# Patient Record
Sex: Male | Born: 1955
Health system: Southern US, Community
[De-identification: ages and names within clinical notes are randomized; demographics above are authoritative.]

## PROBLEM LIST (undated history)

## (undated) DIAGNOSIS — I10 Essential (primary) hypertension: Secondary | ICD-10-CM

## (undated) DIAGNOSIS — K5090 Crohn's disease, unspecified, without complications: Secondary | ICD-10-CM

## (undated) DIAGNOSIS — Z932 Ileostomy status: Secondary | ICD-10-CM

## (undated) DIAGNOSIS — Z9289 Personal history of other medical treatment: Secondary | ICD-10-CM

## (undated) DIAGNOSIS — N189 Chronic kidney disease, unspecified: Secondary | ICD-10-CM

## (undated) DIAGNOSIS — D6859 Other primary thrombophilia: Secondary | ICD-10-CM

## (undated) DIAGNOSIS — K610 Anal abscess: Secondary | ICD-10-CM

## (undated) DIAGNOSIS — H9190 Unspecified hearing loss, unspecified ear: Secondary | ICD-10-CM

## (undated) DIAGNOSIS — I8290 Acute embolism and thrombosis of unspecified vein: Secondary | ICD-10-CM

## (undated) DIAGNOSIS — K922 Gastrointestinal hemorrhage, unspecified: Secondary | ICD-10-CM

## (undated) DIAGNOSIS — I739 Peripheral vascular disease, unspecified: Secondary | ICD-10-CM

## (undated) DIAGNOSIS — D649 Anemia, unspecified: Secondary | ICD-10-CM

## (undated) DIAGNOSIS — E875 Hyperkalemia: Secondary | ICD-10-CM

## (undated) HISTORY — DX: Peripheral vascular disease, unspecified: I73.9

## (undated) HISTORY — DX: Anal abscess: K61.0

## (undated) HISTORY — DX: Gastrointestinal hemorrhage, unspecified: K92.2

## (undated) HISTORY — DX: Anemia, unspecified: D64.9

## (undated) HISTORY — DX: Essential (primary) hypertension: I10

## (undated) HISTORY — DX: Crohn's disease, unspecified, without complications: K50.90

---

## 1999-08-08 DIAGNOSIS — K610 Anal abscess: Secondary | ICD-10-CM

## 1999-08-08 HISTORY — DX: Anal abscess: K61.0

## 2000-06-07 ENCOUNTER — Emergency Department (HOSPITAL_COMMUNITY): Admission: EM | Admit: 2000-06-07 | Discharge: 2000-06-07 | Payer: Self-pay | Admitting: Unknown Physician Specialty

## 2000-08-07 HISTORY — PX: OTHER SURGICAL HISTORY: SHX169

## 2000-08-29 ENCOUNTER — Encounter: Payer: Self-pay | Admitting: Internal Medicine

## 2000-12-02 ENCOUNTER — Emergency Department (HOSPITAL_COMMUNITY): Admission: EM | Admit: 2000-12-02 | Discharge: 2000-12-02 | Payer: Self-pay | Admitting: Emergency Medicine

## 2000-12-25 ENCOUNTER — Ambulatory Visit (HOSPITAL_BASED_OUTPATIENT_CLINIC_OR_DEPARTMENT_OTHER): Admission: RE | Admit: 2000-12-25 | Discharge: 2000-12-25 | Payer: Self-pay | Admitting: General Surgery

## 2001-01-09 ENCOUNTER — Inpatient Hospital Stay (HOSPITAL_COMMUNITY): Admission: EM | Admit: 2001-01-09 | Discharge: 2001-01-23 | Payer: Self-pay | Admitting: Emergency Medicine

## 2001-01-09 ENCOUNTER — Encounter: Payer: Self-pay | Admitting: General Surgery

## 2001-01-09 ENCOUNTER — Encounter: Payer: Self-pay | Admitting: Emergency Medicine

## 2001-01-10 ENCOUNTER — Encounter: Payer: Self-pay | Admitting: General Surgery

## 2001-01-11 ENCOUNTER — Encounter: Payer: Self-pay | Admitting: General Surgery

## 2001-01-16 ENCOUNTER — Encounter: Payer: Self-pay | Admitting: General Surgery

## 2001-01-21 ENCOUNTER — Encounter: Payer: Self-pay | Admitting: General Surgery

## 2001-04-26 ENCOUNTER — Ambulatory Visit (HOSPITAL_COMMUNITY): Admission: RE | Admit: 2001-04-26 | Discharge: 2001-04-26 | Payer: Self-pay | Admitting: Gastroenterology

## 2001-04-26 ENCOUNTER — Encounter: Payer: Self-pay | Admitting: Gastroenterology

## 2001-08-25 ENCOUNTER — Encounter (INDEPENDENT_AMBULATORY_CARE_PROVIDER_SITE_OTHER): Payer: Self-pay | Admitting: Specialist

## 2001-08-25 ENCOUNTER — Inpatient Hospital Stay (HOSPITAL_COMMUNITY): Admission: EM | Admit: 2001-08-25 | Discharge: 2001-09-06 | Payer: Self-pay

## 2001-08-25 ENCOUNTER — Encounter: Payer: Self-pay | Admitting: Emergency Medicine

## 2001-08-27 ENCOUNTER — Encounter: Payer: Self-pay | Admitting: Emergency Medicine

## 2001-08-29 HISTORY — PX: HEMICOLECTOMY: SHX854

## 2004-12-27 ENCOUNTER — Encounter: Payer: Self-pay | Admitting: Internal Medicine

## 2004-12-27 LAB — CONVERTED CEMR LAB
Cholesterol: 247 mg/dL
Triglyceride fasting, serum: 153 mg/dL

## 2005-01-12 ENCOUNTER — Encounter: Payer: Self-pay | Admitting: Internal Medicine

## 2006-02-12 ENCOUNTER — Encounter: Payer: Self-pay | Admitting: Internal Medicine

## 2006-02-12 LAB — CONVERTED CEMR LAB
Cholesterol: 272 mg/dL
Triglyceride fasting, serum: 147 mg/dL

## 2008-02-26 ENCOUNTER — Encounter: Payer: Self-pay | Admitting: Internal Medicine

## 2008-02-26 LAB — CONVERTED CEMR LAB
ALT: 29 units/L
Cholesterol: 209 mg/dL
Creatinine, Ser: 1.02 mg/dL
Platelets: 309 10*3/uL
Triglycerides: 167 mg/dL
WBC, blood: 5.6 10*3/uL

## 2009-03-02 ENCOUNTER — Ambulatory Visit: Payer: Self-pay | Admitting: Internal Medicine

## 2009-03-02 DIAGNOSIS — R10814 Left lower quadrant abdominal tenderness: Secondary | ICD-10-CM

## 2009-03-02 DIAGNOSIS — I10 Essential (primary) hypertension: Secondary | ICD-10-CM | POA: Insufficient documentation

## 2009-03-02 DIAGNOSIS — H9190 Unspecified hearing loss, unspecified ear: Secondary | ICD-10-CM | POA: Insufficient documentation

## 2009-03-02 LAB — CONVERTED CEMR LAB
ALT: 18 units/L (ref 0–53)
AST: 13 units/L (ref 0–37)
Albumin: 2.9 g/dL — ABNORMAL LOW (ref 3.5–5.2)
Alkaline Phosphatase: 85 units/L (ref 39–117)
BUN: 14 mg/dL (ref 6–23)
Basophils Absolute: 0 10*3/uL (ref 0.0–0.1)
Basophils Relative: 0.1 % (ref 0.0–3.0)
Bilirubin, Direct: 0.1 mg/dL (ref 0.0–0.3)
CO2: 32 meq/L (ref 19–32)
Calcium: 8.7 mg/dL (ref 8.4–10.5)
Chloride: 91 meq/L — ABNORMAL LOW (ref 96–112)
Creatinine, Ser: 1 mg/dL (ref 0.4–1.5)
Eosinophils Absolute: 0.1 10*3/uL (ref 0.0–0.7)
Eosinophils Relative: 1.1 % (ref 0.0–5.0)
GFR calc non Af Amer: 101.43 mL/min (ref >60)
Glucose, Bld: 105 mg/dL — ABNORMAL HIGH (ref 70–99)
HCT: 39.6 % (ref 39.0–52.0)
Hemoglobin: 13.5 g/dL (ref 13.0–17.0)
Lymphocytes Relative: 17.8 % (ref 12.0–46.0)
Lymphs Abs: 1.5 10*3/uL (ref 0.7–4.0)
MCHC: 34.2 g/dL (ref 30.0–36.0)
MCV: 94.5 fL (ref 78.0–100.0)
Monocytes Absolute: 1.5 10*3/uL — ABNORMAL HIGH (ref 0.1–1.0)
Monocytes Relative: 18 % — ABNORMAL HIGH (ref 3.0–12.0)
Neutro Abs: 5.3 10*3/uL (ref 1.4–7.7)
Neutrophils Relative %: 63 % (ref 43.0–77.0)
Platelets: 670 10*3/uL — ABNORMAL HIGH (ref 150.0–400.0)
Potassium: 3.9 meq/L (ref 3.5–5.1)
RBC: 4.19 M/uL — ABNORMAL LOW (ref 4.22–5.81)
RDW: 11.9 % (ref 11.5–14.6)
Sodium: 132 meq/L — ABNORMAL LOW (ref 135–145)
Total Bilirubin: 0.7 mg/dL (ref 0.3–1.2)
Total Protein: 6.7 g/dL (ref 6.0–8.3)
Vitamin B-12: 577 pg/mL (ref 211–911)
WBC: 8.4 10*3/uL (ref 4.5–10.5)

## 2009-03-03 ENCOUNTER — Telehealth: Payer: Self-pay | Admitting: Internal Medicine

## 2009-03-04 ENCOUNTER — Encounter (INDEPENDENT_AMBULATORY_CARE_PROVIDER_SITE_OTHER): Payer: Self-pay | Admitting: *Deleted

## 2009-03-09 ENCOUNTER — Telehealth: Payer: Self-pay | Admitting: Internal Medicine

## 2009-03-19 ENCOUNTER — Ambulatory Visit: Payer: Self-pay | Admitting: Gastroenterology

## 2009-03-19 ENCOUNTER — Encounter (INDEPENDENT_AMBULATORY_CARE_PROVIDER_SITE_OTHER): Payer: Self-pay | Admitting: *Deleted

## 2009-03-19 DIAGNOSIS — K5010 Crohn's disease of large intestine without complications: Secondary | ICD-10-CM | POA: Insufficient documentation

## 2009-03-30 ENCOUNTER — Telehealth: Payer: Self-pay | Admitting: Gastroenterology

## 2009-04-06 ENCOUNTER — Ambulatory Visit: Payer: Self-pay | Admitting: Internal Medicine

## 2009-04-08 ENCOUNTER — Encounter: Payer: Self-pay | Admitting: Gastroenterology

## 2009-04-08 ENCOUNTER — Ambulatory Visit: Payer: Self-pay | Admitting: Gastroenterology

## 2009-04-14 ENCOUNTER — Encounter: Payer: Self-pay | Admitting: Gastroenterology

## 2009-05-03 ENCOUNTER — Telehealth: Payer: Self-pay | Admitting: Gastroenterology

## 2009-05-06 ENCOUNTER — Ambulatory Visit: Payer: Self-pay | Admitting: Gastroenterology

## 2009-05-06 DIAGNOSIS — D126 Benign neoplasm of colon, unspecified: Secondary | ICD-10-CM

## 2009-05-06 LAB — CONVERTED CEMR LAB
Basophils Absolute: 0.1 10*3/uL (ref 0.0–0.1)
Bilirubin, Direct: 0.1 mg/dL (ref 0.0–0.3)
Eosinophils Absolute: 0.2 10*3/uL (ref 0.0–0.7)
Eosinophils Relative: 2.6 % (ref 0.0–5.0)
HCT: 31.2 % — ABNORMAL LOW (ref 39.0–52.0)
Hemoglobin: 10.6 g/dL — ABNORMAL LOW (ref 13.0–17.0)
MCHC: 33.9 g/dL (ref 30.0–36.0)
Monocytes Absolute: 1.3 10*3/uL — ABNORMAL HIGH (ref 0.1–1.0)
Neutrophils Relative %: 58.4 % (ref 43.0–77.0)
Platelets: 743 10*3/uL — ABNORMAL HIGH (ref 150.0–400.0)
RBC: 3.49 M/uL — ABNORMAL LOW (ref 4.22–5.81)
RDW: 13.8 % (ref 11.5–14.6)
Total Bilirubin: 0.9 mg/dL (ref 0.3–1.2)
WBC: 7.3 10*3/uL (ref 4.5–10.5)

## 2009-05-10 ENCOUNTER — Encounter (INDEPENDENT_AMBULATORY_CARE_PROVIDER_SITE_OTHER): Payer: Self-pay | Admitting: *Deleted

## 2009-05-10 ENCOUNTER — Ambulatory Visit: Payer: Self-pay | Admitting: Internal Medicine

## 2009-05-11 ENCOUNTER — Encounter: Payer: Self-pay | Admitting: Internal Medicine

## 2009-05-13 ENCOUNTER — Telehealth: Payer: Self-pay | Admitting: Gastroenterology

## 2009-05-13 LAB — CONVERTED CEMR LAB
Basophils Absolute: 0.1 10*3/uL (ref 0.0–0.1)
Eosinophils Relative: 6 % — ABNORMAL HIGH (ref 0.0–5.0)
Lymphs Abs: 1 10*3/uL (ref 0.7–4.0)
MCV: 88.3 fL (ref 78.0–100.0)
Monocytes Relative: 31 % — ABNORMAL HIGH (ref 3.0–12.0)
Uric Acid, Serum: 8.3 mg/dL — ABNORMAL HIGH (ref 4.0–7.8)
WBC: 2.7 10*3/uL — ABNORMAL LOW (ref 4.5–10.5)

## 2009-05-17 ENCOUNTER — Ambulatory Visit: Payer: Self-pay | Admitting: Gastroenterology

## 2009-05-17 LAB — CONVERTED CEMR LAB
Basophils Absolute: 0.1 10*3/uL (ref 0.0–0.1)
Basophils Relative: 0.8 % (ref 0.0–3.0)
Bilirubin, Direct: 0.1 mg/dL (ref 0.0–0.3)
Eosinophils Relative: 2.7 % (ref 0.0–5.0)
Hemoglobin: 10.4 g/dL — ABNORMAL LOW (ref 13.0–17.0)
Lymphocytes Relative: 19.9 % (ref 12.0–46.0)
MCV: 88 fL (ref 78.0–100.0)
Neutro Abs: 4.8 10*3/uL (ref 1.4–7.7)
Neutrophils Relative %: 62.1 % (ref 43.0–77.0)
RBC: 3.52 M/uL — ABNORMAL LOW (ref 4.22–5.81)
Total Protein: 6.4 g/dL (ref 6.0–8.3)
WBC: 7.7 10*3/uL (ref 4.5–10.5)

## 2009-05-18 ENCOUNTER — Encounter: Payer: Self-pay | Admitting: Internal Medicine

## 2009-05-18 ENCOUNTER — Telehealth: Payer: Self-pay | Admitting: Gastroenterology

## 2009-05-21 ENCOUNTER — Ambulatory Visit: Payer: Self-pay | Admitting: Gastroenterology

## 2009-05-21 LAB — CONVERTED CEMR LAB
Basophils Relative: 0.3 % (ref 0.0–3.0)
HCT: 30 % — ABNORMAL LOW (ref 39.0–52.0)
Hemoglobin: 10.1 g/dL — ABNORMAL LOW (ref 13.0–17.0)
Lymphocytes Relative: 18.3 % (ref 12.0–46.0)
Lymphs Abs: 1.4 10*3/uL (ref 0.7–4.0)
MCHC: 33.7 g/dL (ref 30.0–36.0)
Monocytes Relative: 15.2 % — ABNORMAL HIGH (ref 3.0–12.0)
Neutro Abs: 5.1 10*3/uL (ref 1.4–7.7)
RBC: 3.47 M/uL — ABNORMAL LOW (ref 4.22–5.81)

## 2009-06-03 ENCOUNTER — Ambulatory Visit: Payer: Self-pay | Admitting: Gastroenterology

## 2009-06-03 LAB — CONVERTED CEMR LAB
ALT: 12 units/L (ref 0–53)
Basophils Absolute: 0.1 10*3/uL (ref 0.0–0.1)
Bilirubin, Direct: 0.2 mg/dL (ref 0.0–0.3)
Eosinophils Absolute: 0.2 10*3/uL (ref 0.0–0.7)
HCT: 30.9 % — ABNORMAL LOW (ref 39.0–52.0)
Lymphs Abs: 1.4 10*3/uL (ref 0.7–4.0)
MCHC: 33 g/dL (ref 30.0–36.0)
MCV: 86.5 fL (ref 78.0–100.0)
Monocytes Absolute: 0.9 10*3/uL (ref 0.1–1.0)
Neutrophils Relative %: 66.4 % (ref 43.0–77.0)
Platelets: 939 10*3/uL — ABNORMAL HIGH (ref 150.0–400.0)
RDW: 14.9 % — ABNORMAL HIGH (ref 11.5–14.6)
Total Bilirubin: 0.8 mg/dL (ref 0.3–1.2)

## 2009-06-21 ENCOUNTER — Ambulatory Visit: Payer: Self-pay | Admitting: Family

## 2009-06-21 ENCOUNTER — Telehealth: Payer: Self-pay | Admitting: Family

## 2009-06-21 ENCOUNTER — Telehealth: Payer: Self-pay | Admitting: Gastroenterology

## 2009-06-21 ENCOUNTER — Telehealth: Payer: Self-pay | Admitting: Family Medicine

## 2009-06-21 LAB — CONVERTED CEMR LAB
BUN: 7 mg/dL (ref 6–23)
Chloride: 85 meq/L — ABNORMAL LOW (ref 96–112)
GFR calc non Af Amer: 131.05 mL/min (ref >60)
Glucose, Bld: 97 mg/dL (ref 70–99)
Potassium: 2.7 meq/L — CL (ref 3.5–5.1)

## 2009-06-23 ENCOUNTER — Encounter: Payer: Self-pay | Admitting: Family Medicine

## 2009-06-23 ENCOUNTER — Ambulatory Visit: Payer: Self-pay | Admitting: Family

## 2009-06-23 ENCOUNTER — Telehealth: Payer: Self-pay | Admitting: Family

## 2009-06-23 LAB — CONVERTED CEMR LAB
Chloride: 89 meq/L — ABNORMAL LOW (ref 96–112)
Creatinine, Ser: 0.7 mg/dL (ref 0.4–1.5)
Potassium: 3 meq/L — ABNORMAL LOW (ref 3.5–5.1)
Sodium: 136 meq/L (ref 135–145)

## 2009-06-24 ENCOUNTER — Ambulatory Visit: Payer: Self-pay | Admitting: Gastroenterology

## 2009-06-24 ENCOUNTER — Telehealth (INDEPENDENT_AMBULATORY_CARE_PROVIDER_SITE_OTHER): Payer: Self-pay | Admitting: *Deleted

## 2009-06-25 ENCOUNTER — Encounter: Payer: Self-pay | Admitting: Gastroenterology

## 2009-06-25 ENCOUNTER — Telehealth: Payer: Self-pay | Admitting: Family

## 2009-06-25 ENCOUNTER — Ambulatory Visit: Payer: Self-pay | Admitting: Internal Medicine

## 2009-06-25 ENCOUNTER — Encounter: Payer: Self-pay | Admitting: Family Medicine

## 2009-06-25 LAB — CONVERTED CEMR LAB
AST: 11 units/L (ref 0–37)
Albumin: 2 g/dL — ABNORMAL LOW (ref 3.5–5.2)
Basophils Absolute: 0 10*3/uL (ref 0.0–0.1)
Eosinophils Relative: 1.8 % (ref 0.0–5.0)
HCT: 27.3 % — ABNORMAL LOW (ref 39.0–52.0)
Hemoglobin: 8.9 g/dL — ABNORMAL LOW (ref 13.0–17.0)
Lymphs Abs: 1.3 10*3/uL (ref 0.7–4.0)
Monocytes Relative: 11.5 % (ref 3.0–12.0)
Platelets: 702 10*3/uL — ABNORMAL HIGH (ref 150.0–400.0)
RBC: 3.21 M/uL — ABNORMAL LOW (ref 4.22–5.81)
WBC: 5.8 10*3/uL (ref 4.5–10.5)

## 2009-06-28 ENCOUNTER — Ambulatory Visit: Payer: Self-pay | Admitting: Gastroenterology

## 2009-06-28 LAB — CONVERTED CEMR LAB
AST: 9 units/L (ref 0–37)
Alkaline Phosphatase: 59 units/L (ref 39–117)
Basophils Absolute: 0 10*3/uL (ref 0.0–0.1)
Basophils Relative: 0.3 % (ref 0.0–3.0)
Bilirubin, Direct: 0.1 mg/dL (ref 0.0–0.3)
Eosinophils Absolute: 0 10*3/uL (ref 0.0–0.7)
Hemoglobin: 8.7 g/dL — ABNORMAL LOW (ref 13.0–17.0)
MCHC: 32.7 g/dL (ref 30.0–36.0)
MCV: 85.9 fL (ref 78.0–100.0)
Monocytes Absolute: 0.9 10*3/uL (ref 0.1–1.0)
Neutrophils Relative %: 64.1 % (ref 43.0–77.0)
Platelets: 740 10*3/uL — ABNORMAL HIGH (ref 150.0–400.0)
RDW: 17.6 % — ABNORMAL HIGH (ref 11.5–14.6)
Total Bilirubin: 0.5 mg/dL (ref 0.3–1.2)
Transferrin: 100.3 mg/dL — ABNORMAL LOW (ref 212.0–360.0)
WBC: 8 10*3/uL (ref 4.5–10.5)

## 2009-06-29 ENCOUNTER — Ambulatory Visit: Payer: Self-pay | Admitting: Internal Medicine

## 2009-06-30 ENCOUNTER — Telehealth (INDEPENDENT_AMBULATORY_CARE_PROVIDER_SITE_OTHER): Payer: Self-pay | Admitting: *Deleted

## 2009-06-30 LAB — CONVERTED CEMR LAB
Creatinine, Ser: 0.8 mg/dL (ref 0.4–1.5)
PSA: 2.24 ng/mL (ref 0.10–4.00)
Potassium: 3.6 meq/L (ref 3.5–5.1)
Sodium: 140 meq/L (ref 135–145)
TSH: 1.1 microintl units/mL (ref 0.35–5.50)

## 2009-07-06 ENCOUNTER — Ambulatory Visit: Payer: Self-pay | Admitting: Gastroenterology

## 2009-07-12 ENCOUNTER — Telehealth: Payer: Self-pay | Admitting: Gastroenterology

## 2009-07-12 ENCOUNTER — Telehealth (INDEPENDENT_AMBULATORY_CARE_PROVIDER_SITE_OTHER): Payer: Self-pay | Admitting: *Deleted

## 2009-07-13 ENCOUNTER — Telehealth (INDEPENDENT_AMBULATORY_CARE_PROVIDER_SITE_OTHER): Payer: Self-pay | Admitting: *Deleted

## 2009-07-17 ENCOUNTER — Telehealth: Payer: Self-pay | Admitting: Family Medicine

## 2009-07-19 ENCOUNTER — Telehealth: Payer: Self-pay | Admitting: Gastroenterology

## 2009-07-23 ENCOUNTER — Ambulatory Visit: Payer: Self-pay | Admitting: Family

## 2009-08-04 ENCOUNTER — Telehealth: Payer: Self-pay | Admitting: Gastroenterology

## 2009-08-09 ENCOUNTER — Telehealth: Payer: Self-pay | Admitting: Gastroenterology

## 2009-08-12 ENCOUNTER — Ambulatory Visit: Payer: Self-pay | Admitting: Gastroenterology

## 2009-08-12 LAB — CONVERTED CEMR LAB
Basophils Relative: 0.6 % (ref 0.0–3.0)
Eosinophils Relative: 1.1 % (ref 0.0–5.0)
HCT: 32.4 % — ABNORMAL LOW (ref 39.0–52.0)
Lymphs Abs: 1.6 10*3/uL (ref 0.7–4.0)
MCV: 95 fL (ref 78.0–100.0)
Monocytes Absolute: 1 10*3/uL (ref 0.1–1.0)
RBC: 3.41 M/uL — ABNORMAL LOW (ref 4.22–5.81)
WBC: 6.8 10*3/uL (ref 4.5–10.5)

## 2009-08-21 ENCOUNTER — Emergency Department (HOSPITAL_COMMUNITY): Admission: EM | Admit: 2009-08-21 | Discharge: 2009-08-21 | Payer: Self-pay | Admitting: Emergency Medicine

## 2009-08-25 ENCOUNTER — Ambulatory Visit: Payer: Self-pay | Admitting: Internal Medicine

## 2009-08-25 LAB — CONVERTED CEMR LAB
BUN: 14 mg/dL (ref 6–23)
CO2: 28 meq/L (ref 19–32)
Glucose, Bld: 94 mg/dL (ref 70–99)
Sodium: 137 meq/L (ref 135–145)

## 2009-08-30 ENCOUNTER — Telehealth (INDEPENDENT_AMBULATORY_CARE_PROVIDER_SITE_OTHER): Payer: Self-pay | Admitting: *Deleted

## 2009-09-03 ENCOUNTER — Telehealth: Payer: Self-pay | Admitting: Gastroenterology

## 2009-09-08 ENCOUNTER — Ambulatory Visit: Payer: Self-pay | Admitting: Gastroenterology

## 2009-09-08 ENCOUNTER — Telehealth: Payer: Self-pay | Admitting: Gastroenterology

## 2009-09-08 DIAGNOSIS — D638 Anemia in other chronic diseases classified elsewhere: Secondary | ICD-10-CM

## 2009-09-08 LAB — CONVERTED CEMR LAB
Basophils Absolute: 0 10*3/uL (ref 0.0–0.1)
Eosinophils Absolute: 0.1 10*3/uL (ref 0.0–0.7)
Lymphocytes Relative: 27.2 % (ref 12.0–46.0)
MCHC: 31.5 g/dL (ref 30.0–36.0)
Neutro Abs: 3.7 10*3/uL (ref 1.4–7.7)
Neutrophils Relative %: 56.7 % (ref 43.0–77.0)
RDW: 16.1 % — ABNORMAL HIGH (ref 11.5–14.6)

## 2009-09-09 ENCOUNTER — Telehealth: Payer: Self-pay | Admitting: Gastroenterology

## 2009-09-29 ENCOUNTER — Ambulatory Visit: Payer: Self-pay | Admitting: Internal Medicine

## 2009-09-29 DIAGNOSIS — F528 Other sexual dysfunction not due to a substance or known physiological condition: Secondary | ICD-10-CM

## 2009-09-30 LAB — CONVERTED CEMR LAB
BUN: 6 mg/dL (ref 6–23)
CO2: 32 meq/L (ref 19–32)
Chloride: 105 meq/L (ref 96–112)
Glucose, Bld: 92 mg/dL (ref 70–99)
Potassium: 3.7 meq/L (ref 3.5–5.1)

## 2009-10-07 ENCOUNTER — Telehealth: Payer: Self-pay | Admitting: Gastroenterology

## 2009-10-13 ENCOUNTER — Ambulatory Visit: Payer: Self-pay | Admitting: Internal Medicine

## 2009-10-14 ENCOUNTER — Ambulatory Visit: Payer: Self-pay | Admitting: Gastroenterology

## 2009-10-14 LAB — CONVERTED CEMR LAB
Basophils Absolute: 0 10*3/uL (ref 0.0–0.1)
Eosinophils Absolute: 0.1 10*3/uL (ref 0.0–0.7)
Lymphocytes Relative: 20.2 % (ref 12.0–46.0)
MCHC: 31.9 g/dL (ref 30.0–36.0)
Monocytes Relative: 10.9 % (ref 3.0–12.0)
Neutrophils Relative %: 66.4 % (ref 43.0–77.0)
Platelets: 680 10*3/uL — ABNORMAL HIGH (ref 150.0–400.0)
RDW: 15.4 % — ABNORMAL HIGH (ref 11.5–14.6)
WBC: 5.9 10*3/uL (ref 4.5–10.5)

## 2009-11-03 ENCOUNTER — Encounter (INDEPENDENT_AMBULATORY_CARE_PROVIDER_SITE_OTHER): Payer: Self-pay | Admitting: *Deleted

## 2009-12-03 ENCOUNTER — Telehealth (INDEPENDENT_AMBULATORY_CARE_PROVIDER_SITE_OTHER): Payer: Self-pay | Admitting: *Deleted

## 2009-12-06 ENCOUNTER — Telehealth: Payer: Self-pay | Admitting: Gastroenterology

## 2010-01-05 DIAGNOSIS — K922 Gastrointestinal hemorrhage, unspecified: Secondary | ICD-10-CM

## 2010-01-05 HISTORY — DX: Gastrointestinal hemorrhage, unspecified: K92.2

## 2010-01-21 ENCOUNTER — Encounter: Payer: Self-pay | Admitting: Gastroenterology

## 2010-01-21 ENCOUNTER — Inpatient Hospital Stay (HOSPITAL_COMMUNITY): Admission: EM | Admit: 2010-01-21 | Discharge: 2010-01-24 | Payer: Self-pay | Admitting: Emergency Medicine

## 2010-01-21 ENCOUNTER — Telehealth (INDEPENDENT_AMBULATORY_CARE_PROVIDER_SITE_OTHER): Payer: Self-pay | Admitting: *Deleted

## 2010-01-21 ENCOUNTER — Telehealth: Payer: Self-pay | Admitting: Gastroenterology

## 2010-01-22 ENCOUNTER — Encounter (INDEPENDENT_AMBULATORY_CARE_PROVIDER_SITE_OTHER): Payer: Self-pay | Admitting: *Deleted

## 2010-01-23 ENCOUNTER — Encounter (INDEPENDENT_AMBULATORY_CARE_PROVIDER_SITE_OTHER): Payer: Self-pay | Admitting: *Deleted

## 2010-01-25 ENCOUNTER — Encounter (INDEPENDENT_AMBULATORY_CARE_PROVIDER_SITE_OTHER): Payer: Self-pay | Admitting: *Deleted

## 2010-01-25 ENCOUNTER — Telehealth (INDEPENDENT_AMBULATORY_CARE_PROVIDER_SITE_OTHER): Payer: Self-pay | Admitting: *Deleted

## 2010-01-28 ENCOUNTER — Telehealth: Payer: Self-pay | Admitting: Internal Medicine

## 2010-02-02 ENCOUNTER — Ambulatory Visit: Payer: Self-pay | Admitting: Internal Medicine

## 2010-02-04 ENCOUNTER — Telehealth: Payer: Self-pay | Admitting: Internal Medicine

## 2010-02-04 DIAGNOSIS — D759 Disease of blood and blood-forming organs, unspecified: Secondary | ICD-10-CM | POA: Insufficient documentation

## 2010-02-04 LAB — CONVERTED CEMR LAB
Basophils Relative: 0.7 % (ref 0.0–3.0)
Eosinophils Relative: 1.3 % (ref 0.0–5.0)
HCT: 32.7 % — ABNORMAL LOW (ref 39.0–52.0)
Hemoglobin: 10.4 g/dL — ABNORMAL LOW (ref 13.0–17.0)
Lymphocytes Relative: 14.4 % (ref 12.0–46.0)
Lymphs Abs: 1.2 10*3/uL (ref 0.7–4.0)
Monocytes Relative: 11 % (ref 3.0–12.0)
Neutro Abs: 6 10*3/uL (ref 1.4–7.7)
RBC: 3.98 M/uL — ABNORMAL LOW (ref 4.22–5.81)
RDW: 19.4 % — ABNORMAL HIGH (ref 11.5–14.6)

## 2010-02-11 ENCOUNTER — Ambulatory Visit: Payer: Self-pay | Admitting: Gastroenterology

## 2010-02-16 ENCOUNTER — Ambulatory Visit: Payer: Self-pay | Admitting: Internal Medicine

## 2010-02-21 ENCOUNTER — Encounter: Payer: Self-pay | Admitting: Gastroenterology

## 2010-02-21 LAB — CBC WITH DIFFERENTIAL/PLATELET
BASO%: 0.3 % (ref 0.0–2.0)
EOS%: 2.6 % (ref 0.0–7.0)
HCT: 32.4 % — ABNORMAL LOW (ref 38.4–49.9)
LYMPH%: 20 % (ref 14.0–49.0)
MCH: 25.7 pg — ABNORMAL LOW (ref 27.2–33.4)
MCHC: 32.4 g/dL (ref 32.0–36.0)
MONO#: 0.6 10*3/uL (ref 0.1–0.9)
MONO%: 7.1 % (ref 0.0–14.0)
NEUT%: 70 % (ref 39.0–75.0)
Platelets: 855 10*3/uL — ABNORMAL HIGH (ref 140–400)
RBC: 4.08 10*6/uL — ABNORMAL LOW (ref 4.20–5.82)
WBC: 8.6 10*3/uL (ref 4.0–10.3)

## 2010-02-22 LAB — COMPREHENSIVE METABOLIC PANEL
ALT: <8 U/L (ref 0–53)
AST: 8 U/L (ref 0–37)
Alkaline Phosphatase: 72 U/L (ref 39–117)
CO2: 26 mEq/L (ref 19–32)
Creatinine, Ser: 0.8 mg/dL (ref 0.40–1.50)
Sodium: 140 mEq/L (ref 135–145)
Total Bilirubin: 0.4 mg/dL (ref 0.3–1.2)
Total Protein: 6.1 g/dL (ref 6.0–8.3)

## 2010-02-22 LAB — IRON AND TIBC
%SAT: 10 % — ABNORMAL LOW (ref 20–55)
TIBC: 277 ug/dL (ref 215–435)

## 2010-02-22 LAB — FERRITIN: Ferritin: 11 ng/mL — ABNORMAL LOW (ref 22–322)

## 2010-02-22 LAB — LACTATE DEHYDROGENASE: LDH: 115 U/L (ref 94–250)

## 2010-03-29 ENCOUNTER — Ambulatory Visit: Payer: Self-pay | Admitting: Internal Medicine

## 2010-03-29 ENCOUNTER — Encounter (INDEPENDENT_AMBULATORY_CARE_PROVIDER_SITE_OTHER): Payer: Self-pay | Admitting: *Deleted

## 2010-04-18 ENCOUNTER — Ambulatory Visit: Payer: Self-pay | Admitting: Internal Medicine

## 2010-04-18 LAB — CBC & DIFF AND RETIC
Eosinophils Absolute: 0.1 10*3/uL (ref 0.0–0.5)
HCT: 27.4 % — ABNORMAL LOW (ref 38.4–49.9)
Immature Retic Fract: 10.4 % (ref 0.00–13.40)
LYMPH%: 14.1 % (ref 14.0–49.0)
MONO#: 1 10*3/uL — ABNORMAL HIGH (ref 0.1–0.9)
NEUT#: 4.3 10*3/uL (ref 1.5–6.5)
NEUT%: 67.8 % (ref 39.0–75.0)
Platelets: 558 10*3/uL — ABNORMAL HIGH (ref 140–400)
RBC: 3.6 10*6/uL — ABNORMAL LOW (ref 4.20–5.82)
Retic %: 0.58 % (ref 0.50–1.60)
WBC: 6.3 10*3/uL (ref 4.0–10.3)
lymph#: 0.9 10*3/uL (ref 0.9–3.3)

## 2010-04-19 ENCOUNTER — Ambulatory Visit: Payer: Self-pay | Admitting: Internal Medicine

## 2010-04-19 ENCOUNTER — Encounter (INDEPENDENT_AMBULATORY_CARE_PROVIDER_SITE_OTHER): Payer: Self-pay | Admitting: *Deleted

## 2010-04-19 LAB — CONVERTED CEMR LAB
Blood in Urine, dipstick: NEGATIVE
Glucose, Urine, Semiquant: NEGATIVE
Nitrite: NEGATIVE
Protein, U semiquant: NEGATIVE
WBC Urine, dipstick: NEGATIVE
pH: 7

## 2010-04-20 ENCOUNTER — Ambulatory Visit: Payer: Self-pay | Admitting: Internal Medicine

## 2010-04-20 LAB — FERRITIN: Ferritin: 97 ng/mL (ref 22–322)

## 2010-04-20 LAB — PROTEIN ELECTROPHORESIS, SERUM
Gamma Globulin: 19.1 % — ABNORMAL HIGH (ref 11.1–18.8)
Total Protein, Serum Electrophoresis: 5.3 g/dL — ABNORMAL LOW (ref 6.0–8.3)

## 2010-04-20 LAB — IRON AND TIBC
Iron: <10 ug/dL — ABNORMAL LOW (ref 42–165)
UIBC: 128 ug/dL

## 2010-04-24 ENCOUNTER — Telehealth: Payer: Self-pay | Admitting: Internal Medicine

## 2010-04-25 ENCOUNTER — Encounter: Payer: Self-pay | Admitting: Gastroenterology

## 2010-04-25 ENCOUNTER — Ambulatory Visit: Payer: Self-pay | Admitting: Internal Medicine

## 2010-04-27 LAB — CONVERTED CEMR LAB
Basophils Absolute: 0 10*3/uL (ref 0.0–0.1)
Basophils Relative: 0 % (ref 0.0–3.0)
Eosinophils Absolute: 0.1 10*3/uL (ref 0.0–0.7)
Folate: 13.3 ng/mL
HCT: 28 % — ABNORMAL LOW (ref 39.0–52.0)
Hemoglobin: 8.8 g/dL — ABNORMAL LOW (ref 13.0–17.0)
Lymphs Abs: 1.4 10*3/uL (ref 0.7–4.0)
MCHC: 31.2 g/dL (ref 30.0–36.0)
Neutro Abs: 4 10*3/uL (ref 1.4–7.7)
RDW: 18.5 % — ABNORMAL HIGH (ref 11.5–14.6)
Vitamin B-12: 830 pg/mL (ref 211–911)

## 2010-05-03 ENCOUNTER — Telehealth (INDEPENDENT_AMBULATORY_CARE_PROVIDER_SITE_OTHER): Payer: Self-pay | Admitting: *Deleted

## 2010-05-04 ENCOUNTER — Ambulatory Visit: Payer: Self-pay | Admitting: Hematology & Oncology

## 2010-05-09 ENCOUNTER — Telehealth: Payer: Self-pay | Admitting: Gastroenterology

## 2010-05-09 ENCOUNTER — Inpatient Hospital Stay (HOSPITAL_COMMUNITY): Admission: EM | Admit: 2010-05-09 | Discharge: 2010-05-13 | Payer: Self-pay | Admitting: Emergency Medicine

## 2010-05-09 ENCOUNTER — Ambulatory Visit: Payer: Self-pay | Admitting: Internal Medicine

## 2010-05-09 LAB — CONVERTED CEMR LAB: Hemoglobin: 7 g/dL

## 2010-05-16 ENCOUNTER — Encounter: Payer: Self-pay | Admitting: Internal Medicine

## 2010-05-18 ENCOUNTER — Ambulatory Visit: Payer: Self-pay | Admitting: Internal Medicine

## 2010-05-18 ENCOUNTER — Encounter (INDEPENDENT_AMBULATORY_CARE_PROVIDER_SITE_OTHER): Payer: Self-pay | Admitting: *Deleted

## 2010-05-20 ENCOUNTER — Telehealth: Payer: Self-pay | Admitting: Internal Medicine

## 2010-05-20 ENCOUNTER — Telehealth: Payer: Self-pay | Admitting: Gastroenterology

## 2010-05-20 LAB — CONVERTED CEMR LAB
BUN: 12 mg/dL (ref 6–23)
CO2: 33 meq/L — ABNORMAL HIGH (ref 19–32)
Chloride: 99 meq/L (ref 96–112)
Creatinine, Ser: 0.8 mg/dL (ref 0.4–1.5)
Glucose, Bld: 85 mg/dL (ref 70–99)
Potassium: 4.6 meq/L (ref 3.5–5.1)

## 2010-06-02 ENCOUNTER — Telehealth: Payer: Self-pay | Admitting: Gastroenterology

## 2010-06-22 ENCOUNTER — Ambulatory Visit: Payer: Self-pay | Admitting: Internal Medicine

## 2010-07-04 ENCOUNTER — Encounter: Payer: Self-pay | Admitting: Internal Medicine

## 2010-07-04 LAB — CBC WITH DIFFERENTIAL/PLATELET
Basophils Absolute: 0 10*3/uL (ref 0.0–0.1)
Eosinophils Absolute: 0 10*3/uL (ref 0.0–0.5)
HCT: 32.8 % — ABNORMAL LOW (ref 38.4–49.9)
HGB: 10.9 g/dL — ABNORMAL LOW (ref 13.0–17.1)
LYMPH%: 15 % (ref 14.0–49.0)
MCV: 92.8 fL (ref 79.3–98.0)
MONO#: 0.5 10*3/uL (ref 0.1–0.9)
NEUT#: 4.9 10*3/uL (ref 1.5–6.5)
Platelets: 753 10*3/uL — ABNORMAL HIGH (ref 140–400)
RBC: 3.53 10*6/uL — ABNORMAL LOW (ref 4.20–5.82)
WBC: 6.4 10*3/uL (ref 4.0–10.3)

## 2010-07-04 LAB — FERRITIN: Ferritin: 66 ng/mL (ref 22–322)

## 2010-07-04 LAB — IRON AND TIBC
%SAT: 8 % — ABNORMAL LOW (ref 20–55)
TIBC: 220 ug/dL (ref 215–435)

## 2010-07-04 LAB — VITAMIN B12: Vitamin B-12: 352 pg/mL (ref 211–911)

## 2010-07-07 LAB — JAK-2 V617F

## 2010-08-09 ENCOUNTER — Encounter
Admission: RE | Admit: 2010-08-09 | Discharge: 2010-08-09 | Payer: Self-pay | Source: Home / Self Care | Attending: Gastroenterology | Admitting: Gastroenterology

## 2010-08-27 ENCOUNTER — Encounter: Payer: Self-pay | Admitting: Gastroenterology

## 2010-09-06 NOTE — Progress Notes (Signed)
Summary: 1 Month lab reminder  Phone Note Outgoing Call Call back at [facility] Phone [phone]   Call placed by: Merri Ray CMA Duncan Dull),  August 09, 2009 10:16 AM Action Taken: Appt scheduled Summary of Call: Reminder for pt to come in and have his 1 month follow up labs drawn. Orders in IDX. Pt stated he would come in Thurs or Friday. Initial call taken by: Merri Ray CMA [facility]),  August 09, 2009 10:17 AM

## 2010-09-06 NOTE — Assessment & Plan Note (Signed)
Summary: chrons flare up//started iron supplement//lch   Vital Signs:  Patient profile:   55 year old male Weight:      150.25 pounds Pulse rate:   89 / minute Pulse rhythm:   regular BP sitting:   122 / 80  (left arm) Cuff size:   regular  Vitals Entered By: Army Fossa CMA (April 19, 2010 10:50 AM)  [facility]: Abdominal issues. Comments States he can only eat a very small amt and he feels full. No appetite.  Diarrhea not often. Arms feel tight/numb Feels like he has mood swings.    History of Present Illness:  here with his father Complaining of  upper right-sided abdominal "swelling", and discomfort and bubbling for while. he is able to "push back" the swelling with his fingers. He also sometimes has lower abdominal discomfort and bubbling.  occ.  he has chills He feels funny when  he takes iron supplements ( by "funny"   he means....  numbness in his arms?) consequently he discontinue the iron  ROS No fever No nausea vomiting No blood in the stools Sometimes has mild diarrhea appetite has been decreased lately but he is able to keep food down. + early satiety Denies dysuria, gross hematuria. No cough  Current Medications (verified): 1)  Azathioprine 50 Mg Tabs (Azathioprine) .... Take 1/2 Tablet By Mouth Once Daily 2)  Metoprolol Tartrate 50 Mg Tabs (Metoprolol Tartrate) .Marland Kitchen.. 1 1/2 By Mouth Bid 3)  Spironolactone 25 Mg Tabs (Spironolactone) .Marland Kitchen.. 1 By Mouth Once Daily 4)  Mvi 5)  Tramadol Hcl 50 Mg Tabs (Tramadol Hcl) .Marland Kitchen.. 1 Every 6 Hrs As Needed For Ear Pain 6)  Folivane-Plus  Caps (Fefum-Fepoly-Fa-B Cmp-C-Biot) .... Qd  Allergies: 1)  ! * Asacol  Past History:  Past Medical History: Reviewed history from 02/02/2010 and no changes required. CROHNS DISEASE ;S/P PERIANAL ABSCESS 2001,S/P RIGHT HEMICOLECTOMY ,AND DRAINAGE OF RETROPERITONEAL ABSCESS 2003 Hypertension hearing loss upper GI bleed with normal EGD  6-11, 3 PRBCs  Past Surgical  History: Reviewed history from 03/19/2009 and no changes required. rt hemicolectomy 08/29/2001 (perforated abscess) ANAL FISTULOTOMY 2002  Social History: Reviewed history from 02/02/2010 and no changes required. 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   Physical Exam  General:  alert and well-developed.  alert and well-developed.   Lungs:  Normal respiratory effort, chest expands symmetrically. Lungs are clear to auscultation, no crackles or wheezes.  Heart:  Normal rate and regular rhythm. S1 and S2 normal without gallop, murmur, click, rub . S4 Abdomen:  snot distended, no mass, minimal if any diffuse tenderness. He has a well-healed surgical scar. He has a 3 cm umbilical hernia. no hernias anywhere else in the abdomen.  Extremities:  no edema   Impression & Recommendations:  Problem # 1:  CROHN'S DISEASE-LARGE INTESTINE (ICD-555.1) Assessment Deteriorated patient presents with somehow atypical symptoms for  Crohn's exacerbation intermittent SBO? no evidence of a hernia plan: Will check basic labs XR  will also  discuss with GI.....? Bentyl consider a gallbladder ultrasound addendum---discuss with GI via flag , apparently Bentyl has been tried before.  they  will be happy to reassess him.  Orders: TLB-Hepatic/Liver Function Pnl (80076-HEPATIC) T-Acute Abdomen (2 view w/ PA & Chest (60454UJ) UA Dipstick w/o Micro (automated)  (81003)  Problem # 2:  ANEMIA OF CHRONIC DISEASE (ICD-285.29) Assessment: Deteriorated has a history of anemia,  intolerant to iron by mouth ( although symptoms are atypical) Plan:  Labs refer to hematology for IV therapy? His updated medication list for this problem includes:    Folivane-plus Caps (Fefum-fepoly-fa-b cmp-c-biot) ..... Qd  Hgb: 10.4 (02/02/2010)   Hct: 32.7 (02/02/2010)   Platelets: 800.0  (02/02/2010) RBC: 3.98 (02/02/2010)   RDW: 19.4 (02/02/2010)   WBC: 8.3 (02/02/2010) MCV: 82.1 (02/02/2010)   MCHC: 31.7 (02/02/2010) Iron: 26 (06/28/2009)   % Sat: 18.5 (06/28/2009) B12: 732 (06/28/2009)   Folate: 4.8 (06/28/2009)   TSH: 1.10 (06/29/2009)  Orders: Venipuncture (42595) TLB-CBC Platelet - w/Differential (85025-CBCD) TLB-IBC Pnl (Iron/FE;Transferrin) (83550-IBC) TLB-B12 + Folate Pnl (63875_64332-R51/OAC)  Complete Medication List: 1)  Azathioprine 50 Mg Tabs (Azathioprine) .... Take 1/2 tablet by mouth once daily 2)  Metoprolol Tartrate 50 Mg Tabs (Metoprolol tartrate) .Marland Kitchen.. 1 1/2 by mouth bid 3)  Spironolactone 25 Mg Tabs (Spironolactone) .Marland Kitchen.. 1 by mouth once daily 4)  Mvi  5)  Tramadol Hcl 50 Mg Tabs (Tramadol hcl) .Marland Kitchen.. 1 every 6 hrs as needed for ear pain 6)  Folivane-plus Caps (Fefum-fepoly-fa-b cmp-c-biot) .... Qd  Laboratory Results   Urine Tests    Routine Urinalysis   Color: orange Appearance: Clear Glucose: negative   (Normal Range: Negative) Bilirubin: small   (Normal Range: Negative) Ketone: negative   (Normal Range: Negative) Spec. Gravity: 1.025   (Normal Range: 1.003-1.035) Blood: negative   (Normal Range: Negative) pH: 7.0   (Normal Range: 5.0-8.0) Protein: negative   (Normal Range: Negative) Urobilinogen: 0.2   (Normal Range: 0-1) Nitrite: negative   (Normal Range: Negative) Leukocyte Esterace: negative   (Normal Range: Negative)    Comments: Army Fossa CMA  April 19, 2010 11:31 AM

## 2010-09-06 NOTE — Assessment & Plan Note (Signed)
Summary: f/u hosp Crohn's flare up   Vital Signs:  Patient profile:   55 year old male Weight:      147.38 pounds Pulse rate:   124 / minute BP sitting:   136 / 86  (left arm) Cuff size:   regular  Vitals Entered By: Army Fossa CMA (May 18, 2010 10:28 AM) CC: Hospital f/u. not fasting Comments [facility] [address]    History of Present Illness: Hospital followup, chart reviewed   DATE OF ADMISSION:  05/09/2010   DATE OF DISCHARGE:  05/13/2010     DISCHARGE DIAGNOSES:   1. Acute Crohn's flare.   2. Chronic stable anemia.   3. Protein depletion, malnutrition.   the patient was seen along with GI, he was diagnosed with Crohn's flareup, he improved and was discharged home on prednisone 40 mg daily as well as Cipro and Flagyl He was found to be malnourished, the nutritionist was consulted, they recommended Resource to [place] two times a day  he was found to have chronic anemia, he was not transfused.  Pertinent labs and x-rays   CT of the abdomen, May 09, 2010:  Inflammatory changes suggesting segmental colitis.  No evidence of obstruction.  Free fluid in the pelvis probably related to colonic inflammation.  last potassium 3.1, creatinine 0.7. TSH 0.7 Last CBC showed a white count of 5.0, hemoglobin 7.6, platelets 521. Iron was 60, normal. Ferritin was 710, elevated.  Current Medications (verified): 1)  Azathioprine 50 Mg Tabs (Azathioprine) .... Take 1/2 Tablet By Mouth Once Daily 2)  Spironolactone 25 Mg Tabs (Spironolactone) .Marland Kitchen.. 1 By Mouth Once Daily 3)  Mvi 4)  Tramadol Hcl 50 Mg Tabs (Tramadol Hcl) .Marland Kitchen.. 1 Every 6 Hrs As Needed For Ear Pain 5)  Bentyl 10 Mg Caps (Dicyclomine Hcl) .Marland Kitchen.. 1 By Mouth Every 4 Hours As Needed For Pain 6)  Tums 500 Mg Chew (Calcium Carbonate Antacid) 7)  Cipro 500 Mg Tabs (Ciprofloxacin Hcl) .... Two Times A Day 8)  Flexeril 5 Mg Tabs (Cyclobenzaprine Hcl) .... Three Times A Day As Needed 9)  Metoprolol Tartrate 25 Mg Tabs (Metoprolol  Tartrate) .... Two Times A Day 10)  Flagyl 500 Mg Tabs (Metronidazole) .... Q8hrs 11)  Nutrional Supplement 12)  Prednisone 20 Mg Tabs (Prednisone) .... Once Daily  Allergies (verified): 1)  ! * Asacol  Past History:  Past Medical History: Reviewed history from 02/02/2010 and no changes required. CROHNS DISEASE ;S/P PERIANAL ABSCESS 2001,S/P RIGHT HEMICOLECTOMY ,AND DRAINAGE OF RETROPERITONEAL ABSCESS 2003 Hypertension hearing loss upper GI bleed with normal EGD  6-11, 3 PRBCs  Past Surgical History: Reviewed history from 03/19/2009 and no changes required. rt hemicolectomy 08/29/2001 (perforated abscess) ANAL FISTULOTOMY 2002  Social History: Reviewed history from 02/02/2010 and no changes required. 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   Review of Systems       since he  left the hospital he is doing better, still feels weak and thinks he won't be able to go back to work just yet because he does heavy lifting at work Denies fevers No nausea, vomiting, diarrhea. He has an appointment with GI and he plans to follow up with them good compliance with all his medications including his nutritional supplements   Physical Exam  General:  alert, well-developed, and underweight appearing.   Lungs:  Normal respiratory effort, chest expands symmetrically.  Lungs are clear to auscultation, no crackles or wheezes.  Heart:  normal rate, regular rhythm, and no murmur.   Abdomen:  soft, non-tender, normal bowel sounds, no distention, no masses, no guarding, no rigidity, and no rebound tenderness.   Extremities:  no edema   Impression & Recommendations:  Problem # 1:  CROHN'S DISEASE-LARGE INTESTINE (ICD-555.1) status post an admission to the hospital for  abdominal pain thought to be a Crohn's flareup doing  better good compliance with  medication recommend to continue meds including the nutritional supplements and see GI as planned  Problem # 2:  ANEMIA OF CHRONIC DISEASE (ICD-285.29) continue with anemia, chronic. During his recent admission to the hospital, his iron was within normal, he was not transfused. In the past we considered referral to hematology for IV iron. At this point we'll hold off on the referral .  Problem # 3:  HYPERTENSION (ICD-401.9) well-controlled, persistent hypokalemia and hospital. Recheck His updated medication list for this problem includes:    Spironolactone 25 Mg Tabs (Spironolactone) .Marland Kitchen... 1 by mouth once daily    Metoprolol Tartrate 25 Mg Tabs (Metoprolol tartrate) .Marland Kitchen..Marland Kitchen Two times a day  BP today: 136/86 Prior BP: 124/86 (05/09/2010)  Labs Reviewed: K+: 3.7 (09/29/2009) Creat: : 0.8 (09/29/2009)   Chol: 209 (02/26/2008)   HDL: 34 (02/26/2008)   LDL: 142 (02/26/2008)   TG: 167 (02/26/2008)  Orders: Venipuncture ([address]) TLB-BMP (Basic Metabolic Panel-BMET) (80048-METABOL) Specimen Handling (60454)  Problem # 4:  CC to Dr Madilyn Fireman, GI  Complete Medication List: 1)  Azathioprine 50 Mg Tabs (Azathioprine) .... Take 1/2 tablet by mouth once daily 2)  Spironolactone 25 Mg Tabs (Spironolactone) .Marland Kitchen.. 1 by mouth once daily 3)  Mvi  4)  Tramadol Hcl 50 Mg Tabs (Tramadol hcl) .Marland Kitchen.. 1 every 6 hrs as needed for ear pain 5)  Bentyl 10 Mg Caps (Dicyclomine hcl) .Marland Kitchen.. 1 by mouth every 4 hours as needed for pain 6)  Tums 500 Mg Chew (Calcium carbonate antacid) 7)  Cipro 500 Mg Tabs (Ciprofloxacin hcl) .... Two times a day 8)  Flexeril 5 Mg Tabs (Cyclobenzaprine hcl) .... Three times a day as needed 9)  Metoprolol Tartrate 25 Mg Tabs (Metoprolol tartrate) .... Two times a day 10)  Flagyl 500 Mg Tabs (Metronidazole) .... Q8hrs 11)  Nutrional Supplement  12)  Prednisone 20 Mg Tabs (Prednisone) .... Once daily  Patient Instructions: 1)  continue taking  all medicines as prescribed including the  nutritional supplement 2)  Keep the followup with the stomach doctor 3)  come back in 4 months for a routine checkup

## 2010-09-06 NOTE — Letter (Signed)
Summary: Office Visit Letter  [facility]  [address] [place], [place] [address]   Phone: [phone]  Fax: [phone]      November 03, 2009 MRN: [id number]   [facility] [address] [place], [place]  [address]   Dear Mr. Deskin,   According to our records, it is time for you to schedule a follow-up office visit with [place] in the month of May 2011.   At your convenience, please call [phone] (option #2)to schedule an office visit. If you have any questions, concerns, or feel that this letter is in error, we would appreciate your call.   Sincerely,  Barbette Hair. Arlyce Dice, M.D.   [facility] [phone]

## 2010-09-06 NOTE — Progress Notes (Signed)
Summary: refill  Phone Note Refill Request Message from:  Fax from Pharmacy on January 21, 2010 8:12 AM  Refills Requested: Medication #1:  METOPROLOL TARTRATE 50 MG TABS 1 1/2 [facility] [address] [facility] fax [phone] - ph [phone]  Initial call taken by: Okey Regal [place],  January 21, 2010 8:13 AM    Prescriptions: METOPROLOL TARTRATE 50 MG TABS (METOPROLOL TARTRATE) 1 1/2 by mouth bid  #45 x 0   Entered by:   Jeremy Johann CMA   Authorized by:   Nolon Rod. Paz MD   Signed by:   Jeremy Johann CMA on 01/21/2010   Method used:   [facility]ed [facility] ...       [facility]  [address] [phone]       [address]       [place], [place]  [id number]       Ph: [phone] or [phone]       Fax: [phone]   RxID:   [id number]

## 2010-09-06 NOTE — Progress Notes (Signed)
Summary: 2nd Lab reminder  Phone Note Outgoing Call Call back at [facility] Phone [phone]   Call placed by: Merri Ray CMA Duncan Dull),  May 20, 2010 10:01 AM Summary of Call: Called pt to remind him that his labs are past due.Marland KitchenMarland KitchenMarland KitchenHe had a BMP draawn at Paz's office but not a CBC.Marland Kitchen Need CBC drawn L/M for pt  Initial call taken by: Merri Ray CMA (AAMA),  May 20, 2010 10:02 AM

## 2010-09-06 NOTE — Progress Notes (Signed)
Summary: needs OV after an admission to the hospital--lm 6/24  Phone Note Outgoing Call   Summary of Call: patient was in the hospital,needs a follow what with [place]. Please arrange an office visit within the next 2 weeks Vestal Markin E. Ambri Miltner MD  January 28, 2010 2:12 PM   Follow-up for Phone Call        Left message on machine for pt to return my call. Mervin Kung CMA  January 28, 2010 2:44 PM  pt has pending OV 02-02-10 with dr Drue Novel............Marland KitchenFelecia Deloach CMA  January 31, 2010 11:39 AM

## 2010-09-06 NOTE — Letter (Signed)
Summary:  Cancer Center  [facility]   Imported By: Sherian Rein 05/12/2010 12:09:01  _____________________________________________________________________  External Attachment:    Type:   Image     Comment:   External Document

## 2010-09-06 NOTE — Progress Notes (Signed)
Summary: Triage  Phone Note Call from Patient   Caller: Father  Rick Edwards  [phone] Call For: Dr. Arlyce Dice Reason for Call: Talk to Nurse Summary of Call: pt wants to know if he can take a multi-vitamin Initial call taken by: Karna Christmas,  Dec 06, 2009 1:23 PM  Follow-up for Phone Call        OK to take per Dr. Arlyce Dice.  Rick Edwards notified. Follow-up by: Ashok Cordia RN,  Dec 06, 2009 2:35 PM

## 2010-09-06 NOTE — Letter (Signed)
Summary: Out of Work  [facility] [employer]  [address] [place], [place] [address]   Phone: [phone]  Fax: [phone]    April 19, 2010   Employee:  KALIK HOARE [facility] [facility] [employer]    To Whom It May Concern:   For Medical reasons, please excuse the above named employee from work for the following dates:  Start:   04/18/2010  End:   04/19/2010  If you need additional information, please feel free to contact our office.         Sincerely,    [name], MD

## 2010-09-06 NOTE — Assessment & Plan Note (Signed)
Summary: FOLLOWUP//KN   Vital Signs:  Patient profile:   55 year old male Weight:      143.25 pounds Pulse rate:   106 / minute Pulse rhythm:   regular BP sitting:   124 / 86  (left arm) Cuff size:   regular  Vitals Entered By: Army Fossa CMA (May 09, 2010 3:45 PM) CC: Pt here for f/u visit  Comments States he is in a lot of pain. Gets cold and hot. Had diarrhea last week. Still unable to eat a lot. [facility] [address]    History of Present Illness: patient was seen about 3  weeks ago with abdominal pain, anemia. a plain x-ray of the abdomen was negative He was prescribed until for atypical symptoms and referred to hematology for IV iron. Eventually, the patient so a different gastroenterologist ,Dr. Madilyn Fireman 3 days ago, they ordered blood work for tomorrow as well as a CAT scan which is pending.  He is here today because the pain is worse. Benadryl has not helped Continue with her appetite He has about 3 bowel movements daily, looser stools  ROS Denies fever No blood in the stools No nausea or vomiting no  dysuria or gross hematuria increased fatigue   Current Medications (verified): 1)  Azathioprine 50 Mg Tabs (Azathioprine) .... Take 1/2 Tablet By Mouth Once Daily 2)  Spironolactone 25 Mg Tabs (Spironolactone) .Marland Kitchen.. 1 By Mouth Once Daily 3)  Mvi 4)  Tramadol Hcl 50 Mg Tabs (Tramadol Hcl) .Marland Kitchen.. 1 Every 6 Hrs As Needed For Ear Pain 5)  Bentyl 10 Mg Caps (Dicyclomine Hcl) .Marland Kitchen.. 1 By Mouth Every 4 Hours As Needed For Pain  Allergies: 1)  ! * Asacol  Past History:  Past Medical History: Reviewed history from 02/02/2010 and no changes required. CROHNS DISEASE ;S/P PERIANAL ABSCESS 2001,S/P RIGHT HEMICOLECTOMY ,AND DRAINAGE OF RETROPERITONEAL ABSCESS 2003 Hypertension hearing loss upper GI bleed with normal EGD  6-11, 3 PRBCs  Past Surgical History: Reviewed history from 03/19/2009 and no changes required. rt hemicolectomy 08/29/2001 (perforated  abscess) ANAL FISTULOTOMY 2002  Family History: Reviewed history from 03/02/2009 and no changes required. CAD - no DM - aunt stroke - GF, aunts colon ca - no prostate Ca - no HTN -- "the whole family"  Social History: Reviewed history from 02/02/2010 and no changes required. 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   Physical Exam  General:  alert, well-developed, and underweight appearing.   Eyes:  pale Lungs:  Normal respiratory effort, chest expands symmetrically. Lungs are clear to auscultation, no crackles or wheezes.  Heart:  normal rate, regular rhythm, and no murmur.   Abdomen:  soft, normal bowel sounds, and no distention.  abdomen is quite tender at the right flank and lower quadrant, no mass, questionable rebound. Extremities:  no edema    Impression & Recommendations:  Problem # 1:  CROHN'S DISEASE-LARGE INTESTINE (ICD-555.1)  patient is deteriorating Increasing abdominal pain, tenderness in the right side of the abdomen, hemoglobin has decreased from 8 to 7.0, he is tachycardic. I spoke with Dr. Madilyn Fireman, his new GI physician, he agreed with me that there is enough information to admit him to the hospital Case discussed with the hospitalist I am enclosing my previous office visit as well as the most recent labs    Orders: Hgb (13086)  Complete Medication List: 1)  Azathioprine 50 Mg Tabs (Azathioprine) .... Take 1/2 tablet by mouth once daily 2)  Spironolactone 25 Mg Tabs (Spironolactone) .Marland Kitchen.. 1 by mouth once daily 3)  Mvi  4)  Tramadol Hcl 50 Mg Tabs (Tramadol hcl) .Marland Kitchen.. 1 every 6 hrs as needed for ear pain 5)  Bentyl 10 Mg Caps (Dicyclomine hcl) .Marland Kitchen.. 1 by mouth every 4 hours as needed for pain  Laboratory Results   CBC   HGB:  7.0 g/dL   (Normal Range: 30.8-65.7 in Males, 12.0-15.0 in Females)

## 2010-09-06 NOTE — Progress Notes (Signed)
Summary: addendum to labs, thrombocytosis   Phone Note Outgoing Call   Summary of Call: addendum CBC showed normal hemoglobin, his platelets were in the 800 range. Chart review,  he has chronic thrombocytosis advise patient - He has chronic increase in his platelets. Etiology unclear I recommend a hematology referral. Please arranged if patient in agreement   [name] E. Tyquarius Paglia MD  February 14, 2010 9:07 AM   Follow-up for Phone Call        left message for pt to call back. Army Fossa CMA  February 14, 2010 10:10 AM  left message on voicemail to call back to office. [name]  February 15, 2010 10:26 AM   Additional Follow-up for Phone Call Additional follow up Details #1::        Patient notified and requested that we call his parents- [name] or Azael Ragain at [phone]. Left message on machine for them to call back to office. [name]  February 15, 2010 10:41 AM   New Problems: THROMBOCYTOSIS (ICD-289.9)   Additional Follow-up for Phone Call Additional follow up Details #2::    Patient father "[name]" notified and states that referral is ok. He is aware that MD will enter referral and  Please call him once patient appt has been scheduled. [name]  February 15, 2010 12:57 PM   Please send all the available CBCs to hematology as well as the  last 2 office visit notes Calvin Chura E. Danaysha Kirn MD  February 15, 2010 4:29 PM   Additional Follow-up for Phone Call Additional follow up Details #3:: Details for Additional Follow-up Action Taken: Sent to hematology. Additional Follow-up by: Harold Barban,  February 15, 2010 4:52 PM  New Problems: THROMBOCYTOSIS (ICD-289.9)

## 2010-09-06 NOTE — Progress Notes (Signed)
Summary: refill request  Phone Note Refill Request Message from:  Fax from Pharmacy on August 30, 2009 2:18 PM  Refills Requested: Medication #1:  klor con m20 tablet   Dosage confirmed as above?Dosage Confirmed   Brand Name Necessary? No   Supply Requested: 1 month   Last Refilled: 08/11/2009  Method Requested: Electronic Next Appointment Scheduled: 09-08-09 GI [name] Initial call taken by: Roselle Locus,  August 30, 2009 2:18 PM  Follow-up for Phone Call        [facility]  [address], informed pt should not be taking d/c 08/25/09 Follow-up by: Kandice Hams,  August 30, 2009 3:36 PM

## 2010-09-06 NOTE — Progress Notes (Signed)
   ------------------------------    Phone Note Outgoing Call   Summary of Call: ---- Converted from flag ----  ---- 04/21/2010 9:19 PM, Jaymie Misch E. Anihya Tuma MD wrote: lab results??  ---- 04/22/2010 2:23 PM, Floydene Flock wrote: I don't think this pt. stopped for his labs.  There is not record in this lab, [facility] or [facility]. However, he had some of the same labs you ordered with Dr. Shirline Frees (sp) at [facility] on 9-12.  I am having [facility] fax those here. ---- 04/22/2010 7:46 AM, Army Fossa CMA wrote:   Follow-up for Phone Call        waiting for labs  may need re-referal to GI Chibuike Fleek E. Addelyn Alleman MD  April 24, 2010 11:31 AM

## 2010-09-06 NOTE — Procedures (Signed)
Summary: ENDOSCOPY  Endoscopy Procedure - STATUS: Final  .                                         Perform Date: 19Jun11 13:27  Ordered By: Hoy Finlay MD , PROVIDER         Ordered Date:  Facility: [facility]                              Department: ENDO  Service Report Text  Upper Endoscopy    [facility]   [address]   [place], [place] [address]    OPERATIVE PROCEDURE REPORT    PATIENT: Raymondo, Garcialopez MR#: [id number]   BIRTHDATE: August 23, 1955 GENDER: male   ENDOSCOPIST: Lorenza Burton, MD   ASSISTANT: Kerolox Risk, technician and Claudie Revering, RN.    PROCEDURE DATE: 01/23/2010   PRE-PROCEDURE PREPERATION: Patient fasted for 8 hours prior to   the procedure.   PRE-PROCEDURE PHYSICAL: Patient has stable vital signs. Neck is   supple. There is no JVD, thyromegaly or LAD. Chest clear to   auscultation. S1 and S2 regular.Abdomen soft, non-distended,   non-tender with NABS.   PROCEDURE: EGD, diagnostic   ASA CLASS: Class II   INDICATIONS: 1) Hematochezia 2) Iron deficiency anemia Hemoglobin   7 gms/dl on admission [S/P 2units of PRC transfusion] 3) Personal   history of Crohn's colitis.   MEDICATIONS: Fentanyl 100 mcg IV, Versed 7.5 mg   TOPICAL ANESTHETIC: Vicous xylocaine-10 cc PO.    DESCRIPTION OF PROCEDURE: After the risks benefits and   alternatives of the procedure were thoroughly explained, informed   consent was obtained. The EG-[id number] ([id number]) endoscope was   introduced through the mouth and advanced to the second portion of   the duodenum, without limitations. The instrument was slowly   withdrawn as the mucosa was fully examined.   <<PROCEDUREIMAGES>>    There was a wide open Scahtzk's ring in the diatl esophagus. The   rest of the esophagus, stomach and the proximal small bowel   appeared normal. There were no ulcers, erosions, masses or polyps   noted. Retroflexed views revealed no abnormalities. There was no   fresh or old heme in the upper GI  tract. The scope was then   withdrawn from the patient and the procedure terminated. The   patient tolerated the procedure without immediate complications.    IMPRESSION: Wide open Schatzki's ring in distal esophagus;   otherwise normal esophagogastroduodenoscopy.   RECOMMENDATIONS: 1) Anti-reflux regimen to be followed.   2) Avoid NSAIDS for now.   3) Resume full liquid diet.    REPEAT EXAM: None planned for now.    DISCHARGE INSTRUCTIONS: Standard discharge instructions given.    ______________________________   Lorenza Burton, MD    CPT CODES: 95621    DIAGNOSIS CODES: 569.3, 280.9, 555.6    CC: Barbette Hair. Arlyce Dice, M.D.    n.   eSIGNED: Lorenza Burton at 01/23/2010 01:44 PM    Baruch Goldmann, [id number]  Additional Information  HL7 RESULT STATUS : F  External IF Update Timestamp : 2010-01-23:13:44:11.000000

## 2010-09-06 NOTE — Progress Notes (Signed)
Summary: Follow up lab reminder  Phone Note Outgoing Call Call back at Home Phone [phone]   Call placed by: Merri Ray CMA Duncan Dull),  September 03, 2009 9:51 AM Summary of Call: Called pt to remind him to have follow up labs drawn before his appointment on 09/08/2009. L/M for him to R/C if he has any questions Initial call taken by: Merri Ray CMA Duncan Dull),  September 03, 2009 9:52 AM

## 2010-09-06 NOTE — Consult Note (Signed)
NAMEJAKYREN, Rick Edwards             ACCOUNT NO.:  [id number]      MEDICAL RECORD NO.:  [id number]          PATIENT TYPE:  INP      LOCATION:  3736                         FACILITY:  [facility]      PHYSICIAN:  Anselmo Rod, M.D.  DATE OF BIRTH:  1955/08/24      DATE OF CONSULTATION:  01/22/2010   DATE OF DISCHARGE:                                    CONSULTATION      GASTROENTEROLOGY CONSULTATION:  Cross-cover for [facility].      REASON FOR CONSULTATION:  Rectal bleeding.      ASSESSMENT:   1. Rectal bleeding, question melena, in a 56 year old black male with       history of Crohn disease.  Rule out arteriovenous malformations,       peptic ulcer disease, Dieulafoy, etc.   2. Anemia secondary to acute blood loss with a hemoglobin of 7.0 g/dL       on admission yesterday.   3. History of Crohn disease on Imuran.   4. Status post right hemicolectomy with abscess drained into 2003.   5. Hypertension.   6. 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      RECOMMENDATIONS:   1. EGD in the a.m.   2. Continue serial CBCs.   3. Avoid all nonsteroidals for now.   4. Further recommendation to be made after the EGD has been done.      DISCUSSION:  [name] is a pleasant, 55 year old, black male   with the above-mentioned medical problems admitted yesterday for melenic   stools.  The patient gives a history of noticing black stools on 4   occasions yesterday prior to admission.  In the ER, he was found to have   a hemoglobin of 7.0 g/dL prompting hospitalization.  He is currently   receiving packed red blood cells.  He denies nausea, vomiting, fever,   chills, or rigors.  There is no history of hematochezia.  His appetite   is good.  His weight has been stable.  He denies a history of ulcers or   jaundice in the past.      PAST MEDICAL HISTORY:  See list above.      ALLERGIES:  NO KNOWN DRUG ALLERGIES.      MEDICATIONS IN THE HOSPITAL:  Imuran,  Lopressor, pantoprazole,   spironolactone, Dilaudid, Zofran, and Roxicet.      SOCIAL HISTORY:  [place]  He denies   the use of alcohol, tobacco, or drugs.      FAMILY HISTORY:  Noncontributory.      REVIEW OF SYSTEMS:  Melenic stools.  The patient denies a history of   nausea, vomiting, abdominal pain, previous chills or rigors.  His   appetite is good.  Weight has been stable.      GENERAL PHYSICAL EXAMINATION:  GENERAL:  Patient is a cooperative,   middle-aged, black male in no acute distress.  The patient has a hearing   aid on his left  ear.   VITAL SIGNS:  Temperature 98.3, blood pressure 141/97, pulse 72 a   minute, respiratory rate 18.   HEENT:  Atraumatic, normocephalic head.  Facial symmetry is preserved.   Oropharyngeal mucosa without exudate.   NECK:  Supple.   CHEST:  Clear to auscultation, S1 S2 regular.   ABDOMEN:  Soft with a midline surgical scar present from above-mentioned   surgery.   RECTAL:  Examination deferred.      ADMISSION LABORATORY EVALUATION:  A hemoglobin of the 7 g/dL with a   platelet count of 621, a white count of 5.8K.  PT 14.3 with INR of 1.12.   BMET revealed a sodium of 137, potassium of 3, and glucose of 116.  BUN   and creatinine were normal.  Calcium low at 7.9.  CBC today revealed a   hemoglobin of 7.7 with platelets of 551.  The patient is receiving 2   units of packed red blood cells.  Potassium 3.6 today.      PLANS:  As above.  Further recommendation to be made in followup.               Anselmo Rod, M.D.            JNM/MEDQ  D:  01/22/2010  T:  01/22/2010  Job:  [id number]      cc:   Barbette Hair. Arlyce Dice, MD,FACG   [name], MD

## 2010-09-06 NOTE — Progress Notes (Signed)
Summary: REFILL REQUEST  Phone Note Refill Request Call back at [phone] Message from:  Pharmacy on January 25, 2010 9:05 AM  Refills Requested: Medication #1:  SPIRONOLACTONE 25 MG TABS 1 by mouth once daily   Dosage confirmed as above?Dosage Confirmed   [facility] Requested[facility] [address] [facility]  Next Appointment Scheduled: JUNE 29TH 2011 Initial call taken by: Lavell Islam,  January 25, 2010 9:06 AM  Follow-up for Phone Call        SPOKE TO PHARMACY RX ON FILE DISREGARD REQUEST................Marland KitchenFelecia Deloach CMA  January 25, 2010 10:49 AM

## 2010-09-06 NOTE — Progress Notes (Signed)
Summary: Changed GI Offices  Phone Note Outgoing Call Call back at [facility] Phone [phone]   Call placed by: Merri Ray CMA Duncan Dull),  June 02, 2010 11:08 AM Summary of Call: Called pt to inform labs are past due. Pt informed me he has continued care at [facility].  [name], Lorain Childes Initial call taken by: Merri Ray CMA Duncan Dull),  June 02, 2010 11:08 AM  Follow-up for Phone Call        ok Follow-up by: Louis Meckel MD,  June 02, 2010 11:16 AM

## 2010-09-06 NOTE — Progress Notes (Signed)
Summary: Letters received  Phone Note Call from Patient Call back at Home Phone [phone]   Caller: Lovette Cliche Summary of Call: Patient father left message on triage that were receivng letters from Dr. Marzetta Board office. I made them aware to call their office and notify them that he is now being seen by [facility]. He expressed understanding and will call on behalf of his son. Initial call taken by: [name] CMA,  May 20, 2010 11:24 AM

## 2010-09-06 NOTE — Assessment & Plan Note (Signed)
Summary: bp check/kdc  Nurse Visit   Vital Signs:  Patient profile:   55 year old male Height:      70 inches Weight:      177.4 pounds BMI:     25.55 Pulse rate:   78 / minute BP sitting:   160 / 92  Vitals Entered By: Shary Decamp (October 13, 2009 10:45 AM)  Current Medications (verified): 1)  Azathioprine 50 Mg Tabs (Azathioprine) .... Take 1/2 Tablet By Mouth Once Daily 2)  Metoprolol Tartrate 50 Mg Tabs (Metoprolol Tartrate) .Marland Kitchen.. 1 By Mouth Twice A Day 3)  Spironolactone 25 Mg Tabs (Spironolactone) .Marland Kitchen.. 1 By Mouth Once Daily 4)  Bentyl 10 Mg  Caps (Dicyclomine Hcl) .... Take 1 Every 6 Hours As Needed For Abd. Pain. 5)  Viagra 100 Mg Tabs (Sildenafil Citrate) .... Half or One Tablet Daily As Needed  Allergies (verified): 1)  ! * Asacol  Impression & Recommendations:  Problem # 1:  HYPERTENSION (ICD-401.9)  not that goal, increase metoprolol 50 mg to 1.5 twice a day nurse visit in  3 weeks,  no charge, for BP check  His updated medication list for this problem includes:    Metoprolol Tartrate 50 Mg Tabs (Metoprolol tartrate) .Marland Kitchen... 1 1/2 by mouth bid    Spironolactone 25 Mg Tabs (Spironolactone) .Marland Kitchen... 1 by mouth once daily  BP today: 160/92 Prior BP: 150/100 (09/29/2009)  Labs Reviewed: K+: 3.7 (09/29/2009) Creat: : 0.8 (09/29/2009)   Chol: 209 (02/26/2008)   HDL: 34 (02/26/2008)   LDL: 142 (02/26/2008)   TG: 167 (02/26/2008)  Orders: No Charge Patient Arrived (NCPA0) (NCPA0)  Complete Medication List: 1)  Azathioprine 50 Mg Tabs (Azathioprine) .... Take 1/2 tablet by mouth once daily 2)  Metoprolol Tartrate 50 Mg Tabs (Metoprolol tartrate) .Marland Kitchen.. 1 1/2 by mouth bid 3)  Spironolactone 25 Mg Tabs (Spironolactone) .Marland Kitchen.. 1 by mouth once daily 4)  Bentyl 10 Mg Caps (Dicyclomine hcl) .... Take 1 every 6 hours as needed for abd. pain. 5)  Viagra 100 Mg Tabs (Sildenafil citrate) .... Half or one tablet daily as needed   Past History:  Past Medical History: Reviewed  history from 03/19/2009 and no changes required. CROHNS DISEASE ;S/P PERIANAL ABSCESS 2001,S/P RIGHT HEMICOLECTOMY ,AND DRAINAGE OF RETROPERITONEAL ABSCESS 2003 Hypertension hearing loss  Past Surgical History: Reviewed history from 03/19/2009 and no changes required. rt hemicolectomy 08/29/2001 (perforated abscess) ANAL FISTULOTOMY 2002   Orders Added: 1)  No Charge Patient Arrived (NCPA0) [NCPA0]   Patient Instructions: 1)  increase metoprolol to 50mg  1 1/2 tablet twice a day 2)  nurse visit in 3 weeks to recheck blood pressure

## 2010-09-06 NOTE — Letter (Signed)
Summary: Work Dietitian at [employer]  [address] [place], [place] [address]   Phone: [phone]  Fax: [phone]    Today's Date: March 29, 2010  Name of Patient: Rick Edwards [facility]  The above named patient had a medical visit today at:  am / 1:20pm.  Please take this into consideration when reviewing the time away from work/school.    Special Instructions:  [ X ] None  [  ] To be off the remainder of today, returning to the normal work / school schedule tomorrow.  [  ] To be off until the next scheduled appointment on ______________________.  [  ] Other ________________________________________________________________ ________________________________________________________________________   Sincerely yours,   Shonna Chock CMA

## 2010-09-06 NOTE — Letter (Signed)
Summary: Encounter Notice/[facility]  Encounter Notice/[facility]   Imported By: Lanelle Bal 05/24/2010 15:46:29  _____________________________________________________________________  External Attachment:    Type:   Image     Comment:   External Document

## 2010-09-06 NOTE — Assessment & Plan Note (Signed)
Summary: 3 MTH FU/KDC   Vital Signs:  Patient profile:   55 year old male Height:      70 inches Weight:      177.4 pounds BMI:     25.55 Pulse rate:   72 / minute BP sitting:   150 / 100  Vitals Entered By: Shary Decamp (September 29, 2009 9:56 AM) CC: rov Comments  - pt is worried about his father -- he is in hosp with heart problems  - pt had pork yesterday Shary Decamp  September 29, 2009 10:00 AM    History of Present Illness: -- long h/o ED, using Viagra one time before, it helped, did not have side effects.  Would like a prescription  --rash in the chest x a while   Current Medications (verified): 1)  Azathioprine 50 Mg Tabs (Azathioprine) .... Take 1/2 Tablet By Mouth Once Daily 2)  Atenolol 50 Mg Tabs (Atenolol) .Marland Kitchen.. 1po Once Daily As Needed 3)  Spironolactone 25 Mg Tabs (Spironolactone) .Marland Kitchen.. 1 By Mouth Once Daily 4)  Bentyl 10 Mg  Caps (Dicyclomine Hcl) .... Take 1 Every 6 Hours As Needed For Abd. Pain.  Allergies (verified): 1)  ! * Asacol  Past History:  Past Medical History: Reviewed history from 03/19/2009 and no changes required. CROHNS DISEASE ;S/P PERIANAL ABSCESS 2001,S/P RIGHT HEMICOLECTOMY ,AND DRAINAGE OF RETROPERITONEAL ABSCESS 2003 Hypertension hearing loss  Past Surgical History: Reviewed history from 03/19/2009 and no changes required. rt hemicolectomy 08/29/2001 (perforated abscess) ANAL FISTULOTOMY 2002  Social History: Reviewed history from 06/29/2009 and no changes required. not married no children lives by himself Alcohol use-yes (3 beers daily)---denies ETOH 06-29-09 Drug use-no Regular exercise-no works @ post [employer]  Patient is a former smoker.   Review of Systems       went back on BP meds, BP  was elevated and restarted atenolol General:  Denies chills and fever. GI:  Denies diarrhea, nausea, and vomiting.  Physical Exam  General:  alert and well-developed.   Lungs:  normal respiratory effort, no intercostal  retractions, no accessory muscle use, and normal breath sounds.   Heart:  normal rate, regular rhythm, and no murmur.    Extremities:  no edema Skin:  continue with multiple one or 2 mm papular hyperpigmented lesions in the upper abdomen and lower chest   Impression & Recommendations:  Problem # 1:  HYPERTENSION (ICD-401.9) we recently changed his BP medication due to hypokalemia no ambulatory blood pressures, BP today elevated continue spironolactone, switch  from atenolol to metoprolol nurse visit in two weeks for a BP check, no charge His updated medication list for this problem includes:    Metoprolol Tartrate 50 Mg Tabs (Metoprolol tartrate) .Marland Kitchen... 1 by mouth twice a day    Spironolactone 25 Mg Tabs (Spironolactone) .Marland Kitchen... 1 by mouth once daily  BP today: 150/100 Prior BP: 138/82 (09/08/2009)  Labs Reviewed: K+: 3.2 (08/25/2009) Creat: : 0.9 (08/25/2009)   Chol: 209 (02/26/2008)   HDL: 34 (02/26/2008)   LDL: 142 (02/26/2008)   TG: 167 (02/26/2008)  Orders: Venipuncture ([address]) TLB-BMP (Basic Metabolic Panel-BMET) (80048-METABOL)  Problem # 2:  ERECTILE DYSFUNCTION, NON-ORGANIC (ICD-302.72) see HPI trial w/ viagra,  how to use it discussed with the patient  to call if side effects  His updated medication list for this problem includes:    Viagra 100 Mg Tabs (Sildenafil citrate) ..... Half or one tablet daily as needed  Problem # 3:  RASH-NONVESICULAR (ICD-782.1) dermatology referral  Orders: Dermatology Referral (  Derma)  Complete Medication List: 1)  Azathioprine 50 Mg Tabs (Azathioprine) .... Take 1/2 tablet by mouth once daily 2)  Metoprolol Tartrate 50 Mg Tabs (Metoprolol tartrate) .Marland Kitchen.. 1 by mouth twice a day 3)  Spironolactone 25 Mg Tabs (Spironolactone) .Marland Kitchen.. 1 by mouth once daily 4)  Bentyl 10 Mg Caps (Dicyclomine hcl) .... Take 1 every 6 hours as needed for abd. pain. 5)  Viagra 100 Mg Tabs (Sildenafil citrate) .... Half or one tablet daily as needed  Patient  Instructions: 1)  stop atenolol 2)  start metoprolol twice a day 3)  nurse visit in two weeks for a BP check 4)  Please schedule a follow-up appointment in 4 months .  Prescriptions: VIAGRA 100 MG TABS (SILDENAFIL CITRATE) half or one tablet daily as needed  #10 x 3   Entered and Authorized by:   Elita Quick E. Sophya Vanblarcom MD   Signed by:   Nolon Rod. Toddrick Sanna MD on 09/29/2009   Method used:   Print then Give to Patient   RxID:   [id number] METOPROLOL TARTRATE 50 MG TABS (METOPROLOL TARTRATE) 1 by mouth twice a day  #60 x 6   Entered and Authorized by:   Nolon Rod. Sanaya Gwilliam MD   Signed by:   Nolon Rod. Franz Svec MD on 09/29/2009   Method used:   [facility]  [address] [phone]ail[facility]       [address]       [place], [place]  [id number]       Ph: [phone] or [phone]       Fax: [phone]   RxID:   [id number]

## 2010-09-06 NOTE — Assessment & Plan Note (Signed)
Summary: LEFT EAR PAIN//PH   Vital Signs:  Patient profile:   55 year old male Weight:      159.8 pounds Temp:     98.6 degrees F oral Pulse rate:   72 / minute Resp:     17 per minute BP sitting:   110 / 68  (left arm) Cuff size:   regular  Vitals Entered By: Shonna Chock CMA (March 29, 2010 12:01 PM) CC: Left ear concerns since Sunday night, URI symptoms   Primary Care Provider:  [name], MD  CC:  Left ear concerns since Sunday night and URI symptoms.  History of Present Illness:       This is a 55 year old man who presents with  L earache since 03/27/2010  The patient reports earache w/o discharge, but denies nasal congestion, purulent nasal discharge, sore throat, and productive cough.  The patient denies fever.  The patient denies frontal  headache.  The patient denies the following risk factors for Strep sinusitis:   facial pain.  No extrinsic symtoms. Hearing has decreased even with an aid on L. No Rx.  Current Medications (verified): 1)  Azathioprine 50 Mg Tabs (Azathioprine) .... Take 1/2 Tablet By Mouth Once Daily 2)  Metoprolol Tartrate 50 Mg Tabs (Metoprolol Tartrate) .Marland Kitchen.. 1 1/2 By Mouth Bid 3)  Spironolactone 25 Mg Tabs (Spironolactone) .Marland Kitchen.. 1 By Mouth Once Daily 4)  Tramadol Hcl 50 Mg Tabs (Tramadol Hcl) .Marland Kitchen.. 1 By Mouth Q8hrs As Needed 5)  Mvi 6)  Iron Pills .Marland Kitchen.. 1 By Mouth Once Daily  Allergies: 1)  ! * Asacol  Review of Systems Allergy:  Denies itching eyes and sneezing.  Physical Exam  General:  in no acute distress; alert,appropriate and cooperative throughout examination Ears:  dense impactions bilaterally. Aid worn on L. After soaking & curettage , dense impaction persists. Nose:  External nasal examination shows no deformity or inflammation. Nasal mucosa are pink and moist without lesions or exudates. Mouth:  Oral mucosa and oropharynx without lesions or exudates. Severe caries I=& dental neglect. Lungs:  Normal respiratory effort, chest expands  symmetrically. Lungs are clear to auscultation, no crackles or wheezes.  Heart:  Normal rate and regular rhythm. S1 and S2 normal without gallop, murmur, click, rub . S4 Cervical Nodes:  No lymphadenopathy noted Axillary Nodes:  No palpable lymphadenopathy   Impression & Recommendations:  Problem # 1:  EAR PAIN, LEFT (ICD-388.70) probably due to #2  Problem # 2:  CERUMEN IMPACTION (ICD-380.4) with accentuation of pre existing hearing loss  Complete Medication List: 1)  Azathioprine 50 Mg Tabs (Azathioprine) .... Take 1/2 tablet by mouth once daily 2)  Metoprolol Tartrate 50 Mg Tabs (Metoprolol tartrate) .Marland Kitchen.. 1 1/2 by mouth bid 3)  Spironolactone 25 Mg Tabs (Spironolactone) .Marland Kitchen.. 1 by mouth once daily 4)  Tramadol Hcl 50 Mg Tabs (Tramadol hcl) .Marland Kitchen.. 1 by mouth q8hrs as needed 5)  Mvi  6)  Iron Pills  .Marland KitchenMarland Kitchen. 1 by mouth once daily 7)  Tramadol Hcl 50 Mg Tabs (Tramadol hcl) .Marland Kitchen.. 1 every 6 hrs as needed for ear pain  Patient Instructions: 1)  Put 3 drops of mineral oil into L ear at bedtime  & cover with a cotton ball overnight. In am fill L ear with hydrogen peroxide for 15 minutes  then shower. Use the thinnest washrag to "wick" out the wax. Do not use Q tips.Continue this program every night until there is no wax on the washrag after showering. Prescriptions:  TRAMADOL HCL 50 MG TABS (TRAMADOL HCL) 1 every 6 hrs as needed for ear pain  #30 x 0   Entered and Authorized by:   Marga Melnick MD   Signed by:   Marga Melnick MD on 03/29/2010   Method used:   Print then Give to Patient   RxID:   [id number]

## 2010-09-06 NOTE — Progress Notes (Signed)
Summary: due bp check   Phone Note Outgoing Call Call back at [facility] Phone [phone] Call back at Work Phone [phone]   Summary of Call: Due nurse visit for bp check Shary Decamp  December 03, 2009 1:33 PM   Follow-up for Phone Call        [email or web address] Barban  December 03, 2009 1:53 PM  Additional Follow-up for Phone Call Additional follow up Details #1::        Patient is coming in 6.28.11. Additional Follow-up by: Harold Barban,  Dec 06, 2009 9:36 AM

## 2010-09-06 NOTE — Miscellaneous (Signed)
Summary: RX Azathioprine 50 mg  Clinical Lists Changes  Medications: Changed medication from AZATHIOPRINE 50 MG TABS (AZATHIOPRINE) Take 1/2 tablet by mouth once daily to AZATHIOPRINE 50 MG TABS (AZATHIOPRINE) Take 1/2 tablet by mouth once daily - Signed Rx of AZATHIOPRINE 50 MG TABS (AZATHIOPRINE) Take 1/2 tablet by mouth once daily;  #135 x 90;  Signed;  Entered by: Lowry Ram NCMA;  Authorized by: Louis Meckel MD;  Method used: [facility]  [address] [address] [phone]*, [address], [place], [place], [place]  [id number], Ph: [phone] or [phone], Fax: [phone]    Prescriptions: AZATHIOPRINE 50 MG TABS (AZATHIOPRINE) Take 1/2 tablet by mouth once daily  #135 x 90   Entered by:   Lowry Ram NCMA   Authorized by:   Louis Meckel MD   Signed by:   Lowry Ram NCMA on 01/21/2010   Method used:   [facility]        [facility]  [address] [phone] (retail)       [address]       [place], [place]  [id number]       Ph: [phone] or [phone]       Fax: [phone]   RxID:   [id number]

## 2010-09-06 NOTE — Assessment & Plan Note (Signed)
Summary: POST HOSPITAL FOLLOW-UP            Rick Edwards   History of Present Illness Visit Type: Follow-up Visit Primary GI MD: Melvia Heaps MD [facility] Primary Provider: [name], MD Requesting Provider: n/a Chief Complaint: post hospital GI bleed, no blood seen recently History of Present Illness:   [name] has returned for followup of his Crohn's colitis.  He was hospitalized in June, 2011 with a GI bleed.  Endoscopy was negative.  He has documented Crohn's colitis characterized by segmental colitis for which he is taking azathioprine.  Since discharge he has had no further bleeding and has no GI complaints.   GI Review of Systems      Denies abdominal pain, acid reflux, belching, bloating, chest pain, dysphagia with liquids, dysphagia with solids, heartburn, loss of appetite, nausea, vomiting, vomiting blood, weight loss, and  weight gain.        Denies anal fissure, black tarry stools, change in bowel habit, constipation, diarrhea, diverticulosis, fecal incontinence, heme positive stool, hemorrhoids, irritable bowel syndrome, jaundice, light color stool, liver problems, rectal bleeding, and  rectal pain.    Current Medications (verified): 1)  Azathioprine 50 Mg Tabs (Azathioprine) .... Take 1/2 Tablet By Mouth Once Daily 2)  Metoprolol Tartrate 50 Mg Tabs (Metoprolol Tartrate) .Marland Kitchen.. 1 1/2 By Mouth Bid 3)  Spironolactone 25 Mg Tabs (Spironolactone) .Marland Kitchen.. 1 By Mouth Once Daily 4)  Pantoprazole Sodium 40 Mg Tbec (Pantoprazole Sodium) .Marland Kitchen.. 1 By Mouth Once Daily. 5)  Tramadol Hcl 50 Mg Tabs (Tramadol Hcl) .Marland Kitchen.. 1 By Mouth Q8hrs As Needed 6)  Mvi  Allergies (verified): 1)  ! * Asacol  Past History:  Past Medical History: Reviewed history from 02/02/2010 and no changes required. CROHNS DISEASE ;S/P PERIANAL ABSCESS 2001,S/P RIGHT HEMICOLECTOMY ,AND DRAINAGE OF RETROPERITONEAL ABSCESS 2003 Hypertension hearing loss upper GI bleed with normal EGD  6-11, 3 PRBCs  Past Surgical  History: Reviewed history from 03/19/2009 and no changes required. rt hemicolectomy 08/29/2001 (perforated abscess) ANAL FISTULOTOMY 2002  Family History: Reviewed history from 03/02/2009 and no changes required. CAD - no DM - aunt stroke - GF, aunts colon ca - no prostate Ca - no HTN -- "the whole family"  Social History: Reviewed history from 02/02/2010 and no changes required. 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   Review of Systems  The patient denies allergy/sinus, anemia, anxiety-new, arthritis/joint pain, back pain, blood in urine, breast changes/lumps, change in vision, confusion, cough, coughing up blood, depression-new, fainting, fatigue, fever, headaches-new, hearing problems, heart murmur, heart rhythm changes, itching, menstrual pain, muscle pains/cramps, night sweats, nosebleeds, pregnancy symptoms, shortness of breath, skin rash, sleeping problems, sore throat, swelling of feet/legs, swollen lymph glands, thirst - excessive , urination - excessive , urination changes/pain, urine leakage, vision changes, and voice change.    Vital Signs:  Patient profile:   55 year old male Height:      70 inches Weight:      162.25 pounds BMI:     23.36 Pulse rate:   80 / minute Pulse rhythm:   regular BP sitting:   126 / 86  (left arm) Cuff size:   regular  Vitals Entered By: June McMurray CMA Duncan Dull) (February 11, 2010 9:06 AM)   Impression & Recommendations:  Problem # 1:  GI BLEEDING (ICD-578.9) Assessment Improved This was likely due to  segmental colitis.  This has clinically resolved.  Medications #1 continue current medications  Problem # 2:  CROHN'S DISEASE-LARGE INTESTINE (ICD-555.1) Plan to continue azathioprine  Patient Instructions: 1)  Copy sent to : [facility] Paz,MD 2)  You will follow up labs in 3 and 6 months 3)  You will  need to make a follow up appointment with [name] in 6 months 4)  The medication list was reviewed and reconciled.  All changed / newly prescribed medications were explained.  A complete medication list was provided to the patient / caregiver.

## 2010-09-06 NOTE — Assessment & Plan Note (Signed)
Summary: 4  mth fu/kdc   Vital Signs:  Patient profile:   55 year old male Weight:      164.13 pounds Pulse rate:   82 / minute Pulse rhythm:   regular BP sitting:   134 / 84  (left arm) Cuff size:   regular  Vitals Entered By: Army Fossa CMA (February 02, 2010 9:57 AM) CC: Follow up Comments - was in in ER last week for abdominal pain and blood in stool. Doing fine now.    History of Present Illness: DATE OF ADMISSION:  01/21/2010 DATE OF DISCHARGE:  01/24/2010 admitted with dark stools, initial hemoglobin was 7.0, got  3 PRBCs, discharge hemoglobin 10.1 GI was consulted, they did an EGD on January 23, 2010, showed wide open Schatzki ring  , otherwise normal EGD. he was sent home w/ the plan for an  outpatient colonoscopy at some point. he was recommended to continue Imuran for Crohn's disease BMP at the time of discharge was essentially normal  Allergies: 1)  ! * Asacol  Past History:  Past Medical History: CROHNS DISEASE ;S/P PERIANAL ABSCESS 2001,S/P RIGHT HEMICOLECTOMY ,AND DRAINAGE OF RETROPERITONEAL ABSCESS 2003 Hypertension hearing loss upper GI bleed with normal EGD  6-11, 3 PRBCs  Past Surgical History: Reviewed history from 03/19/2009 and no changes required. rt hemicolectomy 08/29/2001 (perforated abscess) ANAL FISTULOTOMY 2002  Social History: Reviewed history from 06/29/2009 and no changes required. 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   Review of Systems General:  since he left the hospital, he is feeling very well No further dark stools, no red blood per rectum No nausea, vomiting, diarrhea No abdominal pain. MS:  complaining of aches in the upper extremities for a while, mostly in the morning, they decrease as the day goes by Has taking aspirin now and then but no daily NSAIDs recently Denies joint swelling or  warmness. The patient is not in statins..  Physical Exam  General:  alert, well-developed, and well-nourished.   Lungs:  normal respiratory effort, no intercostal retractions, no accessory muscle use, and normal breath sounds.   Heart:  normal rate, regular rhythm, no murmur, and no gallop.   Abdomen:  soft, non-tender, and no distention.  well healed surgical scars. Good BS Extremities:  no edema No  tender to palpation at the biceps, triceps or forearm Psych:  not anxious appearing and not depressed appearing.     Impression & Recommendations:  Problem # 1:  GI BLEEDING (ICD-578.9) status post admission for GI bleed, apparently upper GI had an EGD showed wideopen Schatzki ring  , otherwise normal EGD. plan: CBC Followup with GI already scheduled for July 8 Will call if symptoms resurface avoid  NSAIDs Orders: Venipuncture (04540) TLB-CBC Platelet - w/Differential (85025-CBCD)  Problem # 2:  MYALGIA (ICD-729.1) Assessment: New achy feeling in the upper extremities in the morning. Check a CK His updated medication list for this problem includes:    Tramadol Hcl 50 Mg Tabs (Tramadol hcl) .Marland Kitchen... 1 by mouth q8hrs as needed  Orders: TLB-CK Total Only(Creatine Kinase/CPK) (82550-CK)  Problem # 3:  F2F > 25 min d/t chart review  Complete Medication List: 1)  Azathioprine 50 Mg Tabs (Azathioprine) .... Take 1/2 tablet by mouth once daily 2)  Metoprolol Tartrate 50 Mg Tabs (Metoprolol tartrate) .Marland Kitchen.. 1 1/2 by mouth bid 3)  Spironolactone 25 Mg Tabs (Spironolactone) .Marland KitchenMarland KitchenMarland Kitchen  1 by mouth once daily 4)  Pantoprazole Sodium 40 Mg Tbec (Pantoprazole sodium) .Marland Kitchen.. 1 by mouth once daily. 5)  Tramadol Hcl 50 Mg Tabs (Tramadol hcl) .Marland Kitchen.. 1 by mouth q8hrs as needed 6)  Mvi   Patient Instructions: 1)  please see the  GI doctor on July 8 at 9:15 as scheduled 2)  next follow up with me for a physical by November 2011

## 2010-09-06 NOTE — Progress Notes (Signed)
Summary: Triage-bleeding  Phone Note Call from Patient   Caller: Father Rick Edwards  [phone] Call For: Dr. Arlyce Dice Reason for Call: Talk to Nurse Summary of Call: pt is having rectal bleeding Initial call taken by: Karna Christmas,  January 21, 2010 12:48 PM  Follow-up for Phone Call        Message left for patient to callback. Laureen Ochs LPN  January 21, 2010 2:35 PM  Message left for patient to callback. Laureen Ochs LPN  January 24, 2010 9:19 AM    Per [facility] notes, pt. was discharged 01-24-10 for anemia/bleeding. he is scheduled for a f/u with Dr.Kaplan on 02-11-10 at 9:15am. Message left for pt's father with appt. information and I wil mail a reminder letter to pt. If symptoms become worse call back immediately or go to ER.  Follow-up by: Laureen Ochs LPN,  January 25, 2010 9:39 AM

## 2010-09-06 NOTE — Progress Notes (Signed)
Summary: 1 month reminder for CBC  Phone Note Outgoing Call Call back at [facility] Phone [phone]   Call placed by: Merri Ray CMA Duncan Dull),  October 07, 2009 9:04 AM Summary of Call: Reminder call for pt to come in and have 1 month F/U CBC drawn. L/M for pt to return call if he has any questions.... Orders in IDX Initial call taken by: Merri Ray CMA [facility]),  October 07, 2009 9:04 AM

## 2010-09-06 NOTE — Progress Notes (Signed)
Summary: Change from Hyomax to Bentyl  ---- Converted from flag ---- ---- 09/09/2009 9:02 AM, Louis Meckel MD wrote: Is there a problem with  hyomax?  He can use Bentyl 10 mg q.6 h. p.r.n. as an alternative  ---- 09/08/2009 4:49 PM, Merri Ray CMA (AAMA) wrote: Is there another med besides the Hyomax that you want to prescribe? ------------------------------  Phone Note Outgoing [facility]   Call placed by: Merri Ray CMA Duncan Dull),  September 09, 2009 9:06 AM Summary of Call: Infrormed pharmacy of change from Hyomax to Bentyl.  Initial call taken by: Merri Ray CMA [facility]),  September 09, 2009 9:07 AM

## 2010-09-06 NOTE — Progress Notes (Signed)
Summary: Hyomax Prescription  Phone Note From Pharmacy   Caller: [facility]  [phone] Call For: Dr. Arlyce Dice  Summary of Call: Pt.'s Hyomax has a drug interaction between potassium and the Hyomax Initial call taken by: Karna Christmas,  September 08, 2009 3:15 PM  Follow-up for Phone Call        FYI-[name] Follow-up by: Merri Ray CMA [facility] Dull),  September 08, 2009 3:38 PM  Additional Follow-up for Phone Call Additional follow up Details #1::        I'm not aware of that Additional Follow-up by: Louis Meckel MD,  September 08, 2009 4:44 PM

## 2010-09-06 NOTE — Progress Notes (Signed)
Summary: TRIAGE  Phone Note From Pharmacy   Caller: Arlys John @ [facility] [phone] Call For: [name]  Summary of Call: Still an interaction with his meds. Initial call taken by: Leanor Kail [facility],  September 09, 2009 3:20 PM  Follow-up for Phone Call        [name] - I told the pharmacy to prescribe Bentyl but they called back and said there is still a interaction with his other medications. What do you want to do? Follow-up by: Merri Ray CMA [facility] Dull),  September 09, 2009 3:34 PM  Additional Follow-up for Phone Call Additional follow up Details #1::        hyomax should not be taken if he takes bentyl.  I'm not aware of other interactions.  Let the pharmacist specify Additional Follow-up by: Louis Meckel MD,  September 10, 2009 9:55 AM    Additional Follow-up for Phone Call Additional follow up Details #2::    Per pt. pharmacist:The anticholergenics all bring up a warning about delayed gastric emptying and concerns for his K+ absorption. No alternative meds are recommended.   [facility] PLEASE ADVISE  Follow-up by: Laureen Ochs LPN,  September 10, 2009 10:29 AM  Additional Follow-up for Phone Call Additional follow up Details #3:: Details for Additional Follow-up Action Taken: it's ok to take bentyl-per Dr.Cas Tracz. Pt. and pharmacy aware. Pt. instructed to call back as needed.  Additional Follow-up by: Laureen Ochs LPN,  September 10, 2009 10:48 AM  New/Updated Medications: BENTYL 10 MG  CAPS (DICYCLOMINE HCL) Take 1 every 6 hours as needed for abd. pain. Prescriptions: BENTYL 10 MG  CAPS (DICYCLOMINE HCL) Take 1 every 6 hours as needed for abd. pain.  #30 x 1   Entered by:   Laureen Ochs LPN   Authorized by:   Louis Meckel MD   Signed by:   Laureen Ochs LPN on 46/96/2952   Method used:   Historical   RxID:   [id number]

## 2010-09-06 NOTE — Letter (Signed)
Summary: Appt Reminder 2  [facility]  [address] [place], [place] [address]   Phone: [phone]  Fax: [phone]        January 25, 2010 MRN: [id number]    [facility] [address] [place], [place]  [address]    Dear Mr. Pankowski,   You have a return appointment with Dr.Robert Arlyce Dice on 02-11-10 at 9:15am. Please remember to bring a complete list of the medicines you are taking, your insurance card and your co-pay.  If you have to cancel or reschedule this appointment, please call before 5:00 pm the evening before to avoid a cancellation fee.  If you have any questions or concerns, please call [phone].    Sincerely,    Laureen Ochs LPN  Appended Document: Appt Reminder 2 Letter mailed to patient.

## 2010-09-06 NOTE — Progress Notes (Signed)
Summary: RX 90 DAYS INSTEAD OF 30 DAYS  Phone Note Refill Request Message from:  Pharmacy  Refills Requested: Medication #1:  SPIRONOLACTONE 25 MG TABS 1 by mouth once daily NEED TO BE A 90 DAY SUPPLY INSTEAD OF 30 DAY FOR PHARMACY TO FILL. PLEASE SEND A NEW SCRIPT  Initial call taken by: Freddy Jaksch,  December 03, 2009 4:14 PM    Prescriptions: SPIRONOLACTONE 25 MG TABS (SPIRONOLACTONE) 1 by mouth once daily  #90 x 0   Entered by:   Shary Decamp   Authorized by:   Nolon Rod. Paz MD   Signed by:   Shary Decamp on 12/03/2009   Method used:   [facility] [phone]* (retail)       [address]       [place], [place]  [address]       Ph: [phone] or [phone]       Fax: [phone]   RxID:   [id number]

## 2010-09-06 NOTE — Progress Notes (Signed)
Summary: Lab Reminder  Phone Note Outgoing Call Call back at [facility] Phone [phone]   Call placed by: Merri Ray CMA [facility] Dull),  May 09, 2010 10:57 AM Summary of Call: Called pt to remind him that CBC and LFT's are due for [name]. Pt stated he would be in one day this week Initial call taken by: Merri Ray CMA [facility] Dull),  May 09, 2010 10:58 AM

## 2010-09-06 NOTE — Letter (Signed)
[facility]   Imported By: Sherian Rein 03/10/2010 13:23:28  _____________________________________________________________________  External Attachment:    Type:   Image     Comment:   External Document

## 2010-09-06 NOTE — Progress Notes (Signed)
Summary: Refill Request  Phone Note Refill Request Call back at [phone] Message from:  Pharmacy on December 03, 2009 8:54 AM  Refills Requested: Medication #1:  SPIRONOLACTONE 25 MG TABS 1 by mouth once daily   Dosage confirmed as above?Dosage Confirmed   [facility]ed[facility]   Next Appointment Scheduled: 6.29.11 Initial call taken by: Harold Barban,  December 03, 2009 8:54 AM    Prescriptions: SPIRONOLACTONE 25 MG TABS (SPIRONOLACTONE) 1 by mouth once daily  #30 x 3   Entered by:   Shary Decamp   Authorized by:   Nolon Rod. Paz MD   Signed by:   Shary Decamp on 12/03/2009   Method used:   [facility]        [facility] [phone]* (retail)       [address]       [place], [place]  [address]       Ph: [phone] or [phone]       Fax: [phone]   RxID:   [id number]

## 2010-09-06 NOTE — Assessment & Plan Note (Signed)
Summary: 6-MONTH F/U APPT...LSW.   History of Present Illness Visit Type: follow up Primary GI MD: Melvia Heaps MD [facility] Primary Provider: [name], MD Requesting Provider: n/a Chief Complaint: Patient here for 2 month f/u. Patient still complains of LLQ abdominal pain. History of Present Illness:   [name] has returned for followup of his Crohn's disease.  He remains on 20 mg of prednisone daily despite attempts to taper him in the past.  This is probably due  to a mixup.  He is having normal stools but complains of occasional left lower quadrant pain.  He remains anemic.  Hemoglobin on August 12, 2009 was 9.9.  He has had no overt bleeding or melena   GI Review of Systems    Reports abdominal pain and  bloating.     Location of  Abdominal pain: LLQ.    Denies acid reflux, belching, chest pain, dysphagia with liquids, dysphagia with solids, heartburn, loss of appetite, nausea, vomiting, vomiting blood, weight loss, and  weight gain.        Denies anal fissure, black tarry stools, change in bowel habit, constipation, diarrhea, diverticulosis, fecal incontinence, heme positive stool, hemorrhoids, irritable bowel syndrome, jaundice, light color stool, liver problems, rectal bleeding, and  rectal pain.    Current Medications (verified): 1)  Azathioprine 50 Mg Tabs (Azathioprine) .... Take 1/2 Tablet By Mouth Once Daily 2)  Prednisone 20 Mg Tabs (Prednisone) .... Take 2 Tablets By Mouth Once Daily As Needed 3)  Hyoscyamine Sulfate 0.125 Mg  Subl (Hyoscyamine Sulfate) .... Put One Under Your Tongue Every 4 Hours As Needed For Abdominal Pain. 4)  Atenolol 50 Mg Tabs (Atenolol) .Marland Kitchen.. 1po Once Daily As Needed 5)  Spironolactone 25 Mg Tabs (Spironolactone) .Marland Kitchen.. 1 By Mouth Once Daily  Allergies (verified): 1)  ! * Asacol  Past History:  Past Medical History: Reviewed history from 03/19/2009 and no changes required. CROHNS DISEASE ;S/P PERIANAL ABSCESS 2001,S/P RIGHT HEMICOLECTOMY ,AND  DRAINAGE OF RETROPERITONEAL ABSCESS 2003 Hypertension hearing loss  Past Surgical History: Reviewed history from 03/19/2009 and no changes required. rt hemicolectomy 08/29/2001 (perforated abscess) ANAL FISTULOTOMY 2002  Family History: Reviewed history from 03/02/2009 and no changes required. CAD - no DM - aunt stroke - GF, aunts colon ca - no prostate Ca - no HTN -- "the whole family"  Social History: Reviewed history from 06/29/2009 and no changes required. not married no children lives by himself Alcohol use-yes (3 beers daily)---denies ETOH 06-29-09 Drug use-no Regular exercise-no works @ [employer]  Patient is a former smoker.   Review of Systems  The patient denies allergy/sinus, anemia, anxiety-new, arthritis/joint pain, back pain, blood in urine, breast changes/lumps, change in vision, confusion, cough, coughing up blood, depression-new, fainting, fatigue, fever, headaches-new, hearing problems, heart murmur, heart rhythm changes, itching, menstrual pain, muscle pains/cramps, night sweats, nosebleeds, pregnancy symptoms, shortness of breath, skin rash, sleeping problems, sore throat, swelling of feet/legs, swollen lymph glands, thirst - excessive , urination - excessive , urination changes/pain, urine leakage, vision changes, and voice change.    Vital Signs:  Patient profile:   55 year old male Height:      70 inches Weight:      182 pounds BMI:     26.21 BSA:     2.01 Pulse rate:   76 / minute Pulse rhythm:   regular BP sitting:   138 / 82  (left arm)  Vitals Entered By: Hortense Ramal CMA [facility] Dull) (September 08, 2009 10:25 AM)  Impression & Recommendations:  Problem # 1:  CROHN'S DISEASE-LARGE INTESTINE (ICD-555.1) Assessment Unchanged Symptoms are well-controlled though he does have occasional pain.  Recommendations #1 prednisone taper #2 continue azathioprine #3 hyomax p.r.n. pain  Problem # 2:  ANEMIA OF CHRONIC DISEASE (ICD-285.29) Etiology is  probably multifactorial including bone marrow suppression from azathioprine.  He has no overt GI bleeding.  Recommendations #1 begin iron supplementation Prescriptions: FEOSOL 200 (65 FE) MG TABS (FERROUS SULFATE DRIED) take one tab daily  #30 x 2   Entered and Authorized by:   Louis Meckel MD   Signed by:   Louis Meckel MD on 09/08/2009   Method used:   [facility]        [facility] [facility] [address] [facility] [address] [phone]* (retail)       [address]       [place], [place]  [address]       Ph: [phone] or [phone]       Fax: [phone]   RxID:   [id number] HYOMAX-DT 0.375 MG CR-TABS (HYOSCYAMINE SULFATE) takes one tab b.i.d. p.r.n. abdominal pain  #25 x 2   Entered and Authorized by:   Louis Meckel MD   Signed by:   Louis Meckel MD on 09/08/2009   Method used:   [facility]        [facility] [address] [phone]* (retail)       [address]       [place], [place]  [address]       Ph: [phone] or [phone]       Fax: [phone]   RxID:   [id number]   Appended Document: 64-MONTH F/U APPT...LSW. Prednisone Taper: 10mg  alternating with 5mg  for 7 days 5mg  tablet everyday for 7 days 5mg  tablet every other day for 7 days then discontine prednisone instruction written and given to pt

## 2010-09-06 NOTE — Assessment & Plan Note (Signed)
Summary: bp/bmp/swh  Nurse Visit   Vital Signs:  Patient profile:   55 year old male Weight:      171.4 pounds Pulse rate:   70 / minute BP sitting:   110 / 72  Vitals Entered By: Kandice Hams (August 25, 2009 9:59 AM)  Impression & Recommendations:  Problem # 1:  HYPERTENSION (ICD-401.9)  His updated medication list for this problem includes:    Tenoretic 50 50-25 Mg Tabs (Atenolol-chlorthalidone) .Marland Kitchen... 1 by mouth once daily - keep appt on 08/25/09 @ 10am  Orders: Venipuncture ([address]) TLB-BMP (Basic Metabolic Panel-BMET) (80048-METABOL)  BP today: 110/72 Prior BP: 126/74 (07/06/2009)  Labs Reviewed: K+: 3.6 (06/29/2009) Creat: : 0.8 (06/29/2009)   Chol: 209 (02/26/2008)   HDL: 34 (02/26/2008)   LDL: 142 (02/26/2008)   TG: 167 (02/26/2008)  Complete Medication List: 1)  Azathioprine 50 Mg Tabs (Azathioprine) .... Take 1 tablet by mouth once a day 2)  Klor-con 20 Meq Pack (Potassium chloride) .... 2 by mouth once daily 3)  Prednisone 20 Mg Tabs (Prednisone) .... Take 2 tablets by mouth once daily 4)  Hyoscyamine Sulfate 0.125 Mg Subl (Hyoscyamine sulfate) .... Put one under your tongue every 4 hours as needed for abdominal pain. 5)  Tenoretic 50 50-25 Mg Tabs (Atenolol-chlorthalidone) .Marland Kitchen.. 1 by mouth once daily - keep appt on 08/25/09 @ 10am  CC: bp ck   Allergies: 1)  ! * Asacol  Orders Added: 1)  Venipuncture [36415] 2)  TLB-BMP (Basic Metabolic Panel-BMET) [80048-METABOL] 3)  Est. Patient Level I [[address]]

## 2010-09-06 NOTE — Letter (Signed)
Summary: Out of Work  [facility] [employer]  [address] [place], [place] [address]   Phone: [phone]  Fax: [phone]    May 18, 2010   Employee:  [employer]    To Whom It May Concern:   For Medical reasons, please excuse the above named employee from work for the following dates:  Start:   05/09/10  End:   05/25/10  If you need additional information, please feel free to contact our office.         Sincerely,   [name], MD

## 2010-09-06 NOTE — Progress Notes (Signed)
Summary: awaiting pt call--  Phone Note Call from Patient   Summary of Call: Patient mother called stating that the patient needed to be seen, he has lost a lot of weight and something needs to be done. I made her aware that the patient can call the office to discuss/sch office visit. (she is not on DPR). Initial call taken by: [name] CMA,  May 03, 2010 12:31 PM  Follow-up for Phone Call        I have not heard back from patient, left message on voicemail to call back to office. [name] CMA  May 05, 2010 9:52 AM    Additional Follow-up for Phone Call Additional follow up Details #1::        pt dad called back stating that he was returning call, advised dad that our initial call was in response to a message we received from his wife regarding pt. Pt dad advise pt will need appt to discuss further, pt dad state that he will have to have pt review schedule and call back to schedule appt..................Marland KitchenFelecia Deloach CMA  May 05, 2010 1:47 PM

## 2010-09-08 NOTE — Letter (Signed)
Summary: anemia and thrombocytosis---Cancer Center  [facility]   Imported By: Lanelle Bal 07/13/2010 14:59:09  _____________________________________________________________________  External Attachment:    Type:   Image     Comment:   External Document

## 2010-09-27 ENCOUNTER — Other Ambulatory Visit: Payer: Self-pay | Admitting: Internal Medicine

## 2010-09-27 ENCOUNTER — Encounter (HOSPITAL_BASED_OUTPATIENT_CLINIC_OR_DEPARTMENT_OTHER): Payer: No Typology Code available for payment source | Admitting: Internal Medicine

## 2010-09-27 DIAGNOSIS — D473 Essential (hemorrhagic) thrombocythemia: Secondary | ICD-10-CM

## 2010-09-27 DIAGNOSIS — D509 Iron deficiency anemia, unspecified: Secondary | ICD-10-CM

## 2010-09-27 LAB — CBC & DIFF AND RETIC
BASO%: 0.7 % (ref 0.0–2.0)
Eosinophils Absolute: 0 10*3/uL (ref 0.0–0.5)
MCHC: 31.9 g/dL — ABNORMAL LOW (ref 32.0–36.0)
MONO#: 0.5 10*3/uL (ref 0.1–0.9)
MONO%: 6.9 % (ref 0.0–14.0)
NEUT#: 4.7 10*3/uL (ref 1.5–6.5)
RBC: 3.96 10*6/uL — ABNORMAL LOW (ref 4.20–5.82)
RDW: 17.4 % — ABNORMAL HIGH (ref 11.0–14.6)
Retic %: 1.34 % (ref 0.50–1.60)
Retic Ct Abs: 53.06 10*3/uL (ref 24.10–77.50)
WBC: 6.8 10*3/uL (ref 4.0–10.3)

## 2010-09-27 LAB — TECHNOLOGIST REVIEW

## 2010-09-27 LAB — IRON AND TIBC
%SAT: 29 % (ref 20–55)
Iron: 70 ug/dL (ref 42–165)

## 2010-10-10 ENCOUNTER — Other Ambulatory Visit: Payer: Self-pay | Admitting: Gastroenterology

## 2010-10-10 DIAGNOSIS — K6389 Other specified diseases of intestine: Secondary | ICD-10-CM

## 2010-10-13 ENCOUNTER — Encounter (HOSPITAL_BASED_OUTPATIENT_CLINIC_OR_DEPARTMENT_OTHER): Payer: No Typology Code available for payment source | Admitting: Internal Medicine

## 2010-10-13 DIAGNOSIS — D473 Essential (hemorrhagic) thrombocythemia: Secondary | ICD-10-CM

## 2010-10-14 ENCOUNTER — Ambulatory Visit
Admission: RE | Admit: 2010-10-14 | Discharge: 2010-10-14 | Disposition: A | Discharge status: Home / Self Care | Payer: No Typology Code available for payment source | Source: Ambulatory Visit | Attending: Gastroenterology | Admitting: Gastroenterology

## 2010-10-14 DIAGNOSIS — K6389 Other specified diseases of intestine: Secondary | ICD-10-CM

## 2010-10-14 MED ORDER — IOHEXOL 300 MG/ML  SOLN
100.0000 mL | Freq: Once | INTRAMUSCULAR | Status: AC | PRN
  Administered 2010-10-14: 100 mL via INTRAVENOUS

## 2010-10-19 LAB — CBC
HCT: 25 % — ABNORMAL LOW (ref 39.0–52.0)
Hemoglobin: 7.8 g/dL — ABNORMAL LOW (ref 13.0–17.0)
Hemoglobin: 8.1 g/dL — ABNORMAL LOW (ref 13.0–17.0)
MCH: 24.5 pg — ABNORMAL LOW (ref 26.0–34.0)
MCH: 24.6 pg — ABNORMAL LOW (ref 26.0–34.0)
MCHC: 32.1 g/dL (ref 30.0–36.0)
MCHC: 32.4 g/dL (ref 30.0–36.0)
MCV: 76 fL — ABNORMAL LOW (ref 78.0–100.0)
MCV: 76 fL — ABNORMAL LOW (ref 78.0–100.0)
MCV: 76.2 fL — ABNORMAL LOW (ref 78.0–100.0)
Platelets: 521 10*3/uL — ABNORMAL HIGH (ref 150–400)
Platelets: 565 10*3/uL — ABNORMAL HIGH (ref 150–400)
RBC: 3.19 MIL/uL — ABNORMAL LOW (ref 4.22–5.81)
RDW: 18.6 % — ABNORMAL HIGH (ref 11.5–15.5)
RDW: 19.5 % — ABNORMAL HIGH (ref 11.5–15.5)
WBC: 5 10*3/uL (ref 4.0–10.5)

## 2010-10-19 LAB — COMPREHENSIVE METABOLIC PANEL
BUN: 5 mg/dL — ABNORMAL LOW (ref 6–23)
CO2: 31 mEq/L (ref 19–32)
Chloride: 97 mEq/L (ref 96–112)
Creatinine, Ser: 0.66 mg/dL (ref 0.4–1.5)
GFR calc non Af Amer: >60 mL/min (ref >60)
Total Bilirubin: 0.6 mg/dL (ref 0.3–1.2)

## 2010-10-19 LAB — BASIC METABOLIC PANEL
BUN: 5 mg/dL — ABNORMAL LOW (ref 6–23)
BUN: 6 mg/dL (ref 6–23)
CO2: 31 mEq/L (ref 19–32)
Calcium: 7.5 mg/dL — ABNORMAL LOW (ref 8.4–10.5)
Calcium: 7.5 mg/dL — ABNORMAL LOW (ref 8.4–10.5)
Chloride: 101 mEq/L (ref 96–112)
Creatinine, Ser: 0.62 mg/dL (ref 0.4–1.5)
GFR calc non Af Amer: >60 mL/min (ref >60)
GFR calc non Af Amer: >60 mL/min (ref >60)
GFR calc non Af Amer: >60 mL/min (ref >60)
Glucose, Bld: 118 mg/dL — ABNORMAL HIGH (ref 70–99)
Glucose, Bld: 95 mg/dL (ref 70–99)
Potassium: 2.4 mEq/L — CL (ref 3.5–5.1)
Sodium: 132 mEq/L — ABNORMAL LOW (ref 135–145)
Sodium: 140 mEq/L (ref 135–145)

## 2010-10-19 LAB — HEPATIC FUNCTION PANEL
ALT: 10 U/L (ref 0–53)
Alkaline Phosphatase: 83 U/L (ref 39–117)
Indirect Bilirubin: 0.3 mg/dL (ref 0.3–0.9)
Total Bilirubin: 0.4 mg/dL (ref 0.3–1.2)
Total Protein: 5.3 g/dL — ABNORMAL LOW (ref 6.0–8.3)

## 2010-10-19 LAB — TSH: TSH: 0.706 u[IU]/mL (ref 0.350–4.500)

## 2010-10-19 LAB — DIFFERENTIAL
Basophils Absolute: 0 10*3/uL (ref 0.0–0.1)
Lymphocytes Relative: 11 % — ABNORMAL LOW (ref 12–46)
Neutro Abs: 2.9 10*3/uL (ref 1.7–7.7)

## 2010-10-19 LAB — POTASSIUM: Potassium: 3.1 mEq/L — ABNORMAL LOW (ref 3.5–5.1)

## 2010-10-19 LAB — PHOSPHORUS: Phosphorus: 2.9 mg/dL (ref 2.3–4.6)

## 2010-10-19 LAB — MAGNESIUM: Magnesium: 1.9 mg/dL (ref 1.5–2.5)

## 2010-10-23 LAB — CBC
HCT: 22.3 % — ABNORMAL LOW (ref 39.0–52.0)
HCT: 24.1 % — ABNORMAL LOW (ref 39.0–52.0)
Hemoglobin: 7.7 g/dL — ABNORMAL LOW (ref 13.0–17.0)
MCHC: 32.8 g/dL (ref 30.0–36.0)
MCV: 80.8 fL (ref 78.0–100.0)
MCV: 80.9 fL (ref 78.0–100.0)
MCV: 81.8 fL (ref 78.0–100.0)
Platelets: 551 10*3/uL — ABNORMAL HIGH (ref 150–400)
Platelets: 621 10*3/uL — ABNORMAL HIGH (ref 150–400)
RBC: 3.78 MIL/uL — ABNORMAL LOW (ref 4.22–5.81)
RBC: 3.8 MIL/uL — ABNORMAL LOW (ref 4.22–5.81)
RDW: 21 % — ABNORMAL HIGH (ref 11.5–15.5)
WBC: 5.8 10*3/uL (ref 4.0–10.5)
WBC: 5.9 10*3/uL (ref 4.0–10.5)
WBC: 6.2 10*3/uL (ref 4.0–10.5)
WBC: 7.1 10*3/uL (ref 4.0–10.5)

## 2010-10-23 LAB — BASIC METABOLIC PANEL
BUN: 12 mg/dL (ref 6–23)
BUN: 4 mg/dL — ABNORMAL LOW (ref 6–23)
Calcium: 8 mg/dL — ABNORMAL LOW (ref 8.4–10.5)
Chloride: 106 mEq/L (ref 96–112)
Chloride: 112 mEq/L (ref 96–112)
Creatinine, Ser: 0.74 mg/dL (ref 0.4–1.5)
Creatinine, Ser: 0.93 mg/dL (ref 0.4–1.5)
GFR calc non Af Amer: >60 mL/min (ref >60)
GFR calc non Af Amer: >60 mL/min (ref >60)
GFR calc non Af Amer: >60 mL/min (ref >60)
Glucose, Bld: 126 mg/dL — ABNORMAL HIGH (ref 70–99)
Potassium: 3 mEq/L — ABNORMAL LOW (ref 3.5–5.1)
Potassium: 3.6 mEq/L (ref 3.5–5.1)
Sodium: 145 mEq/L (ref 135–145)

## 2010-10-23 LAB — DIFFERENTIAL
Lymphocytes Relative: 23 % (ref 12–46)
Lymphs Abs: 1.3 10*3/uL (ref 0.7–4.0)
Neutro Abs: 3.5 10*3/uL (ref 1.7–7.7)
Neutrophils Relative %: 59 % (ref 43–77)

## 2010-10-23 LAB — TYPE AND SCREEN
ABO/RH(D): O POS
Antibody Screen: NEGATIVE

## 2010-10-23 LAB — PREPARE RBC (CROSSMATCH)

## 2010-10-23 LAB — HEMOGLOBIN AND HEMATOCRIT, BLOOD
HCT: 23.7 % — ABNORMAL LOW (ref 39.0–52.0)
HCT: 26.7 % — ABNORMAL LOW (ref 39.0–52.0)
Hemoglobin: 8.6 g/dL — ABNORMAL LOW (ref 13.0–17.0)
Hemoglobin: 9.9 g/dL — ABNORMAL LOW (ref 13.0–17.0)

## 2010-10-23 LAB — POCT CARDIAC MARKERS: Troponin i, poc: <0.05 ng/mL (ref 0.00–0.09)

## 2010-10-23 LAB — PROTIME-INR
INR: 1.12 (ref 0.00–1.49)
Prothrombin Time: 14.3 seconds (ref 11.6–15.2)

## 2010-10-23 LAB — ABO/RH: ABO/RH(D): O POS

## 2010-12-06 ENCOUNTER — Other Ambulatory Visit: Payer: Self-pay | Admitting: Internal Medicine

## 2010-12-06 NOTE — Telephone Encounter (Signed)
30, no RF 

## 2010-12-12 ENCOUNTER — Other Ambulatory Visit: Payer: Self-pay | Admitting: Internal Medicine

## 2010-12-23 NOTE — Op Note (Signed)
[place]  Patient:    Rick Edwards, Rick Edwards                    MRN: [id number] Proc. Date: 12/25/00 Adm. Date:  [id number] Attending:  Glenna Fellows Tappan                           Operative Report  PREOPERATIVE DIAGNOSIS:  Fistula in ano.  POSTOPERATIVE DIAGNOSIS:  Fistula in ano.  OPERATION PERFORMED:  Anal fistulotomy.  SURGEON:  Lorne Skeens. Hoxworth, M.D.  ANESTHESIA:  General.  INDICATIONS FOR PROCEDURE:  The patient is a 55 year old black male who recently presented with a perianal abscess treated with incision and drainage under local anesthesia.  He has healed down to a chronic fistula tract in the left anterior position and anal fistulotomy has been recommended and accepted. The nature of the procedure, its indications and risks of bleeding, infection and recurrence were discussed and understood preoperatively. He is now brought to the operating room for this procedure.  DESCRIPTION OF PROCEDURE:  The patient was brought to the operating room and placed in supine position on the operating table and general anesthesia was induced.  He was carefully positioned in the lithotomy position and the perineum sterilely prepped and draped. He had received antibiotics preoperatively.  A probe was very easily passed through the external opening to the obvious internal opening in the anus.  This traversed only subcutaneous tissues.  All tissue between the two openings was divided down to the probe. Hemostasis was obtained with the cautery.  The tissue was infiltrated with Marcaine.  A saline gauze dressing was applied.  The patient was taken to the recovery room in good condition. DD:  12/25/00 TD:  12/25/00 Job: [id number]

## 2010-12-23 NOTE — Discharge Summary (Signed)
[facility]  Patient:    Rick Edwards, Rick Edwards                    MRN: [id number] Adm. Date:  [id number] Disc. Date: [id number] Attending:  Delsa Bern CC:         Everardo All. Madilyn Fireman, M.D.  Reuben Likes, M.D.   Discharge Summary  DISCHARGE DIAGNOSIS:  Crohns disease of the terminal ileum with fistulization and retroperitoneal abscess.  PROCEDURES:  Percutaneous drainage of retroperitoneal abscess on January 10, 2001.  CONSULTANTS:  John C. Madilyn Fireman, M.D.  HISTORY OF PRESENT ILLNESS:  Rick Edwards is a 55 year old black male, previously in good health, who presents with approximately three-week history of gradually worsening right lower quadrant abdominal pain.  He describes constant discomfort which is not severe but is gradually worsening.  He has also noticed low grade fever over the past few days.  Of note, he did present with anal abscess for approximately one month ago and underwent I&D of his abscess and anal fistulotomy.  He has no previous history of any GI disease.  No urinary symptoms.  He presented to Dr. Ginny Forth office today and was felt to have right lower quadrant tenderness, associated mass, and is now referred to [facility] for further evaluation.  PAST MEDICAL HISTORY:  Entirely unremarkable except as above.  He is markedly hearing impaired and communicates mainly by reading lips.  PAST SURGICAL HISTORY:  No previous surgeries, medical illnesses, or hospitalization.  MEDICATIONS:  None.  ALLERGIES:  None.  SOCIAL HISTORY:  See detailed H&P.  FAMILY HISTORY:  See detailed H&P.  REVIEW OF SYSTEMS:  See detailed H&P.  PHYSICAL EXAMINATION:  VITAL SIGNS:  Temperature 100.8, pulse 110, respirations 18, blood pressure 155/84.  GENERAL:  In general, he is a thin black male who appears mildly ill.  ABDOMEN:  Pertinent findings are limited to the abdomen which revealed localized tenderness in the right lower  quadrant and a palpable mass.  No peritoneal signs.  Minimal guarding.  The remainder of his abdomen is soft and nontender.  RECTAL:  A healing fistulotomy site in the lateral anus without evidence of infection.  LABORATORY DATA:  White count was 10.9 thousand, hemoglobin 8.6.  Albumin 2.9. LFTs abnormal with an alkaline phosphatase of 198.  CT scan of the abdomen and pelvis was obtained through the emergency room which revealed a large phlegma on the right lower quadrant with markedly thickened cecum and terminal ileum with evidence of fistulization into the retroperitoneum and a several centimeter abscess in the right psoas.  HOSPITAL COURSE:  The clinical picture and CT scan was felt to be very consistent with Crohns disease with phlegma, fistula, and abscess.  The patient was admitted and started on IV Cipro and Flagyl.  Dr. Everardo All. Madilyn Fireman saw the patient in consultation.  He felt that treatment with steroids would be indicated after treating the patients acute infection.  Radiology was consulted and a CT-guided percutaneous catheter drainage of his psoas abscess was performed on January 10, 2001.  Large amount of purulent material was drained with some immediate improvement in his pain and tenderness.  The patient was made NPO and a central line PICC catheter was placed and he was started on TNA.  With this treatment, the patient slowly but steadily clinically improved.  He had some low grade fever that gradually subsided. His pain and tenderness steadily improved.  By hospital day #5, his white count was down  to 7.8 and he had very mild right lower quadrant tenderness.  A CT scan was repeated on January 16, 2001.  He was still having about 30 cc per day of drainage and the catheter was initially left in place at this time.  By January 17, 2001, hospital day #8, there was minimal catheter drainage and it was removed.  Tenderness was much improved.  He was begun on a liquid diet which  was able to be gradually advanced.  On January 18, 2001, he was started on Entocort and Pentasa.  His diet was able to be gradually advanced without difficulty and his pain and tenderness continued to improve.  His TNA was discontinued on January 20, 2001.  He was continued on IV antibiotics throughout this course.  By January 23, 2001, he was felt to be improved enough to be stable for discharge.  He was afebrile.  He had no significant pain.  He had very minimal tenderness and a palpable mass that essentially resolved.  He was discharged home on p.o. Cipro and Flagyl which was continued for one more week.  He was also on Asacol 400 mg two tablets three times a day and Entocort 9 mg once daily.  FOLLOW-UP:  Follow up is to be in my office in two weeks.  CONDITION ON DISCHARGE:  Improved. DD:  02/03/01 TD:  02/03/01 Job: [id number]

## 2010-12-23 NOTE — H&P (Signed)
Rick Edwards  Patient:    Rick Edwards, Rick Edwards                      MRN: [id number] Adm. Date:  01/09/01 Attending:  Sharlet Salina T. Hoxworth, M.D.                         History and Physical  CHIEF COMPLAINT:  Abdominal pain.  HISTORY OF PRESENT ILLNESS:  Rick Edwards is a 55 year old black male, previously in good Edwards, who presents with an approximately three-week history of gradually worsening right lower quadrant abdominal pain.  He describes a constant discomfort in the right lower quadrant, which is not severe but has become gradually worse over the last three weeks.  He has noticed a little fever in recent days.  He denies any nausea or vomiting. Bowel movements are normal.  Of note is he did present with an anal abscess approximately one month ago and subsequently underwent I&D of his abscess and anal fistulotomy.  He has no previous history of any GI disease.  No urinary symptoms.  He presented to Dr. Ginny Forth office today and was felt to have right lower quadrant tenderness and an associated mass and is now evaluated at [facility].  PAST MEDICAL HISTORY:  Entirely unremarkable except as above.  He is markedly hearing-impaired and communicates with reading lips.  No previous surgery, medical illnesses, hospitalization.  MEDICATIONS:  On no medications.  ALLERGIES:  No allergies.  SOCIAL HISTORY:  He is single.  He is employed.  Accompanied by his father. He smokes part of a pack of cigarettes per day and quit drinking two to three years ago.  FAMILY HISTORY:  Parents alive and well, noncontributory.  REVIEW OF SYSTEMS:  GENERAL:  Positive for approximately 15-pound weight loss in the past couple of month.  HEENT:  Positive for marked hearing loss. CARDIOVASCULAR:  Denies chest pain, palpitations.  PULMONARY:  Denies cough, shortness of breath.  SKIN:  Denies rash or skin lesions.  MUSCULOSKELETAL: Denies joint pain.  PHYSICAL  EXAMINATION:  VITAL SIGNS:  Temperature is 100.8, pulse is 110, respirations 18, blood pressure 155/84.  GENERAL:  He is a thin black male who appears mildly ill.  SKIN:  Warm and dry without rash or infection.  HEENT:  Sclerae are clear.  NECK:  No adenopathy.  CHEST:  Lungs clear to auscultation.  CARDIAC:  A regular tachycardia without rubs or murmurs.  No JVD or edema.  ABDOMEN:  There was localized tenderness in the right lower quadrant and an associated palpable mass.  No peritoneal signs or guarding.  The remainder of the abdomen is soft and nontender.  No distention.  RECTAL:  There is a healing fistulotomy site in the lateral anus without evidence of infection.  EXTREMITIES:  No edema or deformity.  NEUROLOGIC:  He is markedly hard of hearing.  Motor and sensory exam are grossly normal.  LABORATORY DATA:  White count is 10.9 thousand, hemoglobin depressed at 8.6. Electrolytes are normal with the exception of a sodium of 134.  Albumin is 2.9.  LFTs abnormal for alkaline phosphatase of 198.  Amylase 36.  A CT scan of the abdomen and pelvis was obtained through the emergency room. This shows a large phlegmon in the right lower quadrant with a markedly thickened cecum and terminal ileum.  There is evidence of fistulization into the retroperitoneum and a several centimeter abscess in the retroperitoneum and the  right psoas.  ASSESSMENT AND PLAN:  Clinically very consistent with Crohns disease, with phlegmon, fistula, and abscess.  This would explain his recent anal fistula as well.  Of note is the appendix is also seen on his CT scan, which appears normal.  The patient will be admitted for IV antibiotics.  Will plan percutaneous drainage of his retroperitoneal abscess.  He may need IV nutritional support.  GI will be consulted for medical management of his Crohns disease.  Will reserve surgery for failure of medical management. DD:  01/09/01 TD:  01/10/01 Job:  [id number]

## 2010-12-23 NOTE — H&P (Signed)
[place]  Patient:    Rick Edwards Visit Number: [id number] MRN: [id number]          Service Type: MED Location: [phone] Attending Physician:  Roque Lias Dictated by:   Reuben Likes, M.D. Admit Date:  08/25/2001                           History and Physical  CHIEF COMPLAINT:  Severe pain in the right side.  HISTORY OF PRESENT ILLNESS:  Rick Edwards was a 55 year old male with a history of Crohns disease who has had a 1 day history of severe pain in the right flank and right lower quadrant radiating to the right leg worse with hip flexion. The patient has had mild pain in the same area for the past 2 weeks and was scheduled for an upper GI and small-bowel follow-through tomorrow.  He was on a clear liquid diet in preparation for the procedure.  The patient states that today he has felt feverish and chilled.  He denies nausea or vomiting but does not have much of an appetite.  He denies diarrhea or blood in the stool and has had no urinary symptoms.  PAST MEDICAL HISTORY:  SURGERIES: None.  OTHER HOSPITALIZATION:  The patient was hospitalized at [facility] in June of 2002, under the care of Dr. Johna Sheriff for a similar episode.  At that time he was found to have an abscess in the right lower quadrant secondary to Crohns disease and he was treated with IV Cipro and Flagyl and a CT-guided percutaneous catheter was placed in the psoas abscess and was drained.  He was also made n.p.o. and a central PIC-catheter was placed, and he was started on TNA.  He slowly improved and he was discharged after about 2 weeks in the hospital.  Since then he has been followed by Dr. Madilyn Fireman.  OTHER ILLNESSES:  He has had high blood pressure also diagnosed in August of last year.  He also has had perianal fistulas which were thought to be due to his Crohns disease.  MEDICATIONS: 1. Entocort EC 3 mg 3 per day. 2. Asacol 400 mg 2  t.i.d. 3. Atenolol 50 mg a day. 4. Hydrochlorothiazide 25 mg a day.  ALLERGIES:  None.  FAMILY HISTORY:  His mother, father, and brother and sister are all alive and in good health.  He has no children.  SOCIAL HISTORY:  He is single, living with his father.  He smokes about 8 cigarettes a day but he did quit recently.  He also quit drinking as well.  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  REVIEW OF SYSTEMS:  He is very hard of hearing.  He did have hearing aids at one time but does not wear them.  He denies any other systemic skin, eye, ENT, respiratory, cardiovascular, GI, GU, musculoskeletal or neurological complaints.  PHYSICAL EXAMINATION:  VITAL SIGNS:  Blood pressure 154/88, pulse 90 and regular.  Respirations 18, temperature 101.3.  O2 saturation 96% on room air. GENERAL:  The patient seems in severe pain rated 8 to 9/10 in intensity.  He is grimacing but he alert, oriented and no respiratory distress.  SKIN:  Warm and dry, no rash.  HEENT:  Pupils equal, round, and reactive to light.  Full EOMs, fundi benign. Discs flat.  Sclerae nonicteric. ENT:  TMs normal.  No intraoral lesions. Pharynx clear.  Mucous  membranes moist.  Teeth in full repair.  NECK: Supple, no adenopathy, JVD or bruits.  Thyroid normal.  LUNGS: Clear to auscultation and percussion.  BACK:  No CVA tenderness.  HEART: Regular rhythm, no gallop or murmur.  ABDOMEN:  There is marked right lower quadrant and right flank tenderness to palpation more so in the right lower quadrant than in the right flank with guarding.  There is no organomegaly or mass.  Bowel sounds are normally active.  RECTAL:  No masses, no tenderness, stool Hemoccult negative.  EXTREMITIES: No edema.  Pulses are full.  NEUROLOGIC:  Alert, oriented x 3.  speech is clear and appropriate.  No extremity weakness or tremor.  DTRs 2+ and symmetrical.  Babinski downgoing. Cranial nerves II-XII intact.  ADMITTING  IMPRESSION: 1. Right lower quadrant pain likely to be due to a flare up of Crohns disease but rule out kidney stones, pyelonephritis, appendicitis, bowel obstruction or an abscess. 2. Hypertension.  PLAN: 1. IV fluids. 2. IV morphine. 3. KUB and upright abdomen. 4. CBC, sed rate, abdominal CT scan for GI consult. 5. IV Cipro and Flagyl. Dictated by:   Reuben Likes, M.D. Attending Physician:  Roque Lias DD:  08/25/01 TD:  08/26/01 Job: [id number]

## 2010-12-23 NOTE — Discharge Summary (Signed)
[place]  Patient:    CODI, KERTZ Visit Number: [id number] MRN: [id number]          Service Type: MED Location: [phone] Attending Physician:  Delsa Bern Dictated by:   Lorne Skeens. Hoxworth, M.D. Admit Date:  08/25/2001 Discharge Date: 09/06/2001   CC:         John C. Madilyn Fireman, M.D.  Dr. Lorenz Coaster   Discharge Summary  DISCHARGE DIAGNOSES:  Crohns disease of the terminal ileum and right colon with perforation abscess.  OPERATIONS AND PROCEDURES:  Right hemicolectomy on August 29, 2001.  HISTORY OF PRESENT ILLNESS:  Mr. Baskett is a 55 year old black male who initially presented in June of 2002 with right lower quadrant phlegmon and abscess as an apparent initial presentation of Crohns disease. He underwent percutaneous abscess drainage and was treated with Entocort and Asacol with excellent response. He had been followed closely by Dr. Madilyn Fireman. He has been doing well until the last two to three weeks when he developed increasing right lower quadrant pain which became much worse just prior to admission on January 19. No nausea or vomiting. CT scan obtained just prior to admission revealed right lower quadrant phlegmon and abscess consistent with recurrent Crohns and perforation.  PAST MEDICAL HISTORY:  No previous surgery. Medical only as above and hypertension.  MEDICATIONS ON ADMISSION:  1. Entocort t.i.d.  2. Asacol 800 mg t.i.d.  3. Atenolol 50 mg daily.  4. Hydrochlorothiazide 25 mg daily.  ALLERGIES:  No known drug allergies.  Social history, family history and review of systems see ______.  PHYSICAL EXAMINATION:  VITAL SIGNS:  Temperature was 100 and vital signs all within normal limits.  GENERAL:  He was a well-nourished, well-developed, white male.  Pertinent findings were limited to the abdomen which was nondistended. There is significant tenderness in the right lower quadrant with  guarding.  LABORATORY DATA:  White count 10,000 hemoglobin 12.3, sodium 129, potassium 2.6. Urinalysis negative.  HOSPITAL COURSE:  The patient was admitted by Dr. Lorenz Coaster and was begun on broad spectrum antibiotics. With recurrent problems on therapy it was felt that surgery was the best course. The patient was maintained on antibiotics and underwent a gentle bowel prep and was taken to the operating room on August 29, 2001. Findings were a large retroperitoneal abscess and marked thickening of the right colon and last few centimeters of terminal ileum grossly consistent with Crohns disease. He underwent a right hemicolectomy and drainage of the abscess. He tolerated the procedure well. He was maintained on broad spectrum antibiotics. He had some fever postop which was 101 on the first postoperative day. White count was 16,000. A JP catheter had been drained and left in the abscess cavity and drained SEROSANGUINEOUS fluid. Culture revealed multiple enteric species. On the second postoperative day, he had a low grade temperature and JP drainage was decreased. A Foley catheter and NG tube were removed and he was left n.p.o. He had evidence of ileus over the next couple of days with some distention but no nausea or vomiting. He had a low grade fever. He continued on antibiotics. Cipro and Unasyn were used for coverage of the species from his culture. By January 26, he was having positive flatus, the ileus was felt to be resolving and he was begun on a liquid diet. His wound healed primarily. He continued gradual improvement. Final pathology was not completely diagnostic but was felt to be most consistent with Crohns disease with fistulization  and abscess. His diet was gradually advanced. He had some elevated blood pressures late in the hospital stay which were managed by Dr. Lorenz Coaster adding altace to his previous regimen. Diet was advanced, activity increased and he was felt ready for  discharge on January 31. His wound is healing primarily. The abdomen is benign. Staples removed.  DISCHARGE MEDICATIONS:  1. Atenolol 50 mg a day.  2. Hydrochlorothiazide 25 mg a day.  3. Altace 5 mg a day.  FOLLOW-UP:  In my office in one week and he will continue to follow-up with Dr. Madilyn Fireman and with Dr. Lorenz Coaster for longterm care. Dictated by:   Lorne Skeens. Hoxworth, M.D. Attending Physician:  Delsa Bern DD:  10/01/01 TD:  10/01/01 Job: [id number]

## 2010-12-23 NOTE — Op Note (Signed)
[facility]  Patient:    Rick Edwards, Rick Edwards Visit Number: [id number] MRN: [id number]          Service Type: MED Location: [phone] Attending Physician:  Roque Lias Dictated by:   Lorne Skeens. Hoxworth, M.D. Proc. Date: 08/28/01 Admit Date:  08/25/2001                             Operative Report  PREOPERATIVE DIAGNOSES: 1. Retroperitoneal abscess and possible Crohns disease. 2. Liver mass.  POSTOPERATIVE DIAGNOSES: 1. Retroperitoneal abscess and possible Crohns disease. 2. Liver mass.  PROCEDURE: 1. Right hemicolectomy with drainage of retroperitoneal abscess. 2. Edge biopsy of liver.  SURGEON:  Lorne Skeens. Hoxworth, M.D.  ASSISTANTAngelia Mould. Derrell Lolling, M.D.  ANESTHESIA:  General.  BRIEF HISTORY:  Rick Edwards is a 55 year old black male who initially presented about six months ago with chronic abdominal pain and weight loss and was found to have a phlegmon and abscess in the right lower quadrant of the abdomen. This clinically and radiographically appeared most consistent with Crohns disease. He was treated initially with percutaneous drainage and antibiotics and then with Asacol and Entocort and had essentially complete resolution of his GI symptoms. He now, however, presents with two weeks of gradually worsening abdominal pain and then acute right lower quadrant pain and CT has revealed marked thickening of the cecum and proximal right colon and a large retroperitoneal abscess in the iliopsoas beneath the right kidney. As he is already been on medical management for Crohns disease, we have elected to proceed with surgical intervention. This will likely require resection of terminal ileum and right colon and drainage of his abscess. In addition, on CT scan he has an indeterminate mass in the medial segment of the left lobe of the liver and we will plan to assess this intraoperatively. The indications for the  procedure and risk of bleeding and infection were discussed preoperatively with the patient and his family. He is now brought to the operating room for this procedure.  DESCRIPTION OF PROCEDURE: The patient was brought to the operating room and placed in the supine position on the operating table and general endotracheal anesthesia was induced. A Foley catheter and nasogastric tube were placed. His was already on broad spectrum antibiotics. The abdomen was sterilely prepped and draped. The abdomen was explored through a midline incision _______ the umbilicus. A thorough exploration was performed. In the liver there was a several centimeter lobular, rubbery mass palpable superficially in the medial aspect of the left lobe of the liver. The stomach, duodenum, and gallbladder were normal. The small bowel was normal all the way to the ileocecal valve. The cecum and right colon showed thickening and apparent chronic inflammatory change and were adherent to the pelvic sidewall and fairly fixed into the retroperitoneum. The changes extended up toward the hepatic flexure. The remainder of the colon was normal. There was an adenopathy in the mesentery of the proximal right colon. Initially, the terminal ileum and right colon were extensively mobilized. There were a lot of chronic scarring and inflammatory attachments and made this mobilization somewhat difficult, but it progressed without incident. The duodenum was identified and was protected. There was a lot of inflammatory change in the retroperitoneum and I stayed close to the colon and did not attempt to dissect the ureter. As the colon came out of the retroperitoneum, there was seen to be an area of  pinpoint purulent drainage just inferior to the kidney laterally over the iliopsoas. This was deepened with blunt dissection into this area and a large retroperitoneal abscess drained and quite a lot of thick, foul smelling pus was evacuated from a  large cavity. The loculations were broken up digitally and the cavity was thoroughly irrigated. Cultures were obtained of the drainage. Points of normal bowel at the terminal ileum and the proximal transverse colon were chosen. The omentum was divided in preparation for resection of the proximal transverse colon. The mesentery of the terminal ileum and right colon were then sequentially divided between clamps and tied with 2-0 silk ties staying close to the bowel. Large vessels were additionally suture ligated. With complete division of the mesentery, a functional end-to-end anastomosis was created between the terminal ileum and transverse colon with a single firing of the GIA 75 mm stapler. There was no bleeding from the staple line. The common _______ was closed and the specimen removed with a single firing of TA 60 stapler. The mesenteric defect was closed with interrupted 2-0 silk sutures. The anastomosis appeared widely patent, under no tension, and with good blood supply. The abdomen was copiously irrigated with normal saline and hemostasis was assured.  Attention was turned to the area of the liver which was easily exposed. A wedge biopsy into the mass was obtained with cautery and hemostasis obtained with cautery and a Surgicel pack. The abdomen was again inspected for hemostasis. A 19 Blake drain was brought up through a stab wound on the right lateral abdominal wall and placed down into the retroperitoneal abscess cavity. Viscera returned to the anatomic position. Midline fascia was closed with running #1 PDS beginning at either end and tied centrally. The subcutaneous tissue was irrigated with antibiotic solution and the skin closed with staples. The drain was sutured in place with nylon. Sponge, needle, and instrument counts were correct. A dry sterile dressing was applied and the patient was taken to the recovery in good condition. Dictated by:   Lorne Skeens. Hoxworth,  M.D. Attending Physician:  Roque Lias DD:  08/28/01 TD:  08/29/01 Job: [id number]

## 2011-01-03 ENCOUNTER — Other Ambulatory Visit: Payer: Self-pay | Admitting: Internal Medicine

## 2011-01-03 MED ORDER — SPIRONOLACTONE 25 MG PO TABS: ORAL_TABLET | ORAL | Status: DC

## 2011-01-10 ENCOUNTER — Other Ambulatory Visit: Payer: Self-pay | Admitting: Internal Medicine

## 2011-01-10 ENCOUNTER — Encounter (HOSPITAL_BASED_OUTPATIENT_CLINIC_OR_DEPARTMENT_OTHER): Payer: No Typology Code available for payment source | Admitting: Internal Medicine

## 2011-01-10 DIAGNOSIS — D473 Essential (hemorrhagic) thrombocythemia: Secondary | ICD-10-CM

## 2011-01-10 DIAGNOSIS — D509 Iron deficiency anemia, unspecified: Secondary | ICD-10-CM

## 2011-01-10 LAB — CBC WITH DIFFERENTIAL/PLATELET
BASO%: 0.3 % (ref 0.0–2.0)
Basophils Absolute: 0 10*3/uL (ref 0.0–0.1)
EOS%: 1.3 % (ref 0.0–7.0)
Eosinophils Absolute: 0.1 10*3/uL (ref 0.0–0.5)
HCT: 31.5 % — ABNORMAL LOW (ref 38.4–49.9)
HGB: 10.4 g/dL — ABNORMAL LOW (ref 13.0–17.1)
LYMPH%: 18.6 % (ref 14.0–49.0)
MCH: 28.8 pg (ref 27.2–33.4)
MCHC: 33.2 g/dL (ref 32.0–36.0)
MCV: 87 fL (ref 79.3–98.0)
MONO#: 0.7 10*3/uL (ref 0.1–0.9)
MONO%: 12.2 % (ref 0.0–14.0)
NEUT#: 3.8 10*3/uL (ref 1.5–6.5)
NEUT%: 67.6 % (ref 39.0–75.0)
Platelets: 639 10*3/uL — ABNORMAL HIGH (ref 140–400)
RBC: 3.62 10*6/uL — ABNORMAL LOW (ref 4.20–5.82)
RDW: 19.6 % — ABNORMAL HIGH (ref 11.0–14.6)
WBC: 5.7 10*3/uL (ref 4.0–10.3)
lymph#: 1.1 10*3/uL (ref 0.9–3.3)

## 2011-01-10 LAB — IRON AND TIBC: %SAT: 19 % — ABNORMAL LOW (ref 20–55)

## 2011-01-10 LAB — FERRITIN: Ferritin: 20 ng/mL — ABNORMAL LOW (ref 22–322)

## 2011-01-13 ENCOUNTER — Other Ambulatory Visit: Payer: Self-pay | Admitting: Internal Medicine

## 2011-01-13 MED ORDER — SPIRONOLACTONE 25 MG PO TABS: ORAL_TABLET | ORAL | Status: DC

## 2011-01-13 NOTE — Telephone Encounter (Signed)
meds sent in

## 2011-01-17 ENCOUNTER — Encounter (HOSPITAL_BASED_OUTPATIENT_CLINIC_OR_DEPARTMENT_OTHER): Payer: No Typology Code available for payment source | Admitting: Internal Medicine

## 2011-01-17 DIAGNOSIS — D473 Essential (hemorrhagic) thrombocythemia: Secondary | ICD-10-CM

## 2011-01-17 DIAGNOSIS — D649 Anemia, unspecified: Secondary | ICD-10-CM

## 2011-01-24 ENCOUNTER — Encounter (HOSPITAL_BASED_OUTPATIENT_CLINIC_OR_DEPARTMENT_OTHER): Payer: No Typology Code available for payment source | Admitting: Internal Medicine

## 2011-01-24 DIAGNOSIS — D509 Iron deficiency anemia, unspecified: Secondary | ICD-10-CM

## 2011-01-24 DIAGNOSIS — D473 Essential (hemorrhagic) thrombocythemia: Secondary | ICD-10-CM

## 2011-01-27 ENCOUNTER — Encounter: Payer: Self-pay | Admitting: Internal Medicine

## 2011-01-31 ENCOUNTER — Encounter: Payer: Self-pay | Admitting: Internal Medicine

## 2011-01-31 ENCOUNTER — Ambulatory Visit (INDEPENDENT_AMBULATORY_CARE_PROVIDER_SITE_OTHER): Payer: No Typology Code available for payment source | Admitting: Internal Medicine

## 2011-01-31 ENCOUNTER — Encounter (HOSPITAL_BASED_OUTPATIENT_CLINIC_OR_DEPARTMENT_OTHER): Payer: No Typology Code available for payment source | Admitting: Internal Medicine

## 2011-01-31 DIAGNOSIS — D473 Essential (hemorrhagic) thrombocythemia: Secondary | ICD-10-CM

## 2011-01-31 DIAGNOSIS — I10 Essential (primary) hypertension: Secondary | ICD-10-CM

## 2011-01-31 DIAGNOSIS — K5010 Crohn's disease of large intestine without complications: Secondary | ICD-10-CM

## 2011-01-31 DIAGNOSIS — D649 Anemia, unspecified: Secondary | ICD-10-CM

## 2011-01-31 MED ORDER — METOPROLOL TARTRATE 25 MG PO TABS: 25.0000 mg | ORAL_TABLET | Freq: Two times a day (BID) | ORAL | Status: DC

## 2011-01-31 MED ORDER — SPIRONOLACTONE 25 MG PO TABS: ORAL_TABLET | ORAL | Status: DC

## 2011-01-31 NOTE — Assessment & Plan Note (Signed)
ROS neg To see GI next month

## 2011-01-31 NOTE — Patient Instructions (Signed)
Take meds as prescribed Check the  blood pressure 2 or 3 times a week, be sure it is less than 140/85. If it is consistently higher, let me know NURSE VISIT IN 3 WEEK FOR A BP CHECK, NO CHARGE, PLEASE GET AN APPOINTMENT

## 2011-01-31 NOTE — Assessment & Plan Note (Addendum)
Anemia and thrombocytosis, just saw hematology last week

## 2011-01-31 NOTE — Assessment & Plan Note (Signed)
BP elevated, has not been taking BB: restart metoprolol, See instructions

## 2011-01-31 NOTE — Progress Notes (Signed)
  Subjective:    Patient ID: Rick Edwards, male    DOB: 28-Oct-1955, 55 y.o.   MRN: [id number]  HPI ROV, feels well, only meds he has been on are spironolactone and Imuran, not taking BB. BP last week @ hematology elevated   Past Medical History  Diagnosis Date  . Crohn's disease   . Perianal abscess 2001    s/p right hemicolectomy, and drainage of retroperitoneal abcess 2003  . Hypertension   . Hearing loss   . GI bleed 6/11    w/ normal EGD 6/11, 3 PRBCs   . Anemia     anemia thrombocytopenia, saw Hematology 2011, can not r/o myeloproliferative d/c    Past Surgical History  Procedure Date  . Hemicolectomy 08/29/2001    perforated abscess  . Anal fistulotomy 2002     Social History: not married no children lives by himself Alcohol use-yes (3 beers daily)---denies ETOH 6-11 Drug use-no Regular exercise-no works @ [employer]  Patient is a former smoker.   Review of Systems  Respiratory: Negative for shortness of breath.   Cardiovascular: Negative for chest pain.  Gastrointestinal: Negative for nausea, vomiting, abdominal pain, diarrhea and anal bleeding.       Objective:   Physical Exam  Constitutional: He appears well-developed and well-nourished.  HENT:  Head: Normocephalic and atraumatic.  Cardiovascular: Normal rate, regular rhythm and normal heart sounds.   No murmur heard. Pulmonary/Chest: Effort normal and breath sounds normal. No respiratory distress. He has no wheezes. He has no rales.  Musculoskeletal: He exhibits no edema.          Assessment & Plan:

## 2011-02-01 LAB — BASIC METABOLIC PANEL
GFR: 162.6 mL/min (ref >60.00)
Potassium: 4.1 mEq/L (ref 3.5–5.1)
Sodium: 141 mEq/L (ref 135–145)

## 2011-02-02 ENCOUNTER — Telehealth: Payer: Self-pay | Admitting: *Deleted

## 2011-02-02 NOTE — Telephone Encounter (Signed)
Message left for patient to return my call.  

## 2011-02-02 NOTE — Telephone Encounter (Signed)
Message copied by Leanne Lovely on Thu Feb 02, 2011 10:50 AM ------      Message from: [name] E      Created: Thu Feb 02, 2011  8:09 AM       Advise patient:      Labs ok

## 2011-02-03 NOTE — Telephone Encounter (Signed)
Pt's father is aware.

## 2011-04-17 ENCOUNTER — Other Ambulatory Visit: Payer: Self-pay | Admitting: Internal Medicine

## 2011-04-19 ENCOUNTER — Encounter (HOSPITAL_BASED_OUTPATIENT_CLINIC_OR_DEPARTMENT_OTHER): Payer: No Typology Code available for payment source | Admitting: Internal Medicine

## 2011-04-19 ENCOUNTER — Other Ambulatory Visit: Payer: Self-pay | Admitting: Internal Medicine

## 2011-04-19 DIAGNOSIS — D473 Essential (hemorrhagic) thrombocythemia: Secondary | ICD-10-CM

## 2011-04-19 DIAGNOSIS — D649 Anemia, unspecified: Secondary | ICD-10-CM

## 2011-04-19 DIAGNOSIS — D509 Iron deficiency anemia, unspecified: Secondary | ICD-10-CM

## 2011-04-19 LAB — CBC WITH DIFFERENTIAL/PLATELET
BASO%: 0.5 % (ref 0.0–2.0)
Basophils Absolute: 0 10*3/uL (ref 0.0–0.1)
HCT: 36.7 % — ABNORMAL LOW (ref 38.4–49.9)
HGB: 12.3 g/dL — ABNORMAL LOW (ref 13.0–17.1)
MCHC: 33.5 g/dL (ref 32.0–36.0)
MONO#: 0.7 10*3/uL (ref 0.1–0.9)
NEUT%: 65.6 % (ref 39.0–75.0)
RDW: 17.1 % — ABNORMAL HIGH (ref 11.0–14.6)
WBC: 5.2 10*3/uL (ref 4.0–10.3)
lymph#: 1 10*3/uL (ref 0.9–3.3)

## 2011-04-19 LAB — IRON AND TIBC
%SAT: 19 % — ABNORMAL LOW (ref 20–55)
TIBC: 272 ug/dL (ref 215–435)

## 2011-04-19 MED ORDER — SPIRONOLACTONE 25 MG PO TABS: ORAL_TABLET | ORAL | Status: DC

## 2011-04-19 NOTE — Telephone Encounter (Signed)
Done

## 2011-05-29 ENCOUNTER — Ambulatory Visit (INDEPENDENT_AMBULATORY_CARE_PROVIDER_SITE_OTHER): Payer: No Typology Code available for payment source | Admitting: Internal Medicine

## 2011-05-29 ENCOUNTER — Encounter: Payer: Self-pay | Admitting: Internal Medicine

## 2011-05-29 VITALS — BP 158/82 | HR 83 | Temp 98.3°F | Resp 18 | Ht 69.0 in | Wt 163.6 lb

## 2011-05-29 DIAGNOSIS — I10 Essential (primary) hypertension: Secondary | ICD-10-CM

## 2011-05-29 DIAGNOSIS — D649 Anemia, unspecified: Secondary | ICD-10-CM

## 2011-05-29 DIAGNOSIS — Z23 Encounter for immunization: Secondary | ICD-10-CM

## 2011-05-29 MED ORDER — AMLODIPINE BESYLATE 5 MG PO TABS: 5.0000 mg | ORAL_TABLET | Freq: Every day | ORAL | Status: DC

## 2011-05-29 NOTE — Assessment & Plan Note (Signed)
Improving per last hematology note

## 2011-05-29 NOTE — Assessment & Plan Note (Signed)
Intolerant to metoprolol, trial w/ amlodipine, no interactions w/ imuran noted

## 2011-05-29 NOTE — Progress Notes (Addendum)
  Subjective:    Patient ID: Rick Edwards, male    DOB: Apr 25, 1956, 55 y.o.   MRN: [id number]  HPI ROV Self d/c metoprolol, "did not feel well", weak? Had a cold last week but feels better except from some sinus congestion  Past Medical History  Diagnosis Date  . Crohn's disease   . Perianal abscess 2001    s/p right hemicolectomy, and drainage of retroperitoneal abcess 2003  . Hypertension   . Hearing loss   . GI bleed 6/11    w/ normal EGD 6/11, 3 PRBCs   . Anemia     anemia thrombocytopenia, saw Hematology 2011, can not r/o myeloproliferative d/c   Past Surgical History  Procedure Date  . Hemicolectomy 08/29/2001    perforated abscess  . Anal fistulotomy 2002     Review of Systems No fever, chills  Cough resolved No N-V-D, no blood in the stools No sputum proiduction Anemia;; saw hematology, improved per note      Objective:   Physical Exam  Constitutional: He appears well-developed and well-nourished. No distress.  HENT:       Nose with very mild congestion, oropharynx without redness or discharge  Cardiovascular: Normal rate, regular rhythm and normal heart sounds.   Pulmonary/Chest: Effort normal. No respiratory distress. He has no wheezes. He has no rales.  Musculoskeletal: He exhibits no edema.  Skin: He is not diaphoretic.          Assessment & Plan:  Flu shot today A number of old records are reviewed, most relevant ones will be scan  The rest will return to the patient for safekeeping: Had a colonoscopy 01/25/2005 (quality  of the fax is too poor to be scanned)  , 02/16/2005, 02/16/2006 by Dr Elnoria Howard ,

## 2011-05-29 NOTE — Patient Instructions (Signed)
Start amlodipine, check the  blood pressure 2 or 3 times a week, be sure it is between 140/85 and 110/60 . Call me if problems

## 2011-05-30 NOTE — Progress Notes (Deleted)
  Subjective:    Patient ID: Rick Edwards, male    DOB: 10-May-1956, 55 y.o.   MRN: [id number]  HPI    Review of Systems     Objective:   Physical Exam        Assessment & Plan:

## 2011-07-14 ENCOUNTER — Other Ambulatory Visit: Payer: No Typology Code available for payment source | Admitting: Lab

## 2011-07-18 ENCOUNTER — Other Ambulatory Visit: Payer: Self-pay | Admitting: Internal Medicine

## 2011-07-18 DIAGNOSIS — D509 Iron deficiency anemia, unspecified: Secondary | ICD-10-CM

## 2011-07-19 ENCOUNTER — Ambulatory Visit (HOSPITAL_BASED_OUTPATIENT_CLINIC_OR_DEPARTMENT_OTHER): Payer: No Typology Code available for payment source | Admitting: Internal Medicine

## 2011-07-19 ENCOUNTER — Ambulatory Visit (HOSPITAL_BASED_OUTPATIENT_CLINIC_OR_DEPARTMENT_OTHER): Payer: No Typology Code available for payment source

## 2011-07-19 ENCOUNTER — Other Ambulatory Visit: Payer: Self-pay | Admitting: Internal Medicine

## 2011-07-19 ENCOUNTER — Telehealth: Payer: Self-pay | Admitting: Internal Medicine

## 2011-07-19 VITALS — BP 154/90 | HR 73 | Temp 98.8°F | Ht 69.0 in | Wt 164.5 lb

## 2011-07-19 DIAGNOSIS — D649 Anemia, unspecified: Secondary | ICD-10-CM

## 2011-07-19 DIAGNOSIS — R5381 Other malaise: Secondary | ICD-10-CM

## 2011-07-19 DIAGNOSIS — D509 Iron deficiency anemia, unspecified: Secondary | ICD-10-CM

## 2011-07-19 DIAGNOSIS — D759 Disease of blood and blood-forming organs, unspecified: Secondary | ICD-10-CM

## 2011-07-19 DIAGNOSIS — D473 Essential (hemorrhagic) thrombocythemia: Secondary | ICD-10-CM

## 2011-07-19 LAB — CBC WITH DIFFERENTIAL/PLATELET
BASO%: 0.8 % (ref 0.0–2.0)
Basophils Absolute: 0 10*3/uL (ref 0.0–0.1)
EOS%: 1.3 % (ref 0.0–7.0)
HCT: 36.4 % — ABNORMAL LOW (ref 38.4–49.9)
HGB: 12.1 g/dL — ABNORMAL LOW (ref 13.0–17.1)
MCH: 32.5 pg (ref 27.2–33.4)
MCHC: 33.2 g/dL (ref 32.0–36.0)
MONO#: 0.5 10*3/uL (ref 0.1–0.9)
NEUT%: 68.7 % (ref 39.0–75.0)
RDW: 15.4 % — ABNORMAL HIGH (ref 11.0–14.6)
WBC: 5.8 10*3/uL (ref 4.0–10.3)
lymph#: 1.2 10*3/uL (ref 0.9–3.3)

## 2011-07-19 LAB — IRON AND TIBC: UIBC: 281 ug/dL (ref 125–400)

## 2011-07-19 NOTE — Telephone Encounter (Signed)
Talked to pt, gave him appt for December and March

## 2011-07-19 NOTE — Progress Notes (Signed)
[facility] OFFICE PROGRESS NOTE  [name], MD, MD [phone] [facility] [address] [place] [address]  DIAGNOSIS: Iron deficiency anemia with reactive thrombocytosis.  PRIOR THERAPY: Status post intravenous Feraheme infusion.  Last dose was given in June 2012.  CURRENT THERAPY: Observation.  INTERVAL HISTORY: Rick Edwards 55 y.o. male returns to the clinic today for routine three-month followup visit. The patient is feeling fine today no specific complaints except for occasional fatigue. He denied having any significant weight loss or night sweats. He has no chest pain or shortness breath. The patient has repeat CBC, and iron study performed today and he is here for evaluation and discussion of his lab results.  MEDICAL HISTORY: Past Medical History  Diagnosis Date  . Crohn's disease   . Perianal abscess 2001    s/p right hemicolectomy, and drainage of retroperitoneal abcess 2003  . Hypertension   . Hearing loss   . GI bleed 6/11    w/ normal EGD 6/11, 3 PRBCs   . Anemia     anemia thrombocytopenia, saw Hematology 2011, can not r/o myeloproliferative d/c    ALLERGIES:  is allergic to mesalamine.  MEDICATIONS:  Current Outpatient Prescriptions  Medication Sig Dispense Refill  . amLODipine (NORVASC) 5 MG tablet Take 1 tablet (5 mg total) by mouth daily.  30 tablet  6  . azaTHIOprine (IMURAN) 50 MG tablet Take 25 mg by mouth daily.        Marland Kitchen spironolactone (ALDACTONE) 25 MG tablet TAKE 1 TABLET BY MOUTH EVERY DAY  60 tablet  0    SURGICAL HISTORY:  Past Surgical History  Procedure Date  . Hemicolectomy 08/29/2001    perforated abscess  . Anal fistulotomy 2002    REVIEW OF SYSTEMS:  A comprehensive review of systems was negative except for: Constitutional: positive for fatigue   PHYSICAL EXAMINATION: General appearance: alert, cooperative and no distress Head: Normocephalic, without obvious abnormality, atraumatic Neck: no adenopathy Lymph  nodes: Cervical, supraclavicular, and axillary nodes normal. Resp: clear to auscultation bilaterally Cardio: regular rate and rhythm, S1, S2 normal, no murmur, click, rub or gallop GI: soft, non-tender; bowel sounds normal; no masses,  no organomegaly Extremities: extremities normal, atraumatic, no cyanosis or edema Neurologic: Alert and oriented X 3, normal strength and tone. Normal symmetric reflexes. Normal coordination and gait  ECOG PERFORMANCE STATUS: 1 - Symptomatic but completely ambulatory  Blood pressure 154/90, pulse 73, temperature 98.8 F (37.1 C), temperature source Oral, height 5\' 9"  (1.753 m), weight 164 lb 8 oz (74.617 kg).  LABORATORY DATA: Lab Results  Component Value Date   WBC 5.8 07/19/2011   HGB 12.1* 07/19/2011   HCT 36.4* 07/19/2011   MCV 97.9 07/19/2011   PLT 590* 07/19/2011      Chemistry      Component Value Date/Time   NA 141 01/31/2011 0850   K 4.1 01/31/2011 0850   CL 100 01/31/2011 0850   CO2 30 01/31/2011 0850   BUN 8 01/31/2011 0850   CREATININE 0.7 01/31/2011 0850      Component Value Date/Time   CALCIUM 8.5 01/31/2011 0850   ALKPHOS 74 05/10/2010 0415   AST 10 05/10/2010 0415   ALT 10 05/10/2010 0415   BILITOT 0.6 05/10/2010 0415      ASSESSMENT: Ms. a very pleasant 55 years old African American male with a history of high deficiency anemia as well as reactive thrombocytosis. The patient is doing fine today with no significant complaints  except for occasional fatigue. There is no significant change in his hemoglobin and hematocrit but the patient has decreased ferritin level.   PLAN: I would consider him for Feraheme infusion in the next 1-2 weeks. I would see him back for followup visit in 3 months with repeat CBC and iron study.  All questions were answered. The patient knows to call the clinic with any problems, questions or concerns. We can certainly see the patient much sooner if necessary.

## 2011-07-25 ENCOUNTER — Ambulatory Visit: Payer: No Typology Code available for payment source

## 2011-07-25 ENCOUNTER — Ambulatory Visit (HOSPITAL_BASED_OUTPATIENT_CLINIC_OR_DEPARTMENT_OTHER): Payer: No Typology Code available for payment source

## 2011-07-25 DIAGNOSIS — D759 Disease of blood and blood-forming organs, unspecified: Secondary | ICD-10-CM

## 2011-07-25 DIAGNOSIS — D649 Anemia, unspecified: Secondary | ICD-10-CM

## 2011-07-25 MED ORDER — FERUMOXYTOL INJECTION 510 MG/17 ML
510.0000 mg | Freq: Once | INTRAVENOUS | Status: AC
  Administered 2011-07-25: 510 mg via INTRAVENOUS
  Filled 2011-07-25: qty 17

## 2011-07-25 MED ORDER — SODIUM CHLORIDE 0.9 % IV SOLN
Freq: Once | INTRAVENOUS | Status: AC
  Administered 2011-07-25: 09:00:00 via INTRAVENOUS

## 2011-08-04 ENCOUNTER — Ambulatory Visit: Payer: No Typology Code available for payment source

## 2011-08-29 ENCOUNTER — Ambulatory Visit (INDEPENDENT_AMBULATORY_CARE_PROVIDER_SITE_OTHER): Payer: No Typology Code available for payment source | Admitting: Internal Medicine

## 2011-08-29 ENCOUNTER — Encounter: Payer: Self-pay | Admitting: Internal Medicine

## 2011-08-29 DIAGNOSIS — F528 Other sexual dysfunction not due to a substance or known physiological condition: Secondary | ICD-10-CM

## 2011-08-29 DIAGNOSIS — Z0000 Encounter for general adult medical examination without abnormal findings: Secondary | ICD-10-CM | POA: Insufficient documentation

## 2011-08-29 DIAGNOSIS — I10 Essential (primary) hypertension: Secondary | ICD-10-CM

## 2011-08-29 MED ORDER — SILDENAFIL CITRATE 100 MG PO TABS: 50.0000 mg | ORAL_TABLET | Freq: Every day | ORAL | Status: DC | PRN

## 2011-08-29 NOTE — Assessment & Plan Note (Signed)
Needs a viagra RF

## 2011-08-29 NOTE — Assessment & Plan Note (Signed)
BP elevated a little today, good med compliance ; reports good amb BPs No change, see instructions

## 2011-08-29 NOTE — Patient Instructions (Addendum)
Came back fasting for labs only:  FLP BMP TSH PSA -------------------dx v70 Contact your pharmacy when you need a refill Check the  blood pressure 2 or 3 times a week, be sure it is less than 140/85. If it is consistently higher, let me know

## 2011-08-29 NOTE — Progress Notes (Signed)
  Subjective:    Patient ID: Rick Edwards, male    DOB: 1955-08-08, 56 y.o.   MRN: [id number]  HPI CPX   Past Medical History: HTN Hearing loss , B, has aL hearing aid CROHNS DISEASE -- see surgeries  upper GI bleed with normal EGD  6-11, 3 PRBCs  Past Surgical History: rt hemicolectomy 08/29/2001 (perforated abscess) ANAL FISTULOTOMY, ABSCESS 2002  Family History: CAD - no DM - no HTN -- "the whole family" stroke - GF, aunts colon ca - no prostate Ca - no  Social History: not married, no children lives by himself Alcohol -- denies Tobacco-- rarely  Drug use-no Regular exercise-no, active at work works @ post [employer]     Review of Systems  Respiratory: Negative for cough and shortness of breath.   Cardiovascular: Negative for chest pain and leg swelling.  Gastrointestinal: Negative for nausea, abdominal pain, diarrhea and blood in stool.  Genitourinary: Negative for dysuria, hematuria and difficulty urinating.  Psychiatric/Behavioral:       No depression or anxiety        Objective:   Physical Exam  Constitutional: He is oriented to person, place, and time. He appears well-developed and well-nourished. No distress.  HENT:  Head: Normocephalic and atraumatic.  Neck: No thyromegaly present.  Cardiovascular: Normal rate, regular rhythm and normal heart sounds.   No murmur heard. Pulmonary/Chest: Effort normal and breath sounds normal. No respiratory distress. He has no wheezes. He has no rales.  Abdominal: Soft. Bowel sounds are normal. He exhibits no distension. There is no tenderness. There is no rebound and no guarding.  Genitourinary: Prostate normal.  Musculoskeletal: He exhibits no edema.  Neurological: He is alert and oriented to person, place, and time.  Skin: He is not diaphoretic.  Psychiatric: He has a normal mood and affect. His behavior is normal. Judgment and thought content normal.       Assessment & Plan:

## 2011-08-29 NOTE — Assessment & Plan Note (Addendum)
Td 2010 Had a flu shot   Labs, see instructions Cscopes--per gi Diet-exercise discussed

## 2011-09-08 ENCOUNTER — Other Ambulatory Visit (INDEPENDENT_AMBULATORY_CARE_PROVIDER_SITE_OTHER): Payer: No Typology Code available for payment source

## 2011-09-08 DIAGNOSIS — Z0000 Encounter for general adult medical examination without abnormal findings: Secondary | ICD-10-CM

## 2011-09-08 LAB — LIPID PANEL
Cholesterol: 200 mg/dL (ref 0–200)
HDL: 42.9 mg/dL (ref >39.00)
LDL Cholesterol: 137 mg/dL — ABNORMAL HIGH (ref 0–99)
Total CHOL/HDL Ratio: 5
Triglycerides: 100 mg/dL (ref 0.0–149.0)
VLDL: 20 mg/dL (ref 0.0–40.0)

## 2011-09-08 LAB — BASIC METABOLIC PANEL
BUN: 12 mg/dL (ref 6–23)
Calcium: 8.4 mg/dL (ref 8.4–10.5)
Chloride: 102 mEq/L (ref 96–112)
Creatinine, Ser: 0.7 mg/dL (ref 0.4–1.5)
GFR: 162.23 mL/min (ref >60.00)

## 2011-09-13 ENCOUNTER — Encounter: Payer: Self-pay | Admitting: *Deleted

## 2011-09-21 ENCOUNTER — Telehealth: Payer: Self-pay | Admitting: Internal Medicine

## 2011-09-21 MED ORDER — AMLODIPINE BESYLATE 5 MG PO TABS: 5.0000 mg | ORAL_TABLET | Freq: Every day | ORAL | Status: DC

## 2011-09-21 NOTE — Telephone Encounter (Signed)
Refill: Amlodipine Besylate 5 mg tab. Requesting 90 day supply.

## 2011-09-21 NOTE — Telephone Encounter (Signed)
Refill done.  

## 2011-10-17 ENCOUNTER — Ambulatory Visit (HOSPITAL_BASED_OUTPATIENT_CLINIC_OR_DEPARTMENT_OTHER): Payer: No Typology Code available for payment source | Admitting: Internal Medicine

## 2011-10-17 ENCOUNTER — Other Ambulatory Visit (HOSPITAL_BASED_OUTPATIENT_CLINIC_OR_DEPARTMENT_OTHER): Payer: No Typology Code available for payment source | Admitting: Lab

## 2011-10-17 ENCOUNTER — Telehealth: Payer: Self-pay | Admitting: Internal Medicine

## 2011-10-17 VITALS — BP 147/91 | HR 78 | Temp 98.3°F | Ht 69.5 in | Wt 162.4 lb

## 2011-10-17 DIAGNOSIS — D759 Disease of blood and blood-forming organs, unspecified: Secondary | ICD-10-CM

## 2011-10-17 DIAGNOSIS — D509 Iron deficiency anemia, unspecified: Secondary | ICD-10-CM

## 2011-10-17 DIAGNOSIS — D649 Anemia, unspecified: Secondary | ICD-10-CM

## 2011-10-17 LAB — CBC WITH DIFFERENTIAL/PLATELET
Basophils Absolute: 0 10*3/uL (ref 0.0–0.1)
EOS%: 1.8 % (ref 0.0–7.0)
Eosinophils Absolute: 0.1 10*3/uL (ref 0.0–0.5)
HCT: 36.8 % — ABNORMAL LOW (ref 38.4–49.9)
HGB: 12.2 g/dL — ABNORMAL LOW (ref 13.0–17.1)
MCH: 32.2 pg (ref 27.2–33.4)
MCV: 97.2 fL (ref 79.3–98.0)
MONO%: 15.2 % — ABNORMAL HIGH (ref 0.0–14.0)
NEUT#: 2.8 10*3/uL (ref 1.5–6.5)
NEUT%: 62.2 % (ref 39.0–75.0)

## 2011-10-17 LAB — IRON AND TIBC: TIBC: 267 ug/dL (ref 215–435)

## 2011-10-17 LAB — FERRITIN: Ferritin: 16 ng/mL — ABNORMAL LOW (ref 22–322)

## 2011-10-17 NOTE — Progress Notes (Signed)
[facility] Telephone:([phone]) [phone]   [phone]  OFFICE PROGRESS NOTE  [name], MD, MD [phone] [facility] [address] [place] [address]  PRINCIPAL DIAGNOSIS:  Iron deficiency anemia with reactive thrombocytosis.  PRIOR THERAPY:  The patient is status post intravenous Feraheme infusion.  Last dose was given in June 2012.  CURRENT THERAPY:  Observation.  INTERVAL HISTORY: Rick Edwards 56 y.o. male returns to the clinic today for 3 months followup visit. The patient has no complaints today and he is feeling fine. He continues to have mild fatigue but no dizzy spells. He has no significant weight loss or night sweats. No chest pain or shortness of breath. He has repeat CBC and iron study performed earlier today he is here for evaluation and discussion of his lab results.  MEDICAL HISTORY: Past Medical History  Diagnosis Date  . Crohn's disease   . Perianal abscess 2001    s/p right hemicolectomy, and drainage of retroperitoneal abcess 2003  . Hypertension   . Hearing loss   . GI bleed 6/11    w/ normal EGD 6/11, 3 PRBCs   . Anemia     anemia thrombocytopenia, saw Hematology 2011, can not r/o myeloproliferative d/c    ALLERGIES:  is allergic to mesalamine.  MEDICATIONS:  Current Outpatient Prescriptions  Medication Sig Dispense Refill  . amLODipine (NORVASC) 5 MG tablet Take 1 tablet (5 mg total) by mouth daily.  90 tablet  3  . azaTHIOprine (IMURAN) 50 MG tablet Take 25 mg by mouth daily.        Marland Kitchen spironolactone (ALDACTONE) 25 MG tablet TAKE 1 TABLET BY MOUTH EVERY DAY  60 tablet  0    SURGICAL HISTORY:  Past Surgical History  Procedure Date  . Hemicolectomy 08/29/2001    perforated abscess  . Anal fistulotomy 2002    REVIEW OF SYSTEMS:  A comprehensive review of systems was negative except for: Constitutional: positive for fatigue   PHYSICAL EXAMINATION: General appearance: alert, cooperative and no distress Lymph  nodes: Cervical, supraclavicular, and axillary nodes normal. Resp: clear to auscultation bilaterally Cardio: regular rate and rhythm, S1, S2 normal, no murmur, click, rub or gallop GI: soft, non-tender; bowel sounds normal; no masses,  no organomegaly Extremities: extremities normal, atraumatic, no cyanosis or edema Neurologic: Alert and oriented X 3, normal strength and tone. Normal symmetric reflexes. Normal coordination and gait  ECOG PERFORMANCE STATUS: 1 - Symptomatic but completely ambulatory  Blood pressure 147/91, pulse 78, temperature 98.3 F (36.8 C), temperature source Oral, height 5' 9.5" (1.765 m), weight 162 lb 6.4 oz (73.664 kg).  LABORATORY DATA: Lab Results  Component Value Date   WBC 4.5 10/17/2011   HGB 12.2* 10/17/2011   HCT 36.8* 10/17/2011   MCV 97.2 10/17/2011   PLT 523* 10/17/2011      Chemistry      Component Value Date/Time   NA 137 09/08/2011 1307   K 3.8 09/08/2011 1307   CL 102 09/08/2011 1307   CO2 28 09/08/2011 1307   BUN 12 09/08/2011 1307   CREATININE 0.7 09/08/2011 1307      Component Value Date/Time   CALCIUM 8.4 09/08/2011 1307   ALKPHOS 74 05/10/2010 0415   AST 10 05/10/2010 0415   ALT 10 05/10/2010 0415   BILITOT 0.6 05/10/2010 0415       RADIOGRAPHIC STUDIES: No results found.  ASSESSMENT: This is a very pleasant 56 years old [place] American male  with iron deficiency anemia status post Feraheme infusion and currently on observation.  PLAN: His hemoglobin and hematocrit are stable today. The iron studies still pending. I recommended for him continuous observation for now with repeat CBC and iron study in 6 months. The patient was advised to call me immediately if he has any concerning symptoms in the interval.  All questions were answered. The patient knows to call the clinic with any problems, questions or concerns. We can certainly see the patient much sooner if necessary.

## 2011-10-17 NOTE — Telephone Encounter (Signed)
appts made and printed for 04/2012  

## 2011-10-17 NOTE — Progress Notes (Signed)
Quick Note:  Call patient with the result and he needs Feraheme infusion in the next 1-2 weeks ______

## 2011-10-18 ENCOUNTER — Other Ambulatory Visit: Payer: Self-pay | Admitting: Medical Oncology

## 2011-10-18 ENCOUNTER — Telehealth: Payer: Self-pay | Admitting: Medical Oncology

## 2011-10-18 NOTE — Telephone Encounter (Signed)
Left message on pts cell voice mail to return call to schedule iron infusion. Schedule request sent

## 2011-10-18 NOTE — Telephone Encounter (Signed)
Message copied by Charma Igo on Wed Oct 18, 2011  6:09 PM ------      Message from: Si Gaul      Created: Tue Oct 17, 2011  5:17 PM       Call patient with the result and he needs Feraheme infusion in the next 1-2 weeks

## 2011-10-19 ENCOUNTER — Telehealth: Payer: Self-pay | Admitting: Internal Medicine

## 2011-10-19 NOTE — Telephone Encounter (Signed)
pt called for iv iron appt,made for 3/19 and pt is aware aom

## 2011-10-24 ENCOUNTER — Ambulatory Visit (HOSPITAL_BASED_OUTPATIENT_CLINIC_OR_DEPARTMENT_OTHER): Payer: No Typology Code available for payment source

## 2011-10-24 VITALS — BP 156/92 | HR 68 | Temp 98.8°F

## 2011-10-24 DIAGNOSIS — D649 Anemia, unspecified: Secondary | ICD-10-CM

## 2011-10-24 DIAGNOSIS — D759 Disease of blood and blood-forming organs, unspecified: Secondary | ICD-10-CM

## 2011-10-24 MED ORDER — FERUMOXYTOL INJECTION 510 MG/17 ML
510.0000 mg | Freq: Once | INTRAVENOUS | Status: AC
  Administered 2011-10-24: 510 mg via INTRAVENOUS
  Filled 2011-10-24: qty 17

## 2011-10-24 MED ORDER — SODIUM CHLORIDE 0.9 % IV SOLN
Freq: Once | INTRAVENOUS | Status: AC
  Administered 2011-10-24: 09:00:00 via INTRAVENOUS

## 2011-10-24 NOTE — Patient Instructions (Signed)
Pt discharged ambulatory. Pt to call with concerns 

## 2011-11-15 ENCOUNTER — Other Ambulatory Visit: Payer: Self-pay | Admitting: Gastroenterology

## 2011-11-15 DIAGNOSIS — K6389 Other specified diseases of intestine: Secondary | ICD-10-CM

## 2011-11-17 ENCOUNTER — Ambulatory Visit
Admission: RE | Admit: 2011-11-17 | Discharge: 2011-11-17 | Disposition: A | Discharge status: Home / Self Care | Payer: No Typology Code available for payment source | Source: Ambulatory Visit | Attending: Gastroenterology | Admitting: Gastroenterology

## 2011-11-17 DIAGNOSIS — K6389 Other specified diseases of intestine: Secondary | ICD-10-CM

## 2011-11-17 MED ORDER — IOHEXOL 300 MG/ML  SOLN
100.0000 mL | Freq: Once | INTRAMUSCULAR | Status: AC | PRN
  Administered 2011-11-17: 100 mL via INTRAVENOUS

## 2011-12-04 ENCOUNTER — Telehealth: Payer: Self-pay | Admitting: *Deleted

## 2011-12-04 NOTE — Telephone Encounter (Signed)
Error

## 2012-01-04 ENCOUNTER — Other Ambulatory Visit: Payer: Self-pay | Admitting: Internal Medicine

## 2012-01-04 NOTE — Telephone Encounter (Signed)
Refill done.  

## 2012-01-30 ENCOUNTER — Other Ambulatory Visit: Payer: Self-pay | Admitting: Gastroenterology

## 2012-01-30 ENCOUNTER — Ambulatory Visit
Admission: RE | Admit: 2012-01-30 | Discharge: 2012-01-30 | Disposition: A | Discharge status: Home / Self Care | Payer: No Typology Code available for payment source | Source: Ambulatory Visit | Attending: Gastroenterology | Admitting: Gastroenterology

## 2012-01-30 DIAGNOSIS — R140 Abdominal distension (gaseous): Secondary | ICD-10-CM

## 2012-02-06 ENCOUNTER — Ambulatory Visit (INDEPENDENT_AMBULATORY_CARE_PROVIDER_SITE_OTHER): Payer: No Typology Code available for payment source | Admitting: General Surgery

## 2012-02-06 ENCOUNTER — Encounter (INDEPENDENT_AMBULATORY_CARE_PROVIDER_SITE_OTHER): Payer: Self-pay | Admitting: General Surgery

## 2012-02-06 VITALS — BP 138/84 | HR 89 | Temp 99.2°F | Ht 69.0 in | Wt 142.2 lb

## 2012-02-06 DIAGNOSIS — K5010 Crohn's disease of large intestine without complications: Secondary | ICD-10-CM

## 2012-02-06 NOTE — Progress Notes (Signed)
Subjective:   Abdominal pain, weight loss, Crohn's disease  Patient ID: Rick Edwards, male   DOB: 02-22-1956, 56 y.o.   MRN: [id number]  HPI Patient is a 56 year old male with a history of Crohn's colitis referred by Dr. Madilyn Fireman. I know the patient from 10 years ago when he presented with severe Crohn's colitis involving the right colon with perforation into the retroperitoneum. At that time he underwent right hemicolectomy. He did well from this procedure and was symptom free for a number of years. On recent followup over about the past 2 years he has had progressive and persistent findings on colonoscopy and CT scan. Colonoscopy in 2010 by Dr. Arlyce Dice showed apparent active Crohn's colitis involving the descending and transverse colon with apparent sparing of the distal colon. This also showed a large pseudopolyp-like mass or possible inspissated stool ball in the transverse colon near the anastomosis. This was confirmed by CT scan. This all started about 2 years ago. He has been followed regularly. He was asymptomatic until recent weeks. For about 4-6 weeks he has had progressive abdominal pain. He describes pressure and cramping like pain across his upper abdomen. This is often worse with eating. He will have marked visual swelling of his right upper quadrant at times associated with the pain. He has chronic diarrhea. He has not had nausea or vomiting. He however has about 20 pound weight loss over the last month due to lack of appetite. Dr. Madilyn Fireman has followed her CEA levels which has slowly progressively increased from 62 years ago to 7 one year ago to 10 in April of this year to 11.5 this month. CT was performed about 2 weeks ago which are reviewed. This shows a markedly abnormal dilated transverse colon with a large amount of stool throughout the transverse and proximal left colon. There is a calcified large apparent fecalith in the proximal transverse colon. The small bowel appears normal. There is  concern for stricture of the left colon based on his CT scan. Past Medical History  Diagnosis Date  . Crohn's disease   . Perianal abscess 2001    s/p right hemicolectomy, and drainage of retroperitoneal abcess 2003  . Hypertension   . Hearing loss   . GI bleed 6/11    w/ normal EGD 6/11, 3 PRBCs   . Anemia     anemia thrombocytopenia, saw Hematology 2011, can not r/o myeloproliferative d/c   Past Surgical History  Procedure Date  . Hemicolectomy 08/29/2001    perforated abscess  . Anal fistulotomy 2002   Current Outpatient Prescriptions  Medication Sig Dispense Refill  . amLODipine (NORVASC) 5 MG tablet Take 1 tablet (5 mg total) by mouth daily.  90 tablet  3  . azaTHIOprine (IMURAN) 50 MG tablet Take 25 mg by mouth daily.        Marland Kitchen spironolactone (ALDACTONE) 25 MG tablet TAKE 1 TABLET BY MOUTH EVERY DAY  60 tablet  3   Allergies  Allergen Reactions  . Mesalamine     REACTION: Rash     Review of Systems  Constitutional: Positive for fatigue and unexpected weight change. Negative for fever and chills.  HENT: Positive for hearing loss.   Respiratory: Negative for cough, choking and shortness of breath.   Cardiovascular: Negative for chest pain and palpitations.  Gastrointestinal: Positive for abdominal pain, diarrhea and abdominal distention. Negative for nausea, vomiting, constipation and blood in stool.  Genitourinary: Negative.   Musculoskeletal: Negative.   Skin: Negative.  Objective:   Physical Exam General: Thin alert African American male who appears somewhat chronically ill Skin: Warm and dry no rash or infection HEENT: Hard of hearing. No palpable masses. Sclera nonicteric. Significant halitosis. Lymph nodes: No cervical, supraclavicular, or axillary nodes palpable Lungs: Clear without wheezing or increased work of breathing Cardiovascular: Regular rate and rhythm. No edema. No JVD. Abdomen: Long healed midline incision. There is a small umbilical  incisional hernia. There is fullness and tenderness across the upper abdomen particularly in the left upper quadrant where there is suggestion of a mass. Some guarding. Mild distention. Extremities: No joint swelling or deformity. Neurologic: He is alert and oriented and conversant. Speech is somewhat slow. Gait normal.    Assessment:     56 year old male with history of Crohn's colitis status post right hemicolectomy. He had progressive abdominal symptoms with pain, weight loss, and markedly dilated stool-filled transverse and proximal left colon consistent with active colitis or possibly stricture. He has some gradually elevated CEA and cannot rule out malignancy. He clearly is not going to get by his current situation and I believe will require exploration and probable subtotal colectomy. Hopefully we can find some relatively normal distal colon for anastomosis. I discussed all this with the patient and his parents. We discussed that this is a major surgery with significant risk of complication such as bleeding or infection or anastomotic leak. We discussed he may well need a temporary ostomy. I don't believe there are any realistic alternatives to surgery at this point. We reviewed all this with an anatomic chart and all their questions were answered. I would like to discuss the exact details of colonoscopy with Dr. Madilyn Fireman as I do not have this report today and we will work on getting him scheduled. He was given a bowel prep today    Plan:     Laparotomy and probable subtotal colectomy with or without an ostomy as described above.

## 2012-02-06 NOTE — Patient Instructions (Signed)
We will call you with instructions for scheduling surgery

## 2012-02-13 ENCOUNTER — Encounter (INDEPENDENT_AMBULATORY_CARE_PROVIDER_SITE_OTHER): Payer: Self-pay

## 2012-02-23 ENCOUNTER — Other Ambulatory Visit (INDEPENDENT_AMBULATORY_CARE_PROVIDER_SITE_OTHER): Payer: Self-pay | Admitting: General Surgery

## 2012-02-27 ENCOUNTER — Encounter (HOSPITAL_COMMUNITY): Payer: Self-pay | Admitting: Pharmacy Technician

## 2012-03-01 ENCOUNTER — Telehealth (INDEPENDENT_AMBULATORY_CARE_PROVIDER_SITE_OTHER): Payer: Self-pay

## 2012-03-01 ENCOUNTER — Encounter (HOSPITAL_COMMUNITY)
Admission: RE | Admit: 2012-03-01 | Discharge: 2012-03-01 | Disposition: A | Discharge status: Home / Self Care | Payer: No Typology Code available for payment source | Source: Ambulatory Visit | Attending: General Surgery | Admitting: General Surgery

## 2012-03-01 ENCOUNTER — Encounter (HOSPITAL_COMMUNITY): Payer: Self-pay

## 2012-03-01 ENCOUNTER — Encounter (INDEPENDENT_AMBULATORY_CARE_PROVIDER_SITE_OTHER): Payer: Self-pay

## 2012-03-01 ENCOUNTER — Ambulatory Visit (HOSPITAL_COMMUNITY)
Admission: RE | Admit: 2012-03-01 | Discharge: 2012-03-01 | Disposition: A | Discharge status: Home / Self Care | Payer: No Typology Code available for payment source | Source: Ambulatory Visit | Attending: General Surgery | Admitting: General Surgery

## 2012-03-01 DIAGNOSIS — Z01818 Encounter for other preprocedural examination: Secondary | ICD-10-CM | POA: Insufficient documentation

## 2012-03-01 DIAGNOSIS — Z01812 Encounter for preprocedural laboratory examination: Secondary | ICD-10-CM | POA: Insufficient documentation

## 2012-03-01 DIAGNOSIS — Z01810 Encounter for preprocedural cardiovascular examination: Secondary | ICD-10-CM | POA: Insufficient documentation

## 2012-03-01 DIAGNOSIS — J984 Other disorders of lung: Secondary | ICD-10-CM | POA: Insufficient documentation

## 2012-03-01 LAB — CBC
HCT: 27.1 % — ABNORMAL LOW (ref 39.0–52.0)
MCH: 29.4 pg (ref 26.0–34.0)
MCV: 88.6 fL (ref 78.0–100.0)
Platelets: 860 10*3/uL — ABNORMAL HIGH (ref 150–400)
RDW: 17.4 % — ABNORMAL HIGH (ref 11.5–15.5)

## 2012-03-01 LAB — BASIC METABOLIC PANEL
BUN: 9 mg/dL (ref 6–23)
Calcium: 8.1 mg/dL — ABNORMAL LOW (ref 8.4–10.5)
Creatinine, Ser: 0.56 mg/dL (ref 0.50–1.35)
GFR calc Af Amer: >90 mL/min (ref >90)

## 2012-03-01 NOTE — Pre-Procedure Instructions (Signed)
Cbc results sent to dr Fae Pippin

## 2012-03-01 NOTE — Patient Instructions (Addendum)
20 Rick Edwards  03/01/2012   Your procedure is scheduled on:  03-11-2012  Report to [facility] at 0745 AM.  Call this number if you have problems the morning of surgery: [phone]   Remember:   Do not eat food or drink liquids:After Midnight.  .  Take these medicines the morning of surgery with A SIP OF WATER: amlodipine, azathioprine   Do not wear jewelry or make up.  Do not wear lotions, powders, or perfumes.Do not wear deodorant.    Do not bring valuables to the hospital.  Contacts, dentures or bridgework may not be worn into surgery.  Leave suitcase in the car. After surgery it may be brought to your room.  For patients admitted to the hospital, checkout time is 11:00 AM the day   discharge                             Special Instructions: CHG Shower Use Special Wash: 1/2 bottle night before surgery and 1/2 bottle morning of surgery, use regular soap on face and front and back private area. You may shave your face morning of surgery.   Please read over the following fact sheets that you were given: MRSA Information  [name] pre op nurse phone number [phone], call if needed

## 2012-03-01 NOTE — Telephone Encounter (Signed)
Return call to [name] (pt father)  Family asking how long patient would be hospitalized after surgery.  Per Dr. Johna Sheriff atleast 1-2 weeks.

## 2012-03-04 LAB — MRSA CULTURE

## 2012-03-04 NOTE — Pre-Procedure Instructions (Signed)
Showed 04-01-12 ekg and 09-07-2001 ekg  To dr Council Mechanic. Pt ok for surgery.

## 2012-03-06 ENCOUNTER — Emergency Department (HOSPITAL_COMMUNITY)
Admission: EM | Admit: 2012-03-06 | Discharge: 2012-03-07 | Disposition: A | Discharge status: Home / Self Care | Payer: No Typology Code available for payment source | Attending: Emergency Medicine | Admitting: Emergency Medicine

## 2012-03-06 ENCOUNTER — Encounter (HOSPITAL_COMMUNITY): Payer: Self-pay | Admitting: Family Medicine

## 2012-03-06 DIAGNOSIS — K5090 Crohn's disease, unspecified, without complications: Secondary | ICD-10-CM | POA: Insufficient documentation

## 2012-03-06 DIAGNOSIS — R109 Unspecified abdominal pain: Secondary | ICD-10-CM | POA: Insufficient documentation

## 2012-03-06 DIAGNOSIS — D649 Anemia, unspecified: Secondary | ICD-10-CM | POA: Insufficient documentation

## 2012-03-06 DIAGNOSIS — Z87891 Personal history of nicotine dependence: Secondary | ICD-10-CM | POA: Insufficient documentation

## 2012-03-06 DIAGNOSIS — E876 Hypokalemia: Secondary | ICD-10-CM | POA: Insufficient documentation

## 2012-03-06 DIAGNOSIS — I10 Essential (primary) hypertension: Secondary | ICD-10-CM | POA: Insufficient documentation

## 2012-03-06 LAB — CBC
HCT: 23.8 % — ABNORMAL LOW (ref 39.0–52.0)
Hemoglobin: 8.1 g/dL — ABNORMAL LOW (ref 13.0–17.0)
MCHC: 34 g/dL (ref 30.0–36.0)
MCV: 87.8 fL (ref 78.0–100.0)
RDW: 17.6 % — ABNORMAL HIGH (ref 11.5–15.5)
WBC: 4.9 10*3/uL (ref 4.0–10.5)

## 2012-03-06 MED ORDER — MORPHINE SULFATE 4 MG/ML IJ SOLN
6.0000 mg | Freq: Once | INTRAMUSCULAR | Status: AC
  Administered 2012-03-06: 6 mg via INTRAVENOUS
  Filled 2012-03-06: qty 2

## 2012-03-06 MED ORDER — SODIUM CHLORIDE 0.9 % IV BOLUS (SEPSIS)
500.0000 mL | Freq: Once | INTRAVENOUS | Status: AC
  Administered 2012-03-06: 500 mL via INTRAVENOUS

## 2012-03-06 NOTE — ED Notes (Signed)
Patient states that he started having abdominal pain around 3pm. Denies n/v/d.Has a history of Crohn's disease.

## 2012-03-07 ENCOUNTER — Emergency Department (HOSPITAL_COMMUNITY): Payer: No Typology Code available for payment source

## 2012-03-07 LAB — COMPREHENSIVE METABOLIC PANEL
Albumin: 1.7 g/dL — ABNORMAL LOW (ref 3.5–5.2)
Alkaline Phosphatase: 76 U/L (ref 39–117)
BUN: 11 mg/dL (ref 6–23)
Chloride: 93 mEq/L — ABNORMAL LOW (ref 96–112)
Creatinine, Ser: 0.58 mg/dL (ref 0.50–1.35)
GFR calc Af Amer: >90 mL/min (ref >90)
GFR calc non Af Amer: >90 mL/min (ref >90)
Glucose, Bld: 118 mg/dL — ABNORMAL HIGH (ref 70–99)
Potassium: 2.4 mEq/L — CL (ref 3.5–5.1)
Total Bilirubin: 0.3 mg/dL (ref 0.3–1.2)

## 2012-03-07 MED ORDER — POTASSIUM CHLORIDE CRYS ER 20 MEQ PO TBCR: 20.0000 meq | EXTENDED_RELEASE_TABLET | Freq: Two times a day (BID) | ORAL | Status: DC

## 2012-03-07 MED ORDER — MORPHINE SULFATE 4 MG/ML IJ SOLN
INTRAMUSCULAR | Status: AC
  Administered 2012-03-07: 6 mg via INTRAVENOUS
  Filled 2012-03-07: qty 2

## 2012-03-07 MED ORDER — POTASSIUM CHLORIDE 20 MEQ/15ML (10%) PO LIQD
40.0000 meq | Freq: Once | ORAL | Status: AC
  Administered 2012-03-07: 40 meq via ORAL
  Filled 2012-03-07: qty 30

## 2012-03-07 MED ORDER — MORPHINE SULFATE 4 MG/ML IJ SOLN
6.0000 mg | Freq: Once | INTRAMUSCULAR | Status: AC
  Administered 2012-03-07: 6 mg via INTRAVENOUS

## 2012-03-07 MED ORDER — OXYCODONE-ACETAMINOPHEN 5-325 MG PO TABS: 1.0000 | ORAL_TABLET | ORAL | Status: AC | PRN

## 2012-03-07 NOTE — ED Provider Notes (Signed)
History     CSN: [id number]  Arrival date & time 03/06/12  2126   First MD Initiated Contact with Patient 03/06/12 2259      Chief Complaint  Patient presents with  . Abdominal Pain     HPI The patient began having right upper quadrant abdominal pain around 3 PM.  He's been dealing with this over the past several months.  He has planned surgery in 5 days for probable subtotal colectomy by Dr. Johna Sheriff for what appears to be colonic fecalith with possible colonic stricture.  The patient denies nausea or vomiting.  He has no diarrhea at this time.  He denies fevers or chills.  Reports the pain is having is consistent with his prior abdominal pain.  The patient's had a 2030 pound weight loss over the past several months. He has no pain medicine at home to help with this.  His pain is mild to moderate at this time    Past Medical History  Diagnosis Date  . Crohn's disease   . Perianal abscess 2001    s/p right hemicolectomy, and drainage of retroperitoneal abcess 2003  . Hypertension   . Hearing loss   . GI bleed 6/11    w/ normal EGD 6/11, 3 PRBCs   . Anemia     anemia thrombocytopenia, saw Hematology 2011, can not r/o myeloproliferative d/c    Past Surgical History  Procedure Date  . Hemicolectomy 08/29/2001    perforated abscess  . Anal fistulotomy 2002    Family History  Problem Relation Age of Onset  . Coronary artery disease Neg Hx   . Diabetes Neg Hx   . Colon cancer Neg Hx   . Prostate cancer Neg Hx   . Stroke      GF, aunts   . Hypertension      "the whole family"  . Hypertension Mother   . Hyperlipidemia Mother   . Hypertension Father   . Hyperlipidemia Father   . Heart disease Father     History  Substance Use Topics  . Smoking status: Former Smoker -- 0.2 packs/day for 9 years    Types: Cigarettes    Quit date: 05/09/2011  . Smokeless tobacco: Never Used  . Alcohol Use: No      Review of Systems  All other systems reviewed and are  negative.    Allergies  Mesalamine  Home Medications   Current Outpatient Rx  Name Route Sig Dispense Refill  . AMLODIPINE BESYLATE 5 MG PO TABS Oral Take 5 mg by mouth every morning.    . AZATHIOPRINE 50 MG PO TABS Oral Take 50 mg by mouth every morning.     Marland Kitchen SPIRONOLACTONE 25 MG PO TABS Oral Take 25 mg by mouth every morning.    Marland Kitchen POTASSIUM CHLORIDE CRYS ER 20 MEQ PO TBCR Oral Take 1 tablet (20 mEq total) by mouth 2 (two) times daily. 10 tablet 0    BP 125/76  Pulse 93  Temp 98.1 F (36.7 C) (Oral)  Resp 16  SpO2 99%  Physical Exam  Nursing note and vitals reviewed. Constitutional: He is oriented to person, place, and time. He appears well-developed and well-nourished.  HENT:  Head: Normocephalic and atraumatic.  Eyes: EOM are normal.  Neck: Normal range of motion.  Cardiovascular: Normal rate, regular rhythm, normal heart sounds and intact distal pulses.   Pulmonary/Chest: Effort normal and breath sounds normal. No respiratory distress.  Abdominal: Soft. He exhibits no distension. There is no  tenderness.       Palpable mass in right upper quadrant  Musculoskeletal: Normal range of motion.  Neurological: He is alert and oriented to person, place, and time.  Skin: Skin is warm and dry.  Psychiatric: He has a normal mood and affect. Judgment normal.    ED Course  Procedures  Labs Reviewed  CBC - Abnormal; Notable for the following:    RBC 2.71 (*)     Hemoglobin 8.1 (*)     HCT 23.8 (*)     RDW 17.6 (*)     Platelets 886 (*)     All other components within normal limits  COMPREHENSIVE METABOLIC PANEL - Abnormal; Notable for the following:    Sodium 131 (*)     Potassium 2.4 (*)     Chloride 93 (*)     Glucose, Bld 118 (*)     Calcium 7.6 (*)     Total Protein 5.5 (*)     Albumin 1.7 (*)     All other components within normal limits   Dg Abd 2 Views  03/07/2012  *RADIOLOGY REPORT*  Clinical Data: Abdominal pain, distension.  ABDOMEN - 2 VIEW  Comparison:  11/17/2011 CT  Findings: Heart size upper normal to mildly enlarged.  Linear retrocardiac opacity is likely scarring or atelectasis.  Moderate stool burden and right upper quadrant bowel distension, corresponding to the location of a known enterocolonic anastomoses. No overt evidence for obstruction.  Surgical clips project over the right upper quadrant. No acute osseous finding.  IMPRESSION: Moderate stool burden.  Similar to prior, right upper quadrant bowel distension, corresponding to the location of a known enterocolonic anastomoses. No overt evidence for obstruction.  Original Report Authenticated By: Waneta Martins, M.D.    I personally reviewed the imaging tests through PACS system I reviewed available ER/hospitalization records thought the EMR   1. Abdominal pain   2. Hypokalemia       MDM  The patient has planned surgery 5 days from now for what looks to represent fecalith for possible colonic stricture.  The patient's pain is significantly improved in the emergency department.  He does have new hypokalemia.  This is being replaced in the emergency department he'll be sent home on potassium supplementation.  There is no indication this time for admission.  He understands return the emergency department for new or worsening symptoms.         Lyanne Co, MD 03/07/12 [phone]

## 2012-03-11 ENCOUNTER — Encounter (HOSPITAL_COMMUNITY): Payer: Self-pay | Admitting: *Deleted

## 2012-03-11 ENCOUNTER — Ambulatory Visit (HOSPITAL_COMMUNITY): Payer: No Typology Code available for payment source | Admitting: Anesthesiology

## 2012-03-11 ENCOUNTER — Inpatient Hospital Stay (HOSPITAL_COMMUNITY)
Admission: RE | Admit: 2012-03-11 | Discharge: 2012-03-28 | DRG: 329 | Disposition: A | Discharge status: Home / Self Care | Payer: No Typology Code available for payment source | Source: Ambulatory Visit | Attending: General Surgery | Admitting: General Surgery

## 2012-03-11 ENCOUNTER — Encounter (HOSPITAL_COMMUNITY): Payer: Self-pay | Admitting: Anesthesiology

## 2012-03-11 ENCOUNTER — Encounter (HOSPITAL_COMMUNITY)
Admission: RE | Disposition: A | Discharge status: Home / Self Care | Payer: Self-pay | Source: Ambulatory Visit | Attending: General Surgery

## 2012-03-11 DIAGNOSIS — J90 Pleural effusion, not elsewhere classified: Secondary | ICD-10-CM

## 2012-03-11 DIAGNOSIS — Y9210 Unspecified residential institution as the place of occurrence of the external cause: Secondary | ICD-10-CM | POA: Diagnosis not present

## 2012-03-11 DIAGNOSIS — K660 Peritoneal adhesions (postprocedural) (postinfection): Secondary | ICD-10-CM | POA: Diagnosis present

## 2012-03-11 DIAGNOSIS — D62 Acute posthemorrhagic anemia: Secondary | ICD-10-CM | POA: Diagnosis not present

## 2012-03-11 DIAGNOSIS — I472 Ventricular tachycardia, unspecified: Secondary | ICD-10-CM | POA: Diagnosis not present

## 2012-03-11 DIAGNOSIS — K5090 Crohn's disease, unspecified, without complications: Secondary | ICD-10-CM

## 2012-03-11 DIAGNOSIS — I4729 Other ventricular tachycardia: Secondary | ICD-10-CM | POA: Diagnosis not present

## 2012-03-11 DIAGNOSIS — I10 Essential (primary) hypertension: Secondary | ICD-10-CM | POA: Diagnosis present

## 2012-03-11 DIAGNOSIS — D72829 Elevated white blood cell count, unspecified: Secondary | ICD-10-CM | POA: Diagnosis present

## 2012-03-11 DIAGNOSIS — H9190 Unspecified hearing loss, unspecified ear: Secondary | ICD-10-CM | POA: Diagnosis present

## 2012-03-11 DIAGNOSIS — E876 Hypokalemia: Secondary | ICD-10-CM | POA: Diagnosis present

## 2012-03-11 DIAGNOSIS — E43 Unspecified severe protein-calorie malnutrition: Secondary | ICD-10-CM | POA: Diagnosis present

## 2012-03-11 DIAGNOSIS — R634 Abnormal weight loss: Secondary | ICD-10-CM | POA: Diagnosis present

## 2012-03-11 DIAGNOSIS — I739 Peripheral vascular disease, unspecified: Secondary | ICD-10-CM | POA: Diagnosis present

## 2012-03-11 DIAGNOSIS — D735 Infarction of spleen: Secondary | ICD-10-CM

## 2012-03-11 DIAGNOSIS — E875 Hyperkalemia: Secondary | ICD-10-CM | POA: Diagnosis present

## 2012-03-11 DIAGNOSIS — Z79899 Other long term (current) drug therapy: Secondary | ICD-10-CM

## 2012-03-11 DIAGNOSIS — Y838 Other surgical procedures as the cause of abnormal reaction of the patient, or of later complication, without mention of misadventure at the time of the procedure: Secondary | ICD-10-CM | POA: Diagnosis not present

## 2012-03-11 DIAGNOSIS — K5010 Crohn's disease of large intestine without complications: Principal | ICD-10-CM | POA: Diagnosis present

## 2012-03-11 DIAGNOSIS — Z87891 Personal history of nicotine dependence: Secondary | ICD-10-CM

## 2012-03-11 DIAGNOSIS — K5669 Other intestinal obstruction: Secondary | ICD-10-CM | POA: Diagnosis present

## 2012-03-11 DIAGNOSIS — I998 Other disorder of circulatory system: Secondary | ICD-10-CM | POA: Diagnosis not present

## 2012-03-11 HISTORY — PX: PARTIAL COLECTOMY: SHX5273

## 2012-03-11 LAB — BASIC METABOLIC PANEL
BUN: 16 mg/dL (ref 6–23)
Chloride: 107 mEq/L (ref 96–112)
GFR calc Af Amer: >90 mL/min (ref >90)
Glucose, Bld: 95 mg/dL (ref 70–99)
Potassium: 4.5 mEq/L (ref 3.5–5.1)

## 2012-03-11 LAB — CBC
HCT: 40.4 % (ref 39.0–52.0)
Hemoglobin: 13.8 g/dL (ref 13.0–17.0)
WBC: 8 10*3/uL (ref 4.0–10.5)

## 2012-03-11 LAB — POTASSIUM: Potassium: 5.3 mEq/L — ABNORMAL HIGH (ref 3.5–5.1)

## 2012-03-11 SURGERY — COLECTOMY, PARTIAL
Anesthesia: General | Site: Abdomen | Wound class: Contaminated

## 2012-03-11 MED ORDER — PROPOFOL 10 MG/ML IV EMUL
INTRAVENOUS | Status: DC | PRN
  Administered 2012-03-11: 150 mg via INTRAVENOUS

## 2012-03-11 MED ORDER — KCL IN DEXTROSE-NACL 20-5-0.9 MEQ/L-%-% IV SOLN
INTRAVENOUS | Status: DC
  Administered 2012-03-11 – 2012-03-12 (×2): via INTRAVENOUS
  Filled 2012-03-11 (×3): qty 1000

## 2012-03-11 MED ORDER — SODIUM CHLORIDE 0.9 % IV SOLN
INTRAVENOUS | Status: DC | PRN
  Administered 2012-03-11: 13:00:00 via INTRAVENOUS

## 2012-03-11 MED ORDER — LACTATED RINGERS IV SOLN
INTRAVENOUS | Status: DC | PRN
  Administered 2012-03-11: 09:00:00 via INTRAVENOUS

## 2012-03-11 MED ORDER — HYDROMORPHONE HCL PF 1 MG/ML IJ SOLN
0.2500 mg | INTRAMUSCULAR | Status: DC | PRN
  Administered 2012-03-11 (×4): 0.5 mg via INTRAVENOUS

## 2012-03-11 MED ORDER — PROMETHAZINE HCL 25 MG/ML IJ SOLN: 6.2500 mg | INTRAMUSCULAR | Status: DC | PRN

## 2012-03-11 MED ORDER — SODIUM CHLORIDE 0.9 % IV SOLN
INTRAVENOUS | Status: DC | PRN
  Administered 2012-03-11 (×2): via INTRAVENOUS

## 2012-03-11 MED ORDER — KCL IN DEXTROSE-NACL 20-5-0.9 MEQ/L-%-% IV SOLN
INTRAVENOUS | Status: AC
  Filled 2012-03-11: qty 1000

## 2012-03-11 MED ORDER — CHLORHEXIDINE GLUCONATE 0.12 % MT SOLN
15.0000 mL | Freq: Two times a day (BID) | OROMUCOSAL | Status: DC
  Administered 2012-03-12 – 2012-03-19 (×14): 15 mL via OROMUCOSAL
  Filled 2012-03-11 (×17): qty 15

## 2012-03-11 MED ORDER — ONDANSETRON HCL 4 MG/2ML IJ SOLN
INTRAMUSCULAR | Status: DC | PRN
  Administered 2012-03-11 (×2): 2 mg via INTRAVENOUS

## 2012-03-11 MED ORDER — PHENYLEPHRINE HCL 10 MG/ML IJ SOLN
INTRAMUSCULAR | Status: DC | PRN
  Administered 2012-03-11: 20 ug via INTRAVENOUS
  Administered 2012-03-11: 40 ug via INTRAVENOUS
  Administered 2012-03-11: 20 ug via INTRAVENOUS
  Administered 2012-03-11 (×2): 40 ug via INTRAVENOUS
  Administered 2012-03-11: 20 ug via INTRAVENOUS
  Administered 2012-03-11: 40 ug via INTRAVENOUS

## 2012-03-11 MED ORDER — NEOSTIGMINE METHYLSULFATE 1 MG/ML IJ SOLN
INTRAMUSCULAR | Status: DC | PRN
  Administered 2012-03-11: 3 mg via INTRAVENOUS

## 2012-03-11 MED ORDER — HYDROMORPHONE HCL PF 1 MG/ML IJ SOLN
INTRAMUSCULAR | Status: AC
  Filled 2012-03-11: qty 1

## 2012-03-11 MED ORDER — HYDROMORPHONE HCL PF 1 MG/ML IJ SOLN
1.0000 mg | INTRAMUSCULAR | Status: DC | PRN
  Administered 2012-03-11 – 2012-03-12 (×5): 1 mg via INTRAVENOUS
  Administered 2012-03-12: 2 mg via INTRAVENOUS
  Administered 2012-03-12 – 2012-03-13 (×7): 1 mg via INTRAVENOUS
  Administered 2012-03-13: 3 mg via INTRAVENOUS
  Administered 2012-03-13: 2 mg via INTRAVENOUS
  Administered 2012-03-13: 3 mg via INTRAVENOUS
  Administered 2012-03-14: 1 mg via INTRAVENOUS
  Administered 2012-03-14: 2 mg via INTRAVENOUS
  Administered 2012-03-14: 3 mg via INTRAVENOUS
  Administered 2012-03-14: 2 mg via INTRAVENOUS
  Administered 2012-03-14: 1 mg via INTRAVENOUS
  Administered 2012-03-14: 2 mg via INTRAVENOUS
  Administered 2012-03-14: 3 mg via INTRAVENOUS
  Administered 2012-03-15 – 2012-03-16 (×6): 2 mg via INTRAVENOUS
  Administered 2012-03-16 – 2012-03-19 (×29): 1 mg via INTRAVENOUS
  Administered 2012-03-20: 3 mg via INTRAVENOUS
  Administered 2012-03-20 (×5): 2 mg via INTRAVENOUS
  Administered 2012-03-20 (×2): 1 mg via INTRAVENOUS
  Administered 2012-03-20: 2 mg via INTRAVENOUS
  Administered 2012-03-20 – 2012-03-21 (×2): 1 mg via INTRAVENOUS
  Administered 2012-03-21: 2 mg via INTRAVENOUS
  Administered 2012-03-21 (×2): 3 mg via INTRAVENOUS
  Administered 2012-03-21 (×3): 2 mg via INTRAVENOUS
  Administered 2012-03-21: 3 mg via INTRAVENOUS
  Administered 2012-03-21: 2 mg via INTRAVENOUS
  Administered 2012-03-22 – 2012-03-23 (×14): 3 mg via INTRAVENOUS
  Administered 2012-03-23: 2 mg via INTRAVENOUS
  Administered 2012-03-24 (×4): 3 mg via INTRAVENOUS
  Administered 2012-03-24: 2 mg via INTRAVENOUS
  Administered 2012-03-24 (×2): 3 mg via INTRAVENOUS
  Administered 2012-03-25 – 2012-03-27 (×14): 2 mg via INTRAVENOUS
  Administered 2012-03-27: 1 mg via INTRAVENOUS
  Administered 2012-03-27: 2 mg via INTRAVENOUS
  Administered 2012-03-27: 1 mg via INTRAVENOUS
  Administered 2012-03-28: 2 mg via INTRAVENOUS
  Filled 2012-03-11: qty 2
  Filled 2012-03-11 (×2): qty 1
  Filled 2012-03-11: qty 2
  Filled 2012-03-11 (×2): qty 3
  Filled 2012-03-11 (×2): qty 1
  Filled 2012-03-11: qty 2
  Filled 2012-03-11: qty 1
  Filled 2012-03-11: qty 2
  Filled 2012-03-11: qty 1
  Filled 2012-03-11 (×3): qty 2
  Filled 2012-03-11: qty 1
  Filled 2012-03-11: qty 2
  Filled 2012-03-11: qty 1
  Filled 2012-03-11: qty 2
  Filled 2012-03-11: qty 3
  Filled 2012-03-11 (×2): qty 1
  Filled 2012-03-11 (×3): qty 3
  Filled 2012-03-11: qty 1
  Filled 2012-03-11: qty 2
  Filled 2012-03-11: qty 1
  Filled 2012-03-11: qty 3
  Filled 2012-03-11: qty 1
  Filled 2012-03-11 (×2): qty 2
  Filled 2012-03-11: qty 1
  Filled 2012-03-11: qty 2
  Filled 2012-03-11: qty 1
  Filled 2012-03-11: qty 2
  Filled 2012-03-11: qty 1
  Filled 2012-03-11 (×2): qty 3
  Filled 2012-03-11 (×4): qty 1
  Filled 2012-03-11 (×4): qty 3
  Filled 2012-03-11: qty 2
  Filled 2012-03-11: qty 1
  Filled 2012-03-11 (×2): qty 2
  Filled 2012-03-11: qty 3
  Filled 2012-03-11: qty 2
  Filled 2012-03-11: qty 1
  Filled 2012-03-11: qty 2
  Filled 2012-03-11: qty 3
  Filled 2012-03-11: qty 1
  Filled 2012-03-11 (×2): qty 2
  Filled 2012-03-11 (×2): qty 1
  Filled 2012-03-11: qty 3
  Filled 2012-03-11: qty 1
  Filled 2012-03-11: qty 2
  Filled 2012-03-11 (×2): qty 1
  Filled 2012-03-11: qty 3
  Filled 2012-03-11 (×2): qty 1
  Filled 2012-03-11: qty 2
  Filled 2012-03-11: qty 3
  Filled 2012-03-11 (×2): qty 2
  Filled 2012-03-11 (×2): qty 1
  Filled 2012-03-11: qty 2
  Filled 2012-03-11 (×2): qty 1
  Filled 2012-03-11: qty 3
  Filled 2012-03-11: qty 1
  Filled 2012-03-11 (×3): qty 2
  Filled 2012-03-11 (×3): qty 3
  Filled 2012-03-11: qty 1
  Filled 2012-03-11: qty 2
  Filled 2012-03-11 (×2): qty 1
  Filled 2012-03-11: qty 3
  Filled 2012-03-11 (×2): qty 2
  Filled 2012-03-11: qty 1
  Filled 2012-03-11: qty 3
  Filled 2012-03-11 (×2): qty 2
  Filled 2012-03-11: qty 1
  Filled 2012-03-11: qty 2
  Filled 2012-03-11: qty 1
  Filled 2012-03-11: qty 2
  Filled 2012-03-11: qty 3
  Filled 2012-03-11: qty 1
  Filled 2012-03-11: qty 2
  Filled 2012-03-11 (×2): qty 1
  Filled 2012-03-11 (×2): qty 3
  Filled 2012-03-11: qty 2
  Filled 2012-03-11: qty 1
  Filled 2012-03-11 (×2): qty 2
  Filled 2012-03-11: qty 1
  Filled 2012-03-11: qty 3
  Filled 2012-03-11 (×3): qty 1

## 2012-03-11 MED ORDER — ONDANSETRON HCL 4 MG/2ML IJ SOLN: 4.0000 mg | Freq: Four times a day (QID) | INTRAMUSCULAR | Status: DC | PRN

## 2012-03-11 MED ORDER — SODIUM CHLORIDE 0.9 % IV BOLUS (SEPSIS)
1000.0000 mL | Freq: Once | INTRAVENOUS | Status: AC
  Administered 2012-03-11: 1000 mL via INTRAVENOUS

## 2012-03-11 MED ORDER — GLYCOPYRROLATE 0.2 MG/ML IJ SOLN
INTRAMUSCULAR | Status: DC | PRN
  Administered 2012-03-11: .4 mg via INTRAVENOUS

## 2012-03-11 MED ORDER — FENTANYL CITRATE 0.05 MG/ML IJ SOLN
INTRAMUSCULAR | Status: DC | PRN
  Administered 2012-03-11 (×2): 50 ug via INTRAVENOUS
  Administered 2012-03-11 (×2): 100 ug via INTRAVENOUS
  Administered 2012-03-11 (×4): 50 ug via INTRAVENOUS
  Administered 2012-03-11: 100 ug via INTRAVENOUS
  Administered 2012-03-11: 50 ug via INTRAVENOUS

## 2012-03-11 MED ORDER — ONDANSETRON HCL 4 MG PO TABS: 4.0000 mg | ORAL_TABLET | Freq: Four times a day (QID) | ORAL | Status: DC | PRN

## 2012-03-11 MED ORDER — KCL IN DEXTROSE-NACL 20-5-0.9 MEQ/L-%-% IV SOLN
INTRAVENOUS | Status: DC
  Administered 2012-03-11: 17:00:00 via INTRAVENOUS

## 2012-03-11 MED ORDER — ACETAMINOPHEN 10 MG/ML IV SOLN
INTRAVENOUS | Status: DC | PRN
  Administered 2012-03-11: 1000 mg via INTRAVENOUS

## 2012-03-11 MED ORDER — CISATRACURIUM BESYLATE (PF) 10 MG/5ML IV SOLN
INTRAVENOUS | Status: DC | PRN
  Administered 2012-03-11: 4 mg via INTRAVENOUS
  Administered 2012-03-11: 12 mg via INTRAVENOUS
  Administered 2012-03-11: 4 mg via INTRAVENOUS
  Administered 2012-03-11 (×2): 8 mg via INTRAVENOUS

## 2012-03-11 MED ORDER — LACTATED RINGERS IV SOLN: INTRAVENOUS | Status: DC

## 2012-03-11 MED ORDER — ACETAMINOPHEN 10 MG/ML IV SOLN
INTRAVENOUS | Status: AC
  Filled 2012-03-11: qty 100

## 2012-03-11 MED ORDER — SODIUM CHLORIDE 0.9 % IV SOLN: INTRAVENOUS | Status: DC

## 2012-03-11 MED ORDER — SODIUM CHLORIDE 0.9 % IV SOLN
1.0000 g | INTRAVENOUS | Status: DC
  Administered 2012-03-11 – 2012-03-19 (×8): 1 g via INTRAVENOUS
  Filled 2012-03-11 (×11): qty 1

## 2012-03-11 MED ORDER — 0.9 % SODIUM CHLORIDE (POUR BTL) OPTIME
TOPICAL | Status: DC | PRN
  Administered 2012-03-11: 10000 mL

## 2012-03-11 MED ORDER — SODIUM CHLORIDE 0.9 % IV SOLN
INTRAVENOUS | Status: AC
  Filled 2012-03-11: qty 1

## 2012-03-11 MED ORDER — SODIUM CHLORIDE 0.9 % IV SOLN
INTRAVENOUS | Status: DC | PRN
  Administered 2012-03-11 (×3): via INTRAVENOUS

## 2012-03-11 MED ORDER — LIDOCAINE HCL (CARDIAC) 20 MG/ML IV SOLN
INTRAVENOUS | Status: DC | PRN
  Administered 2012-03-11: 50 mg via INTRAVENOUS

## 2012-03-11 MED ORDER — HEPARIN SODIUM (PORCINE) 5000 UNIT/ML IJ SOLN
5000.0000 [IU] | Freq: Three times a day (TID) | INTRAMUSCULAR | Status: DC
  Administered 2012-03-12: 5000 [IU] via SUBCUTANEOUS
  Filled 2012-03-11 (×4): qty 1

## 2012-03-11 MED ORDER — SODIUM CHLORIDE 0.9 % IV SOLN
1.0000 g | INTRAVENOUS | Status: AC
  Administered 2012-03-11: 1 g via INTRAVENOUS

## 2012-03-11 MED ORDER — BIOTENE DRY MOUTH MT LIQD
15.0000 mL | Freq: Two times a day (BID) | OROMUCOSAL | Status: DC
  Administered 2012-03-13 – 2012-03-24 (×21): 15 mL via OROMUCOSAL

## 2012-03-11 MED ORDER — MIDAZOLAM HCL 5 MG/5ML IJ SOLN
INTRAMUSCULAR | Status: DC | PRN
  Administered 2012-03-11 (×2): 1 mg via INTRAVENOUS

## 2012-03-11 SURGICAL SUPPLY — 63 items
APPLICATOR COTTON TIP 6IN STRL (MISCELLANEOUS) IMPLANT
BANDAGE GAUZE ELAST BULKY 4 IN (GAUZE/BANDAGES/DRESSINGS) ×1 IMPLANT
BLADE EXTENDED COATED 6.5IN (ELECTRODE) ×1 IMPLANT
BLADE HEX COATED 2.75 (ELECTRODE) ×2 IMPLANT
BLADE SURG SZ10 CARB STEEL (BLADE) IMPLANT
CANISTER SUCTION 2500CC (MISCELLANEOUS) ×2 IMPLANT
CLAMP POUCH DRAINAGE QUIET (OSTOMY) ×1 IMPLANT
CLIP TI LARGE 6 (CLIP) IMPLANT
CLOTH BEACON ORANGE TIMEOUT ST (SAFETY) ×2 IMPLANT
COVER MAYO STAND STRL (DRAPES) ×2 IMPLANT
DRAPE LAPAROSCOPIC ABDOMINAL (DRAPES) ×2 IMPLANT
DRAPE LG THREE QUARTER DISP (DRAPES) IMPLANT
DRAPE WARM FLUID 44X44 (DRAPE) ×2 IMPLANT
DRSG PAD ABDOMINAL 8X10 ST (GAUZE/BANDAGES/DRESSINGS) IMPLANT
ELECT REM PT RETURN 9FT ADLT (ELECTROSURGICAL) ×2
ELECTRODE REM PT RTRN 9FT ADLT (ELECTROSURGICAL) ×1 IMPLANT
ENSEAL DEVICE STD TIP 35CM (ENDOMECHANICALS) ×2 IMPLANT
GAUZE SPONGE 4X4 16PLY XRAY LF (GAUZE/BANDAGES/DRESSINGS) ×2 IMPLANT
GLOVE BIOGEL PI IND STRL 7.0 (GLOVE) ×1 IMPLANT
GLOVE BIOGEL PI IND STRL 7.5 (GLOVE) ×2 IMPLANT
GLOVE BIOGEL PI INDICATOR 7.0 (GLOVE) ×1
GLOVE BIOGEL PI INDICATOR 7.5 (GLOVE) ×2
GLOVE SS BIOGEL STRL SZ 7.5 (GLOVE) ×2 IMPLANT
GLOVE SUPERSENSE BIOGEL SZ 7.5 (GLOVE) ×2
GOWN STRL NON-REIN LRG LVL3 (GOWN DISPOSABLE) ×2 IMPLANT
GOWN STRL REIN XL XLG (GOWN DISPOSABLE) ×4 IMPLANT
HAND ACTIVATED (MISCELLANEOUS) IMPLANT
KIT BASIN OR (CUSTOM PROCEDURE TRAY) ×2 IMPLANT
LEGGING LITHOTOMY PAIR STRL (DRAPES) IMPLANT
LIGASURE IMPACT 36 18CM CVD LR (INSTRUMENTS) ×2 IMPLANT
NS IRRIG 1000ML POUR BTL (IV SOLUTION) ×19 IMPLANT
PACK GENERAL/GYN (CUSTOM PROCEDURE TRAY) ×2 IMPLANT
PAD ABD 7.5X8 STRL (GAUZE/BANDAGES/DRESSINGS) ×1 IMPLANT
POUCH OSTOMY 1 PC DRNBL  2 1/2 (OSTOMY) IMPLANT
POUCH OSTOMY 2 1/2 (OSTOMY) ×2
RELOAD PROXIMATE 75MM BLUE (ENDOMECHANICALS) ×6 IMPLANT
SEALER TISSUE G2 CVD JAW 35 (ENDOMECHANICALS) IMPLANT
SEALER TISSUE G2 CVD JAW 45CM (ENDOMECHANICALS)
SPONGE GAUZE 4X4 12PLY (GAUZE/BANDAGES/DRESSINGS) ×2 IMPLANT
SPONGE LAP 18X18 X RAY DECT (DISPOSABLE) ×4 IMPLANT
STAPLER PROXIMATE 75MM BLUE (STAPLE) ×2 IMPLANT
STAPLER VISISTAT 35W (STAPLE) ×2 IMPLANT
SUCTION POOLE TIP (SUCTIONS) ×9 IMPLANT
SUT NOV 1 T60/GS (SUTURE) IMPLANT
SUT NOVA 1 T20/GS 25DT (SUTURE) ×4 IMPLANT
SUT NOVA NAB DX-16 0-1 5-0 T12 (SUTURE) IMPLANT
SUT NOVA T20/GS 25 (SUTURE) IMPLANT
SUT PDS AB 1 TP1 96 (SUTURE) ×6 IMPLANT
SUT PROLENE 0 SH 30 (SUTURE) ×1 IMPLANT
SUT SILK 2 0 (SUTURE) ×2
SUT SILK 2 0 SH CR/8 (SUTURE) ×3 IMPLANT
SUT SILK 2 0SH CR/8 30 (SUTURE) IMPLANT
SUT SILK 2-0 18XBRD TIE 12 (SUTURE) ×1 IMPLANT
SUT SILK 2-0 30XBRD TIE 12 (SUTURE) IMPLANT
SUT SILK 3 0 (SUTURE) ×2
SUT SILK 3 0 SH CR/8 (SUTURE) ×2 IMPLANT
SUT SILK 3-0 18XBRD TIE 12 (SUTURE) ×2 IMPLANT
SUT VIC AB 3-0 SH 18 (SUTURE) ×2 IMPLANT
TAPE CLOTH SURG 4X10 WHT LF (GAUZE/BANDAGES/DRESSINGS) ×1 IMPLANT
TOWEL OR 17X26 10 PK STRL BLUE (TOWEL DISPOSABLE) ×4 IMPLANT
TRAY FOLEY CATH 14FRSI W/METER (CATHETERS) ×2 IMPLANT
WATER STERILE IRR 1500ML POUR (IV SOLUTION) IMPLANT
YANKAUER SUCT BULB TIP NO VENT (SUCTIONS) ×3 IMPLANT

## 2012-03-11 NOTE — Transfer of Care (Signed)
Immediate Anesthesia Transfer of Care Note  Patient: Rick Edwards  Procedure(s) Performed: Procedure(s) (LRB): PARTIAL COLECTOMY (N/A)  Patient Location: PACU  Anesthesia Type: General  Level of Consciousness: awake, oriented, patient cooperative, lethargic and responds to stimulation  Airway & Oxygen Therapy: Patient Spontanous Breathing and Patient connected to face mask oxygen  Post-op Assessment: Report given to PACU RN, Post -op Vital signs reviewed and stable and Patient moving all extremities  Post vital signs: Reviewed and stable  Complications: No apparent anesthesia complications

## 2012-03-11 NOTE — Op Note (Signed)
Preoperative Diagnosis: CROHN'S COLITIS WITH OBSTRUCTION  Postoprative Diagnosis: CROHN'S COLITIS WITH OBSTRUCTION  Procedure: Procedure(s): Subtotal colectomy with end ileostomy and repair of enterotomy   Surgeon: Glenna Fellows T   Assistants: Gaynelle Adu M.D.  Anesthesia:  General endotracheal anesthesiaDiagnos  Indications:   Patient is a 56 year old male with a history 10 years ago of severe Crohn's colitis of the right colon with perforation into the retroperitoneum and retroperitoneal abscess status post right hemicolectomy with anastomosis by me at that time. He has done well until the last year or 2 when he has developed gradually worsening postprandial abdominal pain and distention. He has recently had workup with colonoscopy and CT scan. CT shows a massively dilated transverse colon with thickening and inflammatory change extending over to the left colon. Colonoscopy has shown inflamed shaggy the toes in the left colon with inspissated stool in his ability to pass the scope. He now has progressive weight loss and severe upper probable pain and distention with meals. We have recommended proceeding with urgent colectomy and possible anastomosis or ostomy. I discussed the procedure in detail with the patient and his family with indications and risks all detailed elsewhere. He was able to tolerate some bowel prep although had some vomiting. He is now brought to the operating room for this procedure.  Procedure Detail:  Patient is brought to the operating room, placed in supine position on the operating table, and general endotracheal anesthesia induced. He was given broad-spectrum preoperative IV antibiotics. PAS were in place. The abdomen was widely sterilely prepped and draped. Patient timeout was performed and correct procedure verified. The previous midline scar was excised and dissection carried down through the subcutaneous tissue and midline fascia and the peritoneum entered  under direct vision. There were filmy omental adhesions to the anterior abdominal wall which rolled taken down. Exploration revealed massive distention of the proximal transverse colon near the anastomosis. In the mid transverse colon was a very hard markedly inflamed and enlarged segment which was obviously a point of obstruction. The small bowel was also moderately dilated and was quite edematous and somewhat thickened. Initially interloop adhesions of small bowel were taken down with careful sharp dissection. These adhesions were fairly filmy. However while taking down adhesions an enterotomy developed in the terminal ileum even though we felt that we had not come close to the bowel wall with our dissection. This possibly resulted from traction on the adhesions and with quite friable bowel. There was significant liquid stool contamination which was suctioned and irrigated. The defect was closed transversely with interrupted full-thickness 2-0 silk sutures. The abdomen was thoroughly irrigated and instruments and drapes changed. After mobilizing examining the small bowel and identifying the ileocolonic anastomosis the distal right colon at the anastomosis was mobilized up out of the retroperitoneum as was the distal small bowel. The right ureter was identified and protected. The omentum was then dissected off and away from the transverse colon using cautery and LigaSure dissection. The lesser sac was entered and the transverse colon further mobilized distally. The more distal transverse colon was somewhat dilated and thickened but did not appear to be involved with Crohn's disease. However in the mid left colon was another markedly thickened inflamed indurated area again appearing to be partially obstructed. The more distal sigmoid and rectosigmoid appeared normal. We then mobilized the entire rectosigmoid and left colon dividing lateral peritoneal attachments and mobilizing this up out of the retroperitoneum. The  left ureter was clearly identified and protected throughout the remainder  of the dissection. The splenic flexure was taken down with the LigaSure were at this point the entire colon down to the sigmoid was completely mobilized. At this point I was hoping to perform an anastomosis of the ileum to the sigmoid colon with a protecting loop ileostomy. The sigmoid was divided about 5 cm above the pelvic brim with the GIA 75 mm stapler. The ileum was divided just proximal to the ileocolonic anastomosis with this same stapler care the mesentery of the distal right colon and transverse colon and proximal left colon were then sequentially divided with the LigaSure device. Larger vessels were additionally ligated proximally. The specimen was removed. At this point about the last 10 cm of ileum appeared slightly dusky and had been markedly dilated due to chronic obstruction. The previous enterotomy was just about 10 cm from the divided end of the ileum and with these 2 factors we went ahead and resected the last 10 cm of ileum dividing the bowel again with the GIA 75 mm stapler and divided the mesentery with the LigaSure were. In order to provide mobility the small bowel for possible anastomosis and diverting ileostomy we began lysing a few more interloop adhesions. Again however there was an enterotomy also without ever clearly dissecting into the bowel wall again felt probably secondary to what was really minimal traction. This again was suctioned and cleaned of the small bowel completely decompressed. This enterotomy was again closed transversely with interrupted full-thickness 2-0 silk. At this point I felt that the small intestine was so friable and edematous secondary to chronic obstruction and severe malnutrition with a preoperative albumin of 1.7 that even a protected anastomosis was not prudent. I therefore elected to leave the sigmoid stump and perform an end ileostomy. The abdomen was copiously irrigated with  multiple liters of warm saline and complete hemostasis assured. An area in the right midabdomen was chosen for ileostomy and a skin button excised and the fascia incised in a cruciate fashion. The rectus muscle was bluntly split and the peritoneum incised. This was dilated to 2 fingers and the ileum brought out as an end ileostomy without difficulty. We again examined the enterotomy repair which was approximately 20-25 cm proximal to this and it appears secure and was airtight to pressure. There was no other areas of concern on the bowel or bleeding. The patient had a 2 cm umbilical hernia and this was mobilized dissecting skin up off the hernia sac and defect and extending this out beyond the hernia defect to healthy fascia. The midline fascia was then closed using running looped #1 PDS begun the uterine incision and tied centrally with interrupted internal retention sutures of #1 Novafil. Prior to closure I had marked the rectosigmoid stump with 2 Prolene sutures and again this was about 5 cm above the pelvic brim. The ileostomy was then everted and matured with interrupted 3-0 Vicryl's. The wound was left packed open with moist saline gauze. Sponge needle and its counts were correct.  Findings: Severe colitis and partial obstruction involving the mid transverse colon and the mid descending colon.  Estimated Blood Loss:  200 mL         Drains: none  Blood Given: 300 CC PRBC          Specimens: subtotal colectomy and a portion of ileum.        Complications:  * No complications entered in OR log *         Disposition: PACU - hemodynamically stable.  Condition: stable  Mariella Saa MD, FACS  03/11/2012, 3:01 PM

## 2012-03-11 NOTE — Preoperative (Signed)
Beta Blockers   Reason not to administer Beta Blockers:Not Applicable 

## 2012-03-11 NOTE — Anesthesia Preprocedure Evaluation (Addendum)
Anesthesia Evaluation  Patient identified by MRN, date of birth, ID band Patient awake    Reviewed: Allergy & Precautions, H&P , NPO status , Patient's Chart, lab work & pertinent test results  Airway Mallampati: II TM Distance: >3 FB Neck ROM: Full    Dental  (+) Poor Dentition and Dental Advisory Given Broken upper incisors, denies loose teeth.:   Pulmonary neg pulmonary ROS,  breath sounds clear to auscultation  Pulmonary exam normal       Cardiovascular hypertension, Pt. on medications negative cardio ROS  Rhythm:Regular Rate:Normal  H/O anemia, and thrombocytopenia, saw hematologist in 2011, can not r/o myeloproliferative d/o. Hemoglobin 8.1 on 7/31   Neuro/Psych Hard of hearing. negative psych ROS   GI/Hepatic negative GI ROS, Neg liver ROS,   Endo/Other  negative endocrine ROS  Renal/GU negative Renal ROS  negative genitourinary   Musculoskeletal negative musculoskeletal ROS (+)   Abdominal   Peds negative pediatric ROS (+)  Hematology negative hematology ROS (+)   Anesthesia Other Findings   Reproductive/Obstetrics negative OB ROS                         Anesthesia Physical Anesthesia Plan  ASA: III  Anesthesia Plan: General   Post-op Pain Management:    Induction: Intravenous  Airway Management Planned: Oral ETT  Additional Equipment:   Intra-op Plan:   Post-operative Plan: Extubation in OR  Informed Consent: I have reviewed the patients History and Physical, chart, labs and discussed the procedure including the risks, benefits and alternatives for the proposed anesthesia with the patient or authorized representative who has indicated his/her understanding and acceptance.   Dental advisory given  Plan Discussed with: CRNA  Anesthesia Plan Comments: (Potassium 5.3 after supplementation. May need pRBC transfusion.)      Anesthesia Quick Evaluation

## 2012-03-11 NOTE — Anesthesia Postprocedure Evaluation (Signed)
  Anesthesia Post-op Note  Patient: Rick Edwards  Procedure(s) Performed: Procedure(s) (LRB): PARTIAL COLECTOMY (N/A)  Patient Location: PACU  Anesthesia Type: General  Level of Consciousness: awake and alert   Airway and Oxygen Therapy: Patient Spontanous Breathing  Post-op Pain: mild  Post-op Assessment: Post-op Vital signs reviewed, Patient's Cardiovascular Status Stable, Respiratory Function Stable, Patent Airway and No signs of Nausea or vomiting  Post-op Vital Signs: stable  Complications: No apparent anesthesia complications

## 2012-03-11 NOTE — Anesthesia Procedure Notes (Signed)
Procedure Name: Intubation Date/Time: 03/11/2012 11:15 AM Performed by: Edison Pace Pre-anesthesia Checklist: Patient identified, Timeout performed, Emergency Drugs available, Suction available and Patient being monitored Patient Re-evaluated:Patient Re-evaluated prior to inductionOxygen Delivery Method: Circle system utilized Preoxygenation: Pre-oxygenation with 100% oxygen Intubation Type: IV induction and Cricoid Pressure applied Ventilation: Mask ventilation without difficulty Grade View: Grade I Tube type: Oral Tube size: 7.5 mm Number of attempts: 1 Airway Equipment and Method: Stylet Placement Confirmation: ETT inserted through vocal cords under direct vision,  positive ETCO2 and breath sounds checked- equal and bilateral Secured at: 23 cm Tube secured with: Tape Dental Injury: Teeth and Oropharynx as per pre-operative assessment

## 2012-03-11 NOTE — Progress Notes (Signed)
Dr. Council Mechanic  notified of Potassium to draw stat lab

## 2012-03-11 NOTE — H&P (Signed)
CC: Abdominal pain, weight loss, Crohn's disease   Patient ID: Rick Edwards, male DOB: 03-30-1956, 56 y.o. MRN: [id number]  HPI  Patient is a 56 year old male with a history of Crohn's colitis referred by Dr. Madilyn Edwards. I know the patient from 10 years ago when he presented with severe Crohn's colitis involving the right colon with perforation into the retroperitoneum. At that time he underwent right hemicolectomy. He did well from this procedure and was symptom free for a number of years. On recent followup over about the past 2 years he has had progressive and persistent findings on colonoscopy and CT scan. Colonoscopy in 2010 by Dr. Arlyce Edwards showed apparent active Crohn's colitis involving the descending and transverse colon with apparent sparing of the distal colon. This also showed a large pseudopolyp-like mass or possible inspissated stool ball in the transverse colon near the anastomosis. This was confirmed by CT scan. This all started about 2 years ago. He has been followed regularly. He was asymptomatic until recent weeks. For about 4-6 weeks he has had progressive abdominal pain. He describes pressure and cramping like pain across his upper abdomen. This is often worse with eating. He will have marked visual swelling of his right upper quadrant at times associated with the pain. He has chronic diarrhea. He has not had nausea or vomiting. He however has about 20 pound weight loss over the last month due to lack of appetite.  Dr. Madilyn Edwards has followed her CEA levels which has slowly progressively increased from 62 years ago to 7 one year ago to 10 in April of this year to 11.5 this month.  CT was performed about 2 weeks ago which are reviewed. This shows a markedly abnormal dilated transverse colon with a large amount of stool throughout the transverse and proximal left colon. There is a calcified large apparent fecalith in the proximal transverse colon. The small bowel appears normal. There is concern for  stricture of the left colon based on his CT scan.   Past Medical History   Diagnosis  Date   .  Crohn's disease    .  Perianal abscess  2001     s/p right hemicolectomy, and drainage of retroperitoneal abcess 2003   .  Hypertension    .  Hearing loss    .  GI bleed  6/11     w/ normal EGD 6/11, 3 PRBCs   .  Anemia      anemia thrombocytopenia, saw Hematology 2011, can not r/o myeloproliferative d/c    Past Surgical History   Procedure  Date   .  Hemicolectomy  08/29/2001     perforated abscess   .  Anal fistulotomy  2002    Current Outpatient Prescriptions   Medication  Sig  Dispense  Refill   .  amLODipine (NORVASC) 5 MG tablet  Take 1 tablet (5 mg total) by mouth daily.  90 tablet  3   .  azaTHIOprine (IMURAN) 50 MG tablet  Take 25 mg by mouth daily.     Marland Kitchen  spironolactone (ALDACTONE) 25 MG tablet  TAKE 1 TABLET BY MOUTH EVERY DAY  60 tablet  3    Allergies   Allergen  Reactions   .  Mesalamine      REACTION: Rash    Review of Systems  Constitutional: Positive for fatigue and unexpected weight change. Negative for fever and chills.  HENT: Positive for hearing loss.  Respiratory: Negative for cough, choking and shortness of breath.  Cardiovascular: Negative for chest pain and palpitations.  Gastrointestinal: Positive for abdominal pain, diarrhea and abdominal distention. Negative for nausea, vomiting, constipation and blood in stool.  Genitourinary: Negative.  Musculoskeletal: Negative.  Skin: Negative.    Objective:   Physical Exam  General: Thin alert African American male who appears somewhat chronically ill  Skin: Warm and dry no rash or infection  HEENT: Hard of hearing. No palpable masses. Sclera nonicteric. Significant halitosis.  Lymph nodes: No cervical, supraclavicular, or axillary nodes palpable  Lungs: Clear without wheezing or increased work of breathing  Cardiovascular: Regular rate and rhythm. No edema. No JVD.  Abdomen: Long healed midline incision.  There is a small umbilical incisional hernia. There is fullness and tenderness across the upper abdomen particularly in the left upper quadrant where there is suggestion of a mass. Some guarding. Mild distention.  Extremities: No joint swelling or deformity.  Neurologic: He is alert and oriented and conversant. Speech is somewhat slow. Gait normal.   Assessment:    56 year old male with history of Crohn's colitis status post right hemicolectomy. He had progressive abdominal symptoms with pain, weight loss, and markedly dilated stool-filled transverse and proximal left colon consistent with active colitis or possibly stricture. He has some gradually elevated CEA and cannot rule out malignancy. He clearly is not going to get by his current situation and I believe will require exploration and probable subtotal colectomy. Hopefully we can find some relatively normal distal colon for anastomosis. I discussed all this with the patient and his parents. We discussed that this is a major surgery with significant risk of complication such as bleeding or infection or anastomotic leak. We discussed he may well need a temporary ostomy. I don't believe there are any realistic alternatives to surgery at this point. We reviewed all this with an anatomic chart and all their questions were answered.  Plan:    Laparotomy and probable subtotal colectomy with or without an ostomy as described above.

## 2012-03-12 ENCOUNTER — Encounter (HOSPITAL_COMMUNITY): Payer: Self-pay | Admitting: Certified Registered Nurse Anesthetist

## 2012-03-12 ENCOUNTER — Encounter (HOSPITAL_COMMUNITY)
Admission: RE | Disposition: A | Discharge status: Home / Self Care | Payer: Self-pay | Source: Ambulatory Visit | Attending: General Surgery

## 2012-03-12 ENCOUNTER — Inpatient Hospital Stay (HOSPITAL_COMMUNITY): Payer: No Typology Code available for payment source

## 2012-03-12 ENCOUNTER — Inpatient Hospital Stay (HOSPITAL_COMMUNITY): Payer: No Typology Code available for payment source | Admitting: Certified Registered Nurse Anesthetist

## 2012-03-12 DIAGNOSIS — M79609 Pain in unspecified limb: Secondary | ICD-10-CM

## 2012-03-12 DIAGNOSIS — I743 Embolism and thrombosis of arteries of the lower extremities: Secondary | ICD-10-CM

## 2012-03-12 HISTORY — PX: INTRAOPERATIVE ARTERIOGRAM: SHX5157

## 2012-03-12 HISTORY — PX: EMBOLECTOMY: SHX44

## 2012-03-12 LAB — TYPE AND SCREEN
ABO/RH(D): O POS
Antibody Screen: NEGATIVE
Unit division: 0

## 2012-03-12 LAB — CBC
HCT: 36.1 % — ABNORMAL LOW (ref 39.0–52.0)
HCT: 26.5 % — ABNORMAL LOW (ref 39.0–52.0)
Hemoglobin: 12 g/dL — ABNORMAL LOW (ref 13.0–17.0)
Hemoglobin: 8.9 g/dL — ABNORMAL LOW (ref 13.0–17.0)
MCH: 29.8 pg (ref 26.0–34.0)
MCH: 29.8 pg (ref 26.0–34.0)
MCHC: 33.2 g/dL (ref 30.0–36.0)
MCHC: 33.6 g/dL (ref 30.0–36.0)
MCV: 88.6 fL (ref 78.0–100.0)
Platelets: 286 K/uL (ref 150–400)
RBC: 2.99 MIL/uL — ABNORMAL LOW (ref 4.22–5.81)
RDW: 18.2 % — ABNORMAL HIGH (ref 11.5–15.5)
WBC: 21.6 K/uL — ABNORMAL HIGH (ref 4.0–10.5)

## 2012-03-12 LAB — DIFFERENTIAL
Eosinophils Relative: 0 % (ref 0–5)
Lymphs Abs: 0.5 10*3/uL — ABNORMAL LOW (ref 0.7–4.0)
Monocytes Relative: 3 % (ref 3–12)

## 2012-03-12 LAB — COMPREHENSIVE METABOLIC PANEL
Albumin: 1 g/dL — ABNORMAL LOW (ref 3.5–5.2)
Alkaline Phosphatase: 70 U/L (ref 39–117)
BUN: 19 mg/dL (ref 6–23)
Chloride: 112 mEq/L (ref 96–112)
GFR calc Af Amer: 85 mL/min — ABNORMAL LOW (ref >90)
Glucose, Bld: 146 mg/dL — ABNORMAL HIGH (ref 70–99)
Potassium: 5.8 mEq/L — ABNORMAL HIGH (ref 3.5–5.1)
Total Bilirubin: 0.2 mg/dL — ABNORMAL LOW (ref 0.3–1.2)

## 2012-03-12 LAB — POCT I-STAT 4, (NA,K, GLUC, HGB,HCT)
Glucose, Bld: 163 mg/dL — ABNORMAL HIGH (ref 70–99)
HCT: 25 % — ABNORMAL LOW (ref 39.0–52.0)
Hemoglobin: 8.2 g/dL — ABNORMAL LOW (ref 13.0–17.0)
Sodium: 146 mEq/L — ABNORMAL HIGH (ref 135–145)
Sodium: 143 mEq/L (ref 135–145)

## 2012-03-12 LAB — TRIGLYCERIDES: Triglycerides: 93 mg/dL (ref <150)

## 2012-03-12 LAB — GLUCOSE, CAPILLARY: Glucose-Capillary: 108 mg/dL — ABNORMAL HIGH (ref 70–99)

## 2012-03-12 LAB — PREALBUMIN: Prealbumin: 4.3 mg/dL — ABNORMAL LOW (ref 17.0–34.0)

## 2012-03-12 SURGERY — EMBOLECTOMY
Anesthesia: General | Site: Leg Lower | Laterality: Right

## 2012-03-12 MED ORDER — DEXTROSE-NACL 5-0.9 % IV SOLN
INTRAVENOUS | Status: DC | PRN
  Administered 2012-03-12: 100 mL via INTRAVENOUS
  Administered 2012-03-12: 150 mL via INTRAVENOUS
  Administered 2012-03-12: 300 mL via INTRAVENOUS

## 2012-03-12 MED ORDER — 0.9 % SODIUM CHLORIDE (POUR BTL) OPTIME
TOPICAL | Status: DC | PRN
  Administered 2012-03-12: 1000 mL

## 2012-03-12 MED ORDER — PROPOFOL 10 MG/ML IV EMUL
INTRAVENOUS | Status: DC | PRN
  Administered 2012-03-12: 130 mg via INTRAVENOUS

## 2012-03-12 MED ORDER — FENTANYL CITRATE 0.05 MG/ML IJ SOLN
INTRAMUSCULAR | Status: DC | PRN
  Administered 2012-03-12: 50 ug via INTRAVENOUS

## 2012-03-12 MED ORDER — ALBUMIN HUMAN 5 % IV SOLN
INTRAVENOUS | Status: DC | PRN
  Administered 2012-03-12 (×2): via INTRAVENOUS

## 2012-03-12 MED ORDER — HYDROMORPHONE HCL PF 1 MG/ML IJ SOLN
0.2500 mg | INTRAMUSCULAR | Status: DC | PRN
  Administered 2012-03-12: 0.5 mg via INTRAVENOUS

## 2012-03-12 MED ORDER — CLINIMIX/DEXTROSE (5/15) 5 % IV SOLN
INTRAVENOUS | Status: DC
  Filled 2012-03-12: qty 1000

## 2012-03-12 MED ORDER — PROMETHAZINE HCL 25 MG/ML IJ SOLN: 6.2500 mg | INTRAMUSCULAR | Status: DC | PRN

## 2012-03-12 MED ORDER — HEPARIN SODIUM (PORCINE) 1000 UNIT/ML IJ SOLN
INTRAMUSCULAR | Status: DC | PRN
  Administered 2012-03-12: 5000 [IU] via INTRAVENOUS
  Administered 2012-03-12: 2000 [IU] via INTRAVENOUS

## 2012-03-12 MED ORDER — ROCURONIUM BROMIDE 100 MG/10ML IV SOLN
INTRAVENOUS | Status: DC | PRN
  Administered 2012-03-12: 50 mg via INTRAVENOUS

## 2012-03-12 MED ORDER — FENTANYL CITRATE 0.05 MG/ML IJ SOLN: 50.0000 ug | INTRAMUSCULAR | Status: DC | PRN

## 2012-03-12 MED ORDER — HYDROMORPHONE HCL PF 1 MG/ML IJ SOLN: 0.2500 mg | INTRAMUSCULAR | Status: DC | PRN

## 2012-03-12 MED ORDER — IOHEXOL 300 MG/ML  SOLN
INTRAMUSCULAR | Status: AC
  Filled 2012-03-12: qty 1

## 2012-03-12 MED ORDER — SODIUM CHLORIDE 0.9 % IR SOLN
Status: DC | PRN
  Administered 2012-03-12: 14:00:00

## 2012-03-12 MED ORDER — LIDOCAINE HCL (CARDIAC) 20 MG/ML IV SOLN
INTRAVENOUS | Status: DC | PRN
  Administered 2012-03-12: 40 mg via INTRAVENOUS

## 2012-03-12 MED ORDER — IOHEXOL 300 MG/ML  SOLN
INTRAMUSCULAR | Status: DC | PRN
  Administered 2012-03-12: 15 mL via INTRAVENOUS

## 2012-03-12 MED ORDER — EPHEDRINE SULFATE 50 MG/ML IJ SOLN
INTRAMUSCULAR | Status: DC | PRN
  Administered 2012-03-12: 10 mg via INTRAVENOUS

## 2012-03-12 MED ORDER — SODIUM CHLORIDE 0.9 % IV SOLN
INTRAVENOUS | Status: DC | PRN
  Administered 2012-03-12 (×3): via INTRAVENOUS

## 2012-03-12 MED ORDER — PHENYLEPHRINE HCL 10 MG/ML IJ SOLN
10.0000 mg | INTRAVENOUS | Status: DC | PRN
  Administered 2012-03-12: 20 ug/min via INTRAVENOUS

## 2012-03-12 MED ORDER — INSULIN ASPART 100 UNIT/ML ~~LOC~~ SOLN
0.0000 [IU] | Freq: Four times a day (QID) | SUBCUTANEOUS | Status: DC
  Administered 2012-03-13 – 2012-03-18 (×6): 1 [IU] via SUBCUTANEOUS

## 2012-03-12 MED ORDER — MIDAZOLAM HCL 5 MG/5ML IJ SOLN
INTRAMUSCULAR | Status: DC | PRN
  Administered 2012-03-12: 2 mg via INTRAVENOUS

## 2012-03-12 MED ORDER — HYDROMORPHONE HCL PF 1 MG/ML IJ SOLN
INTRAMUSCULAR | Status: AC
  Filled 2012-03-12: qty 1

## 2012-03-12 MED ORDER — SODIUM BICARBONATE 4.2 % IV SOLN
INTRAVENOUS | Status: DC | PRN
  Administered 2012-03-12: 50 meq via INTRAVENOUS

## 2012-03-12 MED ORDER — HEPARIN (PORCINE) IN NACL 100-0.45 UNIT/ML-% IJ SOLN
900.0000 [IU]/h | INTRAMUSCULAR | Status: DC
  Administered 2012-03-12: 400 [IU]/h via INTRAVENOUS
  Administered 2012-03-13: 900 [IU]/h via INTRAVENOUS
  Filled 2012-03-12 (×2): qty 250

## 2012-03-12 MED ORDER — SODIUM CHLORIDE 0.9 % IV BOLUS (SEPSIS)
1000.0000 mL | Freq: Once | INTRAVENOUS | Status: AC
  Administered 2012-03-12: 1000 mL via INTRAVENOUS

## 2012-03-12 MED ORDER — MIDAZOLAM HCL 2 MG/2ML IJ SOLN: 1.0000 mg | INTRAMUSCULAR | Status: DC | PRN

## 2012-03-12 MED ORDER — DEXTROSE-NACL 5-0.9 % IV SOLN
INTRAVENOUS | Status: DC
  Administered 2012-03-12: 85 mL/h via INTRAVENOUS

## 2012-03-12 MED ORDER — HEPARIN (PORCINE) IN NACL 100-0.45 UNIT/ML-% IJ SOLN
400.0000 [IU]/h | INTRAMUSCULAR | Status: DC
  Administered 2012-03-12: 400 [IU]/h via INTRAVENOUS
  Filled 2012-03-12: qty 250

## 2012-03-12 SURGICAL SUPPLY — 64 items
ADH SKN CLS APL DERMABOND .7 (GAUZE/BANDAGES/DRESSINGS) ×2
BANDAGE ELASTIC 4 VELCRO ST LF (GAUZE/BANDAGES/DRESSINGS) ×2 IMPLANT
BANDAGE ESMARK 6X9 LF (GAUZE/BANDAGES/DRESSINGS) IMPLANT
BIOPATCH RED 1 DISK 7.0 (GAUZE/BANDAGES/DRESSINGS) ×1 IMPLANT
BNDG CMPR 9X6 STRL LF SNTH (GAUZE/BANDAGES/DRESSINGS)
BNDG ESMARK 6X9 LF (GAUZE/BANDAGES/DRESSINGS)
CANISTER SUCTION 2500CC (MISCELLANEOUS) ×3 IMPLANT
CATH EMB 2FR 60CM (CATHETERS) ×2 IMPLANT
CATH EMB 3FR 80CM (CATHETERS) ×1 IMPLANT
CATH EMB 4FR 80CM (CATHETERS) ×2 IMPLANT
CATH EMB 5FR 80CM (CATHETERS) IMPLANT
CLIP TI MEDIUM 24 (CLIP) ×3 IMPLANT
CLIP TI WIDE RED SMALL 24 (CLIP) ×3 IMPLANT
CLOTH BEACON ORANGE TIMEOUT ST (SAFETY) ×3 IMPLANT
COVER SURGICAL LIGHT HANDLE (MISCELLANEOUS) ×3 IMPLANT
CUFF TOURNIQUET SINGLE 18IN (TOURNIQUET CUFF) IMPLANT
CUFF TOURNIQUET SINGLE 24IN (TOURNIQUET CUFF) IMPLANT
CUFF TOURNIQUET SINGLE 34IN LL (TOURNIQUET CUFF) IMPLANT
CUFF TOURNIQUET SINGLE 44IN (TOURNIQUET CUFF) IMPLANT
DERMABOND ADVANCED (GAUZE/BANDAGES/DRESSINGS) ×1
DERMABOND ADVANCED .7 DNX12 (GAUZE/BANDAGES/DRESSINGS) ×2 IMPLANT
DRAIN CHANNEL 15F RND FF W/TCR (WOUND CARE) ×2 IMPLANT
DRAPE WARM FLUID 44X44 (DRAPE) ×3 IMPLANT
DRAPE X-RAY CASS 24X20 (DRAPES) ×1 IMPLANT
ELECT REM PT RETURN 9FT ADLT (ELECTROSURGICAL) ×3
ELECTRODE REM PT RTRN 9FT ADLT (ELECTROSURGICAL) ×2 IMPLANT
EVACUATOR SILICONE 100CC (DRAIN) ×2 IMPLANT
GLOVE BIO SURGEON STRL SZ 6.5 (GLOVE) ×4 IMPLANT
GLOVE BIO SURGEON STRL SZ7.5 (GLOVE) ×3 IMPLANT
GLOVE BIOGEL PI IND STRL 6.5 (GLOVE) ×2 IMPLANT
GLOVE BIOGEL PI IND STRL 7.0 (GLOVE) ×1 IMPLANT
GLOVE BIOGEL PI IND STRL 7.5 (GLOVE) ×2 IMPLANT
GLOVE BIOGEL PI INDICATOR 6.5 (GLOVE) ×2
GLOVE BIOGEL PI INDICATOR 7.0 (GLOVE) ×1
GLOVE BIOGEL PI INDICATOR 7.5 (GLOVE) ×1
GOWN PREVENTION PLUS XLARGE (GOWN DISPOSABLE) ×4 IMPLANT
GOWN STRL NON-REIN LRG LVL3 (GOWN DISPOSABLE) ×7 IMPLANT
KIT BASIN OR (CUSTOM PROCEDURE TRAY) ×3 IMPLANT
KIT ROOM TURNOVER OR (KITS) ×3 IMPLANT
NS IRRIG 1000ML POUR BTL (IV SOLUTION) ×6 IMPLANT
PACK PERIPHERAL VASCULAR (CUSTOM PROCEDURE TRAY) ×3 IMPLANT
PAD ARMBOARD 7.5X6 YLW CONV (MISCELLANEOUS) ×6 IMPLANT
SET COLLECT BLD 21X3/4 12 (NEEDLE) ×2 IMPLANT
SPONGE GAUZE 4X4 12PLY (GAUZE/BANDAGES/DRESSINGS) ×2 IMPLANT
SPONGE SURGIFOAM ABS GEL 100 (HEMOSTASIS) IMPLANT
STAPLER VISISTAT 35W (STAPLE) IMPLANT
STOPCOCK 4 WAY LG BORE MALE ST (IV SETS) ×1 IMPLANT
SUT ETHILON 3 0 PS 1 (SUTURE) ×2 IMPLANT
SUT PROLENE 5 0 C 1 24 (SUTURE) ×3 IMPLANT
SUT PROLENE 6 0 BV (SUTURE) ×4 IMPLANT
SUT SILK 3 0 (SUTURE) ×3
SUT SILK 3-0 18XBRD TIE 12 (SUTURE) ×1 IMPLANT
SUT VIC AB 2-0 CTB1 (SUTURE) ×4 IMPLANT
SUT VIC AB 3-0 SH 27 (SUTURE) ×3
SUT VIC AB 3-0 SH 27X BRD (SUTURE) ×2 IMPLANT
SUT VICRYL 4-0 PS2 18IN ABS (SUTURE) ×3 IMPLANT
SYR 3ML LL SCALE MARK (SYRINGE) ×3 IMPLANT
SYR TB 1ML LUER SLIP (SYRINGE) ×1 IMPLANT
TOWEL OR 17X24 6PK STRL BLUE (TOWEL DISPOSABLE) ×8 IMPLANT
TOWEL OR 17X26 10 PK STRL BLUE (TOWEL DISPOSABLE) ×5 IMPLANT
TRAY FOLEY CATH 14FRSI W/METER (CATHETERS) ×1 IMPLANT
TUBING EXTENTION W/L.L. (IV SETS) ×2 IMPLANT
UNDERPAD 30X30 INCONTINENT (UNDERPADS AND DIAPERS) ×3 IMPLANT
WATER STERILE IRR 1000ML POUR (IV SOLUTION) ×3 IMPLANT

## 2012-03-12 NOTE — Transfer of Care (Signed)
Immediate Anesthesia Transfer of Care Note  Patient: Rick Edwards  Procedure(s) Performed: Procedure(s) (LRB): EMBOLECTOMY (Right) INTRA OPERATIVE ARTERIOGRAM (Right)  Patient Location: PACU  Anesthesia Type: General  Level of Consciousness: sedated  Airway & Oxygen Therapy: Patient Spontanous Breathing and Patient connected to nasal cannula oxygen  Post-op Assessment: Report given to PACU RN and Post -op Vital signs reviewed and stable  Post vital signs: Reviewed and stable  Complications: No apparent anesthesia complications

## 2012-03-12 NOTE — Progress Notes (Signed)
Pharmacy TNA Addendum  Note that patient transferred from [facility] to ED for surgery with plans to stay at [facility].  Pharmacy aware of TNA orders however, patient is still in the OR and does not currently have a PICC.  Plan - No TNA today - follow for PICC placement - ideally start TNA tomorrow if/when PICC placed.  Adaleigh Warf L. Illene Bolus, PharmD, BCPS Clinical Pharmacist Pager: [phone] Pharmacy: [phone] 03/12/2012 2:28 PM

## 2012-03-12 NOTE — Progress Notes (Signed)
Patient ID: Rick Edwards, male   DOB: 1956/02/22, 56 y.o.   MRN: [id number] 1 Day Post-Op  Subjective: C/O right foot pain, toes and dorsum.  Some incisional pain not severe  Objective: Vital signs in last 24 hours: Temp:  [97.4 F (36.3 C)-98.6 F (37 C)] 98.2 F (36.8 C) (08/06 0400) Pulse Rate:  [92-106] 101  (08/06 0420) Resp:  [10-26] 26  (08/06 0420) BP: (104-142)/(77-94) 133/85 mmHg (08/06 0420) SpO2:  [94 %-100 %] 99 % (08/06 0420) FiO2 (%):  [2 %] 2 % (08/06 0420) Weight:  [138 lb 3.7 oz (62.7 kg)] 138 lb 3.7 oz (62.7 kg) (08/06 0000) Last BM Date:  (colostomy)  Intake/Output from previous day: 08/05 0701 - 08/06 0700 In: 82956 [I.V.:9925; Blood:700; NG/GT:60; IV Piggyback:50] Out: 1932 [Urine:532; Emesis/NG output:100; Stool:150; Blood:300] Intake/Output this shift:    General appearance: alert and no distress Resp: clear to auscultation bilaterally GI: abnormal findings:  mild tenderness in the entire abdomen Extremities: no edema, redness or tenderness in the calves or thighs and right foot feels somewhat cool.  I cannot feel a DP pulse on the right but I can on the left. Normal femoral pulses  No swelling. Somewhat tender.  Normal motor exam  Lab Results:   Basename 03/12/12 0325 03/11/12 1556  WBC 23.4* 8.0  HGB 12.0* 13.8  HCT 36.1* 40.4  PLT 403* 653*   BMET  Basename 03/12/12 0325 03/11/12 1556  NA 138 136  K 5.8* 4.5  CL 112 107  CO2 19 20  GLUCOSE 146* 95  BUN 19 16  CREATININE 1.12 0.76  CALCIUM 6.5* 6.4*     Studies/Results: No results found.  Anti-infectives: Anti-infectives     Start     Dose/Rate Route Frequency Ordered Stop   03/11/12 1930   ertapenem [facility]) 1 g in sodium chloride 0.9 % 50 mL IVPB        1 g 100 mL/hr over 30 Minutes Intravenous Every 24 hours 03/11/12 1835     03/11/12 0809   ertapenem (INVANZ) 1 g in sodium chloride 0.9 % 50 mL IVPB        1 g 100 mL/hr over 30 Minutes Intravenous 60 min pre-op 03/11/12  0809 03/11/12 1100          Assessment/Plan: s/p Procedure(s): PARTIAL COLECTOMY Overall stable Hyperkalemia-remove KCL from IVF FEN-Start TNA Right foot pain, ? Vascular.  Have ordered ABI and will ask vascular to see   LOS: 1 day    Abygayle Deltoro T 03/12/2012

## 2012-03-12 NOTE — Op Note (Signed)
NAME: BOGDAN VIVONA   MRN: [id number] DOB: 05-Oct-1955    DATE OF OPERATION: 03/12/2012  PREOP DIAGNOSIS: ischemic right foot  POSTOP DIAGNOSIS: same  PROCEDURE:  1. Right popliteal and tibial peroneal trunk embolectomy with vein patch angioplasty 2. Intraoperative arteriogram 3. Right tibial peroneal trunk and posterior tibial artery embolectomy with vein patch angioplasty  SURGEON: Di Kindle. Edilia Bo, MD, FACS  ASSIST: Doreatha Massed, RNFA  ANESTHESIA: Gen.   EBL: 200 cc  INDICATIONS: CASTOR GITTLEMAN is a 56 y.o. male who underwent a colectomy yesterday for Crohn's disease. He was noted to have an ischemic right foot this morning and is brought to the operating urgently for thrombectomy. The etiology of this ischemic right foot was not clear. The procedure and risks were discussed with the patient and his family.  FINDINGS: a moderate amount of white fibrinous material was thrombectomized from the superficial femoral artery above and from the tibial vessels below. This was sent for pathology. It did not appear to be classic thrombus. There was evidence of atherosclerotic plaque in the tibial peroneal trunk and the posterior tibial artery distally.  TECHNIQUE: The patient was brought to the operating room and received a general anesthetic. The right lower extremity was prepped and draped in the usual sterile fashion. A longitudinal incision was made along the medial aspect of the right leg and through this incision the below knee popliteal artery was exposed. I then divided the soleus muscle and the anterior tibial vein was divided allowing exposure of the proximal anterior tibial artery which was controlled with a vessel loop. The tibial peroneal trunk, peroneal artery, and posterior tibial artery were controlled with vessel loops. The patient was then heparinized. A longitudinal arteriotomy was made in the below-knee pop artery and this was extended onto the tibial peroneal trunk. A  #3 and 4 Fogarty catheter were passed proximally multiple times a moderate amount of white fibrous material was retrieved. Ultimately excellent inflow was established and the artery was flushed with heparinized saline and clamped. The 3 Fogarty catheter was then directed down the anterior tibial artery which appeared to be chronically diseased. No significant clot was retrieved from the anterior tibial artery although again this artery appeared to be chronically occluded. The catheter was then directed down the posterior tibial artery and a moderate amount of this fibrinous material was retrieved. A segment of saphenous vein was harvested with branches divided between clips and 3-0 silk ties. The vein was opened longitudinally and then sewn as a vein patch to close the arteriotomy which began in the below knee popliteal artery and extended into the tibial peroneal trunk. Once the anastomosis was completed an intraoperative arteriogram was obtained which showed poor visualization into the distal posterior tibial artery. I therefore elected to explore the posterior tibial artery.  I exposed the posterior tibial artery lower down and made a separate arteriotomy in the distal tibial peroneal trunk which will allow me to direct the catheter directly into the peroneal artery which came off and a fairly abrupt angle. I was able to pass a 3 Fogarty catheter proximally 30 cm and some fibrous clot was retrieved. I then directed the 3 Fogarty catheter multiple times on the posterior tibial artery and retrieved some what appeared to be intima or plaque area and in addition was a small plaque in the tibial peroneal trunk which I endarterectomized. I was able to get the 2 Fogarty catheter 60 cm down into the foot. Was no further clot or intima  was retrieved the artery was flushed with heparinized saline. Separate vein patch was opened longitudinally and sewn with continuous 6-0 Prolene suture. Prior to completing this  anastomosis the arteries were backbled and flushed. There was excellent inflow and good backbleeding. Anastomosis was completed. At this point it was a posterior tibial and peroneal signal with the Doppler and anterior tibial signal in the mid leg. Hemostasis was obtained in the wound and a 15 Blake drain was placed. The wound closed the deep layer 2-0 Vicryl and the skin closed with 4-0 subcuticular stitch. I did assess the calf muscles at this point and they were soft and I did not think fasciotomy was indicated.  Dermabond was applied. Sterile dressing was applied. Patient tolerated the procedure well was transferred to recovery in stable condition. All needle and sponge counts were correct.  Waverly Ferrari, MD, FACS [facility]  DATE OF DICTATION:   03/12/2012

## 2012-03-12 NOTE — Progress Notes (Signed)
PARENTERAL NUTRITION CONSULT NOTE - INITIAL  Pharmacy Consult for TNA Indication: Severe pre-op malnutrition and expected post op ileus  Allergies  Allergen Reactions  . Mesalamine     REACTION: Rash    Patient Measurements: Height: 5\' 9"  (175.3 cm) Weight: 138 lb 3.7 oz (62.7 kg) IBW/kg (Calculated) : 70.7  Usual Weight: ~70 kg  Vital Signs: Temp: 98.2 F (36.8 C) (08/06 0400) Temp src: Oral (08/06 0400) BP: 133/85 mmHg (08/06 0420) Pulse Rate: 101  (08/06 0420) Intake/Output from previous day: 08/05 0701 - 08/06 0700 In: 16109 [I.V.:9925; Blood:700; NG/GT:60; IV Piggyback:50] Out: 1932 [Urine:532; Emesis/NG output:100; Stool:150; Blood:300] Intake/Output from this shift:    Labs:  [facility] 03/12/12 0325 03/11/12 1556  WBC 23.4* 8.0  HGB 12.0* 13.8  HCT 36.1* 40.4  PLT 403* 653*  APTT -- --  INR -- --     Basename 03/12/12 0325 03/11/12 1556 03/11/12 0857  NA 138 136 --  K 5.8* 4.5 5.3*  CL 112 107 --  CO2 19 20 --  GLUCOSE 146* 95 --  BUN 19 16 --  CREATININE 1.12 0.76 --  LABCREA -- -- --  CREAT24HRUR -- -- --  CALCIUM 6.5* 6.4* --  MG 1.7 -- --  PHOS 5.1* -- --  PROT 4.0* -- --  ALBUMIN 1.0* -- --  AST 10 -- --  ALT 5 -- --  ALKPHOS 70 -- --  BILITOT 0.2* -- --  BILIDIR -- -- --  IBILI -- -- --  PREALBUMIN -- -- --  TRIG 93 -- --  CHOLHDL -- -- --  CHOL 79 -- --   Estimated Creatinine Clearance: 67.6 ml/min (by C-G formula based on Cr of 1.12).   No results found for this basename: GLUCAP:3 in the last 72 hours  Medical History: Past Medical History  Diagnosis Date  . Crohn's disease   . Perianal abscess 2001    s/p right hemicolectomy, and drainage of retroperitoneal abcess 2003  . Hypertension   . Hearing loss   . GI bleed 6/11    w/ normal EGD 6/11, 3 PRBCs   . Anemia     anemia thrombocytopenia, saw Hematology 2011, can not r/o myeloproliferative d/c   CBGs & Insulin requirements past 24 hours:  CBGs <150 on BMET, no active  insulin order  Nutritional Goals: RD recommendation: pending Clinimix 5/20 at 85ml/hr and IVFE 20% at 10 ml/hr on MWF = 96gm protein (1.5 g/kg/d), 2169 kCal on MWF, 1689 Kcal on non-lipid days and 1896kcal weekly avg (30kcal/kg/d)  Current: Diet: NPO with NGT IVF: D5NS @ 125 ml/hr  Assessment:  69 YOM with severe Crohn's colitis of the right colon with perforation into the retroperitoneum and retroperitoneal abscess status post right hemicolectomy with anastomosis 10 years ago. Developed increased postprandial pain with 20 pound weight loss over the past month due to lack of appetite. CT shows massively dilated transverse colon with thickening and inflammatory change extending over to the left colon. He is now s/p partial colectomy with end ileostomy and repair of enterotomy on 03/11/2012.  PICC line ordered 8/5. TNA to start 8/6 given severe pre-op malnutrition and expected post op ileus.    Renal/hepatic function: wnl    Electrolytes:  K elevated at 5.8 (MD removed KCL from IVF this AM), phos elevated at 5.1  corrected cal 8.9 (wnl)  Pre-Albumin: pending 8/6  TG/Cholesterol: wnl 8/6  Other: WBC bump to 23.4, afebrile, on ertapenem   Plan:  At 1800 tonight -  after PICC placement,  Start Clinimix 5/15 (only formulation without lytes) at 40 ml/hr  MD removed KCl from IVF this AM, will reduce rate to 85 ml/hr with TNA start.   TNA to contain IV fat emulsion, standard multivitamins and trace elements only on MWF only due to ongoing shortage  Add sensitive SSI q6h  TNA labs Monday/Thursdays  Dietician consult (ordered 8/5)  F/u lytes, adjust TNA formulation appropriately  Geoffry Paradise, PharmD, BCPS Pager: [phone] 7:20 AM

## 2012-03-12 NOTE — Anesthesia Preprocedure Evaluation (Addendum)
Anesthesia Evaluation  Patient identified by MRN, date of birth, ID band Patient awake    Reviewed: Allergy & Precautions, H&P , NPO status , Patient's Chart, lab work & pertinent test results, reviewed documented beta blocker date and time   History of Anesthesia Complications Negative for: history of anesthetic complications  Airway Mallampati: I TM Distance: >3 FB Neck ROM: Full    Dental  (+) Loose, Poor Dentition and Dental Advisory Given   Pulmonary neg pulmonary ROS,  breath sounds clear to auscultation  Pulmonary exam normal       Cardiovascular hypertension, Pt. on medications Rhythm:Regular Rate:Normal     Neuro/Psych Hard of hearing negative neurological ROS  negative psych ROS   GI/Hepatic Colectomy 03/11/12   Endo/Other    Renal/GU      Musculoskeletal   Abdominal (+)  Abdomen: tender.    Peds  Hematology   Anesthesia Other Findings Ostomy present, pink, healthy appearing. Abdomen soft. Recent laparotomy dressing in place.  Reproductive/Obstetrics                         Anesthesia Physical Anesthesia Plan  ASA: III  Anesthesia Plan: General   Post-op Pain Management:    Induction: Intravenous  Airway Management Planned: Oral ETT  Additional Equipment:   Intra-op Plan:   Post-operative Plan: Extubation in OR  Informed Consent: I have reviewed the patients History and Physical, chart, labs and discussed the procedure including the risks, benefits and alternatives for the proposed anesthesia with the patient or authorized representative who has indicated his/her understanding and acceptance.   Dental advisory given  Plan Discussed with: CRNA and Surgeon  Anesthesia Plan Comments: (Pt seen, examined, risks, benefits and procedures explained. Pt accepts. K+ 5.8 this AM. No repeat sent. Will repeat in preop. GETA for thromectomy. Repeat K 6. )       Anesthesia  Quick Evaluation

## 2012-03-12 NOTE — Progress Notes (Signed)
INITIAL ADULT NUTRITION ASSESSMENT Date: 03/12/2012   Time: 12:09 PM Reason for Assessment: Consult fir new TNA  ASSESSMENT: Male 56 y.o.  Dx: Abdominal pain, weight loss, Chron's Disease  Hx:  Past Medical History  Diagnosis Date  . Crohn's disease   . Perianal abscess 2001    s/p right hemicolectomy, and drainage of retroperitoneal abcess 2003  . Hypertension   . Hearing loss   . GI bleed 6/11    w/ normal EGD 6/11, 3 PRBCs   . Anemia     anemia thrombocytopenia, saw Hematology 2011, can not r/o myeloproliferative d/c    Related Meds:  Scheduled Meds:   . antiseptic oral rinse  15 mL Mouth Rinse q12n4p  . chlorhexidine  15 mL Mouth Rinse BID  . dextrose 5 % and 0.9 % NaCl with KCl 20 mEq/L      . ertapenem (INVANZ) IV  1 g Intravenous Q24H  . heparin  5,000 Units Subcutaneous Q8H  . HYDROmorphone      . HYDROmorphone      . HYDROmorphone      . insulin aspart  0-9 Units Subcutaneous Q6H  . sodium chloride  1,000 mL Intravenous Once  . sodium chloride  1,000 mL Intravenous Once   Continuous Infusions:   . dextrose 5 % and 0.9% NaCl 85 mL/hr (03/12/12 0854)  . TPN (CLINIMIX) +/- additives    . DISCONTD: sodium chloride    . DISCONTD: dextrose 5 % and 0.9 % NaCl with KCl 20 mEq/L 125 mL/hr at 03/11/12 1656  . DISCONTD: dextrose 5 % and 0.9 % NaCl with KCl 20 mEq/L 150 mL/hr at 03/12/12 0400  . DISCONTD: lactated ringers     PRN Meds:.HYDROmorphone (DILAUDID) injection, ondansetron (ZOFRAN) IV, ondansetron, DISCONTD: 0.9 % irrigation (POUR BTL), DISCONTD:  HYDROmorphone (DILAUDID) injection, DISCONTD: promethazine   Ht: 5\' 9"  (175.3 cm)  Wt: 138 lb 3.7 oz (62.7 kg)  Ideal Wt: 73 kg % Ideal Wt: 86.25% Wt Readings from Last 10 Encounters:  03/12/12 138 lb 3.7 oz (62.7 kg)  03/12/12 138 lb 3.7 oz (62.7 kg)  03/12/12 138 lb 3.7 oz (62.7 kg)  03/01/12 132 lb (59.875 kg)  02/06/12 142 lb 3.2 oz (64.501 kg)  10/17/11 162 lb 6.4 oz (73.664 kg)  08/29/11 164 lb  (74.39 kg)  07/19/11 164 lb 8 oz (74.617 kg)  05/29/11 163 lb 9.6 oz (74.208 kg)  01/31/11 166 lb (75.297 kg)  *Weight has been trending down. Unintentional weight loss of 26 lb over 6 months, 15.8% from baseline.   Body mass index is 20.41 kg/(m^2). (WNL)  Food/Nutrition Related Hx: Patient currently NPO. Discussed patient in rounds, plan to start TNA. Patient reported his appetite and intake have been ok. Noted elevated K and Phos.   Labs:  CMP     Component Value Date/Time   NA 138 03/12/2012 0325   K 5.8* 03/12/2012 0325   CL 112 03/12/2012 0325   CO2 19 03/12/2012 0325   GLUCOSE 146* 03/12/2012 0325   GLUCOSE 101 02/26/2008 1535   BUN 19 03/12/2012 0325   CREATININE 1.12 03/12/2012 0325   CALCIUM 6.5* 03/12/2012 0325   PROT 4.0* 03/12/2012 0325   ALBUMIN 1.0* 03/12/2012 0325   AST 10 03/12/2012 0325   ALT 5 03/12/2012 0325   ALKPHOS 70 03/12/2012 0325   BILITOT 0.2* 03/12/2012 0325   GFRNONAA 73* 03/12/2012 0325   GFRAA 85* 03/12/2012 0325    Intake/Output Summary (Last 24 hours) at 03/12/12  1216 Last data filed at 03/12/12 1100  Gross per 24 hour  Intake   9440 ml  Output   1042 ml  Net   8398 ml      Diet Order: NPO  Supplements/Tube Feeding: none at this time  IVF:    dextrose 5 % and 0.9% NaCl Last Rate: 85 mL/hr (03/12/12 0854)  TPN (CLINIMIX) +/- additives   DISCONTD: sodium chloride   DISCONTD: dextrose 5 % and 0.9 % NaCl with KCl 20 mEq/L Last Rate: 125 mL/hr at 03/11/12 1656  DISCONTD: dextrose 5 % and 0.9 % NaCl with KCl 20 mEq/L Last Rate: 150 mL/hr at 03/12/12 0400  DISCONTD: lactated ringers     Estimated Nutritional Needs:   Kcal: 1610-9604 Protein: 75-94 Fluid: 1 ml per kcal intake  NUTRITION DIAGNOSIS: -Inadequate oral intake (NI-2.1).  Status: Ongoing  RELATED TO: inability to eat  AS EVIDENCE BY: NPO status  MONITORING/EVALUATION(Goals): TNA per pharmacy, diet advancements/ tolerance, weights, labs, I/O's 1. TNA per pharmacy, meet > 90% of estimated energy  needs.  2. Diet advancement as medically able.  3. Prevent further weight loss.   EDUCATION NEEDS: -No education needs identified at this time  INTERVENTION: 1. TNA per pharmacy.  2. RD to follow for nutrition plan of care.   Dietitian [phone]  DOCUMENTATION CODES Per approved criteria  -Severe malnutrition in the context of acute illness or injury  *Patient meets criteria for severe malnutrition of acute illness due to unintentional weight loss from baseline of 15.8% of 6 months and apparent moderate loss of body fat. Patient oral intake likely less than 50% of estimated energy needs for > 5 days.   Iven Finn [facility] 03/12/2012, 12:09 PM

## 2012-03-12 NOTE — Preoperative (Signed)
Beta Blockers   Reason not to administer Beta Blockers:Not Applicable 

## 2012-03-12 NOTE — Progress Notes (Signed)
No audible pulses in Rt foot . Rt foot is cooler than Lt foot and is very painful.   Audible popliteal pulse with doppler on Rt.   Palpable pulses on lt foot. Dr. Johna Sheriff aware.  Rick Edwards

## 2012-03-12 NOTE — Progress Notes (Signed)
CARE MANAGEMENT NOTE 03/12/2012  Patient:  Rick Edwards, Rick Edwards   Account Number:  [id number]  Date Initiated:  03/12/2012  Documentation initiated by:  DAVIS,RHONDA  Subjective/Objective Assessment:   subtotal colectomy pm [id number] of cardiac problems, abnormal doppler of the left foot and c/o pain, abd inscision left open with packing     Action/Plan:   lives at home, has a very malnourished status, 20 pound wt loss.   Anticipated DC Date:  03/13/2012   Anticipated DC Plan:  HOME/SELF CARE  In-house referral  NA      DC Planning Services  NA      [facility] Choice  NA   Choice offered to / List presented to:  NA   DME arranged  NA      DME agency  NA     HH arranged  NA      HH agency  NA   Status of service:  In process, will continue to follow Medicare Important Message given?  NA - LOS <3 / Initial given by admissions (If response is "NO", the following Medicare IM given date fields will be blank) Date Medicare IM given:   Date Additional Medicare IM given:    Discharge Disposition:    Per UR Regulation:  Reviewed for med. necessity/level of care/duration of stay  If discussed at Long Length of Stay Meetings, dates discussed:    Comments:  [id number] Earlene Plater, RN, BSN, CCM: CHART REVIEWED AND UPDATED. NO DISCHARGE NEEDS PRESENT AT THIS TIME. CASE MANAGEMENT [phone]

## 2012-03-12 NOTE — Consult Note (Signed)
[facility] [place]  Patient name: Rick Edwards MRN: [id number] DOB: August 30, 1955 Sex: male  REASON FOR CONSULT: Ischemic right foot  HPI: Rick Edwards is a 56 y.o. male with a long history of Crohn's colitis. He had presented with Crohn's colitis with obstruction and yesterday underwent subtotal colectomy with end ileostomy. He states that last night after his SCD's had been placed, he began having some pain in the right foot. This morning it was noted that he had significant pain in the right foot with paresthesias and decreased motor function. No Doppler flow could be detected in the right foot. Vascular surgery was consulted.  Prior to this admission, the patient denies any history of claudication rest pain or nonhealing ulcers. He does state that he ambulated a fair amount and was fairly active without any leg pain. In addition, he denies any history of atrial fibrillation, history of myocardial infarction or history of other arrhythmias. He did have a previous CT scan of the abdomen and pelvis which I reviewed and showed no evidence of aneurysmal disease of his aorta for iliac arteries. I spoke with Dr. Odie Sera who palpated his iliac arteries at the time of surgery yesterday and did not note any significant plaque.  The patient continues to have paresthesias in the right foot and right foot pain. There is been no significant change in his symptoms.  Past Medical History  Diagnosis Date  . Crohn's disease   . Perianal abscess 2001    s/p right hemicolectomy, and drainage of retroperitoneal abcess 2003  . Hypertension   . Hearing loss   . GI bleed 6/11    w/ normal EGD 6/11, 3 PRBCs   . Anemia     anemia thrombocytopenia, saw Hematology 2011, can not r/o myeloproliferative d/c    Family History  Problem Relation Age of Onset  . Coronary artery disease Neg Hx   . Diabetes Neg Hx   . Colon cancer Neg Hx   . Prostate cancer Neg Hx   . Stroke      GF,  aunts   . Hypertension      "the whole family"  . Hypertension Mother   . Hyperlipidemia Mother   . Hypertension Father   . Hyperlipidemia Father   . Heart disease Father    There is no family history of premature cardiovascular disease.  SOCIAL HISTORY: History  Substance Use Topics  . Smoking status: Former Smoker -- 0.2 packs/day for 9 years    Types: Cigarettes    Quit date: 05/09/2011  . Smokeless tobacco: Never Used  . Alcohol Use: No   He is single. He does not have any children. He quit tobacco one year ago.  Allergies  Allergen Reactions  . Mesalamine     REACTION: Rash   REVIEW OF SYSTEMS: Arly.Keller ] denotes positive finding; [  ] denotes negative finding GENERAL: He has had weight loss. CARDIOVASCULAR:  [ ]  chest pain   [ ]  chest pressure   [ ]  palpitations   [ ]  orthopnea   [ ]  dyspnea on exertion   [ ]  claudication   [ ]  rest pain   [ ]  DVT   [ ]  phlebitis PULMONARY:   [ ]  productive cough   [ ]  asthma   [ ]  wheezing NEUROLOGIC:   [ ]  weakness  [ ]  paresthesias  [ ]  aphasia  [ ]  amaurosis  [ ]  dizziness HEMATOLOGIC:   [ ]  bleeding problems   [ ]   clotting disorders MUSCULOSKELETAL:  [ ]  joint pain   [ ]  joint swelling [ ]  leg swelling GASTROINTESTINAL: [ ]   blood in stool  [ ]   Hematemesis. He has had diarrhea. GENITOURINARY:  [ ]   dysuria  [ ]   hematuria PSYCHIATRIC:  [ ]  history of major depression INTEGUMENTARY:  [ ]  rashes  [ ]  ulcers CONSTITUTIONAL:  [ ]  fever   [ ]  chills  PHYSICAL EXAM: Filed Vitals:   03/12/12 0000 03/12/12 0400 03/12/12 0420 03/12/12 0800  BP: 131/85  133/85 136/87  Pulse: 98  101 99  Temp: 98 F (36.7 C) 98.2 F (36.8 C)  98 F (36.7 C)  TempSrc: Oral Oral  Oral  Resp: 16  26 25   Height:      Weight: 138 lb 3.7 oz (62.7 kg)     SpO2: 99%  99% 99%   Body mass index is 20.41 kg/(m^2). GENERAL: The patient is a well-nourished male, in no acute distress. The vital signs are documented above. CARDIOVASCULAR: There is a regular rate  and rhythm without significant murmur appreciated. I do not detect carotid bruits. He has palpable femoral pulses and palpable popliteal pulses bilaterally. He has a palpable left dorsalis pedis pulse. He has no significant lower extremity swelling. The right foot is cool compared to the left. PULMONARY: There is good air exchange bilaterally without wheezing or rales. ABDOMEN: Dressing in place. Tender.  MUSCULOSKELETAL: There are no major deformities. NEUROLOGIC: he has decreased motor and sensory function of the right foot. SKIN: There are no ulcers or rashes noted. PSYCHIATRIC: The patient has a normal affect.  DATA:  Lab Results  Component Value Date   WBC 23.4* 03/12/2012   HGB 12.0* 03/12/2012   HCT 36.1* 03/12/2012   MCV 89.6 03/12/2012   PLT 403* 03/12/2012   Lab Results  Component Value Date   NA 138 03/12/2012   K 5.8* 03/12/2012   CL 112 03/12/2012   CO2 19 03/12/2012   Lab Results  Component Value Date   CREATININE 1.12 03/12/2012   Lab Results  Component Value Date   INR 1.12 01/21/2010   EKG: normal sinus rhythm  Arterial Doppler study: No Doppler signals noted in right foot. There was an audible popliteal signal on the right.  CT scan of abdomen and pelvis from April of this year was reviewed. There is no evidence of abdominal aortic or iliac aneurysms noted. There is no significant calcific disease noted in the femoral arteries.   I performed a Doppler exam myself and was able to obtain a biphasic posterior tibial signal above the ankle on the right although no flow into the foot in the plantar vessels. There was no dorsalis pedis anterior tibial or lateral tarsal signal in the right foot. It was a barely audible peroneal signal on the right. This is a change compared with study earlier we did not have a posterior tibial signal.  MEDICAL ISSUES: This patient has developed an ischemic right foot status post colectomy for Crohn's disease. The etiology of this is not clear. He has  no history to suggest a source for embolization. Specifically he denies any history of atrial fibrillation, arrhythmias, or history of recent myocardial infarction. Likewise he gives no history to suggest underlying peripheral vascular disease although he was a smoker. He denies claudication or rest pain. His CT scan from April would not suggest any obvious peripheral vascular disease or aortoiliac occlusive disease. He has no abdominal or iliac aneurysms noted on  CT scan. His popliteal pulses do not feel prominent to suggest popliteal artery aneurysms. This could potentially be a thrombotic event and he does state that the pain began after his SCD's had been placed on the right leg.  Regardless, the patient clearly has an ischemic right foot and I've recommended urgent transfer to [facility] for popliteal thrombectomy and exploration. Based on my exam he has thrombosed his anterior tibial and peroneal arteries. He does have a biphasic posterior tibial signal at the ankle but no flow onto the foot. I have discussed potentially using heparin postoperatively with Dr. Odie Sera. We will try to avoid a heparin bolus but will put him on heparin postoperatively. I have explained to the patient that this is clearly a limb threatening situation we need to proceed urgently. We've discussed the indications for surgery and the potential complications including wound healing problems bleeding, and limb loss. All his questions were answered and he is agreeable to proceed.  DICKSON,CHRISTOPHER S [facility] [place] Beeper: [phone]

## 2012-03-13 ENCOUNTER — Encounter (HOSPITAL_COMMUNITY): Payer: Self-pay | Admitting: Vascular Surgery

## 2012-03-13 DIAGNOSIS — I70209 Unspecified atherosclerosis of native arteries of extremities, unspecified extremity: Secondary | ICD-10-CM

## 2012-03-13 LAB — GLUCOSE, CAPILLARY
Glucose-Capillary: 118 mg/dL — ABNORMAL HIGH (ref 70–99)
Glucose-Capillary: 120 mg/dL — ABNORMAL HIGH (ref 70–99)
Glucose-Capillary: 108 mg/dL — ABNORMAL HIGH (ref 70–99)

## 2012-03-13 LAB — CBC
MCH: 29.8 pg (ref 26.0–34.0)
MCHC: 33.1 g/dL (ref 30.0–36.0)
MCV: 90.1 fL (ref 78.0–100.0)
Platelets: 280 10*3/uL (ref 150–400)
RBC: 2.82 MIL/uL — ABNORMAL LOW (ref 4.22–5.81)

## 2012-03-13 LAB — HEPARIN LEVEL (UNFRACTIONATED)
Heparin Unfractionated: <0.1 IU/mL — ABNORMAL LOW (ref 0.30–0.70)
Heparin Unfractionated: <0.1 IU/mL — ABNORMAL LOW (ref 0.30–0.70)

## 2012-03-13 LAB — BASIC METABOLIC PANEL
CO2: 23 mEq/L (ref 19–32)
Calcium: 7 mg/dL — ABNORMAL LOW (ref 8.4–10.5)
Creatinine, Ser: 0.75 mg/dL (ref 0.50–1.35)
Glucose, Bld: 118 mg/dL — ABNORMAL HIGH (ref 70–99)

## 2012-03-13 MED ORDER — KCL IN DEXTROSE-NACL 20-5-0.9 MEQ/L-%-% IV SOLN
INTRAVENOUS | Status: AC
  Administered 2012-03-13: 75 mL via INTRAVENOUS
  Filled 2012-03-13 (×3): qty 1000

## 2012-03-13 MED ORDER — SODIUM CHLORIDE 0.9 % IJ SOLN
10.0000 mL | Freq: Two times a day (BID) | INTRAMUSCULAR | Status: DC
  Administered 2012-03-13: 12 mL
  Administered 2012-03-13 – 2012-03-26 (×11): 10 mL
  Filled 2012-03-13 (×2): qty 10
  Filled 2012-03-13: qty 20
  Filled 2012-03-13 (×4): qty 10
  Filled 2012-03-13: qty 20
  Filled 2012-03-13: qty 10
  Filled 2012-03-13: qty 20
  Filled 2012-03-13 (×3): qty 10

## 2012-03-13 MED ORDER — FAT EMULSION 20 % IV EMUL
10.0000 mL | INTRAVENOUS | Status: AC
  Administered 2012-03-13: 10 mL via INTRAVENOUS
  Filled 2012-03-13 (×48): qty 100

## 2012-03-13 MED ORDER — ZINC TRACE METAL 1 MG/ML IV SOLN
INTRAVENOUS | Status: AC
  Administered 2012-03-13: 18:00:00 via INTRAVENOUS
  Filled 2012-03-13: qty 1000

## 2012-03-13 MED ORDER — SODIUM CHLORIDE 0.9 % IJ SOLN
10.0000 mL | INTRAMUSCULAR | Status: DC | PRN
  Administered 2012-03-13 – 2012-03-22 (×6): 10 mL
  Administered 2012-03-23: 20 mL
  Administered 2012-03-24 – 2012-03-28 (×5): 10 mL
  Filled 2012-03-13: qty 10
  Filled 2012-03-13: qty 20
  Filled 2012-03-13: qty 10
  Filled 2012-03-13: qty 20

## 2012-03-13 MED ORDER — HEPARIN (PORCINE) IN NACL 100-0.45 UNIT/ML-% IJ SOLN
2450.0000 [IU]/h | INTRAMUSCULAR | Status: DC
  Administered 2012-03-13: 1100 [IU]/h via INTRAVENOUS
  Administered 2012-03-14: 1300 [IU]/h via INTRAVENOUS
  Administered 2012-03-14: 1900 [IU]/h via INTRAVENOUS
  Filled 2012-03-13 (×6): qty 250

## 2012-03-13 MED ORDER — KCL IN DEXTROSE-NACL 20-5-0.9 MEQ/L-%-% IV SOLN
INTRAVENOUS | Status: DC
  Administered 2012-03-13: 35 mL via INTRAVENOUS
  Administered 2012-03-16 – 2012-03-17 (×2): via INTRAVENOUS
  Filled 2012-03-13 (×9): qty 1000

## 2012-03-13 NOTE — Progress Notes (Signed)
ANTICOAGULATION CONSULT NOTE - Initial Consult  Pharmacy Consult for heparin Indication: DVT  Allergies  Allergen Reactions  . Mesalamine     REACTION: Rash    Patient Measurements: Height: 5\' 9"  (175.3 cm) Weight: 145 lb 1 oz (65.8 kg) IBW/kg (Calculated) : 70.7   Vital Signs: Temp: 97.8 F (36.6 C) (08/07 0400) Temp src: Oral (08/07 0400) BP: 113/71 mmHg (08/07 0400) Pulse Rate: 107  (08/07 0400)  Labs:  Basename 03/13/12 0400 03/12/12 2143 03/12/12 1639 03/12/12 0325 03/11/12 1556  HGB 8.4* 8.9* -- -- --  HCT 25.4* 26.5* 25.0* -- --  PLT 280 286 -- 403* --  APTT -- -- -- -- --  LABPROT -- -- -- -- --  INR -- -- -- -- --  HEPARINUNFRC <0.10* -- -- -- --  CREATININE 0.75 -- -- 1.12 0.76  CKTOTAL -- -- -- -- --  CKMB -- -- -- -- --  TROPONINI -- -- -- -- --    Estimated Creatinine Clearance: 99.4 ml/min (by C-G formula based on Cr of 0.75).   Medical History: Past Medical History  Diagnosis Date  . Crohn's disease   . Perianal abscess 2001    s/p right hemicolectomy, and drainage of retroperitoneal abcess 2003  . Hypertension   . Hearing loss   . GI bleed 6/11    w/ normal EGD 6/11, 3 PRBCs   . Anemia     anemia thrombocytopenia, saw Hematology 2011, can not r/o myeloproliferative d/c    Medications:  Prescriptions prior to admission  Medication Sig Dispense Refill  . amLODipine (NORVASC) 5 MG tablet Take 5 mg by mouth every morning.      Marland Kitchen azaTHIOprine (IMURAN) 50 MG tablet Take 50 mg by mouth every morning.       Marland Kitchen oxyCODONE-acetaminophen (PERCOCET/ROXICET) 5-325 MG per tablet Take 1 tablet by mouth every 4 (four) hours as needed for pain.  15 tablet  0  . potassium chloride SA (K-DUR,KLOR-CON) 20 MEQ tablet Take 1 tablet (20 mEq total) by mouth 2 (two) times daily.  10 tablet  0  . spironolactone (ALDACTONE) 25 MG tablet Take 25 mg by mouth every morning.       Scheduled:    . antiseptic oral rinse  15 mL Mouth Rinse q12n4p  . chlorhexidine  15  mL Mouth Rinse BID  . ertapenem (INVANZ) IV  1 g Intravenous Q24H  . HYDROmorphone      . insulin aspart  0-9 Units Subcutaneous Q6H  . DISCONTD: heparin  5,000 Units Subcutaneous Q8H    Assessment: 56yo male s/p right tibial embolectomy with course of clot unclear now to begin therapeutic heparin; heparin has been running at 400 units/hr post-op.  Goal of Therapy:  Heparin level 0.3-0.7 units/ml Monitor platelets by anticoagulation protocol: Yes   Plan:  Will increase heparin gtt to 14 units/kg/hr = 900 units/hr and monitor heparin levels and CBC.  Colleen Can PharmD BCPS 03/13/2012,7:35 AM

## 2012-03-13 NOTE — Progress Notes (Signed)
Utilization review completed.  

## 2012-03-13 NOTE — Progress Notes (Signed)
Pt refused to elevate RLE, RN encouraged movement of RLE, pt refused stated was tingling, pain medicated see MAR, RN encouraged pt to elevate RLE after given pain med, upon assessment RN observed Right heel to be soft and non blanchable, RLE replaced on pillow, prevalon boot ordered, night RN made aware

## 2012-03-13 NOTE — Progress Notes (Signed)
ANTICOAGULATION CONSULT NOTE - Follow Up Consult  Pharmacy Consult:  Heparin Indication:  +DVT  Allergies  Allergen Reactions  . Mesalamine     REACTION: Rash    Patient Measurements: Height: 5\' 9"  (175.3 cm) Weight: 145 lb 1 oz (65.8 kg) IBW/kg (Calculated) : 70.7  Heparin Dosing Weight: 66 kg  Vital Signs: Temp: 98.9 F (37.2 C) (08/07 2000) Temp src: Oral (08/07 2000) BP: 139/83 mmHg (08/07 2000)  Labs:  Basename 03/13/12 2015 03/13/12 1405 03/13/12 0400 03/12/12 2143 03/12/12 1639 03/12/12 0325 03/11/12 1556  HGB -- -- 8.4* 8.9* -- -- --  HCT -- -- 25.4* 26.5* 25.0* -- --  PLT -- -- 280 286 -- 403* --  APTT -- 51* -- -- -- -- --  LABPROT -- -- -- -- -- -- --  INR -- -- -- -- -- -- --  HEPARINUNFRC <0.10* <0.10* <0.10* -- -- -- --  CREATININE -- -- 0.75 -- -- 1.12 0.76  CKTOTAL -- -- -- -- -- -- --  CKMB -- -- -- -- -- -- --  TROPONINI -- -- -- -- -- -- --    Estimated Creatinine Clearance: 99.4 ml/min (by C-G formula based on Cr of 0.75).    Assessment: 32 YOM s/p urgent right tibial embolectomy on 03/12/12 for ischemic right foot.  Pharmacy managing IV heparin and heparin level remains subtherapeutic.  No bleeding reported. No issues with infusion noted per RN.   Goal of Therapy:  Heparin level 0.3-0.7 units/ml Monitor platelets by anticoagulation protocol: Yes    Plan:  - Increase heparin gtt to 1300 units/hr, no bolus d/t recent embolectomy - Check 6 hr HL - Continue daily HL / CBC

## 2012-03-13 NOTE — Progress Notes (Signed)
Pt arrived from PACU to 3315.  Vital signs stable.  Pt alert and oriented, but very hard of hearing.  NG tube attached to LIWS.  Dressings clean, dry, intact. DP pulse absent, PT pulse heard with doppler. Will continue to monitor. Family at bedside.   Maximino Greenland RN

## 2012-03-13 NOTE — Progress Notes (Addendum)
PARENTERAL NUTRITION CONSULT NOTE - INITIAL  Pharmacy Consult for TNA Indication: Severe pre-op malnutrition and expected post op ileus  Allergies  Allergen Reactions  . Mesalamine     REACTION: Rash    Patient Measurements: Height: 5\' 9"  (175.3 cm) Weight: 145 lb 1 oz (65.8 kg) IBW/kg (Calculated) : 70.7  Usual Weight: ~70 kg  Vital Signs: Temp: 98.6 F (37 C) (08/07 1610) Temp src: Oral (08/07 0822) BP: 107/86 mmHg (08/07 0822) Pulse Rate: 107  (08/07 0822) Intake/Output from previous day: 08/06 0701 - 08/07 0700 In: 4776.8 [I.V.:4216.8; NG/GT:60; IV Piggyback:500] Out: 3835 [Urine:2980; Emesis/NG output:175; Drains:30; Stool:250; Blood:400] Intake/Output from this shift: Total I/O In: 120 [NG/GT:120] Out: 135 [Urine:125; Drains:10]  Labs:  [facility] 03/13/12 0400 03/12/12 2143 03/12/12 1639 03/12/12 0325  WBC 20.0* 21.6* -- 23.4*  HGB 8.4* 8.9* 8.5* --  HCT 25.4* 26.5* 25.0* --  PLT 280 286 -- 403*  APTT -- -- -- --  INR -- -- -- --     Basename 03/13/12 0400 03/12/12 1639 03/12/12 1628 03/12/12 0325 03/11/12 1556  NA 143 143 146* -- --  K 3.6 4.3 4.2 -- --  CL 116* -- -- 112 107  CO2 23 -- -- 19 20  GLUCOSE 118* 163* 448* -- --  BUN 10 -- -- 19 16  CREATININE 0.75 -- -- 1.12 0.76  LABCREA -- -- -- -- --  [id number] -- -- -- -- --  CALCIUM 7.0* -- -- 6.5* 6.4*  MG -- -- -- 1.7 --  PHOS -- -- -- 5.1* --  PROT -- -- -- 4.0* --  ALBUMIN -- -- -- 1.0* --  AST -- -- -- 10 --  ALT -- -- -- 5 --  ALKPHOS -- -- -- 70 --  BILITOT -- -- -- 0.2* --  BILIDIR -- -- -- -- --  IBILI -- -- -- -- --  PREALBUMIN -- -- -- 4.3* --  TRIG -- -- -- 93 --  CHOLHDL -- -- -- -- --  CHOL -- -- -- 79 --   Estimated Creatinine Clearance: 99.4 ml/min (by C-G formula based on Cr of 0.75).    Basename 03/13/12 0600 03/13/12 0008 03/12/12 2042  GLUCAP 118* 131* 108*    Medical History: Past Medical History  Diagnosis Date  . Crohn's disease   . Perianal abscess 2001     s/p right hemicolectomy, and drainage of retroperitoneal abcess 2003  . Hypertension   . Hearing loss   . GI bleed 6/11    w/ normal EGD 6/11, 3 PRBCs   . Anemia     anemia thrombocytopenia, saw Hematology 2011, can not r/o myeloproliferative d/c   CBGs & Insulin requirements past 24 hours:  CBGs <150 on BMET, no active insulin order  Nutritional Goals: RD recommendation: 9604-5409 kcal/day 75-94 g protein/day Goal is Clinimix 5/15 at 21ml/hr and IVFE 20% at 10 ml/hr on MWF = 96gm protein (1.5 g/kg/d), 1843 kCal on MWF, 1363 Kcal on non-lipid days  Current: Diet: NPO with NGT IVF: [id number] @ 75 ml/hr  Assessment:  10 YOM with severe Crohn's colitis of the right colon with perforation into the retroperitoneum and retroperitoneal abscess status post right hemicolectomy with anastomosis 10 years ago. Developed increased postprandial pain with 20 pound weight loss over the past month due to lack of appetite. CT shows massively dilated transverse colon with thickening and inflammatory change extending over to the left colon. He is now s/p partial colectomy with end ileostomy and  repair of enterotomy on 03/11/2012.  PICC line ordered 8/5; TNA was to start 8/6 given severe pre-op malnutrition and expected post op ileus however, PICC not able to be placed yet as pt was in surgery 8/6 for urgent thrombectomy 2/2 ischemic R foot.  GI: s/p subtotal colectomy w/ end ileostmy and repair of enterotomy 8/5, TNA started for anticipation of post-op ileus Endo: no h/o DM, CBG at goal of <180 off TNA  Lytes: corrected Ca = 9.4, K ok, Phos was elevated yesterday, not reported today Renal: SCr stable, UOP good at 1.9 ml/kg/hr Hepatobil:  LFTs WNL, prealbumin low at 4.3, as expected Pulm: RA Neuro: AAO Cards: VSS ID: [id number], ertapenem 8/5>> Best Practices: scds, mouthcare, heparin drip  Plan:  - F/up PICC placement - Add on phos to today's am labs to determine +/- lytes  - Once PICC placed, start  Clinimix 5/15 (+/- lytes per phos results) at 40 ml/h; increase to goal, change formula once lytes/cbg stable - Provide available trace elements, MVI, Lipids at 10 ml/hr MWF only 2/2 national shortage - Continue sensitive SSI q6h - TNA labs Monday/Thursdays  Tallen Schnorr L. Illene Bolus, PharmD, BCPS Clinical Pharmacist Pager: [phone] Pharmacy: [phone] 03/13/2012 9:12 AM   Addendum:  PICC placed.  Phos reported as 2.8.  Will start Clinimix E5/15 at 40 ml/hr with lipids at 10 ml/hr, as above;  decrease IVF accordingly when TPN starts.  F/u am TPN labs.  Shenae Bonanno L. Illene Bolus, PharmD, BCPS Clinical Pharmacist Pager: [phone] Pharmacy: [phone] 03/13/2012 1:03 PM

## 2012-03-13 NOTE — Progress Notes (Signed)
ANTICOAGULATION CONSULT NOTE - Follow Up Consult  Pharmacy Consult:  Heparin Indication:  +DVT  Allergies  Allergen Reactions  . Mesalamine     REACTION: Rash    Patient Measurements: Height: 5\' 9"  (175.3 cm) Weight: 145 lb 1 oz (65.8 kg) IBW/kg (Calculated) : 70.7  Heparin Dosing Weight: 66 kg  Vital Signs: Temp: 99.1 F (37.3 C) (08/07 1153) Temp src: Oral (08/07 1153) BP: 107/86 mmHg (08/07 0822) Pulse Rate: 107  (08/07 0822)  Labs:  Basename 03/13/12 1405 03/13/12 0400 03/12/12 2143 03/12/12 1639 03/12/12 0325 03/11/12 1556  HGB -- 8.4* 8.9* -- -- --  HCT -- 25.4* 26.5* 25.0* -- --  PLT -- 280 286 -- 403* --  APTT 51* -- -- -- -- --  LABPROT -- -- -- -- -- --  INR -- -- -- -- -- --  HEPARINUNFRC <0.10* <0.10* -- -- -- --  CREATININE -- 0.75 -- -- 1.12 0.76  CKTOTAL -- -- -- -- -- --  CKMB -- -- -- -- -- --  TROPONINI -- -- -- -- -- --    Estimated Creatinine Clearance: 99.4 ml/min (by C-G formula based on Cr of 0.75).    Assessment: 37 YOM s/p urgent right tibial embolectomy on 03/12/12 for ischemic right foot.  Pharmacy managing IV heparin and heparin level is currently subtherapeutic.  No bleeding reported.   Goal of Therapy:  Heparin level 0.3-0.7 units/ml Monitor platelets by anticoagulation protocol: Yes    Plan:  - Increase heparin gtt to 1100 units/hr, no bolus d/t recent embolectomy - Check 6 hr HL - Continue daily HL / CBC

## 2012-03-13 NOTE — Progress Notes (Signed)
VASCULAR PROGRESS NOTE  SUBJECTIVE: No complaints. Foot feels better  PHYSICAL EXAM: Filed Vitals:   03/12/12 1930 03/12/12 2000 03/13/12 0000 03/13/12 0400  BP: 113/67 122/76 112/71 113/71  Pulse: 97  104 107  Temp:  97.7 F (36.5 C) 98.6 F (37 C) 97.8 F (36.6 C)  TempSrc:  Oral Oral Oral  Resp: 20 27 25 25   Height:  5\' 9"  (1.753 m)    Weight:  145 lb 1 oz (65.8 kg)    SpO2: 100% 100% 100% 100%   Right foot warm Brisk peroneal, ATA, and PT signal with the doppler JP around 20 cc last shift   LABS: Lab Results  Component Value Date   WBC 20.0* 03/13/2012   HGB 8.4* 03/13/2012   HCT 25.4* 03/13/2012   MCV 90.1 03/13/2012   PLT 280 03/13/2012   Lab Results  Component Value Date   CREATININE 0.75 03/13/2012   Lab Results  Component Value Date   INR 1.12 01/21/2010   CBG (last 3)   Basename 03/13/12 0600 03/13/12 0008 03/12/12 2042  GLUCAP 118* 131* 108*     ASSESSMENT/PLAN: 1. 1 Day Post-Op s/p: right tibial embolectomy. Source of clot not clear. Path pending. Will order 2D Echo to rule out a cardiac source of embolization. Increase to full heparin. D/C JP if dng less than 25 cc for 8 hours once on full heparin.   2. Ambulate  Waverly Ferrari, MD, FACS Beeper: [phone] 03/13/2012

## 2012-03-13 NOTE — Progress Notes (Signed)
*  PRELIMINARY RESULTS* Echocardiogram 2D Echocardiogram has been performed.  Rick Edwards 03/13/2012, 9:41 AM

## 2012-03-13 NOTE — Progress Notes (Signed)
Peripherally Inserted Central Catheter/Midline Placement  The IV Nurse has discussed with the patient and/or persons authorized to consent for the patient, the purpose of this procedure and the potential benefits and risks involved with this procedure.  The benefits include less needle sticks, lab draws from the catheter and patient may be discharged home with the catheter.  Risks include, but not limited to, infection, bleeding, blood clot (thrombus formation), and puncture of an artery; nerve damage and irregular heat beat.  Alternatives to this procedure were also discussed.  PICC/Midline Placement Documentation        Rick Edwards 03/13/2012, 12:51 PM

## 2012-03-13 NOTE — Progress Notes (Signed)
Patient ID: Rick Edwards, male   DOB: May 09, 1956, 56 y.o.   MRN: [id number] 1 Day Post-Op  Subjective: Still some right foot pain but better than yesterday. Denies abd pain  Objective: Vital signs in last 24 hours: Temp:  [97.5 F (36.4 C)-98.6 F (37 C)] 97.8 F (36.6 C) (08/07 0400) Pulse Rate:  [44-125] 107  (08/07 0400) Resp:  [19-33] 25  (08/07 0400) BP: (110-156)/(67-87) 113/71 mmHg (08/07 0400) SpO2:  [65 %-100 %] 100 % (08/07 0400) Weight:  [145 lb 1 oz (65.8 kg)] 145 lb 1 oz (65.8 kg) (08/06 2000) Last BM Date: 03/11/12  Intake/Output from previous day: 08/06 0701 - 08/07 0700 In: 4776.8 [I.V.:4216.8; NG/GT:60; IV Piggyback:500] Out: 3835 [Urine:2980; Emesis/NG output:175; Drains:30; Stool:250; Blood:400] Intake/Output this shift:    General appearance: alert and no distress GI: abnormal findings:  mild tenderness in the entire abdomen and stoma pink and healthy with small amt stool.  Non distended Extremities: Toes cool but most of right foot warm Incision/Wound: Open midline wound redressed, clean  Lab Results:   Basename 03/13/12 0400 03/12/12 2143  WBC 20.0* 21.6*  HGB 8.4* 8.9*  HCT 25.4* 26.5*  PLT 280 286   BMET  Basename 03/13/12 0400 03/12/12 1639 03/12/12 0325  NA 143 143 --  K 3.6 4.3 --  CL 116* -- 112  CO2 23 -- 19  GLUCOSE 118* 163* --  BUN 10 -- 19  CREATININE 0.75 -- 1.12  CALCIUM 7.0* -- 6.5*     Studies/Results: No results found.  Anti-infectives: Anti-infectives     Start     Dose/Rate Route Frequency Ordered Stop   03/11/12 1930   ertapenem (INVANZ) 1 g in sodium chloride 0.9 % 50 mL IVPB        1 g 100 mL/hr over 30 Minutes Intravenous Every 24 hours 03/11/12 1835     03/11/12 0809   ertapenem (INVANZ) 1 g in sodium chloride 0.9 % 50 mL IVPB        1 g 100 mL/hr over 30 Minutes Intravenous 60 min pre-op 03/11/12 0809 03/11/12 1100          Assessment/Plan: s/p Procedure(s): EMBOLECTOMY INTRA OPERATIVE  ARTERIOGRAM Abdominal colectomy and ileostomy Stable post op.  Cont NG today. TNA to start   LOS: 2 days    Rick Edwards T 03/13/2012

## 2012-03-13 NOTE — Anesthesia Postprocedure Evaluation (Signed)
  Anesthesia Post-op Note  Patient: Rick Edwards  Procedure(s) Performed: Procedure(s) (LRB): EMBOLECTOMY (Right) INTRA OPERATIVE ARTERIOGRAM (Right)  Patient Location: PACU  Anesthesia Type: General  Level of Consciousness: awake and alert   Airway and Oxygen Therapy: Patient Spontanous Breathing  Post-op Pain: mild  Post-op Assessment: Post-op Vital signs reviewed, Patient's Cardiovascular Status Stable, Respiratory Function Stable, Patent Airway, No signs of Nausea or vomiting and Pain level controlled  Post-op Vital Signs: stable  Complications: No apparent anesthesia complications

## 2012-03-14 DIAGNOSIS — Z48812 Encounter for surgical aftercare following surgery on the circulatory system: Secondary | ICD-10-CM

## 2012-03-14 LAB — APTT: aPTT: 75 seconds — ABNORMAL HIGH (ref 24–37)

## 2012-03-14 LAB — GLUCOSE, CAPILLARY: Glucose-Capillary: 119 mg/dL — ABNORMAL HIGH (ref 70–99)

## 2012-03-14 LAB — HEPARIN LEVEL (UNFRACTIONATED)
Heparin Unfractionated: <0.1 IU/mL — ABNORMAL LOW (ref 0.30–0.70)
Heparin Unfractionated: <0.1 IU/mL — ABNORMAL LOW (ref 0.30–0.70)

## 2012-03-14 LAB — BASIC METABOLIC PANEL
BUN: 5 mg/dL — ABNORMAL LOW (ref 6–23)
Creatinine, Ser: 0.54 mg/dL (ref 0.50–1.35)
GFR calc Af Amer: >90 mL/min (ref >90)
GFR calc non Af Amer: >90 mL/min (ref >90)

## 2012-03-14 LAB — COMPREHENSIVE METABOLIC PANEL
ALT: 15 U/L (ref 0–53)
AST: 17 U/L (ref 0–37)
Albumin: 1.2 g/dL — ABNORMAL LOW (ref 3.5–5.2)
Chloride: 112 mEq/L (ref 96–112)
Creatinine, Ser: 0.57 mg/dL (ref 0.50–1.35)
Sodium: 142 mEq/L (ref 135–145)
Total Bilirubin: 0.3 mg/dL (ref 0.3–1.2)

## 2012-03-14 LAB — CBC
MCV: 88.5 fL (ref 78.0–100.0)
Platelets: 234 10*3/uL (ref 150–400)
RDW: 18.3 % — ABNORMAL HIGH (ref 11.5–15.5)
WBC: 20.1 10*3/uL — ABNORMAL HIGH (ref 4.0–10.5)

## 2012-03-14 LAB — PHOSPHORUS: Phosphorus: 2.8 mg/dL (ref 2.3–4.6)

## 2012-03-14 MED ORDER — CLINIMIX E/DEXTROSE (5/15) 5 % IV SOLN
INTRAVENOUS | Status: AC
  Administered 2012-03-14: 18:00:00 via INTRAVENOUS
  Filled 2012-03-14: qty 1000

## 2012-03-14 MED ORDER — POTASSIUM CHLORIDE 10 MEQ/50ML IV SOLN
10.0000 meq | INTRAVENOUS | Status: AC
  Administered 2012-03-14 (×4): 10 meq via INTRAVENOUS
  Filled 2012-03-14 (×2): qty 100

## 2012-03-14 NOTE — Progress Notes (Signed)
ANTICOAGULATION CONSULT NOTE - Follow Up Consult  Pharmacy Consult for heparin Indication: DVT  Labs:  Basename 03/14/12 0418 03/13/12 2015 03/13/12 1405 03/13/12 0400 03/12/12 2143 03/12/12 0325 03/11/12 1556  HGB 8.0* -- -- 8.4* -- -- --  HCT 23.1* -- -- 25.4* 26.5* -- --  PLT 234 -- -- 280 286 -- --  APTT 75* -- 51* -- -- -- --  LABPROT -- -- -- -- -- -- --  INR -- -- -- -- -- -- --  HEPARINUNFRC <0.10* <0.10* <0.10* -- -- -- --  CREATININE -- -- -- 0.75 -- 1.12 0.76  CKTOTAL -- -- -- -- -- -- --  CKMB -- -- -- -- -- -- --  TROPONINI -- -- -- -- -- -- --    Assessment: 55yo male remains undetectable on heparin despite rate increases; no infusion issues per RN.  Goal of Therapy:  Heparin level 0.3-0.7 units/ml   Plan:  Will increase gtt by 4 units/kg/hr to 1600 units/hr and check level in 6hr.  Colleen Can PharmD BCPS 03/14/2012,5:06 AM

## 2012-03-14 NOTE — Progress Notes (Signed)
ANTICOAGULATION CONSULT NOTE - Follow Up Consult  Pharmacy Consult:  Heparin Indication:  +DVT, s/p embolectomy  Allergies  Allergen Reactions  . Mesalamine     REACTION: Rash    Patient Measurements: Height: 5\' 9"  (175.3 cm) Weight: 145 lb 1 oz (65.8 kg) IBW/kg (Calculated) : 70.7  Heparin Dosing Weight: 66 kg  Vital Signs: Temp: 98.6 F (37 C) (08/08 0715) Temp src: Oral (08/08 0715) BP: 145/75 mmHg (08/08 0715)  Labs:  Basename 03/14/12 1059 03/14/12 0418 03/13/12 2015 03/13/12 1405 03/13/12 0400 03/12/12 2143  HGB -- 8.0* -- -- 8.4* --  HCT -- 23.1* -- -- 25.4* 26.5*  PLT -- 234 -- -- 280 286  APTT -- 75* -- 51* -- --  LABPROT -- -- -- -- -- --  INR -- -- -- -- -- --  HEPARINUNFRC <0.10* <0.10* <0.10* -- -- --  CREATININE 0.54 0.57 -- -- 0.75 --  CKTOTAL -- -- -- -- -- --  CKMB -- -- -- -- -- --  TROPONINI -- -- -- -- -- --    Estimated Creatinine Clearance: 99.4 ml/min (by C-G formula based on Cr of 0.54).    Assessment: 65 YOM s/p urgent right tibial embolectomy on 03/12/12 for ischemic right foot.  Pharmacy managing IV heparin and heparin level remains subtherapeutic.  No infusion issues per RN.  Noted some oozing around JP drain.   Goal of Therapy:  Heparin level 0.3-0.7 units/ml Monitor platelets by anticoagulation protocol: Yes    Plan:  - Increase heparin gtt to 1900 units/hr - Check 6 hr HL - Continue daily HL / CBC

## 2012-03-14 NOTE — Progress Notes (Signed)
ANTICOAGULATION CONSULT NOTE - Follow Up Consult  Pharmacy Consult:  Heparin Indication:  +DVT, s/p embolectomy  Allergies  Allergen Reactions  . Mesalamine     REACTION: Rash    Patient Measurements: Height: 5\' 9"  (175.3 cm) Weight: 145 lb 1 oz (65.8 kg) IBW/kg (Calculated) : 70.7  Heparin Dosing Weight: 66 kg  Vital Signs: Temp: 98.4 F (36.9 C) (08/08 1533) Temp src: Oral (08/08 1228) BP: 141/92 mmHg (08/08 1630)  Labs:  Basename 03/14/12 1800 03/14/12 1059 03/14/12 0418 03/13/12 1405 03/13/12 0400 03/12/12 2143  HGB -- -- 8.0* -- 8.4* --  HCT -- -- 23.1* -- 25.4* 26.5*  PLT -- -- 234 -- 280 286  APTT -- -- 75* 51* -- --  LABPROT -- -- -- -- -- --  INR -- -- -- -- -- --  HEPARINUNFRC <0.10* <0.10* <0.10* -- -- --  CREATININE -- 0.54 0.57 -- 0.75 --  CKTOTAL -- -- -- -- -- --  CKMB -- -- -- -- -- --  TROPONINI -- -- -- -- -- --    Estimated Creatinine Clearance: 99.4 ml/min (by C-G formula based on Cr of 0.54).   Assessment: 53 YOM s/p urgent right tibial embolectomy on 03/12/12 for ischemic right foot.  Pharmacy managing IV heparin and heparin level remains subtherapeutic.  No infusion issues per RN.  Heparin level has been undetectable for almost 48 hours now.  Pt is on an infusion rate of ~ 28 units/kg/hr.  Question if patient has heparin resistance.  Will check ATIII with next lab draw.   Goal of Therapy:  Heparin level 0.3-0.7 units/ml Monitor platelets by anticoagulation protocol: Yes    Plan:  - Increase heparin gtt to 2200 units/hr - Check heparin level, CBC, and ATIII with AM labs. - Consider transition to Lovenox, Arixtra, or Bivalrudin if patient demonstrates signs of heparin resistance based on ATIII.  Pharmacy will discuss options with VVS in AM. - Follow up plans for long-term oral anticoagulation (pending resolve of ileus).  [employer], Pharm.D., BCPS Clinical Pharmacist Pager [phone] 03/14/2012 7:34 PM

## 2012-03-14 NOTE — Progress Notes (Signed)
Patient ID: Rick Edwards, male   DOB: 1955-08-18, 56 y.o.   MRN: [id number] 2 Days Post-Op  Subjective: Up in chair, feels better.  Denies abdominal pain.  Less foot pain  Objective: Vital signs in last 24 hours: Temp:  [98.5 F (36.9 C)-99.1 F (37.3 C)] 98.6 F (37 C) (08/08 0715) Resp:  [10-21] 12  (08/08 0715) BP: (120-145)/(75-97) 145/75 mmHg (08/08 0715) SpO2:  [93 %-94 %] 93 % (08/08 0003) Last BM Date: 03/11/12  Intake/Output from previous day: 08/07 0701 - 08/08 0700 In: 1315 [I.V.:595; NG/GT:120; TPN:600] Out: 2805 [Urine:2650; Emesis/NG output:50; Drains:25; Stool:80] Intake/Output this shift:    General appearance: alert and no distress GI: Soft, mild distention, generally non tender with minimal tenderness RLQ Extremities: Right foot warm Incision/Wound: Dressed, clean and dry  Lab Results:   Basename 03/14/12 0418 03/13/12 0400  WBC 20.1* 20.0*  HGB 8.0* 8.4*  HCT 23.1* 25.4*  PLT 234 280   BMET  Basename 03/14/12 0418 03/13/12 0400  NA 142 143  K 3.1* 3.6  CL 112 116*  CO2 26 23  GLUCOSE 127* 118*  BUN 6 10  CREATININE 0.57 0.75  CALCIUM 7.5* 7.0*     Studies/Results: Dg Ang/ext/uni/or Right  03/13/2012  *RADIOLOGY REPORT*  Clinical data:  Right foot ischemia  INTRAOPERATIVE ARTERIOGRAM  Findings:  Injection of the level of the distal popliteal artery opacifies the anterior tibial artery to the lower calf which terminates at a meniscus, peroneal artery to the lower calf, and posterior tibial artery which terminates at a meniscus just above the ankle.  IMPRESSION: 1.  Diseased trifurcation runoff as above.  Original Report Authenticated By: Osa Craver, M.D.    Anti-infectives: Anti-infectives     Start     Dose/Rate Route Frequency Ordered Stop   03/11/12 1930   ertapenem (INVANZ) 1 g in sodium chloride 0.9 % 50 mL IVPB        1 g 100 mL/hr over 30 Minutes Intravenous Every 24 hours 03/11/12 1835     03/11/12 0809   ertapenem  (INVANZ) 1 g in sodium chloride 0.9 % 50 mL IVPB        1 g 100 mL/hr over 30 Minutes Intravenous 60 min pre-op 03/11/12 0809 03/11/12 1100          Assessment/Plan: s/p Procedure(s): EMBOLECTOMY INTRA OPERATIVE ARTERIOGRAM POD #3 colectomy and ileostomy. Stable.  Persistent elevated WBC but abdomen seems benign. Cont abx for now. Cont NG  On TNA   LOS: 3 days    Navada Osterhout T 03/14/2012

## 2012-03-14 NOTE — Progress Notes (Signed)
VASCULAR LAB PRELIMINARY  ARTERIAL  ABI completed:    RIGHT    LEFT    PRESSURE WAVEFORM  PRESSURE WAVEFORM  BRACHIAL  Triphasic BRACHIAL 150 Triphasic         AT 170 Biphasic AT 173 Triphasic  PT 190 Triphasic PT 173 Triphasic                  RIGHT LEFT  ABI 1.27 1.15   ABIs and Doppler waveforms are within normal limits bilaterally at rest. Right Brachial blood pressure not obtained due to restricted limb.  Kellie Chisolm, RVS 03/14/2012, 4:00 PM

## 2012-03-14 NOTE — Consult Note (Addendum)
WOC ostomy consult .  (VVS service following for assessment and plan of care to foot.) Stoma type/location: Ileostomy  Stomal assessment/size: Right lower quad, stoma red, above skin level, 1 3/4 inches in diameter,  Peristomal assessment: Intact Output: Scant amount of yellow stool, no flatus or significant output Ostomy pouching: 1pc. Education provided: Demonstrated how to apply and empty the pouch, Pt was able to close velcro. Pt has had past experience with a colostomy and understood the teaching. Supplies ordered to bedside.   Cammie Mcgee, RN, MSN, [employer]  [phone]

## 2012-03-14 NOTE — Progress Notes (Signed)
Pt up to chair, VSS. Visited by WOC RN, surgeons. Will continue to monitor.

## 2012-03-14 NOTE — Progress Notes (Addendum)
[facility]   Progress Note Bypass Surgery  Date of Surgery: 03/11/2012 - 03/12/2012  Procedure(s): EMBOLECTOMY INTRA OPERATIVE ARTERIOGRAM Surgeon: Surgeon(s): Chuck Hint, MD  2 Days Post-Op  History of Present Illness  Rick Edwards is a 56 y.o. male who is S/P Procedure(s): EMBOLECTOMY INTRA OPERATIVE ARTERIOGRAM right.  The patient's pre-op symptoms of pain are slightly  Improved .  He has pain with movement and to touch donning and doffing his sock.   Patients pain is well controlled.    VASC. LAB Studies:       pending   Imaging: Dg Ang/ext/uni/or Right  03/13/2012  *RADIOLOGY REPORT*  Clinical data:  Right foot ischemia  INTRAOPERATIVE ARTERIOGRAM  Findings:  Injection of the level of the distal popliteal artery opacifies the anterior tibial artery to the lower calf which terminates at a meniscus, peroneal artery to the lower calf, and posterior tibial artery which terminates at a meniscus just above the ankle.  IMPRESSION: 1.  Diseased trifurcation runoff as above.  Original Report Authenticated By: Osa Craver, M.D.    Significant Diagnostic Studies: CBC Lab Results  Component Value Date   WBC 20.1* 03/14/2012   HGB 8.0* 03/14/2012   HCT 23.1* 03/14/2012   MCV 88.5 03/14/2012   PLT 234 03/14/2012    BMET     Component Value Date/Time   NA 142 03/14/2012 0418   K 3.1* 03/14/2012 0418   CL 112 03/14/2012 0418   CO2 26 03/14/2012 0418   GLUCOSE 127* 03/14/2012 0418   GLUCOSE 101 02/26/2008 1535   BUN 6 03/14/2012 0418   CREATININE 0.57 03/14/2012 0418   CALCIUM 7.5* 03/14/2012 0418   GFRNONAA >90 03/14/2012 0418   GFRAA >90 03/14/2012 0418    COAG Lab Results  Component Value Date   INR 1.12 01/21/2010   No results found for this basename: PTT    Physical Examination  BP Readings from Last 3 Encounters:  03/14/12 126/77  03/14/12 126/77  03/14/12 126/77   Temp Readings from Last 3 Encounters:  03/14/12 98.6 F (37 C)     03/14/12 98.6 F (37 C)   03/14/12 98.6 F (37 C)    SpO2 Readings from Last 3 Encounters:  03/14/12 93%  03/14/12 93%  03/14/12 93%   Pulse Readings from Last 3 Encounters:  03/13/12 107  03/13/12 107  03/13/12 107    Pt is A&O x 3 right lower extremity: Incision/s is/are draining and .intact, and  Healing Drain out put 25cc last 24 hours Limb is warm; with good color Brisk peroneal, ATA, and PT signal with the doppler     Assessment/Plan: Pt. Doing well Post-op pain is controlled Wounds are healing well PT/OT for ambulation Continue wound care as ordered We will d/c the drain when the dressing is changed later today  Clinton Gallant [facility] [phone] 03/14/2012 7:28 AM  Agree with above. Discontinue JP 2D Echo- no cardiac source of clot noted. Path on clot- nothing unusual   Di Kindle. Edilia Bo, MD, FACS Beeper [phone] 03/14/2012

## 2012-03-14 NOTE — Progress Notes (Signed)
PARENTERAL NUTRITION CONSULT NOTE - INITIAL  Pharmacy Consult for TNA Indication: Severe pre-op malnutrition and expected post op ileus  Allergies  Allergen Reactions  . Mesalamine     REACTION: Rash    Patient Measurements: Height: 5\' 9"  (175.3 cm) Weight: 145 lb 1 oz (65.8 kg) IBW/kg (Calculated) : 70.7  Usual Weight: ~70 kg  Vital Signs: Temp: 98.6 F (37 C) (08/08 0715) Temp src: Oral (08/08 0715) BP: 145/75 mmHg (08/08 0715) Intake/Output from previous day: 08/07 0701 - 08/08 0700 In: 1315 [I.V.:595; NG/GT:120; TPN:600] Out: 2805 [Urine:2650; Emesis/NG output:50; Drains:25; Stool:80] Intake/Output from this shift:    Labs:  [facility] 03/14/12 0418 03/13/12 1405 03/13/12 0400 03/12/12 2143  WBC 20.1* -- 20.0* 21.6*  HGB 8.0* -- 8.4* 8.9*  HCT 23.1* -- 25.4* 26.5*  PLT 234 -- 280 286  APTT 75* 51* -- --  INR -- -- -- --     Basename 03/14/12 0418 03/13/12 0400 03/12/12 1639 03/12/12 0325  NA 142 143 143 --  K 3.1* 3.6 4.3 --  CL 112 116* -- 112  CO2 26 23 -- 19  GLUCOSE 127* 118* 163* --  BUN 6 10 -- 19  CREATININE 0.57 0.75 -- 1.12  LABCREA -- -- -- --  CREAT24HRUR -- -- -- --  CALCIUM 7.5* 7.0* -- 6.5*  MG 2.0 -- -- 1.7  PHOS 2.8 2.8 -- 5.1*  PROT 4.2* -- -- 4.0*  ALBUMIN 1.2* -- -- 1.0*  AST 17 -- -- 10  ALT 15 -- -- 5  ALKPHOS 78 -- -- 70  BILITOT 0.3 -- -- 0.2*  BILIDIR -- -- -- --  IBILI -- -- -- --  PREALBUMIN -- -- -- 4.3*  TRIG -- -- -- 93  CHOLHDL -- -- -- --  CHOL -- -- -- 79   Estimated Creatinine Clearance: 99.4 ml/min (by C-G formula based on Cr of 0.57).    Basename 03/14/12 0542 03/14/12 0003 03/13/12 1806  GLUCAP 119* 116* 108*    Medical History: Past Medical History  Diagnosis Date  . Crohn's disease   . Perianal abscess 2001    s/p right hemicolectomy, and drainage of retroperitoneal abcess 2003  . Hypertension   . Hearing loss   . GI bleed 6/11    w/ normal EGD 6/11, 3 PRBCs   . Anemia     anemia  thrombocytopenia, saw Hematology 2011, can not r/o myeloproliferative d/c   CBGs & Insulin requirements past 24 hours:  1 unit SSI on sensitive SSI q6h  Nutritional Goals: RD recommendation: 9629-5284 kcal/day 75-94 g protein/day Goal is Clinimix 5/15 at 63ml/hr and IVFE 20% at 10 ml/hr on MWF = 96gm protein (1.5 g/kg/d), 1843 kCal on MWF, 1363 Kcal on non-lipid days  Current: Diet: NPO with NGT  Assessment:  42 YOM with severe Crohn's colitis of the right colon with perforation into the retroperitoneum and retroperitoneal abscess status post right hemicolectomy with anastomosis 10 years ago. Developed increased postprandial pain with 20 pound weight loss over the past month due to lack of appetite. CT shows massively dilated transverse colon with thickening and inflammatory change extending over to the left colon. He is now s/p partial colectomy with end ileostomy and repair of enterotomy on 03/11/2012.  PICC line ordered 8/5; TNA was to start 8/6 given severe pre-op malnutrition and expected post op ileus however, PICC not able to be placed yet as pt was in surgery 8/6 for urgent thrombectomy 2/2 ischemic R foot.  Tolerated  TPN initiation overnight  GI: s/p subtotal colectomy w/ end ileostmy and repair of enterotomy 8/5, TNA started for anticipation of post-op ileus Endo: no h/o DM, CBG at goal of <180 Lytes: corrected Ca = 9.7, K low, Phos stable Renal: SCr stable, UOP good at 1.9 ml/kg/hr Hepatobil:  LFTs WNL, prealbumin low at 4.3, as expected Pulm: RA Neuro: AAO Cards: VSS ID: afeb, WBC remain elevated, empiric ertapenem 8/5>> Best Practices: scds, mouthcare, heparin drip  Plan:  - Continue Clinimix E5/15 at 40 ml/h, increasing to goal once lytes/cbg stable - Provide available trace elements, MVI, Lipids at 10 ml/hr MWF only 2/2 national shortage - Continue sensitive SSI q6h - Replete K with 4k runs - f/u am BMP, cbgs  Tausha Milhoan L. Illene Bolus, PharmD, BCPS Clinical  Pharmacist Pager: [phone] Pharmacy: [phone] 03/14/2012 8:40 AM

## 2012-03-15 LAB — GLUCOSE, CAPILLARY
Glucose-Capillary: 117 mg/dL — ABNORMAL HIGH (ref 70–99)
Glucose-Capillary: 125 mg/dL — ABNORMAL HIGH (ref 70–99)

## 2012-03-15 LAB — POCT I-STAT 4, (NA,K, GLUC, HGB,HCT)
Glucose, Bld: 125 mg/dL — ABNORMAL HIGH (ref 70–99)
HCT: 38 % — ABNORMAL LOW (ref 39.0–52.0)
Hemoglobin: 12.9 g/dL — ABNORMAL LOW (ref 13.0–17.0)
Hemoglobin: 12.9 g/dL — ABNORMAL LOW (ref 13.0–17.0)
Potassium: 6 mEq/L — ABNORMAL HIGH (ref 3.5–5.1)
Sodium: 137 mEq/L (ref 135–145)

## 2012-03-15 LAB — BASIC METABOLIC PANEL
BUN: 4 mg/dL — ABNORMAL LOW (ref 6–23)
Calcium: 7.4 mg/dL — ABNORMAL LOW (ref 8.4–10.5)
Creatinine, Ser: 0.57 mg/dL (ref 0.50–1.35)
GFR calc non Af Amer: >90 mL/min (ref >90)
Glucose, Bld: 125 mg/dL — ABNORMAL HIGH (ref 70–99)
Sodium: 141 mEq/L (ref 135–145)

## 2012-03-15 LAB — ANTITHROMBIN III: AntiThromb III Func: 42 % — ABNORMAL LOW (ref 75–120)

## 2012-03-15 LAB — CBC
MCH: 30.1 pg (ref 26.0–34.0)
MCV: 88.2 fL (ref 78.0–100.0)
Platelets: 217 10*3/uL (ref 150–400)
RBC: 2.46 MIL/uL — ABNORMAL LOW (ref 4.22–5.81)

## 2012-03-15 MED ORDER — MAGNESIUM SULFATE 40 MG/ML IJ SOLN
2.0000 g | Freq: Once | INTRAMUSCULAR | Status: AC
  Administered 2012-03-15: 2 g via INTRAVENOUS
  Filled 2012-03-15: qty 50

## 2012-03-15 MED ORDER — FAT EMULSION 20 % IV EMUL
10.0000 mL | INTRAVENOUS | Status: AC
  Administered 2012-03-15: 10 mL via INTRAVENOUS
  Filled 2012-03-15 (×48): qty 100

## 2012-03-15 MED ORDER — ZINC TRACE METAL 1 MG/ML IV SOLN
INTRAVENOUS | Status: AC
  Administered 2012-03-15: 18:00:00 via INTRAVENOUS
  Filled 2012-03-15: qty 1000

## 2012-03-15 MED ORDER — ENOXAPARIN SODIUM 80 MG/0.8ML ~~LOC~~ SOLN
65.0000 mg | Freq: Two times a day (BID) | SUBCUTANEOUS | Status: DC
  Administered 2012-03-15 – 2012-03-23 (×16): 65 mg via SUBCUTANEOUS
  Administered 2012-03-23: 16:00:00 via SUBCUTANEOUS
  Administered 2012-03-24: 65 mg via SUBCUTANEOUS
  Administered 2012-03-24: 16:00:00 via SUBCUTANEOUS
  Administered 2012-03-25 – 2012-03-27 (×5): 65 mg via SUBCUTANEOUS
  Filled 2012-03-15 (×26): qty 0.8

## 2012-03-15 MED ORDER — POTASSIUM CHLORIDE 10 MEQ/50ML IV SOLN
10.0000 meq | INTRAVENOUS | Status: AC
  Administered 2012-03-15 (×4): 10 meq via INTRAVENOUS
  Filled 2012-03-15 (×4): qty 50

## 2012-03-15 NOTE — Progress Notes (Signed)
ANTICOAGULATION CONSULT NOTE - Follow Up Consult  Pharmacy Consult for Lovenox Indication: +DVT, s/p embolectomy   Allergies  Allergen Reactions  . Mesalamine     REACTION: Rash    Patient Measurements: Height: 5\' 9"  (175.3 cm) Weight: 145 lb 1 oz (65.8 kg) IBW/kg (Calculated) : 70.7   Vital Signs: Temp: 99 F (37.2 C) (08/09 1200) Temp src: Oral (08/09 0800) BP: 133/87 mmHg (08/09 1200)  Labs:  Basename 03/15/12 1245 03/15/12 0640 03/15/12 0500 03/14/12 1800 03/14/12 1059 03/14/12 0418 03/13/12 1405 03/13/12 0400  HGB -- -- 7.4* -- -- 8.0* -- --  HCT -- -- 21.7* -- -- 23.1* -- 25.4*  PLT -- -- 217 -- -- 234 -- 280  APTT -- -- 194* -- -- 75* 51* --  LABPROT -- -- -- -- -- -- -- --  INR -- -- -- -- -- -- -- --  HEPARINUNFRC 0.10* -- 0.17* <0.10* -- -- -- --  CREATININE -- 0.57 -- -- 0.54 0.57 -- --  CKTOTAL -- -- -- -- -- -- -- --  CKMB -- -- -- -- -- -- -- --  TROPONINI -- -- -- -- -- -- -- --    Estimated Creatinine Clearance: 99.4 ml/min (by C-G formula based on Cr of 0.57).   Assessment: 55 YOM was on heparin s/p R tibial embolectomy; HL remains < 0.1, despite heparin increased to ~ 37unit/kg/hr. 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 (75-120 nl), hgb dropped to 7.4 from 8, but no active bleeding, plts stable. Plan to start coumadin if ok with surgery. But pt. Is still NPO on TPN. discussed with VVS, will switch to lovenox since lovenox is less dependant on antithrombin for anticoagulation action. Also tried to reach dr. Johna Sheriff on starting IV coumadin, who is not available, on call Dr. Dewayne Hatch to hold for now, and Dr. Johna Sheriff to decide tomorrow.  Goal of Therapy:  Anti-Xa level 0.6-1.2 units/ml 4hrs after LMWH dose given Monitor platelets by anticoagulation protocol: Yes   Plan:  - D/C IV heparin - Lovenox 65mg  sq q12 hrs - consider checking anti-Xa level after 4 doses - Contact Dr. Johna Sheriff again tomorrow regarding IV coumadin  Bayard Hugger,  PharmD, BCPS  Clinical Pharmacist  Pager: [phone]  03/15/2012,2:33 PM

## 2012-03-15 NOTE — Progress Notes (Signed)
ANTICOAGULATION CONSULT NOTE - Follow Up Consult  Pharmacy Consult:  Heparin Indication:  +DVT, s/p embolectomy  Allergies  Allergen Reactions  . Mesalamine     REACTION: Rash    Patient Measurements: Height: 5\' 9"  (175.3 cm) Weight: 145 lb 1 oz (65.8 kg) IBW/kg (Calculated) : 70.7  Heparin Dosing Weight: 66 kg  Vital Signs: Temp: 99.3 F (37.4 C) (08/09 0400) Temp src: Oral (08/09 0400) BP: 115/86 mmHg (08/09 0024)  Labs:  Basename 03/15/12 0500 03/14/12 1800 03/14/12 1059 03/14/12 0418 03/13/12 1405 03/13/12 0400  HGB 7.4* -- -- 8.0* -- --  HCT 21.7* -- -- 23.1* -- 25.4*  PLT 217 -- -- 234 -- 280  APTT -- -- -- 75* 51* --  LABPROT -- -- -- -- -- --  INR -- -- -- -- -- --  HEPARINUNFRC 0.17* <0.10* <0.10* -- -- --  CREATININE -- -- 0.54 0.57 -- 0.75  CKTOTAL -- -- -- -- -- --  CKMB -- -- -- -- -- --  TROPONINI -- -- -- -- -- --    Estimated Creatinine Clearance: 99.4 ml/min (by C-G formula based on Cr of 0.54).   Assessment: 8 YOM s/p urgent right tibial embolectomy on 03/12/12 for ischemic right foot on IV heparin. Heparin level (0.17) is improved, but still below-goal. No problem with line / infusion per RN.   Goal of Therapy:  Heparin level 0.3-0.7 units/ml Monitor platelets by anticoagulation protocol: Yes    Plan:  1. Increase IV heparin to 2450 units/hr. 2. Heparin level in 6 hours.  3. Follow-up anticoagulation plan pending ATIII level.   Lorre Munroe, PharmD 03/15/2012 5:55 AM

## 2012-03-15 NOTE — Progress Notes (Signed)
Patient ID: Rick Edwards, male   DOB: 1956/03/19, 55 y.o.   MRN: [id number] 3 Days Post-Op  Subjective: Up in chair.  Mild abd and right foot pain, no other C/O  Objective: Vital signs in last 24 hours: Temp:  [97.5 F (36.4 C)-99.5 F (37.5 C)] 97.8 F (36.6 C) (08/09 0800) Resp:  [13-21] 19  (08/09 0800) BP: (115-169)/(83-99) 141/92 mmHg (08/09 0800) SpO2:  [92 %] 92 % (08/09 0024) Last BM Date: 03/11/12  Intake/Output from previous day: 08/08 0701 - 08/09 0700 In: 1591.3 [I.V.:931.3; NG/GT:50; TPN:610] Out: 1535 [Urine:1500; Drains:10; Stool:25] Intake/Output this shift: Total I/O In: 99.5 [I.V.:59.5; TPN:40] Out: 0   General appearance: alert, cooperative and no distress GI: abnormal findings:  mild tenderness in the entire abdomen and minimal distention, stoma healthy with some stool in bag Pulses: Right Pulses: , DP: present 2+ Incision/Wound: Packed open, dressing clean and dry  Lab Results:   Basename 03/15/12 0500 03/14/12 0418  WBC 11.8* 20.1*  HGB 7.4* 8.0*  HCT 21.7* 23.1*  PLT 217 234   BMET  Basename 03/15/12 0640 03/14/12 1059  NA 141 143  K 2.9* 3.0*  CL 109 111  CO2 28 26  GLUCOSE 125* 128*  BUN 4* 5*  CREATININE 0.57 0.54  CALCIUM 7.4* 7.6*     Studies/Results: No results found.  Anti-infectives: Anti-infectives     Start     Dose/Rate Route Frequency Ordered Stop   03/11/12 1930   ertapenem (INVANZ) 1 g in sodium chloride 0.9 % 50 mL IVPB        1 g 100 mL/hr over 30 Minutes Intravenous Every 24 hours 03/11/12 1835     03/11/12 0809   ertapenem (INVANZ) 1 g in sodium chloride 0.9 % 50 mL IVPB        1 g 100 mL/hr over 30 Minutes Intravenous 60 min pre-op 03/11/12 0809 03/11/12 1100          Assessment/Plan: s/p Procedure(s): EMBOLECTOMY INTRA OPERATIVE ARTERIOGRAM POD 4 subtotal colectomy and ileostomy, repair enterotomy Stable, WBC decreasing. Some GI function, will D/C NGT, NPO x ice chips FEN- on TNA, hypokalemia,  replacement ordered Short run V tach last night, prob secondary to electrolytes ID- cont abx until WBC normalizes and afebrile OK for coumadin  LOS: 4 days    Rick Edwards T 03/15/2012

## 2012-03-15 NOTE — Progress Notes (Addendum)
03/15/2012 0615  Pt had 20 beat run of V tach. BP 137/84. Pt is asymptomatic and resting VSS HR 90 NSR. Pt's Hgb 7.4 and Hct 21.7. MD informed that pt passed 25 cc of serosanguinous fluid through ileostomy. Dr. Biagio Quint notified. New orders received for BMET and Magnesium. Will continue to monitor.  [place] Keven Osborn, RN

## 2012-03-15 NOTE — Progress Notes (Addendum)
[facility] [place]  Progress Note Bypass Surgery  Date of Surgery: 03/11/2012 - 03/12/2012  Procedure(s): EMBOLECTOMY INTRA OPERATIVE ARTERIOGRAM Surgeon: Surgeon(s): Chuck Hint, MD  3 Days Post-Op  History of Present Illness  Rick Edwards is a 56 y.o. male who is S/P Procedure(s): EMBOLECTOMY INTRA OPERATIVE ARTERIOGRAM right.  The patient's pre-op symptoms of pain are Improved . Patients pain is fairly well controlled. Pt C/O pain in calf with motion. Probable foot drop- pt cannot dorsiflex foot.   VASC. LAB Studies:        ABI: Right 1.27;  Left 1.15;  Significant Diagnostic Studies: CBC Lab Results  Component Value Date   WBC 11.8* 03/15/2012   HGB 7.4* 03/15/2012   HCT 21.7* 03/15/2012   MCV 88.2 03/15/2012   PLT 217 03/15/2012    BMET     Component Value Date/Time   NA 141 03/15/2012 0640   K 2.9* 03/15/2012 0640   CL 109 03/15/2012 0640   CO2 28 03/15/2012 0640   GLUCOSE 125* 03/15/2012 0640   GLUCOSE 101 02/26/2008 1535   BUN 4* 03/15/2012 0640   CREATININE 0.57 03/15/2012 0640   CALCIUM 7.4* 03/15/2012 0640   GFRNONAA >90 03/15/2012 0640   GFRAA >90 03/15/2012 0640    COAG Lab Results  Component Value Date   INR 1.12 01/21/2010   No results found for this basename: PTT    Physical Examination  BP Readings from Last 3 Encounters:  03/15/12 134/83  03/15/12 134/83  03/15/12 134/83   Temp Readings from Last 3 Encounters:  03/15/12 97.8 F (36.6 C)   03/15/12 97.8 F (36.6 C)   03/15/12 97.8 F (36.6 C)    SpO2 Readings from Last 3 Encounters:  03/15/12 92%  03/15/12 92%  03/15/12 92%   Pulse Readings from Last 3 Encounters:  03/13/12 107  03/13/12 107  03/13/12 107    Pt is A&O x 3 right lower extremity: Incision/s is/are clean,dry.intact, and  healing without hematoma, erythema or drainage Limb is warm; with good color; toes cool Sensation intact.  Foot drop on right  Right Dorsalis Pedis pulse is  palpable   Assessment/Plan: Pt. Doing well Post-op pain is controlled Wounds are healing well PT/OT for ambulation - foot drop brace Continue wound care as ordered Poss begin coumadin if OK with Gen Surg.  Marlowe [place] [phone] 03/15/2012 7:41 AM  Pharmacy Recommends switching to Lovenox as pt has anti-thrombin 3 deficiency and will start coumadin DW Dr. Edilia Bo. OK for coumadin Per Dr Johna Sheriff note  Given AT-III deficiency, acute thrombotic event likely secondary to hypercoagulable state. Will need coumadin when taking po,  Tomi Grandpre S. Edilia Bo, MD, FACS Beeper [phone] 03/15/2012

## 2012-03-15 NOTE — Progress Notes (Signed)
Asymptomatic, 20 beat run of vtach.  sats okay, vitals okay.  HGB down to 7.4 will add electrolytes and observe

## 2012-03-15 NOTE — Progress Notes (Signed)
PARENTERAL NUTRITION CONSULT NOTE - Follow Up  Pharmacy Consult for TNA Indication: Severe pre-op malnutrition and expected post op ileus  Allergies  Allergen Reactions  . Mesalamine     REACTION: Rash    Patient Measurements: Height: 5\' 9"  (175.3 cm) Weight: 145 lb 1 oz (65.8 kg) IBW/kg (Calculated) : 70.7  Usual Weight: ~70 kg  Vital Signs: Temp: 97.8 F (36.6 C) (08/09 0800) Temp src: Oral (08/09 0800) BP: 141/92 mmHg (08/09 0800) Intake/Output from previous day: 08/08 0701 - 08/09 0700 In: 1591.3 [I.V.:931.3; NG/GT:50; TPN:610] Out: 1535 [Urine:1500; Drains:10; Stool:25] Intake/Output from this shift: Total I/O In: 99.5 [I.V.:59.5; TPN:40] Out: 0   Labs:  Basename 03/15/12 0500 03/14/12 0418 03/13/12 1405 03/13/12 0400  WBC 11.8* 20.1* -- 20.0*  HGB 7.4* 8.0* -- 8.4*  HCT 21.7* 23.1* -- 25.4*  PLT 217 234 -- 280  APTT 194* 75* 51* --  INR -- -- -- --     Basename 03/15/12 0640 03/14/12 1059 03/14/12 0418 03/13/12 0400  NA 141 143 142 --  K 2.9* 3.0* 3.1* --  CL 109 111 112 --  CO2 28 26 26  --  GLUCOSE 125* 128* 127* --  BUN 4* 5* 6 --  CREATININE 0.57 0.54 0.57 --  LABCREA -- -- -- --  CREAT24HRUR -- -- -- --  CALCIUM 7.4* 7.6* 7.5* --  MG 1.8 -- 2.0 --  PHOS -- -- 2.8 2.8  PROT -- -- 4.2* --  ALBUMIN -- -- 1.2* --  AST -- -- 17 --  ALT -- -- 15 --  ALKPHOS -- -- 78 --  BILITOT -- -- 0.3 --  BILIDIR -- -- -- --  IBILI -- -- -- --  PREALBUMIN -- -- -- --  TRIG -- -- -- --  CHOLHDL -- -- -- --  CHOL -- -- -- --   Estimated Creatinine Clearance: 99.4 ml/min (by C-G formula based on Cr of 0.57).    Basename 03/15/12 0033 03/14/12 1722 03/14/12 1555  GLUCAP 119* 122* 125*    Medical History: Past Medical History  Diagnosis Date  . Crohn's disease   . Perianal abscess 2001    s/p right hemicolectomy, and drainage of retroperitoneal abcess 2003  . Hypertension   . Hearing loss   . GI bleed 6/11    w/ normal EGD 6/11, 3 PRBCs   .  Anemia     anemia thrombocytopenia, saw Hematology 2011, can not r/o myeloproliferative d/c   CBGs & Insulin requirements past 24 hours:  1 unit SSI on sensitive SSI q6h  Nutritional Goals: RD recommendation: 9604-5409 kcal/day 75-94 g protein/day Goal is Clinimix 5/15 at 14ml/hr and IVFE 20% at 10 ml/hr on MWF = 96gm protein (1.5 g/kg/d), 1843 kCal on MWF, 1363 Kcal on non-lipid days  Current: Diet: NPO with NGT  Assessment:  26 YOM with severe Crohn's colitis of the right colon with perforation into the retroperitoneum and retroperitoneal abscess status post right hemicolectomy with anastomosis 10 years ago. Developed increased postprandial pain with 20 pound weight loss over the past month due to lack of appetite. CT shows massively dilated transverse colon with thickening and inflammatory change extending over to the left colon. He is now s/p partial colectomy with end ileostomy and repair of enterotomy on 03/11/2012.  PICC line ordered 8/5; TNA was to start 8/6 given severe pre-op malnutrition and expected post op ileus however, PICC not able to be placed as pt was in surgery 8/6 for urgent thrombectomy 2/2  ischemic R foot. PICC line placed 8/7  TPN infusing at 40 ml/hr  GI: s/p subtotal colectomy w/ end ileostmy and repair of enterotomy 8/5, TNA started for anticipation of post-op ileus Endo: no h/o DM, CBG at goal of <180 Lytes: corrected Ca = 9.64, K low, Phos stable, Mag 1.8 Renal: SCr stable, UOP good at 0.9 ml/kg/hr Hepatobil:  LFTs WNL, prealbumin low at 4.3, as expected Pulm: RA Neuro: AAO Cards: BP 141/92, runs of Vtach noted ID: afeb, WBC remain elevated, empiric ertapenem 8/5>> Best Practices: scds, mouthcare, heparin drip  Plan:  - Continue Clinimix E5/15 at 40 ml/h, increasing to goal once lytes stable - Provide available trace elements, MVI, Lipids at 10 ml/hr MWF only 2/2 national shortage - Continue sensitive SSI q6h - Replete K with 4k runs - Magnesium 2gm bolus  IV - f/u am BMP, cbgs  Rick Edwards, PharmD Clinical Pharmacist Pager: [phone] Pharmacy: [phone] 03/15/2012 9:07 AM

## 2012-03-16 LAB — CBC
MCH: 30.3 pg (ref 26.0–34.0)
MCHC: 33.8 g/dL (ref 30.0–36.0)
MCV: 89.6 fL (ref 78.0–100.0)
Platelets: 251 10*3/uL (ref 150–400)

## 2012-03-16 LAB — GLUCOSE, CAPILLARY
Glucose-Capillary: 116 mg/dL — ABNORMAL HIGH (ref 70–99)
Glucose-Capillary: 123 mg/dL — ABNORMAL HIGH (ref 70–99)
Glucose-Capillary: 102 mg/dL — ABNORMAL HIGH (ref 70–99)
Glucose-Capillary: 100 mg/dL — ABNORMAL HIGH (ref 70–99)
Glucose-Capillary: 113 mg/dL — ABNORMAL HIGH (ref 70–99)

## 2012-03-16 LAB — BASIC METABOLIC PANEL
CO2: 30 mEq/L (ref 19–32)
CO2: 30 mEq/L (ref 19–32)
Calcium: 7.5 mg/dL — ABNORMAL LOW (ref 8.4–10.5)
Calcium: 7.4 mg/dL — ABNORMAL LOW (ref 8.4–10.5)
Creatinine, Ser: 0.54 mg/dL (ref 0.50–1.35)
Creatinine, Ser: 0.51 mg/dL (ref 0.50–1.35)
GFR calc Af Amer: >90 mL/min (ref >90)
GFR calc non Af Amer: >90 mL/min (ref >90)
GFR calc non Af Amer: >90 mL/min (ref >90)
Glucose, Bld: 113 mg/dL — ABNORMAL HIGH (ref 70–99)
Sodium: 138 mEq/L (ref 135–145)
Sodium: 137 mEq/L (ref 135–145)

## 2012-03-16 LAB — TYPE AND SCREEN
ABO/RH(D): O POS
Antibody Screen: NEGATIVE
Unit division: 0

## 2012-03-16 LAB — MAGNESIUM: Magnesium: 1.9 mg/dL (ref 1.5–2.5)

## 2012-03-16 LAB — PHOSPHORUS: Phosphorus: 2.5 mg/dL (ref 2.3–4.6)

## 2012-03-16 MED ORDER — WARFARIN SODIUM 5 MG IV SOLR
5.0000 mg | Freq: Once | INTRAVENOUS | Status: AC
  Administered 2012-03-16: 5 mg via INTRAVENOUS
  Filled 2012-03-16: qty 2.5

## 2012-03-16 MED ORDER — CLINIMIX E/DEXTROSE (5/15) 5 % IV SOLN
INTRAVENOUS | Status: AC
  Administered 2012-03-16: 18:00:00 via INTRAVENOUS
  Filled 2012-03-16: qty 1000

## 2012-03-16 MED ORDER — WARFARIN - PHARMACIST DOSING INPATIENT
Freq: Every day | Status: DC
  Administered 2012-03-17: 18:00:00
  Administered 2012-03-18: 1

## 2012-03-16 MED ORDER — POTASSIUM CHLORIDE 10 MEQ/50ML IV SOLN
10.0000 meq | INTRAVENOUS | Status: AC
  Administered 2012-03-16 (×6): 10 meq via INTRAVENOUS
  Filled 2012-03-16: qty 150
  Filled 2012-03-16: qty 100
  Filled 2012-03-16: qty 50

## 2012-03-16 MED ORDER — POTASSIUM CHLORIDE 10 MEQ/100ML IV SOLN: 10.0000 meq | INTRAVENOUS | Status: DC

## 2012-03-16 NOTE — Progress Notes (Addendum)
4 Days Post-Op  Subjective: No complaints this am, right foot feels ok, no n/v  Objective: Vital signs in last 24 hours: Temp:  [99 F (37.2 C)-100.1 F (37.8 C)] 99.2 F (37.3 C) (08/10 0712) Pulse Rate:  [94-107] 94  (08/10 0712) Resp:  [16-28] 16  (08/10 0712) BP: (133-151)/(85-97) 138/89 mmHg (08/10 0712) SpO2:  [95 %-98 %] 95 % (08/10 0712) Last BM Date: 03/11/12  Intake/Output from previous day: 08/09 0701 - 08/10 0700 In: 2192 [I.V.:952; IV Piggyback:200; TPN:1040] Out: 2550 [Urine:2150; Stool:400] Intake/Output this shift: Total I/O In: 85 [I.V.:35; TPN:50] Out: 675 [Urine:600; Stool:75]  General appearance: no distress Resp: clear to auscultation bilaterally Cardio: regular rate and rhythm GI: approp tender wound clean, stoma pink, he has some gas and liquid stool in bag  Lab Results:   Basename 03/16/12 0449 03/15/12 0500  WBC 9.8 11.8*  HGB 7.0* 7.4*  HCT 20.7* 21.7*  PLT 251 217   BMET  Basename 03/16/12 0449 03/15/12 0640  NA 138 141  K 3.0* 2.9*  CL 104 109  CO2 30 28  GLUCOSE 113* 125*  BUN 4* 4*  CREATININE 0.54 0.57  CALCIUM 7.5* 7.4*   PT/INR  Basename 03/16/12 0449  LABPROT 15.8*  INR 1.23    Assessment/Plan: POD 4 subtotal colectomy and ileostomy, repair enterotomy , postop rle embolectomy 1. Cont iv pain control 2. pulm toilet oob 3. Cont telemetry in stepdown, replace k 4. On tna, will give some clears today 5. hct lower today, really asx but he may very well need transfusion, will hold today and recheck in am 6. Wbc normal today, temp of 100.1 yesterday, can stop abx if afebrile tomorrow per dr Johna Sheriff    LOS: 5 days    [facility] 03/16/2012

## 2012-03-16 NOTE — Progress Notes (Signed)
PARENTERAL NUTRITION CONSULT NOTE - Follow Up  Pharmacy Consult for TNA Indication: Severe pre-op malnutrition and expected post op ileus  Allergies  Allergen Reactions  . Mesalamine     REACTION: Rash    Patient Measurements: Height: 5\' 9"  (175.3 cm) Weight: 145 lb 1 oz (65.8 kg) IBW/kg (Calculated) : 70.7  Usual Weight: ~70 kg  Vital Signs: Temp: 99.2 F (37.3 C) (08/10 0712) Temp src: Oral (08/10 0712) BP: 138/89 mmHg (08/10 0712) Pulse Rate: 94  (08/10 0712) Intake/Output from previous day: 08/09 0701 - 08/10 0700 In: 2192 [I.V.:952; IV Piggyback:200; TPN:1040] Out: 2550 [Urine:2150; Stool:400] Intake/Output from this shift: Total I/O In: 85 [I.V.:35; TPN:50] Out: -   Labs:  Basename 03/16/12 0449 03/15/12 0500 03/14/12 0418 03/13/12 1405  WBC 9.8 11.8* 20.1* --  HGB 7.0* 7.4* 8.0* --  HCT 20.7* 21.7* 23.1* --  PLT 251 217 234 --  APTT -- 194* 75* 51*  INR 1.23 -- -- --     Basename 03/16/12 0449 03/15/12 0640 03/14/12 1059 03/14/12 0418  NA 138 141 143 --  K 3.0* 2.9* 3.0* --  CL 104 109 111 --  CO2 30 28 26  --  GLUCOSE 113* 125* 128* --  BUN 4* 4* 5* --  CREATININE 0.54 0.57 0.54 --  LABCREA -- -- -- --  CREAT24HRUR -- -- -- --  CALCIUM 7.5* 7.4* 7.6* --  MG 1.9 1.8 -- 2.0  PHOS 2.5 -- -- 2.8  PROT -- -- -- 4.2*  ALBUMIN -- -- -- 1.2*  AST -- -- -- 17  ALT -- -- -- 15  ALKPHOS -- -- -- 78  BILITOT -- -- -- 0.3  BILIDIR -- -- -- --  IBILI -- -- -- --  PREALBUMIN -- -- -- --  TRIG -- -- -- --  CHOLHDL -- -- -- --  CHOL -- -- -- --   Estimated Creatinine Clearance: 99.4 ml/min (by C-G formula based on Cr of 0.54).    Basename 03/16/12 0705 03/15/12 2334 03/15/12 1752  GLUCAP 123* 116* 125*    Medical History: Past Medical History  Diagnosis Date  . Crohn's disease   . Perianal abscess 2001    s/p right hemicolectomy, and drainage of retroperitoneal abcess 2003  . Hypertension   . Hearing loss   . GI bleed 6/11    w/ normal EGD  6/11, 3 PRBCs   . Anemia     anemia thrombocytopenia, saw Hematology 2011, can not r/o myeloproliferative d/c   CBGs & Insulin requirements past 24 hours:  1 unit SSI on sensitive SSI q6h  Nutritional Goals: RD recommendation: 1610-9604 kcal/day 75-94 g protein/day Goal is Clinimix 5/15 at 47ml/hr and IVFE 20% at 10 ml/hr on MWF = 96gm protein (1.5 g/kg/d), 1843 kCal on MWF, 1363 Kcal on non-lipid days  Current Diet: NPO with NGT TPN infusing at 40 ml/hr  Assessment:  89 YOM with severe Crohn's colitis of the right colon with perforation into the retroperitoneum and retroperitoneal abscess status post right hemicolectomy with anastomosis 10 years ago. Developed increased postprandial pain with 20 pound weight loss over the past month due to lack of appetite. CT shows massively dilated transverse colon with thickening and inflammatory change extending over to the left colon. He is now s/p partial colectomy with end ileostomy and repair of enterotomy on 03/11/2012.  PICC line ordered 8/5; TNA was to start 8/6 given severe pre-op malnutrition and expected post op ileus however, PICC not able to be  placed as pt was in surgery 8/6 for urgent thrombectomy 2/2 ischemic R foot. PICC line placed 8/7.  GI: s/p subtotal colectomy w/ end ileostmy and repair of enterotomy 8/5, TNA started for anticipation of post-op ileus; 350 cc stool noted Endo: no h/o DM, CBG at goal of <180 Lytes: corrected Ca = 9.7, K remains low s/p 4 k runs yesterday, Mag/Phos WNL Renal: SCr stable, UOP good at 1.4 ml/kg/hr Hepatobil:  LFTs WNL, prealbumin low at 4.3, as expected Pulm: RA Neuro: AAO Cards: hypertensive, runs of Vtach noted 8/8 pm ID: afeb, WBC trending down- now WNL, empiric ertapenem 8/5>> Best Practices: scds, mouthcare, full dose lovenox  Plan:  - Continue Clinimix E5/15 at 40 ml/h - unable to advance to goal due to consisnetly low K despite repletion; plan on increasing to goal once lytes stable - Provide  available trace elements, MVI, Lipids at 10 ml/hr MWF only 2/2 national shortage - Continue sensitive SSI q6h - Replete K with 6k runs - f/u am BMP, cbgs  Rick Edwards L. Illene Bolus, PharmD, BCPS Clinical Pharmacist Pager: [phone] Pharmacy: [phone] 03/16/2012 9:22 AM

## 2012-03-16 NOTE — Progress Notes (Signed)
ANTICOAGULATION CONSULT NOTE - Initial Consult  Pharmacy Consult for coumadin IV Indication: +DVT, s/p embolectomy  Allergies  Allergen Reactions  . Mesalamine     REACTION: Rash    Patient Measurements: Height: 5\' 9"  (175.3 cm) Weight: 145 lb 1 oz (65.8 kg) IBW/kg (Calculated) : 70.7   Vital Signs: Temp: 99.2 F (37.3 C) (08/10 1212) Temp src: Oral (08/10 1212) BP: 136/88 mmHg (08/10 1212) Pulse Rate: 92  (08/10 1212)  Labs:  Basename 03/16/12 1100 03/16/12 0449 03/15/12 1245 03/15/12 0640 03/15/12 0500 03/14/12 1800 03/14/12 0418 03/13/12 1405  HGB -- 7.0* -- -- 7.4* -- -- --  HCT -- 20.7* -- -- 21.7* -- 23.1* --  PLT -- 251 -- -- 217 -- 234 --  APTT -- -- -- -- 194* -- 75* 51*  LABPROT -- 15.8* -- -- -- -- -- --  INR -- 1.23 -- -- -- -- -- --  HEPARINUNFRC -- -- 0.10* -- 0.17* <0.10* -- --  CREATININE 0.51 0.54 -- 0.57 -- -- -- --  CKTOTAL -- -- -- -- -- -- -- --  CKMB -- -- -- -- -- -- -- --  TROPONINI -- -- -- -- -- -- -- --    Estimated Creatinine Clearance: 99.4 ml/min (by C-G formula based on Cr of 0.51).   Medical History: Past Medical History  Diagnosis Date  . Crohn's disease   . Perianal abscess 2001    s/p right hemicolectomy, and drainage of retroperitoneal abcess 2003  . Hypertension   . Hearing loss   . GI bleed 6/11    w/ normal EGD 6/11, 3 PRBCs   . Anemia     anemia thrombocytopenia, saw Hematology 2011, can not r/o myeloproliferative d/c    Medications:  Scheduled:    . antiseptic oral rinse  15 mL Mouth Rinse q12n4p  . chlorhexidine  15 mL Mouth Rinse BID  . enoxaparin (LOVENOX) injection  65 mg Subcutaneous Q12H  . ertapenem (INVANZ) IV  1 g Intravenous Q24H  . insulin aspart  0-9 Units Subcutaneous Q6H  . potassium chloride  10 mEq Intravenous Q1 Hr x 4  . potassium chloride  10 mEq Intravenous Q1 Hr x 6  . sodium chloride  10-40 mL Intracatheter Q12H  . DISCONTD: potassium chloride  10 mEq Intravenous Q1 Hr x 4     Assessment: 56 y.o male was on hearin for s/p R tibial embolectomy. HL remained subtheraputic  With ATIII deficiency.heparin switched to lovenox since less dependant on AT for anticoagulation. Pt is NPO and recv'd ok  Per MD to initiate IV warfarin.  Goal of Therapy:  INR 2-3 Monitor platelets by anticoagulation protocol: Yes   Plan:  1. Initiate warfarin 5 mg IV 2. Follow up INR in AM 3. Monitor CBC and s/sx of bleeding 4. Continue  lovenox 65 mg Shaniko 12h 5. Consider checking anti XA level after 4 doses  Bola A. Wandra Feinstein D Clinical Pharmacist Pager:[phone] Phone [phone] 03/16/2012 1:27 PM

## 2012-03-16 NOTE — Progress Notes (Addendum)
[facility] Progress Note  03/16/2012 12:07 PM POD 4  Subjective:  Wants boot off.  Tm 100.1  Now 99.2   130s-150's systolic    Filed Vitals:   03/16/12 0712  BP: 138/89  Pulse: 94  Temp: 99.2 F (37.3 C)  Resp: 16    Physical Exam: Incisions:  C/d/i-drain site without drainage. Extremities:  Right foot warm;  + palpable right DP; sensation in tact.  Has some movement of his toes on the right.  CBC    Component Value Date/Time   WBC 9.8 03/16/2012 0449   WBC 4.5 10/17/2011 0804   RBC 2.31* 03/16/2012 0449   RBC 3.78* 10/17/2011 0804   HGB 7.0* 03/16/2012 0449   HGB 12.2* 10/17/2011 0804   HCT 20.7* 03/16/2012 0449   HCT 36.8* 10/17/2011 0804   PLT 251 03/16/2012 0449   PLT 523* 10/17/2011 0804   MCV 89.6 03/16/2012 0449   MCV 97.2 10/17/2011 0804   MCH 30.3 03/16/2012 0449   MCH 32.2 10/17/2011 0804   MCHC 33.8 03/16/2012 0449   MCHC 33.1 10/17/2011 0804   RDW 17.1* 03/16/2012 0449   RDW 15.7* 10/17/2011 0804   LYMPHSABS 0.5* 03/12/2012 0325   LYMPHSABS 0.9 10/17/2011 0804   MONOABS 0.7 03/12/2012 0325   MONOABS 0.7 10/17/2011 0804   EOSABS 0.0 03/12/2012 0325   EOSABS 0.1 10/17/2011 0804   BASOSABS 0.0 03/12/2012 0325   BASOSABS 0.0 10/17/2011 0804    BMET    Component Value Date/Time   NA 137 03/16/2012 1100   K 3.2* 03/16/2012 1100   CL 102 03/16/2012 1100   CO2 30 03/16/2012 1100   GLUCOSE 107* 03/16/2012 1100   GLUCOSE 101 02/26/2008 1535   BUN 4* 03/16/2012 1100   CREATININE 0.51 03/16/2012 1100   CALCIUM 7.4* 03/16/2012 1100   GFRNONAA >90 03/16/2012 1100   GFRAA >90 03/16/2012 1100    INR    Component Value Date/Time   INR 1.23 03/16/2012 0449     Intake/Output Summary (Last 24 hours) at 03/16/12 1207 Last data filed at 03/16/12 1053  Gross per 24 hour  Intake 1629.5 ml  Output   2775 ml  Net -1145.5 ml     Assessment/Plan:  56 y.o. male is s/p  1. Right popliteal and tibial peroneal trunk embolectomy with vein patch angioplasty  2.  Intraoperative arteriogram  3. Right tibial peroneal trunk and posterior tibial artery embolectomy with vein patch angioplasty   POD 4  -Hgb down today-Gen surg to recheck in am and will transfuse as needed. -hypokalemia-being supplemented. -mobilization -coumadin when pt taking po's  Doreatha Massed, PA-C [facility] [phone] 03/16/2012 12:07 PM  Agree with above. Palpable right DP pulse. On heparin. To start IV Coumadin per pharmacy.  Di Kindle. Edilia Bo, MD, FACS Beeper [phone] 03/16/2012

## 2012-03-16 NOTE — Evaluation (Signed)
Physical Therapy Evaluation Patient Details Name: Rick Edwards MRN: [id number] DOB: September 20, 1955 Today's Date: 03/16/2012 Time: 7829-5621 PT Time Calculation (min): 24 min  PT Assessment / Plan / Recommendation Clinical Impression  Pt s/p ileostomy and RLE embolectomy with dependencies in mobility due to problems below.     PT Assessment  Patient needs continued PT services    Follow Up Recommendations  Home health PT;Supervision/Assistance - 24 hour (may need post-acute rehab if progress slow (due to steps))    Barriers to Discharge Inaccessible home environment 3-5 steps to enter either home with no rails    Equipment Recommendations  Other (comment) (PT-TBA)    Recommendations for Other Services OT consult   Frequency Min 4X/week    Precautions / Restrictions Precautions Precautions: Fall   Pertinent Vitals/Pain incr RLE pain noted by facial expressions, however pt is reporting no pai.      Mobility  Bed Mobility Bed Mobility: Supine to Sit;Sitting - Scoot to Edge of Bed Supine to Sit: 4: Min assist;With rails;HOB elevated Sitting - Scoot to [place] of Bed: 6: Modified independent (Device/Increase time) Details for Bed Mobility Assistance: Pt able to slowly maneuver his legs over EOB, however needed assist to try to raise torso due to abd pain. Attempted to teach rolling onto his side, however pt too painful to turn over Transfers Transfers: Sit to Stand;Stand to Sit Sit to Stand: 4: Min assist;From elevated surface;With upper extremity assist;From bed Stand to Sit: 4: Min guard;With upper extremity assist;With armrests;To chair/3-in-1 Details for Transfer Assistance: Pt unable to push from seated surface and allowed use of RW with PT stabilizing.   Ambulation/Gait Ambulation/Gait Assistance: 4: Min assist Ambulation Distance (Feet): 3 Feet Assistive device: Rolling walker Ambulation/Gait Assistance Details: Very short stride length with RLE. Encouraged upright  posture as pt tends to flex due to abd pain.  Gait Pattern: Decreased stride length;Step-to pattern;Right foot flat;Antalgic    Exercises     PT Diagnosis: Difficulty walking;Acute pain  PT Problem List: Decreased range of motion;Decreased balance;Decreased mobility;Decreased knowledge of use of DME;Decreased knowledge of precautions;Pain PT Treatment Interventions: DME instruction;Gait training;Stair training;Functional mobility training;Therapeutic activities;Therapeutic exercise;Patient/family education   PT Goals Acute Rehab PT Goals PT Goal Formulation: With patient Time For Goal Achievement: 03/23/12 Potential to Achieve Goals: Good Pt will go Sit to Supine/Side: with modified independence;with HOB 0 degrees PT Goal: Sit to Supine/Side - Progress: Goal set today Pt will go Sit to Stand: with supervision;with upper extremity assist PT Goal: Sit to Stand - Progress: Goal set today Pt will Ambulate: 51 - 150 feet;with supervision;with least restrictive assistive device PT Goal: Ambulate - Progress: Goal set today Pt will Go Up / Down Stairs: 3-5 stairs;with min assist;with least restrictive assistive device PT Goal: Up/Down Stairs - Progress: Goal set today Pt will Perform Home Exercise Program: with supervision, verbal cues required/provided PT Goal: Perform Home Exercise Program - Progress: Goal set today  Visit Information  Last PT Received On: 03/16/12 Assistance Needed: +1    Subjective Data  Subjective: Very soft spoken Patient Stated Goal: return home with girlfriend   Prior Functioning  Home Living Lives With: Significant other Available Help at Discharge: Family Type of Home: House Home Access: Stairs to enter Secretary/administrator of Steps: 5 Entrance Stairs-Rails: None Home Layout: One level Home Adaptive Equipment: None Additional Comments: states he may go to his mother's house with 3 steps to enter, also no rails Prior Function Level of Independence:  Independent Communication Communication: No  difficulties    Cognition  Overall Cognitive Status: Appears within functional limits for tasks assessed/performed Arousal/Alertness: Awake/alert Orientation Level: Appears intact for tasks assessed Behavior During Session: [name] for tasks performed    Extremity/Trunk Assessment Right Upper Extremity Assessment RUE ROM/Strength/Tone: [name] for tasks assessed Left Upper Extremity Assessment LUE ROM/Strength/Tone: WFL for tasks assessed Right Lower Extremity Assessment RLE ROM/Strength/Tone: Deficits;Due to pain RLE ROM/Strength/Tone Deficits: foot drop; able to extend toes 2+/5; DF/PF 0/5;  Left Lower Extremity Assessment LLE ROM/Strength/Tone: [name] for tasks assessed Trunk Assessment Trunk Assessment: [name]   Balance    End of Session PT - End of Session Equipment Utilized During Treatment: Gait belt Activity Tolerance: Patient limited by pain Patient left: in chair;with call bell/phone within reach Nurse Communication: Mobility status  GP     Rick Edwards 03/16/2012, 4:09 PM  Pager [phone]

## 2012-03-17 LAB — BASIC METABOLIC PANEL
BUN: 4 mg/dL — ABNORMAL LOW (ref 6–23)
BUN: 4 mg/dL — ABNORMAL LOW (ref 6–23)
BUN: 4 mg/dL — ABNORMAL LOW (ref 6–23)
CO2: 28 mEq/L (ref 19–32)
CO2: 28 mEq/L (ref 19–32)
CO2: 31 mEq/L (ref 19–32)
Calcium: 7.5 mg/dL — ABNORMAL LOW (ref 8.4–10.5)
Calcium: 7.8 mg/dL — ABNORMAL LOW (ref 8.4–10.5)
Chloride: 99 mEq/L (ref 96–112)
Chloride: 100 mEq/L (ref 96–112)
Chloride: 100 mEq/L (ref 96–112)
Creatinine, Ser: 0.51 mg/dL (ref 0.50–1.35)
Creatinine, Ser: 0.47 mg/dL — ABNORMAL LOW (ref 0.50–1.35)
Creatinine, Ser: 0.55 mg/dL (ref 0.50–1.35)
GFR calc Af Amer: >90 mL/min (ref >90)
GFR calc Af Amer: >90 mL/min (ref >90)
Glucose, Bld: 110 mg/dL — ABNORMAL HIGH (ref 70–99)
Potassium: 4.2 mEq/L (ref 3.5–5.1)

## 2012-03-17 LAB — CBC WITH DIFFERENTIAL/PLATELET
Basophils Relative: 0 % (ref 0–1)
Eosinophils Relative: 1 % (ref 0–5)
HCT: 24.6 % — ABNORMAL LOW (ref 39.0–52.0)
Hemoglobin: 8.4 g/dL — ABNORMAL LOW (ref 13.0–17.0)
MCHC: 34.1 g/dL (ref 30.0–36.0)
MCV: 87.2 fL (ref 78.0–100.0)
Monocytes Absolute: 0.9 10*3/uL (ref 0.1–1.0)
Monocytes Relative: 7 % (ref 3–12)
Neutro Abs: 10 10*3/uL — ABNORMAL HIGH (ref 1.7–7.7)
RDW: 16.5 % — ABNORMAL HIGH (ref 11.5–15.5)

## 2012-03-17 LAB — PREPARE RBC (CROSSMATCH)

## 2012-03-17 LAB — GLUCOSE, CAPILLARY: Glucose-Capillary: 116 mg/dL — ABNORMAL HIGH (ref 70–99)

## 2012-03-17 LAB — CBC
HCT: 19.7 % — ABNORMAL LOW (ref 39.0–52.0)
Hemoglobin: 6.6 g/dL — CL (ref 13.0–17.0)
MCH: 30.3 pg (ref 26.0–34.0)
MCHC: 33.5 g/dL (ref 30.0–36.0)
RBC: 2.18 MIL/uL — ABNORMAL LOW (ref 4.22–5.81)

## 2012-03-17 LAB — PROTIME-INR
INR: 1.09 (ref 0.00–1.49)
Prothrombin Time: 14.3 seconds (ref 11.6–15.2)

## 2012-03-17 MED ORDER — CLINIMIX E/DEXTROSE (5/15) 5 % IV SOLN
INTRAVENOUS | Status: AC
  Administered 2012-03-17: 17:00:00 via INTRAVENOUS
  Filled 2012-03-17: qty 1000

## 2012-03-17 MED ORDER — WARFARIN SODIUM 5 MG IV SOLR
5.0000 mg | Freq: Once | INTRAVENOUS | Status: AC
  Administered 2012-03-17: 5 mg via INTRAVENOUS
  Filled 2012-03-17: qty 2.5

## 2012-03-17 MED ORDER — COUMADIN BOOK
Freq: Once | Status: AC
  Administered 2012-03-17: 1
  Filled 2012-03-17: qty 1

## 2012-03-17 MED ORDER — WARFARIN VIDEO
Freq: Once | Status: AC
Start: 2012-03-17 — End: 2012-03-17
  Administered 2012-03-17: 20:00:00

## 2012-03-17 NOTE — Progress Notes (Signed)
03/17/2012 3:42 PM Second unit of blood has been completed. Vss. Will continue to monitor. Rick Edwards

## 2012-03-17 NOTE — Progress Notes (Addendum)
ANTICOAGULATION CONSULT NOTE - Follow Up Consult  Pharmacy Consult for IV coumadin/lovenox Indication: +DVT, s/p embolectomy   Allergies  Allergen Reactions  . Mesalamine     REACTION: Rash    Patient Measurements: Height: 5\' 9"  (175.3 cm) Weight: 145 lb 1 oz (65.8 kg) IBW/kg (Calculated) : 70.7    Vital Signs: Temp: 98.7 F (37.1 C) (08/11 0900) Temp src: Oral (08/11 0900) BP: 151/85 mmHg (08/11 0900) Pulse Rate: 97  (08/11 0900)  Labs:  Basename 03/17/12 0359 03/16/12 1100 03/16/12 0449 03/15/12 1245 03/15/12 0500 03/14/12 1800  HGB 6.6* -- 7.0* -- -- --  HCT 19.7* -- 20.7* -- 21.7* --  PLT 308 -- 251 -- 217 --  APTT -- -- -- -- 194* --  LABPROT 14.3 -- 15.8* -- -- --  INR 1.09 -- 1.23 -- -- --  HEPARINUNFRC -- -- -- 0.10* 0.17* <0.10*  CREATININE 0.51 0.51 0.54 -- -- --  CKTOTAL -- -- -- -- -- --  CKMB -- -- -- -- -- --  TROPONINI -- -- -- -- -- --    Estimated Creatinine Clearance: 99.4 ml/min (by C-G formula based on Cr of 0.51).   Medications:  Scheduled:    . antiseptic oral rinse  15 mL Mouth Rinse q12n4p  . chlorhexidine  15 mL Mouth Rinse BID  . enoxaparin (LOVENOX) injection  65 mg Subcutaneous Q12H  . ertapenem (INVANZ) IV  1 g Intravenous Q24H  . insulin aspart  0-9 Units Subcutaneous Q6H  . potassium chloride  10 mEq Intravenous Q1 Hr x 6  . sodium chloride  10-40 mL Intracatheter Q12H  . warfarin  5 mg Intravenous ONCE-1800  . Warfarin - Pharmacist Dosing Inpatient   Does not apply q1800  . DISCONTD: potassium chloride  10 mEq Intravenous Q1 Hr x 4    Assessment:  56 y.o male was on heparin for s/p R tibial embolectomy. HL remained subtherapeutic despite increasing dose. 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 Heparin was switched to lovenox since less dependant on AT for anticoagulation. Pt is NPO and recv'd ok per MD to initiate IV warfarin. Noted H/H trend down, per RN no noted bleeding, likely due to post-surgical  anemia per MD note. Will continue to monitor. INR today subtherapeutic at 1.09 after 5 mg IV warfarin.  Overlap day 2/5.  Goal of Therapy:  INR 2-3 Monitor platelets by anticoagulation protocol: Yes   Plan:  1. Continue 5mg  IV Warfarin 2. Continue Lovenox 65 mg q12h 3. F/u INR in AM 4. Monitor s/sx of bleeding  Bola A. Wandra Feinstein D Clinical Pharmacist [phone] Phone [phone] 03/17/2012 10:14 AM

## 2012-03-17 NOTE — Progress Notes (Addendum)
[facility] Progress Note  03/17/2012 8:11 AM POD 5  Subjective:  Still having pain in right foot  Tm 99.2  Now afebrile Filed Vitals:   03/17/12 0800  BP: 149/92  Pulse: 94  Temp: 98.8 F (37.1 C)  Resp: 20    Physical Exam: Extremities:  + palpable right DP pulse.  Has movement of toes, but otherwise, movement is limited in right foot.  CBC    Component Value Date/Time   WBC 10.4 03/17/2012 0359   WBC 4.5 10/17/2011 0804   RBC 2.18* 03/17/2012 0359   RBC 3.78* 10/17/2011 0804   HGB 6.6* 03/17/2012 0359   HGB 12.2* 10/17/2011 0804   HCT 19.7* 03/17/2012 0359   HCT 36.8* 10/17/2011 0804   PLT 308 03/17/2012 0359   PLT 523* 10/17/2011 0804   MCV 90.4 03/17/2012 0359   MCV 97.2 10/17/2011 0804   MCH 30.3 03/17/2012 0359   MCH 32.2 10/17/2011 0804   MCHC 33.5 03/17/2012 0359   MCHC 33.1 10/17/2011 0804   RDW 16.7* 03/17/2012 0359   RDW 15.7* 10/17/2011 0804   LYMPHSABS 0.5* 03/12/2012 0325   LYMPHSABS 0.9 10/17/2011 0804   MONOABS 0.7 03/12/2012 0325   MONOABS 0.7 10/17/2011 0804   EOSABS 0.0 03/12/2012 0325   EOSABS 0.1 10/17/2011 0804   BASOSABS 0.0 03/12/2012 0325   BASOSABS 0.0 10/17/2011 0804    BMET    Component Value Date/Time   NA 131* 03/17/2012 0359   K 4.2 03/17/2012 0359   CL 99 03/17/2012 0359   CO2 28 03/17/2012 0359   GLUCOSE 338* 03/17/2012 0359   GLUCOSE 101 02/26/2008 1535   BUN 4* 03/17/2012 0359   CREATININE 0.51 03/17/2012 0359   CALCIUM 7.5* 03/17/2012 0359   GFRNONAA >90 03/17/2012 0359   GFRAA >90 03/17/2012 0359    INR    Component Value Date/Time   INR 1.09 03/17/2012 0359     Intake/Output Summary (Last 24 hours) at 03/17/12 0811 Last data filed at 03/17/12 0645  Gross per 24 hour  Intake 1087.5 ml  Output   2775 ml  Net -1687.5 ml     Assessment/Plan:  56 y.o. male is s/p 1. Right popliteal and tibial peroneal trunk embolectomy with vein patch angioplasty  2. Intraoperative arteriogram  3. Right tibial peroneal trunk and posterior  tibial artery embolectomy with vein patch angioplasty  POD 5  -surgical anemia-transfuse per gen surgery -IV coumadin per pharmacy. Continue heparin.   Doreatha Massed, PA-C [facility] [phone] 03/17/2012 8:11 AM  Agree with above. Palpable right DP pulse. Pt is on Lovenox and Coumadin per pharmacy. Pt. has ATIII deficiency with antithrombin level = 42 (normal = 75-120). This is likely the etiology of his acute arterial occlusion on the right. Will likely need lifetime coumadin.   Di Kindle. Edilia Bo, MD, FACS Beeper [phone] 03/17/2012

## 2012-03-17 NOTE — Progress Notes (Addendum)
PARENTERAL NUTRITION CONSULT NOTE - Follow Up  Pharmacy Consult for TNA Indication: Severe pre-op malnutrition and expected post op ileus  Allergies  Allergen Reactions  . Mesalamine     REACTION: Rash    Patient Measurements: Height: 5\' 9"  (175.3 cm) Weight: 145 lb 1 oz (65.8 kg) IBW/kg (Calculated) : 70.7  Usual Weight: ~70 kg  Vital Signs: Temp: 98.7 F (37.1 C) (08/11 0900) Temp src: Oral (08/11 0900) BP: 151/85 mmHg (08/11 0900) Pulse Rate: 97  (08/11 0900) Intake/Output from previous day: 08/10 0701 - 08/11 0700 In: 1247.5 [I.V.:705; Blood:12.5; TPN:530] Out: 2775 [Urine:2475; Stool:300] Intake/Output from this shift: Total I/O In: 150 [I.V.:70; TPN:80] Out: 650 [Urine:650]  Labs:  [facility] 03/17/12 0359 03/16/12 0449 03/15/12 0500  WBC 10.4 9.8 11.8*  HGB 6.6* 7.0* 7.4*  HCT 19.7* 20.7* 21.7*  PLT 308 251 217  APTT -- -- 194*  INR 1.09 1.23 --     Basename 03/17/12 0359 03/16/12 1100 03/16/12 0449 03/15/12 0640  NA 131* 137 138 --  K 4.2 3.2* 3.0* --  CL 99 102 104 --  CO2 28 30 30  --  GLUCOSE 338* 107* 113* --  BUN 4* 4* 4* --  CREATININE 0.51 0.51 0.54 --  LABCREA -- -- -- --  CREAT24HRUR -- -- -- --  CALCIUM 7.5* 7.4* 7.5* --  MG -- -- 1.9 1.8  PHOS -- -- 2.5 --  PROT -- -- -- --  ALBUMIN -- -- -- --  AST -- -- -- --  ALT -- -- -- --  ALKPHOS -- -- -- --  BILITOT -- -- -- --  BILIDIR -- -- -- --  IBILI -- -- -- --  PREALBUMIN -- -- -- --  TRIG -- -- -- --  CHOLHDL -- -- -- --  CHOL -- -- -- --   Estimated Creatinine Clearance: 99.4 ml/min (by C-G formula based on Cr of 0.51).    Basename 03/17/12 0444 03/16/12 2324 03/16/12 1825  GLUCAP 111* 113* 100*    Medical History: Past Medical History  Diagnosis Date  . Crohn's disease   . Perianal abscess 2001    s/p right hemicolectomy, and drainage of retroperitoneal abcess 2003  . Hypertension   . Hearing loss   . GI bleed 6/11    w/ normal EGD 6/11, 3 PRBCs   . Anemia    anemia thrombocytopenia, saw Hematology 2011, can not r/o myeloproliferative d/c   CBGs & Insulin requirements past 24 hours:  1 unit SSI on sensitive SSI q6h  Nutritional Goals: RD recommendation: 1610-9604 kcal/day 75-94 g protein/day Goal is Clinimix 5/15 at 73ml/hr and IVFE 20% at 10 ml/hr on MWF = 96gm protein (1.5 g/kg/d), 1843 kCal on MWF, 1363 Kcal on non-lipid days  Current Diet: NPO with NGT TPN infusing at 40 ml/hr  Assessment:  53 YOM with severe Crohn's colitis of the right colon with perforation into the retroperitoneum and retroperitoneal abscess status post right hemicolectomy with anastomosis 10 years ago. Developed increased postprandial pain with 20 pound weight loss over the past month due to lack of appetite. CT shows massively dilated transverse colon with thickening and inflammatory change extending over to the left colon. He is now s/p partial colectomy with end ileostomy and repair of enterotomy on 03/11/2012.  PICC line ordered 8/5; TNA was to start 8/6 given severe pre-op malnutrition and expected post op ileus however, PICC not able to be placed as pt was in surgery 8/6 for urgent thrombectomy 2/2  ischemic R foot. PICC line placed 8/7.  Overnight events: bmp this am appears makedly different than trends.  K increased significantly - higher than anticipated s/p repletion yesterday.  Also noted that glucose appears elevated compared to cbg.  Suspect labs this am may be contaminated with TNA.  Would like to redraw, but cannot at this time as patient is receiving blood.  GI: s/p subtotal colectomy w/ end ileostmy and repair of enterotomy 8/5, TNA started for anticipation of post-op ileus; 350 cc stool noted; clear liquid diet ordered 8/10 Endo: no h/o DM, CBG at goal of <180 Lytes: corrected Ca = 9.7 Renal: SCr stable, UOP good at 1.6 ml/kg/hr Hepatobil:  LFTs WNL, prealbumin low at 4.3, as expected Pulm: RA Neuro: AAO Cards: hypertensive, runs of Vtach noted 8/8 pm ID:  afeb, WBC trending down- now WNL, empiric ertapenem 8/5>> Best Practices: scds, mouthcare, full dose lovenox  Plan:  - Continue Clinimix E5/15 at 40 ml/h - unwilling to advance to goal until lytes verified by repeat bmp but unable to draw BMP now as pt is currently receiving blood - Check repeat BMP after transfusion ends - Provide available trace elements, MVI, Lipids at 10 ml/hr MWF only 2/2 national shortage - Continue sensitive SSI q6h - f/u am TNA labs, CBGs - f/u toleration of clear liquid diet  Lorrie Strauch L. Illene Bolus, PharmD, BCPS Clinical Pharmacist Pager: [phone] Pharmacy: [phone] 03/17/2012 9:15 AM     Addendum: repeat BMP reveals glucose WNL at 110 and K at 3.9.  Continue TPN as noted above.  Plan to advance to goal tomorrow if still requiring TNA.  Maybree Riling L. Illene Bolus, PharmD, BCPS Clinical Pharmacist Pager: [phone] Pharmacy: [phone] 03/17/2012 12:06 PM

## 2012-03-17 NOTE — Progress Notes (Signed)
CRITICAL VALUE ALERT  Critical value received:  Hemoglobin 6.6  Date of notification:  03/17/12  Time of notification:  0421  Critical value read back:yes  Nurse who received alert:  B. Katrinka Blazing  MD notified (1st page):  Dr. Dwain Sarna  Time of first page:  0430  MD notified (2nd page):  Time of second page:  Responding MD:  Dr. Dwain Sarna  Time MD responded:  [phone]

## 2012-03-17 NOTE — Progress Notes (Signed)
5 Days Post-Op  Subjective: Has some right foot pain Large amount of gas in ileostomy bag Poor appetite  Objective: Vital signs in last 24 hours: Temp:  [98.1 F (36.7 C)-99.2 F (37.3 C)] 98.9 F (37.2 C) (08/11 1102) Pulse Rate:  [81-100] 81  (08/11 0945) Resp:  [19-26] 23  (08/11 0945) BP: (134-151)/(85-95) 140/95 mmHg (08/11 0945) SpO2:  [97 %-99 %] 99 % (08/11 0340) Last BM Date: 03/11/12  Intake/Output from previous day: 08/10 0701 - 08/11 0700 In: 1247.5 [I.V.:705; Blood:12.5; TPN:530] Out: 2775 [Urine:2475; Stool:300] Intake/Output this shift: Total I/O In: 150 [I.V.:70; TPN:80] Out: 650 [Urine:650]  General appearance: alert, cooperative and no distress GI: soft, incisional tenderness Ostomy - pink; large amount of gas, some stool  Lab Results:   [facility] 03/17/12 0359 03/16/12 0449  WBC 10.4 9.8  HGB 6.6* 7.0*  HCT 19.7* 20.7*  PLT 308 251   BMET  Basename 03/17/12 0359 03/16/12 1100  NA 131* 137  K 4.2 3.2*  CL 99 102  CO2 28 30  GLUCOSE 338* 107*  BUN 4* 4*  CREATININE 0.51 0.51  CALCIUM 7.5* 7.4*   PT/INR  Basename 03/17/12 0359 03/16/12 0449  LABPROT 14.3 15.8*  INR 1.09 1.23   ABG No results found for this basename: PHART:2,PCO2:2,PO2:2,HCO3:2 in the last 72 hours  Studies/Results: No results found.  Anti-infectives: Anti-infectives     Start     Dose/Rate Route Frequency Ordered Stop   03/11/12 1930   ertapenem (INVANZ) 1 g in sodium chloride 0.9 % 50 mL IVPB        1 g 100 mL/hr over 30 Minutes Intravenous Every 24 hours 03/11/12 1835     03/11/12 0809   ertapenem (INVANZ) 1 g in sodium chloride 0.9 % 50 mL IVPB        1 g 100 mL/hr over 30 Minutes Intravenous 60 min pre-op 03/11/12 0809 03/11/12 1100          Assessment/Plan: s/p Procedure(s) (LRB): EMBOLECTOMY (Right) INTRA OPERATIVE ARTERIOGRAM (Right) Acute blood loss anemia - transfuse today.  LOS: 6 days    Chisa Kushner K. 03/17/2012

## 2012-03-17 NOTE — Progress Notes (Signed)
03/17/2012 0945 Blood transfusion complete, vss, no complications. Will continue to monitor patient Rick Edwards

## 2012-03-17 NOTE — Clinical Social Work Psychosocial (Signed)
Clinical Social Work Department BRIEF PSYCHOSOCIAL ASSESSMENT 03/17/2012  Patient:  Rick Edwards, Rick Edwards     Account Number:  [id number]     Admit date:  03/11/2012  Clinical Social Worker:  Andres Shad  Date/Time:  03/17/2012 02:00 PM  Referred by:  Physician  Date Referred:  03/17/2012 Referred for  SNF Placement   Other Referral:   Per CSW weekday handoff and follow up over weekend   Interview type:  Patient Other interview type:   Family helped with assessment, since patient is hard of hearing.    PSYCHOSOCIAL DATA Living Status:  ALONE Admitted from facility:   Level of care:   Primary support name:  Harland Dingwall Primary support relationship to patient:  FAMILY Degree of support available:   Good family support. Three family members in room during time of assessment (brothers and sisters)  Will stay with mother and father at DC    CURRENT CONCERNS Current Concerns  Post-Acute Placement   Other Concerns:   none noted    SOCIAL WORK ASSESSMENT / PLAN Met with patient and family at the bedside. Patient is very HOH, thus sister was able to help with communication for patient, since he can read lips and if you shout for him to hear.  Discussed plan at dc with family. Family reports they will want home health and patient will stay with his parents at dc until he able to take care of himself.  Discussed alternative option of SNF, with family and explained to patient, but he was not agreeable and did not want to entertain the idea.  Patient to dc home with family support and 24 hour supervision per family.  CM already working on case and if [facility] is recommended and ordered will follow up.  No other needs at this time.  CSW will sign off and if needs arise please reconsult.   Assessment/plan status:  No Further Intervention Required Other assessment/ plan:   none required   Information/referral to community resources:   none reported, no needs at this time.     PATIENT'S/FAMILY'S RESPONSE TO PLAN OF CARE: All agreeable to home with family with HH.    Rick Edwards, MSW LCSW [phone]

## 2012-03-18 LAB — PREALBUMIN: Prealbumin: 5.1 mg/dL — ABNORMAL LOW (ref 17.0–34.0)

## 2012-03-18 LAB — CBC
MCH: 29.6 pg (ref 26.0–34.0)
MCHC: 34.9 g/dL (ref 30.0–36.0)
Platelets: 374 10*3/uL (ref 150–400)
RDW: 16.4 % — ABNORMAL HIGH (ref 11.5–15.5)

## 2012-03-18 LAB — DIFFERENTIAL
Basophils Absolute: 0 10*3/uL (ref 0.0–0.1)
Basophils Relative: 0 % (ref 0–1)
Eosinophils Absolute: 0.1 10*3/uL (ref 0.0–0.7)
Neutrophils Relative %: 86 % — ABNORMAL HIGH (ref 43–77)

## 2012-03-18 LAB — COMPREHENSIVE METABOLIC PANEL
Alkaline Phosphatase: 139 U/L — ABNORMAL HIGH (ref 39–117)
BUN: 4 mg/dL — ABNORMAL LOW (ref 6–23)
Chloride: 95 mEq/L — ABNORMAL LOW (ref 96–112)
Creatinine, Ser: 0.46 mg/dL — ABNORMAL LOW (ref 0.50–1.35)
GFR calc Af Amer: >90 mL/min (ref >90)
Glucose, Bld: 113 mg/dL — ABNORMAL HIGH (ref 70–99)
Potassium: 3.5 mEq/L (ref 3.5–5.1)
Total Bilirubin: 1.8 mg/dL — ABNORMAL HIGH (ref 0.3–1.2)

## 2012-03-18 LAB — TYPE AND SCREEN
Unit division: 0
Unit division: 0

## 2012-03-18 LAB — TRIGLYCERIDES: Triglycerides: 186 mg/dL — ABNORMAL HIGH (ref <150)

## 2012-03-18 LAB — GLUCOSE, CAPILLARY

## 2012-03-18 LAB — CHOLESTEROL, TOTAL: Cholesterol: 129 mg/dL (ref 0–200)

## 2012-03-18 LAB — MAGNESIUM: Magnesium: 1.7 mg/dL (ref 1.5–2.5)

## 2012-03-18 LAB — PROTIME-INR: Prothrombin Time: 17.3 seconds — ABNORMAL HIGH (ref 11.6–15.2)

## 2012-03-18 MED ORDER — MAGNESIUM SULFATE 40 MG/ML IJ SOLN
2.0000 g | Freq: Once | INTRAMUSCULAR | Status: AC
  Administered 2012-03-18: 2 g via INTRAVENOUS
  Filled 2012-03-18: qty 50

## 2012-03-18 MED ORDER — ZINC TRACE METAL 1 MG/ML IV SOLN
INTRAVENOUS | Status: AC
  Administered 2012-03-18: 18:00:00 via INTRAVENOUS
  Filled 2012-03-18: qty 2000

## 2012-03-18 MED ORDER — WARFARIN SODIUM 5 MG IV SOLR
5.0000 mg | Freq: Once | INTRAVENOUS | Status: AC
  Administered 2012-03-18: 5 mg via INTRAVENOUS
  Filled 2012-03-18: qty 2.5

## 2012-03-18 MED ORDER — FAT EMULSION 20 % IV EMUL
250.0000 mL | INTRAVENOUS | Status: AC
  Administered 2012-03-18: 250 mL via INTRAVENOUS
  Filled 2012-03-18: qty 250

## 2012-03-18 MED ORDER — ZINC TRACE METAL 1 MG/ML IV SOLN
INTRAVENOUS | Status: DC
  Filled 2012-03-18: qty 1000

## 2012-03-18 MED ORDER — BOOST / RESOURCE BREEZE PO LIQD
1.0000 | Freq: Three times a day (TID) | ORAL | Status: DC
  Administered 2012-03-18 – 2012-03-28 (×28): 1 via ORAL

## 2012-03-18 MED ORDER — SODIUM CHLORIDE 0.9 % IV SOLN
INTRAVENOUS | Status: DC
  Administered 2012-03-18 – 2012-03-20 (×2): via INTRAVENOUS
  Administered 2012-03-21: 1000 mL via INTRAVENOUS
  Administered 2012-03-23: 02:00:00 via INTRAVENOUS

## 2012-03-18 MED ORDER — POTASSIUM CHLORIDE 10 MEQ/50ML IV SOLN
10.0000 meq | INTRAVENOUS | Status: AC
  Administered 2012-03-18 (×3): 10 meq via INTRAVENOUS
  Filled 2012-03-18: qty 150

## 2012-03-18 MED ORDER — FAT EMULSION 20 % IV EMUL
250.0000 mL | INTRAVENOUS | Status: DC
  Filled 2012-03-18: qty 250

## 2012-03-18 NOTE — Progress Notes (Signed)
ANTICOAGULATION CONSULT NOTE - Follow Up Consult  Pharmacy Consult for IV coumadin/lovenox Indication: +DVT, s/p embolectomy   Allergies  Allergen Reactions  . Mesalamine     REACTION: Rash   Labs:  Basename 03/18/12 0417 03/17/12 1152 03/17/12 1055 03/17/12 0359 03/16/12 0449 03/15/12 1245  HGB 10.4* 8.4* -- -- -- --  HCT 29.8* 24.6* -- 19.7* -- --  PLT 374 364 -- 308 -- --  APTT -- -- -- -- -- --  LABPROT 17.3* -- -- 14.3 15.8* --  INR 1.39 -- -- 1.09 1.23 --  HEPARINUNFRC -- -- -- -- -- 0.10*  CREATININE 0.46* 0.55 0.47* -- -- --  CKTOTAL -- -- -- -- -- --  CKMB -- -- -- -- -- --  TROPONINI -- -- -- -- -- --    Estimated Creatinine Clearance: 99.4 ml/min (by C-G formula based on Cr of 0.46).   Assessment:  56 y.o male was on heparin for s/p R tibial embolectomy. HL remained subtherapeutic despite increasing dose. 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 Heparin was switched to lovenox since less dependant on AT for anticoagulation. Pt is NPO and recv'd ok per MD to initiate IV warfarin.   Overlap day 3/5  Goal of Therapy:  INR 2-3 Monitor platelets by anticoagulation protocol: Yes   Plan:  1. Continue 5mg  IV Warfarin 2. Continue Lovenox 65 mg q12h 3. F/u INR in AM 4. Monitor s/sx of bleeding  Okey Regal, PharmD [phone]  03/18/2012 9:06 AM

## 2012-03-18 NOTE — Progress Notes (Signed)
PARENTERAL NUTRITION CONSULT NOTE - Follow Up  Pharmacy Consult for TNA Indication: Severe pre-op malnutrition and expected post op ileus  Patient Measurements: Height: 5\' 9"  (175.3 cm) Weight: 145 lb 1 oz (65.8 kg) IBW/kg (Calculated) : 70.7  Usual Weight: ~70 kg  Vital Signs: Temp: 98 F (36.7 C) (08/12 0700) Temp src: Oral (08/12 0700) BP: 142/97 mmHg (08/12 0700) Pulse Rate: 102  (08/12 0700) Intake/Output from previous day: 08/11 0701 - 08/12 0700 In: 2400 [I.V.:805; Blood:675; TPN:920] Out: 4400 [Urine:4000; Stool:400] Intake/Output from this shift:    Labs:  [facility] 03/18/12 0417 03/17/12 1152 03/17/12 0359 03/16/12 0449  WBC 15.1* 11.9* 10.4 --  HGB 10.4* 8.4* 6.6* --  HCT 29.8* 24.6* 19.7* --  PLT 374 364 308 --  APTT -- -- -- --  INR 1.39 -- 1.09 1.23     Basename 03/18/12 0417 03/17/12 1152 03/17/12 1055 03/16/12 0449  NA 129* 134* 133* --  K 3.5 3.7 3.9 --  CL 95* 100 100 --  CO2 28 31 28  --  GLUCOSE 113* 97 110* --  BUN 4* 4* 4* --  CREATININE 0.46* 0.55 0.47* --  LABCREA -- -- -- --  CREAT24HRUR -- -- -- --  CALCIUM 8.0* 7.8* 7.5* --  MG 1.7 -- -- 1.9  PHOS 3.5 -- -- 2.5  PROT 5.5* -- -- --  ALBUMIN 1.3* -- -- --  AST 18 -- -- --  ALT 20 -- -- --  ALKPHOS 139* -- -- --  BILITOT 1.8* -- -- --  BILIDIR -- -- -- --  IBILI -- -- -- --  PREALBUMIN -- -- -- --  TRIG 186* -- -- --  CHOLHDL -- -- -- --  CHOL 129 -- -- --   Estimated Creatinine Clearance: 99.4 ml/min (by C-G formula based on Cr of 0.46).    Basename 03/18/12 0527 03/18/12 0356 03/17/12 2325  GLUCAP 128* 111* 121*   CBGs & Insulin requirements past 24 hours:  1 unit SSI on sensitive SSI q6h  Nutritional Goals: RD recommendation: 1610-9604 kcal/day 75-94 g protein/day Goal is Clinimix E 5/15 at 73ml/hr and IVFE 20% at 10 ml/hr on MWF = 96gm protein (1.5 g/kg/d), 1843 kCal on MWF, 1363 Kcal on non-lipid days  Current Diet: NPO with NGT TPN infusing at 40  ml/hr  Assessment:  89 YOM with severe Crohn's colitis of the right colon with hx of perforation and right hemicolectomy with anastomosis 10 years ago. Recently developed increased postprandial pain with 20 pound weight loss over the past month due to lack of appetite. He is now s/p partial colectomy with end ileostomy and repair of enterotomy on 03/11/2012. PICC line ordered 8/5; TNA started 8/7 d/t delay in placing PICC d/t need for urgent thrombectomy for ischemic right foot 8/6.  GI: s/p subtotal colectomy w/ end ileostmy and repair of enterotomy 8/5, TNA started for anticipation of post-op ileus; 400 cc stool noted; full liquid diet ordered 8/12.  Endo: no h/o DM, CBG at goal of <150 Lytes: corrected Ca = 10.2. Na continues to be low- suspect fluid overload. Noted MIVF with D5. K and Mg slightly below goals of 4 and 2 respectively. Renal: SCr stable, UOP incr to 2.5 ml/kg/hr Hepatobil:  Alkphos elevated, Tbil high. AST/ALT WNL, prealbumin low at baseline. Noted Chol WNL, triglycerides incr slightly. Pulm: RA Neuro: GCS 15, Pain 4-9/10, on prn pain meds Cards: hypertensive, HR 100s ID: afeb, WBCincr, cont on empiric ertapenem 8/5>>. No micro data Best Practices:  scds, mouthcare, full dose lovenox>>Coumadin  Plan:  - Change MIVF to NS at 30cc/hr - Will give 3 runs of KCL and 2gm IV Mg today - Increase Clinimix E5/15 to 60 ml/hr tonight. Anticipate d/c soon given diet advanced and colostomy functional. - Provide available trace elements, MVI, Lipids at 10 ml/hr MWF only 2/2 national shortage - Will d/c sensitive SSI q6h/CBG checks as patient's glycemia is well controlled. - f/u BMET, Mg, Phos, Prealb and CBGs in AM - f/u toleration of full liquid diet  Kyarah Enamorado K. Allena Katz, PharmD, BCPS.  Clinical Pharmacist Pager [phone]. 03/18/2012 8:33 AM

## 2012-03-18 NOTE — Progress Notes (Signed)
Patient ID: Rick Edwards, male   DOB: 1956/04/12, 56 y.o.   MRN: [id number] 6 Days Post-Op  Subjective: Mild occ incisional pain.  Tol CL.  Ostomy functioning.  Paresthesias R foot have made ambulating difficult   Objective: Vital signs in last 24 hours: Temp:  [98 F (36.7 C)-99.7 F (37.6 C)] 98 F (36.7 C) (08/12 0700) Pulse Rate:  [81-105] 94  (08/12 0410) Resp:  [17-27] 17  (08/12 0410) BP: (140-159)/(85-99) 154/95 mmHg (08/12 0410) SpO2:  [95 %-98 %] 95 % (08/12 0410) Last BM Date: 03/11/12  Intake/Output from previous day: 08/11 0701 - 08/12 0700 In: 2325 [I.V.:770; Blood:675; TPN:880] Out: 4400 [Urine:4000; Stool:400] Intake/Output this shift:    General appearance: alert and no distress GI: Soft, minimal peri incisional tenderness, stoma healthy Incision/Wound: Mild exudate  Lab Results:   Basename 03/18/12 0417 03/17/12 1152  WBC 15.1* 11.9*  HGB 10.4* 8.4*  HCT 29.8* 24.6*  PLT 374 364   BMET  Basename 03/18/12 0417 03/17/12 1152  NA 129* 134*  K 3.5 3.7  CL 95* 100  CO2 28 31  GLUCOSE 113* 97  BUN 4* 4*  CREATININE 0.46* 0.55  CALCIUM 8.0* 7.8*     Studies/Results: No results found.  Anti-infectives: Anti-infectives     Start     Dose/Rate Route Frequency Ordered Stop   03/11/12 1930   ertapenem (INVANZ) 1 g in sodium chloride 0.9 % 50 mL IVPB        1 g 100 mL/hr over 30 Minutes Intravenous Every 24 hours 03/11/12 1835     03/11/12 0809   ertapenem (INVANZ) 1 g in sodium chloride 0.9 % 50 mL IVPB        1 g 100 mL/hr over 30 Minutes Intravenous 60 min pre-op 03/11/12 0809 03/11/12 1100          Assessment/Plan: s/p Procedure(s): EMBOLECTOMY INTRA OPERATIVE ARTERIOGRAM Colectomy and ileostomy Stable/improving  WBC increased-will observe and cont abx today-no apparent source PT consult Advance to [facility] diet   LOS: 7 days    Marton Malizia T 03/18/2012

## 2012-03-18 NOTE — Progress Notes (Signed)
VASCULAR PROGRESS NOTE  SUBJECTIVE: Some right foot pain which is not new.  PHYSICAL EXAM: Filed Vitals:   03/17/12 1918 03/17/12 2100 03/17/12 2320 03/18/12 0410  BP:  144/92 141/89 154/95  Pulse:  92 88 94  Temp: 99.7 F (37.6 C)  98.9 F (37.2 C) 99.2 F (37.3 C)  TempSrc: Oral  Oral Oral  Resp:  19 20 17   Height:      Weight:      SpO2:  98% 97% 95%   Right below the knee incision looks fine. Brisk anterior tibial and posterior tibial signal with the Doppler. Monophasic damp and dorsalis pedis signal.  LABS: Lab Results  Component Value Date   WBC 15.1* 03/18/2012   HGB 10.4* 03/18/2012   HCT 29.8* 03/18/2012   MCV 84.9 03/18/2012   PLT 374 03/18/2012   Lab Results  Component Value Date   CREATININE 0.46* 03/18/2012   Lab Results  Component Value Date   INR 1.39 03/18/2012   CBG (last 3)   Basename 03/18/12 0527 03/17/12 2325 03/17/12 1834  GLUCAP 128* 121* 116*     ASSESSMENT/PLAN: 1. 6 Days Post-Op s/p: Tibial embolectomy with vein patch angioplasties. 2. Patient is on Lovenox and Coumadin per pharmacy. 3. Anti-thrombin 3 deficiency. Will need lifetime Coumadin likely. 4. Continue physical therapy.  Waverly Ferrari, MD, FACS Beeper: [phone] 03/18/2012

## 2012-03-18 NOTE — Progress Notes (Addendum)
Nutrition Follow-up  Intervention:  Resource Breeze PO TID between meals to maximize oral intake.  Assessment:   Patient reports tolerance of Clear Liquids; diet advanced to Full Liquids today.  Patient says he has been taking it easy, and eating small amounts at meal times; c/o early satiety.  Patient is receiving TPN with Clinimix E 5/15 @ 40 ml/hr, increasing to 60 ml/h today.  Lipids (20% IVFE @ 10 ml/hr), multivitamins, and trace elements are provided 3 times weekly (MWF) due to national backorder.  Will provide 887 kcal and 48 grams protein daily (based on weekly average).  Meets 49% minimum estimated kcal and 60% minimum estimated protein needs.  Diet Order:  Full Liquids  Meds: Scheduled Meds:   . antiseptic oral rinse  15 mL Mouth Rinse q12n4p  . chlorhexidine  15 mL Mouth Rinse BID  . coumadin book   Does not apply Once  . enoxaparin (LOVENOX) injection  65 mg Subcutaneous Q12H  . ertapenem (INVANZ) IV  1 g Intravenous Q24H  . magnesium sulfate 1 - 4 g bolus IVPB  2 g Intravenous Once  . potassium chloride  10 mEq Intravenous Q1 Hr x 3  . sodium chloride  10-40 mL Intracatheter Q12H  . warfarin  5 mg Intravenous ONCE-1800  . warfarin  5 mg Intravenous ONCE-1800  . warfarin   Does not apply Once  . Warfarin - Pharmacist Dosing Inpatient   Does not apply q1800  . DISCONTD: insulin aspart  0-9 Units Subcutaneous Q6H   Continuous Infusions:   . sodium chloride 30 mL/hr at 03/18/12 1139  . TPN (CLINIMIX) +/- additives     And  . fat emulsion    . TPN (CLINIMIX) +/- additives 40 mL/hr at 03/16/12 1740  . TPN (CLINIMIX) +/- additives 40 mL/hr at 03/17/12 1721  . DISCONTD: dextrose 5 % and 0.9 % NaCl with KCl 20 mEq/L 35 mL/hr at 03/17/12 1357  . DISCONTD: fat emulsion    . DISCONTD: TPN (CLINIMIX) +/- additives     PRN Meds:.HYDROmorphone (DILAUDID) injection, ondansetron (ZOFRAN) IV, ondansetron, sodium chloride  Labs:  CMP     Component Value Date/Time   NA 129*  03/18/2012 0417   K 3.5 03/18/2012 0417   CL 95* 03/18/2012 0417   CO2 28 03/18/2012 0417   GLUCOSE 113* 03/18/2012 0417   GLUCOSE 101 02/26/2008 1535   BUN 4* 03/18/2012 0417   CREATININE 0.46* 03/18/2012 0417   CALCIUM 8.0* 03/18/2012 0417   PROT 5.5* 03/18/2012 0417   ALBUMIN 1.3* 03/18/2012 0417   AST 18 03/18/2012 0417   ALT 20 03/18/2012 0417   ALKPHOS 139* 03/18/2012 0417   BILITOT 1.8* 03/18/2012 0417   GFRNONAA >90 03/18/2012 0417   GFRAA >90 03/18/2012 0417   CBG (last 3)   Basename 03/18/12 0527 03/18/12 0356 03/17/12 2325  GLUCAP 128* 111* 121*    Sodium  Date/Time Value Range Status  03/18/2012  4:17 AM 129* 135 - 145 mEq/L Final  03/17/2012 11:52 AM 134* 135 - 145 mEq/L Final  03/17/2012 10:55 AM 133* 135 - 145 mEq/L Final    Potassium  Date/Time Value Range Status  03/18/2012  4:17 AM 3.5  3.5 - 5.1 mEq/L Final  03/17/2012 11:52 AM 3.7  3.5 - 5.1 mEq/L Final  03/17/2012 10:55 AM 3.9  3.5 - 5.1 mEq/L Final    Phosphorus  Date/Time Value Range Status  03/18/2012  4:17 AM 3.5  2.3 - 4.6 mg/dL Final  2/95/2841  3:24  AM 2.5  2.3 - 4.6 mg/dL Final  01/09/7845  9:62 AM 2.8  2.3 - 4.6 mg/dL Final    Magnesium  Date/Time Value Range Status  03/18/2012  4:17 AM 1.7  1.5 - 2.5 mg/dL Final  9/52/8413  2:44 AM 1.9  1.5 - 2.5 mg/dL Final  0/08/270  5:36 AM 1.8  1.5 - 2.5 mg/dL Final     Intake/Output Summary (Last 24 hours) at 03/18/12 1545 Last data filed at 03/18/12 1500  Gross per 24 hour  Intake 1977.5 ml  Output   3800 ml  Net -1822.5 ml    Weight Status:  65.8 kg up from 62.7 kg on 8/6.  Re-estimated needs:  1800-2000 kcals, 80-100 grams protein daily  Nutrition Dx:  Inadequate oral intake, now related to altered GI function as evidenced by minimal intake of liquid diet, ongoing.  Goal:  Intake to meet 90-100% of estimated nutrition needs to prevent further weight loss.  Monitor:  PO intake, labs, weight trend.   Joaquin Courts, RD, CNSC, LDN Pager#  [phone] After Hours Pager# [phone]

## 2012-03-18 NOTE — Progress Notes (Signed)
Physical Therapy Treatment Patient Details Name: Rick Edwards MRN: [id number] DOB: Dec 16, 1955 Today's Date: 03/18/2012 Time: 4098-1191 PT Time Calculation (min): 23 min  PT Assessment / Plan / Recommendation Comments on Treatment Session  Pt admitted s/p ileostomy and right LE embolectomy.  Pt continues to be limited by pain, but was able to tolerate increased ambulation distance today.    Follow Up Recommendations  Home health PT;Supervision/Assistance - 24 hour    Barriers to Discharge        Equipment Recommendations  Other (comment) (TBA)    Recommendations for Other Services OT consult  Frequency Min 4X/week   Plan Discharge plan remains appropriate;Frequency remains appropriate    Precautions / Restrictions Precautions Precautions: Fall Restrictions Weight Bearing Restrictions: No   Pertinent Vitals/Pain 8/10 in right foot.  Pt repositioned and RN made aware.    Mobility  Bed Mobility Bed Mobility: Not assessed Transfers Transfers: Sit to Stand;Stand to Sit Sit to Stand: 4: Min assist;With upper extremity assist;From chair/3-in-1 Stand to Sit: 4: Min assist;With upper extremity assist;To chair/3-in-1 Details for Transfer Assistance: Assist for balance and to translate trunk anterior due to pain.  Cues for safest sequence and hand/right LE placement. Ambulation/Gait Ambulation/Gait Assistance: 4: Min assist Ambulation Distance (Feet): 20 Feet Assistive device: Rolling walker Ambulation/Gait Assistance Details: Assist to off weight left LE during stance due to pain as well as extend posture at hips/chest.  Cues for safest sequence. Gait Pattern: Decreased stride length;Step-to pattern;Right foot flat;Antalgic Stairs: No Wheelchair Mobility Wheelchair Mobility: No    Exercises     PT Diagnosis:    PT Problem List:   PT Treatment Interventions:     PT Goals Acute Rehab PT Goals PT Goal Formulation: With patient Time For Goal Achievement:  03/23/12 Potential to Achieve Goals: Good PT Goal: Sit to Stand - Progress: Progressing toward goal PT Goal: Ambulate - Progress: Progressing toward goal  Visit Information  Last PT Received On: 03/18/12 Assistance Needed: +1    Subjective Data  Subjective: "I don't know if I can do it." Patient Stated Goal: return home with girlfriend   Cognition  Overall Cognitive Status: Appears within functional limits for tasks assessed/performed Arousal/Alertness: Awake/alert Orientation Level: Appears intact for tasks assessed Behavior During Session: [facility] for tasks performed    Balance  Balance Balance Assessed: No  End of Session PT - End of Session Equipment Utilized During Treatment: Gait belt Activity Tolerance: Patient limited by pain Patient left: in chair;with call bell/phone within reach;with family/visitor present Nurse Communication: Mobility status;Patient requests pain meds   GP     [name] 03/18/2012, 10:40 AM  03/18/2012 [name], PT, DPT [phone]

## 2012-03-19 LAB — BASIC METABOLIC PANEL
CO2: 28 mEq/L (ref 19–32)
Chloride: 99 mEq/L (ref 96–112)
Sodium: 133 mEq/L — ABNORMAL LOW (ref 135–145)

## 2012-03-19 LAB — PROTIME-INR: INR: 1.57 — ABNORMAL HIGH (ref 0.00–1.49)

## 2012-03-19 LAB — CBC
HCT: 27.5 % — ABNORMAL LOW (ref 39.0–52.0)
Hemoglobin: 9.5 g/dL — ABNORMAL LOW (ref 13.0–17.0)
RBC: 3.21 MIL/uL — ABNORMAL LOW (ref 4.22–5.81)
WBC: 11.1 10*3/uL — ABNORMAL HIGH (ref 4.0–10.5)

## 2012-03-19 LAB — PHOSPHORUS: Phosphorus: 4.5 mg/dL (ref 2.3–4.6)

## 2012-03-19 LAB — MAGNESIUM: Magnesium: 2 mg/dL (ref 1.5–2.5)

## 2012-03-19 MED ORDER — POTASSIUM CHLORIDE 10 MEQ/50ML IV SOLN
10.0000 meq | INTRAVENOUS | Status: AC
  Administered 2012-03-19 (×3): 10 meq via INTRAVENOUS
  Filled 2012-03-19 (×3): qty 50

## 2012-03-19 MED ORDER — CLINIMIX E/DEXTROSE (5/15) 5 % IV SOLN
INTRAVENOUS | Status: AC
  Administered 2012-03-19: 18:00:00 via INTRAVENOUS
  Filled 2012-03-19: qty 2000

## 2012-03-19 MED ORDER — WARFARIN SODIUM 5 MG IV SOLR
5.0000 mg | Freq: Once | INTRAVENOUS | Status: AC
  Administered 2012-03-19: 5 mg via INTRAVENOUS
  Filled 2012-03-19: qty 2.5

## 2012-03-19 NOTE — Progress Notes (Signed)
ANTICOAGULATION CONSULT NOTE - Follow Up Consult  Pharmacy Consult for IV coumadin/lovenox Indication: +DVT, s/p embolectomy   Allergies  Allergen Reactions  . Mesalamine     REACTION: Rash   Labs:  Basename 03/19/12 0400 03/18/12 0417 03/17/12 1152 03/17/12 0359  HGB 9.5* 10.4* -- --  HCT 27.5* 29.8* 24.6* --  PLT 493* 374 364 --  APTT -- -- -- --  LABPROT 19.1* 17.3* -- 14.3  INR 1.57* 1.39 -- 1.09  HEPARINUNFRC -- -- -- --  CREATININE 0.42* 0.46* 0.55 --  CKTOTAL -- -- -- --  CKMB -- -- -- --  TROPONINI -- -- -- --    Estimated Creatinine Clearance: 99.4 ml/min (by C-G formula based on Cr of 0.42).   Assessment: 56 y.o male was on heparin for s/p R tibial embolectomy. HL remained subtherapeutic despite increasing dose. 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 Heparin was switched to lovenox since less dependant on AT for anticoagulation.   INR today-=1.57, still receiving IV Coumadin  Overlap day 4/5  Goal of Therapy:  INR 2-3 Monitor platelets by anticoagulation protocol: Yes   Plan:  1. Continue 5mg  IV Warfarin (follow up for po meds) 2. Continue Lovenox 65 mg q12h 3. F/u INR in AM 4. Monitor s/sx of bleeding  Okey Regal, PharmD [phone]  03/19/2012 8:53 AM

## 2012-03-19 NOTE — Progress Notes (Addendum)
[facility] [place]  Progress Note Bypass Surgery  Date of Surgery: 03/11/2012 - 03/12/2012  Procedure(s): R Tibial EMBOLECTOMY INTRA OPERATIVE ARTERIOGRAM Surgeon: Surgeon(s): Chuck Hint, MD  7 Days Post-Op  History of Present Illness  Rick Edwards is a 56 y.o. male who is S/P one week post-op embolectomy RLE. Pt still having pain in toes of right foot and 1st-3rd toes are demarcating  Significant Diagnostic Studies: CBC Lab Results  Component Value Date   WBC 11.1* 03/19/2012   HGB 9.5* 03/19/2012   HCT 27.5* 03/19/2012   MCV 85.7 03/19/2012   PLT 493* 03/19/2012    BMET     Component Value Date/Time   NA 133* 03/19/2012 0400   K 3.6 03/19/2012 0400   CL 99 03/19/2012 0400   CO2 28 03/19/2012 0400   GLUCOSE 107* 03/19/2012 0400   GLUCOSE 101 02/26/2008 1535   BUN 5* 03/19/2012 0400   CREATININE 0.42* 03/19/2012 0400   CALCIUM 7.7* 03/19/2012 0400   GFRNONAA >90 03/19/2012 0400   GFRAA >90 03/19/2012 0400    COAG Lab Results  Component Value Date   INR 1.57* 03/19/2012   INR 1.39 03/18/2012   INR 1.09 03/17/2012   No results found for this basename: PTT    Physical Examination  BP Readings from Last 3 Encounters:  03/19/12 139/94  03/19/12 139/94  03/19/12 139/94   Temp Readings from Last 3 Encounters:  03/19/12 98.8 F (37.1 C) Oral  03/19/12 98.8 F (37.1 C) Oral  03/19/12 98.8 F (37.1 C) Oral   SpO2 Readings from Last 3 Encounters:  03/19/12 100%  03/19/12 100%  03/19/12 100%   Pulse Readings from Last 3 Encounters:  03/19/12 90  03/19/12 90  03/19/12 90    Pt is A&O x 3 right lower extremity: Incision/s is/are clean,dry.intact, and  healing without hematoma, erythema or drainage Limb is warm to toes but toes are cold 1st through 3rd toes have  Ischemic changes and are demarcating  Right Dorsalis Pedis pulse is monophasic by Doppler RightPosterior tibial pulse is  monophasic by Doppler  Assessment/Plan: Pt.  Doing well except for pain in right toes which are demarcating - may eventually need to address this with poss amputation - will discuss with Dr. Edilia Bo Right foot drop unchanged Post-op pain is controlled Wounds are healing well PT/OT for ambulation Continue wound care as ordered  [place] [phone] 03/19/2012 7:51 AM  Agree with above. Toes on right foot dusky. Biphasic PT signal with doppler Brisk ATA and peroneal signal Monophasic plantar arch signal No DP signal.  Good flow to ankle but poor flow distally, likely secondary to distal thrombus. I do not think that thrombectomy of DP artery would be helpful and would likely make things worse. Continue Lovenox and Coumadin. Hopefully, toes will gradually improve. If they worsen, he could potentially require TMA.  Will follow. Ok to continue PTx from my standpoint. Also, OK to transfer to regular floor from my standpoint.   Di Kindle. Edilia Bo, MD, FACS Beeper [phone] 03/19/2012

## 2012-03-19 NOTE — Progress Notes (Signed)
PARENTERAL NUTRITION CONSULT NOTE - Follow Up  Pharmacy Consult for TNA Indication: Severe pre-op malnutrition and expected post op ileus  Patient Measurements: Height: 5\' 9"  (175.3 cm) Weight: 145 lb 1 oz (65.8 kg) IBW/kg (Calculated) : 70.7  Usual Weight: ~70 kg  Vital Signs: Temp: 98.3 F (36.8 C) (08/13 0807) Temp src: Oral (08/13 0358) BP: 139/94 mmHg (08/13 0358) Pulse Rate: 90  (08/13 0358) Intake/Output from previous day: 08/12 0701 - 08/13 0700 In: 1880 [I.V.:570; TPN:1310] Out: 1725 [Urine:1225; Stool:500] Intake/Output from this shift: Total I/O In: -  Out: 650 [Urine:650]  Labs:  [name] 03/19/12 0400 03/18/12 0417 03/17/12 1152 03/17/12 0359  WBC 11.1* 15.1* 11.9* --  HGB 9.5* 10.4* 8.4* --  HCT 27.5* 29.8* 24.6* --  PLT 493* 374 364 --  APTT -- -- -- --  INR 1.57* 1.39 -- 1.09     Basename 03/19/12 0400 03/18/12 0417 03/17/12 1152  NA 133* 129* 134*  K 3.6 3.5 3.7  CL 99 95* 100  CO2 28 28 31   GLUCOSE 107* 113* 97  BUN 5* 4* 4*  CREATININE 0.42* 0.46* 0.55  LABCREA -- -- --  CREAT24HRUR -- -- --  CALCIUM 7.7* 8.0* 7.8*  MG 2.0 1.7 --  PHOS 4.5 3.5 --  PROT -- 5.5* --  ALBUMIN -- 1.3* --  AST -- 18 --  ALT -- 20 --  ALKPHOS -- 139* --  BILITOT -- 1.8* --  BILIDIR -- -- --  IBILI -- -- --  PREALBUMIN -- 5.1* --  TRIG -- 186* --  CHOLHDL -- -- --  CHOL -- 129 --   Estimated Creatinine Clearance: 99.4 ml/min (by C-G formula based on Cr of 0.42).    Basename 03/18/12 0527 03/18/12 0356 03/17/12 2325  GLUCAP 128* 111* 121*   CBGs & Insulin requirements past 24 hours:  SSI d/c d/t good glucose control  Nutritional Goals: RD recommendation: 1800-2000 kcal/day 80-100 g protein/day Goal is Clinimix E 5/15 at 61ml/hr and IVFE 20% at 10 ml/hr on MWF = 96gm protein (1.5 g/kg/d), avg of 1600 KCal  Current Diet: NPO with NGT TPN infusing at 40 ml/hr  Assessment:  5 YOM with severe Crohn's colitis of the right colon with hx of  perforation and right hemicolectomy with anastomosis 10 years ago. Recently developed increased postprandial pain with 20 pound weight loss over the past month due to lack of appetite. He is now s/p partial colectomy with end ileostomy and repair of enterotomy on 03/11/2012. PICC line ordered 8/5; TNA started 8/7 d/t delay in placing PICC d/t need for urgent thrombectomy for ischemic right foot 8/6.  GI: s/p subtotal colectomy w/ end ileostmy and repair of enterotomy 8/5, TNA started for anticipation of post-op ileus; 500 cc stool noted; full liquid diet ordered 8/12- noted patient reports low intake/early satiety.  Endo: no h/o DM, CBG at goal of <150, SSI d/c 8/12 Lytes: corrected Ca = 10.2. Na continues to be low, but imoproving- suspect fluid overload. MIVF changed to NS 8/12. K slightly below goal of 4. Mg WNL s/p repletion. Renal: SCr stable, UOP good, 0.8 ml/kg/hr Hepatobil:  Alkphos elevated, Tbil high. AST/ALT WNL, prealbumin low at BL- still low, but slightly improved- expect slow progress d/t surgeries and ongoing inflammation. Noted Chol WNL, triglycerides incr slightly. Pulm: RA Neuro: GCS 15, Pain 0-8/10, on prn pain meds Cards: hypertensive, HR 100s- no meds ID: afeb, WBCincr, cont on empiric ertapenem 8/5>>. No micro data Best Practices: scds, mouthcare,  full dose lovenox>>Coumadin  Plan:  - Will give 3 runs of KCL today - Continue Clinimix E5/15 at 60 ml/hr tonight (75% goal). Anticipate d/c soon if diet can be advanced. Would plan to increase TPN if diet not advanced/tolerated within the next 1-2 days. - Provide available trace elements, MVI, Lipids at 10 ml/hr MWF only 2/2 national shortage - f/u BMET, Mg in AM - f/u diet advancement plans  Rick Edwards K. Allena Katz, PharmD, BCPS.  Clinical Pharmacist Pager [phone]. 03/19/2012 8:58 AM

## 2012-03-19 NOTE — Progress Notes (Signed)
Patient ID: Rick Edwards, male   DOB: May 19, 1956, 56 y.o.   MRN: [id number] 7 Days Post-Op  Subjective: Feels a little better daily. Concerned about difficulty walking with PT.  Denies abd C/O.  Tol [place] diet  Objective: Vital signs in last 24 hours: Temp:  [98.3 F (36.8 C)-99.6 F (37.6 C)] 99.6 F (37.6 C) (08/13 1155) Pulse Rate:  [89-99] 89  (08/13 0807) Resp:  [18-22] 22  (08/13 0807) BP: (134-156)/(91-96) 156/96 mmHg (08/13 0803) SpO2:  [98 %-100 %] 100 % (08/13 0807) Last BM Date: 03/18/12  Intake/Output from previous day: 08/12 0701 - 08/13 0700 In: 2780 [P.O.:800; I.V.:600; TPN:1380] Out: 1725 [Urine:1225; Stool:500] Intake/Output this shift: Total I/O In: 857 [P.O.:357; I.V.:120; IV Piggyback:100; TPN:280] Out: 1500 [Urine:1250; Stool:250]  General appearance: alert, cooperative and no distress GI: normal findings: soft, non-tender and minimally distended Incision/Wound: Dressed, did not change  Lab Results:   Basename 03/19/12 0400 03/18/12 0417  WBC 11.1* 15.1*  HGB 9.5* 10.4*  HCT 27.5* 29.8*  PLT 493* 374   BMET  Basename 03/19/12 0400 03/18/12 0417  NA 133* 129*  K 3.6 3.5  CL 99 95*  CO2 28 28  GLUCOSE 107* 113*  BUN 5* 4*  CREATININE 0.42* 0.46*  CALCIUM 7.7* 8.0*     Studies/Results: No results found.  Anti-infectives: Anti-infectives     Start     Dose/Rate Route Frequency Ordered Stop   03/11/12 1930   ertapenem (INVANZ) 1 g in sodium chloride 0.9 % 50 mL IVPB        1 g 100 mL/hr over 30 Minutes Intravenous Every 24 hours 03/11/12 1835     03/11/12 0809   ertapenem (INVANZ) 1 g in sodium chloride 0.9 % 50 mL IVPB        1 g 100 mL/hr over 30 Minutes Intravenous 60 min pre-op 03/11/12 0809 03/11/12 1100          Assessment/Plan: s/p Procedure(s): EMBOLECTOMY INTRA OPERATIVE ARTERIOGRAM S/P colectomy and ileostomy Doing well today.  Advance diet.  Cont PT   LOS: 8 days    Tamikka Pilger T 03/19/2012

## 2012-03-19 NOTE — Progress Notes (Signed)
Physical Therapy Treatment Patient Details Name: Rick Edwards MRN: [id number] DOB: [birth year] Today's Date: 03/19/2012 Time: 4098-1191 PT Time Calculation (min): 28 min  PT Assessment / Plan / Recommendation Comments on Treatment Session  Pt admitted s/p ileostomy and right LE embolectomy.  Pt continues to be limited by pain, but was able to tolerate ambulation again today.    Follow Up Recommendations  Home health PT;Supervision/Assistance - 24 hour (If not progressing, may require STSNF.)    Barriers to Discharge        Equipment Recommendations  Rolling walker with 5" wheels    Recommendations for Other Services    Frequency Min 4X/week   Plan Discharge plan remains appropriate;Frequency remains appropriate    Precautions / Restrictions Precautions Precautions: Fall Restrictions Weight Bearing Restrictions: No   Pertinent Vitals/Pain 10/10 in right LE.  Pt repositioned and RN notified for medication.    Mobility  Bed Mobility Bed Mobility: Supine to Sit;Sit to Supine Supine to Sit: 4: Min assist;With rails;HOB elevated (HOB 30 degrees.) Sit to Supine: 4: Min assist;With rail;HOB flat Details for Bed Mobility Assistance: Assist for right LE due to pain with cues for sequence. Transfers Transfers: Sit to Stand;Stand to Sit Sit to Stand: 4: Min guard;With upper extremity assist;From bed Stand to Sit: 4: Min guard;With upper extremity assist;To bed Details for Transfer Assistance: Guarding for balance with cues for safest hand/right LE placement. Ambulation/Gait Ambulation/Gait Assistance: 4: Min assist Ambulation Distance (Feet): 15 Feet Assistive device: Rolling walker Ambulation/Gait Assistance Details: Assist to off weight right LE during stance in order to advance left LE.  Cues for tall posture and safety.  Assist to extend at hips/trunk. Gait Pattern: Decreased stride length;Step-to pattern;Right foot flat;Antalgic;Trunk flexed Stairs: No Wheelchair  Mobility Wheelchair Mobility: No    Exercises     PT Diagnosis:    PT Problem List:   PT Treatment Interventions:     PT Goals Acute Rehab PT Goals PT Goal Formulation: With patient Time For Goal Achievement: 03/23/12 Potential to Achieve Goals: Good PT Goal: Sit to Supine/Side - Progress: Progressing toward goal PT Goal: Sit to Stand - Progress: Progressing toward goal PT Goal: Ambulate - Progress: Progressing toward goal  Visit Information  Last PT Received On: 03/19/12 Assistance Needed: +1    Subjective Data  Subjective: "I can try, but I don't know if I can." Patient Stated Goal: return home with girlfriend   Cognition  Overall Cognitive Status: Appears within functional limits for tasks assessed/performed Arousal/Alertness: Awake/alert Orientation Level: Appears intact for tasks assessed Behavior During Session: [facility] for tasks performed    Balance  Balance Balance Assessed: No  End of Session PT - End of Session Equipment Utilized During Treatment: Gait belt Activity Tolerance: Patient limited by pain Patient left: in bed;with call bell/phone within reach Nurse Communication: Mobility status;Patient requests pain meds   GP     [name] 03/19/2012, 11:41 AM  03/19/2012 [name], PT, DPT [phone]

## 2012-03-20 ENCOUNTER — Inpatient Hospital Stay (HOSPITAL_COMMUNITY): Payer: No Typology Code available for payment source

## 2012-03-20 ENCOUNTER — Telehealth (INDEPENDENT_AMBULATORY_CARE_PROVIDER_SITE_OTHER): Payer: Self-pay

## 2012-03-20 DIAGNOSIS — D7389 Other diseases of spleen: Secondary | ICD-10-CM

## 2012-03-20 DIAGNOSIS — D735 Infarction of spleen: Secondary | ICD-10-CM

## 2012-03-20 DIAGNOSIS — J90 Pleural effusion, not elsewhere classified: Secondary | ICD-10-CM

## 2012-03-20 LAB — BASIC METABOLIC PANEL
CO2: 27 mEq/L (ref 19–32)
Calcium: 7.6 mg/dL — ABNORMAL LOW (ref 8.4–10.5)
Chloride: 97 mEq/L (ref 96–112)
Potassium: 3.7 mEq/L (ref 3.5–5.1)
Sodium: 132 mEq/L — ABNORMAL LOW (ref 135–145)

## 2012-03-20 LAB — PROTIME-INR
INR: 1.17 (ref 0.00–1.49)
Prothrombin Time: 15.1 seconds (ref 11.6–15.2)

## 2012-03-20 LAB — PHOSPHORUS: Phosphorus: 4.3 mg/dL (ref 2.3–4.6)

## 2012-03-20 LAB — MAGNESIUM: Magnesium: 1.9 mg/dL (ref 1.5–2.5)

## 2012-03-20 LAB — CBC
Hemoglobin: 9.2 g/dL — ABNORMAL LOW (ref 13.0–17.0)
MCHC: 33.5 g/dL (ref 30.0–36.0)
Platelets: 628 10*3/uL — ABNORMAL HIGH (ref 150–400)
RBC: 3.15 MIL/uL — ABNORMAL LOW (ref 4.22–5.81)

## 2012-03-20 MED ORDER — VANCOMYCIN HCL 1000 MG IV SOLR
750.0000 mg | Freq: Three times a day (TID) | INTRAVENOUS | Status: DC
  Administered 2012-03-20 – 2012-03-21 (×2): 750 mg via INTRAVENOUS
  Filled 2012-03-20 (×3): qty 750

## 2012-03-20 MED ORDER — ZINC TRACE METAL 1 MG/ML IV SOLN
INTRAVENOUS | Status: AC
  Administered 2012-03-20: 18:00:00 via INTRAVENOUS
  Filled 2012-03-20: qty 2000

## 2012-03-20 MED ORDER — LEVOFLOXACIN IN D5W 750 MG/150ML IV SOLN
750.0000 mg | INTRAVENOUS | Status: DC
  Administered 2012-03-20: 750 mg via INTRAVENOUS
  Filled 2012-03-20 (×2): qty 150

## 2012-03-20 MED ORDER — IOHEXOL 300 MG/ML  SOLN
80.0000 mL | Freq: Once | INTRAMUSCULAR | Status: AC | PRN
  Administered 2012-03-20: 80 mL via INTRAVENOUS

## 2012-03-20 MED ORDER — FAT EMULSION 20 % IV EMUL
250.0000 mL | INTRAVENOUS | Status: AC
  Administered 2012-03-20: 250 mL via INTRAVENOUS
  Filled 2012-03-20: qty 250

## 2012-03-20 MED ORDER — ZINC TRACE METAL 1 MG/ML IV SOLN
INTRAVENOUS | Status: DC
  Filled 2012-03-20: qty 1000

## 2012-03-20 MED ORDER — VANCOMYCIN HCL IN DEXTROSE 1-5 GM/200ML-% IV SOLN
1000.0000 mg | Freq: Once | INTRAVENOUS | Status: DC
  Filled 2012-03-20: qty 200

## 2012-03-20 MED ORDER — CEFEPIME HCL 1 G IJ SOLR
1.0000 g | Freq: Three times a day (TID) | INTRAMUSCULAR | Status: DC
  Administered 2012-03-20 – 2012-03-21 (×3): 1 g via INTRAVENOUS
  Filled 2012-03-20 (×6): qty 1

## 2012-03-20 MED ORDER — FAT EMULSION 20 % IV EMUL
250.0000 mL | INTRAVENOUS | Status: DC
  Filled 2012-03-20: qty 250

## 2012-03-20 MED ORDER — WARFARIN SODIUM 7.5 MG PO TABS
7.5000 mg | ORAL_TABLET | Freq: Once | ORAL | Status: DC
  Filled 2012-03-20: qty 1

## 2012-03-20 MED ORDER — ADULT MULTIVITAMIN W/MINERALS CH
1.0000 | ORAL_TABLET | Freq: Every day | ORAL | Status: DC
  Administered 2012-03-20 – 2012-03-28 (×9): 1 via ORAL
  Filled 2012-03-20 (×9): qty 1

## 2012-03-20 NOTE — Progress Notes (Signed)
ANTIBIOTIC CONSULT NOTE - INITIAL  Pharmacy Consult for vancomycin, cefepime, levaquin Indication: HCAP  Allergies  Allergen Reactions  . Mesalamine     REACTION: Rash    Patient Measurements: Height: 5\' 9"  (175.3 cm) Weight: 145 lb 1 oz (65.8 kg) IBW/kg (Calculated) : 70.7    Vital Signs: Temp: 98.4 F (36.9 C) (08/14 1159) Temp src: Oral (08/14 0722) BP: 122/67 mmHg (08/14 1159) Pulse Rate: 80  (08/14 1159) Intake/Output from previous day: 08/13 0701 - 08/14 0700 In: 2844 [P.O.:714; I.V.:630; IV Piggyback:150; TPN:1350] Out: 3100 [Urine:2100; Stool:1000] Intake/Output from this shift: Total I/O In: 90 [I.V.:30; TPN:60] Out: 650 [Urine:575; Stool:75]  Labs:  [facility] 03/20/12 0500 03/19/12 0400 03/18/12 0417  WBC 14.0* 11.1* 15.1*  HGB 9.2* 9.5* 10.4*  PLT 628* 493* 374  LABCREA -- -- --  CREATININE 0.44* 0.42* 0.46*   Estimated Creatinine Clearance: 99.4 ml/min (by C-G formula based on Cr of 0.44). No results found for this basename: VANCOTROUGH:2,VANCOPEAK:2,VANCORANDOM:2,GENTTROUGH:2,GENTPEAK:2,GENTRANDOM:2,TOBRATROUGH:2,TOBRAPEAK:2,TOBRARND:2,AMIKACINPEAK:2,AMIKACINTROU:2,AMIKACIN:2, in the last 72 hours   Microbiology: Recent Results (from the past 720 hour(s))  SURGICAL PCR SCREEN     Status: Abnormal   Collection Time   03/01/12  9:57 AM      Component Value Range Status Comment   MRSA, PCR INVALID RESULTS, SPECIMEN SENT FOR CULTURE (*) NEGATIVE Final INFORMED K. ROBERTS RECEPTIONIST AT 1219 ON [id number] BY SHUEA   Staphylococcus aureus INVALID RESULTS, SPECIMEN SENT FOR CULTURE (*) NEGATIVE Final   MRSA CULTURE     Status: Normal   Collection Time   03/01/12 11:45 AM      Component Value Range Status Comment   Specimen Description NOSE   Final    Special Requests NONE   Final    Culture     Final    Value: NO STAPHYLOCOCCUS AUREUS ISOLATED     Note: NO MRSA ISOLATED   Report Status 03/04/2012 FINAL   Final     Medical History: Past Medical History    Diagnosis Date  . Crohn's disease   . Perianal abscess 2001    s/p right hemicolectomy, and drainage of retroperitoneal abcess 2003  . Hypertension   . Hearing loss   . GI bleed 6/11    w/ normal EGD 6/11, 3 PRBCs   . Anemia     anemia thrombocytopenia, saw Hematology 2011, can not r/o myeloproliferative d/c    Medications:  Prescriptions prior to admission  Medication Sig Dispense Refill  . amLODipine (NORVASC) 5 MG tablet Take 5 mg by mouth every morning.      Marland Kitchen azaTHIOprine (IMURAN) 50 MG tablet Take 50 mg by mouth every morning.       Marland Kitchen oxyCODONE-acetaminophen (PERCOCET/ROXICET) 5-325 MG per tablet Take 1 tablet by mouth every 4 (four) hours as needed for pain.  15 tablet  0  . potassium chloride SA (K-DUR,KLOR-CON) 20 MEQ tablet Take 1 tablet (20 mEq total) by mouth 2 (two) times daily.  10 tablet  0  . spironolactone (ALDACTONE) 25 MG tablet Take 25 mg by mouth every morning.       Assessment: 56 yo man s/p R hemicolectomy on ertapenem with new L sided pleuritic pain and  CXR with new LLL opacity concerning for PNA.  WBC up to 14 from 11.1. Weight = 65.8 kg. Creat cl ~ 99 ml/min  Goal of Therapy:  Vancomycin trough level 15-20 mcg/ml  Plan:  1. DC ertapenem 2. Vancomycin 750 mg IV q8h 3. Cefepime 1000 mg IV q8h 4. Levofloxacin  750 mg IV q24 hrs 5. F/u culture data and renal function. Herby Abraham, Pharm.D. [phone] 03/20/2012 3:58 PM

## 2012-03-20 NOTE — Consult Note (Signed)
WOC ostomy consult  Stoma type/location: Ileostomy Stoma red and viable, above skin level.   Stomal assessment/size: 13/4 inches Peristomal assessment: intact skin surrounding. Output 50cc watery green stool.   Ostomy pouching: 1pc  Education provided: Applied one piece pouch, patient watched and asked appropriate questions but did not participate.  Recommend home health assistance to provide further education after discharge; pt does not appear to fully understand pouching routines.  Discussed obtaining supplies and emptying, pt able to open and close with velcro.  Placed on [facility] discharge program.     Cammie Mcgee, RN, MSN, [employer]  [phone]

## 2012-03-20 NOTE — Progress Notes (Signed)
Agree with treatment and progress.  03/20/2012 Franklin Baumbach M Ralph Benavidez, PT, DPT [phone]   

## 2012-03-20 NOTE — Progress Notes (Signed)
ANTICOAGULATION CONSULT NOTE - Follow Up Consult  Pharmacy Consult for Coumadin / Lovenox Indication: +DVT, s/p embolectomy   Allergies  Allergen Reactions  . Mesalamine     REACTION: Rash   Labs:  Basename 03/20/12 0500 03/19/12 0400 03/18/12 0417  HGB 9.2* 9.5* --  HCT 27.5* 27.5* 29.8*  PLT 628* 493* 374  APTT -- -- --  LABPROT 15.1 19.1* 17.3*  INR 1.17 1.57* 1.39  HEPARINUNFRC -- -- --  CREATININE 0.44* 0.42* 0.46*  CKTOTAL -- -- --  CKMB -- -- --  TROPONINI -- -- --    Estimated Creatinine Clearance: 99.4 ml/min (by C-G formula based on Cr of 0.44).   Assessment: 56 y.o male was on heparin for s/p R tibial embolectomy. HL remained subtherapeutic despite increasing dose. 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 Heparin was switched to lovenox since less dependant on AT for anticoagulation.   INR today-=1.17, change to po  Overlap day 5  Goal of Therapy:  INR 2-3 Monitor platelets by anticoagulation protocol: Yes   Plan:  1. Coumadin 7.5 mg po x 1 dose 2. Continue Lovenox 65 mg q12h 3. F/u INR in AM 4. Monitor s/sx of bleeding  Okey Regal, PharmD [phone]  03/20/2012 9:32 AM

## 2012-03-20 NOTE — Progress Notes (Signed)
Utilization review completed.  

## 2012-03-20 NOTE — Progress Notes (Signed)
Patient ID: Rick Edwards, male   DOB: August 12, 1955, 56 y.o.   MRN: [id number] 8 Days Post-Op  Subjective: C/O some left low lateral pleuritic chest pain today.  No cough or SOB.  Eating some solid food.  No severe abd pain or nausea  Objective: Vital signs in last 24 hours: Temp:  [98.3 F (36.8 C)-100 F (37.8 C)] 99 F (37.2 C) (08/14 0722) Pulse Rate:  [89-101] 95  (08/14 0420) Resp:  [14-27] 14  (08/14 0420) BP: (139-156)/(85-96) 141/96 mmHg (08/14 0328) SpO2:  [98 %-100 %] 98 % (08/14 0420) Last BM Date: 03/19/12  Intake/Output from previous day: 08/13 0701 - 08/14 0700 In: 2844 [P.O.:714; I.V.:630; IV Piggyback:150; TPN:1350] Out: 3100 [Urine:2100; Stool:1000] Intake/Output this shift: Total I/O In: -  Out: 650 [Urine:575; Stool:75]  General appearance: alert, cooperative and no distress Resp: clear to auscultation bilaterally GI: normal findings: soft, non-tender Incision/Wound: Some exudate at base but mostly clean granulation  Lab Results:   Basename 03/20/12 0500 03/19/12 0400  WBC 14.0* 11.1*  HGB 9.2* 9.5*  HCT 27.5* 27.5*  PLT 628* 493*   BMET  Basename 03/20/12 0500 03/19/12 0400  NA 132* 133*  K 3.7 3.6  CL 97 99  CO2 27 28  GLUCOSE 111* 107*  BUN 7 5*  CREATININE 0.44* 0.42*  CALCIUM 7.6* 7.7*     Studies/Results: No results found.  Anti-infectives: Anti-infectives     Start     Dose/Rate Route Frequency Ordered Stop   03/11/12 1930   ertapenem (INVANZ) 1 g in sodium chloride 0.9 % 50 mL IVPB        1 g 100 mL/hr over 30 Minutes Intravenous Every 24 hours 03/11/12 1835     03/11/12 0809   ertapenem (INVANZ) 1 g in sodium chloride 0.9 % 50 mL IVPB        1 g 100 mL/hr over 30 Minutes Intravenous 60 min pre-op 03/11/12 0809 03/11/12 1100          Assessment/Plan: s/p Procedure(s): EMBOLECTOMY INTRA OPERATIVE ARTERIOGRAM Total abd colectomy with ileostomy Overall improving.  WBC back up a little.  New pleuritic CP.  Will  check CXR.  Cont abx for now.  CT if WBC stays up OK for transfer to floor   LOS: 9 days    Archana Eckman T 03/20/2012

## 2012-03-20 NOTE — Progress Notes (Signed)
Physical Therapy Treatment Patient Details Name: Rick Edwards MRN: [id number] DOB: 30-Apr-1956 Today's Date: 03/20/2012 Time: 4098-1191 PT Time Calculation (min): 22 min  PT Assessment / Plan / Recommendation Comments on Treatment Session  Pt continues to be limited by pain but increased ambulation distance this session.    Follow Up Recommendations  Home health PT;Supervision/Assistance - 24 hour (If not progressing, may require STSNF)    Barriers to Discharge        Equipment Recommendations  Rolling walker with 5" wheels    Recommendations for Other Services    Frequency Min 4X/week   Plan Discharge plan remains appropriate;Frequency remains appropriate    Precautions / Restrictions Precautions Precautions: Fall Restrictions Weight Bearing Restrictions: No   Pertinent Vitals/Pain Pt's mobility limited by pain in R LE.    Mobility  Bed Mobility Supine to Sit: 4: Min guard Sitting - Scoot to Edge of Bed: 6: Modified independent (Device/Increase time) Details for Bed Mobility Assistance: Pt required min guard for safety due to pt c/o increased pain in R LE. Transfers Transfers: Sit to Stand;Stand to Sit Sit to Stand: 4: Min assist Stand to Sit: 4: Min guard Details for Transfer Assistance: Pt required min assist for sit to stand due to increased pain limiting mobility and min guard for stand to sit. Pt also required verbal cueing safe hand placement. Ambulation/Gait Ambulation/Gait Assistance: 4: Min assist Ambulation Distance (Feet): 20 Feet Assistive device: Rolling walker Ambulation/Gait Assistance Details: Pt required min assist with ambulation to maintain balance due to decreased weight shift to right and pain limiting mobility. Pt required verbal cueing for proper walker sequencing and usage. Gait Pattern: Decreased stride length;Step-to pattern;Right foot flat;Antalgic;Trunk flexed Stairs: No Wheelchair Mobility Wheelchair Mobility: No      PT  Goals Acute Rehab PT Goals PT Goal Formulation: With patient Time For Goal Achievement: 03/23/12 Potential to Achieve Goals: Good PT Goal: Sit to Stand - Progress: Progressing toward goal PT Goal: Ambulate - Progress: Progressing toward goal  Visit Information  Last PT Received On: 03/20/12 Assistance Needed: +1    Subjective Data  Subjective: "I don't know if I can."   Cognition  Overall Cognitive Status: Appears within functional limits for tasks assessed/performed Arousal/Alertness: Awake/alert Orientation Level: Appears intact for tasks assessed Behavior During Session: [facility] for tasks performed    Balance  Balance Balance Assessed: No  End of Session PT - End of Session Equipment Utilized During Treatment: Gait belt Activity Tolerance: Patient limited by pain Patient left: in chair;with call bell/phone within reach Nurse Communication: Mobility status   GP     Rick Edwards 03/20/2012, 2:06 PM

## 2012-03-20 NOTE — Progress Notes (Addendum)
[facility] [place]  Progress Note Bypass Surgery  Date of Surgery: 03/11/2012 - 03/12/2012  Procedure(s): EMBOLECTOMY INTRA OPERATIVE ARTERIOGRAM Surgeon: Surgeon(s): Chuck Hint, MD  8 Days Post-Op  History of Present Illness  Rick Edwards is a 56 y.o. male who is S/P Procedure(s): EMBOLECTOMY INTRA OPERATIVE ARTERIOGRAM right.  The patient's pre-op symptoms of pain are Unchanged . Patients pain is well controlled as long as his foot is not touched or moved.       Imaging: No results found.  Significant Diagnostic Studies: CBC Lab Results  Component Value Date   WBC 14.0* 03/20/2012   HGB 9.2* 03/20/2012   HCT 27.5* 03/20/2012   MCV 87.3 03/20/2012   PLT 628* 03/20/2012    BMET     Component Value Date/Time   NA 132* 03/20/2012 0500   K 3.7 03/20/2012 0500   CL 97 03/20/2012 0500   CO2 27 03/20/2012 0500   GLUCOSE 111* 03/20/2012 0500   GLUCOSE 101 02/26/2008 1535   BUN 7 03/20/2012 0500   CREATININE 0.44* 03/20/2012 0500   CALCIUM 7.6* 03/20/2012 0500   GFRNONAA >90 03/20/2012 0500   GFRAA >90 03/20/2012 0500    COAG Lab Results  Component Value Date   INR 1.17 03/20/2012   INR 1.57* 03/19/2012   INR 1.39 03/18/2012   No results found for this basename: PTT    Physical Examination  BP Readings from Last 3 Encounters:  03/20/12 141/96  03/20/12 141/96  03/20/12 141/96   Temp Readings from Last 3 Encounters:  03/20/12 98.6 F (37 C) Oral  03/20/12 98.6 F (37 C) Oral  03/20/12 98.6 F (37 C) Oral   SpO2 Readings from Last 3 Encounters:  03/20/12 98%  03/20/12 98%  03/20/12 98%   Pulse Readings from Last 3 Encounters:  03/20/12 95  03/20/12 95  03/20/12 95    Pt is A&O x 3 right lower extremity: Incision/s is/are clean,dry.intact, and  healing without hematoma, erythema or drainage Limb is warm; with good color.  Toes are cool and painful to touch Doppler biphasic PT and DP right LE   Assessment/Plan: Pt.  Doing well Post-op pain is controlled Wounds are healing well PT/OT for ambulation He has inspiratory chest pain on the left and is unable to perform the IS due to the pain that start in the past 2 days.  His diet was progressed and could be "gas" pain.  Chest clear to auscultation per nursing.  Clinton Gallant [facility] [phone] 03/20/2012 7:14 AM  Agree with above. His Doppler signals in the right foot are actually improved today. I am able to obtain a monophasic dorsalis pedis signal in the proximal foot. He has essentially a biphasic posterior tibial signal peroneal signal and anterior tibial signal at the ankle. He is on heparin and Coumadin. We will continue to follow the toes.  Di Kindle. Edilia Bo, MD, FACS Beeper [phone] 03/20/2012

## 2012-03-20 NOTE — Progress Notes (Signed)
PARENTERAL NUTRITION CONSULT NOTE - Follow Up  Pharmacy Consult for TNA Indication: Severe pre-op malnutrition and expected post op ileus  Patient Measurements: Height: 5\' 9"  (175.3 cm) Weight: 145 lb 1 oz (65.8 kg) IBW/kg (Calculated) : 70.7  Usual Weight: ~70 kg  Vital Signs: Temp: 99 F (37.2 C) (08/14 0722) Temp src: Oral (08/14 0722) BP: 141/96 mmHg (08/14 0328) Pulse Rate: 95  (08/14 0420) Intake/Output from previous day: 08/13 0701 - 08/14 0700 In: 2844 [P.O.:714; I.V.:630; IV Piggyback:150; TPN:1350] Out: 3100 [Urine:2100; Stool:1000] Intake/Output from this shift: Total I/O In: -  Out: 650 [Urine:575; Stool:75]  Labs:  [facility] 03/20/12 0500 03/19/12 0400 03/18/12 0417  WBC 14.0* 11.1* 15.1*  HGB 9.2* 9.5* 10.4*  HCT 27.5* 27.5* 29.8*  PLT 628* 493* 374  APTT -- -- --  INR 1.17 1.57* 1.39     Basename 03/20/12 0500 03/19/12 0400 03/18/12 0417  NA 132* 133* 129*  K 3.7 3.6 3.5  CL 97 99 95*  CO2 27 28 28   GLUCOSE 111* 107* 113*  BUN 7 5* 4*  CREATININE 0.44* 0.42* 0.46*  LABCREA -- -- --  CREAT24HRUR -- -- --  CALCIUM 7.6* 7.7* 8.0*  MG 1.9 2.0 1.7  PHOS 4.3 4.5 3.5  PROT -- -- 5.5*  ALBUMIN -- -- 1.3*  AST -- -- 18  ALT -- -- 20  ALKPHOS -- -- 139*  BILITOT -- -- 1.8*  BILIDIR -- -- --  IBILI -- -- --  PREALBUMIN -- -- 5.1*  TRIG -- -- 186*  CHOLHDL -- -- --  CHOL -- -- 129   Estimated Creatinine Clearance: 99.4 ml/min (by C-G formula based on Cr of 0.44).    Basename 03/18/12 0527 03/18/12 0356 03/17/12 2325  GLUCAP 128* 111* 121*   CBGs & Insulin requirements past 24 hours:  SSI d/c d/t good glucose control  Nutritional Goals: RD recommendation: 1800-2000 kcal/day 80-100 g protein/day Goal is Clinimix E 5/15 at 50ml/hr and IVFE 20% at 10 ml/hr on MWF = 96gm protein (1.5 g/kg/d), avg of 1600 KCal  Current Diet: Clinimix E 5/15 at 60 cc/hr + 20% Lipids at 10 cc/hr on MWF provide an average of 1228 kcal and 72g protein per  day [employer] 3x/day between meals (charted as given) On 8/13: 40% of breakfast, 25% of lunch, 25% of dinner (per discussion with nurse, not charted)  Assessment:  77 YOM with severe Crohn's colitis of the right colon with hx of perforation and right hemicolectomy with anastomosis 10 years ago. Recently developed increased postprandial pain with 20 pound weight loss over the past month due to lack of appetite. He is now s/p partial colectomy with end ileostomy and repair of enterotomy on 03/11/2012. PICC line ordered 8/5; TNA started 8/7 d/t delay in placing PICC d/t need for urgent thrombectomy for ischemic right foot 8/6.  GI: s/p subtotal colectomy w/ end ileostmy and repair of enterotomy 8/5, TNA started for anticipation of post-op ileus; 500 cc stool noted; full liquid diet ordered 8/12, progressing to regular diet today. Only tolerated 25% of regular breakfast this morning. It is noted patient reports low intake/early satiety -- denies abdominal complaints Endo: no h/o DM, CBGs on BMET at goal of <150, SSI d/c 8/12 Lytes: corrected Ca~9.8, Na 132 << 133 -- likely continues to be low, d/t fluid overload. MIVF changed to NS 8/12. K 3.7 and slightly below goal of 4. Mg 1.9 Renal: SCr stable, UOP good, 1.3 ml/kg/hr Hepatobil:  Alkphos elevated,  Tbil high. AST/ALT WNL, prealbumin low at BL- still low, but slightly improved- expect slow progress d/t surgeries and ongoing inflammation. Noted Chol WNL, triglycerides incr slightly. Pulm: RA Neuro: GCS 15, Pain 2-10/10, c/o pleuritic chest pain on 8/14, on prn pain meds  Cards: BP elevated (140-150/90-100), HR elevated (90-100). On no CV meds  ID: Ertapenem for empiric abdominal coverage. Afebrile, WBC 14 << 11.1.No micro data Best Practices: scds, mouthcare, full dose lovenox>>Coumadin  Plan:  1. Will continue Clinimix E5/15 at 60 ml/hr tonight (75% goal) since patient drinking all resource supplements between meals however still tolerating <50%  of regular diet. Will plan to increase to goal rate if diet not advanced/tolerated.  2. Will change multivitamin to po d/t shortage and the patient tolerating some oral intake 3. Provide available trace elements, Lipids at 10 ml/hr MWF only 2/2 national shortage  4. Will continue to follow and monitor diet advancement 5. F/u TPN labs  Georgina Pillion, PharmD, BCPS Clinical Pharmacist Pager: [phone] 03/20/2012 8:12 AM

## 2012-03-20 NOTE — H&P (Signed)
PCP:   [name], MD   Chief Complaint:  Left sided chest pain  HPI: 56 y/o male with h/o crohn's disease, s/p right hemicolectomy,  admitted with abdominal pain, weight loss, he underwent subtotal colectomy with end ileostomy. Patient also was found to have ischemic foot, secondary to hypercoagulable  State due to AT - III deficiency. He underwent embolectomy and is now on warfarin. Patient experienced left sided pleuritic pain and CXR was obtained which showed New left lower lobe airspace opacity concerning for pneumonia. Suspect loculated posterior left pleural effusion. Patient denies shortness of breath, no cough , no phlegm.   Allergies:   Allergies  Allergen Reactions  . Mesalamine     REACTION: Rash      Past Medical History  Diagnosis Date  . Crohn's disease   . Perianal abscess 2001    s/p right hemicolectomy, and drainage of retroperitoneal abcess 2003  . Hypertension   . Hearing loss   . GI bleed 6/11    w/ normal EGD 6/11, 3 PRBCs   . Anemia     anemia thrombocytopenia, saw Hematology 2011, can not r/o myeloproliferative d/c    Past Surgical History  Procedure Date  . Hemicolectomy 08/29/2001    perforated abscess  . Anal fistulotomy 2002  . Partial colectomy 03/11/2012    Procedure: PARTIAL COLECTOMY;  Surgeon: Mariella Saa, MD;  Location: [facility] ORS;  Service: General;  Laterality: N/A;  subtotal colectomy transverse and left colon   . Embolectomy 03/12/2012    Procedure: EMBOLECTOMY;  Surgeon: Chuck Hint, MD;  Location: [facility] OR;  Service: Vascular;  Laterality: Right;  Right popliteal embolectomy with vein patch angioplasty, right posterior tibial embolectomy with vein patch angioplasty   . Intraoperative arteriogram 03/12/2012    Procedure: INTRA OPERATIVE ARTERIOGRAM;  Surgeon: Chuck Hint, MD;  Location: [facility] OR;  Service: Vascular;  Laterality: Right;    Prior to Admission medications   Medication Sig Start Date End Date Taking?  Authorizing Provider  amLODipine (NORVASC) 5 MG tablet Take 5 mg by mouth every morning. 09/21/11 09/20/12 Yes Wanda Plump, MD  azaTHIOprine (IMURAN) 50 MG tablet Take 50 mg by mouth every morning.    Yes Historical Provider, MD  potassium chloride SA (K-DUR,KLOR-CON) 20 MEQ tablet Take 1 tablet (20 mEq total) by mouth 2 (two) times daily. 03/07/12 03/07/13 Yes Lyanne Co, MD  spironolactone (ALDACTONE) 25 MG tablet Take 25 mg by mouth every morning.   Yes Historical Provider, MD    Social History:  reports that he quit smoking about 10 months ago. His smoking use included Cigarettes. He has a 2.25 pack-year smoking history. He has never used smokeless tobacco. He reports that he does not drink alcohol or use illicit drugs.  Family History  Problem Relation Age of Onset  . Coronary artery disease Neg Hx   . Diabetes Neg Hx   . Colon cancer Neg Hx   . Prostate cancer Neg Hx   . Stroke      GF, aunts   . Hypertension      "the whole family"  . Hypertension Mother   . Hyperlipidemia Mother   . Hypertension Father   . Hyperlipidemia Father   . Heart disease Father     Review of Systems:  HEENT: Denies headache, blurred vision, runny nose, sore throat,  Neck: Denies thyroid problems,lymphadenopathy Chest : Denies shortness of breath, no history of COPD Heart : Denies coronary arterey disease GU: Denies dysuria,  urgency, frequency of urination, hematuria Neuro: Denies stroke, seizures, syncope    Physical Exam: Blood pressure 122/67, pulse 80, temperature 98.4 F (36.9 C), temperature source Oral, resp. rate 18, height 5\' 9"  (1.753 m), weight 65.8 kg (145 lb 1 oz), SpO2 98.00%. Constitutional:   Patient is  in no acute distress and cooperative with exam. Head: Normocephalic and atraumatic Mouth: Mucus membranes moist Eyes: PERRL, EOMI, conjunctivae normal Neck: Supple, No Thyromegaly Cardiovascular: RRR, S1 normal, S2 normal Pulmonary/Chest: CTAB, no wheezes, rales, or  rhonchi Abdominal: Soft. Tender to palpation in epigastrium. Dressing in place Neurological : AO x3, Strenght is normal and symmetric bilaterally, cranial nerve II-XII are grossly intact, no focal motor deficit, sensory intact to light touch bilaterally.  Extremities : Right lower extremity has edema, stiches in place on the medial side of thigh   Labs on Admission:  Results for orders placed during the hospital encounter of 03/11/12 (from the past 48 hour(s))  CBC     Status: Abnormal   Collection Time   03/19/12  4:00 AM      Component Value Range Comment   WBC 11.1 (*) 4.0 - 10.5 K/uL    RBC 3.21 (*) 4.22 - 5.81 MIL/uL    Hemoglobin 9.5 (*) 13.0 - 17.0 g/dL    HCT 16.1 (*) 09.6 - 52.0 %    MCV 85.7  78.0 - 100.0 fL    MCH 29.6  26.0 - 34.0 pg    MCHC 34.5  30.0 - 36.0 g/dL    RDW 04.5 (*) 40.9 - 15.5 %    Platelets 493 (*) 150 - 400 K/uL   PROTIME-INR     Status: Abnormal   Collection Time   03/19/12  4:00 AM      Component Value Range Comment   Prothrombin Time 19.1 (*) 11.6 - 15.2 seconds    INR 1.57 (*) 0.00 - 1.49   BASIC METABOLIC PANEL     Status: Abnormal   Collection Time   03/19/12  4:00 AM      Component Value Range Comment   Sodium 133 (*) 135 - 145 mEq/L    Potassium 3.6  3.5 - 5.1 mEq/L    Chloride 99  96 - 112 mEq/L    CO2 28  19 - 32 mEq/L    Glucose, Bld 107 (*) 70 - 99 mg/dL    BUN 5 (*) 6 - 23 mg/dL    Creatinine, Ser 8.11 (*) 0.50 - 1.35 mg/dL    Calcium 7.7 (*) 8.4 - 10.5 mg/dL    GFR calc non Af Amer >90  >90 mL/min    GFR calc Af Amer >90  >90 mL/min   MAGNESIUM     Status: Normal   Collection Time   03/19/12  4:00 AM      Component Value Range Comment   Magnesium 2.0  1.5 - 2.5 mg/dL   PHOSPHORUS     Status: Normal   Collection Time   03/19/12  4:00 AM      Component Value Range Comment   Phosphorus 4.5  2.3 - 4.6 mg/dL   CBC     Status: Abnormal   Collection Time   03/20/12  5:00 AM      Component Value Range Comment   WBC 14.0 (*) 4.0 -  10.5 K/uL    RBC 3.15 (*) 4.22 - 5.81 MIL/uL    Hemoglobin 9.2 (*) 13.0 - 17.0 g/dL    HCT 91.4 (*) 78.2 -  52.0 %    MCV 87.3  78.0 - 100.0 fL    MCH 29.2  26.0 - 34.0 pg    MCHC 33.5  30.0 - 36.0 g/dL    RDW 11.9 (*) 14.7 - 15.5 %    Platelets 628 (*) 150 - 400 K/uL   PROTIME-INR     Status: Normal   Collection Time   03/20/12  5:00 AM      Component Value Range Comment   Prothrombin Time 15.1  11.6 - 15.2 seconds    INR 1.17  0.00 - 1.49   BASIC METABOLIC PANEL     Status: Abnormal   Collection Time   03/20/12  5:00 AM      Component Value Range Comment   Sodium 132 (*) 135 - 145 mEq/L    Potassium 3.7  3.5 - 5.1 mEq/L    Chloride 97  96 - 112 mEq/L    CO2 27  19 - 32 mEq/L    Glucose, Bld 111 (*) 70 - 99 mg/dL    BUN 7  6 - 23 mg/dL    Creatinine, Ser 8.29 (*) 0.50 - 1.35 mg/dL    Calcium 7.6 (*) 8.4 - 10.5 mg/dL    GFR calc non Af Amer >90  >90 mL/min    GFR calc Af Amer >90  >90 mL/min   MAGNESIUM     Status: Normal   Collection Time   03/20/12  5:00 AM      Component Value Range Comment   Magnesium 1.9  1.5 - 2.5 mg/dL   PHOSPHORUS     Status: Normal   Collection Time   03/20/12  5:00 AM      Component Value Range Comment   Phosphorus 4.3  2.3 - 4.6 mg/dL     Radiological Exams on Admission: Dg Chest 2 View   03/20/2012  *RADIOLOGY REPORT*  Clinical Data: Left pleuritic chest pain.  CHEST - 2 VIEW  Comparison: Chest x-ray 03/01/2012.  Abdominal films 03/07/2012  Findings: IMPRESSION: New left lower lobe airspace opacity concerning for pneumonia. Suspect loculated posterior left pleural effusion.  Pneumoperitoneum, question recent surgery versus bowel perforation.  Original Report Authenticated By: Cyndie Chime, M.D.    Assessment/Plan  Health care associated Pneumonia; Patient has CXR showing left lower lobe opacity with ? loculated pleural effusion. Will obtain CT chest with contrast to confirm the pleural effusion. We will start the patient on Vancomycin,  cefepime and Levaquin as per pharmacy consult.  Hypercoagulable state: Patient has AT -3 deficiency, and is currently on coumadin.  Peripheral arterial disease; S/p embolectomy Vascular surgery following  S/p subtotal colectomy Followed by surgery      Time Spent on Admission: 70 min  Analissa Bayless S [facility] Pager: [phone] 03/20/2012, 3:10 PM

## 2012-03-20 NOTE — Telephone Encounter (Signed)
Pharmacy calling to let Dr. Johna Sheriff know the pt's antibiotic had been changed.

## 2012-03-21 LAB — COMPREHENSIVE METABOLIC PANEL
AST: 23 U/L (ref 0–37)
Albumin: 1.4 g/dL — ABNORMAL LOW (ref 3.5–5.2)
Chloride: 97 mEq/L (ref 96–112)
Creatinine, Ser: 0.43 mg/dL — ABNORMAL LOW (ref 0.50–1.35)
Potassium: 3.5 mEq/L (ref 3.5–5.1)
Sodium: 133 mEq/L — ABNORMAL LOW (ref 135–145)
Total Bilirubin: 0.4 mg/dL (ref 0.3–1.2)

## 2012-03-21 LAB — CBC
HCT: 26.9 % — ABNORMAL LOW (ref 39.0–52.0)
Hemoglobin: 8.9 g/dL — ABNORMAL LOW (ref 13.0–17.0)
MCHC: 33.1 g/dL (ref 30.0–36.0)

## 2012-03-21 LAB — PROTIME-INR: INR: 1.53 — ABNORMAL HIGH (ref 0.00–1.49)

## 2012-03-21 LAB — PHOSPHORUS: Phosphorus: 4.2 mg/dL (ref 2.3–4.6)

## 2012-03-21 LAB — HEPARIN LEVEL (UNFRACTIONATED): Heparin Unfractionated: 0.69 IU/mL (ref 0.30–0.70)

## 2012-03-21 MED ORDER — MAGNESIUM SULFATE IN D5W 10-5 MG/ML-% IV SOLN
1.0000 g | Freq: Once | INTRAVENOUS | Status: AC
  Administered 2012-03-21: 1 g via INTRAVENOUS
  Filled 2012-03-21: qty 100

## 2012-03-21 MED ORDER — CLINIMIX E/DEXTROSE (5/15) 5 % IV SOLN
INTRAVENOUS | Status: AC
  Administered 2012-03-21: 17:00:00 via INTRAVENOUS
  Filled 2012-03-21: qty 2000

## 2012-03-21 MED ORDER — POTASSIUM CHLORIDE 10 MEQ/50ML IV SOLN
10.0000 meq | INTRAVENOUS | Status: AC
  Administered 2012-03-21 (×5): 10 meq via INTRAVENOUS
  Filled 2012-03-21 (×5): qty 50

## 2012-03-21 MED ORDER — WARFARIN SODIUM 5 MG PO TABS
5.0000 mg | ORAL_TABLET | Freq: Once | ORAL | Status: AC
  Administered 2012-03-21: 5 mg via ORAL
  Filled 2012-03-21: qty 1

## 2012-03-21 NOTE — Progress Notes (Signed)
Nutrition Follow-up  Intervention:   1.  Parenteral nutrition; per PharmD management.  Appreciate careful wean due to pt's slow progression with diet. 2.  General healthful diet; encourage intake.  Discussed nutrition-related goals with pt.  Continue with Resource Breeze BID and work to increase portion size at meals.  Pt does not endorse "problem foods" and reports he has been able to order meals per his preference.  Assessment:   Pt is consuming 25-40% of meals.  Is consuming Resource Breeze BID for additional 700 kcal, 26g protein. Pt states he has been eating smaller portions for about 6 months due to pain. Able to get OOB with PT and walk some.  Diet Order:  Regular  Meds: Scheduled Meds:   . antiseptic oral rinse  15 mL Mouth Rinse q12n4p  . enoxaparin (LOVENOX) injection  65 mg Subcutaneous Q12H  . feeding supplement  1 Container Oral TID BM  . magnesium sulfate 1 - 4 g bolus IVPB  1 g Intravenous Once  . multivitamin with minerals  1 tablet Oral Daily  . potassium chloride  10 mEq Intravenous Q1 Hr x 5  . sodium chloride  10-40 mL Intracatheter Q12H  . warfarin  5 mg Oral ONCE-1800  . Warfarin - Pharmacist Dosing Inpatient   Does not apply q1800  . DISCONTD: ceFEPime (MAXIPIME) IV  1 g Intravenous Q8H  . DISCONTD: ertapenem (INVANZ) IV  1 g Intravenous Q24H  . DISCONTD: levofloxacin (LEVAQUIN) IV  750 mg Intravenous Q24H  . DISCONTD: vancomycin  750 mg Intravenous Q8H  . DISCONTD: vancomycin  1,000 mg Intravenous Once  . DISCONTD: warfarin  7.5 mg Oral ONCE-1800   Continuous Infusions:   . sodium chloride 30 mL/hr at 03/20/12 0328  . TPN (CLINIMIX) +/- additives 60 mL/hr at 03/20/12 1900   And  . fat emulsion 250 mL (03/20/12 1823)  . TPN (CLINIMIX) +/- additives 60 mL/hr at 03/19/12 1756  . TPN (CLINIMIX) +/- additives     PRN Meds:.HYDROmorphone (DILAUDID) injection, iohexol, ondansetron (ZOFRAN) IV, ondansetron, sodium chloride  Labs:  CMP     Component Value  Date/Time   NA 133* 03/21/2012 0500   K 3.5 03/21/2012 0500   CL 97 03/21/2012 0500   CO2 29 03/21/2012 0500   GLUCOSE 115* 03/21/2012 0500   GLUCOSE 101 02/26/2008 1535   BUN 8 03/21/2012 0500   CREATININE 0.43* 03/21/2012 0500   CALCIUM 8.0* 03/21/2012 0500   PROT 5.5* 03/21/2012 0500   ALBUMIN 1.4* 03/21/2012 0500   AST 23 03/21/2012 0500   ALT 38 03/21/2012 0500   ALKPHOS 256* 03/21/2012 0500   BILITOT 0.4 03/21/2012 0500   GFRNONAA >90 03/21/2012 0500   GFRAA >90 03/21/2012 0500   Lipid Panel     Component Value Date/Time   CHOL 129 03/18/2012 0417   TRIG 186* 03/18/2012 0417   HDL 42.90 09/08/2011 1307   CHOLHDL 5 09/08/2011 1307   VLDL 20.0 09/08/2011 1307   LDLCALC 137* 09/08/2011 1307    Intake/Output Summary (Last 24 hours) at 03/21/12 1501 Last data filed at 03/21/12 0720  Gross per 24 hour  Intake   1040 ml  Output   1925 ml  Net   -885 ml    Weight Status:  No new wt  Re-estimated needs:  1800-2000 kcal, 80-100g protein per day  Nutrition Dx:  Inadequate oral intake, ongoing  Monitor:   1.  Food/Beverage; improvement in intake to >50% of meals. Continue with supplements BID. 2.  Wt/wt change; monitor trends 3.  Nutrition-related labs; while on TPN   Loyce Dys, MS RD LDN Clinical Inpatient Dietitian Pager: [phone] Weekend/After hours pager: [phone]

## 2012-03-21 NOTE — Progress Notes (Signed)
Subjective: Patient seen, reviewed the CT scan results with radiologist Dr Margo Aye. He does not think that the patient has pneumonia, patient has splenic infarction from hypercoagulable state and left sided small pleural effusion which cannot be tapped.  Objective: Vital signs in last 24 hours: Temp:  [98.3 F (36.8 C)-99.1 F (37.3 C)] 98.4 F (36.9 C) (08/15 0944) Pulse Rate:  [80-105] 97  (08/15 0944) Resp:  [16-18] 18  (08/15 0944) BP: (108-157)/(67-91) 108/75 mmHg (08/15 0944) SpO2:  [97 %-98 %] 98 % (08/15 0944) Weight change:  Last BM Date: 03/20/12  Intake/Output from previous day: 08/14 0701 - 08/15 0700 In: 2350 [P.O.:640; I.V.:240; IV Piggyback:750; TPN:720] Out: 2575 [Urine:2050; Stool:525] Total I/O In: -  Out: 600 [Urine:500; Stool:100]   Physical Exam: Head: Normocephalic, atraumatic.  Eyes: No signs of jaundice, EOMI Nose: Mucous membranes dry.  Neck: supple,No deformities, masses, or tenderness noted. Lungs: Normal respiratory effort. B/L Clear to auscultation, no crackles or wheezes.  Heart: Regular RR. S1 and S2 normal  Abdomen: BS normoactive. Soft, Nondistended, non-tender.  Extremities: No pretibial edema, no erythema   Lab Results: Basic Metabolic Panel:  Basename 03/21/12 0500 03/20/12 0500  NA 133* 132*  K 3.5 3.7  CL 97 97  CO2 29 27  GLUCOSE 115* 111*  BUN 8 7  CREATININE 0.43* 0.44*  CALCIUM 8.0* 7.6*  MG 1.9 1.9  PHOS 4.2 4.3   Liver Function Tests:  Basename 03/21/12 0500  AST 23  ALT 38  ALKPHOS 256*  BILITOT 0.4  PROT 5.5*  ALBUMIN 1.4*   No results found for this basename: LIPASE:2,AMYLASE:2 in the last 72 hours No results found for this basename: AMMONIA:2 in the last 72 hours CBC:  Basename 03/21/12 0500 03/20/12 0500  WBC 14.0* 14.0*  NEUTROABS -- --  HGB 8.9* 9.2*  HCT 26.9* 27.5*  MCV 88.5 87.3  PLT 749* 628*   Coagulation:  Basename 03/21/12 0500 03/20/12 0500  LABPROT 18.7* 15.1  INR 1.53* 1.17   Urine  Drug Screen: Drugs of Abuse  No results found for this basename: labopia, cocainscrnur, labbenz, amphetmu, thcu, labbarb     No results found for this or any previous visit (from the past 240 hour(s)).  Studies/Results: Dg Chest 2 View  03/20/2012  **ADDENDUM** CREATED: 03/20/2012 13:38:02  These results were called to Dr. Johna Sheriff.  The patient had recent abdominal surgery (8 days ago).  The patient's abdominal exam is benign.  Therefore, the free air is likely related to prior surgery.  **END ADDENDUM** SIGNED BY: Aubery Lapping. [place], M.D.   03/20/2012  *RADIOLOGY REPORT*  Clinical Data: Left pleuritic chest pain.  CHEST - 2 VIEW  Comparison: Chest x-ray 03/01/2012.  Abdominal films 03/07/2012  Findings: Right PICC line has been placed with the tip in the SVC. Increasing opacity in the left lung base, possible pneumonia.  On the lateral view, there appears to be a moderate left pleural effusion, not well seen on the frontal view.  This may be loculated posteriorly.  Right lung is clear.  Heart is borderline in size.  Free air noted under the hemidiaphragms.  Recommend correlation for recent surgery.  If there has not been recent abdominal surgery, findings are concerning for perforated bowel.  IMPRESSION: New left lower lobe airspace opacity concerning for pneumonia. Suspect loculated posterior left pleural effusion.  Pneumoperitoneum, question recent surgery versus bowel perforation.  Original Report Authenticated By: Cyndie Chime, M.D.   Ct Chest W Contrast  03/20/2012  *RADIOLOGY  REPORT*  Clinical Data: 56 year old male with pain.  Pleural effusion, pneumonia. Left upper abdominal pain.  Recent colectomy. Coagulopathy with recent lower extremity vascular surgery for embolectomy.  CT CHEST WITH CONTRAST  Technique:  Multidetector CT imaging of the chest was performed following the standard protocol during bolus administration of intravenous contrast.  Contrast: 80mL OMNIPAQUE IOHEXOL 300 MG/ML  SOLN  chest radiograph from the same day and earlier.  Comparison: Chest radiograph from the same day and earlier.  Findings: A small to moderate volume of pneumoperitoneum in the visualized upper abdomen.  Free fluid in the left upper quadrant, may be primarily associated with small bowel, uncertain. Heterogeneous hypodensity in the inferior spleen.  Fluid also in the gallbladder fossa.  Mild respiratory motion artifact intermittently noted.  Incidental small intramuscular lipoma along the left lateral oblique muscles in the lower thorax.  Right PICC line.  Small to moderate layering left pleural effusion with adjacent compressive atelectasis.  No significant air bronchograms.  No pneumothorax.  Layering opacity in the right lower lobe also most resembles atelectasis.  Centrilobular emphysema bilaterally.  No pneumomediastinum.  No pericardial effusion.  No mediastinal lymphadenopathy.  The aorta and its major abdominal branches and great vessel branches are patent.  Negative thoracic inlet.  No acute osseous abnormality identified.  IMPRESSION: 1.  Small to moderate volume of pneumoperitoneum.  Free fluid in both upper quadrant. Likely these relate to the recent colectomy. 2.  Heterogeneous hypodensity in the spleen most suspicious for splenic infarcts in this setting. 3.  Small to moderate layering left pleural effusion with compressive atelectasis.  No pneumothorax, pneumomediastinum, or pneumonia. 4.  Emphysema.  Study discussed by telephone with Dr. Mauro Kaufmann on 03/20/2012 at 1530 hours.  Original Report Authenticated By: Harley Hallmark, M.D.    Medications: Scheduled Meds:   . antiseptic oral rinse  15 mL Mouth Rinse q12n4p  . enoxaparin (LOVENOX) injection  65 mg Subcutaneous Q12H  . feeding supplement  1 Container Oral TID BM  . magnesium sulfate 1 - 4 g bolus IVPB  1 g Intravenous Once  . multivitamin with minerals  1 tablet Oral Daily  . potassium chloride  10 mEq Intravenous Q1 Hr x 5  . sodium  chloride  10-40 mL Intracatheter Q12H  . warfarin  7.5 mg Oral ONCE-1800  . Warfarin - Pharmacist Dosing Inpatient   Does not apply q1800  . DISCONTD: ceFEPime (MAXIPIME) IV  1 g Intravenous Q8H  . DISCONTD: ertapenem (INVANZ) IV  1 g Intravenous Q24H  . DISCONTD: levofloxacin (LEVAQUIN) IV  750 mg Intravenous Q24H  . DISCONTD: vancomycin  750 mg Intravenous Q8H  . DISCONTD: vancomycin  1,000 mg Intravenous Once   Continuous Infusions:   . sodium chloride 30 mL/hr at 03/20/12 0328  . TPN (CLINIMIX) +/- additives 60 mL/hr at 03/20/12 1900   And  . fat emulsion 250 mL (03/20/12 1823)  . TPN (CLINIMIX) +/- additives 60 mL/hr at 03/19/12 1756  . TPN (CLINIMIX) +/- additives    . DISCONTD: fat emulsion    . DISCONTD: TPN (CLINIMIX) +/- additives     PRN Meds:.HYDROmorphone (DILAUDID) injection, iohexol, ondansetron (ZOFRAN) IV, ondansetron, sodium chloride  Assessment/Plan:  Active Problems:  Pleural effusion  Splenic infarction  Left sided Pleural effusion Secondary to splenic infarction. No pneumonia on CT scan Will d/c the antibiotics.  Leukocytosis  Invanz d/ced by Dr Johna Sheriff, he planned to stop the antibiotics Increased wbc is due to splenic infarction  Splenic infarction Secondary  to hypercoagulable state Patient is currently on warfarin.  Will sign off, call us if needed.   LOS: 10 days   [facility] Pager: [phone] 03/21/2012, 9:50 AM

## 2012-03-21 NOTE — Progress Notes (Signed)
Patient ID: Rick Edwards, male   DOB: Feb 14, 1956, 56 y.o.   MRN: [id number] The patient has controlled discomfort in his foot. His surgical incisions are all healing quite nicely. He has a 2-3+ popliteal pulse and 2+ dorsalis pedis and posterior tibial pulse at the ankle. His per second and third toes remained dusky. I discussed this with the patient explaining that would allow [place] to demarcate and he may eventually require toe amputations and even possibly transmetatarsal amputation. He remained stable from a vascular surgical standpoint

## 2012-03-21 NOTE — Progress Notes (Signed)
Patient ID: Rick Edwards, male   DOB: 22-Jan-1956, 56 y.o.   MRN: [id number] 9 Days Post-Op  Subjective:  Doing "pretty good".  Still some left flank pain but a little better. No generalized abd pain. Eating a little more.  Walked to the door with PT  Objective: Vital signs in last 24 hours: Temp:  [98.3 F (36.8 C)-99.1 F (37.3 C)] 98.8 F (37.1 C) (08/15 0647) Pulse Rate:  [80-105] 88  (08/15 0647) Resp:  [16-18] 16  (08/15 0647) BP: (122-157)/(67-91) 157/91 mmHg (08/15 0647) SpO2:  [97 %-98 %] 97 % (08/15 0647) Last BM Date: 03/20/12  Intake/Output from previous day: 08/14 0701 - 08/15 0700 In: 2350 [P.O.:640; I.V.:240; IV Piggyback:750; TPN:720] Out: 2575 [Urine:2050; Stool:525] Intake/Output this shift: Total I/O In: -  Out: 600 [Urine:500; Stool:100]  General appearance: alert, cooperative and no distress Resp: clear to auscultation bilaterally GI: normal findings: soft, non-tender Incision/Wound: Dressed, clean and dry  Lab Results:   Basename 03/21/12 0500 03/20/12 0500  WBC 14.0* 14.0*  HGB 8.9* 9.2*  HCT 26.9* 27.5*  PLT 749* 628*   BMET  Basename 03/21/12 0500 03/20/12 0500  NA 133* 132*  K 3.5 3.7  CL 97 97  CO2 29 27  GLUCOSE 115* 111*  BUN 8 7  CREATININE 0.43* 0.44*  CALCIUM 8.0* 7.6*     Studies/Results: Dg Chest 2 View  03/20/2012  **ADDENDUM** CREATED: 03/20/2012 13:38:02  These results were called to Dr. Johna Sheriff.  The patient had recent abdominal surgery (8 days ago).  The patient's abdominal exam is benign.  Therefore, the free air is likely related to prior surgery.  **END ADDENDUM** SIGNED BY: Aubery Lapping. [place], M.D.   03/20/2012  *RADIOLOGY REPORT*  Clinical Data: Left pleuritic chest pain.  CHEST - 2 VIEW  Comparison: Chest x-ray 03/01/2012.  Abdominal films 03/07/2012  Findings: Right PICC line has been placed with the tip in the SVC. Increasing opacity in the left lung base, possible pneumonia.  On the lateral view, there appears to  be a moderate left pleural effusion, not well seen on the frontal view.  This may be loculated posteriorly.  Right lung is clear.  Heart is borderline in size.  Free air noted under the hemidiaphragms.  Recommend correlation for recent surgery.  If there has not been recent abdominal surgery, findings are concerning for perforated bowel.  IMPRESSION: New left lower lobe airspace opacity concerning for pneumonia. Suspect loculated posterior left pleural effusion.  Pneumoperitoneum, question recent surgery versus bowel perforation.  Original Report Authenticated By: Cyndie Chime, M.D.   Ct Chest W Contrast  03/20/2012  *RADIOLOGY REPORT*  Clinical Data: 56 year old male with pain.  Pleural effusion, pneumonia. Left upper abdominal pain.  Recent colectomy. Coagulopathy with recent lower extremity vascular surgery for embolectomy.  CT CHEST WITH CONTRAST  Technique:  Multidetector CT imaging of the chest was performed following the standard protocol during bolus administration of intravenous contrast.  Contrast: 80mL OMNIPAQUE IOHEXOL 300 MG/ML  SOLN chest radiograph from the same day and earlier.  Comparison: Chest radiograph from the same day and earlier.  Findings: A small to moderate volume of pneumoperitoneum in the visualized upper abdomen.  Free fluid in the left upper quadrant, may be primarily associated with small bowel, uncertain. Heterogeneous hypodensity in the inferior spleen.  Fluid also in the gallbladder fossa.  Mild respiratory motion artifact intermittently noted.  Incidental small intramuscular lipoma along the left lateral oblique muscles in the lower thorax.  Right  PICC line.  Small to moderate layering left pleural effusion with adjacent compressive atelectasis.  No significant air bronchograms.  No pneumothorax.  Layering opacity in the right lower lobe also most resembles atelectasis.  Centrilobular emphysema bilaterally.  No pneumomediastinum.  No pericardial effusion.  No mediastinal  lymphadenopathy.  The aorta and its major abdominal branches and great vessel branches are patent.  Negative thoracic inlet.  No acute osseous abnormality identified.  IMPRESSION: 1.  Small to moderate volume of pneumoperitoneum.  Free fluid in both upper quadrant. Likely these relate to the recent colectomy. 2.  Heterogeneous hypodensity in the spleen most suspicious for splenic infarcts in this setting. 3.  Small to moderate layering left pleural effusion with compressive atelectasis.  No pneumothorax, pneumomediastinum, or pneumonia. 4.  Emphysema.  Study discussed by telephone with Dr. Mauro Kaufmann on 03/20/2012 at 1530 hours.  Original Report Authenticated By: Harley Hallmark, M.D.    Anti-infectives: Anti-infectives     Start     Dose/Rate Route Frequency Ordered Stop   03/21/12 0000   vancomycin (VANCOCIN) 750 mg in sodium chloride 0.9 % 150 mL IVPB        750 mg 150 mL/hr over 60 Minutes Intravenous Every 8 hours 03/20/12 1550     03/20/12 1800   levofloxacin (LEVAQUIN) IVPB 750 mg        750 mg 100 mL/hr over 90 Minutes Intravenous Every 24 hours 03/20/12 1553     03/20/12 1700   ceFEPIme (MAXIPIME) 1 g in dextrose 5 % 50 mL IVPB        1 g 100 mL/hr over 30 Minutes Intravenous 3 times per day 03/20/12 1553     03/20/12 1600   vancomycin (VANCOCIN) IVPB 1000 mg/200 mL premix        1,000 mg 200 mL/hr over 60 Minutes Intravenous  Once 03/20/12 1550     03/11/12 1930   ertapenem (INVANZ) 1 g in sodium chloride 0.9 % 50 mL IVPB  Status:  Discontinued        1 g 100 mL/hr over 30 Minutes Intravenous Every 24 hours 03/11/12 1835 03/20/12 1550   03/11/12 0809   ertapenem (INVANZ) 1 g in sodium chloride 0.9 % 50 mL IVPB        1 g 100 mL/hr over 30 Minutes Intravenous 60 min pre-op 03/11/12 0809 03/11/12 1100          Assessment/Plan: s/p Procedure(s): EMBOLECTOMY INTRA OPERATIVE ARTERIOGRAM Abdominal colectomy, ileostomy CT yesterday shows splenic infarct, explains left flank  pain.  No pneumonia Will D/C abx Cont PT, wound care, nutrition May need SNF   LOS: 10 days    Melane Windholz T 03/21/2012

## 2012-03-21 NOTE — Progress Notes (Signed)
PARENTERAL NUTRITION CONSULT NOTE - Follow Up  Pharmacy Consult for TNA Indication: Severe pre-op malnutrition and expected post op ileus  Patient Measurements: Height: 5\' 9"  (175.3 cm) Weight: 145 lb 1 oz (65.8 kg) IBW/kg (Calculated) : 70.7  Usual Weight: ~70 kg  Vital Signs: Temp: 98.8 F (37.1 C) (08/15 0647) Temp src: Oral (08/15 0647) BP: 157/91 mmHg (08/15 0647) Pulse Rate: 88  (08/15 0647) Intake/Output from previous day: 08/14 0701 - 08/15 0700 In: 2350 [P.O.:640; I.V.:240; IV Piggyback:750; TPN:720] Out: 2575 [Urine:2050; Stool:525] Intake/Output from this shift: Total I/O In: -  Out: 600 [Urine:500; Stool:100]  Labs:  Basename 03/21/12 0500 03/20/12 0500 03/19/12 0400  WBC 14.0* 14.0* 11.1*  HGB 8.9* 9.2* 9.5*  HCT 26.9* 27.5* 27.5*  PLT 749* 628* 493*  APTT -- -- --  INR 1.53* 1.17 1.57*     Basename 03/21/12 0500 03/20/12 0500 03/19/12 0400  NA 133* 132* 133*  K 3.5 3.7 3.6  CL 97 97 99  CO2 29 27 28   GLUCOSE 115* 111* 107*  BUN 8 7 5*  CREATININE 0.43* 0.44* 0.42*  LABCREA -- -- --  CREAT24HRUR -- -- --  CALCIUM 8.0* 7.6* 7.7*  MG 1.9 1.9 2.0  PHOS 4.2 4.3 4.5  PROT 5.5* -- --  ALBUMIN 1.4* -- --  AST 23 -- --  ALT 38 -- --  ALKPHOS 256* -- --  BILITOT 0.4 -- --  BILIDIR -- -- --  IBILI -- -- --  PREALBUMIN -- -- --  TRIG -- -- --  CHOLHDL -- -- --  CHOL -- -- --   Estimated Creatinine Clearance: 99.4 ml/min (by C-G formula based on Cr of 0.43).   No results found for this basename: GLUCAP:3 in the last 72 hours CBGs & Insulin requirements past 24 hours:  SSI d/c d/t good glucose control  Nutritional Goals: RD recommendation: 1800-2000 kcal/day 80-100 g protein/day Goal is Clinimix E 5/15 at 78ml/hr and IVFE 20% at 10 ml/hr on MWF = 96gm protein (1.5 g/kg/d), avg of 1600 KCal  Current Diet: Clinimix E 5/15 at 60 cc/hr + 20% Lipids at 10 cc/hr on MWF provide an average of 1228 kcal and 72g protein per day [employer] 3x/day  between meals (charted as given) On 8/13: 25% of breakfast, 25% of lunch, 33% of dinner (per discussion with nurse, not charted)  Assessment:  27 YOM with severe Crohn's colitis of the right colon with hx of perforation and right hemicolectomy with anastomosis 10 years ago. Recently developed increased postprandial pain with 20 pound weight loss over the past month due to lack of appetite. He is now s/p partial colectomy with end ileostomy and repair of enterotomy on 03/11/2012. PICC line ordered 8/5; TNA started 8/7 d/t delay in placing PICC d/t need for urgent thrombectomy for ischemic right foot 8/6.  GI: s/p subtotal colectomy w/ end ileostmy and repair of enterotomy 8/5, TNA started for anticipation of post-op ileus; 525 cc stool noted via ostomy bag; changed to regular diet on 8/14. It is noted patient reports low intake/early satiety -- denies abdominal complaints.  Endo: no h/o DM, CBGs on BMET at goal of <150, SSI d/c 8/12 Lytes: corrected Ca~10.1, Na 133 -- likely continues to be low, d/t fluid overload. MIVF changed to NS 8/12. K 3.5 and slightly below goal of 4. Mg 1.9 and slightly below goal of 2. Renal: SCr stable, UOP good, 1.3 ml/kg/hr Hepatobil:  Alkphos up to 256 << 139, Tbili down to  0.4 << 1.8 elevated, Tbil high. AST/ALT WNL, prealbumin low at BL- still low, but slightly improved- expect slow progress d/t surgeries and ongoing inflammation. Noted Chol WNL, triglycerides incr slightly. Pulm: RA Neuro: GCS 15, Pain 2-10/10, c/o pleuritic chest pain on 8/14, on prn pain meds  Cards: BP elevated (120-160/70-90), HR elevated (80-100). On no CV meds  ID: Ertapenem d/ced and changed to Cefepime + Levaquin + Vanc on 8/14 evening for empiric HCAP coverage -- now all d/ced by MD this a.m. Tmax/24h: 99.1 WBC 14 << 11.1.No micro data. CXR (8/14): new left lower airspace opacity concerning for PNA Best Practices: SCDs, MC, full dose lovenox>>Coumadin  Plan:  1. Continue Clinimix E5/15 to 60  ml/hr tonight (75% goal) to help stimulate additional appetite (patient drinking all resource supplements between meals however still tolerating <50% of regular diet). 2. Give KCl 10 mEq IV x 5 runs today 3. Give Magnesium Sulfate 1g IV x 1 dose today 4. Continue supplementing multivitamin po daily d/t shortage of IV formulation and the patient tolerating some oral intake 5. Provide available trace elements, Lipids at 10 ml/hr MWF only 2/2 national shortage  6. Will continue to follow and monitor diet advancement 7. F/u TPN labs  Georgina Pillion, PharmD, BCPS Clinical Pharmacist Pager: [phone] 03/21/2012 7:51 AM

## 2012-03-21 NOTE — Progress Notes (Signed)
ANTICOAGULATION CONSULT NOTE - Follow Up Consult  Pharmacy Consult for Lovneox Indication: VTE + Hypercoagulable state (AT III Deficiency)  Assessment: 56 y.o male who was on Lovenox bridge to coumadin for VTE, pt. with AT III deficiency, Anti-Xa level (= 0.69) is at goal, obtained ~ 3.5 hrs after today's afternoon dose. Renal function stable. Hgb/Hct/Plt low but stable. No bleeding noted.   Goal of Therapy:  INR 2-3 Anti-Xa level 0.6-1.2 units/ml 4hrs after LMWH dose given Monitor platelets by anticoagulation protocol: Yes   Plan:  1. Continue Lovenox 65 mg SQ every 12 hours 2. Will continue to monitor for any signs/symptoms of bleeding, renal function for dose adjustments with lovenox and PT/INR in the a.m.   Bayard Hugger, PharmD, BCPS  Clinical Pharmacist  Pager: [phone]  03/21/2012 7:19 PM      Allergies  Allergen Reactions  . Mesalamine     REACTION: Rash    Patient Measurements: Height: 5\' 9"  (175.3 cm) Weight: 145 lb 1 oz (65.8 kg) IBW/kg (Calculated) : 70.7   Vital Signs: Temp: 98.5 F (36.9 C) (08/15 1849) Temp src: Oral (08/15 1849) BP: 105/78 mmHg (08/15 1849) Pulse Rate: 98  (08/15 1849)  Labs:  Basename 03/21/12 1848 03/21/12 0500 03/20/12 0500 03/19/12 0400  HGB -- 8.9* 9.2* --  HCT -- 26.9* 27.5* 27.5*  PLT -- 749* 628* 493*  APTT -- -- -- --  LABPROT -- 18.7* 15.1 19.1*  INR -- 1.53* 1.17 1.57*  HEPARINUNFRC 0.69 -- -- --  CREATININE -- 0.43* 0.44* 0.42*  CKTOTAL -- -- -- --  CKMB -- -- -- --  TROPONINI -- -- -- --    Estimated Creatinine Clearance: 99.4 ml/min (by C-G formula based on Cr of 0.43).

## 2012-03-21 NOTE — Progress Notes (Signed)
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 Mesalamine     REACTION: Rash    Patient Measurements: Height: 5\' 9"  (175.3 cm) Weight: 145 lb 1 oz (65.8 kg) IBW/kg (Calculated) : 70.7   Vital Signs: Temp: 98.4 F (36.9 C) (08/15 0944) Temp src: Oral (08/15 0944) BP: 108/75 mmHg (08/15 0944) Pulse Rate: 97  (08/15 0944)  Labs:  Basename 03/21/12 0500 03/20/12 0500 03/19/12 0400  HGB 8.9* 9.2* --  HCT 26.9* 27.5* 27.5*  PLT 749* 628* 493*  APTT -- -- --  LABPROT 18.7* 15.1 19.1*  INR 1.53* 1.17 1.57*  HEPARINUNFRC -- -- --  CREATININE 0.43* 0.44* 0.42*  CKTOTAL -- -- --  CKMB -- -- --  TROPONINI -- -- --    Estimated Creatinine Clearance: 99.4 ml/min (by C-G formula based on Cr of 0.43).   Assessment: 57 y.o male who was on heparin for VTE s/p R tibial embolectomy, however HL remained subtherapeutic despite increasing dose. 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 Heparin was switched to lovenox since less dependant on AT for anticoagulation.   The patient's warfarin dose was not given last night (not charted and still in patient's medication drawer this morning). As a result, INR today remains SUBtherapeutic (INR 1.53 << 1.17, goal of 2-3) however it trended up despite no dose. The patient was noted to have a splenic infarct on a CT scan yesterday. Will check anti-Xa level this evening to ensure Lovenox bridging appropriately. Will not increase warfarin dose given INR elevation despite dose on 8/14. Renal function stable. Hgb/Hct/Plt low but stable. No bleeding noted.   Goal of Therapy:  INR 2-3 Anti-Xa level 0.6-1.2 units/ml 4hrs after LMWH dose given Monitor platelets by anticoagulation protocol: Yes   Plan:  1. Continue Lovenox 65 mg SQ every 12 hours 2. Warfarin 5 mg x 1 dose at 1800 today 3. Obtain anti-Xa level ~4  hours after today's afternoon dose 4. Will continue to monitor for any signs/symptoms of bleeding, renal function for dose adjustments with lovenox, and will follow up with anti-Xa level this afternoon and PT/INR in the a.m.   Georgina Pillion, PharmD, BCPS Clinical Pharmacist Pager: [phone] 03/21/2012 12:40 PM

## 2012-03-22 LAB — CBC
MCH: 30 pg (ref 26.0–34.0)
MCHC: 33.2 g/dL (ref 30.0–36.0)
Platelets: 890 10*3/uL — ABNORMAL HIGH (ref 150–400)
RDW: 15.3 % (ref 11.5–15.5)

## 2012-03-22 LAB — PROTIME-INR: Prothrombin Time: 16 seconds — ABNORMAL HIGH (ref 11.6–15.2)

## 2012-03-22 MED ORDER — FENTANYL 25 MCG/HR TD PT72
25.0000 ug | MEDICATED_PATCH | TRANSDERMAL | Status: DC
  Administered 2012-03-22 – 2012-03-25 (×2): 25 ug via TRANSDERMAL
  Filled 2012-03-22 (×2): qty 1

## 2012-03-22 MED ORDER — FAT EMULSION 20 % IV EMUL
250.0000 mL | INTRAVENOUS | Status: DC
  Administered 2012-03-22: 250 mL via INTRAVENOUS
  Filled 2012-03-22: qty 250

## 2012-03-22 MED ORDER — SELENIUM 40 MCG/ML IV SOLN
INTRAVENOUS | Status: DC
  Administered 2012-03-22: 18:00:00 via INTRAVENOUS
  Filled 2012-03-22: qty 2000

## 2012-03-22 MED ORDER — WARFARIN SODIUM 7.5 MG PO TABS
7.5000 mg | ORAL_TABLET | Freq: Once | ORAL | Status: AC
  Administered 2012-03-22: 7.5 mg via ORAL
  Filled 2012-03-22: qty 1

## 2012-03-22 NOTE — Progress Notes (Signed)
PARENTERAL NUTRITION CONSULT NOTE - Follow Up  Pharmacy Consult for TNA Indication: Severe pre-op malnutrition and expected post op ileus  Patient Measurements: Height: 5\' 9"  (175.3 cm) Weight: 145 lb 1 oz (65.8 kg) IBW/kg (Calculated) : 70.7  Usual Weight: ~70 kg  Vital Signs: Temp: 98.1 F (36.7 C) (08/16 0555) Temp src: Oral (08/16 0555) BP: 144/84 mmHg (08/16 0555) Pulse Rate: 88  (08/16 0555) Intake/Output from previous day: 08/15 0701 - 08/16 0700 In: 3657.6 [I.V.:1531.2; TPN:2126.4] Out: 1775 [Urine:1625; Stool:150] Intake/Output from this shift: Total I/O In: -  Out: 325 [Urine:325]  Labs:  [facility] 03/22/12 0541 03/21/12 0500 03/20/12 0500  WBC 13.5* 14.0* 14.0*  HGB 8.9* 8.9* 9.2*  HCT 26.8* 26.9* 27.5*  PLT 890* 749* 628*  APTT -- -- --  INR 1.25 1.53* 1.17     Basename 03/21/12 0500 03/20/12 0500  NA 133* 132*  K 3.5 3.7  CL 97 97  CO2 29 27  GLUCOSE 115* 111*  BUN 8 7  CREATININE 0.43* 0.44*  LABCREA -- --  CREAT24HRUR -- --  CALCIUM 8.0* 7.6*  MG 1.9 1.9  PHOS 4.2 4.3  PROT 5.5* --  ALBUMIN 1.4* --  AST 23 --  ALT 38 --  ALKPHOS 256* --  BILITOT 0.4 --  BILIDIR -- --  IBILI -- --  PREALBUMIN -- --  TRIG -- --  CHOLHDL -- --  CHOL -- --   Estimated Creatinine Clearance: 99.4 ml/min (by C-G formula based on Cr of 0.43).   No results found for this basename: GLUCAP:3 in the last 72 hours CBGs & Insulin requirements past 24 hours:  SSI d/c d/t good glucose control  Nutritional Goals: RD recommendation: 1800-2000 kcal/day 80-100 g protein/day Goal is Clinimix E 5/15 at 66ml/hr and IVFE 20% at 10 ml/hr on MWF = 96gm protein (1.5 g/kg/d), avg of 1600 KCal  Current Diet: Clinimix E 5/15 at 60 cc/hr + 20% Lipids at 10 cc/hr on MWF provide an average of 1228 kcal and 72g protein per day [employer] 3x/day between meals (charted as given) On 8/13: 25% of breakfast, 25% of lunch, 33% of dinner (per discussion with nurse, not  charted)  Assessment:  24 YOM with severe Crohn's colitis of the right colon with hx of perforation and right hemicolectomy with anastomosis 10 years ago. Recently developed increased postprandial pain with 20 pound weight loss over the past month due to lack of appetite. He is now s/p partial colectomy with end ileostomy and repair of enterotomy on 03/11/2012. PICC line ordered 8/5; TNA started 8/7 d/t delay in placing PICC d/t need for urgent thrombectomy for ischemic right foot 8/6.  GI: s/p subtotal colectomy w/ end ileostmy and repair of enterotomy 8/5, TNA started for anticipation of post-op ileus; 525 cc stool noted via ostomy bag; changed to regular diet on 8/14. He has tolerated diet somewhat better but not at 100% yet.  MD wants to continue TNA today but hopefully can discontinue soon and allow regular diet.  Endo: no h/o DM, CBGs at goal of <150 on 8/12 but SSI d/c 8/12.  BMET yesterday with glucose at 115. Lytes: No new BMET today, lytes slightly below desired goal ranges.  Supplements giving yesterday.  Will get a BMET on Monday and re-evaluate. Renal: SCr stable, UOP good, 1 ml/kg/hr Hepatobil:  Alkphos up to 256 << 139, Tbili down to 0.4 << 1.8 elevated, Tbil high. AST/ALT WNL, prealbumin low at BL- still low, but slightly improved- expect  slow progress d/t surgeries and ongoing inflammation. Noted Chol WNL, triglycerides incr slightly. Pulm: RA Neuro: GCS 15, Pain 2-10/10, c/o pleuritic chest pain on 8/14, on prn pain meds  Cards: BP elevated (120-160/70-90), HR elevated (80-100). On no CV meds  ID: Ertapenem d/ced and changed to Cefepime + Levaquin + Vanc on 8/14 evening for empiric HCAP coverage -- now all d/ced by MD this a.m. Tmax/24h: 99.1 WBC 14 << 11.1.No micro data. CXR (8/14): new left lower airspace opacity concerning for PNA Heme:  Thrombocytosis with platelets to 890K today.  Hx. of anemia noted. Best Practices: SCDs, MC, full dose lovenox>>Coumadin  Plan:  1. Continue  Clinimix E5/15 at 60 ml/hr to encourage ongoing appetite tolerance.  He is drinking all resource supplements between meals however still tolerating <50% of regular diet). 2. Continue supplementing multivitamin po daily d/t shortage of IV formulation and the patient tolerating some oral intake 3. Provide available trace elements, Lipids at 10 ml/hr MWF only 2/2 national shortage  4. Will continue to follow and monitor diet advancement 5. F/u TPN labs  Nadara Mustard, PharmD., MS Clinical Pharmacist Pager:  [phone] Thank you for allowing pharmacy to be part of this patients care team. 03/22/2012 9:24 AM

## 2012-03-22 NOTE — Progress Notes (Signed)
Patient ID: Rick Edwards, male   DOB: Jan 13, 1956, 56 y.o.   MRN: [id number] 10 Days Post-Op  Subjective: Still sharp LUQ pain with deep breath but better than yesterday.  No generalized abd pain.  Eating better.  Still with R foot pain when trying to walk  Objective: Vital signs in last 24 hours: Temp:  [98.1 F (36.7 C)-99 F (37.2 C)] 98.1 F (36.7 C) (08/16 0555) Pulse Rate:  [88-98] 88  (08/16 0555) Resp:  [18] 18  (08/16 0555) BP: (105-144)/(75-84) 144/84 mmHg (08/16 0555) SpO2:  [98 %-100 %] 100 % (08/16 0555) Last BM Date: 03/21/12  Intake/Output from previous day: 08/15 0701 - 08/16 0700 In: 3657.6 [I.V.:1531.2; TPN:2126.4] Out: 1775 [Urine:1625; Stool:150] Intake/Output this shift: Total I/O In: -  Out: 325 [Urine:325]  General appearance: alert and no distress Lungs: Clear equal breath sounds GI: normal findings: soft, non-tender and non distended, stoma healthy and functioning Incision/Wound: Open, packed, mostly clean granulation Extremities: Stable ischemic changes distal toes R foot  Lab Results:   Basename 03/22/12 0541 03/21/12 0500  WBC 13.5* 14.0*  HGB 8.9* 8.9*  HCT 26.8* 26.9*  PLT 890* 749*   BMET  Basename 03/21/12 0500 03/20/12 0500  NA 133* 132*  K 3.5 3.7  CL 97 97  CO2 29 27  GLUCOSE 115* 111*  BUN 8 7  CREATININE 0.43* 0.44*  CALCIUM 8.0* 7.6*   Lab Results  Component Value Date   INR 1.25 03/22/2012   INR 1.53* 03/21/2012   INR 1.17 03/20/2012     Studies/Results: Dg Chest 2 View  03/20/2012  **ADDENDUM** CREATED: 03/20/2012 13:38:02  These results were called to Dr. Johna Sheriff.  The patient had recent abdominal surgery (8 days ago).  The patient's abdominal exam is benign.  Therefore, the free air is likely related to prior surgery.  **END ADDENDUM** SIGNED BY: Aubery Lapping. [place], M.D.   03/20/2012  *RADIOLOGY REPORT*  Clinical Data: Left pleuritic chest pain.  CHEST - 2 VIEW  Comparison: Chest x-ray 03/01/2012.  Abdominal films  03/07/2012  Findings: Right PICC line has been placed with the tip in the SVC. Increasing opacity in the left lung base, possible pneumonia.  On the lateral view, there appears to be a moderate left pleural effusion, not well seen on the frontal view.  This may be loculated posteriorly.  Right lung is clear.  Heart is borderline in size.  Free air noted under the hemidiaphragms.  Recommend correlation for recent surgery.  If there has not been recent abdominal surgery, findings are concerning for perforated bowel.  IMPRESSION: New left lower lobe airspace opacity concerning for pneumonia. Suspect loculated posterior left pleural effusion.  Pneumoperitoneum, question recent surgery versus bowel perforation.  Original Report Authenticated By: Cyndie Chime, M.D.   Ct Chest W Contrast  03/20/2012  *RADIOLOGY REPORT*  Clinical Data: 56 year old male with pain.  Pleural effusion, pneumonia. Left upper abdominal pain.  Recent colectomy. Coagulopathy with recent lower extremity vascular surgery for embolectomy.  CT CHEST WITH CONTRAST  Technique:  Multidetector CT imaging of the chest was performed following the standard protocol during bolus administration of intravenous contrast.  Contrast: 80mL OMNIPAQUE IOHEXOL 300 MG/ML  SOLN chest radiograph from the same day and earlier.  Comparison: Chest radiograph from the same day and earlier.  Findings: A small to moderate volume of pneumoperitoneum in the visualized upper abdomen.  Free fluid in the left upper quadrant, may be primarily associated with small bowel, uncertain. Heterogeneous hypodensity in  the inferior spleen.  Fluid also in the gallbladder fossa.  Mild respiratory motion artifact intermittently noted.  Incidental small intramuscular lipoma along the left lateral oblique muscles in the lower thorax.  Right PICC line.  Small to moderate layering left pleural effusion with adjacent compressive atelectasis.  No significant air bronchograms.  No pneumothorax.   Layering opacity in the right lower lobe also most resembles atelectasis.  Centrilobular emphysema bilaterally.  No pneumomediastinum.  No pericardial effusion.  No mediastinal lymphadenopathy.  The aorta and its major abdominal branches and great vessel branches are patent.  Negative thoracic inlet.  No acute osseous abnormality identified.  IMPRESSION: 1.  Small to moderate volume of pneumoperitoneum.  Free fluid in both upper quadrant. Likely these relate to the recent colectomy. 2.  Heterogeneous hypodensity in the spleen most suspicious for splenic infarcts in this setting. 3.  Small to moderate layering left pleural effusion with compressive atelectasis.  No pneumothorax, pneumomediastinum, or pneumonia. 4.  Emphysema.  Study discussed by telephone with Dr. Mauro Kaufmann on 03/20/2012 at 1530 hours.  Original Report Authenticated By: Harley Hallmark, M.D.    Anti-infectives: Anti-infectives     Start     Dose/Rate Route Frequency Ordered Stop   03/21/12 0000   vancomycin (VANCOCIN) 750 mg in sodium chloride 0.9 % 150 mL IVPB  Status:  Discontinued        750 mg 150 mL/hr over 60 Minutes Intravenous Every 8 hours 03/20/12 1550 03/21/12 0844   03/20/12 1800   levofloxacin (LEVAQUIN) IVPB 750 mg  Status:  Discontinued        750 mg 100 mL/hr over 90 Minutes Intravenous Every 24 hours 03/20/12 1553 03/21/12 0844   03/20/12 1700   ceFEPIme (MAXIPIME) 1 g in dextrose 5 % 50 mL IVPB  Status:  Discontinued        1 g 100 mL/hr over 30 Minutes Intravenous 3 times per day 03/20/12 1553 03/21/12 0844   03/20/12 1600   vancomycin (VANCOCIN) IVPB 1000 mg/200 mL premix  Status:  Discontinued        1,000 mg 200 mL/hr over 60 Minutes Intravenous  Once 03/20/12 1550 03/21/12 0938   03/11/12 1930   ertapenem (INVANZ) 1 g in sodium chloride 0.9 % 50 mL IVPB  Status:  Discontinued        1 g 100 mL/hr over 30 Minutes Intravenous Every 24 hours 03/11/12 1835 03/20/12 1550   03/11/12 0809   ertapenem (INVANZ)  1 g in sodium chloride 0.9 % 50 mL IVPB        1 g 100 mL/hr over 30 Minutes Intravenous 60 min pre-op 03/11/12 0809 03/11/12 1100          Assessment/Plan: s/p Procedure(s): EMBOLECTOMY INTRA OPERATIVE ARTERIOGRAM Total abdominal colectomy and ileostomy Splenic infarct Stable, gradual improvement.  Eating better, off all ABX Cont PT, nutrition, wound care Cont TNA for now but could prob stop soon Hypercoagulable state-on Lovenox, Coumadin per pharmacy   LOS: 11 days    Sheva Mcdougle T 03/22/2012

## 2012-03-22 NOTE — Progress Notes (Signed)
Physical Therapy Treatment Patient Details Name: Rick Edwards MRN: [id number] DOB: 1956/05/17 Today's Date: 03/22/2012 Time: 0347-4259 PT Time Calculation (min): 19 min  PT Assessment / Plan / Recommendation Comments on Treatment Session  Pt with decreased ambulation this session as pain was unbearable. Pt extremely quiet throughout session prior to returning to bed secondary to pain. Continue per plan    Follow Up Recommendations  Home health PT;Other (comment) (HH vs SNF(pending progress))    Barriers to Discharge        Equipment Recommendations  Rolling walker with 5" wheels    Recommendations for Other Services    Frequency Min 4X/week   Plan Discharge plan remains appropriate;Frequency remains appropriate    Precautions / Restrictions Precautions Precautions: Fall Restrictions Weight Bearing Restrictions: No   Pertinent Vitals/Pain 10/10 pain with all mobility    Mobility  Bed Mobility Bed Mobility: Supine to Sit;Sit to Supine Supine to Sit: 4: Min guard Sit to Supine: 4: Min guard;HOB elevated;With rail Details for Bed Mobility Assistance: Minguard for support as pt with increased pain with all mobility Transfers Transfers: Sit to Stand;Stand to Sit Sit to Stand: 4: Min assist;With upper extremity assist;From bed Stand to Sit: 4: Min assist;With upper extremity assist;To bed Details for Transfer Assistance: Min assist for stability as well as safety as pt requires increased cueing for hand placement for safety.  Ambulation/Gait Ambulation/Gait Assistance: 4: Min assist;3: Mod assist Ambulation Distance (Feet): 10 Feet Assistive device: Rolling walker Ambulation/Gait Assistance Details: Pt unable to bear weight through RLE this session. Cueing throughout for proper foot placement as well as safety with RW placement. Pt with unbearable weight with ambulation therefore returned to bed. Gait Pattern: Decreased stride length;Step-to pattern;Right foot  flat;Antalgic;Trunk flexed     PT Goals Acute Rehab PT Goals PT Goal: Sit to Supine/Side - Progress: Progressing toward goal PT Goal: Sit to Stand - Progress: Progressing toward goal PT Goal: Ambulate - Progress: Progressing toward goal  Visit Information  Last PT Received On: 03/22/12 Assistance Needed: +1    Subjective Data  Subjective: "I've never been in this much pain before."   Cognition  Overall Cognitive Status: Appears within functional limits for tasks assessed/performed Arousal/Alertness: Awake/alert Orientation Level: Appears intact for tasks assessed Behavior During Session: [facility] for tasks performed    Balance     End of Session PT - End of Session Equipment Utilized During Treatment: Gait belt Activity Tolerance: Patient limited by pain Patient left: in bed;with call bell/phone within reach Nurse Communication: Mobility status;Patient requests pain meds     Milana Kidney 03/22/2012, 4:47 PM  03/22/2012 Milana Kidney DPT PAGER: [phone] OFFICE: [phone]

## 2012-03-22 NOTE — Progress Notes (Signed)
ANTICOAGULATION CONSULT NOTE - Follow Up Consult  Pharmacy Consult for Lovneox/Warfarin Indication: VTE + Hypercoagulable state (AT III Deficiency)  Assessment: 56 y.o male who was on Lovenox bridge to coumadin for VTE, pt. with AT III deficiency, Anti-Xa level drawn on Lovenox was within goal, obtained ~ 3.5 hrs after dose (ususally 4 hours). Renal function stable. Hgb/Hct/Plt low but stable. No bleeding noted.    Lab Review today:   H/H remains stable but low (8.9/26.8).  He has some thrombocytosis with platelets at 890K.  His INR is down after missed dose on 8/14.  Today INR = 1.25.    Goal of Therapy:  INR 2-3 Anti-Xa level 0.6-1.2 units/ml 4hrs after LMWH dose given Monitor platelets by anticoagulation protocol: Yes   Plan:  1. Continue Lovenox 65 mg SQ every 12 hours 2. Will continue to monitor for any signs/symptoms of bleeding, renal function for dose adjustments with lovenox and PT/INR in the a.m.  3.  Warfarin 7.5mg  x 1 today 4.  F/U AM labs and adjust.  Nadara Mustard, PharmD., MS Clinical Pharmacist Pager:  [phone]  Thank you for allowing pharmacy to be part of this patients care team. 03/21/2012 7:19 PM    Allergies  Allergen Reactions  . Mesalamine     REACTION: Rash   Patient Measurements: Height: 5\' 9"  (175.3 cm) Weight: 145 lb 1 oz (65.8 kg) IBW/kg (Calculated) : 70.7   Vital Signs: Temp: 98.1 F (36.7 C) (08/16 0555) Temp src: Oral (08/16 0555) BP: 144/84 mmHg (08/16 0555) Pulse Rate: 88  (08/16 0555)  Labs:  Basename 03/22/12 0541 03/21/12 1848 03/21/12 0500 03/20/12 0500  HGB 8.9* -- 8.9* --  HCT 26.8* -- 26.9* 27.5*  PLT 890* -- 749* 628*  APTT -- -- -- --  LABPROT 16.0* -- 18.7* 15.1  INR 1.25 -- 1.53* 1.17  HEPARINUNFRC -- 0.69 -- --  CREATININE -- -- 0.43* 0.44*  CKTOTAL -- -- -- --  CKMB -- -- -- --  TROPONINI -- -- -- --   Estimated Creatinine Clearance: 99.4 ml/min (by C-G formula based on Cr of 0.43).

## 2012-03-22 NOTE — Progress Notes (Signed)
[facility] [place]  Progress Note Bypass Surgery  Date of Surgery: 03/11/2012 - 03/12/2012  Procedure(s): EMBOLECTOMY INTRA OPERATIVE ARTERIOGRAM Surgeon: Surgeon(s): Chuck Hint, MD  10 Days Post-Op  History of Present Illness  Rick Edwards is a 56 y.o. male who is S/P Procedure(s): EMBOLECTOMY INTRA OPERATIVE ARTERIOGRAM right.  The patient's pre-op symptoms of pain are Unchanged . Patients pain is well controlled.  The toes are dark and cool to touch.      Significant Diagnostic Studies: CBC Lab Results  Component Value Date   WBC 13.5* 03/22/2012   HGB 8.9* 03/22/2012   HCT 26.8* 03/22/2012   MCV 90.2 03/22/2012   PLT 890* 03/22/2012    BMET     Component Value Date/Time   NA 133* 03/21/2012 0500   K 3.5 03/21/2012 0500   CL 97 03/21/2012 0500   CO2 29 03/21/2012 0500   GLUCOSE 115* 03/21/2012 0500   GLUCOSE 101 02/26/2008 1535   BUN 8 03/21/2012 0500   CREATININE 0.43* 03/21/2012 0500   CALCIUM 8.0* 03/21/2012 0500   GFRNONAA >90 03/21/2012 0500   GFRAA >90 03/21/2012 0500    COAG Lab Results  Component Value Date   INR 1.25 03/22/2012   INR 1.53* 03/21/2012   INR 1.17 03/20/2012   No results found for this basename: PTT    Physical Examination  BP Readings from Last 3 Encounters:  03/22/12 144/84  03/22/12 144/84  03/22/12 144/84   Temp Readings from Last 3 Encounters:  03/22/12 98.1 F (36.7 C) Oral  03/22/12 98.1 F (36.7 C) Oral  03/22/12 98.1 F (36.7 C) Oral   SpO2 Readings from Last 3 Encounters:  03/22/12 100%  03/22/12 100%  03/22/12 100%   Pulse Readings from Last 3 Encounters:  03/22/12 88  03/22/12 88  03/22/12 88    Right lower extremity Palpable popliteal pulse and 2+ dorsalis pedis and posterior tibial pulse. Toes are painful and dark. Incisions are healing well   Assessment/Plan: Pt. Doing well Post-op pain is controlled Wounds are healing well PT/OT for ambulation He may eventually  require toe amputations and even possibly transmetatarsal amputation. He remained stable from a vascular surgical standpoint   Clinton Gallant [facility] [phone] 03/22/2012 10:35 AM

## 2012-03-23 DIAGNOSIS — D7389 Other diseases of spleen: Secondary | ICD-10-CM

## 2012-03-23 DIAGNOSIS — E46 Unspecified protein-calorie malnutrition: Secondary | ICD-10-CM

## 2012-03-23 MED ORDER — DEXTROSE-NACL 5-0.9 % IV SOLN
INTRAVENOUS | Status: DC
  Administered 2012-03-23 – 2012-03-25 (×3): via INTRAVENOUS
  Administered 2012-03-27: 20 mL/h via INTRAVENOUS

## 2012-03-23 MED ORDER — CLINIMIX E/DEXTROSE (5/15) 5 % IV SOLN
INTRAVENOUS | Status: DC
  Filled 2012-03-23: qty 2000

## 2012-03-23 MED ORDER — WARFARIN SODIUM 7.5 MG PO TABS
7.5000 mg | ORAL_TABLET | Freq: Once | ORAL | Status: AC
  Administered 2012-03-23: 7.5 mg via ORAL
  Filled 2012-03-23: qty 1

## 2012-03-23 NOTE — Progress Notes (Signed)
11 Days Post-Op  Subjective: Ate well  Objective: Vital signs in last 24 hours: Temp:  [97.5 F (36.4 C)-99.2 F (37.3 C)] 97.5 F (36.4 C) (08/17 0553) Pulse Rate:  [90-105] 90  (08/17 0553) Resp:  [18] 18  (08/17 0553) BP: (131-136)/(75-85) 132/85 mmHg (08/17 0553) SpO2:  [98 %-100 %] 100 % (08/17 0553) Last BM Date: 03/22/12  Intake/Output from previous day: 08/16 0701 - 08/17 0700 In: 2736.4 [P.O.:600; I.V.:673; HKV:4259.5] Out: 2465 [Urine:1825; Stool:640] Intake/Output this shift: Total I/O In: -  Out: 100 [Stool:100]  General appearance: alert and cooperative Resp: clear to auscultation bilaterally Cardio: regular rate and rhythm GI: soft, good output from ostomy, wound with clean granulation tissue, active BS Extremities: black discoloration R toes and very tender, not open, palp DP  Lab Results:   Basename 03/22/12 0541 03/21/12 0500  WBC 13.5* 14.0*  HGB 8.9* 8.9*  HCT 26.8* 26.9*  PLT 890* 749*   BMET  Basename 03/21/12 0500  NA 133*  K 3.5  CL 97  CO2 29  GLUCOSE 115*  BUN 8  CREATININE 0.43*  CALCIUM 8.0*   PT/INR  Basename 03/23/12 0548 03/22/12 0541  LABPROT 17.3* 16.0*  INR 1.39 1.25   ABG No results found for this basename: PHART:2,PCO2:2,PO2:2,HCO3:2 in the last 72 hours  Studies/Results: No results found.  Anti-infectives: Anti-infectives     Start     Dose/Rate Route Frequency Ordered Stop   03/21/12 0000   vancomycin (VANCOCIN) 750 mg in sodium chloride 0.9 % 150 mL IVPB  Status:  Discontinued        750 mg 150 mL/hr over 60 Minutes Intravenous Every 8 hours 03/20/12 1550 03/21/12 0844   03/20/12 1800   levofloxacin (LEVAQUIN) IVPB 750 mg  Status:  Discontinued        750 mg 100 mL/hr over 90 Minutes Intravenous Every 24 hours 03/20/12 1553 03/21/12 0844   03/20/12 1700   ceFEPIme (MAXIPIME) 1 g in dextrose 5 % 50 mL IVPB  Status:  Discontinued        1 g 100 mL/hr over 30 Minutes Intravenous 3 times per day 03/20/12  1553 03/21/12 0844   03/20/12 1600   vancomycin (VANCOCIN) IVPB 1000 mg/200 mL premix  Status:  Discontinued        1,000 mg 200 mL/hr over 60 Minutes Intravenous  Once 03/20/12 1550 03/21/12 0938   03/11/12 1930   ertapenem (INVANZ) 1 g in sodium chloride 0.9 % 50 mL IVPB  Status:  Discontinued        1 g 100 mL/hr over 30 Minutes Intravenous Every 24 hours 03/11/12 1835 03/20/12 1550   03/11/12 0809   ertapenem (INVANZ) 1 g in sodium chloride 0.9 % 50 mL IVPB        1 g 100 mL/hr over 30 Minutes Intravenous 60 min pre-op 03/11/12 0809 03/11/12 1100          Assessment/Plan: s/p Procedure(s) (LRB): EMBOLECTOMY (Right) INTRA OPERATIVE ARTERIOGRAM (Right) Total abdominal colectomy and ileostomy Splenic infarct - less pain Eating better - taper off TNA Cont PT, nutrition, wound care Hypercoagulable state-on Lovenox, Coumadin per pharmacy  LOS: 12 days    Rick Edwards E 03/23/2012

## 2012-03-23 NOTE — Progress Notes (Signed)
[facility] [place]  Subjective  - No complaints  Objective 132/85 90 97.5 F (36.4 C) (Oral) 18 100%  Intake/Output Summary (Last 24 hours) at 03/23/12 1223 Last data filed at 03/23/12 4098  Gross per 24 hour  Intake 2736.4 ml  Output   2190 ml  Net  546.4 ml   Incisions healing right leg Easily palpable PT pulse Toes still demarcating at this point  Assessment/Planning: Dr Edilia Bo to reevaluate next week Will probably need TMA at some point but watchful waiting at this point  Rick Edwards,Rick Edwards 03/23/2012 12:23 PM --  Laboratory Lab Results:  Basename 03/22/12 0541 03/21/12 0500  WBC 13.5* 14.0*  HGB 8.9* 8.9*  HCT 26.8* 26.9*  PLT 890* 749*   BMET  Basename 03/21/12 0500  NA 133*  K 3.5  CL 97  CO2 29  GLUCOSE 115*  BUN 8  CREATININE 0.43*  CALCIUM 8.0*    COAG Lab Results  Component Value Date   INR 1.39 03/23/2012   INR 1.25 03/22/2012   INR 1.53* 03/21/2012   No results found for this basename: PTT    Antibiotics Anti-infectives     Start     Dose/Rate Route Frequency Ordered Stop   03/21/12 0000   vancomycin (VANCOCIN) 750 mg in sodium chloride 0.9 % 150 mL IVPB  Status:  Discontinued        750 mg 150 mL/hr over 60 Minutes Intravenous Every 8 hours 03/20/12 1550 03/21/12 0844   03/20/12 1800   levofloxacin (LEVAQUIN) IVPB 750 mg  Status:  Discontinued        750 mg 100 mL/hr over 90 Minutes Intravenous Every 24 hours 03/20/12 1553 03/21/12 0844   03/20/12 1700   ceFEPIme (MAXIPIME) 1 g in dextrose 5 % 50 mL IVPB  Status:  Discontinued        1 g 100 mL/hr over 30 Minutes Intravenous 3 times per day 03/20/12 1553 03/21/12 0844   03/20/12 1600   vancomycin (VANCOCIN) IVPB 1000 mg/200 mL premix  Status:  Discontinued        1,000 mg 200 mL/hr over 60 Minutes Intravenous  Once 03/20/12 1550 03/21/12 0938   03/11/12 1930   ertapenem (INVANZ) 1 g in sodium chloride 0.9 % 50 mL IVPB  Status:  Discontinued        1  g 100 mL/hr over 30 Minutes Intravenous Every 24 hours 03/11/12 1835 03/20/12 1550   03/11/12 0809   ertapenem (INVANZ) 1 g in sodium chloride 0.9 % 50 mL IVPB        1 g 100 mL/hr over 30 Minutes Intravenous 60 min pre-op 03/11/12 0809 03/11/12 1100

## 2012-03-23 NOTE — Progress Notes (Signed)
PARENTERAL NUTRITION CONSULT NOTE - Follow Up  Pharmacy Consult for TNA Indication: Severe pre-op malnutrition and expected post op ileus  Patient Measurements: Height: 5\' 9"  (175.3 cm) Weight: 145 lb 1 oz (65.8 kg) IBW/kg (Calculated) : 70.7  Usual Weight: ~70 kg  Vital Signs: Temp: 97.5 F (36.4 C) (08/17 0553) Temp src: Oral (08/17 0553) BP: 132/85 mmHg (08/17 0553) Pulse Rate: 90  (08/17 0553) Intake/Output from previous day: 08/16 0701 - 08/17 0700 In: 2736.4 [P.O.:600; I.V.:673; VHQ:4696.2] Out: 2465 [Urine:1825; Stool:640] Intake/Output from this shift:    Labs:  Basename 03/23/12 0548 03/22/12 0541 03/21/12 0500  WBC -- 13.5* 14.0*  HGB -- 8.9* 8.9*  HCT -- 26.8* 26.9*  PLT -- 890* 749*  APTT -- -- --  INR 1.39 1.25 1.53*     Basename 03/21/12 0500  NA 133*  K 3.5  CL 97  CO2 29  GLUCOSE 115*  BUN 8  CREATININE 0.43*  LABCREA --  CREAT24HRUR --  CALCIUM 8.0*  MG 1.9  PHOS 4.2  PROT 5.5*  ALBUMIN 1.4*  AST 23  ALT 38  ALKPHOS 256*  BILITOT 0.4  BILIDIR --  IBILI --  PREALBUMIN --  TRIG --  CHOLHDL --  CHOL --   Estimated Creatinine Clearance: 99.4 ml/min (by C-G formula based on Cr of 0.43).   No results found for this basename: GLUCAP:3 in the last 72 hours CBGs & Insulin requirements past 24 hours:  SSI d/c d/t good glucose control  Nutritional Goals: RD recommendation: 1800-2000 kcal/day 80-100 g protein/day Goal is Clinimix E 5/15 at 68ml/hr and IVFE 20% at 10 ml/hr on MWF = 96gm protein (1.5 g/kg/d), avg of 1600 KCal  Current Diet: Clinimix E 5/15 at 60 cc/hr + 20% Lipids at 10 cc/hr on MWF provide an average of 1228 kcal and 72g protein per day Resource Breeze 3x/day between meals (charted as given) On 8/16: __% of breakfast, 75% of lunch, 30% of dinner charted as well as 600 cc oral supplement.  Assessment:  8 YOM with severe Crohn's colitis of the right colon with hx of perforation and right hemicolectomy with anastomosis  10 years ago. Recently developed increased postprandial pain with 20 pound weight loss over the past month due to lack of appetite. He is now s/p partial colectomy with end ileostomy and repair of enterotomy on 03/11/2012. PICC line ordered 8/5; TNA started 8/7 d/t delay in placing PICC d/t need for urgent thrombectomy for ischemic right foot 8/6.  GI: s/p subtotal colectomy w/ end ileostmy and repair of enterotomy 8/5, TNA started for anticipation of post-op ileus; 525 cc stool noted via ostomy bag; changed to regular diet on 8/14. He has tolerated diet somewhat better but not at 100% yet.  He has 100 ml documented as output from illeostomy..   Endo: no h/o DM, CBGs at goal of <150 on 8/12 but SSI d/c 8/12.  BMET yesterday with glucose at 115.  Lytes: No new BMET today, lytes slightly below desired goal ranges.  Supplements giving yesterday.  Will get a BMET on Monday and re-evaluate.  Renal: SCr stable, UOP good, 1 ml/kg/hr  Hepatobil:  Alkphos up to 256 << 139, Tbili down to 0.4 << 1.8 elevated, Tbil high. AST/ALT WNL, prealbumin low at BL- still low, but slightly improved- expect slow progress d/t surgeries and ongoing inflammation. Noted Chol WNL, triglycerides incr slightly.  Pulm: RA  Neuro: GCS 15, Pain 2-10/10, c/o pleuritic chest pain on 8/14, on prn pain  meds   Cards: BP elevated (120-160/70-90), HR elevated (80-100). On no CV meds   ID: Ertapenem d/ced and changed to Cefepime + Levaquin + Vanc on 8/14 evening for empiric HCAP coverage -- now all d/ced by MD this a.m. Tmax/24h: 99.1 WBC 14 << 11.1.No micro data. CXR (8/14): new left lower airspace opacity concerning for PNA  Heme:  Thrombocytosis with platelets to 890K today.  Hx. of anemia noted.  Best Practices: SCDs, MC, full dose lovenox>>Coumadin  Plan:  1. Continue Clinimix E5/15 at 60 ml/hr to encourage ongoing appetite tolerance.  He is drinking all resource supplements between meals however still not yet at 100% of regular  diet. 2. Continue supplementing multivitamin po daily d/t shortage of IV formulation and the patient tolerating some oral intake 3. Provide available trace elements, Lipids at 10 ml/hr MWF only 2/2 national shortage  4. Will continue to follow and monitor diet advancement 5. F/u TPN labs  Nadara Mustard, PharmD., MS Clinical Pharmacist Pager:  [phone] Thank you for allowing pharmacy to be part of this patients care team. 03/23/2012 8:09 AM

## 2012-03-23 NOTE — Progress Notes (Signed)
ANTICOAGULATION CONSULT NOTE - Follow Up Consult  Pharmacy Consult for Lovneox/Warfarin Indication: VTE + Hypercoagulable state (AT III Deficiency)  Assessment: 56 y.o male who was on Lovenox bridge to coumadin for VTE, pt. with AT III deficiency, Anti-Xa level drawn on Lovenox was within goal, obtained ~ 3.5 hrs after dose (ususally 4 hours). Renal function stable. Hgb/Hct/Plt low but stable. No bleeding noted.    Lab Review today:   H/H remains stable but low (8.9/26.8).  He has some thrombocytosis with platelets at 890K.  His INR is down after missed dose on 8/14.  Today INR = 1.39.    Goal of Therapy:  INR 2-3 Anti-Xa level 0.6-1.2 units/ml 4hrs after LMWH dose given Monitor platelets by anticoagulation protocol: Yes   Plan:  1. Continue Lovenox 65 mg SQ every 12 hours 2. Will continue to monitor for any signs/symptoms of bleeding, renal function for dose adjustments with lovenox and PT/INR in the a.m.  3.  Warfarin 7.5mg  x 1 today 4.  F/U AM labs and adjust.  Nadara Mustard, PharmD., MS Clinical Pharmacist Pager:  [phone]  Thank you for allowing pharmacy to be part of this patients care team. 03/21/2012 7:19 PM    Allergies  Allergen Reactions  . Mesalamine     REACTION: Rash   Patient Measurements: Height: 5\' 9"  (175.3 cm) Weight: 145 lb 1 oz (65.8 kg) IBW/kg (Calculated) : 70.7   Vital Signs: Temp: 97.5 F (36.4 C) (08/17 0553) Temp src: Oral (08/17 0553) BP: 132/85 mmHg (08/17 0553) Pulse Rate: 90  (08/17 0553)  Labs:  Alvira Philips 03/23/12 0548 03/22/12 0541 03/21/12 1848 03/21/12 0500  HGB -- 8.9* -- 8.9*  HCT -- 26.8* -- 26.9*  PLT -- 890* -- 749*  APTT -- -- -- --  LABPROT 17.3* 16.0* -- 18.7*  INR 1.39 1.25 -- 1.53*  HEPARINUNFRC -- -- 0.69 --  CREATININE -- -- -- 0.43*  CKTOTAL -- -- -- --  CKMB -- -- -- --  TROPONINI -- -- -- --   Estimated Creatinine Clearance: 99.4 ml/min (by C-G formula based on Cr of 0.43).

## 2012-03-24 LAB — BASIC METABOLIC PANEL
BUN: 6 mg/dL (ref 6–23)
Chloride: 98 mEq/L (ref 96–112)
Glucose, Bld: 97 mg/dL (ref 70–99)
Potassium: 3.5 mEq/L (ref 3.5–5.1)

## 2012-03-24 MED ORDER — WARFARIN SODIUM 7.5 MG PO TABS
7.5000 mg | ORAL_TABLET | Freq: Once | ORAL | Status: AC
  Administered 2012-03-24: 7.5 mg via ORAL
  Filled 2012-03-24: qty 1

## 2012-03-24 NOTE — Progress Notes (Signed)
PARENTERAL NUTRITION CONSULT NOTE - Follow Up  Pharmacy Consult for TNA (Day 1 after discontinuation) Indication: Severe pre-op malnutrition and expected post op ileus  Labs:  [facility] 03/24/12 0515 03/23/12 0548 03/22/12 0541  WBC -- -- 13.5*  HGB -- -- 8.9*  HCT -- -- 26.8*  PLT -- -- 890*  APTT -- -- --  INR 1.53* 1.39 1.25    Basename 03/24/12 0515  NA 133*  K 3.5  CL 98  CO2 29  GLUCOSE 97  BUN 6  CREATININE 0.37*  LABCREA --  CREAT24HRUR --  CALCIUM 8.0*  MG --  PHOS --  PROT --  ALBUMIN --  AST --  ALT --  ALKPHOS --  BILITOT --  BILIDIR --  IBILI --  PREALBUMIN --  TRIG --  CHOLHDL --  CHOL --   CBGs & Insulin requirements past 24 hours:  SSI d/c d/t good glucose control  Nutritional Goals: RD recommendation: 1800-2000 kcal/day 80-100 g protein/day  Current Diet: TNA tapered off without noted complications.  He continues to tolerate oral supplements and diet.  Assessment:  54 YOM with severe Crohn's colitis of the right colon with hx of perforation and right hemicolectomy with anastomosis 10 years ago. Recently developed increased postprandial pain with 20 pound weight loss over the past month due to lack of appetite. He is now s/p partial colectomy with end ileostomy and repair of enterotomy on 03/11/2012. PICC line ordered 8/5; TNA started 8/7 d/t delay in placing PICC d/t need for urgent thrombectomy for ischemic right foot 8/6.  GI: s/p subtotal colectomy w/ end ileostmy and repair of enterotomy 8/5, TNA started for anticipation of post-op ileus; 525 cc stool noted via ostomy bag; changed to regular diet on 8/14. He has tolerated diet somewhat better but not at 100% yet.  He has 100 ml documented as output from illeostomy..   Endo: no h/o DM, CBGs at goal of <150 on 8/12 but SSI d/c 8/12.  BMET yesterday with glucose at 115.  Lytes:  He has some mild hypokalemia, otherwise electrolytes are fine.  Supplements giving yesterday.    Renal: SCr stable,  UOP good, 1 ml/kg/hr  Hepatobil:  Alkphos up to 256 << 139, Tbili down to 0.4 << 1.8 elevated, Tbil high. AST/ALT WNL, prealbumin low at BL- still low, but slightly improved- expect slow progress d/t surgeries and ongoing inflammation. Noted Chol WNL, triglycerides incr slightly.  Pulm: RA  Neuro: GCS 15, Pain 2-10/10, c/o pleuritic chest pain on 8/14, on prn pain meds   Cards: BP elevated (120-160/70-90), HR elevated (80-100). On no CV meds   ID: Ertapenem d/ced and changed to Cefepime + Levaquin + Vanc on 8/14 evening for empiric HCAP coverage -- now all d/ced by MD this a.m. Tmax/24h: 99.1 WBC 14 << 11.1.No micro data. CXR (8/14): new left lower airspace opacity concerning for PNA  Heme:  Thrombocytosis with platelets to 890K today.  Hx. of anemia noted.  Best Practices: SCDs, MC, full dose lovenox>>Coumadin  Plan:   - Pharmacy will sign off with regards to his nutritional support.  Thank you for allowing pharmacy to be part of this patients care team.   Nadara Mustard, PharmD., MS Clinical Pharmacist Pager:  [phone] 03/24/2012 8:47 AM

## 2012-03-24 NOTE — Progress Notes (Signed)
ANTICOAGULATION CONSULT NOTE - Follow Up Consult  Pharmacy Consult for Lovneox/Warfarin Indication: VTE + Hypercoagulable state (AT III Deficiency)  Assessment: 56 y.o male who was on Lovenox bridge to coumadin for VTE, pt. with AT III deficiency, Anti-Xa level drawn on Lovenox was within goal, obtained ~ 3.5 hrs after dose (ususally 4 hours). Renal function stable. Hgb/Hct/Plt low but stable. No bleeding noted.    Lab Review today:   H/H remains stable but low (8.9/26.8)-No repeat CBC today.  He has some thrombocytosis with platelets at 890K.  His INR is down after missed dose on 8/14.  Today INR = 1.53  Goal of Therapy:  INR 2-3 Anti-Xa level 0.6-1.2 units/ml 4hrs after LMWH dose given Monitor platelets by anticoagulation protocol: Yes   Plan:  1. Continue Lovenox 65 mg SQ every 12 hours 2. Will continue to monitor for any signs/symptoms of bleeding, renal function for dose adjustments with lovenox and PT/INR in the a.m.  3.  Warfarin 7.5mg  x 1 today 4.  F/U AM labs and adjust.  Link Snuffer, PharmD, BCPS Clinical Pharmacist [phone] 03/24/2012, 10:06 AM  Thank you for allowing pharmacy to be part of this patients care team. 03/21/2012 7:19 PM    Allergies  Allergen Reactions  . Mesalamine     REACTION: Rash   Patient Measurements: Height: 5\' 9"  (175.3 cm) Weight: 145 lb 1 oz (65.8 kg) IBW/kg (Calculated) : 70.7   Vital Signs: Temp: 98.2 F (36.8 C) (08/18 0628) BP: 116/77 mmHg (08/18 0628) Pulse Rate: 89  (08/18 0628)  Labs:  Basename 03/24/12 0515 03/23/12 0548 03/22/12 0541 03/21/12 1848  HGB -- -- 8.9* --  HCT -- -- 26.8* --  PLT -- -- 890* --  APTT -- -- -- --  LABPROT 18.7* 17.3* 16.0* --  INR 1.53* 1.39 1.25 --  HEPARINUNFRC -- -- -- 0.69  CREATININE 0.37* -- -- --  CKTOTAL -- -- -- --  CKMB -- -- -- --  TROPONINI -- -- -- --   Estimated Creatinine Clearance: 99.4 ml/min (by C-G formula based on Cr of 0.37).

## 2012-03-24 NOTE — Progress Notes (Signed)
12 Days Post-Op  Subjective: No complaints except right foot discomfort (not new)  Objective: Vital signs in last 24 hours: Temp:  [98 F (36.7 C)-98.8 F (37.1 C)] 98.2 F (36.8 C) (08/18 0628) Pulse Rate:  [89-97] 89  (08/18 0628) Resp:  [18] 18  (08/18 0628) BP: (116-128)/(72-77) 116/77 mmHg (08/18 0628) SpO2:  [99 %-100 %] 99 % (08/18 0628) Last BM Date: 03/23/12  Intake/Output from previous day: 08/17 0701 - 08/18 0700 In: 1814.2 [P.O.:1140; I.V.:674.2] Out: 2475 [Urine:1100; Stool:1375] Intake/Output this shift:    Abdomen soft, incision stable Ostomy pink, productive  Lab Results:   [facility] 03/22/12 0541  WBC 13.5*  HGB 8.9*  HCT 26.8*  PLT 890*   BMET  Basename 03/24/12 0515  NA 133*  K 3.5  CL 98  CO2 29  GLUCOSE 97  BUN 6  CREATININE 0.37*  CALCIUM 8.0*   PT/INR  Basename 03/24/12 0515 03/23/12 0548  LABPROT 18.7* 17.3*  INR 1.53* 1.39   ABG No results found for this basename: PHART:2,PCO2:2,PO2:2,HCO3:2 in the last 72 hours  Studies/Results: No results found.  Anti-infectives: Anti-infectives     Start     Dose/Rate Route Frequency Ordered Stop   03/21/12 0000   vancomycin (VANCOCIN) 750 mg in sodium chloride 0.9 % 150 mL IVPB  Status:  Discontinued        750 mg 150 mL/hr over 60 Minutes Intravenous Every 8 hours 03/20/12 1550 03/21/12 0844   03/20/12 1800   levofloxacin (LEVAQUIN) IVPB 750 mg  Status:  Discontinued        750 mg 100 mL/hr over 90 Minutes Intravenous Every 24 hours 03/20/12 1553 03/21/12 0844   03/20/12 1700   ceFEPIme (MAXIPIME) 1 g in dextrose 5 % 50 mL IVPB  Status:  Discontinued        1 g 100 mL/hr over 30 Minutes Intravenous 3 times per day 03/20/12 1553 03/21/12 0844   03/20/12 1600   vancomycin (VANCOCIN) IVPB 1000 mg/200 mL premix  Status:  Discontinued        1,000 mg 200 mL/hr over 60 Minutes Intravenous  Once 03/20/12 1550 03/21/12 0938   03/11/12 1930   ertapenem (INVANZ) 1 g in sodium chloride 0.9  % 50 mL IVPB  Status:  Discontinued        1 g 100 mL/hr over 30 Minutes Intravenous Every 24 hours 03/11/12 1835 03/20/12 1550   03/11/12 0809   ertapenem (INVANZ) 1 g in sodium chloride 0.9 % 50 mL IVPB        1 g 100 mL/hr over 30 Minutes Intravenous 60 min pre-op 03/11/12 0809 03/11/12 1100          Assessment/Plan: s/p Procedure(s) (LRB): EMBOLECTOMY (Right) INTRA OPERATIVE ARTERIOGRAM (Right)    Electrolytes stable Pain controlled Continuing antibiotics, anticoagulation  LOS: 13 days    Rick Edwards A 03/24/2012

## 2012-03-25 MED ORDER — OXYCODONE-ACETAMINOPHEN 5-325 MG PO TABS
1.0000 | ORAL_TABLET | ORAL | Status: DC | PRN
  Administered 2012-03-25 – 2012-03-28 (×7): 2 via ORAL
  Filled 2012-03-25 (×7): qty 2

## 2012-03-25 MED ORDER — WARFARIN SODIUM 7.5 MG PO TABS
7.5000 mg | ORAL_TABLET | Freq: Once | ORAL | Status: AC
  Administered 2012-03-25: 7.5 mg via ORAL
  Filled 2012-03-25: qty 1

## 2012-03-25 NOTE — Progress Notes (Signed)
ANTICOAGULATION CONSULT NOTE - Follow Up Consult  Pharmacy Consult for Lovneox/Warfarin Indication: VTE + Hypercoagulable state (AT III Deficiency)  Assessment: 56 y.o male who was on Lovenox bridge to coumadin for VTE, pt. with AT III deficiency, Anti-Xa level drawn on Lovenox was within goal, obtained ~ 3.5 hrs after dose (ususally 4 hours). Renal function stable. Hgb/Hct/Plt low but stable. No bleeding noted.    Lab Review today:   H/H remains stable but low (8.9/26.8)-No repeat CBC today.  He has some thrombocytosis with platelets at 890K.  His INR is down after missed dose on 8/14.  Today INR =1.9 and trend up nicely.  Goal of Therapy:  INR 2-3 Anti-Xa level 0.6-1.2 units/ml 4hrs after LMWH dose given Monitor platelets by anticoagulation protocol: Yes   Plan:  1. Continue Lovenox 65 mg SQ every 12 hours 2. Will continue to monitor for any signs/symptoms of bleeding, renal function for dose adjustments with lovenox and PT/INR in the a.m.  3.  Warfarin 7.5mg  x 1 today 4.  F/U AM labs and adjust.  Verlene Mayer, PharmD, BCPS Pager [phone] 03/25/2012, 12:56 PM    Allergies  Allergen Reactions  . Mesalamine     REACTION: Rash   Patient Measurements: Height: 5\' 9"  (175.3 cm) Weight: 145 lb 1 oz (65.8 kg) IBW/kg (Calculated) : 70.7   Vital Signs: Temp: 99.9 F (37.7 C) (08/19 0538) Temp src: Oral (08/19 0538) BP: 140/85 mmHg (08/19 0538) Pulse Rate: 98  (08/19 0538)  Labs:  Basename 03/25/12 0600 03/24/12 0515 03/23/12 0548  HGB -- -- --  HCT -- -- --  PLT -- -- --  APTT -- -- --  LABPROT 22.1* 18.7* 17.3*  INR 1.90* 1.53* 1.39  HEPARINUNFRC -- -- --  CREATININE -- 0.37* --  CKTOTAL -- -- --  CKMB -- -- --  TROPONINI -- -- --   Estimated Creatinine Clearance: 99.4 ml/min (by C-G formula based on Cr of 0.37).

## 2012-03-25 NOTE — Consult Note (Signed)
Ostomy follow-up:  Pouch leaking R/T very full with 300cc light green liquid.  Stoma red and viable, above skin level, one piece pouch applied.  Pt assisted and asked appropriate questions.  Discussed dietary precautions, ordering supplies, and pouching routines.  Pt able to empty and reapply velcro closure. Supplies ordered to bedside for staff use.  Cammie Mcgee, RN, MSN, [employer]  [phone]

## 2012-03-25 NOTE — Progress Notes (Signed)
Patient ID: Rick Edwards, male   DOB: [birth year], 56 y.o.   MRN: [id number] 13 Days Post-Op  Subjective: Still some intermittant pain LUQ and right toes.  Eating well, no abd pain or nausea with eating  Objective: Vital signs in last 24 hours: Temp:  [98.7 F (37.1 C)-99.9 F (37.7 C)] 99.9 F (37.7 C) (08/19 0538) Pulse Rate:  [98-101] 98  (08/19 0538) Resp:  [18] 18  (08/19 0538) BP: (130-140)/(77-86) 140/85 mmHg (08/19 0538) SpO2:  [100 %] 100 % (08/19 0538) Last BM Date: 03/25/12  Intake/Output from previous day: 08/18 0701 - 08/19 0700 In: 1188 [I.V.:1188] Out: 2625 [Urine:700; Stool:1925] Intake/Output this shift: Total I/O In: -  Out: 450 [Urine:450]  General appearance: alert, cooperative and no distress GI: normal findings: soft, non-tender Extremities: Distal toes ischemic, tender, no change Incision/Wound: Dressed, clean per nursing  Lab Results:  No results found for this basename: WBC:2,HGB:2,HCT:2,PLT:2 in the last 72 hours BMET  [facility] 03/24/12 0515  NA 133*  K 3.5  CL 98  CO2 29  GLUCOSE 97  BUN 6  CREATININE 0.37*  CALCIUM 8.0*   Lab Results  Component Value Date   INR 1.90* 03/25/2012   INR 1.53* 03/24/2012   INR 1.39 03/23/2012     Studies/Results: No results found.  Anti-infectives: Anti-infectives     Start     Dose/Rate Route Frequency Ordered Stop   03/21/12 0000   vancomycin (VANCOCIN) 750 mg in sodium chloride 0.9 % 150 mL IVPB  Status:  Discontinued        750 mg 150 mL/hr over 60 Minutes Intravenous Every 8 hours 03/20/12 1550 03/21/12 0844   03/20/12 1800   levofloxacin (LEVAQUIN) IVPB 750 mg  Status:  Discontinued        750 mg 100 mL/hr over 90 Minutes Intravenous Every 24 hours 03/20/12 1553 03/21/12 0844   03/20/12 1700   ceFEPIme (MAXIPIME) 1 g in dextrose 5 % 50 mL IVPB  Status:  Discontinued        1 g 100 mL/hr over 30 Minutes Intravenous 3 times per day 03/20/12 1553 03/21/12 0844   03/20/12 1600    vancomycin (VANCOCIN) IVPB 1000 mg/200 mL premix  Status:  Discontinued        1,000 mg 200 mL/hr over 60 Minutes Intravenous  Once 03/20/12 1550 03/21/12 0938   03/11/12 1930   ertapenem (INVANZ) 1 g in sodium chloride 0.9 % 50 mL IVPB  Status:  Discontinued        1 g 100 mL/hr over 30 Minutes Intravenous Every 24 hours 03/11/12 1835 03/20/12 1550   03/11/12 0809   ertapenem (INVANZ) 1 g in sodium chloride 0.9 % 50 mL IVPB        1 g 100 mL/hr over 30 Minutes Intravenous 60 min pre-op 03/11/12 0809 03/11/12 1100          Assessment/Plan: s/p Procedure(s): EMBOLECTOMY INTRA OPERATIVE ARTERIOGRAM Total abdominal colectomy with ileostomy Improving. Continue PT.  Prob discharge this week pending vascular surgery plans, home vs rehab? No apparent Gi, abdominal issues Change to oral pain meds   LOS: 14 days    Cory Rama T 03/25/2012

## 2012-03-25 NOTE — Progress Notes (Signed)
Clinical Social Work  CSW met with patient and mom at bedside. CSW introduced myself and explained role. CSW reviewed chart which stated PT recommended HH vs SNF depending on progress. Patient previously declined SNF. CSW explained the difference between [facility] and SNF again for family. Patient reports that with support of family he will be safe going home. CSW is signing off but available if needed.  [place], [place] [phone]

## 2012-03-25 NOTE — Progress Notes (Signed)
[facility] [place]  Progress Note Bypass Surgery  Date of Surgery: 03/11/2012 - 03/12/2012  Procedure(s): EMBOLECTOMY INTRA OPERATIVE ARTERIOGRAM Surgeon: Surgeon(s): Chuck Hint, MD  13 Days Post-Op  History of Present Illness  Rick Edwards is a 56 y.o. male who is S/P Procedure(s): EMBOLECTOMY INTRA OPERATIVE ARTERIOGRAM right.  The patient's pre-op symptoms of pain are Unchanged . Patients pain is somewhat  well controlled.      Imaging: No results found.  Significant Diagnostic Studies: CBC Lab Results  Component Value Date   WBC 13.5* 03/22/2012   HGB 8.9* 03/22/2012   HCT 26.8* 03/22/2012   MCV 90.2 03/22/2012   PLT 890* 03/22/2012    BMET     Component Value Date/Time   NA 133* 03/24/2012 0515   K 3.5 03/24/2012 0515   CL 98 03/24/2012 0515   CO2 29 03/24/2012 0515   GLUCOSE 97 03/24/2012 0515   GLUCOSE 101 02/26/2008 1535   BUN 6 03/24/2012 0515   CREATININE 0.37* 03/24/2012 0515   CALCIUM 8.0* 03/24/2012 0515   GFRNONAA >90 03/24/2012 0515   GFRAA >90 03/24/2012 0515    COAG Lab Results  Component Value Date   INR 1.90* 03/25/2012   INR 1.53* 03/24/2012   INR 1.39 03/23/2012   No results found for this basename: PTT    Physical Examination  BP Readings from Last 3 Encounters:  03/25/12 140/85  03/25/12 140/85  03/25/12 140/85   Temp Readings from Last 3 Encounters:  03/25/12 99.9 F (37.7 C) Oral  03/25/12 99.9 F (37.7 C) Oral  03/25/12 99.9 F (37.7 C) Oral   SpO2 Readings from Last 3 Encounters:  03/25/12 100%  03/25/12 100%  03/25/12 100%   Pulse Readings from Last 3 Encounters:  03/25/12 98  03/25/12 98  03/25/12 98    Pt is A&O x 3 right lower extremity: Incision/s is/are clean,dry.intact, and  healing without hematoma, erythema or drainage Limb is warm; with good color Toes are dark and cooler than the forefoot  Right Dorsalis Pedis pulse is biphasic by Doppler RightPosterior tibial pulse is   biphasic by Doppler and palpable     Assessment/Plan: Pt. Doing well Post-op pain is somewhat controlled as long as he doesn't move his foot too much.  He does have foot/toe pain at rest. PT/OT for ambulation   Clinton Gallant [facility] [phone] 03/25/2012 9:36 AM

## 2012-03-25 NOTE — Progress Notes (Signed)
UR completed 

## 2012-03-25 NOTE — Progress Notes (Signed)
Nutrition Follow-up  Intervention:   Continue current management.  Pt continues to improve PO intake with d/c of TPN.  Continue supplements.  Assessment:   Pt has weaned from TPN and is maintaining PO diet well.  Pt improved intake at breakfast today to 100% of meal and has consistently been consuming [employer] supplements TID for additional 1050 kcal, 39g protein.   Per MD note, no GI distress.  Diet Order:  Regular  Meds: Scheduled Meds:   . enoxaparin (LOVENOX) injection  65 mg Subcutaneous Q12H  . feeding supplement  1 Container Oral TID BM  . fentaNYL  25 mcg Transdermal Q72H  . multivitamin with minerals  1 tablet Oral Daily  . sodium chloride  10-40 mL Intracatheter Q12H  . warfarin  7.5 mg Oral ONCE-1800  . warfarin  7.5 mg Oral ONCE-1800  . Warfarin - Pharmacist Dosing Inpatient   Does not apply q1800  . DISCONTD: antiseptic oral rinse  15 mL Mouth Rinse q12n4p   Continuous Infusions:   . dextrose 5 % and 0.9% NaCl 20 mL/hr at 03/25/12 1043   PRN Meds:.HYDROmorphone (DILAUDID) injection, ondansetron (ZOFRAN) IV, ondansetron, oxyCODONE-acetaminophen, sodium chloride  Labs:  CMP     Component Value Date/Time   NA 133* 03/24/2012 0515   K 3.5 03/24/2012 0515   CL 98 03/24/2012 0515   CO2 29 03/24/2012 0515   GLUCOSE 97 03/24/2012 0515   GLUCOSE 101 02/26/2008 1535   BUN 6 03/24/2012 0515   CREATININE 0.37* 03/24/2012 0515   CALCIUM 8.0* 03/24/2012 0515   PROT 5.5* 03/21/2012 0500   ALBUMIN 1.4* 03/21/2012 0500   AST 23 03/21/2012 0500   ALT 38 03/21/2012 0500   ALKPHOS 256* 03/21/2012 0500   BILITOT 0.4 03/21/2012 0500   GFRNONAA >90 03/24/2012 0515   GFRAA >90 03/24/2012 0515     Intake/Output Summary (Last 24 hours) at 03/25/12 1550 Last data filed at 03/25/12 1400  Gross per 24 hour  Intake   1993 ml  Output   2550 ml  Net   -557 ml    Weight Status:  No new wt Last known wt: 145 lbs (8/6)  Re-estimated needs:  1800-2000 kcal, 80-100g protein  Nutrition  Dx:  Inadequate oral intake, improving  Monitor:   1. Food/Beverage; improvement in intake to >50% of meals. Met, pt has improved intake. Continue with supplements BID. Met, pt consuming TID 2. Wt/wt change; monitor trends.  Not met, no new wt 3. Nutrition-related labs; while on TPN.  No longer appropriate.  Discontinue.    Loyce Dys, MS RD LDN Clinical Inpatient Dietitian Pager: [phone] Weekend/After hours pager: [phone]

## 2012-03-25 NOTE — Progress Notes (Signed)
UR complete 

## 2012-03-26 LAB — CBC
Hemoglobin: 8.2 g/dL — ABNORMAL LOW (ref 13.0–17.0)
MCHC: 32.7 g/dL (ref 30.0–36.0)
Platelets: 1044 10*3/uL (ref 150–400)
RBC: 2.83 MIL/uL — ABNORMAL LOW (ref 4.22–5.81)

## 2012-03-26 LAB — PROTIME-INR
INR: 2.46 — ABNORMAL HIGH (ref 0.00–1.49)
Prothrombin Time: 27.1 seconds — ABNORMAL HIGH (ref 11.6–15.2)

## 2012-03-26 LAB — PATHOLOGIST SMEAR REVIEW

## 2012-03-26 MED ORDER — WARFARIN SODIUM 5 MG PO TABS
5.0000 mg | ORAL_TABLET | Freq: Once | ORAL | Status: AC
  Administered 2012-03-26: 5 mg via ORAL
  Filled 2012-03-26: qty 1

## 2012-03-26 NOTE — Progress Notes (Signed)
CRITICAL VALUE ALERT  Critical value received:  Platelet 1044  Date of notification:  03/26/12  Time of notification:  0745  Critical value read back:yes  Nurse who received alert:Beulah RN  MD notified (1st page):  [place]  Time of first page:  0745  MD notified (2nd page):  Time of second page:  Responding MD: [place]  Time MD responded: [phone]

## 2012-03-26 NOTE — Progress Notes (Signed)
Patient ID: Rick Edwards, male   DOB: [birth year], 56 y.o.   MRN: [id number] 14 Days Post-Op  Subjective: Foot pain is major C/O, not well controlled on oral pain meds.  Eating well.  Objective: Vital signs in last 24 hours: Temp:  [98.1 F (36.7 C)-98.6 F (37 C)] 98.4 F (36.9 C) (08/20 0536) Pulse Rate:  [88-98] 88  (08/20 0536) Resp:  [18-20] 18  (08/20 0536) BP: (118-139)/(65-80) 118/65 mmHg (08/20 0536) SpO2:  [98 %-100 %] 99 % (08/20 0536) Last BM Date: 03/25/12  Intake/Output from previous day: 08/19 0701 - 08/20 0700 In: 1045 [P.O.:720; I.V.:325] Out: 1075 [Urine:450; Stool:625] Intake/Output this shift: Total I/O In: 360 [P.O.:360] Out: -   General appearance: alert and no distress GI: normal findings: soft, non-tender and stoma OK Extremities: Ischemic changes of toes right foot Incision/Wound: Dressed, clean and dry  Lab Results:   Basename 03/26/12 0500  WBC 9.7  HGB 8.2*  HCT 25.1*  PLT 1044*   BMET  Basename 03/24/12 0515  NA 133*  K 3.5  CL 98  CO2 29  GLUCOSE 97  BUN 6  CREATININE 0.37*  CALCIUM 8.0*   Lab Results  Component Value Date   INR 2.46* 03/26/2012   INR 1.90* 03/25/2012   INR 1.53* 03/24/2012     Studies/Results: No results found.  Anti-infectives:   Assessment/Plan: s/p Procedure(s): EMBOLECTOMY INTRA OPERATIVE ARTERIOGRAM Abdominal colectomy and ileostomy  Pt ready for discharge from abdominal surgery but pain control for his foot remains an issue I need direction from vascular surgery regarding plans for possible amputation this admission or discharge and F/U (though pain control at home will be difficult) Thrombocytopenia likely secondary to splenic infarct, pt anticoagulated   LOS: 15 days    Rick Edwards T 03/26/2012

## 2012-03-26 NOTE — Progress Notes (Signed)
VASCULAR PROGRESS NOTE  SUBJECTIVE: Still with some right foot pain.  PHYSICAL EXAM: Filed Vitals:   03/25/12 2045 03/25/12 2107 03/26/12 0536 03/26/12 1436  BP: 136/80 139/80 118/65 142/83  Pulse:  98 88 109  Temp:  98.1 F (36.7 C) 98.4 F (36.9 C) 98.4 F (36.9 C)  TempSrc:  Oral Oral Oral  Resp: 18 18 18 20   Height:      Weight:      SpO2: 100% 98% 99% 98%   Right first through fourth toes are demarcating. Foot is warm up until the forefoot.  LABS: Lab Results  Component Value Date   WBC 9.7 03/26/2012   HGB 8.2* 03/26/2012   HCT 25.1* 03/26/2012   MCV 88.7 03/26/2012   PLT 1044* 03/26/2012   Lab Results  Component Value Date   CREATININE 0.37* 03/24/2012   Lab Results  Component Value Date   INR 2.46* 03/26/2012   CBG (last 3)  No results found for this basename: GLUCAP:3 in the last 72 hours   ASSESSMENT/PLAN: 1. Okay for discharge from a vascular standpoint. I will arrange follow up as an outpatient to follow the right foot wound. He may ultimately require a transmetatarsal amputation. However, will allow the foot to further demarcate which will help salvage more of the foot.  Waverly Ferrari, MD, FACS Beeper: [phone] 03/26/2012

## 2012-03-26 NOTE — Progress Notes (Addendum)
Replace oxisensor at 2030, O2 stat 98-100%, and remove oxygen. Pt was able to maintain O2 stat without oxygen during shift. Pt's pain controlled by prn med (see mar).Old dressing removed, no noted drainage. W/D Dressing applied this am with assistance from patient.Dressing is noted with date, time, and initials. Wound looks healthy no noted drainage or odor.  Pain range 4-7. Ileostomy characteristics soft to loose; medium, loose 100cc. Heel have pillow under.

## 2012-03-26 NOTE — Progress Notes (Signed)
ANTICOAGULATION CONSULT NOTE - Follow Up Consult  Pharmacy Consult:  Lovenox / Coumadin Indication:  VTE + Hypercoagulable state (AT III Deficiency)  Allergies  Allergen Reactions  . Mesalamine     REACTION: Rash    Patient Measurements: Height: 5\' 9"  (175.3 cm) Weight: 145 lb 1 oz (65.8 kg) IBW/kg (Calculated) : 70.7   Vital Signs: Temp: 98.4 F (36.9 C) (08/20 0536) Temp src: Oral (08/20 0536) BP: 118/65 mmHg (08/20 0536) Pulse Rate: 88  (08/20 0536)  Labs:  Basename 03/26/12 0500 03/25/12 0600 03/24/12 0515  HGB 8.2* -- --  HCT 25.1* -- --  PLT 1044* -- --  APTT -- -- --  LABPROT 27.1* 22.1* 18.7*  INR 2.46* 1.90* 1.53*  HEPARINUNFRC -- -- --  CREATININE -- -- 0.37*  CKTOTAL -- -- --  CKMB -- -- --  TROPONINI -- -- --    Estimated Creatinine Clearance: 99.4 ml/min (by C-G formula based on Cr of 0.37).     Assessment: 56 yo male with history of severe Crohn's colitis s/p partial colectomy at [facility] 8/5 then tranferred to [facility] for urgent thrombectomy 8/6 for ischemic right foot. Now s/p right tibial embolectomy with course of clot unclear.  Pharmacy consulted to manage patient's Lovenox bridge to Coumadin therapy for hypercoagulable state.  INR therapeutic today, no bleeding reported.  Patient's renal function remains appropriate for Lovenox dosing.   Goal of Therapy:  INR 2-3 Monitor platelets by anticoagulation protocol: Yes    Plan:  - Coumadin 5mg  PO today - Continue Lovenox today; consider d/c tomorrow if INR remains therapeutic - Daily PT / INR      Glady Ouderkirk D. Laney Potash, PharmD, BCPS Pager:  [phone] 03/26/2012, 1:29 PM

## 2012-03-26 NOTE — Progress Notes (Signed)
Physical Therapy Treatment Patient Details Name: Rick Edwards MRN: [id number] DOB: 1956/03/06 Today's Date: 03/26/2012 Time: 1005-1019 PT Time Calculation (min): 14 min  PT Assessment / Plan / Recommendation Comments on Treatment Session  Patient limited again this session due to pain. Patient agreeable to attempt to get to chair and change positioning. Will continue to progress ambulation and mobility as patient can tolerate    Follow Up Recommendations  Home health PT;Other (comment) (HH vs SNF pending progress)    Barriers to Discharge        Equipment Recommendations  Rolling walker with 5" wheels    Recommendations for Other Services    Frequency Min 4X/week   Plan Discharge plan remains appropriate;Frequency remains appropriate    Precautions / Restrictions Precautions Precautions: Fall   Pertinent Vitals/Pain Patient with extreme R foot pain, limiting mobility    Mobility  Bed Mobility Supine to Sit: 6: Modified independent (Device/Increase time) Sitting - Scoot to Edge of Bed: 6: Modified independent (Device/Increase time) Transfers Sit to Stand: 4: Min assist;With upper extremity assist;From bed Stand to Sit: With upper extremity assist;4: Min assist;With armrests;To chair/3-in-1 Details for Transfer Assistance: A for balance as patient with increased pain in foot. Cues for safe technique and not to pull up on RW Ambulation/Gait Ambulation/Gait Assistance: 3: Mod assist Ambulation Distance (Feet): 5 Feet Assistive device: Rolling walker Ambulation/Gait Assistance Details: Patient hopped on LLE due to inability to bear weight through RLE. Patient with short hop to recliner Gait Pattern: Step-to pattern;Trunk flexed;Antalgic    Exercises     PT Diagnosis:    PT Problem List:   PT Treatment Interventions:     PT Goals Acute Rehab PT Goals PT Goal: Sit to Supine/Side - Progress: Progressing toward goal PT Goal: Sit to Stand - Progress: Progressing toward  goal PT Goal: Ambulate - Progress: Progressing toward goal  Visit Information  Last PT Received On: 03/26/12 Assistance Needed: +1    Subjective Data  Subjective: My foot hurts so bad   Cognition  Overall Cognitive Status: Appears within functional limits for tasks assessed/performed Arousal/Alertness: Awake/alert Orientation Level: Appears intact for tasks assessed Behavior During Session: [facility] for tasks performed    Balance     End of Session PT - End of Session Equipment Utilized During Treatment: Gait belt Activity Tolerance: Patient tolerated treatment well;Patient limited by pain Patient left: in chair;with call bell/phone within reach Nurse Communication: Mobility status   GP     Fredrich Birks 03/26/2012, 2:48 PM 03/26/2012 Fredrich Birks PTA [phone] pager [phone] office

## 2012-03-27 ENCOUNTER — Telehealth: Payer: Self-pay | Admitting: Vascular Surgery

## 2012-03-27 LAB — PROTIME-INR: Prothrombin Time: 26 seconds — ABNORMAL HIGH (ref 11.6–15.2)

## 2012-03-27 MED ORDER — WARFARIN SODIUM 5 MG PO TABS
5.0000 mg | ORAL_TABLET | Freq: Once | ORAL | Status: AC
  Administered 2012-03-27: 5 mg via ORAL
  Filled 2012-03-27: qty 1

## 2012-03-27 MED ORDER — FENTANYL 50 MCG/HR TD PT72
50.0000 ug | MEDICATED_PATCH | TRANSDERMAL | Status: DC
  Administered 2012-03-28: 50 ug via TRANSDERMAL
  Filled 2012-03-27: qty 1

## 2012-03-27 NOTE — Progress Notes (Signed)
Physical Therapy Treatment Patient Details Name: Rick Edwards MRN: [id number] DOB: 1956/04/13 Today's Date: 03/27/2012 Time: 8657-8469 PT Time Calculation (min): 23 min  PT Assessment / Plan / Recommendation Comments on Treatment Session  Patient able to progress today with ambulation and practice stair training. Noted possible plan for DC tomorrow. Patient is planning on going to his parents house. Will need to practice stair again prior to DC home.     Follow Up Recommendations  Home health PT    Barriers to Discharge        Equipment Recommendations  Rolling walker with 5" wheels    Recommendations for Other Services    Frequency Min 4X/week   Plan Discharge plan remains appropriate;Frequency remains appropriate    Precautions / Restrictions Precautions Precautions: Fall   Pertinent Vitals/Pain     Mobility  Bed Mobility Supine to Sit: 6: Modified independent (Device/Increase time) Sitting - Scoot to Edge of Bed: 6: Modified independent (Device/Increase time) Transfers Sit to Stand: 4: Min guard;With upper extremity assist;From bed;With armrests;From chair/3-in-1 Stand to Sit: With armrests;With upper extremity assist;To chair/3-in-1 Details for Transfer Assistance: Cues for safe technique and hand placement Ambulation/Gait Ambulation/Gait Assistance: 4: Min assist Ambulation Distance (Feet): 45 Feet Assistive device: Rolling walker Ambulation/Gait Assistance Details: Cues for posture. Patient able to put weight through R heel. Cues for gait sequence. A for safety and balance Gait Pattern: Step-to pattern;Trunk flexed;Antalgic Stairs: Yes Stairs Assistance: 1: +2 Total assist Stairs Assistance Details (indicate cue type and reason): +2 for safety on steps as patients first time practicing. Cues for technique. A for RW Stair Management Technique: Step to pattern;Backwards;No rails;With walker Number of Stairs: 3  (practiced twice)    Exercises     PT Diagnosis:     PT Problem List:   PT Treatment Interventions:     PT Goals Acute Rehab PT Goals PT Goal: Sit to Supine/Side - Progress: Met PT Goal: Ambulate - Progress: Progressing toward goal PT Goal: Up/Down Stairs - Progress: Progressing toward goal  Visit Information  Last PT Received On: 03/27/12 Assistance Needed: +1    Subjective Data      Cognition  Overall Cognitive Status: Appears within functional limits for tasks assessed/performed Arousal/Alertness: Awake/alert Orientation Level: Appears intact for tasks assessed Behavior During Session: [facility] for tasks performed    Balance     End of Session PT - End of Session Equipment Utilized During Treatment: Gait belt Activity Tolerance: Patient tolerated treatment well Patient left: in chair;with call bell/phone within reach Nurse Communication: Mobility status   GP     Fredrich Birks 03/27/2012, 12:03 PM 03/27/2012 Fredrich Birks PTA [phone] pager [phone] office

## 2012-03-27 NOTE — Progress Notes (Signed)
VASCULAR PROGRESS NOTE  SUBJECTIVE: No specific complaints.  PHYSICAL EXAM: Filed Vitals:   03/26/12 0536 03/26/12 1436 03/26/12 2147 03/27/12 0613  BP: 118/65 142/83 142/81 144/89  Pulse: 88 109 94 99  Temp: 98.4 F (36.9 C) 98.4 F (36.9 C) 98.4 F (36.9 C) 98.9 F (37.2 C)  TempSrc: Oral Oral Oral Oral  Resp: 18 20 20 20   Height:      Weight:      SpO2: 99% 98% 96% 98%   Toes on right foot demarcating. The foot is otherwise warm. Palpable right popliteal pulse.  LABS: Lab Results  Component Value Date   WBC 9.7 03/26/2012   HGB 8.2* 03/26/2012   HCT 25.1* 03/26/2012   MCV 88.7 03/26/2012   PLT 1044* 03/26/2012   Lab Results  Component Value Date   CREATININE 0.37* 03/24/2012   Lab Results  Component Value Date   INR 2.34* 03/27/2012   CBG (last 3)  No results found for this basename: GLUCAP:3 in the last 72 hours   ASSESSMENT/PLAN: 1. 15 Days Post-Op s/p: popliteal and tibial embolectomy with vein patch angioplasty. 2. Anti-thrombin 3 deficiency. Patient is on Coumadin. 3. Okay for discharge from vascular surgery standpoint. I will follow him as an outpatient. The longer we allow the toes to demarcate, the more foot that we should be able to salvage. He remains afebrile with a normal white blood cell count.  Waverly Ferrari, MD, FACS Beeper: [phone] 03/27/2012

## 2012-03-27 NOTE — Progress Notes (Addendum)
[facility] [place]  Progress Note Bypass Surgery  Date of Surgery: 03/11/2012 - 03/12/2012  Procedure(s): EMBOLECTOMY INTRA OPERATIVE ARTERIOGRAM Surgeon: Surgeon(s): Chuck Hint, MD  15 Days Post-Op  History of Present Illness  Rick Edwards is a 56 y.o. male who is S/P Procedure(s): EMBOLECTOMY INTRA OPERATIVE ARTERIOGRAM right.  The patient's pre-op symptoms of pain are Improved . Patients pain is better controlled.    VASC. LAB Studies:       Index above 1.0 Summary: ABIs and Doppler waveforms are within normal limits bilaterally at rest.     Imaging: No results found.  Significant Diagnostic Studies: CBC Lab Results  Component Value Date   WBC 9.7 03/26/2012   HGB 8.2* 03/26/2012   HCT 25.1* 03/26/2012   MCV 88.7 03/26/2012   PLT 1044* 03/26/2012    BMET     Component Value Date/Time   NA 133* 03/24/2012 0515   K 3.5 03/24/2012 0515   CL 98 03/24/2012 0515   CO2 29 03/24/2012 0515   GLUCOSE 97 03/24/2012 0515   GLUCOSE 101 02/26/2008 1535   BUN 6 03/24/2012 0515   CREATININE 0.37* 03/24/2012 0515   CALCIUM 8.0* 03/24/2012 0515   GFRNONAA >90 03/24/2012 0515   GFRAA >90 03/24/2012 0515    COAG Lab Results  Component Value Date   INR 2.34* 03/27/2012   INR 2.46* 03/26/2012   INR 1.90* 03/25/2012   No results found for this basename: PTT    Physical Examination  BP Readings from Last 3 Encounters:  03/27/12 144/89  03/27/12 144/89  03/27/12 144/89   Temp Readings from Last 3 Encounters:  03/27/12 98.9 F (37.2 C) Oral  03/27/12 98.9 F (37.2 C) Oral  03/27/12 98.9 F (37.2 C) Oral   SpO2 Readings from Last 3 Encounters:  03/27/12 98%  03/27/12 98%  03/27/12 98%   Pulse Readings from Last 3 Encounters:  03/27/12 99  03/27/12 99  03/27/12 99    Pt is A&O x 3 right lower extremity: Incision/s is/are clean,dry.intact, and  healing without hematoma, erythema or drainage Limb is warm; with good color.  Toes are  dark, but now warm to touch.  Right Dorsalis Pedis pulse is palpable RightPosterior tibial pulse is  palpable    Assessment/Plan: Pt. Doing well Post-op pain is controlled Wounds are clean, dry, intact or healing well I ordered a rolling walker for home use. F/U in the office with Dr. Edilia Bo in 2-3 weeks.  Clinton Gallant [facility] [phone] 03/27/2012 8:30 AM  Agree with above. Please see my note from earlier today.  Di Kindle. Edilia Bo, MD, FACS Beeper [phone] 03/28/2012

## 2012-03-27 NOTE — Telephone Encounter (Signed)
Message copied by Fredrich Birks on Wed Mar 27, 2012 10:02 AM ------      Message from: Melene Plan      Created: Wed Mar 27, 2012  9:09 AM                   ----- Message -----         From: Lars Mage, PA         Sent: 03/27/2012   8:35 AM           To: Melene Plan, RN            F/U with Dr. Edilia Bo 2-3 weeks s/p  Tibial thrombectomy right LE he had ABI in the hospital.  Toes are demarcating.

## 2012-03-27 NOTE — Telephone Encounter (Signed)
LVM for pt and sent letter regarding follow up, dpm

## 2012-03-27 NOTE — Progress Notes (Signed)
Patient ID: JAMARII BANKS, male   DOB: [birth year], 56 y.o.   MRN: [id number] 15 Days Post-Op  Subjective: Says right foot pain is a little better but still requiring IV pain meds.  No abd C/O, eating well  Objective: Vital signs in last 24 hours: Temp:  [98.4 F (36.9 C)-98.9 F (37.2 C)] 98.9 F (37.2 C) (08/21 7829) Pulse Rate:  [94-109] 99  (08/21 0613) Resp:  [20] 20  (08/21 0613) BP: (142-144)/(81-89) 144/89 mmHg (08/21 0613) SpO2:  [96 %-98 %] 98 % (08/21 0613) Last BM Date: 03/26/12  Intake/Output from previous day: 08/20 0701 - 08/21 0700 In: 600 [P.O.:600] Out: 675 [Urine:125; Stool:550] Intake/Output this shift:    General appearance: alert and no distress GI: normal findings: soft, non-tender and stoma healthy  Lab Results:   Basename 03/26/12 0500  WBC 9.7  HGB 8.2*  HCT 25.1*  PLT 1044*   BMET No results found for this basename: NA:2,K:2,CL:2,CO2:2,GLUCOSE:2,BUN:2,CREATININE:2,CALCIUM:2 in the last 72 hours   Studies/Results: No results found.  Anti-infectives:   Assessment/Plan: s/p Procedure(s): EMBOLECTOMY INTRA OPERATIVE ARTERIOGRAM Total abd colectomy and ileostomy Doing well.  I will increase Fentanyl patch to and he will try to get by on oral pain meds Plan discharge tomorrow   LOS: 16 days    Shahira Fiske T 03/27/2012

## 2012-03-27 NOTE — Progress Notes (Signed)
ANTICOAGULATION CONSULT NOTE - Follow Up Consult  Pharmacy Consult:  Coumadin Indication:  VTE + Hypercoagulable state (AT III Deficiency)  Allergies  Allergen Reactions  . Mesalamine     REACTION: Rash    Patient Measurements: Height: 5\' 9"  (175.3 cm) Weight: 145 lb 1 oz (65.8 kg) IBW/kg (Calculated) : 70.7   Vital Signs: Temp: 98.9 F (37.2 C) (08/21 0613) Temp src: Oral (08/21 0613) BP: 144/89 mmHg (08/21 0613) Pulse Rate: 99  (08/21 0613)  Labs:  Basename 03/27/12 0545 03/26/12 0500 03/25/12 0600  HGB -- 8.2* --  HCT -- 25.1* --  PLT -- 1044* --  APTT -- -- --  LABPROT 26.0* 27.1* 22.1*  INR 2.34* 2.46* 1.90*  HEPARINUNFRC -- -- --  CREATININE -- -- --  CKTOTAL -- -- --  CKMB -- -- --  TROPONINI -- -- --    Estimated Creatinine Clearance: 99.4 ml/min (by C-G formula based on Cr of 0.37).     Assessment: 56 yo male with history of severe Crohn's colitis s/p partial colectomy at [facility] 8/5 then tranferred to [facility] for urgent thrombectomy 8/6 for ischemic right foot. Now s/p right tibial embolectomy with course of clot unclear.  Pharmacy consulted to manage patient's Coumadin therapy for his hypercoagulable state.  INR therapeutic today, no bleeding reported.     Goal of Therapy:  INR 2-3 Monitor platelets by anticoagulation protocol: Yes    Plan:  - Repeat Coumadin 5mg  PO today - D/C Lovenox as INR therapeutic - Daily PT / INR      Sarahy Creedon D. Laney Potash, PharmD, BCPS Pager:  [phone] 03/27/2012, 10:53 AM

## 2012-03-27 NOTE — Progress Notes (Signed)
HHPT and HHRN arranged with [facility] by patient choice. Pt also needs rolling walker for ambulation.  Address and phone number listed in EPIC are correct.

## 2012-03-28 ENCOUNTER — Telehealth: Payer: Self-pay

## 2012-03-28 LAB — PROTIME-INR: Prothrombin Time: 27 seconds — ABNORMAL HIGH (ref 11.6–15.2)

## 2012-03-28 MED ORDER — WARFARIN - PHARMACIST DOSING INPATIENT: 1.0000 | Freq: Every day | Status: DC

## 2012-03-28 MED ORDER — FENTANYL 50 MCG/HR TD PT72: 1.0000 | MEDICATED_PATCH | TRANSDERMAL | Status: AC

## 2012-03-28 MED ORDER — OXYCODONE-ACETAMINOPHEN 5-325 MG PO TABS: 1.0000 | ORAL_TABLET | ORAL | Status: AC | PRN

## 2012-03-28 NOTE — Progress Notes (Signed)
Pt's father and sister asking several questions re: pt's home care for discharge today. He initially had PT and RN ordered to assist with ostomy and wound care. Family aware that wound care will need to be taught to someone in the home. I reviewed infection signs/symptoms with the family so they would know what to watch for and notify MD. Family also concerned about pt's level of mobility. Agreeable to [facility] also working with the patient and for asking for CSW to visit in case SNF ultimately is needed. Family is aware that as long as pt has capacity to make own decisions and declines SNF, he can't be forced to go.   Pt also discharging on Coumadin and the recommendations below are from the PharmD here in the hospital.  Dr. Edilia Bo wishes for pt's primary, [name], MD, to manage the Coumadin/INR.   - Would recommend d/c home with 5mg  Coumadin tabs, to be taken as follows: 1.5 tab (7.5mg ) Monday through Thursday, 1 tab (5mg ) on Friday-Sunday.  - Recommend next INR check on or around Wednesday 8/28

## 2012-03-28 NOTE — Progress Notes (Signed)
DC home with brother. DC instructions read to pt and family(verbally understood by pt and family). No questions asked. Ostomy bags and dsg supplies sent home with pt. HH arrangements made.

## 2012-03-28 NOTE — Progress Notes (Signed)
PICC d/c'ed per order. Pressure applied to site without bleeding noted.  Vaseline guaze and 4x4 applied to site.  Pt verbalizes understanding of signs of infection, to change dressing in 48 hours and to apply pressure if bleeding occurs- teach back method.  No ADN.

## 2012-03-28 NOTE — Telephone Encounter (Signed)
Patient newly started on Coumadin, patient will be discharged today from [facility]. Last PT/INR 2.45/27.0, currently taking 7.5mg  M-Th and 5mg  on F,Sat,Sun. Next PT/INR Due 08/28. Question: Is Dr.Paz ok with patient staying on current regimen?    Side Note: Patient will have to be contacted to schedule appointment for follow-up with Dr.Paz (PT/INR check) near or on 04/03/12

## 2012-03-28 NOTE — Discharge Instructions (Signed)
[facility]      [facility] °[phone] ° °OPEN ABDOMINAL SURGERY: POST OP INSTRUCTIONS ° °Always review your discharge instruction sheet given to you by the facility where your surgery was performed. ° °IF YOU HAVE DISABILITY OR FAMILY LEAVE FORMS, YOU MUST BRING THEM TO THE OFFICE FOR PROCESSING.  PLEASE DO NOT GIVE THEM TO YOUR DOCTOR. ° °1. A prescription for pain medication may be given to you upon discharge.  Take your pain medication as prescribed, if needed.  If narcotic pain medicine is not needed, then you may take acetaminophen (Tylenol) or ibuprofen (Advil) as needed. °2. Take your usually prescribed medications unless otherwise directed. °3. If you need a refill on your pain medication, please contact your pharmacy. They will contact our office to request authorization.  Prescriptions will not be filled after 5pm or on week-ends. °4. You should follow a light diet the first few days after arrival home, such as soup and crackers, pudding, etc.unless your doctor has advised otherwise. A high-fiber, low fat diet can be resumed as tolerated.   Be sure to include lots of fluids daily. Most patients will experience some swelling and bruising on the chest and neck area.  Ice packs will help.  Swelling and bruising can take several days to resolve °5. Most patients will experience some swelling and bruising in the area of the incision. Ice pack will help. Swelling and bruising can take several days to resolve..  °6. It is common to experience some constipation if taking pain medication after surgery.  Increasing fluid intake and taking a stool softener will usually help or prevent this problem from occurring.  A mild laxative (Milk of Magnesia or Miralax) should be taken according to package directions if there are no bowel movements after 48 hours. °7.  You may have steri-strips (small skin tapes) in place directly over the incision.  These strips should be left on the skin for 7-10 days.  If your  surgeon used skin glue on the incision, you may shower in 24 hours.  The glue will flake off over the next 2-3 weeks.  Any sutures or staples will be removed at the office during your follow-up visit. You may find that a light gauze bandage over your incision may keep your staples from being rubbed or pulled. You may shower and replace the bandage daily. °8. ACTIVITIES:  You may resume regular (light) daily activities beginning the next day--such as daily self-care, walking, climbing stairs--gradually increasing activities as tolerated.  You may have sexual intercourse when it is comfortable.  Refrain from any heavy lifting or straining until approved by your doctor. °a. You may drive when you no longer are taking prescription pain medication, you can comfortably wear a seatbelt, and you can safely maneuver your car and apply brakes °b. Return to Work: ___________________________________ °9. You should see your doctor in the office for a follow-up appointment approximately two weeks after your surgery.  Make sure that you call for this appointment within a day or two after you arrive home to insure a convenient appointment time. °OTHER INSTRUCTIONS:  °_____________________________________________________________ °_____________________________________________________________ ° °WHEN TO CALL YOUR DOCTOR: °1. Fever over 101.0 °2. Inability to urinate °3. Nausea and/or vomiting °4. Extreme swelling or bruising °5. Continued bleeding from incision. °6. Increased pain, redness, or drainage from the incision. °7. Difficulty swallowing or breathing °8. Muscle cramping or spasms. °9. Numbness or tingling in hands or feet or around lips. ° °The clinic staff is available to   answer your questions during regular business hours.  Please dont hesitate to call and ask to speak to one of the nurses if you have concerns.  For further questions, please visit [email or web address]  Warfarin  Warfarin is a blood thinner  (anticoagulant) medicine. It is used to keep clots from forming in your blood. When you take warfarin, you may bruise easily. You may also bleed a little longer if you cut yourself.  Before taking warfarin, tell your doctor if:  You take any other medicine for your heart or blood pressure.   You are pregnant.   You plan to get pregnant.   You are breastfeeding.   You are allergic to any medicine.  HOME CARE  Take warfarin as told by your doctor. Do not stop the medicine unless your doctor tells you to.   Take your medicine at the same time every day.   Do not take anything that has aspirin in it unless your doctor says it is okay.   Do not drink alcohol.   Tell all your doctors and dentists that you are taking warfarin before they treat you or give you any medicines.   Keep all your appointments for doctor visits and blood tests.   Keep warfarin out of reach of children. Do not share warfarin with anyone.   Eat about the same amount of vitamin K foods every day.   High vitamin K foods: Beef liver. Pork liver. Green tea. Broccoli. Brussels sprouts. Cauliflower. Chickpeas. Kale. Spinach. Turnip greens.   Medium vitamin K foods: Chicken liver. Pork tenderloin. Cheddar cheese. Rolled oats. Coffee. Asparagus. Cabbage. Iceberg lettuce.   Low vitamin K foods: Apples. Butter. Bananas. Skim or 1% milk. Orange rice. Canned pears. White bread. Strawberries. Corn. Tomatoes. Green beans. Eggs. Potatoes. Tomasa Blase. Pumpkin. Chicken breasts. Ground beef. Oil (except soybean oil).  GET HELP RIGHT AWAY IF:  You miss a dose of warfarin. Do not take 2 doses at the same time.   You have a skin rash.   You have heavy or unusual bleeding.   There is blood in your pee (urine) or poop (stool).   You have side effects from medicine that do not get better after a few days.  MAKE SURE YOU:  Understand these instructions.   Will watch your condition.   Will get help right away if you are not  doing well or get worse.  Document Released: 08/26/2010 Document Revised: 07/13/2011 Document Reviewed: 08/26/2010 [facility] Patient Information 2012 [place], [place].

## 2012-03-28 NOTE — Consult Note (Signed)
Ostomy follow-up:  Pt assisted with pouch change.  He is able to apply pouch which has been cut out, and empty into urinal and close velcro.  He has limited mobility at home and will not be able to travel into bathroom to empty, and will need container at bedside.  He is able to apply pouch when he can see area; this may also be difficult related to immobility issues.  Plans to have home health assistance after discharge.  Educational supplies left at bedside, pt has been placed on [facility] discharge program.  Reviewed pouching routines, dietary precautions, and ordering supplies.  Pt asks appropriate questions.  Stoma red and viable, above skin level, 1 1/2 inches.  Emptied 50cc liquid brown stool from pouch. Plans to D/C home today according to progress notes.  Cammie Mcgee, RN, MSN, [employer]  [phone]

## 2012-03-28 NOTE — Progress Notes (Signed)
Physical Therapy Treatment Patient Details Name: Rick Edwards MRN: [id number] DOB: [birth year] Today's Date: 03/28/2012 Time: 1330-1350 PT Time Calculation (min): 20 min  PT Assessment / Plan / Recommendation Comments on Treatment Session  Pt did well with gait today and with stairs.  Cues for proper sequence at times.  Pt given handout on stair training with RW.      Follow Up Recommendations  Home health PT    Barriers to Discharge        Equipment Recommendations   (RW and BSC delivered today.  [name])    Recommendations for Other Services    Frequency Min 4X/week   Plan Discharge plan remains appropriate;Frequency remains appropriate    Precautions / Restrictions Precautions Precautions: Fall Restrictions RLE Weight Bearing: Weight bearing as tolerated   Pertinent Vitals/Pain Some discomfort with WB    Mobility  Bed Mobility Supine to Sit: 6: Modified independent (Device/Increase time) Sitting - Scoot to Edge of Bed: 6: Modified independent (Device/Increase time) Transfers Transfers: Sit to Stand;Stand to Sit Sit to Stand: 5: Supervision;From bed;With upper extremity assist Stand to Sit: With upper extremity assist;To chair/3-in-1;6: Modified independent (Device/Increase time) Ambulation/Gait Ambulation/Gait Assistance: 4: Min guard Ambulation Distance (Feet): 100 Feet Assistive device: Rolling walker Ambulation/Gait Assistance Details: Cues to push RW further out.  Pt putting more weight thru R LE as gait progressed. Gait Pattern: Antalgic Stairs Assistance: 4: Min guard Stairs Assistance Details (indicate cue type and reason): pt with good recall of which leg to go and down with.  Cues for when to put RW down when descending. Stair Management Technique: Step to pattern;Backwards;With walker;No rails Number of Stairs: 3     Exercises     PT Diagnosis:    PT Problem List:   PT Treatment Interventions:     PT Goals Acute Rehab PT Goals PT Goal  Formulation: With patient Pt will go Sit to Stand: with supervision;with upper extremity assist PT Goal: Sit to Stand - Progress: Met Pt will Ambulate: 51 - 150 feet;with supervision;with least restrictive assistive device PT Goal: Ambulate - Progress: Progressing toward goal Pt will Go Up / Down Stairs: 3-5 stairs;with min assist;with least restrictive assistive device PT Goal: Up/Down Stairs - Progress: Met  Visit Information  Last PT Received On: 03/28/12 Assistance Needed: +1    Subjective Data  Subjective: "I can do this." Patient Stated Goal: [name]   Cognition  Overall Cognitive Status: Appears within functional limits for tasks assessed/performed Arousal/Alertness: Awake/alert Orientation Level: Appears intact for tasks assessed Behavior During Session: [name] for tasks performed    Balance     End of Session PT - End of Session Equipment Utilized During Treatment: Gait belt Activity Tolerance: Patient tolerated treatment well Patient left: in chair;with call bell/phone within reach Nurse Communication: Mobility status   GP     Rick Edwards 03/28/2012, 2:05 PM

## 2012-03-28 NOTE — Progress Notes (Signed)
ANTICOAGULATION CONSULT NOTE - Follow Up Consult  Pharmacy Consult:  Coumadin Indication:  VTE + Hypercoagulable state (AT III Deficiency)  Allergies  Allergen Reactions  . Mesalamine     REACTION: Rash    Patient Measurements: Height: 5\' 9"  (175.3 cm) Weight: 145 lb 1 oz (65.8 kg) IBW/kg (Calculated) : 70.7   Vital Signs: Temp: 97.7 F (36.5 C) (08/22 0519) Temp src: Oral (08/22 0519) BP: 111/59 mmHg (08/22 0519) Pulse Rate: 91  (08/22 0519)  Labs:  Basename 03/28/12 0500 03/27/12 0545 03/26/12 0500  HGB -- -- 8.2*  HCT -- -- 25.1*  PLT -- -- 1044*  APTT -- -- --  LABPROT 27.0* 26.0* 27.1*  INR 2.45* 2.34* 2.46*  HEPARINUNFRC -- -- --  CREATININE -- -- --  CKTOTAL -- -- --  CKMB -- -- --  TROPONINI -- -- --    Estimated Creatinine Clearance: 99.4 ml/min (by C-G formula based on Cr of 0.37).  Assessment: 56 yo male with history of severe Crohn's colitis s/p partial colectomy at [facility] 8/5 then tranferred to [facility] for urgent thrombectomy 8/6 for ischemic right foot. Now s/p right tibial embolectomy with course of clot unclear.  Pharmacy was consulted to manage patient's Coumadin therapy for his hypercoagulable state.   INR therapeutic today, no bleeding reported.  Noted patient pending discharge to home this AM.  Goal of Therapy:  INR 2-3 Monitor platelets by anticoagulation protocol: Yes    Plan:  - Would recommend d/c home with 5mg  Coumadin tabs, to be taken as follows: 1.5 tab (7.5mg ) Monday through Thursday, 1 tab (5mg ) on Friday-Sunday. - Recommend next INR check on or around Wednesday 8/28  Thanks,  Tzipporah Nagorski K. Allena Katz, PharmD, BCPS.  Clinical Pharmacist Pager [phone]. 03/28/2012 10:46 AM

## 2012-03-28 NOTE — Discharge Summary (Signed)
Patient ID: Rick Edwards [id number] 56 y.o. 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 He has progressively worsening symptoms over at least one year of postprandial abdominal pain bloating and weight loss. Extensive workup including CT scan and colonoscopy has shown partial obstruction of his descending colon with a markedly dilated transverse colon apparently secondary to progressive Crohn's colitis. He has had significant weight loss. With these findings we recommended proceeding with colectomy and possible ostomy depending on findings. He is electively admitted for this procedure.  Hospital Course: the patient was admitted on the morning of this procedure. Operative findings were consistent with a severe Crohn's colitis with 2 large areas of inflammatory change, one in the transverse colon and another in the descending colon causing high-grade obstruction. He had significant dilatation and friability of his small intestine as well. He underwent total abdominal colectomy with a stapled Hartmann pouch at the mid sigmoid colon and end ileostomy. On the first postoperative morning he complained of severe right foot pain and was found to have a cool pulseless right foot. The patient was evaluated emergently by vascular surgery and was taken to the operating room and underwent a popliteal and posterior tibial embolectomy with findings of emboli to  the right lower extremity. Postoperatively he was stable but continued to have some ischemic changes in the toes of his right foot. It was not felt that any surgical intervention would be beneficial. The patient was maintained on full anticoagulation with IV heparin. Workup included echocardiogram that was negative. Hypercoagulability panel showed an anti-thrombin 3 deficiency. The patient was transferred out of stepped down after several days. His bowel function gradually returned and NG tube was removed and he was begun on diet slowly. He was maintained on hyperalimentation perioperatively. Due to contamination at surgery he received 1 week of antibiotics but had no evidence of intra-abdominal infection. His wound was left open with daily moist saline dressing changes. At about one week postoperatively he complained of the onset of severe left upper quadrant abdominal pain. CT scan of the chest and abdomen was obtained. This was negative for pulmonary embolus but he did have a splenic infarct which explained his pain. This was treated with expected management and pain medication and gradually improved throughout the remainder of his hospitalization. He received physical therapy although ambulation was difficult secondary to his right foot pain. He was transitioned to oral warfarin and heparin discontinued. His by mouth intake improved and TNA was discontinued. At the time of discharge he is tolerating a regular diet well. Abdomen is soft and nontender. He has a clean open midline incision with daily moist saline dressing changes. Ileostomy is functioning well. He has ischemic changes of the toes on his right foot and the plan per vascular surgery is followup as an outpatient and he will likely need a distal foot amputation as this area demarcates. Final pathology showed Crohn's colitis.  Consults: vascular surgery and medical hospitalist  Disposition: Home  Patient Instructions:   Rick Edwards, Rick Edwards  Home  Medication Instructions  AVW:[id number]   Printed on:03/28/12 0841  Medication Information                    spironolactone (ALDACTONE) 25 MG tablet Take 25 mg by mouth every morning.           amLODipine (NORVASC) 5 MG tablet Take 5 mg by mouth every morning.           fentaNYL (DURAGESIC - DOSED MCG/HR) 50 MCG/HR Place 1 patch (50 mcg total) onto the skin every 3 (three) days.           oxyCODONE-acetaminophen (PERCOCET/ROXICET) 5-325 MG per tablet Take 1 tablet by mouth every 4 (four) hours as needed for pain.           Warfarin - Pharmacist Dosing Inpatient MISC 1 each by Does not apply route daily at 6 PM.             Activity: activity as tolerated Diet: regular diet Wound Care: moist saline dressing changes daily to abdominal wound  Follow-up:  With Dr. Johna Sheriff in 2 weeks.  Signed: Mariella Saa MD, FACS  03/28/2012, 8:41 AM

## 2012-03-29 ENCOUNTER — Telehealth (INDEPENDENT_AMBULATORY_CARE_PROVIDER_SITE_OTHER): Payer: Self-pay

## 2012-03-29 MED ORDER — WARFARIN SODIUM 2.5 MG PO TABS: 2.5000 mg | ORAL_TABLET | Freq: Every day | ORAL | Status: DC

## 2012-03-29 MED ORDER — SPIRONOLACTONE 25 MG PO TABS: 25.0000 mg | ORAL_TABLET | Freq: Every morning | ORAL | Status: DC

## 2012-03-29 NOTE — Telephone Encounter (Signed)
Discussed with [name], scheduled PT check on Monday per Dr. Drue Novel.

## 2012-03-29 NOTE — Telephone Encounter (Signed)
Continue same Coumadin, schedule a Coumadin check 04/01/2012 (Monday)

## 2012-03-29 NOTE — Telephone Encounter (Signed)
LMOVM for pt to return call 

## 2012-03-29 NOTE — Telephone Encounter (Signed)
Angie at [facility] called wanting to know which MD will manage coumadin as OP. Angie advised per Epic note pt has appt with [place] [facility] on 04-01-12.

## 2012-04-01 ENCOUNTER — Ambulatory Visit: Payer: No Typology Code available for payment source

## 2012-04-01 ENCOUNTER — Telehealth: Payer: Self-pay | Admitting: *Deleted

## 2012-04-01 NOTE — Telephone Encounter (Signed)
Patient's father called in to report that son's toes still had the clear blisters around toes. No bleeding (on Coumadin), new drainage or odor to wounds. Patient is afebrile. Physical therapist and home health nurse are coming out today and tomorrow per Mr. Jeffreys; He will make sure they look at the area and will call us back if we need to move up patient's 04-10-12 appt with Dr. Edilia Bo. HH nurse saw him yesterday and did not make any comments to the patient about this wounds changing. He voiced understanding of this plan and is in agreement.

## 2012-04-04 ENCOUNTER — Ambulatory Visit (INDEPENDENT_AMBULATORY_CARE_PROVIDER_SITE_OTHER): Payer: No Typology Code available for payment source | Admitting: *Deleted

## 2012-04-04 VITALS — BP 120/78 | HR 87 | Wt 111.0 lb

## 2012-04-04 DIAGNOSIS — D759 Disease of blood and blood-forming organs, unspecified: Secondary | ICD-10-CM

## 2012-04-04 DIAGNOSIS — Z7901 Long term (current) use of anticoagulants: Secondary | ICD-10-CM

## 2012-04-04 NOTE — Patient Instructions (Addendum)
INR today 2.4. Current dose is 7.5 mg daily, 5 mg Friday, Saturday and Sunday  Plan: 7.5 mg daily 5 mg Monday Wednesday and Friday INR goal 2-3 Next Coumadin 04-14-12 JP    Please schedule office visit at your convenience for Ear irrigation.

## 2012-04-05 ENCOUNTER — Telehealth (INDEPENDENT_AMBULATORY_CARE_PROVIDER_SITE_OTHER): Payer: Self-pay

## 2012-04-05 NOTE — Telephone Encounter (Signed)
Spoke to Rick Edwards patient father, patient need's a RTW note with any restrictions noted, patient father will come to office on Tuesday 04/09/12.

## 2012-04-09 ENCOUNTER — Telehealth: Payer: Self-pay | Admitting: *Deleted

## 2012-04-09 ENCOUNTER — Encounter: Payer: Self-pay | Admitting: Vascular Surgery

## 2012-04-09 NOTE — Telephone Encounter (Signed)
[facility] Triage Call Report Triage Record Num: [id number] Operator: Amy Head Patient Name: Rick Edwards Call Date & Time: 04/07/2012 8:43:48AM Patient Phone: [phone] PCP: Patient Gender: Male PCP Fax : Patient DOB: 09/02/1955 Practice Name: [facility] Reason for Call: Caller: Gail/Sibling; PCP: [name]; CB#: [phone]; Call regarding Medication Issue; Medication(s): Tylenol, Motrin; Wants to know which one to give. "Having a chill." States that patient woke up this morning and asked for a blanket because he was cold. Does not have a fever. Parents (caregivers) want to know if they can give Tylenol or Motrin if patient does develop a fever. Having no symptoms of anything and no problems with surgical site. Has a home health nurse that was just out to assess patient yesterday, 04/06/12. All emergent symptoms per Post Op Problems protocol ruled out. Home care advice given. Verified Medications in EPIC and advised no Tylenol or Motrin and to call back if any symptoms develop or any further questions. Protocol(s) Used: Postoperative Problems Recommended Outcome per Protocol: Provide Home/Self Care Reason for Outcome: Surgery within the past 2 weeks AND has questions Care Advice: Use good hand washing technique before touching surgical site to prevent an infection or decrease the risk of worsening an infection. ~ ~ SYMPTOM / CONDITION MANAGEMENT ~ INFECTION CONTROL ~ CAUTIONS Follow surgeon's suggestions for pain relief, incision care, diet, fluid intake, and postop anesthesia care. Call provider or surgeon for any other questions and for clarification of postop instructions. ~ POST-OP PAIN RELIEF: - If narcotic analgesic ordered, take as directed. Drug dependence or tolerance is not a common problem for most individuals. - Call provider for further questions about prescription pain medications. - Take medications with food or on a full stomach to avoid GI  upset or possible bleeding. ~ POST-OP DIET: - Maintain good fluid intake; 6 to 8 eight-ounce glasses of liquid/day. - Cool fluids may help soothe dry scratchy throat following general anesthesia. - Start with light diet (day of surgery if outpatient procedure). - Gradually increase to regular diet. - Frequent small meals may be better tolerated. - Avoid heavy, fatty, greasy foods for several days. ~ 04/07/2012 9:06:37AM Page 1

## 2012-04-09 NOTE — Telephone Encounter (Signed)
Pt states that he is doing fine no other episode beside the one incident that was report. Pt mom notes that after Pt given blank he was fine.

## 2012-04-09 NOTE — Telephone Encounter (Signed)
Agree , check on him again today or tomorrow

## 2012-04-09 NOTE — Telephone Encounter (Signed)
Left message to call office to check on Pt to see how he is doing today.

## 2012-04-09 NOTE — Telephone Encounter (Signed)
Noted  

## 2012-04-10 ENCOUNTER — Encounter: Payer: Self-pay | Admitting: Vascular Surgery

## 2012-04-10 ENCOUNTER — Telehealth: Payer: Self-pay | Admitting: *Deleted

## 2012-04-10 ENCOUNTER — Ambulatory Visit (INDEPENDENT_AMBULATORY_CARE_PROVIDER_SITE_OTHER): Payer: No Typology Code available for payment source | Admitting: Vascular Surgery

## 2012-04-10 VITALS — BP 151/100 | HR 106 | Temp 98.5°F | Resp 20 | Ht 69.0 in | Wt 120.0 lb

## 2012-04-10 DIAGNOSIS — Z48812 Encounter for surgical aftercare following surgery on the circulatory system: Secondary | ICD-10-CM

## 2012-04-10 DIAGNOSIS — I743 Embolism and thrombosis of arteries of the lower extremities: Secondary | ICD-10-CM | POA: Insufficient documentation

## 2012-04-10 NOTE — Telephone Encounter (Signed)
Discuss with Morrie Sheldon will inform surgeon as per Dr Drue Novel instruction.

## 2012-04-10 NOTE — Telephone Encounter (Signed)
Left message to call office

## 2012-04-10 NOTE — Telephone Encounter (Signed)
Noted, recommend they actually report that to the pt's surgeon

## 2012-04-10 NOTE — Assessment & Plan Note (Signed)
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 He has a palpable femoral, popliteal, and posterior tibial pulse on the right with a biphasic dorsalis pedis and posterior tibial signal. He would prefer to allow the toes to auto amputate and so is doing daily lukewarm dials soaked soaks and keeping the toes dry. We have also discussed the option of proceeding with formal transmetatarsal amputation however is reluctant to proceed at this time. He has an antithrombin 3 deficiency and is on Coumadin. I'll see him back in 3 weeks. He knows to call sooner if he has problems.

## 2012-04-10 NOTE — Telephone Encounter (Signed)
[facility] Triage Call Report Triage Record Num: [id number] Operator: Tomasita Crumble Patient Name: Rick Edwards Call Date & Time: 04/09/2012 6:11:11PM Patient Phone: [phone] PCP: Nolon Rod. Paz Patient Gender: Male PCP Fax : Patient DOB: October 26, 1955 Practice Name: [facility] Reason for Call: Wonda Cerise, RN from [facility] calling [phone]. Caller states she would like to inform provider that patient's insurance only pays for 6 home visits and she plans to hold the remaining visit scheduled for this week until later as his father is doing a great job with dressing changes. Information noted and sent to office as information; advised to follow up during regular hours per nursing judgment and PCP Calls, No Triage protocol. Protocol(s) Used: PCP Calls, No Triage (Adult) Recommended Outcome per Protocol: Call Provider within 24 Hours Reason for Outcome: [1] Caller requests to speak ONLY to PCP AND [2] nonurgent question Care Advice: ~

## 2012-04-10 NOTE — Progress Notes (Signed)
[facility] [place]  Patient name: Rick Edwards MRN: [id number] DOB: 03/29/1956 Sex: male  REASON FOR VISIT: follow up of dry gangrene of the toes of the right foot.  HPI: Rick Edwards is a 56 y.o. male who underwent a colectomy for Crohn's disease and postoperatively was noted to have an ischemic right foot. He underwent right popliteal and tibial peroneal trunk embolectomy with vein patch angioplasty and also posterior tibial artery embolectomy and vein patch angioplasty. 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 He has developed dry gangrene of the toes of the right foot which we have been following. He comes in for outpatient visit. He has noted minimal drainage from the right foot wound. He denies any fever. He said minimal pain in the right foot.   REVIEW OF SYSTEMS: Arly.Keller ] denotes positive finding; [  ] denotes negative finding  CARDIOVASCULAR:  [ ]  chest pain   [ ]  dyspnea on exertion    CONSTITUTIONAL:  [ ]  fever   [ ]  chills  PHYSICAL EXAM: Filed Vitals:   04/10/12 1345  BP: 151/100  Pulse: 106  Temp: 98.5 F (36.9 C)  TempSrc: Oral  Resp: 20  Height: 5\' 9"  (1.753 m)  Weight: 120 lb (54.432 kg)  SpO2: 99%   Body mass index is 17.72 kg/(m^2). GENERAL: The patient is a well-nourished male, in no acute distress. The vital signs are documented above. CARDIOVASCULAR: There is a regular rate and rhythm  PULMONARY: There is good air exchange bilaterally without wheezing or rales. He has a palpable right femoral, right popliteal, and right posterior tibial pulse. He has a biphasic dorsalis pedis signal and a biphasic posterior tibial signal on the right. He has dry gangrene of all toes of the right foot. His right leg incision is healed.  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 He has a palpable femoral,  popliteal, and posterior tibial pulse on the right with a biphasic dorsalis pedis and posterior tibial signal. He would prefer to allow the toes to auto amputate and so is doing daily lukewarm dials soaked soaks and keeping the toes dry. We have also discussed the option of proceeding with formal transmetatarsal amputation however is reluctant to proceed at this time. He has an antithrombin 3 deficiency and is on Coumadin. I'll see him back in 3 weeks. He knows to call sooner if he has problems.   Shevon Sian S [facility] [place] Beeper: [phone]

## 2012-04-11 ENCOUNTER — Ambulatory Visit (INDEPENDENT_AMBULATORY_CARE_PROVIDER_SITE_OTHER): Payer: No Typology Code available for payment source | Admitting: Internal Medicine

## 2012-04-11 ENCOUNTER — Telehealth (INDEPENDENT_AMBULATORY_CARE_PROVIDER_SITE_OTHER): Payer: Self-pay | Admitting: General Surgery

## 2012-04-11 VITALS — BP 128/84 | HR 86 | Temp 98.4°F | Wt 121.0 lb

## 2012-04-11 DIAGNOSIS — I743 Embolism and thrombosis of arteries of the lower extremities: Secondary | ICD-10-CM

## 2012-04-11 DIAGNOSIS — H6120 Impacted cerumen, unspecified ear: Secondary | ICD-10-CM

## 2012-04-11 MED ORDER — WARFARIN SODIUM 5 MG PO TABS: ORAL_TABLET | ORAL | Status: DC

## 2012-04-11 MED ORDER — WARFARIN SODIUM 2.5 MG PO TABS: ORAL_TABLET | ORAL | Status: DC

## 2012-04-11 NOTE — Assessment & Plan Note (Addendum)
INR 2.8 today, patient's father helps him with Coumadin, he's not completely sure about how much Coumadin he is on but thinks that he is taking the right dose. Plan: No change--->  7.5 mg daily, 5 mg Friday, Saturday and Sunday  Very clear instructions provided, next INR one week

## 2012-04-11 NOTE — Addendum Note (Signed)
Addended by: Sharee Pimple on: 04/11/2012 10:44 AM   Modules accepted: Orders

## 2012-04-11 NOTE — Telephone Encounter (Signed)
Morrie Sheldon, nurse with [facility], calling with update on pt's wound.  Because of insurance constraints, they are only visiting weekly.  Pt's family has been taught dressings and are doing them well.  The wound is looking very good.  [facility] will make their last visit next week.

## 2012-04-11 NOTE — Progress Notes (Signed)
  Subjective:    Patient ID: Rick Edwards, male    DOB: 03-23-1956, 56 y.o.   MRN: [id number]  HPI Here with his father, [name]. His chief complain is left ear wax, request ear to be flushed. [name] is not sure about the Coumadin dose, requests an  INR check. He was admitted to the hospital last month, chart is reviewed, past medical history and past surgical history are updated.    Past Medical History: HTN Hearing loss , B, has aL hearing aid CROHNS DISEASE -- see surgeries   03-2012 Splenic infarct, ischemic right foot after colectomy a-2013, on Coumadin, ECHO (-), dx w/ anti-throm def  03-2012 anti-thrombin 3 deficiency. upper GI bleed with normal EGD  6-11, 3 PRBCs   Past Surgical History: rt hemicolectomy 08/29/2001 (perforated abscess) ANAL FISTULOTOMY, ABSCESS 2002 03-2012 total abdominal colectomy with ileostomy do to Crohn's disease 03-2012 popliteal and posterior tibial embolectomy Family History: CAD - no DM - no HTN -- "the whole family" stroke - GF, aunts colon ca - no prostate Ca - no  Social History: not married, no children, lives by himself, his father [name] helps him [phone]) Alcohol -- denies Tobacco-- rarely   Drug use-no Regular exercise-no, active at work works @ [employer]        Review of Systems Denies any ear pain or ear discharge Denies any acute problems with the abdominal incision or foot, reports he follows up with vascular and general surgery.    Objective:   Physical Exam Alert oriented x3, no apparent distress. Ears, wax bilaterally.     Assessment & Plan:   Current Outpatient Rx  Name Route Sig Dispense Refill  . AMLODIPINE BESYLATE 5 MG PO TABS Oral Take 5 mg by mouth every morning.    . AZATHIOPRINE 50 MG PO TABS  Take 1 and 1/2 tab    . OXYCODONE-ACETAMINOPHEN 5-325 MG PO TABS Oral Take 1 tablet by mouth every 4 (four) hours as needed.    Marland Kitchen SPIRONOLACTONE 25 MG PO TABS Oral Take 1 tablet (25 mg total) by mouth every  morning. 90 tablet 0  . WARFARIN SODIUM 2.5 MG PO TABS  As directed 30 tablet 0  . FENTANYL 50 MCG/HR TD PT72 Transdermal Place 1 patch (50 mcg total) onto the skin every 3 (three) days. 10 patch 0  . WARFARIN SODIUM 5 MG PO TABS  As directed 30 tablet 0

## 2012-04-11 NOTE — Patient Instructions (Addendum)
Coumadin 5 mg tablet  plus Coumadin 2.5 mg tablet   Tuesday, Thursday, Saturday and Sunday On Mondays Wednesdays and Fridays, only take coumadin 5 mg  1 tablet. ---- Use peroxide over-the-counter 3 drops on each ear twice a day ---- Please come back in one week for another office visit ( Coumadin check and another ear lavage)

## 2012-04-12 ENCOUNTER — Encounter: Payer: Self-pay | Admitting: Internal Medicine

## 2012-04-12 DIAGNOSIS — H6120 Impacted cerumen, unspecified ear: Secondary | ICD-10-CM | POA: Insufficient documentation

## 2012-04-12 NOTE — Assessment & Plan Note (Signed)
Cerumen impaction, status post lavage, still has a lot of wax. We'll try again in one week. See instructions

## 2012-04-16 ENCOUNTER — Other Ambulatory Visit (HOSPITAL_BASED_OUTPATIENT_CLINIC_OR_DEPARTMENT_OTHER): Payer: No Typology Code available for payment source | Admitting: Lab

## 2012-04-16 DIAGNOSIS — D509 Iron deficiency anemia, unspecified: Secondary | ICD-10-CM

## 2012-04-16 LAB — CBC WITH DIFFERENTIAL/PLATELET
BASO%: 1.4 % (ref 0.0–2.0)
EOS%: 3.6 % (ref 0.0–7.0)
HCT: 35.2 % — ABNORMAL LOW (ref 38.4–49.9)
MCH: 29.6 pg (ref 27.2–33.4)
MCHC: 32.9 g/dL (ref 32.0–36.0)
MONO#: 0.5 10*3/uL (ref 0.1–0.9)
NEUT%: 70.6 % (ref 39.0–75.0)
RBC: 3.9 10*6/uL — ABNORMAL LOW (ref 4.20–5.82)
RDW: 17.2 % — ABNORMAL HIGH (ref 11.0–14.6)
WBC: 5.9 10*3/uL (ref 4.0–10.3)
lymph#: 0.9 10*3/uL (ref 0.9–3.3)

## 2012-04-16 LAB — IRON AND TIBC
%SAT: 17 % — ABNORMAL LOW (ref 20–55)
Iron: 49 ug/dL (ref 42–165)
TIBC: 283 ug/dL (ref 215–435)
UIBC: 234 ug/dL (ref 125–400)

## 2012-04-16 LAB — FERRITIN: Ferritin: 327 ng/mL — ABNORMAL HIGH (ref 22–322)

## 2012-04-18 ENCOUNTER — Ambulatory Visit (HOSPITAL_BASED_OUTPATIENT_CLINIC_OR_DEPARTMENT_OTHER): Payer: No Typology Code available for payment source | Admitting: Internal Medicine

## 2012-04-18 ENCOUNTER — Other Ambulatory Visit: Payer: No Typology Code available for payment source | Admitting: Lab

## 2012-04-18 ENCOUNTER — Encounter (INDEPENDENT_AMBULATORY_CARE_PROVIDER_SITE_OTHER): Payer: Self-pay | Admitting: General Surgery

## 2012-04-18 ENCOUNTER — Ambulatory Visit (INDEPENDENT_AMBULATORY_CARE_PROVIDER_SITE_OTHER): Payer: No Typology Code available for payment source | Admitting: General Surgery

## 2012-04-18 ENCOUNTER — Telehealth: Payer: Self-pay | Admitting: Internal Medicine

## 2012-04-18 VITALS — BP 140/99 | HR 105 | Temp 97.8°F | Resp 18 | Ht 69.0 in | Wt 123.3 lb

## 2012-04-18 VITALS — BP 132/94 | HR 86 | Temp 98.7°F | Ht 69.0 in | Wt 123.0 lb

## 2012-04-18 DIAGNOSIS — R799 Abnormal finding of blood chemistry, unspecified: Secondary | ICD-10-CM

## 2012-04-18 DIAGNOSIS — D509 Iron deficiency anemia, unspecified: Secondary | ICD-10-CM

## 2012-04-18 DIAGNOSIS — D649 Anemia, unspecified: Secondary | ICD-10-CM

## 2012-04-18 DIAGNOSIS — K5010 Crohn's disease of large intestine without complications: Secondary | ICD-10-CM

## 2012-04-18 NOTE — Telephone Encounter (Signed)
Gave pt appt calendar for December 2013 lab before MD visit

## 2012-04-18 NOTE — Progress Notes (Signed)
History: Patient returns for his first office visit following hospitalization for severe Crohn's colitis, status post subtotal colectomy and end ileostomy. His postoperative course was complicated by emboli to his right lower extremity into the spleen. He was found to have a hypercoagulable disorder and is now on Coumadin. This is being followed by Dr. Shirline Frees. He had embolectomy of his right lower extremity and has dry gangrene of his toes of the right foot and he and Dr. Piedad Climes and have elected to let these auto amputate. In regards to his abdomen he is doing quite well. He has noted some left groin pain when he gets up and walks around. He is eating very well and has put on 13 pounds since discharge. He has no difficulty taking care of his ileostomy.  Exam: BP 132/94  Pulse 86  Temp 98.7 F (37.1 C) (Temporal)  Ht 5\' 9"  (1.753 m)  Wt 123 lb (55.792 kg)  BMI 18.16 kg/m2  SpO2 94% General: Thin but well appearing Abdomen: Soft and nontender. Midline wound is clean granulation and nearly completely healed. Ileostomy appears healthy. Extremities: There is dry gangrene of the toes of the right foot but no evidence of infection and the more proximal foot is well-perfused.  Assessment and plan: Doing well at this point following colectomy for severe Crohn's colitis. No abdominal issues currently. He is to return in one month.

## 2012-04-18 NOTE — Patient Instructions (Signed)
Your CBC and iron level are unremarkable today. He had significant improvement in your platelets count.  Followup in 3 months.

## 2012-04-18 NOTE — Progress Notes (Signed)
[facility] [phone]   [phone]  OFFICE PROGRESS NOTE  [name], MD [phone] [address] [facility] [address] [place] [address]  PRINCIPAL DIAGNOSIS:  1) Iron deficiency anemia with reactive thrombocytosis.  2) Crohn's colitis with obstruction status post colectomy with end ileostomy and repair enterotomy under the care of Dr. Johna Sheriff on 03/11/2012 3) ischemic right foot status post right popliteal and tibial peroneal trunk embolectomy with vein patch angioplasty 1 03/12/2012  PRIOR THERAPY: The patient is status post intravenous Feraheme infusion. Last dose was given in June 2012.   CURRENT THERAPY: Observation.   INTERVAL HISTORY: AHMAAD WITMER 56 y.o. male returns to the clinic today for six-month followup visit. The patient had several surgical procedure performed recently as mentioned above including colectomy with end ileostomy as well as surgery for ischemic right foot. He'll significant amount of weight during this time. He is currently on Coumadin and his dose adjusted by Dr. Drue Novel. Has some fatigue and weakness. The patient has repeat CBC and iron study performed recently and he is here today for evaluation and discussion of his lab results.  MEDICAL HISTORY: Past Medical History  Diagnosis Date  . Crohn's disease   . Perianal abscess 2001    s/p right hemicolectomy, and drainage of retroperitoneal abcess 2003  . Hypertension   . Hearing loss   . GI bleed 6/11    w/ normal EGD 6/11, 3 PRBCs   . Anemia     anemia thrombocytopenia, saw Hematology 2011, can not r/o myeloproliferative d/c    ALLERGIES:  is allergic to mesalamine.  MEDICATIONS:  Current Outpatient Prescriptions  Medication Sig Dispense Refill  . amLODipine (NORVASC) 5 MG tablet Take 5 mg by mouth every morning.      Marland Kitchen azaTHIOprine (IMURAN) 50 MG tablet Take 1 and 1/2 tab      . fentaNYL (DURAGESIC - DOSED MCG/HR) 50 MCG/HR Place 1 patch (50 mcg total)  onto the skin every 3 (three) days.  10 patch  0  . oxyCODONE-acetaminophen (PERCOCET/ROXICET) 5-325 MG per tablet Take 1 tablet by mouth every 4 (four) hours as needed.      Marland Kitchen spironolactone (ALDACTONE) 25 MG tablet Take 1 tablet (25 mg total) by mouth every morning.  90 tablet  0  . warfarin (COUMADIN) 2.5 MG tablet As directed  30 tablet  0  . warfarin (COUMADIN) 5 MG tablet As directed  30 tablet  0    SURGICAL HISTORY:  Past Surgical History  Procedure Date  . Hemicolectomy 08/29/2001    perforated abscess  . Anal fistulotomy 2002  . Partial colectomy 03/11/2012    Procedure: PARTIAL COLECTOMY;  Surgeon: Mariella Saa, MD;  Location: [facility] ORS;  Service: General;  Laterality: N/A;  subtotal colectomy transverse and left colon   . Embolectomy 03/12/2012    Procedure: EMBOLECTOMY;  Surgeon: Chuck Hint, MD;  Location: [facility] OR;  Service: Vascular;  Laterality: Right;  Right popliteal embolectomy with vein patch angioplasty, right posterior tibial embolectomy with vein patch angioplasty   . Intraoperative arteriogram 03/12/2012    Procedure: INTRA OPERATIVE ARTERIOGRAM;  Surgeon: Chuck Hint, MD;  Location: [facility] OR;  Service: Vascular;  Laterality: Right;    REVIEW OF SYSTEMS:  A comprehensive review of systems was negative except for: Constitutional: positive for fatigue and weight loss   PHYSICAL EXAMINATION: General appearance: alert, cooperative and no distress Head: Normocephalic, without obvious abnormality, atraumatic Lymph nodes:  Cervical, supraclavicular, and axillary nodes normal. Resp: clear to auscultation bilaterally Cardio: regular rate and rhythm, S1, S2 normal, no murmur, click, rub or gallop GI: soft, non-tender; bowel sounds normal; no masses,  no organomegaly and Colostomy in place Extremities: extremities normal, atraumatic, no cyanosis or edema  ECOG PERFORMANCE STATUS: 1 - Symptomatic but completely ambulatory  Blood pressure 140/99, pulse 105,  temperature 97.8 F (36.6 C), temperature source Oral, resp. rate 18, height 5\' 9"  (1.753 m), weight 123 lb 4.8 oz (55.929 kg).  LABORATORY DATA: Lab Results  Component Value Date   WBC 5.9 04/16/2012   HGB 11.6* 04/16/2012   HCT 35.2* 04/16/2012   MCV 90.1 04/16/2012   PLT 425* 04/16/2012      Chemistry      Component Value Date/Time   NA 133* 03/24/2012 0515   K 3.5 03/24/2012 0515   CL 98 03/24/2012 0515   CO2 29 03/24/2012 0515   BUN 6 03/24/2012 0515   CREATININE 0.37* 03/24/2012 0515      Component Value Date/Time   CALCIUM 8.0* 03/24/2012 0515   ALKPHOS 256* 03/21/2012 0500   AST 23 03/21/2012 0500   ALT 38 03/21/2012 0500   BILITOT 0.4 03/21/2012 0500       RADIOGRAPHIC STUDIES: Dg Chest 2 View  03/20/2012  **ADDENDUM** CREATED: 03/20/2012 13:38:02  These results were called to Dr. Johna Sheriff.  The patient had recent abdominal surgery (8 days ago).  The patient's abdominal exam is benign.  Therefore, the free air is likely related to prior surgery.  **END ADDENDUM** SIGNED BY: Aubery Lapping. Dover, M.D.   03/20/2012  *RADIOLOGY REPORT*  Clinical Data: Left pleuritic chest pain.  CHEST - 2 VIEW  Comparison: Chest x-ray 03/01/2012.  Abdominal films 03/07/2012  Findings: Right PICC line has been placed with the tip in the SVC. Increasing opacity in the left lung base, possible pneumonia.  On the lateral view, there appears to be a moderate left pleural effusion, not well seen on the frontal view.  This may be loculated posteriorly.  Right lung is clear.  Heart is borderline in size.  Free air noted under the hemidiaphragms.  Recommend correlation for recent surgery.  If there has not been recent abdominal surgery, findings are concerning for perforated bowel.  IMPRESSION: New left lower lobe airspace opacity concerning for pneumonia. Suspect loculated posterior left pleural effusion.  Pneumoperitoneum, question recent surgery versus bowel perforation.  Original Report Authenticated By: Cyndie Chime, M.D.   Ct Chest W Contrast  03/20/2012  *RADIOLOGY REPORT*  Clinical Data: 56 year old male with pain.  Pleural effusion, pneumonia. Left upper abdominal pain.  Recent colectomy. Coagulopathy with recent lower extremity vascular surgery for embolectomy.  CT CHEST WITH CONTRAST  Technique:  Multidetector CT imaging of the chest was performed following the standard protocol during bolus administration of intravenous contrast.  Contrast: 80mL OMNIPAQUE IOHEXOL 300 MG/ML  SOLN chest radiograph from the same day and earlier.  Comparison: Chest radiograph from the same day and earlier.  Findings: A small to moderate volume of pneumoperitoneum in the visualized upper abdomen.  Free fluid in the left upper quadrant, may be primarily associated with small bowel, uncertain. Heterogeneous hypodensity in the inferior spleen.  Fluid also in the gallbladder fossa.  Mild respiratory motion artifact intermittently noted.  Incidental small intramuscular lipoma along the left lateral oblique muscles in the lower thorax.  Right PICC line.  Small to moderate layering left pleural effusion with adjacent compressive atelectasis.  No significant  air bronchograms.  No pneumothorax.  Layering opacity in the right lower lobe also most resembles atelectasis.  Centrilobular emphysema bilaterally.  No pneumomediastinum.  No pericardial effusion.  No mediastinal lymphadenopathy.  The aorta and its major abdominal branches and great vessel branches are patent.  Negative thoracic inlet.  No acute osseous abnormality identified.  IMPRESSION: 1.  Small to moderate volume of pneumoperitoneum.  Free fluid in both upper quadrant. Likely these relate to the recent colectomy. 2.  Heterogeneous hypodensity in the spleen most suspicious for splenic infarcts in this setting. 3.  Small to moderate layering left pleural effusion with compressive atelectasis.  No pneumothorax, pneumomediastinum, or pneumonia. 4.  Emphysema.  Study discussed by  telephone with Dr. Mauro Kaufmann on 03/20/2012 at 1530 hours.  Original Report Authenticated By: Harley Hallmark, M.D.    ASSESSMENT: This is a very pleasant 56 years old African American male with history of iron deficiency anemia and reactive thrombocytosis. His lab work today showed a stable hemoglobin and hematocrit and acceptable platelets count compared to 6 months ago.  PLAN: I discussed the lab result with the patient today. I recommended for him to continue on observation for now. I would see him back for followup visit in 3 months with repeat CBC and iron study. He was advised to call me immediately if he has any concerning symptoms in the interval.  All questions were answered. The patient knows to call the clinic with any problems, questions or concerns. We can certainly see the patient much sooner if necessary.

## 2012-04-19 ENCOUNTER — Encounter: Payer: Self-pay | Admitting: Internal Medicine

## 2012-04-19 ENCOUNTER — Ambulatory Visit (INDEPENDENT_AMBULATORY_CARE_PROVIDER_SITE_OTHER): Payer: No Typology Code available for payment source | Admitting: Internal Medicine

## 2012-04-19 VITALS — BP 132/84 | HR 92 | Temp 98.3°F | Wt 124.0 lb

## 2012-04-19 DIAGNOSIS — H6120 Impacted cerumen, unspecified ear: Secondary | ICD-10-CM

## 2012-04-19 DIAGNOSIS — I743 Embolism and thrombosis of arteries of the lower extremities: Secondary | ICD-10-CM

## 2012-04-19 NOTE — Patient Instructions (Addendum)
Coumadin 5 mg tablet  plus Coumadin 2.5 mg tablet  every day Come back in 5 days 04-24-12 for a recheck

## 2012-04-19 NOTE — Progress Notes (Signed)
  Subjective:    Patient ID: Rick Edwards, male    DOB: August 28, 1955, 56 y.o.   MRN: [id number]  HPI Here w/ father . Needs a Coumadin check Also he has been using peroxide for cerumen impaction, haas gotten  some wax out of the left one.   Past Medical History: HTN Hearing loss , B, has aL hearing aid CROHNS DISEASE -- see surgeries   03-2012 Splenic infarct, ischemic right foot after colectomy a-2013, on Coumadin, ECHO (-), dx w/ anti-throm def  03-2012 anti-thrombin 3 deficiency. upper GI bleed with normal EGD  6-11, 3 PRBCs   Past Surgical History: rt hemicolectomy 08/29/2001 (perforated abscess) ANAL FISTULOTOMY, ABSCESS 2002 03-2012 total abdominal colectomy with ileostomy do to Crohn's disease 03-2012 popliteal and posterior tibial embolectomy Family History: CAD - no DM - no HTN -- "the whole family" stroke - GF, aunts colon ca - no prostate Ca - no  Social History: not married, no children, lives by himself, his father Rick Edwards helps him [phone]) Alcohol -- denies Tobacco-- rarely   Drug use-no Regular exercise-no, active at work works @ [employer]      Review of Systems No ear pain     Objective:   Physical Exam Very hard wax bilaterally       Assessment & Plan:

## 2012-04-19 NOTE — Assessment & Plan Note (Signed)
INR today 1.6. I went over the instructions with his father and he reports good compliance w/ meds . Plan: Increase Coumadin to 7.5 mg every day. Recheck in 5 days.

## 2012-04-19 NOTE — Assessment & Plan Note (Signed)
After lavage, ears normal, TMs intact

## 2012-04-30 ENCOUNTER — Encounter: Payer: Self-pay | Admitting: Vascular Surgery

## 2012-05-01 ENCOUNTER — Ambulatory Visit: Payer: No Typology Code available for payment source | Admitting: Vascular Surgery

## 2012-05-01 ENCOUNTER — Encounter: Payer: Self-pay | Admitting: Vascular Surgery

## 2012-05-01 ENCOUNTER — Other Ambulatory Visit: Payer: Self-pay

## 2012-05-01 ENCOUNTER — Ambulatory Visit (INDEPENDENT_AMBULATORY_CARE_PROVIDER_SITE_OTHER): Payer: No Typology Code available for payment source | Admitting: Vascular Surgery

## 2012-05-01 VITALS — BP 132/89 | HR 70 | Resp 16 | Ht 69.0 in | Wt 133.0 lb

## 2012-05-01 DIAGNOSIS — I96 Gangrene, not elsewhere classified: Secondary | ICD-10-CM

## 2012-05-01 DIAGNOSIS — I743 Embolism and thrombosis of arteries of the lower extremities: Secondary | ICD-10-CM

## 2012-05-01 DIAGNOSIS — Z48812 Encounter for surgical aftercare following surgery on the circulatory system: Secondary | ICD-10-CM

## 2012-05-01 NOTE — Assessment & Plan Note (Signed)
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 He has undergone right popliteal and tibial peroneal trunk embolectomy with vein patch angioplasty. He has an anti-thrombin 3 deficiency and is on Coumadin. Other study in our office is as noted above and shows normal flow in both feet. Given the risk of infection developing I think it would be best to proceed with formal transmetatarsal amputation on the right. The toes of clearly demarcated. All 5 toes are involved. We have discussed the indications for surgery and the potential complications including but not limited to bleeding, wound healing problems, or the risk of the TMA not healing. All his questions were answered he is agreeable to proceed. His surgery scheduled for 10 for 13. His Coumadin will be stopped 5 days preoperatively. Postoperatively he will need physical therapy and need to learn to walk with a Darko shoe. We'll also need to get him back on his Coumadin postoperatively because of his history of antithrombin III deficiency.

## 2012-05-01 NOTE — Progress Notes (Signed)
[facility]  Patient name: Rick Edwards MRN: [id number] DOB: 17-Aug-1955 Sex: male  CC: gangrene of all 5 toes of the right foot.  HPI: Rick Edwards is a 56 y.o. male who underwent a colectomy for Crohn's disease and postoperatively had an ischemic right foot. He underwent right popliteal and tibial peroneal trunk embolectomy with vein patch angioplasties. 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 He was found have anti-thrombin 3 deficiency and has been on Coumadin. We've been letting the toes auto amputated. All 5 toes have clearly demarcated. He is brought in now for elective formal transmetatarsal amputation. He denies any recent fever or chills. He's had minimal pain associated with the dry gangrene of all 5 toes.  Past Medical History  Diagnosis Date  . Crohn's disease   . Perianal abscess 2001    s/p right hemicolectomy, and drainage of retroperitoneal abcess 2003  . Hypertension   . Hearing loss   . GI bleed 6/11    w/ normal EGD 6/11, 3 PRBCs   . Anemia     anemia thrombocytopenia, saw Hematology 2011, can not r/o myeloproliferative d/c    Family History  Problem Relation Age of Onset  . Coronary artery disease Neg Hx   . Diabetes Neg Hx   . Colon cancer Neg Hx   . Prostate cancer Neg Hx   . Stroke      GF, aunts   . Hypertension      "the whole family"  . Hypertension Mother   . Hyperlipidemia Mother   . Hypertension Father   . Hyperlipidemia Father   . Heart disease Father     SOCIAL HISTORY: History  Substance Use Topics  . Smoking status: Former Smoker -- 0.2 packs/day for 9 years    Types: Cigarettes    Quit date: 05/09/2011  . Smokeless tobacco: Never Used  . Alcohol Use: No    Allergies  Allergen Reactions  . Mesalamine     REACTION: Rash    Current Outpatient Prescriptions  Medication Sig Dispense Refill  . amLODipine (NORVASC) 5 MG tablet Take 5  mg by mouth every morning.      Marland Kitchen azaTHIOprine (IMURAN) 50 MG tablet Take 1 and 1/2 tab      . oxyCODONE-acetaminophen (PERCOCET/ROXICET) 5-325 MG per tablet Take 1 tablet by mouth every 4 (four) hours as needed.      Marland Kitchen spironolactone (ALDACTONE) 25 MG tablet Take 1 tablet (25 mg total) by mouth every morning.  90 tablet  0  . warfarin (COUMADIN) 2.5 MG tablet As directed  30 tablet  0  . warfarin (COUMADIN) 5 MG tablet As directed  30 tablet  0    REVIEW OF SYSTEMS: Arly.Keller ] denotes positive finding; [  ] denotes negative finding CARDIOVASCULAR:  [ ]  chest pain   [ ]  chest pressure   [ ]  palpitations   [ ]  orthopnea   [ ]  dyspnea on exertion   [ ]  claudication   [ ]  rest pain   [ ]  DVT   [ ]  phlebitis PULMONARY:   [ ]  productive cough   [ ]  asthma   [ ]  wheezing NEUROLOGIC:   [ ]  weakness  [ ]  paresthesias  [ ]  aphasia  [ ]  amaurosis  [ ]  dizziness HEMATOLOGIC:   [ ]  bleeding problems anti-thrombin 3 deficiency  Arly.Keller ] clotting disorders: anti-thrombin 3  deficiency MUSCULOSKELETAL:  [ ]  joint pain   [ ]  joint swelling [ ]  leg swelling GASTROINTESTINAL: [ ]   blood in stool  [ ]   hematemesis GENITOURINARY:  [ ]   dysuria  [ ]   hematuria PSYCHIATRIC:  [ ]  history of major depression INTEGUMENTARY:  [ ]  rashes  [ ]  ulcers CONSTITUTIONAL:  [ ]  fever   [ ]  chills  PHYSICAL EXAM: Filed Vitals:   05/01/12 0922  BP: 132/89  Pulse: 70  Resp: 16  Height: 5\' 9"  (1.753 m)  Weight: 133 lb (60.328 kg)  SpO2: 100%   Body mass index is 19.64 kg/(m^2). GENERAL: The patient is a well-nourished male, in no acute distress. The vital signs are documented above. CARDIOVASCULAR: There is a regular rate and rhythm without significant murmur appreciated. I do not detect carotid bruits. He has palpable posterior tibial pulses bilaterally. PULMONARY: There is good air exchange bilaterally without wheezing or rales. ABDOMEN: Soft and non-tender with normal pitched bowel sounds.  MUSCULOSKELETAL: He has dry gangrene  of all 5 toes of the right foot. Currently there is no significant drainage or erythema. There is mild swelling of the right foot. NEUROLOGIC: No focal weakness or paresthesias are detected. PSYCHIATRIC: The patient has a normal affect.  DATA:  I have independently interpreted his arterial Doppler study in our office which shows triphasic Doppler signals in the right dorsalis pedis and posterior tibial positions with an ABI of greater than 100%. On the left side he has triphasic Doppler signals in the dorsalis pedis and posterior tibial positions with an ABI of greater than 100%.  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 He has undergone right popliteal and tibial peroneal trunk embolectomy with vein patch angioplasty. He has an anti-thrombin 3 deficiency and is on Coumadin. Other study in our office is as noted above and shows normal flow in both feet. Given the risk of infection developing I think it would be best to proceed with formal transmetatarsal amputation on the right. The toes of clearly demarcated. All 5 toes are involved. We have discussed the indications for surgery and the potential complications including but not limited to bleeding, wound healing problems, or the risk of the TMA not healing. All his questions were answered he is agreeable to proceed. His surgery scheduled for 10 for 13. His Coumadin will be stopped 5 days preoperatively. Postoperatively he will need physical therapy and need to learn to walk with a Darko shoe. We'll also need to get him back on his Coumadin postoperatively because of his history of antithrombin III deficiency.   Michaella Imai S [facility] Office: [phone]

## 2012-05-01 NOTE — Progress Notes (Signed)
ankle brachial index performed @ [facility] 05/01/2012

## 2012-05-02 ENCOUNTER — Telehealth: Payer: Self-pay | Admitting: Internal Medicine

## 2012-05-02 NOTE — Telephone Encounter (Signed)
Call patient, needs his INR check. He is scheduled to have surgery 05/10/2012, needs to stop Coumadin 5 days ahead of time.

## 2012-05-03 NOTE — Telephone Encounter (Signed)
Spoke with gail, the pt's sister & she stated that the pt has already been told to stop his coumadin. He is stopping it on Saturday evening. Dondra Spry is going to talk to the pt to see if he's getting his PT/INR checked by another Dr or if not then he will come in on Monday. Dondra Spry stated that she will call back to let us know one way or the other.

## 2012-05-06 ENCOUNTER — Ambulatory Visit (INDEPENDENT_AMBULATORY_CARE_PROVIDER_SITE_OTHER): Payer: No Typology Code available for payment source | Admitting: *Deleted

## 2012-05-06 ENCOUNTER — Encounter (HOSPITAL_COMMUNITY): Payer: Self-pay

## 2012-05-06 ENCOUNTER — Encounter (HOSPITAL_COMMUNITY)
Admission: RE | Admit: 2012-05-06 | Discharge: 2012-05-06 | Disposition: A | Discharge status: Home / Self Care | Payer: No Typology Code available for payment source | Source: Ambulatory Visit | Attending: Vascular Surgery | Admitting: Vascular Surgery

## 2012-05-06 VITALS — BP 108/72 | HR 79

## 2012-05-06 DIAGNOSIS — I743 Embolism and thrombosis of arteries of the lower extremities: Secondary | ICD-10-CM

## 2012-05-06 DIAGNOSIS — Z7901 Long term (current) use of anticoagulants: Secondary | ICD-10-CM

## 2012-05-06 LAB — POCT I-STAT, CHEM 8
Creatinine, Ser: 0.8 mg/dL (ref 0.50–1.35)
Glucose, Bld: 95 mg/dL (ref 70–99)
Hemoglobin: 13.3 g/dL (ref 13.0–17.0)
Potassium: 3.6 mEq/L (ref 3.5–5.1)
TCO2: 24 mmol/L (ref 0–100)

## 2012-05-06 LAB — SURGICAL PCR SCREEN: MRSA, PCR: NEGATIVE

## 2012-05-06 NOTE — Telephone Encounter (Signed)
Pt has an appt today @ 3pm. 

## 2012-05-06 NOTE — Patient Instructions (Addendum)
INR 1.3, he already started Coumadin. Plan:  Restart Coumadin as soon as surgery recommend, office visit here is 3 days after Coumadin is restarted JP

## 2012-05-06 NOTE — Pre-Procedure Instructions (Signed)
20 Rick Edwards   05/06/2012    Your procedure is scheduled on: October 4th, Friday   Report to [facility] at  5:30 AM.  Call this number if you have problems the morning of surgery: [phone]   Remember:   Do not eat food or drink any liquids:After Midnight Thursday .  Take these medicines the morning of surgery with A SIP OF WATER: Norvasc, Oxycodone   Do not wear jewelry.  Do not wear lotions, powders, or perfumes. You may NOT wear deodorant.   Men may shave face and neck.   Do not bring valuables to the hospital.  Contacts, dentures or bridgework may not be worn into surgery.   Leave suitcase in the car. After surgery it may be brought to your room.  For patients admitted to the hospital, checkout time is 11:00 AM the day of discharge.   Patients discharged the day of surgery will not be allowed to drive home.  Someone will need to stay with you for the first 24 hrs.    Name and phone number of your driver:  Special Instructions: Shower using CHG 2 nights before surgery and the night before surgery.  If you shower the day of surgery use CHG.  Use special wash - you have one bottle of CHG for all showers.  You should use approximately 1/3 of the bottle for each shower.   Please read over the following fact sheets that you were given: Pain Booklet, Coughing and Deep Breathing, MRSA Information and Surgical Site Infection Prevention

## 2012-05-09 MED ORDER — CEFAZOLIN SODIUM-DEXTROSE 2-3 GM-% IV SOLR
2.0000 g | INTRAVENOUS | Status: AC
  Administered 2012-05-10: 2 g via INTRAVENOUS
  Filled 2012-05-09: qty 50

## 2012-05-10 ENCOUNTER — Encounter (HOSPITAL_COMMUNITY): Payer: Self-pay | Admitting: Anesthesiology

## 2012-05-10 ENCOUNTER — Encounter (HOSPITAL_COMMUNITY)
Admission: RE | Disposition: A | Discharge status: Home / Self Care | Payer: Self-pay | Source: Ambulatory Visit | Attending: Vascular Surgery

## 2012-05-10 ENCOUNTER — Encounter (HOSPITAL_COMMUNITY): Payer: Self-pay | Admitting: *Deleted

## 2012-05-10 ENCOUNTER — Ambulatory Visit (HOSPITAL_COMMUNITY): Payer: No Typology Code available for payment source | Admitting: Anesthesiology

## 2012-05-10 ENCOUNTER — Inpatient Hospital Stay (HOSPITAL_COMMUNITY)
Admission: RE | Admit: 2012-05-10 | Discharge: 2012-05-16 | DRG: 240 | Disposition: A | Discharge status: Home / Self Care | Payer: No Typology Code available for payment source | Source: Ambulatory Visit | Attending: Vascular Surgery | Admitting: Vascular Surgery

## 2012-05-10 ENCOUNTER — Ambulatory Visit (HOSPITAL_COMMUNITY): Payer: No Typology Code available for payment source

## 2012-05-10 DIAGNOSIS — H9190 Unspecified hearing loss, unspecified ear: Secondary | ICD-10-CM | POA: Diagnosis present

## 2012-05-10 DIAGNOSIS — D68318 Other hemorrhagic disorder due to intrinsic circulating anticoagulants, antibodies, or inhibitors: Secondary | ICD-10-CM | POA: Diagnosis present

## 2012-05-10 DIAGNOSIS — I70269 Atherosclerosis of native arteries of extremities with gangrene, unspecified extremity: Secondary | ICD-10-CM

## 2012-05-10 DIAGNOSIS — Z7901 Long term (current) use of anticoagulants: Secondary | ICD-10-CM

## 2012-05-10 DIAGNOSIS — I10 Essential (primary) hypertension: Secondary | ICD-10-CM | POA: Diagnosis present

## 2012-05-10 DIAGNOSIS — I739 Peripheral vascular disease, unspecified: Secondary | ICD-10-CM

## 2012-05-10 DIAGNOSIS — K5090 Crohn's disease, unspecified, without complications: Secondary | ICD-10-CM | POA: Diagnosis present

## 2012-05-10 HISTORY — PX: TRANSMETATARSAL AMPUTATION: SHX6197

## 2012-05-10 LAB — PROTIME-INR
INR: 1.06 (ref 0.00–1.49)
Prothrombin Time: 13.7 seconds (ref 11.6–15.2)

## 2012-05-10 LAB — CBC
MCH: 29 pg (ref 26.0–34.0)
MCHC: 32.8 g/dL (ref 30.0–36.0)
Platelets: 319 10*3/uL (ref 150–400)
RBC: 4.1 MIL/uL — ABNORMAL LOW (ref 4.22–5.81)

## 2012-05-10 SURGERY — AMPUTATION, FOOT, TRANSMETATARSAL
Anesthesia: Monitor Anesthesia Care | Site: Foot | Laterality: Right | Wound class: Clean

## 2012-05-10 MED ORDER — SPIRONOLACTONE 25 MG PO TABS
25.0000 mg | ORAL_TABLET | Freq: Every morning | ORAL | Status: DC
  Administered 2012-05-10 – 2012-05-16 (×7): 25 mg via ORAL
  Filled 2012-05-10 (×7): qty 1

## 2012-05-10 MED ORDER — PROPOFOL INFUSION 10 MG/ML OPTIME
INTRAVENOUS | Status: DC | PRN
  Administered 2012-05-10: 25 ug/kg/min via INTRAVENOUS

## 2012-05-10 MED ORDER — AMLODIPINE BESYLATE 5 MG PO TABS
5.0000 mg | ORAL_TABLET | Freq: Every morning | ORAL | Status: DC
  Administered 2012-05-10 – 2012-05-16 (×7): 5 mg via ORAL
  Filled 2012-05-10 (×7): qty 1

## 2012-05-10 MED ORDER — MIDAZOLAM HCL 2 MG/2ML IJ SOLN
0.5000 mg | Freq: Once | INTRAMUSCULAR | Status: DC | PRN
Start: 2012-05-10 — End: 2012-05-10

## 2012-05-10 MED ORDER — ONDANSETRON HCL 4 MG/2ML IJ SOLN: 4.0000 mg | Freq: Four times a day (QID) | INTRAMUSCULAR | Status: DC | PRN

## 2012-05-10 MED ORDER — LABETALOL HCL 5 MG/ML IV SOLN
10.0000 mg | INTRAVENOUS | Status: DC | PRN
  Filled 2012-05-10: qty 4

## 2012-05-10 MED ORDER — BACITRACIN ZINC 500 UNIT/GM EX OINT
TOPICAL_OINTMENT | CUTANEOUS | Status: DC | PRN
  Administered 2012-05-10: 1 via TOPICAL

## 2012-05-10 MED ORDER — MORPHINE SULFATE 4 MG/ML IJ SOLN
3.0000 mg | INTRAMUSCULAR | Status: DC | PRN
  Administered 2012-05-10 – 2012-05-11 (×5): 4 mg via INTRAVENOUS
  Filled 2012-05-10 (×4): qty 1

## 2012-05-10 MED ORDER — WHITE PETROLATUM GEL
Status: DC | PRN
  Filled 2012-05-10: qty 5

## 2012-05-10 MED ORDER — POTASSIUM CHLORIDE IN NACL 20-0.9 MEQ/L-% IV SOLN
INTRAVENOUS | Status: DC
  Administered 2012-05-10 – 2012-05-11 (×2): via INTRAVENOUS
  Administered 2012-05-12: 50 mL/h via INTRAVENOUS
  Filled 2012-05-10 (×4): qty 1000

## 2012-05-10 MED ORDER — LACTATED RINGERS IV SOLN
INTRAVENOUS | Status: DC | PRN
  Administered 2012-05-10 (×2): via INTRAVENOUS

## 2012-05-10 MED ORDER — DIPHENHYDRAMINE HCL 25 MG PO CAPS
50.0000 mg | ORAL_CAPSULE | Freq: Four times a day (QID) | ORAL | Status: DC | PRN
  Administered 2012-05-10: 25 mg via ORAL

## 2012-05-10 MED ORDER — ONDANSETRON HCL 4 MG/2ML IJ SOLN
INTRAMUSCULAR | Status: DC | PRN
  Administered 2012-05-10: 4 mg via INTRAVENOUS

## 2012-05-10 MED ORDER — PROMETHAZINE HCL 25 MG/ML IJ SOLN: 6.2500 mg | INTRAMUSCULAR | Status: DC | PRN

## 2012-05-10 MED ORDER — POTASSIUM CHLORIDE CRYS ER 20 MEQ PO TBCR
20.0000 meq | EXTENDED_RELEASE_TABLET | Freq: Once | ORAL | Status: AC
  Administered 2012-05-10: 40 meq via ORAL
  Filled 2012-05-10: qty 2

## 2012-05-10 MED ORDER — HYDROMORPHONE HCL PF 1 MG/ML IJ SOLN
0.5000 mg | INTRAMUSCULAR | Status: DC | PRN
  Administered 2012-05-10: 1 mg via INTRAVENOUS
  Filled 2012-05-10: qty 1

## 2012-05-10 MED ORDER — SODIUM CHLORIDE 0.9 % IV SOLN: INTRAVENOUS | Status: DC

## 2012-05-10 MED ORDER — ENOXAPARIN SODIUM 60 MG/0.6ML ~~LOC~~ SOLN
60.0000 mg | Freq: Two times a day (BID) | SUBCUTANEOUS | Status: DC
  Administered 2012-05-10 – 2012-05-15 (×12): 60 mg via SUBCUTANEOUS
  Filled 2012-05-10 (×15): qty 0.6

## 2012-05-10 MED ORDER — DIPHENHYDRAMINE HCL 25 MG PO CAPS
25.0000 mg | ORAL_CAPSULE | Freq: Four times a day (QID) | ORAL | Status: DC | PRN
  Administered 2012-05-10: 25 mg via ORAL
  Filled 2012-05-10 (×2): qty 1

## 2012-05-10 MED ORDER — GUAIFENESIN-DM 100-10 MG/5ML PO SYRP: 15.0000 mL | ORAL_SOLUTION | ORAL | Status: DC | PRN

## 2012-05-10 MED ORDER — AZATHIOPRINE 50 MG PO TABS
50.0000 mg | ORAL_TABLET | Freq: Every day | ORAL | Status: DC
  Administered 2012-05-10 – 2012-05-16 (×7): 50 mg via ORAL
  Filled 2012-05-10 (×7): qty 1

## 2012-05-10 MED ORDER — WARFARIN - PHYSICIAN DOSING INPATIENT: Freq: Every day | Status: DC

## 2012-05-10 MED ORDER — MORPHINE SULFATE 4 MG/ML IJ SOLN
INTRAMUSCULAR | Status: AC
  Administered 2012-05-10: 4 mg via INTRAVENOUS
  Filled 2012-05-10: qty 1

## 2012-05-10 MED ORDER — FENTANYL CITRATE 0.05 MG/ML IJ SOLN
INTRAMUSCULAR | Status: AC
  Filled 2012-05-10: qty 2

## 2012-05-10 MED ORDER — HYDRALAZINE HCL 20 MG/ML IJ SOLN
10.0000 mg | INTRAMUSCULAR | Status: DC | PRN
  Filled 2012-05-10: qty 0.5

## 2012-05-10 MED ORDER — HYDROMORPHONE HCL PF 1 MG/ML IJ SOLN
0.2500 mg | INTRAMUSCULAR | Status: DC | PRN
  Administered 2012-05-10 (×2): 0.5 mg via INTRAVENOUS

## 2012-05-10 MED ORDER — 0.9 % SODIUM CHLORIDE (POUR BTL) OPTIME
TOPICAL | Status: DC | PRN
  Administered 2012-05-10: 1000 mL

## 2012-05-10 MED ORDER — PANTOPRAZOLE SODIUM 40 MG PO TBEC
40.0000 mg | DELAYED_RELEASE_TABLET | Freq: Every day | ORAL | Status: DC
  Administered 2012-05-10 – 2012-05-16 (×7): 40 mg via ORAL
  Filled 2012-05-10 (×6): qty 1

## 2012-05-10 MED ORDER — WARFARIN SODIUM 2.5 MG PO TABS
2.5000 mg | ORAL_TABLET | Freq: Every day | ORAL | Status: DC
  Filled 2012-05-10: qty 1

## 2012-05-10 MED ORDER — MIDAZOLAM HCL 2 MG/2ML IJ SOLN
INTRAMUSCULAR | Status: AC
  Filled 2012-05-10: qty 2

## 2012-05-10 MED ORDER — BACITRACIN ZINC 500 UNIT/GM EX OINT
TOPICAL_OINTMENT | CUTANEOUS | Status: AC
  Filled 2012-05-10: qty 15

## 2012-05-10 MED ORDER — OXYCODONE-ACETAMINOPHEN 5-325 MG PO TABS
1.0000 | ORAL_TABLET | ORAL | Status: DC | PRN
  Administered 2012-05-10 – 2012-05-11 (×3): 1 via ORAL
  Filled 2012-05-10 (×4): qty 1

## 2012-05-10 MED ORDER — FENTANYL CITRATE 0.05 MG/ML IJ SOLN
INTRAMUSCULAR | Status: DC | PRN
  Administered 2012-05-10: 50 ug via INTRAVENOUS
  Administered 2012-05-10: 100 ug via INTRAVENOUS
  Administered 2012-05-10 (×2): 50 ug via INTRAVENOUS
  Administered 2012-05-10: 100 ug via INTRAVENOUS

## 2012-05-10 MED ORDER — MEPERIDINE HCL 25 MG/ML IJ SOLN: 6.2500 mg | INTRAMUSCULAR | Status: DC | PRN

## 2012-05-10 MED ORDER — MIDAZOLAM HCL 5 MG/5ML IJ SOLN
INTRAMUSCULAR | Status: DC | PRN
  Administered 2012-05-10: 2 mg via INTRAVENOUS

## 2012-05-10 MED ORDER — ALUM & MAG HYDROXIDE-SIMETH 200-200-20 MG/5ML PO SUSP: 15.0000 mL | ORAL | Status: DC | PRN

## 2012-05-10 MED ORDER — PHENOL 1.4 % MT LIQD
1.0000 | OROMUCOSAL | Status: DC | PRN
  Filled 2012-05-10: qty 177

## 2012-05-10 MED ORDER — METOPROLOL TARTRATE 1 MG/ML IV SOLN: 2.0000 mg | INTRAVENOUS | Status: DC | PRN

## 2012-05-10 MED ORDER — BUPIVACAINE HCL (PF) 0.5 % IJ SOLN
INTRAMUSCULAR | Status: DC | PRN
  Administered 2012-05-10: 40 mL

## 2012-05-10 SURGICAL SUPPLY — 44 items
BANDAGE CONFORM 3  STR LF (GAUZE/BANDAGES/DRESSINGS) ×1 IMPLANT
BANDAGE ELASTIC 4 VELCRO ST LF (GAUZE/BANDAGES/DRESSINGS) ×2 IMPLANT
BANDAGE GAUZE ELAST BULKY 4 IN (GAUZE/BANDAGES/DRESSINGS) ×2 IMPLANT
BLADE AVERAGE 25X9 (BLADE) ×2 IMPLANT
CANISTER SUCTION 2500CC (MISCELLANEOUS) ×2 IMPLANT
CLOTH BEACON ORANGE TIMEOUT ST (SAFETY) ×2 IMPLANT
COVER SURGICAL LIGHT HANDLE (MISCELLANEOUS) ×2 IMPLANT
DRAPE EXTREMITY T 121X128X90 (DRAPE) ×2 IMPLANT
DRSG ADAPTIC 3X8 NADH LF (GAUZE/BANDAGES/DRESSINGS) ×2 IMPLANT
ELECT REM PT RETURN 9FT ADLT (ELECTROSURGICAL) ×2
ELECTRODE REM PT RTRN 9FT ADLT (ELECTROSURGICAL) ×1 IMPLANT
GLOVE BIO SURGEON STRL SZ7.5 (GLOVE) ×2 IMPLANT
GLOVE BIOGEL PI IND STRL 6 (GLOVE) ×1 IMPLANT
GLOVE BIOGEL PI IND STRL 6.5 (GLOVE) IMPLANT
GLOVE BIOGEL PI IND STRL 7.5 (GLOVE) IMPLANT
GLOVE BIOGEL PI IND STRL 8 (GLOVE) ×1 IMPLANT
GLOVE BIOGEL PI INDICATOR 6 (GLOVE) ×1
GLOVE BIOGEL PI INDICATOR 6.5 (GLOVE) ×1
GLOVE BIOGEL PI INDICATOR 7.5 (GLOVE) ×1
GLOVE BIOGEL PI INDICATOR 8 (GLOVE) ×1
GLOVE SURG SS PI 7.5 STRL IVOR (GLOVE) ×1 IMPLANT
GOWN PREVENTION PLUS XLARGE (GOWN DISPOSABLE) ×1 IMPLANT
GOWN STRL NON-REIN LRG LVL3 (GOWN DISPOSABLE) ×3 IMPLANT
KIT BASIN OR (CUSTOM PROCEDURE TRAY) ×2 IMPLANT
KIT ROOM TURNOVER OR (KITS) ×2 IMPLANT
NS IRRIG 1000ML POUR BTL (IV SOLUTION) ×2 IMPLANT
PACK GENERAL/GYN (CUSTOM PROCEDURE TRAY) ×2 IMPLANT
PAD ARMBOARD 7.5X6 YLW CONV (MISCELLANEOUS) ×4 IMPLANT
SPECIMEN JAR MEDIUM (MISCELLANEOUS) ×2 IMPLANT
SPONGE GAUZE 4X4 12PLY (GAUZE/BANDAGES/DRESSINGS) ×2 IMPLANT
STAPLER VISISTAT 35W (STAPLE) ×1 IMPLANT
SUT ETHILON 3 0 PS 1 (SUTURE) ×2 IMPLANT
SUT SILK 2 0 (SUTURE) ×2
SUT SILK 2-0 18XBRD TIE 12 (SUTURE) IMPLANT
SUT SILK 3 0 (SUTURE) ×2
SUT SILK 3-0 18XBRD TIE 12 (SUTURE) IMPLANT
SUT VIC AB 3-0 SH 27 (SUTURE) ×4
SUT VIC AB 3-0 SH 27X BRD (SUTURE) IMPLANT
SWAB COLLECTION DEVICE MRSA (MISCELLANEOUS) IMPLANT
TOWEL OR 17X24 6PK STRL BLUE (TOWEL DISPOSABLE) ×2 IMPLANT
TOWEL OR 17X26 10 PK STRL BLUE (TOWEL DISPOSABLE) ×2 IMPLANT
TUBE ANAEROBIC SPECIMEN COL (MISCELLANEOUS) IMPLANT
UNDERPAD 30X30 INCONTINENT (UNDERPADS AND DIAPERS) ×2 IMPLANT
WATER STERILE IRR 1000ML POUR (IV SOLUTION) ×2 IMPLANT

## 2012-05-10 NOTE — Anesthesia Postprocedure Evaluation (Signed)
  Anesthesia Post-op Note  Patient: Rick Edwards  Procedure(s) Performed: Procedure(s) (LRB) with comments: TRANSMETATARSAL AMPUTATION (Right) - Anesthesia- Ankle Block and Monitored Anesthesia Care  Patient Location: PACU  Anesthesia Type: MAC combined with regional for post-op pain  Level of Consciousness: awake, alert , oriented and patient cooperative  Airway and Oxygen Therapy: Patient Spontanous Breathing  Post-op Pain: none  Post-op Assessment: Post-op Vital signs reviewed, Patient's Cardiovascular Status Stable, Respiratory Function Stable, Patent Airway, No signs of Nausea or vomiting and Pain level controlled  Post-op Vital Signs: Reviewed and stable  Complications: No apparent anesthesia complications

## 2012-05-10 NOTE — Progress Notes (Signed)
Patients daughter expressed concern that last time patient was in the hospital he felt pain in his foot and had a blood clot.  Daughter wanted to see if care team could perform a scan of leg to determine if DVT was present.   I assessed patients foot and determined pain was coming from surgery and not leg

## 2012-05-10 NOTE — Anesthesia Preprocedure Evaluation (Addendum)
Anesthesia Evaluation  Patient identified by MRN, date of birth, ID band Patient awake    Reviewed: Allergy & Precautions, H&P , NPO status , Patient's Chart, lab work & pertinent test results  History of Anesthesia Complications Negative for: history of anesthetic complications  Airway Mallampati: II TM Distance: >3 FB Neck ROM: Full    Dental  (+) Poor Dentition, Missing, Dental Advisory Given and Chipped   Pulmonary former smoker,  breath sounds clear to auscultation  Pulmonary exam normal       Cardiovascular hypertension, Pt. on medications + Peripheral Vascular Disease (s/p femoral embolectomy, off coumadin x 1 week, INR 1.3 9/30) Rhythm:Regular Rate:Normal  8/13 ECHO: normal LVF, valves OK   Neuro/Psych negative neurological ROS     GI/Hepatic Neg liver ROS, Crohn's: s/p resection   Endo/Other  negative endocrine ROS  Renal/GU negative Renal ROS     Musculoskeletal   Abdominal   Peds  Hematology   Anesthesia Other Findings   Reproductive/Obstetrics                          Anesthesia Physical Anesthesia Plan  ASA: III  Anesthesia Plan: MAC and Regional   Post-op Pain Management:    Induction: Intravenous  Airway Management Planned: Simple Face Mask  Additional Equipment:   Intra-op Plan:   Post-operative Plan:   Informed Consent: I have reviewed the patients History and Physical, chart, labs and discussed the procedure including the risks, benefits and alternatives for the proposed anesthesia with the patient or authorized representative who has indicated his/her understanding and acceptance.   Dental advisory given  Plan Discussed with: CRNA and Surgeon  Anesthesia Plan Comments: (Plan routine monitors, Ankle block with MAC)        Anesthesia Quick Evaluation

## 2012-05-10 NOTE — Transfer of Care (Signed)
Immediate Anesthesia Transfer of Care Note  Patient: Rick Edwards  Procedure(s) Performed: Procedure(s) (LRB) with comments: TRANSMETATARSAL AMPUTATION (Right) - Anesthesia- Ankle Block and Monitored Anesthesia Care   Patient Location: PACU  Anesthesia Type: MAC and Regional  Level of Consciousness: awake, alert , oriented and sedated  Airway & Oxygen Therapy: Patient Spontanous Breathing and Patient connected to nasal cannula oxygen  Post-op Assessment: Report given to PACU RN, Post -op Vital signs reviewed and stable and Patient moving all extremities  Post vital signs: Reviewed and stable  Complications: No apparent anesthesia complications

## 2012-05-10 NOTE — Anesthesia Procedure Notes (Addendum)
Anesthesia Regional Block:  Ankle block  Pre-Anesthetic Checklist: ,, timeout performed, Correct Patient, Correct Site, Correct Laterality, Correct Procedure, Correct Position, site marked, Risks and benefits discussed,  Surgical consent,  Pre-op evaluation,  At surgeon's request and post-op pain management  Laterality: Right  Prep: chloraprep       Needles:  Injection technique: Single-shot     Needle Length: 4cm  Needle Gauge: 22 and 22 G    Additional Needles:  Procedures: other  (simple quincke, perineural field block) Ankle block Narrative:  Start time: 05/10/2012 7:19 AM End time: 05/10/2012 7:25 AM Injection made incrementally with aspirations every 5 mL.  Performed by: Personally  Anesthesiologist: Sandford Craze, MD  Additional Notes: Pt identified in Holding room.  Monitors applied. Working IV access confirmed. Sterile prep R ankle.  #[id number] perineural infiltration of local around deep and sup peroneal, saph, post tib, and sural nerves. Total 40cc 0.5% Bupivacaine injected incrementally.  Patient asymptomatic, VSS, no heme aspirated, tolerated well.   Sandford Craze, MD  Ankle block Procedure Name: MAC Date/Time: 05/10/2012 7:45 AM Performed by: Fransisca Kaufmann Pre-anesthesia Checklist: Patient identified, Timeout performed, Emergency Drugs available, Suction available and Patient being monitored Patient Re-evaluated:Patient Re-evaluated prior to inductionOxygen Delivery Method: Simple face mask Preoxygenation: Pre-oxygenation with 100% oxygen Intubation Type: IV induction

## 2012-05-10 NOTE — H&P (View-Only) (Signed)
[facility] [place]  Patient name: Rick Edwards MRN: [id number] DOB: 03/21/1956 Sex: male  REASON FOR VISIT: follow up of dry gangrene of the toes of the right foot.  HPI: Deverick W Antosh is a 55 y.o. male who underwent a colectomy for Crohn's disease and postoperatively was noted to have an ischemic right foot. He underwent right popliteal and tibial peroneal trunk embolectomy with vein patch angioplasty and also posterior tibial artery embolectomy and vein patch angioplasty. 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 He has developed dry gangrene of the toes of the right foot which we have been following. He comes in for outpatient visit. He has noted minimal drainage from the right foot wound. He denies any fever. He said minimal pain in the right foot.   REVIEW OF SYSTEMS: [X ] denotes positive finding; [  ] denotes negative finding  CARDIOVASCULAR:  [ ] chest pain   [ ] dyspnea on exertion    CONSTITUTIONAL:  [ ] fever   [ ] chills  PHYSICAL EXAM: Filed Vitals:   04/10/12 1345  BP: 151/100  Pulse: 106  Temp: 98.5 F (36.9 C)  TempSrc: Oral  Resp: 20  Height: 5' 9" (1.753 m)  Weight: 120 lb (54.432 kg)  SpO2: 99%   Body mass index is 17.72 kg/(m^2). GENERAL: The patient is a well-nourished male, in no acute distress. The vital signs are documented above. CARDIOVASCULAR: There is a regular rate and rhythm  PULMONARY: There is good air exchange bilaterally without wheezing or rales. He has a palpable right femoral, right popliteal, and right posterior tibial pulse. He has a biphasic dorsalis pedis signal and a biphasic posterior tibial signal on the right. He has dry gangrene of all toes of the right foot. His right leg incision is healed.  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 He has a palpable femoral,  popliteal, and posterior tibial pulse on the right with a biphasic dorsalis pedis and posterior tibial signal. He would prefer to allow the toes to auto amputate and so is doing daily lukewarm dials soaked soaks and keeping the toes dry. We have also discussed the option of proceeding with formal transmetatarsal amputation however is reluctant to proceed at this time. He has an antithrombin 3 deficiency and is on Coumadin. I'll see him back in 3 weeks. He knows to call sooner if he has problems.   Kierra Jezewski S [facility] [place] Beeper: [phone]     

## 2012-05-10 NOTE — Interval H&P Note (Signed)
History and Physical Interval Note:  05/10/2012 6:59 AM  Rick Edwards  has presented today for surgery, with the diagnosis of Dry Gangrene toes (R) Foot  The various methods of treatment have been discussed with the patient and family. After consideration of risks, benefits and other options for treatment, the patient has consented to  Procedure(s) (LRB) with comments: TRANSMETATARSAL AMPUTATION (Right) - Anesthesia- Ankle Block or General as a surgical intervention .  The patient's history has been reviewed, patient examined, no change in status, stable for surgery.  I have reviewed the patient's chart and labs.  Questions were answered to the patient's satisfaction.     Shandie Bertz S

## 2012-05-10 NOTE — Progress Notes (Addendum)
ANTICOAGULATION CONSULT NOTE - Initial Consult  Pharmacy Consult for Lovenox / Coumadin per MD Indication: history of AT3 deficiency  Allergies  Allergen Reactions  . Mesalamine     REACTION: Rash    Patient Measurements: Weight = 61 kg  Labs: No results found for this basename: HGB:2,HCT:3,PLT:3,APTT:3,LABPROT:3,INR:3,HEPARINUNFRC:3,CREATININE:3,CKTOTAL:3,CKMB:3,TROPONINI:3 in the last 72 hours  The CrCl is unknown because both a height and weight (above a minimum accepted value) are required for this calculation.   Medical History: Past Medical History  Diagnosis Date  . Crohn's disease   . Perianal abscess 2001    s/p right hemicolectomy, and drainage of retroperitoneal abcess 2003  . Hypertension   . Hearing loss   . GI bleed 6/11    w/ normal EGD 6/11, 3 PRBCs   . Anemia     anemia thrombocytopenia, saw Hematology 2011, can not r/o myeloproliferative d/c    Assessment: 56 year old on Coumadin prior to admission admitted with dry gangrene of all toes of his right foot now s/p amputation  Resuming anti-coagulation for antithrombin III deficiency  Goal of Therapy:  INR 2-3 Monitor platelets by anticoagulation protocol: Yes Appropriate Lovenox dosing   Plan:  1) Lovenox 60 mg sq Q 12 hours 2) Coumadin 2.5 mg po daily per MD 3) Follow up INR, CBC, plan  Thank you. Okey Regal, PharmD [phone]  05/10/2012,9:53 AM

## 2012-05-10 NOTE — Preoperative (Signed)
Beta Blockers   Reason not to administer Beta Blockers:Not Applicable 

## 2012-05-10 NOTE — Progress Notes (Signed)
Patient complaining of skin itching, MD paged, returned call at 1025, prescribed Benadryl 25 mg PO with readback

## 2012-05-10 NOTE — Progress Notes (Signed)
Physical Therapy Note: order noted postop however beginning date of order is for 10/7. Please revise to 10/4 or 10/5 to being mobility and ambulation post of day 1. Thanks Delaney Meigs, PT [phone]

## 2012-05-10 NOTE — Op Note (Signed)
NAME: Rick Edwards   MRN: [id number] DOB: 01-14-1956    DATE OF OPERATION: 05/10/2012  PREOP DIAGNOSIS: Dry gangrene of all toes of the right foot  POSTOP DIAGNOSIS: Same  PROCEDURE: Right transmetatarsal amputation  SURGEON: Di Kindle. Edilia Bo, MD, FACS  ASSIST: none  ANESTHESIA: ankle block   EBL: 100 cc  INDICATIONS: [name] is a 57 y.o. male who underwent a right popliteal and tibial embolectomy with vein patch angioplasty for and the acutely ischemic right leg. He developed dry gangrene of his toes despite excellent Doppler flow to the foot. He has a history of an anti-thrombin 3 deficiency and is on Coumadin. He is brought in for elective transmetatarsal amputation.  FINDINGS: Excellent bleeding. No signs of infection  TECHNIQUE: The patient was brought to the operating room after an ankle block was placed by anesthesia. The right foot was prepped and draped in usual sterile fashion. An incision was made on the dorsum of the foot just proximal to the metatarsal heads. On the plantar aspect of the foot the incision extended or around the base of the toes. The dissection was carried down to the metatarsals which were elevated and then divided with a TPS. I attempted to angulate the cut on the metatarsals approximately 30 in the plantar direction to allow for better lift off. Using sharp dissection the forefoot was amputated preserving the posterior flap. Hemostasis was obtained using electrocautery. The tendons were retracted into the wound and divided. The wound is irrigated with copious amounts of saline. The deep layer was closed with interrupted 3-0 Vicryl. The skin was closed with staples. Sterile dressing was applied. The patient tolerated the procedure well and was transferred to the recovery in stable condition. All needle and sponge counts were correct.  Waverly Ferrari, MD, FACS [facility]  DATE OF DICTATION:    05/10/2012

## 2012-05-10 NOTE — Consult Note (Signed)
WOC requested assistance with dressing orders for midline open abdominal wound.  Pt has had colectomy with end ileostomy in September and has still partially open midline wound.  The bedside nurse reports this area to be clean and draining only minimally.  Pt was dressing with dry gauze at home.  Due to lack of drainage will order hydrogel daily for moist wound healing and dry dressing.  Orders written Re consult if needed, will not follow at this time. Thanks  Alaynna Kerwood [employer], CWOCN [phone])

## 2012-05-10 NOTE — Progress Notes (Signed)
Utilization Review Completed.Rick Edwards T10/11/2011

## 2012-05-11 LAB — PROTIME-INR
INR: 1.07 (ref 0.00–1.49)
Prothrombin Time: 13.8 seconds (ref 11.6–15.2)

## 2012-05-11 MED ORDER — WARFARIN SODIUM 10 MG PO TABS
10.0000 mg | ORAL_TABLET | Freq: Once | ORAL | Status: AC
  Administered 2012-05-11: 10 mg via ORAL
  Filled 2012-05-11 (×2): qty 1

## 2012-05-11 MED ORDER — HYDROMORPHONE HCL PF 1 MG/ML IJ SOLN
0.5000 mg | INTRAMUSCULAR | Status: DC | PRN
  Administered 2012-05-11 – 2012-05-15 (×4): 0.5 mg via INTRAVENOUS
  Filled 2012-05-11 (×4): qty 1

## 2012-05-11 MED ORDER — OXYCODONE-ACETAMINOPHEN 5-325 MG PO TABS
2.0000 | ORAL_TABLET | ORAL | Status: DC | PRN
  Administered 2012-05-11 – 2012-05-15 (×13): 2 via ORAL
  Filled 2012-05-11 (×15): qty 2

## 2012-05-11 MED ORDER — WARFARIN - PHARMACIST DOSING INPATIENT
Freq: Every day | Status: DC
  Administered 2012-05-15: 17:00:00

## 2012-05-11 MED ORDER — WARFARIN SODIUM 7.5 MG PO TABS
7.5000 mg | ORAL_TABLET | Freq: Once | ORAL | Status: DC
  Filled 2012-05-11: qty 1

## 2012-05-11 NOTE — Progress Notes (Addendum)
ANTICOAGULATION CONSULT NOTE - Initial Consult  Pharmacy Consult for Lovenox per Pharmacist protocol / Coumadin -changed to per pharmacy protocol per Dr. Imogene Burn Indication: history of AT3 deficiency  Allergies  Allergen Reactions  . Mesalamine     REACTION: Rash    Patient Measurements: Weight = 61 kg  Labs:  Basename 05/11/12 0630 05/10/12 1050  HGB -- 11.9*  HCT -- 36.3*  PLT -- 319  APTT -- --  LABPROT 13.8 13.7  INR 1.07 1.06  HEPARINUNFRC -- --  CREATININE -- --  CKTOTAL -- --  CKMB -- --  TROPONINI -- --    Estimated Creatinine Clearance: 92.3 ml/min (by C-G formula based on Cr of 0.8).   Medical History: Past Medical History  Diagnosis Date  . Crohn's disease   . Perianal abscess 2001    s/p right hemicolectomy, and drainage of retroperitoneal abcess 2003  . Hypertension   . Hearing loss   . GI bleed 6/11    w/ normal EGD 6/11, 3 PRBCs   . Anemia     anemia thrombocytopenia, saw Hematology 2011, can not r/o myeloproliferative d/c    Assessment: INR = 1.40 in this 56 year old male on Coumadin prior to admission  for antithrombin III deficiency (03-2012).  POD#1 s/p Right transmetatarsal amputation.  Lovenox bridge to coumadin. INR is subtherapeutic. Home dose of Coumadin was  last taken on 9/28 PTA.  MD order post op for Coumadin 2.5mg  daily to start today 10/5 (none given on 10/5).  Lovenox 60mg  SQ q12h started 05/10/12.  CrCl ~ 46ml/min. No CBC today. No bleeding reported. H/o upper GI bleed with normal EGD 6/11, 3 PRBCs.   Goal of Therapy:  INR 2-3 Monitor platelets by anticoagulation protocol: Yes Appropriate Lovenox dosing   Plan:  1) Continue Lovenox 60 mg sq Q 12 hours 2) MD dosing Coumadin 2.5 mg po daily.  3) Follow up INR, CBC, plan  Noah Delaine, RPh Clinical Pharmacist Pager: [phone] 05/11/2012,12:16 PM  Addendum:  I spoke with patient who asked me to call his father about his PTA coumadin dose. Pt's father state he takes 1 x Coumadin  5mg  and 1 x 2.5mg  Coumadin daily for total 7.5mg  daily, last dose taken on 05/04/12. I called and spoke with Dr. Imogene Burn who asked for pharmacy to dose this pt's coumadin per protocol.  Also is on Aziothioprine which can decrease effect of coumadin.   Plan: Give Coumadin 10 mg po today.  INR qAM.   Noah Delaine, RPh Clinical Pharmacist Pager: [phone] 05/11/2012 14:28 PM

## 2012-05-11 NOTE — Progress Notes (Signed)
PT Cancellation Note  Patient Details Name: Rick Edwards MRN: [id number] DOB: [birth year]   Cancelled Treatment:     PT order to start 05/13/12.  Clarified with Dr. Imogene Burn - PT to defer evaluation until Monday, 05/13/12.  Will return at that time.  Thank you.   Vena Austria 05/11/2012, 1:27 PM [phone]

## 2012-05-11 NOTE — Progress Notes (Signed)
[facility] [place]  Daily Progress Note  Assessment/Planning: POD #1 s/p R TMA   Lovenox bridge to coumadin for AT3 def.: Coumadin still subtherapeutic  Adjust pain regimen: increase to 2 percocet (home regimen) and chg from morphine to dilaudid prn for breakthrough  Awaiting PT/OT eval and SNF placement  Subjective  - 1 Day Post-Op  C/o poorly controlled pain  Objective Filed Vitals:   05/10/12 1021 05/10/12 1318 05/10/12 1959 05/11/12 0522  BP:  151/86 156/91 135/74  Pulse:  93 83 81  Temp:  98.9 F (37.2 C) 99 F (37.2 C) 98.5 F (36.9 C)  TempSrc:  Oral Oral Oral  Resp:  18 18 19   Height: 5\' 9"  (1.753 m)     Weight: 134 lb 11.2 oz (61.1 kg)     SpO2:  100% 100% 100%    Intake/Output Summary (Last 24 hours) at 05/11/12 0945 Last data filed at 05/11/12 0900  Gross per 24 hour  Intake    600 ml  Output   1650 ml  Net  -1050 ml    PULM  CTAB CV  RRR GI  soft, NTND VASC  R foot bandaged: no active bleeding  Laboratory CBC    Component Value Date/Time   WBC 6.9 05/10/2012 1050   WBC 5.9 04/16/2012 0939   HGB 11.9* 05/10/2012 1050   HGB 11.6* 04/16/2012 0939   HCT 36.3* 05/10/2012 1050   HCT 35.2* 04/16/2012 0939   PLT 319 05/10/2012 1050   PLT 425* 04/16/2012 0939    BMET    Component Value Date/Time   NA 141 05/06/2012 0910   K 3.6 05/06/2012 0910   CL 104 05/06/2012 0910   CO2 29 03/24/2012 0515   GLUCOSE 95 05/06/2012 0910   GLUCOSE 101 02/26/2008 1535   BUN 8 05/06/2012 0910   CREATININE 0.80 05/06/2012 0910   CALCIUM 8.0* 03/24/2012 0515   GFRNONAA >90 03/24/2012 0515   GFRAA >90 03/24/2012 0515   Lab Results  Component Value Date   INR 1.07 05/11/2012   INR 1.06 05/10/2012   INR 1.3 05/06/2012    Leonides Sake, MD [facility] [place] Office: [phone] Pager: [phone]  05/11/2012, 9:45 AM

## 2012-05-12 LAB — CBC
MCV: 88.2 fL (ref 78.0–100.0)
Platelets: 283 10*3/uL (ref 150–400)
RDW: 16.4 % — ABNORMAL HIGH (ref 11.5–15.5)
WBC: 6.5 10*3/uL (ref 4.0–10.5)

## 2012-05-12 LAB — PROTIME-INR
INR: 1.14 (ref 0.00–1.49)
Prothrombin Time: 14.4 seconds (ref 11.6–15.2)

## 2012-05-12 MED ORDER — WARFARIN SODIUM 7.5 MG PO TABS
7.5000 mg | ORAL_TABLET | Freq: Once | ORAL | Status: AC
  Administered 2012-05-12: 7.5 mg via ORAL
  Filled 2012-05-12: qty 1

## 2012-05-12 NOTE — Progress Notes (Signed)
ANTICOAGULATION CONSULT NOTE - Initial Consult  Pharmacy Consult for Lovenox per Pharmacist protocol / Coumadin -changed to per pharmacy protocol per Dr. Imogene Burn Indication: history of AT3 deficiency  Allergies  Allergen Reactions  . Mesalamine     REACTION: Rash    Patient Measurements: Weight = 61 kg  Labs:  Basename 05/12/12 0604 05/11/12 0630 05/10/12 1050  HGB 10.4* -- 11.9*  HCT 32.0* -- 36.3*  PLT 283 -- 319  APTT -- -- --  LABPROT 14.4 13.8 13.7  INR 1.14 1.07 1.06  HEPARINUNFRC -- -- --  CREATININE -- -- --  CKTOTAL -- -- --  CKMB -- -- --  TROPONINI -- -- --    Estimated Creatinine Clearance: 92.3 ml/min (by C-G formula based on Cr of 0.8).   Medical History: Past Medical History  Diagnosis Date  . Crohn's disease   . Perianal abscess 2001    s/p right hemicolectomy, and drainage of retroperitoneal abcess 2003  . Hypertension   . Hearing loss   . GI bleed 6/11    w/ normal EGD 6/11, 3 PRBCs   . Anemia     anemia thrombocytopenia, saw Hematology 2011, can not r/o myeloproliferative d/c    Assessment: INR = 1.76 in this 56 year old male on Coumadin prior to admission  for antithrombin III deficiency (03-2012).  POD#2 s/p right transmetatarsal amputation.  Lovenox bridge to coumadin. INR is subtherapeutic. I clarified his home dose of coumadin with the patient's father who stated his dose was a total of 7.5mg  daily (takes 1 x 5mg  and 1 x 2.5mg  tablets daily). Last PTA dose taken on 9/28. Bridging with Lovenox 60mg  SQ q12h.  CrCl ~ 68ml/min.  CBC stable, Hgb 10.4 , PLTC 283K. No bleeding reported. He has a h/o upper GI bleed with normal EGD  01/2010. Also is on Aziothioprine which can decrease effect of coumadin.    Goal of Therapy:  INR 2-3 Monitor platelets by anticoagulation protocol: Yes Appropriate Lovenox dosing   Plan:  Continue Lovenox 60 mg sq Q 12 hours Coumadin 7.5 mg x 1 today  Follow up INR, CBC.  Noah Delaine, RPh Clinical Pharmacist Pager:  [phone] 05/12/2012,2:23 PM

## 2012-05-12 NOTE — Progress Notes (Signed)
[facility] [place]  Daily Progress Note  Assessment/Planning: POD #2 s/p TMA   PT/OT starting tomorrow  Rehab placement pending those evaluations  Subjective  - 2 Days Post-Op  Pain better controlled  Objective Filed Vitals:   05/11/12 0522 05/11/12 1426 05/11/12 2012 05/12/12 0515  BP: 135/74 114/55 127/79 133/69  Pulse: 81 66 85 83  Temp: 98.5 F (36.9 C) 98.7 F (37.1 C) 98.3 F (36.8 C) 98.3 F (36.8 C)  TempSrc: Oral Oral Oral Oral  Resp: 19 16 18 18   Height:      Weight:      SpO2: 100% 98% 99% 100%    Intake/Output Summary (Last 24 hours) at 05/12/12 0849 Last data filed at 05/12/12 0500  Gross per 24 hour  Intake   2480 ml  Output   2025 ml  Net    455 ml    PULM  CTAB CV  RRR GI  soft, NTND VASC  R TMA: clean, somewhat macerated medial corner, inc c/d/i, staples in place  Laboratory CBC    Component Value Date/Time   WBC 6.5 05/12/2012 0604   WBC 5.9 04/16/2012 0939   HGB 10.4* 05/12/2012 0604   HGB 11.6* 04/16/2012 0939   HCT 32.0* 05/12/2012 0604   HCT 35.2* 04/16/2012 0939   PLT 283 05/12/2012 0604   PLT 425* 04/16/2012 0939    BMET    Component Value Date/Time   NA 141 05/06/2012 0910   K 3.6 05/06/2012 0910   CL 104 05/06/2012 0910   CO2 29 03/24/2012 0515   GLUCOSE 95 05/06/2012 0910   GLUCOSE 101 02/26/2008 1535   BUN 8 05/06/2012 0910   CREATININE 0.80 05/06/2012 0910   CALCIUM 8.0* 03/24/2012 0515   GFRNONAA >90 03/24/2012 0515   GFRAA >90 03/24/2012 0515    Leonides Sake, MD [facility] [place] Office: [phone] Pager: [phone]  05/12/2012, 8:49 AM

## 2012-05-13 LAB — PROTIME-INR
INR: 1.11 (ref 0.00–1.49)
Prothrombin Time: 14.2 seconds (ref 11.6–15.2)

## 2012-05-13 MED ORDER — WARFARIN SODIUM 10 MG PO TABS
10.0000 mg | ORAL_TABLET | Freq: Once | ORAL | Status: AC
  Administered 2012-05-13: 10 mg via ORAL
  Filled 2012-05-13: qty 1

## 2012-05-13 NOTE — Progress Notes (Signed)
Physical Therapy Evaluation Patient Details Name: Rick Edwards MRN: [id number] DOB: 09-04-1955 Today's Date: 05/13/2012 Time: 8295-6213 PT Time Calculation (min): 30 min  PT Assessment / Plan / Recommendation Clinical Impression  Pt is a 56 y.o. M s/p R TMA.  Pt needs cueing for safety and balance.  During ambulation began to increase velocity which decreased safety awareness.  Pt stated wanting to use  versus RW need to consult tomorrow for optimal safety home.  Pt will benefit from acute physical therapy to increase strength, independence, endurance, and safety/balance.    PT Assessment  Patient needs continued PT services    Follow Up Recommendations  Home health PT;Supervision/Assistance - 24 hour;Other (comment) (Home Health RN)    Does the patient have the potential to tolerate intense rehabilitation      Barriers to Discharge None      Equipment Recommendations  None recommended by PT    Recommendations for Other Services Other (comment) (None)   Frequency Min 5X/week    Precautions / Restrictions Precautions Precautions: Fall Restrictions Weight Bearing Restrictions: Yes RLE Weight Bearing: Partial weight bearing RLE Partial Weight Bearing Percentage or Pounds: Heel only   Pertinent Vitals/Pain 7/10 pain in R LE.      Mobility  Bed Mobility Bed Mobility: Supine to Sit;Sitting - Scoot to Edge of Bed Supine to Sit: 5: Supervision;HOB elevated Sitting - Scoot to Edge of Bed: 5: Supervision;With rail Details for Bed Mobility Assistance: Cues for proper technique and safety Transfers Transfers: Sit to Stand;Stand to Sit Sit to Stand: 4: Min guard;From bed;With upper extremity assist Stand to Sit: 4: Min guard;To chair/3-in-1;With upper extremity assist Details for Transfer Assistance: Cues for safety, proper technique, LE placement, and hand placement,.  Cues for safety and proper technique Ambulation/Gait Ambulation/Gait Assistance: 4: Min  assist Ambulation Distance (Feet): 30 Feet Assistive device: Rolling walker Ambulation/Gait Assistance Details: (A) for RW placement and balance.  Cues for proper technique and safety Gait Pattern: Step-to pattern;Decreased stride length Stairs: No Wheelchair Mobility Wheelchair Mobility: No    Shoulder Instructions     Exercises     PT Diagnosis: Difficulty walking;Abnormality of gait;Generalized weakness;Acute pain  PT Problem List: Decreased strength;Decreased range of motion;Decreased activity tolerance;Decreased balance;Decreased mobility;Decreased knowledge of use of DME;Decreased safety awareness;Pain PT Treatment Interventions: DME instruction;Gait training;Stair training;Functional mobility training;Therapeutic activities;Therapeutic exercise;Balance training;Neuromuscular re-education;Patient/family education   PT Goals Acute Rehab PT Goals PT Goal Formulation: With patient Time For Goal Achievement: 05/13/12 Potential to Achieve Goals: Good Pt will go Supine/Side to Sit: with modified independence;with HOB 0 degrees PT Goal: Supine/Side to Sit - Progress: Goal set today Pt will go Sit to Supine/Side: with modified independence;with HOB 0 degrees PT Goal: Sit to Supine/Side - Progress: Goal set today Pt will go Sit to Stand: with modified independence;with upper extremity assist PT Goal: Sit to Stand - Progress: Goal set today Pt will go Stand to Sit: with modified independence;with upper extremity assist PT Goal: Stand to Sit - Progress: Goal set today Pt will Ambulate: >150 feet;with modified independence;with least restrictive assistive device PT Goal: Ambulate - Progress: Goal set today Pt will Go Up / Down Stairs: 3-5 stairs;with modified independence;with least restrictive assistive device PT Goal: Up/Down Stairs - Progress: Goal set today Pt will Perform Home Exercise Program: Independently PT Goal: Perform Home Exercise Program - Progress: Goal set today  Visit  Information  Last PT Received On: 05/13/12 Assistance Needed: +1    Subjective Data  Subjective: To get up  out of this bed Patient Stated Goal: To go home   Prior Functioning  Home Living Lives With: Significant other (Girlfriend) Available Help at Discharge: Family;Friend(s) (Girlfriend and parents) Type of Home: House Home Access: Stairs to enter Secretary/administrator of Steps: 3 Entrance Stairs-Rails: None Home Layout: One level Bathroom Shower/Tub: Forensic scientist: Standard Bathroom Accessibility: No Home Adaptive Equipment: Straight cane;Walker - rolling Prior Function Level of Independence: Independent with assistive device(s) (walker or cane) Able to Take Stairs?: Yes Driving: Yes 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) Communication Communication: HOH Dominant Hand: Right    Cognition  Overall Cognitive Status: Appears within functional limits for tasks assessed/performed Arousal/Alertness: Awake/alert Orientation Level: Appears intact for tasks assessed Behavior During Session: [facility] for tasks performed    Extremity/Trunk Assessment     Balance    End of Session PT - End of Session Equipment Utilized During Treatment: Gait belt Activity Tolerance: Patient tolerated treatment well Patient left: in chair;with call bell/phone within reach Nurse Communication: Mobility status  GP     DITOMMASO, AMY 05/13/2012, 3:33 PM [place], PT DPT [phone]

## 2012-05-13 NOTE — Clinical Social Work Psychosocial (Signed)
Clinical Social Work Department BRIEF PSYCHOSOCIAL ASSESSMENT 05/13/2012  Patient:  Rick Edwards, Rick Edwards     Account Number:  [id number]     Admit date:  05/10/2012  Clinical Social Worker:  Thomasene Mohair  Date/Time:  05/13/2012 11:00 AM  Referred by:  Physician  Date Referred:  05/11/2012 Referred for  SNF Placement   Other Referral:   Interview type:  Patient Other interview type:   Pt's father at bedside as well    PSYCHOSOCIAL DATA Living Status:  SIGNIFICANT OTHER Admitted from facility:   Level of care:   Primary support name:  Rick Edwards/[phone] Primary support relationship to patient:  PARENT Degree of support available:   strong    CURRENT CONCERNS Current Concerns  Post-Acute Placement   Other Concerns:    SOCIAL WORK ASSESSMENT / PLAN CSW was referred to Pt to assist with dc planning. Pt lives with his girlfriend and has parents nearby. Pt recently had a colectomy in September and discharged to his parents' house. Pt's father is at bedside and shared that he is worried about caring for his son after this surgery as well.  Pt is agreeable to SNF. CSW explained insurance authorization procress and the need for a back-up plan if SNF is not an option. Pt is scheduled for a PT evaluation today that will help clarify level of care needs at dc.   Assessment/plan status:  Psychosocial Support/Ongoing Assessment of Needs Other assessment/ plan:   Information/referral to community resources:    PATIENT'S/FAMILY'S RESPONSE TO PLAN OF CARE: Pt and Pt's father are agreeable to st-snf to help Pt transition home independently.   Frederico Hamman, LCSW [phone]

## 2012-05-13 NOTE — Progress Notes (Signed)
Orthopedic Tech Progress Note Patient Details:  Rick Edwards [birth year] [id number]  Ortho Devices Type of Ortho Device:  (darco shoe) Ortho Device/Splint Location: right foot Ortho Device/Splint Interventions: Application   [name] 05/13/2012, 4:02 PM

## 2012-05-13 NOTE — Progress Notes (Addendum)
[facility] [place]  Postoperative Visit - Amputation  Date of Surgery: 05/10/2012 Procedure(s): TRANSMETATARSAL AMPUTATION Right Surgeon: Surgeon(s): Chuck Hint, MD POD: 3 Days Post-Op  Subjective Rick Edwards is a 56 y.o. male who is S/P Right Procedure(s): TRANSMETATARSAL AMPUTATION.  Pt.denies increased pain in the stump. The patient notes pain is well controlled. Pt. denies phantom pain.  Significant Diagnostic Studies: CBC Lab Results  Component Value Date   WBC 6.5 05/12/2012   HGB 10.4* 05/12/2012   HCT 32.0* 05/12/2012   MCV 88.2 05/12/2012   PLT 283 05/12/2012    BMET    Component Value Date/Time   NA 141 05/06/2012 0910   K 3.6 05/06/2012 0910   CL 104 05/06/2012 0910   CO2 29 03/24/2012 0515   GLUCOSE 95 05/06/2012 0910   GLUCOSE 101 02/26/2008 1535   BUN 8 05/06/2012 0910   CREATININE 0.80 05/06/2012 0910   CALCIUM 8.0* 03/24/2012 0515   GFRNONAA >90 03/24/2012 0515   GFRAA >90 03/24/2012 0515    COAG Lab Results  Component Value Date   INR 1.11 05/13/2012   INR 1.14 05/12/2012   INR 1.07 05/11/2012   No results found for this basename: PTT     Intake/Output Summary (Last 24 hours) at 05/13/12 0812 Last data filed at 05/13/12 0500  Gross per 24 hour  Intake    360 ml  Output    400 ml  Net    -40 ml   No data found.    Physical Examination  BP Readings from Last 3 Encounters:  05/13/12 134/79  05/13/12 134/79  05/06/12 108/72   Temp Readings from Last 3 Encounters:  05/13/12 98.1 F (36.7 C) Oral  05/13/12 98.1 F (36.7 C) Oral  05/06/12 98.1 F (36.7 C)    SpO2 Readings from Last 3 Encounters:  05/13/12 100%  05/13/12 100%  05/06/12 98%   Pulse Readings from Last 3 Encounters:  05/13/12 74  05/13/12 74  05/06/12 79    Pt is A&Ox3  WDWN male with no complaints  Right amputation wound is healing well.  There is good bone coverage in the stump  Right leg is warm and well  perfused, Assessment/plan:  [name] is a 56 y.o. male who is s/p Right Procedure(s): TRANSMETATARSAL AMPUTATION PT/OT - Darco shoe  The patient's stump is viable.  Follow-up 4 weeks from surgery  Marlowe Shores 8:12 AM 05/13/[phone]  Agree with above. Right transmetatarsal amputation inspected and looks fine. Physical therapy to evaluate patient and began ambulation with partial weightbearing heel only using the Darko shoe. He may need skilled nursing facility for outpatient physical therapy before going home.  Di Kindle. Edilia Bo, MD, FACS Beeper [phone] 05/13/2012

## 2012-05-13 NOTE — Care Management Note (Addendum)
    Page 1 of 2   05/16/2012     2:19:16 PM   CARE MANAGEMENT NOTE 05/16/2012  Patient:  Rick Edwards, Rick Edwards   Account Number:  [id number]  Date Initiated:  05/13/2012  Documentation initiated by:  Camree Wigington  Subjective/Objective Assessment:   PT S/P RT FOOT TOES AMPUTATION.  PTA, PT INDEPENDENT, LIVES WITH GIRLFRIEND.     Action/Plan:   PT DESIRES SNF PLACEMENT, BUT INSURANCE DENIED AUTHORIZATION.  PT WILL NEED HOME HEALTH FOLLOW UP AT DISCHARGE.  WOULD RECOMMEND HHRN, PT, AND OT AT DC.  WILL NEED ORDERS IN EPIC.   Anticipated DC Date:  05/16/2012   Anticipated DC Plan:  HOME W HOME HEALTH SERVICES  In-house referral  Clinical Social Worker      DC Planning Services  CM consult      [facility] Choice  HOME HEALTH   Choice offered to / List presented to:  C-1 Patient        HH arranged  HH-1 RN  HH-2 PT      [facility] agency  [facility]   Status of service:  Completed, signed off Medicare Important Message given?   (If response is "NO", the following Medicare IM given date fields will be blank) Date Medicare IM given:   Date Additional Medicare IM given:    Discharge Disposition:  HOME W HOME HEALTH SERVICES  Per UR Regulation:  Reviewed for med. necessity/level of care/duration of stay  If discussed at Long Length of Stay Meetings, dates discussed:    Comments:  05/16/12 Rosalita Chessman [phone] PT FOR DC HOME TODAY.  NOTIFIED [facility] OF DISCHARGE.  05/14/12 Daquisha Clermont,RN,BSN 1430 MET WITH PT TO DISCUSS DC PLANS.  PT DESIRES HH WITH [facility], WHO HE USED WITH PREVIOUS SURGERY.  PT DESIRES A CANE, BUT JUST GOT A WALKER A MONTH AGO.  INSURANCE WILL NOT PAY FOR A CANE SINCE HE JUST GOT A WALKER.  HE STATES THAT HE WILL GET CANE ON HIS OWN. REFERRAL TO [facility] ; START OF CARE 24-48H POST DC DATE.

## 2012-05-13 NOTE — Progress Notes (Signed)
Utilization Review Completed.Gregoire Bennis T10/02/2012   

## 2012-05-13 NOTE — Clinical Social Work Note (Signed)
CSW met with Pt and physical therapist after PT eval. Pt reportedly did will with PT and feels that he will continue his improvement at home with PT.  CSW discussed Pt's long-term goals and ongoing medical needs. Pt states that he has strong support from his parents and siblings if needed during his recovery.  Pt does not feel that SNF is needed at this time. CSW updated CM and will sign off. Please reconsult as needed for further assistance.    Rick Hamman, LCSW [phone]

## 2012-05-13 NOTE — Progress Notes (Signed)
Pharmacy Consult-Anticoagulation  Pharmacy Consult:  56 year old AA male on Coumadin prior to admission for antithrombin III deficiency.   POD#2 s/p right transmetatarsal amputation.  He is currently on Coumadin with Lovenox bridging for AT III deficiency.   Current Labs: Hematology  Basename 05/13/12 0640 05/12/12 0604 05/11/12 0630 05/10/12 1050  HGB -- 10.4* -- 11.9*  HCT -- 32.0* -- 36.3*  PLT -- 283 -- 319  LABPROT 14.2 14.4 13.8 --  INR 1.11 1.14 1.07 --   Lab Results  Component Value Date   INR 1.11 05/13/2012   INR 1.14 05/12/2012   INR 1.07 05/11/2012    Estimated Creatinine Clearance: 92.3 ml/min (by C-G formula based on Cr of 0.8).  Current Meds:  amLODipine 5 mg Oral q morning - 10a  azaTHIOprine 50 mg Oral Daily  enoxaparin  injection 60 mg Subcutaneous Q12H  pantoprazole 40 mg Oral Q1200  spironolactone 25 mg Oral q morning - 10a  warfarin 7.5 mg Oral ONCE-1800   Assessment:  INR is SUBtherapeutic, 1.11 following 2 doses of Coumadin.  He remains on Lovenox bridging until INR is therapeutic.  No bleeding complications observed.  Goal/Plan:  INR goal is 2-3.  Coumadin 10 mg po today.  Continue Lovenox 60 mg sq q 12 hours.  Daily INR's, CBC.  Coumadin discharge education started.  Hadeel Hillebrand, Elisha Headland, Pharm.D. 05/13/2012  9:33 AM

## 2012-05-14 LAB — PROTIME-INR: Prothrombin Time: 15.3 seconds — ABNORMAL HIGH (ref 11.6–15.2)

## 2012-05-14 MED ORDER — SODIUM CHLORIDE 0.9 % IJ SOLN
3.0000 mL | Freq: Two times a day (BID) | INTRAMUSCULAR | Status: DC
  Administered 2012-05-14 – 2012-05-15 (×4): 3 mL via INTRAVENOUS

## 2012-05-14 MED ORDER — WARFARIN SODIUM 2.5 MG PO TABS
12.5000 mg | ORAL_TABLET | Freq: Once | ORAL | Status: AC
  Administered 2012-05-14: 12.5 mg via ORAL
  Filled 2012-05-14: qty 1

## 2012-05-14 NOTE — Progress Notes (Signed)
Pharmacy Consult-Anticoagulation  Pharmacy Consult:   56 year old AA male on Coumadin prior to admission for antithrombin III deficiency. POD #3 s/p right transmetatarsal amputation. He is currently on Coumadin with Lovenox bridging for AT III deficiency.    Current Labs: Hematology  Basename 05/14/12 1005 05/13/12 0640 05/12/12 0604  HGB -- -- 10.4*  HCT -- -- 32.0*  PLT -- -- 283  LABPROT 15.3* 14.2 14.4  INR 1.23 1.11 1.14   Lab Results  Component Value Date   INR 1.23 05/14/2012   INR 1.11 05/13/2012   INR 1.14 05/12/2012    Estimated Creatinine Clearance: 92.3 ml/min (by C-G formula based on Cr of 0.8).  Current Meds:    amLODipine 5 mg Oral q morning - 10a  azaTHIOprine 50 mg Oral Daily  enoxaparin (LOVENOX) injection 60 mg Subcutaneous Q12H  pantoprazole 40 mg Oral Q1200  sodium chloride 3 mL Intravenous Q12H  spironolactone 25 mg Oral q morning - 10a  warfarin 10 mg Oral ONCE-1800  Warfarin - Pharmacist Dosing Inpatient  Does not apply q1800    Assessment:  INR remains SUBtherapeutic after 3 doses of Coumadin.  INR 1.23.      No evidence of bleeding complications.  Lovenox continuing for Lovenox bridging.  Goal/Plan:  INR goal is 2-3.  Coumadin 12.5 mg po today.  Continue Lovenox as ordered.  Daily INR's, CBC.  Emin Foree, Elisha Headland, Pharm.D. 05/14/2012  11:19 AM

## 2012-05-14 NOTE — Progress Notes (Addendum)
[facility] [place]  Postoperative Visit - Amputation  Date of Surgery: 05/10/2012 Procedure(s): TRANSMETATARSAL AMPUTATION Right Surgeon: Surgeon(s): Chuck Hint, MD POD: 4 Days Post-Op  Subjective Rick Edwards is a 56 y.o. male who is S/P Right Procedure(s): TRANSMETATARSAL AMPUTATION.  Pt.denies increased pain in the stump. The patient notes pain is fairly well controlled. Pt. denies phantom pain. Not ambulating with Darko shoe yet  Significant Diagnostic Studies: CBC Lab Results  Component Value Date   WBC 6.5 05/12/2012   HGB 10.4* 05/12/2012   HCT 32.0* 05/12/2012   MCV 88.2 05/12/2012   PLT 283 05/12/2012    BMET    Component Value Date/Time   NA 141 05/06/2012 0910   K 3.6 05/06/2012 0910   CL 104 05/06/2012 0910   CO2 29 03/24/2012 0515   GLUCOSE 95 05/06/2012 0910   GLUCOSE 101 02/26/2008 1535   BUN 8 05/06/2012 0910   CREATININE 0.80 05/06/2012 0910   CALCIUM 8.0* 03/24/2012 0515   GFRNONAA >90 03/24/2012 0515   GFRAA >90 03/24/2012 0515    COAG Lab Results  Component Value Date   INR 1.11 05/13/2012   INR 1.14 05/12/2012   INR 1.07 05/11/2012   No results found for this basename: PTT     Intake/Output Summary (Last 24 hours) at 05/14/12 1009 Last data filed at 05/14/12 0700  Gross per 24 hour  Intake    840 ml  Output   1300 ml  Net   -460 ml   No data found.    Physical Examination  BP Readings from Last 3 Encounters:  05/14/12 120/72  05/14/12 120/72  05/06/12 108/72   Temp Readings from Last 3 Encounters:  05/14/12 98.4 F (36.9 C)   05/14/12 98.4 F (36.9 C)   05/06/12 98.1 F (36.7 C)    SpO2 Readings from Last 3 Encounters:  05/14/12 99%  05/14/12 99%  05/06/12 98%   Pulse Readings from Last 3 Encounters:  05/14/12 85  05/14/12 85  05/06/12 79    Pt is A&Ox3  WDWN male with no complaints  Right amputation wound is healing well.  RLE warm, dressing dry- examined by Dr. Edilia Bo  yest.  Assessment/plan:  Rick Edwards is a 56 y.o. male who is s/p Right Procedure(s): TRANSMETATARSAL AMPUTATION  The patient's stump is viable  Pt wants to go home with home PT/ RN  Requests cane, has walker.  Follow-up 4 weeks from surgery  Rick Edwards 10:09 AM 05/14/2012 [phone]  Right TMA healing well Abdominal wound healing well Needs practice with Darco shoe home when safe on this.  Fabienne Bruns, MD [facility] [place] Office: [phone] Pager: [phone]

## 2012-05-14 NOTE — Progress Notes (Signed)
Received phone call from patient's sister. She had questions regarding patient's pain medication. She stated that patient had called her and stated that his pain medication was not working. She wanted to know if there was a communication issue regarding his pain. I advised her that we had just been in the patient's room during bedside report and he had not expressed any concerns to her. I advised I would go and discuss with the patient. Patient is in the recliner talking on his cell phone laughing and appears to be comfortable. When asked about whether or not his pain medication was working he indicated that his foot was still hurting despite taking the percocet. I advised I would note this on his chart and would give him a dose of IV dilaudid.

## 2012-05-14 NOTE — Progress Notes (Signed)
Physical Therapy Treatment Patient Details Name: Rick Edwards MRN: [id number] DOB: 1955-12-27 Today's Date: 05/14/2012 Time: 4098-1191 PT Time Calculation (min): 14 min  PT Assessment / Plan / Recommendation Comments on Treatment Session  Pt s/p transmet amputation on rt.  Pt doing well.    Follow Up Recommendations  Home health PT;Supervision for mobility/OOB     Does the patient have the potential to tolerate intense rehabilitation     Barriers to Discharge        Equipment Recommendations  None recommended by PT    Recommendations for Other Services    Frequency Min 5X/week   Plan Discharge plan remains appropriate;Frequency remains appropriate    Precautions / Restrictions Precautions Precautions: Fall Restrictions RLE Weight Bearing: Partial weight bearing RLE Partial Weight Bearing Percentage or Pounds: on heel   Pertinent Vitals/Pain Pain 8/10 rt foot. Nursing aware.    Mobility  Bed Mobility Supine to Sit: 6: Modified independent (Device/Increase time);HOB elevated Sitting - Scoot to Edge of Bed: 6: Modified independent (Device/Increase time) Transfers Sit to Stand: 5: Supervision;With upper extremity assist;From bed Stand to Sit: 5: Supervision;With upper extremity assist;With armrests;To chair/3-in-1 Ambulation/Gait Ambulation/Gait Assistance: 4: Min guard Ambulation Distance (Feet): 100 Feet Assistive device: Rolling walker Ambulation/Gait Assistance Details: verbal cues to push walker instead of push up. Gait Pattern: Step-to pattern General Gait Details: Pt putting very little wt on rt. foot.  Holds rt foot out in front of him.    Exercises     PT Diagnosis:    PT Problem List:   PT Treatment Interventions:     PT Goals Acute Rehab PT Goals PT Goal: Supine/Side to Sit - Progress: Met PT Goal: Sit to Stand - Progress: Progressing toward goal PT Goal: Stand to Sit - Progress: Progressing toward goal PT Goal: Ambulate - Progress: Progressing  toward goal  Visit Information  Last PT Received On: 05/14/12 Assistance Needed: +1    Subjective Data  Subjective: Pt states he hopes to go home soon   Cognition  Overall Cognitive Status: Appears within functional limits for tasks assessed/performed Arousal/Alertness: Awake/alert Orientation Level: Appears intact for tasks assessed Behavior During Session: [facility] for tasks performed    Balance  Static Standing Balance Static Standing - Balance Support: Bilateral upper extremity supported Static Standing - Level of Assistance: 5: Stand by assistance  End of Session PT - End of Session Equipment Utilized During Treatment: Gait belt Activity Tolerance: Patient tolerated treatment well Patient left: in chair;with call bell/phone within reach Nurse Communication: Mobility status   GP     Barnes Florek 05/14/2012, 4:44 PM  [employer] PT [phone]

## 2012-05-14 NOTE — Consult Note (Signed)
WOC ostomy consult  Stoma type/location: Pt has had ileostomy several months and states he is independent with pouching application, emptying, and obtaining supplies when at home.  Since he has been in the hospital, he is not able to empty and states the pouch has become over-filled and is leaking.  He prefers to use one piece  pouches with filters.    Stomal assessment/size: Red and viable, slightly above skin level, Peristomal assessment:  Denuded and sore surrounding stoma, appearance consistent with moisture acquired skin damage.  Treatment options for stomal/peristomal skin:  Pt usually uses powder.  Pouches only lasting 2 days before leaking.  Present pouch was leaking behind barrier.  Pt familiar with dusting to protect skin. Output  100cc green liquid stool. Ostomy pouching: 1pc flat flexible with barrier ring added. Education provided: Demonstrated use of barrier ring to improve wear time and protect skin.  Supplies ordered to bedside. Will not plan to follow further unless re-consulted.  [address] RN, MSN, [employer]  [phone]

## 2012-05-15 LAB — CBC
HCT: 34.9 % — ABNORMAL LOW (ref 39.0–52.0)
Hemoglobin: 11.3 g/dL — ABNORMAL LOW (ref 13.0–17.0)
MCH: 28.8 pg (ref 26.0–34.0)
MCHC: 32.4 g/dL (ref 30.0–36.0)
MCV: 88.8 fL (ref 78.0–100.0)
Platelets: 400 K/uL (ref 150–400)
RBC: 3.93 MIL/uL — ABNORMAL LOW (ref 4.22–5.81)
RDW: 15.9 % — ABNORMAL HIGH (ref 11.5–15.5)
WBC: 5 K/uL (ref 4.0–10.5)

## 2012-05-15 MED ORDER — HYDROMORPHONE HCL 2 MG PO TABS: 2.0000 mg | ORAL_TABLET | ORAL | Status: DC | PRN

## 2012-05-15 MED ORDER — ENSURE COMPLETE PO LIQD
237.0000 mL | Freq: Two times a day (BID) | ORAL | Status: DC
  Administered 2012-05-16: 237 mL via ORAL

## 2012-05-15 MED ORDER — WARFARIN VIDEO
1.0000 | Freq: Once | Status: AC
  Administered 2012-05-15: 1

## 2012-05-15 MED ORDER — WARFARIN SODIUM 7.5 MG PO TABS
15.0000 mg | ORAL_TABLET | Freq: Once | ORAL | Status: AC
  Administered 2012-05-15: 15 mg via ORAL
  Filled 2012-05-15: qty 2

## 2012-05-15 MED ORDER — COUMADIN BOOK
1.0000 | Freq: Once | Status: AC
  Administered 2012-05-15: 1
  Filled 2012-05-15: qty 1

## 2012-05-15 MED ORDER — WARFARIN SODIUM 5 MG PO TABS: ORAL_TABLET | ORAL | Status: DC

## 2012-05-15 MED ORDER — WARFARIN SODIUM 2.5 MG PO TABS: ORAL_TABLET | ORAL | Status: DC

## 2012-05-15 MED ORDER — HYDROMORPHONE HCL 2 MG PO TABS
2.0000 mg | ORAL_TABLET | ORAL | Status: DC | PRN
  Administered 2012-05-15 – 2012-05-16 (×6): 2 mg via ORAL
  Filled 2012-05-15 (×6): qty 1

## 2012-05-15 NOTE — Discharge Summary (Signed)
[facility] Discharge Summary   Patient ID:  Rick Edwards MRN: [id number] DOB/AGE: August 19, 1955 56 y.o.  Admit date: 05/10/2012 Discharge date: 05/16/12 Date of Surgery: 05/10/2012 Surgeon: Surgeon(s): Chuck Hint, MD  Admission Diagnosis: Peripheral Vascular Disease w/ Gangrene  Discharge Diagnoses:  Peripheral Vascular Disease w/ Gangrene  Secondary Diagnoses: Past Medical History  Diagnosis Date  . Crohn's disease   . Perianal abscess 2001    s/p right hemicolectomy, and drainage of retroperitoneal abcess 2003  . Hypertension   . Hearing loss   . GI bleed 6/11    w/ normal EGD 6/11, 3 PRBCs   . Anemia     anemia thrombocytopenia, saw Hematology 2011, can not r/o myeloproliferative d/c    Procedure(s): Right TRANSMETATARSAL AMPUTATION  Discharged Condition: good  HPI:  Rick Edwards is a 56 y.o. male who underwent a colectomy for Crohn's disease and postoperatively was noted to have an ischemic right foot. He underwent right popliteal and tibial peroneal trunk embolectomy with vein patch angioplasty and also posterior tibial artery embolectomy and vein patch angioplasty. 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 He has developed dry gangrene of the toes of the right foot which we have been following. He comes in for outpatient visit. He has noted minimal drainage from the right foot wound. He denies any fever. He said minimal pain in the right foot. Pt is admitted for Transmetatarsal Amputation   Hospital Course:  Rick Edwards is a 56 y.o. male is S/P Right Procedure(s): TRANSMETATARSAL AMPUTATION Extubated: POD # 0 Post-op wounds healing well Pt. Ambulating, voiding and taking PO diet without difficulty. Pt pain controlled with PO pain meds. Labs as below Complications:none  Consults:     Significant Diagnostic Studies: CBC Lab Results  Component Value Date   WBC 5.0  05/15/2012   HGB 11.3* 05/15/2012   HCT 34.9* 05/15/2012   MCV 88.8 05/15/2012   PLT 400 05/15/2012    COAG Lab Results  Component Value Date   INR 1.38 05/15/2012   INR 1.23 05/14/2012   INR 1.11 05/13/2012     Disposition:  Discharge to :Home Discharge Orders    Future Appointments: Provider: Department: Dept Phone: Center:   05/31/2012 2:15 PM Mariella Saa, MD [facility] [phone] None   07/12/2012 9:45 AM Krista Blue [facility] [phone] None   07/18/2012 9:15 AM Si Gaul, MD [facility] [phone] None     Future Orders Please Complete By Expires   Resume previous diet      Driving Restrictions      Comments:   No driving   Call MD for:  temperature >100.5      Call MD for:  redness, tenderness, or signs of infection (pain, swelling, bleeding, redness, odor or green/yellow discharge around incision site)      Call MD for:  severe or increased pain, loss or decreased feeling  in affected limb(s)      Change dressing (specify)      Comments:   Dressing change: 3x per week      Alhassan, [place]  Home Medication Instructions [employer]:[id number]   Printed on:05/15/12 1124  Medication Information                    amLODipine (NORVASC) 5 MG tablet Take 5 mg by mouth every morning.           spironolactone (ALDACTONE) 25 MG tablet  Take 1 tablet (25 mg total) by mouth every morning.           azaTHIOprine (IMURAN) 50 MG tablet Take 1 and 1/2 tab daily           warfarin (COUMADIN) 5 MG tablet Takes 1 x 5mg  tablet with 1 x 2.5mg  tablet (total dose 7.5mg ) daily. Last dose 05/04/12 (stopped for upcoming surgery).           warfarin (COUMADIN) 2.5 MG tablet As directed above           warfarin (COUMADIN) 5 MG tablet As directed above           Dilaudid 2mg  every 3 hours as needed for pain - RX given to pt for 40 tabs  Verbal and written Discharge instructions given to the patient. Wound care per Discharge AVS - dressing change to  right TMA 3 x per week H/H PT  Follow-up Information    Follow up with DICKSON,CHRISTOPHER S, MD. In 2 weeks. (office will arrange)    Contact information:   [address] [place] [address] [phone]          Signed: Marlowe Shores 05/15/2012, 11:24 AM

## 2012-05-15 NOTE — Progress Notes (Signed)
INITIAL ADULT NUTRITION ASSESSMENT Date: 05/15/2012   Time: 3:40 PM   INTERVENTION:  Ensure Complete twice daily (350 kcals, 13 gm protein per 8 fl oz bottle) RD to follow for nutrition care plan  Reason for Assessment: Consult  ASSESSMENT: Male 56 y.o.  Dx: gangrene of all toes of the right foot  Hx:  Past Medical History  Diagnosis Date  . Crohn's disease   . Perianal abscess 2001    s/p right hemicolectomy, and drainage of retroperitoneal abcess 2003  . Hypertension   . Hearing loss   . GI bleed 6/11    w/ normal EGD 6/11, 3 PRBCs   . Anemia     anemia thrombocytopenia, saw Hematology 2011, can not r/o myeloproliferative d/c    Related Meds:     . amLODipine  5 mg Oral q morning - 10a  . azaTHIOprine  50 mg Oral Daily  . coumadin book  1 each Does not apply Once  . enoxaparin (LOVENOX) injection  60 mg Subcutaneous Q12H  . pantoprazole  40 mg Oral Q1200  . sodium chloride  3 mL Intravenous Q12H  . spironolactone  25 mg Oral q morning - 10a  . warfarin  12.5 mg Oral ONCE-1800  . warfarin  15 mg Oral ONCE-1800  . warfarin  1 each Does not apply Once  . Warfarin - Pharmacist Dosing Inpatient   Does not apply q1800    Ht: 5\' 9"  (175.3 cm)  Wt: 134 lb 11.2 oz (61.1 kg)  Ideal Wt: 72.7 kg % Ideal Wt: 84%  Usual Wt: 133 lb % Usual Wt: 101%  Body mass index is 19.89 kg/(m^2).  Food/Nutrition Related Hx: no triggers per admission nutrition screen  Labs:  CMP     Component Value Date/Time   NA 141 05/06/2012 0910   K 3.6 05/06/2012 0910   CL 104 05/06/2012 0910   CO2 29 03/24/2012 0515   GLUCOSE 95 05/06/2012 0910   GLUCOSE 101 02/26/2008 1535   BUN 8 05/06/2012 0910   CREATININE 0.80 05/06/2012 0910   CALCIUM 8.0* 03/24/2012 0515   PROT 5.5* 03/21/2012 0500   ALBUMIN 1.4* 03/21/2012 0500   AST 23 03/21/2012 0500   ALT 38 03/21/2012 0500   ALKPHOS 256* 03/21/2012 0500   BILITOT 0.4 03/21/2012 0500   GFRNONAA >90 03/24/2012 0515   GFRAA >90 03/24/2012 0515      Intake/Output Summary (Last 24 hours) at 05/15/12 1541 Last data filed at 05/15/12 1300  Gross per 24 hour  Intake    840 ml  Output    400 ml  Net    440 ml    Diet Order: Cardiac  Supplements/Tube Feeding: N/A  IVF: N/A  Estimated Nutritional Needs:   Kcal: 1700-1900 Protein: 80-90 gm Fluid: 1.7-1.9 L  Patient underwent a colectomy for Crohn's disease and postoperatively was noted to have an ischemic right foot; s/p right transmetatarsal amputation 10/4; states his appetite is good; reports weight stability PTA; PO intake 75-100% per flowsheet records; he would benefit from addition of supplementation -- would like Ensure -- RD to order.  NUTRITION DIAGNOSIS: -Increased nutrient needs (NI-5.1).  Status: Ongoing  RELATED TO: wound healing  AS EVIDENCE BY: estimated nutrition needs  MONITORING/EVALUATION(Goals): Goal: Oral intake with meals & supplements to meet >/= 90% of estimated nutrition needs Monitor: PO & supplemental intake, weight, labs, I/O's  EDUCATION NEEDS: -No education needs identified at this time  Dietitian #: [phone]  DOCUMENTATION CODES Per approved criteria  -  Not Applicable    Alger Memos 05/15/2012, 3:40 PM

## 2012-05-15 NOTE — Progress Notes (Signed)
Pharmacy Consult-Anticoagulation  Pharmacy Consult:  56 year old AA male on Coumadin prior to admission for antithrombin III deficiency. 5 Days Post-Op s/p right transmetatarsal amputation. 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    Current Labs: Hematology  Basename 05/15/12 0515 05/14/12 1005 05/13/12 0640  HGB 11.3* -- --  HCT 34.9* -- --  PLT 400 -- --  LABPROT 16.6* 15.3* 14.2  INR 1.38 1.23 1.11   Lab Results  Component Value Date   INR 1.38 05/15/2012   INR 1.23 05/14/2012   INR 1.11 05/13/2012    Estimated Creatinine Clearance: 92.3 ml/min (by C-G formula based on Cr of 0.8).  Current Pertinent Meds:   azaTHIOprine 50 mg Oral Daily  May decrease warfarin response     enoxaparin (LOVENOX) injection 60 mg SQ Q12H  warfarin 12.5 mg Oral ONCE-1800   Assessment:  INR is slow to respond despite increasing doses.   Azathiaprine may decrease warfarin response.  Renal function stable, CrCl   No bleeding complications observed.  Goal/Plan:  INR goal is 2-3.  Increase today's Coumadin to 15 mg x 1.  Continue Full dose Lovenox bridge 60 mg sq q 12 hours.  Daily INR's, CBC.  Sue Fernicola, Elisha Headland, Pharm.D. 05/15/2012  12:15 PM

## 2012-05-15 NOTE — Progress Notes (Signed)
VASCULAR PROGRESS NOTE  SUBJECTIVE: Having some issue with pain meds. "Whatever I got last night worked." I believe that he got IV dilaudid.  PHYSICAL EXAM: Filed Vitals:   05/14/12 0850 05/14/12 1442 05/14/12 2027 05/15/12 0547  BP: 120/72 123/74 139/77 131/84  Pulse: 85 88 89 84  Temp: 98.4 F (36.9 C) 98 F (36.7 C) 98 F (36.7 C) 97.5 F (36.4 C)  TempSrc:  Oral Oral Oral  Resp: 18 16 18 18   Height:      Weight:      SpO2: 99% 100% 100% 100%   Dressing on right TMA dry.   LABS: Lab Results  Component Value Date   WBC 5.0 05/15/2012   HGB 11.3* 05/15/2012   HCT 34.9* 05/15/2012   MCV 88.8 05/15/2012   PLT 400 05/15/2012   Lab Results  Component Value Date   INR 1.38 05/15/2012   ASSESSMENT/PLAN: 1. 5 Days Post-Op s/p: Right TMA 2. Doing well with PTx, they have recommended HHPT. 3. Insurance denied families request for SNF. 4. Home with HHPT tomorrow if pain adequately controlled.   Waverly Ferrari, MD, FACS Beeper: [phone] 05/15/2012

## 2012-05-15 NOTE — Progress Notes (Signed)
Physical Therapy Treatment and Discharge  Patient Details Name: Rick Edwards MRN: [id number] DOB: 22-Nov-1955 Today's Date: 05/15/2012 Time: 4098-1191 PT Time Calculation (min): 19 min  PT Assessment / Plan / Recommendation Comments on Treatment Session  Pt s/p right transmet amp who is mod I with mobility and completed stairs today.  Given a goal to ambulate 4 more times throughout the day. PT goals met, d/c from acute PT at this time.  Further needs to be addressed in the [facility] setting    Follow Up Recommendations  Home health PT;Supervision for mobility/OOB     Does the patient have the potential to tolerate intense rehabilitation     Barriers to Discharge        Equipment Recommendations  None recommended by PT    Recommendations for Other Services    Frequency Min 5X/week   Plan All goals met and education completed, patient dischaged from PT services    Precautions / Restrictions Precautions Precautions: Fall Required Braces or Orthoses: Other Brace/Splint (Darco right foot) Restrictions Weight Bearing Restrictions: Yes RLE Weight Bearing: Partial weight bearing RLE Partial Weight Bearing Percentage or Pounds: on heel   Pertinent Vitals/Pain 7/10 right foot pain, premedicated, extremity elevated after treatment    Mobility  Bed Mobility Bed Mobility: Supine to Sit Supine to Sit: 7: Independent;HOB flat Sitting - Scoot to Edge of Bed: 7: Independent Transfers Transfers: Sit to Stand;Stand to Sit Sit to Stand: 6: Modified independent (Device/Increase time);From bed Stand to Sit: 6: Modified independent (Device/Increase time);To chair/3-in-1 Ambulation/Gait Ambulation/Gait Assistance: 6: Modified independent (Device/Increase time) Ambulation Distance (Feet): 200 Feet Assistive device: Rolling walker Ambulation/Gait Assistance Details: pt using RW safely and maintaining PWB status Gait Pattern: Step-to pattern General Gait Details: continues to hold right foot  out in front, is putting foot on floor with Darco shoe on, minimizing wt-bearing. Discussed wearing of shoe on left foot to level hips with ambulation Stairs: Yes Stairs Assistance: 4: Min assist Stairs Assistance Details (indicate cue type and reason): min A to RW for support since he has to use it without handrails Stair Management Technique: No rails;Backwards;With walker Number of Stairs: 3  Wheelchair Mobility Wheelchair Mobility: No    Exercises     PT Diagnosis:    PT Problem List:   PT Treatment Interventions:     PT Goals Acute Rehab PT Goals PT Goal Formulation: With patient Time For Goal Achievement: 05/13/12 (goals met) Potential to Achieve Goals: Good Pt will go Supine/Side to Sit: with modified independence;with HOB 0 degrees PT Goal: Supine/Side to Sit - Progress: Met Pt will go Sit to Supine/Side: with modified independence;with HOB 0 degrees PT Goal: Sit to Supine/Side - Progress: Met Pt will go Sit to Stand: with modified independence;with upper extremity assist PT Goal: Sit to Stand - Progress: Met Pt will go Stand to Sit: with modified independence;with upper extremity assist PT Goal: Stand to Sit - Progress: Met Pt will Ambulate: >150 feet;with modified independence;with least restrictive assistive device PT Goal: Ambulate - Progress: Met Pt will Go Up / Down Stairs: 3-5 stairs;with least restrictive assistive device;with min assist (min A for RW) PT Goal: Up/Down Stairs - Progress: Met Pt will Perform Home Exercise Program: Independently PT Goal: Perform Home Exercise Program - Progress: Met  Visit Information  Last PT Received On: 05/15/12 Assistance Needed: +1    Subjective Data  Subjective: Pt states he hopes to go home soon Patient Stated Goal: To go home   Cognition  Overall  Cognitive Status: Appears within functional limits for tasks assessed/performed Arousal/Alertness: Awake/alert Orientation Level: Appears intact for tasks  assessed Behavior During Session: [facility] for tasks performed    Balance  Balance Balance Assessed: Yes Dynamic Standing Balance Dynamic Standing - Balance Support: No upper extremity supported;During functional activity Dynamic Standing - Level of Assistance: 6: Modified independent (Device/Increase time) Dynamic Standing - Comments: pt balance suffieicnt for lifting RW up or down to each step with no UE support  End of Session PT - End of Session Equipment Utilized During Treatment:  (none needed) Activity Tolerance: Patient tolerated treatment well Patient left: in chair;with call bell/phone within reach Nurse Communication: Mobility status   GP   Lyanne Co, PT  Acute Rehab Services  [phone]   Lyanne Co 05/15/2012, 9:04 AM

## 2012-05-16 MED ORDER — WARFARIN SODIUM 7.5 MG PO TABS
15.0000 mg | ORAL_TABLET | Freq: Once | ORAL | Status: AC
  Administered 2012-05-16: 15 mg via ORAL
  Filled 2012-05-16 (×2): qty 2

## 2012-05-16 MED ORDER — HYDROMORPHONE HCL 2 MG PO TABS: 2.0000 mg | ORAL_TABLET | ORAL | Status: DC | PRN

## 2012-05-16 NOTE — Progress Notes (Addendum)
[facility]   Postoperative Visit - Amputation  Date of Surgery: 05/10/2012 Procedure(s): TRANSMETATARSAL AMPUTATION Right Surgeon: Surgeon(s): Chuck Hint, MD POD: 6 Days Post-Op  Subjective Rick Edwards is a 57 y.o. male who is S/P Right Procedure(s): TRANSMETATARSAL AMPUTATION.  Pt.denies increased pain in the stump. The patient notes pain is well controlled. Pt. denies phantom pain.  Significant Diagnostic Studies: CBC Lab Results  Component Value Date   WBC 5.0 05/15/2012   HGB 11.3* 05/15/2012   HCT 34.9* 05/15/2012   MCV 88.8 05/15/2012   PLT 400 05/15/2012    BMET    Component Value Date/Time   NA 141 05/06/2012 0910   K 3.6 05/06/2012 0910   CL 104 05/06/2012 0910   CO2 29 03/24/2012 0515   GLUCOSE 95 05/06/2012 0910   GLUCOSE 101 02/26/2008 1535   BUN 8 05/06/2012 0910   CREATININE 0.80 05/06/2012 0910   CALCIUM 8.0* 03/24/2012 0515   GFRNONAA >90 03/24/2012 0515   GFRAA >90 03/24/2012 0515    COAG Lab Results  Component Value Date   INR 1.56* 05/16/2012   INR 1.38 05/15/2012   INR 1.23 05/14/2012   No results found for this basename: PTT     Intake/Output Summary (Last 24 hours) at 05/16/12 0802 Last data filed at 05/15/12 1729  Gross per 24 hour  Intake   1200 ml  Output    700 ml  Net    500 ml   No data found.    Physical Examination  BP Readings from Last 3 Encounters:  05/16/12 119/77  05/16/12 119/77  05/06/12 108/72   Temp Readings from Last 3 Encounters:  05/16/12 97.8 F (36.6 C) Oral  05/16/12 97.8 F (36.6 C) Oral  05/06/12 98.1 F (36.7 C)    SpO2 Readings from Last 3 Encounters:  05/16/12 99%  05/16/12 99%  05/06/12 98%   Pulse Readings from Last 3 Encounters:  05/16/12 81  05/16/12 81  05/06/12 79    Pt is A&Ox3  WDWN male with no complaints  Right amputation wound is clean, dry, no drainage and healing well.  There is good bone coverage in the stump Stump is warm and well  perfused, without drainage; without erythema   Assessment/plan:  Rick Edwards is a 56 y.o. male who is s/p Right Procedure(s): TRANSMETATARSAL AMPUTATION  The patient's stump is clean, dry, intact or viable.  Follow-up 4 weeks from surgery  Clinton Gallant [facility] 8:02 AM 05/16/[phone]   TMA looks good. For D/C today with HHPT.  Di Kindle. Edilia Bo, MD, FACS Beeper [phone] 05/16/2012

## 2012-05-16 NOTE — Progress Notes (Signed)
Patient discharged today to home with friend. Patient's brother transported patient to home. All discharge instructions explained verbally, with emphasis on important dates and times of follow-up. Patient able to perform teach back re: preventing infection and re-hospitalization. Rx given to patient. All belongings returned to patient. Patient transported to private vehicle via wheelchair by volunteer services. Rick Edwards

## 2012-05-16 NOTE — Progress Notes (Signed)
ANTICOAGULATION CONSULT - COUMADIN  Pharmacy Consult for Coumadin  HPI:  56 year old AA male on Coumadin prior to admission for antithrombin III deficiency. 6 Days Post-Op s/p right transmetatarsal amputation. 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   Weight: 134 lb 11.2 oz (61.1 kg)  Vitals: Blood pressure 119/77, pulse 81, temperature 97.8 F (36.6 C), temperature source Oral, resp. rate 19, height 5\' 9"  (1.753 m), weight 134 lb 11.2 oz (61.1 kg), SpO2 99.00%.  Current active problems: Active Problems:  * No active hospital problems. *    Medical / Surgical History: Past Medical History  Diagnosis Date  . Crohn's disease   . Perianal abscess 2001    s/p right hemicolectomy, and drainage of retroperitoneal abcess 2003  . Hypertension   . Hearing loss   . GI bleed 6/11    w/ normal EGD 6/11, 3 PRBCs   . Anemia     anemia thrombocytopenia, saw Hematology 2011, can not r/o myeloproliferative d/c   Past Surgical History  Procedure Date  . Hemicolectomy 08/29/2001    perforated abscess  . Anal fistulotomy 2002  . Partial colectomy 03/11/2012    Procedure: PARTIAL COLECTOMY;  Surgeon: Mariella Saa, MD;  Location: [facility] ORS;  Service: General;  Laterality: N/A;  subtotal colectomy transverse and left colon   . Embolectomy 03/12/2012    Procedure: EMBOLECTOMY;  Surgeon: Chuck Hint, MD;  Location: [facility] OR;  Service: Vascular;  Laterality: Right;  Right popliteal embolectomy with vein patch angioplasty, right posterior tibial embolectomy with vein patch angioplasty   . Intraoperative arteriogram 03/12/2012    Procedure: INTRA OPERATIVE ARTERIOGRAM;  Surgeon: Chuck Hint, MD;  Location: [facility] OR;  Service: Vascular;  Laterality: Right;    Current Labs:   Basename 05/16/12 0510 05/15/12 0515 05/14/12 1005  HGB -- 11.3* --  HCT -- 34.9* --  PLT -- 400 --  LABPROT 18.2*  16.6* 15.3*  INR 1.56* 1.38 1.23   Lab Results  Component Value Date   INR 1.56* 05/16/2012   INR 1.38 05/15/2012   INR 1.23 05/14/2012   Estimated Creatinine Clearance: 92.3 ml/min (by C-G formula based on Cr of 0.8).  Pertinent Current Medication: azaTHIOprine  50 mg  Oral  Daily   May decrease warfarin response      enoxaparin (LOVENOX) injection  60 mg  SQ  Q12H   warfarin  12.5 mg  Oral  ONCE-1800    Assessment:  INR is slowly responding following increased Coumadin doses.  INR 1.38  >  1.56  Patient remains on Azathiaprine which may reduce the INR response.  Renal function is stable.  No evidence of bleeding complications.  Goals:  Target INR of 2-3.  Plan:  Will repeat Coumadin 15 mg po today.  If patient discharged, will give prior to discharge.  Continue Full dose Lovenox bridge 60 mg sq q 12 hours until INR therapeutic for 24 - 48 hours.  Daily INR's, CBC. Coumadin discharge education completed.   Calton Harshfield, Elisha Headland, Pharm. D. 05/16/2012, 9:38 AM

## 2012-05-16 NOTE — Discharge Summary (Signed)
Agree with plans for D/C  Di Kindle. Edilia Bo, MD, FACS Beeper [phone] 05/16/2012

## 2012-05-17 ENCOUNTER — Ambulatory Visit (INDEPENDENT_AMBULATORY_CARE_PROVIDER_SITE_OTHER): Payer: No Typology Code available for payment source | Admitting: Internal Medicine

## 2012-05-17 DIAGNOSIS — I82409 Acute embolism and thrombosis of unspecified deep veins of unspecified lower extremity: Secondary | ICD-10-CM

## 2012-05-17 LAB — POCT INR: INR: 2.01

## 2012-05-21 NOTE — Patient Instructions (Signed)
INR 2.0 Please find out current Coumadin dose. If he is on Lovenox, he can ahead and stop. Let me know JP

## 2012-05-24 ENCOUNTER — Ambulatory Visit (INDEPENDENT_AMBULATORY_CARE_PROVIDER_SITE_OTHER): Payer: No Typology Code available for payment source | Admitting: General Practice

## 2012-05-24 LAB — POCT INR: INR: 1.7

## 2012-05-31 ENCOUNTER — Encounter (INDEPENDENT_AMBULATORY_CARE_PROVIDER_SITE_OTHER): Payer: Self-pay | Admitting: General Surgery

## 2012-05-31 ENCOUNTER — Ambulatory Visit (INDEPENDENT_AMBULATORY_CARE_PROVIDER_SITE_OTHER): Payer: No Typology Code available for payment source | Admitting: General Surgery

## 2012-05-31 VITALS — BP 136/76 | HR 78 | Temp 97.7°F | Resp 18 | Ht 69.0 in | Wt 135.4 lb

## 2012-05-31 DIAGNOSIS — K5010 Crohn's disease of large intestine without complications: Secondary | ICD-10-CM

## 2012-05-31 MED ORDER — HYDROMORPHONE HCL 2 MG PO TABS: 2.0000 mg | ORAL_TABLET | ORAL | Status: DC | PRN

## 2012-05-31 MED ORDER — FENTANYL 50 MCG/HR TD PT72: 1.0000 | MEDICATED_PATCH | TRANSDERMAL | Status: DC

## 2012-05-31 NOTE — Progress Notes (Signed)
History: Patient returns for more long-term followup status post subtotal colectomy and end ileostomy for severe Crohn's colitis.  He also developed embolic occlusion of his right lower extremity and hand embolectomy and has subsequently since his last visit had a transmetatarsal amputation on the right. He is eating without difficulty. His weight is up 12 pounds. Colostomy is functioning well.  Exam: BP 136/76  Pulse 78  Temp 97.7 F (36.5 C) (Oral)  Resp 18  Ht 5\' 9"  (1.753 m)  Wt 135 lb 6.4 oz (61.417 kg)  BMI 20.00 kg/m2 General: Appears well Abdomen: Midline wound almost completely healed with just a thin strip of clean granulation tissue. Soft and nontender and nondistended. Extremities: His right transmetatarsal wound is healing nicely without infection.  Assessment and plan: Doing well following total of abnormal colectomy with improved nutritional status and no complications identified. He requested a refill of his fentanyl patch because of pain in his foot and I refilled this #10. Continue current wound care. I will see him back in 2 months.

## 2012-05-31 NOTE — Addendum Note (Signed)
Addended by: Glenna Fellows T on: 05/31/2012 03:35 PM   Modules accepted: Orders

## 2012-06-02 ENCOUNTER — Telehealth: Payer: Self-pay | Admitting: Internal Medicine

## 2012-06-02 NOTE — Telephone Encounter (Signed)
Due for INR either here or at home per [facility]

## 2012-06-03 ENCOUNTER — Ambulatory Visit (INDEPENDENT_AMBULATORY_CARE_PROVIDER_SITE_OTHER): Payer: No Typology Code available for payment source | Admitting: General Practice

## 2012-06-03 LAB — POCT INR: INR: 1.7

## 2012-06-03 NOTE — Telephone Encounter (Signed)
lmovm for pt to return call.  

## 2012-06-04 ENCOUNTER — Other Ambulatory Visit: Payer: Self-pay | Admitting: Internal Medicine

## 2012-06-04 ENCOUNTER — Telehealth: Payer: Self-pay

## 2012-06-04 NOTE — Telephone Encounter (Signed)
Duplicate phone note.

## 2012-06-04 NOTE — Telephone Encounter (Signed)
Advise patient, as long as another physician is looking after his Coumadin, then is ok with me

## 2012-06-04 NOTE — Telephone Encounter (Signed)
Pt father called in concerning pt coumadin appt.and left message on triage line advising pt doesn't need appt someone from [place] comes to the house to check pt coumadin level.  PLz advise pt     MW  CB: [phone]

## 2012-06-04 NOTE — Telephone Encounter (Signed)
Duplicate phone note:  Mal [place], [place] 06/04/2012 10:22 AM Signed  Pt father called in concerning pt coumadin appt.and left message on triage line advising pt doesn't need appt someone from [place] comes to the house to check pt coumadin level. PLz advise pt MW  CB: [phone]

## 2012-06-05 NOTE — Telephone Encounter (Signed)
Refill done.  

## 2012-06-11 ENCOUNTER — Telehealth: Payer: Self-pay | Admitting: Internal Medicine

## 2012-06-11 ENCOUNTER — Other Ambulatory Visit: Payer: Self-pay | Admitting: Internal Medicine

## 2012-06-11 ENCOUNTER — Ambulatory Visit (INDEPENDENT_AMBULATORY_CARE_PROVIDER_SITE_OTHER): Payer: No Typology Code available for payment source | Admitting: General Practice

## 2012-06-11 DIAGNOSIS — Z7901 Long term (current) use of anticoagulants: Secondary | ICD-10-CM

## 2012-06-11 NOTE — Telephone Encounter (Signed)
yes

## 2012-06-11 NOTE — Telephone Encounter (Signed)
Refill done.  

## 2012-06-11 NOTE — Telephone Encounter (Signed)
Pt wants to switch PCP from Dr. Drue Novel to Dr. Yetta Barre.  Will this be OK?

## 2012-06-14 NOTE — Telephone Encounter (Signed)
yes

## 2012-06-18 ENCOUNTER — Encounter: Payer: Self-pay | Admitting: Vascular Surgery

## 2012-06-18 NOTE — Telephone Encounter (Signed)
LMOM at home to call for an appt with Dr. Yetta Barre.

## 2012-06-20 ENCOUNTER — Telehealth (INDEPENDENT_AMBULATORY_CARE_PROVIDER_SITE_OTHER): Payer: Self-pay | Admitting: General Surgery

## 2012-06-20 ENCOUNTER — Telehealth: Payer: Self-pay
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
## 2012-06-25 ENCOUNTER — Encounter: Payer: Self-pay | Admitting: Vascular Surgery

## 2012-06-26 ENCOUNTER — Ambulatory Visit (INDEPENDENT_AMBULATORY_CARE_PROVIDER_SITE_OTHER): Payer: No Typology Code available for payment source | Admitting: Vascular Surgery

## 2012-06-26 ENCOUNTER — Ambulatory Visit (INDEPENDENT_AMBULATORY_CARE_PROVIDER_SITE_OTHER): Payer: No Typology Code available for payment source | Admitting: General Practice

## 2012-06-26 ENCOUNTER — Encounter: Payer: Self-pay | Admitting: Vascular Surgery

## 2012-06-26 VITALS — BP 139/91 | HR 54 | Ht 69.0 in | Wt 143.2 lb

## 2012-06-26 DIAGNOSIS — I82409 Acute embolism and thrombosis of unspecified deep veins of unspecified lower extremity: Secondary | ICD-10-CM

## 2012-06-26 DIAGNOSIS — I743 Embolism and thrombosis of arteries of the lower extremities: Secondary | ICD-10-CM

## 2012-06-26 LAB — POCT INR: INR: 1.8

## 2012-06-26 NOTE — Progress Notes (Signed)
[facility] [place]  Patient name: Rick Edwards MRN: [id number] DOB: April 01, 1956 Sex: male  REASON FOR VISIT: follow up after right transmetatarsal amputation on 05/10/2012.  HPI: Rick Edwards is a 56 y.o. male who had developed an ischemic foot after undergoing colectomy for Crohn's disease. He underwent a right popliteal and tibial peroneal trunk embolectomy with vein patch angioplasty. Postoperatively he was found to have an anti-thrombin 3 deficiency and has been on Coumadin since that time. He had dry gangrene of the toes of the right foot and on 05/10/2012 underwent transmetatarsal amputation. He comes in for a routine follow up visit. He has been ambulatory with his Darko shoe. He has not had any significant pain in the right foot.   REVIEW OF SYSTEMS: Arly.Keller ] denotes positive finding; [  ] denotes negative finding  CARDIOVASCULAR:  [ ]  chest pain   [ ]  dyspnea on exertion    CONSTITUTIONAL:  [ ]  fever   [ ]  chills  PHYSICAL EXAM: Filed Vitals:   06/26/12 1016  BP: 139/91  Pulse: 54  Height: 5\' 9"  (1.753 m)  Weight: 143 lb 3.2 oz (64.955 kg)  SpO2: 98%   Body mass index is 21.15 kg/(m^2). GENERAL: The patient is a well-nourished male, in no acute distress. The vital signs are documented above. CARDIOVASCULAR: There is a regular rate and rhythm  PULMONARY: There is good air exchange bilaterally without wheezing or rales. The right transmetatarsal amputation site is healing nicely. The right foot is warm and well-perfused. He has a palpable right popliteal pulse.  MEDICAL ISSUES: His right transmetatarsal amputation site is healing nicely. We removed his staples in the office today. I'll see him back in 3 months. He knows to call sooner if he has problems.  DICKSON,CHRISTOPHER S [facility] [place] Beeper: [phone]

## 2012-06-28 ENCOUNTER — Telehealth: Payer: Self-pay | Admitting: *Deleted

## 2012-06-28 NOTE — Telephone Encounter (Signed)
Called requesting pain medication. Referred to previous note of Carol/Dr Edilia Bo and explained we would no longer provide narcotic for pain. If in severe pain should report to ED

## 2012-07-08 ENCOUNTER — Other Ambulatory Visit: Payer: Self-pay | Admitting: Internal Medicine

## 2012-07-08 NOTE — Telephone Encounter (Signed)
?   PCP change. Please advise    KP

## 2012-07-09 NOTE — Telephone Encounter (Signed)
1. Coumadin refill needs to be done through the Coumadin clinic, I can't refill 2. Aldactone #30, no refills, any further refills per new PCP Dr. Yetta Barre

## 2012-07-09 NOTE — Telephone Encounter (Signed)
Done

## 2012-07-10 ENCOUNTER — Other Ambulatory Visit: Payer: Self-pay | Admitting: General Practice

## 2012-07-10 MED ORDER — AMLODIPINE BESYLATE 5 MG PO TABS: 5.0000 mg | ORAL_TABLET | Freq: Every morning | ORAL | Status: DC

## 2012-07-10 MED ORDER — WARFARIN SODIUM 5 MG PO TABS: 5.0000 mg | ORAL_TABLET | Freq: Every day | ORAL | Status: DC

## 2012-07-12 ENCOUNTER — Other Ambulatory Visit (HOSPITAL_BASED_OUTPATIENT_CLINIC_OR_DEPARTMENT_OTHER): Payer: No Typology Code available for payment source | Admitting: Lab

## 2012-07-12 DIAGNOSIS — D649 Anemia, unspecified: Secondary | ICD-10-CM

## 2012-07-12 LAB — CBC WITH DIFFERENTIAL/PLATELET
Basophils Absolute: 0 10*3/uL (ref 0.0–0.1)
EOS%: 2.4 % (ref 0.0–7.0)
Eosinophils Absolute: 0.1 10*3/uL (ref 0.0–0.5)
HCT: 38.4 % (ref 38.4–49.9)
HGB: 13.2 g/dL (ref 13.0–17.1)
MCH: 30.6 pg (ref 27.2–33.4)
MCV: 88.9 fL (ref 79.3–98.0)
NEUT#: 3.1 10*3/uL (ref 1.5–6.5)
NEUT%: 63.4 % (ref 39.0–75.0)
RDW: 16.7 % — ABNORMAL HIGH (ref 11.0–14.6)
lymph#: 1.3 10*3/uL (ref 0.9–3.3)

## 2012-07-13 LAB — IRON AND TIBC
%SAT: 24 % (ref 20–55)
Iron: 69 ug/dL (ref 42–165)
UIBC: 222 ug/dL (ref 125–400)

## 2012-07-17 ENCOUNTER — Ambulatory Visit (INDEPENDENT_AMBULATORY_CARE_PROVIDER_SITE_OTHER): Payer: No Typology Code available for payment source | Admitting: General Practice

## 2012-07-17 ENCOUNTER — Ambulatory Visit (INDEPENDENT_AMBULATORY_CARE_PROVIDER_SITE_OTHER): Payer: No Typology Code available for payment source

## 2012-07-17 DIAGNOSIS — I82409 Acute embolism and thrombosis of unspecified deep veins of unspecified lower extremity: Secondary | ICD-10-CM

## 2012-07-17 DIAGNOSIS — Z23 Encounter for immunization: Secondary | ICD-10-CM

## 2012-07-17 LAB — POCT INR: INR: 1.9

## 2012-07-18 ENCOUNTER — Telehealth: Payer: Self-pay | Admitting: Internal Medicine

## 2012-07-18 ENCOUNTER — Ambulatory Visit (HOSPITAL_BASED_OUTPATIENT_CLINIC_OR_DEPARTMENT_OTHER): Payer: No Typology Code available for payment source | Admitting: Internal Medicine

## 2012-07-18 VITALS — BP 110/72 | HR 84 | Temp 97.2°F | Resp 18 | Ht 69.0 in | Wt 150.8 lb

## 2012-07-18 DIAGNOSIS — D509 Iron deficiency anemia, unspecified: Secondary | ICD-10-CM

## 2012-07-18 DIAGNOSIS — D649 Anemia, unspecified: Secondary | ICD-10-CM

## 2012-07-18 NOTE — Patient Instructions (Signed)
Your labwork is good with no evidence for anemia or iron deficiency. Continue on observation for now with followup visit in 6 months.

## 2012-07-18 NOTE — Telephone Encounter (Signed)
gv and printed appt schedule for pt for June 2014 ° °

## 2012-07-18 NOTE — Progress Notes (Signed)
[facility] [phone]   [phone]  OFFICE PROGRESS NOTE  Oliver Barre, MD [address] [facility] [address] [place] [address] [place] [address]  PRINCIPAL DIAGNOSIS:  1) Iron deficiency anemia with reactive thrombocytosis.  2) Crohn's colitis with obstruction status post colectomy with end ileostomy and repair enterotomy under the care of Dr. 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 5) dry gangrene of the toes of the right foot, status post transmetatarsal amputation on 05/10/2012.  PRIOR THERAPY: The patient is status post intravenous Feraheme infusion. Last dose was given in June 2012.   CURRENT THERAPY: Observation.  INTERVAL HISTORY: Rick Edwards 56 y.o. male returns to the clinic today for routine 4 month followup visit. The patient is feeling fine today with no specific complaints. He underwent transmetatarsal amputation of the right foot on 05/10/2012 for dry gangrene of the toes. He is feeling fine and recovering well from his surgery. The patient had repeat CBC and iron study recently and he is here today for evaluation and discussion of his lab results. He denied having any significant fatigue or weakness. He denied having any chest pain, shortness breath, cough or hemoptysis. He has no bleeding issues.  MEDICAL HISTORY: Past Medical History  Diagnosis Date  . Crohn's disease   . Perianal abscess 2001    s/p right hemicolectomy, and drainage of retroperitoneal abcess 2003  . Hypertension   . Hearing loss   . GI bleed 6/11    w/ normal EGD 6/11, 3 PRBCs   . Anemia     anemia thrombocytopenia, saw Hematology 2011, can not r/o myeloproliferative d/c    ALLERGIES:  is allergic to mesalamine.  MEDICATIONS:  Current Outpatient Prescriptions  Medication  Sig Dispense Refill  . amLODipine (NORVASC) 5 MG tablet Take 1 tablet (5 mg total) by mouth every morning.  30 tablet  0  . azaTHIOprine (IMURAN) 50 MG tablet Take 1 and 1/2 tab      . fentaNYL (DURAGESIC) 50 MCG/HR Place 1 patch (50 mcg total) onto the skin every 3 (three) days.  10 patch  0  . HYDROmorphone (DILAUDID) 2 MG tablet Take 1 tablet (2 mg total) by mouth every 3 (three) hours as needed.  40 tablet  0  . spironolactone (ALDACTONE) 25 MG tablet TAKE 1 TABLET (25 MG TOTAL) BY MOUTH EVERY MORNING.  30 tablet  0  . warfarin (COUMADIN) 2.5 MG tablet Take 7.5 mg by mouth daily. 7.5 mg Qd None on Sunday      . warfarin (COUMADIN) 2.5 MG tablet As directed  30 tablet  0  . warfarin (COUMADIN) 2.5 MG tablet TAKE AS DIRECTED  30 tablet  0  . warfarin (COUMADIN) 5 MG tablet Take 7.5 mg by mouth daily. 7.5mg  QD except Sunday none      . warfarin (COUMADIN) 5 MG tablet As directed  30 tablet  0  . warfarin (COUMADIN) 5 MG tablet TAKE AS DIRECTED  30 tablet  0  . warfarin (COUMADIN) 5 MG tablet Take 1 tablet (5 mg total) by mouth daily. Take as directed by coumadin clinic.  50 tablet  2    SURGICAL HISTORY:  Past Surgical History  Procedure Date  . Hemicolectomy 08/29/2001    perforated abscess  . Anal fistulotomy 2002  .  Partial colectomy 03/11/2012    Procedure: PARTIAL COLECTOMY;  Surgeon: Mariella Saa, MD;  Location: [facility];  Service: General;  Laterality: N/A;  subtotal colectomy transverse and left colon   . Embolectomy 03/12/2012    Procedure: EMBOLECTOMY;  Surgeon: Chuck Hint, MD;  Location: [facility] OR;  Service: Vascular;  Laterality: Right;  Right popliteal embolectomy with vein patch angioplasty, right posterior tibial embolectomy with vein patch angioplasty   . Intraoperative arteriogram 03/12/2012    Procedure: INTRA OPERATIVE ARTERIOGRAM;  Surgeon: Chuck Hint, MD;  Location: [facility] OR;  Service: Vascular;  Laterality: Right;  . Transmetatarsal amputation  05/10/2012    right    REVIEW OF SYSTEMS:  A comprehensive review of systems was negative.   PHYSICAL EXAMINATION: General appearance: alert, cooperative and no distress Head: Normocephalic, without obvious abnormality, atraumatic Neck: no adenopathy Resp: clear to auscultation bilaterally Cardio: regular rate and rhythm, S1, S2 normal, no murmur, click, rub or gallop GI: soft, non-tender; bowel sounds normal; no masses,  no organomegaly Extremities: extremities normal, atraumatic, no cyanosis or edema  ECOG PERFORMANCE STATUS: 0 - Asymptomatic  Blood pressure 110/72, pulse 84, temperature 97.2 F (36.2 C), temperature source Oral, resp. rate 18, height 5\' 9"  (1.753 m), weight 150 lb 12.8 oz (68.402 kg).  LABORATORY DATA: Lab Results  Component Value Date   WBC 4.9 07/12/2012   HGB 13.2 07/12/2012   HCT 38.4 07/12/2012   MCV 88.9 07/12/2012   PLT 342 07/12/2012      Chemistry      Component Value Date/Time   NA 141 05/06/2012 0910   K 3.6 05/06/2012 0910   CL 104 05/06/2012 0910   CO2 29 03/24/2012 0515   BUN 8 05/06/2012 0910   CREATININE 0.80 05/06/2012 0910      Component Value Date/Time   CALCIUM 8.0* 03/24/2012 0515   ALKPHOS 256* 03/21/2012 0500   AST 23 03/21/2012 0500   ALT 38 03/21/2012 0500   BILITOT 0.4 03/21/2012 0500       RADIOGRAPHIC STUDIES: No results found.  ASSESSMENT: This is a very pleasant 56 years old African American male with history of iron deficiency anemia status post intravenous Feraheme infusion in June of 2012 and the patient has been observation since that time with improvement in his hemoglobin and hematocrit. The patient has no evidence of anemia or iron deficiency on his current blood work.  PLAN: I recommended for him to continue on observation for now. I would see him back for followup visit in 6 months with repeat CBC and iron study. He was advised to call immediately if he has any concerning symptoms in the interval.  All questions were  answered. The patient knows to call the clinic with any problems, questions or concerns. We can certainly see the patient much sooner if necessary.

## 2012-08-02 ENCOUNTER — Ambulatory Visit (INDEPENDENT_AMBULATORY_CARE_PROVIDER_SITE_OTHER): Payer: No Typology Code available for payment source | Admitting: General Surgery

## 2012-08-02 ENCOUNTER — Encounter (INDEPENDENT_AMBULATORY_CARE_PROVIDER_SITE_OTHER): Payer: Self-pay | Admitting: General Surgery

## 2012-08-02 VITALS — BP 142/82 | HR 76 | Temp 97.8°F | Resp 12 | Ht 69.0 in | Wt 154.8 lb

## 2012-08-02 DIAGNOSIS — K5010 Crohn's disease of large intestine without complications: Secondary | ICD-10-CM

## 2012-08-02 NOTE — Progress Notes (Signed)
History: Patient returns now about 3 months following partial colectomy and ileostomy for severe Crohn's disease of the colon with obstruction. He continues to do very well. He has gained weight. He has no bowel pain or difficulty eating. Ileostomy is functioning well.  Exam: BP 142/82  Pulse 76  Temp 97.8 F (36.6 C) (Oral)  Resp 12  Ht 5\' 9"  (1.753 m)  Wt 154 lb 12.8 oz (70.217 kg)  BMI 22.86 kg/m2 General: Healthy-appearing Abdomen: Soft and nontender. His wound is well healed. Stoma appears healthy.  Assessment and plan: Doing well following her partial colectomy for severe Crohn's colitis with obstruction. He is interested in ultimately having GI continuity back. I will see him back in 6 months and at that point we will need to have a thorough GI workup prior to considering taking down his ileostomy.

## 2012-08-13 ENCOUNTER — Other Ambulatory Visit: Payer: Self-pay | Admitting: Internal Medicine

## 2012-08-14 ENCOUNTER — Ambulatory Visit (INDEPENDENT_AMBULATORY_CARE_PROVIDER_SITE_OTHER): Payer: No Typology Code available for payment source | Admitting: General Practice

## 2012-08-14 DIAGNOSIS — I82409 Acute embolism and thrombosis of unspecified deep veins of unspecified lower extremity: Secondary | ICD-10-CM

## 2012-08-14 LAB — POCT INR: INR: 1.9

## 2012-08-15 ENCOUNTER — Encounter: Payer: Self-pay | Admitting: Internal Medicine

## 2012-08-15 ENCOUNTER — Ambulatory Visit (INDEPENDENT_AMBULATORY_CARE_PROVIDER_SITE_OTHER): Payer: No Typology Code available for payment source | Admitting: Internal Medicine

## 2012-08-15 ENCOUNTER — Other Ambulatory Visit (INDEPENDENT_AMBULATORY_CARE_PROVIDER_SITE_OTHER): Payer: No Typology Code available for payment source

## 2012-08-15 VITALS — BP 160/98 | HR 78 | Temp 98.0°F | Resp 16 | Ht 69.0 in | Wt 166.5 lb

## 2012-08-15 DIAGNOSIS — R972 Elevated prostate specific antigen [PSA]: Secondary | ICD-10-CM

## 2012-08-15 DIAGNOSIS — I10 Essential (primary) hypertension: Secondary | ICD-10-CM

## 2012-08-15 LAB — BASIC METABOLIC PANEL
Calcium: 9.2 mg/dL (ref 8.4–10.5)
Creatinine, Ser: 0.7 mg/dL (ref 0.4–1.5)
GFR: 164.53 mL/min (ref >60.00)
Glucose, Bld: 98 mg/dL (ref 70–99)
Sodium: 138 mEq/L (ref 135–145)

## 2012-08-15 MED ORDER — OLMESARTAN-AMLODIPINE-HCTZ 40-10-25 MG PO TABS: 1.0000 | ORAL_TABLET | Freq: Every day | ORAL | Status: DC

## 2012-08-15 NOTE — Progress Notes (Signed)
Subjective:    Patient ID: Rick Edwards, male    DOB: October 22, 1955, 57 y.o.   MRN: [id number]  Hypertension This is a chronic problem. The current episode started more than 1 year ago. The problem has been gradually worsening since onset. The problem is uncontrolled. Pertinent negatives include no anxiety, blurred vision, chest pain, headaches, malaise/fatigue, neck pain, orthopnea, palpitations, peripheral edema, PND, shortness of breath or sweats. Past treatments include calcium channel blockers. The current treatment provides mild improvement. Compliance problems include exercise, diet and psychosocial issues.       Review of Systems  Constitutional: Negative for fever, chills, malaise/fatigue, diaphoresis, activity change, appetite change, fatigue and unexpected weight change.  HENT: Negative.  Negative for neck pain.   Eyes: Negative.  Negative for blurred vision.  Respiratory: Negative for cough, chest tightness, shortness of breath, wheezing and stridor.   Cardiovascular: Negative for chest pain, palpitations, orthopnea, leg swelling and PND.  Gastrointestinal: Negative for nausea, vomiting, abdominal pain, diarrhea, constipation and blood in stool.  Genitourinary: Negative for dysuria, urgency, frequency, hematuria, flank pain, decreased urine volume, enuresis and difficulty urinating.  Musculoskeletal: Negative for myalgias, back pain, joint swelling, arthralgias and gait problem.  Skin: Negative.   Neurological: Negative for dizziness, tremors, weakness, light-headedness and headaches.  Hematological: Negative for adenopathy. Does not bruise/bleed easily.  Psychiatric/Behavioral: Negative.        Objective:   Physical Exam  Vitals reviewed. Constitutional: He is oriented to person, place, and time. He appears well-developed and well-nourished. No distress.  HENT:  Head: Normocephalic and atraumatic.  Mouth/Throat: Oropharynx is clear and moist. No oropharyngeal exudate.    Eyes: Conjunctivae normal are normal. Right eye exhibits no discharge. Left eye exhibits no discharge. No scleral icterus.  Neck: Normal range of motion. Neck supple. No JVD present. No tracheal deviation present. No thyromegaly present.  Cardiovascular: Normal rate, regular rhythm, normal heart sounds and intact distal pulses.  Exam reveals no gallop and no friction rub.   No murmur heard. Pulmonary/Chest: Effort normal and breath sounds normal. No stridor. No respiratory distress. He has no wheezes. He has no rales. He exhibits no tenderness.  Abdominal: Soft. Normal appearance and bowel sounds are normal. He exhibits no shifting dullness, no distension, no pulsatile liver, no fluid wave, no abdominal bruit, no ascites, no pulsatile midline mass and no mass. There is no hepatosplenomegaly, splenomegaly or hepatomegaly. There is no tenderness. There is no rigidity, no rebound, no guarding, no CVA tenderness, no tenderness at McBurney's point and negative Murphy's sign. No hernia. Hernia confirmed negative in the ventral area, confirmed negative in the right inguinal area and confirmed negative in the left inguinal area.    Musculoskeletal: Normal range of motion. He exhibits no edema and no tenderness.  Lymphadenopathy:    He has no cervical adenopathy.  Neurological: He is oriented to person, place, and time.  Skin: Skin is warm and dry. No rash noted. He is not diaphoretic. No erythema. No pallor.  Psychiatric: He has a normal mood and affect. His behavior is normal. Judgment and thought content normal.      Lab Results  Component Value Date   WBC 4.9 07/12/2012   HGB 13.2 07/12/2012   HCT 38.4 07/12/2012   PLT 342 07/12/2012   GLUCOSE 95 05/06/2012   CHOL 129 03/18/2012   TRIG 186* 03/18/2012   HDL 42.90 09/08/2011   LDLCALC 137* 09/08/2011   ALT 38 03/21/2012   AST 23 03/21/2012   NA 141  05/06/2012   K 3.6 05/06/2012   CL 104 05/06/2012   CREATININE 0.80 05/06/2012   BUN 8 05/06/2012   CO2  29 03/24/2012   TSH 1.31 09/08/2011   PSA 4.87* 09/08/2011   INR 1.9 08/14/2012  RADIOLOGY REPORT*  Clinical Data: Preoperative radiograph  CHEST - 2 VIEW  Comparison: 03/20/2012 CT  Findings: Hilar prominence corresponds to a prominent vessels on  the comparison CT heart size within normal limits. Linear left  lung base opacities in keeping with scarring versus atelectasis.  No pleural effusion or pneumothorax. No acute osseous finding.  IMPRESSION:  Linear retrocardiac opacity; scarring versus atelectasis.  Hilar prominence corresponds to prominent pulmonary arteries on the  recent CT as can be seen in the setting of pulmonary arterial  hypertension.  Original Report Authenticated By: Waneta Martins, M.D.      Assessment & Plan:

## 2012-08-15 NOTE — Assessment & Plan Note (Signed)
I will recheck his PSA today If it continues to rise will refer him to urology

## 2012-08-15 NOTE — Assessment & Plan Note (Signed)
His BP is not well controlled so I have upgraded his meds to tribenzor I will check his lytes and renal function today

## 2012-08-15 NOTE — Patient Instructions (Signed)

## 2012-09-11 ENCOUNTER — Ambulatory Visit (INDEPENDENT_AMBULATORY_CARE_PROVIDER_SITE_OTHER): Payer: No Typology Code available for payment source | Admitting: General Practice

## 2012-09-11 DIAGNOSIS — I82409 Acute embolism and thrombosis of unspecified deep veins of unspecified lower extremity: Secondary | ICD-10-CM

## 2012-09-24 ENCOUNTER — Encounter: Payer: Self-pay | Admitting: Vascular Surgery

## 2012-09-25 ENCOUNTER — Ambulatory Visit (INDEPENDENT_AMBULATORY_CARE_PROVIDER_SITE_OTHER): Payer: No Typology Code available for payment source | Admitting: Vascular Surgery

## 2012-09-25 ENCOUNTER — Encounter: Payer: Self-pay | Admitting: Vascular Surgery

## 2012-09-25 VITALS — BP 113/81 | HR 84 | Ht 69.0 in | Wt 174.0 lb

## 2012-09-25 DIAGNOSIS — I743 Embolism and thrombosis of arteries of the lower extremities: Secondary | ICD-10-CM

## 2012-09-25 NOTE — Progress Notes (Signed)
[facility] [place]  Patient name: Rick Edwards MRN: [id number] DOB: 05/23/1956 Sex: male  REASON FOR VISIT: Follow up of peripheral vascular disease.  HPI: Rick Edwards is a 57 y.o. male who underwent a right popliteal and tibial embolectomy when he developed an ischemic foot after: Surgery. He was found to have an anti-thrombin 3 deficiency and has been on Coumadin. He ultimately required transmetatarsal amputation of the right foot on 05/10/2012. He comes in for a routine follow up visit. He has been ambulating without problems. He denies claudication, rest pain, or any ulcers the left foot.   REVIEW OF SYSTEMS: Arly.Keller ] denotes positive finding; [  ] denotes negative finding  CARDIOVASCULAR:  [ ]  chest pain   [ ]  dyspnea on exertion    CONSTITUTIONAL:  [ ]  fever   [ ]  chills  PHYSICAL EXAM: Filed Vitals:   09/25/12 0905  BP: 113/81  Pulse: 84  Height: 5\' 9"  (1.753 m)  Weight: 174 lb (78.926 kg)  SpO2: 100%   Body mass index is 25.68 kg/(m^2). GENERAL: The patient is a well-nourished male, in no acute distress. The vital signs are documented above. CARDIOVASCULAR: There is a regular rate and rhythm. He has no lower extremity swelling.  PULMONARY: There is good air exchange bilaterally without wheezing or rales. He has a palpable right posterior tibial pulse. His transmetatarsal amputation site is healed. The left foot is warm and well-perfused without ischemic ulcers.  MEDICAL ISSUES: The patient's right transmetatarsal amputation site is healed nicely. I have written him a prescription for a right TMA insert and have referred him to [employer]. I've encouraged him to stay as active as possible. I'll plan on seeing him back in one year. He knows to call sooner if he has problems.  DICKSON,CHRISTOPHER S [facility] [place] [facility]: [phone]

## 2012-10-09 ENCOUNTER — Ambulatory Visit (INDEPENDENT_AMBULATORY_CARE_PROVIDER_SITE_OTHER): Payer: No Typology Code available for payment source | Admitting: General Practice

## 2012-10-09 DIAGNOSIS — I82409 Acute embolism and thrombosis of unspecified deep veins of unspecified lower extremity: Secondary | ICD-10-CM

## 2012-10-09 LAB — POCT INR: INR: 3.3

## 2012-10-15 ENCOUNTER — Other Ambulatory Visit: Payer: Self-pay | Admitting: Internal Medicine

## 2012-10-16 ENCOUNTER — Ambulatory Visit: Payer: No Typology Code available for payment source | Admitting: Internal Medicine

## 2012-11-04 ENCOUNTER — Other Ambulatory Visit: Payer: Self-pay | Admitting: General Practice

## 2012-11-04 MED ORDER — WARFARIN SODIUM 5 MG PO TABS: 5.0000 mg | ORAL_TABLET | Freq: Every day | ORAL | Status: DC

## 2012-11-06 ENCOUNTER — Ambulatory Visit (INDEPENDENT_AMBULATORY_CARE_PROVIDER_SITE_OTHER): Payer: No Typology Code available for payment source | Admitting: General Practice

## 2012-11-06 DIAGNOSIS — I82409 Acute embolism and thrombosis of unspecified deep veins of unspecified lower extremity: Secondary | ICD-10-CM

## 2012-11-06 DIAGNOSIS — Z7901 Long term (current) use of anticoagulants: Secondary | ICD-10-CM

## 2012-11-06 DIAGNOSIS — I743 Embolism and thrombosis of arteries of the lower extremities: Secondary | ICD-10-CM

## 2012-11-25 ENCOUNTER — Telehealth: Payer: Self-pay | Admitting: Internal Medicine

## 2012-11-25 DIAGNOSIS — I10 Essential (primary) hypertension: Secondary | ICD-10-CM

## 2012-11-25 MED ORDER — OLMESARTAN-AMLODIPINE-HCTZ 40-10-25 MG PO TABS: 1.0000 | ORAL_TABLET | Freq: Every day | ORAL | Status: DC

## 2012-11-25 NOTE — Telephone Encounter (Signed)
Needs samples of tribenzor 40/10/25

## 2012-11-25 NOTE — Telephone Encounter (Signed)
Samples upfront. Pt's mother informed.

## 2012-11-27 DIAGNOSIS — Z7901 Long term (current) use of anticoagulants: Secondary | ICD-10-CM | POA: Insufficient documentation

## 2012-12-04 ENCOUNTER — Ambulatory Visit (INDEPENDENT_AMBULATORY_CARE_PROVIDER_SITE_OTHER): Payer: No Typology Code available for payment source | Admitting: General Practice

## 2012-12-04 DIAGNOSIS — I743 Embolism and thrombosis of arteries of the lower extremities: Secondary | ICD-10-CM

## 2012-12-04 DIAGNOSIS — Z7901 Long term (current) use of anticoagulants: Secondary | ICD-10-CM

## 2012-12-24 ENCOUNTER — Telehealth: Payer: Self-pay

## 2012-12-24 NOTE — Telephone Encounter (Signed)
Phone call back to pt's dad letting him know samples of Tribenzor 40/10/25 mg. I let his dad know samples are available and will be at the front desk for pick up. He states he will be by tomorrow to get the samples. A note was also placed in the back stating pt needs to take 2 tablets daily instead of 1 since the sample dose available was 20/5/12.5 mg and to call with any questions.

## 2012-12-29 ENCOUNTER — Encounter (HOSPITAL_COMMUNITY): Payer: Self-pay

## 2012-12-29 ENCOUNTER — Emergency Department (HOSPITAL_COMMUNITY)
Admission: EM | Admit: 2012-12-29 | Discharge: 2012-12-29 | Disposition: A | Discharge status: Home / Self Care | Payer: No Typology Code available for payment source | Attending: Emergency Medicine | Admitting: Emergency Medicine

## 2012-12-29 ENCOUNTER — Emergency Department (HOSPITAL_COMMUNITY): Payer: No Typology Code available for payment source

## 2012-12-29 DIAGNOSIS — Z8719 Personal history of other diseases of the digestive system: Secondary | ICD-10-CM | POA: Insufficient documentation

## 2012-12-29 DIAGNOSIS — H9190 Unspecified hearing loss, unspecified ear: Secondary | ICD-10-CM | POA: Insufficient documentation

## 2012-12-29 DIAGNOSIS — Z87891 Personal history of nicotine dependence: Secondary | ICD-10-CM | POA: Insufficient documentation

## 2012-12-29 DIAGNOSIS — D649 Anemia, unspecified: Secondary | ICD-10-CM | POA: Insufficient documentation

## 2012-12-29 DIAGNOSIS — I10 Essential (primary) hypertension: Secondary | ICD-10-CM | POA: Insufficient documentation

## 2012-12-29 DIAGNOSIS — Z79899 Other long term (current) drug therapy: Secondary | ICD-10-CM | POA: Insufficient documentation

## 2012-12-29 DIAGNOSIS — Z888 Allergy status to other drugs, medicaments and biological substances status: Secondary | ICD-10-CM | POA: Insufficient documentation

## 2012-12-29 DIAGNOSIS — R1310 Dysphagia, unspecified: Secondary | ICD-10-CM

## 2012-12-29 NOTE — ED Notes (Signed)
Pt states that about a month ago he had difficulty swallowing, but it went away on its own.  Pt states that today he began having difficulty swallowing again.  Pt states he feels like he is having a difficult time "getting things down".

## 2012-12-29 NOTE — ED Provider Notes (Signed)
History     CSN: [id number]  Arrival date & time 12/29/12  1308   First MD Initiated Contact with Patient 12/29/12 [phone]      Chief Complaint  Patient presents with  . Dysphagia    (Consider location/radiation/quality/duration/timing/severity/associated sxs/prior treatment) HPI Pt with history of crohn's disease followed by [facility] has had a couple episodes of the sensation of food becoming stuck in his esophagus after eating over the last month. Pt states earlier today was eating breakfast and had same sensation. Pt states it has now resolved. Tolerating secretions. No N/V. Tolerating liquids. No fever, chills, abd pain.  Past Medical History  Diagnosis Date  . Crohn's disease   . Perianal abscess 2001    s/p right hemicolectomy, and drainage of retroperitoneal abcess 2003  . Hypertension   . Hearing loss   . GI bleed 6/11    w/ normal EGD 6/11, 3 PRBCs   . Anemia     anemia thrombocytopenia, saw Hematology 2011, can not r/o myeloproliferative d/c    Past Surgical History  Procedure Laterality Date  . Hemicolectomy  08/29/2001    perforated abscess  . Anal fistulotomy  2002  . Partial colectomy  03/11/2012    Procedure: PARTIAL COLECTOMY;  Surgeon: Mariella Saa, MD;  Location: [facility];  Service: General;  Laterality: N/A;  subtotal colectomy transverse and left colon   . Embolectomy  03/12/2012    Procedure: EMBOLECTOMY;  Surgeon: Chuck Hint, MD;  Location: [facility] OR;  Service: Vascular;  Laterality: Right;  Right popliteal embolectomy with vein patch angioplasty, right posterior tibial embolectomy with vein patch angioplasty   . Intraoperative arteriogram  03/12/2012    Procedure: INTRA OPERATIVE ARTERIOGRAM;  Surgeon: Chuck Hint, MD;  Location: [facility] OR;  Service: Vascular;  Laterality: Right;  . Transmetatarsal amputation  05/10/2012    right    Family History  Problem Relation Age of Onset  . Coronary artery disease Neg Hx   . Diabetes Neg Hx   .  Colon cancer Neg Hx   . Prostate cancer Neg Hx   . Stroke      GF, aunts   . Hypertension      "the whole family"  . Hypertension Mother   . Hyperlipidemia Mother   . Hypertension Father   . Hyperlipidemia Father   . Heart disease Father     History  Substance Use Topics  . Smoking status: Former Smoker -- 0.25 packs/day for 9 years    Types: Cigarettes    Quit date: 05/09/2011  . Smokeless tobacco: Never Used  . Alcohol Use: No      Review of Systems  Constitutional: Negative for fever and chills.  HENT: Positive for trouble swallowing.   Respiratory: Negative for shortness of breath.   Cardiovascular: Negative for chest pain.  Gastrointestinal: Negative for nausea, vomiting and abdominal pain.  Skin: Negative for rash.  Neurological: Negative for weakness and numbness.  All other systems reviewed and are negative.    Allergies  Mesalamine  Home Medications   Current Outpatient Rx  Name  Route  Sig  Dispense  Refill  . azaTHIOprine (IMURAN) 50 MG tablet   Oral   Take 50 mg by mouth daily.          . Olmesartan-Amlodipine-HCTZ (TRIBENZOR) 20-5-12.5 MG TABS   Oral   Take 1 tablet by mouth daily after breakfast.         . warfarin (COUMADIN) 5 MG tablet  Oral   Take 5-10 mg by mouth every evening. Take 1 1/2 (7.5 mg) tablets on Sunday, Monday, Tuesday, Thursday, and Friday. Take 2 tablets (10 mg) on Wednesday and Saturday.           BP 142/81  Pulse 84  Temp(Src) 98.4 F (36.9 C) (Oral)  Resp 20  SpO2 96%  Physical Exam  Nursing note and vitals reviewed. Constitutional: He is oriented to person, place, and time. He appears well-developed and well-nourished. No distress.  HENT:  Head: Normocephalic and atraumatic.  Mouth/Throat: Oropharynx is clear and moist.  Eyes: EOM are normal. Pupils are equal, round, and reactive to light.  Neck: Normal range of motion. Neck supple.  Cardiovascular: Normal rate and regular rhythm.   Pulmonary/Chest:  Effort normal and breath sounds normal. No stridor. No respiratory distress. He has no wheezes. He has no rales.  Abdominal: Soft. Bowel sounds are normal. He exhibits no distension and no mass. There is no tenderness. There is no rebound and no guarding.  Musculoskeletal: Normal range of motion. He exhibits no edema and no tenderness.  Neurological: He is alert and oriented to person, place, and time.  Skin: Skin is warm and dry. No rash noted. No erythema.  Psychiatric: He has a normal mood and affect. His behavior is normal.    ED Course  Procedures (including critical care time)  Labs Reviewed - No data to display Dg Chest 2 View  12/29/2012   *RADIOLOGY REPORT*  Clinical Data: Dysphagia  CHEST - 2 VIEW  Comparison: 05/10/2012  Findings: Stable chronic hilar prominence.  Increased subsegmental atelectasis at the right base.  Linear atelectasis at the medial left base improved.  Lungs remain hyperaerated.  No new area of definitive consolidation or mass.  No pneumothorax or pleural effusion.  IMPRESSION: Bibasilar atelectasis as described.   Original Report Authenticated By: Jolaine Click, M.D.     1. Dysphagia       MDM  Pt advised to only consume liquids until GI this week for possible outpt endoscopy. Return precautions given        Loren Racer, MD 12/29/12 [phone]

## 2012-12-29 NOTE — ED Notes (Signed)
Patient transported to X-ray 

## 2013-01-01 ENCOUNTER — Telehealth: Payer: Self-pay | Admitting: Internal Medicine

## 2013-01-01 ENCOUNTER — Ambulatory Visit (INDEPENDENT_AMBULATORY_CARE_PROVIDER_SITE_OTHER): Payer: No Typology Code available for payment source | Admitting: Family Medicine

## 2013-01-01 DIAGNOSIS — Z7901 Long term (current) use of anticoagulants: Secondary | ICD-10-CM

## 2013-01-01 DIAGNOSIS — F528 Other sexual dysfunction not due to a substance or known physiological condition: Secondary | ICD-10-CM

## 2013-01-01 DIAGNOSIS — I82409 Acute embolism and thrombosis of unspecified deep veins of unspecified lower extremity: Secondary | ICD-10-CM

## 2013-01-01 DIAGNOSIS — I743 Embolism and thrombosis of arteries of the lower extremities: Secondary | ICD-10-CM

## 2013-01-01 LAB — POCT INR: INR: 2.6

## 2013-01-01 NOTE — Telephone Encounter (Signed)
Patient would like to be prescribed cialis

## 2013-01-02 MED ORDER — TADALAFIL 5 MG PO TABS: 5.0000 mg | ORAL_TABLET | Freq: Every day | ORAL | Status: DC | PRN

## 2013-01-02 NOTE — Telephone Encounter (Signed)
done

## 2013-01-02 NOTE — Telephone Encounter (Signed)
I do not see where this medication has ever been prescribed in Epic. Pt dropped of a voucher for a free trial valid for  #30 of the 2.5 mg or 5 mg. Please advise if ok to fill to [facility] [address]

## 2013-01-14 ENCOUNTER — Telehealth: Payer: Self-pay | Admitting: *Deleted

## 2013-01-14 NOTE — Telephone Encounter (Signed)
Left msg on triage requesting samples of tribenzor. Called pt back inform him will leave for pick-up...lmb

## 2013-01-15 ENCOUNTER — Other Ambulatory Visit (HOSPITAL_BASED_OUTPATIENT_CLINIC_OR_DEPARTMENT_OTHER): Payer: No Typology Code available for payment source | Admitting: Lab

## 2013-01-15 DIAGNOSIS — D649 Anemia, unspecified: Secondary | ICD-10-CM

## 2013-01-15 LAB — IRON AND TIBC
%SAT: 17 % — ABNORMAL LOW (ref 20–55)
TIBC: 309 ug/dL (ref 215–435)
UIBC: 257 ug/dL (ref 125–400)

## 2013-01-15 LAB — CBC WITH DIFFERENTIAL/PLATELET
BASO%: 1.1 % (ref 0.0–2.0)
Basophils Absolute: 0.1 10*3/uL (ref 0.0–0.1)
EOS%: 3.7 % (ref 0.0–7.0)
HCT: 39.9 % (ref 38.4–49.9)
HGB: 13.7 g/dL (ref 13.0–17.1)
MCH: 30.8 pg (ref 27.2–33.4)
MCHC: 34.3 g/dL (ref 32.0–36.0)
MONO#: 0.7 10*3/uL (ref 0.1–0.9)
NEUT%: 55.2 % (ref 39.0–75.0)
RDW: 15.4 % — ABNORMAL HIGH (ref 11.0–14.6)
WBC: 5.5 10*3/uL (ref 4.0–10.3)
lymph#: 1.5 10*3/uL (ref 0.9–3.3)

## 2013-01-16 ENCOUNTER — Telehealth: Payer: Self-pay | Admitting: Internal Medicine

## 2013-01-16 ENCOUNTER — Encounter: Payer: Self-pay | Admitting: Internal Medicine

## 2013-01-16 ENCOUNTER — Ambulatory Visit (HOSPITAL_BASED_OUTPATIENT_CLINIC_OR_DEPARTMENT_OTHER): Payer: No Typology Code available for payment source | Admitting: Internal Medicine

## 2013-01-16 VITALS — BP 110/75 | HR 88 | Temp 97.1°F | Resp 18 | Ht 69.0 in | Wt 196.7 lb

## 2013-01-16 DIAGNOSIS — D509 Iron deficiency anemia, unspecified: Secondary | ICD-10-CM

## 2013-01-16 DIAGNOSIS — K5010 Crohn's disease of large intestine without complications: Secondary | ICD-10-CM

## 2013-01-16 DIAGNOSIS — D649 Anemia, unspecified: Secondary | ICD-10-CM

## 2013-01-16 NOTE — Telephone Encounter (Signed)
gv and printed appt sched and avs for pt  °

## 2013-01-16 NOTE — Patient Instructions (Signed)
Follow up visit in 6 months. 

## 2013-01-16 NOTE — Progress Notes (Signed)
[facility] Telephone:([phone]   Fax:([phone]  OFFICE PROGRESS NOTE  Sanda Linger, MD [address] [facility] [address] [place], [address] [place] [address]  PRINCIPAL DIAGNOSIS:  1) Iron deficiency anemia with reactive thrombocytosis.  2) Crohn's colitis with obstruction status post colectomy with end ileostomy and repair enterotomy under the care of Dr. 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  5) dry gangrene of the toes of the right foot, status post transmetatarsal amputation on 05/10/2012.   PRIOR THERAPY: The patient is status post intravenous Feraheme infusion. Last dose was given in June 2012.   CURRENT THERAPY: Observation.   INTERVAL HISTORY: Rick Edwards 57 y.o. male returns to the clinic today for routine six-month followup visit. The patient is feeling fine today with no specific complaints. He denied having any significant fatigue or weakness. He denied having any dizzy spells. The patient had repeat CBC and iron study performed recently and he is here for evaluation and discussion of his lab results. He gained almost 40 pounds since his last visit and the patient mentions that he has good appetite and has been eating good recently.  MEDICAL HISTORY: Past Medical History  Diagnosis Date  . Crohn's disease   . Perianal abscess 2001    s/p right hemicolectomy, and drainage of retroperitoneal abcess 2003  . Hypertension   . Hearing loss   . GI bleed 6/11    w/ normal EGD 6/11, 3 PRBCs   . Anemia     anemia thrombocytopenia, saw Hematology 2011, can not r/o myeloproliferative d/c    ALLERGIES:  is allergic to mesalamine.  MEDICATIONS:  Current Outpatient Prescriptions  Medication Sig Dispense Refill  . azaTHIOprine (IMURAN) 50 MG tablet Take 50 mg by  mouth daily.       . Olmesartan-Amlodipine-HCTZ (TRIBENZOR) 20-5-12.5 MG TABS Take 1 tablet by mouth daily after breakfast.      . warfarin (COUMADIN) 5 MG tablet Take 5-10 mg by mouth every evening. Take 1 1/2 (7.5 mg) tablets on Sunday, Monday, Tuesday, Thursday, and Friday. Take 2 tablets (10 mg) on Wednesday and Saturday.      . tadalafil (CIALIS) 5 MG tablet Take 1 tablet (5 mg total) by mouth daily as needed for erectile dysfunction.  30 tablet  5   No current facility-administered medications for this visit.    SURGICAL HISTORY:  Past Surgical History  Procedure Laterality Date  . Hemicolectomy  08/29/2001    perforated abscess  . Anal fistulotomy  2002  . Partial colectomy  03/11/2012    Procedure: PARTIAL COLECTOMY;  Surgeon: Mariella Saa, MD;  Location: [facility];  Service: General;  Laterality: N/A;  subtotal colectomy transverse and left colon   . Embolectomy  03/12/2012    Procedure: EMBOLECTOMY;  Surgeon: Chuck Hint, MD;  Location: [facility] OR;  Service: Vascular;  Laterality: Right;  Right popliteal embolectomy with vein patch angioplasty, right posterior tibial embolectomy with vein patch angioplasty   . Intraoperative arteriogram  03/12/2012    Procedure: INTRA OPERATIVE ARTERIOGRAM;  Surgeon: Chuck Hint, MD;  Location: [facility] OR;  Service: Vascular;  Laterality: Right;  . Transmetatarsal amputation  05/10/2012    right    REVIEW OF SYSTEMS:  A comprehensive review of systems was negative.   PHYSICAL EXAMINATION:  General appearance: alert, cooperative and no distress Head: Normocephalic, without obvious abnormality, atraumatic Neck: no adenopathy Lymph nodes: Cervical, supraclavicular, and axillary nodes normal. Resp: clear to auscultation bilaterally Cardio: regular rate and rhythm, S1, S2 normal, no murmur, click, rub or gallop GI: soft, non-tender; bowel sounds normal; no masses,  no organomegaly Extremities: extremities normal, atraumatic, no cyanosis  or edema  ECOG PERFORMANCE STATUS: 1 - Symptomatic but completely ambulatory  Blood pressure 110/75, pulse 88, temperature 97.1 F (36.2 C), temperature source Oral, resp. rate 18, height 5\' 9"  (1.753 m), weight 196 lb 11.2 oz (89.223 kg).  LABORATORY DATA: Lab Results  Component Value Date   WBC 5.5 01/15/2013   HGB 13.7 01/15/2013   HCT 39.9 01/15/2013   MCV 89.7 01/15/2013   PLT 391 01/15/2013      Chemistry      Component Value Date/Time   NA 138 08/15/2012 1018   K 3.9 08/15/2012 1018   CL 103 08/15/2012 1018   CO2 30 08/15/2012 1018   BUN 11 08/15/2012 1018   CREATININE 0.7 08/15/2012 1018      Component Value Date/Time   CALCIUM 9.2 08/15/2012 1018   ALKPHOS 256* 03/21/2012 0500   AST 23 03/21/2012 0500   ALT 38 03/21/2012 0500   BILITOT 0.4 03/21/2012 0500       RADIOGRAPHIC STUDIES: Dg Chest 2 View  12/29/2012   *RADIOLOGY REPORT*  Clinical Data: Dysphagia  CHEST - 2 VIEW  Comparison: 05/10/2012  Findings: Stable chronic hilar prominence.  Increased subsegmental atelectasis at the right base.  Linear atelectasis at the medial left base improved.  Lungs remain hyperaerated.  No new area of definitive consolidation or mass.  No pneumothorax or pleural effusion.  IMPRESSION: Bibasilar atelectasis as described.   Original Report Authenticated By: Jolaine Click, M.D.    ASSESSMENT AND PLAN:  1) iron deficiency anemia: Significantly improved. The patient has no evidence of iron deficiency on the recent blood work. I recommended for him to continue on observation with repeat CBC and iron study in 6 months. 2) Crohn's colitis: Followup with Dr. Madilyn Fireman as scheduled. He was advised to call immediately if he has any concerning symptoms in the interval.  All questions were answered. The patient knows to call the clinic with any problems, questions or concerns. We can certainly see the patient much sooner if necessary.

## 2013-01-23 ENCOUNTER — Other Ambulatory Visit: Payer: Self-pay | Admitting: Internal Medicine

## 2013-01-29 ENCOUNTER — Ambulatory Visit (INDEPENDENT_AMBULATORY_CARE_PROVIDER_SITE_OTHER): Payer: No Typology Code available for payment source | Admitting: General Practice

## 2013-01-29 ENCOUNTER — Telehealth: Payer: Self-pay

## 2013-01-29 DIAGNOSIS — I743 Embolism and thrombosis of arteries of the lower extremities: Secondary | ICD-10-CM

## 2013-01-29 DIAGNOSIS — Z7901 Long term (current) use of anticoagulants: Secondary | ICD-10-CM

## 2013-01-29 DIAGNOSIS — I82409 Acute embolism and thrombosis of unspecified deep veins of unspecified lower extremity: Secondary | ICD-10-CM

## 2013-01-29 MED ORDER — OLMESARTAN-AMLODIPINE-HCTZ 20-5-12.5 MG PO TABS: 1.0000 | ORAL_TABLET | Freq: Every day | ORAL | Status: DC

## 2013-01-29 NOTE — Telephone Encounter (Signed)
Patient dad called [facility]  Requesting a call back to discuss if coumadin appt is needed. Thanks

## 2013-01-29 NOTE — Telephone Encounter (Signed)
LMOM - Returned call to patient to inform that pt has appointment today 6/25 2 9:45 in coumadin clinic.

## 2013-01-29 NOTE — Telephone Encounter (Signed)
Father aware no sample and rx called in to pharmacy

## 2013-01-30 ENCOUNTER — Telehealth (INDEPENDENT_AMBULATORY_CARE_PROVIDER_SITE_OTHER): Payer: Self-pay | Admitting: General Surgery

## 2013-01-30 ENCOUNTER — Other Ambulatory Visit (INDEPENDENT_AMBULATORY_CARE_PROVIDER_SITE_OTHER): Payer: Self-pay

## 2013-01-30 ENCOUNTER — Encounter (INDEPENDENT_AMBULATORY_CARE_PROVIDER_SITE_OTHER): Payer: Self-pay | Admitting: General Surgery

## 2013-01-30 ENCOUNTER — Telehealth: Payer: Self-pay | Admitting: General Practice

## 2013-01-30 ENCOUNTER — Ambulatory Visit (INDEPENDENT_AMBULATORY_CARE_PROVIDER_SITE_OTHER): Payer: PRIVATE HEALTH INSURANCE | Admitting: General Surgery

## 2013-01-30 VITALS — BP 130/82 | HR 80 | Temp 99.0°F | Resp 16 | Ht 69.0 in | Wt 197.4 lb

## 2013-01-30 DIAGNOSIS — K5010 Crohn's disease of large intestine without complications: Secondary | ICD-10-CM

## 2013-01-30 DIAGNOSIS — K50112 Crohn's disease of large intestine with intestinal obstruction: Secondary | ICD-10-CM

## 2013-01-30 DIAGNOSIS — K50119 Crohn's disease of large intestine with unspecified complications: Secondary | ICD-10-CM

## 2013-01-30 DIAGNOSIS — D6859 Other primary thrombophilia: Secondary | ICD-10-CM | POA: Insufficient documentation

## 2013-01-30 NOTE — Patient Instructions (Signed)
Call us for an appointment to see Dr. Johna Sheriff after your endoscopy and x-rays are completed.

## 2013-01-30 NOTE — Telephone Encounter (Signed)
Message copied by Garrison Columbus on Thu Jan 30, 2013  3:46 PM ------      Message from: Etta Grandchild      Created: Thu Jan 30, 2013  3:32 PM       yes      ----- Message -----         From: Garrison Columbus, RN         Sent: 01/30/2013   3:19 PM           To: Etta Grandchild, MD            Pt to have a sigmoidoscopy on 7/7.  Do you want patient to have Lovenox bridge? (I'm assuming so but need the OK) Thanks,  [place]       ------

## 2013-01-30 NOTE — Progress Notes (Signed)
Chief complaint: Followup Crohn's disease and colectomy  History: Patient returns for more long-term followup with a remote surgical history of right colectomy for Crohn's disease and then in August of 2013 underwent laparotomy with total abdominal colectomy and mid sigmoid Hartmann pouch and end ileostomy for severe Crohn's disease with obstruction of the transverse and proximal left colon. 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 At this time he reports he is doing well. He has gained a lot of weight back to a healthy weight. He feels strong. No trouble with his ileostomy. He denies any abdominal pain or fever. He remains on chronic anticoagulation with warfarin.  Past Medical History  Diagnosis Date  . Crohn's disease   . Perianal abscess 2001    s/p right hemicolectomy, and drainage of retroperitoneal abcess 2003  . Hypertension   . Hearing loss   . GI bleed 6/11    w/ normal EGD 6/11, 3 PRBCs   . Anemia     anemia thrombocytopenia, saw Hematology 2011, can not r/o myeloproliferative d/c   Past Surgical History  Procedure Laterality Date  . Hemicolectomy  08/29/2001    perforated abscess  . Anal fistulotomy  2002  . Partial colectomy  03/11/2012    Procedure: PARTIAL COLECTOMY;  Surgeon: Mariella Saa, MD;  Location: [facility];  Service: General;  Laterality: N/A;  subtotal colectomy transverse and left colon   . Embolectomy  03/12/2012    Procedure: EMBOLECTOMY;  Surgeon: Chuck Hint, MD;  Location: [facility] OR;  Service: Vascular;  Laterality: Right;  Right popliteal embolectomy with vein patch angioplasty, right posterior tibial embolectomy with vein patch angioplasty   . Intraoperative arteriogram  03/12/2012    Procedure: INTRA OPERATIVE ARTERIOGRAM;  Surgeon: Chuck Hint, MD;  Location: [facility] OR;   Service: Vascular;  Laterality: Right;  . Transmetatarsal amputation  05/10/2012    right   Current Outpatient Prescriptions  Medication Sig Dispense Refill  . azaTHIOprine (IMURAN) 50 MG tablet Take 50 mg by mouth daily.       . Olmesartan-Amlodipine-HCTZ (TRIBENZOR) 20-5-12.5 MG TABS Take 1 tablet by mouth daily after breakfast.  30 tablet  5  . warfarin (COUMADIN) 5 MG tablet TAKE 1 TABLET (5 MG TOTAL) BY MOUTH DAILY. TAKE AS DIRECTED BY COUMADIN CLINIC.  50 tablet  2   No current facility-administered medications for this visit.   Allergies  Allergen Reactions  . Mesalamine     REACTION: Rash   Exam: BP 130/82  Pulse 80  Temp(Src) 99 F (37.2 C) (Temporal)  Resp 16  Ht 5\' 9"  (1.753 m)  Wt 197 lb 6.4 oz (89.54 kg)  BMI 29.14 kg/m2 General: Healthy-appearing Afro-American male Skin: No rash or infection HEENT: No palpable masses or thyromegaly. Sclera nonicteric Lungs: Clear breath sounds bilaterally Cardiac: Regular rate and rhythm. No murmurs. Right foot is warm without palpable pulses. Abdomen: Well-healed midline incision without hernias. Soft and nontender. Functioning ileostomy in the right lower quadrant. Extremities: Status post right transmetatarsal amputation well healed.  Chest and plan: Crohn's disease of the large intestine, status post ileostomy and colectomy as above. He strongly desires reversal of his ostomy. We had a long discussion and he understands that there are potential serious complications particularly in light of his antithrombin III deficiency. He understands the strongly desires the ostomy to be taken down. On  going to ask Dr. Madilyn Fireman to do a sigmoidoscopy to examine his residual colon and we will get a contrast study to look at the small bowel. Return following these studies the planned surgery

## 2013-01-30 NOTE — Telephone Encounter (Signed)
UGI set up for Monday 02/03/2013 at 8:45 am at [facility]. Nothing to eat/drink after midnight. Patient aware.

## 2013-01-31 ENCOUNTER — Other Ambulatory Visit: Payer: Self-pay | Admitting: General Practice

## 2013-01-31 ENCOUNTER — Telehealth: Payer: Self-pay | Admitting: General Practice

## 2013-01-31 MED ORDER — ENOXAPARIN SODIUM 150 MG/ML ~~LOC~~ SOLN: 1.5000 mg/kg | Freq: Every day | SUBCUTANEOUS | Status: DC

## 2013-01-31 NOTE — Telephone Encounter (Signed)
Spoke to patient's father, Evaan Tidwell.  Instructed patient's father that Lovenox syringes have been called to [facility] on [address].  Patient's father has agreed to come to coumadin clinic at 8 am on 7/1 to get dosing instructions for  patient.  Patient will be having sigmoidoscopy on 7/7.  Patient's father verbalized understanding.

## 2013-02-02 ENCOUNTER — Other Ambulatory Visit: Payer: Self-pay | Admitting: Internal Medicine

## 2013-02-03 ENCOUNTER — Other Ambulatory Visit (INDEPENDENT_AMBULATORY_CARE_PROVIDER_SITE_OTHER): Payer: Self-pay | Admitting: General Surgery

## 2013-02-03 ENCOUNTER — Ambulatory Visit
Admission: RE | Admit: 2013-02-03 | Discharge: 2013-02-03 | Disposition: A | Discharge status: Home / Self Care | Payer: No Typology Code available for payment source | Source: Ambulatory Visit | Attending: General Surgery | Admitting: General Surgery

## 2013-02-03 DIAGNOSIS — K50119 Crohn's disease of large intestine with unspecified complications: Secondary | ICD-10-CM

## 2013-02-04 ENCOUNTER — Ambulatory Visit: Payer: No Typology Code available for payment source

## 2013-02-04 ENCOUNTER — Telehealth: Payer: Self-pay | Admitting: General Practice

## 2013-02-04 NOTE — Telephone Encounter (Signed)
Gave patient's father instructions for coumadin and Lovenox pre and post surgery. (Push out 15 mls of Lovenox before injecting) 7/2 - Take last dose of coumadin 7/3 - Take nothing 7/4 - Take Lovenox in AM (No coumadin) 7/5 - Take Lovenox in AM (No coumadin) 7/6 - Take Lovenox in AM (No coumadin) 7/7 - Procedure (Take nothing) 7/8 - Take Lovenox in AM and take 2 1/2 tablets of coumadin 7/9 - Take Lovenox in AM and take 2 1/2 tablets of coumadin 7/10 - Take Lovenox in AM and take 2 1/2 tablets of coumadin 7/11 - Re-check in clinic @ 9am (Do not take coumadin or Lovenox until after appointment)

## 2013-02-10 ENCOUNTER — Other Ambulatory Visit: Payer: Self-pay | Admitting: Gastroenterology

## 2013-02-14 ENCOUNTER — Ambulatory Visit (INDEPENDENT_AMBULATORY_CARE_PROVIDER_SITE_OTHER): Payer: No Typology Code available for payment source | Admitting: General Practice

## 2013-02-14 DIAGNOSIS — Z7901 Long term (current) use of anticoagulants: Secondary | ICD-10-CM

## 2013-02-14 DIAGNOSIS — I82409 Acute embolism and thrombosis of unspecified deep veins of unspecified lower extremity: Secondary | ICD-10-CM

## 2013-02-14 DIAGNOSIS — I743 Embolism and thrombosis of arteries of the lower extremities: Secondary | ICD-10-CM

## 2013-02-14 LAB — POCT INR: INR: 1.7

## 2013-02-14 LAB — HM COLONOSCOPY

## 2013-02-25 ENCOUNTER — Encounter (INDEPENDENT_AMBULATORY_CARE_PROVIDER_SITE_OTHER): Payer: Self-pay

## 2013-03-14 ENCOUNTER — Ambulatory Visit (INDEPENDENT_AMBULATORY_CARE_PROVIDER_SITE_OTHER): Payer: No Typology Code available for payment source | Admitting: General Practice

## 2013-03-14 DIAGNOSIS — Z7901 Long term (current) use of anticoagulants: Secondary | ICD-10-CM

## 2013-03-14 DIAGNOSIS — I743 Embolism and thrombosis of arteries of the lower extremities: Secondary | ICD-10-CM

## 2013-03-14 DIAGNOSIS — I82409 Acute embolism and thrombosis of unspecified deep veins of unspecified lower extremity: Secondary | ICD-10-CM

## 2013-04-02 ENCOUNTER — Other Ambulatory Visit (INDEPENDENT_AMBULATORY_CARE_PROVIDER_SITE_OTHER): Payer: Self-pay | Admitting: General Surgery

## 2013-04-02 ENCOUNTER — Encounter (INDEPENDENT_AMBULATORY_CARE_PROVIDER_SITE_OTHER): Payer: Self-pay | Admitting: General Surgery

## 2013-04-02 ENCOUNTER — Telehealth: Payer: Self-pay | Admitting: General Practice

## 2013-04-02 ENCOUNTER — Ambulatory Visit (INDEPENDENT_AMBULATORY_CARE_PROVIDER_SITE_OTHER): Payer: No Typology Code available for payment source | Admitting: General Surgery

## 2013-04-02 VITALS — BP 138/88 | HR 96 | Temp 98.1°F | Resp 16 | Ht 69.0 in | Wt 207.8 lb

## 2013-04-02 DIAGNOSIS — Z932 Ileostomy status: Secondary | ICD-10-CM

## 2013-04-02 NOTE — Telephone Encounter (Signed)
Message copied by Garrison Columbus on Wed Apr 02, 2013  4:56 PM ------      Message from: Wilder Glade      Created: Wed Apr 02, 2013  4:43 PM       Thank You for your Help!      ----- Message -----         From: Garrison Columbus, RN         Sent: 04/02/2013   4:37 PM           To: Wilder Glade, MA            Actually patient can take 135 mg ONCE a day.  So 150 mg syringes would need to be called in.  Patient would need to waste 15 mg.  He has done this before.      ----- Message -----         From: Wilder Glade, MA         Sent: 04/02/2013   4:22 PM           To: Garrison Columbus, RN            Elita Quick. Dr Johna Sheriff wants to know the dosage of Lovenox, he want to know the mg of Lovenox. Thanks for your Help. Pattricia Boss             ------

## 2013-04-02 NOTE — Patient Instructions (Signed)
We will call you within the next week regarding scheduling of your surgery

## 2013-04-02 NOTE — Progress Notes (Addendum)
Chief complaint: Followup Crohn's disease and colectomy  History: Patient returns for more long-term followup with a remote surgical history of right colectomy for Crohn's disease and then in August of 2013 underwent laparotomy with total abdominal colectomy and mid sigmoid Hartmann pouch and end ileostomy for severe Crohn's disease with obstruction of the transverse and proximal left colon. 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 At this time he reports he is doing well. He has gained a lot of weight back to a healthy weight. He feels strong. No trouble with his ileostomy. He denies any abdominal pain or fever. He remains on chronic anticoagulation with warfarin. Since I saw him last has had a small bowel series showing a questionable area of mild narrowing in the terminal ileum but no obstruction or active Crohn's disease seen and he had a colonoscopy by Dr. Hayes which showed no significant colitis in his rectosigmoid pouch.  Past Medical History  Diagnosis Date  . Crohn's disease   . Perianal abscess 2001    s/p right hemicolectomy, and drainage of retroperitoneal abcess 2003  . Hypertension   . Hearing loss   . GI bleed 6/11    w/ normal EGD 6/11, 3 PRBCs   . Anemia     anemia thrombocytopenia, saw Hematology 2011, can not r/o myeloproliferative d/c   Past Surgical History  Procedure Laterality Date  . Hemicolectomy  08/29/2001    perforated abscess  . Anal fistulotomy  2002  . Partial colectomy  03/11/2012    Procedure: PARTIAL COLECTOMY;  Surgeon: Keonda Dow T Ziyon Cedotal, MD;  Location: [facility];  Service: General;  Laterality: N/A;  subtotal colectomy transverse and left colon   . Embolectomy  03/12/2012    Procedure: EMBOLECTOMY;  Surgeon: Christopher S Dickson, MD;  Location: [facility] OR;  Service: Vascular;  Laterality: Right;   Right popliteal embolectomy with vein patch angioplasty, right posterior tibial embolectomy with vein patch angioplasty   . Intraoperative arteriogram  03/12/2012    Procedure: INTRA OPERATIVE ARTERIOGRAM;  Surgeon: Christopher S Dickson, MD;  Location: [facility] OR;  Service: Vascular;  Laterality: Right;  . Transmetatarsal amputation  05/10/2012    right   Current Outpatient Prescriptions  Medication Sig Dispense Refill  . azaTHIOprine (IMURAN) 50 MG tablet Take 50 mg by mouth daily.       . enoxaparin (LOVENOX) 150 MG/ML injection Inject 0.9 mLs (135 mg total) into the skin daily.  10 Syringe  0  . Olmesartan-Amlodipine-HCTZ (TRIBENZOR) 20-5-12.5 MG TABS Take 1 tablet by mouth daily after breakfast.  30 tablet  5  . warfarin (COUMADIN) 5 MG tablet TAKE 1 TABLET (5 MG TOTAL) BY MOUTH DAILY. TAKE AS DIRECTED BY COUMADIN CLINIC.  50 tablet  2  . warfarin (COUMADIN) 5 MG tablet TAKE 1 TABLET (5 MG TOTAL) BY MOUTH DAILY. TAKE AS DIRECTED BY COUMADIN CLINIC.  50 tablet  2   No current facility-administered medications for this visit.   Allergies  Allergen Reactions  . Mesalamine     REACTION: Rash   Exam: BP 138/88  Pulse 96  Temp(Src) 98.1 F (36.7 C) (Temporal)  Resp 16  Ht 5' 9" (1.753 m)  Wt 207 lb 12.8 oz (94.257 kg)  BMI 30.67 kg/m2  SpO2 96% General: Healthy-appearing Afro-American male Skin: No rash or infection HEENT: No palpable masses or thyromegaly. Sclera nonicteric Lungs: Clear   breath sounds bilaterally Cardiac: Regular rate and rhythm. No murmurs. Right foot is warm without palpable pulses. Abdomen: Well-healed midline incision without hernias. Soft and nontender. Functioning ileostomy in the right lower quadrant. Extremities: Status post right transmetatarsal amputation well healed.  Chest and plan: Crohn's disease of the large intestine, status post ileostomy and colectomy as above. He strongly desires reversal of his ostomy. We had a long discussion and he understands  that there are potential serious complications particularly in light of his antithrombin III deficiency. He understands the strongly desires the ostomy to be taken down. I do not see any contraindications. We discussed risks of blood clotting in management of his anticoagulation. Recent surgery including bleeding or infection or leakage of the anastomosis resulting in ostomy or even death were discussed and understood. He understands and wants to proceed. I believe he will be bridging Lovenox and we will discuss this with the anticoagulation clinic. After this I will be back in touch with him with scheduling.  Note: We have discussed management of his anticoagulation with the clinic. We will plan to stop his Coumadin 5 days prior to surgery. 3 days prior to surgery he will begin Lovenox 135 mg daily and will take his last dose the morning before his surgery date. 

## 2013-04-03 ENCOUNTER — Other Ambulatory Visit: Payer: Self-pay | Admitting: Internal Medicine

## 2013-04-04 ENCOUNTER — Other Ambulatory Visit: Payer: Self-pay | Admitting: Internal Medicine

## 2013-04-04 ENCOUNTER — Other Ambulatory Visit: Payer: Self-pay | Admitting: General Practice

## 2013-04-04 MED ORDER — WARFARIN SODIUM 5 MG PO TABS: ORAL_TABLET | ORAL | Status: DC

## 2013-04-09 ENCOUNTER — Other Ambulatory Visit (INDEPENDENT_AMBULATORY_CARE_PROVIDER_SITE_OTHER): Payer: Self-pay | Admitting: General Surgery

## 2013-04-09 NOTE — Progress Notes (Signed)
Dr. Johna Sheriff - Mr. Crean is coming to [facility] Friday 9/5 at 1:30 pm for his preop labs - please enter his preop orders in Epic.   Thanks.

## 2013-04-10 ENCOUNTER — Encounter (HOSPITAL_COMMUNITY): Payer: Self-pay | Admitting: Pharmacy Technician

## 2013-04-11 ENCOUNTER — Encounter (HOSPITAL_COMMUNITY)
Admission: RE | Admit: 2013-04-11 | Discharge: 2013-04-11 | Disposition: A | Discharge status: Home / Self Care | Payer: No Typology Code available for payment source | Source: Ambulatory Visit | Attending: General Surgery | Admitting: General Surgery

## 2013-04-11 ENCOUNTER — Encounter (HOSPITAL_COMMUNITY): Payer: Self-pay

## 2013-04-11 ENCOUNTER — Ambulatory Visit (INDEPENDENT_AMBULATORY_CARE_PROVIDER_SITE_OTHER): Payer: No Typology Code available for payment source | Admitting: General Practice

## 2013-04-11 DIAGNOSIS — R9431 Abnormal electrocardiogram [ECG] [EKG]: Secondary | ICD-10-CM | POA: Insufficient documentation

## 2013-04-11 DIAGNOSIS — I82409 Acute embolism and thrombosis of unspecified deep veins of unspecified lower extremity: Secondary | ICD-10-CM

## 2013-04-11 DIAGNOSIS — Z7901 Long term (current) use of anticoagulants: Secondary | ICD-10-CM

## 2013-04-11 DIAGNOSIS — I743 Embolism and thrombosis of arteries of the lower extremities: Secondary | ICD-10-CM

## 2013-04-11 DIAGNOSIS — Z01810 Encounter for preprocedural cardiovascular examination: Secondary | ICD-10-CM | POA: Insufficient documentation

## 2013-04-11 DIAGNOSIS — Z932 Ileostomy status: Secondary | ICD-10-CM

## 2013-04-11 HISTORY — DX: Personal history of other medical treatment: Z92.89

## 2013-04-11 HISTORY — DX: Ileostomy status: Z93.2

## 2013-04-11 LAB — PROTIME-INR: Prothrombin Time: 22.5 seconds — ABNORMAL HIGH (ref 11.6–15.2)

## 2013-04-11 LAB — CBC
HCT: 42.6 % (ref 39.0–52.0)
MCH: 31.1 pg (ref 26.0–34.0)
MCHC: 34.5 g/dL (ref 30.0–36.0)
MCV: 90.3 fL (ref 78.0–100.0)
Platelets: 392 10*3/uL (ref 150–400)
RDW: 14.5 % (ref 11.5–15.5)

## 2013-04-11 LAB — BASIC METABOLIC PANEL
BUN: 11 mg/dL (ref 6–23)
Calcium: 9.4 mg/dL (ref 8.4–10.5)
Creatinine, Ser: 0.94 mg/dL (ref 0.50–1.35)
GFR calc Af Amer: >90 mL/min (ref >90)

## 2013-04-11 NOTE — Progress Notes (Signed)
04-11-13 labs viewable in epic-please note. Will recheck PT/INR AM of-bridged with Lovenox.

## 2013-04-11 NOTE — Patient Instructions (Signed)
Take last dose of coumadin before surgery on 9/7 9/7 - Take last dose of coumadin 9/8 - No coumadin and No Lovenox 9/9 - Take Lovenox in AM (No coumadin) 9/10 - Take Lovenox in AM (No coumadin) 9/11 - Take Lovenox in AM (No coumadin) 9/12 - No coumadin and No Lovenox  Please call Cindy at coumadin clinic when you get home so we can re-schedule INR check.  [phone]

## 2013-04-11 NOTE — Patient Instructions (Addendum)
22 Rick Edwards  04/11/2013   Your procedure is scheduled on:   04-18-2013  Report to [facility] at        0730 AM  Call this number if you have problems the morning of surgery: [phone]  Or Presurgical Testing [phone](Rick Edwards)   Remember: Follow any bowel prep instructions per MD office. .   Do not eat food:After Midnight.   Take these medicines the morning of surgery with A SIP OF WATER: none. Will give you Amlodipine AM of once arrived to Short Stay..   Do not wear jewelry, make-up or nail polish.  Do not wear lotions, powders, or perfumes. You may wear deodorant.  Do not shave 12 hours prior to first CHG shower(legs and under arms).(face and neck okay.)  Do not bring valuables to the hospital.  Contacts, dentures or bridgework,body piercing,  may not be worn into surgery.  Leave suitcase in the car. After surgery it may be brought to your room.  For patients admitted to the hospital, checkout time is 11:00 AM the day of discharge.   Patients discharged the day of surgery will not be allowed to drive home. Must have responsible person with you x 24 hours once discharged.  Name and phone number of your driver: Rick Edwards -father [phone] cell  Special Instructions: CHG(Chlorhedine 4%-"Hibiclens","Betasept","Aplicare") Shower Use Special Wash: see special instructions.(avoid face and genitals)      Failure to follow these instructions may result in Cancellation of your surgery.   Patient signature_______________________________________________________

## 2013-04-14 NOTE — Pre-Procedure Instructions (Signed)
04-11-13 EKG done today. CXR 5'14 Epic. 04-11-13 labs viewable in Epic note to Dr. Jamse Mead in basket- will repeat Pt/INR AM of.

## 2013-04-15 ENCOUNTER — Telehealth (INDEPENDENT_AMBULATORY_CARE_PROVIDER_SITE_OTHER): Payer: Self-pay

## 2013-04-15 NOTE — Telephone Encounter (Signed)
Pts Dad advised per Dr Jamse Mead order pt does not need colon prep for this surgery. He states he understands and will advise pt.

## 2013-04-15 NOTE — Telephone Encounter (Signed)
Pts Dad [name] called stating pt did not get bowel prep for upcoming ileostomy take down. He wanted to know if pt would need prep. Pt advised we will send msg to Dr Johna Sheriff and his assistant for review. He asks that we call [phone] and if he is not home to please leave a msg. Surgery is 04-18-13.

## 2013-04-15 NOTE — Telephone Encounter (Signed)
Message copied by Joanette Gula on Tue Apr 15, 2013 10:37 AM ------      Message from: Glenna Fellows T      Created: Tue Apr 15, 2013 10:30 AM       Patient does not need a bowel prep for ileostomy takedown ------

## 2013-04-18 ENCOUNTER — Inpatient Hospital Stay (HOSPITAL_COMMUNITY)
Admission: RE | Admit: 2013-04-18 | Discharge: 2013-05-09 | DRG: 335 | Disposition: A | Discharge status: Home Health | Payer: No Typology Code available for payment source | Source: Ambulatory Visit | Attending: General Surgery | Admitting: General Surgery

## 2013-04-18 ENCOUNTER — Encounter (HOSPITAL_COMMUNITY)
Admission: RE | Disposition: A | Discharge status: Home Health | Payer: Self-pay | Source: Ambulatory Visit | Attending: General Surgery

## 2013-04-18 ENCOUNTER — Inpatient Hospital Stay (HOSPITAL_COMMUNITY): Payer: No Typology Code available for payment source | Admitting: Anesthesiology

## 2013-04-18 ENCOUNTER — Encounter (HOSPITAL_COMMUNITY): Payer: Self-pay | Admitting: *Deleted

## 2013-04-18 ENCOUNTER — Encounter (HOSPITAL_COMMUNITY): Payer: Self-pay | Admitting: Anesthesiology

## 2013-04-18 DIAGNOSIS — D473 Essential (hemorrhagic) thrombocythemia: Secondary | ICD-10-CM | POA: Diagnosis present

## 2013-04-18 DIAGNOSIS — T8140XA Infection following a procedure, unspecified, initial encounter: Secondary | ICD-10-CM | POA: Diagnosis not present

## 2013-04-18 DIAGNOSIS — Y838 Other surgical procedures as the cause of abnormal reaction of the patient, or of later complication, without mention of misadventure at the time of the procedure: Secondary | ICD-10-CM | POA: Diagnosis not present

## 2013-04-18 DIAGNOSIS — K567 Ileus, unspecified: Secondary | ICD-10-CM | POA: Diagnosis not present

## 2013-04-18 DIAGNOSIS — K651 Peritoneal abscess: Secondary | ICD-10-CM | POA: Diagnosis not present

## 2013-04-18 DIAGNOSIS — K5010 Crohn's disease of large intestine without complications: Secondary | ICD-10-CM | POA: Diagnosis present

## 2013-04-18 DIAGNOSIS — Z7901 Long term (current) use of anticoagulants: Secondary | ICD-10-CM

## 2013-04-18 DIAGNOSIS — I10 Essential (primary) hypertension: Secondary | ICD-10-CM | POA: Diagnosis present

## 2013-04-18 DIAGNOSIS — Z432 Encounter for attention to ileostomy: Secondary | ICD-10-CM

## 2013-04-18 DIAGNOSIS — D6859 Other primary thrombophilia: Secondary | ICD-10-CM | POA: Diagnosis present

## 2013-04-18 DIAGNOSIS — K560 Paralytic ileus: Secondary | ICD-10-CM | POA: Diagnosis not present

## 2013-04-18 DIAGNOSIS — Z932 Ileostomy status: Secondary | ICD-10-CM

## 2013-04-18 DIAGNOSIS — Z9049 Acquired absence of other specified parts of digestive tract: Secondary | ICD-10-CM

## 2013-04-18 DIAGNOSIS — K660 Peritoneal adhesions (postprocedural) (postinfection): Secondary | ICD-10-CM | POA: Diagnosis present

## 2013-04-18 DIAGNOSIS — Z79899 Other long term (current) drug therapy: Secondary | ICD-10-CM

## 2013-04-18 DIAGNOSIS — D72829 Elevated white blood cell count, unspecified: Secondary | ICD-10-CM | POA: Diagnosis not present

## 2013-04-18 DIAGNOSIS — E876 Hypokalemia: Secondary | ICD-10-CM | POA: Diagnosis not present

## 2013-04-18 HISTORY — PX: ILEOSTOMY CLOSURE: SHX1784

## 2013-04-18 LAB — PROTIME-INR: INR: 1.06 (ref 0.00–1.49)

## 2013-04-18 SURGERY — CLOSURE, ILEOSTOMY
Anesthesia: General | Site: Abdomen | Wound class: Contaminated

## 2013-04-18 MED ORDER — LACTATED RINGERS IV SOLN
INTRAVENOUS | Status: DC
  Administered 2013-04-18: 1000 mL via INTRAVENOUS
  Administered 2013-04-18: 12:00:00 via INTRAVENOUS

## 2013-04-18 MED ORDER — CHLORHEXIDINE GLUCONATE 4 % EX LIQD
1.0000 "application " | Freq: Once | CUTANEOUS | Status: DC
  Filled 2013-04-18: qty 15

## 2013-04-18 MED ORDER — LIDOCAINE HCL (PF) 2 % IJ SOLN
INTRAMUSCULAR | Status: DC | PRN
  Administered 2013-04-18: 100 mg

## 2013-04-18 MED ORDER — LACTATED RINGERS IV SOLN: INTRAVENOUS | Status: DC

## 2013-04-18 MED ORDER — DEXTROSE 5 % IV SOLN
INTRAVENOUS | Status: AC
  Filled 2013-04-18 (×2): qty 1

## 2013-04-18 MED ORDER — WARFARIN - PHARMACIST DOSING INPATIENT: Freq: Every day | Status: DC

## 2013-04-18 MED ORDER — HYDROMORPHONE HCL PF 1 MG/ML IJ SOLN
INTRAMUSCULAR | Status: AC
  Filled 2013-04-18: qty 1

## 2013-04-18 MED ORDER — ONDANSETRON HCL 4 MG/2ML IJ SOLN
INTRAMUSCULAR | Status: DC | PRN
  Administered 2013-04-18: 4 mg via INTRAVENOUS

## 2013-04-18 MED ORDER — DEXTROSE 5 % IV SOLN
2.0000 g | INTRAVENOUS | Status: AC
  Administered 2013-04-18: 2 g via INTRAVENOUS

## 2013-04-18 MED ORDER — MORPHINE SULFATE 2 MG/ML IJ SOLN
2.0000 mg | INTRAMUSCULAR | Status: DC | PRN
  Administered 2013-04-18: 6 mg via INTRAVENOUS
  Administered 2013-04-18 (×4): 4 mg via INTRAVENOUS
  Administered 2013-04-19: 6 mg via INTRAVENOUS
  Filled 2013-04-18: qty 2
  Filled 2013-04-18: qty 3
  Filled 2013-04-18 (×4): qty 2
  Filled 2013-04-18: qty 3

## 2013-04-18 MED ORDER — PHENYLEPHRINE HCL 10 MG/ML IJ SOLN
INTRAMUSCULAR | Status: DC | PRN
  Administered 2013-04-18: 80 ug via INTRAVENOUS

## 2013-04-18 MED ORDER — WARFARIN SODIUM 5 MG PO TABS
5.0000 mg | ORAL_TABLET | Freq: Once | ORAL | Status: AC
  Administered 2013-04-18: 5 mg via ORAL
  Filled 2013-04-18: qty 1

## 2013-04-18 MED ORDER — LACTATED RINGERS IV SOLN
INTRAVENOUS | Status: DC | PRN
  Administered 2013-04-18: 12:00:00 via INTRAVENOUS

## 2013-04-18 MED ORDER — KCL IN DEXTROSE-NACL 20-5-0.9 MEQ/L-%-% IV SOLN
INTRAVENOUS | Status: DC
  Administered 2013-04-18 – 2013-04-20 (×5): via INTRAVENOUS
  Administered 2013-04-20: 125 mL/h via INTRAVENOUS
  Administered 2013-04-21 – 2013-04-22 (×2): via INTRAVENOUS
  Administered 2013-04-22: 50 mL/h via INTRAVENOUS
  Administered 2013-04-23 – 2013-04-24 (×2): via INTRAVENOUS
  Administered 2013-04-24: 75 mL/h via INTRAVENOUS
  Administered 2013-04-26: 18:00:00 via INTRAVENOUS
  Administered 2013-04-27: 75 mL/h via INTRAVENOUS
  Filled 2013-04-18 (×20): qty 1000

## 2013-04-18 MED ORDER — MIDAZOLAM HCL 5 MG/5ML IJ SOLN
INTRAMUSCULAR | Status: DC | PRN
  Administered 2013-04-18: 2 mg via INTRAVENOUS

## 2013-04-18 MED ORDER — GLYCOPYRROLATE 0.2 MG/ML IJ SOLN
INTRAMUSCULAR | Status: DC | PRN
  Administered 2013-04-18: 0.4 mg via INTRAVENOUS

## 2013-04-18 MED ORDER — PROPOFOL 10 MG/ML IV BOLUS
INTRAVENOUS | Status: DC | PRN
  Administered 2013-04-18: 20 mg via INTRAVENOUS
  Administered 2013-04-18: 180 mg via INTRAVENOUS

## 2013-04-18 MED ORDER — BUPIVACAINE-EPINEPHRINE PF 0.25-1:200000 % IJ SOLN
INTRAMUSCULAR | Status: AC
  Filled 2013-04-18: qty 30

## 2013-04-18 MED ORDER — FENTANYL CITRATE 0.05 MG/ML IJ SOLN
INTRAMUSCULAR | Status: DC | PRN
  Administered 2013-04-18: 100 ug via INTRAVENOUS
  Administered 2013-04-18 (×2): 50 ug via INTRAVENOUS
  Administered 2013-04-18: 100 ug via INTRAVENOUS
  Administered 2013-04-18: 50 ug via INTRAVENOUS
  Administered 2013-04-18: 100 ug via INTRAVENOUS
  Administered 2013-04-18: 50 ug via INTRAVENOUS

## 2013-04-18 MED ORDER — ROCURONIUM BROMIDE 100 MG/10ML IV SOLN
INTRAVENOUS | Status: DC | PRN
  Administered 2013-04-18: 50 mg via INTRAVENOUS
  Administered 2013-04-18: 10 mg via INTRAVENOUS
  Administered 2013-04-18: 30 mg via INTRAVENOUS

## 2013-04-18 MED ORDER — LIDOCAINE HCL (PF) 2 % IJ SOLN: INTRAMUSCULAR | Status: DC | PRN

## 2013-04-18 MED ORDER — 0.9 % SODIUM CHLORIDE (POUR BTL) OPTIME
TOPICAL | Status: DC | PRN
  Administered 2013-04-18: 2000 mL

## 2013-04-18 MED ORDER — AMLODIPINE BESYLATE 5 MG PO TABS
5.0000 mg | ORAL_TABLET | Freq: Every day | ORAL | Status: DC
  Filled 2013-04-18: qty 1

## 2013-04-18 MED ORDER — ONDANSETRON HCL 4 MG PO TABS: 4.0000 mg | ORAL_TABLET | Freq: Four times a day (QID) | ORAL | Status: DC | PRN

## 2013-04-18 MED ORDER — HYDROMORPHONE HCL PF 1 MG/ML IJ SOLN
0.2500 mg | INTRAMUSCULAR | Status: DC | PRN
  Administered 2013-04-18 (×4): 0.5 mg via INTRAVENOUS

## 2013-04-18 MED ORDER — ONDANSETRON HCL 4 MG/2ML IJ SOLN
4.0000 mg | Freq: Four times a day (QID) | INTRAMUSCULAR | Status: DC | PRN
  Administered 2013-04-19: 4 mg via INTRAVENOUS
  Filled 2013-04-18 (×2): qty 2

## 2013-04-18 MED ORDER — HEPARIN SODIUM (PORCINE) 5000 UNIT/ML IJ SOLN
5000.0000 [IU] | Freq: Three times a day (TID) | INTRAMUSCULAR | Status: DC
  Administered 2013-04-18 – 2013-04-19 (×2): 5000 [IU] via SUBCUTANEOUS
  Filled 2013-04-18 (×5): qty 1

## 2013-04-18 MED ORDER — NEOSTIGMINE METHYLSULFATE 1 MG/ML IJ SOLN
INTRAMUSCULAR | Status: DC | PRN
  Administered 2013-04-18: 4 mg via INTRAVENOUS

## 2013-04-18 MED ORDER — SODIUM CHLORIDE 0.9 % IJ SOLN
INTRAMUSCULAR | Status: AC
  Filled 2013-04-18: qty 3

## 2013-04-18 SURGICAL SUPPLY — 49 items
APPLICATOR COTTON TIP 6IN STRL (MISCELLANEOUS) IMPLANT
BLADE EXTENDED COATED 6.5IN (ELECTRODE) IMPLANT
BLADE HEX COATED 2.75 (ELECTRODE) ×4 IMPLANT
BLADE SURG SZ10 CARB STEEL (BLADE) ×2 IMPLANT
CANISTER SUCTION 2500CC (MISCELLANEOUS) ×2 IMPLANT
CLIP TI LARGE 6 (CLIP) IMPLANT
CLOTH BEACON ORANGE TIMEOUT ST (SAFETY) ×2 IMPLANT
COVER MAYO STAND STRL (DRAPES) ×2 IMPLANT
DRAPE LAPAROSCOPIC ABDOMINAL (DRAPES) ×2 IMPLANT
DRAPE LG THREE QUARTER DISP (DRAPES) IMPLANT
DRAPE WARM FLUID 44X44 (DRAPE) ×2 IMPLANT
DRSG OPSITE POSTOP 4X10 (GAUZE/BANDAGES/DRESSINGS) ×4 IMPLANT
DRSG PAD ABDOMINAL 8X10 ST (GAUZE/BANDAGES/DRESSINGS) ×2 IMPLANT
ELECT REM PT RETURN 9FT ADLT (ELECTROSURGICAL) ×2
ELECTRODE REM PT RTRN 9FT ADLT (ELECTROSURGICAL) ×1 IMPLANT
GLOVE BIOGEL PI IND STRL 7.0 (GLOVE) ×1 IMPLANT
GLOVE BIOGEL PI INDICATOR 7.0 (GLOVE) ×1
GLOVE ECLIPSE 8.0 STRL XLNG CF (GLOVE) ×2 IMPLANT
GLOVE INDICATOR 8.0 STRL GRN (GLOVE) ×2 IMPLANT
GOWN PREVENTION PLUS LG XLONG (DISPOSABLE) ×2 IMPLANT
GOWN STRL REIN XL XLG (GOWN DISPOSABLE) ×8 IMPLANT
KIT BASIN OR (CUSTOM PROCEDURE TRAY) ×2 IMPLANT
LEGGING LITHOTOMY PAIR STRL (DRAPES) ×2 IMPLANT
NS IRRIG 1000ML POUR BTL (IV SOLUTION) ×4 IMPLANT
PACK GENERAL/GYN (CUSTOM PROCEDURE TRAY) ×2 IMPLANT
SCALPEL HARMONIC ACE (MISCELLANEOUS) IMPLANT
SPONGE GAUZE 4X4 12PLY (GAUZE/BANDAGES/DRESSINGS) ×2 IMPLANT
STAPLER VISISTAT 35W (STAPLE) ×2 IMPLANT
SUCTION POOLE TIP (SUCTIONS) ×2 IMPLANT
SUT CHROMIC 2 0 SH (SUTURE) IMPLANT
SUT NOV 1 T60/GS (SUTURE) ×4 IMPLANT
SUT NOVA 1 T20/GS 25DT (SUTURE) IMPLANT
SUT NOVA NAB DX-16 0-1 5-0 T12 (SUTURE) IMPLANT
SUT NOVA T20/GS 25 (SUTURE) IMPLANT
SUT PDS AB 1 CTX 36 (SUTURE) ×4 IMPLANT
SUT PROLENE 2 0 KS (SUTURE) IMPLANT
SUT SILK 2 0 (SUTURE) ×2
SUT SILK 2 0 SH CR/8 (SUTURE) ×2 IMPLANT
SUT SILK 2 0SH CR/8 30 (SUTURE) IMPLANT
SUT SILK 2-0 18XBRD TIE 12 (SUTURE) ×1 IMPLANT
SUT SILK 2-0 30XBRD TIE 12 (SUTURE) IMPLANT
SUT SILK 3 0 (SUTURE) ×2
SUT SILK 3 0 SH CR/8 (SUTURE) ×2 IMPLANT
SUT SILK 3-0 18XBRD TIE 12 (SUTURE) ×1 IMPLANT
SUT VIC AB 3-0 SH 18 (SUTURE) ×2 IMPLANT
TOWEL OR 17X26 10 PK STRL BLUE (TOWEL DISPOSABLE) ×4 IMPLANT
TRAY FOLEY CATH 14FRSI W/METER (CATHETERS) ×2 IMPLANT
WATER STERILE IRR 1500ML POUR (IV SOLUTION) IMPLANT
YANKAUER SUCT BULB TIP NO VENT (SUCTIONS) ×2 IMPLANT

## 2013-04-18 NOTE — Op Note (Signed)
Preoperative Diagnosis: ileostomy   Postoprative Diagnosis: ileostomy   Procedure: Procedure(s): ILEOSTOMY TAKEDOWN With ileorectal anastomosis   Surgeon: Glenna Fellows T   Assistants: Claud Kelp  Anesthesia:  General endotracheal anesthesia  Indications: Patient is a 57 year old male A. Extensive history of Crohn's colitis, one year status post subtotal colectomy with end ileostomy and Hartmann pouch At his mid 2 distal rectum. He strongly desires takedown of his ileostomy. He has had a preoperative workup including flexible sigmoidoscopy showing no evidence of active colitis and small bowel series showing no abnormality in the small bowel. The patient also has a history of antithrombin III deficiency and is aware of the increased risk of thrombosis and/or bleeding complications. After extensive discussion detailed elsewhere we have elected to proceed with takedown of his ileostomy and ileorectal anastomosis.    Procedure Detail:  Patient was brought to the operating room, placed in the supine position on the operating table, and general endotracheal anesthesia induced. He received preoperative IV antibiotics. Foley catheter was placed. Initially with sterile prep I closed his ileostomy with a pursestring silk suture. He was carefully placed in the lithotomy position. The abdomen and perineum were widely sterilely prepped and draped. Patient timeout was performed and correct procedure verified. The previous wide midline scar was excised and dissection carried down to the subcutaneous tissue the midline fascia. The peritoneal cavity was opened. There were a few small less than 1 cm incisional hernias. There were extensive intra-abdominal adhesions but fortunately these were filmy and not difficult to deal with although somewhat tedious. Extensive adhesio lysis was performed dissecting the omentum off the anterior abdominal wall and up out of the pelvis. Small bowel loops were dissected away  from the abdominal and pelvic sidewalls and interloop adhesions lysed and all the small bowel elevated up out of the pelvis and the small bowel dissected out to the ileostomy which was isolated. Dissection in the pelvis identified the rectosigmoid stump where there were 2 Prolene sutures placed. Using cautery and blunt dissection the rectosigmoid was mobilized up out of the pelvis as much as possible and the staple line was identified and the  End of the rectosigmoid cleared and identified in preparation for anastomosis. It appeared healthy. At this point Dr. Derrell Lolling went below and a rigid sigmoidoscopy was performed up to the rectosigmoid closure at about 20 cm. He was able to carefully pass the 29 mm dilator transanally up to the end of the rectosigmoid stump and we elected to perform a stapled anastomosis. A 29 mm Stealth anvil was introduced into the ileum through an enterotomy very near the ileostomy and was advanced about 5-10 cm proximally. At this point the post of the anvil was brought to the antimesenteric border of the small bowel. The small bowel several centimeters distal to this was then stapled with the blue load linear stapler and the mesentery of the small resected segment was divided between clamps and tied with 2-0 silk ties. Dr. Derrell Lolling then went below and without difficulty passed the 29 mm stapler transanally up to the anterior wall of the rectosigmoid just distal to the staple line and the spike advanced through the wall. The stapler was attached and closed without difficulty excluding all extraneous tissue. The stapler was fired and removed without difficulty. The anastomosis appeared intact and there was certainly no tension. Both doughnuts were intact. Dr. Derrell Lolling then performed rigid sigmoidoscopy, tensely distended and the rectosigmoid with air while I clamped the ileum just above the anastomosis and under  saline irrigation there was no evidence of leak. The bowel was desufflated and the  sigmoidoscope removed. All gloves and instruments and gowns and drapes were changed. The abdomen was thoroughly irrigated and complete hemostasis assured. The fascia and musculature at the ileostomy site were defined and all hernia sac removed and this was closed transversely with interrupted #1 Novafil. I raise short skin and subcutaneous flaps around the middle of the long midline incision due to the diffuse small incisional hernias in the midline fascia was closed with looped running #1 PDS begun at either of the incision and tied centrally as well as several interrupted #1 Novafils. The posterior irrigated and skin closed with staples. Sponge needle and instrument counts were correct.    Estimated Blood Loss:  200 mL         Drains: none  Blood Given: none          Specimens: Portion of ileum and ileostomy        Complications:  * No complications entered in OR log *         Disposition: PACU - hemodynamically stable.         Condition: stable

## 2013-04-18 NOTE — Progress Notes (Signed)
Pt here in Short stay for pre op.Potassium level was 3.1 on 04/11/13. This was reported to Dr Leta Jungling and Potassium level to be repeated today. Lab drawn and tubed to lab

## 2013-04-18 NOTE — H&P (View-Only) (Signed)
Chief complaint: Followup Crohn's disease and colectomy  History: Patient returns for more long-term followup with a remote surgical history of right colectomy for Crohn's disease and then in August of 2013 underwent laparotomy with total abdominal colectomy and mid sigmoid Hartmann pouch and end ileostomy for severe Crohn's disease with obstruction of the transverse and proximal left colon. 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 At this time he reports he is doing well. He has gained a lot of weight back to a healthy weight. He feels strong. No trouble with his ileostomy. He denies any abdominal pain or fever. He remains on chronic anticoagulation with warfarin. Since I saw him last has had a small bowel series showing a questionable area of mild narrowing in the terminal ileum but no obstruction or active Crohn's disease seen and he had a colonoscopy by Dr. Madilyn Fireman which showed no significant colitis in his rectosigmoid pouch.  Past Medical History  Diagnosis Date  . Crohn's disease   . Perianal abscess 2001    s/p right hemicolectomy, and drainage of retroperitoneal abcess 2003  . Hypertension   . Hearing loss   . GI bleed 6/11    w/ normal EGD 6/11, 3 PRBCs   . Anemia     anemia thrombocytopenia, saw Hematology 2011, can not r/o myeloproliferative d/c   Past Surgical History  Procedure Laterality Date  . Hemicolectomy  08/29/2001    perforated abscess  . Anal fistulotomy  2002  . Partial colectomy  03/11/2012    Procedure: PARTIAL COLECTOMY;  Surgeon: Mariella Saa, MD;  Location: [facility] ORS;  Service: General;  Laterality: N/A;  subtotal colectomy transverse and left colon   . Embolectomy  03/12/2012    Procedure: EMBOLECTOMY;  Surgeon: Chuck Hint, MD;  Location: [facility] OR;  Service: Vascular;  Laterality: Right;   Right popliteal embolectomy with vein patch angioplasty, right posterior tibial embolectomy with vein patch angioplasty   . Intraoperative arteriogram  03/12/2012    Procedure: INTRA OPERATIVE ARTERIOGRAM;  Surgeon: Chuck Hint, MD;  Location: [facility] OR;  Service: Vascular;  Laterality: Right;  . Transmetatarsal amputation  05/10/2012    right   Current Outpatient Prescriptions  Medication Sig Dispense Refill  . azaTHIOprine (IMURAN) 50 MG tablet Take 50 mg by mouth daily.       Marland Kitchen enoxaparin (LOVENOX) 150 MG/ML injection Inject 0.9 mLs (135 mg total) into the skin daily.  10 Syringe  0  . Olmesartan-Amlodipine-HCTZ (TRIBENZOR) 20-5-12.5 MG TABS Take 1 tablet by mouth daily after breakfast.  30 tablet  5  . warfarin (COUMADIN) 5 MG tablet TAKE 1 TABLET (5 MG TOTAL) BY MOUTH DAILY. TAKE AS DIRECTED BY COUMADIN CLINIC.  50 tablet  2  . warfarin (COUMADIN) 5 MG tablet TAKE 1 TABLET (5 MG TOTAL) BY MOUTH DAILY. TAKE AS DIRECTED BY COUMADIN CLINIC.  50 tablet  2   No current facility-administered medications for this visit.   Allergies  Allergen Reactions  . Mesalamine     REACTION: Rash   Exam: BP 138/88  Pulse 96  Temp(Src) 98.1 F (36.7 C) (Temporal)  Resp 16  Ht 5\' 9"  (1.753 m)  Wt 207 lb 12.8 oz (94.257 kg)  BMI 30.67 kg/m2  SpO2 96% General: Healthy-appearing Afro-American male Skin: No rash or infection HEENT: No palpable masses or thyromegaly. Sclera nonicteric Lungs: Clear  breath sounds bilaterally Cardiac: Regular rate and rhythm. No murmurs. Right foot is warm without palpable pulses. Abdomen: Well-healed midline incision without hernias. Soft and nontender. Functioning ileostomy in the right lower quadrant. Extremities: Status post right transmetatarsal amputation well healed.  Chest and plan: Crohn's disease of the large intestine, status post ileostomy and colectomy as above. He strongly desires reversal of his ostomy. We had a long discussion and he understands  that there are potential serious complications particularly in light of his antithrombin III deficiency. He understands the strongly desires the ostomy to be taken down. I do not see any contraindications. We discussed risks of blood clotting in management of his anticoagulation. Recent surgery including bleeding or infection or leakage of the anastomosis resulting in ostomy or even death were discussed and understood. He understands and wants to proceed. I believe he will be bridging Lovenox and we will discuss this with the anticoagulation clinic. After this I will be back in touch with him with scheduling.  Note: We have discussed management of his anticoagulation with the clinic. We will plan to stop his Coumadin 5 days prior to surgery. 3 days prior to surgery he will begin Lovenox 135 mg daily and will take his last dose the morning before his surgery date.

## 2013-04-18 NOTE — Anesthesia Preprocedure Evaluation (Addendum)
Anesthesia Evaluation  Patient identified by MRN, date of birth, ID band Patient awake    Reviewed: Allergy & Precautions, H&P , NPO status , Patient's Chart, lab work & pertinent test results  Airway Mallampati: II TM Distance: >3 FB Neck ROM: full    Dental  (+) Poor Dentition, Dental Advisory Given and Chipped Right upper front broken out.  Others look bad:   Pulmonary neg pulmonary ROS,  breath sounds clear to auscultation  Pulmonary exam normal       Cardiovascular Exercise Tolerance: Good hypertension, Pt. on medications Rhythm:regular Rate:Normal     Neuro/Psych negative neurological ROS  negative psych ROS   GI/Hepatic negative GI ROS, Neg liver ROS,   Endo/Other  negative endocrine ROS  Renal/GU negative Renal ROS  negative genitourinary   Musculoskeletal   Abdominal   Peds  Hematology negative hematology ROS (+) Antithrombin 3 deficiency   Anesthesia Other Findings   Reproductive/Obstetrics negative OB ROS                          Anesthesia Physical Anesthesia Plan  ASA: III  Anesthesia Plan: General   Post-op Pain Management:    Induction: Intravenous  Airway Management Planned: Oral ETT  Additional Equipment:   Intra-op Plan:   Post-operative Plan: Extubation in OR  Informed Consent: I have reviewed the patients History and Physical, chart, labs and discussed the procedure including the risks, benefits and alternatives for the proposed anesthesia with the patient or authorized representative who has indicated his/her understanding and acceptance.   Dental Advisory Given  Plan Discussed with: CRNA and Surgeon  Anesthesia Plan Comments:         Anesthesia Quick Evaluation

## 2013-04-18 NOTE — Interval H&P Note (Signed)
History and Physical Interval Note:  04/18/2013 10:38 AM  Rick Edwards  has presented today for surgery, with the diagnosis of ileostomy   The various methods of treatment have been discussed with the patient and family. After consideration of risks, benefits and other options for treatment, the patient has consented to  Procedure(s): ILEOSTOMY TAKEDOWN (N/A) as a surgical intervention .  The patient's history has been reviewed, patient examined, no change in status, stable for surgery.  I have reviewed the patient's chart and labs.  Questions were answered to the patient's satisfaction.     [name] T

## 2013-04-18 NOTE — Progress Notes (Signed)
ANTICOAGULATION CONSULT NOTE - Initial Consult  Pharmacy Consult for warfarin Indication: ATIII Deficiency with arterial thrombi  Allergies  Allergen Reactions  . Mesalamine     REACTION: Rash    Patient Measurements: Height: 5\' 9"  (175.3 cm) Weight: 208 lb 7.5 oz (94.56 kg) IBW/kg (Calculated) : 70.7  Vital Signs: Temp: 99 F (37.2 C) (09/12 1519) Temp src: Oral (09/12 1519) BP: 138/86 mmHg (09/12 1519) Pulse Rate: 90 (09/12 1519)  Labs:  Recent Labs  04/18/13 0805  LABPROT 13.6  INR 1.06    Estimated Creatinine Clearance: 102 ml/min (by C-G formula based on Cr of 0.94).   Medical History: Past Medical History  Diagnosis Date  . Crohn's disease     tx. Imuran  . Perianal abscess 2001    s/p right hemicolectomy, and drainage of retroperitoneal abcess 2003  . Hypertension   . Hearing loss     wears hearing aid left ear; birthed with hearing loss  . GI bleed 6/11    w/ normal EGD 6/11, 3 PRBCs   . Anemia     anemia thrombocytopenia, saw Hematology 2011, can not r/o myeloproliferative d/c  . Transfusion history     last 8'13  . Ileostomy in place 04-11-13    04-18-13 ileostomy to be taken down.    Medications:  Scheduled:  . [START ON 04/19/2013] heparin subcutaneous  5,000 Units Subcutaneous Q8H  . HYDROmorphone      . HYDROmorphone      . sodium chloride       Infusions:  . dextrose 5 % and 0.9 % NaCl with KCl 20 mEq/L      Assessment: 57 yo male s/p ileostomy takedown with ileorectal anastomosis was on warfarin 5mg  daily except 7.5mg  MonSat PTA for ATIII deficiency with arterial thrombi that was stopped 5 days prior to surgery and Lovenox 135mg  daily started 9/9 thru 9/11 AM then surgery on 9/12. To restart warfarin today 9/12 per pharmacy dosing and start SQ heparin 5k q8 tonight with plan to restart full dose Lovenox if no bleeding 24hrs post op  Goal of Therapy:  INR 2-3 Monitor platelets by anticoagulation protocol: Yes   Plan:  1) Warfarin  5mg  tonight 2) Daily INR 3) Noted plan as above to start SQ heparin tonight and restart full dose Lovenox if no bleeding 24hrs post op - would recommend 1mg /kg SQ q12 Lovenox dosing post op rather than 1.5mg /kg q24   Hessie Knows, PharmD, BCPS Pager [phone] 04/18/2013 3:58 PM

## 2013-04-18 NOTE — Transfer of Care (Signed)
Immediate Anesthesia Transfer of Care Note  Patient: Rick Edwards  Procedure(s) Performed: Procedure(s): ILEOSTOMY TAKEDOWN (N/A)  Patient Location: PACU  Anesthesia Type:General  Level of Consciousness: awake, alert  and oriented  Airway & Oxygen Therapy: Patient Spontanous Breathing and Patient connected to face mask oxygen  Post-op Assessment: Report given to PACU RN, Post -op Vital signs reviewed and stable and Patient moving all extremities  Post vital signs: stable  Complications: No apparent anesthesia complications

## 2013-04-18 NOTE — Anesthesia Postprocedure Evaluation (Signed)
  Anesthesia Post-op Note  Patient: Rick Edwards  Procedure(s) Performed: Procedure(s) (LRB): ILEOSTOMY TAKEDOWN (N/A)  Patient Location: PACU  Anesthesia Type: General  Level of Consciousness: awake and alert   Airway and Oxygen Therapy: Patient Spontanous Breathing  Post-op Pain: mild  Post-op Assessment: Post-op Vital signs reviewed, Patient's Cardiovascular Status Stable, Respiratory Function Stable, Patent Airway and No signs of Nausea or vomiting  Last Vitals:  Filed Vitals:   04/18/13 1445  BP: 165/85  Pulse: 88  Temp:   Resp: 16    Post-op Vital Signs: stable   Complications: No apparent anesthesia complications

## 2013-04-19 LAB — CBC
HCT: 42.7 % (ref 39.0–52.0)
Hemoglobin: 14.2 g/dL (ref 13.0–17.0)
MCH: 30.7 pg (ref 26.0–34.0)
MCV: 92.4 fL (ref 78.0–100.0)
RBC: 4.62 MIL/uL (ref 4.22–5.81)

## 2013-04-19 LAB — BASIC METABOLIC PANEL
CO2: 26 mEq/L (ref 19–32)
Calcium: 8.3 mg/dL — ABNORMAL LOW (ref 8.4–10.5)
Creatinine, Ser: 1.03 mg/dL (ref 0.50–1.35)
Glucose, Bld: 141 mg/dL — ABNORMAL HIGH (ref 70–99)

## 2013-04-19 MED ORDER — ENOXAPARIN SODIUM 120 MG/0.8ML ~~LOC~~ SOLN
110.0000 mg | Freq: Once | SUBCUTANEOUS | Status: AC
  Administered 2013-04-19: 110 mg via SUBCUTANEOUS
  Filled 2013-04-19: qty 0.8

## 2013-04-19 MED ORDER — ENOXAPARIN SODIUM 120 MG/0.8ML ~~LOC~~ SOLN
110.0000 mg | Freq: Once | SUBCUTANEOUS | Status: AC
  Administered 2013-04-20: 110 mg via SUBCUTANEOUS
  Filled 2013-04-19: qty 0.8

## 2013-04-19 MED ORDER — ENOXAPARIN SODIUM 100 MG/ML ~~LOC~~ SOLN
95.0000 mg | Freq: Two times a day (BID) | SUBCUTANEOUS | Status: AC
  Administered 2013-04-20 – 2013-04-25 (×11): 95 mg via SUBCUTANEOUS
  Filled 2013-04-19 (×14): qty 1

## 2013-04-19 MED ORDER — WARFARIN SODIUM 5 MG PO TABS
5.0000 mg | ORAL_TABLET | Freq: Once | ORAL | Status: AC
  Administered 2013-04-19: 5 mg via ORAL
  Filled 2013-04-19: qty 1

## 2013-04-19 MED ORDER — HYDROMORPHONE HCL PF 2 MG/ML IJ SOLN
1.0000 mg | INTRAMUSCULAR | Status: DC | PRN
  Administered 2013-04-19 (×6): 2 mg via INTRAVENOUS
  Administered 2013-04-19: 1 mg via INTRAVENOUS
  Administered 2013-04-19 – 2013-04-20 (×6): 2 mg via INTRAVENOUS
  Administered 2013-04-21: 1 mg via INTRAVENOUS
  Administered 2013-04-21 – 2013-04-27 (×38): 2 mg via INTRAVENOUS
  Administered 2013-04-28: 1 mg via INTRAVENOUS
  Administered 2013-04-28 – 2013-05-08 (×34): 2 mg via INTRAVENOUS
  Filled 2013-04-19 (×86): qty 1

## 2013-04-19 NOTE — Progress Notes (Signed)
ANTICOAGULATION CONSULT NOTE - Initial Consult  Pharmacy Consult for warfarin/enoxaparin Indication: ATIII Deficiency with arterial thrombi  Allergies  Allergen Reactions  . Mesalamine     REACTION: Rash    Patient Measurements: Height: 5\' 9"  (175.3 cm) Weight: 208 lb 7.5 oz (94.56 kg) IBW/kg (Calculated) : 70.7  Vital Signs: Temp: 98 F (36.7 C) (09/13 0612) Temp src: Oral (09/13 0612) BP: 119/91 mmHg (09/13 0612) Pulse Rate: 96 (09/13 0612)  Labs:  Recent Labs  04/18/13 0805 04/19/13 0411  HGB  --  14.2  HCT  --  42.7  PLT  --  388  LABPROT 13.6 13.3  INR 1.06 1.03  CREATININE  --  1.03    Estimated Creatinine Clearance: 93.1 ml/min (by C-G formula based on Cr of 1.03).   Assessment: 57 yo male s/p ileostomy takedown with ileorectal anastomosis was on warfarin 5mg  daily except 7.5mg  MonSat PTA for ATIII deficiency with arterial thrombi that was stopped 5 days prior to surgery and Lovenox 135mg  daily started 9/9 thru 9/11 AM then surgery on 9/12. Warfarin was restarted 9/12 per pharmacy dosing and started SQ heparin 5k q8h post-op for VTE prophylaxis with plan to restart full dose Lovenox per pharmacy if no bleeding 24hrs post op.  No bleeding issues per surgery MD note 9/13 therefore ok to start full-dose enoxaparin at 1600 today at 1mg /kg q12h, wt documented as 94.6kg on 9/12  Due to dosing schedule (1600 and 0400), will give slightly larger dose x1 at 1600 today so can extend interval until next dose is due to avoid SQ injection at 0400 on 9/14  INR this AM SUB-therapeutic at 1.03 as expected after restarting at 5mg  x1 last night  SCr and CBC stable at baseline post-op  Goal of Therapy:  INR 2-3 Monitor platelets by anticoagulation protocol: Yes   Plan:  - Warfarin 5mg  tonight - Daily INR - enoxaparin 110mg  SQ x1 at 1600 today - enoxaparin 110mg  SQ x1 at 0600 on 9/14 - enoxaparin 95mg  SQ q12h starting 9/14 at 2000 - discontinue heparin 5000 units SQ  q8h - CBC at least q72 hours - monitor renal function and for signs of bleeding   Thank you for the consult.  Tomi Bamberger, PharmD Clinical Pharmacist Pager: [phone] Pharmacy: [phone] 04/19/2013 9:11 AM

## 2013-04-19 NOTE — Progress Notes (Signed)
Pt with difficulty voiding. Tried for more than an hour. Bladder scan performed. Greater than 360 ml noted on scan. In and out cath performed. obtained. Vwilliams,rn.

## 2013-04-19 NOTE — Progress Notes (Signed)
Patient ID: Rick Edwards, male   DOB: [birth year], 57 y.o.   MRN: [id number] 1 Day Post-Op  Subjective: Had quite a bit of pain early last night but much better after switching to Dilaudid. States he rested well. Denies nausea. His had some belching.  Objective: Vital signs in last 24 hours: Temp:  [97.4 F (36.3 C)-99.3 F (37.4 C)] 98 F (36.7 C) (09/13 0612) Pulse Rate:  [83-101] 96 (09/13 0612) Resp:  [16-20] 18 (09/13 0612) BP: (119-179)/(84-100) 119/91 mmHg (09/13 0612) SpO2:  [94 %-100 %] 97 % (09/13 0612) Weight:  [208 lb 7.5 oz (94.56 kg)] 208 lb 7.5 oz (94.56 kg) (09/12 1519) Last BM Date: 04/18/13  Intake/Output from previous day: 09/12 0701 - 09/13 0700 In: 3914.6 [I.V.:3914.6] Out: 1555 [Urine:1455; Blood:100] Intake/Output this shift:    General appearance: alert, cooperative and no distress Resp: clear to auscultation bilaterally GI: firm with moderate diffuse tenderness.. Incision/Wound: dressings with slight bloody drainage.  Lab Results:   Recent Labs  04/19/13 0411  WBC 12.3*  HGB 14.2  HCT 42.7  PLT 388   BMET  Recent Labs  04/18/13 0805 04/19/13 0411  NA  --  135  K 3.5 4.1  CL  --  101  CO2  --  26  GLUCOSE  --  141*  BUN  --  12  CREATININE  --  1.03  CALCIUM  --  8.3*     Studies/Results: No results found.  Anti-infectives: Anti-infectives   Start     Dose/Rate Route Frequency Ordered Stop   04/18/13 0747  cefOXitin (MEFOXIN) 2 g in dextrose 5 % 50 mL IVPB     2 g 100 mL/hr over 30 Minutes Intravenous On call to O.R. 04/18/13 0747 04/18/13 1144      Assessment/Plan: s/p Procedure(s): ILEOSTOMY TAKEDOWN Stable. No evidence of bleeding. Expected ileus. Plan to restart Lovenox at 1 mg per kilogram twice a day today. Pharmacy consult managing anticoagulation. Continue n.p.o. Out of bed to chair. Labs in a.m.   LOS: 1 day    Yazmine Sorey T 04/19/2013

## 2013-04-20 LAB — BASIC METABOLIC PANEL
BUN: 10 mg/dL (ref 6–23)
CO2: 26 mEq/L (ref 19–32)
Calcium: 8 mg/dL — ABNORMAL LOW (ref 8.4–10.5)
GFR calc non Af Amer: >90 mL/min (ref >90)
Glucose, Bld: 135 mg/dL — ABNORMAL HIGH (ref 70–99)
Potassium: 4 mEq/L (ref 3.5–5.1)

## 2013-04-20 LAB — CBC
HCT: 36 % — ABNORMAL LOW (ref 39.0–52.0)
Hemoglobin: 12 g/dL — ABNORMAL LOW (ref 13.0–17.0)
MCH: 30.9 pg (ref 26.0–34.0)
MCHC: 32.8 g/dL (ref 30.0–36.0)

## 2013-04-20 MED ORDER — WARFARIN SODIUM 5 MG PO TABS
5.0000 mg | ORAL_TABLET | Freq: Once | ORAL | Status: AC
  Administered 2013-04-20: 5 mg via ORAL
  Filled 2013-04-20: qty 1

## 2013-04-20 NOTE — Progress Notes (Signed)
ANTICOAGULATION CONSULT NOTE  Pharmacy Consult for warfarin/enoxaparin Indication: ATIII Deficiency with arterial thrombi  Allergies  Allergen Reactions  . Mesalamine     REACTION: Rash    Patient Measurements: Height: 5\' 9"  (175.3 cm) Weight: 208 lb 7.5 oz (94.56 kg) IBW/kg (Calculated) : 70.7  Vital Signs: Temp: 98.4 F (36.9 C) (09/14 0530) Temp src: Oral (09/14 0530) BP: 138/83 mmHg (09/14 0530) Pulse Rate: 104 (09/14 0530)  Labs:  Recent Labs  04/18/13 0805 04/19/13 0411 04/20/13 0427  HGB  --  14.2 12.0*  HCT  --  42.7 36.0*  PLT  --  388 319  LABPROT 13.6 13.3 14.9  INR 1.06 1.03 1.20  CREATININE  --  1.03 0.85    Estimated Creatinine Clearance: 112.8 ml/min (by C-G formula based on Cr of 0.85).   Assessment: 57 yo male s/p ileostomy takedown with ileorectal anastomosis was on warfarin 5mg  daily except 7.5mg  MonSat PTA for ATIII deficiency with arterial thrombi that was stopped 5 days prior to surgery and Lovenox 135mg  daily started 9/9 thru 9/11 AM then surgery on 9/12. Warfarin was restarted 9/12 per pharmacy dosing and started SQ heparin 5k q8h post-op for VTE prophylaxis that was transitioned to full dose Lovenox per pharmacy dosing on 9/13 (no bleeding 24hrs post op.)  Due to Lovenox dosing schedule (1600 and 0400), will give slightly larger dose x1 at 1600 9/13 so can extend interval until next dose is due to avoid SQ injection at 0400 on 9/14  INR (1.2) this AM SUB-therapeutic, but increasing as expected after only 2 doses.  SCr improved and Hgb decreased to 12.  Md notes slight bloody drainage at incision.  Rn reports no overt bleeding.  Pt remains NPO.  Goal of Therapy:  INR 2-3 Monitor platelets by anticoagulation protocol: Yes   Plan:   Warfarin 5mg  tonight  Enoxaparin 110mg  SQ x1 at 0600 on 9/14, then 95mg  SQ q12h starting 9/14 at 2000.  F/U daily INR and CBC at least q72 hours  Monitor renal function and for signs of  bleeding   Thank you for the consult.  Lynann Beaver PharmD, BCPS Pager [phone] 04/20/2013 8:14 AM

## 2013-04-20 NOTE — Progress Notes (Signed)
Patient ID: TWAIN STENSETH, male   DOB: 10/16/1955, 57 y.o.   MRN: [id number] 2 Days Post-Op  Subjective: Some occasional lower abdominal pain. Not severe. No nausea or vomiting. No flatus yet. States he is voiding okay this morning.  Objective: Vital signs in last 24 hours: Temp:  [98.1 F (36.7 C)-100.5 F (38.1 C)] 98.4 F (36.9 C) (09/14 0530) Pulse Rate:  [99-113] 104 (09/14 0530) Resp:  [18] 18 (09/14 0530) BP: (132-138)/(75-83) 138/83 mmHg (09/14 0530) SpO2:  [91 %-94 %] 92 % (09/14 0530) Last BM Date: 04/18/13  Intake/Output from previous day: 09/13 0701 - 09/14 0700 In: 3062.1 [P.O.:60; I.V.:3002.1] Out: 1650 [Urine:1650] Intake/Output this shift:    General appearance: alert, cooperative and no distress GI: somewhat distended. Moderate lower abdominal tenderness. Now sounds are present but hypoactive. Incision/Wound: slight bloody drainage, no apparent erythema or evidence of infection  Lab Results:   Recent Labs  04/19/13 0411 04/20/13 0427  WBC 12.3* 10.3  HGB 14.2 12.0*  HCT 42.7 36.0*  PLT 388 319   BMET  Recent Labs  04/19/13 0411 04/20/13 0427  NA 135 137  K 4.1 4.0  CL 101 104  CO2 26 26  GLUCOSE 141* 135*  BUN 12 10  CREATININE 1.03 0.85  CALCIUM 8.3* 8.0*   Lab Results  Component Value Date   INR 1.20 04/20/2013   INR 1.03 04/19/2013   INR 1.06 04/18/2013     Studies/Results: No results found.  Anti-infectives: Anti-infectives   Start     Dose/Rate Route Frequency Ordered Stop   04/18/13 0747  cefOXitin (MEFOXIN) 2 g in dextrose 5 % 50 mL IVPB     2 g 100 mL/hr over 30 Minutes Intravenous On call to O.R. 04/18/13 0747 04/18/13 1144      Assessment/Plan: s/p Procedure(s): ILEOSTOMY TAKEDOWN Stable. Expected ileus. Continue n.p.o. For now. Has been walking and continued ambulation encouraged. Antithrombin III deficiency. Starting therapeutic Lovenox and Coumadin, dosing Per pharmacy    LOS: 2 days    Ruchama Kubicek  T 04/20/2013

## 2013-04-21 ENCOUNTER — Encounter (HOSPITAL_COMMUNITY): Payer: Self-pay | Admitting: General Surgery

## 2013-04-21 LAB — BASIC METABOLIC PANEL
BUN: 8 mg/dL (ref 6–23)
Chloride: 107 mEq/L (ref 96–112)
Creatinine, Ser: 0.81 mg/dL (ref 0.50–1.35)
GFR calc Af Amer: >90 mL/min (ref >90)
Glucose, Bld: 132 mg/dL — ABNORMAL HIGH (ref 70–99)

## 2013-04-21 LAB — CBC
HCT: 34.9 % — ABNORMAL LOW (ref 39.0–52.0)
Hemoglobin: 11.2 g/dL — ABNORMAL LOW (ref 13.0–17.0)
MCV: 94.8 fL (ref 78.0–100.0)
RDW: 14.8 % (ref 11.5–15.5)
WBC: 8.8 10*3/uL (ref 4.0–10.5)

## 2013-04-21 MED ORDER — WARFARIN SODIUM 7.5 MG PO TABS
7.5000 mg | ORAL_TABLET | Freq: Once | ORAL | Status: AC
  Administered 2013-04-21: 7.5 mg via ORAL
  Filled 2013-04-21: qty 1

## 2013-04-21 NOTE — Progress Notes (Signed)
ANTICOAGULATION CONSULT NOTE  Pharmacy Consult for warfarin/enoxaparin Indication: ATIII Deficiency with arterial thrombi  Allergies  Allergen Reactions  . Mesalamine     REACTION: Rash    Patient Measurements: Height: 5\' 9"  (175.3 cm) Weight: 208 lb 7.5 oz (94.56 kg) IBW/kg (Calculated) : 70.7  Vital Signs: Temp: 98.1 F (36.7 C) (09/15 0600) Temp src: Oral (09/15 0600) BP: 132/87 mmHg (09/15 0600) Pulse Rate: 93 (09/15 0600)  Labs:  Recent Labs  04/19/13 0411 04/20/13 0427 04/21/13 0403  HGB 14.2 12.0* 11.2*  HCT 42.7 36.0* 34.9*  PLT 388 319 272  LABPROT 13.3 14.9 15.0  INR 1.03 1.20 1.21  CREATININE 1.03 0.85 0.81    Estimated Creatinine Clearance: 118.4 ml/min (by C-G formula based on Cr of 0.81).   Assessment: 57 yo male s/p ileostomy takedown with ileorectal anastomosis was on warfarin 5mg  daily except 7.5mg  MonSat PTA for ATIII deficiency with arterial thrombi that was stopped 5 days prior to surgery and Lovenox 135mg  daily started 9/9 thru 9/11 AM then surgery on 9/12. Warfarin was restarted 9/12 per pharmacy dosing and started SQ heparin 5k q8h post-op for VTE prophylaxis that was transitioned to full dose Lovenox per pharmacy dosing on 9/13 (no bleeding 24hrs post op.)  Lovenox now at 95mg  SQ q12 (8am/8pm schedule) - timing changed yesterday  INR subtherapeutic this AM and essentially unchanged from yesterday after 5mg  x 3 doses warfarin so far.  SCr improved and Hgb decreased a little to 11.2.  No reports of bleeding  Pt remains NPO.  Goal of Therapy:  INR 2-3 Monitor platelets by anticoagulation protocol: Yes   Plan:  1) Increase warfarin dose to 7.5mg  as per home regimen for tonight 2) Continue Lovenox 95mg  SQ q12 until INR >2 3) Monitor for bleeding   Hessie Knows, PharmD, BCPS Pager [phone] 04/21/2013 10:02 AM

## 2013-04-21 NOTE — Progress Notes (Signed)
Patient ID: Rick Edwards, male   DOB: 1956/08/02, 57 y.o.   MRN: [id number] 3 Days Post-Op  Subjective:  Feels pretty good this morning. Just had a loose bowel movement. Denies abdominal pain. He does have some occasional nausea but no vomiting. Has been walking in the halls.  Objective: Vital signs in last 24 hours: Temp:  [98.1 F (36.7 C)-101.2 F (38.4 C)] 98.1 F (36.7 C) (09/15 0600) Pulse Rate:  [93-107] 93 (09/15 0600) Resp:  [18] 18 (09/15 0600) BP: (132-135)/(82-87) 132/87 mmHg (09/15 0600) SpO2:  [92 %-95 %] 92 % (09/15 0600) Last BM Date: 04/21/13  Intake/Output from previous day: 09/14 0701 - 09/15 0700 In: 3010.4 [I.V.:3010.4] Out: 2125 [Urine:2125] Intake/Output this shift: Total I/O In: 487.5 [I.V.:487.5] Out: -   General appearance: alert, cooperative and no distress GI: fairly distended and tympanitic. However nontender. Incision/Wound: dressing dry without apparent erythema or drainage.  Lab Results:   Recent Labs  04/20/13 0427 04/21/13 0403  WBC 10.3 8.8  HGB 12.0* 11.2*  HCT 36.0* 34.9*  PLT 319 272   BMET  Recent Labs  04/20/13 0427 04/21/13 0403  NA 137 141  K 4.0 3.7  CL 104 107  CO2 26 26  GLUCOSE 135* 132*  BUN 10 8  CREATININE 0.85 0.81  CALCIUM 8.0* 8.2*   Lab Results  Component Value Date   INR 1.21 04/21/2013   INR 1.20 04/20/2013   INR 1.03 04/19/2013    Studies/Results: No results found.  Anti-infectives: Anti-infectives   Start     Dose/Rate Route Frequency Ordered Stop   04/18/13 0747  cefOXitin (MEFOXIN) 2 g in dextrose 5 % 50 mL IVPB     2 g 100 mL/hr over 30 Minutes Intravenous On call to O.R. 04/18/13 0747 04/18/13 1144      Assessment/Plan: s/p Procedure(s): ILEOSTOMY TAKEDOWN Overall looks and feels better today. Appears to have some ileus with distention. Hopefully resolving as he does have a bowel movement. Fever last night but afebrile this morning with normal white blood count. Observe for  now. Will hold off on by mouth intake until distention begins to resolve. Antithrombin III deficiency. On Lovenox and warfarin per pharmacy.   LOS: 3 days    Giancarlo Askren T 04/21/2013

## 2013-04-22 LAB — CBC
MCH: 30.9 pg (ref 26.0–34.0)
Platelets: 336 10*3/uL (ref 150–400)
RBC: 3.82 MIL/uL — ABNORMAL LOW (ref 4.22–5.81)
WBC: 5.7 10*3/uL (ref 4.0–10.5)

## 2013-04-22 LAB — PROTIME-INR
INR: 1.19 (ref 0.00–1.49)
Prothrombin Time: 14.8 seconds (ref 11.6–15.2)

## 2013-04-22 LAB — TYPE AND SCREEN
ABO/RH(D): O POS
Unit division: 0

## 2013-04-22 LAB — BASIC METABOLIC PANEL
CO2: 26 mEq/L (ref 19–32)
Calcium: 8.5 mg/dL (ref 8.4–10.5)
Chloride: 109 mEq/L (ref 96–112)
Potassium: 3.4 mEq/L — ABNORMAL LOW (ref 3.5–5.1)
Sodium: 144 mEq/L (ref 135–145)

## 2013-04-22 MED ORDER — BIOTENE DRY MOUTH MT LIQD
15.0000 mL | Freq: Two times a day (BID) | OROMUCOSAL | Status: DC
  Administered 2013-04-23 – 2013-05-07 (×24): 15 mL via OROMUCOSAL

## 2013-04-22 MED ORDER — WARFARIN SODIUM 10 MG PO TABS
10.0000 mg | ORAL_TABLET | Freq: Once | ORAL | Status: AC
  Administered 2013-04-22: 10 mg via ORAL
  Filled 2013-04-22: qty 1

## 2013-04-22 MED ORDER — CHLORHEXIDINE GLUCONATE 0.12 % MT SOLN
15.0000 mL | Freq: Two times a day (BID) | OROMUCOSAL | Status: DC
  Administered 2013-04-23 – 2013-05-08 (×28): 15 mL via OROMUCOSAL
  Filled 2013-04-22 (×36): qty 15

## 2013-04-22 NOTE — Progress Notes (Signed)
ANTICOAGULATION CONSULT NOTE  Pharmacy Consult for warfarin/enoxaparin Indication: ATIII Deficiency with arterial thrombi  Allergies  Allergen Reactions  . Mesalamine     REACTION: Rash    Patient Measurements: Height: 5\' 9"  (175.3 cm) Weight: 208 lb 7.5 oz (94.56 kg) IBW/kg (Calculated) : 70.7  Vital Signs: Temp: 98.4 F (36.9 C) (09/16 0550) Temp src: Oral (09/16 0550) BP: 155/95 mmHg (09/16 0550) Pulse Rate: 90 (09/16 0550)  Labs:  Recent Labs  04/20/13 0427 04/21/13 0403 04/22/13 0410  HGB 12.0* 11.2* 11.8*  HCT 36.0* 34.9* 35.7*  PLT 319 272 336  LABPROT 14.9 15.0 14.8  INR 1.20 1.21 1.19  CREATININE 0.85 0.81 0.76    Estimated Creatinine Clearance: 119.9 ml/min (by C-G formula based on Cr of 0.76).   Assessment: 57 yo male s/p ileostomy takedown with ileorectal anastomosis was on warfarin 5mg  daily except 7.5mg  MonSat PTA for ATIII deficiency with arterial thrombi that was stopped 5 days prior to surgery and Lovenox 135mg  daily started 9/9 thru 9/11 AM then surgery on 9/12. Warfarin was restarted 9/12 per pharmacy dosing and started SQ heparin 5k q8h post-op for VTE prophylaxis that was transitioned to full dose Lovenox per pharmacy dosing on 9/13 (no bleeding 24hrs post op.)  Lovenox now at 95mg  SQ q12 (8am/8pm schedule)  INR subtherapeutic again this AM and not repsonding thus far to warfarin doses -  5mg  x 3 doses and 7.5mg  x 1 dose so far inpatient s/p surgery  SCr and Hgb stable.  No reports of bleeding  Pt remains NPO.  Goal of Therapy:  INR 2-3 Monitor platelets by anticoagulation protocol: Yes   Plan:  1) 10mg  warfarin tonight 2) Continue Lovenox 95mg  SQ q12 until INR >2 3) Monitor for bleeding   Hessie Knows, PharmD, BCPS Pager [phone] 04/22/2013 9:46 AM

## 2013-04-22 NOTE — Progress Notes (Signed)
Patient ID: TORRE SCHAUMBURG, male   DOB: 03/26/1956, 57 y.o.   MRN: [id number] 4 Days Post-Op  Subjective: States he feels better today. Has had at least 6 loose bowel movements. Denies nausea. Some occasional intermittent crampy abdominal pain.  Objective: Vital signs in last 24 hours: Temp:  [98 F (36.7 C)-98.8 F (37.1 C)] 98.4 F (36.9 C) (09/16 0550) Pulse Rate:  [89-96] 90 (09/16 0550) Resp:  [20] 20 (09/16 0550) BP: (130-155)/(80-95) 155/95 mmHg (09/16 0550) SpO2:  [92 %-94 %] 93 % (09/16 0550) Last BM Date: 04/22/13  Intake/Output from previous day: 09/15 0701 - 09/16 0700 In: 2533.3 [I.V.:2533.3] Out: 650 [Urine:650] Intake/Output this shift: Total I/O In: 60 [P.O.:60] Out: -   General appearance: alert, cooperative and no distress GI: still distended but less so and softer than yesterday. Nontender. Incision/Wound: dressing removed. Incisions without evidence of infection, no erythema or drainage  Lab Results:   Recent Labs  04/21/13 0403 04/22/13 0410  WBC 8.8 5.7  HGB 11.2* 11.8*  HCT 34.9* 35.7*  PLT 272 336   BMET  Recent Labs  04/21/13 0403 04/22/13 0410  NA 141 144  K 3.7 3.4*  CL 107 109  CO2 26 26  GLUCOSE 132* 124*  BUN 8 8  CREATININE 0.81 0.76  CALCIUM 8.2* 8.5   Lab Results  Component Value Date   INR 1.19 04/22/2013   INR 1.21 04/21/2013   INR 1.20 04/20/2013     Studies/Results: No results found.  Anti-infectives: Anti-infectives   Start     Dose/Rate Route Frequency Ordered Stop   04/18/13 0747  cefOXitin (MEFOXIN) 2 g in dextrose 5 % 50 mL IVPB     2 g 100 mL/hr over 30 Minutes Intravenous On call to O.R. 04/18/13 0747 04/18/13 1144      Assessment/Plan: s/p Procedure(s): ILEOSTOMY TAKEDOWN Doing well. Ileus appears to be resolving. Start clear liquid diet. Antithrombin III deficiency. On Lovenox and warfarin per pharmacy.    LOS: 4 days    Marlina Cataldi T 04/22/2013

## 2013-04-23 LAB — BASIC METABOLIC PANEL
GFR calc Af Amer: >90 mL/min (ref >90)
GFR calc non Af Amer: >90 mL/min (ref >90)
Glucose, Bld: 126 mg/dL — ABNORMAL HIGH (ref 70–99)
Potassium: 3.4 mEq/L — ABNORMAL LOW (ref 3.5–5.1)
Sodium: 141 mEq/L (ref 135–145)

## 2013-04-23 LAB — CBC
Hemoglobin: 12 g/dL — ABNORMAL LOW (ref 13.0–17.0)
MCHC: 32.5 g/dL (ref 30.0–36.0)
RDW: 14.5 % (ref 11.5–15.5)
WBC: 6.4 10*3/uL (ref 4.0–10.5)

## 2013-04-23 LAB — PROTIME-INR: INR: 1.3 (ref 0.00–1.49)

## 2013-04-23 MED ORDER — OXYCODONE-ACETAMINOPHEN 5-325 MG PO TABS
1.0000 | ORAL_TABLET | ORAL | Status: DC | PRN
  Administered 2013-04-23: 2 via ORAL
  Administered 2013-04-23: 1 via ORAL
  Administered 2013-04-23: 2 via ORAL
  Administered 2013-04-23: 1 via ORAL
  Administered 2013-04-24 (×2): 2 via ORAL
  Administered 2013-04-24: 1 via ORAL
  Administered 2013-04-24 – 2013-05-09 (×42): 2 via ORAL
  Filled 2013-04-23 (×10): qty 2
  Filled 2013-04-23: qty 1
  Filled 2013-04-23 (×20): qty 2
  Filled 2013-04-23: qty 1
  Filled 2013-04-23 (×21): qty 2

## 2013-04-23 MED ORDER — WARFARIN SODIUM 10 MG PO TABS
10.0000 mg | ORAL_TABLET | Freq: Once | ORAL | Status: AC
  Administered 2013-04-23: 10 mg via ORAL
  Filled 2013-04-23: qty 1

## 2013-04-23 NOTE — Progress Notes (Signed)
Patient ID: Rick Edwards, male   DOB: June 28, 1956, 57 y.o.   MRN: [id number] 5 Days Post-Op  Subjective: Feels a little bit better daily. He has noticed some drainage from his wound this morning. Still having several loose bowel movements per day including this morning. Tolerating clear liquids without nausea. Some crampy intermittent abdominal pain. He feels that the pain in his abdominal distention is gradually better.  Objective: Vital signs in last 24 hours: Temp:  [98.5 F (36.9 C)-99.7 F (37.6 C)] 98.5 F (36.9 C) (09/17 0524) Pulse Rate:  [84-95] 84 (09/17 0524) Resp:  [18-20] 20 (09/17 0524) BP: (153-168)/(89-95) 168/95 mmHg (09/17 0524) SpO2:  [94 %-96 %] 94 % (09/17 0524) Last BM Date: 04/22/13  Intake/Output from previous day: 09/16 0701 - 09/17 0700 In: 1582.4 [P.O.:180; I.V.:1402.4] Out: 200 [Urine:200] Intake/Output this shift:    General appearance: alert, cooperative and no distress GI: still moderately distended but less so than yesterday. Nontender. Incision/Wound: some serous drainage from the lower end of his wound.  Mildly tender. No purulence or erythema noted.  Lab Results:   Recent Labs  04/22/13 0410 04/23/13 0408  WBC 5.7 6.4  HGB 11.8* 12.0*  HCT 35.7* 36.9*  PLT 336 339   BMET  Recent Labs  04/22/13 0410 04/23/13 0408  NA 144 141  K 3.4* 3.4*  CL 109 106  CO2 26 25  GLUCOSE 124* 126*  BUN 8 8  CREATININE 0.76 0.75  CALCIUM 8.5 8.5   Lab Results  Component Value Date   INR 1.30 04/23/2013   INR 1.19 04/22/2013   INR 1.21 04/21/2013    Studies/Results: No results found.  Anti-infectives: Anti-infectives   Start     Dose/Rate Route Frequency Ordered Stop   04/18/13 0747  cefOXitin (MEFOXIN) 2 g in dextrose 5 % 50 mL IVPB     2 g 100 mL/hr over 30 Minutes Intravenous On call to O.R. 04/18/13 0747 04/18/13 1144      Assessment/Plan: s/p Procedure(s): ILEOSTOMY TAKEDOWN Seems to be slowly improving. Some expected ileus  but appears better with less distention and tolerating liquids and continued bowel movements Advanced to full liquid diet Wound drainage. This is serous do not see any definite visible wound infection at this time with no erythema or fever or elevated white count. Dressing changes ordered and observe closely Antithrombin III deficiency. On therapeutic Lovenox and Coumadin per pharmacy.   LOS: 5 days    Rick Edwards T 04/23/2013

## 2013-04-23 NOTE — Progress Notes (Addendum)
ANTICOAGULATION CONSULT NOTE  Pharmacy Consult for warfarin/enoxaparin Indication: ATIII Deficiency with arterial thrombi  Allergies  Allergen Reactions  . Mesalamine     REACTION: Rash    Patient Measurements: Height: 5\' 9"  (175.3 cm) Weight: 208 lb 7.5 oz (94.56 kg) IBW/kg (Calculated) : 70.7  Vital Signs: Temp: 98.5 F (36.9 C) (09/17 0524) Temp src: Oral (09/17 0524) BP: 168/95 mmHg (09/17 0524) Pulse Rate: 84 (09/17 0524)  Labs:  Recent Labs  04/21/13 0403 04/22/13 0410 04/23/13 0408  HGB 11.2* 11.8* 12.0*  HCT 34.9* 35.7* 36.9*  PLT 272 336 339  LABPROT 15.0 14.8 15.9*  INR 1.21 1.19 1.30  CREATININE 0.81 0.76 0.75    Estimated Creatinine Clearance: 119.9 ml/min (by C-G formula based on Cr of 0.75).   Assessment: 57 yo male s/p ileostomy takedown with ileorectal anastomosis was on warfarin 5mg  daily except 7.5mg  MonSat PTA for ATIII deficiency with arterial thrombi that was stopped 5 days prior to surgery and Lovenox 135mg  daily started 9/9 thru 9/11 AM then surgery on 9/12. Warfarin was restarted 9/12 per pharmacy dosing and started SQ heparin 5k q8h post-op for VTE prophylaxis that was transitioned to full dose Lovenox per pharmacy dosing on 9/13 (no bleeding 24hrs post op.)  Lovenox now at 95mg  SQ q12 (8am/8pm schedule)  INR subtherapeutic again this AM and not repsonding thus far to warfarin doses -  5mg  x 3 days, 7.5mg , 10 mg (yesterday)  SCr and Hgb stable.  No reports of bleeding  Pt has been NPO, MD ordered FLD to start today.  Goal of Therapy:  INR 2-3 Monitor platelets by anticoagulation protocol: Yes   Plan:  1) Will repeat 10mg  warfarin tonight 2) Continue Lovenox 95mg  SQ q12 until INR >2 3) Monitor for bleeding   Clance Boll, PharmD, BCPS Pager: [phone] 04/23/2013 11:57 AM

## 2013-04-24 ENCOUNTER — Inpatient Hospital Stay (HOSPITAL_COMMUNITY): Payer: No Typology Code available for payment source

## 2013-04-24 LAB — CBC
Platelets: 369 10*3/uL (ref 150–400)
RDW: 14.6 % (ref 11.5–15.5)
WBC: 7.6 10*3/uL (ref 4.0–10.5)

## 2013-04-24 LAB — BASIC METABOLIC PANEL
Chloride: 104 mEq/L (ref 96–112)
GFR calc Af Amer: >90 mL/min (ref >90)
Potassium: 2.9 mEq/L — ABNORMAL LOW (ref 3.5–5.1)

## 2013-04-24 LAB — PROTIME-INR: Prothrombin Time: 19.5 seconds — ABNORMAL HIGH (ref 11.6–15.2)

## 2013-04-24 LAB — MAGNESIUM: Magnesium: 2 mg/dL (ref 1.5–2.5)

## 2013-04-24 MED ORDER — POTASSIUM CHLORIDE 10 MEQ/100ML IV SOLN
10.0000 meq | INTRAVENOUS | Status: AC
  Administered 2013-04-24 (×6): 10 meq via INTRAVENOUS
  Filled 2013-04-24 (×6): qty 100

## 2013-04-24 MED ORDER — POTASSIUM CHLORIDE CRYS ER 20 MEQ PO TBCR
20.0000 meq | EXTENDED_RELEASE_TABLET | Freq: Two times a day (BID) | ORAL | Status: DC
  Administered 2013-04-24 – 2013-04-27 (×7): 20 meq via ORAL
  Filled 2013-04-24 (×10): qty 1

## 2013-04-24 MED ORDER — WARFARIN SODIUM 5 MG PO TABS
5.0000 mg | ORAL_TABLET | Freq: Once | ORAL | Status: AC
  Administered 2013-04-24: 5 mg via ORAL
  Filled 2013-04-24: qty 1

## 2013-04-24 NOTE — Progress Notes (Signed)
6 Days Post-Op  Subjective: Up walking some.  Tolerating full liquids, multiple stools,  Some rectal pain with BM,  Stools still very loose.  Objective: Vital signs in last 24 hours: Temp:  [98.4 F (36.9 C)-99.8 F (37.7 C)] 98.4 F (36.9 C) (09/18 0556) Pulse Rate:  [81-91] 81 (09/18 0556) Resp:  [18] 18 (09/18 0556) BP: (145-160)/(90-92) 147/91 mmHg (09/18 0556) SpO2:  [93 %-96 %] 95 % (09/18 0556) Last BM Date: 04/24/13  7 stools recorded Diet: full liquids Afebrile, BP is up K+ 2.9 INR 1.7  Intake/Output from previous day: 09/17 0701 - 09/18 0700 In: 1560 [P.O.:360; I.V.:1200] Out: 0  Intake/Output this shift:    General appearance: alert, cooperative and no distress Resp: clear to auscultation bilaterally GI: abdomen is distended, +_BS, wounds OK, He has a fair amount of drainage from the lower 1/4th of his wound. midline.    Lab Results:   Recent Labs  04/23/13 0408 04/24/13 0425  WBC 6.4 7.6  HGB 12.0* 11.9*  HCT 36.9* 35.5*  PLT 339 369    BMET  Recent Labs  04/23/13 0408 04/24/13 0425  NA 141 140  K 3.4* 2.9*  CL 106 104  CO2 25 24  GLUCOSE 126* 116*  BUN 8 8  CREATININE 0.75 0.74  CALCIUM 8.5 8.5   PT/INR  Recent Labs  04/23/13 0408 04/24/13 0425  LABPROT 15.9* 19.5*  INR 1.30 1.70*    No results found for this basename: AST, ALT, ALKPHOS, BILITOT, PROT, ALBUMIN,  in the last 168 hours   Lipase     Component Value Date/Time   LIPASE 16 05/09/2010 1903     Studies/Results: No results found.  Medications: . antiseptic oral rinse  15 mL Mouth Rinse q12n4p  . chlorhexidine  15 mL Mouth Rinse BID  . enoxaparin (LOVENOX) injection  95 mg Subcutaneous Q12H  . potassium chloride  10 mEq Intravenous Q1 Hr x 6  . warfarin  5 mg Oral ONCE-1800  . Warfarin - Pharmacist Dosing Inpatient   Does not apply q1800   Prior to Admission medications   Medication Sig Start Date End Date Taking? Authorizing Provider  azaTHIOprine (IMURAN)  50 MG tablet Take 50 mg by mouth daily.    Yes Historical Provider, MD  enoxaparin (LOVENOX) 150 MG/ML injection Inject 135 mg into the skin daily. First injection 04-15-13 AM, last dose to be 04-17-13 AM. No additional doses prior to surgery. No Coumadin while taking Lovenox or after stopped for surgery.   Yes Historical Provider, MD  Olmesartan-Amlodipine-HCTZ Marya Landry) 20-5-12.5 MG TABS Take 1 tablet by mouth daily after breakfast. 01/29/13  Yes Etta Grandchild, MD  warfarin (COUMADIN) 5 MG tablet Take 5 mg by mouth daily. Takes 1 tablet on Wednesday and Saturday and 1.5 tablets the rest of the week    Historical Provider, MD     Assessment/Plan ILEOSTOMY TAKEDOWN With ileorectal anastomosis, 04/18/2013, Mariella Saa, MD Crohn's colitis, one year status post subtotal colectomy with end ileostomy and Hartmann pouch  Perianal abscess, 2001, s/p right hemicolectomy. Hypertension GI bleed  Antithrombin III deficiency with chronic coumadin use.  He has had 6 runs   Plan:  His K+, is being replace, I will give him some PO also.  I am going to increase his diet as tolerated to low fiber.  MG is pending, I am concerned about the drainage from his lower abdomen incision, but it is all serous, i don't know how long that dressing had  been in place.     LOS: 6 days    Soren Pigman 04/24/2013

## 2013-04-24 NOTE — Progress Notes (Signed)
ANTICOAGULATION CONSULT NOTE  Pharmacy Consult for warfarin/enoxaparin Indication: ATIII Deficiency with arterial thrombi  Allergies  Allergen Reactions  . Mesalamine     REACTION: Rash    Patient Measurements: Height: 5\' 9"  (175.3 cm) Weight: 208 lb 7.5 oz (94.56 kg) IBW/kg (Calculated) : 70.7  Vital Signs: Temp: 98.4 F (36.9 C) (09/18 0556) Temp src: Oral (09/18 0556) BP: 147/91 mmHg (09/18 0556) Pulse Rate: 81 (09/18 0556)  Labs:  Recent Labs  04/22/13 0410 04/23/13 0408 04/24/13 0425  HGB 11.8* 12.0* 11.9*  HCT 35.7* 36.9* 35.5*  PLT 336 339 369  LABPROT 14.8 15.9* 19.5*  INR 1.19 1.30 1.70*  CREATININE 0.76 0.75 0.74    Estimated Creatinine Clearance: 119.9 ml/min (by C-G formula based on Cr of 0.74).   Assessment: 57 yo male s/p ileostomy takedown with ileorectal anastomosis was on warfarin 5mg  daily except 7.5mg  MonSat PTA for ATIII deficiency with arterial thrombi that was stopped 5 days prior to surgery and Lovenox 135mg  daily started 9/9 thru 9/11 AM then surgery on 9/12. Warfarin was restarted 9/12 per pharmacy dosing and started SQ heparin 5k q8h post-op for VTE prophylaxis that was transitioned to full dose Lovenox per pharmacy dosing on 9/13 (no bleeding 24hrs post op.)  Lovenox now at 95mg  SQ q12 (8am/8pm schedule)  INR subtherapeutic this AM at 1.71 but finally responding and on the rise after providing 10 mg doses x 2 days.  Inpatient warfarin doses so far-  5mg  x 3 days, 7.5mg , 10mg , 10mg   SCr and Hgb stable.  No reports of bleeding.  Pt started FLD yesterday.  Goal of Therapy:  INR 2-3 Monitor platelets by anticoagulation protocol: Yes   Plan:  1) Warfarin 5 mg once tonight.  Providing lower dose tonight since anticipating INR continuing to rise significantly following 10 mg dose given yesterday. 2) Continue Lovenox 95mg  SQ q12 until INR >2 3) Monitor for bleeding   Clance Boll, PharmD, BCPS Pager: [phone] 04/24/2013 8:56  AM

## 2013-04-24 NOTE — Consult Note (Signed)
WOC consult Note Reason for Consult:Placement of NPWT device into midline incisional wound Wound type:surgical Pressure Ulcer POA: No Measurement:23cm x 4cm x 2.5cm Wound bed:red, moist, free of necrotic tissue Drainage (amount, consistency, odor) small amount of serosanguinous drainage on old dressing. RLQ ostomy site is closely approximated with sutures. Periwound:intact, clear Dressing procedure/placement/frequency:NPWT initiated using 1 piece of black foam. negative pressure achieved without difficulty or discomfort to patient and tolerated well.  Changes are Tuesday, Thursday and Saturday.  Bedside RN to change V.A.C. From this point forward. WOC nursing team will not follow, but will remain available to this patient, the nursing and medical team.  Please re-consult if needed. Thanks, Ladona Mow, MSN, RN, GNP, [place], CWON-AP [phone])

## 2013-04-24 NOTE — Progress Notes (Signed)
Gen. Surgery:  I have interviewed and examined this patient this afternoon. I agree with the assessment and treatment plan outlined by Mr. Marlyne Beards, [place].  The patient is ambulatory, tolerating full liquid diet, and having loose stools. He is in no distress.He is still significantly distended, however. He does have some bowel sounds. The wound is draining serous fluid. I began to remove staples and encountered purulent fluid this I removed all the staples and opened up the wound its entirety. He has a significant superficial wound infection. The fascia is intact. I packed the wound with Kerlix.  Assessment/Plan: POD #6. Lysis of adhesions and takedown of ileostomy with ileorectal anastomosis. Clinically, ileus seems to be resolving, but his degree of distention is concerning. Portable KUB ordered.  Superficial wound infection, wound open today. Fascia intact. Begin twice a day dressing changes. Consult the wound and ostomy nursing group.  Crohn's colitis, one year status post subtotal cleft through the end ileostomy and Hartmann pouch  Antithrombin III deficiency. Pharmacy managing Lovenox and Coumadin.  Hypokalemia. Were 6 runs of KCl. Check labs tomorrow    Angelia Mould. Derrell Lolling, M.D., [facility] General and Minimally invasive Surgery Breast and Colorectal Surgery Office:   [phone]

## 2013-04-25 DIAGNOSIS — K567 Ileus, unspecified: Secondary | ICD-10-CM | POA: Diagnosis not present

## 2013-04-25 DIAGNOSIS — Z932 Ileostomy status: Secondary | ICD-10-CM

## 2013-04-25 LAB — PROTIME-INR: Prothrombin Time: 22 seconds — ABNORMAL HIGH (ref 11.6–15.2)

## 2013-04-25 LAB — BASIC METABOLIC PANEL
Chloride: 107 mEq/L (ref 96–112)
Creatinine, Ser: 0.81 mg/dL (ref 0.50–1.35)
GFR calc Af Amer: >90 mL/min (ref >90)
Potassium: 3.7 mEq/L (ref 3.5–5.1)

## 2013-04-25 LAB — CBC
Platelets: 424 10*3/uL — ABNORMAL HIGH (ref 150–400)
RDW: 14.6 % (ref 11.5–15.5)
WBC: 9.5 10*3/uL (ref 4.0–10.5)

## 2013-04-25 MED ORDER — WARFARIN SODIUM 5 MG PO TABS
5.0000 mg | ORAL_TABLET | Freq: Once | ORAL | Status: AC
  Administered 2013-04-25: 5 mg via ORAL
  Filled 2013-04-25 (×2): qty 1

## 2013-04-25 MED ORDER — BOOST / RESOURCE BREEZE PO LIQD
1.0000 | Freq: Three times a day (TID) | ORAL | Status: DC
  Administered 2013-04-25 – 2013-05-09 (×35): 1 via ORAL

## 2013-04-25 NOTE — Progress Notes (Signed)
ANTICOAGULATION CONSULT NOTE  Pharmacy Consult for warfarin/enoxaparin Indication: ATIII Deficiency with arterial thrombi  Allergies  Allergen Reactions  . Mesalamine     REACTION: Rash    Patient Measurements: Height: 5\' 9"  (175.3 cm) Weight: 208 lb 7.5 oz (94.56 kg) IBW/kg (Calculated) : 70.7  Vital Signs: Temp: 98.8 F (37.1 C) (09/19 0545) Temp src: Oral (09/19 0545) BP: 152/95 mmHg (09/19 0545) Pulse Rate: 85 (09/19 0545)  Labs:  Recent Labs  04/23/13 0408 04/24/13 0425 04/25/13 0420  HGB 12.0* 11.9* 11.7*  HCT 36.9* 35.5* 34.7*  PLT 339 369 424*  LABPROT 15.9* 19.5* 22.0*  INR 1.30 1.70* 1.99*  CREATININE 0.75 0.74 0.81    Estimated Creatinine Clearance: 118.4 ml/min (by C-G formula based on Cr of 0.81).   Assessment: 57 yo male s/p ileostomy takedown with ileorectal anastomosis was on warfarin 5mg  daily except 7.5mg  MonSat PTA for ATIII deficiency with arterial thrombi that was stopped 5 days prior to surgery and Lovenox 135mg  daily started 9/9 thru 9/11 AM then surgery on 9/12. Warfarin was restarted 9/12 per pharmacy dosing and started SQ heparin 5k q8h post-op for VTE prophylaxis that was transitioned to full dose Lovenox per pharmacy dosing on 9/13 (no bleeding 24hrs post op.)  Lovenox now at 95mg  SQ q12 (8am/8pm schedule)  INR is nearly at goal at 1.99.   Inpatient warfarin doses so far-  5mg  x 3 days, 7.5mg , 10mg , 10mg , 5 mg  SCr and Hgb stable.  No reports of bleeding.  Pt continueing FLD today, started 9/18    Goal of Therapy:  INR 2-3 Monitor platelets by anticoagulation protocol: Yes   Plan:  1) Warfarin 5 mg po x 1 tonight.  Expect INR to continue to trend up.   2) Continue Lovenox 95mg  SQ q12 until INR >2 - Will discontinue after this evenings dose as INR is nearly 2.  Will f/u in AM to ensure INR remains therapeutic 3) Monitor for bleeding   Kristol Almanzar, Loma Messing PharmD Pager #: [phone] 1:15 PM 04/25/2013

## 2013-04-25 NOTE — Care Management Note (Signed)
    Page 1 of 2   04/25/2013     10:44:47 AM   CARE MANAGEMENT NOTE 04/25/2013  Patient:  Rick Edwards, Rick Edwards   Account Number:  [id number]  Date Initiated:  04/19/2013  Documentation initiated by:  [facility]  Subjective/Objective Assessment:   57 year old male admitted s/p lieostomy takedown.     Action/Plan:   From home.   Anticipated DC Date:  04/28/2013   Anticipated DC Plan:  HOME W HOME HEALTH SERVICES      DC Planning Services  CM consult      PAC Choice  DURABLE MEDICAL EQUIPMENT  HOME HEALTH   Choice offered to / List presented to:  C-1 Patient   DME arranged  [facility]      DME agency  [facility]     HH arranged  HH-1 RN      [facility] agency  [facility]   Status of service:  Completed, signed off Medicare Important Message given?  NA - LOS <3 / Initial given by admissions (If response is "NO", the following Medicare IM given date fields will be blank) Date Medicare IM given:   Date Additional Medicare IM given:    Discharge Disposition:  HOME W HOME HEALTH SERVICES  Per UR Regulation:  Reviewed for med. necessity/level of care/duration of stay  If discussed at Long Length of Stay Meetings, dates discussed:    Comments:  32 Spoke with patient at bedside. Now with wound vac, will likely need HH RN at d/c for continuation of this. Has used [facility] in the past and would like to use them again. Has also requested a cane, contacted [facility] to assist with this. Awaiting final orders.

## 2013-04-25 NOTE — Progress Notes (Signed)
INITIAL NUTRITION ASSESSMENT  DOCUMENTATION CODES Per approved criteria  -Obesity Unspecified   INTERVENTION: - Resource Breeze TID - Diet advancement per MD - Will continue to monitor   NUTRITION DIAGNOSIS: Inadequate oral intake related to diet order as evidenced by clear liquid diet.    Goal: Advance diet as tolerated to low fiber diet  Monitor:  Weights, labs, diet advancement  Reason for Assessment: Poor intake  57 y.o. male  Admitting Dx: Ileostomy takedown  ASSESSMENT: Pt admitted for ileostomy takedown which was completed 04/18/13. Met with pt who reports eating well PTA, 3 meals/day with good appetite and stable weight, also drinking 2-3 Resource Breeze/day. Pt with loose stool yesterday.   Height: Ht Readings from Last 1 Encounters:  04/18/13 5\' 9"  (1.753 m)    Weight: Wt Readings from Last 1 Encounters:  04/18/13 208 lb 7.5 oz (94.56 kg)    Ideal Body Weight: 160 lb   % Ideal Body Weight: 130%  Wt Readings from Last 10 Encounters:  04/18/13 208 lb 7.5 oz (94.56 kg)  04/18/13 208 lb 7.5 oz (94.56 kg)  04/11/13 207 lb (93.895 kg)  04/02/13 207 lb 12.8 oz (94.257 kg)  01/30/13 197 lb 6.4 oz (89.54 kg)  01/16/13 196 lb 11.2 oz (89.223 kg)  09/25/12 174 lb (78.926 kg)  08/15/12 166 lb 8 oz (75.524 kg)  08/02/12 154 lb 12.8 oz (70.217 kg)  07/18/12 150 lb 12.8 oz (68.402 kg)    Usual Body Weight: 207 lb   % Usual Body Weight: 100%  BMI:  Body mass index is 30.77 kg/(m^2). Class I obesity  Estimated Nutritional Needs: Kcal: 1800-2000 Protein: 110-120g Fluid: 1.8-2L/day  Skin: Wound VAC on abdomen  Diet Order: Clear Liquid  EDUCATION NEEDS: -No education needs identified at this time   Intake/Output Summary (Last 24 hours) at 04/25/13 1319 Last data filed at 04/25/13 1000  Gross per 24 hour  Intake 2812.92 ml  Output    800 ml  Net 2012.92 ml    Last BM: 9/18  Labs:   Recent Labs Lab 04/23/13 0408 04/24/13 0425  04/25/13 0420  NA 141 140 141  K 3.4* 2.9* 3.7  CL 106 104 107  CO2 25 24 26   BUN 8 8 6   CREATININE 0.75 0.74 0.81  CALCIUM 8.5 8.5 8.5  MG  --  2.0  --   GLUCOSE 126* 116* 120*    CBG (last 3)  No results found for this basename: GLUCAP,  in the last 72 hours  Scheduled Meds: . antiseptic oral rinse  15 mL Mouth Rinse q12n4p  . chlorhexidine  15 mL Mouth Rinse BID  . enoxaparin (LOVENOX) injection  95 mg Subcutaneous Q12H  . potassium chloride  20 mEq Oral BID WC  . Warfarin - Pharmacist Dosing Inpatient   Does not apply q1800    Continuous Infusions: . dextrose 5 % and 0.9 % NaCl with KCl 20 mEq/L 75 mL/hr (04/24/13 2037)    Past Medical History  Diagnosis Date  . Crohn's disease     tx. Imuran  . Perianal abscess 2001    s/p right hemicolectomy, and drainage of retroperitoneal abcess 2003  . Hypertension   . Hearing loss     wears hearing aid left ear; birthed with hearing loss  . GI bleed 6/11    w/ normal EGD 6/11, 3 PRBCs   . Anemia     anemia thrombocytopenia, saw Hematology 2011, can not r/o myeloproliferative d/c  . Transfusion history  last 8'13  . Ileostomy in place 04-11-13    04-18-13 ileostomy to be taken down.    Past Surgical History  Procedure Laterality Date  . Hemicolectomy  08/29/2001    perforated abscess  . Anal fistulotomy  2002  . Partial colectomy  03/11/2012    Procedure: PARTIAL COLECTOMY;  Surgeon: Mariella Saa, MD;  Location: [facility];  Service: General;  Laterality: N/A;  subtotal colectomy transverse and left colon   . Embolectomy  03/12/2012    Procedure: EMBOLECTOMY;  Surgeon: Chuck Hint, MD;  Location: [facility] OR;  Service: Vascular;  Laterality: Right;  Right popliteal embolectomy with vein patch angioplasty, right posterior tibial embolectomy with vein patch angioplasty   . Intraoperative arteriogram  03/12/2012    Procedure: INTRA OPERATIVE ARTERIOGRAM;  Surgeon: Chuck Hint, MD;  Location: [facility] OR;  Service:  Vascular;  Laterality: Right;  . Transmetatarsal amputation  05/10/2012    right  . Ileostomy closure N/A 04/18/2013    Procedure: ILEOSTOMY TAKEDOWN;  Surgeon: Mariella Saa, MD;  Location: [facility];  Service: General;  Laterality: N/A;    Levon Hedger MS, RD, LDN [phone] Pager [phone] After Hours Pager

## 2013-04-25 NOTE — Progress Notes (Signed)
7 Days Post-Op  Subjective: Pt doing well.  States he passing some gas.  Wound Vac in place.    Objective: Vital signs in last 24 hours: Temp:  [98.1 F (36.7 C)-100.1 F (37.8 C)] 98.8 F (37.1 C) (09/19 0545) Pulse Rate:  [81-90] 85 (09/19 0545) Resp:  [18-20] 20 (09/19 0545) BP: (135-156)/(87-95) 152/95 mmHg (09/19 0545) SpO2:  [96 %-99 %] 98 % (09/19 0545) Last BM Date: 04/24/13  Intake/Output from previous day: 09/18 0701 - 09/19 0700 In: 3127.9 [P.O.:1080; I.V.:1547.9; IV Piggyback:500] Out: 800 [Urine:800] Intake/Output this shift:    General appearance: alert and cooperative Cardio: regular rate and rhythm, S1, S2 normal, no murmur, click, rub or gallop GI: wound vac in place, hypoactive BS, distended abd,   Lab Results:   Recent Labs  04/24/13 0425 04/25/13 0420  WBC 7.6 9.5  HGB 11.9* 11.7*  HCT 35.5* 34.7*  PLT 369 424*   BMET  Recent Labs  04/24/13 0425 04/25/13 0420  NA 140 141  K 2.9* 3.7  CL 104 107  CO2 24 26  GLUCOSE 116* 120*  BUN 8 6  CREATININE 0.74 0.81  CALCIUM 8.5 8.5   PT/INR  Recent Labs  04/24/13 0425 04/25/13 0420  LABPROT 19.5* 22.0*  INR 1.70* 1.99*   ABG No results found for this basename: PHART, PCO2, PO2, HCO3,  in the last 72 hours  Studies/Results: Dg Abd 1 View  04/24/2013   CLINICAL DATA:  Reversed colostomy. Abdominal pain. Postop abdominal distention.  EXAM: ABDOMEN - 1 VIEW  COMPARISON:  Abdomen radiographs, 03/07/2012 and CT, 11/17/2011.  FINDINGS: There are dilated loops of small bowel within the central abdomen and right mid abdomen. The stomach is mildly distended with air.  There are surgical vascular clips in the right mid abdomen, stable from the prior study. There are new overlying skin staples.  Two skin staples are also noted superimposed over the right pelvis.  IMPRESSION: Dilated loops of small bowel. This may reflect a diffuse postoperative adynamic ileus, favored, versus a partial obstruction.    Electronically Signed   By: Amie Portland   On: 04/24/2013 15:42    Anti-infectives: Anti-infectives   Start     Dose/Rate Route Frequency Ordered Stop   04/18/13 0747  cefOXitin (MEFOXIN) 2 g in dextrose 5 % 50 mL IVPB     2 g 100 mL/hr over 30 Minutes Intravenous On call to O.R. 04/18/13 0747 04/18/13 1144      Assessment/Plan: s/p Procedure(s): ILEOSTOMY TAKEDOWN (N/A)  POD #7. Lysis of adhesions and takedown of ileostomy with ileorectal anastomosis.  Ileus: con't with Clears at this time.  Encourage ambulation, await good bowel function.  Superficial wound infection:Wound vac placed Thurs per wound RN, WBC normal  Crohn's colitis, one year status post subtotal cleft through the end ileostomy and Hartmann pouch  Antithrombin III deficiency. Pharmacy managing Lovenox and Coumadin.  Hypokalemia: correcting : LOS: 7 days    Marigene Ehlers., [facility] 04/25/2013

## 2013-04-26 LAB — PROTIME-INR
INR: 2.05 — ABNORMAL HIGH (ref 0.00–1.49)
Prothrombin Time: 22.5 seconds — ABNORMAL HIGH (ref 11.6–15.2)

## 2013-04-26 LAB — BASIC METABOLIC PANEL
CO2: 23 mEq/L (ref 19–32)
Calcium: 8.3 mg/dL — ABNORMAL LOW (ref 8.4–10.5)
GFR calc Af Amer: >90 mL/min (ref >90)
Sodium: 139 mEq/L (ref 135–145)

## 2013-04-26 LAB — CBC
MCH: 31.4 pg (ref 26.0–34.0)
Platelets: 448 10*3/uL — ABNORMAL HIGH (ref 150–400)
RBC: 3.76 MIL/uL — ABNORMAL LOW (ref 4.22–5.81)
RDW: 14.7 % (ref 11.5–15.5)
WBC: 12 10*3/uL — ABNORMAL HIGH (ref 4.0–10.5)

## 2013-04-26 MED ORDER — WARFARIN SODIUM 5 MG PO TABS
5.0000 mg | ORAL_TABLET | Freq: Once | ORAL | Status: AC
  Administered 2013-04-26: 5 mg via ORAL
  Filled 2013-04-26: qty 1

## 2013-04-26 MED ORDER — POTASSIUM CHLORIDE 10 MEQ/100ML IV SOLN
10.0000 meq | INTRAVENOUS | Status: AC
  Administered 2013-04-26 (×4): 10 meq via INTRAVENOUS
  Filled 2013-04-26 (×4): qty 100

## 2013-04-26 NOTE — Progress Notes (Addendum)
ANTICOAGULATION CONSULT NOTE  Pharmacy Consult for warfarin Indication: ATIII Deficiency with arterial thrombi  Allergies  Allergen Reactions  . Mesalamine     REACTION: Rash   Patient Measurements: Height: 5\' 9"  (175.3 cm) Weight: 208 lb 7.5 oz (94.56 kg) IBW/kg (Calculated) : 70.7  Vital Signs: Temp: 99.1 F (37.3 C) (09/20 0500) Temp src: Oral (09/20 0500) BP: 154/93 mmHg (09/20 0500) Pulse Rate: 92 (09/20 0500)  Labs:  Recent Labs  04/24/13 0425 04/25/13 0420 04/26/13 0514  HGB 11.9* 11.7* 11.8*  HCT 35.5* 34.7* 34.6*  PLT 369 424* 448*  LABPROT 19.5* 22.0* 22.5*  INR 1.70* 1.99* 2.05*  CREATININE 0.74 0.81 0.68   Estimated Creatinine Clearance: 119.9 ml/min (by C-G formula based on Cr of 0.68).  Assessment: 57 yo male s/p ileostomy takedown with ileorectal anastomosis was on warfarin 5mg  daily except 7.5mg  MonSat PTA for ATIII deficiency with arterial thrombi that was stopped 5 days prior to surgery and Lovenox 135mg  daily started 9/9 thru 9/11 AM then surgery on 9/12. Warfarin was restarted 9/12 per pharmacy dosing and started SQ heparin 5k q8h post-op for VTE prophylaxis that was transitioned to full dose Lovenox per pharmacy dosing on 9/13 (no bleeding 24hrs post op.)  Lovenox discontinued  INR is therapeutic at 2.05.   Inpatient warfarin doses so far-  5mg  x 3 days, 7.5mg , 10mg , 10mg , 5 mg, 5mg   SCr and Hgb stable.  No reports of bleeding.  Continues clear liquid started 9/18 due to abd distension   Goal of Therapy:  INR 2-3 Monitor platelets by anticoagulation protocol: Yes   Plan:   Warfarin 5mg  today  Daily PT/INR   Otho Bellows PharmD Pager #: [phone] 11:49 AM 04/26/2013

## 2013-04-26 NOTE — Progress Notes (Signed)
8 Days Post-Op  Subjective: Tolerating clears and moving bowels.  No nausea or vomiting  Objective: Vital signs in last 24 hours: Temp:  [98.4 F (36.9 C)-99.1 F (37.3 C)] 99.1 F (37.3 C) (09/20 0500) Pulse Rate:  [90-92] 92 (09/20 0500) Resp:  [18] 18 (09/20 0500) BP: (154-170)/(93-102) 154/93 mmHg (09/20 0500) SpO2:  [95 %-96 %] 95 % (09/20 0500) Last BM Date: 04/25/13  Intake/Output from previous day: 09/19 0701 - 09/20 0700 In: 2545 [P.O.:720; I.V.:1825] Out: 75 [Drains:75] Intake/Output this shift:    General appearance: alert, cooperative and no distress Resp: clear to auscultation bilaterally Cardio: regular rate and rhythm, S1, S2 normal, no murmur, click, rub or gallop GI: soft, minimal tenderness, +moderate distension and tympany, wound vac in midline, ostomy site okay, he has a small amount of discharge from this wound but no erythema or tenderness, no peritoneal signs Extremities: SCD's bilat  Lab Results:   Recent Labs  04/25/13 0420 04/26/13 0514  WBC 9.5 12.0*  HGB 11.7* 11.8*  HCT 34.7* 34.6*  PLT 424* 448*   BMET  Recent Labs  04/25/13 0420 04/26/13 0514  NA 141 139  K 3.7 3.1*  CL 107 106  CO2 26 23  GLUCOSE 120* 130*  BUN 6 5*  CREATININE 0.81 0.68  CALCIUM 8.5 8.3*   PT/INR  Recent Labs  04/25/13 0420 04/26/13 0514  LABPROT 22.0* 22.5*  INR 1.99* 2.05*   ABG No results found for this basename: PHART, PCO2, PO2, HCO3,  in the last 72 hours  Studies/Results: Dg Abd 1 View  04/24/2013   CLINICAL DATA:  Reversed colostomy. Abdominal pain. Postop abdominal distention.  EXAM: ABDOMEN - 1 VIEW  COMPARISON:  Abdomen radiographs, 03/07/2012 and CT, 11/17/2011.  FINDINGS: There are dilated loops of small bowel within the central abdomen and right mid abdomen. The stomach is mildly distended with air.  There are surgical vascular clips in the right mid abdomen, stable from the prior study. There are new overlying skin staples.  Two skin  staples are also noted superimposed over the right pelvis.  IMPRESSION: Dilated loops of small bowel. This may reflect a diffuse postoperative adynamic ileus, favored, versus a partial obstruction.   Electronically Signed   By: Amie Portland   On: 04/24/2013 15:42    Anti-infectives: Anti-infectives   Start     Dose/Rate Route Frequency Ordered Stop   04/18/13 0747  cefOXitin (MEFOXIN) 2 g in dextrose 5 % 50 mL IVPB     2 g 100 mL/hr over 30 Minutes Intravenous On call to O.R. 04/18/13 0747 04/18/13 1144      Assessment/Plan: s/p Procedure(s): ILEOSTOMY TAKEDOWN (N/A) He is tolerating clears but I am hesitant to advance diet because of his distension.  He would like to stay on clears for now as well. Wound vac change today.  INR now >2, stop lovenox and continue coumadin   LOS: 8 days    Lodema Pilot DAVID 04/26/2013

## 2013-04-27 LAB — CBC
HCT: 33.9 % — ABNORMAL LOW (ref 39.0–52.0)
Hemoglobin: 11.2 g/dL — ABNORMAL LOW (ref 13.0–17.0)
MCHC: 33 g/dL (ref 30.0–36.0)
MCV: 92.1 fL (ref 78.0–100.0)
Platelets: 481 10*3/uL — ABNORMAL HIGH (ref 150–400)
RBC: 3.68 MIL/uL — ABNORMAL LOW (ref 4.22–5.81)
WBC: 12.6 10*3/uL — ABNORMAL HIGH (ref 4.0–10.5)

## 2013-04-27 LAB — BASIC METABOLIC PANEL
BUN: 4 mg/dL — ABNORMAL LOW (ref 6–23)
CO2: 26 mEq/L (ref 19–32)
Calcium: 8.2 mg/dL — ABNORMAL LOW (ref 8.4–10.5)
Chloride: 103 mEq/L (ref 96–112)
Creatinine, Ser: 0.8 mg/dL (ref 0.50–1.35)
GFR calc Af Amer: >90 mL/min (ref >90)
Glucose, Bld: 128 mg/dL — ABNORMAL HIGH (ref 70–99)

## 2013-04-27 MED ORDER — POTASSIUM CHLORIDE 10 MEQ/100ML IV SOLN
10.0000 meq | INTRAVENOUS | Status: AC
  Administered 2013-04-27 (×2): 10 meq via INTRAVENOUS
  Filled 2013-04-27 (×2): qty 100

## 2013-04-27 MED ORDER — WARFARIN SODIUM 6 MG PO TABS
6.0000 mg | ORAL_TABLET | Freq: Once | ORAL | Status: AC
  Administered 2013-04-27: 6 mg via ORAL
  Filled 2013-04-27: qty 1

## 2013-04-27 MED ORDER — POTASSIUM CHLORIDE CRYS ER 20 MEQ PO TBCR
40.0000 meq | EXTENDED_RELEASE_TABLET | Freq: Two times a day (BID) | ORAL | Status: DC
  Administered 2013-04-27 – 2013-04-29 (×6): 40 meq via ORAL
  Filled 2013-04-27 (×8): qty 2

## 2013-04-27 NOTE — Progress Notes (Signed)
ANTICOAGULATION CONSULT NOTE  Pharmacy Consult for warfarin Indication: ATIII Deficiency with arterial thrombi  Allergies  Allergen Reactions  . Mesalamine     REACTION: Rash   Patient Measurements: Height: 5\' 9"  (175.3 cm) Weight: 208 lb 7.5 oz (94.56 kg) IBW/kg (Calculated) : 70.7  Vital Signs: Temp: 99 F (37.2 C) (09/21 0538) Temp src: Oral (09/21 0538) BP: 147/90 mmHg (09/21 0538) Pulse Rate: 92 (09/21 0538)  Labs:  Recent Labs  04/25/13 0420 04/26/13 0514 04/27/13 0455  HGB 11.7* 11.8* 11.2*  HCT 34.7* 34.6* 33.9*  PLT 424* 448* 481*  LABPROT 22.0* 22.5* 21.5*  INR 1.99* 2.05* 1.93*  CREATININE 0.81 0.68 0.80   Estimated Creatinine Clearance: 119.9 ml/min (by C-G formula based on Cr of 0.8).  Assessment: 57 yo male s/p ileostomy takedown with ileorectal anastomosis was on warfarin 5mg  daily except 7.5mg  MonSat PTA for ATIII deficiency with arterial thrombi that was stopped 5 days prior to surgery and Lovenox 135mg  daily started 9/9 thru 9/11 AM then surgery on 9/12. Warfarin was restarted 9/12 per pharmacy dosing and started SQ heparin 5k q8h post-op for VTE prophylaxis that was transitioned to full dose Lovenox per pharmacy dosing on 9/13 (no bleeding 24hrs post op.)  Lovenox discontinued  INR is slightly sub-therapeutic at 1.93.   Inpatient warfarin doses so far-  5mg  x 3 days, 7.5mg , 10mg , 10mg , 5 mg x 3 days  SCr and Hgb stable.  No reports of bleeding.  Diet advanced to Full liquid   Goal of Therapy:  INR 2-3   Plan:   Warfarin 6mg  today at 12n  Daily PT/INR   Otho Bellows PharmD Pager #: [phone] 9:48 AM 04/27/2013

## 2013-04-27 NOTE — Progress Notes (Addendum)
9 Days Post-Op  Subjective: Tolerating clears.  +flatus and +BM  Objective: Vital signs in last 24 hours: Temp:  [99 F (37.2 C)-100.7 F (38.2 C)] 99 F (37.2 C) (09/21 0538) Pulse Rate:  [92-110] 92 (09/21 0538) Resp:  [18] 18 (09/21 0538) BP: (147-181)/(90-99) 147/90 mmHg (09/21 0538) SpO2:  [95 %-96 %] 96 % (09/21 0538) Last BM Date: 04/26/13  Intake/Output from previous day: 09/20 0701 - 09/21 0700 In: 3160 [P.O.:960; I.V.:1800; IV Piggyback:400] Out: 100 [Drains:100] Intake/Output this shift:    General appearance: alert, cooperative and no distress Resp: nonlabored GI: soft, nontender, distension unchanged (he says that he is at his baseline), wound vac in place, ostomy site okay, no peritoneal signs  Lab Results:   Recent Labs  04/26/13 0514 04/27/13 0455  WBC 12.0* 12.6*  HGB 11.8* 11.2*  HCT 34.6* 33.9*  PLT 448* 481*   BMET  Recent Labs  04/26/13 0514 04/27/13 0455  NA 139 136  K 3.1* 3.3*  CL 106 103  CO2 23 26  GLUCOSE 130* 128*  BUN 5* 4*  CREATININE 0.68 0.80  CALCIUM 8.3* 8.2*   PT/INR  Recent Labs  04/26/13 0514 04/27/13 0455  LABPROT 22.5* 21.5*  INR 2.05* 1.93*   ABG No results found for this basename: PHART, PCO2, PO2, HCO3,  in the last 72 hours  Studies/Results: No results found.  Anti-infectives: Anti-infectives   Start     Dose/Rate Route Frequency Ordered Stop   04/18/13 0747  cefOXitin (MEFOXIN) 2 g in dextrose 5 % 50 mL IVPB     2 g 100 mL/hr over 30 Minutes Intravenous On call to O.R. 04/18/13 0747 04/18/13 1144      Assessment/Plan: s/p Procedure(s): ILEOSTOMY TAKEDOWN (N/A) He is still distended but he says that he is at his baseline and tolerating diet without nausea or vomiting.  advance diet as tolerated  LOS: 9 days    Adelisa Satterwhite DAVID 04/27/2013 WBC up but no sign of infection.  Continue to monitor this.  Replete lytes

## 2013-04-27 NOTE — Progress Notes (Signed)
Patient states he used fentanyl pain patch at discharge before and that it did well for him. He would like md to consider at fentanyl patch when he is  discharged this time also for pain control.

## 2013-04-28 LAB — CBC
Hemoglobin: 10.4 g/dL — ABNORMAL LOW (ref 13.0–17.0)
MCH: 30.4 pg (ref 26.0–34.0)
MCV: 92.4 fL (ref 78.0–100.0)
RBC: 3.42 MIL/uL — ABNORMAL LOW (ref 4.22–5.81)

## 2013-04-28 LAB — PROTIME-INR: INR: 2.11 — ABNORMAL HIGH (ref 0.00–1.49)

## 2013-04-28 LAB — BASIC METABOLIC PANEL
CO2: 24 mEq/L (ref 19–32)
Calcium: 8.4 mg/dL (ref 8.4–10.5)
Chloride: 104 mEq/L (ref 96–112)
Creatinine, Ser: 0.74 mg/dL (ref 0.50–1.35)
GFR calc Af Amer: >90 mL/min (ref >90)
Glucose, Bld: 125 mg/dL — ABNORMAL HIGH (ref 70–99)

## 2013-04-28 MED ORDER — WARFARIN SODIUM 5 MG PO TABS
5.0000 mg | ORAL_TABLET | Freq: Once | ORAL | Status: AC
  Administered 2013-04-28: 5 mg via ORAL
  Filled 2013-04-28: qty 1

## 2013-04-28 NOTE — Progress Notes (Signed)
I agree with the assessment and plan noted above.  Clance Boll, PharmD, BCPS Pager: [phone] 04/28/2013 11:07 AM

## 2013-04-28 NOTE — Progress Notes (Signed)
Patient ID: Rick Edwards, male   DOB: [birth year], 57 y.o.   MRN: [id number] 10 Days Post-Op  Subjective: Feels pretty well today. He has some pain around his incision related to coughing or moving but otherwise no significant discomfort. He is tolerating full liquids without nausea or vomiting. He has had bowel movements which now are more formed with a "good bowel movement" this morning. He feels his abdominal distention is improved.  Objective: Vital signs in last 24 hours: Temp:  [98.2 F (36.8 C)-99.3 F (37.4 C)] 98.7 F (37.1 C) (09/22 1010) Pulse Rate:  [90-100] 100 (09/22 1010) Resp:  [18-20] 18 (09/22 1010) BP: (146-162)/(87-89) 162/87 mmHg (09/22 1010) SpO2:  [95 %-99 %] 99 % (09/22 1010) Last BM Date: 04/28/13  Intake/Output from previous day: 09/21 0701 - 09/22 0700 In: 720 [P.O.:720] Out: 150 [Drains:150] Intake/Output this shift: Total I/O In: 240 [P.O.:240] Out: -   General appearance: alert, cooperative and no distress Resp: clear to auscultation bilaterally GI: significantly less distended than my previous exams now minimal. Nontender. Bowel sounds are active. Extremities: no edema, redness or tenderness in the calves or thighs Incision/Wound: VAC dressing in place without erythema or unusual drainage. Ostomy site wound healing without evidence of infection  Lab Results:   Recent Labs  04/27/13 0455 04/28/13 0510  WBC 12.6* 13.3*  HGB 11.2* 10.4*  HCT 33.9* 31.6*  PLT 481* 512*   BMET  Recent Labs  04/27/13 0455 04/28/13 0510  NA 136 137  K 3.3* 3.1*  CL 103 104  CO2 26 24  GLUCOSE 128* 125*  BUN 4* 5*  CREATININE 0.80 0.74  CALCIUM 8.2* 8.4   Lab Results  Component Value Date   INR 2.11* 04/28/2013   INR 1.93* 04/27/2013   INR 2.05* 04/26/2013     Studies/Results: No results found.  Anti-infectives: Anti-infectives   Start     Dose/Rate Route Frequency Ordered Stop   04/18/13 0747  cefOXitin (MEFOXIN) 2 g in dextrose 5 % 50 mL  IVPB     2 g 100 mL/hr over 30 Minutes Intravenous On call to O.R. 04/18/13 0747 04/18/13 1144      Assessment/Plan: s/p Procedure(s): ILEOSTOMY TAKEDOWN He overall looks better. Tolerating a diet without nausea and his abdominal distention is resolving. Good bowel function. Wound infection with VAC dressing in place. Due for change today. Mildly elevated white blood count which is again up a little bit today. I can find no evidence for infection clinically. Will observe this for another day. Check wound today and if his white count is up any further tomorrow consider CT scan Hypokalemia-replace Antithrombin III deficiency now therapeutic on warfarin and no evidence of bleeding or thrombosis I hope he will be ready for discharge later this week.    LOS: 10 days    Kammy Klett T 04/28/2013

## 2013-04-28 NOTE — Progress Notes (Signed)
Changed wound vac cartridge. 375 total in canister.

## 2013-04-28 NOTE — Progress Notes (Signed)
ANTICOAGULATION CONSULT NOTE  Pharmacy Consult for warfarin Indication: ATIII Deficiency with arterial thrombi  Allergies  Allergen Reactions  . Mesalamine     REACTION: Rash   Patient Measurements: Height: 5\' 9"  (175.3 cm) Weight: 208 lb 7.5 oz (94.56 kg) IBW/kg (Calculated) : 70.7  Vital Signs: Temp: 98.7 F (37.1 C) (09/22 1010) Temp src: Axillary (09/22 1010) BP: 162/87 mmHg (09/22 1010) Pulse Rate: 100 (09/22 1010)  Labs:  Recent Labs  04/26/13 0514 04/27/13 0455 04/28/13 0510  HGB 11.8* 11.2* 10.4*  HCT 34.6* 33.9* 31.6*  PLT 448* 481* 512*  LABPROT 22.5* 21.5* 23.0*  INR 2.05* 1.93* 2.11*  CREATININE 0.68 0.80 0.74   Estimated Creatinine Clearance: 119.9 ml/min (by C-G formula based on Cr of 0.74).  Assessment: 57 yo male s/p ileostomy takedown with ileorectal anastomosis was on warfarin 5mg  daily except 7.5mg  MonSat PTA for ATIII deficiency with arterial thrombi that was stopped 5 days prior to surgery and Lovenox 135mg  daily started 9/9 thru 9/11 AM then surgery on 9/12. Warfarin was restarted 9/12 per pharmacy dosing and started SQ heparin 5k q8h post-op for VTE prophylaxis that was transitioned to full dose Lovenox per pharmacy dosing on 9/13 (no bleeding 24hrs post op.)  Lovenox discontinued  INR is slightly therapeutic at 2.11.   Inpatient warfarin doses so far-  5mg  x 3 days, 7.5mg , 10mg , 10mg , 5 mg x 3 days, 6mg   Hgb 10.4.  No reports of bleeding.  Diet advanced to Full liquid   Goal of Therapy:  INR 2-3   Plan:   Warfarin 5mg  today  Daily PT/INR   Ree Alcalde PharmD Candidate 11:02 AM 04/28/2013

## 2013-04-29 ENCOUNTER — Encounter (HOSPITAL_COMMUNITY): Payer: Self-pay

## 2013-04-29 LAB — BASIC METABOLIC PANEL
BUN: 6 mg/dL (ref 6–23)
CO2: 25 mEq/L (ref 19–32)
Chloride: 102 mEq/L (ref 96–112)
GFR calc Af Amer: >90 mL/min (ref >90)
GFR calc non Af Amer: >90 mL/min (ref >90)
Glucose, Bld: 111 mg/dL — ABNORMAL HIGH (ref 70–99)
Potassium: 3.5 mEq/L (ref 3.5–5.1)

## 2013-04-29 LAB — CBC
HCT: 33.5 % — ABNORMAL LOW (ref 39.0–52.0)
Hemoglobin: 11.2 g/dL — ABNORMAL LOW (ref 13.0–17.0)
MCHC: 33.4 g/dL (ref 30.0–36.0)
Platelets: 606 10*3/uL — ABNORMAL HIGH (ref 150–400)
RBC: 3.68 MIL/uL — ABNORMAL LOW (ref 4.22–5.81)
WBC: 11.9 10*3/uL — ABNORMAL HIGH (ref 4.0–10.5)

## 2013-04-29 LAB — PROTIME-INR: INR: 2.15 — ABNORMAL HIGH (ref 0.00–1.49)

## 2013-04-29 MED ORDER — WARFARIN SODIUM 5 MG PO TABS
5.0000 mg | ORAL_TABLET | Freq: Once | ORAL | Status: AC
  Administered 2013-04-29: 5 mg via ORAL
  Filled 2013-04-29: qty 1

## 2013-04-29 NOTE — Progress Notes (Signed)
ANTICOAGULATION CONSULT NOTE  Pharmacy Consult for warfarin Indication: ATIII Deficiency with arterial thrombi  Allergies  Allergen Reactions  . Mesalamine     REACTION: Rash   Patient Measurements: Height: 5\' 9"  (175.3 cm) Weight: 208 lb 7.5 oz (94.56 kg) IBW/kg (Calculated) : 70.7  Vital Signs: Temp: 98.7 F (37.1 C) (09/23 0605) Temp src: Oral (09/23 0605) BP: 167/92 mmHg (09/23 0605) Pulse Rate: 92 (09/23 0605)  Labs:  Recent Labs  04/27/13 0455 04/28/13 0510 04/29/13 0455  HGB 11.2* 10.4* 11.2*  HCT 33.9* 31.6* 33.5*  PLT 481* 512* 606*  LABPROT 21.5* 23.0* 23.3*  INR 1.93* 2.11* 2.15*  CREATININE 0.80 0.74 0.69   Estimated Creatinine Clearance: 119.9 ml/min (by C-G formula based on Cr of 0.69).  Assessment: 57 yo male s/p ileostomy takedown with ileorectal anastomosis was on warfarin 5mg  daily except 7.5mg  MonSat PTA for ATIII deficiency with arterial thrombi that was stopped 5 days prior to surgery and Lovenox 135mg  daily started 9/9 thru 9/11 AM then surgery on 9/12. Warfarin was restarted 9/12 per pharmacy dosing and started SQ heparin 5k q8h post-op for VTE prophylaxis that was transitioned to full dose Lovenox per pharmacy dosing on 9/13 (no bleeding 24hrs post op.)  Lovenox discontinued  INR is therapeutic at 2.15.   Inpatient warfarin doses so far-  5mg  x 3 days, 7.5mg , 10mg , 10mg , 5 mg x 3 days, 6mg , 5mg   Hgb 11.2,  No reports of bleeding.  Diet advanced to Full liquid   Goal of Therapy:  INR 2-3   Plan:   Warfarin 5mg  today  Daily PT/INR   Abbygael Curtiss PharmD Candidate 7:43 AM 04/29/2013

## 2013-04-29 NOTE — Progress Notes (Signed)
Patient ID: Rick Edwards, male   DOB: Dec 12, 1955, 57 y.o.   MRN: [id number] 11 Days Post-Op  Subjective: Some pain, gradually improving. Tol FL but not eating much.  +BM  Objective: Vital signs in last 24 hours: Temp:  [98.2 F (36.8 C)-99.7 F (37.6 C)] 98.2 F (36.8 C) (09/23 0820) Pulse Rate:  [88-96] 88 (09/23 0820) Resp:  [18] 18 (09/23 0605) BP: (140-167)/(84-92) 156/92 mmHg (09/23 0820) SpO2:  [95 %-96 %] 95 % (09/23 0820) Last BM Date: 04/29/13  Intake/Output from previous day: 09/22 0701 - 09/23 0700 In: 920 [P.O.:920] Out: 0  Intake/Output this shift: Total I/O In: 340 [P.O.:340] Out: -   General appearance: alert, cooperative and no distress GI: Mild ditention, NT Incision/Wound: VAC removed, clean granulation  Lab Results:   Recent Labs  04/28/13 0510 04/29/13 0455  WBC 13.3* 11.9*  HGB 10.4* 11.2*  HCT 31.6* 33.5*  PLT 512* 606*   BMET  Recent Labs  04/28/13 0510 04/29/13 0455  NA 137 137  K 3.1* 3.5  CL 104 102  CO2 24 25  GLUCOSE 125* 111*  BUN 5* 6  CREATININE 0.74 0.69  CALCIUM 8.4 8.3*   Lab Results  Component Value Date   INR 2.15* 04/29/2013   INR 2.11* 04/28/2013   INR 1.93* 04/27/2013     Studies/Results: No results found.  Anti-infectives: Anti-infectives   Start     Dose/Rate Route Frequency Ordered Stop   04/18/13 0747  cefOXitin (MEFOXIN) 2 g in dextrose 5 % 50 mL IVPB     2 g 100 mL/hr over 30 Minutes Intravenous On call to O.R. 04/18/13 0747 04/18/13 1144      Assessment/Plan: s/p Procedure(s): ILEOSTOMY TAKEDOWN Continued improvement.  Offer regular diet.  Possibly home soon   LOS: 11 days    Rick Edwards T 04/29/2013

## 2013-04-29 NOTE — Progress Notes (Signed)
I agree with the assessment and plan noted above.  Clance Boll, PharmD, BCPS Pager: [phone] 04/29/2013 7:48 AM

## 2013-04-30 ENCOUNTER — Inpatient Hospital Stay (HOSPITAL_COMMUNITY): Payer: No Typology Code available for payment source

## 2013-04-30 ENCOUNTER — Encounter (HOSPITAL_COMMUNITY): Payer: Self-pay | Admitting: Radiology

## 2013-04-30 LAB — URINALYSIS, ROUTINE W REFLEX MICROSCOPIC
Glucose, UA: NEGATIVE mg/dL
Ketones, ur: NEGATIVE mg/dL
Leukocytes, UA: NEGATIVE
Protein, ur: 30 mg/dL — AB
Specific Gravity, Urine: >1.046 — ABNORMAL HIGH (ref 1.005–1.030)
pH: 5 (ref 5.0–8.0)

## 2013-04-30 LAB — BASIC METABOLIC PANEL
BUN: 8 mg/dL (ref 6–23)
CO2: 25 mEq/L (ref 19–32)
Calcium: 8.7 mg/dL (ref 8.4–10.5)
Chloride: 100 mEq/L (ref 96–112)
Creatinine, Ser: 0.78 mg/dL (ref 0.50–1.35)
GFR calc Af Amer: >90 mL/min (ref >90)
Glucose, Bld: 119 mg/dL — ABNORMAL HIGH (ref 70–99)
Potassium: 3.7 mEq/L (ref 3.5–5.1)

## 2013-04-30 LAB — CBC
HCT: 34.2 % — ABNORMAL LOW (ref 39.0–52.0)
Hemoglobin: 11.5 g/dL — ABNORMAL LOW (ref 13.0–17.0)
MCV: 91 fL (ref 78.0–100.0)
RBC: 3.76 MIL/uL — ABNORMAL LOW (ref 4.22–5.81)
RDW: 14.8 % (ref 11.5–15.5)
WBC: 16.6 10*3/uL — ABNORMAL HIGH (ref 4.0–10.5)

## 2013-04-30 LAB — URINE MICROSCOPIC-ADD ON

## 2013-04-30 MED ORDER — VITAMIN K1 10 MG/ML IJ SOLN
5.0000 mg | Freq: Once | INTRAVENOUS | Status: AC
  Administered 2013-04-30: 5 mg via INTRAVENOUS
  Filled 2013-04-30: qty 0.5

## 2013-04-30 MED ORDER — IOHEXOL 300 MG/ML  SOLN
25.0000 mL | INTRAMUSCULAR | Status: AC
Start: 2013-04-30 — End: 2013-04-30
  Administered 2013-04-30 (×2): 25 mL via ORAL

## 2013-04-30 MED ORDER — IOHEXOL 300 MG/ML  SOLN
100.0000 mL | Freq: Once | INTRAMUSCULAR | Status: AC | PRN
  Administered 2013-04-30: 100 mL via INTRAVENOUS

## 2013-04-30 MED ORDER — PIPERACILLIN-TAZOBACTAM 3.375 G IVPB
3.3750 g | Freq: Three times a day (TID) | INTRAVENOUS | Status: DC
  Administered 2013-04-30 – 2013-05-09 (×28): 3.375 g via INTRAVENOUS
  Filled 2013-04-30 (×29): qty 50

## 2013-04-30 MED ORDER — KCL IN DEXTROSE-NACL 20-5-0.9 MEQ/L-%-% IV SOLN
INTRAVENOUS | Status: DC
  Administered 2013-04-30 – 2013-05-02 (×2): via INTRAVENOUS
  Administered 2013-05-04: 20 mL/h via INTRAVENOUS
  Administered 2013-05-05 – 2013-05-08 (×2): via INTRAVENOUS
  Filled 2013-04-30 (×14): qty 1000

## 2013-04-30 MED ORDER — WARFARIN SODIUM 5 MG PO TABS
5.0000 mg | ORAL_TABLET | Freq: Once | ORAL | Status: DC
  Filled 2013-04-30: qty 1

## 2013-04-30 NOTE — Progress Notes (Signed)
Agree with PA note.  Signed,  Croix Presley K. Angelene Rome, MD [facility]  

## 2013-04-30 NOTE — Progress Notes (Signed)
Patient ID: Rick Edwards, male   DOB: [birth year], 57 y.o.   MRN: [id number] 12 Days Post-Op  Subjective: No new complaints. Still some left mid abdominal pain related to activity. He says it is no worse. Tolerated a regular diet pretty well eating most of the inner last night. Continues to have semi-formed bowel movements. No nausea or vomiting.  Objective: Vital signs in last 24 hours: Temp:  [98.1 F (36.7 C)-99.4 F (37.4 C)] 98.4 F (36.9 C) (09/24 0543) Pulse Rate:  [84-95] 88 (09/24 0543) Resp:  [19-20] 19 (09/24 0543) BP: (147-159)/(75-96) 159/96 mmHg (09/24 0543) SpO2:  [95 %-100 %] 97 % (09/24 0543) Last BM Date: 04/29/13  Intake/Output from previous day: 09/23 0701 - 09/24 0700 In: 580 [P.O.:580] Out: 500 [Urine:500] Intake/Output this shift:    General appearance: alert, cooperative and no distress Resp: clear to auscultation bilaterally GI: Seems to be a little more distended again today but not tight. Some high pitched bowel sounds. Nontender. No masses. VAC in place with serosanguineous drainage and no erythema of the wound.  Lab Results:   Recent Labs  04/29/13 0455 04/30/13 0500  WBC 11.9* 16.6*  HGB 11.2* 11.5*  HCT 33.5* 34.2*  PLT 606* 682*   BMET  Recent Labs  04/29/13 0455 04/30/13 0500  NA 137 135  K 3.5 3.7  CL 102 100  CO2 25 25  GLUCOSE 111* 119*  BUN 6 8  CREATININE 0.69 0.78  CALCIUM 8.3* 8.7     Studies/Results: No results found.  Anti-infectives: Anti-infectives   Start     Dose/Rate Route Frequency Ordered Stop   04/18/13 0747  cefOXitin (MEFOXIN) 2 g in dextrose 5 % 50 mL IVPB     2 g 100 mL/hr over 30 Minutes Intravenous On call to O.R. 04/18/13 0747 04/18/13 1144      Assessment/Plan: s/p Procedure(s): ILEOSTOMY TAKEDOWN Patient appears stable but has some persistent distention and some degree of abdominal pain on the left side. He now has elevated white blood count. Will obtain CT scan of abdomen and pelvis  to rule out abscess and assess for possible partial bowel obstruction. Check chest x-ray and urinalysis. I'm going to start empiric antibiotics while the workup is in progress.   LOS: 12 days    Hermon Zea T 04/30/2013

## 2013-04-30 NOTE — Progress Notes (Signed)
ANTICOAGULATION CONSULT NOTE  Pharmacy Consult for warfarin Indication:  AT III deficiency, hx arterial thromboembolism  Allergies  Allergen Reactions  . Mesalamine     REACTION: Rash   Patient Measurements: Height: 5\' 9"  (175.3 cm) Weight: 208 lb 7.5 oz (94.56 kg) IBW/kg (Calculated) : 70.7  Vital Signs: Temp: 98.4 F (36.9 C) (09/24 0543) Temp src: Axillary (09/24 0543) BP: 159/96 mmHg (09/24 0543) Pulse Rate: 88 (09/24 0543)  Labs:  Recent Labs  04/28/13 0510 04/29/13 0455 04/30/13 0500  HGB 10.4* 11.2* 11.5*  HCT 31.6* 33.5* 34.2*  PLT 512* 606* 682*  LABPROT 23.0* 23.3* 24.3*  INR 2.11* 2.15* 2.27*  CREATININE 0.74 0.69 0.78   Estimated Creatinine Clearance: 119.9 ml/min (by C-G formula based on Cr of 0.78).  Assessment: 57 yo male s/p ileostomy takedown with ileorectal anastomosis, was on warfarin PTA for ATIII deficiency with arterial thrombi.  Warfarin was stopped 5 days prior to surgery and Lovenox 135mg  SQ daily administered 9/9 thru 9/11 AM, then surgery on 9/12. Warfarin was restarted 9/12 with pharmacy dosing.  Received low-dose SQ heparin then full-dose LMWH until INR therapeutic.  PTA warfarin dose from 9/5 anticoag visit: 7.5mg  daily except 10mg  Wed. PTA warfarin dose reported by patient: 7.5mg  daily except 10mg  Wed/Sat.. ..  INR is therapeutic at 2.27.   Inpatient warfarin doses so far (9/12-9/23):  5mg  x 3 days, 7.5mg , 10mg , 10mg , 5 mg x 3 days, 6mg , 5mg , 5mg   Hgb 11.5,  No reports of bleeding.  Diet NPO starting 9/24 AM - CT abdomen/pelvis ordered   Goal of Therapy:  INR 2-3   Plan:   Warfarin 5mg  today  Daily PT/INR   Radigan,Megan PharmD Candidate 10:00 AM 04/30/2013  I agree with the above findings, assessment, and plan. Elie Goody, PharmD, BCPS Pager: [phone] 04/30/2013  10:19 AM

## 2013-04-30 NOTE — Progress Notes (Signed)
CT scan shows an intraabdominal fluid collection that Dr. Johna Sheriff would like IR to see and possibly drain.  He is on coumadin.  I will hold PM coumadin and give him some Vitamin K.  We have requested IR drainage.

## 2013-04-30 NOTE — Progress Notes (Signed)
Patient ID: Rick Edwards, male   DOB: Oct 07, 1955, 57 y.o.   MRN: [id number] Request received for CT guided aspiration/possible drainage of pelvic fluid collection in pt with hx of Chron's colitis, s/p recent ileostomy takedown with ileorectal anastomosis. Imaging studies were reviewed by Dr. Grace Isaac. Additional PMH as below. Exam: pt awake/alert; chest- sl dim BS bases; heart- RRR; abd- dist, taut, few BS, tender LLQ/pelvic regions; ext- no edema.  Pt also on coumadin due to hx of antithrombin III deficiency. Current PT/INR  24.3/2.27 Filed Vitals:   04/29/13 1417 04/29/13 2138 04/30/13 0543 04/30/13 1400  BP: 150/88 147/75 159/96 170/96  Pulse: 84 95 88 93  Temp: 98.1 F (36.7 C) 99.4 F (37.4 C) 98.4 F (36.9 C) 98.6 F (37 C)  TempSrc: Oral Oral Axillary Oral  Resp: 20 20 19 18   Height:      Weight:      SpO2: 97% 100% 97% 96%   Past Medical History  Diagnosis Date  . Crohn's disease     tx. Imuran  . Perianal abscess 2001    s/p right hemicolectomy, and drainage of retroperitoneal abcess 2003  . Hypertension   . Hearing loss     wears hearing aid left ear; birthed with hearing loss  . GI bleed 6/11    w/ normal EGD 6/11, 3 PRBCs   . Anemia     anemia thrombocytopenia, saw Hematology 2011, can not r/o myeloproliferative d/c  . Transfusion history     last 8'13  . Ileostomy in place 04-11-13    04-18-13 ileostomy to be taken down.   Past Surgical History  Procedure Laterality Date  . Hemicolectomy  08/29/2001    perforated abscess  . Anal fistulotomy  2002  . Partial colectomy  03/11/2012    Procedure: PARTIAL COLECTOMY;  Surgeon: Mariella Saa, MD;  Location: [facility];  Service: General;  Laterality: N/A;  subtotal colectomy transverse and left colon   . Embolectomy  03/12/2012    Procedure: EMBOLECTOMY;  Surgeon: Chuck Hint, MD;  Location: [facility] OR;  Service: Vascular;  Laterality: Right;  Right popliteal embolectomy with vein patch angioplasty, right posterior  tibial embolectomy with vein patch angioplasty   . Intraoperative arteriogram  03/12/2012    Procedure: INTRA OPERATIVE ARTERIOGRAM;  Surgeon: Chuck Hint, MD;  Location: [facility] OR;  Service: Vascular;  Laterality: Right;  . Transmetatarsal amputation  05/10/2012    right  . Ileostomy closure N/A 04/18/2013    Procedure: ILEOSTOMY TAKEDOWN;  Surgeon: Mariella Saa, MD;  Location: [facility];  Service: General;  Laterality: N/A;   Results for orders placed during the hospital encounter of 04/18/13  PROTIME-INR      Result Value Range   Prothrombin Time 13.6  11.6 - 15.2 seconds   INR 1.06  0.00 - 1.49  POTASSIUM      Result Value Range   Potassium 3.5  3.5 - 5.1 mEq/L  CBC      Result Value Range   WBC 12.3 (*) 4.0 - 10.5 K/uL   RBC 4.62  4.22 - 5.81 MIL/uL   Hemoglobin 14.2  13.0 - 17.0 g/dL   HCT 40.9  81.1 - 91.4 %   MCV 92.4  78.0 - 100.0 fL   MCH 30.7  26.0 - 34.0 pg   MCHC 33.3  30.0 - 36.0 g/dL   RDW 78.2  95.6 - 21.3 %   Platelets 388  150 - 400 K/uL  BASIC METABOLIC PANEL  Result Value Range   Sodium 135  135 - 145 mEq/L   Potassium 4.1  3.5 - 5.1 mEq/L   Chloride 101  96 - 112 mEq/L   CO2 26  19 - 32 mEq/L   Glucose, Bld 141 (*) 70 - 99 mg/dL   BUN 12  6 - 23 mg/dL   Creatinine, Ser 9.60  0.50 - 1.35 mg/dL   Calcium 8.3 (*) 8.4 - 10.5 mg/dL   GFR calc non Af Amer 81 (*) >90 mL/min   GFR calc Af Amer >90  >90 mL/min  PROTIME-INR      Result Value Range   Prothrombin Time 13.3  11.6 - 15.2 seconds   INR 1.03  0.00 - 1.49  CBC      Result Value Range   WBC 10.3  4.0 - 10.5 K/uL   RBC 3.82 (*) 4.22 - 5.81 MIL/uL   Hemoglobin 12.0 (*) 13.0 - 17.0 g/dL   HCT 45.4 (*) 09.8 - 11.9 %   MCV 94.2  78.0 - 100.0 fL   MCH 30.9  26.0 - 34.0 pg   MCHC 32.8  30.0 - 36.0 g/dL   RDW 14.7  82.9 - 56.2 %   Platelets 319  150 - 400 K/uL  BASIC METABOLIC PANEL      Result Value Range   Sodium 137  135 - 145 mEq/L   Potassium 4.0  3.5 - 5.1 mEq/L   Chloride 104   96 - 112 mEq/L   CO2 26  19 - 32 mEq/L   Glucose, Bld 135 (*) 70 - 99 mg/dL   BUN 10  6 - 23 mg/dL   Creatinine, Ser 1.30  0.50 - 1.35 mg/dL   Calcium 8.0 (*) 8.4 - 10.5 mg/dL   GFR calc non Af Amer >90  >90 mL/min   GFR calc Af Amer >90  >90 mL/min  PROTIME-INR      Result Value Range   Prothrombin Time 14.9  11.6 - 15.2 seconds   INR 1.20  0.00 - 1.49  CBC      Result Value Range   WBC 8.8  4.0 - 10.5 K/uL   RBC 3.68 (*) 4.22 - 5.81 MIL/uL   Hemoglobin 11.2 (*) 13.0 - 17.0 g/dL   HCT 86.5 (*) 78.4 - 69.6 %   MCV 94.8  78.0 - 100.0 fL   MCH 30.4  26.0 - 34.0 pg   MCHC 32.1  30.0 - 36.0 g/dL   RDW 29.5  28.4 - 13.2 %   Platelets 272  150 - 400 K/uL  BASIC METABOLIC PANEL      Result Value Range   Sodium 141  135 - 145 mEq/L   Potassium 3.7  3.5 - 5.1 mEq/L   Chloride 107  96 - 112 mEq/L   CO2 26  19 - 32 mEq/L   Glucose, Bld 132 (*) 70 - 99 mg/dL   BUN 8  6 - 23 mg/dL   Creatinine, Ser 4.40  0.50 - 1.35 mg/dL   Calcium 8.2 (*) 8.4 - 10.5 mg/dL   GFR calc non Af Amer >90  >90 mL/min   GFR calc Af Amer >90  >90 mL/min  PROTIME-INR      Result Value Range   Prothrombin Time 15.0  11.6 - 15.2 seconds   INR 1.21  0.00 - 1.49  CBC      Result Value Range   WBC 5.7  4.0 - 10.5 K/uL  RBC 3.82 (*) 4.22 - 5.81 MIL/uL   Hemoglobin 11.8 (*) 13.0 - 17.0 g/dL   HCT 30.8 (*) 65.7 - 84.6 %   MCV 93.5  78.0 - 100.0 fL   MCH 30.9  26.0 - 34.0 pg   MCHC 33.1  30.0 - 36.0 g/dL   RDW 96.2  95.2 - 84.1 %   Platelets 336  150 - 400 K/uL  BASIC METABOLIC PANEL      Result Value Range   Sodium 144  135 - 145 mEq/L   Potassium 3.4 (*) 3.5 - 5.1 mEq/L   Chloride 109  96 - 112 mEq/L   CO2 26  19 - 32 mEq/L   Glucose, Bld 124 (*) 70 - 99 mg/dL   BUN 8  6 - 23 mg/dL   Creatinine, Ser 3.24  0.50 - 1.35 mg/dL   Calcium 8.5  8.4 - 40.1 mg/dL   GFR calc non Af Amer >90  >90 mL/min   GFR calc Af Amer >90  >90 mL/min  PROTIME-INR      Result Value Range   Prothrombin Time 14.8  11.6 -  15.2 seconds   INR 1.19  0.00 - 1.49  CBC      Result Value Range   WBC 6.4  4.0 - 10.5 K/uL   RBC 3.98 (*) 4.22 - 5.81 MIL/uL   Hemoglobin 12.0 (*) 13.0 - 17.0 g/dL   HCT 02.7 (*) 25.3 - 66.4 %   MCV 92.7  78.0 - 100.0 fL   MCH 30.2  26.0 - 34.0 pg   MCHC 32.5  30.0 - 36.0 g/dL   RDW 40.3  47.4 - 25.9 %   Platelets 339  150 - 400 K/uL  BASIC METABOLIC PANEL      Result Value Range   Sodium 141  135 - 145 mEq/L   Potassium 3.4 (*) 3.5 - 5.1 mEq/L   Chloride 106  96 - 112 mEq/L   CO2 25  19 - 32 mEq/L   Glucose, Bld 126 (*) 70 - 99 mg/dL   BUN 8  6 - 23 mg/dL   Creatinine, Ser 5.63  0.50 - 1.35 mg/dL   Calcium 8.5  8.4 - 87.5 mg/dL   GFR calc non Af Amer >90  >90 mL/min   GFR calc Af Amer >90  >90 mL/min  PROTIME-INR      Result Value Range   Prothrombin Time 15.9 (*) 11.6 - 15.2 seconds   INR 1.30  0.00 - 1.49  CBC      Result Value Range   WBC 7.6  4.0 - 10.5 K/uL   RBC 3.88 (*) 4.22 - 5.81 MIL/uL   Hemoglobin 11.9 (*) 13.0 - 17.0 g/dL   HCT 64.3 (*) 32.9 - 51.8 %   MCV 91.5  78.0 - 100.0 fL   MCH 30.7  26.0 - 34.0 pg   MCHC 33.5  30.0 - 36.0 g/dL   RDW 84.1  66.0 - 63.0 %   Platelets 369  150 - 400 K/uL  BASIC METABOLIC PANEL      Result Value Range   Sodium 140  135 - 145 mEq/L   Potassium 2.9 (*) 3.5 - 5.1 mEq/L   Chloride 104  96 - 112 mEq/L   CO2 24  19 - 32 mEq/L   Glucose, Bld 116 (*) 70 - 99 mg/dL   BUN 8  6 - 23 mg/dL   Creatinine, Ser 1.60  0.50 - 1.35 mg/dL  Calcium 8.5  8.4 - 10.5 mg/dL   GFR calc non Af Amer >90  >90 mL/min   GFR calc Af Amer >90  >90 mL/min  PROTIME-INR      Result Value Range   Prothrombin Time 19.5 (*) 11.6 - 15.2 seconds   INR 1.70 (*) 0.00 - 1.49  MAGNESIUM      Result Value Range   Magnesium 2.0  1.5 - 2.5 mg/dL  CBC      Result Value Range   WBC 9.5  4.0 - 10.5 K/uL   RBC 3.77 (*) 4.22 - 5.81 MIL/uL   Hemoglobin 11.7 (*) 13.0 - 17.0 g/dL   HCT 11.9 (*) 14.7 - 82.9 %   MCV 92.0  78.0 - 100.0 fL   MCH 31.0  26.0 -  34.0 pg   MCHC 33.7  30.0 - 36.0 g/dL   RDW 56.2  13.0 - 86.5 %   Platelets 424 (*) 150 - 400 K/uL  BASIC METABOLIC PANEL      Result Value Range   Sodium 141  135 - 145 mEq/L   Potassium 3.7  3.5 - 5.1 mEq/L   Chloride 107  96 - 112 mEq/L   CO2 26  19 - 32 mEq/L   Glucose, Bld 120 (*) 70 - 99 mg/dL   BUN 6  6 - 23 mg/dL   Creatinine, Ser 7.84  0.50 - 1.35 mg/dL   Calcium 8.5  8.4 - 69.6 mg/dL   GFR calc non Af Amer >90  >90 mL/min   GFR calc Af Amer >90  >90 mL/min  PROTIME-INR      Result Value Range   Prothrombin Time 22.0 (*) 11.6 - 15.2 seconds   INR 1.99 (*) 0.00 - 1.49  CBC      Result Value Range   WBC 12.0 (*) 4.0 - 10.5 K/uL   RBC 3.76 (*) 4.22 - 5.81 MIL/uL   Hemoglobin 11.8 (*) 13.0 - 17.0 g/dL   HCT 29.5 (*) 28.4 - 13.2 %   MCV 92.0  78.0 - 100.0 fL   MCH 31.4  26.0 - 34.0 pg   MCHC 34.1  30.0 - 36.0 g/dL   RDW 44.0  10.2 - 72.5 %   Platelets 448 (*) 150 - 400 K/uL  BASIC METABOLIC PANEL      Result Value Range   Sodium 139  135 - 145 mEq/L   Potassium 3.1 (*) 3.5 - 5.1 mEq/L   Chloride 106  96 - 112 mEq/L   CO2 23  19 - 32 mEq/L   Glucose, Bld 130 (*) 70 - 99 mg/dL   BUN 5 (*) 6 - 23 mg/dL   Creatinine, Ser 3.66  0.50 - 1.35 mg/dL   Calcium 8.3 (*) 8.4 - 10.5 mg/dL   GFR calc non Af Amer >90  >90 mL/min   GFR calc Af Amer >90  >90 mL/min  PROTIME-INR      Result Value Range   Prothrombin Time 22.5 (*) 11.6 - 15.2 seconds   INR 2.05 (*) 0.00 - 1.49  CBC      Result Value Range   WBC 12.6 (*) 4.0 - 10.5 K/uL   RBC 3.68 (*) 4.22 - 5.81 MIL/uL   Hemoglobin 11.2 (*) 13.0 - 17.0 g/dL   HCT 44.0 (*) 34.7 - 42.5 %   MCV 92.1  78.0 - 100.0 fL   MCH 30.4  26.0 - 34.0 pg   MCHC 33.0  30.0 - 36.0  g/dL   RDW 16.1  09.6 - 04.5 %   Platelets 481 (*) 150 - 400 K/uL  BASIC METABOLIC PANEL      Result Value Range   Sodium 136  135 - 145 mEq/L   Potassium 3.3 (*) 3.5 - 5.1 mEq/L   Chloride 103  96 - 112 mEq/L   CO2 26  19 - 32 mEq/L   Glucose, Bld 128 (*) 70  - 99 mg/dL   BUN 4 (*) 6 - 23 mg/dL   Creatinine, Ser 4.09  0.50 - 1.35 mg/dL   Calcium 8.2 (*) 8.4 - 10.5 mg/dL   GFR calc non Af Amer >90  >90 mL/min   GFR calc Af Amer >90  >90 mL/min  PROTIME-INR      Result Value Range   Prothrombin Time 21.5 (*) 11.6 - 15.2 seconds   INR 1.93 (*) 0.00 - 1.49  CBC      Result Value Range   WBC 13.3 (*) 4.0 - 10.5 K/uL   RBC 3.42 (*) 4.22 - 5.81 MIL/uL   Hemoglobin 10.4 (*) 13.0 - 17.0 g/dL   HCT 81.1 (*) 91.4 - 78.2 %   MCV 92.4  78.0 - 100.0 fL   MCH 30.4  26.0 - 34.0 pg   MCHC 32.9  30.0 - 36.0 g/dL   RDW 95.6  21.3 - 08.6 %   Platelets 512 (*) 150 - 400 K/uL  BASIC METABOLIC PANEL      Result Value Range   Sodium 137  135 - 145 mEq/L   Potassium 3.1 (*) 3.5 - 5.1 mEq/L   Chloride 104  96 - 112 mEq/L   CO2 24  19 - 32 mEq/L   Glucose, Bld 125 (*) 70 - 99 mg/dL   BUN 5 (*) 6 - 23 mg/dL   Creatinine, Ser 5.78  0.50 - 1.35 mg/dL   Calcium 8.4  8.4 - 46.9 mg/dL   GFR calc non Af Amer >90  >90 mL/min   GFR calc Af Amer >90  >90 mL/min  PROTIME-INR      Result Value Range   Prothrombin Time 23.0 (*) 11.6 - 15.2 seconds   INR 2.11 (*) 0.00 - 1.49  CBC      Result Value Range   WBC 11.9 (*) 4.0 - 10.5 K/uL   RBC 3.68 (*) 4.22 - 5.81 MIL/uL   Hemoglobin 11.2 (*) 13.0 - 17.0 g/dL   HCT 62.9 (*) 52.8 - 41.3 %   MCV 91.0  78.0 - 100.0 fL   MCH 30.4  26.0 - 34.0 pg   MCHC 33.4  30.0 - 36.0 g/dL   RDW 24.4  01.0 - 27.2 %   Platelets 606 (*) 150 - 400 K/uL  BASIC METABOLIC PANEL      Result Value Range   Sodium 137  135 - 145 mEq/L   Potassium 3.5  3.5 - 5.1 mEq/L   Chloride 102  96 - 112 mEq/L   CO2 25  19 - 32 mEq/L   Glucose, Bld 111 (*) 70 - 99 mg/dL   BUN 6  6 - 23 mg/dL   Creatinine, Ser 5.36  0.50 - 1.35 mg/dL   Calcium 8.3 (*) 8.4 - 10.5 mg/dL   GFR calc non Af Amer >90  >90 mL/min   GFR calc Af Amer >90  >90 mL/min  PROTIME-INR      Result Value Range   Prothrombin Time 23.3 (*) 11.6 - 15.2 seconds  INR 2.15 (*) 0.00 -  1.49  CBC      Result Value Range   WBC 16.6 (*) 4.0 - 10.5 K/uL   RBC 3.76 (*) 4.22 - 5.81 MIL/uL   Hemoglobin 11.5 (*) 13.0 - 17.0 g/dL   HCT 16.1 (*) 09.6 - 04.5 %   MCV 91.0  78.0 - 100.0 fL   MCH 30.6  26.0 - 34.0 pg   MCHC 33.6  30.0 - 36.0 g/dL   RDW 40.9  81.1 - 91.4 %   Platelets 682 (*) 150 - 400 K/uL  BASIC METABOLIC PANEL      Result Value Range   Sodium 135  135 - 145 mEq/L   Potassium 3.7  3.5 - 5.1 mEq/L   Chloride 100  96 - 112 mEq/L   CO2 25  19 - 32 mEq/L   Glucose, Bld 119 (*) 70 - 99 mg/dL   BUN 8  6 - 23 mg/dL   Creatinine, Ser 7.82  0.50 - 1.35 mg/dL   Calcium 8.7  8.4 - 95.6 mg/dL   GFR calc non Af Amer >90  >90 mL/min   GFR calc Af Amer >90  >90 mL/min  PROTIME-INR      Result Value Range   Prothrombin Time 24.3 (*) 11.6 - 15.2 seconds   INR 2.27 (*) 0.00 - 1.49  URINALYSIS, ROUTINE W REFLEX MICROSCOPIC      Result Value Range   Color, Urine YELLOW  YELLOW   APPearance CLEAR  CLEAR   Specific Gravity, Urine >1.046 (*) 1.005 - 1.030   pH 5.0  5.0 - 8.0   Glucose, UA NEGATIVE  NEGATIVE mg/dL   Hgb urine dipstick TRACE (*) NEGATIVE   Bilirubin Urine NEGATIVE  NEGATIVE   Ketones, ur NEGATIVE  NEGATIVE mg/dL   Protein, ur 30 (*) NEGATIVE mg/dL   Urobilinogen, UA 0.2  0.0 - 1.0 mg/dL   Nitrite NEGATIVE  NEGATIVE   Leukocytes, UA NEGATIVE  NEGATIVE  URINE MICROSCOPIC-ADD ON      Result Value Range   Squamous Epithelial / LPF RARE  RARE   WBC, UA 0-2  <3 WBC/hpf   RBC / HPF 0-2  <3 RBC/hpf   Casts HYALINE CASTS (*) NEGATIVE  TYPE AND SCREEN      Result Value Range   ABO/RH(D) O POS     Antibody Screen NEG     Sample Expiration 04/21/2013     Unit Number [id number]     Blood Component Type RED CELLS,LR     Unit division 00     Status of Unit REL FROM [facility]     Transfusion Status OK TO TRANSFUSE     Crossmatch Result Compatible     Unit Number [id number]     Blood Component Type RED CELLS,LR     Unit division 00     Status of Unit REL  FROM [facility]     Transfusion Status OK TO TRANSFUSE     Crossmatch Result Compatible     A/P: Pt with hx of Chron's colitis, s/p ileostomy takedown with ileorectal anastomosis 04/18/13; now with leukocytosis and recent CT demonstrating pelvic fluid collections/? Abscesses, largest in rectovesical pouch. Plan is for CT guided aspiration/possible drainage of largest fluid collection on 9/25 if accessible window present. Pt on coumadin secondary to hx antithrombin III deficiency with PT 24.3/INR 2.27. CCS to hold coumadin and give vit k. Will recheck labs in am and proceed if within safe limit (preferably INR <1.7).  Details/risks of procedure d/w pt with his understanding and consent.

## 2013-05-01 ENCOUNTER — Encounter (HOSPITAL_COMMUNITY): Payer: Self-pay | Admitting: Radiology

## 2013-05-01 ENCOUNTER — Inpatient Hospital Stay (HOSPITAL_COMMUNITY): Payer: No Typology Code available for payment source

## 2013-05-01 LAB — BASIC METABOLIC PANEL
BUN: 8 mg/dL (ref 6–23)
Chloride: 100 mEq/L (ref 96–112)
Creatinine, Ser: 0.72 mg/dL (ref 0.50–1.35)
GFR calc Af Amer: >90 mL/min (ref >90)
GFR calc non Af Amer: >90 mL/min (ref >90)

## 2013-05-01 LAB — PROTIME-INR: Prothrombin Time: 17.5 seconds — ABNORMAL HIGH (ref 11.6–15.2)

## 2013-05-01 LAB — CBC
HCT: 35 % — ABNORMAL LOW (ref 39.0–52.0)
Hemoglobin: 11.6 g/dL — ABNORMAL LOW (ref 13.0–17.0)
MCHC: 33.1 g/dL (ref 30.0–36.0)
MCV: 90.9 fL (ref 78.0–100.0)
RDW: 14.9 % (ref 11.5–15.5)
WBC: 12.4 10*3/uL — ABNORMAL HIGH (ref 4.0–10.5)

## 2013-05-01 MED ORDER — FENTANYL CITRATE 0.05 MG/ML IJ SOLN
INTRAMUSCULAR | Status: AC
  Filled 2013-05-01: qty 6

## 2013-05-01 MED ORDER — ENOXAPARIN SODIUM 100 MG/ML ~~LOC~~ SOLN
90.0000 mg | Freq: Once | SUBCUTANEOUS | Status: AC
  Administered 2013-05-01: 90 mg via SUBCUTANEOUS
  Filled 2013-05-01: qty 1

## 2013-05-01 NOTE — Progress Notes (Signed)
NUTRITION FOLLOW UP  Intervention:   - Diet advancement per MD - Recommend nutrition support if pt found to have abscess as pt with increased nutrient needs with wound VAC and needs nutrition to promote would healing - Will continue to monitor   Nutrition Dx:   Inadequate oral intake related to diet order as evidenced by clear liquid diet - ongoing, now as evidenced by NPO   Goal:   Advance diet as tolerated to low fiber diet - not met   Monitor:   Weights, labs, diet advancement  Assessment:   Noted CT scan showed an intraabdominal fluid collection yesterday with probable abscess per MD notes. Pt NPO but reports when he was on a regular diet earlier in the week he was eating well and drinking 100% of [place]. Noted pt with slightly low potassium.   Height: Ht Readings from Last 1 Encounters:  04/18/13 5\' 9"  (1.753 m)    Weight Status:   Wt Readings from Last 1 Encounters:  04/18/13 208 lb 7.5 oz (94.56 kg)    Re-estimated needs:  Kcal: 1800-2000  Protein: 110-120g  Fluid: 1.8-2L/day   Skin: Wound VAC on abdomen   Diet Order: NPO   Intake/Output Summary (Last 24 hours) at 05/01/13 1420 Last data filed at 05/01/13 1400  Gross per 24 hour  Intake   2350 ml  Output    352 ml  Net   1998 ml    Last BM: 9/25   Labs:   Recent Labs Lab 04/29/13 0455 04/30/13 0500 05/01/13 0515  NA 137 135 137  K 3.5 3.7 3.3*  CL 102 100 100  CO2 25 25 25   BUN 6 8 8   CREATININE 0.69 0.78 0.72  CALCIUM 8.3* 8.7 8.8  GLUCOSE 111* 119* 114*    CBG (last 3)  No results found for this basename: GLUCAP,  in the last 72 hours  Scheduled Meds: . antiseptic oral rinse  15 mL Mouth Rinse q12n4p  . chlorhexidine  15 mL Mouth Rinse BID  . feeding supplement  1 Container Oral TID BM  . piperacillin-tazobactam (ZOSYN)  IV  3.375 g Intravenous Q8H  . Warfarin - Pharmacist Dosing Inpatient   Does not apply q1800    Continuous Infusions: . dextrose 5 % and 0.9 % NaCl  with KCl 20 mEq/L 100 mL/hr at 04/30/13 [address] [place], [place], [place] [phone] Pager [phone] After Hours Pager

## 2013-05-01 NOTE — Progress Notes (Signed)
Orders received from MD Rosenbower that patient can have clear liquid diet up until 2 am 05/02/2013 and NPO after that time Stanford Breed RN 05-01-2013 17:40pm

## 2013-05-01 NOTE — Progress Notes (Signed)
IR MD has reviewed repeat CT images today. Abscess/fluid collection persists, no evidence of contrast in fluid. Collection is amenable to perc aspiration/drainage. Unfortunatety, due to emergent cases, will have to delay procedure until early am 9/26.

## 2013-05-01 NOTE — Progress Notes (Signed)
Patient ID: Rick Edwards, male   DOB: Dec 13, 1955, 57 y.o.   MRN: [id number] 13 Days Post-Op  Subjective: No major complaints this morning. Resting comfortably. Still some occasional left mid abdominal pain but not severe. No nausea or vomiting. Had a bowel movement this morning.  Objective: Vital signs in last 24 hours: Temp:  [98.2 F (36.8 C)-99.4 F (37.4 C)] 98.2 F (36.8 C) (09/25 0602) Pulse Rate:  [77-97] 77 (09/25 0602) Resp:  [18] 18 (09/25 0602) BP: (142-170)/(85-96) 142/90 mmHg (09/25 0602) SpO2:  [95 %-97 %] 97 % (09/25 0602) Last BM Date: 04/30/13  Intake/Output from previous day: 09/24 0701 - 09/25 0700 In: 2111.7 [I.V.:1911.7; IV Piggyback:200] Out: 552 [Urine:551; Stool:1] Intake/Output this shift:    General appearance: alert, cooperative and no distress GI: remains moderately distended but not tight. No apparent tenderness.  Bowel sounds are present and somewhat high pitched. Incision/Wound: back in place without unusual drainage. Right lower quadrant incision clean and dry  Lab Results:   Recent Labs  04/30/13 0500 05/01/13 0515  WBC 16.6* 12.4*  HGB 11.5* 11.6*  HCT 34.2* 35.0*  PLT 682* 788*   BMET  Recent Labs  04/30/13 0500 05/01/13 0515  NA 135 137  K 3.7 3.3*  CL 100 100  CO2 25 25  GLUCOSE 119* 114*  BUN 8 8  CREATININE 0.78 0.72  CALCIUM 8.7 8.8     Studies/Results: Dg Chest 2 View  04/30/2013   CLINICAL DATA:  57 year old male with postoperative leukocytosis. Status post ileostomy takedown.  EXAM: CHEST  2 VIEW  COMPARISON:  12/29/2012 and earlier.  FINDINGS: Semi upright AP and lateral views. Lower lung volumes. Stable cardiac size and mediastinal contours. Visualized tracheal air column is within normal limits. No pneumothorax or pneumoperitoneum identified. Patchy bibasilar opacity most resembles atelectasis. No pleural effusion or pulmonary edema. No other confluent opacity. Air-fluid level in the stomach with mildly  prominent gas-filled bowel loops in the visible upper abdomen.  IMPRESSION: Lower lung volumes with bibasilar atelectasis.   Electronically Signed   By: Augusto Gamble M.D.   On: 04/30/2013 12:55   Ct Abdomen Pelvis W Contrast  04/30/2013   CLINICAL DATA:  Status post ileostomy take-down. Leukocytosis. Postoperative day 11  EXAM: CT ABDOMEN AND PELVIS WITH CONTRAST  TECHNIQUE: Multidetector CT imaging of the abdomen and pelvis was performed using the standard protocol following bolus administration of intravenous contrast.  CONTRAST:  OMNIPAQUE IOHEXOL 300 MG/ML  SOLN  COMPARISON:  For 12th 13  FINDINGS: Compressive atelectasis is seen in the posterior lung bases bilaterally.  The liver is enlarged, measuring 20.4 cm in craniocaudal length. Diffuse fatty infiltration of the liver parenchyma is evident. Spleen is normal in appearance. The stomach, duodenum, pancreas, gallbladder, and adrenal glands are unremarkable.  Multiple well-defined low-density lesions of varying sizes are seen in both kidneys measuring from several mm up to a maximum size of 19 mm in the upper pole of the left kidney.  There is no abdominal aortic aneurysm. No free fluid or lymphadenopathy in the abdomen.  Small bowel loops in the abdomen are dilated and fluid-filled, measuring up to 5.3 cm in diameter. There is an area of loculated fluid in the left mesentery measuring 3.6 x 8.5 cm. This does not have a well organized or enhancing rim to suggest an overt abscess at this time.  The oral contrast material opacifies nondilated duodenum and proximal jejunum. There is a segment of more distal jejunum which tracks down  into the a central pelvis, just cranial to the anastomotic suture line. This jejunum shows mild wall thickening and is not dilated. However oral contrast material migrates distal to this area into small bowel loops that become distended, but the contrast material has not yet reached the most distended small bowel loops in the  abdomen and pelvis.  There is a tiny amount of gas in the midline incision which is not completely unexpected 11 days after surgery and the patient appears to have a wound manager in place.  A 1.8 x 5.6 cm fluid collection is seen in the right anterolateral abdominal wall, just deep to the staple line, likely represents the site of stoma takedown. This appearance is not unexpected only 11 days from surgery.  Imaging through the pelvis shows no substantial intraperitoneal free fluid. The rectum is fluid-filled. The ileocolic anastomosis is identified and there is a crescent shaped fluid collection tracking from the anastomotic suture line down to the right under the rectum. Since the blind end of the ileum is evident, I do not think that this crescent-shaped, somewhat tubular fluid collection represents bowel. The largest portion of this fluid collection measures approximately 6 x 3.8 cm and is located in the rectovesical pouch. This collection demonstrates an organized rim which appears to show some enhancement. The anastomosis appears to be patent although the rectum just distal to the anastomotic suture line appears slightly narrowed with mild circumferential wall thickening.  Bone windows reveal no worrisome lytic or sclerotic osseous lesions.  IMPRESSION: Dilated central small bowel (likely representing distal jejunum and proximal ileum) measures up to 5 cm in diameter and contains layering fluid with associated air-fluid levels. No associated small bowel wall thickening is evident. The small bowel proximal and distal to the most dilated segments are nondilated. These imaging features could be related to an inflammatory ileus. Mechanical obstruction cannot be completely excluded although no overt transition zone is identified.  There is a loculated left mesenteric fluid collection which tracks from the region of the anastomosis up in the left mesentery along the peritoneal sidewall.  There is a loculated  crescent shaped, tubular fluid collection with an organized rim which tracks from the anastomosis down the right side of the pelvic sidewall and then under the rectum into the region of the rectovesical pouch.  The fluid collection in the rectovesical pouch appears to be the more organized of the 2 dominant fluid collections (the other one being the left mesenteric/peritoneal cavity collection). Given the apparent organized rim of the pelvic collection, evolving abscess would be a consideration in light of the patient's history of leukocytosis and bowel distention.  I personally discussed these findings by telephone with Dr. Johna Sheriff at approximately 1430 hours on 04/30/2013 .   Electronically Signed   By: Kennith Center M.D.   On: 04/30/2013 14:38    Anti-infectives: Anti-infectives   Start     Dose/Rate Route Frequency Ordered Stop   04/30/13 0900  piperacillin-tazobactam (ZOSYN) IVPB 3.375 g     3.375 g 12.5 mL/hr over 240 Minutes Intravenous Every 8 hours 04/30/13 0811     04/18/13 0747  cefOXitin (MEFOXIN) 2 g in dextrose 5 % 50 mL IVPB     2 g 100 mL/hr over 30 Minutes Intravenous On call to O.R. 04/18/13 0747 04/18/13 1144      Assessment/Plan: s/p Procedure(s): ILEOSTOMY TAKEDOWN CT scan as above has shown pelvic and left abdominal fluid collections associated with the anastomosis, probable abscess. Cannot rule out contained  leak. Patient is stable and white count improved on IV Zosyn. 4 repeat CT and possible pelvic fluid aspiration or drainage today. Contrast should have reached the rectum today which will help [place] evaluate the anastomosis. Continue IV antibiotics. Patient is n.p.o. Except for meds and sips.   LOS: 13 days    Willis Kuipers T 05/01/2013

## 2013-05-02 ENCOUNTER — Inpatient Hospital Stay (HOSPITAL_COMMUNITY): Payer: No Typology Code available for payment source

## 2013-05-02 ENCOUNTER — Telehealth (INDEPENDENT_AMBULATORY_CARE_PROVIDER_SITE_OTHER): Payer: Self-pay | Admitting: General Surgery

## 2013-05-02 LAB — BASIC METABOLIC PANEL
BUN: 6 mg/dL (ref 6–23)
CO2: 27 mEq/L (ref 19–32)
Calcium: 8.6 mg/dL (ref 8.4–10.5)
Chloride: 102 mEq/L (ref 96–112)
Creatinine, Ser: 0.74 mg/dL (ref 0.50–1.35)
Potassium: 3.3 mEq/L — ABNORMAL LOW (ref 3.5–5.1)

## 2013-05-02 LAB — CBC
MCH: 29.6 pg (ref 26.0–34.0)
MCV: 91 fL (ref 78.0–100.0)
Platelets: 891 10*3/uL — ABNORMAL HIGH (ref 150–400)
RBC: 3.79 MIL/uL — ABNORMAL LOW (ref 4.22–5.81)
WBC: 10.1 10*3/uL (ref 4.0–10.5)

## 2013-05-02 LAB — PROTIME-INR: Prothrombin Time: 15.4 seconds — ABNORMAL HIGH (ref 11.6–15.2)

## 2013-05-02 MED ORDER — HYDROMORPHONE HCL PF 2 MG/ML IJ SOLN
INTRAMUSCULAR | Status: AC
  Administered 2013-05-02: 2 mg via INTRAVENOUS
  Filled 2013-05-02: qty 1

## 2013-05-02 MED ORDER — ENOXAPARIN SODIUM 100 MG/ML ~~LOC~~ SOLN
90.0000 mg | SUBCUTANEOUS | Status: AC
  Administered 2013-05-02 (×2): 90 mg via SUBCUTANEOUS
  Filled 2013-05-02 (×2): qty 1

## 2013-05-02 MED ORDER — WARFARIN SODIUM 10 MG PO TABS
10.0000 mg | ORAL_TABLET | Freq: Once | ORAL | Status: AC
  Administered 2013-05-02: 10 mg via ORAL
  Filled 2013-05-02: qty 1

## 2013-05-02 MED ORDER — ENOXAPARIN SODIUM 100 MG/ML ~~LOC~~ SOLN
90.0000 mg | Freq: Two times a day (BID) | SUBCUTANEOUS | Status: DC
  Administered 2013-05-03 – 2013-05-07 (×10): 90 mg via SUBCUTANEOUS
  Filled 2013-05-02 (×12): qty 1

## 2013-05-02 MED ORDER — FENTANYL CITRATE 0.05 MG/ML IJ SOLN
INTRAMUSCULAR | Status: AC
  Filled 2013-05-02: qty 6

## 2013-05-02 MED ORDER — MIDAZOLAM HCL 2 MG/2ML IJ SOLN
INTRAMUSCULAR | Status: AC
  Filled 2013-05-02: qty 6

## 2013-05-02 NOTE — Telephone Encounter (Signed)
Sister of in-patient called to ask if Dr. Johna Sheriff would please call pt's father, Rick Edwards, to discuss plan for brother.  Apparently he is in much pain and the scan today (per sister's understanding) shows no access to area of problem.  The family is understandably worried and concerned.  Sister understands that Dr. Johna Sheriff is seeing pts in the clinic this afternoon, so may be late before he can call back.  Rick Edwards can be reached at (H) [phone] or (C) [phone].

## 2013-05-02 NOTE — Progress Notes (Signed)
Patient ID: Rick Edwards, male   DOB: 1955/11/10, 57 y.o.   MRN: [id number] 14 Days Post-Op  Subjective: Feels okay this evening. Still some left lower quadrant abdominal pain with bowel movements but he thinks that this is gradually better. No nausea or vomiting.  Objective: Vital signs in last 24 hours: Temp:  [98.2 F (36.8 C)-98.3 F (36.8 C)] 98.2 F (36.8 C) (09/26 1400) Pulse Rate:  [89-99] 89 (09/26 1400) Resp:  [18] 18 (09/26 1400) BP: (150-163)/(98-102) 150/98 mmHg (09/26 1400) SpO2:  [92 %-96 %] 92 % (09/26 1400) Last BM Date: 05/01/13  Intake/Output from previous day: 09/25 0701 - 09/26 0700 In: 2500 [I.V.:2500] Out: 1 [Urine:1] Intake/Output this shift: Total I/O In: 920 [P.O.:120; I.V.:800] Out: 100 [Urine:100]  General appearance: alert, cooperative and no distress GI: abnormal findings:  mild distention which is improved  compared to yesterday. Nontender. Incision/Wound: VAC in place without evidence of infection  Lab Results:   Recent Labs  05/01/13 0515 05/02/13 0524  WBC 12.4* 10.1  HGB 11.6* 11.2*  HCT 35.0* 34.5*  PLT 788* 891*   BMET  Recent Labs  05/01/13 0515 05/02/13 0524  NA 137 139  K 3.3* 3.3*  CL 100 102  CO2 25 27  GLUCOSE 114* 107*  BUN 8 6  CREATININE 0.72 0.74  CALCIUM 8.8 8.6     Studies/Results: Ct Pelvis Wo Contrast  05/02/2013   *RADIOLOGY REPORT*  Clinical Data: History of Crohn disease, post colectomy with ileorectal anastomosis and recent ileostomy takedown.  Prolonged recovery with subsequent abdominal CT demonstrating an indeterminate pelvic fluid collection.  Evaluate pelvic fluid collection for potential percutaneous aspiration/drainage.  CT PELVIS WITHOUT CONTRAST  Technique:  Multidetector CT imaging of the pelvis was performed following the standard protocol without intravenous contrast.  Comparison: CT abdomen pelvis - 04/30/2013; pelvic CT - 05/01/2013  Findings:  Noncontrast abdominal CT was performed  with the patient positioned left lateral decubitus on the CT gantry.  Noncontrast imaging demonstrates grossly unchanged approximately 3.6 x 6.0 cm fluid collection within the pelvis anterior to the rectum and posterior to the bladder.  Again, there is no definitive extravasation of ingested enteric contrast into this pelvic fluid collection.  As no adequate percutaneous window was seen to this small pelvic fluid collection, no procedure was performed.  Grossly unchanged appearance of partially loculated fluid collection within the left mid hemiabdomen (image 5, series 2), incompletely imaged.  Midline wound vac.  Skin staples within the right lower abdominal quadrant at location of ileostomy takedown.  No acute or aggressive osseous abnormalities.  IMPRESSION: 1.  Unchanged size and appearance of approximately 6.0 x 3.6 cm pelvic fluid collection.  Again, there is no extravasation of ingested enteric contrast into this pelvic fluid collection.  Given lack of adequate percutaneous window, attempted aspiration/drainage of the fluid collection was not performed. 2.  Grossly unchanged loculated fluid within the left mid hemiabdomen, incompletely imaged.   Original Report Authenticated By: Tacey Ruiz, MD   Ct Pelvis Wo Contrast  05/01/2013   CLINICAL DATA:  Pelvic abscess. FOLLOW UP pelvic fluid collection.  EXAM: CT PELVIS WITHOUT CONTRAST  TECHNIQUE: Multidetector CT imaging of the pelvis was performed following the standard protocol without intravenous contrast.  COMPARISON:  04/30/2013.  FINDINGS: Small bowel remains dilated. Contrast has progressed distally into the rectum. Anastomotic staple line is present in the anatomic pelvis. Immediately inferior to this, there is a oblong fluid collection that today measures 64 mm x 33 mm  on axial imaging in the rectovesical pouch. No interval change compared to yesterday's examination. Urinary bladder and prostate gland appear within normal limits with excreted  contrast opacifying the bladder. Bony pelvis appears within normal limits. No free air is identified. Midline abdominal wound is present, likely healing by secondary intention. Right lower quadrant staples are present compatible with ileostomy air colostomy takedown. The left mesenteric fluid collection remains present, without interval change compared to yesterday.  IMPRESSION: Unchanged pelvic fluid collection which may represent hematoma, seroma or abscess. Contrast is present within the rectum and there is no contrast within the fluid collection to suggest leak.   Electronically Signed   By: Andreas Newport M.D.   On: 05/01/2013 13:55    Anti-infectives: Anti-infectives   Start     Dose/Rate Route Frequency Ordered Stop   04/30/13 0900  piperacillin-tazobactam (ZOSYN) IVPB 3.375 g     3.375 g 12.5 mL/hr over 240 Minutes Intravenous Every 8 hours 04/30/13 0811     04/18/13 0747  cefOXitin (MEFOXIN) 2 g in dextrose 5 % 50 mL IVPB     2 g 100 mL/hr over 30 Minutes Intravenous On call to O.R. 04/18/13 0747 04/18/13 1144      Assessment/Plan: s/p Procedure(s): ILEOSTOMY TAKEDOWN Abdominal fluid collections, question abscess, not accessible for percutaneous drainage. No evidence of anastomotic leak. These fluid collections are small and his white count has quickly normalized on antibiotics.It is likely that drainage will not be necessary. Continue IV antibiotics. Full liquid diet as there is still some distention although his bowels are moving regularly and he has no nausea. Anti-thrombin 3 deficiency. No evidence of thrombosis and the pharmacy is managing Lovenox/warfarin.    LOS: 14 days    Greysin Medlen T 05/02/2013

## 2013-05-02 NOTE — Progress Notes (Signed)
ANTICOAGULATION CONSULT NOTE  Pharmacy Consult for warfarin Indication:  AT III deficiency, hx arterial thromboembolism  Allergies  Allergen Reactions  . Mesalamine     REACTION: Rash   Patient Measurements: Height: 5\' 9"  (175.3 cm) Weight: 208 lb 7.5 oz (94.56 kg) IBW/kg (Calculated) : 70.7  Vital Signs: Temp: 98.2 F (36.8 C) (09/26 0535) Temp src: Oral (09/26 0535) BP: 163/99 mmHg (09/26 0535) Pulse Rate: 91 (09/26 0535)  Labs:  Recent Labs  04/30/13 0500 05/01/13 0515 05/02/13 0524  HGB 11.5* 11.6* 11.2*  HCT 34.2* 35.0* 34.5*  PLT 682* 788* 891*  LABPROT 24.3* 17.5* 15.4*  INR 2.27* 1.48 1.25  CREATININE 0.78 0.72 0.74   Estimated Creatinine Clearance: 119.9 ml/min (by C-G formula based on Cr of 0.74).  Assessment: 57 yo male s/p ileostomy takedown with ileorectal anastomosis, was on warfarin PTA for ATIII deficiency with arterial thrombi.  Warfarin was stopped 5 days prior to surgery and Lovenox 135mg  SQ daily administered 9/9 thru 9/11 AM, then surgery on 9/12. Warfarin was restarted 9/12 with pharmacy dosing.  Received low-dose SQ heparin then full-dose LMWH until INR therapeutic. Warfarin stopped 9/24 and received vitamin K 5mg  IV for reversal for procedure.  PTA warfarin dose from 9/5 anticoag visit: 7.5mg  daily except 10mg  Wed. PTA warfarin dose reported by patient: 7.5mg  daily except 10mg  Wed/Sat.   INR is sub-therapeutic at 1.25.    Inpatient warfarin doses so far (9/12-9/23):  5mg  x 3 days, 7.5mg , 10mg  x 2 days, 5 mg x 3 days, 6mg , 5mg  x 2 days  Hgb 11.2,  No reports of bleeding.  Regular diet resumed 9/26 (previously NPO for procedure)    Goal of Therapy:  INR 2-3   Plan:   Lovenox 90mg  q12h until INR is therapeutic  Warfarin 10mg  today  Daily PT/INR   Avagail Whittlesey PharmD Candidate 11:56 AM 05/02/2013

## 2013-05-02 NOTE — Progress Notes (Signed)
Agree with above information  CT results discussed with Dr. Johna Sheriff, he is aware no drain placed. Will resumed Warfarin, add Lovenox till therapeutic INR.  Otho Bellows PharmD Pager [phone] 05/02/2013, 12:09 PM

## 2013-05-03 LAB — CBC
HCT: 32.3 % — ABNORMAL LOW (ref 39.0–52.0)
Hemoglobin: 10.2 g/dL — ABNORMAL LOW (ref 13.0–17.0)
MCH: 29 pg (ref 26.0–34.0)
MCHC: 31.6 g/dL (ref 30.0–36.0)
MCV: 91.8 fL (ref 78.0–100.0)
Platelets: 940 10*3/uL (ref 150–400)
WBC: 11.2 10*3/uL — ABNORMAL HIGH (ref 4.0–10.5)

## 2013-05-03 LAB — PROTIME-INR: Prothrombin Time: 16.6 seconds — ABNORMAL HIGH (ref 11.6–15.2)

## 2013-05-03 LAB — BASIC METABOLIC PANEL
BUN: 5 mg/dL — ABNORMAL LOW (ref 6–23)
Calcium: 8.6 mg/dL (ref 8.4–10.5)
Creatinine, Ser: 0.88 mg/dL (ref 0.50–1.35)
GFR calc non Af Amer: >90 mL/min (ref >90)
Glucose, Bld: 115 mg/dL — ABNORMAL HIGH (ref 70–99)
Sodium: 138 mEq/L (ref 135–145)

## 2013-05-03 MED ORDER — WARFARIN SODIUM 7.5 MG PO TABS
7.5000 mg | ORAL_TABLET | Freq: Once | ORAL | Status: AC
  Administered 2013-05-03: 7.5 mg via ORAL
  Filled 2013-05-03: qty 1

## 2013-05-03 NOTE — Progress Notes (Signed)
ANTICOAGULATION CONSULT NOTE  Pharmacy Consult for Lovenox and warfarin Indication:  AT III deficiency, hx arterial thromboembolism  Allergies  Allergen Reactions  . Mesalamine     REACTION: Rash   Patient Measurements: Height: 5\' 9"  (175.3 cm) Weight: 208 lb 7.5 oz (94.56 kg) IBW/kg (Calculated) : 70.7  Vital Signs: Temp: 98.6 F (37 C) (09/27 0557) Temp src: Oral (09/27 0557) BP: 155/82 mmHg (09/27 0557) Pulse Rate: 88 (09/27 0557)  Labs:  Recent Labs  05/01/13 0515 05/02/13 0524 05/03/13 0556  HGB 11.6* 11.2* 10.2*  HCT 35.0* 34.5* 32.3*  PLT 788* 891* 940*  LABPROT 17.5* 15.4* 16.6*  INR 1.48 1.25 1.38  CREATININE 0.72 0.74 0.88   Estimated Creatinine Clearance: 109 ml/min (by C-G formula based on Cr of 0.88).  Assessment: 57 yo male s/p ileostomy takedown with ileorectal anastomosis, was on warfarin PTA for ATIII deficiency with arterial thrombi.  Warfarin was stopped 5 days prior to surgery and Lovenox 135mg  SQ daily administered 9/9 thru 9/11 AM, then surgery on 9/12. Warfarin was restarted 9/12 with pharmacy dosing.  Received low-dose SQ heparin then full-dose LMWH until INR therapeutic. Warfarin stopped 9/24 and received vitamin K 5mg  IV for reversal for procedure.  PTA warfarin dose from 9/5 anticoag visit: 7.5mg  daily except 10mg  Wed. PTA warfarin dose reported by patient: 7.5mg  daily except 10mg  Wed/Sat.   INR is sub-therapeutic but rising.    Hgb 10.2,  No reports of bleeding.  Regular diet resumed 9/26 (previously NPO for procedure)    Goal of Therapy:  INR 2-3   Plan:   Cont Lovenox 90mg  q12h until INR is therapeutic.  Give warfarin 7.5mg  today.  Daily PT/INR.  Charolotte Eke, PharmD, pager [phone]. 05/03/2013,8:31 AM.

## 2013-05-03 NOTE — Progress Notes (Signed)
Patient ID: Rick Edwards, male   DOB: 09/10/1955, 57 y.o.   MRN: [id number]  General Surgery - [facility] - Progress Note  POD# 15  Subjective: Patient up to bathroom.  Soft BM this AM.  Tolerating FL diet.  Minimal ambulation.  VAC dressing functioning normally.  Objective: Vital signs in last 24 hours: Temp:  [98 F (36.7 C)-98.6 F (37 C)] 98.6 F (37 C) (09/27 0557) Pulse Rate:  [88-93] 88 (09/27 0557) Resp:  [18-20] 18 (09/27 0557) BP: (150-165)/(82-98) 155/82 mmHg (09/27 0557) SpO2:  [92 %-99 %] 99 % (09/27 0557) Last BM Date: 05/02/13  Intake/Output from previous day: 09/26 0701 - 09/27 0700 In: 1881.3 [P.O.:120; I.V.:1461.3; IV Piggyback:300] Out: 750 [Urine:600; Drains:150]  Exam: HEENT - clear, not icteric Neck - soft Chest - clear bilaterally Cor - RRR, no murmur Abd - soft, mild distension; BS present; midline VAC dressing intact with serous drainage Ext - no significant edema Neuro - grossly intact, no focal deficits  Lab Results:   Recent Labs  05/02/13 0524 05/03/13 0556  WBC 10.1 11.2*  HGB 11.2* 10.2*  HCT 34.5* 32.3*  PLT 891* 940*     Recent Labs  05/02/13 0524 05/03/13 0556  NA 139 138  K 3.3* 3.1*  CL 102 101  CO2 27 28  GLUCOSE 107* 115*  BUN 6 5*  CREATININE 0.74 0.88  CALCIUM 8.6 8.6    Studies/Results: Ct Pelvis Wo Contrast  05/02/2013   *RADIOLOGY REPORT*  Clinical Data: History of Crohn disease, post colectomy with ileorectal anastomosis and recent ileostomy takedown.  Prolonged recovery with subsequent abdominal CT demonstrating an indeterminate pelvic fluid collection.  Evaluate pelvic fluid collection for potential percutaneous aspiration/drainage.  CT PELVIS WITHOUT CONTRAST  Technique:  Multidetector CT imaging of the pelvis was performed following the standard protocol without intravenous contrast.  Comparison: CT abdomen pelvis - 04/30/2013; pelvic CT - 05/01/2013  Findings:  Noncontrast abdominal  CT was performed with the patient positioned left lateral decubitus on the CT gantry.  Noncontrast imaging demonstrates grossly unchanged approximately 3.6 x 6.0 cm fluid collection within the pelvis anterior to the rectum and posterior to the bladder.  Again, there is no definitive extravasation of ingested enteric contrast into this pelvic fluid collection.  As no adequate percutaneous window was seen to this small pelvic fluid collection, no procedure was performed.  Grossly unchanged appearance of partially loculated fluid collection within the left mid hemiabdomen (image 5, series 2), incompletely imaged.  Midline wound vac.  Skin staples within the right lower abdominal quadrant at location of ileostomy takedown.  No acute or aggressive osseous abnormalities.  IMPRESSION: 1.  Unchanged size and appearance of approximately 6.0 x 3.6 cm pelvic fluid collection.  Again, there is no extravasation of ingested enteric contrast into this pelvic fluid collection.  Given lack of adequate percutaneous window, attempted aspiration/drainage of the fluid collection was not performed. 2.  Grossly unchanged loculated fluid within the left mid hemiabdomen, incompletely imaged.   Original Report Authenticated By: Tacey Ruiz, MD   Ct Pelvis Wo Contrast  05/01/2013   CLINICAL DATA:  Pelvic abscess. FOLLOW UP pelvic fluid collection.  EXAM: CT PELVIS WITHOUT CONTRAST  TECHNIQUE: Multidetector CT imaging of the pelvis was performed following the standard protocol without intravenous contrast.  COMPARISON:  04/30/2013.  FINDINGS: Small bowel remains dilated. Contrast has progressed distally into the rectum. Anastomotic staple line is present in the anatomic pelvis. Immediately inferior to this, there is  a oblong fluid collection that today measures 64 mm x 33 mm on axial imaging in the rectovesical pouch. No interval change compared to yesterday's examination. Urinary bladder and prostate gland appear within normal limits with  excreted contrast opacifying the bladder. Bony pelvis appears within normal limits. No free air is identified. Midline abdominal wound is present, likely healing by secondary intention. Right lower quadrant staples are present compatible with ileostomy air colostomy takedown. The left mesenteric fluid collection remains present, without interval change compared to yesterday.  IMPRESSION: Unchanged pelvic fluid collection which may represent hematoma, seroma or abscess. Contrast is present within the rectum and there is no contrast within the fluid collection to suggest leak.   Electronically Signed   By: Andreas Newport M.D.   On: 05/01/2013 13:55    Assessment / Plan: 1.  Status post ileostomy closure  IV Zosyn for intra-abd fluid collections  Full liquid diet  VAC dressing to abdominal wound  Encouraged ambulation, IS use, OOB  Velora Heckler, MD, [facility] Office: [phone]  05/03/2013

## 2013-05-03 NOTE — Progress Notes (Signed)
CRITICAL VALUE ALERT  Critical value received:  Platelet count [phone]  Date of notification:  05/03/2013  Time of notification:  0740  Critical value read back: yes  Nurse who received alert:  Alda Berthold, RN  MD notified (1st page):  Dr. Gerrit Friends  Time of first page:  0810  MD notified (2nd page):  Time of second page:  Responding MD:  Dr. Gerrit Friends on am rounds  Time MD responded:  0810  No new orders received at this time.

## 2013-05-04 LAB — CBC
HCT: 32.2 % — ABNORMAL LOW (ref 39.0–52.0)
Hemoglobin: 10.8 g/dL — ABNORMAL LOW (ref 13.0–17.0)
MCH: 30.5 pg (ref 26.0–34.0)
MCHC: 33.5 g/dL (ref 30.0–36.0)
MCV: 91 fL (ref 78.0–100.0)
RBC: 3.54 MIL/uL — ABNORMAL LOW (ref 4.22–5.81)
WBC: 10.9 10*3/uL — ABNORMAL HIGH (ref 4.0–10.5)

## 2013-05-04 LAB — BASIC METABOLIC PANEL
BUN: 4 mg/dL — ABNORMAL LOW (ref 6–23)
CO2: 27 mEq/L (ref 19–32)
Calcium: 8.5 mg/dL (ref 8.4–10.5)
GFR calc non Af Amer: >90 mL/min (ref >90)
Glucose, Bld: 126 mg/dL — ABNORMAL HIGH (ref 70–99)
Potassium: 2.8 mEq/L — ABNORMAL LOW (ref 3.5–5.1)

## 2013-05-04 MED ORDER — WARFARIN SODIUM 7.5 MG PO TABS
7.5000 mg | ORAL_TABLET | Freq: Once | ORAL | Status: AC
  Administered 2013-05-04: 7.5 mg via ORAL
  Filled 2013-05-04: qty 1

## 2013-05-04 MED ORDER — POTASSIUM CHLORIDE 10 MEQ/100ML IV SOLN
10.0000 meq | INTRAVENOUS | Status: AC
  Administered 2013-05-04 (×4): 10 meq via INTRAVENOUS
  Filled 2013-05-04 (×4): qty 100

## 2013-05-04 NOTE — Progress Notes (Signed)
Patient ID: Rick Edwards, male   DOB: 1956-03-20, 57 y.o.   MRN: [id number]  General Surgery - [facility] - Progress Note  POD# 16  Subjective: Patient without complaints - some pain right abdominal wall at incision.  Has had 4-5 soft BM's overnight.  Tolerating full liquid diet.  Objective: Vital signs in last 24 hours: Temp:  [98.1 F (36.7 C)-99 F (37.2 C)] 98.1 F (36.7 C) (09/28 0500) Pulse Rate:  [88-93] 90 (09/28 0500) Resp:  [18-20] 18 (09/28 0500) BP: (161-174)/(88-106) 165/104 mmHg (09/28 0500) SpO2:  [95 %-97 %] 96 % (09/28 0500) Last BM Date: 05/03/13  Intake/Output from previous day: 09/27 0701 - 09/28 0700 In: 1214.3 [P.O.:840; I.V.:324.3; IV Piggyback:50] Out: 50 [Drains:50]  Exam: HEENT - clear, not icteric Neck - soft Chest - clear bilaterally Cor - RRR, no murmur Abd - softer, moderately distended; VAC intact; stoma site dry with staples; BS present Ext - no significant edema Neuro - grossly intact, no focal deficits  Lab Results:   Recent Labs  05/03/13 0556 05/04/13 0516  WBC 11.2* 10.9*  HGB 10.2* 10.8*  HCT 32.3* 32.2*  PLT 940* 931*     Recent Labs  05/03/13 0556 05/04/13 0516  NA 138 137  K 3.1* 2.8*  CL 101 99  CO2 28 27  GLUCOSE 115* 126*  BUN 5* 4*  CREATININE 0.88 0.70  CALCIUM 8.6 8.5    Studies/Results: No results found.  Assessment / Plan: 1.  Status post ileostomy closure  Continue full liquid diet  Encouraged ambulation  Encouraged use of po pain Rx  Continue VAC dressing 2.  Hypokalemia  4 runs of KCL today 3.  Anticoagulation  On Lovenox and Coumadin per pharmacy  Thrombocytosis stable  Velora Heckler, MD, [facility] Office: [phone]  05/04/2013

## 2013-05-04 NOTE — Progress Notes (Signed)
ANTICOAGULATION CONSULT NOTE - Follow up  Pharmacy Consult for Lovenox and warfarin Indication:  AT III deficiency, hx arterial thromboembolism  Allergies  Allergen Reactions  . Mesalamine     REACTION: Rash   Patient Measurements: Height: 5\' 9"  (175.3 cm) Weight: 208 lb 7.5 oz (94.56 kg) IBW/kg (Calculated) : 70.7  Vital Signs: Temp: 98.1 F (36.7 C) (09/28 0500) Temp src: Oral (09/28 0500) BP: 165/104 mmHg (09/28 0500) Pulse Rate: 90 (09/28 0500)  Labs:  Recent Labs  05/02/13 0524 05/03/13 0556 05/04/13 0516  HGB 11.2* 10.2* 10.8*  HCT 34.5* 32.3* 32.2*  PLT 891* 940* 931*  LABPROT 15.4* 16.6* 17.8*  INR 1.25 1.38 1.51*  CREATININE 0.74 0.88 0.70   Estimated Creatinine Clearance: 119.9 ml/min (by C-G formula based on Cr of 0.7).  Assessment: 57 yo male s/p ileostomy takedown with ileorectal anastomosis, was on warfarin PTA for ATIII deficiency with arterial thrombi.  Warfarin was stopped 5 days prior to surgery and Lovenox 135mg  SQ daily administered 9/9 thru 9/11 AM, then surgery on 9/12. Warfarin was restarted 9/12 with pharmacy dosing.  Received low-dose SQ heparin then full-dose LMWH until INR therapeutic. Warfarin stopped 9/24 and received vitamin K 5mg  IV for reversal for procedure.  PTA warfarin dose from 9/5 anticoag visit: 7.5mg  daily except 10mg  Wed. PTA warfarin dose reported by patient: 7.5mg  daily except 10mg  Wed/Sat.   INR is sub-therapeutic but rising after 10mg  and 7.5mg .    H/H stable. Platelets remain elevated. No reports of bleeding.  Tolerating FL diet.   Zosyn can increase sensitivity to warfarin.   Goal of Therapy:  INR 2-3   Plan:   Cont Lovenox 90mg  q12h until INR is therapeutic.  Repeat warfarin 7.5mg  today.  Daily PT/INR.  Charolotte Eke, PharmD, pager [phone]. 05/04/2013,8:52 AM.

## 2013-05-05 LAB — CBC
Hemoglobin: 10.7 g/dL — ABNORMAL LOW (ref 13.0–17.0)
MCH: 29.2 pg (ref 26.0–34.0)
MCHC: 32.4 g/dL (ref 30.0–36.0)
Platelets: 857 10*3/uL — ABNORMAL HIGH (ref 150–400)
RBC: 3.66 MIL/uL — ABNORMAL LOW (ref 4.22–5.81)
RDW: 14.4 % (ref 11.5–15.5)
WBC: 7.4 10*3/uL (ref 4.0–10.5)

## 2013-05-05 LAB — BASIC METABOLIC PANEL
BUN: 4 mg/dL — ABNORMAL LOW (ref 6–23)
CO2: 25 mEq/L (ref 19–32)
Calcium: 8.5 mg/dL (ref 8.4–10.5)
Chloride: 98 mEq/L (ref 96–112)
Creatinine, Ser: 0.69 mg/dL (ref 0.50–1.35)
GFR calc Af Amer: >90 mL/min (ref >90)
Sodium: 134 mEq/L — ABNORMAL LOW (ref 135–145)

## 2013-05-05 LAB — PROTIME-INR
INR: 1.68 — ABNORMAL HIGH (ref 0.00–1.49)
Prothrombin Time: 19.3 seconds — ABNORMAL HIGH (ref 11.6–15.2)

## 2013-05-05 LAB — PATHOLOGIST SMEAR REVIEW

## 2013-05-05 MED ORDER — WARFARIN SODIUM 7.5 MG PO TABS
7.5000 mg | ORAL_TABLET | Freq: Once | ORAL | Status: AC
  Administered 2013-05-05: 7.5 mg via ORAL
  Filled 2013-05-05: qty 1

## 2013-05-05 MED ORDER — POTASSIUM CHLORIDE 20 MEQ PO PACK: 20.0000 meq | PACK | Freq: Two times a day (BID) | ORAL | Status: DC

## 2013-05-05 MED ORDER — OLMESARTAN-AMLODIPINE-HCTZ 20-5-12.5 MG PO TABS: 1.0000 | ORAL_TABLET | Freq: Every day | ORAL | Status: DC

## 2013-05-05 MED ORDER — POTASSIUM CHLORIDE CRYS ER 20 MEQ PO TBCR
20.0000 meq | EXTENDED_RELEASE_TABLET | Freq: Two times a day (BID) | ORAL | Status: DC
  Administered 2013-05-05 – 2013-05-09 (×8): 20 meq via ORAL
  Filled 2013-05-05 (×10): qty 1

## 2013-05-05 MED ORDER — AMLODIPINE BESYLATE 5 MG PO TABS
5.0000 mg | ORAL_TABLET | Freq: Every day | ORAL | Status: DC
  Administered 2013-05-06 – 2013-05-09 (×3): 5 mg via ORAL
  Filled 2013-05-05 (×4): qty 1

## 2013-05-05 MED ORDER — IRBESARTAN 150 MG PO TABS
150.0000 mg | ORAL_TABLET | Freq: Every day | ORAL | Status: DC
  Administered 2013-05-06 – 2013-05-09 (×3): 150 mg via ORAL
  Filled 2013-05-05 (×4): qty 1

## 2013-05-05 MED ORDER — HYDROCHLOROTHIAZIDE 12.5 MG PO CAPS
12.5000 mg | ORAL_CAPSULE | Freq: Every day | ORAL | Status: DC
  Administered 2013-05-06 – 2013-05-09 (×3): 12.5 mg via ORAL
  Filled 2013-05-05 (×4): qty 1

## 2013-05-05 NOTE — Progress Notes (Signed)
ANTICOAGULATION CONSULT NOTE - Follow up  Pharmacy Consult for Lovenox and warfarin Indication:  AT III deficiency, hx arterial thromboembolism  Allergies  Allergen Reactions  . Mesalamine     REACTION: Rash   Patient Measurements: Height: 5\' 9"  (175.3 cm) Weight: 208 lb 7.5 oz (94.56 kg) IBW/kg (Calculated) : 70.7  Vital Signs: Temp: 98 F (36.7 C) (09/29 0456) Temp src: Oral (09/29 0456) BP: 168/107 mmHg (09/29 0456) Pulse Rate: 77 (09/29 0456)  Labs:  Recent Labs  05/03/13 0556 05/04/13 0516 05/05/13 0408  HGB 10.2* 10.8* 10.7*  HCT 32.3* 32.2* 33.0*  PLT 940* 931* 857*  LABPROT 16.6* 17.8* 19.3*  INR 1.38 1.51* 1.68*  CREATININE 0.88 0.70 0.69   Estimated Creatinine Clearance: 119.9 ml/min (by C-G formula based on Cr of 0.69).  Assessment: 57 yo male s/p ileostomy takedown with ileorectal anastomosis, was on warfarin PTA for ATIII deficiency with arterial thrombi.  Warfarin was stopped 5 days prior to surgery and Lovenox 135mg  SQ daily administered 9/9 thru 9/11 AM, then surgery on 9/12. Warfarin was restarted 9/12 with pharmacy dosing.  Received low-dose SQ heparin then full-dose LMWH until INR therapeutic. Warfarin stopped 9/24 and received vitamin K 5mg  IV for reversal for procedure.  PTA warfarin dose from 9/5 anticoag visit: 7.5mg  daily except 10mg  Wed. PTA warfarin dose reported by patient: 7.5mg  daily except 10mg  Wed/Sat.   INR is sub-therapeutic but rising after 10mg  --> 7.5mg  --> 7.5mg    H/H stable. Platelets remain elevated. No reports of bleeding.  Tolerating FL diet.   Zosyn can increase sensitivity to warfarin.   Goal of Therapy:  INR 2-3   Plan:   Cont Lovenox 90mg  q12h until INR is therapeutic.  Warfarin 7.5mg  today.  Daily PT/INR.  Juliette Alcide, PharmD, BCPS.   Pager: [phone]  05/05/2013,10:50 AM.

## 2013-05-05 NOTE — Progress Notes (Addendum)
Patient ID: Rick Edwards, male   DOB: 21-May-1956, 57 y.o.   MRN: [id number] 17 Days Post-Op  Subjective: No major complaints this morning. On questioning he still has some occasional left mid abdominal pain but says this is slowly getting better. Tolerating full liquids with no nausea. Having bowel movements.  Objective: Vital signs in last 24 hours: Temp:  [98 F (36.7 C)-98.2 F (36.8 C)] 98 F (36.7 C) (09/29 0456) Pulse Rate:  [77-86] 77 (09/29 0456) Resp:  [18] 18 (09/29 0456) BP: (157-168)/(97-115) 168/107 mmHg (09/29 0456) SpO2:  [96 %-97 %] 96 % (09/29 0456) Last BM Date: 05/05/13  Intake/Output from previous day: 09/28 0701 - 09/29 0700 In: 1320.7 [P.O.:720; I.V.:600.7] Out: -  Intake/Output this shift: Total I/O In: 330 [I.V.:80; IV Piggyback:250] Out: -   General appearance: alert, cooperative and no distress GI: mild distention. Soft and nontender. Incision/Wound: VAC in place without signs of infection  Lab Results:   Recent Labs  05/04/13 0516 05/05/13 0408  WBC 10.9* 7.4  HGB 10.8* 10.7*  HCT 32.2* 33.0*  PLT 931* 857*   BMET  Recent Labs  05/04/13 0516 05/05/13 0408  NA 137 134*  K 2.8* 3.0*  CL 99 98  CO2 27 25  GLUCOSE 126* 104*  BUN 4* 4*  CREATININE 0.70 0.69  CALCIUM 8.5 8.5   Lab Results  Component Value Date   INR 1.68* 05/05/2013   INR 1.51* 05/04/2013   INR 1.38 05/03/2013     Studies/Results: No results found.  Anti-infectives: Anti-infectives   Start     Dose/Rate Route Frequency Ordered Stop   04/30/13 0900  piperacillin-tazobactam (ZOSYN) IVPB 3.375 g     3.375 g 12.5 mL/hr over 240 Minutes Intravenous Every 8 hours 04/30/13 0811     04/18/13 0747  cefOXitin (MEFOXIN) 2 g in dextrose 5 % 50 mL IVPB     2 g 100 mL/hr over 30 Minutes Intravenous On call to O.R. 04/18/13 0747 04/18/13 1144      Assessment/Plan: s/p Procedure(s): ILEOSTOMY TAKEDOWN Postop intra-abdominal fluid collections, possible abscess. No  leak by CT scan. Not amenable to percutaneous drainage. Continue IV antibiotics and plan a repeat CT later this week.  Continue full liquid diet until distention completely resolves Hypertension. Restart home meds Hypokalemia -replace with oral potassium Antithrombin III deficiency-on Lovenox and warfarin per pharmacy.   LOS: 17 days    Semiyah Newgent T 05/05/2013

## 2013-05-06 LAB — CBC
HCT: 32.3 % — ABNORMAL LOW (ref 39.0–52.0)
MCHC: 33.1 g/dL (ref 30.0–36.0)
MCV: 90.2 fL (ref 78.0–100.0)
Platelets: 954 10*3/uL (ref 150–400)
RDW: 14.3 % (ref 11.5–15.5)

## 2013-05-06 LAB — PROTIME-INR: INR: 1.97 — ABNORMAL HIGH (ref 0.00–1.49)

## 2013-05-06 MED ORDER — WARFARIN SODIUM 5 MG PO TABS
5.0000 mg | ORAL_TABLET | Freq: Once | ORAL | Status: AC
  Administered 2013-05-06: 5 mg via ORAL
  Filled 2013-05-06: qty 1

## 2013-05-06 MED ORDER — ENSURE COMPLETE PO LIQD
237.0000 mL | Freq: Three times a day (TID) | ORAL | Status: DC
  Administered 2013-05-06 – 2013-05-08 (×6): 237 mL via ORAL

## 2013-05-06 NOTE — Progress Notes (Signed)
ANTICOAGULATION CONSULT NOTE - Follow up  Pharmacy Consult for Lovenox and warfarin Indication:  AT III deficiency, hx arterial thromboembolism  Allergies  Allergen Reactions  . Mesalamine     REACTION: Rash   Patient Measurements: Height: 5\' 9"  (175.3 cm) Weight: 208 lb 7.5 oz (94.56 kg) IBW/kg (Calculated) : 70.7  Vital Signs: Temp: 98 F (36.7 C) (09/30 0526) Temp src: Oral (09/30 0526) BP: 142/78 mmHg (09/30 0526) Pulse Rate: 90 (09/30 0526)  Labs:  Recent Labs  05/04/13 0516 05/05/13 0408 05/06/13 0427  HGB 10.8* 10.7* 10.7*  HCT 32.2* 33.0* 32.3*  PLT 931* 857* 954*  LABPROT 17.8* 19.3* 21.8*  INR 1.51* 1.68* 1.97*  CREATININE 0.70 0.69  --    Estimated Creatinine Clearance: 119.9 ml/min (by C-G formula based on Cr of 0.69).  Assessment: 57 yo male s/p ileostomy takedown with ileorectal anastomosis, was on warfarin PTA for ATIII deficiency with arterial thrombi.  Warfarin was stopped 5 days prior to surgery and Lovenox 135mg  SQ daily administered 9/9 thru 9/11 AM, then surgery on 9/12. Warfarin was restarted 9/12 with pharmacy dosing.  Received low-dose SQ heparin then full-dose LMWH until INR therapeutic. Warfarin stopped 9/24 and received vitamin K 5mg  IV for reversal for procedure.  PTA warfarin dose from 9/5 anticoag visit: 7.5mg  daily except 10mg  Wed. PTA warfarin dose reported by patient: 7.5mg  daily except 10mg  Wed/Sat.   INR is 1.97 - slightly sub-therapeutic but rising toward goal  H/H stable. Platelets remain elevated. No reports of bleeding.  Tolerating FL diet.   Zosyn can increase sensitivity to warfarin.   Goal of Therapy:  INR 2-3   Plan:   Cont Lovenox 90mg  q12h until INR is therapeutic.  Warfarin 5mg  today.  Daily PT/INR.  Juliette Alcide, PharmD, BCPS.   Pager: [phone]  05/06/2013,7:30 AM.

## 2013-05-06 NOTE — Progress Notes (Signed)
Patient ID: KOREN SERMERSHEIM, male   DOB: 1956/02/22, 57 y.o.   MRN: [id number] Patient ID: TYRIK STETZER, male   DOB: Oct 13, 1955, 57 y.o.   MRN: [id number] 18 Days Post-Op   Subjective: No major complaints this morning. On questioning he still has some occasional left mid abdominal pain but says this is slowly getting better. Tolerating full liquids with no nausea. Having bowel movements.  Objective: Vital signs in last 24 hours: Temp:  [97.3 F (36.3 C)-98.2 F (36.8 C)] 98 F (36.7 C) (09/30 0526) Pulse Rate:  [81-90] 90 (09/30 0526) Resp:  [18] 18 (09/30 0526) BP: (142-154)/(78-99) 142/78 mmHg (09/30 0526) SpO2:  [94 %-98 %] 94 % (09/30 0526) Last BM Date: 05/05/13  Intake/Output from previous day: 09/29 0701 - 09/30 0700 In: 830 [I.V.:480; IV Piggyback:350] Out: -  Intake/Output this shift: Total I/O In: 240 [P.O.:240] Out: -   General appearance: alert, cooperative and no distress GI: mild distention. Soft and nontender. Incision/Wound: VAC in place without signs of infection  Lab Results:   Recent Labs  05/05/13 0408 05/06/13 0427  WBC 7.4 6.5  HGB 10.7* 10.7*  HCT 33.0* 32.3*  PLT 857* 954*   BMET  Recent Labs  05/04/13 0516 05/05/13 0408  NA 137 134*  K 2.8* 3.0*  CL 99 98  CO2 27 25  GLUCOSE 126* 104*  BUN 4* 4*  CREATININE 0.70 0.69  CALCIUM 8.5 8.5   Lab Results  Component Value Date   INR 1.97* 05/06/2013   INR 1.68* 05/05/2013   INR 1.51* 05/04/2013     Studies/Results: No results found.  Anti-infectives: Anti-infectives   Start     Dose/Rate Route Frequency Ordered Stop   04/30/13 0900  piperacillin-tazobactam (ZOSYN) IVPB 3.375 g     3.375 g 12.5 mL/hr over 240 Minutes Intravenous Every 8 hours 04/30/13 0811     04/18/13 0747  cefOXitin (MEFOXIN) 2 g in dextrose 5 % 50 mL IVPB     2 g 100 mL/hr over 30 Minutes Intravenous On call to O.R. 04/18/13 0747 04/18/13 1144      Assessment/Plan: s/p Procedure(s): ILEOSTOMY  TAKEDOWN Postop intra-abdominal fluid collections, possible abscess. No leak by CT scan. Not amenable to percutaneous drainage. Continue IV antibiotics and plan a repeat CT later this week.  Continue full liquid diet until distention completely resolves Hypertension. Restart home meds Hypokalemia -replace with oral potassium.  Repeat Bmet in AM Antithrombin III deficiency-on Lovenox and warfarin per pharmacy.   LOS: 18 days    Lasean Rahming T 05/06/2013

## 2013-05-06 NOTE — Progress Notes (Signed)
NUTRITION FOLLOW UP  Intervention:   - Ensure Complete TID - Encouraged continued excellent PO intake - Will continue to monitor   Nutrition Dx:   Inadequate oral intake related to inability to eat as evidenced by NPO - no longer appropriate, diet advanced  New nutrition dx: Increased nutrient needs related to wound VAC as evidenced by MD notes   Goal:   Advance diet as tolerated to low fiber diet - not met   Monitor:   Weights, labs, intake  Assessment:   Noted CT scan showed an intraabdominal fluid collection yesterday with probable abscess per MD notes. On full liquid diet until distention completely resolves. Met with pt who reports eating well, noted pt had consumed 100% of breakfast and has been drinking all of his [employer]. No new weights.    Pt with low potassium, getting oral replacement.    Height: Ht Readings from Last 1 Encounters:  04/18/13 5\' 9"  (1.753 m)    Weight Status:   Wt Readings from Last 1 Encounters:  04/18/13 208 lb 7.5 oz (94.56 kg)    Re-estimated needs:  Kcal: 1800-2000  Protein: 110-120g  Fluid: 1.8-2L/day   Skin: Wound VAC on abdomen   Diet Order: Full Liquid   Intake/Output Summary (Last 24 hours) at 05/06/13 1319 Last data filed at 05/06/13 1000  Gross per 24 hour  Intake    870 ml  Output      0 ml  Net    870 ml    Last BM: 9/29   Labs:   Recent Labs Lab 05/03/13 0556 05/04/13 0516 05/05/13 0408  NA 138 137 134*  K 3.1* 2.8* 3.0*  CL 101 99 98  CO2 28 27 25   BUN 5* 4* 4*  CREATININE 0.88 0.70 0.69  CALCIUM 8.6 8.5 8.5  GLUCOSE 115* 126* 104*    CBG (last 3)  No results found for this basename: GLUCAP,  in the last 72 hours  Scheduled Meds: . irbesartan  150 mg Oral Daily   And  . amLODipine  5 mg Oral Daily   And  . hydrochlorothiazide  12.5 mg Oral Daily  . antiseptic oral rinse  15 mL Mouth Rinse q12n4p  . chlorhexidine  15 mL Mouth Rinse BID  . enoxaparin (LOVENOX) injection  90 mg  Subcutaneous Q12H  . feeding supplement  1 Container Oral TID BM  . piperacillin-tazobactam (ZOSYN)  IV  3.375 g Intravenous Q8H  . potassium chloride  20 mEq Oral BID  . warfarin  5 mg Oral ONCE-1800  . Warfarin - Pharmacist Dosing Inpatient   Does not apply q1800    Continuous Infusions: . dextrose 5 % and 0.9 % NaCl with KCl 20 mEq/L 20 mL/hr at 05/05/13 2323    Rick Hedger MS, RD, LDN [phone] Pager [phone] After Hours Pager

## 2013-05-07 LAB — CBC
HCT: 34.6 % — ABNORMAL LOW (ref 39.0–52.0)
MCH: 29.7 pg (ref 26.0–34.0)
MCHC: 33.2 g/dL (ref 30.0–36.0)
MCV: 89.4 fL (ref 78.0–100.0)
Platelets: 990 10*3/uL (ref 150–400)
RDW: 14.4 % (ref 11.5–15.5)

## 2013-05-07 LAB — BASIC METABOLIC PANEL
CO2: 28 mEq/L (ref 19–32)
Calcium: 9 mg/dL (ref 8.4–10.5)
Creatinine, Ser: 0.76 mg/dL (ref 0.50–1.35)
Glucose, Bld: 109 mg/dL — ABNORMAL HIGH (ref 70–99)

## 2013-05-07 LAB — PROTIME-INR: INR: 2.02 — ABNORMAL HIGH (ref 0.00–1.49)

## 2013-05-07 MED ORDER — WARFARIN SODIUM 7.5 MG PO TABS
7.5000 mg | ORAL_TABLET | Freq: Once | ORAL | Status: AC
  Administered 2013-05-07: 7.5 mg via ORAL
  Filled 2013-05-07: qty 1

## 2013-05-07 NOTE — Progress Notes (Addendum)
ANTICOAGULATION CONSULT NOTE - Follow up  Pharmacy Consult for Lovenox and warfarin Indication:  AT III deficiency, hx arterial thromboembolism  Allergies  Allergen Reactions  . Mesalamine     REACTION: Rash   Patient Measurements: Height: 5\' 9"  (175.3 cm) Weight: 208 lb 7.5 oz (94.56 kg) IBW/kg (Calculated) : 70.7  Vital Signs: Temp: 97.6 F (36.4 C) (10/01 0531) Temp src: Oral (10/01 0531) BP: 167/107 mmHg (10/01 0531) Pulse Rate: 79 (10/01 0531)  Labs:  Recent Labs  05/05/13 0408 05/06/13 0427 05/07/13 0400  HGB 10.7* 10.7* 11.5*  HCT 33.0* 32.3* 34.6*  PLT 857* 954* 990*  LABPROT 19.3* 21.8* 22.2*  INR 1.68* 1.97* 2.02*  CREATININE 0.69  --  0.76   Estimated Creatinine Clearance: 119.9 ml/min (by C-G formula based on Cr of 0.76).  Assessment: 57 yo male s/p ileostomy takedown with ileorectal anastomosis, was on warfarin PTA for ATIII deficiency with arterial thrombi.  Warfarin was stopped 5 days prior to surgery and Lovenox 135mg  SQ daily administered 9/9 thru 9/11 AM, then surgery on 9/12. Warfarin was restarted 9/12 with pharmacy dosing.  Received low-dose SQ heparin then full-dose LMWH until INR therapeutic. Warfarin stopped 9/24 and received vitamin K 5mg  IV for reversal for procedure.  PTA warfarin dose from 9/5 anticoag visit: 7.5mg  daily except 10mg  Wed. PTA warfarin dose reported by patient: 7.5mg  daily except 10mg  Wed/Sat.   INR is 2.02 - low-end therapeutic range  H/H stable. Platelets remain elevated. No reports of bleeding.  Tolerating FL diet.   Zosyn D#8 can increase sensitivity to warfarin.   Goal of Therapy:  INR 2-3   Plan:   Warfarin 7.5mg  today.  Would continue lovenox for today as INR = 2.02, d/c if INR remains > 2 tomorrow morning.    Daily PT/INR.  Juliette Alcide, PharmD, BCPS.   Pager: [phone]  05/07/2013,7:22 AM.

## 2013-05-07 NOTE — Progress Notes (Signed)
Patient ID: Rick Edwards, male   DOB: [birth year], 57 y.o.   MRN: [id number] Patient ID: Rick Edwards, male   DOB: 10/09/1955, 57 y.o.   MRN: [id number] Patient ID: Rick Edwards, male   DOB: Aug 08, 1955, 57 y.o.   MRN: [id number] 19 Days Post-Op   Subjective: No  complaints this morning.  Tolerating full liquids with no nausea. Having bowel movements.  Objective: Vital signs in last 24 hours: Temp:  [97.6 F (36.4 C)-99.3 F (37.4 C)] 97.6 F (36.4 C) (10/01 0531) Pulse Rate:  [79-87] 79 (10/01 0531) Resp:  [16-18] 16 (10/01 0531) BP: (152-167)/(95-107) 167/107 mmHg (10/01 0531) SpO2:  [97 %-99 %] 98 % (10/01 0531) Last BM Date: 05/06/13  Intake/Output from previous day: 09/30 0701 - 10/01 0700 In: 1230 [P.O.:600; I.V.:480; IV Piggyback:150] Out: -  Intake/Output this shift:    General appearance: alert, cooperative and no distress GI: mild distention. Soft and nontender. Incision/Wound: VAC in place without signs of infection  Lab Results:   Recent Labs  05/06/13 0427 05/07/13 0400  WBC 6.5 6.7  HGB 10.7* 11.5*  HCT 32.3* 34.6*  PLT 954* 990*   BMET  Recent Labs  05/05/13 0408 05/07/13 0400  NA 134* 137  K 3.0* 3.3*  CL 98 99  CO2 25 28  GLUCOSE 104* 109*  BUN 4* 6  CREATININE 0.69 0.76  CALCIUM 8.5 9.0   Lab Results  Component Value Date   INR 2.02* 05/07/2013   INR 1.97* 05/06/2013   INR 1.68* 05/05/2013     Studies/Results: No results found.  Anti-infectives: Anti-infectives   Start     Dose/Rate Route Frequency Ordered Stop   04/30/13 0900  piperacillin-tazobactam (ZOSYN) IVPB 3.375 g     3.375 g 12.5 mL/hr over 240 Minutes Intravenous Every 8 hours 04/30/13 0811     04/18/13 0747  cefOXitin (MEFOXIN) 2 g in dextrose 5 % 50 mL IVPB     2 g 100 mL/hr over 30 Minutes Intravenous On call to O.R. 04/18/13 0747 04/18/13 1144      Assessment/Plan: s/p Procedure(s): ILEOSTOMY TAKEDOWN Postop intra-abdominal fluid collections,  possible abscess. No leak by CT scan. Not amenable to percutaneous drainage. Continue IV antibiotics and plan a repeat CT tomorrow  Continue full liquid diet until distention completely resolves Hypertension. Restart home meds Hypokalemia -improved Antithrombin III deficiency-on Lovenox and warfarin per pharmacy.  INR therapeutic   LOS: 19 days    Rashea Hoskie T 05/07/2013

## 2013-05-08 ENCOUNTER — Inpatient Hospital Stay (HOSPITAL_COMMUNITY): Payer: No Typology Code available for payment source

## 2013-05-08 MED ORDER — WARFARIN SODIUM 7.5 MG PO TABS
7.5000 mg | ORAL_TABLET | Freq: Once | ORAL | Status: AC
  Administered 2013-05-08: 7.5 mg via ORAL
  Filled 2013-05-08: qty 1

## 2013-05-08 MED ORDER — IOHEXOL 300 MG/ML  SOLN
100.0000 mL | Freq: Once | INTRAMUSCULAR | Status: AC | PRN
  Administered 2013-05-08: 100 mL via INTRAVENOUS

## 2013-05-08 MED ORDER — IOHEXOL 300 MG/ML  SOLN
25.0000 mL | INTRAMUSCULAR | Status: AC
  Administered 2013-05-08 (×2): 25 mL via ORAL

## 2013-05-08 NOTE — Progress Notes (Signed)
Patient ID: Rick Edwards, male   DOB: 07/26/1956, 57 y.o.   MRN: [id number] 20 Days Post-Op  Subjective: No complaints this morning. Tolerating diet. Still with regular semi-formed bowel movements.  Objective: Vital signs in last 24 hours: Temp:  [98 F (36.7 C)-98.3 F (36.8 C)] 98.1 F (36.7 C) (10/02 0526) Pulse Rate:  [75-90] 75 (10/02 0526) Resp:  [18] 18 (10/02 0526) BP: (149-163)/(87-105) 149/90 mmHg (10/02 0526) SpO2:  [97 %-98 %] 97 % (10/02 0526) Last BM Date: 05/08/13  Intake/Output from previous day: 10/01 0701 - 10/02 0700 In: 1060 [P.O.:480; I.V.:480; IV Piggyback:100] Out: 0  Intake/Output this shift:    General appearance: alert, cooperative and no distress GI: persistent mild to moderate distention. Soft and nontender.  Lab Results:   Recent Labs  05/06/13 0427 05/07/13 0400  WBC 6.5 6.7  HGB 10.7* 11.5*  HCT 32.3* 34.6*  PLT 954* 990*   BMET  Recent Labs  05/07/13 0400  NA 137  K 3.3*  CL 99  CO2 28  GLUCOSE 109*  BUN 6  CREATININE 0.76  CALCIUM 9.0   Lab Results  Component Value Date   INR 2.11* 05/08/2013   INR 2.02* 05/07/2013   INR 1.97* 05/06/2013     Studies/Results: No results found.  Anti-infectives: Anti-infectives   Start     Dose/Rate Route Frequency Ordered Stop   04/30/13 0900  piperacillin-tazobactam (ZOSYN) IVPB 3.375 g     3.375 g 12.5 mL/hr over 240 Minutes Intravenous Every 8 hours 04/30/13 0811     04/18/13 0747  cefOXitin (MEFOXIN) 2 g in dextrose 5 % 50 mL IVPB     2 g 100 mL/hr over 30 Minutes Intravenous On call to O.R. 04/18/13 0747 04/18/13 1144      Assessment/Plan: s/p Procedure(s): ILEOSTOMY TAKEDOWN For repeat CT scan today to evaluate abdominal fluid collections. Continue IV Zosyn for now. Therapeutic on warfarin for antithrombin III deficiency   LOS: 20 days    Lissett Favorite T 05/08/2013

## 2013-05-08 NOTE — Progress Notes (Signed)
ANTICOAGULATION CONSULT NOTE - Follow up  Pharmacy Consult for Lovenox and warfarin Indication:  AT III deficiency, hx arterial thromboembolism  Allergies  Allergen Reactions  . Mesalamine     REACTION: Rash   Patient Measurements: Height: 5\' 9"  (175.3 cm) Weight: 208 lb 7.5 oz (94.56 kg) IBW/kg (Calculated) : 70.7  Vital Signs: Temp: 98.1 F (36.7 C) (10/02 0526) Temp src: Oral (10/02 0526) BP: 149/90 mmHg (10/02 0526) Pulse Rate: 75 (10/02 0526)  Labs:  Recent Labs  05/06/13 0427 05/07/13 0400 05/08/13 0507  HGB 10.7* 11.5*  --   HCT 32.3* 34.6*  --   PLT 954* 990*  --   LABPROT 21.8* 22.2* 23.0*  INR 1.97* 2.02* 2.11*  CREATININE  --  0.76  --    Estimated Creatinine Clearance: 119.9 ml/min (by C-G formula based on Cr of 0.76).  Assessment: 57 yo male s/p ileostomy takedown with ileorectal anastomosis, was on warfarin PTA for ATIII deficiency with arterial thrombi.  Warfarin was stopped 5 days prior to surgery and Lovenox 135mg  SQ daily administered 9/9 thru 9/11 AM, then surgery on 9/12. Warfarin was restarted 9/12 with pharmacy dosing.  Received low-dose SQ heparin then full-dose LMWH until INR therapeutic. Warfarin stopped 9/24 and received vitamin K 5mg  IV for reversal for procedure.  PTA warfarin dose from 9/5 anticoag visit: 7.5mg  daily except 10mg  Wed. PTA warfarin dose reported by patient: 7.5mg  daily except 10mg  Wed/Sat.   INR is 2.11  H/H stable. Platelets remain elevated. No reports of bleeding.  Tolerating FL diet.   Zosyn D#9 can increase sensitivity to warfarin.   Goal of Therapy:  INR 2-3   Plan:   Warfarin 7.5mg  today.  D/C lovenox for therapeutic INR  Daily PT/INR.  Juliette Alcide, PharmD, BCPS.   Pager: [phone]  05/08/2013,8:34 AM.

## 2013-05-09 MED ORDER — POTASSIUM CHLORIDE CRYS ER 20 MEQ PO TBCR: 20.0000 meq | EXTENDED_RELEASE_TABLET | Freq: Every day | ORAL | Status: DC

## 2013-05-09 MED ORDER — OXYCODONE-ACETAMINOPHEN 5-325 MG PO TABS: 1.0000 | ORAL_TABLET | Freq: Four times a day (QID) | ORAL | Status: DC | PRN

## 2013-05-09 MED ORDER — AMOXICILLIN-POT CLAVULANATE 875-125 MG PO TABS: 1.0000 | ORAL_TABLET | Freq: Two times a day (BID) | ORAL | Status: DC

## 2013-05-09 MED ORDER — WARFARIN SODIUM 7.5 MG PO TABS
7.5000 mg | ORAL_TABLET | Freq: Once | ORAL | Status: DC
  Filled 2013-05-09: qty 1

## 2013-05-09 NOTE — Discharge Summary (Signed)
Patient ID: Rick Edwards [id number] 57 y.o. Sep 02, 1955  04/18/2013  Discharge date and time: 05/09/2013   Admitting Physician: Glenna Fellows T  Discharge Physician: Glenna Fellows T  Admission Diagnoses: ileostomy  And Crohn's colitis  Discharge Diagnoses: Same  Operations: Procedure(s): ILEOSTOMY TAKEDOWN  Admission Condition: fair  Discharged Condition: fair  Indication for Admission: Patient is a 57 year old male with an extensive previous history of Crohn's colitis. Most recently about 9 months ago he had a left colectomy leaving him with a rectosigmoid stump and end ileostomy. He strongly desires ileostomy takedown. He has had a preoperative evaluation including endoscopy and upper GI series showing no active Crohn's disease. Following extensive discussion of risks and options preoperatively detailed elsewhere he is admitted electively for takedown of his ileostomy.  Hospital Course: The patient was admitted on the morning of this procedure. At the time of surgery he had fairly extensive adhesions but the procedure went smoothly and he had a stapled anastomosis of his ileum to the rectosigmoid. Initially postoperatively he was very stable. Some ileus and abdominal distention were noted. Due the patient's antithrombin III deficiency he was maintained on subcutaneous Lovenox managed by pharmacy. At about one week postop he was noted to have some drainage from his midline wound which was opened and some purulent fluid reportedly obtained. There was no fever or elevated white count at this point. His wound was treated with saline gauze packing and then converted to a wound VAC. He was tolerating a liquid diet well but remained with some distention and abdominal x-rays were consistent with ileus. However about 12 days postop he did develop a low-grade fever and an elevated white count to 16,000. At this point I empirically started him on IV Zosyn and obtained a CT scan of the  abdomen and pelvis. This showed a pelvic fluid collection between the rectum and the bladder measuring about 3 x 6 cm possibly consistent with abscess and a less defined fluid collection along the left lateral abdomen. There was no contrast extravasation to suggest active leak. On antibiotics his white count quickly returned to normal. We considered percutaneous drainage and his Coumadin was reversed with FFP and vitamin K. However there did not appear to be a safe window for placement of a percutaneous drain and we therefore elected to treat with antibiotics alone. During this time he was having minimal discomfort and tolerating a liquid diet without difficulty but continued to have some moderate abdominal distention. He remained on IV Zosyn for over one week. He tolerated liquid diet well and his white count returned to normal and remained there. He had some low-grade fever for a few days but resolved. CT scan was repeated on October 2 which showed significant improvement in his pelvic fluid collection although there were 2 small dots of air felt to be outside the bowel wall near the anastomosis. This was reviewed carefully with the radiologist and despite the small foci of air there seemed to be overall significant improvement in inflammatory change in the fluid collection. On October 3 he is feeling well without any significant pain. His abdominal distention at this point is markedly improved. He is afebrile with a normal white count. I feel he is ready for discharge. We will followup with 2 weeks of oral Augmentin. Instructions were given to call immediately for any increasing pain or fever or vomiting or other concerns. Home health will manage his wound VAC. He is on his regular dose of warfarin and will be followed short-term  in the Coumadin clinic. He is blood pressure has been moderately elevated consistently and he will contact his family physician immediately a discharge for possible modification of his  blood pressure medication. At discharge his abdomen is soft and nondistended and nontender and his wound appears clean.  Consults: interventional radiology  Significant Diagnostic Studies: radiology: CT scan: x2 as described above  Treatments: antibiotics: Zosyn and Wound VAC  Disposition: Home  Patient Instructions:    Medication List    STOP taking these medications       azaTHIOprine 50 MG tablet  Commonly known as:  IMURAN     enoxaparin 150 MG/ML injection  Commonly known as:  LOVENOX      TAKE these medications       amoxicillin-clavulanate 875-125 MG per tablet  Commonly known as:  AUGMENTIN  Take 1 tablet by mouth 2 (two) times daily.     Olmesartan-Amlodipine-HCTZ 20-5-12.5 MG Tabs  Commonly known as:  TRIBENZOR  Take 1 tablet by mouth daily after breakfast.     oxyCODONE-acetaminophen 5-325 MG per tablet  Commonly known as:  PERCOCET/ROXICET  Take 1-2 tablets by mouth every 6 (six) hours as needed.     potassium chloride SA 20 MEQ tablet  Commonly known as:  K-DUR,KLOR-CON  Take 1 tablet (20 mEq total) by mouth daily.     warfarin 5 MG tablet  Commonly known as:  COUMADIN  Take 5 mg by mouth daily. Takes 1 tablet on Wednesday and Saturday and 1.5 tablets the rest of the week        Activity: no lifting, driving, or strenuous exercise for or weeks Diet: full liquid diet Wound Care: Vac wound dressing per home health  Follow-up:  With Dr. Johna Sheriff in 2 weeks.  Signed: Mariella Saa MD, FACS  05/09/2013, 10:20 AM

## 2013-05-09 NOTE — Progress Notes (Signed)
Patient ID: Rick Edwards, male   DOB: 05-17-1956, 57 y.o.   MRN: [id number] 21 Days Post-Op  Subjective: No complaints this morning. Minimal occasional pain it continues to improve. Tolerating full liquid no nausea. Bowels moving regularly.  Objective: Vital signs in last 24 hours: Temp:  [97.9 F (36.6 C)-98.4 F (36.9 C)] 98.1 F (36.7 C) (10/03 0549) Pulse Rate:  [72-106] 89 (10/03 0549) Resp:  [18] 18 (10/03 0549) BP: (141-164)/(91-99) 141/91 mmHg (10/03 0549) SpO2:  [94 %-98 %] 94 % (10/03 0549) Last BM Date: 05/09/13  Intake/Output from previous day: 10/02 0701 - 10/03 0700 In: 1440 [P.O.:960; I.V.:480] Out: -  Intake/Output this shift:    General appearance: alert, cooperative and no distress GI: distention is much improved today. Soft and nontender. Incision/Wound: VAC in place without unusual drainage or evidence of infection  Lab Results:   Recent Labs  05/07/13 0400  WBC 6.7  HGB 11.5*  HCT 34.6*  PLT 990*   BMET  Recent Labs  05/07/13 0400  NA 137  K 3.3*  CL 99  CO2 28  GLUCOSE 109*  BUN 6  CREATININE 0.76  CALCIUM 9.0     Studies/Results: Ct Abdomen Pelvis W Contrast  05/08/2013   CLINICAL DATA:  Follow-up fluid collection, status post ileostomy take-down, history of Crohn's disease  EXAM: CT ABDOMEN AND PELVIS WITH CONTRAST  TECHNIQUE: Multidetector CT imaging of the abdomen and pelvis was performed using the standard protocol following bolus administration of intravenous contrast.  CONTRAST:  OMNIPAQUE IOHEXOL 300 MG/ML  SOLN  COMPARISON:  CT pelvis dated 05/02/2013. CT abdomen pelvis dated 04/30/2013.  FINDINGS: Mild patchy bilateral lower lobe opacities, likely atelectasis.  Liver, spleen, pancreas, and adrenal glands within normal limits.  Gallbladder is unremarkable. No intrahepatic or extrahepatic ductal dilatation.  Bilateral renal cysts and too small to characterize lesions measuring up to 1.4 cm in the left upper pole (series  2/ image 38). No hydronephrosis.  Status post subtotal colectomy with ileorectal anastomosis. Recent prior ileostomy take down. Mildly prominent/dilated loops of small bowel in the right upper abdomen, likely reflecting adynamic ileus. Contrast opacifies distal small bowel proximal to the anastomosis in the right pelvis (series 2/image 74).  Residual 2.1 x 3.7 cm pelvic fluid collection (series 2/image 29), decreased. Tiny foci of extraluminal gas in the pelvis (series 2/image 76), new.  Atherosclerotic calcifications of the abdominal aorta and branch vessels.  No abdominopelvic ascites. Mild mesenteric stranding in the right lower quadrant (series 2/image 66).  No suspicious abdominopelvic lymphadenopathy.  Prostate is unremarkable.  Bladder is mildly thick-walled although underdistended.  Postsurgical changes in the midline anterior abdominal wall. Mild stranding with minimal ill-defined fluid (series 2/ image 71).  Mild degenerative changes at L5-S1.  IMPRESSION: Status post subtotal colectomy with ileostomy takedown and ileorectal anastomosis.  2.1 x 3.7 cm pelvic fluid collection with, decreased. Associated small foci of extraluminal gas near the anastomosis.   Electronically Signed   By: Charline Bills M.D.   On: 05/08/2013 11:34    Anti-infectives: Anti-infectives   Start     Dose/Rate Route Frequency Ordered Stop   04/30/13 0900  piperacillin-tazobactam (ZOSYN) IVPB 3.375 g     3.375 g 12.5 mL/hr over 240 Minutes Intravenous Every 8 hours 04/30/13 0811     04/18/13 0747  cefOXitin (MEFOXIN) 2 g in dextrose 5 % 50 mL IVPB     2 g 100 mL/hr over 30 Minutes Intravenous On call to O.R. 04/18/13 4098 04/18/13  1144      Assessment/Plan: s/p Procedure(s): ILEOSTOMY TAKEDOWN Postop pelvic fluid collections. I reviewed the repeat CT scan in detail with the radiologist. There are 2 new tiny spots of air in the pelvis near the anastomosis. However everything else is significantly improved with  significant reduction in the fluid collection and inflammatory changes. I think this likely represented a contained anastomotic leak. The patient however is clinically doing quite well with no evidence of infection at this point with normal white count and afebrile, feeling well and abdomen is benign. I believe he is okay for discharge. We are going to followup antibiotic treatment at home with 10 days of Augmentin. He will be followed closely as an outpatient. Blood pressure is running somewhat high and he will contact his family physician at discharge for evaluation for this. Resume normal warfarin dose in followup short-term in the anticoagulation clinic. We will have home health for wound VAC changes and assessment. Discussed all this with the patient and his family.   LOS: 21 days    Philopater Mucha T 05/09/2013

## 2013-05-09 NOTE — Progress Notes (Signed)
ANTICOAGULATION CONSULT NOTE - Follow up  Pharmacy Consult for Lovenox and warfarin Indication:  AT III deficiency, hx arterial thromboembolism  Allergies  Allergen Reactions  . Mesalamine     REACTION: Rash   Patient Measurements: Height: 5\' 9"  (175.3 cm) Weight: 208 lb 7.5 oz (94.56 kg) IBW/kg (Calculated) : 70.7  Vital Signs: Temp: 98.1 F (36.7 C) (10/03 0549) Temp src: Oral (10/03 0549) BP: 141/91 mmHg (10/03 0549) Pulse Rate: 89 (10/03 0549)  Labs:  Recent Labs  05/07/13 0400 05/08/13 0507 05/09/13 0425  HGB 11.5*  --   --   HCT 34.6*  --   --   PLT 990*  --   --   LABPROT 22.2* 23.0* 22.9*  INR 2.02* 2.11* 2.10*  CREATININE 0.76  --   --    Estimated Creatinine Clearance: 119.9 ml/min (by C-G formula based on Cr of 0.76).  Assessment: 57 yo male s/p ileostomy takedown with ileorectal anastomosis, was on warfarin PTA for ATIII deficiency with arterial thrombi.  Warfarin was stopped 5 days prior to surgery and Lovenox 135mg  SQ daily administered 9/9 thru 9/11 AM, then surgery on 9/12. Warfarin was restarted 9/12 with pharmacy dosing.  Received low-dose SQ heparin then full-dose LMWH until INR therapeutic. Warfarin stopped 9/24 and received vitamin K 5mg  IV for reversal for procedure.  PTA warfarin dose from 9/5 anticoag visit: 7.5mg  daily except 10mg  Wed. PTA warfarin dose reported by patient: 7.5mg  daily except 10mg  Wed/Sat.   INR is 2.10  No new labs. 10/1 H/H stable. Platelets remain elevated. No reports of bleeding.  Tolerating FL diet.   Zosyn D#10 can increase sensitivity to warfarin.   Goal of Therapy:  INR 2-3   Plan:   Warfarin 7.5mg  today.  Daily PT/INR.  Lovenox stopped 10/2  Juliette Alcide, PharmD, BCPS.   Pager: [phone]  05/09/2013,7:30 AM.

## 2013-05-12 ENCOUNTER — Telehealth (INDEPENDENT_AMBULATORY_CARE_PROVIDER_SITE_OTHER): Payer: Self-pay | Admitting: *Deleted

## 2013-05-12 NOTE — Telephone Encounter (Signed)
Called and left message with date & time of appointment for 05/15/13 @ 1:15 pm w/Dr. Johna Sheriff.  Left message to call our office back if this date & time does not work for patient.

## 2013-05-12 NOTE — Telephone Encounter (Deleted)
Post Op appointment made for patient for 05/15/2013 @ 1:15 pm w/Dr. Johna Sheriff.  Will notify patient of appointment.

## 2013-05-12 NOTE — Telephone Encounter (Signed)
Patient's father called today to setup patient's 2 week PO appt.  Unforetunetly I am unable to find anything with Dr. Johna Sheriff in 2 weeks so explained that I will need to send them a message to ask for a PO appt then we can let him know.  He is also stating that patient needs a refill of his pain medication.  Patient's pain is improving but he is still having pain at his surgical site.  Denies fever, redness, or drainage from incision.  He also denies that patient is having trouble with urination and BMs.  Explained to father that per protocol I will get a prescription for Norco 5/325mg  Take 1 tablet every 4-6 hours as needed for pain #30 no refills written out for patient.  I explained that Dr. Johna Sheriff is not in office today but I will work on getting another MD to sign it then will give him a call when it is ready to be picked up.  He is on patients HIPAA form and would be able to pick up prescription.  Father states understanding to all and agreeable at this time.

## 2013-05-12 NOTE — Telephone Encounter (Signed)
After reviewing patient's chart further it shows that patient just received a prescription for Percocet #40 on 05/09/13.  Called back and explained that it was too soon to receive another prescription for pain medication.  Informed patient and father of appt on this Thursday 05/15/13 and they could discuss with Dr. Johna Sheriff at that time any refills requests.  Patient and father states understanding and agreeable with plan at this time.

## 2013-05-13 ENCOUNTER — Other Ambulatory Visit (INDEPENDENT_AMBULATORY_CARE_PROVIDER_SITE_OTHER): Payer: Self-pay | Admitting: *Deleted

## 2013-05-13 MED ORDER — OXYCODONE-ACETAMINOPHEN 5-325 MG PO TABS: 1.0000 | ORAL_TABLET | Freq: Four times a day (QID) | ORAL | Status: DC | PRN

## 2013-05-13 NOTE — Telephone Encounter (Signed)
Spoke to Dr. Johna Sheriff who approved prescription for Percocet 5/325mg  Take 1-2 tablets every 6 hours as needed for pain #50 no refills.  Dr. Johna Sheriff did state that patient needs to slow down on how many he is taking since his surgery was 04/18/13.  Prescription written at this time and awaiting urgent office MD to sign.  Will update Gina once prescription is signed that she can come and pick it up.

## 2013-05-13 NOTE — Telephone Encounter (Signed)
Received a call from [facility] with [facility].  He wanted to make me aware that patient has been using way more of his Percocet then how it is prescribed.  Lorin Picket stated that he had a long discuss with both patient and his father regarding cutting back on how many Percocet his is taking. Updated Scott regarding my conversation with patient's sister.  It sounds like we are both on the same page.

## 2013-05-13 NOTE — Telephone Encounter (Signed)
Spoke to patient's sister to let her know the prescription had been signed and was ready for pick up at the front desk.  Had an extended conversation with Dondra Spry regarding patient needing to slow down on how many Percocet he is taking per day.  Explained that with his surgery being 04/18/13 we are starting to get to the end of being able to prescribe for narcotic pain meds.  Explained that patient really needs to try to work on taking less and going longer.  Dondra Spry states understanding at this time and agreeable with current plan.

## 2013-05-13 NOTE — Telephone Encounter (Addendum)
Received a call from Tobey Grim (pt's sister) stating that patient is in extreme pain.  She explained that patient did take the pain medication more than was prescribed and has been taking closer to 10 per day rather than 8.  I explained that yesterday when I spoke to her father I was unaware that patient was completely out of his pain medication and that he was having these pain control issues.  Almira Coaster states that today the home health nurse came to do the dressing change for the patient and he has been in extreme pain ever since.  Explained to her that I would page Dr. Johna Sheriff to discuss appropriate plan with him and see what we can figure out for the patient then I will let her know.  She states understanding and agreeable at this time.  Awaiting call back from Dr. Johna Sheriff at this time.  Patient is coming in for PO appt with Dr. Johna Sheriff this Thursday 05/15/13.

## 2013-05-15 ENCOUNTER — Encounter (INDEPENDENT_AMBULATORY_CARE_PROVIDER_SITE_OTHER): Payer: Self-pay

## 2013-05-15 ENCOUNTER — Ambulatory Visit (INDEPENDENT_AMBULATORY_CARE_PROVIDER_SITE_OTHER): Payer: No Typology Code available for payment source | Admitting: General Surgery

## 2013-05-15 VITALS — BP 108/78 | HR 88 | Temp 97.9°F | Resp 24 | Ht 69.0 in | Wt 177.4 lb

## 2013-05-15 DIAGNOSIS — Z09 Encounter for follow-up examination after completed treatment for conditions other than malignant neoplasm: Secondary | ICD-10-CM

## 2013-05-15 NOTE — Progress Notes (Signed)
Chief complaint: Followup ileostomy takedown  History: Patient returns for his first office visit status post ileostomy takedown with anastomosis of terminal ileum to rectosigmoid with a previous history of Crohn's colitis an extensive surgical history. Postoperatively his course was complicated by prolonged ileus and then a pelvic fluid collection that possibly was a small contained leak although a definite leak could not be confirmed. He did have a couple of dots of air in the pelvis but his last CT scan prior to discharge so significant reduction in the fluid collection. His white count was normal at that time. He and his father states that since being at home he is really only been doing so-so. He complains of a lot of pain but it seems to be incisional pain and related mostly to his VAC dressing when it is changed or when the suction comes on. He denies deep abdominal pain. He states he has felt a little feverish at times but has not taken his temperature. He is a week and when he gets up to walk he quickly becomes winded. He is actually tolerating his full liquid diet with no difficulty and is having several semi-formed bowel movements per day.  Exam: BP 108/78  Pulse 88  Temp(Src) 97.9 F (36.6 C) (Temporal)  Resp 24  Ht 5\' 9"  (1.753 m)  Wt 177 lb 6.4 oz (80.468 kg)  BMI 26.19 kg/m2 General: Appears somewhat weak but not acutely ill. Abdomen: VAC dressing is in place with serosanguineous drainage. No erythema. There is minimal abdominal distention which is better than when he was in the hospital. No appreciable tenderness. His right lower quadrant ileostomy site is well healed I removed the staples.  Assessment and plan: Status post ileostomy takedown as above with possible small contained leak improved on antibiotics in the hospital. He currently is on Augmentin. He is not progressing that well but I do not see any specific problems today such as tenderness or fever. I'm going to check lab  work. I advised the patient and his father to call immediately or go to the emergency room if he is feeling significantly worse. We are going to stop the [facility] dressing as this appears to be causing a lot of his pain. He returned at my next available office this month.

## 2013-05-16 ENCOUNTER — Telehealth (INDEPENDENT_AMBULATORY_CARE_PROVIDER_SITE_OTHER): Payer: Self-pay

## 2013-05-16 ENCOUNTER — Inpatient Hospital Stay (HOSPITAL_COMMUNITY)
Admission: EM | Admit: 2013-05-16 | Discharge: 2013-05-20 | DRG: 683 | Disposition: A | Discharge status: Home / Self Care | Payer: No Typology Code available for payment source | Attending: Internal Medicine | Admitting: Internal Medicine

## 2013-05-16 ENCOUNTER — Inpatient Hospital Stay (HOSPITAL_COMMUNITY): Payer: No Typology Code available for payment source

## 2013-05-16 ENCOUNTER — Encounter (HOSPITAL_COMMUNITY): Payer: Self-pay | Admitting: Emergency Medicine

## 2013-05-16 DIAGNOSIS — Z87891 Personal history of nicotine dependence: Secondary | ICD-10-CM

## 2013-05-16 DIAGNOSIS — R066 Hiccough: Secondary | ICD-10-CM

## 2013-05-16 DIAGNOSIS — I743 Embolism and thrombosis of arteries of the lower extremities: Secondary | ICD-10-CM

## 2013-05-16 DIAGNOSIS — E861 Hypovolemia: Secondary | ICD-10-CM | POA: Diagnosis present

## 2013-05-16 DIAGNOSIS — D735 Infarction of spleen: Secondary | ICD-10-CM

## 2013-05-16 DIAGNOSIS — N059 Unspecified nephritic syndrome with unspecified morphologic changes: Secondary | ICD-10-CM | POA: Diagnosis present

## 2013-05-16 DIAGNOSIS — Z7901 Long term (current) use of anticoagulants: Secondary | ICD-10-CM

## 2013-05-16 DIAGNOSIS — E872 Acidosis, unspecified: Secondary | ICD-10-CM | POA: Diagnosis present

## 2013-05-16 DIAGNOSIS — Z932 Ileostomy status: Secondary | ICD-10-CM

## 2013-05-16 DIAGNOSIS — K567 Ileus, unspecified: Secondary | ICD-10-CM

## 2013-05-16 DIAGNOSIS — K5010 Crohn's disease of large intestine without complications: Secondary | ICD-10-CM | POA: Diagnosis present

## 2013-05-16 DIAGNOSIS — I10 Essential (primary) hypertension: Secondary | ICD-10-CM | POA: Diagnosis present

## 2013-05-16 DIAGNOSIS — D6859 Other primary thrombophilia: Secondary | ICD-10-CM

## 2013-05-16 DIAGNOSIS — D473 Essential (hemorrhagic) thrombocythemia: Secondary | ICD-10-CM | POA: Diagnosis present

## 2013-05-16 DIAGNOSIS — E860 Dehydration: Secondary | ICD-10-CM | POA: Diagnosis present

## 2013-05-16 DIAGNOSIS — N170 Acute kidney failure with tubular necrosis: Principal | ICD-10-CM | POA: Diagnosis present

## 2013-05-16 DIAGNOSIS — R5381 Other malaise: Secondary | ICD-10-CM

## 2013-05-16 DIAGNOSIS — N179 Acute kidney failure, unspecified: Secondary | ICD-10-CM

## 2013-05-16 DIAGNOSIS — E875 Hyperkalemia: Secondary | ICD-10-CM

## 2013-05-16 DIAGNOSIS — K5090 Crohn's disease, unspecified, without complications: Secondary | ICD-10-CM | POA: Diagnosis present

## 2013-05-16 DIAGNOSIS — D126 Benign neoplasm of colon, unspecified: Secondary | ICD-10-CM

## 2013-05-16 DIAGNOSIS — R112 Nausea with vomiting, unspecified: Secondary | ICD-10-CM

## 2013-05-16 DIAGNOSIS — R531 Weakness: Secondary | ICD-10-CM

## 2013-05-16 DIAGNOSIS — H9190 Unspecified hearing loss, unspecified ear: Secondary | ICD-10-CM

## 2013-05-16 DIAGNOSIS — T50995A Adverse effect of other drugs, medicaments and biological substances, initial encounter: Secondary | ICD-10-CM | POA: Diagnosis present

## 2013-05-16 DIAGNOSIS — R972 Elevated prostate specific antigen [PSA]: Secondary | ICD-10-CM

## 2013-05-16 DIAGNOSIS — Z23 Encounter for immunization: Secondary | ICD-10-CM

## 2013-05-16 HISTORY — DX: Unspecified hearing loss, unspecified ear: H91.90

## 2013-05-16 HISTORY — DX: Other primary thrombophilia: D68.59

## 2013-05-16 LAB — COMPREHENSIVE METABOLIC PANEL
ALT: 56 U/L — ABNORMAL HIGH (ref 0–53)
Albumin: 3.5 g/dL (ref 3.5–5.2)
BUN: 65 mg/dL — ABNORMAL HIGH (ref 6–23)
CO2: 15 mEq/L — ABNORMAL LOW (ref 19–32)
Calcium: 9.3 mg/dL (ref 8.4–10.5)
Creatinine, Ser: 6.11 mg/dL — ABNORMAL HIGH (ref 0.50–1.35)
GFR calc Af Amer: 11 mL/min — ABNORMAL LOW (ref >90)
GFR calc non Af Amer: 9 mL/min — ABNORMAL LOW (ref >90)
Glucose, Bld: 101 mg/dL — ABNORMAL HIGH (ref 70–99)
Potassium: >7.5 mEq/L (ref 3.5–5.1)
Sodium: 127 mEq/L — ABNORMAL LOW (ref 135–145)
Total Bilirubin: 0.5 mg/dL (ref 0.3–1.2)
Total Protein: 7.6 g/dL (ref 6.0–8.3)

## 2013-05-16 LAB — URINALYSIS, ROUTINE W REFLEX MICROSCOPIC
Bilirubin Urine: NEGATIVE
Glucose, UA: NEGATIVE mg/dL
Ketones, ur: NEGATIVE mg/dL
Leukocytes, UA: NEGATIVE
Nitrite: NEGATIVE
Protein, ur: NEGATIVE mg/dL
Specific Gravity, Urine: 1.024 (ref 1.005–1.030)
Urobilinogen, UA: 0.2 mg/dL (ref 0.0–1.0)
pH: 5 (ref 5.0–8.0)

## 2013-05-16 LAB — PROTIME-INR: INR: 1.13 (ref 0.00–1.49)

## 2013-05-16 LAB — BASIC METABOLIC PANEL
BUN: 57 mg/dL — ABNORMAL HIGH (ref 6–23)
CO2: 18 mEq/L — ABNORMAL LOW (ref 19–32)
Calcium: 9.6 mg/dL (ref 8.4–10.5)
Chloride: 100 mEq/L (ref 96–112)
Creatinine, Ser: 4.57 mg/dL — ABNORMAL HIGH (ref 0.50–1.35)
GFR calc Af Amer: 14 mL/min — ABNORMAL LOW (ref >90)
GFR calc Af Amer: 15 mL/min — ABNORMAL LOW (ref >90)
GFR calc non Af Amer: 12 mL/min — ABNORMAL LOW (ref >90)
GFR calc non Af Amer: 13 mL/min — ABNORMAL LOW (ref >90)
Glucose, Bld: 105 mg/dL — ABNORMAL HIGH (ref 70–99)
Glucose, Bld: 115 mg/dL — ABNORMAL HIGH (ref 70–99)
Potassium: 6.4 mEq/L (ref 3.5–5.1)
Sodium: 131 mEq/L — ABNORMAL LOW (ref 135–145)

## 2013-05-16 LAB — POCT I-STAT, CHEM 8
Calcium, Ion: 1.18 mmol/L (ref 1.12–1.23)
Glucose, Bld: 104 mg/dL — ABNORMAL HIGH (ref 70–99)
HCT: 43 % (ref 39.0–52.0)
Hemoglobin: 14.6 g/dL (ref 13.0–17.0)
Sodium: 133 mEq/L — ABNORMAL LOW (ref 135–145)
TCO2: 18 mmol/L (ref 0–100)

## 2013-05-16 LAB — APTT: aPTT: 37 seconds (ref 24–37)

## 2013-05-16 LAB — CREATININE, URINE, RANDOM
Creatinine, Urine: 196.6 mg/dL
Creatinine, Urine: 181.81 mg/dL

## 2013-05-16 LAB — CBC WITH DIFFERENTIAL/PLATELET
Eosinophils Relative: 3 % (ref 0–5)
Hemoglobin: 14.5 g/dL (ref 13.0–17.0)
Lymphocytes Relative: 16 % (ref 12–46)
Lymphs Abs: 1.2 10*3/uL (ref 0.7–4.0)
MCV: 89.4 fL (ref 78.0–100.0)
Monocytes Relative: 10 % (ref 3–12)
Platelets: 685 10*3/uL — ABNORMAL HIGH (ref 150–400)
RBC: 4.8 MIL/uL (ref 4.22–5.81)
WBC: 7.4 10*3/uL (ref 4.0–10.5)

## 2013-05-16 LAB — CBC
HCT: 39.3 % (ref 39.0–52.0)
MCH: 30.6 pg (ref 26.0–34.0)
MCHC: 34.6 g/dL (ref 30.0–36.0)
MCV: 88.5 fL (ref 78.0–100.0)
RDW: 15.2 % (ref 11.5–15.5)
WBC: 8.5 10*3/uL (ref 4.0–10.5)

## 2013-05-16 LAB — SODIUM, URINE, RANDOM
Sodium, Ur: 73 mEq/L
Sodium, Ur: 66 mEq/L

## 2013-05-16 LAB — OSMOLALITY, URINE: Osmolality, Ur: 372 mOsm/kg — ABNORMAL LOW (ref 390–1090)

## 2013-05-16 LAB — LIPASE, BLOOD: Lipase: 28 U/L (ref 11–59)

## 2013-05-16 LAB — MRSA PCR SCREENING: MRSA by PCR: NEGATIVE

## 2013-05-16 LAB — TROPONIN I: Troponin I: <0.3 ng/mL (ref <0.30)

## 2013-05-16 LAB — LACTIC ACID, PLASMA: Lactic Acid, Venous: 1.3 mmol/L (ref 0.5–2.2)

## 2013-05-16 LAB — URINE MICROSCOPIC-ADD ON

## 2013-05-16 LAB — PROTEIN / CREATININE RATIO, URINE: Creatinine, Urine: 176.97 mg/dL

## 2013-05-16 MED ORDER — SODIUM BICARBONATE 8.4 % IV SOLN: 50.0000 meq | Freq: Once | INTRAVENOUS | Status: DC

## 2013-05-16 MED ORDER — SODIUM CHLORIDE 0.9 % IV SOLN
1.0000 g | Freq: Once | INTRAVENOUS | Status: AC
  Administered 2013-05-16: 1 g via INTRAVENOUS
  Filled 2013-05-16: qty 10

## 2013-05-16 MED ORDER — SODIUM CHLORIDE 0.9 % IJ SOLN
3.0000 mL | Freq: Two times a day (BID) | INTRAMUSCULAR | Status: DC
  Administered 2013-05-16 – 2013-05-18 (×4): 3 mL via INTRAVENOUS

## 2013-05-16 MED ORDER — PANTOPRAZOLE SODIUM 40 MG IV SOLR
40.0000 mg | Freq: Every day | INTRAVENOUS | Status: DC
  Administered 2013-05-16 – 2013-05-17 (×2): 40 mg via INTRAVENOUS
  Filled 2013-05-16 (×4): qty 40

## 2013-05-16 MED ORDER — SODIUM CHLORIDE 0.9 % IV SOLN
1.0000 g | Freq: Once | INTRAVENOUS | Status: DC
  Filled 2013-05-16: qty 10

## 2013-05-16 MED ORDER — HEPARIN SODIUM (PORCINE) 5000 UNIT/ML IJ SOLN
5000.0000 [IU] | Freq: Three times a day (TID) | INTRAMUSCULAR | Status: DC
  Administered 2013-05-16 – 2013-05-17 (×2): 5000 [IU] via SUBCUTANEOUS
  Filled 2013-05-16 (×6): qty 1

## 2013-05-16 MED ORDER — INSULIN ASPART 100 UNIT/ML IV SOLN
5.0000 [IU] | Freq: Once | INTRAVENOUS | Status: AC
  Administered 2013-05-16: 5 [IU] via INTRAVENOUS
  Filled 2013-05-16: qty 0.05

## 2013-05-16 MED ORDER — FENTANYL CITRATE 0.05 MG/ML IJ SOLN
INTRAMUSCULAR | Status: AC
  Filled 2013-05-16: qty 2

## 2013-05-16 MED ORDER — ALBUTEROL SULFATE (5 MG/ML) 0.5% IN NEBU
10.0000 mg | INHALATION_SOLUTION | Freq: Once | RESPIRATORY_TRACT | Status: AC
  Administered 2013-05-16: 10 mg via RESPIRATORY_TRACT
  Filled 2013-05-16: qty 2

## 2013-05-16 MED ORDER — HEPARIN SOD (PORK) LOCK FLUSH 100 UNIT/ML IV SOLN
500.0000 [IU] | Freq: Once | INTRAVENOUS | Status: DC
  Filled 2013-05-16: qty 5

## 2013-05-16 MED ORDER — LABETALOL HCL 5 MG/ML IV SOLN
10.0000 mg | INTRAVENOUS | Status: DC | PRN
  Filled 2013-05-16: qty 4

## 2013-05-16 MED ORDER — SODIUM BICARBONATE 8.4 % IV SOLN
50.0000 meq | Freq: Once | INTRAVENOUS | Status: AC
  Administered 2013-05-16: 50 meq via INTRAVENOUS
  Filled 2013-05-16: qty 50

## 2013-05-16 MED ORDER — SODIUM CHLORIDE 0.9 % IV BOLUS (SEPSIS): 2000.0000 mL | Freq: Once | INTRAVENOUS | Status: DC

## 2013-05-16 MED ORDER — DEXTROSE 50 % IV SOLN
1.0000 | Freq: Once | INTRAVENOUS | Status: AC
  Administered 2013-05-16: 50 mL via INTRAVENOUS
  Filled 2013-05-16: qty 50

## 2013-05-16 MED ORDER — SODIUM POLYSTYRENE SULFONATE 15 GM/60ML PO SUSP: 45.0000 g | Freq: Once | ORAL | Status: DC

## 2013-05-16 MED ORDER — DEXTROSE 5 % IV SOLN
INTRAVENOUS | Status: DC
  Administered 2013-05-17: 03:00:00 via INTRAVENOUS
  Filled 2013-05-16 (×6): qty 150

## 2013-05-16 MED ORDER — OXYCODONE-ACETAMINOPHEN 5-325 MG PO TABS
1.0000 | ORAL_TABLET | Freq: Four times a day (QID) | ORAL | Status: DC | PRN
  Administered 2013-05-16: 2 via ORAL
  Administered 2013-05-17: 1 via ORAL
  Filled 2013-05-16: qty 1
  Filled 2013-05-16: qty 2

## 2013-05-16 MED ORDER — FENTANYL CITRATE 0.05 MG/ML IJ SOLN
25.0000 ug | Freq: Once | INTRAMUSCULAR | Status: AC
  Administered 2013-05-16: 50 ug via INTRAVENOUS

## 2013-05-16 MED ORDER — ACETAMINOPHEN 325 MG PO TABS
650.0000 mg | ORAL_TABLET | Freq: Four times a day (QID) | ORAL | Status: DC | PRN
  Administered 2013-05-17: 650 mg via ORAL
  Filled 2013-05-16: qty 2

## 2013-05-16 MED ORDER — SODIUM CHLORIDE 0.9 % IV SOLN: 250.0000 mL | INTRAVENOUS | Status: DC | PRN

## 2013-05-16 MED ORDER — SODIUM CHLORIDE 0.9 % IV BOLUS (SEPSIS)
1000.0000 mL | Freq: Once | INTRAVENOUS | Status: AC
  Administered 2013-05-16: 1000 mL via INTRAVENOUS

## 2013-05-16 MED ORDER — ACETAMINOPHEN 650 MG RE SUPP: 650.0000 mg | Freq: Four times a day (QID) | RECTAL | Status: DC | PRN

## 2013-05-16 NOTE — ED Notes (Signed)
carelink in route

## 2013-05-16 NOTE — Procedures (Signed)
Central Venous Catheter Insertion Procedure Note Rick Edwards [id number] 02/26/1956  Procedure: Insertion of Central Venous Catheter Indications: Dialysis  Procedure Details Consent: Risks of procedure as well as the alternatives and risks of each were explained to the (patient/caregiver).  Consent for procedure obtained. Time Out: Verified patient identification, verified procedure, site/side was marked, verified correct patient position, special equipment/implants available, medications/allergies/relevent history reviewed, required imaging and test results available.  Performed  Maximum sterile technique was used including antiseptics, cap, gloves, gown, hand hygiene, mask and sheet. Skin prep: Chlorhexidine; local anesthetic administered A antimicrobial bonded/coated triple lumen dialysis catheter was placed in the right internal jugular vein using the Seldinger technique and US guidance.  Evaluation Blood flow good Complications: No apparent complications Patient did tolerate procedure well. Chest X-ray ordered to verify placement.  CXR: pending.  Procedure directly supervised by Dr. Gillermina Phy.  Rick Morton, MD PGY-2, [facility] 05/16/2013, 10:44 PM

## 2013-05-16 NOTE — Progress Notes (Signed)
Pt now in MICU.  Rec NaHCO3 gtt currently at 125/hr.  Awake, alert.  Nonfocal.  No tremor.  Has mild tachycardia.     I suspect pt AKI developed from contrast nephropathy from CT w/ contrast 10/02 and compounded by hypovolemia (+N/V, diarrhea) on ARB and supplemental KCl.  Renal function and K were normal on 05/07/13.  Pt making some urine.  I&O cath with negligible UOP.  Repeat BMP after medical mgmt with K 7.2, BUN 57, HCO3 18.  Given refractory hyperkalemia will proced with hemodialysis tonight after HD catheter placed.  Pt updated and agrees with plan.  [name] Heck, MD

## 2013-05-16 NOTE — ED Notes (Addendum)
Per ems pt is from home. Pt unsure of name of surgery, believes he had a colostomy reversal. Now have wound drainage system in place in abdomen. Pt reports he had an infection at site in abdomen and sound wound vac was place to draw out infection. Pt c/o dizziness, weakness, and nausea.  Reports abdominal pain 7/10. Reports the distension is the same as from surgery time.  Pt is on coumadin. Denies bleeding at present, should be having levels checked soon.   Reports he went to pcp yesterday, told pcp all his symptoms. Reports pcp did nothing.

## 2013-05-16 NOTE — ED Notes (Signed)
Bed: WA03 Expected date:  Expected time:  Means of arrival:  Comments: ems- colostomy reversal last week, now dizziness/n/v

## 2013-05-16 NOTE — Consult Note (Signed)
Renal Service Consult Note [facility]  Yuan Gann [facility] 05/16/2013 Maree Krabbe Requesting Physician:  ER physician at [facility] ED  Reason for Consult:  AKI and hyperkalemia HPI: The patient is a 57 y.o. year-old with hx of sever Crohn's, AT III deficiency, HTN and HOH since childhoold.  Pt have Crohn's colitis with obstruction and underwent subtotal colectomy with ileostomy and enterotomy repair on 03/11/12.  He was admitted recently for takedown of ostomy which was done on 04/18/13.  The wound is open and he was d/c'd 1 week ago to home with wound vac in place and normal renal function.   Pt returns to ED today reporting nausea and vomiting, orthostatic lightheadedness, +abd pain. Found to have ^Cr at 6.11 and K > 7.5.  Last week SCr was 0.76 and K 3.3.  He takes combination ARB/CCB/thiazide at home and has been taking this, as well as KCL 20/d.  In ED BP was 118/56, EKG showed some peaked T waves, normal QRS duration.  He has rec'd IV Ca, insulin, glucose, NaHCO3 and albuterol nebs.    Pt is poor historian , family provides fair hx    ROS  no abd pain now  no sob or cough  no cp  no NSAID"S  Past Medical History  Past Medical History  Diagnosis Date  . Crohn's disease     tx. Imuran  . Perianal abscess 2001    s/p right hemicolectomy, and drainage of retroperitoneal abcess 2003  . Hypertension   . Hearing loss     wears hearing aid left ear; birthed with hearing loss  . GI bleed 6/11    w/ normal EGD 6/11, 3 PRBCs   . Anemia     anemia thrombocytopenia, saw Hematology 2011, can not r/o myeloproliferative d/c  . Transfusion history     last 8'13  . Ileostomy in place 04-11-13    04-18-13 ileostomy to be taken down.  Marland Kitchen HOH (hard of hearing) 05/16/2013  . Antithrombin III deficiency 05/16/2013    On coumadin for this   Past Surgical History  Past Surgical History  Procedure Laterality Date  . Hemicolectomy  08/29/2001    perforated abscess  . Anal  fistulotomy  2002  . Partial colectomy  03/11/2012    Procedure: PARTIAL COLECTOMY;  Surgeon: Mariella Saa, MD;  Location: [facility]S;  Service: General;  Laterality: N/A;  subtotal colectomy transverse and left colon   . Embolectomy  03/12/2012    Procedure: EMBOLECTOMY;  Surgeon: Chuck Hint, MD;  Location: [facility] OR;  Service: Vascular;  Laterality: Right;  Right popliteal embolectomy with vein patch angioplasty, right posterior tibial embolectomy with vein patch angioplasty   . Intraoperative arteriogram  03/12/2012    Procedure: INTRA OPERATIVE ARTERIOGRAM;  Surgeon: Chuck Hint, MD;  Location: [facility] OR;  Service: Vascular;  Laterality: Right;  . Transmetatarsal amputation  05/10/2012    right  . Ileostomy closure N/A 04/18/2013    Procedure: ILEOSTOMY TAKEDOWN;  Surgeon: Mariella Saa, MD;  Location: [facility] ORS;  Service: General;  Laterality: N/A;   Family History  Family History  Problem Relation Age of Onset  . Coronary artery disease Neg Hx   . Diabetes Neg Hx   . Colon cancer Neg Hx   . Prostate cancer Neg Hx   . Stroke      GF, aunts   . Hypertension      "the whole family"  . Hypertension Mother   . Hyperlipidemia Mother   .  Hypertension Father   . Hyperlipidemia Father   . Heart disease Father   . Kidney failure Neg Hx    Social History  reports that he quit smoking about 2 years ago. His smoking use included Cigarettes. He has a 2.25 pack-year smoking history. He has never used smokeless tobacco. He reports that he does not drink alcohol or use illicit drugs. Allergies  Allergies  Allergen Reactions  . Mesalamine     REACTION: Rash   Home medications Prior to Admission medications   Medication Sig Start Date End Date Taking? Authorizing Provider  amoxicillin-clavulanate (AUGMENTIN) 875-125 MG per tablet Take 1 tablet by mouth 2 (two) times daily. 05/09/13  Yes Mariella Saa, MD  azaTHIOprine (IMURAN) 50 MG tablet Take 75 mg by mouth daily.    Yes Historical Provider, MD  Olmesartan-Amlodipine-HCTZ Marya Landry) 20-5-12.5 MG TABS Take 1 tablet by mouth daily after breakfast. 01/29/13  Yes Etta Grandchild, MD  oxyCODONE-acetaminophen (PERCOCET/ROXICET) 5-325 MG per tablet Take 1-2 tablets by mouth every 6 (six) hours as needed. 05/13/13  Yes Mariella Saa, MD  potassium chloride SA (K-DUR,KLOR-CON) 20 MEQ tablet Take 1 tablet (20 mEq total) by mouth daily. 05/09/13  Yes Mariella Saa, MD  warfarin (COUMADIN) 5 MG tablet Take 5 mg by mouth daily. Takes 2 tablet on Wednesday and Saturday and 1.5 tablets the rest of the week   Yes Historical Provider, MD   Liver Function Tests  Recent Labs Lab 05/16/13 1325 05/16/13 1500  AST HEMOLYZED SPECIMEN, RESULTS MAY BE AFFECTED 22  ALT HEMOLYZED SPECIMEN, RESULTS MAY BE AFFECTED 56*  ALKPHOS HEMOLYZED SPECIMEN, RESULTS MAY BE AFFECTED 135*  BILITOT HEMOLYZED SPECIMEN, RESULTS MAY BE AFFECTED 0.5  PROT HEMOLYZED SPECIMEN, RESULTS MAY BE AFFECTED 7.6  ALBUMIN HEMOLYZED SPECIMEN, RESULTS MAY BE AFFECTED 3.5    Recent Labs Lab 05/16/13 1325 05/16/13 1500  LIPASE HEMOLYZED SPECIMEN, RESULTS MAY BE AFFECTED 28   CBC  Recent Labs Lab 05/16/13 1325  WBC 7.4  NEUTROABS 5.2  HGB 14.5  HCT 42.9  MCV 89.4  PLT 685*   Basic Metabolic Panel  Recent Labs Lab 05/16/13 1325 05/16/13 1500  NA HEMOLYZED SPECIMEN, RESULTS MAY BE AFFECTED 127*  K HEMOLYZED SPECIMEN, RESULTS MAY BE AFFECTED >7.5*  CL HEMOLYZED SPECIMEN, RESULTS MAY BE AFFECTED 99  CO2 HEMOLYZED SPECIMEN, RESULTS MAY BE AFFECTED 15*  GLUCOSE HEMOLYZED SPECIMEN, RESULTS MAY BE AFFECTED 101*  BUN HEMOLYZED SPECIMEN, RESULTS MAY BE AFFECTED 65*  CREATININE HEMOLYZED SPECIMEN, RESULTS MAY BE AFFECTED 6.11*  CALCIUM HEMOLYZED SPECIMEN, RESULTS MAY BE AFFECTED 9.3    Physical Exam  Blood pressure 118/56, pulse 86, temperature 98.7 F (37.1 C), temperature source Oral, resp. rate 16, SpO2 96.00%. Gen: chronically ill  appearing, calm, a bit cachectic, no distress , getting neb Rx Skin: no rash, cyanosis HEENT:  EOMI, sclera anicteric, throat clear and moist Neck: flat neck veins, no JVD Chest: clear throughout to bases CV: regular, no rub or M, pedal pulses intact Abdomen: soft, distended, midline dressing not removed, nontender, +BS Ext: no LE or UE edema , no joint effusion, no gangrene/ulcers; R foot TMA well healed Neuro: alert, Ox3, nonfocal, no asterixis, HOH,    Assessment: 1. Acute kidney injury, suspect hemodynamic or ATN from vol depletion and ARB/diuretic. Could have AIN from augmentin 2. Hyperkalemia- severe, with minimal EKG changes, was on ARB and KCl. Has been treated in ED with usual K -lowering meds 3. S/p ileostomy takedown 9/12, open wound w vac  in place 4. Vol depletion 5. Severe Crohn's dz 6. AT III deficiency on coumadin, INR 1.13 today 7. Hx R TMA  Plan- Give kayexalate now and repeat bmet, admit to ICU at [facility], give bolus saline IVF"s x 2-3 liters then 150 /hr, foley placement, repeat bmet again on arrival to [facility].  May or may not need HD depending on response to above. Dr Marisue Humble is on for renal svc tonight and is aware of patient's condition  Vinson Moselle  MD Pager [phone]    Cell  [phone] 05/16/2013, 4:49 PM

## 2013-05-16 NOTE — Consult Note (Signed)
General Surgery [facility]  Patient of Dr. Jaclynn Guarneri who is unavailable today.  Patient seen and examined in ER with [name].  No acute surgical issues identified.  Appreciate medical service evaluation.  Will follow.  Agree with [name] note as attached.  Velora Heckler, MD, [facility] Office: [phone]

## 2013-05-16 NOTE — ED Notes (Addendum)
Attempted to give report to [facility] [id number], was transferred to the charge nurse who would not take report.

## 2013-05-16 NOTE — ED Provider Notes (Signed)
CSN: [id number]     Arrival date & time 05/16/13  1106 History   First MD Initiated Contact with Patient 05/16/13 1136     Chief Complaint  Patient presents with  . thinks surgical site infected    (Consider location/radiation/quality/duration/timing/severity/associated sxs/prior Treatment) Patient is a 57 y.o. male presenting with general illness.  Illness Quality:  Generalized weakness, presyncope, continued abdominal pain.   Onset quality:  Gradual Timing:  Constant Progression:  Worsening Chronicity:  New Context:  Hx of recent colostomy reversal Relieved by:  Nothing Worsened by:  Nothing Associated symptoms: fever (subjective chills and sweats)   Associated symptoms: no abdominal pain, no chest pain, no congestion, no cough, no diarrhea, no nausea, no shortness of breath and no vomiting     Past Medical History  Diagnosis Date  . Crohn's disease     tx. Imuran  . Perianal abscess 2001    s/p right hemicolectomy, and drainage of retroperitoneal abcess 2003  . Hypertension   . Hearing loss     wears hearing aid left ear; birthed with hearing loss  . GI bleed 6/11    w/ normal EGD 6/11, 3 PRBCs   . Anemia     anemia thrombocytopenia, saw Hematology 2011, can not r/o myeloproliferative d/c  . Transfusion history     last 8'13  . Ileostomy in place 04-11-13    04-18-13 ileostomy to be taken down.   Past Surgical History  Procedure Laterality Date  . Hemicolectomy  08/29/2001    perforated abscess  . Anal fistulotomy  2002  . Partial colectomy  03/11/2012    Procedure: PARTIAL COLECTOMY;  Surgeon: Mariella Saa, MD;  Location: [facility];  Service: General;  Laterality: N/A;  subtotal colectomy transverse and left colon   . Embolectomy  03/12/2012    Procedure: EMBOLECTOMY;  Surgeon: Chuck Hint, MD;  Location: [facility] OR;  Service: Vascular;  Laterality: Right;  Right popliteal embolectomy with vein patch angioplasty, right posterior tibial embolectomy with vein patch  angioplasty   . Intraoperative arteriogram  03/12/2012    Procedure: INTRA OPERATIVE ARTERIOGRAM;  Surgeon: Chuck Hint, MD;  Location: [facility] OR;  Service: Vascular;  Laterality: Right;  . Transmetatarsal amputation  05/10/2012    right  . Ileostomy closure N/A 04/18/2013    Procedure: ILEOSTOMY TAKEDOWN;  Surgeon: Mariella Saa, MD;  Location: [facility];  Service: General;  Laterality: N/A;   Family History  Problem Relation Age of Onset  . Coronary artery disease Neg Hx   . Diabetes Neg Hx   . Colon cancer Neg Hx   . Prostate cancer Neg Hx   . Stroke      GF, aunts   . Hypertension      "the whole family"  . Hypertension Mother   . Hyperlipidemia Mother   . Hypertension Father   . Hyperlipidemia Father   . Heart disease Father    History  Substance Use Topics  . Smoking status: Former Smoker -- 0.25 packs/day for 9 years    Types: Cigarettes    Quit date: 05/09/2011  . Smokeless tobacco: Never Used  . Alcohol Use: No    Review of Systems  Constitutional: Positive for fever (subjective chills and sweats).  HENT: Negative for congestion.   Respiratory: Negative for cough and shortness of breath.   Cardiovascular: Negative for chest pain.  Gastrointestinal: Negative for nausea, vomiting, abdominal pain and diarrhea.  All other systems reviewed and are negative.  Allergies  Mesalamine  Home Medications   Current Outpatient Rx  Name  Route  Sig  Dispense  Refill  . amoxicillin-clavulanate (AUGMENTIN) 875-125 MG per tablet   Oral   Take 1 tablet by mouth 2 (two) times daily.   14 tablet   3   . Olmesartan-Amlodipine-HCTZ (TRIBENZOR) 20-5-12.5 MG TABS   Oral   Take 1 tablet by mouth daily after breakfast.   30 tablet   5   . oxyCODONE-acetaminophen (PERCOCET/ROXICET) 5-325 MG per tablet   Oral   Take 1-2 tablets by mouth every 6 (six) hours as needed.   50 tablet   0   . potassium chloride SA (K-DUR,KLOR-CON) 20 MEQ tablet   Oral   Take 1  tablet (20 mEq total) by mouth daily.   20 tablet   0   . warfarin (COUMADIN) 5 MG tablet   Oral   Take 5 mg by mouth daily. Takes 1 tablet on Wednesday and Saturday and 1.5 tablets the rest of the week          BP 117/69  Pulse 91  Temp(Src) 98.3 F (36.8 C) (Oral)  Resp 16  SpO2 96% Physical Exam  Nursing note and vitals reviewed. Constitutional: He is oriented to person, place, and time. He appears well-developed and well-nourished. No distress.  HENT:  Head: Normocephalic and atraumatic.  Mouth/Throat: Oropharynx is clear and moist.  Eyes: Conjunctivae are normal. Pupils are equal, round, and reactive to light. No scleral icterus.  Neck: Neck supple.  Cardiovascular: Normal rate, regular rhythm, normal heart sounds and intact distal pulses.   No murmur heard. Pulmonary/Chest: Effort normal and breath sounds normal. No stridor. No respiratory distress. He has no wheezes. He has no rales.  Abdominal: Soft. He exhibits no distension. There is tenderness.  Midline wound with wound vac in place.    Musculoskeletal: Normal range of motion. He exhibits no edema.  Neurological: He is alert and oriented to person, place, and time.  Skin: Skin is warm and dry. No rash noted.  Psychiatric: He has a normal mood and affect. His behavior is normal.    ED Course  CRITICAL CARE Performed by: Blake Divine DAVID Authorized by: Blake Divine DAVID Total critical care time: 40 minutes Critical care time was exclusive of separately billable procedures and treating other patients. Critical care was necessary to treat or prevent imminent or life-threatening deterioration of the following conditions: metabolic crisis and renal failure. Critical care was time spent personally by me on the following activities: development of treatment plan with patient or surrogate, discussions with consultants, evaluation of patient's response to treatment, examination of patient, obtaining history from  patient or surrogate, ordering and performing treatments and interventions, ordering and review of laboratory studies, ordering and review of radiographic studies, pulse oximetry, re-evaluation of patient's condition and review of old charts.   (including critical care time) Labs Review Labs Reviewed  CBC WITH DIFFERENTIAL - Abnormal; Notable for the following:    Platelets 685 (*)    All other components within normal limits  COMPREHENSIVE METABOLIC PANEL - Abnormal; Notable for the following:    Sodium 127 (*)    Potassium >7.5 (*)    CO2 15 (*)    Glucose, Bld 101 (*)    BUN 65 (*)    Creatinine, Ser 6.11 (*)    ALT 56 (*)    Alkaline Phosphatase 135 (*)    GFR calc non Af Amer 9 (*)    GFR  calc Af Amer 11 (*)    All other components within normal limits  GLUCOSE, CAPILLARY - Abnormal; Notable for the following:    Glucose-Capillary 102 (*)    All other components within normal limits  CLOSTRIDIUM DIFFICILE BY PCR  URINE CULTURE  COMPREHENSIVE METABOLIC PANEL  LIPASE, BLOOD  LACTIC ACID, PLASMA  PROTIME-INR  APTT  LIPASE, BLOOD  TROPONIN I  TROPONIN I  TROPONIN I  URINALYSIS, ROUTINE W REFLEX MICROSCOPIC  SODIUM, URINE, RANDOM  CREATININE, URINE, RANDOM  PROTEIN / CREATININE RATIO, URINE  OSMOLALITY  OSMOLALITY, URINE  BASIC METABOLIC PANEL   Imaging Review No results found.  EKG Interpretation   None     EKG - NSR, rate 88, Left axis, QRSd 120, QTc 415, nonspecific ST/T changes, which are new compared to prior.   MDM   1. Acute hyperkalemia   2. Acute renal failure    57 yo male with hx of Crohn's Disease with recent colostomy reversal presenting with several days of weakness, decreased intake, and several episodes of near syncope.  He was found to be in acute renal failure.  He had EKG changes consistent with hyperkalemia.  He was given IV insulin, bicarb, calcium, dextrose, inhaled albuterol.  I discussed his case with General Surgery, Internal Medicine,  and Nephrology.  Ultimate plan was for admission to ICU at [facility].      Candyce Churn, MD 05/16/13 [phone]

## 2013-05-16 NOTE — ED Notes (Signed)
[place], EDP made aware of i-stat results.

## 2013-05-16 NOTE — ED Notes (Signed)
RN made aware of patient i-stat result and will page doctor to notify.

## 2013-05-16 NOTE — Consult Note (Signed)
Rick Edwards is an 57 y.o. male.   Chief Complaint: Nausea, dry heaves, weakness HPI: 57 y/o with history Crohn's disease who has had right hemicolectomy and drainage of abscess back in 2003, and Total abdominal colectomy with ileostomy EMBOLECTOMY, INTRA OPERATIVE ARTERIOGRAM, 03/11/2012.  He also had right foot ischemia and splenic  infarction related to a previously unknown antithrombin III deficiency.  He return in August for ileostomy take down.  This was complicated by by wound infection and  intraabdominal abscess that could not be drained, there was no leak from the anastomaosis.  CT scan 9/26 showed a 6 x 3.6 cm fluid collection with no good window for drainage.  The follow up CT on 10/1 showed the fluid collection was much better down to 2.1 x 3.7 cm  He was discharged home on antibiotics and was seen in the office yesterday by Dr. Johna Sheriff.  He complains of a lot of pain but it seems to be incisional pain and related mostly to his VAC dressing when it is changed or when the suction comes on. He denies deep abdominal pain. He states he has felt a little feverish at times but has not taken his temperature. He is a week and when he gets up to walk he quickly becomes winded. He is actually tolerating his full liquid diet with no difficulty and is having several semi-formed bowel movements per day.  He is now back in the ER complaining of weakness, SOB walking in the house, increased weakness, allot of loose stools.  He has nausea and dry heaves, his appetite is very poor and family says he  isn't eating much. Labs ordered yesterday by Dr. Johna Sheriff, for today were not done because he seemed to be so sick.  He is diaphoretic with some orthostatic hypotension, but not much.  Tachycardic and somewhat dyspneic.  Preliminary labs are now listed as hemolyized, but before that the creatinine was up to 6.7.  No K+, EKG showed sinus tachycardia with peaked T wave.  Lactic acid 1.3.  INR 1.13 Medicine is being  contacted and ask to admit.      Past Medical History  Diagnosis Date  . Crohn's disease     tx. Imuran  . Perianal abscess 2001    s/p right hemicolectomy, and drainage of retroperitoneal abcess 2003  . Hypertension   . Hearing loss     wears hearing aid left ear; birthed with hearing loss  . GI bleed 6/11    w/ normal EGD 6/11, 3 PRBCs   . Anemia     anemia thrombocytopenia, saw Hematology 2011, can not r/o myeloproliferative d/c  . Transfusion history     last 8'13  . Ileostomy in place 04-11-13    04-18-13 ileostomy to be taken down.  Marland Kitchen HOH (hard of hearing) 05/16/2013  . Antithrombin III deficiency 05/16/2013    On coumadin for this   Past Surgical History  Procedure Laterality Date  . Hemicolectomy  08/29/2001    perforated abscess  . Anal fistulotomy  2002  . Partial colectomy  03/11/2012    Procedure: PARTIAL COLECTOMY;  Surgeon: Mariella Saa, MD;  Location: [facility];  Service: General;  Laterality: N/A;  subtotal colectomy transverse and left colon   . Embolectomy  03/12/2012    Procedure: EMBOLECTOMY;  Surgeon: Chuck Hint, MD;  Location: [facility] OR;  Service: Vascular;  Laterality: Right;  Right popliteal embolectomy with vein patch angioplasty, right posterior tibial embolectomy with vein patch angioplasty   .  Intraoperative arteriogram  03/12/2012    Procedure: INTRA OPERATIVE ARTERIOGRAM;  Surgeon: Chuck Hint, MD;  Location: [facility] OR;  Service: Vascular;  Laterality: Right;  . Transmetatarsal amputation  05/10/2012    right  . Ileostomy closure N/A 04/18/2013    Procedure: ILEOSTOMY TAKEDOWN;  Surgeon: Mariella Saa, MD;  Location: [facility];  Service: General;  Laterality: N/A;    Family History  Problem Relation Age of Onset  . Coronary artery disease Neg Hx   . Diabetes Neg Hx   . Colon cancer Neg Hx   . Prostate cancer Neg Hx   . Stroke      GF, aunts   . Hypertension      "the whole family"  . Hypertension Mother   . Hyperlipidemia  Mother   . Hypertension Father   . Hyperlipidemia Father   . Heart disease Father    Social History:  reports that he quit smoking about 2 years ago. His smoking use included Cigarettes. He has a 2.25 pack-year smoking history. He has never used smokeless tobacco. He reports that he does not drink alcohol or use illicit drugs. Prior to Admission medications   Medication Sig Start Date End Date Taking? Authorizing Provider  amoxicillin-clavulanate (AUGMENTIN) 875-125 MG per tablet Take 1 tablet by mouth 2 (two) times daily. 05/09/13  Yes Mariella Saa, MD  azaTHIOprine (IMURAN) 50 MG tablet Take 75 mg by mouth daily.   Yes Historical Provider, MD  Olmesartan-Amlodipine-HCTZ Marya Landry) 20-5-12.5 MG TABS Take 1 tablet by mouth daily after breakfast. 01/29/13  Yes Etta Grandchild, MD  oxyCODONE-acetaminophen (PERCOCET/ROXICET) 5-325 MG per tablet Take 1-2 tablets by mouth every 6 (six) hours as needed. 05/13/13  Yes Mariella Saa, MD  potassium chloride SA (K-DUR,KLOR-CON) 20 MEQ tablet Take 1 tablet (20 mEq total) by mouth daily. 05/09/13  Yes Mariella Saa, MD  warfarin (COUMADIN) 5 MG tablet Take 5 mg by mouth daily. Takes 2 tablet on Wednesday and Saturday and 1.5 tablets the rest of the week   Yes Historical Provider, MD    Allergies:  Allergies  Allergen Reactions  . Mesalamine     REACTION: Rash       No results found for this or any previous visit (from the past 48 hour(s)). No results found.  Review of Systems  Constitutional: Positive for fever (he's not sure), postive for  chills (says he's cold at home.), weight loss (not sure) and diaphoresis.  HENT: Positive for hearing loss (chronic hearing loss, family signs some to him) and sore throat. Negative for congestion, ear discharge, ear pain, nosebleeds and tinnitus.   Eyes: Negative.   Respiratory: Positive for shortness of breath (walking in house he feels sob). Negative for cough, hemoptysis, sputum production,  wheezing and stridor.   Cardiovascular: Negative.   Gastrointestinal: Positive for heartburn, nausea, vomiting and diarrhea (chronic loose stools.).  Genitourinary: Negative.   Musculoskeletal: Positive for back pain and joint pain. Negative for falls.  Skin: Negative.   Neurological: Positive for dizziness (some ) and weakness (he says walking to BR he gets SOB, and weak). Negative for tingling, tremors, sensory change, speech change, focal weakness, seizures and loss of consciousness.  Endo/Heme/Allergies: Negative for environmental allergies and polydipsia. Bruises/bleeds easily.  Psychiatric/Behavioral: The patient is nervous/anxious (chronic).     Blood pressure 117/69, pulse 91, temperature 98.3 F (36.8 C), temperature source Oral, resp. rate 16, SpO2 96.00%. Physical Exam  Constitutional: He is oriented to person, place, and  time. No distress.  Very anxious male somewhat diaphoretic BP supine 114/65  HR 92 Sitting up 100/70  HR up to 120,  Dropping sats but he is a bit sweaty, may not be tracking well.  HENT:  Head: Normocephalic and atraumatic.  Nose: Nose normal.  Eyes: Conjunctivae and EOM are normal. Pupils are equal, round, and reactive to light. Right eye exhibits no discharge. Left eye exhibits no discharge. No scleral icterus.  Neck: Normal range of motion. Neck supple. No JVD present. No tracheal deviation present. No thyromegaly present.  Cardiovascular: Regular rhythm, normal heart sounds and intact distal pulses.  Exam reveals no gallop.   No murmur heard. Tachycardic at rest  Respiratory: Breath sounds normal. No respiratory distress. He has no wheezes. He has no rales. He exhibits no tenderness.  RR up in the 20's at rest  GI: He exhibits distension (he is distended). He exhibits no mass. There is tenderness (more sore than tender.). There is rebound. There is no guarding.  BS hyperactive  Open wound with wound vac and it looks very good.  Some suture is still  seen and a coupe staples are still at the base.   Colostomy site looks fine.  Musculoskeletal: Normal range of motion. He exhibits no edema and no tenderness.  transmetatarsal foot amputation, well healed.  Lymphadenopathy:    He has no cervical adenopathy.  Neurological: He is alert and oriented to person, place, and time. No cranial nerve deficit.  Skin: No rash noted. He is diaphoretic. No erythema. No pallor.  He just feels cool and a bit clammy  Psychiatric: He has a normal mood and affect. His behavior is normal. Judgment and thought content normal.  Very anxious, and removing adhesive portion of wound vac is very painful for him.     Assessment/Plan 1. Acute Renal failure with initial creatinine 6.7. 2. S/p colostomy take down 04/18/13, post op wound infection now on wound vac.  Intraabdominal fluid collection/ abscess improving on Antibiotics. 3.Severe Crohn's colitis obstruction and Subtotal colectomy with end ileostomy/enterotomy repair, 03/11/12. 4.  S/p right hemicolectomy about 2003. 5. Hx of Perianal abscess 6.  Antithrombin III deficiency, with splenic infarct/right foot ischemia 03/2012.  On chronic coumadin now. 7. Hypertension 8.  Hx of GI bleed 9.  HOH, since childhood  Plan:  Medicine will admit and we will follow.  I am going to do wet to dry dressing for a couple days and give him some rest from the wound vac.  We will follow with you.  CBC shows a normal WBC and lactic acid.  Mance Vallejo 05/16/2013, 1:45 PM

## 2013-05-16 NOTE — H&P (Signed)
PULMONARY  / CRITICAL CARE MEDICINE  Name: Rick Edwards MRN: [id number] DOB: [birth year] PCP Sanda Linger, MD    ADMISSION DATE:  05/16/2013 CONSULTATION DATE:  05/16/2013   REFERRING MD :  Owens Loffler [facility] PRIMARY SERVICE: [facility] -> PCCM  CHIEF COMPLAINT:  Acute renal failure  HPI Patient is a 57 y.o. year-old male with history of Crohn's and antithrombin III deficiency with complicated past medical history who presents with weakness, nausea, and hiccoups. He underwent hemicolectomy in 2001 due to a retroperitoneal abscess which remained stable for many years. In August of 2013, however, he had total abdominal colectomy and ileostomy due to a partial obstruction and significant weight loss. In September 2014, he underwent an ileostomy take down. His course was complicated by the development of an intraabdominal abscess. Because the area would be difficult to drain, he was treated with antibiotics only and he had partial resolution on follow up CT. Because his abdominal pain improved, his white count and fever trended down, he was discharged to home on 05/09/2013 on Augmentin and with a wound vac. He was told to continue his warfarin, however, he has not resumed this medication. He states that prior to discharge, he had been making very little urine, however, he was feeling well. His BUN was 6 and his creatinine was 0.76 on 10/1. He was sent home with his mother and father who have cared for him this week. He has had progressive weakness. Previously he could walk, but he has been very fatigued with getting up to the bathroom and feels lightheaded with exertion. He developed nausea with 2 episodes of nonbilious and nonbloody vomiting last night and severe hiccoughs. He has had persistent abdominal pain. His parents brought him to the ER for further evaluation. He has not been taking his azathioprine or warfarin, but has been taking his other prescribed medications including his ARB and  potassium. He has not been taking any NSAIDS. He has been taking his antibiotic. He states he has been eating and drinking some and has felt dehydrated. Despite not making much urine, he has had watery stools about 5 times daily.   In the ER, initial CMP is hemolyzed, but concerning for potassium > 9, BUN 67, creatinine 6.7, bicarb 15. CBC notable only for plt of 685. Lactic acid 1.3. ECG demonstrates NSR, peaked T-waves, QRS . In the ER, he was given calcium gluconate, dextrose with insulin, sodium bicarb once, albuterol neb, and a 1L NS bolus. Repeat CMP is pending at the time of admission call. General surgery has been consulted and nephrology was notified by the ER physician.   Initially triaged to [facility] ICU but due to persistent acidosis and high K, PCCM admitted [facility] for [facility]. REceived 1-2L fluids in [facility] ICU and making urine at time of this eval   SIGNIFICANT EVENTS / STUDIES:  Admit 05/16/2013   LINES / TUBES: HD Cath - planned 05/16/2013   CULTURES: none  ANTIBIOTICS: Anti-infectives   None         PAST MEDICAL HISTORY :  Past Medical History  Diagnosis Date  . Crohn's disease     tx. Imuran  . Perianal abscess 2001    s/p right hemicolectomy, and drainage of retroperitoneal abcess 2003  . Hypertension   . Hearing loss     wears hearing aid left ear; birthed with hearing loss  . GI bleed 6/11    w/ normal EGD 6/11, 3 PRBCs   .  Anemia     anemia thrombocytopenia, saw Hematology 2011, can not r/o myeloproliferative d/c  . Transfusion history     last 8'13  . Ileostomy in place 04-11-13    04-18-13 ileostomy to be taken down.  Marland Kitchen HOH (hard of hearing) 05/16/2013  . Antithrombin III deficiency 05/16/2013    On coumadin for this   Past Surgical History  Procedure Laterality Date  . Hemicolectomy  08/29/2001    perforated abscess  . Anal fistulotomy  2002  . Partial colectomy  03/11/2012    Procedure: PARTIAL COLECTOMY;  Surgeon: Mariella Saa, MD;  Location: [facility];  Service: General;  Laterality: N/A;  subtotal colectomy transverse and left colon   . Embolectomy  03/12/2012    Procedure: EMBOLECTOMY;  Surgeon: Chuck Hint, MD;  Location: [facility] OR;  Service: Vascular;  Laterality: Right;  Right popliteal embolectomy with vein patch angioplasty, right posterior tibial embolectomy with vein patch angioplasty   . Intraoperative arteriogram  03/12/2012    Procedure: INTRA OPERATIVE ARTERIOGRAM;  Surgeon: Chuck Hint, MD;  Location: [facility] OR;  Service: Vascular;  Laterality: Right;  . Transmetatarsal amputation  05/10/2012    right  . Ileostomy closure N/A 04/18/2013    Procedure: ILEOSTOMY TAKEDOWN;  Surgeon: Mariella Saa, MD;  Location: [facility];  Service: General;  Laterality: N/A;   Prior to Admission medications   Medication Sig Start Date End Date Taking? Authorizing Provider  amoxicillin-clavulanate (AUGMENTIN) 875-125 MG per tablet Take 1 tablet by mouth 2 (two) times daily. 05/09/13  Yes Mariella Saa, MD  azaTHIOprine (IMURAN) 50 MG tablet Take 75 mg by mouth daily.   Yes Historical Provider, MD  Olmesartan-Amlodipine-HCTZ Marya Landry) 20-5-12.5 MG TABS Take 1 tablet by mouth daily after breakfast. 01/29/13  Yes Etta Grandchild, MD  oxyCODONE-acetaminophen (PERCOCET/ROXICET) 5-325 MG per tablet Take 1-2 tablets by mouth every 6 (six) hours as needed. 05/13/13  Yes Mariella Saa, MD  potassium chloride SA (K-DUR,KLOR-CON) 20 MEQ tablet Take 1 tablet (20 mEq total) by mouth daily. 05/09/13  Yes Mariella Saa, MD  warfarin (COUMADIN) 5 MG tablet Take 5 mg by mouth daily. Takes 2 tablet on Wednesday and Saturday and 1.5 tablets the rest of the week   Yes Historical Provider, MD   Allergies  Allergen Reactions  . Mesalamine     REACTION: Rash    FAMILY HISTORY:  Family History  Problem Relation Age of Onset  . Coronary artery disease Neg Hx   . Diabetes Neg Hx   . Colon cancer Neg Hx   .  Prostate cancer Neg Hx   . Stroke      GF, aunts   . Hypertension      "the whole family"  . Hypertension Mother   . Hyperlipidemia Mother   . Hypertension Father   . Hyperlipidemia Father   . Heart disease Father   . Kidney failure Neg Hx    SOCIAL HISTORY:  reports that he quit smoking about 2 years ago. His smoking use included Cigarettes. He has a 2.25 pack-year smoking history. He has never used smokeless tobacco. He reports that he does not drink alcohol or use illicit drugs.  REVIEW OF SYSTEMS:  Per hpi  SUBJECTIVE:   VITAL SIGNS: Temp:  [98.3 F (36.8 C)-98.7 F (37.1 C)] 98.7 F (37.1 C) (10/10 1459) Pulse Rate:  [86-103] 103 (10/10 1651) Resp:  [16-20] 20 (10/10 1651) BP: (117-126)/(56-69) 126/62 mmHg (10/10 1651) SpO2:  [96 %-  99 %] 96 % (10/10 1651) HEMODYNAMICS:   VENTILATOR SETTINGS:   INTAKE / OUTPUT: Intake/Output   None     PHYSICAL EXAMINATION: General:  Looks well. NOt intubated Neuro:  AXOX3. HOH. Poor historian. Moves all 4s HEENT:  Supple neck. Mucosa moist surprisingly Cardiovascular:  Tacycardic. SInus. Normal heart soudns Lungs:  CTA bilaterally Abdomen:  Obese, soft, Wound dressing + Musculoskeletal:  No cyanosis, No clubbing,. No edema Skin:  Normal skin turgoor  LABS: PULMONARY No results found for this basename: PHART, PCO2, PCO2ART, PO2, PO2ART, HCO3, TCO2, O2SAT,  in the last 168 hours  CBC  Recent Labs Lab 05/16/13 1325  HGB 14.5  HCT 42.9  WBC 7.4  PLT 685*    COAGULATION  Recent Labs Lab 05/16/13 1325  INR 1.13    CARDIAC   Recent Labs Lab 05/16/13 1500  TROPONINI <0.30   No results found for this basename: PROBNP,  in the last 168 hours   CHEMISTRY  Recent Labs Lab 05/16/13 1325 05/16/13 1500  NA HEMOLYZED SPECIMEN, RESULTS MAY BE AFFECTED 127*  K HEMOLYZED SPECIMEN, RESULTS MAY BE AFFECTED >7.5*  CL HEMOLYZED SPECIMEN, RESULTS MAY BE AFFECTED 99  CO2 HEMOLYZED SPECIMEN, RESULTS MAY BE AFFECTED  15*  GLUCOSE HEMOLYZED SPECIMEN, RESULTS MAY BE AFFECTED 101*  BUN HEMOLYZED SPECIMEN, RESULTS MAY BE AFFECTED 65*  CREATININE HEMOLYZED SPECIMEN, RESULTS MAY BE AFFECTED 6.11*  CALCIUM HEMOLYZED SPECIMEN, RESULTS MAY BE AFFECTED 9.3   The CrCl is unknown because both a height and weight (above a minimum accepted value) are required for this calculation.   LIVER  Recent Labs Lab 05/16/13 1325 05/16/13 1500  AST HEMOLYZED SPECIMEN, RESULTS MAY BE AFFECTED 22  ALT HEMOLYZED SPECIMEN, RESULTS MAY BE AFFECTED 56*  ALKPHOS HEMOLYZED SPECIMEN, RESULTS MAY BE AFFECTED 135*  BILITOT HEMOLYZED SPECIMEN, RESULTS MAY BE AFFECTED 0.5  PROT HEMOLYZED SPECIMEN, RESULTS MAY BE AFFECTED 7.6  ALBUMIN HEMOLYZED SPECIMEN, RESULTS MAY BE AFFECTED 3.5  INR 1.13  --      INFECTIOUS  Recent Labs Lab 05/16/13 1325  LATICACIDVEN 1.3     ENDOCRINE CBG (last 3)   Recent Labs  05/16/13 1704  GLUCAP 102*         IMAGING x48h  No results found.     ASSESSMENT / PLAN:  PULMONARY A:maintaing airway P:   monitir  CARDIOVASCULAR A: ekg changes with hyperkalemia P:  Monitor Cycle enzymes  RENAL A:  Acute renal failure - ? Volume issues, ? Augmenting related P:   Check fena Check urine osm Check urine eos Fluid bolus HD cath at [facility] HD per renal Bicarb gtt per renal  GASTROINTESTINAL A:  crohins post op P:   ccs called per [facility]  HEMATOLOGIC A:  Thrombocytosis. Was supposed to take anticoagulation at home due to AT3 def P:  Monitor anticoag after placing HD cath  INFECTIOUS A:  No evidence of infection but was on immunsuppressants P:   monitor  ENDOCRINE A:  At risk for relative AI   P:   monitor  NEUROLOGIC A:  intact P:   monitor  TODAY'S SUMMARY: dad updatd. Move to [facility]. Consened to emegent HD cath. Till then bols with fluids and bicarb gtt  I have personally obtained a history, examined the patient, evaluated laboratory and imaging results,  formulated the assessment and plan and placed orders. CRITICAL CARE: The patient is critically ill with multiple organ systems failure and requires high complexity decision making for assessment and support, frequent evaluation and  titration of therapies, application of advanced monitoring technologies and extensive interpretation of multiple databases. Critical Care Time devoted to patient care services described in this note is 45 minutes.     Dr. Kalman Shan, M.D., [facility].C.P Pulmonary and Critical Care Medicine Staff Physician [facility] Pager: [phone], If no answer or between  15:00h - 7:00h: call [phone]  05/16/2013 5:49 PM

## 2013-05-16 NOTE — H&P (Addendum)
[facility] History and Physical  Rick Edwards ZOX:[id number] DOB: [birth year] DOA: 05/16/2013  Referring physician:  Blake Divine PCP:  Sanda Linger, MD   Chief Complaint:  Weakness, presyncope, nausea, and hiccoughs  HPI:  The patient is a 57 y.o. year-old male with history of Crohn's and antithrombin III deficiency with complicated past medical history who presents with weakness, nausea, and hiccoups.  He underwent hemicolectomy in 2001 due to a retroperitoneal abscess which remained stable for many years.  In August of 2013, however, he had total abdominal colectomy and ileostomy due to a partial obstruction and significant weight loss.  In September 2014, he underwent an ileostomy take down.  His course was complicated by the development of an intraabdominal abscess.  Because the area would be difficult to drain, he was treated with antibiotics only and he had partial resolution on follow up CT.  Because his abdominal pain improved, his white count and fever trended down, he was discharged to home on 05/09/2013 on Augmentin and with a wound vac.  He was told to continue his warfarin, however, he has not resumed this medication.  He states that prior to discharge, he had been making very little urine, however, he was feeling well.  His BUN was 6 and his creatinine was 0.76 on 10/1.  He was sent home with his mother and father who have cared for him this week.  He has had progressive weakness.  Previously he could walk, but he has been very fatigued with getting up to the bathroom and feels lightheaded with exertion.  He developed nausea with 2 episodes of nonbilious and nonbloody vomiting last night and severe hiccoughs.  He has had persistent abdominal pain.  His parents brought him to the ER for further evaluation.  He has not been taking his azathioprine or warfarin, but has been taking his other prescribed medications including his ARB and potassium.  He has not been taking any NSAIDS.  He  has been taking his antibiotic.  He states he has been eating and drinking some and has felt dehydrated.  Despite not making much urine, he has had watery stools about 5 times daily.    In the ER, initial CMP is hemolyzed, but concerning for potassium > 9, BUN 67, creatinine 6.7, bicarb 15.  CBC notable only for plt of 685.  Lactic acid 1.3.  ECG demonstrates NSR, peaked T-waves, QRS .  In the ER, he was given calcium gluconate, dextrose with insulin, sodium bicarb once, albuterol neb, and a 1L NS bolus.  Repeat CMP is pending at the time of admission call.  General surgery has been consulted and nephrology was notified by the ER physician.    Review of Systems:  General:  Episode of hot flash that responded to fan, denies chills, weight loss or gain HEENT:  Hearing impaired.  Denies rhinorrhea, sinus congestion, sore throat CV:  Denies chest pain and palpitations, lower extremity edema.  PULM:  Denies SOB, wheezing, cough.   GI:  Per HPI.     GU:  Denies dysuria, frequency, urgency ENDO:  Decreased uop.   HEME:  Denies hematemesis, blood in stools, melena, abnormal bruising or bleeding.  LYMPH:  Denies lymphadenopathy.   MSK:  Denies arthralgias, myalgias.   DERM:  Denies skin rash or ulcer.   NEURO:  Denies focal numbness, weakness, slurred speech, confusion, facial droop.  PSYCH:  Denies anxiety and depression.    Past Medical History  Diagnosis Date  . Crohn's  disease     tx. Imuran  . Perianal abscess 2001    s/p right hemicolectomy, and drainage of retroperitoneal abcess 2003  . Hypertension   . Hearing loss     wears hearing aid left ear; birthed with hearing loss  . GI bleed 6/11    w/ normal EGD 6/11, 3 PRBCs   . Anemia     anemia thrombocytopenia, saw Hematology 2011, can not r/o myeloproliferative d/c  . Transfusion history     last 8'13  . Ileostomy in place 04-11-13    04-18-13 ileostomy to be taken down.  Marland Kitchen HOH (hard of hearing) 05/16/2013  . Antithrombin  III deficiency 05/16/2013    On coumadin for this   Past Surgical History  Procedure Laterality Date  . Hemicolectomy  08/29/2001    perforated abscess  . Anal fistulotomy  2002  . Partial colectomy  03/11/2012    Procedure: PARTIAL COLECTOMY;  Surgeon: Mariella Saa, MD;  Location: [facility] ORS;  Service: General;  Laterality: N/A;  subtotal colectomy transverse and left colon   . Embolectomy  03/12/2012    Procedure: EMBOLECTOMY;  Surgeon: Chuck Hint, MD;  Location: [facility] OR;  Service: Vascular;  Laterality: Right;  Right popliteal embolectomy with vein patch angioplasty, right posterior tibial embolectomy with vein patch angioplasty   . Intraoperative arteriogram  03/12/2012    Procedure: INTRA OPERATIVE ARTERIOGRAM;  Surgeon: Chuck Hint, MD;  Location: [facility] OR;  Service: Vascular;  Laterality: Right;  . Transmetatarsal amputation  05/10/2012    right  . Ileostomy closure N/A 04/18/2013    Procedure: ILEOSTOMY TAKEDOWN;  Surgeon: Mariella Saa, MD;  Location: [facility] ORS;  Service: General;  Laterality: N/A;   Social History:  reports that he quit smoking about 2 years ago. His smoking use included Cigarettes. He has a 2.25 pack-year smoking history. He has never used smokeless tobacco. He reports that he does not drink alcohol or use illicit drugs. Currently staying with mom and dad.  Ambulates with cane or walker recently.    Allergies  Allergen Reactions  . Mesalamine     REACTION: Rash    Family History  Problem Relation Age of Onset  . Coronary artery disease Neg Hx   . Diabetes Neg Hx   . Colon cancer Neg Hx   . Prostate cancer Neg Hx   . Stroke      GF, aunts   . Hypertension      "the whole family"  . Hypertension Mother   . Hyperlipidemia Mother   . Hypertension Father   . Hyperlipidemia Father   . Heart disease Father   . Kidney failure Neg Hx      Prior to Admission medications   Medication Sig Start Date End Date Taking? Authorizing Provider   amoxicillin-clavulanate (AUGMENTIN) 875-125 MG per tablet Take 1 tablet by mouth 2 (two) times daily. 05/09/13  Yes Mariella Saa, MD  azaTHIOprine (IMURAN) 50 MG tablet Take 75 mg by mouth daily.   Yes Historical Provider, MD  Olmesartan-Amlodipine-HCTZ Marya Landry) 20-5-12.5 MG TABS Take 1 tablet by mouth daily after breakfast. 01/29/13  Yes Etta Grandchild, MD  oxyCODONE-acetaminophen (PERCOCET/ROXICET) 5-325 MG per tablet Take 1-2 tablets by mouth every 6 (six) hours as needed. 05/13/13  Yes Mariella Saa, MD  potassium chloride SA (K-DUR,KLOR-CON) 20 MEQ tablet Take 1 tablet (20 mEq total) by mouth daily. 05/09/13  Yes Mariella Saa, MD  warfarin (COUMADIN) 5 MG tablet Take 5  mg by mouth daily. Takes 2 tablet on Wednesday and Saturday and 1.5 tablets the rest of the week   Yes Historical Provider, MD   Physical Exam: Filed Vitals:   05/16/13 1113 05/16/13 1117 05/16/13 1459 05/16/13 1613  BP:  117/69 118/56   Pulse:  91 86   Temp:  98.3 F (36.8 C) 98.7 F (37.1 C)   TempSrc:  Oral Oral   Resp:  16    SpO2: 97% 96% 99% 96%     General:  Thin AAM, no acute distress, sunken eyes  Eyes:  PERRL, anicteric, non-injected.  ENT:  Nares clear.  OP mildly injected with some petechia on the soft palate, no exudates.  MMM.  Neck:  Supple without TM or JVD.    Lymph:  No cervical, supraclavicular, or submandibular LAD.  Cardiovascular:  RRR, normal S1, S2, slight heave, 2/6 systolic murmur at the LSB.  2+ pulses, warm extremities  Respiratory:  CTA bilaterally without increased WOB.  Abdomen:  Hyperactive BS.  Soft, moderately distended, nontender.   Skin:  No rashess.  Incision over midline with damp saline packing without odor or discharge.  granulation tissue forming.    Musculoskeletal:  Normal bulk and tone.  No LE edema.  RLE with forefoot amputation  Psychiatric:  A & O x 4.  Appropriate affect.  Neurologic:  CN 3-12 intact except hearing impairment.  5/5  strength.  Sensation intact.  Labs on Admission:  Basic Metabolic Panel:  Recent Labs Lab 05/16/13 1325 05/16/13 1500  NA HEMOLYZED SPECIMEN, RESULTS MAY BE AFFECTED 127*  K HEMOLYZED SPECIMEN, RESULTS MAY BE AFFECTED >7.5*  CL HEMOLYZED SPECIMEN, RESULTS MAY BE AFFECTED 99  CO2 HEMOLYZED SPECIMEN, RESULTS MAY BE AFFECTED 15*  GLUCOSE HEMOLYZED SPECIMEN, RESULTS MAY BE AFFECTED 101*  BUN HEMOLYZED SPECIMEN, RESULTS MAY BE AFFECTED 65*  CREATININE HEMOLYZED SPECIMEN, RESULTS MAY BE AFFECTED 6.11*  CALCIUM HEMOLYZED SPECIMEN, RESULTS MAY BE AFFECTED 9.3   Liver Function Tests:  Recent Labs Lab 05/16/13 1325 05/16/13 1500  AST HEMOLYZED SPECIMEN, RESULTS MAY BE AFFECTED 22  ALT HEMOLYZED SPECIMEN, RESULTS MAY BE AFFECTED 56*  ALKPHOS HEMOLYZED SPECIMEN, RESULTS MAY BE AFFECTED 135*  BILITOT HEMOLYZED SPECIMEN, RESULTS MAY BE AFFECTED 0.5  PROT HEMOLYZED SPECIMEN, RESULTS MAY BE AFFECTED 7.6  ALBUMIN HEMOLYZED SPECIMEN, RESULTS MAY BE AFFECTED 3.5    Recent Labs Lab 05/16/13 1325 05/16/13 1500  LIPASE HEMOLYZED SPECIMEN, RESULTS MAY BE AFFECTED 28   No results found for this basename: AMMONIA,  in the last 168 hours CBC:  Recent Labs Lab 05/16/13 1325  WBC 7.4  NEUTROABS 5.2  HGB 14.5  HCT 42.9  MCV 89.4  PLT 685*   Cardiac Enzymes:  Recent Labs Lab 05/16/13 1500  TROPONINI <0.30    BNP (last 3 results) No results found for this basename: PROBNP,  in the last 8760 hours CBG: No results found for this basename: GLUCAP,  in the last 168 hours  Radiological Exams on Admission: No results found.  EKG: Independently reviewed. ECG demonstrates NSR, LAFB, peaked T-waves, QRS 120, delayed R-wave progression.  Assessment/Plan Principal Problem:   Acute renal failure Active Problems:   HYPERTENSION   CROHN'S DISEASE-LARGE INTESTINE   Long term (current) use of anticoagulants   Antithrombin 3 deficiency   HOH (hard of hearing)   Antithrombin III  deficiency   Acute hyperkalemia   Generalized weakness   Nausea and vomiting   Hiccoughs  ---  Severe arrhythmia threatening hyperkalemia secondary to acute  renal failure.  Acute renal failure may be due to bladder outlet obstruction, nephritis related to antibiotics or other medications, or severe dehydration due to ongoing diarrhea.  Received insulin and dextrose already and received albuterol neb already.   -  Telemetry -  Additional dose of calcium now -  Additional amp of bicarb now -  Start bicarbonate containing fluids -  Kayexalate OK to give per surgery -  Patient appears dehydrated so will hold off on lasix for now -  Aggressive hydration -  May need hemodialysis, nephrology consulted -  RUS -  Place foley -  FENa -  UA and culture -  Transfer to [facility] ICU for ongoing care -  Repeat BMP q2h for next 8 hours to monitor bicarb and potassium -  Avoid medications that could precipitate arrhythmias  Hyponatremia, likely related to ARF -  Serum and urine osms -  Urine sodium -  Trend sodium  Crohn's disease with recent intraabdominal abscess and wound vac placement -  Appreciate general surgery recommendations -  Hold antibiotics for now while awaiting urine eosinophils  AT III deficiency, patient has not been taking his coumadin although his discharge summary states he was supposed to have restarted.   -  Start subcutaneous heparin for DVT prophylaxis  Nausea, vomiting, and hiccoughs are likely related to acute renal failure  + Anion gap metabolic acidosis likely secondary to ARF with elevated BUN.  -  Trend  Diet:  NPO Access:  PIV IVF:  Sodium bicarb Proph:  heparin  Code Status: FULL Family Communication: spoke with the patient and his family Disposition Plan: Admit to [facility] ICU  Time spent: 60 min Renae Fickle [facility] Pager [phone]  If 7PM-7AM, please contact night-coverage [email or web address] Password [email or web address] 05/16/2013, 5:00  PM

## 2013-05-16 NOTE — Telephone Encounter (Addendum)
Patient vomited  x one last night, He feels faint while lying down not able sit up very long without feeling like he is going to passing out. Hes' afebrile , he ate oak meal this morning and was able to keep it down but was nauseated . advised he may need to go to the ER to be elevated. Patient will going to [employer] [facility]  Paged

## 2013-05-16 NOTE — Progress Notes (Signed)
Dr. Arlean Hopping Deferred from foley and orthostatic vital signs. Critical potassium report to Dr. Geraldo Pitter and Dr. Arlean Hopping.

## 2013-05-17 ENCOUNTER — Inpatient Hospital Stay (HOSPITAL_COMMUNITY): Payer: No Typology Code available for payment source

## 2013-05-17 LAB — COMPREHENSIVE METABOLIC PANEL
ALT: 49 U/L (ref 0–53)
AST: 18 U/L (ref 0–37)
Albumin: 3.2 g/dL — ABNORMAL LOW (ref 3.5–5.2)
Alkaline Phosphatase: 117 U/L (ref 39–117)
Calcium: 9 mg/dL (ref 8.4–10.5)
Chloride: 95 mEq/L — ABNORMAL LOW (ref 96–112)
GFR calc Af Amer: 26 mL/min — ABNORMAL LOW (ref >90)
Sodium: 135 mEq/L (ref 135–145)
Total Bilirubin: 1 mg/dL (ref 0.3–1.2)
Total Protein: 6.8 g/dL (ref 6.0–8.3)

## 2013-05-17 LAB — URINE CULTURE: Culture: NO GROWTH

## 2013-05-17 LAB — BLOOD GAS, ARTERIAL
Bicarbonate: 29 mEq/L — ABNORMAL HIGH (ref 20.0–24.0)
Drawn by: 13898
O2 Content: 2 L/min
Patient temperature: 98.6
TCO2: 30.1 mmol/L (ref 0–100)
pCO2 arterial: 36.9 mmHg (ref 35.0–45.0)
pH, Arterial: 7.506 — ABNORMAL HIGH (ref 7.350–7.450)

## 2013-05-17 LAB — BASIC METABOLIC PANEL
BUN: 30 mg/dL — ABNORMAL HIGH (ref 6–23)
CO2: 27 mEq/L (ref 19–32)
Calcium: 9.1 mg/dL (ref 8.4–10.5)
Calcium: 8.8 mg/dL (ref 8.4–10.5)
Creatinine, Ser: 2.7 mg/dL — ABNORMAL HIGH (ref 0.50–1.35)
Creatinine, Ser: 2.5 mg/dL — ABNORMAL HIGH (ref 0.50–1.35)
GFR calc Af Amer: 29 mL/min — ABNORMAL LOW (ref >90)
GFR calc Af Amer: 32 mL/min — ABNORMAL LOW (ref >90)
GFR calc non Af Amer: 25 mL/min — ABNORMAL LOW (ref >90)
GFR calc non Af Amer: 28 mL/min — ABNORMAL LOW (ref >90)
Glucose, Bld: 111 mg/dL — ABNORMAL HIGH (ref 70–99)
Glucose, Bld: 107 mg/dL — ABNORMAL HIGH (ref 70–99)
Sodium: 134 mEq/L — ABNORMAL LOW (ref 135–145)
Sodium: 134 mEq/L — ABNORMAL LOW (ref 135–145)

## 2013-05-17 LAB — MAGNESIUM: Magnesium: 2 mg/dL (ref 1.5–2.5)

## 2013-05-17 LAB — CBC WITH DIFFERENTIAL/PLATELET
Basophils Absolute: 0.1 10*3/uL (ref 0.0–0.1)
Eosinophils Absolute: 0.1 10*3/uL (ref 0.0–0.7)
Eosinophils Relative: 2 % (ref 0–5)
Lymphocytes Relative: 20 % (ref 12–46)
MCH: 30 pg (ref 26.0–34.0)
MCV: 87.3 fL (ref 78.0–100.0)
Platelets: 482 10*3/uL — ABNORMAL HIGH (ref 150–400)
RDW: 15.1 % (ref 11.5–15.5)
WBC: 6.5 10*3/uL (ref 4.0–10.5)

## 2013-05-17 LAB — HEPATITIS B SURFACE ANTIBODY,QUALITATIVE: Hep B S Ab: NEGATIVE

## 2013-05-17 LAB — TROPONIN I: Troponin I: 0.39 ng/mL (ref <0.30)

## 2013-05-17 LAB — CLOSTRIDIUM DIFFICILE BY PCR: Toxigenic C. Difficile by PCR: NEGATIVE

## 2013-05-17 LAB — POTASSIUM: Potassium: 4.7 mEq/L (ref 3.5–5.1)

## 2013-05-17 LAB — URIC ACID, RANDOM URINE: Uric Acid, Urine: 19 mg/dL

## 2013-05-17 LAB — HEPATITIS B SURFACE ANTIGEN: Hepatitis B Surface Ag: NEGATIVE

## 2013-05-17 LAB — HEPATITIS B CORE ANTIBODY, TOTAL: Hep B Core Total Ab: NEGATIVE

## 2013-05-17 MED ORDER — SODIUM CHLORIDE 0.9 % IV SOLN
INTRAVENOUS | Status: DC
  Administered 2013-05-17 – 2013-05-20 (×6): via INTRAVENOUS

## 2013-05-17 MED ORDER — ENOXAPARIN SODIUM 100 MG/ML ~~LOC~~ SOLN
85.0000 mg | Freq: Two times a day (BID) | SUBCUTANEOUS | Status: DC
  Administered 2013-05-17: 85 mg via SUBCUTANEOUS
  Administered 2013-05-18: 22:00:00 40 mg via SUBCUTANEOUS
  Administered 2013-05-18 – 2013-05-20 (×5): 85 mg via SUBCUTANEOUS
  Filled 2013-05-17 (×11): qty 1

## 2013-05-17 MED ORDER — WARFARIN SODIUM 7.5 MG PO TABS
7.5000 mg | ORAL_TABLET | Freq: Once | ORAL | Status: AC
  Administered 2013-05-17: 7.5 mg via ORAL
  Filled 2013-05-17: qty 1

## 2013-05-17 MED ORDER — HEPARIN SODIUM (PORCINE) 1000 UNIT/ML DIALYSIS
1000.0000 [IU] | INTRAMUSCULAR | Status: DC | PRN
  Filled 2013-05-17: qty 1

## 2013-05-17 MED ORDER — WARFARIN - PHARMACIST DOSING INPATIENT
Freq: Every day | Status: DC
  Administered 2013-05-19: 18:00:00

## 2013-05-17 MED ORDER — ALTEPLASE 2 MG IJ SOLR
2.0000 mg | Freq: Once | INTRAMUSCULAR | Status: AC | PRN
  Filled 2013-05-17: qty 2

## 2013-05-17 MED ORDER — HEPARIN (PORCINE) IN NACL 100-0.45 UNIT/ML-% IJ SOLN
1150.0000 [IU]/h | INTRAMUSCULAR | Status: DC
  Administered 2013-05-17: 1150 [IU]/h via INTRAVENOUS
  Filled 2013-05-17: qty 250

## 2013-05-17 MED ORDER — HEPARIN SODIUM (PORCINE) 1000 UNIT/ML DIALYSIS
8000.0000 [IU] | INTRAMUSCULAR | Status: DC | PRN
  Filled 2013-05-17: qty 8

## 2013-05-17 MED ORDER — SODIUM CHLORIDE 0.9 % IV SOLN: 100.0000 mL | INTRAVENOUS | Status: DC | PRN

## 2013-05-17 MED ORDER — SODIUM CHLORIDE 0.9 % IV BOLUS (SEPSIS)
1000.0000 mL | Freq: Once | INTRAVENOUS | Status: AC
  Administered 2013-05-17: 1000 mL via INTRAVENOUS

## 2013-05-17 MED ORDER — HEPARIN BOLUS VIA INFUSION
5000.0000 [IU] | Freq: Once | INTRAVENOUS | Status: AC
  Administered 2013-05-17: 5000 [IU] via INTRAVENOUS
  Filled 2013-05-17: qty 5000

## 2013-05-17 NOTE — Progress Notes (Addendum)
ANTICOAGULATION CONSULT NOTE - Initial Consult  Pharmacy Consult for warfarin + Heparin Indication: AT3 deficiency, arterial thrombus  Allergies  Allergen Reactions  . Mesalamine     REACTION: Rash    Patient Measurements: Weight: 184 lb (83.462 kg) IBW 70.46 Heparin Dosing Weight: 83.5 kg  Vital Signs: Temp: 98.8 F (37.1 C) (10/11 0740) Temp src: Oral (10/11 0740) BP: 88/59 mmHg (10/11 0700) Pulse Rate: 102 (10/11 0700)  Labs:  Recent Labs  05/16/13 1325  05/16/13 1750 05/16/13 1757 05/16/13 1958 05/17/13 0147 05/17/13 0500  HGB 14.5  --   --  14.6 13.6  --  12.0*  HCT 42.9  --   --  43.0 39.3  --  34.9*  PLT 685*  --   --   --  583*  --  482*  APTT 37  --   --   --   --   --   --   LABPROT 14.3  --   --   --   --   --   --   INR 1.13  --   --   --   --   --   --   CREATININE HEMOLYZED SPECIMEN, RESULTS MAY BE AFFECTED  < > 5.09* 6.10* 4.57*  --  2.98*  TROPONINI  --   < > <0.30  --  <0.30 0.39*  --   < > = values in this interval not displayed.  The CrCl is unknown because both a height and weight (above a minimum accepted value) are required for this calculation.   Medical History: Past Medical History  Diagnosis Date  . Crohn's disease     tx. Imuran  . Perianal abscess 2001    s/p right hemicolectomy, and drainage of retroperitoneal abcess 2003  . Hypertension   . Hearing loss     wears hearing aid left ear; birthed with hearing loss  . GI bleed 6/11    w/ normal EGD 6/11, 3 PRBCs   . Anemia     anemia thrombocytopenia, saw Hematology 2011, can not r/o myeloproliferative d/c  . Transfusion history     last 8'13  . Ileostomy in place 04-11-13    04-18-13 ileostomy to be taken down.  Marland Kitchen HOH (hard of hearing) 05/16/2013  . Antithrombin III deficiency 05/16/2013    On coumadin for this    Medications:  Prescriptions prior to admission  Medication Sig Dispense Refill  . amoxicillin-clavulanate (AUGMENTIN) 875-125 MG per tablet Take 1 tablet by  mouth 2 (two) times daily.  14 tablet  3  . azaTHIOprine (IMURAN) 50 MG tablet Take 75 mg by mouth daily.      . Olmesartan-Amlodipine-HCTZ (TRIBENZOR) 20-5-12.5 MG TABS Take 1 tablet by mouth daily after breakfast.  30 tablet  5  . oxyCODONE-acetaminophen (PERCOCET/ROXICET) 5-325 MG per tablet Take 1-2 tablets by mouth every 6 (six) hours as needed.  50 tablet  0  . potassium chloride SA (K-DUR,KLOR-CON) 20 MEQ tablet Take 1 tablet (20 mEq total) by mouth daily.  20 tablet  0  . warfarin (COUMADIN) 5 MG tablet Take 5 mg by mouth daily. Take 1 tablet (5mg ) every Wed and Saturday and 1.5 tablets (7.5mg ) all other days of the week.        Assessment: 56 yom presented to the [facility] MICU after transfer from [facility] for emergent HD d/t hyperkalemia and acidosis. Pt was on chronic coumadin for hx AT3 deficiency and arterial thrombosis however pt has not been  taking coumadin recently. INR yesterday afternoon was WNL. INR today pending. Pt is currently on heparin SQ for VTE prophylaxis. To resume coumadin today.   Goal of Therapy:  INR 2-3   Plan:  1. Coumadin 7.5mg  PO x 1 tonight 2. Daily INR 3. Consider full dose therapeutic anticoagulation with lovenox or heparin while INR is subtherapeutic  Rumbarger, Drake Leach 05/17/2013,9:07 AM  Addenedum: After discussion with CCM NP (Tammy), adding full-dose heparin for bridge with warfarin while INR sub-therapeutic due to hypercoagulable state with AT3 deficiency and arterial thrombi. IV heparin chosen due to potential for further interventions to rapid clearance of drug. Patient with acute renal failure who received HD overnight.   H/H 12/34.9, Plts 482.  No bleeding noted.  INR 1.13 from 05/16/13.  Last dose of sq heparin 5,000 units at 0545 AM.  Plan: Heparin bolus of 5000 units, then Heparin at rate of 1150 units/hr. (Will likely need increase rate due to known AT3 deficiency).  Heparin level in 8 hours.  If no further interventions planned,  consider switch to Lovenox until INR >2.   Link Snuffer, PharmD, BCPS Clinical Pharmacist [phone] 05/17/2013, 12:00 PM

## 2013-05-17 NOTE — Progress Notes (Signed)
CRITICAL VALUE ALERT  Critical value received:  Troponin .39  Date of notification:  05/16/13  Time of notification:  0335  Critical value read back:yes Nurse who received alert:  Crist Fat RN  MD notified (1st page):  Dr. Blima Dessert in elink  Time of first page:  0330  MD notified (2nd page):NA  Time of second page:NA  Responding MD:  Dr. Herma Carson  Time MD responded:  0330

## 2013-05-17 NOTE — Progress Notes (Signed)
PT Cancellation Note  Patient Details Name: Rick Edwards MRN: [id number] DOB: 05/15/1956   Cancelled Treatment:    Reason Eval/Treat Not Completed: Patient not medically ready.   Shermaine Brigham 05/17/2013, 2:24 PM

## 2013-05-17 NOTE — Progress Notes (Signed)
Subjective: PT doing well this aM.  Con't with diarrhea.  Cdiff pending  Objective: Vital signs in last 24 hours: Temp:  [98.1 F (36.7 C)-98.8 F (37.1 C)] 98.8 F (37.1 C) (10/11 0740) Pulse Rate:  [86-115] 102 (10/11 0700) Resp:  [16-32] 19 (10/11 0700) BP: (79-126)/(43-82) 88/59 mmHg (10/11 0700) SpO2:  [94 %-100 %] 97 % (10/11 0700) Weight:  [184 lb (83.462 kg)] 184 lb (83.462 kg) (10/11 0400) Last BM Date: 05/17/13  Intake/Output from previous day: 10/10 0701 - 10/11 0700 In: 1620 [P.O.:120; I.V.:1500] Out: 580 [Urine:550; Stool:180] Intake/Output this shift:    General appearance: alert and cooperative GI: midline wound c/d/i, normal bowel function  Lab Results:   Recent Labs  05/16/13 1958 05/17/13 0500  WBC 8.5 6.5  HGB 13.6 12.0*  HCT 39.3 34.9*  PLT 583* 482*   BMET  Recent Labs  05/16/13 1958 05/17/13 0144 05/17/13 0500  NA 134*  --  135  K 7.2* 4.7 5.7*  CL 100  --  95*  CO2 18*  --  30  GLUCOSE 115*  --  118*  BUN 57*  --  30*  CREATININE 4.57*  --  2.98*  CALCIUM 9.6  --  9.0   PT/INR  Recent Labs  05/16/13 1325  LABPROT 14.3  INR 1.13   ABG  Recent Labs  05/17/13 0430  PHART 7.506*  HCO3 29.0*    Studies/Results: US Renal  05/17/2013   *RADIOLOGY REPORT*  Clinical Data:  Acute renal failure.  RENAL/URINARY TRACT ULTRASOUND COMPLETE  Comparison:  CT of the abdomen and pelvis performed 05/08/2013  Findings:  Right Kidney:  The right kidney measures 11.7 cm in length.  The kidney demonstrates normal size, echogenicity and configuration. No significant cortical thinning is seen.  No hydronephrosis or calcification is identified.  No masses are seen.  Left Kidney:  The left kidney measures 11.9 cm in length.  The kidney demonstrates normal size, echogenicity and configuration. No significant cortical thinning is seen.  No hydronephrosis or calcification is identified.  A small cyst is noted at the upper pole of the left kidney,  measuring 1.5 cm.  Bladder:  The bladder is decompressed and not well assessed.  Diffuse fatty infiltration is noted within the liver.  IMPRESSION:  1.  No evidence of hydronephrosis; renal echogenicity within normal limits. 2.  Small left renal cyst seen. 3.  Diffuse fatty infiltration within the liver.   Original Report Authenticated By: Tonia Ghent, M.D.   Dg Chest Port 1 View  05/16/2013   *RADIOLOGY REPORT*  Clinical Data: Placement of right-sided hemodialysis catheter.  PORTABLE CHEST - 1 VIEW  Comparison: Chest radiograph performed earlier today at 05:26 p.m.  Findings: The patient's right-sided dual lumen catheter is noted ending about the distal SVC.  The lungs are relatively well expanded.  Bibasilar atelectasis or scarring is again noted.  No pleural effusion or pneumothorax is seen.  The cardiomediastinal silhouette is mildly enlarged.  No acute osseous abnormalities are identified.  IMPRESSION:  1.  Right-sided dual lumen catheter noted ending about the distal SVC. 2.  Bibasilar atelectasis or scarring again noted; lungs otherwise clear. 3.  Mild cardiomegaly.   Original Report Authenticated By: Tonia Ghent, M.D.   Dg Abd Acute W/chest  05/16/2013   CLINICAL DATA:  Status post colectomy and ileostomy reversal. Abdominal infection. Dizziness, weakness and nausea. Firm and distended abdomen.  EXAM: ACUTE ABDOMEN SERIES (ABDOMEN 2 VIEW & CHEST 1 VIEW)  COMPARISON:  Multiple  priors, most recently CT of the abdomen and pelvis 05/08/2013.  FINDINGS: Lung volumes are low and there are linear opacities in lung bases bilaterally, most compatible with subsegmental atelectasis. No definite consolidative airspace disease. No pleural effusions. No evidence of pulmonary edema. Heart size is normal. Mediastinal contours are unremarkable.  There are numerous dilated loops of small bowel throughout the abdomen, measuring up to approximately 5.4 cm in diameter. There appears to be some distal rectal gas. A  suture line is noted in the central pelvis, likely a side of ileorectal anastomosis. Surgical clips also project over the right side of the abdomen and skin staples project over the low anatomic pelvis. No frank pneumoperitoneum is identified on the left lateral decubitus view. However, there are multiple air-fluid levels within the dilated loops of small bowel.  IMPRESSION: *The appearance of the abdomen is suggestive of a potential small bowel obstruction. However, given the appearance on the prior CT scan and the patient's postoperative state, the possibility of an ileus warrants consider consideration. Clinical correlation is recommended. *No pneumoperitoneum. *Low lung volumes with bibasilar subsegmental atelectasis.   Electronically Signed   By: Trudie Reed M.D.   On: 05/16/2013 17:49    Anti-infectives: Anti-infectives   None      Assessment/Plan: s/p * No surgery found * await Cdiff cx Midline wound looks good-WTD midline wound dressing changes, no need for vac    LOS: 1 day    Marigene Ehlers., Jed Limerick 05/17/2013

## 2013-05-17 NOTE — Progress Notes (Signed)
Admit: 05/16/2013 LOS: 1  34M w/ AKI 2/2 CIN + Hypovolemia (N/V, D) on ARB and KCl.    Subjective:  HD last night, K 4.7, inc to 5.7 this AM Pt w/o c/o Ongoing BMs No SOB  HCO3 gtt changed to NS gtt On warfarin for ATIII def Voiding spontaneously Renal US w/o obstruction  10/10 0701 - 10/11 0700 In: 1620 [P.O.:120; I.V.:1500] Out: 580 [Urine:550; Stool:180]  Filed Weights   05/17/13 0400  Weight: 83.462 kg (184 lb)    Current meds: reviewed  Current Labs: reviewed    Physical Exam:  Blood pressure 88/59, pulse 102, temperature 98.8 F (37.1 C), temperature source Oral, resp. rate 19, weight 83.462 kg (184 lb), SpO2 97.00%. NAD, resting calmly, cooperative and interactive CV: RRR, nl s1s2, no rub LUNGS: CTAB. Nl wob EXT: no LEE ABD: bandaged, soft, nontender GU: nl genitalia, no foley NEURO: nonfocal ENT: NCAT NECK: R IJ VasCath noted   Assessment/Plan 1. AKI 2/2 Contrast Nephropathy (CT 10/01) + Hypovolemia (diarrhea, N/V) on ARB and KCl: Rec HD overnight with normalized K, now mildly inc again.  BUN and acid base status stable.  Vol status stable.  BMP repeated at 12pm and HD needs decided at that time.  Cont supportive care, maintain euvolemia, and hopefuly renal function will begin to improve 2. Hyperkalemia: had peaked Ts, failed to respond to medical mgnt.  As above 3. Metabolic acidosis: resolved  [name] Heck MD 05/17/2013, 10:05 AM   Recent Labs Lab 05/16/13 1750 05/16/13 1757 05/16/13 1958 05/17/13 0144 05/17/13 0500  NA 131* 133* 134*  --  135  K 6.4* 7.2* 7.2* 4.7 5.7*  CL 100 108 100  --  95*  CO2 16*  --  18*  --  30  GLUCOSE 105* 104* 115*  --  118*  BUN 62* 81* 57*  --  30*  CREATININE 5.09* 6.10* 4.57*  --  2.98*  CALCIUM 9.5  --  9.6  --  9.0    Recent Labs Lab 05/16/13 1325 05/16/13 1757 05/16/13 1958 05/17/13 0500  WBC 7.4  --  8.5 6.5  NEUTROABS 5.2  --   --  4.3  HGB 14.5 14.6 13.6 12.0*  HCT 42.9 43.0 39.3 34.9*  MCV  89.4  --  88.5 87.3  PLT 685*  --  583* 482*    Current Facility-Administered Medications  Medication Dose Route Frequency Provider Last Rate Last Dose  . 0.9 %  sodium chloride infusion  250 mL Intravenous PRN Kalman Shan, MD      . 0.9 %  sodium chloride infusion  100 mL Intravenous PRN Arita Miss, MD      . 0.9 %  sodium chloride infusion  100 mL Intravenous PRN Arita Miss, MD      . 0.9 %  sodium chloride infusion   Intravenous Continuous Julio Sicks, NP 75 mL/hr at 05/17/13 0916    . acetaminophen (TYLENOL) tablet 650 mg  650 mg Oral Q6H PRN Renae Fickle, MD   650 mg at 05/17/13 0042   Or  . acetaminophen (TYLENOL) suppository 650 mg  650 mg Rectal Q6H PRN Renae Fickle, MD      . alteplase (CATHFLO ACTIVASE) injection 2 mg  2 mg Intracatheter Once PRN Arita Miss, MD      . calcium gluconate 1 g in sodium chloride 0.9 % 100 mL IVPB  1 g Intravenous Once Candyce Churn, MD      . heparin  injection 1,000 Units  1,000 Units Dialysis PRN Arita Miss, MD      . heparin injection 5,000 Units  5,000 Units Subcutaneous Q8H Renae Fickle, MD   5,000 Units at 05/17/13 0545  . heparin injection 8,000 Units  8,000 Units Dialysis PRN Arita Miss, MD      . heparin lock flush 100 unit/mL  500 Units Intravenous Once Maree Krabbe, MD      . labetalol (NORMODYNE,TRANDATE) injection 10 mg  10 mg Intravenous Q2H PRN Renae Fickle, MD      . oxyCODONE-acetaminophen (PERCOCET/ROXICET) 5-325 MG per tablet 1-2 tablet  1-2 tablet Oral Q6H PRN Renae Fickle, MD   1 tablet at 05/17/13 0902  . pantoprazole (PROTONIX) injection 40 mg  40 mg Intravenous QHS Kalman Shan, MD   40 mg at 05/16/13 2101  . sodium bicarbonate injection 50 mEq  50 mEq Intravenous Once Renae Fickle, MD      . sodium chloride 0.9 % bolus 2,000 mL  2,000 mL Intravenous Once Kalman Shan, MD      . sodium chloride 0.9 % injection 3 mL  3 mL Intravenous Q12H Renae Fickle, MD   3 mL at  05/16/13 2101  . sodium polystyrene (KAYEXALATE) 15 GM/60ML suspension 45 g  45 g Oral Once Maree Krabbe, MD      . warfarin (COUMADIN) tablet 7.5 mg  7.5 mg Oral ONCE-1800 Drake Leach Rumbarger, [facility]      . Warfarin - Pharmacist Dosing Inpatient   Does not apply q1800 Drake Leach Rumbarger, [facility]

## 2013-05-17 NOTE — Progress Notes (Signed)
ANTICOAGULATION CONSULT NOTE - Initial Consult  Pharmacy Consult for Lovenox Indication: VTE prophylaxis (AT3 deficiency, hx arterial thrombi) -bridging with warfarin.   Patient Measurements: Weight: 184 lb (83.462 kg) IBW 70.46 Vital Signs: Temp: 98.8 F (37.1 C) (10/11 0740) Temp src: Oral (10/11 0740) BP: 91/66 mmHg (10/11 1100) Pulse Rate: 103 (10/11 1100) Labs:  Recent Labs  05/16/13 1325  05/16/13 1757 05/16/13 1958 05/17/13 0147 05/17/13 0500 05/17/13 0930 05/17/13 0947 05/17/13 1113  HGB 14.5  --  14.6 13.6  --  12.0*  --   --   --   HCT 42.9  --  43.0 39.3  --  34.9*  --   --   --   PLT 685*  --   --  583*  --  482*  --   --   --   APTT 37  --   --   --   --   --   --   --   --   LABPROT 14.3  --   --   --   --   --  13.7  --   --   INR 1.13  --   --   --   --   --  1.07  --   --   CREATININE HEMOLYZED SPECIMEN, RESULTS MAY BE AFFECTED  < > 6.10* 4.57*  --  2.98*  --   --  2.70*  TROPONINI  --   < >  --  <0.30 0.39*  --   --  <0.30  --   < > = values in this interval not displayed.  The CrCl is unknown because both a height and weight (above a minimum accepted value) are required for this calculation.  Assessment: 56 YOM on warfarin PTA for antithrombin III deficency and history of arterial thrombi resume on warfarin tonight and bridging with heparin to change to Lovenox. Patient is in acute renal failure s/p HD overnight. SCr down to 2.50. Spoke with Dr. Arta Silence and feels renal function is now spontaneously improving on its own based on SCr trend over hours post-HD and ok with starting Lovenox. Current CrCl~ 30-35 ml/min. Patient is making urine.   Goal of Therapy:  Anti-Xa level 0.6-1.2 units/ml 4hrs after LMWH dose given Monitor platelets by anticoagulation protocol: Yes   Plan:  1. Stop Heparin gtt (done by RN at 1420 pm).  2. Start Lovenox at 85 mg SQ every 12 hours. 3. Monitor renal function closely and adjust dose as needed.  4. Consider checking  anti-Xa level after 4 doses.  5. Continue with previous orders for warfarin and f/up PT/INR in AM.  6. Monitor CBC q72hr.  Link Snuffer, PharmD, BCPS Clinical Pharmacist [phone] 05/17/2013,2:10 PM

## 2013-05-17 NOTE — H&P (Addendum)
PULMONARY  / CRITICAL CARE MEDICINE  Name: Rick Edwards MRN: [id number] DOB: [birth year] PCP Sanda Linger, MD    ADMISSION DATE:  05/16/2013 CONSULTATION DATE:  05/16/2013   REFERRING MD :  Owens Loffler OF [facility] PRIMARY SERVICE: [facility] -> PCCM  CHIEF COMPLAINT:  Acute renal failure/Hyperkalemia   BRIEF: 57 yo w/ hx of Crohn's readmitted 10/10 with acute renal failure and hyperkalemia requiring HD . Recent discharge 10/3 after intraabdominal abscess post op from ileostomy take down , discharged home on wound vac. Pt w/ diarrhea and poor intake prior to admission .  Last ABD CT w/ contrast on 10/2.    SIGNIFICANT EVENTS / STUDIES:  Admit 05/16/2013 HD 10/10  Renal US 10/10 >no hydronephrosis AXR 05/16/13  - Possible ileus   LINES / TUBES: HD Cath -  05/16/2013   CULTURES: 10/10 MRSA PCR - neg 10/10C.Diff >NEG 10/10 UC >     ANTIBIOTICS: Anti-infectives   None        SUBJECTIVE:   Continues w/ diarrhea Received first HD last pm  VITAL SIGNS: Temp:  [98.1 F (36.7 C)-98.8 F (37.1 C)] 98.8 F (37.1 C) (10/11 0740) Pulse Rate:  [86-115] 102 (10/11 0700) Resp:  [16-32] 19 (10/11 0700) BP: (79-126)/(43-82) 88/59 mmHg (10/11 0700) SpO2:  [94 %-100 %] 97 % (10/11 0700) Weight:  [83.462 kg (184 lb)] 83.462 kg (184 lb) (10/11 0400) HEMODYNAMICS:   VENTILATOR SETTINGS:   INTAKE / OUTPUT: Intake/Output     10/10 0701 - 10/11 0700 10/11 0701 - 10/12 0700   P.O. 120    I.V. (mL/kg) 1500 (18)    Total Intake(mL/kg) 1620 (19.4)    Urine (mL/kg/hr) 550    Other -150    Stool 180    Total Output 580     Net +1040            PHYSICAL EXAMINATION: General:  Weak, frail  Neuro:  AXOX3. HOH. . Moves all 4s HEENT:  Supple neck. Mucosa moist  Cardiovascular:  Tacycardic. SInus. Normal heart soudns Lungs:  CTA bilaterally Abdomen:  Obese, soft, Wound dressing + distended  Musculoskeletal:  No cyanosis, No clubbing,. tr edema Skin:  no rash , mid abd  wound dressing   LABS: PULMONARY  Recent Labs Lab 05/16/13 1757 05/17/13 0430  PHART  --  7.506*  PCO2ART  --  36.9  PO2ART  --  78.4*  HCO3  --  29.0*  TCO2 18 30.1  O2SAT  --  95.9    CBC  Recent Labs Lab 05/16/13 1325 05/16/13 1757 05/16/13 1958 05/17/13 0500  HGB 14.5 14.6 13.6 12.0*  HCT 42.9 43.0 39.3 34.9*  WBC 7.4  --  8.5 6.5  PLT 685*  --  583* 482*    COAGULATION  Recent Labs Lab 05/16/13 1325  INR 1.13    CARDIAC    Recent Labs Lab 05/16/13 1500 05/16/13 1750 05/16/13 1958 05/17/13 0147  TROPONINI <0.30 <0.30 <0.30 0.39*   No results found for this basename: PROBNP,  in the last 168 hours   CHEMISTRY  Recent Labs Lab 05/16/13 1325 05/16/13 1500 05/16/13 1750 05/16/13 1757 05/16/13 1958 05/17/13 0144 05/17/13 0500  NA HEMOLYZED SPECIMEN, RESULTS MAY BE AFFECTED 127* 131* 133* 134*  --  135  K HEMOLYZED SPECIMEN, RESULTS MAY BE AFFECTED >7.5* 6.4* 7.2* 7.2* 4.7 5.7*  CL HEMOLYZED SPECIMEN, RESULTS MAY BE AFFECTED 99 100 108 100  --  95*  CO2 HEMOLYZED SPECIMEN, RESULTS MAY BE AFFECTED 15*  16*  --  18*  --  30  GLUCOSE HEMOLYZED SPECIMEN, RESULTS MAY BE AFFECTED 101* 105* 104* 115*  --  118*  BUN HEMOLYZED SPECIMEN, RESULTS MAY BE AFFECTED 65* 62* 81* 57*  --  30*  CREATININE HEMOLYZED SPECIMEN, RESULTS MAY BE AFFECTED 6.11* 5.09* 6.10* 4.57*  --  2.98*  CALCIUM HEMOLYZED SPECIMEN, RESULTS MAY BE AFFECTED 9.3 9.5  --  9.6  --  9.0  MG  --   --   --   --   --   --  2.0   The CrCl is unknown because both a height and weight (above a minimum accepted value) are required for this calculation.   LIVER  Recent Labs Lab 05/16/13 1325 05/16/13 1500 05/17/13 0500  AST HEMOLYZED SPECIMEN, RESULTS MAY BE AFFECTED 22 18  ALT HEMOLYZED SPECIMEN, RESULTS MAY BE AFFECTED 56* 49  ALKPHOS HEMOLYZED SPECIMEN, RESULTS MAY BE AFFECTED 135* 117  BILITOT HEMOLYZED SPECIMEN, RESULTS MAY BE AFFECTED 0.5 1.0  PROT HEMOLYZED SPECIMEN, RESULTS MAY  BE AFFECTED 7.6 6.8  ALBUMIN HEMOLYZED SPECIMEN, RESULTS MAY BE AFFECTED 3.5 3.2*  INR 1.13  --   --      INFECTIOUS  Recent Labs Lab 05/16/13 1325  LATICACIDVEN 1.3     ENDOCRINE CBG (last 3)   Recent Labs  05/16/13 1704  GLUCAP 102*   IMAGING x48h  US Renal  05/17/2013   *RADIOLOGY REPORT*  Clinical Data:  Acute renal failure.  RENAL/URINARY TRACT ULTRASOUND COMPLETE  Comparison:  CT of the abdomen and pelvis performed 05/08/2013  Findings:  Right Kidney:  The right kidney measures 11.7 cm in length.  The kidney demonstrates normal size, echogenicity and configuration. No significant cortical thinning is seen.  No hydronephrosis or calcification is identified.  No masses are seen.  Left Kidney:  The left kidney measures 11.9 cm in length.  The kidney demonstrates normal size, echogenicity and configuration. No significant cortical thinning is seen.  No hydronephrosis or calcification is identified.  A small cyst is noted at the upper pole of the left kidney, measuring 1.5 cm.  Bladder:  The bladder is decompressed and not well assessed.  Diffuse fatty infiltration is noted within the liver.  IMPRESSION:  1.  No evidence of hydronephrosis; renal echogenicity within normal limits. 2.  Small left renal cyst seen. 3.  Diffuse fatty infiltration within the liver.   Original Report Authenticated By: Tonia Ghent, M.D.   Dg Chest Port 1 View  05/16/2013   *RADIOLOGY REPORT*  Clinical Data: Placement of right-sided hemodialysis catheter.  PORTABLE CHEST - 1 VIEW  Comparison: Chest radiograph performed earlier today at 05:26 p.m.  Findings: The patient's right-sided dual lumen catheter is noted ending about the distal SVC.  The lungs are relatively well expanded.  Bibasilar atelectasis or scarring is again noted.  No pleural effusion or pneumothorax is seen.  The cardiomediastinal silhouette is mildly enlarged.  No acute osseous abnormalities are identified.  IMPRESSION:  1.  Right-sided  dual lumen catheter noted ending about the distal SVC. 2.  Bibasilar atelectasis or scarring again noted; lungs otherwise clear. 3.  Mild cardiomegaly.   Original Report Authenticated By: Tonia Ghent, M.D.   Dg Abd Acute W/chest  05/16/2013   CLINICAL DATA:  Status post colectomy and ileostomy reversal. Abdominal infection. Dizziness, weakness and nausea. Firm and distended abdomen.  EXAM: ACUTE ABDOMEN SERIES (ABDOMEN 2 VIEW & CHEST 1 VIEW)  COMPARISON:  Multiple priors, most recently CT of the abdomen  and pelvis 05/08/2013.  FINDINGS: Lung volumes are low and there are linear opacities in lung bases bilaterally, most compatible with subsegmental atelectasis. No definite consolidative airspace disease. No pleural effusions. No evidence of pulmonary edema. Heart size is normal. Mediastinal contours are unremarkable.  There are numerous dilated loops of small bowel throughout the abdomen, measuring up to approximately 5.4 cm in diameter. There appears to be some distal rectal gas. A suture line is noted in the central pelvis, likely a side of ileorectal anastomosis. Surgical clips also project over the right side of the abdomen and skin staples project over the low anatomic pelvis. No frank pneumoperitoneum is identified on the left lateral decubitus view. However, there are multiple air-fluid levels within the dilated loops of small bowel.  IMPRESSION: *The appearance of the abdomen is suggestive of a potential small bowel obstruction. However, given the appearance on the prior CT scan and the patient's postoperative state, the possibility of an ileus warrants consider consideration. Clinical correlation is recommended. *No pneumoperitoneum. *Low lung volumes with bibasilar subsegmental atelectasis.   Electronically Signed   By: Trudie Reed M.D.   On: 05/16/2013 17:49       ASSESSMENT / PLAN:  PULMONARY A:maintaing airway w/ adequate sats  P:   Titrate O2 for sat >90%  CARDIOVASCULAR A:   Hypovolemia  EKG changes w/ hyperkalemia, minimum bump in trop (0.39)  P:  IVF at 75cc /h  Monitor closely   RENAL A:  Acute renal failure - in setting of hypovolemia from chronic diarrhea, ARB and recent CT contrast >10/10 HD  Hyperkalemia  10/11  Scr tr down , K improved but tr back up , HCO3 improved to nml on drip   P:   D/c HCO3  HD per renal  Cont IVF  Recheck lyutes  GASTROINTESTINAL A:  crohins post op  05/17/13: C Diff negative (staff note) P:   ccs following     HEMATOLOGIC A:  Thrombocytosis.  / AT3 def on chronic coumadin   P:  Monitor Coumadin per pharm protocol   INFECTIOUS A:  No evidence of infection but was on immunsuppressants P:   monitor  ENDOCRINE A:  At risk for relative AI   P:   monitor  NEUROLOGIC A:  intact P:   monitor    PARRETT,TAMMY NP Salena Saner  PCCM  [phone]  05/17/2013 8:33 AM   STAFF NOTE: I, Dr Lavinia Sharps have personally reviewed patient's available data, including medical history, events of note, physical examination and test results as part of my evaluation. I have discussed with resident/NP and other care providers such as pharmacist, RN and RRT.  In addition,  I personally evaluated patient and elicited key findings of acute renal failure. Improved acidosis. Bicarb stopped.  Will recheck BMET now to evaluate K and acidosis. Diarrhea cause unknown; will give 1L bolus. Making urine. Note: AT3 def, will change Iv heparin to loveniox Rx dose.*.  Rest per NP/medical resident whose note is outlined above and that I agree with  The patient is critically ill with multiple organ systems failure and requires high complexity decision making for assessment and support, frequent evaluation and titration of therapies, application of advanced monitoring technologies and extensive interpretation of multiple databases.   Critical Care Time devoted to patient care services described in this note is  35  Minutes.  Dr. Kalman Shan,  M.D., [facility].C.P Pulmonary and Critical Care Medicine Staff Physician [facility] [place] [facility] Pager: [phone]  [phone], If no answer or between  15:00h - 7:00h: call [phone]  05/17/2013 1:57 PM

## 2013-05-17 NOTE — Progress Notes (Signed)
cdiff precautions dcd, negative per micro lab results

## 2013-05-18 ENCOUNTER — Encounter (HOSPITAL_COMMUNITY): Payer: Self-pay

## 2013-05-18 DIAGNOSIS — K560 Paralytic ileus: Secondary | ICD-10-CM

## 2013-05-18 DIAGNOSIS — K929 Disease of digestive system, unspecified: Secondary | ICD-10-CM

## 2013-05-18 LAB — COMPREHENSIVE METABOLIC PANEL
ALT: 47 U/L (ref 0–53)
AST: 20 U/L (ref 0–37)
Alkaline Phosphatase: 111 U/L (ref 39–117)
CO2: 27 mEq/L (ref 19–32)
Calcium: 8.9 mg/dL (ref 8.4–10.5)
Creatinine, Ser: 1.8 mg/dL — ABNORMAL HIGH (ref 0.50–1.35)
GFR calc Af Amer: 48 mL/min — ABNORMAL LOW (ref >90)
GFR calc non Af Amer: 41 mL/min — ABNORMAL LOW (ref >90)
Sodium: 138 mEq/L (ref 135–145)
Total Protein: 6.6 g/dL (ref 6.0–8.3)

## 2013-05-18 LAB — CBC WITH DIFFERENTIAL/PLATELET
Basophils Relative: 1 % (ref 0–1)
Eosinophils Absolute: 0.3 10*3/uL (ref 0.0–0.7)
Eosinophils Relative: 7 % — ABNORMAL HIGH (ref 0–5)
MCH: 29.5 pg (ref 26.0–34.0)
MCHC: 32.8 g/dL (ref 30.0–36.0)
MCV: 89.7 fL (ref 78.0–100.0)
Neutrophils Relative %: 50 % (ref 43–77)
Platelets: 404 10*3/uL — ABNORMAL HIGH (ref 150–400)
RBC: 3.7 MIL/uL — ABNORMAL LOW (ref 4.22–5.81)
RDW: 15.3 % (ref 11.5–15.5)

## 2013-05-18 LAB — PHOSPHORUS: Phosphorus: 4.6 mg/dL (ref 2.3–4.6)

## 2013-05-18 LAB — PROTIME-INR: INR: 1.17 (ref 0.00–1.49)

## 2013-05-18 LAB — MAGNESIUM: Magnesium: 2 mg/dL (ref 1.5–2.5)

## 2013-05-18 MED ORDER — FAMOTIDINE 20 MG PO TABS
20.0000 mg | ORAL_TABLET | Freq: Every day | ORAL | Status: DC
  Administered 2013-05-18 – 2013-05-19 (×2): 20 mg via ORAL
  Filled 2013-05-18 (×4): qty 1

## 2013-05-18 MED ORDER — INFLUENZA VAC SPLIT QUAD 0.5 ML IM SUSP
0.5000 mL | INTRAMUSCULAR | Status: AC
  Administered 2013-05-19: 0.5 mL via INTRAMUSCULAR
  Filled 2013-05-18: qty 0.5

## 2013-05-18 MED ORDER — WARFARIN SODIUM 7.5 MG PO TABS
7.5000 mg | ORAL_TABLET | Freq: Once | ORAL | Status: AC
  Administered 2013-05-18: 7.5 mg via ORAL
  Filled 2013-05-18 (×2): qty 1

## 2013-05-18 MED ORDER — PNEUMOCOCCAL VAC POLYVALENT 25 MCG/0.5ML IJ INJ
0.5000 mL | INJECTION | INTRAMUSCULAR | Status: AC
  Administered 2013-05-19: 0.5 mL via INTRAMUSCULAR
  Filled 2013-05-18: qty 0.5

## 2013-05-18 NOTE — Progress Notes (Signed)
dcd left AC iv,leaking. Cath intact upon removal. Noticed area swollen and painful per patient . Warm compress applied. Instructed patient to keep arm elevated. Tammy , CCm NP made aware.

## 2013-05-18 NOTE — Progress Notes (Signed)
Admit: 05/16/2013 LOS: 2  68M w/ AKI 2/2 CIN (exposure 10/01) + Hypovolemia (N/V, D) on ARB and KCl.    Subjective:  Cdiff negative Good UOP SCr improving Diarrhea persists   10/11 0701 - 10/12 0700 In: 2574 [I.V.:2574] Out: 1910 [Urine:1175; Stool:735]  Filed Weights   05/17/13 0400 05/18/13 0500  Weight: 83.462 kg (184 lb) 83.7 kg (184 lb 8.4 oz)    Current meds: reviewed  Current Labs: reviewed    Physical Exam:  Blood pressure 99/55, pulse 94, temperature 98.1 F (36.7 C), temperature source Oral, resp. rate 16, weight 83.7 kg (184 lb 8.4 oz), SpO2 99.00%. NAD, resting calmly, cooperative and interactive  CV: RRR, nl s1s2, no rub LUNGS: CTAB. Nl wob EXT: no LEE ABD: bandaged, soft, nontender GU: nl genitalia, no foley NEURO: nonfocal ENT: NCAT, HOH NECK: R IJ VasCath noted   Assessment/Plan 1. AKI 2/2 Contrast Nephropathy (CT 10/01) + Hypovolemia (diarrhea, N/V) on ARB and KCl: Rec HD night of 05/16/13 with normalized K, stable K since.  BUN and acid base status stable.  Vol status stable.  Appears to be recovering.   2. Hyperkalemia: had peaked Ts, failed to respond to medical mgnt on 10/10.  As above 3. Metabolic acidosis: resolved  [name] Heck MD 05/18/2013, 7:55 AM   Recent Labs Lab 05/17/13 0500 05/17/13 1113 05/17/13 1359  NA 135 134* 134*  K 5.7* 5.7* 5.3*  CL 95* 94* 96  CO2 30 30 27   GLUCOSE 118* 111* 107*  BUN 30* 30* 30*  CREATININE 2.98* 2.70* 2.50*  CALCIUM 9.0 9.1 8.8    Recent Labs Lab 05/16/13 1325  05/16/13 1958 05/17/13 0500 05/18/13 0720  WBC 7.4  --  8.5 6.5 4.8  NEUTROABS 5.2  --   --  4.3 2.4  HGB 14.5  < > 13.6 12.0* 10.9*  HCT 42.9  < > 39.3 34.9* 33.2*  MCV 89.4  --  88.5 87.3 89.7  PLT 685*  --  583* 482* 404*  < > = values in this interval not displayed.  Current Facility-Administered Medications  Medication Dose Route Frequency Provider Last Rate Last Dose  . 0.9 %  sodium chloride infusion  250 mL  Intravenous PRN Kalman Shan, MD      . 0.9 %  sodium chloride infusion  100 mL Intravenous PRN Arita Miss, MD      . 0.9 %  sodium chloride infusion  100 mL Intravenous PRN Arita Miss, MD      . 0.9 %  sodium chloride infusion   Intravenous Continuous Julio Sicks, NP 75 mL/hr at 05/18/13 0510    . acetaminophen (TYLENOL) tablet 650 mg  650 mg Oral Q6H PRN Renae Fickle, MD   650 mg at 05/17/13 0042   Or  . acetaminophen (TYLENOL) suppository 650 mg  650 mg Rectal Q6H PRN Renae Fickle, MD      . calcium gluconate 1 g in sodium chloride 0.9 % 100 mL IVPB  1 g Intravenous Once Candyce Churn, MD      . enoxaparin (LOVENOX) injection 85 mg  85 mg Subcutaneous Q12H [address] [place], RPH   85 mg at 05/18/13 0400  . heparin injection 1,000 Units  1,000 Units Dialysis PRN Arita Miss, MD      . heparin injection 8,000 Units  8,000 Units Dialysis PRN Arita Miss, MD      . heparin lock flush 100 unit/mL  500 Units Intravenous Once  Maree Krabbe, MD      . labetalol (NORMODYNE,TRANDATE) injection 10 mg  10 mg Intravenous Q2H PRN Renae Fickle, MD      . oxyCODONE-acetaminophen (PERCOCET/ROXICET) 5-325 MG per tablet 1-2 tablet  1-2 tablet Oral Q6H PRN Renae Fickle, MD   1 tablet at 05/17/13 0902  . pantoprazole (PROTONIX) injection 40 mg  40 mg Intravenous QHS Kalman Shan, MD   40 mg at 05/17/13 2124  . sodium bicarbonate injection 50 mEq  50 mEq Intravenous Once Renae Fickle, MD      . sodium chloride 0.9 % bolus 2,000 mL  2,000 mL Intravenous Once Kalman Shan, MD      . sodium chloride 0.9 % injection 3 mL  3 mL Intravenous Q12H Renae Fickle, MD   3 mL at 05/17/13 2124  . sodium polystyrene (KAYEXALATE) 15 GM/60ML suspension 45 g  45 g Oral Once Maree Krabbe, MD      . Warfarin - Pharmacist Dosing Inpatient   Does not apply q1800 Drake Leach Rumbarger, [facility]

## 2013-05-18 NOTE — Progress Notes (Signed)
Report given to [employer] on [address]

## 2013-05-18 NOTE — Progress Notes (Signed)
Rick Edwards is a 57 y.o. male patient who transferred  from med ICU awake, alert  & orientated  X 3, Full Code, VSS - Blood pressure 110/64, pulse 105, temperature 98.2 F (36.8 C), temperature source Oral, resp. rate 21, weight 83.7 kg (184 lb 8.4 oz), SpO2 96.00%., , no c/o chest pain, no distress noted. Tele # [id number] placed and pt is currently running:sinus tachycardia.   IV site WDL: antecubital right, HD right neck with pigtail condition patent and no redness with a transparent dsg that's clean dry and intact.  Allergies:   Allergies  Allergen Reactions  . Mesalamine     REACTION: Rash     Past Medical History  Diagnosis Date  . Crohn's disease     tx. Imuran  . Perianal abscess 2001    s/p right hemicolectomy, and drainage of retroperitoneal abcess 2003  . Hypertension   . Hearing loss     wears hearing aid left ear; birthed with hearing loss  . GI bleed 6/11    w/ normal EGD 6/11, 3 PRBCs   . Anemia     anemia thrombocytopenia, saw Hematology 2011, can not r/o myeloproliferative d/c  . Transfusion history     last 8'13  . Ileostomy in place 04-11-13    04-18-13 ileostomy to be taken down.  Marland Kitchen HOH (hard of hearing) 05/16/2013  . Antithrombin III deficiency 05/16/2013    On coumadin for this    Pt orientation to unit, room and routine. SR up x 2, fall risk assessment complete with Patient and family verbalizing understanding of risks associated with falls. Pt verbalizes an understanding of how to use the call bell and to call for help before getting out of bed.  Skin, clean-dry- intact without evidence of bruising, or skin tears.   No evidence of skin break down noted on exam. Some nonpitting edema left arm from IV infiltrate     Will cont to monitor and assist as needed.  Cindra Eves, RN 05/18/2013 1:04 PM

## 2013-05-18 NOTE — Evaluation (Signed)
Physical Therapy Evaluation Patient Details Name: Rick Edwards MRN: [id number] DOB: 1956/05/28 Today's Date: 05/18/2013 Time: 4098-1191 PT Time Calculation (min): 21 min  PT Assessment / Plan / Recommendation History of Present Illness  57 yo w/ hx of Crohn's readmitted 10/10 with acute renal failure and hyperkalemia requiring HD . Recent discharge 10/3 after intraabdominal abscess post op from ileostomy take down , discharged home on wound vac. Pt w/ diarrhea and poor intake prior to admission.  Clinical Impression  Patient presents with problems listed below.  Will benefit from acute PT to maximize independence prior to discharge to parent's home.    PT Assessment  Patient needs continued PT services    Follow Up Recommendations  Home health PT;Supervision/Assistance - 24 hour    Does the patient have the potential to tolerate intense rehabilitation      Barriers to Discharge        Equipment Recommendations  None recommended by PT    Recommendations for Other Services     Frequency Min 3X/week    Precautions / Restrictions Precautions Precautions: Fall Restrictions Weight Bearing Restrictions: No   Pertinent Vitals/Pain       Mobility  Bed Mobility Bed Mobility: Rolling Right;Right Sidelying to Sit;Sitting - Scoot to Edge of Bed Rolling Right: 4: Min guard;With rail Right Sidelying to Sit: 4: Min assist;With rails;HOB elevated Sitting - Scoot to Edge of Bed: 4: Min guard Details for Bed Mobility Assistance: Verbal cues for technique.  Assist to raise trunk to sitting.  Patient with good sitting balance EOB. Transfers Transfers: Sit to Stand;Stand to Sit Sit to Stand: 4: Min assist;With upper extremity assist;From bed Stand to Sit: 4: Min assist;With upper extremity assist;With armrests;To chair/3-in-1 Details for Transfer Assistance: Verbal cues for hand placement.  Assist for balance/safety. Ambulation/Gait Ambulation/Gait Assistance: 4: Min assist (+1  for lines) Ambulation Distance (Feet): 110 Feet Assistive device: Rolling walker Ambulation/Gait Assistance Details: Verbal cues for safe use of RW.  Patient with good gait sequence.  Cues to stand upright. Gait Pattern: Step-through pattern;Decreased stride length;Trunk flexed Gait velocity: Slow gait speed    Exercises     PT Diagnosis: Difficulty walking;Generalized weakness  PT Problem List: Decreased strength;Decreased balance;Decreased mobility;Decreased knowledge of use of DME PT Treatment Interventions: DME instruction;Gait training;Stair training;Functional mobility training;Patient/family education;Balance training     PT Goals(Current goals can be found in the care plan section) Acute Rehab PT Goals Patient Stated Goal: To return to parent's home PT Goal Formulation: With patient Time For Goal Achievement: 05/25/13 Potential to Achieve Goals: Good  Visit Information  Last PT Received On: 05/18/13 Assistance Needed: +1 History of Present Illness: 57 yo w/ hx of Crohn's readmitted 10/10 with acute renal failure and hyperkalemia requiring HD . Recent discharge 10/3 after intraabdominal abscess post op from ileostomy take down , discharged home on wound vac. Pt w/ diarrhea and poor intake prior to admission.       Prior Functioning  Home Living Family/patient expects to be discharged to:: Private residence Living Arrangements: Parent Available Help at Discharge: Family;Available 24 hours/day Type of Home: House Home Access: Stairs to enter [employer] of Steps: 4 Entrance Stairs-Rails: Right;Left Home Layout: One level Home Equipment: Walker - 2 wheels;Bedside commode;Shower seat Prior Function Level of Independence: Independent with assistive device(s);Needs assistance Gait / Transfers Assistance Needed: Assist managing VAC pta. ADL's / Homemaking Assistance Needed: Assist for meal prep, housekeeping, bathing. Communication / Swallowing Assistance Needed:  HOH - wears hearing aid Communication Communication:  HOH (wears hearing aid)    Cognition  Cognition Arousal/Alertness: Awake/alert Behavior During Therapy: WFL for tasks assessed/performed Overall Cognitive Status: Within Functional Limits for tasks assessed    Extremity/Trunk Assessment Upper Extremity Assessment Upper Extremity Assessment: Generalized weakness Lower Extremity Assessment Lower Extremity Assessment: Generalized weakness Cervical / Trunk Assessment Cervical / Trunk Assessment: Other exceptions Cervical / Trunk Exceptions: Abdominal dressing   Balance Balance Balance Assessed: Yes Static Sitting Balance Static Sitting - Balance Support: No upper extremity supported;Feet supported Static Sitting - Level of Assistance: 5: Stand by assistance Static Sitting - Comment/# of Minutes: 5 Static Standing Balance Static Standing - Balance Support: Bilateral upper extremity supported Static Standing - Level of Assistance: 5: Stand by assistance Static Standing - Comment/# of Minutes: 2  End of Session PT - End of Session Equipment Utilized During Treatment: Gait belt Activity Tolerance: Patient tolerated treatment well Patient left: in chair;with call bell/phone within reach Nurse Communication: Mobility status (IV bleeding on bed as PT entered room.  RN notified)  GP     Vena Austria 05/18/2013, 10:14 AM [name], [facility] Acute Rehab Services Pager [phone]

## 2013-05-18 NOTE — Progress Notes (Signed)
ANTICOAGULATION CONSULT NOTE - Follow Up Consult  Pharmacy Consult for Warfarin + Lovenox Indication: ATIII Deficiency, Hx arterial thrombi  Allergies  Allergen Reactions  . Mesalamine     REACTION: Rash   Patient Measurements: Weight: 184 lb 8.4 oz (83.7 kg) Vital Signs: Temp: 98.2 F (36.8 C) (10/12 0900) Temp src: Oral (10/12 0427) BP: 110/64 mmHg (10/12 0900) Pulse Rate: 105 (10/12 1100) Labs:  Recent Labs  05/16/13 1325  05/16/13 1958 05/17/13 0147 05/17/13 0500 05/17/13 0930 05/17/13 0947 05/17/13 1113 05/17/13 1200 05/17/13 1359 05/18/13 0720  HGB 14.5  < > 13.6  --  12.0*  --   --   --   --   --  10.9*  HCT 42.9  < > 39.3  --  34.9*  --   --   --   --   --  33.2*  PLT 685*  --  583*  --  482*  --   --   --   --   --  404*  APTT 37  --   --   --   --   --   --   --   --   --   --   LABPROT 14.3  --   --   --   --  13.7  --   --   --   --  14.7  INR 1.13  --   --   --   --  1.07  --   --   --   --  1.17  CREATININE HEMOLYZED SPECIMEN, RESULTS MAY BE AFFECTED  < > 4.57*  --  2.98*  --   --  2.70*  --  2.50* 1.80*  TROPONINI  --   < > <0.30 0.39*  --   --  <0.30  --  <0.30  --   --   < > = values in this interval not displayed.  The CrCl is unknown because both a height and weight (above a minimum accepted value) are required for this calculation.  Assessment: 72 YOM with antithrombin III deficiency and history of arterial thrombi on warfarin and Lovenox overlap day #2. INR today is 1.17. SCr is now spontaneously improving at 1.80, estimated CrCl ~55 ml/min. UOP ~0.6 cc/kg/hr last 24 hours. Patient is on starting a renal diet today.   Goal of Therapy:  INR 2-3 Anti-Xa level 0.6-1.2 units/ml 4hrs after LMWH dose given if needed Monitor platelets by anticoagulation protocol: Yes   Plan:  1. Warfarin 7.5mg  po x1 again tonight.  2. Continue Lovenox 85mg  SQ q12 hours. Discontinue when INR >2. 3. Continue to monitor CBC q72h and PT/INR daily.   Link Snuffer,  PharmD, BCPS Clinical Pharmacist [phone] 05/18/2013,11:42 AM

## 2013-05-18 NOTE — Progress Notes (Signed)
Subjective: About to get up with therapies  Objective: Vital signs in last 24 hours: Temp:  [98.1 F (36.7 C)-98.5 F (36.9 C)] 98.1 F (36.7 C) (10/12 0427) Pulse Rate:  [92-109] 98 (10/12 0900) Resp:  [13-24] 21 (10/12 0900) BP: (79-110)/(48-67) 110/64 mmHg (10/12 0900) SpO2:  [96 %-99 %] 97 % (10/12 0900) Weight:  [83.7 kg (184 lb 8.4 oz)] 83.7 kg (184 lb 8.4 oz) (10/12 0500) Last BM Date: 05/17/13  Intake/Output from previous day: 10/11 0701 - 10/12 0700 In: 2574 [I.V.:2574] Out: 1910 [Urine:1175; Stool:735] Intake/Output this shift: Total I/O In: 150 [I.V.:150] Out: -   General appearance: alert Resp: clear to auscultation bilaterally Cardio: S1, S2 normal GI: soft, mild drainage from wound, wet to dry Neurologic: Mental status: Alert, oriented, thought content appropriate  Lab Results:   Recent Labs  05/17/13 0500 05/18/13 0720  WBC 6.5 4.8  HGB 12.0* 10.9*  HCT 34.9* 33.2*  PLT 482* 404*   BMET  Recent Labs  05/17/13 1359 05/18/13 0720  NA 134* 138  K 5.3* 4.7  CL 96 101  CO2 27 27  GLUCOSE 107* 98  BUN 30* 27*  CREATININE 2.50* 1.80*  CALCIUM 8.8 8.9   PT/INR  Recent Labs  05/17/13 0930 05/18/13 0720  LABPROT 13.7 14.7  INR 1.07 1.17   ABG  Recent Labs  05/17/13 0430  PHART 7.506*  HCO3 29.0*    Studies/Results: US Renal  05/17/2013   *RADIOLOGY REPORT*  Clinical Data:  Acute renal failure.  RENAL/URINARY TRACT ULTRASOUND COMPLETE  Comparison:  CT of the abdomen and pelvis performed 05/08/2013  Findings:  Right Kidney:  The right kidney measures 11.7 cm in length.  The kidney demonstrates normal size, echogenicity and configuration. No significant cortical thinning is seen.  No hydronephrosis or calcification is identified.  No masses are seen.  Left Kidney:  The left kidney measures 11.9 cm in length.  The kidney demonstrates normal size, echogenicity and configuration. No significant cortical thinning is seen.  No  hydronephrosis or calcification is identified.  A small cyst is noted at the upper pole of the left kidney, measuring 1.5 cm.  Bladder:  The bladder is decompressed and not well assessed.  Diffuse fatty infiltration is noted within the liver.  IMPRESSION:  1.  No evidence of hydronephrosis; renal echogenicity within normal limits. 2.  Small left renal cyst seen. 3.  Diffuse fatty infiltration within the liver.   Original Report Authenticated By: Tonia Ghent, M.D.   Dg Chest Port 1 View  05/16/2013   *RADIOLOGY REPORT*  Clinical Data: Placement of right-sided hemodialysis catheter.  PORTABLE CHEST - 1 VIEW  Comparison: Chest radiograph performed earlier today at 05:26 p.m.  Findings: The patient's right-sided dual lumen catheter is noted ending about the distal SVC.  The lungs are relatively well expanded.  Bibasilar atelectasis or scarring is again noted.  No pleural effusion or pneumothorax is seen.  The cardiomediastinal silhouette is mildly enlarged.  No acute osseous abnormalities are identified.  IMPRESSION:  1.  Right-sided dual lumen catheter noted ending about the distal SVC. 2.  Bibasilar atelectasis or scarring again noted; lungs otherwise clear. 3.  Mild cardiomegaly.   Original Report Authenticated By: Tonia Ghent, M.D.   Dg Abd Acute W/chest  05/16/2013   CLINICAL DATA:  Status post colectomy and ileostomy reversal. Abdominal infection. Dizziness, weakness and nausea. Firm and distended abdomen.  EXAM: ACUTE ABDOMEN SERIES (ABDOMEN 2 VIEW & CHEST 1 VIEW)  COMPARISON:  Multiple  priors, most recently CT of the abdomen and pelvis 05/08/2013.  FINDINGS: Lung volumes are low and there are linear opacities in lung bases bilaterally, most compatible with subsegmental atelectasis. No definite consolidative airspace disease. No pleural effusions. No evidence of pulmonary edema. Heart size is normal. Mediastinal contours are unremarkable.  There are numerous dilated loops of small bowel throughout the  abdomen, measuring up to approximately 5.4 cm in diameter. There appears to be some distal rectal gas. A suture line is noted in the central pelvis, likely a side of ileorectal anastomosis. Surgical clips also project over the right side of the abdomen and skin staples project over the low anatomic pelvis. No frank pneumoperitoneum is identified on the left lateral decubitus view. However, there are multiple air-fluid levels within the dilated loops of small bowel.  IMPRESSION: *The appearance of the abdomen is suggestive of a potential small bowel obstruction. However, given the appearance on the prior CT scan and the patient's postoperative state, the possibility of an ileus warrants consider consideration. Clinical correlation is recommended. *No pneumoperitoneum. *Low lung volumes with bibasilar subsegmental atelectasis.   Electronically Signed   By: Trudie Reed M.D.   On: 05/16/2013 17:49    Anti-infectives: Anti-infectives   None      Assessment/Plan: S/P ileostomy takedown - OK for renal diet, C-diff neg AKI - improving Mobilizing with therapies  LOS: 2 days    Montey Ebel E 05/18/2013

## 2013-05-18 NOTE — Progress Notes (Signed)
PULMONARY  / CRITICAL CARE MEDICINE  Name: Rick Edwards MRN: [id number] DOB: 1956-03-18 PCP Sanda Linger, MD    ADMISSION DATE:  05/16/2013 CONSULTATION DATE:  05/16/2013   REFERRING MD :  Owens Loffler OF [facility] PRIMARY SERVICE: [facility] -> PCCM  CHIEF COMPLAINT:  Acute renal failure/Hyperkalemia   BRIEF: 57 yo w/ hx of Crohn's readmitted 10/10 with acute renal failure and hyperkalemia requiring HD . Recent discharge 10/3 after intraabdominal abscess post op from ileostomy take down , discharged home on wound vac. Pt w/ diarrhea and poor intake prior to admission .  Last ABD CT w/ contrast on 10/2.    SIGNIFICANT EVENTS / STUDIES:  Admit 05/16/2013 HD 10/10  Renal US 10/10 >no hydronephrosis AXR 05/16/13  - Possible ileus   LINES / TUBES: HD Cath -  05/16/2013 and dialysis   CULTURES: 10/10 MRSA PCR - neg 10/10C.Diff >NEG 10/10 UC > NEG     ANTIBIOTICS: Anti-infectives   None        SUBJECTIVE:   05/18/13: Improving creat and K. Continues w/ diarrhea x4; CCS thinks related to recent surgery. NO HD since 05/16/13     VITAL SIGNS: Temp:  [98.1 F (36.7 C)-98.5 F (36.9 C)] 98.1 F (36.7 C) (10/12 0427) Pulse Rate:  [92-109] 98 (10/12 0900) Resp:  [13-24] 21 (10/12 0900) BP: (79-110)/(48-67) 110/64 mmHg (10/12 0900) SpO2:  [96 %-99 %] 97 % (10/12 0900) Weight:  [83.7 kg (184 lb 8.4 oz)] 83.7 kg (184 lb 8.4 oz) (10/12 0500) HEMODYNAMICS:   VENTILATOR SETTINGS:   INTAKE / OUTPUT: Intake/Output     10/11 0701 - 10/12 0700 10/12 0701 - 10/13 0700   P.O.     I.V. (mL/kg) 2574 (30.8) 150 (1.8)   Total Intake(mL/kg) 2574 (30.8) 150 (1.8)   Urine (mL/kg/hr) 1175 (0.6)    Other     Stool 735 (0.4)    Total Output 1910     Net +664 +150          PHYSICAL EXAMINATION: General:  Looks stronger Neuro:  AXOX3. HOH. . Moves all 4s. Standing with PT HEENT:  Supple neck. Mucosa moist  Cardiovascular:  Tacycardic. SInus. Normal heart soudns Lungs:  CTA  bilaterally Abdomen:  Obese, soft, Wound dressing + distended  Musculoskeletal:  No cyanosis, No clubbing,. tr edema Skin:  no rash , mid abd wound dressing   LABS: PULMONARY  Recent Labs Lab 05/16/13 1757 05/17/13 0430  PHART  --  7.506*  PCO2ART  --  36.9  PO2ART  --  78.4*  HCO3  --  29.0*  TCO2 18 30.1  O2SAT  --  95.9    CBC  Recent Labs Lab 05/16/13 1958 05/17/13 0500 05/18/13 0720  HGB 13.6 12.0* 10.9*  HCT 39.3 34.9* 33.2*  WBC 8.5 6.5 4.8  PLT 583* 482* 404*    COAGULATION  Recent Labs Lab 05/16/13 1325 05/17/13 0930 05/18/13 0720  INR 1.13 1.07 1.17    CARDIAC    Recent Labs Lab 05/16/13 1750 05/16/13 1958 05/17/13 0147 05/17/13 0947 05/17/13 1200  TROPONINI <0.30 <0.30 0.39* <0.30 <0.30   No results found for this basename: PROBNP,  in the last 168 hours   CHEMISTRY  Recent Labs Lab 05/16/13 1958  05/17/13 0500 05/17/13 1113 05/17/13 1359 05/18/13 0720  NA 134*  --  135 134* 134* 138  K 7.2*  < > 5.7* 5.7* 5.3* 4.7  CL 100  --  95* 94* 96 101  CO2 18*  --  30 30 27 27   GLUCOSE 115*  --  118* 111* 107* 98  BUN 57*  --  30* 30* 30* 27*  CREATININE 4.57*  --  2.98* 2.70* 2.50* 1.80*  CALCIUM 9.6  --  9.0 9.1 8.8 8.9  MG  --   --  2.0  --   --  2.0  PHOS  --   --   --   --   --  4.6  < > = values in this interval not displayed. The CrCl is unknown because both a height and weight (above a minimum accepted value) are required for this calculation.   LIVER  Recent Labs Lab 05/16/13 1325 05/16/13 1500 05/17/13 0500 05/17/13 0930 05/18/13 0720  AST HEMOLYZED SPECIMEN, RESULTS MAY BE AFFECTED 22 18  --  20  ALT HEMOLYZED SPECIMEN, RESULTS MAY BE AFFECTED 56* 49  --  47  ALKPHOS HEMOLYZED SPECIMEN, RESULTS MAY BE AFFECTED 135* 117  --  111  BILITOT HEMOLYZED SPECIMEN, RESULTS MAY BE AFFECTED 0.5 1.0  --  0.7  PROT HEMOLYZED SPECIMEN, RESULTS MAY BE AFFECTED 7.6 6.8  --  6.6  ALBUMIN HEMOLYZED SPECIMEN, RESULTS MAY BE  AFFECTED 3.5 3.2*  --  3.0*  INR 1.13  --   --  1.07 1.17     INFECTIOUS  Recent Labs Lab 05/16/13 1325  LATICACIDVEN 1.3     ENDOCRINE CBG (last 3)   Recent Labs  05/16/13 1704  GLUCAP 102*   IMAGING x48h  US Renal  05/17/2013   *RADIOLOGY REPORT*  Clinical Data:  Acute renal failure.  RENAL/URINARY TRACT ULTRASOUND COMPLETE  Comparison:  CT of the abdomen and pelvis performed 05/08/2013  Findings:  Right Kidney:  The right kidney measures 11.7 cm in length.  The kidney demonstrates normal size, echogenicity and configuration. No significant cortical thinning is seen.  No hydronephrosis or calcification is identified.  No masses are seen.  Left Kidney:  The left kidney measures 11.9 cm in length.  The kidney demonstrates normal size, echogenicity and configuration. No significant cortical thinning is seen.  No hydronephrosis or calcification is identified.  A small cyst is noted at the upper pole of the left kidney, measuring 1.5 cm.  Bladder:  The bladder is decompressed and not well assessed.  Diffuse fatty infiltration is noted within the liver.  IMPRESSION:  1.  No evidence of hydronephrosis; renal echogenicity within normal limits. 2.  Small left renal cyst seen. 3.  Diffuse fatty infiltration within the liver.   Original Report Authenticated By: Tonia Ghent, M.D.   Dg Chest Port 1 View  05/16/2013   *RADIOLOGY REPORT*  Clinical Data: Placement of right-sided hemodialysis catheter.  PORTABLE CHEST - 1 VIEW  Comparison: Chest radiograph performed earlier today at 05:26 p.m.  Findings: The patient's right-sided dual lumen catheter is noted ending about the distal SVC.  The lungs are relatively well expanded.  Bibasilar atelectasis or scarring is again noted.  No pleural effusion or pneumothorax is seen.  The cardiomediastinal silhouette is mildly enlarged.  No acute osseous abnormalities are identified.  IMPRESSION:  1.  Right-sided dual lumen catheter noted ending about the  distal SVC. 2.  Bibasilar atelectasis or scarring again noted; lungs otherwise clear. 3.  Mild cardiomegaly.   Original Report Authenticated By: Tonia Ghent, M.D.   Dg Abd Acute W/chest  05/16/2013   CLINICAL DATA:  Status post colectomy and ileostomy reversal. Abdominal infection. Dizziness, weakness and nausea. Firm and distended abdomen.  EXAM: ACUTE  ABDOMEN SERIES (ABDOMEN 2 VIEW & CHEST 1 VIEW)  COMPARISON:  Multiple priors, most recently CT of the abdomen and pelvis 05/08/2013.  FINDINGS: Lung volumes are low and there are linear opacities in lung bases bilaterally, most compatible with subsegmental atelectasis. No definite consolidative airspace disease. No pleural effusions. No evidence of pulmonary edema. Heart size is normal. Mediastinal contours are unremarkable.  There are numerous dilated loops of small bowel throughout the abdomen, measuring up to approximately 5.4 cm in diameter. There appears to be some distal rectal gas. A suture line is noted in the central pelvis, likely a side of ileorectal anastomosis. Surgical clips also project over the right side of the abdomen and skin staples project over the low anatomic pelvis. No frank pneumoperitoneum is identified on the left lateral decubitus view. However, there are multiple air-fluid levels within the dilated loops of small bowel.  IMPRESSION: *The appearance of the abdomen is suggestive of a potential small bowel obstruction. However, given the appearance on the prior CT scan and the patient's postoperative state, the possibility of an ileus warrants consider consideration. Clinical correlation is recommended. *No pneumoperitoneum. *Low lung volumes with bibasilar subsegmental atelectasis.   Electronically Signed   By: Trudie Reed M.D.   On: 05/16/2013 17:49       ASSESSMENT / PLAN:  PULMONARY A:maintaing airway w/ adequate sats  P:   Titrate O2 for sat >90%  CARDIOVASCULAR A:  Hyperkalemia ekg changes 05/16/13 - not seen  on monitor 05/18/13. 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 S/p HD x 1 on 05/16/13  10/12  Scr tr  down , K improved. Acidosis resolved  P:   Per renal   GASTROINTESTINAL A:  crohins post op  05/17/13: C Diff negative  1012!4 - diarrhea + due to post oper PER CCs  P:   ccs following  Change iv protonix to pepcid    HEMATOLOGIC A:  Thrombocytosis.  / AT3 def on chronic coumadin   P:  Lovenox -> Coumadin per pharm protocol  Dc lovenox once INR > 2  INFECTIOUS A:  No evidence of infection but was on immunsuppressants P:   Monitor Restart immunsuppresants when able after ? Talking to rheum/pcp/GI ([facility] to sort this out)  ENDOCRINE A:  At risk for relative AI   P:   monitor  NEUROLOGIC A:  intact P:   monitor   GLOBAL 05/18/13: no famil at bedside. Ambualtee in ICU. Move to tele. [facility] pick up 05/19/13 and PCCM off. D/w DR Butler Denmark of [facility]   Dr. Kalman Shan, M.D., [facility].C.P Pulmonary and Critical Care Medicine Staff Physician [facility] [place] [facility] Pager: [phone], If no answer or between  15:00h - 7:00h: call [phone]  05/18/2013 10:10 AM

## 2013-05-19 DIAGNOSIS — I743 Embolism and thrombosis of arteries of the lower extremities: Secondary | ICD-10-CM

## 2013-05-19 LAB — CBC WITH DIFFERENTIAL/PLATELET
Eosinophils Absolute: 0.2 10*3/uL (ref 0.0–0.7)
Eosinophils Relative: 3 % (ref 0–5)
HCT: 34.7 % — ABNORMAL LOW (ref 39.0–52.0)
Hemoglobin: 11.7 g/dL — ABNORMAL LOW (ref 13.0–17.0)
Lymphs Abs: 1.7 10*3/uL (ref 0.7–4.0)
MCH: 30.5 pg (ref 26.0–34.0)
MCV: 90.4 fL (ref 78.0–100.0)
Monocytes Absolute: 0.5 10*3/uL (ref 0.1–1.0)
Monocytes Relative: 11 % (ref 3–12)
RBC: 3.84 MIL/uL — ABNORMAL LOW (ref 4.22–5.81)

## 2013-05-19 LAB — BASIC METABOLIC PANEL
CO2: 23 mEq/L (ref 19–32)
Chloride: 100 mEq/L (ref 96–112)
Glucose, Bld: 104 mg/dL — ABNORMAL HIGH (ref 70–99)
Potassium: 4 mEq/L (ref 3.5–5.1)
Sodium: 135 mEq/L (ref 135–145)

## 2013-05-19 LAB — PROTIME-INR: Prothrombin Time: 15.2 seconds (ref 11.6–15.2)

## 2013-05-19 MED ORDER — WARFARIN SODIUM 7.5 MG PO TABS
7.5000 mg | ORAL_TABLET | Freq: Once | ORAL | Status: AC
  Administered 2013-05-19: 18:00:00 7.5 mg via ORAL
  Filled 2013-05-19: qty 1

## 2013-05-19 MED ORDER — LOPERAMIDE HCL 2 MG PO CAPS
2.0000 mg | ORAL_CAPSULE | ORAL | Status: DC | PRN
  Administered 2013-05-19: 16:00:00 2 mg via ORAL
  Filled 2013-05-19: qty 1

## 2013-05-19 NOTE — Progress Notes (Signed)
Subjective:  I feel great Tolerating po well Still getting IVF at 75/hour Note that discharge being considered for tomorrow  Objective Vital signs in last 24 hours: Filed Vitals:   05/18/13 1210 05/18/13 1323 05/18/13 2126 05/19/13 0552  BP:  125/78 116/75 129/81  Pulse:  101 92 87  Temp: 98.2 F (36.8 C) 99.1 F (37.3 C) 98.8 F (37.1 C) 98.7 F (37.1 C)  TempSrc: Oral Oral Oral Oral  Resp:  18 17 17   Height:  5\' 9"  (1.753 m)    Weight:  80.287 kg (177 lb)  80.9 kg (178 lb 5.6 oz)  SpO2:  97% 97% 98%   Weight change: -3.413 kg (-7 lb 8.4 oz)  Intake/Output Summary (Last 24 hours) at 05/19/13 1113 Last data filed at 05/19/13 0909  Gross per 24 hour  Intake 1772.5 ml  Output    950 ml  Net  822.5 ml   Physical Exam:  Blood pressure 129/81, pulse 87, temperature 98.7 F (37.1 C), temperature source Oral, resp. rate 17, height 5\' 9"  (1.753 m), weight 80.9 kg (178 lb 5.6 oz), SpO2 98.00%. NAD, resting calmly, cooperative and interactive Up in the chair CV: RRR, nl s1s2, no rub  LUNGS: CTAB. Nl wob  ABD: bandaged, soft, nontender  GU: nl genitalia, no foley  NEURO: nonfocal  NECK: R IJ VasCath in place  Recent Labs Lab 05/16/13 1750 05/16/13 1757 05/16/13 1958 05/17/13 0144 05/17/13 0500 05/17/13 1113 05/17/13 1359 05/18/13 0720 05/19/13 0520  NA 131* 133* 134*  --  135 134* 134* 138 135  K 6.4* 7.2* 7.2* 4.7 5.7* 5.7* 5.3* 4.7 4.0  CL 100 108 100  --  95* 94* 96 101 100  CO2 16*  --  18*  --  30 30 27 27 23   GLUCOSE 105* 104* 115*  --  118* 111* 107* 98 104*  BUN 62* 81* 57*  --  30* 30* 30* 27* 25*  CREATININE 5.09* 6.10* 4.57*  --  2.98* 2.70* 2.50* 1.80* 1.27  CALCIUM 9.5  --  9.6  --  9.0 9.1 8.8 8.9 8.9  PHOS  --   --   --   --   --   --   --  4.6  --    Recent Labs Lab 05/16/13 1500 05/17/13 0500 05/18/13 0720  AST 22 18 20   ALT 56* 49 47  ALKPHOS 135* 117 111  BILITOT 0.5 1.0 0.7  PROT 7.6 6.8 6.6  ALBUMIN 3.5 3.2* 3.0*   Recent Labs Lab  05/16/13 1325  05/16/13 1958 05/17/13 0500 05/18/13 0720 05/19/13 0520  WBC 7.4  --  8.5 6.5 4.8 4.9  NEUTROABS 5.2  --   --  4.3 2.4 2.5  HGB 14.5  < > 13.6 12.0* 10.9* 11.7*  HCT 42.9  < > 39.3 34.9* 33.2* 34.7*  MCV 89.4  --  88.5 87.3 89.7 90.4  PLT 685*  --  583* 482* 404* 399  < > = values in this interval not displayed.   Medications: . sodium chloride 75 mL/hr at 05/19/13 0457   . enoxaparin (LOVENOX) injection  85 mg Subcutaneous Q12H  . famotidine  20 mg Oral QHS  . heparin lock flush  500 Units Intravenous Once  . sodium bicarbonate  50 mEq Intravenous Once  . sodium chloride  2,000 mL Intravenous Once  . sodium chloride  3 mL Intravenous Q12H  . warfarin  7.5 mg Oral ONCE-1800  . Warfarin - Pharmacist Dosing  Inpatient   Does not apply q1800    I  have reviewed scheduled and prn medications.  Assessment/Plan  1. AKI 2/2 Contrast Nephropathy (CT 10/01) + hypovolemia (diarrhea, N/V) on ARB and KCl: Rec'd HD night of 05/16/13 with normalized K, stable K since. BUN and acid base status stable. Vol status stable. Clearly recovering with creatinine down to 1.27 today (baseline 0.7-0.8).  BP normal on no BP meds. I note that discharge is contemplated for tomorrow - will slow IVF to [facility] and have told patient to drink all he wants.  Remove HD cath. Change to regular diet.  2. Hyperkalemia: resolved with HD and has since remained stable  3. Metabolic acidosis: resolved  At this point, will reduce IVF, encourage po fluids, change from renal to regular diet, remove HD cath and sign off. Please call if further issues.  He does not need followup with Nephrology at discharge.   Camille Bal, MD [facility] [phone] pager 05/19/2013, 11:13 AM

## 2013-05-19 NOTE — Evaluation (Signed)
Occupational Therapy Evaluation Patient Details Name: Rick Edwards MRN: [id number] DOB: [birth year] Today's Date: 05/19/2013 Time: 4098-1191 OT Time Calculation (min): 25 min  OT Assessment / Plan / Recommendation History of present illness 57 yo w/ hx of Crohn's readmitted 10/10 with acute renal failure and hyperkalemia requiring HD . Recent discharge 10/3 after intraabdominal abscess post op from ileostomy take down , discharged home on wound vac. Pt w/ diarrhea and poor intake prior to admission.   Clinical Impression   Pt performing performing at a supervision for safety level in ADL and ADL transfers. Will be discharging home with parents' 24 hour supervision.  No further OT needs.    OT Assessment  Patient does not need any further OT services    Follow Up Recommendations  No OT follow up    Barriers to Discharge      Equipment Recommendations  None recommended by OT    Recommendations for Other Services    Frequency       Precautions / Restrictions Precautions Precautions: Fall Restrictions Weight Bearing Restrictions: No   Pertinent Vitals/Pain VSS, no pain reported    ADL  Eating/Feeding: Independent Where Assessed - Eating/Feeding: Chair Grooming: Supervision/safety;Wash/dry hands;Wash/dry face Where Assessed - Grooming: Unsupported standing Upper Body Bathing: Set up Where Assessed - Upper Body Bathing: Unsupported sitting Lower Body Bathing: Supervision/safety Where Assessed - Lower Body Bathing: Supported sit to stand Upper Body Dressing: Set up Where Assessed - Upper Body Dressing: Unsupported sitting Lower Body Dressing: Supervision/safety Where Assessed - Lower Body Dressing: Supported sit to stand Toilet Transfer: Supervision/safety Equipment Used: Gait belt Transfers/Ambulation Related to ADLs: supervision ADL Comments: Pt able to access his feet for bathing and dressing with ease.      OT Diagnosis:    OT Problem List:   OT Treatment  Interventions:     OT Goals(Current goals can be found in the care plan section) Acute Rehab OT Goals Patient Stated Goal: To return to parent's home  Visit Information  Last OT Received On: 05/19/13 Assistance Needed: +1 History of Present Illness: 57 yo w/ hx of Crohn's readmitted 10/10 with acute renal failure and hyperkalemia requiring HD . Recent discharge 10/3 after intraabdominal abscess post op from ileostomy take down , discharged home on wound vac. Pt w/ diarrhea and poor intake prior to admission.       Prior Functioning     Home Living Family/patient expects to be discharged to:: Private residence Living Arrangements: Parent Available Help at Discharge: Family;Available 24 hours/day Type of Home: House Home Access: Stairs to enter [employer] of Steps: 4 Entrance Stairs-Rails: Right;Left Home Layout: One level Home Equipment: Walker - 2 wheels;Bedside commode;Shower seat Additional Comments: pt with abdominal wound, will not be showering immediately Prior Function Level of Independence: Independent with assistive device(s);Needs assistance Gait / Transfers Assistance Needed: Assist managing VAC pta. ADL's / Homemaking Assistance Needed: Assist for meal prep, housekeeping, bathing. Communication / Swallowing Assistance Needed: HOH - wears hearing aid Communication Communication: HOH Dominant Hand: Right         Vision/Perception Vision - History Baseline Vision: Wears glasses all the time   Cognition  Cognition Arousal/Alertness: Awake/alert Behavior During Therapy: WFL for tasks assessed/performed Overall Cognitive Status: Within Functional Limits for tasks assessed    Extremity/Trunk Assessment Upper Extremity Assessment Upper Extremity Assessment: Overall WFL for tasks assessed Lower Extremity Assessment Lower Extremity Assessment: Defer to PT evaluation Cervical / Trunk Assessment Cervical / Trunk Exceptions: Abdominal dressing  Mobility Bed Mobility Bed Mobility: Sit to Supine Sitting - Scoot to Edge of Bed: 7: Independent Sit to Supine: 7: Independent Transfers Transfers: Sit to Stand;Stand to Sit Sit to Stand: With upper extremity assist;From chair/3-in-1;5: Supervision Stand to Sit: With upper extremity assist;To bed;5: Supervision Details for Transfer Assistance: supervision for transfer     Exercise     Balance Balance Balance Assessed: Yes Static Sitting Balance Static Sitting - Balance Support: No upper extremity supported;Feet supported Static Sitting - Level of Assistance: 7: Independent Static Standing Balance Static Standing - Balance Support: No upper extremity supported Static Standing - Level of Assistance: 5: Stand by assistance   End of Session OT - End of Session Activity Tolerance: Patient tolerated treatment well Patient left: in bed;with call bell/phone within reach;with family/visitor present  GO     Evern Bio 05/19/2013, 12:23 PM [phone]

## 2013-05-19 NOTE — Progress Notes (Signed)
PT Cancellation Note  Patient Details Name: [name] MRN: [id number] DOB: 03-04-1956   Cancelled Treatment:    Reason Eval/Treat Not Completed: Other (comment) (per IV team RN pt on bedrest x 1 hour after she removed the line from his neck.  PT to check back later as time allows.     Rollene Rotunda Nomar Broad, PT, DPT [phone]   05/19/2013, 3:23 PM

## 2013-05-19 NOTE — Progress Notes (Signed)
  Subjective: Pt doing well.  Tol PO  con't with dressing changes  Objective: Vital signs in last 24 hours: Temp:  [98.2 F (36.8 C)-99.1 F (37.3 C)] 98.7 F (37.1 C) (10/13 0552) Pulse Rate:  [87-105] 87 (10/13 0552) Resp:  [17-22] 17 (10/13 0552) BP: (116-129)/(75-81) 129/81 mmHg (10/13 0552) SpO2:  [94 %-98 %] 98 % (10/13 0552) Weight:  [177 lb (80.287 kg)-178 lb 5.6 oz (80.9 kg)] 178 lb 5.6 oz (80.9 kg) (10/13 0552) Last BM Date: 05/19/13  Intake/Output from previous day: 10/12 0701 - 10/13 0700 In: 1712.5 [P.O.:240; I.V.:1472.5] Out: 825 [Urine:725; Stool:100] Intake/Output this shift:     General appearance: alert and cooperative GI: soft, non-tender; bowel sounds normal; no masses,  no organomegaly, wound c/d/i  Lab Results:   Recent Labs  05/18/13 0720 05/19/13 0520  WBC 4.8 4.9  HGB 10.9* 11.7*  HCT 33.2* 34.7*  PLT 404* 399   BMET  Recent Labs  05/18/13 0720 05/19/13 0520  NA 138 135  K 4.7 4.0  CL 101 100  CO2 27 23  GLUCOSE 98 104*  BUN 27* 25*  CREATININE 1.80* 1.27  CALCIUM 8.9 8.9   PT/INR  Recent Labs  05/18/13 0720 05/19/13 0520  LABPROT 14.7 15.2  INR 1.17 1.23   ABG  Recent Labs  05/17/13 0430  PHART 7.506*  HCO3 29.0*    Studies/Results: No results found.  Anti-infectives: Anti-infectives   None      Assessment/Plan: S/P ileostomy takedown - cont PO AKI - improving  Mobilizing with therapies Pt has f/u appt with Dr. Richardean Sale 1-2 weeks that he can keep Con't with BID dressing changes at home.   LOS: 3 days    Marigene Ehlers., [facility] 05/19/2013

## 2013-05-19 NOTE — Care Management Note (Signed)
   CARE MANAGEMENT NOTE 05/19/2013  Patient:  Rick Edwards, Rick Edwards   Account Number:  [id number]  Date Initiated:  05/19/2013  Documentation initiated by:  Lindie Roberson  Subjective/Objective Assessment:   HH needs.     Action/Plan:   Pt resides with his parents and plans to return to their home. Active with [facility] for [facility] prior to admission and wishes to continue with [facility]. [facility] notified of d/c needs.   Anticipated DC Date:  05/20/2013   Anticipated DC Plan:  HOME W HOME HEALTH SERVICES         Choice offered to / List presented to:          [facility] arranged  HH-1 RN  HH-2 PT      [facility] agency  [facility]   Status of service:  In process, will continue to follow Medicare Important Message given?   (If response is "NO", the following Medicare IM given date fields will be blank) Date Medicare IM given:   Date Additional Medicare IM given:    Discharge Disposition:    Per UR Regulation:    If discussed at Long Length of Stay Meetings, dates discussed:    Comments:

## 2013-05-19 NOTE — Progress Notes (Addendum)
[facility] PROGRESS NOTE  Rick Edwards ZOX:[id number] DOB: Jun 30, 1956 DOA: 05/16/2013 PCP: Sanda Linger, MD  HPI/Subjective: 57 yo AAM with PMH of Crohn's disease and antithrombin III deficiency presented to the [facility] with weakness, nausea, dehydration, and diarrhea.  He is s/p hemicolectomy in 2001 due to a retroperitoneal abscess, which was stable for many years.  He underwent total colectomy and ileostomy due to a partial obstruction in August 2013.  In September 2014, he had ileostomy takedown and developed an intra-abdominal abscess.  This was treated with ABx and wound vac with partial resolution on follow-up CT, and he was D/C'd home on 05/09/13.  While at home he reported decreased urination, increased diarrhea, weakness, thus his parents brought him back for further evaluation.    In the ER, initial CMP is hemolyzed, but concerning for potassium > 9, BUN 67, creatinine 6.7, bicarb 15. CBC notable only for plt of 685. Lactic acid 1.3. ECG demonstrates NSR, peaked T-waves, QRS . In the ER, he was given calcium gluconate, dextrose with insulin, sodium bicarb once, albuterol neb, and a 1L NS bolus. Repeat CMP is pending at the time of admission call. General surgery has been consulted and nephrology was notified by the ER physician.   Today (05/19/13) he reports feeling very well and that he is urinating well and he feels that the diarrhea is less frequent and more formed than yesterday (05/18/13)  Assessment/Plan:  Acute Renal Failure: - Most likely secondary to IV contrast (exposure 05/08/13) and dehydration - Cr improving (baseline 0.70); 1.27 (05/19/13) - Had one HD Tx 05/16/13 - Continue IVF; add renal diet - Will continue to monitor  Acute Hyperkalemia: - Secondary to KCl intake, dehydration, renal failure - On telemetry - Continue IVF - Stable - Will continue to monitor  Diarrhea: - Secondary to ileostomy reversal; ? Possible long-term diarrhea (short gut  syndrome) - Less frequent and better formed stools today - Added Immodium  - Tolerating oral intake - Appreciate surgery consult; stable from surgery stand point. - Will continue to monitor  Dehydration: - Secondary to decreased intake and increased diarrhea - Continue IVF - Stable - Will continue to monitor  Crohn's Disease (s/p total colectomy): - Stable - Abdominal dressing changed 05/19/13; dry and intact - Appreciate surgery consult; stable from surgery stand point. - Will continue Azathioprine once stable at home  Antithrombin III Deficiency: - Long-term Tx with Coumadin at home, but has not been taking it since Surgery in September 2014 - Lovenox >> Coumadin bridge - INR 1.23 (05/19/13) - D/C Lovenox once INR >2 - Appreciate pharmacy input - Will continue to monitor  Hypertension: - Stable - Labetalol ordered prn for high blood pressure - May continue tribenzor once D/C home - Will continue to monitor  Nausea and Vomiting: - No recent episodes - Tolerating renal diet - Stable  Weakness: - Much stronger today - Has been ambulating to restroom  - PT recommended home health. - Ambulate in halls as tolerated  DVT Prophylaxis: - Lovenox SQ daily + Coumadin (D/C Lovenox once INR >2)   Code Status: Full Family Communication: None at bedside  Disposition Plan: Remain inpatient.  Likely D/C tomorrow with home health services.  Lives with his parents.   Consultants:  Nephrology  Pulmonology  Surgery  Procedures:  Central Line placement (right IJ)  Antibiotics:  None     Objective: Filed Vitals:   05/19/13 0552  BP: 129/81  Pulse: 87  Temp: 98.7 F (37.1 C)  Resp: 17    Intake/Output Summary (Last 24 hours) at 05/19/13 1029 Last data filed at 05/19/13 0909  Gross per 24 hour  Intake 1847.5 ml  Output    950 ml  Net  897.5 ml   Filed Weights   05/18/13 0500 05/18/13 1323 05/19/13 0552  Weight: 83.7 kg (184 lb 8.4 oz) 80.287 kg (177  lb) 80.9 kg (178 lb 5.6 oz)    Exam:   General:  Resting comfortably in chair, seems stronger, in no apparent distress.  Cardiovascular: RRR, no M/G/R.  No peripheral edema.  Distal pulses intact and symmetrical.  Respiratory: Clear to auscultation bilaterally.  No rales, rhonchi, or wheezes.  Abdomen: Soft, non-tender.  Wound dressing intact and dry.  Bowel sounds heard in all 4 quadrants.  Musculoskeletal: Full ROM.  Strength 5/5 all extremities.  Psychiatric:  Good affect, smiling, responds appropriately.   Data Reviewed: Basic Metabolic Panel:  Recent Labs Lab 05/17/13 0500 05/17/13 1113 05/17/13 1359 05/18/13 0720 05/19/13 0520  NA 135 134* 134* 138 135  K 5.7* 5.7* 5.3* 4.7 4.0  CL 95* 94* 96 101 100  CO2 30 30 27 27 23   GLUCOSE 118* 111* 107* 98 104*  BUN 30* 30* 30* 27* 25*  CREATININE 2.98* 2.70* 2.50* 1.80* 1.27  CALCIUM 9.0 9.1 8.8 8.9 8.9  MG 2.0  --   --  2.0 1.9  PHOS  --   --   --  4.6  --    Liver Function Tests:  Recent Labs Lab 05/16/13 1325 05/16/13 1500 05/17/13 0500 05/18/13 0720  AST HEMOLYZED SPECIMEN, RESULTS MAY BE AFFECTED 22 18 20   ALT HEMOLYZED SPECIMEN, RESULTS MAY BE AFFECTED 56* 49 47  ALKPHOS HEMOLYZED SPECIMEN, RESULTS MAY BE AFFECTED 135* 117 111  BILITOT HEMOLYZED SPECIMEN, RESULTS MAY BE AFFECTED 0.5 1.0 0.7  PROT HEMOLYZED SPECIMEN, RESULTS MAY BE AFFECTED 7.6 6.8 6.6  ALBUMIN HEMOLYZED SPECIMEN, RESULTS MAY BE AFFECTED 3.5 3.2* 3.0*    Recent Labs Lab 05/16/13 1325 05/16/13 1500  LIPASE HEMOLYZED SPECIMEN, RESULTS MAY BE AFFECTED 28   CBC:  Recent Labs Lab 05/16/13 1325 05/16/13 1757 05/16/13 1958 05/17/13 0500 05/18/13 0720 05/19/13 0520  WBC 7.4  --  8.5 6.5 4.8 4.9  NEUTROABS 5.2  --   --  4.3 2.4 2.5  HGB 14.5 14.6 13.6 12.0* 10.9* 11.7*  HCT 42.9 43.0 39.3 34.9* 33.2* 34.7*  MCV 89.4  --  88.5 87.3 89.7 90.4  PLT 685*  --  583* 482* 404* 399   Cardiac Enzymes:  Recent Labs Lab 05/16/13 1750  05/16/13 1958 05/17/13 0147 05/17/13 0947 05/17/13 1200  TROPONINI <0.30 <0.30 0.39* <0.30 <0.30   CBG:  Recent Labs Lab 05/16/13 1704  GLUCAP 102*    Recent Results (from the past 240 hour(s))  URINE CULTURE     Status: None   Collection Time    05/16/13  5:25 PM      Result Value Range Status   Specimen Description URINE, CLEAN CATCH   Final   Special Requests Normal   Final   Culture  Setup Time     Final   Value: 05/16/2013 21:15     Performed at [employer] Count     Final   Value: NO GROWTH     Performed at [facility]   Culture     Final   Value: NO GROWTH     Performed at [facility]  Report Status 05/17/2013 FINAL   Final  MRSA PCR SCREENING     Status: None   Collection Time    05/16/13  7:23 PM      Result Value Range Status   MRSA by PCR NEGATIVE  NEGATIVE Final   Comment:            The GeneXpert MRSA Assay (FDA     approved for NASAL specimens     only), is one component of a     comprehensive MRSA colonization     surveillance program. It is not     intended to diagnose MRSA     infection nor to guide or     monitor treatment for     MRSA infections.  CLOSTRIDIUM DIFFICILE BY PCR     Status: None   Collection Time    05/16/13  7:24 PM      Result Value Range Status   C difficile by pcr NEGATIVE  NEGATIVE Final     Studies: US Renal  05/17/2013 *RADIOLOGY REPORT* Clinical Data: Acute renal failure. RENAL/URINARY TRACT ULTRASOUND COMPLETE Comparison: CT of the abdomen and pelvis performed 05/08/2013 Findings: Right Kidney: The right kidney measures 11.7 cm in length. The kidney demonstrates normal size, echogenicity and configuration. No significant cortical thinning is seen. No hydronephrosis or calcification is identified. No masses are seen. Left Kidney: The left kidney measures 11.9 cm in length. The kidney demonstrates normal size, echogenicity and configuration. No significant cortical thinning is seen. No  hydronephrosis or calcification is identified. A small cyst is noted at the upper pole of the left kidney, measuring 1.5 cm. Bladder: The bladder is decompressed and not well assessed. Diffuse fatty infiltration is noted within the liver. IMPRESSION: 1. No evidence of hydronephrosis; renal echogenicity within normal limits. 2. Small left renal cyst seen. 3. Diffuse fatty infiltration within the liver. Original Report Authenticated By: Tonia Ghent, M.D. ]   Dg Chest Port 1 View  05/16/2013 *RADIOLOGY REPORT* Clinical Data: Placement of right-sided hemodialysis catheter. PORTABLE CHEST - 1 VIEW Comparison: Chest radiograph performed earlier today at 05:26 p.m. Findings: The patient's right-sided dual lumen catheter is noted ending about the distal SVC. The lungs are relatively well expanded. Bibasilar atelectasis or scarring is again noted. No pleural effusion or pneumothorax is seen. The cardiomediastinal silhouette is mildly enlarged. No acute osseous abnormalities are identified. IMPRESSION: 1. Right-sided dual lumen catheter noted ending about the distal SVC. 2. Bibasilar atelectasis or scarring again noted; lungs otherwise clear. 3. Mild cardiomegaly. Original Report Authenticated By: Tonia Ghent, M.D.    Dg Abd Acute W/chest  05/16/2013 CLINICAL DATA: Status post colectomy and ileostomy reversal. Abdominal infection. Dizziness, weakness and nausea. Firm and distended abdomen. EXAM: ACUTE ABDOMEN SERIES (ABDOMEN 2 VIEW & CHEST 1 VIEW) COMPARISON: Multiple priors, most recently CT of the abdomen and pelvis 05/08/2013. FINDINGS: Lung volumes are low and there are linear opacities in lung bases bilaterally, most compatible with subsegmental atelectasis. No definite consolidative airspace disease. No pleural effusions. No evidence of pulmonary edema. Heart size is normal. Mediastinal contours are unremarkable. There are numerous dilated loops of small bowel throughout the abdomen, measuring up to  approximately 5.4 cm in diameter. There appears to be some distal rectal gas. A suture line is noted in the central pelvis, likely a side of ileorectal anastomosis. Surgical clips also project over the right side of the abdomen and skin staples project over the low anatomic pelvis. No frank pneumoperitoneum is identified on the  left lateral decubitus view. However, there are multiple air-fluid levels within the dilated loops of small bowel. IMPRESSION: *The appearance of the abdomen is suggestive of a potential small bowel obstruction. However, given the appearance on the prior CT scan and the patient's postoperative state, the possibility of an ileus warrants consider consideration. Clinical correlation is recommended. *No pneumoperitoneum. *Low lung volumes with bibasilar subsegmental atelectasis. Electronically Signed By: Trudie Reed M.D. On: 05/16/2013 17:49  Scheduled Meds: . enoxaparin (LOVENOX) injection  85 mg Subcutaneous Q12H  . famotidine  20 mg Oral QHS  . heparin lock flush  500 Units Intravenous Once  . sodium bicarbonate  50 mEq Intravenous Once  . sodium chloride  2,000 mL Intravenous Once  . sodium chloride  3 mL Intravenous Q12H  . warfarin  7.5 mg Oral ONCE-1800  . Warfarin - Pharmacist Dosing Inpatient   Does not apply q1800   Continuous Infusions: . sodium chloride 75 mL/hr at 05/19/13 0457    Principal Problem:   Acute renal failure Active Problems:   HYPERTENSION   CROHN'S DISEASE-LARGE INTESTINE   Long term (current) use of anticoagulants   Antithrombin 3 deficiency   HOH (hard of hearing)   Antithrombin III deficiency   Acute hyperkalemia   Generalized weakness   Nausea and vomiting   Hiccoughs   Joycelyn Man, PA-S [place], [place]  [facility] Pager [phone]. If 7PM-7AM, please contact night-coverage at [email or web address], password [facility] 05/19/2013, 10:29 AM  LOS: 3 days      I have directly reviewed the clinical findings, lab, imaging  studies and management of this patient in detail. I have interviewed and examined the patient and agree with the documentation,  as recorded by the Physician extender.  [name] M.D on 05/19/2013 at 10:30 AM  [facility] Office  [phone]

## 2013-05-19 NOTE — Progress Notes (Signed)
ANTICOAGULATION CONSULT NOTE - Follow Up Consult  Pharmacy Consult for Warfarin + Lovenox Indication: ATIII Deficiency, Hx arterial thrombi  Allergies  Allergen Reactions  . Mesalamine     REACTION: Rash   Patient Measurements: Height: 5\' 9"  (175.3 cm) Weight: 178 lb 5.6 oz (80.9 kg) IBW/kg (Calculated) : 70.7 Vital Signs: Temp: 98.7 F (37.1 C) (10/13 0552) Temp src: Oral (10/13 0552) BP: 129/81 mmHg (10/13 0552) Pulse Rate: 87 (10/13 0552) Labs:  Recent Labs  05/16/13 1325  05/17/13 0147 05/17/13 0500 05/17/13 0930 05/17/13 0947  05/17/13 1200 05/17/13 1359 05/18/13 0720 05/19/13 0520  HGB 14.5  < >  --  12.0*  --   --   --   --   --  10.9* 11.7*  HCT 42.9  < >  --  34.9*  --   --   --   --   --  33.2* 34.7*  PLT 685*  < >  --  482*  --   --   --   --   --  404* 399  APTT 37  --   --   --   --   --   --   --   --   --   --   LABPROT 14.3  --   --   --  13.7  --   --   --   --  14.7 15.2  INR 1.13  --   --   --  1.07  --   --   --   --  1.17 1.23  CREATININE HEMOLYZED SPECIMEN, RESULTS MAY BE AFFECTED  < >  --  2.98*  --   --   < >  --  2.50* 1.80* 1.27  TROPONINI  --   < > 0.39*  --   --  <0.30  --  <0.30  --   --   --   < > = values in this interval not displayed.  Estimated Creatinine Clearance: 66.5 ml/min (by C-G formula based on Cr of 1.27).  Assessment: 55 YOM with antithrombin III deficiency and history of arterial thrombi on warfarin and Lovenox overlap day #3. INR today is 1.23. SCr improving at 1.27, estimated CrCl ~55 ml/min. UOP ~0.4 cc/kg/hr last 24 hours. Patient no on a renal diet today.   Goal of Therapy:  INR 2-3 Anti-Xa level 0.6-1.2 units/ml 4hrs after LMWH dose given if needed Monitor platelets by anticoagulation protocol: Yes   Plan:  1. Warfarin 7.5mg  po x1 again tonight.  2. Continue Lovenox 85mg  SQ q12 hours. Discontinue when INR >2. 3. Continue to monitor CBC q72h and PT/INR daily.   Harland German, Pharm D 05/19/2013 7:57  AM

## 2013-05-19 NOTE — Telephone Encounter (Signed)
FYI-  Patient has been admitted since Friday

## 2013-05-19 NOTE — Progress Notes (Signed)
Physical Therapy Treatment Patient Details Name: WEYLYN RICCIUTI MRN: [id number] DOB: 10-30-1955 Today's Date: 05/19/2013 Time: 4132-4401 PT Time Calculation (min): 10 min  PT Assessment / Plan / Recommendation  History of Present Illness 57 yo w/ hx of Crohn's readmitted 10/10 with acute renal failure and hyperkalemia requiring HD . Recent discharge 10/3 after intraabdominal abscess post op from ileostomy take down , discharged home on wound vac. Pt w/ diarrhea and poor intake prior to admission.   PT Comments   Pt is progressing well with mobility.  He is ambulating in his room independently and is close to his normal baseline level of functioning.    Follow Up Recommendations  No PT follow up;Supervision - Intermittent     Does the patient have the potential to tolerate intense rehabilitation    NA  Barriers to Discharge   None      Equipment Recommendations  None recommended by PT    Recommendations for Other Services   None  Frequency Min 3X/week   Progress towards PT Goals Progress towards PT goals: Progressing toward goals  Plan Discharge plan needs to be updated    Precautions / Restrictions Precautions Precautions: Fall Precaution Comments: due to h/o R forefoot amputation.     Pertinent Vitals/Pain See vitals flow sheet.     Mobility  Bed Mobility Supine to Sit: 6: Modified independent (Device/Increase time);With rails;HOB elevated Sitting - Scoot to Edge of Bed: 6: Modified independent (Device/Increase time);With rail Sit to Supine: 6: Modified independent (Device/Increase time);With rail;HOB elevated Details for Bed Mobility Assistance: pt got into and out of bed easily with the assist of the railing.   Transfers Sit to Stand: 5: Supervision;With upper extremity assist;From bed Stand to Sit: 5: Supervision;With upper extremity assist;To bed Details for Transfer Assistance: supervision for safety Ambulation/Gait Ambulation/Gait Assistance: 5:  Supervision Ambulation Distance (Feet): 350 Feet Assistive device: Other (Comment) (IV pole) Ambulation/Gait Assistance Details: Pt did not feel like using the RW today Gait Pattern: Step-through pattern;Antalgic Gait velocity: abnormal gait pattern is likely baseline due to h/o R forefoot amputation last year.  Gait speed WNL today        PT Goals (current goals can now be found in the care plan section) Acute Rehab PT Goals Patient Stated Goal: to get stronger and go home tomorrow.    Visit Information  Last PT Received On: 05/19/13 Assistance Needed: +1 Reason Eval/Treat Not Completed: Other (comment) (per IV team RN pt on bedrest x 1 hour after she removed) History of Present Illness: 57 yo w/ hx of Crohn's readmitted 10/10 with acute renal failure and hyperkalemia requiring HD . Recent discharge 10/3 after intraabdominal abscess post op from ileostomy take down , discharged home on wound vac. Pt w/ diarrhea and poor intake prior to admission.    Subjective Data  Subjective: Pt reports he is feeling much better.  He reports at his normal baseline he walks with a cane.  He reports just PTA his brother got a walker out of storage for him to use.   Patient Stated Goal: to get stronger and go home tomorrow.     Cognition  Cognition Arousal/Alertness: Awake/alert Behavior During Therapy: WFL for tasks assessed/performed Overall Cognitive Status: Within Functional Limits for tasks assessed    Balance  Static Sitting Balance Static Sitting - Balance Support: No upper extremity supported;Feet supported Static Sitting - Level of Assistance: 7: Independent Static Standing Balance Static Standing - Balance Support: Right upper extremity supported Static Standing -  Level of Assistance: 5: Stand by assistance  End of Session PT - End of Session Activity Tolerance: Patient tolerated treatment well Patient left: in bed;with call bell/phone within reach     [place] B. Chanel Mcadams, PT, DPT  [phone]   05/19/2013, 5:14 PM

## 2013-05-19 NOTE — Progress Notes (Signed)
Utilization review completed.  

## 2013-05-19 NOTE — Telephone Encounter (Signed)
As of 05/19/13 patient is currently admitted

## 2013-05-20 ENCOUNTER — Other Ambulatory Visit (INDEPENDENT_AMBULATORY_CARE_PROVIDER_SITE_OTHER): Payer: Self-pay

## 2013-05-20 ENCOUNTER — Telehealth (INDEPENDENT_AMBULATORY_CARE_PROVIDER_SITE_OTHER): Payer: Self-pay

## 2013-05-20 DIAGNOSIS — E875 Hyperkalemia: Secondary | ICD-10-CM

## 2013-05-20 DIAGNOSIS — R140 Abdominal distension (gaseous): Secondary | ICD-10-CM

## 2013-05-20 DIAGNOSIS — D126 Benign neoplasm of colon, unspecified: Secondary | ICD-10-CM

## 2013-05-20 LAB — PROTIME-INR
INR: 1.29 (ref 0.00–1.49)
Prothrombin Time: 15.8 seconds — ABNORMAL HIGH (ref 11.6–15.2)

## 2013-05-20 LAB — RENAL FUNCTION PANEL
Albumin: 3.1 g/dL — ABNORMAL LOW (ref 3.5–5.2)
BUN: 18 mg/dL (ref 6–23)
Calcium: 8.6 mg/dL (ref 8.4–10.5)
Chloride: 103 mEq/L (ref 96–112)
Creatinine, Ser: 1.03 mg/dL (ref 0.50–1.35)
GFR calc Af Amer: >90 mL/min (ref >90)
Glucose, Bld: 102 mg/dL — ABNORMAL HIGH (ref 70–99)
Potassium: 4 mEq/L (ref 3.5–5.1)

## 2013-05-20 MED ORDER — ENOXAPARIN SODIUM 100 MG/ML ~~LOC~~ SOLN: 85.0000 mg | Freq: Two times a day (BID) | SUBCUTANEOUS | Status: DC

## 2013-05-20 MED ORDER — ENOXAPARIN SODIUM 80 MG/0.8ML ~~LOC~~ SOLN
80.0000 mg | Freq: Two times a day (BID) | SUBCUTANEOUS | Status: DC
  Filled 2013-05-20 (×2): qty 0.8

## 2013-05-20 MED ORDER — ENOXAPARIN SODIUM 80 MG/0.8ML ~~LOC~~ SOLN: 80.0000 mg | Freq: Two times a day (BID) | SUBCUTANEOUS | Status: DC

## 2013-05-20 MED ORDER — LOPERAMIDE HCL 2 MG PO CAPS: 2.0000 mg | ORAL_CAPSULE | ORAL | Status: DC | PRN

## 2013-05-20 NOTE — Telephone Encounter (Signed)
Called and spoke to Harland Dingwall (father) regarding patient appointment on 06/04/13.  Made him aware that Dr. Johna Sheriff does not have any time in the office until 06/04/13.  Father ask if patient can eat chicken without skin.  Father states patient wants so bad to eat chicken but, is currently on a liquid diet.  Made father aware that I will check with Dr. Johna Sheriff about appointment and food's Champion can eat.

## 2013-05-20 NOTE — Discharge Summary (Signed)
Physician Discharge Summary  Rick Edwards:[id number] DOB: [birth year] DOA: 05/16/2013  PCP: Sanda Linger, MD  Admit date: 05/16/2013 Discharge date: 05/20/2013  Time spent: 45 minutes  Recommendations for Outpatient Follow-up:  1. Please recheck CBC, BMP, PT/INR in 1-2 days. 2. Please change wound dressings twice daily- wound care home health 3. PT home health  Discharge Diagnoses:  Principal Problem:   Acute renal failure Active Problems:   HYPERTENSION   CROHN'S DISEASE-LARGE INTESTINE   Long term (current) use of anticoagulants   Antithrombin 3 deficiency   HOH (hard of hearing)   Antithrombin III deficiency   Acute hyperkalemia   Generalized weakness   Nausea and vomiting   Hiccoughs   Discharge Condition: Stable  Diet recommendation: Low sodium, heart healthy diet  Filed Weights   05/18/13 1323 05/19/13 0552 05/20/13 0526  Weight: 80.287 kg (177 lb) 80.9 kg (178 lb 5.6 oz) 79.9 kg (176 lb 2.4 oz)    History of present illness:  57 yo AAM with PMH of Crohn's disease and antithrombin III deficiency presented to the [facility] with weakness, nausea, dehydration, and diarrhea. He is s/p hemicolectomy in 2001 due to a retroperitoneal abscess, which was stable for many years. He underwent total colectomy and ileostomy due to a partial obstruction in August 2013. In September 2014, he had ileostomy takedown and developed an intra-abdominal abscess. This was treated with ABx and wound vac with partial resolution on follow-up CT, and he was D/C'd home on 05/09/13. While at home he reported decreased urination, increased diarrhea, weakness, thus his parents brought him back for further evaluation.   In the ER, initial CMP is hemolyzed, but concerning for potassium > 9, BUN 67, creatinine 6.7, bicarb 15. CBC notable only for plt of 685. Lactic acid 1.3. ECG demonstrates NSR, peaked T-waves, QRS .  Hospital Course:   Acute Renal Failure:  - Most likely secondary to IV  contrast (exposure 05/08/13) and dehydration  - Cr improving (baseline 0.70); 1.03 (05/20/13)  - Had one HD Tx 05/16/13  - IVF given - Appreciate Nephrology consult; stable from a nephrology stand point  Acute Hyperkalemia:  - Secondary to KCl intake, dehydration, renal failure  - On telemetry  - IVF given - Stable (4.0 05/20/13)    Diarrhea:  - Secondary to ileostomy reversal; ? Possible long-term diarrhea (short gut syndrome)  - Less frequent and better formed stools today on imodium - C. Diff negative - Tolerating oral intake  - Appreciate surgery consult; stable from surgery stand point.   Crohn's Disease (s/p total colectomy):  - Stable  - Had ileostomy reversal with complication of intra-abdominal abscess requiring ABx treatment and wound vac  - Abdominal dressing changed 05/20/13; dry and intact  - Appreciate surgery consult; stable from surgery stand point.  - Change dressing twice daily  Antithrombin III Deficiency:  - Long-term Tx with Coumadin at home, but has not been taking it since Surgery in September 2014  - Lovenox >> Coumadin bridge (5 days) - INR 1.29 (05/20/13)  - D/C Lovenox once INR >2  - INR check in 1-2 days with PCP  Hypertension:  - Stable  - Labetalol ordered prn for high blood pressure  - Consider no HTN Tx at home due to stability.  Tribenzor discontinued until PCP follow up.  Weakness:  - Much stronger today  - Has been ambulating to restroom  - PT recommended home health.   DVT Prophylaxis:  - Lovenox SQ daily + Coumadin (D/C Lovenox  once INR >2)   Procedures:  Central line placement (right IJ) 05/16/13>>05/19/13  Consultations:  Nephrology  Pulmonology  Surgery  Discharge Exam: Filed Vitals:   05/20/13 0526  BP: 146/85  Pulse: 85  Temp: 99.2 F (37.3 C)  Resp: 18    General: Resting comfortably in bed, seems stronger, in no apparent distress. Cardiovascular: RRR, no M/G/R. No peripheral edema. Distal pulses intact  and symmetrical. Respiratory: Clear to auscultation bilaterally. No rales, rhonchi, or wheezes.  Abdomen: Soft, non-tender. Wound dressing intact and dry. Bowel sounds heard in all 4 quadrants. Musculoskeletal: Full ROM. Strength 5/5 all extremities.  Neurologic: CN II-XII intact.  No focal deficits. Psychiatric: Good affect, smiling, responds appropriately.   Discharge Instructions        Discharge Orders   Future Appointments Provider Department Dept Phone   06/04/2013 2:45 PM Mariella Saa, MD [facility] Surgery, [place] [phone]   07/23/2013 9:00 AM [facility] MEDICAL ONCOLOGY [phone]   07/24/2013 9:00 AM Si Gaul, MD [facility] MEDICAL ONCOLOGY [phone]   09/24/2013 9:00 AM Chuck Hint, MD [facility] [phone]   Future Orders Complete By Expires   Diet - low sodium heart healthy  As directed    Discharge instructions  As directed    Comments:     Follow with Primary MD Sanda Linger, MD in 2 days, but Lovenox and Coumadin dose adjusted   Get CBC, CMP, INR  checked 2 days by Primary MD and again as instructed by your Primary MD.   Get Medicines reviewed and adjusted.  Please request your Prim.MD to go over all Hospital Tests and Procedure/Radiological results at the follow up, please get all Hospital records sent to your Prim MD by signing hospital release before you go home.  Activity: As tolerated with Full fall precautions use walker/cane & assistance as needed   Diet: Full Liquid  For Heart failure patients - Check your Weight same time everyday, if you gain over 2 pounds, or you develop in leg swelling, experience more shortness of breath or chest pain, call your Primary MD immediately. Follow Cardiac Low Salt Diet and 1.8 lit/day fluid restriction.  Disposition Home  If you experience worsening of your admission symptoms, develop shortness of breath,  life threatening emergency, suicidal or homicidal thoughts you must seek medical attention immediately by calling 911 or calling your MD immediately  if symptoms less severe.  You Must read complete instructions/literature along with all the possible adverse reactions/side effects for all the Medicines you take and that have been prescribed to you. Take any new Medicines after you have completely understood and accpet all the possible adverse reactions/side effects.   Do not drive and provide baby sitting services if your were admitted for syncope or siezures until you have seen by Primary MD or a Neurologist and advised to do so again.  Do not drive when taking Pain medications.    Do not take more than prescribed Pain, Sleep and Anxiety Medications  Special Instructions: If you have smoked or chewed Tobacco  in the last 2 yrs please stop smoking, stop any regular Alcohol  and or any Recreational drug use.  Wear Seat belts while driving.   Please note  You were cared for by a hospitalist during your hospital stay. If you have any questions about your discharge medications or the care you received while you were in the hospital after you are discharged, you  can call the unit and asked to speak with the hospitalist on call if the hospitalist that took care of you is not available. Once you are discharged, your primary care physician will handle any further medical issues. Please note that NO REFILLS for any discharge medications will be authorized once you are discharged, as it is imperative that you return to your primary care physician (or establish a relationship with a primary care physician if you do not have one) for your aftercare needs so that they can reassess your need for medications and monitor your lab values.   Increase activity slowly  As directed        Medication List    STOP taking these medications       amoxicillin-clavulanate 875-125 MG per tablet  Commonly known as:   AUGMENTIN     Olmesartan-Amlodipine-HCTZ 20-5-12.5 MG Tabs  Commonly known as:  TRIBENZOR      TAKE these medications       azaTHIOprine 50 MG tablet  Commonly known as:  IMURAN  Take 75 mg by mouth daily.     enoxaparin 80 MG/0.8ML injection  Commonly known as:  LOVENOX  Inject 0.8 mLs (80 mg total) into the skin every 12 (twelve) hours.     loperamide 2 MG capsule  Commonly known as:  IMODIUM  Take 1 capsule (2 mg total) by mouth as needed for diarrhea or loose stools.     oxyCODONE-acetaminophen 5-325 MG per tablet  Commonly known as:  PERCOCET/ROXICET  Take 1-2 tablets by mouth every 6 (six) hours as needed.     potassium chloride SA 20 MEQ tablet  Commonly known as:  K-DUR,KLOR-CON  Take 1 tablet (20 mEq total) by mouth daily.     warfarin 5 MG tablet  Commonly known as:  COUMADIN  Take 5 mg by mouth daily. Take 1 tablet (5mg ) every Wed and Saturday and 1.5 tablets (7.5mg ) all other days of the week.       Allergies  Allergen Reactions  . Mesalamine     REACTION: Rash   Follow-up Information   Follow up with Sanda Linger, MD. Schedule an appointment as soon as possible for a visit in 2 days. (INR monitor)    Specialty:  Internal Medicine   Contact information:   [address] [place] [address] [phone]       Follow up with Mariella Saa, MD. Schedule an appointment as soon as possible for a visit in 1 week.   Specialty:  General Surgery   Contact information:   [address] [place] [address] [phone]       Follow up with HAYES,JOHN C, MD. Schedule an appointment as soon as possible for a visit in 1 week.   Specialty:  Gastroenterology   Contact information:   [address], [address] [place] [address] [phone]        The results of significant diagnostics from this hospitalization (including imaging, microbiology, ancillary and laboratory) are listed below for reference.     Significant Diagnostic Studies: Dg Chest 2 View  04/30/2013   CLINICAL DATA:  57 year old male with postoperative leukocytosis. Status post ileostomy takedown.  EXAM: CHEST  2 VIEW  COMPARISON:  12/29/2012 and earlier.  FINDINGS: Semi upright AP and lateral views. Lower lung volumes. Stable cardiac size and mediastinal contours. Visualized tracheal air column is within normal limits. No pneumothorax or pneumoperitoneum identified. Patchy bibasilar opacity  most resembles atelectasis. No pleural effusion or pulmonary edema. No other confluent opacity. Air-fluid level in the stomach with mildly prominent gas-filled bowel loops in the visible upper abdomen.  IMPRESSION: Lower lung volumes with bibasilar atelectasis.   Electronically Signed   By: Augusto Gamble M.D.   On: 04/30/2013 12:55   Dg Abd 1 View  04/24/2013   CLINICAL DATA:  Reversed colostomy. Abdominal pain. Postop abdominal distention.  EXAM: ABDOMEN - 1 VIEW  COMPARISON:  Abdomen radiographs, 03/07/2012 and CT, 11/17/2011.  FINDINGS: There are dilated loops of small bowel within the central abdomen and right mid abdomen. The stomach is mildly distended with air.  There are surgical vascular clips in the right mid abdomen, stable from the prior study. There are new overlying skin staples.  Two skin staples are also noted superimposed over the right pelvis.  IMPRESSION: Dilated loops of small bowel. This may reflect a diffuse postoperative adynamic ileus, favored, versus a partial obstruction.   Electronically Signed   By: Amie Portland   On: 04/24/2013 15:42   Ct Pelvis Wo Contrast  05/02/2013   *RADIOLOGY REPORT*  Clinical Data: History of Crohn disease, post colectomy with ileorectal anastomosis and recent ileostomy takedown.  Prolonged recovery with subsequent abdominal CT demonstrating an indeterminate pelvic fluid collection.  Evaluate pelvic fluid collection for potential percutaneous aspiration/drainage.  CT PELVIS WITHOUT CONTRAST  Technique:   Multidetector CT imaging of the pelvis was performed following the standard protocol without intravenous contrast.  Comparison: CT abdomen pelvis - 04/30/2013; pelvic CT - 05/01/2013  Findings:  Noncontrast abdominal CT was performed with the patient positioned left lateral decubitus on the CT gantry.  Noncontrast imaging demonstrates grossly unchanged approximately 3.6 x 6.0 cm fluid collection within the pelvis anterior to the rectum and posterior to the bladder.  Again, there is no definitive extravasation of ingested enteric contrast into this pelvic fluid collection.  As no adequate percutaneous window was seen to this small pelvic fluid collection, no procedure was performed.  Grossly unchanged appearance of partially loculated fluid collection within the left mid hemiabdomen (image 5, series 2), incompletely imaged.  Midline wound vac.  Skin staples within the right lower abdominal quadrant at location of ileostomy takedown.  No acute or aggressive osseous abnormalities.  IMPRESSION: 1.  Unchanged size and appearance of approximately 6.0 x 3.6 cm pelvic fluid collection.  Again, there is no extravasation of ingested enteric contrast into this pelvic fluid collection.  Given lack of adequate percutaneous window, attempted aspiration/drainage of the fluid collection was not performed. 2.  Grossly unchanged loculated fluid within the left mid hemiabdomen, incompletely imaged.   Original Report Authenticated By: Tacey Ruiz, MD   Ct Pelvis Wo Contrast  05/01/2013   CLINICAL DATA:  Pelvic abscess. FOLLOW UP pelvic fluid collection.  EXAM: CT PELVIS WITHOUT CONTRAST  TECHNIQUE: Multidetector CT imaging of the pelvis was performed following the standard protocol without intravenous contrast.  COMPARISON:  04/30/2013.  FINDINGS: Small bowel remains dilated. Contrast has progressed distally into the rectum. Anastomotic staple line is present in the anatomic pelvis. Immediately inferior to this, there is a oblong  fluid collection that today measures 64 mm x 33 mm on axial imaging in the rectovesical pouch. No interval change compared to yesterday's examination. Urinary bladder and prostate gland appear within normal limits with excreted contrast opacifying the bladder. Bony pelvis appears within normal limits. No free air is identified. Midline abdominal wound is present, likely healing by secondary intention. Right lower  quadrant staples are present compatible with ileostomy air colostomy takedown. The left mesenteric fluid collection remains present, without interval change compared to yesterday.  IMPRESSION: Unchanged pelvic fluid collection which may represent hematoma, seroma or abscess. Contrast is present within the rectum and there is no contrast within the fluid collection to suggest leak.   Electronically Signed   By: Andreas Newport M.D.   On: 05/01/2013 13:55   Ct Abdomen Pelvis W Contrast  05/08/2013   CLINICAL DATA:  Follow-up fluid collection, status post ileostomy take-down, history of Crohn's disease  EXAM: CT ABDOMEN AND PELVIS WITH CONTRAST  TECHNIQUE: Multidetector CT imaging of the abdomen and pelvis was performed using the standard protocol following bolus administration of intravenous contrast.  CONTRAST:  OMNIPAQUE IOHEXOL 300 MG/ML  SOLN  COMPARISON:  CT pelvis dated 05/02/2013. CT abdomen pelvis dated 04/30/2013.  FINDINGS: Mild patchy bilateral lower lobe opacities, likely atelectasis.  Liver, spleen, pancreas, and adrenal glands within normal limits.  Gallbladder is unremarkable. No intrahepatic or extrahepatic ductal dilatation.  Bilateral renal cysts and too small to characterize lesions measuring up to 1.4 cm in the left upper pole (series 2/ image 38). No hydronephrosis.  Status post subtotal colectomy with ileorectal anastomosis. Recent prior ileostomy take down. Mildly prominent/dilated loops of small bowel in the right upper abdomen, likely reflecting adynamic ileus. Contrast  opacifies distal small bowel proximal to the anastomosis in the right pelvis (series 2/image 74).  Residual 2.1 x 3.7 cm pelvic fluid collection (series 2/image 29), decreased. Tiny foci of extraluminal gas in the pelvis (series 2/image 76), new.  Atherosclerotic calcifications of the abdominal aorta and branch vessels.  No abdominopelvic ascites. Mild mesenteric stranding in the right lower quadrant (series 2/image 66).  No suspicious abdominopelvic lymphadenopathy.  Prostate is unremarkable.  Bladder is mildly thick-walled although underdistended.  Postsurgical changes in the midline anterior abdominal wall. Mild stranding with minimal ill-defined fluid (series 2/ image 71).  Mild degenerative changes at L5-S1.  IMPRESSION: Status post subtotal colectomy with ileostomy takedown and ileorectal anastomosis.  2.1 x 3.7 cm pelvic fluid collection with, decreased. Associated small foci of extraluminal gas near the anastomosis.   Electronically Signed   By: Charline Bills M.D.   On: 05/08/2013 11:34   Ct Abdomen Pelvis W Contrast  04/30/2013   CLINICAL DATA:  Status post ileostomy take-down. Leukocytosis. Postoperative day 11  EXAM: CT ABDOMEN AND PELVIS WITH CONTRAST  TECHNIQUE: Multidetector CT imaging of the abdomen and pelvis was performed using the standard protocol following bolus administration of intravenous contrast.  CONTRAST:  OMNIPAQUE IOHEXOL 300 MG/ML  SOLN  COMPARISON:  For 12th 13  FINDINGS: Compressive atelectasis is seen in the posterior lung bases bilaterally.  The liver is enlarged, measuring 20.4 cm in craniocaudal length. Diffuse fatty infiltration of the liver parenchyma is evident. Spleen is normal in appearance. The stomach, duodenum, pancreas, gallbladder, and adrenal glands are unremarkable.  Multiple well-defined low-density lesions of varying sizes are seen in both kidneys measuring from several mm up to a maximum size of 19 mm in the upper pole of the left kidney.  There is no  abdominal aortic aneurysm. No free fluid or lymphadenopathy in the abdomen.  Small bowel loops in the abdomen are dilated and fluid-filled, measuring up to 5.3 cm in diameter. There is an area of loculated fluid in the left mesentery measuring 3.6 x 8.5 cm. This does not have a well organized or enhancing rim to suggest an overt abscess at  this time.  The oral contrast material opacifies nondilated duodenum and proximal jejunum. There is a segment of more distal jejunum which tracks down into the a central pelvis, just cranial to the anastomotic suture line. This jejunum shows mild wall thickening and is not dilated. However oral contrast material migrates distal to this area into small bowel loops that become distended, but the contrast material has not yet reached the most distended small bowel loops in the abdomen and pelvis.  There is a tiny amount of gas in the midline incision which is not completely unexpected 11 days after surgery and the patient appears to have a wound manager in place.  A 1.8 x 5.6 cm fluid collection is seen in the right anterolateral abdominal wall, just deep to the staple line, likely represents the site of stoma takedown. This appearance is not unexpected only 11 days from surgery.  Imaging through the pelvis shows no substantial intraperitoneal free fluid. The rectum is fluid-filled. The ileocolic anastomosis is identified and there is a crescent shaped fluid collection tracking from the anastomotic suture line down to the right under the rectum. Since the blind end of the ileum is evident, I do not think that this crescent-shaped, somewhat tubular fluid collection represents bowel. The largest portion of this fluid collection measures approximately 6 x 3.8 cm and is located in the rectovesical pouch. This collection demonstrates an organized rim which appears to show some enhancement. The anastomosis appears to be patent although the rectum just distal to the anastomotic suture line  appears slightly narrowed with mild circumferential wall thickening.  Bone windows reveal no worrisome lytic or sclerotic osseous lesions.  IMPRESSION: Dilated central small bowel (likely representing distal jejunum and proximal ileum) measures up to 5 cm in diameter and contains layering fluid with associated air-fluid levels. No associated small bowel wall thickening is evident. The small bowel proximal and distal to the most dilated segments are nondilated. These imaging features could be related to an inflammatory ileus. Mechanical obstruction cannot be completely excluded although no overt transition zone is identified.  There is a loculated left mesenteric fluid collection which tracks from the region of the anastomosis up in the left mesentery along the peritoneal sidewall.  There is a loculated crescent shaped, tubular fluid collection with an organized rim which tracks from the anastomosis down the right side of the pelvic sidewall and then under the rectum into the region of the rectovesical pouch.  The fluid collection in the rectovesical pouch appears to be the more organized of the 2 dominant fluid collections (the other one being the left mesenteric/peritoneal cavity collection). Given the apparent organized rim of the pelvic collection, evolving abscess would be a consideration in light of the patient's history of leukocytosis and bowel distention.  I personally discussed these findings by telephone with Dr. Johna Sheriff at approximately 1430 hours on 04/30/2013 .   Electronically Signed   By: Kennith Center M.D.   On: 04/30/2013 14:38   US Renal  05/17/2013   *RADIOLOGY REPORT*  Clinical Data:  Acute renal failure.  RENAL/URINARY TRACT ULTRASOUND COMPLETE  Comparison:  CT of the abdomen and pelvis performed 05/08/2013  Findings:  Right Kidney:  The right kidney measures 11.7 cm in length.  The kidney demonstrates normal size, echogenicity and configuration. No significant cortical thinning is seen.   No hydronephrosis or calcification is identified.  No masses are seen.  Left Kidney:  The left kidney measures 11.9 cm in length.  The kidney demonstrates  normal size, echogenicity and configuration. No significant cortical thinning is seen.  No hydronephrosis or calcification is identified.  A small cyst is noted at the upper pole of the left kidney, measuring 1.5 cm.  Bladder:  The bladder is decompressed and not well assessed.  Diffuse fatty infiltration is noted within the liver.  IMPRESSION:  1.  No evidence of hydronephrosis; renal echogenicity within normal limits. 2.  Small left renal cyst seen. 3.  Diffuse fatty infiltration within the liver.   Original Report Authenticated By: Tonia Ghent, M.D.   Dg Chest Port 1 View  05/16/2013   *RADIOLOGY REPORT*  Clinical Data: Placement of right-sided hemodialysis catheter.  PORTABLE CHEST - 1 VIEW  Comparison: Chest radiograph performed earlier today at 05:26 p.m.  Findings: The patient's right-sided dual lumen catheter is noted ending about the distal SVC.  The lungs are relatively well expanded.  Bibasilar atelectasis or scarring is again noted.  No pleural effusion or pneumothorax is seen.  The cardiomediastinal silhouette is mildly enlarged.  No acute osseous abnormalities are identified.  IMPRESSION:  1.  Right-sided dual lumen catheter noted ending about the distal SVC. 2.  Bibasilar atelectasis or scarring again noted; lungs otherwise clear. 3.  Mild cardiomegaly.   Original Report Authenticated By: Tonia Ghent, M.D.   Dg Abd Acute W/chest  05/16/2013   CLINICAL DATA:  Status post colectomy and ileostomy reversal. Abdominal infection. Dizziness, weakness and nausea. Firm and distended abdomen.  EXAM: ACUTE ABDOMEN SERIES (ABDOMEN 2 VIEW & CHEST 1 VIEW)  COMPARISON:  Multiple priors, most recently CT of the abdomen and pelvis 05/08/2013.  FINDINGS: Lung volumes are low and there are linear opacities in lung bases bilaterally, most compatible with  subsegmental atelectasis. No definite consolidative airspace disease. No pleural effusions. No evidence of pulmonary edema. Heart size is normal. Mediastinal contours are unremarkable.  There are numerous dilated loops of small bowel throughout the abdomen, measuring up to approximately 5.4 cm in diameter. There appears to be some distal rectal gas. A suture line is noted in the central pelvis, likely a side of ileorectal anastomosis. Surgical clips also project over the right side of the abdomen and skin staples project over the low anatomic pelvis. No frank pneumoperitoneum is identified on the left lateral decubitus view. However, there are multiple air-fluid levels within the dilated loops of small bowel.  IMPRESSION: *The appearance of the abdomen is suggestive of a potential small bowel obstruction. However, given the appearance on the prior CT scan and the patient's postoperative state, the possibility of an ileus warrants consider consideration. Clinical correlation is recommended. *No pneumoperitoneum. *Low lung volumes with bibasilar subsegmental atelectasis.   Electronically Signed   By: Trudie Reed M.D.   On: 05/16/2013 17:49    Microbiology: Recent Results (from the past 240 hour(s))  URINE CULTURE     Status: None   Collection Time    05/16/13  5:25 PM      Result Value Range Status   Specimen Description URINE, CLEAN CATCH   Final   Special Requests Normal   Final   Culture  Setup Time     Final   Value: 05/16/2013 21:15     Performed at [facility] Count     Final   Value: NO GROWTH     Performed at [facility]   Culture     Final   Value: NO GROWTH     Performed at [facility]  Report Status 05/17/2013 FINAL   Final  MRSA PCR SCREENING     Status: None   Collection Time    05/16/13  7:23 PM      Result Value Range Status   MRSA by PCR NEGATIVE  NEGATIVE Final   Comment:            The GeneXpert MRSA Assay (FDA     approved for  NASAL specimens     only), is one component of a     comprehensive MRSA colonization     surveillance program. It is not     intended to diagnose MRSA     infection nor to guide or     monitor treatment for     MRSA infections.  CLOSTRIDIUM DIFFICILE BY PCR     Status: None   Collection Time    05/16/13  7:24 PM      Result Value Range Status   C difficile by pcr NEGATIVE  NEGATIVE Final     Labs: Basic Metabolic Panel:  Recent Labs Lab 05/17/13 0500 05/17/13 1113 05/17/13 1359 05/18/13 0720 05/19/13 0520 05/20/13 0345  NA 135 134* 134* 138 135 137  K 5.7* 5.7* 5.3* 4.7 4.0 4.0  CL 95* 94* 96 101 100 103  CO2 30 30 27 27 23 22   GLUCOSE 118* 111* 107* 98 104* 102*  BUN 30* 30* 30* 27* 25* 18  CREATININE 2.98* 2.70* 2.50* 1.80* 1.27 1.03  CALCIUM 9.0 9.1 8.8 8.9 8.9 8.6  MG 2.0  --   --  2.0 1.9  --   PHOS  --   --   --  4.6  --  3.3   Liver Function Tests:  Recent Labs Lab 05/16/13 1325 05/16/13 1500 05/17/13 0500 05/18/13 0720 05/20/13 0345  AST HEMOLYZED SPECIMEN, RESULTS MAY BE AFFECTED 22 18 20   --   ALT HEMOLYZED SPECIMEN, RESULTS MAY BE AFFECTED 56* 49 47  --   ALKPHOS HEMOLYZED SPECIMEN, RESULTS MAY BE AFFECTED 135* 117 111  --   BILITOT HEMOLYZED SPECIMEN, RESULTS MAY BE AFFECTED 0.5 1.0 0.7  --   PROT HEMOLYZED SPECIMEN, RESULTS MAY BE AFFECTED 7.6 6.8 6.6  --   ALBUMIN HEMOLYZED SPECIMEN, RESULTS MAY BE AFFECTED 3.5 3.2* 3.0* 3.1*    Recent Labs Lab 05/16/13 1325 05/16/13 1500  LIPASE HEMOLYZED SPECIMEN, RESULTS MAY BE AFFECTED 28   CBC:  Recent Labs Lab 05/16/13 1325 05/16/13 1757 05/16/13 1958 05/17/13 0500 05/18/13 0720 05/19/13 0520  WBC 7.4  --  8.5 6.5 4.8 4.9  NEUTROABS 5.2  --   --  4.3 2.4 2.5  HGB 14.5 14.6 13.6 12.0* 10.9* 11.7*  HCT 42.9 43.0 39.3 34.9* 33.2* 34.7*  MCV 89.4  --  88.5 87.3 89.7 90.4  PLT 685*  --  583* 482* 404* 399   Cardiac Enzymes:  Recent Labs Lab 05/16/13 1750 05/16/13 1958 05/17/13 0147  05/17/13 0947 05/17/13 1200  TROPONINI <0.30 <0.30 0.39* <0.30 <0.30   CBG:  Recent Labs Lab 05/16/13 1704  GLUCAP 102*     Signed:  Wells Guiles [place], [place]  [phone] [facility] 05/20/2013, 11:10 AM

## 2013-05-20 NOTE — Telephone Encounter (Signed)
Father is calling asking for Post op appt sooner than 06-04-13. Patient discharged from [facility]( dx nephropathy)  this am with instructions to follow up with Dr. Johna Sheriff in one week , He will seeing Dr. Yetta Barre 05-26-13 per Father. Please advise

## 2013-05-20 NOTE — Progress Notes (Signed)
NURSING PROGRESS NOTE  Rick Edwards [id number] Discharge Data: 05/20/2013 11:21 AM Attending Provider: [name], MD WUJ:[id number] Yetta Barre, MD     Carlus Pavlov to be D/C'd Home per MD order.  Discussed with the patient the After Visit Summary and all questions fully answered. All IV's discontinued with no bleeding noted. All belongings returned to patient for patient to take home.   Last Vital Signs:  Blood pressure 146/85, pulse 85, temperature 99.2 F (37.3 C), temperature source Oral, resp. rate 18, height 5\' 9"  (1.753 m), weight 79.9 kg (176 lb 2.4 oz), SpO2 98.00%.  Discharge Medication List   Medication List    STOP taking these medications       amoxicillin-clavulanate 875-125 MG per tablet  Commonly known as:  AUGMENTIN     Olmesartan-Amlodipine-HCTZ 20-5-12.5 MG Tabs  Commonly known as:  TRIBENZOR      TAKE these medications       azaTHIOprine 50 MG tablet  Commonly known as:  IMURAN  Take 75 mg by mouth daily.     enoxaparin 80 MG/0.8ML injection  Commonly known as:  LOVENOX  Inject 0.8 mLs (80 mg total) into the skin every 12 (twelve) hours.     loperamide 2 MG capsule  Commonly known as:  IMODIUM  Take 1 capsule (2 mg total) by mouth as needed for diarrhea or loose stools.     oxyCODONE-acetaminophen 5-325 MG per tablet  Commonly known as:  PERCOCET/ROXICET  Take 1-2 tablets by mouth every 6 (six) hours as needed.     potassium chloride SA 20 MEQ tablet  Commonly known as:  K-DUR,KLOR-CON  Take 1 tablet (20 mEq total) by mouth daily.     warfarin 5 MG tablet  Commonly known as:  COUMADIN  Take 5 mg by mouth daily. Take 1 tablet (5mg ) every Wed and Saturday and 1.5 tablets (7.5mg ) all other days of the week.

## 2013-05-20 NOTE — Discharge Summary (Signed)
  I have directly reviewed the clinical findings, lab, imaging studies and management of this patient in detail. I have interviewed and examined the patient and agree with the documentation,  as recorded by the Physician extender.  [name] M.D on 05/20/2013 at 11:11 AM  [facility] Office  [phone]

## 2013-05-20 NOTE — Telephone Encounter (Signed)
Spoke to Dr. Johna Sheriff regarding appointment for next week.  Dr. Johna Sheriff is DOW next week but requested that patient have an afternoon appointment on Wed and he would try to come over and see patient.  But, if Dr. Jamse Mead not available at that time then patient to continue appointment with provider scheduled with for that day.  Also, reviewed with Dr. Johna Sheriff the patients request to eat skinless chicken.  Per Dr. Johna Sheriff patient to continue on liquids until abdominal distention has resolved.  CBC w/Diff and BMET to be ordered prior to office visit next week.  Patient scheduled to see Dr. Janee Morn on Wed 05/28/13 @ 11:25 am.  Will notify patient's father [name]) and Dr. Johna Sheriff.

## 2013-05-20 NOTE — Progress Notes (Signed)
ANTICOAGULATION CONSULT NOTE - Follow Up Consult  Pharmacy Consult for Warfarin + Lovenox Indication: ATIII Deficiency, Hx arterial thrombi  Allergies  Allergen Reactions  . Mesalamine     REACTION: Rash   Patient Measurements: Height: 5\' 9"  (175.3 cm) Weight: 176 lb 2.4 oz (79.9 kg) IBW/kg (Calculated) : 70.7 Vital Signs: Temp: 99.2 F (37.3 C) (10/14 0526) Temp src: Oral (10/14 0526) BP: 146/85 mmHg (10/14 0526) Pulse Rate: 85 (10/14 0526) Labs:  Recent Labs  05/17/13 1200  05/18/13 0720 05/19/13 0520 05/20/13 0345  HGB  --   --  10.9* 11.7*  --   HCT  --   --  33.2* 34.7*  --   PLT  --   --  404* 399  --   LABPROT  --   --  14.7 15.2 15.8*  INR  --   --  1.17 1.23 1.29  CREATININE  --   < > 1.80* 1.27 1.03  TROPONINI <0.30  --   --   --   --   < > = values in this interval not displayed.  Estimated Creatinine Clearance: 82 ml/min (by C-G formula based on Cr of 1.03).  Assessment: 32 YOM with antithrombin III deficiency and history of arterial thrombi on warfarin and Lovenox overlap day #4. INR today is 1.29 (minimal INR response after 3 days of coumadin 7.5mg ). SCr improving at 1.03, estimated CrCl ~80 ml/min. Patient noted for discharge today.   Home regimen: 5 mg Wed/Sat and 7.5 mg all other days  An anti-Xa level was checked today due to history of antithrombin II deficiency. The Xa level was 1.43 (goal is 0.5-1.2) when on lovenox 1mg /kg q12h.  With antithrombin deficiency would expect a low anti-Xa level and this patient is clearly responding to therapy.  Elevated level likely reflects recent AKI which has now resolved and SCr continues to improve.   Goal of Therapy:  INR 2-3 Anti-Xa level 0.6-1.2 units/ml 4hrs after LMWH dose given if needed Monitor platelets by anticoagulation protocol: Yes   Plan:  -Could consider 2 days of coumadin 10mg  then resume home regimen with close INR  follow-up -Will adjust lovenox to 80mg  sq q12h (this adjustment is primarily  for ease of administration) and would continue this at discharge -Would not recommend further anti-Xa levels  Harland German, Pharm D 05/20/2013 10:27 AM

## 2013-05-20 NOTE — Progress Notes (Signed)
Patient ID: Rick Edwards, male   DOB: [birth year], 56 y.o.   MRN: [id number]   Pt without complaints this AM.  Discharge anticipated today.  Tolerating regular diet without nausea, 4-5 BMs per day.  Some occ cramping  Exam BP 146/85  Pulse 85  Temp(Src) 99.2 F (37.3 C) (Oral)  Resp 18  Ht 5\' 9"  (1.753 m)  Wt 176 lb 2.4 oz (79.9 kg)  BMI 26 kg/m2  SpO2 98% Alert and comfortable Abd moderately distended, soft and non tender Miidline wound with clean granulation  Lab Results  Component Value Date   WBC 4.9 05/19/2013   HGB 11.7* 05/19/2013   HCT 34.7* 05/19/2013   MCV 90.4 05/19/2013   PLT 399 05/19/2013   Lab Results  Component Value Date   CREATININE 1.03 05/20/2013   BUN 18 05/20/2013   NA 137 05/20/2013   K 4.0 05/20/2013   CL 103 05/20/2013   CO2 22 05/20/2013    A/P  ARF, IV contrast and dehydration, resolved.  Appreciate medical team care  Over 1 month post ileostomy takedown.  Still moderate distention on KUB and exam,  posssible partial SBO.  Pt however asymptomatic Will continue with full liquid diet only at home for now, pt instructed.  Will F/U in office next week

## 2013-05-20 NOTE — Care Management Note (Signed)
   CARE MANAGEMENT NOTE 05/20/2013  Patient:  Rick Edwards, Rick Edwards   Account Number:  [id number]  Date Initiated:  05/19/2013  Documentation initiated by:  Azalynn Maxim  Subjective/Objective Assessment:   HH needs.     Action/Plan:   Pt resides with his parents and plans to return to their home. Active with [facility] for [facility] prior to admission and wishes to continue with [facility]. [facility] notified of d/c needs.   Anticipated DC Date:  05/20/2013   Anticipated DC Plan:  HOME W HOME HEALTH SERVICES         Choice offered to / List presented to:          [facility] arranged  HH-1 RN  HH-2 PT      [facility] agency  [facility]   Status of service:  Completed, signed off Medicare Important Message given?   (If response is "NO", the following Medicare IM given date fields will be blank) Date Medicare IM given:   Date Additional Medicare IM given:    Discharge Disposition:  HOME W HOME HEALTH SERVICES  Per UR Regulation:    If discussed at Long Length of Stay Meetings, dates discussed:    Comments:

## 2013-05-22 ENCOUNTER — Telehealth (INDEPENDENT_AMBULATORY_CARE_PROVIDER_SITE_OTHER): Payer: Self-pay | Admitting: *Deleted

## 2013-05-22 NOTE — Telephone Encounter (Signed)
Scott from [facility] called stating he will be doing protocol saline moist to dry dsg for pt today since he has not formal orders for wd care. He states pt no longer has coverage for [facility] and he will be teaching father to do wd care daily. He will confirm pts fathers ability to do wd care.  Lorin Picket will call back once he sees wd today for any concerns or recommendations. He will remind pt to keep appt Weds with our office and to contact our office with any wd changes or concerns.

## 2013-05-22 NOTE — Telephone Encounter (Signed)
Received a call from Dr. Madilyn Fireman' office downstairs stating that the patient came to them for a dressing change.  Patient told them that we sent him there to do dressing changes.  Explained that the hospital had set patient up with [facility] to continue coming out to see patient.  It states that was setup for 05/20/13.  Informed her that we would not have sent a patient to have dressing changes done by them.  She will call [facility] to try to figure out what is going on.

## 2013-05-23 ENCOUNTER — Ambulatory Visit (INDEPENDENT_AMBULATORY_CARE_PROVIDER_SITE_OTHER): Payer: No Typology Code available for payment source | Admitting: General Practice

## 2013-05-23 DIAGNOSIS — Z7901 Long term (current) use of anticoagulants: Secondary | ICD-10-CM

## 2013-05-23 DIAGNOSIS — I743 Embolism and thrombosis of arteries of the lower extremities: Secondary | ICD-10-CM

## 2013-05-26 ENCOUNTER — Encounter: Payer: Self-pay | Admitting: Internal Medicine

## 2013-05-26 ENCOUNTER — Other Ambulatory Visit (INDEPENDENT_AMBULATORY_CARE_PROVIDER_SITE_OTHER): Payer: No Typology Code available for payment source

## 2013-05-26 ENCOUNTER — Ambulatory Visit (INDEPENDENT_AMBULATORY_CARE_PROVIDER_SITE_OTHER): Payer: No Typology Code available for payment source | Admitting: Internal Medicine

## 2013-05-26 VITALS — BP 118/80 | HR 86 | Temp 98.0°F | Resp 16 | Ht 69.0 in | Wt 178.0 lb

## 2013-05-26 DIAGNOSIS — D6859 Other primary thrombophilia: Secondary | ICD-10-CM

## 2013-05-26 DIAGNOSIS — I743 Embolism and thrombosis of arteries of the lower extremities: Secondary | ICD-10-CM

## 2013-05-26 DIAGNOSIS — K5010 Crohn's disease of large intestine without complications: Secondary | ICD-10-CM

## 2013-05-26 DIAGNOSIS — I10 Essential (primary) hypertension: Secondary | ICD-10-CM

## 2013-05-26 DIAGNOSIS — N1831 Chronic kidney disease, stage 3a: Secondary | ICD-10-CM | POA: Insufficient documentation

## 2013-05-26 DIAGNOSIS — N183 Chronic kidney disease, stage 3 unspecified: Secondary | ICD-10-CM | POA: Insufficient documentation

## 2013-05-26 LAB — CBC WITH DIFFERENTIAL/PLATELET
Basophils Absolute: 0.1 10*3/uL (ref 0.0–0.1)
Basophils Absolute: 0.1 10*3/uL (ref 0.0–0.1)
Basophils Relative: 1 % (ref 0–1)
Eosinophils Absolute: 0.2 10*3/uL (ref 0.0–0.7)
Eosinophils Absolute: 0.3 10*3/uL (ref 0.0–0.7)
HCT: 42.6 % (ref 39.0–52.0)
HCT: 43.5 % (ref 39.0–52.0)
Hemoglobin: 14.7 g/dL (ref 13.0–17.0)
Lymphs Abs: 1.9 10*3/uL (ref 0.7–4.0)
Lymphs Abs: 2.3 10*3/uL (ref 0.7–4.0)
MCH: 29.7 pg (ref 26.0–34.0)
MCHC: 34.3 g/dL (ref 30.0–36.0)
MCHC: 33.8 g/dL (ref 30.0–36.0)
MCV: 88.6 fl (ref 78.0–100.0)
Monocytes Absolute: 0.8 10*3/uL (ref 0.1–1.0)
Monocytes Relative: 11.8 % (ref 3.0–12.0)
Neutro Abs: 2.9 10*3/uL (ref 1.7–7.7)
Neutro Abs: 3.6 10*3/uL (ref 1.4–7.7)
Neutrophils Relative %: 50 % (ref 43–77)
Neutrophils Relative %: 51 % (ref 43.0–77.0)
Platelets: 481 10*3/uL — ABNORMAL HIGH (ref 150–400)
RBC: 4.91 MIL/uL (ref 4.22–5.81)
RDW: 15.3 % — ABNORMAL HIGH (ref 11.5–14.6)

## 2013-05-26 LAB — BASIC METABOLIC PANEL
BUN: 39 mg/dL — ABNORMAL HIGH (ref 6–23)
CO2: 21 mEq/L (ref 19–32)
Calcium: 9.9 mg/dL (ref 8.4–10.5)
Creatinine, Ser: 2.5 mg/dL — ABNORMAL HIGH (ref 0.4–1.5)
GFR: 34.99 mL/min — ABNORMAL LOW (ref >60.00)
Glucose, Bld: 113 mg/dL — ABNORMAL HIGH (ref 70–99)
Potassium: 5.7 mEq/L — ABNORMAL HIGH (ref 3.5–5.1)
Sodium: 129 mEq/L — ABNORMAL LOW (ref 135–145)

## 2013-05-26 LAB — PROTIME-INR: Prothrombin Time: 30.7 s — ABNORMAL HIGH (ref 10.2–12.4)

## 2013-05-26 NOTE — Progress Notes (Signed)
Subjective:    Patient ID: Rick Edwards, male    DOB: 1956-06-29, 57 y.o.   MRN: [id number]  HPI Comments: Recommendations for Outpatient Follow-up:   Please recheck CBC, BMP, PT/INR in 1-2 days. Please change wound dressings twice daily- wound care home health PT home health   Discharge Diagnoses:  Principal Problem:   Acute renal failure Active Problems:   HYPERTENSION   CROHN'S DISEASE-LARGE INTESTINE   Long term (current) use of anticoagulants   Antithrombin 3 deficiency   HOH (hard of hearing)   Antithrombin III deficiency   Acute hyperkalemia   Generalized weakness   Nausea and vomiting   Hiccoughs Hypertension This is a chronic problem. The current episode started more than 1 year ago. The problem has been resolved since onset. The problem is controlled. Pertinent negatives include no anxiety, blurred vision, chest pain, headaches, malaise/fatigue, neck pain, orthopnea, palpitations, peripheral edema, PND, shortness of breath or sweats. Hypertensive end-organ damage includes kidney disease. Identifiable causes of hypertension include chronic renal disease.      Review of Systems  Constitutional: Positive for fatigue. Negative for fever, chills, malaise/fatigue, diaphoresis and appetite change.  HENT: Negative.   Eyes: Negative.  Negative for blurred vision.  Respiratory: Negative.  Negative for cough, choking, chest tightness, shortness of breath and stridor.   Cardiovascular: Negative.  Negative for chest pain, palpitations, orthopnea, leg swelling and PND.  Gastrointestinal: Negative.  Negative for nausea, vomiting, abdominal pain, diarrhea, constipation and blood in stool.  Endocrine: Negative.   Genitourinary: Negative.   Musculoskeletal: Negative.  Negative for arthralgias, myalgias and neck pain.  Skin: Negative.   Allergic/Immunologic: Negative.   Neurological: Negative.  Negative for headaches.  Hematological: Negative.  Negative for adenopathy. Does not  bruise/bleed easily.  Psychiatric/Behavioral: Negative.        Objective:   Physical Exam  Vitals reviewed. Constitutional: He is oriented to person, place, and time. He appears well-developed and well-nourished. No distress.  HENT:  Head: Normocephalic and atraumatic.  Mouth/Throat: Oropharynx is clear and moist. No oropharyngeal exudate.  Eyes: Conjunctivae are normal. Right eye exhibits no discharge. Left eye exhibits no discharge. No scleral icterus.  Neck: Normal range of motion. Neck supple. No JVD present. No tracheal deviation present. No thyromegaly present.    Cardiovascular: Normal rate, regular rhythm, normal heart sounds and intact distal pulses.  Exam reveals no gallop and no friction rub.   No murmur heard. Pulmonary/Chest: Effort normal and breath sounds normal. No stridor. No respiratory distress. He has no wheezes. He has no rales. He exhibits no tenderness.  Abdominal: Soft. Normal appearance and bowel sounds are normal. He exhibits no distension and no mass. There is no hepatosplenomegaly, splenomegaly or hepatomegaly. There is no tenderness. There is no rigidity, no rebound, no guarding, no CVA tenderness, no tenderness at McBurney's point and negative Murphy's sign. No hernia. Hernia confirmed negative in the ventral area, confirmed negative in the right inguinal area and confirmed negative in the left inguinal area.    Musculoskeletal: Normal range of motion. He exhibits no edema and no tenderness.  Lymphadenopathy:    He has no cervical adenopathy.  Neurological: He is oriented to person, place, and time.  Skin: Skin is warm and dry. No rash noted. He is not diaphoretic. No erythema. No pallor.  Psychiatric: He has a normal mood and affect. His behavior is normal. Judgment and thought content normal.      Lab Results  Component Value Date   WBC 4.9  05/19/2013   HGB 11.7* 05/19/2013   HCT 34.7* 05/19/2013   PLT 399 05/19/2013   GLUCOSE 102* 05/20/2013    CHOL 129 03/18/2012   TRIG 186* 03/18/2012   HDL 42.90 09/08/2011   LDLCALC 137* 09/08/2011   ALT 47 05/18/2013   AST 20 05/18/2013   NA 137 05/20/2013   K 4.0 05/20/2013   CL 103 05/20/2013   CREATININE 1.03 05/20/2013   BUN 18 05/20/2013   CO2 22 05/20/2013   TSH 1.31 09/08/2011   PSA 3.86 08/15/2012   INR 1.7 05/23/2013      Assessment & Plan:

## 2013-05-26 NOTE — Patient Instructions (Signed)
Anemia, Nonspecific  Your exam and blood tests show you are anemic. This means your blood (hemoglobin) level is low. Normal hemoglobin values are 12 to 15 g/dL for females and 14 to 17 g/dL for males. Make a note of your hemoglobin level today. The hematocrit percent is also used to measure anemia. A normal hematocrit is 38% to 46% in females and 42% to 49% in males. Make a note of your hematocrit level today.  CAUSES   Anemia can be due to many different causes.   Excessive bleeding from periods (in women).   Intestinal bleeding.   Poor nutrition.   Kidney, thyroid, liver, and bone marrow diseases.  SYMPTOMS   Anemia can come on suddenly (acute). It can also come on slowly. Symptoms can include:   Minor weakness.   Dizziness.   Palpitations.   Shortness of breath.  Symptoms may be absent until half your hemoglobin is missing if it comes on slowly. Anemia due to acute blood loss from an injury or internal bleeding may require blood transfusion if the loss is severe. Hospital care is needed if you are anemic and there is significant continual blood loss.  TREATMENT    Stool tests for blood (Hemoccult) and additional lab tests are often needed. This determines the best treatment.   Further checking on your condition and your response to treatment is very important. It often takes many weeks to correct anemia.  Depending on the cause, treatment can include:   Supplements of iron.   Vitamins B12 and folic acid.   Hormone medicines.  If your anemia is due to bleeding, finding the cause of the blood loss is very important. This will help avoid further problems.  SEEK IMMEDIATE MEDICAL CARE IF:    You develop fainting, extreme weakness, shortness of breath, or chest pain.   You develop heavy vaginal bleeding.   You develop bloody or black, tarry stools or vomit up blood.   You develop a high fever, rash, repeated vomiting, or dehydration.  Document Released: 08/31/2004 Document Revised: 10/16/2011 Document  Reviewed: 06/08/2009  [employer] Patient Information 2014 [employer]

## 2013-05-27 ENCOUNTER — Encounter (HOSPITAL_COMMUNITY): Payer: Self-pay | Admitting: General Practice

## 2013-05-27 ENCOUNTER — Telehealth (INDEPENDENT_AMBULATORY_CARE_PROVIDER_SITE_OTHER): Payer: Self-pay

## 2013-05-27 ENCOUNTER — Emergency Department (HOSPITAL_COMMUNITY): Admission: EM | Admit: 2013-05-27 | Payer: No Typology Code available for payment source | Source: Home / Self Care

## 2013-05-27 ENCOUNTER — Encounter (HOSPITAL_COMMUNITY): Payer: Self-pay | Admitting: Emergency Medicine

## 2013-05-27 ENCOUNTER — Inpatient Hospital Stay (HOSPITAL_COMMUNITY)
Admission: AD | Admit: 2013-05-27 | Discharge: 2013-05-29 | DRG: 683 | Disposition: A | Discharge status: Home / Self Care | Payer: Medicaid Other | Source: Ambulatory Visit | Attending: Internal Medicine | Admitting: Internal Medicine

## 2013-05-27 DIAGNOSIS — I743 Embolism and thrombosis of arteries of the lower extremities: Secondary | ICD-10-CM

## 2013-05-27 DIAGNOSIS — D735 Infarction of spleen: Secondary | ICD-10-CM

## 2013-05-27 DIAGNOSIS — Z9049 Acquired absence of other specified parts of digestive tract: Secondary | ICD-10-CM

## 2013-05-27 DIAGNOSIS — N1831 Chronic kidney disease, stage 3a: Secondary | ICD-10-CM | POA: Diagnosis present

## 2013-05-27 DIAGNOSIS — D126 Benign neoplasm of colon, unspecified: Secondary | ICD-10-CM

## 2013-05-27 DIAGNOSIS — H9190 Unspecified hearing loss, unspecified ear: Secondary | ICD-10-CM | POA: Diagnosis present

## 2013-05-27 DIAGNOSIS — Z7901 Long term (current) use of anticoagulants: Secondary | ICD-10-CM

## 2013-05-27 DIAGNOSIS — E875 Hyperkalemia: Secondary | ICD-10-CM | POA: Diagnosis present

## 2013-05-27 DIAGNOSIS — I10 Essential (primary) hypertension: Secondary | ICD-10-CM | POA: Diagnosis present

## 2013-05-27 DIAGNOSIS — D6859 Other primary thrombophilia: Secondary | ICD-10-CM | POA: Diagnosis present

## 2013-05-27 DIAGNOSIS — R972 Elevated prostate specific antigen [PSA]: Secondary | ICD-10-CM

## 2013-05-27 DIAGNOSIS — I129 Hypertensive chronic kidney disease with stage 1 through stage 4 chronic kidney disease, or unspecified chronic kidney disease: Secondary | ICD-10-CM | POA: Diagnosis present

## 2013-05-27 DIAGNOSIS — Z79899 Other long term (current) drug therapy: Secondary | ICD-10-CM

## 2013-05-27 DIAGNOSIS — Z87891 Personal history of nicotine dependence: Secondary | ICD-10-CM

## 2013-05-27 DIAGNOSIS — K5090 Crohn's disease, unspecified, without complications: Secondary | ICD-10-CM | POA: Diagnosis present

## 2013-05-27 DIAGNOSIS — N189 Chronic kidney disease, unspecified: Secondary | ICD-10-CM

## 2013-05-27 DIAGNOSIS — N179 Acute kidney failure, unspecified: Principal | ICD-10-CM | POA: Diagnosis present

## 2013-05-27 DIAGNOSIS — K5010 Crohn's disease of large intestine without complications: Secondary | ICD-10-CM | POA: Diagnosis present

## 2013-05-27 DIAGNOSIS — N183 Chronic kidney disease, stage 3 unspecified: Secondary | ICD-10-CM | POA: Diagnosis present

## 2013-05-27 DIAGNOSIS — S98139A Complete traumatic amputation of one unspecified lesser toe, initial encounter: Secondary | ICD-10-CM

## 2013-05-27 HISTORY — DX: Hyperkalemia: E87.5

## 2013-05-27 HISTORY — DX: Chronic kidney disease, unspecified: N18.9

## 2013-05-27 HISTORY — DX: Hypercalcemia: E83.52

## 2013-05-27 LAB — BASIC METABOLIC PANEL
CO2: 21 mEq/L (ref 19–32)
Calcium: 10.7 mg/dL — ABNORMAL HIGH (ref 8.4–10.5)
Chloride: 97 mEq/L (ref 96–112)
Glucose, Bld: 97 mg/dL (ref 70–99)
Potassium: 6.2 mEq/L — ABNORMAL HIGH (ref 3.5–5.3)
Sodium: 130 mEq/L — ABNORMAL LOW (ref 135–145)

## 2013-05-27 LAB — POTASSIUM: Potassium: 5.2 mEq/L — ABNORMAL HIGH (ref 3.5–5.1)

## 2013-05-27 MED ORDER — AZATHIOPRINE 50 MG PO TABS
75.0000 mg | ORAL_TABLET | Freq: Every day | ORAL | Status: DC
  Administered 2013-05-28 – 2013-05-29 (×2): 75 mg via ORAL
  Filled 2013-05-27 (×3): qty 2

## 2013-05-27 MED ORDER — LOPERAMIDE HCL 2 MG PO CAPS: 2.0000 mg | ORAL_CAPSULE | ORAL | Status: DC | PRN

## 2013-05-27 MED ORDER — ONDANSETRON HCL 4 MG PO TABS: 4.0000 mg | ORAL_TABLET | Freq: Four times a day (QID) | ORAL | Status: DC | PRN

## 2013-05-27 MED ORDER — WARFARIN - PHARMACIST DOSING INPATIENT
Freq: Every day | Status: DC
  Administered 2013-05-27: 18:00:00

## 2013-05-27 MED ORDER — ONDANSETRON HCL 4 MG/2ML IJ SOLN: 4.0000 mg | Freq: Four times a day (QID) | INTRAMUSCULAR | Status: DC | PRN

## 2013-05-27 MED ORDER — SODIUM CHLORIDE 0.9 % IJ SOLN: 3.0000 mL | Freq: Two times a day (BID) | INTRAMUSCULAR | Status: DC

## 2013-05-27 MED ORDER — WARFARIN SODIUM 5 MG PO TABS
5.0000 mg | ORAL_TABLET | Freq: Once | ORAL | Status: AC
  Administered 2013-05-27: 18:00:00 5 mg via ORAL
  Filled 2013-05-27: qty 1

## 2013-05-27 MED ORDER — SODIUM CHLORIDE 0.9 % IV SOLN
INTRAVENOUS | Status: DC
  Administered 2013-05-27 – 2013-05-29 (×5): via INTRAVENOUS

## 2013-05-27 NOTE — Telephone Encounter (Addendum)
Per Dr. Johna Sheriff patient need's to be admitted to [facility] for further management of hyperkalcemia, renal failure.  Called and spoke to [facility] Manager @ [facility], advised that Team 7 will be contacting Maryan Puls, CMA for Dr. Johna Sheriff at [facility] to get further patient information.  Called patient's father [name]) and advised him that Andi will need to be admitted due to recent abnormal labs.  Advised to take patient to [facility] Admitting.  I will await call from Team 7 to give patient demographics for admitting.

## 2013-05-27 NOTE — Progress Notes (Signed)
57 year old gentleman coming in for direct admission for acute renal failure, hypercalcemia and hyperkalemia.  He will be admitted to telemetry, and to team 10 on [facility] service.   Kathlen Mody, MD [phone]

## 2013-05-27 NOTE — Assessment & Plan Note (Signed)
His INR is perfect at 3

## 2013-05-27 NOTE — ED Notes (Signed)
Received call from bed placement-- pt is to be a direct admit. Has room 5W-13.

## 2013-05-27 NOTE — Progress Notes (Signed)
Patient ID: COREE BRAME, male   DOB: May 05, 1956, 56 y.o.   MRN: [id number]  Patient is well-known to me with an extensive history of Crohn's disease and recently status post takedown of ileostomy with ileorectal anastomosis approximately 2 months ago. This was complicated by prolonged ileus or possibly partial distal bowel obstruction and a pelvic abscess. Anastomotic leak was suspected but not confirmed. This improved on antibiotics and he was discharged home. He was readmitted about 2 weeks ago with acute renal insufficiency and hyperkalemia felt secondary to contrast nephropathy and dehydration. This resolved quickly with IV rehydration. He was tolerating a liquid diet well at that time although still had some small bowel distention and was discharged on a liquid diet.  On routine followup lab today the patient again has been found to have elevated creatinine and potassium with some acidosis. He is admitted to the medical service for treatment. He denies any abdominal or GI complaints. He states he is able to drink as much as he wants. He is having 2 or 3 soft formed bowel movements per day. No abdominal pain or nausea or distention.  On examination he is comfortable. His abdomen is soft and nondistended.  I do not see any evidence of ongoing ileus or obstruction or abdominal source for his problems. I think it is okay to advance to a regular diet. I will follow him while he is in the hospital.  [facility].D.

## 2013-05-27 NOTE — Telephone Encounter (Signed)
Incoming call Team 10 [facility] Hospitalist) Patient has been accepted by Dr. Ivor Reining Team 10 and will be admitted.

## 2013-05-27 NOTE — Assessment & Plan Note (Signed)
He has a pre-renal azotemia with apparent dehydration though he does not offer any symptoms of fluid loss or poor po intake I have asked him to increase his intake of liquids and to see renal medicine for a more complete evaluation of his renal function

## 2013-05-27 NOTE — H&P (Signed)
[facility] History and Physical  Rick Edwards BJY:[id number] DOB: [birth year] DOA: 05/27/2013  Referring physician: Dr Johna Sheriff PCP: Sanda Linger, MD  Specialists: Dr Johna Sheriff  Chief Complaint: sent in for direct admission for abnormal labs.   HPI: Rick Edwards is a 57 y.o. male with prior h/p crohns disease, s/p colectomy, ileostomy, with ileorectal anastomosis 2months ago, recent h/o pelvis abscess and resolution, was recently admitted for acute renal failure and hyperkalemia  And discharged after resolution of the above, came in for abnormal labs including hyperkalemia and ARF, . He remains asymptomatic. He was referred to medical admission.    Review of Systems: The patient denies anorexia, fever, weight loss,, vision loss, decreased hearing, hoarseness, chest pain, syncope, dyspnea on exertion, peripheral edema, balance deficits, hemoptysis, abdominal pain, melena, hematochezia, severe indigestion/heartburn, hematuria, incontinence, genital sores, muscle weakness, suspicious skin lesions, transient blindness, difficulty walking, depression, unusual weight change, abnormal bleeding, enlarged lymph nodes, angioedema, and breast masses.    Past Medical History  Diagnosis Date  . Crohn's disease     tx. Imuran  . Perianal abscess 2001    s/p right hemicolectomy, and drainage of retroperitoneal abcess 2003  . Hypertension   . Hearing loss     wears hearing aid left ear; birthed with hearing loss  . GI bleed 6/11    w/ normal EGD 6/11, 3 PRBCs   . Anemia     anemia thrombocytopenia, saw Hematology 2011, can not r/o myeloproliferative d/c  . Transfusion history     last 8'13  . Ileostomy in place 04-11-13    04-18-13 ileostomy to be taken down.  Marland Kitchen HOH (hard of hearing) 05/16/2013  . Antithrombin III deficiency 05/16/2013    On coumadin for this  . Chronic kidney disease 05/27/2013    ACUTE RENAL FAILURE   . Hypercalcemia   . Hyperkalemia 05/27/2013   Past Surgical  History  Procedure Laterality Date  . Hemicolectomy  08/29/2001    perforated abscess  . Anal fistulotomy  2002  . Partial colectomy  03/11/2012    Procedure: PARTIAL COLECTOMY;  Surgeon: Mariella Saa, MD;  Location: [facility];  Service: General;  Laterality: N/A;  subtotal colectomy transverse and left colon   . Embolectomy  03/12/2012    Procedure: EMBOLECTOMY;  Surgeon: Chuck Hint, MD;  Location: [facility] OR;  Service: Vascular;  Laterality: Right;  Right popliteal embolectomy with vein patch angioplasty, right posterior tibial embolectomy with vein patch angioplasty   . Intraoperative arteriogram  03/12/2012    Procedure: INTRA OPERATIVE ARTERIOGRAM;  Surgeon: Chuck Hint, MD;  Location: [facility] OR;  Service: Vascular;  Laterality: Right;  . Transmetatarsal amputation  05/10/2012    right  . Ileostomy closure N/A 04/18/2013    Procedure: ILEOSTOMY TAKEDOWN;  Surgeon: Mariella Saa, MD;  Location: [facility];  Service: General;  Laterality: N/A;   Social History:  reports that he quit smoking about 2 years ago. His smoking use included Cigarettes. He has a 2.25 pack-year smoking history. He has never used smokeless tobacco. He reports that he does not drink alcohol or use illicit drugs.  where does patient live--home,  Can patient participate in ADLs? yes  Allergies  Allergen Reactions  . Mesalamine     REACTION: Rash    Family History  Problem Relation Age of Onset  . Coronary artery disease Neg Hx   . Diabetes Neg Hx   . Colon cancer Neg Hx   . Prostate cancer Neg Hx   .  Stroke      GF, aunts   . Hypertension      "the whole family"  . Hypertension Mother   . Hyperlipidemia Mother   . Hypertension Father   . Hyperlipidemia Father   . Heart disease Father   . Kidney failure Neg Hx     Prior to Admission medications   Medication Sig Start Date End Date Taking? Authorizing Provider  azaTHIOprine (IMURAN) 50 MG tablet Take 75 mg by mouth daily.   Yes Historical  Provider, MD  loperamide (IMODIUM) 2 MG capsule Take 1 capsule (2 mg total) by mouth as needed for diarrhea or loose stools. 05/20/13  Yes [name], MD  oxyCODONE-acetaminophen (PERCOCET/ROXICET) 5-325 MG per tablet Take 1-2 tablets by mouth every 6 (six) hours as needed. 05/13/13  Yes Mariella Saa, MD  potassium chloride SA (K-DUR,KLOR-CON) 20 MEQ tablet Take 1 tablet (20 mEq total) by mouth daily. 05/09/13  Yes Mariella Saa, MD  warfarin (COUMADIN) 5 MG tablet Take 5 mg by mouth daily. Take 1 tablet (5mg ) every Wed and Saturday and 1.5 tablets (7.5mg ) all other days of the week.   Yes Historical Provider, MD   Physical Exam: Filed Vitals:   05/27/13 1330  BP: 122/88  Pulse: 89  Temp: 98.3 F (36.8 C)  Resp: 16    Constitutional: Vital signs reviewed.  Patient is a well-developed and well-nourished  in no acute distress and cooperative with exam. Alert and oriented x3.  Head: Normocephalic and atraumatic Mouth: no erythema or exudates, MMM Eyes: PERRL, EOMI, conjunctivae normal, No scleral icterus.  Neck: Supple, Trachea midline normal ROM, No JVD, mass, thyromegaly, or carotid bruit present.  Cardiovascular: RRR, S1 normal, S2 normal, no MRG, pulses symmetric and intact bilaterally Pulmonary/Chest: normal respiratory effort, CTAB, no wheezes, rales, or rhonchi Abdominal: Soft. Non-tender, non-distended, bowel sounds are normal,midline abdominal  Wound Musculoskeletal: No joint deformities, erythema, or stiffness, ROM full and no nontender Neurological: A&O x3, Strength is normal and symmetric bilaterally, , no focal motor deficit, sensory intact to light touch bilaterally.  Skin: Warm, dry and intact. No rash, cyanosis, or clubbing.  Psychiatric: Normal mood and affect. speech and behavior is normal.   Labs on Admission:  Basic Metabolic Panel:  Recent Labs Lab 05/26/13 0958 05/26/13 1449  NA 130* 129*  K 6.2* 5.7*  CL 97 98  CO2 21 21  GLUCOSE 97 113*   BUN 39* 39*  CREATININE 2.48* 2.5*  CALCIUM 10.7* 9.9   Liver Function Tests: No results found for this basename: AST, ALT, ALKPHOS, BILITOT, PROT, ALBUMIN,  in the last 168 hours No results found for this basename: LIPASE, AMYLASE,  in the last 168 hours No results found for this basename: AMMONIA,  in the last 168 hours CBC:  Recent Labs Lab 05/26/13 1004 05/26/13 1449  WBC 5.7 7.1  NEUTROABS 2.9 3.6  HGB 14.6 14.7  HCT 42.6 43.5  MCV 86.8 88.6  PLT 481* 483.0*   Cardiac Enzymes: No results found for this basename: CKTOTAL, CKMB, CKMBINDEX, TROPONINI,  in the last 168 hours  BNP (last 3 results) No results found for this basename: PROBNP,  in the last 8760 hours CBG: No results found for this basename: GLUCAP,  in the last 168 hours  Radiological Exams on Admission: No results found.  EKG: NSR, left axis deviation  Assessment/Plan Active Problems: 1. Hyperkalemia:  - possibly from the potassium supplements he is taking - discontinued the supplements.  - EKG  does not show peaked t waves. - repeat K tonight.if still elevated, woul dgive a dose of kayexalate.  - admitted to telemetry and watch overnight.   2. Acute renalfailure: possibly from dehydration and decreased po intake - gentle hydration and repeat renal parameters at home.   3. H/o ileorectal anastomosis & pelvic abscess with mid line abd wound: daily dressings. Will consult wound care for dressing recommendations. Dr Johna Sheriff on board.   4. crohs disease: no diarrhea. Resume home medications.   5/ anti thrombin deficiency: supra therapeutic INR.  - PHARMACY to dose coumadin.   6. Hypertension: controlled  DVT prophylaxis.  Code Status: full code Family Communication: none atbedside Disposition Plan: pending  Time spent: 55 min  Rick Edwards [facility] Pager [phone]  If 7PM-7AM, please contact night-coverage [email or web address] Password [email or web address] 05/27/2013, 5:45 PM

## 2013-05-27 NOTE — Assessment & Plan Note (Signed)
His BP is well controlled on no meds

## 2013-05-27 NOTE — ED Notes (Signed)
Pt was advised by [facility] to come to hospital for admission due to diarrhea and dehydration. Need to page [facility] team 7 for admission. No acute distress noted on arrival.

## 2013-05-27 NOTE — Progress Notes (Signed)
Pt admitted to room 5W13. Transported by family. Assessment completed. Oriented pt to room and unit and placed call bell in reach. Pt denied pain or concerns. VS WNL. Will continue to monitor pt closely.  Juliane Lack, RN

## 2013-05-27 NOTE — Progress Notes (Addendum)
ANTICOAGULATION CONSULT NOTE - Initial Consult  Pharmacy Consult for Coumadin Indication: Anti-thrombin def.  Allergies  Allergen Reactions  . Mesalamine     REACTION: Rash    Patient Measurements: Height: 5\' 9"  (175.3 cm) Weight: 165 lb (74.844 kg) IBW/kg (Calculated) : 70.7 Heparin Dosing Weight:    Vital Signs: Temp: 98.3 F (36.8 C) (10/21 1330) Temp src: Oral (10/21 1330) BP: 122/88 mmHg (10/21 1330) Pulse Rate: 89 (10/21 1330)  Labs:  Recent Labs  05/26/13 0958 05/26/13 1004 05/26/13 1449  HGB  --  14.6 14.7  HCT  --  42.6 43.5  PLT  --  481* 483.0*  LABPROT  --   --  30.7*  INR  --   --  3.0*  CREATININE 2.48*  --  2.5*    Estimated Creatinine Clearance: 33.8 ml/min (by C-G formula based on Cr of 2.5).   Medical History: Past Medical History  Diagnosis Date  . Crohn's disease     tx. Imuran  . Perianal abscess 2001    s/p right hemicolectomy, and drainage of retroperitoneal abcess 2003  . Hypertension   . Hearing loss     wears hearing aid left ear; birthed with hearing loss  . GI bleed 6/11    w/ normal EGD 6/11, 3 PRBCs   . Anemia     anemia thrombocytopenia, saw Hematology 2011, can not r/o myeloproliferative d/c  . Transfusion history     last 8'13  . Ileostomy in place 04-11-13    04-18-13 ileostomy to be taken down.  Marland Kitchen HOH (hard of hearing) 05/16/2013  . Antithrombin III deficiency 05/16/2013    On coumadin for this    Medications:  Prescriptions prior to admission  Medication Sig Dispense Refill  . azaTHIOprine (IMURAN) 50 MG tablet Take 75 mg by mouth daily.      Marland Kitchen loperamide (IMODIUM) 2 MG capsule Take 1 capsule (2 mg total) by mouth as needed for diarrhea or loose stools.  30 capsule  0  . oxyCODONE-acetaminophen (PERCOCET/ROXICET) 5-325 MG per tablet Take 1-2 tablets by mouth every 6 (six) hours as needed.  50 tablet  0  . potassium chloride SA (K-DUR,KLOR-CON) 20 MEQ tablet Take 1 tablet (20 mEq total) by mouth daily.  20 tablet   0  . warfarin (COUMADIN) 5 MG tablet Take 5 mg by mouth daily. Take 1 tablet (5mg ) every Wed and Saturday and 1.5 tablets (7.5mg ) all other days of the week.        Assessment: ARF, Hyper-Ca, Hyper-K+  57 yo w/ hx of Crohn's also readmitted 10/10 with acute renal failure and hyperkalemia requiring HD . Recent discharge 10/3 after intraabdominal abscess post op from ileostomy take down , discharged home on wound vac. Readmit pt 10/20 for management of above problems.  Patient is on chronic anticoagulation for Antithrombin III def. INR yesterday on 10/20 was 3. Will recheck INR in AM.  Goal of Therapy:  INR 2-3 Monitor platelets by anticoagulation protocol: Yes   Plan:  Give Coumadin 5mg  tonight Daily INR   Alegria Dominique S. Merilynn Finland, PharmD, BCPS Clinical Staff Pharmacist Pager [phone]  Misty Stanley Stillinger 05/27/2013,4:05 PM

## 2013-05-28 ENCOUNTER — Encounter (INDEPENDENT_AMBULATORY_CARE_PROVIDER_SITE_OTHER): Payer: No Typology Code available for payment source | Admitting: General Surgery

## 2013-05-28 DIAGNOSIS — K5010 Crohn's disease of large intestine without complications: Secondary | ICD-10-CM

## 2013-05-28 DIAGNOSIS — N179 Acute kidney failure, unspecified: Principal | ICD-10-CM

## 2013-05-28 LAB — URINALYSIS, ROUTINE W REFLEX MICROSCOPIC
Bilirubin Urine: NEGATIVE
Glucose, UA: NEGATIVE mg/dL
Ketones, ur: NEGATIVE mg/dL
Leukocytes, UA: NEGATIVE
Nitrite: NEGATIVE
Protein, ur: NEGATIVE mg/dL

## 2013-05-28 LAB — CBC
Hemoglobin: 12.6 g/dL — ABNORMAL LOW (ref 13.0–17.0)
MCH: 29.7 pg (ref 26.0–34.0)
MCHC: 34.5 g/dL (ref 30.0–36.0)
RDW: 14.7 % (ref 11.5–15.5)
WBC: 5.6 10*3/uL (ref 4.0–10.5)

## 2013-05-28 LAB — BASIC METABOLIC PANEL
BUN: 39 mg/dL — ABNORMAL HIGH (ref 6–23)
Creatinine, Ser: 1.81 mg/dL — ABNORMAL HIGH (ref 0.50–1.35)
GFR calc Af Amer: 47 mL/min — ABNORMAL LOW (ref >90)
GFR calc non Af Amer: 41 mL/min — ABNORMAL LOW (ref >90)
Potassium: 5.2 mEq/L — ABNORMAL HIGH (ref 3.5–5.1)

## 2013-05-28 LAB — PROTIME-INR: INR: 2.26 — ABNORMAL HIGH (ref 0.00–1.49)

## 2013-05-28 MED ORDER — SODIUM POLYSTYRENE SULFONATE 15 GM/60ML PO SUSP
30.0000 g | Freq: Once | ORAL | Status: AC
  Administered 2013-05-28: 30 g via ORAL
  Filled 2013-05-28: qty 120

## 2013-05-28 MED ORDER — WARFARIN SODIUM 5 MG PO TABS
5.0000 mg | ORAL_TABLET | Freq: Once | ORAL | Status: AC
  Administered 2013-05-28: 5 mg via ORAL
  Filled 2013-05-28: qty 1

## 2013-05-28 NOTE — Consult Note (Signed)
WOC wound consult note Reason for Consult: Consult requested for abd wound.  Pt had ileostomy reversal last month and used a wound vac to abd wound for awhile during the post-op phase.  [facility] has followed and recently assessed the wound.  Pt has been using wound gel at home. Wound type: Full thickness post-op wound Measurement:17X2X.5cm Wound bed:95% red and moist, 5% yellow.  Retention sutures visible.  2 staples removed from lower wound edge; pt states he was not aware they were there and have been in place for months. Drainage (amount, consistency, odor) Small amt pink drainage on previous dressing, no odor. Periwound: Intact skin surrounding. Dressing procedure/placement/frequency: Continue present plan of care with wound gel Q day to promote moist healing.  Pt states his father performs dressing changes at home.  He can resume follow-up with [facility] team after discharge. Please re-consult if further assistance is needed.  Thank-you,  Cammie Mcgee MSN, RN, CWOCN, [place], CNS [phone]

## 2013-05-28 NOTE — Progress Notes (Signed)
ANTICOAGULATION CONSULT NOTE - Follow-up  Pharmacy Consult for Coumadin Indication: Anti-thrombin def.  Allergies  Allergen Reactions  . Mesalamine     REACTION: Rash    Patient Measurements: Height: 5\' 9"  (175.3 cm) Weight: 165 lb (74.844 kg) IBW/kg (Calculated) : 70.7  Vital Signs: Temp: 98.2 F (36.8 C) (10/22 0451) Temp src: Oral (10/22 0451) BP: 125/80 mmHg (10/22 0451) Pulse Rate: 89 (10/22 0451)  Labs:  Recent Labs  05/26/13 0958  05/26/13 1004 05/26/13 1449 05/28/13 0600 05/28/13 0904  HGB  --   < > 14.6 14.7 12.6*  --   HCT  --   --  42.6 43.5 36.5*  --   PLT  --   --  481* 483.0* 371  --   LABPROT  --   --   --  30.7*  --  24.2*  INR  --   --   --  3.0*  --  2.26*  CREATININE 2.48*  --   --  2.5* 1.81*  --   < > = values in this interval not displayed.  Estimated Creatinine Clearance: 46.7 ml/min (by C-G formula based on Cr of 1.81).  Assessment:  56 yom continues on coumadin for history of AT3 deficiency. INR is therapeutic at 2.26, H/H down slightly but no bleeding noted.   Goal of Therapy:  INR 2-3   Plan:  1. Coumadin 5mg  PO x 1 tonight (normal home dose for Wednesday) 2. F/u AM INR  Lysle Pearl, PharmD, BCPS Pager # [phone] 05/28/2013 10:59 AM

## 2013-05-28 NOTE — Care Management Note (Addendum)
    Page 1 of 2   05/29/2013     11:59:54 AM   CARE MANAGEMENT NOTE 05/29/2013  Patient:  Rick Edwards, Rick Edwards   Account Number:  [id number]  Date Initiated:  05/28/2013  Documentation initiated by:  Letha Cape  Subjective/Objective Assessment:   dx antithrombin deficiency, hyperkalemia  admit- lives with father.  active with [facility] for [facility].     Action/Plan:   Anticipated DC Date:  05/29/2013   Anticipated DC Plan:  HOME W HOME HEALTH SERVICES      DC Planning Services  CM consult  Follow-up appt scheduled      [facility] Choice  HOME HEALTH  Resumption Of Svcs/PTA Provider   Choice offered to / List presented to:  C-1 Patient        HH arranged  HH-1 RN      [facility] agency  [facility]   Status of service:  Completed, signed off Medicare Important Message given?   (If response is "NO", the following Medicare IM given date fields will be blank) Date Medicare IM given:   Date Additional Medicare IM given:    Discharge Disposition:  HOME W HOME HEALTH SERVICES  Per UR Regulation:  Reviewed for med. necessity/level of care/duration of stay  If discussed at Long Length of Stay Meetings, dates discussed:    Comments:  05/29/13 11:56 Letha Cape RN,BSN [phone] patient is for dc today, coumadin clinic apt scheduled [facility] pt at [facility] for Tuesday 10/28 at 2:45.  [facility] notified patient is for dc today.  05/28/13 14:48 Letha Cape RN, BSN [phone] patient lives with father, patient chose [facility] from agency list, stating he has worked with them before, he is active with them right now.  Referral made to [facility] for [facility], soc will begin 24-48 hrs post discharge.   Patient will most likely dc tomorrow.  Patient states he has medication coverage with [facility] and that he has transportation at [employer].

## 2013-05-28 NOTE — Progress Notes (Signed)
[facility]  Patient Status: Active (receiving services up to time of hospitalization)  [facility] is providing the following services: RN  If patient discharges after hours, please call [phone].   Kizzie Furnish 05/28/2013, 5:09 PM

## 2013-05-28 NOTE — Progress Notes (Signed)
PATIENT DETAILS Name: Rick Edwards Age: 57 y.o. Sex: male Date of Birth: January 26, 1956 Admit Date: 05/27/2013 Admitting Physician Kathlen Mody, MD UJW:[id number] Yetta Barre, MD  Subjective: No major issues overnight  Assessment/Plan: ARF -suspect this is pre-renal, denies any NSAID intake-suspect patient not keeping up with IVF-to make up for GI loss -gets 4-5 loose stools per day -c/w IVF -get UA -spoke with Dr Tyson Dense role for Questran -need to speak with family-and make sure he has adequate oral intake -will need close outpatient follow up  Hyperkalemia -better -Kayexalate X 1 today -suspect 2/2 ARF  H/o ileorectal anastomosis & pelvic abscess with mid line abd wound -[facility]S following  Hx of Crohn's Disease -stable -c/w Imuran  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 azaTHIOprine  75 mg Oral Daily  . sodium chloride  3 mL Intravenous Q12H  . Warfarin - Pharmacist Dosing Inpatient   Does not apply q1800   Continuous Infusions: . sodium chloride 100 mL/hr at 05/28/13 0216   PRN Meds:.loperamide, ondansetron (ZOFRAN) IV, ondansetron  Antibiotics: Anti-infectives   None       PHYSICAL EXAM: Vital signs in last 24 hours: Filed Vitals:   05/27/13 1330 05/27/13 2043 05/28/13 0451  BP: 122/88 117/82 125/80  Pulse: 89 98 89  Temp: 98.3 F (36.8 C) 98.3 F (36.8 C) 98.2 F (36.8 C)  TempSrc: Oral Oral Oral  Resp: 16 16 16   Height: 5\' 9"  (1.753 m)    Weight: 74.844 kg (165 lb)    SpO2: 97% 97% 99%    Weight change:  Filed Weights   05/27/13 1330  Weight: 74.844 kg (165 lb)   Body mass index is 24.36 kg/(m^2).   Gen Exam: Awake and alert with clear speech.  Hard of hearing Neck:  Supple, No JVD.   Chest: B/L Clear.   CVS: S1 S2 Regular, no murmurs.  Abdomen: soft, BS +, non tender, non distended. Dressing present to mid line incision-dry Extremities: no edema, lower extremities warm to touch. Neurologic: Non Focal.   Skin: No Rash.   Wounds: N/A.   Intake/Output from previous day:  Intake/Output Summary (Last 24 hours) at 05/28/13 0854 Last data filed at 05/28/13 0829  Gross per 24 hour  Intake   2190 ml  Output   1075 ml  Net   1115 ml     LAB RESULTS: CBC  Recent Labs Lab 05/26/13 1004 05/26/13 1449 05/28/13 0600  WBC 5.7 7.1 5.6  HGB 14.6 14.7 12.6*  HCT 42.6 43.5 36.5*  PLT 481* 483.0* 371  MCV 86.8 88.6 86.1  MCH 29.7  --  29.7  MCHC 34.3 33.8 34.5  RDW 15.6* 15.3* 14.7  LYMPHSABS 1.9 2.3  --   MONOABS 0.7 0.8  --   EOSABS 0.2 0.3  --   BASOSABS 0.1 0.1  --     Chemistries   Recent Labs Lab 05/26/13 0958 05/26/13 1449 05/27/13 2228 05/28/13 0600  NA 130* 129*  --  131*  K 6.2* 5.7* 5.2* 5.2*  CL 97 98  --  101  CO2 21 21  --  19  GLUCOSE 97 113*  --  109*  BUN 39* 39*  --  39*  CREATININE 2.48* 2.5*  --  1.81*  CALCIUM 10.7*  9.9  --  8.7    CBG: No results found for this basename: GLUCAP,  in the last 168 hours  GFR Estimated Creatinine Clearance: 46.7 ml/min (by C-G formula based on Cr of 1.81).  Coagulation profile  Recent Labs Lab 05/23/13 0905 05/26/13 1449  INR 1.7 3.0*    Cardiac Enzymes No results found for this basename: CK, CKMB, TROPONINI, MYOGLOBIN,  in the last 168 hours  No components found with this basename: POCBNP,  No results found for this basename: DDIMER,  in the last 72 hours No results found for this basename: HGBA1C,  in the last 72 hours No results found for this basename: CHOL, HDL, LDLCALC, TRIG, CHOLHDL, LDLDIRECT,  in the last 72 hours No results found for this basename: TSH, T4TOTAL, FREET3, T3FREE, THYROIDAB,  in the last 72 hours No results found for this basename:  VITAMINB12, FOLATE, FERRITIN, TIBC, IRON, RETICCTPCT,  in the last 72 hours No results found for this basename: LIPASE, AMYLASE,  in the last 72 hours  Urine Studies No results found for this basename: UACOL, UAPR, USPG, UPH, UTP, UGL, UKET, UBIL, UHGB, UNIT, UROB, ULEU, UEPI, UWBC, URBC, UBAC, CAST, CRYS, UCOM, BILUA,  in the last 72 hours  MICROBIOLOGY: No results found for this or any previous visit (from the past 240 hour(s)).  RADIOLOGY STUDIES/RESULTS: Dg Chest 2 View  04/30/2013   CLINICAL DATA:  57 year old male with postoperative leukocytosis. Status post ileostomy takedown.  EXAM: CHEST  2 VIEW  COMPARISON:  12/29/2012 and earlier.  FINDINGS: Semi upright AP and lateral views. Lower lung volumes. Stable cardiac size and mediastinal contours. Visualized tracheal air column is within normal limits. No pneumothorax or pneumoperitoneum identified. Patchy bibasilar opacity most resembles atelectasis. No pleural effusion or pulmonary edema. No other confluent opacity. Air-fluid level in the stomach with mildly prominent gas-filled bowel loops in the visible upper abdomen.  IMPRESSION: Lower lung volumes with bibasilar atelectasis.   Electronically Signed   By: Augusto Gamble M.D.   On: 04/30/2013 12:55   Ct Pelvis Wo Contrast  05/02/2013   *RADIOLOGY REPORT*  Clinical Data: History of Crohn disease, post colectomy with ileorectal anastomosis and recent ileostomy takedown.  Prolonged recovery with subsequent abdominal CT demonstrating an indeterminate pelvic fluid collection.  Evaluate pelvic fluid collection for potential percutaneous aspiration/drainage.  CT PELVIS WITHOUT CONTRAST  Technique:  Multidetector CT imaging of the pelvis was performed following the standard protocol without intravenous contrast.  Comparison: CT abdomen pelvis - 04/30/2013; pelvic CT - 05/01/2013  Findings:  Noncontrast abdominal CT was performed with the patient positioned left lateral decubitus on the CT gantry.   Noncontrast imaging demonstrates grossly unchanged approximately 3.6 x 6.0 cm fluid collection within the pelvis anterior to the rectum and posterior to the bladder.  Again, there is no definitive extravasation of ingested enteric contrast into this pelvic fluid collection.  As no adequate percutaneous window was seen to this small pelvic fluid collection, no procedure was performed.  Grossly unchanged appearance of partially loculated fluid collection within the left mid hemiabdomen (image 5, series 2), incompletely imaged.  Midline wound vac.  Skin staples within the right lower abdominal quadrant at location of ileostomy takedown.  No acute or aggressive osseous abnormalities.  IMPRESSION: 1.  Unchanged size and appearance of approximately 6.0 x 3.6 cm pelvic fluid collection.  Again, there is no extravasation of ingested enteric contrast into this pelvic fluid collection.  Given lack of adequate percutaneous window, attempted aspiration/drainage of the fluid  collection was not performed. 2.  Grossly unchanged loculated fluid within the left mid hemiabdomen, incompletely imaged.   Original Report Authenticated By: Tacey Ruiz, MD   Ct Pelvis Wo Contrast  05/01/2013   CLINICAL DATA:  Pelvic abscess. FOLLOW UP pelvic fluid collection.  EXAM: CT PELVIS WITHOUT CONTRAST  TECHNIQUE: Multidetector CT imaging of the pelvis was performed following the standard protocol without intravenous contrast.  COMPARISON:  04/30/2013.  FINDINGS: Small bowel remains dilated. Contrast has progressed distally into the rectum. Anastomotic staple line is present in the anatomic pelvis. Immediately inferior to this, there is a oblong fluid collection that today measures 64 mm x 33 mm on axial imaging in the rectovesical pouch. No interval change compared to yesterday's examination. Urinary bladder and prostate gland appear within normal limits with excreted contrast opacifying the bladder. Bony pelvis appears within normal limits. No  free air is identified. Midline abdominal wound is present, likely healing by secondary intention. Right lower quadrant staples are present compatible with ileostomy air colostomy takedown. The left mesenteric fluid collection remains present, without interval change compared to yesterday.  IMPRESSION: Unchanged pelvic fluid collection which may represent hematoma, seroma or abscess. Contrast is present within the rectum and there is no contrast within the fluid collection to suggest leak.   Electronically Signed   By: Andreas Newport M.D.   On: 05/01/2013 13:55   Ct Abdomen Pelvis W Contrast  05/08/2013   CLINICAL DATA:  Follow-up fluid collection, status post ileostomy take-down, history of Crohn's disease  EXAM: CT ABDOMEN AND PELVIS WITH CONTRAST  TECHNIQUE: Multidetector CT imaging of the abdomen and pelvis was performed using the standard protocol following bolus administration of intravenous contrast.  CONTRAST:  OMNIPAQUE IOHEXOL 300 MG/ML  SOLN  COMPARISON:  CT pelvis dated 05/02/2013. CT abdomen pelvis dated 04/30/2013.  FINDINGS: Mild patchy bilateral lower lobe opacities, likely atelectasis.  Liver, spleen, pancreas, and adrenal glands within normal limits.  Gallbladder is unremarkable. No intrahepatic or extrahepatic ductal dilatation.  Bilateral renal cysts and too small to characterize lesions measuring up to 1.4 cm in the left upper pole (series 2/ image 38). No hydronephrosis.  Status post subtotal colectomy with ileorectal anastomosis. Recent prior ileostomy take down. Mildly prominent/dilated loops of small bowel in the right upper abdomen, likely reflecting adynamic ileus. Contrast opacifies distal small bowel proximal to the anastomosis in the right pelvis (series 2/image 74).  Residual 2.1 x 3.7 cm pelvic fluid collection (series 2/image 29), decreased. Tiny foci of extraluminal gas in the pelvis (series 2/image 76), new.  Atherosclerotic calcifications of the abdominal aorta and  branch vessels.  No abdominopelvic ascites. Mild mesenteric stranding in the right lower quadrant (series 2/image 66).  No suspicious abdominopelvic lymphadenopathy.  Prostate is unremarkable.  Bladder is mildly thick-walled although underdistended.  Postsurgical changes in the midline anterior abdominal wall. Mild stranding with minimal ill-defined fluid (series 2/ image 71).  Mild degenerative changes at L5-S1.  IMPRESSION: Status post subtotal colectomy with ileostomy takedown and ileorectal anastomosis.  2.1 x 3.7 cm pelvic fluid collection with, decreased. Associated small foci of extraluminal gas near the anastomosis.   Electronically Signed   By: Charline Bills M.D.   On: 05/08/2013 11:34   Ct Abdomen Pelvis W Contrast  04/30/2013   CLINICAL DATA:  Status post ileostomy take-down. Leukocytosis. Postoperative day 11  EXAM: CT ABDOMEN AND PELVIS WITH CONTRAST  TECHNIQUE: Multidetector CT imaging of the abdomen and pelvis was performed using the standard protocol following  bolus administration of intravenous contrast.  CONTRAST:  OMNIPAQUE IOHEXOL 300 MG/ML  SOLN  COMPARISON:  For 12th 13  FINDINGS: Compressive atelectasis is seen in the posterior lung bases bilaterally.  The liver is enlarged, measuring 20.4 cm in craniocaudal length. Diffuse fatty infiltration of the liver parenchyma is evident. Spleen is normal in appearance. The stomach, duodenum, pancreas, gallbladder, and adrenal glands are unremarkable.  Multiple well-defined low-density lesions of varying sizes are seen in both kidneys measuring from several mm up to a maximum size of 19 mm in the upper pole of the left kidney.  There is no abdominal aortic aneurysm. No free fluid or lymphadenopathy in the abdomen.  Small bowel loops in the abdomen are dilated and fluid-filled, measuring up to 5.3 cm in diameter. There is an area of loculated fluid in the left mesentery measuring 3.6 x 8.5 cm. This does not have a well organized or enhancing  rim to suggest an overt abscess at this time.  The oral contrast material opacifies nondilated duodenum and proximal jejunum. There is a segment of more distal jejunum which tracks down into the a central pelvis, just cranial to the anastomotic suture line. This jejunum shows mild wall thickening and is not dilated. However oral contrast material migrates distal to this area into small bowel loops that become distended, but the contrast material has not yet reached the most distended small bowel loops in the abdomen and pelvis.  There is a tiny amount of gas in the midline incision which is not completely unexpected 11 days after surgery and the patient appears to have a wound manager in place.  A 1.8 x 5.6 cm fluid collection is seen in the right anterolateral abdominal wall, just deep to the staple line, likely represents the site of stoma takedown. This appearance is not unexpected only 11 days from surgery.  Imaging through the pelvis shows no substantial intraperitoneal free fluid. The rectum is fluid-filled. The ileocolic anastomosis is identified and there is a crescent shaped fluid collection tracking from the anastomotic suture line down to the right under the rectum. Since the blind end of the ileum is evident, I do not think that this crescent-shaped, somewhat tubular fluid collection represents bowel. The largest portion of this fluid collection measures approximately 6 x 3.8 cm and is located in the rectovesical pouch. This collection demonstrates an organized rim which appears to show some enhancement. The anastomosis appears to be patent although the rectum just distal to the anastomotic suture line appears slightly narrowed with mild circumferential wall thickening.  Bone windows reveal no worrisome lytic or sclerotic osseous lesions.  IMPRESSION: Dilated central small bowel (likely representing distal jejunum and proximal ileum) measures up to 5 cm in diameter and contains layering fluid with  associated air-fluid levels. No associated small bowel wall thickening is evident. The small bowel proximal and distal to the most dilated segments are nondilated. These imaging features could be related to an inflammatory ileus. Mechanical obstruction cannot be completely excluded although no overt transition zone is identified.  There is a loculated left mesenteric fluid collection which tracks from the region of the anastomosis up in the left mesentery along the peritoneal sidewall.  There is a loculated crescent shaped, tubular fluid collection with an organized rim which tracks from the anastomosis down the right side of the pelvic sidewall and then under the rectum into the region of the rectovesical pouch.  The fluid collection in the rectovesical pouch appears to be the more  organized of the 2 dominant fluid collections (the other one being the left mesenteric/peritoneal cavity collection). Given the apparent organized rim of the pelvic collection, evolving abscess would be a consideration in light of the patient's history of leukocytosis and bowel distention.  I personally discussed these findings by telephone with Dr. Johna Sheriff at approximately 1430 hours on 04/30/2013 .   Electronically Signed   By: Kennith Center M.D.   On: 04/30/2013 14:38   US Renal  05/17/2013   *RADIOLOGY REPORT*  Clinical Data:  Acute renal failure.  RENAL/URINARY TRACT ULTRASOUND COMPLETE  Comparison:  CT of the abdomen and pelvis performed 05/08/2013  Findings:  Right Kidney:  The right kidney measures 11.7 cm in length.  The kidney demonstrates normal size, echogenicity and configuration. No significant cortical thinning is seen.  No hydronephrosis or calcification is identified.  No masses are seen.  Left Kidney:  The left kidney measures 11.9 cm in length.  The kidney demonstrates normal size, echogenicity and configuration. No significant cortical thinning is seen.  No hydronephrosis or calcification is identified.  A small  cyst is noted at the upper pole of the left kidney, measuring 1.5 cm.  Bladder:  The bladder is decompressed and not well assessed.  Diffuse fatty infiltration is noted within the liver.  IMPRESSION:  1.  No evidence of hydronephrosis; renal echogenicity within normal limits. 2.  Small left renal cyst seen. 3.  Diffuse fatty infiltration within the liver.   Original Report Authenticated By: Tonia Ghent, M.D.   Dg Chest Port 1 View  05/16/2013   *RADIOLOGY REPORT*  Clinical Data: Placement of right-sided hemodialysis catheter.  PORTABLE CHEST - 1 VIEW  Comparison: Chest radiograph performed earlier today at 05:26 p.m.  Findings: The patient's right-sided dual lumen catheter is noted ending about the distal SVC.  The lungs are relatively well expanded.  Bibasilar atelectasis or scarring is again noted.  No pleural effusion or pneumothorax is seen.  The cardiomediastinal silhouette is mildly enlarged.  No acute osseous abnormalities are identified.  IMPRESSION:  1.  Right-sided dual lumen catheter noted ending about the distal SVC. 2.  Bibasilar atelectasis or scarring again noted; lungs otherwise clear. 3.  Mild cardiomegaly.   Original Report Authenticated By: Tonia Ghent, M.D.   Dg Abd Acute W/chest  05/16/2013   CLINICAL DATA:  Status post colectomy and ileostomy reversal. Abdominal infection. Dizziness, weakness and nausea. Firm and distended abdomen.  EXAM: ACUTE ABDOMEN SERIES (ABDOMEN 2 VIEW & CHEST 1 VIEW)  COMPARISON:  Multiple priors, most recently CT of the abdomen and pelvis 05/08/2013.  FINDINGS: Lung volumes are low and there are linear opacities in lung bases bilaterally, most compatible with subsegmental atelectasis. No definite consolidative airspace disease. No pleural effusions. No evidence of pulmonary edema. Heart size is normal. Mediastinal contours are unremarkable.  There are numerous dilated loops of small bowel throughout the abdomen, measuring up to approximately 5.4 cm in  diameter. There appears to be some distal rectal gas. A suture line is noted in the central pelvis, likely a side of ileorectal anastomosis. Surgical clips also project over the right side of the abdomen and skin staples project over the low anatomic pelvis. No frank pneumoperitoneum is identified on the left lateral decubitus view. However, there are multiple air-fluid levels within the dilated loops of small bowel.  IMPRESSION: *The appearance of the abdomen is suggestive of a potential small bowel obstruction. However, given the appearance on the prior CT scan and the patient's postoperative  state, the possibility of an ileus warrants consider consideration. Clinical correlation is recommended. *No pneumoperitoneum. *Low lung volumes with bibasilar subsegmental atelectasis.   Electronically Signed   By: Trudie Reed M.D.   On: 05/16/2013 17:49    Jeoffrey Massed, MD  [facility] Pager:[phone]  If 7PM-7AM, please contact night-coverage [email or web address] Password [email or web address] 05/28/2013, 8:54 AM   LOS: 1 day

## 2013-05-29 LAB — BASIC METABOLIC PANEL
GFR calc non Af Amer: 59 mL/min — ABNORMAL LOW (ref >90)
Glucose, Bld: 95 mg/dL (ref 70–99)
Potassium: 4.4 mEq/L (ref 3.5–5.1)
Sodium: 132 mEq/L — ABNORMAL LOW (ref 135–145)

## 2013-05-29 MED ORDER — WARFARIN SODIUM 7.5 MG PO TABS
7.5000 mg | ORAL_TABLET | Freq: Once | ORAL | Status: DC
  Filled 2013-05-29: qty 1

## 2013-05-29 MED ORDER — OXYCODONE-ACETAMINOPHEN 5-325 MG PO TABS: 1.0000 | ORAL_TABLET | Freq: Four times a day (QID) | ORAL | Status: DC | PRN

## 2013-05-29 NOTE — Progress Notes (Signed)
ANTICOAGULATION CONSULT NOTE - Follow-up  Pharmacy Consult for Coumadin Indication: Anti-thrombin def.  Allergies  Allergen Reactions  . Mesalamine     REACTION: Rash    Patient Measurements: Height: 5\' 9"  (175.3 cm) Weight: 165 lb (74.844 kg) IBW/kg (Calculated) : 70.7  Vital Signs: Temp: 98.2 F (36.8 C) (10/23 0454) Temp src: Oral (10/23 0454) BP: 121/71 mmHg (10/23 0454) Pulse Rate: 89 (10/23 0454)  Labs:  Recent Labs  05/26/13 1449 05/28/13 0600 05/28/13 0904 05/29/13 0600 05/29/13 0810  HGB 14.7 12.6*  --   --   --   HCT 43.5 36.5*  --   --   --   PLT 483.0* 371  --   --   --   LABPROT 30.7*  --  24.2* 23.5*  --   INR 3.0*  --  2.26* 2.17*  --   CREATININE 2.5* 1.81*  --   --  1.33    Estimated Creatinine Clearance: 63.5 ml/min (by C-G formula based on Cr of 1.33).  Assessment:  56 yom continues on coumadin for history of AT3 deficiency. INR is therapeutic at 2.17, H/H down slightly but no bleeding noted.   Goal of Therapy:  INR 2-3   Plan:  1. Coumadin 7.5 mg PO x 1 tonight (normal home dose for Thursday) 2. F/u AM INR  Vinnie Level, PharmD.  [place] License #[id number] Application for [place] reciprocity pending  Clinical Pharmacist Pager [phone]

## 2013-05-29 NOTE — Progress Notes (Signed)
Rick Edwards to be D/C'd Home per MD order.  Discussed with the patient and all questions fully answered.    Medication List    STOP taking these medications       potassium chloride SA 20 MEQ tablet  Commonly known as:  K-DUR,KLOR-CON      TAKE these medications       azaTHIOprine 50 MG tablet  Commonly known as:  IMURAN  Take 75 mg by mouth daily.     loperamide 2 MG capsule  Commonly known as:  IMODIUM  Take 1 capsule (2 mg total) by mouth as needed for diarrhea or loose stools.     oxyCODONE-acetaminophen 5-325 MG per tablet  Commonly known as:  PERCOCET/ROXICET  Take 1-2 tablets by mouth every 6 (six) hours as needed.     warfarin 5 MG tablet  Commonly known as:  COUMADIN  Take 5 mg by mouth daily. Take 1 tablet (5mg ) every Wed and Saturday and 1.5 tablets (7.5mg ) all other days of the week.        VVS, Skin clean, dry and intact without evidence of skin break down, no evidence of skin tears noted. IV catheter discontinued intact. Site without signs and symptoms of complications. Dressing and pressure applied.  An After Visit Summary was printed and given to the patient. Follow up appointments , new prescriptions and medication administration times given. Information on dehydration  Given as handouts to pt. Patient escorted via WC, and D/C home via private auto.  Cindra Eves, RN 05/29/2013 3:58 PM

## 2013-05-29 NOTE — Discharge Summary (Addendum)
PATIENT DETAILS Name: Rick Edwards Age: 57 y.o. Sex: male Date of Birth: 1956-05-22 MRN: [id number]. Admit Date: 05/27/2013 Admitting Physician: Kathlen Mody, MD [id number] Yetta Barre, MD  Recommendations for Outpatient Follow-up:  1. Please encourage fluid intake, monitor chemistries very closely as an outpatient.  PRIMARY DISCHARGE DIAGNOSIS:  Active Problems:   HYPERTENSION   CROHN'S DISEASE-LARGE INTESTINE   Antithrombin III deficiency   Kidney disease, chronic, stage III (GFR 30-59 ml/min)   Hyperkalemia      PAST MEDICAL HISTORY: Past Medical History  Diagnosis Date  . Crohn's disease     tx. Imuran  . Perianal abscess 2001    s/p right hemicolectomy, and drainage of retroperitoneal abcess 2003  . Hypertension   . Hearing loss     wears hearing aid left ear; birthed with hearing loss  . GI bleed 6/11    w/ normal EGD 6/11, 3 PRBCs   . Anemia     anemia thrombocytopenia, saw Hematology 2011, can not r/o myeloproliferative d/c  . Transfusion history     last 8'13  . Ileostomy in place 04-11-13    04-18-13 ileostomy to be taken down.  Marland Kitchen HOH (hard of hearing) 05/16/2013  . Antithrombin III deficiency 05/16/2013    On coumadin for this  . Chronic kidney disease 05/27/2013    ACUTE RENAL FAILURE   . Hypercalcemia   . Hyperkalemia 05/27/2013    DISCHARGE MEDICATIONS:   Medication List    STOP taking these medications       potassium chloride SA 20 MEQ tablet  Commonly known as:  K-DUR,KLOR-CON      TAKE these medications       azaTHIOprine 50 MG tablet  Commonly known as:  IMURAN  Take 75 mg by mouth daily.     loperamide 2 MG capsule  Commonly known as:  IMODIUM  Take 1 capsule (2 mg total) by mouth as needed for diarrhea or loose stools.     oxyCODONE-acetaminophen 5-325 MG per tablet  Commonly known as:  PERCOCET/ROXICET  Take 1-2 tablets by mouth every 6 (six) hours as needed.     warfarin 5 MG tablet  Commonly known as:  COUMADIN  Take 5 mg by  mouth daily. Take 1 tablet (5mg ) every Wed and Saturday and 1.5 tablets (7.5mg ) all other days of the week.        ALLERGIES:   Allergies  Allergen Reactions  . Mesalamine     REACTION: Rash    BRIEF HPI:  See H&P, Labs, Consult and Test reports for all details in brief, patient is a 57 year old male with recent history of colectomy and ileorectal anastomosis was discharged from the hospital, admitted for acute renal failure and mild hypokalemia.  CONSULTATIONS:   general surgery  PERTINENT RADIOLOGIC STUDIES: Dg Chest 2 View  04/30/2013   CLINICAL DATA:  57 year old male with postoperative leukocytosis. Status post ileostomy takedown.  EXAM: CHEST  2 VIEW  COMPARISON:  12/29/2012 and earlier.  FINDINGS: Semi upright AP and lateral views. Lower lung volumes. Stable cardiac size and mediastinal contours. Visualized tracheal air column is within normal limits. No pneumothorax or pneumoperitoneum identified. Patchy bibasilar opacity most resembles atelectasis. No pleural effusion or pulmonary edema. No other confluent opacity. Air-fluid level in the stomach with mildly prominent gas-filled bowel loops in the visible upper abdomen.  IMPRESSION: Lower lung volumes with bibasilar atelectasis.   Electronically Signed   By: Augusto Gamble M.D.   On: 04/30/2013 12:55   Ct Pelvis  Wo Contrast  05/02/2013   *RADIOLOGY REPORT*  Clinical Data: History of Crohn disease, post colectomy with ileorectal anastomosis and recent ileostomy takedown.  Prolonged recovery with subsequent abdominal CT demonstrating an indeterminate pelvic fluid collection.  Evaluate pelvic fluid collection for potential percutaneous aspiration/drainage.  CT PELVIS WITHOUT CONTRAST  Technique:  Multidetector CT imaging of the pelvis was performed following the standard protocol without intravenous contrast.  Comparison: CT abdomen pelvis - 04/30/2013; pelvic CT - 05/01/2013  Findings:  Noncontrast abdominal CT was performed with the patient  positioned left lateral decubitus on the CT gantry.  Noncontrast imaging demonstrates grossly unchanged approximately 3.6 x 6.0 cm fluid collection within the pelvis anterior to the rectum and posterior to the bladder.  Again, there is no definitive extravasation of ingested enteric contrast into this pelvic fluid collection.  As no adequate percutaneous window was seen to this small pelvic fluid collection, no procedure was performed.  Grossly unchanged appearance of partially loculated fluid collection within the left mid hemiabdomen (image 5, series 2), incompletely imaged.  Midline wound vac.  Skin staples within the right lower abdominal quadrant at location of ileostomy takedown.  No acute or aggressive osseous abnormalities.  IMPRESSION: 1.  Unchanged size and appearance of approximately 6.0 x 3.6 cm pelvic fluid collection.  Again, there is no extravasation of ingested enteric contrast into this pelvic fluid collection.  Given lack of adequate percutaneous window, attempted aspiration/drainage of the fluid collection was not performed. 2.  Grossly unchanged loculated fluid within the left mid hemiabdomen, incompletely imaged.   Original Report Authenticated By: Tacey Ruiz, MD   Ct Pelvis Wo Contrast  05/01/2013   CLINICAL DATA:  Pelvic abscess. FOLLOW UP pelvic fluid collection.  EXAM: CT PELVIS WITHOUT CONTRAST  TECHNIQUE: Multidetector CT imaging of the pelvis was performed following the standard protocol without intravenous contrast.  COMPARISON:  04/30/2013.  FINDINGS: Small bowel remains dilated. Contrast has progressed distally into the rectum. Anastomotic staple line is present in the anatomic pelvis. Immediately inferior to this, there is a oblong fluid collection that today measures 64 mm x 33 mm on axial imaging in the rectovesical pouch. No interval change compared to yesterday's examination. Urinary bladder and prostate gland appear within normal limits with excreted contrast opacifying the  bladder. Bony pelvis appears within normal limits. No free air is identified. Midline abdominal wound is present, likely healing by secondary intention. Right lower quadrant staples are present compatible with ileostomy air colostomy takedown. The left mesenteric fluid collection remains present, without interval change compared to yesterday.  IMPRESSION: Unchanged pelvic fluid collection which may represent hematoma, seroma or abscess. Contrast is present within the rectum and there is no contrast within the fluid collection to suggest leak.   Electronically Signed   By: Andreas Newport M.D.   On: 05/01/2013 13:55   Ct Abdomen Pelvis W Contrast  05/08/2013   CLINICAL DATA:  Follow-up fluid collection, status post ileostomy take-down, history of Crohn's disease  EXAM: CT ABDOMEN AND PELVIS WITH CONTRAST  TECHNIQUE: Multidetector CT imaging of the abdomen and pelvis was performed using the standard protocol following bolus administration of intravenous contrast.  CONTRAST:  OMNIPAQUE IOHEXOL 300 MG/ML  SOLN  COMPARISON:  CT pelvis dated 05/02/2013. CT abdomen pelvis dated 04/30/2013.  FINDINGS: Mild patchy bilateral lower lobe opacities, likely atelectasis.  Liver, spleen, pancreas, and adrenal glands within normal limits.  Gallbladder is unremarkable. No intrahepatic or extrahepatic ductal dilatation.  Bilateral renal cysts and too small to  characterize lesions measuring up to 1.4 cm in the left upper pole (series 2/ image 38). No hydronephrosis.  Status post subtotal colectomy with ileorectal anastomosis. Recent prior ileostomy take down. Mildly prominent/dilated loops of small bowel in the right upper abdomen, likely reflecting adynamic ileus. Contrast opacifies distal small bowel proximal to the anastomosis in the right pelvis (series 2/image 74).  Residual 2.1 x 3.7 cm pelvic fluid collection (series 2/image 29), decreased. Tiny foci of extraluminal gas in the pelvis (series 2/image 76), new.   Atherosclerotic calcifications of the abdominal aorta and branch vessels.  No abdominopelvic ascites. Mild mesenteric stranding in the right lower quadrant (series 2/image 66).  No suspicious abdominopelvic lymphadenopathy.  Prostate is unremarkable.  Bladder is mildly thick-walled although underdistended.  Postsurgical changes in the midline anterior abdominal wall. Mild stranding with minimal ill-defined fluid (series 2/ image 71).  Mild degenerative changes at L5-S1.  IMPRESSION: Status post subtotal colectomy with ileostomy takedown and ileorectal anastomosis.  2.1 x 3.7 cm pelvic fluid collection with, decreased. Associated small foci of extraluminal gas near the anastomosis.   Electronically Signed   By: Charline Bills M.D.   On: 05/08/2013 11:34   Ct Abdomen Pelvis W Contrast  04/30/2013   CLINICAL DATA:  Status post ileostomy take-down. Leukocytosis. Postoperative day 11  EXAM: CT ABDOMEN AND PELVIS WITH CONTRAST  TECHNIQUE: Multidetector CT imaging of the abdomen and pelvis was performed using the standard protocol following bolus administration of intravenous contrast.  CONTRAST:  OMNIPAQUE IOHEXOL 300 MG/ML  SOLN  COMPARISON:  For 12th 13  FINDINGS: Compressive atelectasis is seen in the posterior lung bases bilaterally.  The liver is enlarged, measuring 20.4 cm in craniocaudal length. Diffuse fatty infiltration of the liver parenchyma is evident. Spleen is normal in appearance. The stomach, duodenum, pancreas, gallbladder, and adrenal glands are unremarkable.  Multiple well-defined low-density lesions of varying sizes are seen in both kidneys measuring from several mm up to a maximum size of 19 mm in the upper pole of the left kidney.  There is no abdominal aortic aneurysm. No free fluid or lymphadenopathy in the abdomen.  Small bowel loops in the abdomen are dilated and fluid-filled, measuring up to 5.3 cm in diameter. There is an area of loculated fluid in the left mesentery measuring 3.6 x  8.5 cm. This does not have a well organized or enhancing rim to suggest an overt abscess at this time.  The oral contrast material opacifies nondilated duodenum and proximal jejunum. There is a segment of more distal jejunum which tracks down into the a central pelvis, just cranial to the anastomotic suture line. This jejunum shows mild wall thickening and is not dilated. However oral contrast material migrates distal to this area into small bowel loops that become distended, but the contrast material has not yet reached the most distended small bowel loops in the abdomen and pelvis.  There is a tiny amount of gas in the midline incision which is not completely unexpected 11 days after surgery and the patient appears to have a wound manager in place.  A 1.8 x 5.6 cm fluid collection is seen in the right anterolateral abdominal wall, just deep to the staple line, likely represents the site of stoma takedown. This appearance is not unexpected only 11 days from surgery.  Imaging through the pelvis shows no substantial intraperitoneal free fluid. The rectum is fluid-filled. The ileocolic anastomosis is identified and there is a crescent shaped fluid collection tracking from the anastomotic suture  line down to the right under the rectum. Since the blind end of the ileum is evident, I do not think that this crescent-shaped, somewhat tubular fluid collection represents bowel. The largest portion of this fluid collection measures approximately 6 x 3.8 cm and is located in the rectovesical pouch. This collection demonstrates an organized rim which appears to show some enhancement. The anastomosis appears to be patent although the rectum just distal to the anastomotic suture line appears slightly narrowed with mild circumferential wall thickening.  Bone windows reveal no worrisome lytic or sclerotic osseous lesions.  IMPRESSION: Dilated central small bowel (likely representing distal jejunum and proximal ileum) measures up to  5 cm in diameter and contains layering fluid with associated air-fluid levels. No associated small bowel wall thickening is evident. The small bowel proximal and distal to the most dilated segments are nondilated. These imaging features could be related to an inflammatory ileus. Mechanical obstruction cannot be completely excluded although no overt transition zone is identified.  There is a loculated left mesenteric fluid collection which tracks from the region of the anastomosis up in the left mesentery along the peritoneal sidewall.  There is a loculated crescent shaped, tubular fluid collection with an organized rim which tracks from the anastomosis down the right side of the pelvic sidewall and then under the rectum into the region of the rectovesical pouch.  The fluid collection in the rectovesical pouch appears to be the more organized of the 2 dominant fluid collections (the other one being the left mesenteric/peritoneal cavity collection). Given the apparent organized rim of the pelvic collection, evolving abscess would be a consideration in light of the patient's history of leukocytosis and bowel distention.  I personally discussed these findings by telephone with Dr. Johna Sheriff at approximately 1430 hours on 04/30/2013 .   Electronically Signed   By: Kennith Center M.D.   On: 04/30/2013 14:38   US Renal  05/17/2013   *RADIOLOGY REPORT*  Clinical Data:  Acute renal failure.  RENAL/URINARY TRACT ULTRASOUND COMPLETE  Comparison:  CT of the abdomen and pelvis performed 05/08/2013  Findings:  Right Kidney:  The right kidney measures 11.7 cm in length.  The kidney demonstrates normal size, echogenicity and configuration. No significant cortical thinning is seen.  No hydronephrosis or calcification is identified.  No masses are seen.  Left Kidney:  The left kidney measures 11.9 cm in length.  The kidney demonstrates normal size, echogenicity and configuration. No significant cortical thinning is seen.  No  hydronephrosis or calcification is identified.  A small cyst is noted at the upper pole of the left kidney, measuring 1.5 cm.  Bladder:  The bladder is decompressed and not well assessed.  Diffuse fatty infiltration is noted within the liver.  IMPRESSION:  1.  No evidence of hydronephrosis; renal echogenicity within normal limits. 2.  Small left renal cyst seen. 3.  Diffuse fatty infiltration within the liver.   Original Report Authenticated By: Tonia Ghent, M.D.   Dg Chest Port 1 View  05/16/2013   *RADIOLOGY REPORT*  Clinical Data: Placement of right-sided hemodialysis catheter.  PORTABLE CHEST - 1 VIEW  Comparison: Chest radiograph performed earlier today at 05:26 p.m.  Findings: The patient's right-sided dual lumen catheter is noted ending about the distal SVC.  The lungs are relatively well expanded.  Bibasilar atelectasis or scarring is again noted.  No pleural effusion or pneumothorax is seen.  The cardiomediastinal silhouette is mildly enlarged.  No acute osseous abnormalities are identified.  IMPRESSION:  1.  Right-sided dual lumen catheter noted ending about the distal SVC. 2.  Bibasilar atelectasis or scarring again noted; lungs otherwise clear. 3.  Mild cardiomegaly.   Original Report Authenticated By: Tonia Ghent, M.D.   Dg Abd Acute W/chest  05/16/2013   CLINICAL DATA:  Status post colectomy and ileostomy reversal. Abdominal infection. Dizziness, weakness and nausea. Firm and distended abdomen.  EXAM: ACUTE ABDOMEN SERIES (ABDOMEN 2 VIEW & CHEST 1 VIEW)  COMPARISON:  Multiple priors, most recently CT of the abdomen and pelvis 05/08/2013.  FINDINGS: Lung volumes are low and there are linear opacities in lung bases bilaterally, most compatible with subsegmental atelectasis. No definite consolidative airspace disease. No pleural effusions. No evidence of pulmonary edema. Heart size is normal. Mediastinal contours are unremarkable.  There are numerous dilated loops of small bowel throughout the  abdomen, measuring up to approximately 5.4 cm in diameter. There appears to be some distal rectal gas. A suture line is noted in the central pelvis, likely a side of ileorectal anastomosis. Surgical clips also project over the right side of the abdomen and skin staples project over the low anatomic pelvis. No frank pneumoperitoneum is identified on the left lateral decubitus view. However, there are multiple air-fluid levels within the dilated loops of small bowel.  IMPRESSION: *The appearance of the abdomen is suggestive of a potential small bowel obstruction. However, given the appearance on the prior CT scan and the patient's postoperative state, the possibility of an ileus warrants consider consideration. Clinical correlation is recommended. *No pneumoperitoneum. *Low lung volumes with bibasilar subsegmental atelectasis.   Electronically Signed   By: Trudie Reed M.D.   On: 05/16/2013 17:49     PERTINENT LAB RESULTS: CBC:  Recent Labs  05/26/13 1449 05/28/13 0600  WBC 7.1 5.6  HGB 14.7 12.6*  HCT 43.5 36.5*  PLT 483.0* 371   CMET CMP     Component Value Date/Time   NA 132* 05/29/2013 0810   K 4.4 05/29/2013 0810   CL 100 05/29/2013 0810   CO2 19 05/29/2013 0810   GLUCOSE 95 05/29/2013 0810   GLUCOSE 101 02/26/2008 1535   BUN 24* 05/29/2013 0810   CREATININE 1.33 05/29/2013 0810   CREATININE 2.48* 05/26/2013 0958   CALCIUM 8.2* 05/29/2013 0810   PROT 6.6 05/18/2013 0720   ALBUMIN 3.1* 05/20/2013 0345   AST 20 05/18/2013 0720   ALT 47 05/18/2013 0720   ALKPHOS 111 05/18/2013 0720   BILITOT 0.7 05/18/2013 0720   GFRNONAA 59* 05/29/2013 0810   GFRAA 69* 05/29/2013 0810    GFR Estimated Creatinine Clearance: 63.5 ml/min (by C-G formula based on Cr of 1.33). No results found for this basename: LIPASE, AMYLASE,  in the last 72 hours No results found for this basename: CKTOTAL, CKMB, CKMBINDEX, TROPONINI,  in the last 72 hours No components found with this basename: POCBNP,   No results found for this basename: DDIMER,  in the last 72 hours No results found for this basename: HGBA1C,  in the last 72 hours No results found for this basename: CHOL, HDL, LDLCALC, TRIG, CHOLHDL, LDLDIRECT,  in the last 72 hours No results found for this basename: TSH, T4TOTAL, FREET3, T3FREE, THYROIDAB,  in the last 72 hours No results found for this basename: VITAMINB12, FOLATE, FERRITIN, TIBC, IRON, RETICCTPCT,  in the last 72 hours Coags:  Recent Labs  05/28/13 0904 05/29/13 0600  INR 2.26* 2.17*   Microbiology: No results found for this or any previous visit (from the past  240 hour(s)).   BRIEF HOSPITAL COURSE:  ARF  -suspect this is pre-renal, denies any NSAID intake-suspect patient not keeping up with IVF-to make up for GI loss, -gets 4-5 loose stools per day  - He was admitted and given IV fluids, with IV fluids his creatinine has normalized. UA did not show any proteinuria. -spoke with Dr Tyson Dense role for [facility]  - Subsequently had a family discussion with the patient and his parents at bedside, explained the rationale for generous oral intake and need to keep up with his fluids. I will also ask case management to have a home health RN arranged. -I've asked the family to make sure that he gets periodic followup with his primary care practitioner and at the Coumadin clinic  Hyperkalemia  - Suspect this was secondary to acute renal failure and him being on KCl supplementation - This is now resolved. He did require Kayexalate this admission.  H/o ileorectal anastomosis & pelvic abscess with mid line abd wound  -[facility] was consulted during this hospital stay the  - Patient has been asked to keep his regular appointment with his primary surgeon.  Hx of Crohn's Disease  -stable  -c/w Imuran  - Patient has been asked to keep his followup appointment with [facility] gastroenterology  anti thrombin deficiency  -chronic anticoagulation with coumadin- this was managed per  pharmacy while he was inpatient -. followup in the Coumadin clinic  TODAY-DAY OF DISCHARGE:  Subjective:   Rick Edwards today has no headache,no chest abdominal pain,no new weakness tingling or numbness, feels much better wants to go home today.   Objective:   Blood pressure 121/71, pulse 89, temperature 98.2 F (36.8 C), temperature source Oral, resp. rate 18, height 5\' 9"  (1.753 m), weight 74.844 kg (165 lb), SpO2 96.00%.  Intake/Output Summary (Last 24 hours) at 05/29/13 1128 Last data filed at 05/29/13 0615  Gross per 24 hour  Intake 2777.33 ml  Output      0 ml  Net 2777.33 ml   Filed Weights   05/27/13 1330  Weight: 74.844 kg (165 lb)    Exam Awake Alert, Oriented *3, No new F.N deficits, Normal affect [place].AT,PERRAL Supple Neck,No JVD, No cervical lymphadenopathy appriciated.  Symmetrical Chest wall movement, Good air movement bilaterally, CTAB RRR,No Gallops,Rubs or new Murmurs, No Parasternal Heave +ve B.Sounds, Abd Soft, Non tender, No organomegaly appriciated, No rebound -guarding or rigidity. No Cyanosis, Clubbing or edema, No new Rash or bruise  DISCHARGE CONDITION: Stable  DISPOSITION: Home  DISCHARGE INSTRUCTIONS:    Activity:  As tolerated   Diet recommendation: Heart Healthy diet   Discharge Orders   Future Appointments Provider Department Dept Phone   06/03/2013 2:45 PM [facility] -[place] [phone]   06/04/2013 2:45 PM Mariella Saa, MD [facility] [facility] [place] [phone]   06/20/2013 9:00 AM [facility] -[place] [phone]   07/23/2013 9:00 AM [facility] [phone]   07/24/2013 9:00 AM Si Gaul, MD [facility] MEDICAL ONCOLOGY [phone]   09/24/2013 9:00 AM Chuck Hint, MD [facility] [phone]   Future Orders Complete By  Expires   Call MD for:  persistant nausea and vomiting  As directed    Diet - low sodium heart healthy  As directed    Increase activity slowly  As directed       Follow-up Information   Follow up with Maisie Fus  Yetta Barre, MD In 1 week. (appointment at 2:45 pm)    Specialty:  Internal Medicine   Contact information:   [address] [place] [address] [phone]       Follow up with Mariella Saa, MD. Schedule an appointment as soon as possible for a visit on 06/04/2013.   Specialty:  General Surgery   Contact information:   [address] [place] [address] [phone]       Follow up with [facility] On 06/03/2013. (2:45)      Total Time spent on discharge equals 45 minutes.  SignedJeoffrey Massed 05/29/2013 11:28 AM

## 2013-06-02 ENCOUNTER — Telehealth (INDEPENDENT_AMBULATORY_CARE_PROVIDER_SITE_OTHER): Payer: Self-pay | Admitting: *Deleted

## 2013-06-02 NOTE — Telephone Encounter (Signed)
Judeth Cornfield with [facility] called to update that patient's father has been shown and is confident in dressing changes at this time.  She states that patient's insurance will only cover 2 more visits so they want to hold off on using those incase they need them later this year.  She just wanted to make sure Dr. Johna Sheriff was aware that they are discharging patient at this time from their service.

## 2013-06-03 ENCOUNTER — Ambulatory Visit (INDEPENDENT_AMBULATORY_CARE_PROVIDER_SITE_OTHER): Payer: Medicaid Other | Admitting: General Practice

## 2013-06-03 DIAGNOSIS — Z7901 Long term (current) use of anticoagulants: Secondary | ICD-10-CM

## 2013-06-03 DIAGNOSIS — I743 Embolism and thrombosis of arteries of the lower extremities: Secondary | ICD-10-CM

## 2013-06-04 ENCOUNTER — Encounter (INDEPENDENT_AMBULATORY_CARE_PROVIDER_SITE_OTHER): Payer: No Typology Code available for payment source | Admitting: General Surgery

## 2013-06-06 ENCOUNTER — Ambulatory Visit: Payer: No Typology Code available for payment source | Admitting: Internal Medicine

## 2013-06-11 ENCOUNTER — Encounter: Payer: Self-pay | Admitting: Internal Medicine

## 2013-06-11 ENCOUNTER — Ambulatory Visit (INDEPENDENT_AMBULATORY_CARE_PROVIDER_SITE_OTHER): Payer: Self-pay | Admitting: Internal Medicine

## 2013-06-11 VITALS — BP 148/108 | HR 92 | Temp 98.7°F | Resp 16 | Ht 69.0 in | Wt 191.0 lb

## 2013-06-11 DIAGNOSIS — F528 Other sexual dysfunction not due to a substance or known physiological condition: Secondary | ICD-10-CM

## 2013-06-11 DIAGNOSIS — N529 Male erectile dysfunction, unspecified: Secondary | ICD-10-CM

## 2013-06-11 DIAGNOSIS — I10 Essential (primary) hypertension: Secondary | ICD-10-CM

## 2013-06-11 MED ORDER — TADALAFIL 5 MG PO TABS: 5.0000 mg | ORAL_TABLET | Freq: Every day | ORAL | Status: DC | PRN

## 2013-06-11 MED ORDER — PROPRANOLOL HCL ER BEADS 120 MG PO CP24: 120.0000 mg | ORAL_CAPSULE | Freq: Every day | ORAL | Status: DC

## 2013-06-11 NOTE — Progress Notes (Signed)
Pre-visit discussion using our clinic review tool. No additional management support is needed unless otherwise documented below in the visit note.  

## 2013-06-11 NOTE — Patient Instructions (Signed)

## 2013-06-11 NOTE — Progress Notes (Signed)
  Subjective:    Patient ID: Rick Edwards, male    DOB: May 07, 1956, 57 y.o.   MRN: [id number]  Hypertension This is a chronic problem. The current episode started more than 1 year ago. The problem has been gradually worsening since onset. The problem is uncontrolled. Pertinent negatives include no anxiety, blurred vision, chest pain, headaches, malaise/fatigue, neck pain, orthopnea, palpitations, peripheral edema, PND, shortness of breath or sweats. Compliance problems include exercise and diet.       Review of Systems  Constitutional: Negative.  Negative for fever, chills, malaise/fatigue, diaphoresis, appetite change and fatigue.  HENT: Negative.   Eyes: Negative.  Negative for blurred vision.  Respiratory: Negative.  Negative for cough, choking, chest tightness, shortness of breath, wheezing and stridor.   Cardiovascular: Negative.  Negative for chest pain, palpitations, orthopnea, leg swelling and PND.  Gastrointestinal: Negative.  Negative for nausea, vomiting, abdominal pain, diarrhea, constipation and blood in stool.  Endocrine: Negative for cold intolerance.  Genitourinary: Negative.   Musculoskeletal: Negative.  Negative for neck pain.  Skin: Negative.   Allergic/Immunologic: Negative.   Neurological: Negative.  Negative for dizziness, tremors, facial asymmetry, weakness and headaches.  Hematological: Negative.  Negative for adenopathy. Does not bruise/bleed easily.  Psychiatric/Behavioral: Negative.        Objective:   Physical Exam  Vitals reviewed. Constitutional: He is oriented to person, place, and time. He appears well-developed and well-nourished. No distress.  HENT:  Head: Normocephalic and atraumatic.  Mouth/Throat: Oropharynx is clear and moist. No oropharyngeal exudate.  Eyes: Conjunctivae are normal. Right eye exhibits no discharge. Left eye exhibits no discharge. No scleral icterus.  Neck: Normal range of motion. Neck supple. No JVD present. No tracheal  deviation present. No thyromegaly present.  Cardiovascular: Normal rate, regular rhythm, normal heart sounds and intact distal pulses.  Exam reveals no gallop and no friction rub.   No murmur heard. Pulmonary/Chest: Effort normal and breath sounds normal. No stridor. No respiratory distress. He has no wheezes. He has no rales. He exhibits no tenderness.  Abdominal: Soft. Bowel sounds are normal. He exhibits no distension and no mass. There is no tenderness. There is no rebound and no guarding.  Musculoskeletal: Normal range of motion. He exhibits no edema and no tenderness.  Lymphadenopathy:    He has no cervical adenopathy.  Neurological: He is oriented to person, place, and time.  Skin: Skin is warm and dry. No rash noted. He is not diaphoretic. No erythema. No pallor.  Psychiatric: He has a normal mood and affect. His behavior is normal. Judgment and thought content normal.     Lab Results  Component Value Date   WBC 5.6 05/28/2013   HGB 12.6* 05/28/2013   HCT 36.5* 05/28/2013   PLT 371 05/28/2013   GLUCOSE 95 05/29/2013   CHOL 129 03/18/2012   TRIG 186* 03/18/2012   HDL 42.90 09/08/2011   LDLCALC 137* 09/08/2011   ALT 47 05/18/2013   AST 20 05/18/2013   NA 132* 05/29/2013   K 4.4 05/29/2013   CL 100 05/29/2013   CREATININE 1.33 05/29/2013   BUN 24* 05/29/2013   CO2 19 05/29/2013   TSH 1.31 09/08/2011   PSA 3.86 08/15/2012   INR 2.1 06/03/2013       Assessment & Plan:

## 2013-06-12 ENCOUNTER — Encounter: Payer: Self-pay | Admitting: Internal Medicine

## 2013-06-12 NOTE — Assessment & Plan Note (Signed)
He will try cialis for this 

## 2013-06-12 NOTE — Assessment & Plan Note (Signed)
His BP is not well controlled He has recently had some dehydration and renal insufficiency so I think a beta-blocker is a good choice for him

## 2013-06-17 ENCOUNTER — Telehealth (INDEPENDENT_AMBULATORY_CARE_PROVIDER_SITE_OTHER): Payer: Self-pay | Admitting: General Surgery

## 2013-06-17 NOTE — Telephone Encounter (Signed)
Tiffany with [facility] called - she has not received paperwork back on patient that she faxed on 06/05/2013. I advised Dr Johna Sheriff has not been in the office since then- I advised he will be here Thursday 06/19/2013 and to call if she does not receive it then.

## 2013-06-30 NOTE — Telephone Encounter (Signed)
Rick Edwards called back to report she hasn't rec'd the plan of care forms that she had faxed.  Dr Johna Sheriff is the patient's dr.  [name] can call her at [phone] ext [phone].  She will refax them today to [phone].

## 2013-07-01 ENCOUNTER — Ambulatory Visit (INDEPENDENT_AMBULATORY_CARE_PROVIDER_SITE_OTHER): Payer: Self-pay | Admitting: General Practice

## 2013-07-01 DIAGNOSIS — Z7901 Long term (current) use of anticoagulants: Secondary | ICD-10-CM

## 2013-07-01 DIAGNOSIS — I743 Embolism and thrombosis of arteries of the lower extremities: Secondary | ICD-10-CM

## 2013-07-01 LAB — POCT INR: INR: 2.5

## 2013-07-01 NOTE — Progress Notes (Signed)
Pre-visit discussion using our clinic review tool. No additional management support is needed unless otherwise documented below in the visit note.  

## 2013-07-08 ENCOUNTER — Ambulatory Visit (INDEPENDENT_AMBULATORY_CARE_PROVIDER_SITE_OTHER): Payer: Self-pay | Admitting: General Surgery

## 2013-07-08 ENCOUNTER — Other Ambulatory Visit: Payer: Self-pay | Admitting: Internal Medicine

## 2013-07-08 VITALS — BP 158/94 | HR 92 | Temp 98.9°F | Resp 18 | Ht 69.0 in | Wt 197.0 lb

## 2013-07-08 DIAGNOSIS — T8189XA Other complications of procedures, not elsewhere classified, initial encounter: Secondary | ICD-10-CM

## 2013-07-08 DIAGNOSIS — Z189 Retained foreign body fragments, unspecified material: Secondary | ICD-10-CM

## 2013-07-08 NOTE — Progress Notes (Signed)
Subjective:     Patient ID: Rick Edwards, male   DOB: 01-18-1956, 57 y.o.   MRN: [id number]  HPI This patient comes in today for evaluation of some exposed suture material at his midline incision. He underwent ileostomy takedown in September by Dr. Johna Sheriff. He said he noticed several weeks ago that the suture material was sticking out.  Review of Systems     Objective:   Physical Exam There is a single exposed Novafil suture at the upper portion of his midline incision. There is no drainage or erythema or tenderness. I anesthetized the area with approximately 1 cc of 1% lidocaine with epinephrine after prepping the area with chlorhexidine solution. This allowed me to expose the base of the suture and this was easily cut and removed. Dressing was applied    Assessment:     Exposed suture The suture was removed without difficulty. It appeared to be one of the interrupted Novafil sutures that was placed during his midline closure. This was tolerated without difficulty and the wound is hemostatic. Dressing was applied     Plan:     Apply clean and dry gauze as needed Followup with Dr. Johna Sheriff at his regularly scheduled appointment or sooner if needed

## 2013-07-23 ENCOUNTER — Other Ambulatory Visit (HOSPITAL_BASED_OUTPATIENT_CLINIC_OR_DEPARTMENT_OTHER): Payer: Medicaid Other

## 2013-07-23 DIAGNOSIS — D649 Anemia, unspecified: Secondary | ICD-10-CM

## 2013-07-23 DIAGNOSIS — D509 Iron deficiency anemia, unspecified: Secondary | ICD-10-CM

## 2013-07-23 LAB — IRON AND TIBC CHCC
Iron: 57 ug/dL (ref 42–163)
TIBC: 295 ug/dL (ref 202–409)
UIBC: 238 ug/dL (ref 117–376)

## 2013-07-23 LAB — CBC WITH DIFFERENTIAL/PLATELET
BASO%: 1.3 % (ref 0.0–2.0)
HCT: 40.7 % (ref 38.4–49.9)
MCH: 30.9 pg (ref 27.2–33.4)
MCHC: 34.4 g/dL (ref 32.0–36.0)
MCV: 89.7 fL (ref 79.3–98.0)
MONO#: 0.6 10*3/uL (ref 0.1–0.9)
MONO%: 11.5 % (ref 0.0–14.0)
NEUT%: 59.2 % (ref 39.0–75.0)
RBC: 4.53 10*6/uL (ref 4.20–5.82)
RDW: 17.5 % — ABNORMAL HIGH (ref 11.0–14.6)
WBC: 5.6 10*3/uL (ref 4.0–10.3)
lymph#: 1.4 10*3/uL (ref 0.9–3.3)

## 2013-07-23 LAB — FERRITIN CHCC: Ferritin: 52 ng/ml (ref 22–316)

## 2013-07-24 ENCOUNTER — Encounter: Payer: Self-pay | Admitting: Internal Medicine

## 2013-07-24 ENCOUNTER — Ambulatory Visit (INDEPENDENT_AMBULATORY_CARE_PROVIDER_SITE_OTHER): Payer: Self-pay | Admitting: Internal Medicine

## 2013-07-24 ENCOUNTER — Encounter: Payer: Self-pay | Admitting: Physician Assistant

## 2013-07-24 ENCOUNTER — Ambulatory Visit (HOSPITAL_BASED_OUTPATIENT_CLINIC_OR_DEPARTMENT_OTHER): Payer: Medicaid Other | Admitting: Physician Assistant

## 2013-07-24 VITALS — BP 193/106 | HR 70 | Temp 98.1°F | Resp 19 | Ht 69.0 in | Wt 199.3 lb

## 2013-07-24 VITALS — BP 160/102 | HR 71 | Temp 98.5°F | Resp 16 | Ht 69.0 in | Wt 199.0 lb

## 2013-07-24 DIAGNOSIS — D509 Iron deficiency anemia, unspecified: Secondary | ICD-10-CM

## 2013-07-24 DIAGNOSIS — I10 Essential (primary) hypertension: Secondary | ICD-10-CM

## 2013-07-24 MED ORDER — OLMESARTAN MEDOXOMIL-HCTZ 40-12.5 MG PO TABS: 1.0000 | ORAL_TABLET | Freq: Every day | ORAL | Status: DC

## 2013-07-24 NOTE — Progress Notes (Addendum)
[facility] [phone]   [phone]  OFFICE PROGRESS NOTE  Sanda Linger, MD [address] [facility] [address] [place], [address] [place] [address]  PRINCIPAL DIAGNOSIS:  1) Iron deficiency anemia with reactive thrombocytosis.  2) Crohn's colitis with obstruction status post colectomy with end ileostomy and repair enterotomy under the care of Dr. 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  5) dry gangrene of the toes of the right foot, status post transmetatarsal amputation on 05/10/2012.   PRIOR THERAPY: The patient is status post intravenous Feraheme infusion. Last dose was given in June 2012.   CURRENT THERAPY: Observation.   INTERVAL HISTORY: Rick Edwards 57 y.o. male returns to the clinic today for routine six-month followup visit. The patient is feeling fine today with no specific complaints. He denied having any significant fatigue or weakness. He denied having any dizzy spells. The patient had repeat CBC and iron study performed recently and he is here for evaluation and discussion of his lab results. Since last being seen at our office in March of 2013, he has gained a little over 37 pounds. He reports he is a healthy appetite thinks he may need to slow down on the amount of pork that he's been eating. Patient states that he took his blood pressure medication yesterday but did not take it today. He denied any chest pain, shortness of breath or headaches. He is confused as to what his blood pressure medications our which ones he should be taking. He was also recently evaluated at [facility].  MEDICAL HISTORY: Past Medical History  Diagnosis Date  . Crohn's disease     tx. Imuran  . Perianal abscess 2001    s/p right hemicolectomy, and drainage  of retroperitoneal abcess 2003  . Hypertension   . Hearing loss     wears hearing aid left ear; birthed with hearing loss  . GI bleed 6/11    w/ normal EGD 6/11, 3 PRBCs   . Anemia     anemia thrombocytopenia, saw Hematology 2011, can not r/o myeloproliferative d/c  . Transfusion history     last 8'13  . Ileostomy in place 04-11-13    04-18-13 ileostomy to be taken down.  Marland Kitchen HOH (hard of hearing) 05/16/2013  . Antithrombin III deficiency 05/16/2013    On coumadin for this  . Chronic kidney disease 05/27/2013    ACUTE RENAL FAILURE   . Hypercalcemia   . Hyperkalemia 05/27/2013    ALLERGIES:  is allergic to mesalamine.  MEDICATIONS:  Current Outpatient Prescriptions  Medication Sig Dispense Refill  . azaTHIOprine (IMURAN) 50 MG tablet Take 75 mg by mouth daily.      . tadalafil (CIALIS) 5 MG tablet Take 1 tablet (5 mg total) by mouth daily as needed for erectile dysfunction.  60 tablet  0  . warfarin (COUMADIN) 5 MG tablet Take 5 mg by mouth daily. Take 1 tablet (5mg ) every Wed and Saturday and 1.5 tablets (7.5mg ) all other days of the week.      . loperamide (IMODIUM) 2 MG capsule Take 1 capsule (2 mg total) by mouth as needed for diarrhea or loose stools.  30 capsule  0  . oxyCODONE-acetaminophen (PERCOCET/ROXICET) 5-325 MG per tablet Take 1-2 tablets by mouth every 6 (six) hours as needed.  20 tablet  0  . propranolol (INDERAL XL) 120 MG 24 hr capsule Take 1 capsule (120 mg total) by mouth at bedtime.  70 capsule  0   No current facility-administered medications for this visit.    SURGICAL HISTORY:  Past Surgical History  Procedure Laterality Date  . Hemicolectomy  08/29/2001    perforated abscess  . Anal fistulotomy  2002  . Partial colectomy  03/11/2012    Procedure: PARTIAL COLECTOMY;  Surgeon: Mariella Saa, MD;  Location: [facility]S;  Service: General;  Laterality: N/A;  subtotal colectomy transverse and left colon   . Embolectomy  03/12/2012    Procedure: EMBOLECTOMY;   Surgeon: Chuck Hint, MD;  Location: [facility] OR;  Service: Vascular;  Laterality: Right;  Right popliteal embolectomy with vein patch angioplasty, right posterior tibial embolectomy with vein patch angioplasty   . Intraoperative arteriogram  03/12/2012    Procedure: INTRA OPERATIVE ARTERIOGRAM;  Surgeon: Chuck Hint, MD;  Location: [facility] OR;  Service: Vascular;  Laterality: Right;  . Transmetatarsal amputation  05/10/2012    right  . Ileostomy closure N/A 04/18/2013    Procedure: ILEOSTOMY TAKEDOWN;  Surgeon: Mariella Saa, MD;  Location: [facility]S;  Service: General;  Laterality: N/A;    REVIEW OF SYSTEMS:  A comprehensive review of systems was negative.   PHYSICAL EXAMINATION: General appearance: alert, cooperative and no distress Head: Normocephalic, without obvious abnormality, atraumatic Neck: no adenopathy Lymph nodes: Cervical, supraclavicular, and axillary nodes normal. Resp: clear to auscultation bilaterally Cardio: regular rate and rhythm, S1, S2 normal, no murmur, click, rub or gallop GI: soft, non-tender; bowel sounds normal; no masses,  no organomegaly Extremities: extremities normal, atraumatic, no cyanosis or edema  ECOG PERFORMANCE STATUS: 1 - Symptomatic but completely ambulatory  Blood pressure 193/106, pulse 70, temperature 98.1 F (36.7 C), temperature source Oral, resp. rate 19, height 5\' 9"  (1.753 m), weight 199 lb 4.8 oz (90.402 kg). Recheck of the blood pressure revealed in may 193/123  LABORATORY DATA: Lab Results  Component Value Date   WBC 5.6 07/23/2013   HGB 14.0 07/23/2013   HCT 40.7 07/23/2013   MCV 89.7 07/23/2013   PLT 414* 07/23/2013      Chemistry      Component Value Date/Time   NA 132* 05/29/2013 0810   K 4.4 05/29/2013 0810   CL 100 05/29/2013 0810   CO2 19 05/29/2013 0810   BUN 24* 05/29/2013 0810   CREATININE 1.33 05/29/2013 0810   CREATININE 2.48* 05/26/2013 0958      Component Value Date/Time   CALCIUM 8.2*  05/29/2013 0810   ALKPHOS 111 05/18/2013 0720   AST 20 05/18/2013 0720   ALT 47 05/18/2013 0720   BILITOT 0.7 05/18/2013 0720    07/23/2013 - iron studies-ferritin 52, serum iron 57, with 19% saturation    RADIOGRAPHIC STUDIES: No results found.   ASSESSMENT AND PLAN: Patient discussed with him also seen by Dr. Arbutus Ped. 1) iron deficiency anemia: Significantly improved. The patient has no evidence of iron deficiency on the recent blood work. We will release patient back to his primary care physician for further followup of his anemia. We will certainly be available should he need further intervention. 2) Crohn's colitis: Followup with Dr. Madilyn Fireman as scheduled. 3) uncontrolled hypertension-I. personally called Dr. Sanda Linger' office at [facility] and informed him of his blood pressure readings today particularly the recheck of 193/123. Dr. Yetta Barre for work the patient in to his schedule this morning to  evaluate and manage his uncontrolled hypertension.  He was advised to call immediately if he has any concerning symptoms in the interval.  All questions were answered. The patient knows to call the clinic with any problems, questions or concerns. We can certainly see the patient much sooner if necessary.  Conni Slipper, PA_C  ADDENDUM: Hematology/Oncology Attending: I had face to face encounter with the patient. I recommended his care plan. This is a very pleasant 57 years old African American male with history of iron deficiency anemia and reactive thrombocytosis he is status post Feraheme infusion in June of 2012 and he has been on observation since that time was no significant evidence for anemia. I discussed the lab result with the patient today and recommended for him to continue his routine follow up visit and evaluation by his primary care physician. I would be happy to see him in the future 1 as needed basis.  Disclaimer: This note was dictated with voice recognition software.  Similar sounding words can inadvertently be transcribed and may not be corrected upon review. Lajuana Matte., MD 09/06/2013

## 2013-07-24 NOTE — Progress Notes (Signed)
   Subjective:    Patient ID: Rick Edwards, male    DOB: October 29, 1955, 57 y.o.   MRN: [id number]  Hypertension This is a chronic problem. The current episode started more than 1 year ago. The problem has been gradually worsening since onset. The problem is uncontrolled. Pertinent negatives include no anxiety, blurred vision, chest pain, headaches, malaise/fatigue, neck pain, orthopnea, palpitations, peripheral edema, PND, shortness of breath or sweats. Past treatments include beta blockers. Compliance problems include exercise and diet.  Hypertensive end-organ damage includes kidney disease. Identifiable causes of hypertension include chronic renal disease.      Review of Systems  Constitutional: Negative.  Negative for fever, chills, malaise/fatigue, diaphoresis, appetite change and fatigue.  HENT: Negative.   Eyes: Negative.  Negative for blurred vision.  Respiratory: Negative.  Negative for cough, choking, chest tightness and shortness of breath.   Cardiovascular: Negative.  Negative for chest pain, palpitations, orthopnea and PND.  Gastrointestinal: Negative.  Negative for nausea, vomiting, abdominal pain, diarrhea, constipation and blood in stool.  Endocrine: Negative.   Genitourinary: Negative.   Musculoskeletal: Negative.  Negative for neck pain.  Skin: Negative.   Allergic/Immunologic: Negative.   Neurological: Negative.  Negative for headaches.  Hematological: Negative.  Negative for adenopathy. Does not bruise/bleed easily.  Psychiatric/Behavioral: Negative.        Objective:   Physical Exam  Vitals reviewed. Constitutional: He is oriented to person, place, and time. He appears well-developed and well-nourished. No distress.  HENT:  Head: Normocephalic and atraumatic.  Mouth/Throat: Oropharynx is clear and moist. No oropharyngeal exudate.  Eyes: Conjunctivae are normal. Right eye exhibits no discharge. Left eye exhibits no discharge. No scleral icterus.  Neck: Normal  range of motion. Neck supple. No JVD present. No tracheal deviation present. No thyromegaly present.  Cardiovascular: Normal rate, regular rhythm, normal heart sounds and intact distal pulses.  Exam reveals no gallop and no friction rub.   No murmur heard. Pulmonary/Chest: Effort normal and breath sounds normal. No stridor. No respiratory distress. He has no wheezes. He has no rales. He exhibits no tenderness.  Abdominal: Soft. Bowel sounds are normal. He exhibits no distension and no mass. There is no tenderness. There is no rebound and no guarding.  Musculoskeletal: Normal range of motion. He exhibits no edema and no tenderness.  Lymphadenopathy:    He has no cervical adenopathy.  Neurological: He is oriented to person, place, and time.  Skin: Skin is warm and dry. No rash noted. He is not diaphoretic. No erythema. No pallor.  Psychiatric: He has a normal mood and affect. His behavior is normal. Judgment and thought content normal.     Lab Results  Component Value Date   WBC 5.6 07/23/2013   HGB 14.0 07/23/2013   HCT 40.7 07/23/2013   PLT 414* 07/23/2013   GLUCOSE 95 05/29/2013   CHOL 129 03/18/2012   TRIG 186* 03/18/2012   HDL 42.90 09/08/2011   LDLCALC 137* 09/08/2011   ALT 47 05/18/2013   AST 20 05/18/2013   NA 132* 05/29/2013   K 4.4 05/29/2013   CL 100 05/29/2013   CREATININE 1.33 05/29/2013   BUN 24* 05/29/2013   CO2 19 05/29/2013   TSH 1.31 09/08/2011   PSA 3.86 08/15/2012   INR 2.5 07/01/2013       Assessment & Plan:

## 2013-07-24 NOTE — Progress Notes (Signed)
Pre visit review using our clinic review tool, if applicable. No additional management support is needed unless otherwise documented below in the visit note. 

## 2013-07-24 NOTE — Patient Instructions (Signed)
Youmay followup with your primary care physician regarding your anemia as you are stable from our standpoint. Your blood pressure is extremely high and your going to be seen by your primary care physician later this morning for further evaluation and management

## 2013-07-24 NOTE — Patient Instructions (Signed)

## 2013-07-25 ENCOUNTER — Ambulatory Visit: Payer: Self-pay | Admitting: Internal Medicine

## 2013-07-27 NOTE — Assessment & Plan Note (Signed)
His BP is not well controlled Recent labs show good renal function I have asked him to start Benicar-HCT to treat this

## 2013-07-29 ENCOUNTER — Ambulatory Visit (INDEPENDENT_AMBULATORY_CARE_PROVIDER_SITE_OTHER): Payer: Self-pay | Admitting: General Practice

## 2013-07-29 DIAGNOSIS — Z7901 Long term (current) use of anticoagulants: Secondary | ICD-10-CM

## 2013-07-29 DIAGNOSIS — I743 Embolism and thrombosis of arteries of the lower extremities: Secondary | ICD-10-CM

## 2013-07-29 NOTE — Progress Notes (Signed)
Pre-visit discussion using our clinic review tool. No additional management support is needed unless otherwise documented below in the visit note.  

## 2013-08-06 ENCOUNTER — Other Ambulatory Visit: Payer: Self-pay | Admitting: Internal Medicine

## 2013-08-22 ENCOUNTER — Telehealth: Payer: Self-pay | Admitting: Internal Medicine

## 2013-08-22 NOTE — Telephone Encounter (Signed)
Patients father called requesting samples of  propranolol (INDERAL XL) 120 MG 24 hr capsule Please advise

## 2013-08-22 NOTE — Telephone Encounter (Signed)
done

## 2013-08-26 ENCOUNTER — Ambulatory Visit (INDEPENDENT_AMBULATORY_CARE_PROVIDER_SITE_OTHER): Payer: Self-pay | Admitting: General Practice

## 2013-08-26 DIAGNOSIS — Z7901 Long term (current) use of anticoagulants: Secondary | ICD-10-CM

## 2013-08-26 DIAGNOSIS — I743 Embolism and thrombosis of arteries of the lower extremities: Secondary | ICD-10-CM

## 2013-08-26 LAB — POCT INR: INR: 4.8

## 2013-08-26 NOTE — Progress Notes (Signed)
Pre-visit discussion using our clinic review tool. No additional management support is needed unless otherwise documented below in the visit note.  

## 2013-09-15 ENCOUNTER — Telehealth: Payer: Self-pay | Admitting: *Deleted

## 2013-09-15 NOTE — Telephone Encounter (Signed)
Message left on triage line requesting a balance (unclear on message).  Attempted to return call for clarification and to assist, no answer, [facility] on answering machine.   CB# [phone]

## 2013-09-16 ENCOUNTER — Ambulatory Visit (INDEPENDENT_AMBULATORY_CARE_PROVIDER_SITE_OTHER): Payer: Self-pay | Admitting: General Practice

## 2013-09-16 DIAGNOSIS — Z7901 Long term (current) use of anticoagulants: Secondary | ICD-10-CM

## 2013-09-16 DIAGNOSIS — I743 Embolism and thrombosis of arteries of the lower extremities: Secondary | ICD-10-CM

## 2013-09-16 DIAGNOSIS — Z5181 Encounter for therapeutic drug level monitoring: Secondary | ICD-10-CM | POA: Insufficient documentation

## 2013-09-16 LAB — POCT INR: INR: 3

## 2013-09-16 NOTE — Progress Notes (Signed)
Pre-visit discussion using our clinic review tool. No additional management support is needed unless otherwise documented below in the visit note.  

## 2013-09-22 ENCOUNTER — Encounter: Payer: Self-pay | Admitting: Vascular Surgery

## 2013-09-23 ENCOUNTER — Encounter: Payer: Self-pay | Admitting: Vascular Surgery

## 2013-09-24 ENCOUNTER — Encounter (INDEPENDENT_AMBULATORY_CARE_PROVIDER_SITE_OTHER): Payer: Self-pay

## 2013-09-24 ENCOUNTER — Encounter: Payer: Self-pay | Admitting: Vascular Surgery

## 2013-09-24 ENCOUNTER — Ambulatory Visit (INDEPENDENT_AMBULATORY_CARE_PROVIDER_SITE_OTHER): Payer: Self-pay | Admitting: Vascular Surgery

## 2013-09-24 VITALS — BP 154/98 | HR 84 | Temp 98.3°F | Resp 18 | Ht 69.0 in | Wt 207.0 lb

## 2013-09-24 DIAGNOSIS — I739 Peripheral vascular disease, unspecified: Secondary | ICD-10-CM | POA: Insufficient documentation

## 2013-09-24 DIAGNOSIS — Z48812 Encounter for surgical aftercare following surgery on the circulatory system: Secondary | ICD-10-CM

## 2013-09-24 NOTE — Progress Notes (Signed)
[facility] [place]  Patient name: Rick Edwards MRN: [id number] DOB: 01-Jan-1956 Sex: male  REASON FOR VISIT: follow up of peripheral vascular disease  HPI: Rick Edwards is a 58 y.o. male who I last saw a year ago. He underwent a right popliteal and tibial embolectomy for an ischemic right lower extremity. He was found to have an antithrombin 3 deficiency in his been on Coumadin. He ultimately require a transmetatarsal amputation of his right foot in 2013.  Since I saw him last, he denies any history of claudication, rest pain, or nonhealing ulcers. He is requesting and insert for his right transmetatarsal amputation shoe.  Past Medical History  Diagnosis Date  . Crohn's disease     tx. Imuran  . Perianal abscess 2001    s/p right hemicolectomy, and drainage of retroperitoneal abcess 2003  . Hypertension   . Hearing loss     wears hearing aid left ear; birthed with hearing loss  . GI bleed 6/11    w/ normal EGD 6/11, 3 PRBCs   . Anemia     anemia thrombocytopenia, saw Hematology 2011, can not r/o myeloproliferative d/c  . Transfusion history     last 8'13  . Ileostomy in place 04-11-13    04-18-13 ileostomy to be taken down.  Marland Kitchen HOH (hard of hearing) 05/16/2013  . Antithrombin III deficiency 05/16/2013    On coumadin for this  . Chronic kidney disease 05/27/2013    ACUTE RENAL FAILURE   . Hypercalcemia   . Hyperkalemia 05/27/2013   Family History  Problem Relation Age of Onset  . Coronary artery disease Neg Hx   . Diabetes Neg Hx   . Colon cancer Neg Hx   . Prostate cancer Neg Hx   . Stroke      GF, aunts   . Hypertension      "the whole family"  . Hypertension Mother   . Hyperlipidemia Mother   . Hypertension Father   . Hyperlipidemia Father   . Heart disease Father   . Kidney failure Neg Hx    SOCIAL HISTORY: History  Substance Use Topics  . Smoking status: Former Smoker -- 0.25 packs/day for 9 years    Types: Cigarettes   Quit date: 05/09/2011  . Smokeless tobacco: Never Used  . Alcohol Use: No   Allergies  Allergen Reactions  . Mesalamine     REACTION: Rash   Current Outpatient Prescriptions  Medication Sig Dispense Refill  . azaTHIOprine (IMURAN) 50 MG tablet Take 75 mg by mouth daily.      Marland Kitchen olmesartan-hydrochlorothiazide (BENICAR HCT) 40-12.5 MG per tablet Take 1 tablet by mouth daily.  70 tablet  0  . tadalafil (CIALIS) 5 MG tablet Take 1 tablet (5 mg total) by mouth daily as needed for erectile dysfunction.  60 tablet  0  . warfarin (COUMADIN) 5 MG tablet Take 5 mg by mouth daily. Take 1 tablet (5mg ) every Wed and Saturday and 1.5 tablets (7.5mg ) all other days of the week.      . warfarin (COUMADIN) 5 MG tablet TAKE AS DIRECTED  45 tablet  2  . loperamide (IMODIUM) 2 MG capsule Take 1 capsule (2 mg total) by mouth as needed for diarrhea or loose stools.  30 capsule  0  . oxyCODONE-acetaminophen (PERCOCET/ROXICET) 5-325 MG per tablet Take 1-2 tablets by mouth every 6 (six) hours as needed.  20 tablet  0  . propranolol (INDERAL XL) 120 MG 24 hr  capsule Take 1 capsule (120 mg total) by mouth at bedtime.  70 capsule  0   No current facility-administered medications for this visit.   REVIEW OF SYSTEMS: Valu.Nieves ] denotes positive finding; [  ] denotes negative finding  CARDIOVASCULAR:  [ ]  chest pain   [ ]  chest pressure   [ ]  palpitations   [ ]  orthopnea   [ ]  dyspnea on exertion   [ ]  claudication   [ ]  rest pain   [ ]  DVT   [ ]  phlebitis PULMONARY:   [ ]  productive cough   [ ]  asthma   [ ]  wheezing NEUROLOGIC:   [ ]  weakness  [ ]  paresthesias  [ ]  aphasia  [ ]  amaurosis  [ ]  dizziness HEMATOLOGIC:   [ ]  bleeding problems   [ ]  clotting disorders MUSCULOSKELETAL:  [ ]  joint pain   [ ]  joint swelling [ ]  leg swelling GASTROINTESTINAL: [ ]   blood in stool  [ ]   hematemesis GENITOURINARY:  [ ]   dysuria  [ ]   hematuria PSYCHIATRIC:  [ ]  history of major depression INTEGUMENTARY:  [ ]  rashes  [ ]   ulcers CONSTITUTIONAL:  [ ]  fever   [ ]  chills  PHYSICAL EXAM: Filed Vitals:   09/24/13 0953  BP: 154/98  Pulse: 84  Temp: 98.3 F (36.8 C)  TempSrc: Oral  Resp: 18  Height: 5\' 9"  (1.753 m)  Weight: 207 lb (93.895 kg)  SpO2: 99%   Body mass index is 30.55 kg/(m^2). GENERAL: The patient is a well-nourished male, in no acute distress. The vital signs are documented above. CARDIOVASCULAR: There is a regular rate and rhythm. I do not detect carotid bruits. He has palpable femoral pulses. I cannot palpate pulses in the right foot. He does have a palpable left dorsalis pedis and posterior tibial pulse. PULMONARY: There is good air exchange bilaterally without wheezing or rales. ABDOMEN: Soft and non-tender with normal pitched bowel sounds.  MUSCULOSKELETAL: He has a right transmetatarsal amputation which is well healed. NEUROLOGIC: No focal weakness or paresthesias are detected. SKIN: There are no ulcers or rashes noted. PSYCHIATRIC: The patient has a normal affect.  MEDICAL ISSUES:  PVD (peripheral vascular disease) The patient is doing well status post right popliteal and tibial embolectomy. He does have some evidence of tibial artery occlusive disease on exam. He is asymptomatic. For this reason I will stretch his follow-up out to 18 months. I will see him back in 18 months with ABIs. He knows to call sooner if he has problems. Fortunately he is not a smoker. He remains fairly active. He remains on Coumadin for his anti-thrombin 3 deficiency.   [place] [facility] [place] Beeper: [phone]

## 2013-09-24 NOTE — Assessment & Plan Note (Signed)
The patient is doing well status post right popliteal and tibial embolectomy. He does have some evidence of tibial artery occlusive disease on exam. He is asymptomatic. For this reason I will stretch his follow-up out to 18 months. I will see him back in 18 months with ABIs. He knows to call sooner if he has problems. Fortunately he is not a smoker. He remains fairly active. He remains on Coumadin for his anti-thrombin 3 deficiency.

## 2013-09-26 NOTE — Addendum Note (Signed)
Addended by: Dorthula Rue L on: 09/26/2013 04:35 PM   Modules accepted: Orders

## 2013-10-17 ENCOUNTER — Ambulatory Visit (INDEPENDENT_AMBULATORY_CARE_PROVIDER_SITE_OTHER): Payer: Self-pay | Admitting: General Practice

## 2013-10-17 DIAGNOSIS — Z5181 Encounter for therapeutic drug level monitoring: Secondary | ICD-10-CM

## 2013-10-17 DIAGNOSIS — I743 Embolism and thrombosis of arteries of the lower extremities: Secondary | ICD-10-CM

## 2013-10-17 DIAGNOSIS — Z7901 Long term (current) use of anticoagulants: Secondary | ICD-10-CM

## 2013-10-17 LAB — POCT INR: INR: 2.7

## 2013-10-17 NOTE — Progress Notes (Signed)
Pre visit review using our clinic review tool, if applicable. No additional management support is needed unless otherwise documented below in the visit note. 

## 2013-10-21 ENCOUNTER — Telehealth: Payer: Self-pay | Admitting: Internal Medicine

## 2013-10-21 DIAGNOSIS — I10 Essential (primary) hypertension: Secondary | ICD-10-CM

## 2013-10-21 NOTE — Telephone Encounter (Signed)
Patient is calling to request more samples of Benicar 20mg . Please advise.

## 2013-10-22 ENCOUNTER — Telehealth: Payer: Self-pay

## 2013-10-22 MED ORDER — OLMESARTAN MEDOXOMIL-HCTZ 40-12.5 MG PO TABS: 1.0000 | ORAL_TABLET | Freq: Every day | ORAL | Status: DC

## 2013-10-22 NOTE — Telephone Encounter (Signed)
Father notified to call back with the correct dosage, benicar 20mg  is not on current med list

## 2013-10-22 NOTE — Telephone Encounter (Signed)
No samples available at this time, pt notified.

## 2013-10-22 NOTE — Telephone Encounter (Signed)
The patient called and is hoping to get samples of Cialis   Callback - [phone]

## 2013-11-03 ENCOUNTER — Other Ambulatory Visit: Payer: Self-pay | Admitting: Internal Medicine

## 2013-11-04 ENCOUNTER — Other Ambulatory Visit: Payer: Self-pay | Admitting: Internal Medicine

## 2013-11-04 ENCOUNTER — Other Ambulatory Visit: Payer: Self-pay | Admitting: General Practice

## 2013-11-14 ENCOUNTER — Ambulatory Visit (INDEPENDENT_AMBULATORY_CARE_PROVIDER_SITE_OTHER): Payer: Self-pay | Admitting: General Practice

## 2013-11-14 DIAGNOSIS — I743 Embolism and thrombosis of arteries of the lower extremities: Secondary | ICD-10-CM

## 2013-11-14 DIAGNOSIS — Z7901 Long term (current) use of anticoagulants: Secondary | ICD-10-CM

## 2013-11-14 DIAGNOSIS — Z5181 Encounter for therapeutic drug level monitoring: Secondary | ICD-10-CM

## 2013-11-14 LAB — POCT INR: INR: 2.7

## 2013-11-14 NOTE — Progress Notes (Signed)
Pre visit review using our clinic review tool, if applicable. No additional management support is needed unless otherwise documented below in the visit note. 

## 2013-11-19 ENCOUNTER — Telehealth: Payer: Self-pay | Admitting: Internal Medicine

## 2013-11-19 NOTE — Telephone Encounter (Signed)
Pt came in to pick up sample for Innopran XL. Pt stated that there is 2 different dosage on the bottle, one is 120 mg and 80 mg. Pt request an explanation.  Please call pt

## 2013-11-20 NOTE — Telephone Encounter (Signed)
Please verify if samples listed were given, unsure of what exact BP meds should be taken.

## 2013-11-20 NOTE — Telephone Encounter (Signed)
He needs to be seen

## 2013-11-24 ENCOUNTER — Encounter: Payer: Self-pay | Admitting: Internal Medicine

## 2013-11-24 ENCOUNTER — Other Ambulatory Visit (INDEPENDENT_AMBULATORY_CARE_PROVIDER_SITE_OTHER): Payer: Medicaid Other

## 2013-11-24 ENCOUNTER — Ambulatory Visit (INDEPENDENT_AMBULATORY_CARE_PROVIDER_SITE_OTHER): Payer: Self-pay | Admitting: Internal Medicine

## 2013-11-24 VITALS — BP 158/98 | HR 75 | Temp 98.2°F | Resp 16 | Ht 69.0 in | Wt 216.0 lb

## 2013-11-24 DIAGNOSIS — E875 Hyperkalemia: Secondary | ICD-10-CM

## 2013-11-24 DIAGNOSIS — IMO0002 Reserved for concepts with insufficient information to code with codable children: Secondary | ICD-10-CM

## 2013-11-24 DIAGNOSIS — I10 Essential (primary) hypertension: Secondary | ICD-10-CM

## 2013-11-24 DIAGNOSIS — N183 Chronic kidney disease, stage 3 unspecified: Secondary | ICD-10-CM

## 2013-11-24 DIAGNOSIS — M1710 Unilateral primary osteoarthritis, unspecified knee: Secondary | ICD-10-CM

## 2013-11-24 DIAGNOSIS — M1711 Unilateral primary osteoarthritis, right knee: Secondary | ICD-10-CM

## 2013-11-24 LAB — URINALYSIS, ROUTINE W REFLEX MICROSCOPIC
BILIRUBIN URINE: NEGATIVE
KETONES UR: NEGATIVE
LEUKOCYTES UA: NEGATIVE
Nitrite: NEGATIVE
PH: 5.5 (ref 5.0–8.0)
Specific Gravity, Urine: >=1.03 — AB (ref 1.000–1.030)
Total Protein, Urine: 30 — AB
UROBILINOGEN UA: 0.2 (ref 0.0–1.0)
Urine Glucose: NEGATIVE

## 2013-11-24 LAB — BASIC METABOLIC PANEL
BUN: 12 mg/dL (ref 6–23)
CO2: 23 mEq/L (ref 19–32)
CREATININE: 0.9 mg/dL (ref 0.4–1.5)
Calcium: 8.8 mg/dL (ref 8.4–10.5)
Chloride: 106 mEq/L (ref 96–112)
GFR: 116.97 mL/min (ref >60.00)
Glucose, Bld: 94 mg/dL (ref 70–99)
Potassium: 3.5 mEq/L (ref 3.5–5.1)
Sodium: 138 mEq/L (ref 135–145)

## 2013-11-24 LAB — TSH: TSH: 1.38 u[IU]/mL (ref 0.35–5.50)

## 2013-11-24 MED ORDER — METHYLPREDNISOLONE ACETATE 40 MG/ML IJ SUSP
40.0000 mg | Freq: Once | INTRAMUSCULAR | Status: AC
  Administered 2013-11-24: 40 mg via INTRAMUSCULAR

## 2013-11-24 MED ORDER — PROPRANOLOL HCL ER BEADS 120 MG PO CP24: 120.0000 mg | ORAL_CAPSULE | Freq: Every day | ORAL | Status: DC

## 2013-11-24 NOTE — Progress Notes (Signed)
Subjective:    Patient ID: Rick Edwards, male    DOB: 11-06-1955, 58 y.o.   MRN: [id number]  Hypertension This is a chronic problem. The current episode started more than 1 year ago. The problem has been gradually worsening since onset. The problem is uncontrolled. Pertinent negatives include no anxiety, blurred vision, chest pain, headaches, malaise/fatigue, neck pain, orthopnea, palpitations, peripheral edema, PND, shortness of breath or sweats. Past treatments include beta blockers. The current treatment provides moderate improvement. Compliance problems include psychosocial issues (he has not taken the inderal in several weeks).  Hypertensive end-organ damage includes kidney disease. Identifiable causes of hypertension include chronic renal disease.      Review of Systems  Constitutional: Negative.  Negative for fever, chills, malaise/fatigue, diaphoresis, appetite change and fatigue.  HENT: Negative.   Eyes: Negative.  Negative for blurred vision.  Respiratory: Negative.  Negative for cough, choking, chest tightness, shortness of breath, wheezing and stridor.   Cardiovascular: Negative.  Negative for chest pain, palpitations, orthopnea, leg swelling and PND.  Gastrointestinal: Negative.  Negative for nausea, vomiting, abdominal pain, diarrhea, constipation and blood in stool.  Endocrine: Negative.   Genitourinary: Negative.   Musculoskeletal: Positive for arthralgias. Negative for back pain, gait problem, joint swelling, myalgias, neck pain and neck stiffness.       He has had right knee pain for many years, today he complains of some aching and swelling in his right knee and he wants to get a steroid injection in the knee. He has tried this before and it helped with this knee.  Skin: Negative.   Allergic/Immunologic: Negative.   Neurological: Negative.  Negative for dizziness, tremors, light-headedness and headaches.  Hematological: Negative.  Negative for adenopathy. Does not  bruise/bleed easily.  Psychiatric/Behavioral: Negative.        Objective:   Physical Exam  Vitals reviewed. Constitutional: He is oriented to person, place, and time. He appears well-developed and well-nourished. No distress.  HENT:  Head: Normocephalic and atraumatic.  Mouth/Throat: Oropharynx is clear and moist. No oropharyngeal exudate.  Eyes: Conjunctivae are normal. Right eye exhibits no discharge. Left eye exhibits no discharge. No scleral icterus.  Neck: Normal range of motion. Neck supple. No JVD present. No tracheal deviation present. No thyromegaly present.  Cardiovascular: Normal rate, regular rhythm, normal heart sounds and intact distal pulses.  Exam reveals no gallop and no friction rub.   No murmur heard. Pulmonary/Chest: Effort normal and breath sounds normal. No stridor. No respiratory distress. He has no wheezes. He has no rales. He exhibits no tenderness.  Abdominal: Soft. Bowel sounds are normal. He exhibits no distension and no mass. There is no tenderness. There is no rebound and no guarding.  Musculoskeletal: Normal range of motion. He exhibits no edema and no tenderness.       Right knee: He exhibits swelling and deformity (there is mild crepitance in his knee). He exhibits normal range of motion, no effusion, no ecchymosis, no laceration, no erythema, normal alignment, no LCL laxity, normal patellar mobility and no bony tenderness. No tenderness found.  Rt knee was cleaned with betadine then prepped and draped in sterile fashion, local anesthesia was obtained with 1 cc of 2% lido with epi, next the joint space was approached from the medial approach. The joint space was easily injected using a 1.5 in 23 gauge needle and 40 mg depo-medrol and 1/2 cc of plain 2% lido and 1/2 cc of plain 0.5% marcaine were injected into the joint space. He  tolerated this well with no complications.  Lymphadenopathy:    He has no cervical adenopathy.  Neurological: He is oriented to  person, place, and time.  Skin: Skin is warm and dry. No rash noted. He is not diaphoretic. No erythema. No pallor.  Psychiatric: He has a normal mood and affect. His behavior is normal. Judgment and thought content normal.     Lab Results  Component Value Date   WBC 5.6 07/23/2013   HGB 14.0 07/23/2013   HCT 40.7 07/23/2013   PLT 414* 07/23/2013   GLUCOSE 95 05/29/2013   CHOL 129 03/18/2012   TRIG 186* 03/18/2012   HDL 42.90 09/08/2011   LDLCALC 137* 09/08/2011   ALT 47 05/18/2013   AST 20 05/18/2013   NA 132* 05/29/2013   K 4.4 05/29/2013   CL 100 05/29/2013   CREATININE 1.33 05/29/2013   BUN 24* 05/29/2013   CO2 19 05/29/2013   TSH 1.31 09/08/2011   PSA 3.86 08/15/2012   INR 2.7 11/14/2013       Assessment & Plan:

## 2013-11-24 NOTE — Patient Instructions (Signed)

## 2013-11-25 ENCOUNTER — Telehealth: Payer: Self-pay | Admitting: Internal Medicine

## 2013-11-25 ENCOUNTER — Other Ambulatory Visit: Payer: Self-pay | Admitting: *Deleted

## 2013-11-25 ENCOUNTER — Encounter: Payer: Self-pay | Admitting: Internal Medicine

## 2013-11-25 DIAGNOSIS — M1711 Unilateral primary osteoarthritis, right knee: Secondary | ICD-10-CM | POA: Insufficient documentation

## 2013-11-25 DIAGNOSIS — I10 Essential (primary) hypertension: Secondary | ICD-10-CM

## 2013-11-25 DIAGNOSIS — M179 Osteoarthritis of knee, unspecified: Secondary | ICD-10-CM | POA: Insufficient documentation

## 2013-11-25 MED ORDER — PROPRANOLOL HCL ER BEADS 120 MG PO CP24: 120.0000 mg | ORAL_CAPSULE | Freq: Every day | ORAL | Status: DC

## 2013-11-25 NOTE — Assessment & Plan Note (Signed)
His renal function has inproved

## 2013-11-25 NOTE — Telephone Encounter (Signed)
Relevant patient education assigned to patient using Emmi. ° °

## 2013-11-25 NOTE — Assessment & Plan Note (Signed)
His BP is not well controlled I have asked him to restart the inderal and to return soon for a recheck on the BP

## 2013-11-25 NOTE — Assessment & Plan Note (Signed)
The joint was successfully injected

## 2013-12-16 ENCOUNTER — Ambulatory Visit (INDEPENDENT_AMBULATORY_CARE_PROVIDER_SITE_OTHER): Payer: Medicaid Other | Admitting: Family Medicine

## 2013-12-16 DIAGNOSIS — Z5181 Encounter for therapeutic drug level monitoring: Secondary | ICD-10-CM

## 2013-12-16 DIAGNOSIS — I743 Embolism and thrombosis of arteries of the lower extremities: Secondary | ICD-10-CM

## 2013-12-16 DIAGNOSIS — Z7901 Long term (current) use of anticoagulants: Secondary | ICD-10-CM

## 2013-12-16 LAB — POCT INR: INR: 3.3

## 2014-01-13 ENCOUNTER — Ambulatory Visit (INDEPENDENT_AMBULATORY_CARE_PROVIDER_SITE_OTHER): Payer: Self-pay | Admitting: General Practice

## 2014-01-13 DIAGNOSIS — I743 Embolism and thrombosis of arteries of the lower extremities: Secondary | ICD-10-CM

## 2014-01-13 DIAGNOSIS — Z7901 Long term (current) use of anticoagulants: Secondary | ICD-10-CM

## 2014-01-13 DIAGNOSIS — Z5181 Encounter for therapeutic drug level monitoring: Secondary | ICD-10-CM

## 2014-01-13 LAB — POCT INR: INR: 3.8

## 2014-01-13 NOTE — Progress Notes (Signed)
Pre visit review using our clinic review tool, if applicable. No additional management support is needed unless otherwise documented below in the visit note. 

## 2014-02-10 ENCOUNTER — Ambulatory Visit (INDEPENDENT_AMBULATORY_CARE_PROVIDER_SITE_OTHER): Payer: Self-pay | Admitting: General Practice

## 2014-02-10 DIAGNOSIS — I743 Embolism and thrombosis of arteries of the lower extremities: Secondary | ICD-10-CM

## 2014-02-10 DIAGNOSIS — Z7901 Long term (current) use of anticoagulants: Secondary | ICD-10-CM

## 2014-02-10 DIAGNOSIS — Z5181 Encounter for therapeutic drug level monitoring: Secondary | ICD-10-CM

## 2014-02-10 LAB — POCT INR: INR: 2.2

## 2014-02-10 NOTE — Progress Notes (Signed)
Pre visit review using our clinic review tool, if applicable. No additional management support is needed unless otherwise documented below in the visit note. 

## 2014-02-17 ENCOUNTER — Encounter: Payer: Self-pay | Admitting: Gastroenterology

## 2014-02-24 ENCOUNTER — Encounter: Payer: Self-pay | Admitting: Internal Medicine

## 2014-02-24 ENCOUNTER — Other Ambulatory Visit (INDEPENDENT_AMBULATORY_CARE_PROVIDER_SITE_OTHER): Payer: Medicaid Other

## 2014-02-24 ENCOUNTER — Ambulatory Visit (INDEPENDENT_AMBULATORY_CARE_PROVIDER_SITE_OTHER): Payer: Medicaid Other | Admitting: Internal Medicine

## 2014-02-24 ENCOUNTER — Other Ambulatory Visit: Payer: Medicaid Other

## 2014-02-24 VITALS — BP 200/130 | HR 80 | Temp 98.2°F | Wt 217.6 lb

## 2014-02-24 DIAGNOSIS — R361 Hematospermia: Secondary | ICD-10-CM

## 2014-02-24 DIAGNOSIS — R82998 Other abnormal findings in urine: Secondary | ICD-10-CM

## 2014-02-24 DIAGNOSIS — I10 Essential (primary) hypertension: Secondary | ICD-10-CM

## 2014-02-24 DIAGNOSIS — R8290 Unspecified abnormal findings in urine: Secondary | ICD-10-CM

## 2014-02-24 LAB — CBC WITH DIFFERENTIAL/PLATELET
BASOS ABS: 0 10*3/uL (ref 0.0–0.1)
BASOS PCT: 0.6 % (ref 0.0–3.0)
Eosinophils Absolute: 0.1 10*3/uL (ref 0.0–0.7)
Eosinophils Relative: 2 % (ref 0.0–5.0)
HEMATOCRIT: 45.8 % (ref 39.0–52.0)
HEMOGLOBIN: 15.9 g/dL (ref 13.0–17.0)
Lymphocytes Relative: 25.5 % (ref 12.0–46.0)
Lymphs Abs: 1.4 10*3/uL (ref 0.7–4.0)
MCHC: 34.6 g/dL (ref 30.0–36.0)
MCV: 93.8 fl (ref 78.0–100.0)
MONOS PCT: 11 % (ref 3.0–12.0)
Monocytes Absolute: 0.6 10*3/uL (ref 0.1–1.0)
NEUTROS ABS: 3.5 10*3/uL (ref 1.4–7.7)
Neutrophils Relative %: 60.9 % (ref 43.0–77.0)
Platelets: 344 10*3/uL (ref 150.0–400.0)
RBC: 4.88 Mil/uL (ref 4.22–5.81)
RDW: 14.4 % (ref 11.5–15.5)
WBC: 5.7 10*3/uL (ref 4.0–10.5)

## 2014-02-24 LAB — POCT URINALYSIS DIPSTICK
Bilirubin, UA: NEGATIVE
Glucose, UA: NEGATIVE
Ketones, UA: NEGATIVE
Leukocytes, UA: NEGATIVE
NITRITE UA: NEGATIVE
SPEC GRAV UA: 1.02
Urobilinogen, UA: 0.2
pH, UA: 6

## 2014-02-24 LAB — PROTIME-INR
INR: 3.7 ratio — ABNORMAL HIGH (ref 0.8–1.0)
Prothrombin Time: 39.5 s — ABNORMAL HIGH (ref 9.6–13.1)

## 2014-02-24 MED ORDER — PROPRANOLOL HCL ER BEADS 120 MG PO CP24
ORAL_CAPSULE | ORAL | Status: DC
Start: 2014-02-24 — End: 2014-10-16

## 2014-02-24 NOTE — Progress Notes (Signed)
Pre visit review using our clinic review tool, if applicable. No additional management support is needed unless otherwise documented below in the visit note. 

## 2014-02-24 NOTE — Patient Instructions (Addendum)
Your ideal BP goal = AVERAGE < 135/85.Minimal goal is average < 140/90. Avoid ingestion of  excess salt/sodium.Cook with pepper & other spices . Use the salt substitute "No Salt" OR the Mrs Deliah Boston products to season food @ the table. Avoid foods which taste salty or "vinegary" as their sodium content will be high.  Make appointment to see Dr Ronnald Ramp as we discussed. Urology referral if blood in urine @ that visit.

## 2014-02-24 NOTE — Progress Notes (Signed)
   Subjective:    Patient ID: Rick Edwards, male    DOB: [birth year], 58 y.o.   MRN: [id number]  HPI   He noted  blood in his urine over several minutes the morning 7/20 when he urinated with the first void in the morning. He had intercourse the evening of 02/22/14  It has not recurred since. There was no pain or pyuria.  His last PT/INR was 2.2 approximately 2 weeks ago  He is not monitoring his blood pressure.  He is not physically active due to amputation of the toes of the right foot  He salts his food liberally.  He has no bleeding dyscrasias.  He has no active cardiac symptoms.    Review of Systems Epistaxis, hemoptysis,  melena, or rectal bleeding denied. No unexplained weight loss or abdominal pain.  There is no abnormal bruising , bleeding, or difficulty stopping bleeding with injury other than that related to warfarin therapy.  Chest pain, palpitations, tachycardia, exertional dyspnea, paroxysmal nocturnal dyspnea, claudication or edema are absent.        Objective:   Physical Exam    He appears healthy and well-nourished  Arteriolar narrowing is present without retinal hemorrhage  There is wax in the right ear;hearing aid in the left.  He has severe caries and staining of the teeth  Abdomen is protuberant without organomegaly or masses. There is a large keloid operative scar of the abdomen.  Toes right foot are amputated.  All pulses are intact in the feet.  He has a small nevus in the right perineal area on the scrotum. Genital exam is unremarkable  Repeat blood pressure was 180/120.   No carotid bruits are present.No neck pain distention present at 10 - 15 degrees. Thyroid normal to palpation  Heart rhythm and rate are normal with no gallop or murmur  Chest is clear with no increased work of breathing. BS slightly decreased  There is no evidence of aortic aneurysm or renal artery bruits  No HJR  No clubbing, cyanosis or edema  present.  No ischemic skin changes are present . Fingernails healthy   Alert and oriented. Strength, tone, DTRs reflexes normal          Assessment & Plan:  #1 hematospermia  #2 severely uncontrolled blood pressure  Plan see orders and recommendation

## 2014-02-25 ENCOUNTER — Telehealth: Payer: Self-pay | Admitting: *Deleted

## 2014-02-25 LAB — URINE CULTURE
Colony Count: NO GROWTH
ORGANISM ID, BACTERIA: NO GROWTH

## 2014-02-25 NOTE — Telephone Encounter (Signed)
Left msg on triage stating son had appt yesterday & he did not understand what all the md said at the visit. Requesting call bck from nurse. Called father back inform him md increase his BP med due to his BP being elevated, and to limit spicy food, no salt. MD should have gave him a after summary at the end of visit...Rick Edwards

## 2014-02-25 NOTE — Telephone Encounter (Signed)
Pharmacist Katrina caaled and stated that the rx Inderal XL was sent over med is over $1000. In the past pt received the generic Inderal LA which is propanolol. Wanting to know is it ok to give propanolol. Inform pharmacist pt ok to continue taking the propanolol that he was previously rx...Rick Edwards

## 2014-02-26 ENCOUNTER — Ambulatory Visit (INDEPENDENT_AMBULATORY_CARE_PROVIDER_SITE_OTHER): Payer: Medicaid Other | Admitting: General Practice

## 2014-02-26 DIAGNOSIS — Z5181 Encounter for therapeutic drug level monitoring: Secondary | ICD-10-CM

## 2014-02-26 DIAGNOSIS — I743 Embolism and thrombosis of arteries of the lower extremities: Secondary | ICD-10-CM

## 2014-02-26 NOTE — Progress Notes (Signed)
Pre visit review using our clinic review tool, if applicable. No additional management support is needed unless otherwise documented below in the visit note. 

## 2014-03-13 ENCOUNTER — Ambulatory Visit (INDEPENDENT_AMBULATORY_CARE_PROVIDER_SITE_OTHER): Payer: Self-pay | Admitting: Internal Medicine

## 2014-03-13 ENCOUNTER — Encounter: Payer: Self-pay | Admitting: Internal Medicine

## 2014-03-13 ENCOUNTER — Other Ambulatory Visit (INDEPENDENT_AMBULATORY_CARE_PROVIDER_SITE_OTHER): Payer: Self-pay

## 2014-03-13 VITALS — BP 230/130 | HR 76 | Temp 97.9°F | Resp 16 | Ht 69.0 in | Wt 217.0 lb

## 2014-03-13 DIAGNOSIS — R809 Proteinuria, unspecified: Secondary | ICD-10-CM | POA: Insufficient documentation

## 2014-03-13 DIAGNOSIS — K5010 Crohn's disease of large intestine without complications: Secondary | ICD-10-CM

## 2014-03-13 DIAGNOSIS — N183 Chronic kidney disease, stage 3 unspecified: Secondary | ICD-10-CM

## 2014-03-13 DIAGNOSIS — I10 Essential (primary) hypertension: Secondary | ICD-10-CM

## 2014-03-13 LAB — BASIC METABOLIC PANEL
BUN: 8 mg/dL (ref 6–23)
CHLORIDE: 98 meq/L (ref 96–112)
CO2: 26 mEq/L (ref 19–32)
Calcium: 8.7 mg/dL (ref 8.4–10.5)
Creatinine, Ser: 0.8 mg/dL (ref 0.4–1.5)
GFR: 123.37 mL/min (ref >60.00)
GLUCOSE: 97 mg/dL (ref 70–99)
Potassium: 3.5 mEq/L (ref 3.5–5.1)
Sodium: 135 mEq/L (ref 135–145)

## 2014-03-13 LAB — URINALYSIS, ROUTINE W REFLEX MICROSCOPIC
BILIRUBIN URINE: NEGATIVE
KETONES UR: NEGATIVE
Leukocytes, UA: NEGATIVE
Nitrite: NEGATIVE
Specific Gravity, Urine: <=1.005 — AB (ref 1.000–1.030)
TOTAL PROTEIN, URINE-UPE24: 30 — AB
Urine Glucose: NEGATIVE
Urobilinogen, UA: 0.2 (ref 0.0–1.0)
pH: 6 (ref 5.0–8.0)

## 2014-03-13 LAB — TSH: TSH: 0.14 u[IU]/mL — ABNORMAL LOW (ref 0.35–4.50)

## 2014-03-13 MED ORDER — OLMESARTAN-AMLODIPINE-HCTZ 40-10-25 MG PO TABS: 1.0000 | ORAL_TABLET | Freq: Every day | ORAL | Status: DC

## 2014-03-13 NOTE — Progress Notes (Signed)
   Subjective:    Patient ID: Rick Edwards, male    DOB: 07-16-1956, 58 y.o.   MRN: [id number]  Hypertension This is a chronic problem. The current episode started more than 1 year ago. The problem has been gradually worsening since onset. The problem is uncontrolled. Pertinent negatives include no anxiety, blurred vision, chest pain, headaches, malaise/fatigue, neck pain, orthopnea, palpitations, peripheral edema, PND, shortness of breath or sweats. There are no associated agents to hypertension. Past treatments include beta blockers. The current treatment provides mild improvement. Compliance problems include diet and exercise.  Hypertensive end-organ damage includes kidney disease.      Review of Systems  Constitutional: Negative.  Negative for fever, chills, malaise/fatigue, diaphoresis, appetite change and fatigue.  HENT: Negative.   Eyes: Negative.  Negative for blurred vision.  Respiratory: Negative.  Negative for apnea, cough, choking, chest tightness, shortness of breath, wheezing and stridor.   Cardiovascular: Negative.  Negative for chest pain, palpitations, orthopnea, leg swelling and PND.  Gastrointestinal: Negative.  Negative for nausea, vomiting, abdominal pain, diarrhea, constipation and blood in stool.  Endocrine: Negative.   Genitourinary: Negative.   Musculoskeletal: Negative.  Negative for arthralgias, back pain and neck pain.  Skin: Negative.  Negative for rash.  Allergic/Immunologic: Negative.   Neurological: Negative.  Negative for dizziness and headaches.  Hematological: Negative.  Negative for adenopathy. Does not bruise/bleed easily.  Psychiatric/Behavioral: Negative.        Objective:   Physical Exam  Vitals reviewed. Constitutional: He is oriented to person, place, and time. He appears well-developed and well-nourished. No distress.  HENT:  Head: Normocephalic and atraumatic.  Mouth/Throat: Oropharynx is clear and moist. No oropharyngeal exudate.    Eyes: Conjunctivae are normal. Right eye exhibits no discharge. Left eye exhibits no discharge. No scleral icterus.  Neck: Normal range of motion. Neck supple. No JVD present. No tracheal deviation present. No thyromegaly present.  Cardiovascular: Normal rate, regular rhythm, normal heart sounds and intact distal pulses.  Exam reveals no gallop and no friction rub.   No murmur heard. Pulmonary/Chest: Effort normal and breath sounds normal. No stridor. No respiratory distress. He has no wheezes. He has no rales. He exhibits no tenderness.  Abdominal: Soft. Bowel sounds are normal. He exhibits no distension and no mass. There is no tenderness. There is no rebound and no guarding.  Musculoskeletal: Normal range of motion. He exhibits no edema and no tenderness.  Lymphadenopathy:    He has no cervical adenopathy.  Neurological: He is oriented to person, place, and time.  Skin: Skin is warm and dry. No rash noted. He is not diaphoretic. No erythema. No pallor.  Psychiatric: He has a normal mood and affect. His behavior is normal. Judgment and thought content normal.      Lab Results  Component Value Date   WBC 5.7 02/24/2014   HGB 15.9 02/24/2014   HCT 45.8 02/24/2014   PLT 344.0 02/24/2014   GLUCOSE 94 11/24/2013   CHOL 129 03/18/2012   TRIG 186* 03/18/2012   HDL 42.90 09/08/2011   LDLCALC 137* 09/08/2011   ALT 47 05/18/2013   AST 20 05/18/2013   NA 138 11/24/2013   K 3.5 11/24/2013   CL 106 11/24/2013   CREATININE 0.9 11/24/2013   BUN 12 11/24/2013   CO2 23 11/24/2013   TSH 1.38 11/24/2013   PSA 3.86 08/15/2012   INR 3.7* 02/24/2014      Assessment & Plan:

## 2014-03-13 NOTE — Progress Notes (Signed)
Pre visit review using our clinic review tool, if applicable. No additional management support is needed unless otherwise documented below in the visit note. 

## 2014-03-13 NOTE — Patient Instructions (Signed)

## 2014-03-15 ENCOUNTER — Encounter: Payer: Self-pay | Admitting: Internal Medicine

## 2014-03-15 NOTE — Assessment & Plan Note (Signed)
His renal function is stable though he has mild proteinuria Will check a renal U/S to see if there is structural kidney disease

## 2014-03-15 NOTE — Assessment & Plan Note (Signed)
His BP is dangerously elevated so I have asked him to add tribenzor to the beta-blocker therapy I have order an U/S to look for RAS and to see if there is evidence of structural kidney disease, cysts, strophy, etc. Will also check his lytes and renal function today

## 2014-03-24 ENCOUNTER — Encounter: Payer: Self-pay | Admitting: Gastroenterology

## 2014-03-24 ENCOUNTER — Ambulatory Visit (INDEPENDENT_AMBULATORY_CARE_PROVIDER_SITE_OTHER): Payer: Medicaid Other | Admitting: *Deleted

## 2014-03-24 DIAGNOSIS — Z5181 Encounter for therapeutic drug level monitoring: Secondary | ICD-10-CM

## 2014-03-24 DIAGNOSIS — I743 Embolism and thrombosis of arteries of the lower extremities: Secondary | ICD-10-CM

## 2014-03-24 DIAGNOSIS — Z7901 Long term (current) use of anticoagulants: Secondary | ICD-10-CM

## 2014-03-24 LAB — POCT INR: INR: 4

## 2014-03-26 ENCOUNTER — Ambulatory Visit
Admission: RE | Admit: 2014-03-26 | Discharge: 2014-03-26 | Disposition: A | Discharge status: Home / Self Care | Payer: Self-pay | Source: Ambulatory Visit | Attending: Internal Medicine | Admitting: Internal Medicine

## 2014-03-26 ENCOUNTER — Ambulatory Visit
Admission: RE | Admit: 2014-03-26 | Discharge: 2014-03-26 | Disposition: A | Discharge status: Home / Self Care | Payer: No Typology Code available for payment source | Source: Ambulatory Visit | Attending: Internal Medicine | Admitting: Internal Medicine

## 2014-03-26 DIAGNOSIS — I10 Essential (primary) hypertension: Secondary | ICD-10-CM

## 2014-03-26 DIAGNOSIS — R809 Proteinuria, unspecified: Secondary | ICD-10-CM

## 2014-03-26 DIAGNOSIS — N183 Chronic kidney disease, stage 3 unspecified: Secondary | ICD-10-CM

## 2014-04-04 ENCOUNTER — Other Ambulatory Visit: Payer: Self-pay | Admitting: Internal Medicine

## 2014-04-08 ENCOUNTER — Telehealth: Payer: Self-pay | Admitting: *Deleted

## 2014-04-21 ENCOUNTER — Other Ambulatory Visit: Payer: Self-pay | Admitting: *Deleted

## 2014-04-21 ENCOUNTER — Ambulatory Visit (INDEPENDENT_AMBULATORY_CARE_PROVIDER_SITE_OTHER): Payer: 59 | Admitting: *Deleted

## 2014-04-21 DIAGNOSIS — I743 Embolism and thrombosis of arteries of the lower extremities: Secondary | ICD-10-CM

## 2014-04-21 DIAGNOSIS — Z7901 Long term (current) use of anticoagulants: Secondary | ICD-10-CM

## 2014-04-21 DIAGNOSIS — Z5181 Encounter for therapeutic drug level monitoring: Secondary | ICD-10-CM

## 2014-04-21 DIAGNOSIS — R809 Proteinuria, unspecified: Secondary | ICD-10-CM

## 2014-04-21 DIAGNOSIS — N183 Chronic kidney disease, stage 3 unspecified: Secondary | ICD-10-CM

## 2014-04-21 DIAGNOSIS — I10 Essential (primary) hypertension: Secondary | ICD-10-CM

## 2014-04-21 LAB — POCT INR: INR: 5.2

## 2014-04-22 ENCOUNTER — Encounter: Payer: Self-pay | Admitting: Internal Medicine

## 2014-04-22 ENCOUNTER — Ambulatory Visit (INDEPENDENT_AMBULATORY_CARE_PROVIDER_SITE_OTHER): Payer: 59 | Admitting: Internal Medicine

## 2014-04-22 DIAGNOSIS — M545 Low back pain, unspecified: Secondary | ICD-10-CM

## 2014-04-22 DIAGNOSIS — M542 Cervicalgia: Secondary | ICD-10-CM

## 2014-04-22 DIAGNOSIS — I10 Essential (primary) hypertension: Secondary | ICD-10-CM

## 2014-04-22 DIAGNOSIS — D6859 Other primary thrombophilia: Secondary | ICD-10-CM

## 2014-04-22 MED ORDER — TRAMADOL HCL 50 MG PO TABS: 50.0000 mg | ORAL_TABLET | Freq: Three times a day (TID) | ORAL | Status: DC | PRN

## 2014-04-22 MED ORDER — HYDROCHLOROTHIAZIDE 12.5 MG PO CAPS: 12.5000 mg | ORAL_CAPSULE | Freq: Every day | ORAL | Status: DC

## 2014-04-22 MED ORDER — LOSARTAN POTASSIUM 100 MG PO TABS: 100.0000 mg | ORAL_TABLET | Freq: Every day | ORAL | Status: DC

## 2014-04-22 MED ORDER — SILDENAFIL CITRATE 20 MG PO TABS: ORAL_TABLET | ORAL | Status: DC
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
## 2014-04-22 NOTE — Progress Notes (Signed)
Pre visit review using our clinic review tool, if applicable. No additional management support is needed unless otherwise documented below in the visit note. 
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
## 2014-04-22 NOTE — Patient Instructions (Addendum)
Use an anti-inflammatory cream such as Aspercreme or Zostrix cream twice a day to the affected area as needed. In lieu of this warm moist compresses or  hot water bottle can be used. Do not apply ice . Alternatively cheaper therapeutically equivalent option for Cialis is available from  [facility] or [place] at [phone].  Minimal Blood Pressure Goal= AVERAGE < 140/90;  Ideal is an AVERAGE < 135/85. This AVERAGE should be calculated from @ least 5-7 BP readings taken @ different times of day on different days of week. You should not respond to isolated BP readings , but rather the AVERAGE for that week .Please bring your  blood pressure cuff to office visits to verify that it is reliable.It  can also be checked against the blood pressure device at the pharmacy. Finger or wrist cuffs are not dependable; an arm cuff is.

## 2014-05-06 ENCOUNTER — Ambulatory Visit (INDEPENDENT_AMBULATORY_CARE_PROVIDER_SITE_OTHER): Payer: 59 | Admitting: *Deleted

## 2014-05-06 DIAGNOSIS — I743 Embolism and thrombosis of arteries of the lower extremities: Secondary | ICD-10-CM

## 2014-05-06 DIAGNOSIS — Z7901 Long term (current) use of anticoagulants: Secondary | ICD-10-CM

## 2014-05-06 DIAGNOSIS — Z5181 Encounter for therapeutic drug level monitoring: Secondary | ICD-10-CM

## 2014-05-06 LAB — POCT INR: INR: 3.5

## 2014-05-27 ENCOUNTER — Ambulatory Visit: Payer: Self-pay | Admitting: Gastroenterology

## 2014-06-03 ENCOUNTER — Ambulatory Visit (INDEPENDENT_AMBULATORY_CARE_PROVIDER_SITE_OTHER): Payer: 59 | Admitting: *Deleted

## 2014-06-03 DIAGNOSIS — Z23 Encounter for immunization: Secondary | ICD-10-CM

## 2014-06-03 DIAGNOSIS — I743 Embolism and thrombosis of arteries of the lower extremities: Secondary | ICD-10-CM

## 2014-06-03 DIAGNOSIS — Z5181 Encounter for therapeutic drug level monitoring: Secondary | ICD-10-CM

## 2014-06-03 LAB — POCT INR: INR: 2.7

## 2014-06-05 ENCOUNTER — Other Ambulatory Visit: Payer: Self-pay | Admitting: Nurse Practitioner

## 2014-07-01 ENCOUNTER — Ambulatory Visit (INDEPENDENT_AMBULATORY_CARE_PROVIDER_SITE_OTHER): Payer: 59

## 2014-07-01 DIAGNOSIS — Z5181 Encounter for therapeutic drug level monitoring: Secondary | ICD-10-CM

## 2014-07-01 LAB — POCT INR: INR: 3.5

## 2014-07-15 ENCOUNTER — Ambulatory Visit (INDEPENDENT_AMBULATORY_CARE_PROVIDER_SITE_OTHER): Payer: 59

## 2014-07-15 ENCOUNTER — Telehealth: Payer: Self-pay | Admitting: Family

## 2014-07-15 DIAGNOSIS — Z5181 Encounter for therapeutic drug level monitoring: Secondary | ICD-10-CM

## 2014-07-15 LAB — POCT INR: INR: 4.5

## 2014-07-15 NOTE — Telephone Encounter (Signed)
Agree with plan - recommend return in one week with multiple high values.

## 2014-07-22 ENCOUNTER — Ambulatory Visit (INDEPENDENT_AMBULATORY_CARE_PROVIDER_SITE_OTHER): Payer: 59

## 2014-07-22 DIAGNOSIS — Z5181 Encounter for therapeutic drug level monitoring: Secondary | ICD-10-CM

## 2014-07-22 LAB — POCT INR: INR: 2.9

## 2014-08-10 ENCOUNTER — Telehealth: Payer: Self-pay | Admitting: Internal Medicine

## 2014-08-10 MED ORDER — WARFARIN SODIUM 5 MG PO TABS: 5.0000 mg | ORAL_TABLET | ORAL | Status: DC

## 2014-08-10 NOTE — Telephone Encounter (Signed)
Requesting refill of warfarin (COUMADIN) 5 MG tablet to [facility] @ [address]

## 2014-08-19 ENCOUNTER — Ambulatory Visit (INDEPENDENT_AMBULATORY_CARE_PROVIDER_SITE_OTHER): Payer: 59 | Admitting: Family Medicine

## 2014-08-19 DIAGNOSIS — Z5181 Encounter for therapeutic drug level monitoring: Secondary | ICD-10-CM

## 2014-08-19 LAB — POCT INR: INR: 3.5

## 2014-09-16 ENCOUNTER — Ambulatory Visit (INDEPENDENT_AMBULATORY_CARE_PROVIDER_SITE_OTHER): Payer: 59 | Admitting: General Practice

## 2014-09-16 DIAGNOSIS — Z5181 Encounter for therapeutic drug level monitoring: Secondary | ICD-10-CM

## 2014-09-16 LAB — POCT INR: INR: 3.1

## 2014-09-16 NOTE — Progress Notes (Signed)
Agree with plan 

## 2014-09-16 NOTE — Progress Notes (Signed)
Pre visit review using our clinic review tool, if applicable. No additional management support is needed unless otherwise documented below in the visit note. 

## 2014-10-07 ENCOUNTER — Ambulatory Visit (INDEPENDENT_AMBULATORY_CARE_PROVIDER_SITE_OTHER): Payer: 59 | Admitting: General Practice

## 2014-10-07 DIAGNOSIS — Z5181 Encounter for therapeutic drug level monitoring: Secondary | ICD-10-CM

## 2014-10-07 LAB — POCT INR: INR: 3.1

## 2014-10-07 NOTE — Progress Notes (Signed)
Pre visit review using our clinic review tool, if applicable. No additional management support is needed unless otherwise documented below in the visit note. 

## 2014-10-07 NOTE — Progress Notes (Signed)
Agree with plan 

## 2014-10-14 ENCOUNTER — Emergency Department (HOSPITAL_COMMUNITY)
Admission: EM | Admit: 2014-10-14 | Discharge: 2014-10-14 | Disposition: A | Discharge status: Home / Self Care | Payer: 59 | Attending: Emergency Medicine | Admitting: Emergency Medicine

## 2014-10-14 ENCOUNTER — Encounter (HOSPITAL_COMMUNITY): Payer: Self-pay

## 2014-10-14 DIAGNOSIS — R110 Nausea: Secondary | ICD-10-CM | POA: Diagnosis not present

## 2014-10-14 DIAGNOSIS — Z8639 Personal history of other endocrine, nutritional and metabolic disease: Secondary | ICD-10-CM | POA: Insufficient documentation

## 2014-10-14 DIAGNOSIS — Z862 Personal history of diseases of the blood and blood-forming organs and certain disorders involving the immune mechanism: Secondary | ICD-10-CM | POA: Insufficient documentation

## 2014-10-14 DIAGNOSIS — I129 Hypertensive chronic kidney disease with stage 1 through stage 4 chronic kidney disease, or unspecified chronic kidney disease: Secondary | ICD-10-CM | POA: Diagnosis not present

## 2014-10-14 DIAGNOSIS — Z7901 Long term (current) use of anticoagulants: Secondary | ICD-10-CM | POA: Insufficient documentation

## 2014-10-14 DIAGNOSIS — N189 Chronic kidney disease, unspecified: Secondary | ICD-10-CM | POA: Insufficient documentation

## 2014-10-14 DIAGNOSIS — Z79899 Other long term (current) drug therapy: Secondary | ICD-10-CM | POA: Insufficient documentation

## 2014-10-14 DIAGNOSIS — Z87891 Personal history of nicotine dependence: Secondary | ICD-10-CM | POA: Diagnosis not present

## 2014-10-14 DIAGNOSIS — H9190 Unspecified hearing loss, unspecified ear: Secondary | ICD-10-CM | POA: Diagnosis not present

## 2014-10-14 DIAGNOSIS — Z8719 Personal history of other diseases of the digestive system: Secondary | ICD-10-CM | POA: Diagnosis not present

## 2014-10-14 DIAGNOSIS — R531 Weakness: Secondary | ICD-10-CM | POA: Diagnosis not present

## 2014-10-14 HISTORY — DX: Acute embolism and thrombosis of unspecified vein: I82.90

## 2014-10-14 LAB — BASIC METABOLIC PANEL WITH GFR
Anion gap: 10 (ref 5–15)
BUN: 19 mg/dL (ref 6–23)
CO2: 28 mmol/L (ref 19–32)
Calcium: 9.2 mg/dL (ref 8.4–10.5)
Chloride: 97 mmol/L (ref 96–112)
Creatinine, Ser: 1.01 mg/dL (ref 0.50–1.35)
GFR calc Af Amer: >90 mL/min (ref >90)
GFR calc non Af Amer: 81 mL/min — ABNORMAL LOW (ref >90)
Glucose, Bld: 113 mg/dL — ABNORMAL HIGH (ref 70–99)
Potassium: 2.9 mmol/L — ABNORMAL LOW (ref 3.5–5.1)
Sodium: 135 mmol/L (ref 135–145)

## 2014-10-14 LAB — CBC WITH DIFFERENTIAL/PLATELET
Basophils Absolute: 0 10*3/uL (ref 0.0–0.1)
Basophils Relative: 1 % (ref 0–1)
Eosinophils Absolute: 0.1 10*3/uL (ref 0.0–0.7)
Eosinophils Relative: 2 % (ref 0–5)
HCT: 44.9 % (ref 39.0–52.0)
HEMOGLOBIN: 15.7 g/dL (ref 13.0–17.0)
Lymphocytes Relative: 30 % (ref 12–46)
Lymphs Abs: 1.5 10*3/uL (ref 0.7–4.0)
MCH: 32 pg (ref 26.0–34.0)
MCHC: 35 g/dL (ref 30.0–36.0)
MCV: 91.4 fL (ref 78.0–100.0)
MONOS PCT: 12 % (ref 3–12)
Monocytes Absolute: 0.6 10*3/uL (ref 0.1–1.0)
Neutro Abs: 2.8 10*3/uL (ref 1.7–7.7)
Neutrophils Relative %: 55 % (ref 43–77)
PLATELETS: 284 10*3/uL (ref 150–400)
RBC: 4.91 MIL/uL (ref 4.22–5.81)
RDW: 13.4 % (ref 11.5–15.5)
WBC: 5 10*3/uL (ref 4.0–10.5)

## 2014-10-14 LAB — I-STAT TROPONIN, ED
TROPONIN I, POC: 0.01 ng/mL (ref 0.00–0.08)
Troponin i, poc: 0.01 ng/mL (ref 0.00–0.08)

## 2014-10-14 MED ORDER — SODIUM CHLORIDE 0.9 % IV BOLUS (SEPSIS): 1000.0000 mL | Freq: Once | INTRAVENOUS | Status: DC

## 2014-10-14 MED ORDER — POTASSIUM CHLORIDE CRYS ER 20 MEQ PO TBCR
40.0000 meq | EXTENDED_RELEASE_TABLET | Freq: Once | ORAL | Status: AC
  Administered 2014-10-14: 40 meq via ORAL
  Filled 2014-10-14: qty 2

## 2014-10-14 MED ORDER — ONDANSETRON HCL 4 MG/2ML IJ SOLN
4.0000 mg | Freq: Once | INTRAMUSCULAR | Status: AC
  Administered 2014-10-14: 4 mg via INTRAVENOUS
  Filled 2014-10-14: qty 2

## 2014-10-14 MED ORDER — KETOROLAC TROMETHAMINE 30 MG/ML IJ SOLN
30.0000 mg | Freq: Once | INTRAMUSCULAR | Status: AC
  Administered 2014-10-14: 30 mg via INTRAVENOUS
  Filled 2014-10-14: qty 1

## 2014-10-14 NOTE — ED Notes (Signed)
Pt stated that when he took his medications yesterday he started feeling weak, pt states he feels "a little bit weak right now".

## 2014-10-14 NOTE — ED Notes (Signed)
Per pt he woke up around 0500 and felt like he was going to pass out, he stated that he also felt nauseated. Pt states that his father took his blood pressure yesterday and that it was high. Pt denies numbness or tingling at this time.

## 2014-10-14 NOTE — ED Notes (Signed)
Pts father states that the last time the pts blood pressure was over 200 was when he was at the doctors office, pts father stated that when he took the pts blood pressure around 11 am yesterday it had dropped to "around 180, so I didn't take it again.

## 2014-10-14 NOTE — Discharge Instructions (Signed)
Follow up with your doctor for further evaluation. Return to the ED with worsening or concerning symptoms.  °

## 2014-10-14 NOTE — ED Provider Notes (Signed)
CSN: [id number]     Arrival date & time 10/14/14  2426 History   First MD Initiated Contact with Patient 10/14/14 [phone]     Chief Complaint  Patient presents with  . Nausea  . Hypertension     (Consider location/radiation/quality/duration/timing/severity/associated sxs/prior Treatment) HPI Comments: Patient is a 59 year old male with a past medical history of hypertension, stage 3 CKD, and antithrombin III deficiency who presents with nausea and lightheadedness that started this morning. Symptoms started when he woke up this morning and resolved after 5-10 minutes. He reports standing from a laying position and feeling as though he was going to pass out and "everything was spinning." He had associated nausea. The symptoms resolved spontaneously. He denies any chest pain or SOB at that time or any other symptoms. No aggravating/alleviaitng factors. Patient reports recently being started on Carvedilol, hydralazine, spironolactone, and HCTZ last week.    Past Medical History  Diagnosis Date  . Crohn's disease     tx. Imuran  . Perianal abscess 2001    s/p right hemicolectomy, and drainage of retroperitoneal abcess 2003  . Hypertension   . Hearing loss     wears hearing aid left ear; birthed with hearing loss  . GI bleed 6/11    w/ normal EGD 6/11, 3 PRBCs   . Anemia     anemia thrombocytopenia, saw Hematology 2011, can not r/o myeloproliferative d/c  . Transfusion history     last 8'13  . Ileostomy in place 04-11-13    04-18-13 ileostomy to be taken down.  Marland Kitchen HOH (hard of hearing) 05/16/2013  . Antithrombin III deficiency 05/16/2013    On coumadin for this  . Chronic kidney disease 05/27/2013    ACUTE RENAL FAILURE   . Hypercalcemia   . Hyperkalemia 05/27/2013  . Blood clot in vein     in right leg   Past Surgical History  Procedure Laterality Date  . Hemicolectomy  08/29/2001    perforated abscess  . Anal fistulotomy  2002  . Partial colectomy  03/11/2012    Procedure: PARTIAL  COLECTOMY;  Surgeon: Edward Jolly, MD;  Location: [facility];  Service: General;  Laterality: N/A;  subtotal colectomy transverse and left colon   . Embolectomy  03/12/2012    Procedure: EMBOLECTOMY;  Surgeon: Angelia Mould, MD;  Location: [facility] OR;  Service: Vascular;  Laterality: Right;  Right popliteal embolectomy with vein patch angioplasty, right posterior tibial embolectomy with vein patch angioplasty   . Intraoperative arteriogram  03/12/2012    Procedure: INTRA OPERATIVE ARTERIOGRAM;  Surgeon: Angelia Mould, MD;  Location: [facility];  Service: Vascular;  Laterality: Right;  . Transmetatarsal amputation  05/10/2012    right  . Ileostomy closure N/A 04/18/2013    Procedure: ILEOSTOMY TAKEDOWN;  Surgeon: Edward Jolly, MD;  Location: [facility];  Service: General;  Laterality: N/A;   Family History  Problem Relation Age of Onset  . Coronary artery disease Neg Hx   . Diabetes Neg Hx   . Colon cancer Neg Hx   . Prostate cancer Neg Hx   . Stroke      GF, aunts   . Hypertension      "the whole family"  . Hypertension Mother   . Hyperlipidemia Mother   . Hypertension Father   . Hyperlipidemia Father   . Heart disease Father   . Kidney failure Neg Hx    History  Substance Use Topics  . Smoking status: Former Smoker --  0.25 packs/day for 9 years    Types: Cigarettes    Quit date: 05/09/2011  . Smokeless tobacco: Never Used  . Alcohol Use: No    Review of Systems  Constitutional: Negative for fever, chills and fatigue.  HENT: Negative for trouble swallowing.   Eyes: Negative for visual disturbance.  Respiratory: Negative for shortness of breath.   Cardiovascular: Negative for chest pain and palpitations.  Gastrointestinal: Positive for nausea. Negative for vomiting, abdominal pain and diarrhea.  Genitourinary: Negative for dysuria and difficulty urinating.  Musculoskeletal: Negative for arthralgias and neck pain.  Skin: Negative for color change.  Neurological:  Positive for light-headedness. Negative for dizziness and weakness.  Psychiatric/Behavioral: Negative for dysphoric mood.      Allergies  Mesalamine  Home Medications   Prior to Admission medications   Medication Sig Start Date End Date Taking? Authorizing Provider  hydrochlorothiazide (MICROZIDE) 12.5 MG capsule Take 1 capsule (12.5 mg total) by mouth daily. 04/22/14   Hendricks Limes, MD  losartan (COZAAR) 100 MG tablet Take 1 tablet (100 mg total) by mouth daily. 04/22/14   Hendricks Limes, MD  Olmesartan-Amlodipine-HCTZ [facility]) 40-10-25 MG TABS Take 1 tablet by mouth daily. 03/13/14   Janith Lima, MD  propranolol (INDERAL XL) 120 MG 24 hr capsule 1 bid 02/24/14   Hendricks Limes, MD  sildenafil (REVATIO) 20 MG tablet 3 qd prn 04/22/14   Hendricks Limes, MD  tadalafil (CIALIS) 5 MG tablet Take 1 tablet (5 mg total) by mouth daily as needed for erectile dysfunction. 06/11/13   Janith Lima, MD  traMADol (ULTRAM) 50 MG tablet Take 1 tablet (50 mg total) by mouth every 8 (eight) hours as needed. 04/22/14   Hendricks Limes, MD  warfarin (COUMADIN) 5 MG tablet Take 1 tablet (5 mg total) by mouth See admin instructions. 08/10/14   Janith Lima, MD   BP 147/97 mmHg  Pulse 77  Temp(Src) 98 F (36.7 C) (Oral)  Resp 18  SpO2 99% Physical Exam  Constitutional: He is oriented to person, place, and time. He appears well-developed and well-nourished. No distress.  HENT:  Head: Normocephalic and atraumatic.  Eyes: Conjunctivae and EOM are normal.  Neck: Normal range of motion.  Cardiovascular: Normal rate and regular rhythm.  Exam reveals no gallop and no friction rub.   No murmur heard. Pulmonary/Chest: Effort normal and breath sounds normal. He has no wheezes. He has no rales. He exhibits no tenderness.  Abdominal: Soft. He exhibits no distension. There is no tenderness.  Musculoskeletal: Normal range of motion.  Neurological: He is alert and oriented to person, place, and time.  Coordination normal.  Speech is goal-oriented. Moves limbs without ataxia.   Skin: Skin is warm and dry.  Psychiatric: He has a normal mood and affect. His behavior is normal.  Nursing note and vitals reviewed.   ED Course  Procedures (including critical care time) Labs Review Labs Reviewed  BASIC METABOLIC PANEL - Abnormal; Notable for the following:    Potassium 2.9 (*)    Glucose, Bld 113 (*)    GFR calc non Af Amer 81 (*)    All other components within normal limits  CBC WITH DIFFERENTIAL/PLATELET  PROTIME-INR  I-STAT TROPOININ, ED  I-STAT TROPOININ, ED    Imaging Review No results found.   EKG Interpretation   Date/Time:  Wednesday October 14 2014 05:36:20 EST Ventricular Rate:  75 PR Interval:  143 QRS Duration: 105 QT Interval:  430 QTC Calculation:  480 R Axis:   -53 Text Interpretation:  Sinus rhythm Left anterior fascicular block Abnrm T,  consider ischemia, anterolateral lds new t wave inversions laterally,  flattened t waves inferiorly compared to prior Confirmed by OTTER  MD,  OLGA ([id number]) on 10/14/2014 6:49:03 AM      MDM   Final diagnoses:  Generalized weakness  Nausea   7:38 AM Labs show K of 2.9 without other acute changes. Patient has some T wave changes in the lateral leads since 2014. Troponin negative. Patient will have repeat troponin in 3 hours. Patient given 40 meq K PO.   9:40 AM Patient's troponin shows no increase. Patient will be discharged with PCP follow up. Patient instructed to return with worsening or concerning symptoms.   [address] [place], PA-C 10/14/14 [place], MD 10/15/14 [phone]

## 2014-10-16 ENCOUNTER — Encounter: Payer: Self-pay | Admitting: Internal Medicine

## 2014-10-16 ENCOUNTER — Other Ambulatory Visit (INDEPENDENT_AMBULATORY_CARE_PROVIDER_SITE_OTHER): Payer: 59

## 2014-10-16 ENCOUNTER — Ambulatory Visit (INDEPENDENT_AMBULATORY_CARE_PROVIDER_SITE_OTHER): Payer: 59 | Admitting: Internal Medicine

## 2014-10-16 VITALS — BP 140/90 | HR 80 | Temp 98.7°F | Resp 16 | Ht 69.0 in | Wt 217.0 lb

## 2014-10-16 DIAGNOSIS — N183 Chronic kidney disease, stage 3 unspecified: Secondary | ICD-10-CM

## 2014-10-16 DIAGNOSIS — R7989 Other specified abnormal findings of blood chemistry: Secondary | ICD-10-CM

## 2014-10-16 DIAGNOSIS — R946 Abnormal results of thyroid function studies: Secondary | ICD-10-CM

## 2014-10-16 DIAGNOSIS — I10 Essential (primary) hypertension: Secondary | ICD-10-CM

## 2014-10-16 DIAGNOSIS — E876 Hypokalemia: Secondary | ICD-10-CM

## 2014-10-16 LAB — URINALYSIS, ROUTINE W REFLEX MICROSCOPIC
Bilirubin Urine: NEGATIVE
Hgb urine dipstick: NEGATIVE
Ketones, ur: NEGATIVE
Leukocytes, UA: NEGATIVE
Nitrite: NEGATIVE
PH: 6 (ref 5.0–8.0)
SPECIFIC GRAVITY, URINE: 1.02 (ref 1.000–1.030)
TOTAL PROTEIN, URINE-UPE24: NEGATIVE
URINE GLUCOSE: NEGATIVE
UROBILINOGEN UA: 0.2 (ref 0.0–1.0)

## 2014-10-16 LAB — T4: T4, Total: 8.2 ug/dL (ref 4.5–12.0)

## 2014-10-16 LAB — BASIC METABOLIC PANEL
BUN: 23 mg/dL (ref 6–23)
CALCIUM: 9.6 mg/dL (ref 8.4–10.5)
CO2: 29 mEq/L (ref 19–32)
Chloride: 94 mEq/L — ABNORMAL LOW (ref 96–112)
Creatinine, Ser: 1.12 mg/dL (ref 0.40–1.50)
GFR: 87.11 mL/min (ref >60.00)
GLUCOSE: 102 mg/dL — AB (ref 70–99)
Potassium: 3.7 mEq/L (ref 3.5–5.1)
Sodium: 131 mEq/L — ABNORMAL LOW (ref 135–145)

## 2014-10-16 LAB — TSH: TSH: 2.05 u[IU]/mL (ref 0.35–4.50)

## 2014-10-16 LAB — T4, FREE: FREE T4: 1.02 ng/dL (ref 0.60–1.60)

## 2014-10-16 LAB — MAGNESIUM: MAGNESIUM: 2.2 mg/dL (ref 1.5–2.5)

## 2014-10-16 LAB — T3, FREE: T3 FREE: 4.2 pg/mL (ref 2.3–4.2)

## 2014-10-16 MED ORDER — POTASSIUM CHLORIDE CRYS ER 20 MEQ PO TBCR
20.0000 meq | EXTENDED_RELEASE_TABLET | Freq: Every day | ORAL | Status: DC
Start: 2014-10-16 — End: 2016-01-18

## 2014-10-16 NOTE — Progress Notes (Signed)
Subjective:    Patient ID: Rick Edwards, male    DOB: [birth year], 59 y.o.   MRN: [id number]  Hypertension This is a chronic problem. The current episode started more than 1 year ago. The problem has been gradually improving since onset. The problem is controlled. Pertinent negatives include no anxiety, blurred vision, chest pain, headaches, malaise/fatigue, neck pain, orthopnea, palpitations, peripheral edema, PND, shortness of breath or sweats. There are no associated agents to hypertension. Past treatments include diuretics, beta blockers and calcium channel blockers. The current treatment provides moderate improvement. Compliance problems include diet and exercise.  Hypertensive end-organ damage includes kidney disease. Identifiable causes of hypertension include chronic renal disease.      Review of Systems  Constitutional: Negative.  Negative for fever, chills, malaise/fatigue, diaphoresis, appetite change and fatigue.  HENT: Negative.   Eyes: Negative.  Negative for blurred vision.  Respiratory: Negative.  Negative for cough, choking, chest tightness, shortness of breath and stridor.   Cardiovascular: Negative.  Negative for chest pain, palpitations, orthopnea, leg swelling and PND.  Gastrointestinal: Negative.  Negative for nausea, vomiting, abdominal pain, diarrhea, constipation and blood in stool.  Endocrine: Negative.   Genitourinary: Negative.  Negative for dysuria, urgency, frequency, hematuria, decreased urine volume, enuresis and difficulty urinating.  Musculoskeletal: Negative.  Negative for back pain, arthralgias and neck pain.  Skin: Negative.  Negative for rash.  Allergic/Immunologic: Negative.   Neurological: Negative.  Negative for dizziness, tremors, seizures, syncope, light-headedness, numbness and headaches.  Hematological: Negative.  Negative for adenopathy. Does not bruise/bleed easily.  Psychiatric/Behavioral: Negative.        Objective:   Physical Exam    Constitutional: He is oriented to person, place, and time. He appears well-developed and well-nourished. No distress.  HENT:  Head: Normocephalic and atraumatic.  Mouth/Throat: Oropharynx is clear and moist. No oropharyngeal exudate.  Eyes: Conjunctivae are normal. Right eye exhibits no discharge. Left eye exhibits no discharge. No scleral icterus.  Neck: Normal range of motion. Neck supple. No JVD present. No tracheal deviation present. No thyromegaly present.  Cardiovascular: Normal rate, regular rhythm, normal heart sounds and intact distal pulses.  Exam reveals no gallop and no friction rub.   No murmur heard. Pulmonary/Chest: Effort normal and breath sounds normal. No stridor. No respiratory distress. He has no wheezes. He has no rales. He exhibits no tenderness.  Abdominal: Soft. Bowel sounds are normal. He exhibits no distension and no mass. There is no tenderness. There is no rebound and no guarding.  Musculoskeletal: Normal range of motion. He exhibits no edema or tenderness.  Lymphadenopathy:    He has no cervical adenopathy.  Neurological: He is oriented to person, place, and time.  Skin: Skin is warm and dry. No rash noted. He is not diaphoretic. No erythema. No pallor.  Vitals reviewed.    Lab Results  Component Value Date   WBC 5.0 10/14/2014   HGB 15.7 10/14/2014   HCT 44.9 10/14/2014   PLT 284 10/14/2014   GLUCOSE 113* 10/14/2014   CHOL 129 03/18/2012   TRIG 186* 03/18/2012   HDL 42.90 09/08/2011   LDLCALC 137* 09/08/2011   ALT 47 05/18/2013   AST 20 05/18/2013   NA 135 10/14/2014   K 2.9* 10/14/2014   CL 97 10/14/2014   CREATININE 1.01 10/14/2014   BUN 19 10/14/2014   CO2 28 10/14/2014   TSH 0.14* 03/13/2014   PSA 3.86 08/15/2012   INR 3.1 10/07/2014       Assessment & Plan:

## 2014-10-16 NOTE — Progress Notes (Signed)
Pre visit review using our clinic review tool, if applicable. No additional management support is needed unless otherwise documented below in the visit note. 

## 2014-10-16 NOTE — Assessment & Plan Note (Signed)
Other than HTN he does not have any s/s of this Will recheck his TFT's today and will consider referral or treatment based on the results

## 2014-10-16 NOTE — Patient Instructions (Signed)

## 2014-10-16 NOTE — Assessment & Plan Note (Signed)
His BP is adequately well controlled Will cont the current meds Will monitor his lytes and renal function today

## 2014-10-16 NOTE — Assessment & Plan Note (Signed)
His renal function has improved Will check his urine today for protein

## 2014-10-16 NOTE — Assessment & Plan Note (Signed)
This is related to his diuretic therapy Will recheck his K+ level today and will start K+ replacement therapy

## 2014-10-20 ENCOUNTER — Encounter: Payer: Self-pay | Admitting: Internal Medicine

## 2014-11-04 ENCOUNTER — Ambulatory Visit: Payer: 59

## 2014-11-17 ENCOUNTER — Ambulatory Visit (INDEPENDENT_AMBULATORY_CARE_PROVIDER_SITE_OTHER): Payer: 59 | Admitting: General Practice

## 2014-11-17 DIAGNOSIS — Z5181 Encounter for therapeutic drug level monitoring: Secondary | ICD-10-CM

## 2014-11-17 LAB — POCT INR: INR: 3.4

## 2014-11-17 NOTE — Progress Notes (Signed)
Pre visit review using our clinic review tool, if applicable. No additional management support is needed unless otherwise documented below in the visit note. 

## 2014-11-17 NOTE — Progress Notes (Signed)
Agree with plan 

## 2014-12-15 ENCOUNTER — Other Ambulatory Visit: Payer: Self-pay

## 2014-12-15 ENCOUNTER — Ambulatory Visit (INDEPENDENT_AMBULATORY_CARE_PROVIDER_SITE_OTHER): Payer: 59 | Admitting: General Practice

## 2014-12-15 DIAGNOSIS — N529 Male erectile dysfunction, unspecified: Secondary | ICD-10-CM

## 2014-12-15 DIAGNOSIS — Z5181 Encounter for therapeutic drug level monitoring: Secondary | ICD-10-CM | POA: Diagnosis not present

## 2014-12-15 DIAGNOSIS — F528 Other sexual dysfunction not due to a substance or known physiological condition: Secondary | ICD-10-CM

## 2014-12-15 LAB — POCT INR: INR: 2.6

## 2014-12-15 MED ORDER — TADALAFIL 5 MG PO TABS: 5.0000 mg | ORAL_TABLET | Freq: Every day | ORAL | Status: DC | PRN

## 2014-12-15 NOTE — Progress Notes (Signed)
Pre visit review using our clinic review tool, if applicable. No additional management support is needed unless otherwise documented below in the visit note. 

## 2014-12-15 NOTE — Progress Notes (Signed)
Agree with plan 

## 2014-12-29 ENCOUNTER — Other Ambulatory Visit: Payer: Self-pay | Admitting: General Practice

## 2014-12-29 ENCOUNTER — Telehealth: Payer: Self-pay

## 2014-12-29 MED ORDER — WARFARIN SODIUM 5 MG PO TABS: ORAL_TABLET | ORAL | Status: DC

## 2014-12-29 NOTE — Telephone Encounter (Signed)
Received refill request from [facility]  request refills for warfarin 5 mg.  Please advise Thanks

## 2015-01-12 ENCOUNTER — Ambulatory Visit (INDEPENDENT_AMBULATORY_CARE_PROVIDER_SITE_OTHER): Payer: 59 | Admitting: General Practice

## 2015-01-12 ENCOUNTER — Other Ambulatory Visit: Payer: Self-pay

## 2015-01-12 DIAGNOSIS — N529 Male erectile dysfunction, unspecified: Secondary | ICD-10-CM

## 2015-01-12 DIAGNOSIS — F528 Other sexual dysfunction not due to a substance or known physiological condition: Secondary | ICD-10-CM

## 2015-01-12 DIAGNOSIS — Z5181 Encounter for therapeutic drug level monitoring: Secondary | ICD-10-CM

## 2015-01-12 LAB — POCT INR: INR: 2.5

## 2015-01-12 MED ORDER — TADALAFIL 5 MG PO TABS: 5.0000 mg | ORAL_TABLET | Freq: Every day | ORAL | Status: DC | PRN

## 2015-01-12 NOTE — Progress Notes (Signed)
I have reviewed and agree with the plan. 

## 2015-01-12 NOTE — Progress Notes (Signed)
Pre visit review using our clinic review tool, if applicable. No additional management support is needed unless otherwise documented below in the visit note. 

## 2015-02-09 ENCOUNTER — Ambulatory Visit (INDEPENDENT_AMBULATORY_CARE_PROVIDER_SITE_OTHER): Payer: 59 | Admitting: General Practice

## 2015-02-09 DIAGNOSIS — Z5181 Encounter for therapeutic drug level monitoring: Secondary | ICD-10-CM | POA: Diagnosis not present

## 2015-02-09 LAB — POCT INR: INR: 2.1

## 2015-02-09 NOTE — Progress Notes (Signed)
I have reviewed and agree with the plan. 

## 2015-02-09 NOTE — Progress Notes (Signed)
Pre visit review using our clinic review tool, if applicable. No additional management support is needed unless otherwise documented below in the visit note. 

## 2015-03-23 ENCOUNTER — Ambulatory Visit (INDEPENDENT_AMBULATORY_CARE_PROVIDER_SITE_OTHER): Payer: 59 | Admitting: General Practice

## 2015-03-23 DIAGNOSIS — Z5181 Encounter for therapeutic drug level monitoring: Secondary | ICD-10-CM | POA: Diagnosis not present

## 2015-03-23 LAB — POCT INR: INR: 3.1

## 2015-03-23 NOTE — Progress Notes (Signed)
I have reviewed and agree with the plan. 

## 2015-03-23 NOTE — Progress Notes (Signed)
Pre visit review using our clinic review tool, if applicable. No additional management support is needed unless otherwise documented below in the visit note. 

## 2015-03-30 ENCOUNTER — Ambulatory Visit: Payer: 59

## 2015-03-30 ENCOUNTER — Encounter: Payer: Self-pay | Admitting: Vascular Surgery

## 2015-03-31 ENCOUNTER — Ambulatory Visit (HOSPITAL_COMMUNITY)
Admission: RE | Admit: 2015-03-31 | Discharge: 2015-03-31 | Disposition: A | Discharge status: Home / Self Care | Payer: 59 | Source: Ambulatory Visit | Attending: Vascular Surgery | Admitting: Vascular Surgery

## 2015-03-31 ENCOUNTER — Encounter: Payer: Self-pay | Admitting: Vascular Surgery

## 2015-03-31 ENCOUNTER — Ambulatory Visit (INDEPENDENT_AMBULATORY_CARE_PROVIDER_SITE_OTHER): Payer: 59 | Admitting: Vascular Surgery

## 2015-03-31 VITALS — BP 138/88 | HR 78 | Temp 98.4°F | Resp 16 | Ht 69.0 in | Wt 206.0 lb

## 2015-03-31 DIAGNOSIS — I10 Essential (primary) hypertension: Secondary | ICD-10-CM | POA: Insufficient documentation

## 2015-03-31 DIAGNOSIS — I739 Peripheral vascular disease, unspecified: Secondary | ICD-10-CM | POA: Diagnosis not present

## 2015-03-31 DIAGNOSIS — Z48812 Encounter for surgical aftercare following surgery on the circulatory system: Secondary | ICD-10-CM | POA: Insufficient documentation

## 2015-03-31 DIAGNOSIS — I70209 Unspecified atherosclerosis of native arteries of extremities, unspecified extremity: Secondary | ICD-10-CM

## 2015-03-31 DIAGNOSIS — Z9862 Peripheral vascular angioplasty status: Secondary | ICD-10-CM | POA: Insufficient documentation

## 2015-03-31 NOTE — Progress Notes (Signed)
[facility] [place] Office Progress Note  CC:  Follow-up peripheral vascular disease Janith Lima, MD  HPI: This is a 59 y.o. male who presents for 18 month follow up. He underwent right popliteal and tibial embolectomy for an ischemic right lower extremity in August 2013 and subsequent right transmetatarsal amputation in September 2013. He was found to have antithrombin III deficiency and was placed on coumadin.   The patient reports no claudication, rest pain or non-healing wounds of the lower extremities. Marland Kitchen He is active and walking regularly. He is a former smoker.   Past Medical History  Diagnosis Date  . Crohn's disease     tx. Imuran  . Perianal abscess 2001    s/p right hemicolectomy, and drainage of retroperitoneal abcess 2003  . Hypertension   . Hearing loss     wears hearing aid left ear; birthed with hearing loss  . GI bleed 6/11    w/ normal EGD 6/11, 3 PRBCs   . Anemia     anemia thrombocytopenia, saw Hematology 2011, can not r/o myeloproliferative d/c  . Transfusion history     last 8'13  . Ileostomy in place 04-11-13    04-18-13 ileostomy to be taken down.  Marland Kitchen HOH (hard of hearing) 05/16/2013  . Antithrombin III deficiency 05/16/2013    On coumadin for this  . Chronic kidney disease 05/27/2013    ACUTE RENAL FAILURE   . Hypercalcemia   . Hyperkalemia 05/27/2013  . Blood clot in vein     in right leg  . Peripheral vascular disease    Past Surgical History  Procedure Laterality Date  . Hemicolectomy  08/29/2001    perforated abscess  . Anal fistulotomy  2002  . Partial colectomy  03/11/2012    Procedure: PARTIAL COLECTOMY;  Surgeon: Edward Jolly, MD;  Location: [facility];  Service: General;  Laterality: N/A;  subtotal colectomy transverse and left colon   . Embolectomy  03/12/2012    Procedure: EMBOLECTOMY;  Surgeon: Angelia Mould, MD;  Location: [facility];  Service: Vascular;  Laterality: Right;  Right popliteal embolectomy with  vein patch angioplasty, right posterior tibial embolectomy with vein patch angioplasty   . Intraoperative arteriogram  03/12/2012    Procedure: INTRA OPERATIVE ARTERIOGRAM;  Surgeon: Angelia Mould, MD;  Location: [facility];  Service: Vascular;  Laterality: Right;  . Transmetatarsal amputation  05/10/2012    right  . Ileostomy closure N/A 04/18/2013    Procedure: ILEOSTOMY TAKEDOWN;  Surgeon: Edward Jolly, MD;  Location: [facility];  Service: General;  Laterality: N/A;    Allergies  Allergen Reactions  . Mesalamine Rash    REACTION: Rash    Current Outpatient Prescriptions  Medication Sig Dispense Refill  . amLODipine (NORVASC) 10 MG tablet Take 10 mg by mouth at bedtime.     . carvedilol (COREG) 25 MG tablet Take 25 mg by mouth 2 (two) times daily.  3  . chlorthalidone (HYGROTON) 25 MG tablet Take 25 mg by mouth every morning.    Marland Kitchen spironolactone (ALDACTONE) 25 MG tablet Take 25 mg by mouth every morning.     . tadalafil (CIALIS) 5 MG tablet Take 1 tablet (5 mg total) by mouth daily as needed for erectile dysfunction. 30 tablet 11  . warfarin (COUMADIN) 5 MG tablet Take as directed by anticoagulation clinic 50 tablet 3  . carvedilol (COREG) 12.5 MG tablet Take 12.5 mg by mouth 2 (two) times daily with a meal.    .  potassium chloride SA (K-DUR,KLOR-CON) 20 MEQ tablet Take 1 tablet (20 mEq total) by mouth daily. (Patient not taking: Reported on 03/31/2015) 90 tablet 1   No current facility-administered medications for this visit.    Family History  Problem Relation Age of Onset  . Coronary artery disease Neg Hx   . Diabetes Neg Hx   . Colon cancer Neg Hx   . Prostate cancer Neg Hx   . Stroke      GF, aunts   . Hypertension      "the whole family"  . Hypertension Mother   . Hyperlipidemia Mother   . Hypertension Father   . Hyperlipidemia Father   . Heart disease Father   . Kidney failure Neg Hx     Social History   Social History  . Marital Status: Single    Spouse  Name: N/A  . Number of Children: N/A  . Years of Education: N/A   Occupational History  . [employer]     Social History Main Topics  . Smoking status: Former Smoker -- 0.25 packs/day for 9 years    Types: Cigarettes    Quit date: 05/09/2011  . Smokeless tobacco: Never Used  . Alcohol Use: No  . Drug Use: No  . Sexual Activity: Not Currently   Other Topics Concern  . Not on file   Social History Narrative   Lives by himself   Regular exercise- no            ROS: [x]  Positive   [ ]  Negative   [ ]  All sytems reviewed and are negative  Cardiovascular: []  chest pain/pressure []  palpitations []  SOB lying flat []  DOE []  pain in legs while walking []  pain in feet when lying flat []  hx of DVT []  hx of phlebitis []  swelling in legs []  varicose veins  Pulmonary: []  productive cough []  asthma []  wheezing  Neurologic: []  weakness in []  arms []  legs []  numbness in []  arms []  legs [] difficulty speaking or slurred speech []  temporary loss of vision in one eye []  dizziness  Hematologic: []  bleeding problems []  problems with blood clotting easily  GI []  vomiting blood []  blood in stool  GU: []  burning with urination []  blood in urine  Psychiatric: []  hx of major depression  Integumentary: []  rashes []  ulcers  Constitutional: []  fever []  chills   PHYSICAL EXAMINATION:  Filed Vitals:   03/31/15 1333  BP: 138/88  Pulse: 78  Temp: 98.4 F (36.9 C)  Resp: 16   Body mass index is 30.41 kg/(m^2).  General:  WDWN in NAD Gait: Not observed HENT: WNL, normocephalic Pulmonary: normal non-labored breathing , without Rales, rhonchi,  wheezing Cardiac: RRR, without  Murmurs, rubs or gallops; without carotid bruits Abdomen: midline abdominal scar  Skin: without rashes, without ulcers  Vascular Exam/Pulses:2+ femoral and popliteal pulses b/l. 2+ posterior tibial pulses b/l. Non palpable dorsalis pedis pulses. Well healed right TMA Extremities: without  ischemic changes, without Gangrene , without cellulitis; without open wounds;  Musculoskeletal: no muscle wasting or atrophy  Neurologic: A&O X 3; Appropriate Affect ; SENSATION: normal; MOTOR FUNCTION:  moving all extremities equally. Speech is fluent/normal   Non-Invasive Vascular Imaging:    ABIs (03/31/2015)  RLE: ABI-1.13. Triphasic posterior tibial waveforms and biphasic anterior tibial waveforms. TBI; n/a  LLE: ABI 1.11. Triphasic posterior tibial waveforms and biphasic anterior tibial waveforms. TBI: 0.90   ASSESSMENT/PLAN :  PVD (peripheral vascular disease) The patient is continuing to do well  s/p right popliteal and tibial embolectomy. He is asymptomatic without complains of rest pain, claudication or nonhealing wounds and is able to ambulate without difficulty. He is on chronic coumadin for antithrombin III deficiency. He continues to be active and walks regularly. He continues to not smoke. Given that he has remained asymptomatic, will stretch next follow-up to two years with ABIs. He knows to return sooner if he develops any symptoms.    Virgina Jock, PA-C [facility] [phone]  Clinic MD:  Pt seen and examined in conjunction with Dr. Scot Dock

## 2015-04-01 NOTE — Addendum Note (Signed)
Addended by: Dorthula Rue L on: 04/01/2015 10:03 AM   Modules accepted: Orders

## 2015-04-01 NOTE — Addendum Note (Signed)
Addended by: Dorthula Rue L on: 04/01/2015 10:57 AM   Modules accepted: Orders

## 2015-04-20 ENCOUNTER — Ambulatory Visit (INDEPENDENT_AMBULATORY_CARE_PROVIDER_SITE_OTHER): Payer: 59 | Admitting: General Practice

## 2015-04-20 DIAGNOSIS — I743 Embolism and thrombosis of arteries of the lower extremities: Secondary | ICD-10-CM | POA: Diagnosis not present

## 2015-04-20 DIAGNOSIS — Z5181 Encounter for therapeutic drug level monitoring: Secondary | ICD-10-CM | POA: Diagnosis not present

## 2015-04-20 LAB — POCT INR: INR: 1.7

## 2015-04-20 NOTE — Progress Notes (Signed)
Pre visit review using our clinic review tool, if applicable. No additional management support is needed unless otherwise documented below in the visit note. 

## 2015-04-20 NOTE — Progress Notes (Signed)
I have reviewed and agree with the plan. 

## 2015-05-13 DIAGNOSIS — H353230 Exudative age-related macular degeneration, bilateral, stage unspecified: Secondary | ICD-10-CM | POA: Diagnosis not present

## 2015-05-13 DIAGNOSIS — H2513 Age-related nuclear cataract, bilateral: Secondary | ICD-10-CM | POA: Diagnosis not present

## 2015-05-13 DIAGNOSIS — H3530 Unspecified macular degeneration: Secondary | ICD-10-CM | POA: Diagnosis not present

## 2015-05-18 ENCOUNTER — Ambulatory Visit (INDEPENDENT_AMBULATORY_CARE_PROVIDER_SITE_OTHER): Payer: Medicare Other | Admitting: General Practice

## 2015-05-18 DIAGNOSIS — Z5181 Encounter for therapeutic drug level monitoring: Secondary | ICD-10-CM

## 2015-05-18 DIAGNOSIS — I743 Embolism and thrombosis of arteries of the lower extremities: Secondary | ICD-10-CM

## 2015-05-18 DIAGNOSIS — Z23 Encounter for immunization: Secondary | ICD-10-CM

## 2015-05-18 LAB — POCT INR: INR: 2.2

## 2015-05-18 NOTE — Progress Notes (Signed)
Pre visit review using our clinic review tool, if applicable. No additional management support is needed unless otherwise documented below in the visit note. 

## 2015-05-18 NOTE — Progress Notes (Signed)
I have reviewed and agree with the plan. 

## 2015-06-11 ENCOUNTER — Other Ambulatory Visit: Payer: Self-pay | Admitting: Internal Medicine

## 2015-06-15 ENCOUNTER — Ambulatory Visit (INDEPENDENT_AMBULATORY_CARE_PROVIDER_SITE_OTHER): Payer: Medicare Other | Admitting: General Practice

## 2015-06-15 ENCOUNTER — Telehealth: Payer: Self-pay | Admitting: Internal Medicine

## 2015-06-15 DIAGNOSIS — I743 Embolism and thrombosis of arteries of the lower extremities: Secondary | ICD-10-CM | POA: Diagnosis not present

## 2015-06-15 DIAGNOSIS — Z5181 Encounter for therapeutic drug level monitoring: Secondary | ICD-10-CM

## 2015-06-15 LAB — POCT INR: INR: 2.7

## 2015-06-15 NOTE — Progress Notes (Signed)
I have reviewed and agree with the plan. 

## 2015-06-15 NOTE — Progress Notes (Signed)
Pre visit review using our clinic review tool, if applicable. No additional management support is needed unless otherwise documented below in the visit note. 

## 2015-06-15 NOTE — Telephone Encounter (Signed)
Pt would like for you to call him at [phone] when you get some samples of Cialis 5 mg from the drug rep.

## 2015-06-29 DIAGNOSIS — I10 Essential (primary) hypertension: Secondary | ICD-10-CM | POA: Diagnosis not present

## 2015-06-29 LAB — BASIC METABOLIC PANEL
BUN: 18 mg/dL (ref 4–21)
CREATININE: 1 mg/dL (ref 0.6–1.3)
GLUCOSE: 108 mg/dL
POTASSIUM: 4 mmol/L (ref 3.4–5.3)
Sodium: 131 mmol/L — AB (ref 137–147)

## 2015-07-13 ENCOUNTER — Ambulatory Visit (INDEPENDENT_AMBULATORY_CARE_PROVIDER_SITE_OTHER): Payer: Medicare Other | Admitting: General Practice

## 2015-07-13 DIAGNOSIS — I743 Embolism and thrombosis of arteries of the lower extremities: Secondary | ICD-10-CM | POA: Diagnosis not present

## 2015-07-13 DIAGNOSIS — Z5181 Encounter for therapeutic drug level monitoring: Secondary | ICD-10-CM

## 2015-07-13 LAB — POCT INR: INR: 3.1

## 2015-07-13 NOTE — Progress Notes (Signed)
Pre visit review using our clinic review tool, if applicable. No additional management support is needed unless otherwise documented below in the visit note. 

## 2015-07-13 NOTE — Progress Notes (Signed)
I have reviewed and agree with the plan. 

## 2015-07-27 ENCOUNTER — Other Ambulatory Visit: Payer: Self-pay | Admitting: Internal Medicine

## 2015-08-10 ENCOUNTER — Ambulatory Visit (INDEPENDENT_AMBULATORY_CARE_PROVIDER_SITE_OTHER): Payer: Medicare Other | Admitting: General Practice

## 2015-08-10 DIAGNOSIS — I743 Embolism and thrombosis of arteries of the lower extremities: Secondary | ICD-10-CM

## 2015-08-10 DIAGNOSIS — Z5181 Encounter for therapeutic drug level monitoring: Secondary | ICD-10-CM | POA: Diagnosis not present

## 2015-08-10 LAB — POCT INR: INR: 1.5

## 2015-08-10 NOTE — Progress Notes (Signed)
I have reviewed and agree with the plan. 

## 2015-08-10 NOTE — Progress Notes (Signed)
Pre visit review using our clinic review tool, if applicable. No additional management support is needed unless otherwise documented below in the visit note. 

## 2015-09-07 ENCOUNTER — Other Ambulatory Visit: Payer: Self-pay | Admitting: Internal Medicine

## 2015-09-10 ENCOUNTER — Ambulatory Visit (INDEPENDENT_AMBULATORY_CARE_PROVIDER_SITE_OTHER): Payer: Medicare Other | Admitting: General Practice

## 2015-09-10 DIAGNOSIS — I743 Embolism and thrombosis of arteries of the lower extremities: Secondary | ICD-10-CM | POA: Diagnosis not present

## 2015-09-10 DIAGNOSIS — Z5181 Encounter for therapeutic drug level monitoring: Secondary | ICD-10-CM | POA: Diagnosis not present

## 2015-09-10 LAB — POCT INR: INR: 2

## 2015-09-10 NOTE — Progress Notes (Signed)
Pre visit review using our clinic review tool, if applicable. No additional management support is needed unless otherwise documented below in the visit note. 

## 2015-09-10 NOTE — Progress Notes (Signed)
I have reviewed and agree with the plan. 

## 2015-09-13 ENCOUNTER — Telehealth: Payer: Self-pay | Admitting: Internal Medicine

## 2015-09-13 NOTE — Telephone Encounter (Signed)
Pt had a coumadin visit on Fri and was inquiring about getting samples of Cialis 5 mg.  Please let me know if/when you get any in and I will call him to pick them up.

## 2015-10-08 ENCOUNTER — Ambulatory Visit (INDEPENDENT_AMBULATORY_CARE_PROVIDER_SITE_OTHER): Payer: Medicare Other | Admitting: General Practice

## 2015-10-08 DIAGNOSIS — I743 Embolism and thrombosis of arteries of the lower extremities: Secondary | ICD-10-CM

## 2015-10-08 DIAGNOSIS — Z5181 Encounter for therapeutic drug level monitoring: Secondary | ICD-10-CM | POA: Diagnosis not present

## 2015-10-08 LAB — POCT INR: INR: 2.5

## 2015-10-08 NOTE — Progress Notes (Signed)
Pre visit review using our clinic review tool, if applicable. No additional management support is needed unless otherwise documented below in the visit note. 

## 2015-10-08 NOTE — Progress Notes (Signed)
I have reviewed and agree with the plan. 

## 2015-11-05 ENCOUNTER — Ambulatory Visit (INDEPENDENT_AMBULATORY_CARE_PROVIDER_SITE_OTHER): Payer: Medicare Other | Admitting: General Practice

## 2015-11-05 DIAGNOSIS — Z5181 Encounter for therapeutic drug level monitoring: Secondary | ICD-10-CM

## 2015-11-05 DIAGNOSIS — I743 Embolism and thrombosis of arteries of the lower extremities: Secondary | ICD-10-CM

## 2015-11-05 LAB — POCT INR: INR: 2.9

## 2015-11-05 NOTE — Progress Notes (Signed)
I have reviewed and agree with the plan. 

## 2015-11-05 NOTE — Progress Notes (Signed)
Pre visit review using our clinic review tool, if applicable. No additional management support is needed unless otherwise documented below in the visit note. 

## 2015-12-02 ENCOUNTER — Ambulatory Visit: Payer: Medicare Other | Admitting: Family Medicine

## 2015-12-02 ENCOUNTER — Encounter: Payer: Self-pay | Admitting: Family Medicine

## 2015-12-02 ENCOUNTER — Ambulatory Visit (INDEPENDENT_AMBULATORY_CARE_PROVIDER_SITE_OTHER): Payer: Medicare Other | Admitting: Family Medicine

## 2015-12-02 VITALS — BP 128/87 | HR 71 | Ht 69.0 in | Wt 200.0 lb

## 2015-12-02 DIAGNOSIS — M25561 Pain in right knee: Secondary | ICD-10-CM | POA: Diagnosis not present

## 2015-12-02 DIAGNOSIS — I743 Embolism and thrombosis of arteries of the lower extremities: Secondary | ICD-10-CM

## 2015-12-02 DIAGNOSIS — M1711 Unilateral primary osteoarthritis, right knee: Secondary | ICD-10-CM | POA: Diagnosis not present

## 2015-12-02 MED ORDER — METHYLPREDNISOLONE ACETATE 40 MG/ML IJ SUSP
40.0000 mg | Freq: Once | INTRAMUSCULAR | Status: AC
  Administered 2015-12-02: 40 mg via INTRA_ARTICULAR

## 2015-12-02 NOTE — Patient Instructions (Signed)
Your knee pain is due to arthritis. These are the different classes of medicine you can take for this: Take tylenol 500mg  1-2 tabs three times a day for pain. I would avoid ibuprofen or aleve if you're on coumadin. Glucosamine sulfate 750mg  twice a day is a supplement that may help. Capsaicin, aspercreme, or biofreeze topically up to four times a day may also help with pain. Cortisone injections are an option - you were given this today. If cortisone injections do not help, there are different types of shots that may help but they take longer to take effect (the gel shots). It's important that you continue to stay active. Straight leg raises, knee extensions 3 sets of 10 once a day (add ankle weight if these become too easy). Consider physical therapy to strengthen muscles around the joint that hurts to take pressure off of the joint itself. Shoe inserts with good arch support may be helpful. Walker or cane if needed. Heat or ice 15 minutes at a time 3-4 times a day as needed to help with pain. Water aerobics and cycling with low resistance are the best two types of exercise for arthritis. Follow up with me in 1 month or as needed.

## 2015-12-03 NOTE — Assessment & Plan Note (Signed)
Current pain consistent with flare of DJD.  Discussed tylenol, glucosamine, topical medication.  Intraarticular injection given today.  Shown home exercises to do daily.  Heat/ice.  F/u in 1 month or as needed.  Consider physical therapy, viscosupplementation if not improving.  After informed written consent, patient was seated on exam table. Right knee was prepped with alcohol swab and utilizing anteromedial approach, patient's right knee was injected intraarticularly with 3:1 marcaine: depomedrol. Patient tolerated the procedure well without immediate complications.

## 2015-12-03 NOTE — Progress Notes (Signed)
PCP: Scarlette Calico, MD  Subjective:   HPI: Patient is a 60 y.o. male here for right knee pain.  Patient reports he's had constant right knee pain for past 3 weeks. Has known DJD. Recalls having a cortisone injection about 10 years ago that seemed to last about 8 years. Then another one 2 years ago helped. Has had off and on pain since then until past 3 weeks. Pain level is 0/10 at rest, up to 8/10 and sharp with walking. Pain is anterior, medial. No skin changes, numbness.  Past Medical History  Diagnosis Date  . Crohn's disease ([place])     tx. Imuran  . Perianal abscess 2001    s/p right hemicolectomy, and drainage of retroperitoneal abcess 2003  . Hypertension   . Hearing loss     wears hearing aid left ear; birthed with hearing loss  . GI bleed 6/11    w/ normal EGD 6/11, 3 PRBCs   . Anemia     anemia thrombocytopenia, saw Hematology 2011, can not r/o myeloproliferative d/c  . Transfusion history     last 8'13  . Ileostomy in place [facility]) 04-11-13    04-18-13 ileostomy to be taken down.  Marland Kitchen HOH (hard of hearing) 05/16/2013  . Antithrombin III deficiency ([place]) 05/16/2013    On coumadin for this  . Chronic kidney disease 05/27/2013    ACUTE RENAL FAILURE   . Hypercalcemia   . Hyperkalemia 05/27/2013  . Blood clot in vein     in right leg  . Peripheral vascular disease [facility])     Current Outpatient Prescriptions on File Prior to Visit  Medication Sig Dispense Refill  . amLODipine (NORVASC) 10 MG tablet Take 10 mg by mouth at bedtime.     . carvedilol (COREG) 12.5 MG tablet Take 12.5 mg by mouth 2 (two) times daily with a meal.    . carvedilol (COREG) 25 MG tablet Take 25 mg by mouth 2 (two) times daily.  3  . chlorthalidone (HYGROTON) 25 MG tablet Take 25 mg by mouth every morning.    . potassium chloride SA (K-DUR,KLOR-CON) 20 MEQ tablet Take 1 tablet (20 mEq total) by mouth daily. (Patient not taking: Reported on 03/31/2015) 90 tablet 1  . spironolactone (ALDACTONE) 25 MG  tablet Take 25 mg by mouth every morning.     . tadalafil (CIALIS) 5 MG tablet Take 1 tablet (5 mg total) by mouth daily as needed for erectile dysfunction. 30 tablet 11  . warfarin (COUMADIN) 5 MG tablet TAKE AS DIRECTED BY  ANTICOAGULATION  CLINIC 40 tablet 3   No current facility-administered medications on file prior to visit.    Past Surgical History  Procedure Laterality Date  . Hemicolectomy  08/29/2001    perforated abscess  . Anal fistulotomy  2002  . Partial colectomy  03/11/2012    Procedure: PARTIAL COLECTOMY;  Surgeon: Edward Jolly, MD;  Location: [facility];  Service: General;  Laterality: N/A;  subtotal colectomy transverse and left colon   . Embolectomy  03/12/2012    Procedure: EMBOLECTOMY;  Surgeon: Angelia Mould, MD;  Location: [facility];  Service: Vascular;  Laterality: Right;  Right popliteal embolectomy with vein patch angioplasty, right posterior tibial embolectomy with vein patch angioplasty   . Intraoperative arteriogram  03/12/2012    Procedure: INTRA OPERATIVE ARTERIOGRAM;  Surgeon: Angelia Mould, MD;  Location: [place];  Service: Vascular;  Laterality: Right;  . Transmetatarsal amputation  05/10/2012    right  .  Ileostomy closure N/A 04/18/2013    Procedure: ILEOSTOMY TAKEDOWN;  Surgeon: Edward Jolly, MD;  [facility];  Service: General;  Laterality: N/A;    Allergies  Allergen Reactions  . Mesalamine Rash    REACTION: Rash    Social History   Social History  . Marital Status: Single    Spouse Name: N/A  . Number of Children: N/A  . Years of Education: N/A   Occupational History  . [employer] office     Social History Main Topics  . Smoking status: Former Smoker -- 0.25 packs/day for 9 years    Types: Cigarettes    Quit date: 05/09/2011  . Smokeless tobacco: Never Used  . Alcohol Use: No  . Drug Use: No  . Sexual Activity: Not Currently   Other Topics Concern  . Not on file   Social History Narrative   Lives by himself    Regular exercise- no           Family History  Problem Relation Age of Onset  . Coronary artery disease Neg Hx   . Diabetes Neg Hx   . Colon cancer Neg Hx   . Prostate cancer Neg Hx   . Stroke      GF, aunts   . Hypertension      "the whole family"  . Hypertension Mother   . Hyperlipidemia Mother   . Hypertension Father   . Hyperlipidemia Father   . Heart disease Father   . Kidney failure Neg Hx     BP 128/87 mmHg  Pulse 71  Ht 5\' 9"  (1.753 m)  Wt 200 lb (90.719 kg)  BMI 29.52 kg/m2  Review of Systems: See HPI above.    Objective:  Physical Exam:  Gen: NAD, comfortable in exam room  Right knee: No gross deformity, ecchymoses, effusion. TTP medial joint line, post patellar facet.  No other tenderness. FROM. Negative ant/post drawers. Negative valgus/varus testing. Negative lachmanns. Negative mcmurrays, apleys, patellar apprehension. NV intact distally.  Left knee: FROM without pain.    Assessment & Plan:  1. Right knee pain - consistent with flare of DJD>  Discussed tylenol, glucosamine, topical medication.  Intraarticular injection given today.  Shown home exercises to do daily.  Heat/ice.  F/u in 1 month or as needed.  Consider physical therapy, viscosupplementation if not improving.  After informed written consent, patient was seated on exam table. Right knee was prepped with alcohol swab and utilizing anteromedial approach, patient's right knee was injected intraarticularly with 3:1 marcaine: depomedrol. Patient tolerated the procedure well without immediate complications.

## 2015-12-13 ENCOUNTER — Telehealth: Payer: Self-pay | Admitting: Family Medicine

## 2015-12-17 ENCOUNTER — Ambulatory Visit (INDEPENDENT_AMBULATORY_CARE_PROVIDER_SITE_OTHER): Payer: Medicare Other | Admitting: General Practice

## 2015-12-17 DIAGNOSIS — I743 Embolism and thrombosis of arteries of the lower extremities: Secondary | ICD-10-CM

## 2015-12-17 DIAGNOSIS — Z5181 Encounter for therapeutic drug level monitoring: Secondary | ICD-10-CM | POA: Diagnosis not present

## 2015-12-17 LAB — POCT INR: INR: 2.4

## 2015-12-17 NOTE — Progress Notes (Signed)
Pre visit review using our clinic review tool, if applicable. No additional management support is needed unless otherwise documented below in the visit note. 

## 2015-12-17 NOTE — Progress Notes (Signed)
I have reviewed and agree with the plan. 

## 2015-12-20 NOTE — Telephone Encounter (Signed)
If he still hasn't noted improvement I'd recommend one of the following: physical therapy or doing x-rays (assuming he hasn't had them in the past year).  Let me know which he would like to do.  We would consider ordering the gel shots if the x-rays confirm his arthritis.

## 2015-12-23 ENCOUNTER — Ambulatory Visit: Payer: Medicare Other | Admitting: Family Medicine

## 2015-12-23 NOTE — Telephone Encounter (Signed)
Patient coming in for appointment

## 2015-12-29 ENCOUNTER — Ambulatory Visit (INDEPENDENT_AMBULATORY_CARE_PROVIDER_SITE_OTHER): Payer: Medicare Other | Admitting: Family Medicine

## 2015-12-29 ENCOUNTER — Encounter: Payer: Self-pay | Admitting: Family Medicine

## 2015-12-29 VITALS — BP 116/76 | HR 80 | Ht 69.0 in | Wt 200.0 lb

## 2015-12-29 DIAGNOSIS — I743 Embolism and thrombosis of arteries of the lower extremities: Secondary | ICD-10-CM

## 2015-12-29 DIAGNOSIS — M25561 Pain in right knee: Secondary | ICD-10-CM

## 2015-12-29 DIAGNOSIS — M1711 Unilateral primary osteoarthritis, right knee: Secondary | ICD-10-CM

## 2015-12-29 NOTE — Patient Instructions (Signed)
Your current issue is more of a soft tissue impingement on the lateral aspect of your knee. This typically responds to physical therapy and a knee sleeve. I'd wear the sleeve when up and walking around. The arthritis portion of your knee pain has improved. Follow up with me in 1 month for reevaluation. Icing as needed. Usually medicines won't help much with what you're dealing with now.

## 2015-12-30 NOTE — Progress Notes (Signed)
PCP: Scarlette Calico, MD  Subjective:   HPI: Patient is a 60 y.o. male here for right knee pain.  4/27: Patient reports he's had constant right knee pain for past 3 weeks. Has known DJD. Recalls having a cortisone injection about 10 years ago that seemed to last about 8 years. Then another one 2 years ago helped. Has had off and on pain since then until past 3 weeks. Pain level is 0/10 at rest, up to 8/10 and sharp with walking. Pain is anterior, medial. No skin changes, numbness.  5/24: Patient reports improvement following injection. Pain level now is more anterior, 7/10 and sharp. Associated popping when walking outside and anterolateral pain. Worse with walking. Better with rest. No skin changes, numbness.  Past Medical History  Diagnosis Date  . Crohn's disease ([place])     tx. Imuran  . Perianal abscess 2001    s/p right hemicolectomy, and drainage of retroperitoneal abcess 2003  . Hypertension   . Hearing loss     wears hearing aid left ear; birthed with hearing loss  . GI bleed 6/11    w/ normal EGD 6/11, 3 PRBCs   . Anemia     anemia thrombocytopenia, saw Hematology 2011, can not r/o myeloproliferative d/c  . Transfusion history     last 8'13  . Ileostomy in place [facility]) 04-11-13    04-18-13 ileostomy to be taken down.  Marland Kitchen HOH (hard of hearing) 05/16/2013  . Antithrombin III deficiency ([place]) 05/16/2013    On coumadin for this  . Chronic kidney disease 05/27/2013    ACUTE RENAL FAILURE   . Hypercalcemia   . Hyperkalemia 05/27/2013  . Blood clot in vein     in right leg  . Peripheral vascular disease [facility])     Current Outpatient Prescriptions on File Prior to Visit  Medication Sig Dispense Refill  . amLODipine (NORVASC) 10 MG tablet Take 10 mg by mouth at bedtime.     . carvedilol (COREG) 12.5 MG tablet Take 12.5 mg by mouth 2 (two) times daily with a meal.    . carvedilol (COREG) 25 MG tablet Take 25 mg by mouth 2 (two) times daily.  3  . chlorthalidone  (HYGROTON) 25 MG tablet Take 25 mg by mouth every morning.    . potassium chloride SA (K-DUR,KLOR-CON) 20 MEQ tablet Take 1 tablet (20 mEq total) by mouth daily. (Patient not taking: Reported on 03/31/2015) 90 tablet 1  . spironolactone (ALDACTONE) 25 MG tablet Take 25 mg by mouth every morning.     . tadalafil (CIALIS) 5 MG tablet Take 1 tablet (5 mg total) by mouth daily as needed for erectile dysfunction. 30 tablet 11  . warfarin (COUMADIN) 5 MG tablet TAKE AS DIRECTED BY  ANTICOAGULATION  CLINIC 40 tablet 3   No current facility-administered medications on file prior to visit.    Past Surgical History  Procedure Laterality Date  . Hemicolectomy  08/29/2001    perforated abscess  . Anal fistulotomy  2002  . Partial colectomy  03/11/2012    Procedure: PARTIAL COLECTOMY;  Surgeon: Edward Jolly, MD;  [facility];  Service: General;  Laterality: N/A;  subtotal colectomy transverse and left colon   . Embolectomy  03/12/2012    Procedure: EMBOLECTOMY;  Surgeon: Angelia Mould, MD;  Location: [facility] OR;  Service: Vascular;  Laterality: Right;  Right popliteal embolectomy with vein patch angioplasty, right posterior tibial embolectomy with vein patch angioplasty   . Intraoperative arteriogram  03/12/2012  Procedure: INTRA OPERATIVE ARTERIOGRAM;  Surgeon: Angelia Mould, MD;  Location: [place];  Service: Vascular;  Laterality: Right;  . Transmetatarsal amputation  05/10/2012    right  . Ileostomy closure N/A 04/18/2013    Procedure: ILEOSTOMY TAKEDOWN;  Surgeon: Edward Jolly, MD;  [facility];  Service: General;  Laterality: N/A;    Allergies  Allergen Reactions  . Mesalamine Rash    REACTION: Rash    Social History   Social History  . Marital Status: Single    Spouse Name: N/A  . Number of Children: N/A  . Years of Education: N/A   Occupational History  . Post office     Social History Main Topics  . Smoking status: Former Smoker -- 0.25 packs/day for  9 years    Types: Cigarettes    Quit date: 05/09/2011  . Smokeless tobacco: Never Used  . Alcohol Use: No  . Drug Use: No  . Sexual Activity: Not Currently   Other Topics Concern  . Not on file   Social History Narrative   Lives by himself   Regular exercise- no           Family History  Problem Relation Age of Onset  . Coronary artery disease Neg Hx   . Diabetes Neg Hx   . Colon cancer Neg Hx   . Prostate cancer Neg Hx   . Stroke      GF, aunts   . Hypertension      "the whole family"  . Hypertension Mother   . Hyperlipidemia Mother   . Hypertension Father   . Hyperlipidemia Father   . Heart disease Father   . Kidney failure Neg Hx     BP 116/76 mmHg  Pulse 80  Ht 5\' 9"  (1.753 m)  Wt 200 lb (90.719 kg)  BMI 29.52 kg/m2  Review of Systems: See HPI above.    Objective:  Physical Exam:  Gen: NAD, comfortable in exam room  Right knee: No gross deformity, ecchymoses, effusion. On flexion to extension can feel multiple plicae about knee - medial and lateral about patella.  One also visualized on ultrasound popping laterally over proximal tibia. Mild tenderness of the plica lateral proximal tibia.  No other tenderness currently including joint lines. FROM. Negative ant/post drawers. Negative valgus/varus testing. Negative lachmanns. Negative mcmurrays, apleys, patellar apprehension. NV intact distally.  Left knee: FROM without pain.    Assessment & Plan:  1. Right knee pain - underlying DJD which has improved.  Can feel and visualize plicae about knee though currently on the lateral one is symptomatic.  Start with sleeve, physical therapy.  Icing as needed.  Tylenol if needed.  F/u in 1 month for reevaluation.

## 2015-12-30 NOTE — Assessment & Plan Note (Signed)
underlying DJD which has improved.  Can feel and visualize plicae about knee though currently on the lateral one is symptomatic.  Start with sleeve, physical therapy.  Icing as needed.  Tylenol if needed.  F/u in 1 month for reevaluation.

## 2016-01-15 ENCOUNTER — Other Ambulatory Visit: Payer: Self-pay | Admitting: Internal Medicine

## 2016-01-17 ENCOUNTER — Other Ambulatory Visit: Payer: Self-pay | Admitting: General Practice

## 2016-01-17 MED ORDER — WARFARIN SODIUM 5 MG PO TABS: ORAL_TABLET | ORAL | Status: DC

## 2016-01-18 ENCOUNTER — Ambulatory Visit (INDEPENDENT_AMBULATORY_CARE_PROVIDER_SITE_OTHER): Payer: Medicare Other | Admitting: Internal Medicine

## 2016-01-18 ENCOUNTER — Encounter: Payer: Self-pay | Admitting: Internal Medicine

## 2016-01-18 ENCOUNTER — Other Ambulatory Visit (INDEPENDENT_AMBULATORY_CARE_PROVIDER_SITE_OTHER): Payer: Medicare Other

## 2016-01-18 VITALS — BP 110/80 | HR 71 | Temp 98.1°F | Resp 16 | Ht 69.0 in | Wt 202.0 lb

## 2016-01-18 DIAGNOSIS — R739 Hyperglycemia, unspecified: Secondary | ICD-10-CM | POA: Diagnosis not present

## 2016-01-18 DIAGNOSIS — I739 Peripheral vascular disease, unspecified: Secondary | ICD-10-CM

## 2016-01-18 DIAGNOSIS — R972 Elevated prostate specific antigen [PSA]: Secondary | ICD-10-CM | POA: Diagnosis not present

## 2016-01-18 DIAGNOSIS — E876 Hypokalemia: Secondary | ICD-10-CM

## 2016-01-18 DIAGNOSIS — I10 Essential (primary) hypertension: Secondary | ICD-10-CM

## 2016-01-18 DIAGNOSIS — Z0000 Encounter for general adult medical examination without abnormal findings: Secondary | ICD-10-CM

## 2016-01-18 DIAGNOSIS — Z23 Encounter for immunization: Secondary | ICD-10-CM | POA: Diagnosis not present

## 2016-01-18 DIAGNOSIS — N183 Chronic kidney disease, stage 3 unspecified: Secondary | ICD-10-CM

## 2016-01-18 DIAGNOSIS — Z1159 Encounter for screening for other viral diseases: Secondary | ICD-10-CM | POA: Diagnosis not present

## 2016-01-18 DIAGNOSIS — E781 Pure hyperglyceridemia: Secondary | ICD-10-CM

## 2016-01-18 DIAGNOSIS — E785 Hyperlipidemia, unspecified: Secondary | ICD-10-CM

## 2016-01-18 DIAGNOSIS — I743 Embolism and thrombosis of arteries of the lower extremities: Secondary | ICD-10-CM

## 2016-01-18 LAB — HEMOGLOBIN A1C: Hgb A1c MFr Bld: 5.6 % (ref 4.6–6.5)

## 2016-01-18 LAB — URINALYSIS, ROUTINE W REFLEX MICROSCOPIC
BILIRUBIN URINE: NEGATIVE
Hgb urine dipstick: NEGATIVE
Ketones, ur: NEGATIVE
Leukocytes, UA: NEGATIVE
Nitrite: NEGATIVE
PH: 7 (ref 5.0–8.0)
SPECIFIC GRAVITY, URINE: 1.01 (ref 1.000–1.030)
Total Protein, Urine: NEGATIVE
UROBILINOGEN UA: 0.2 (ref 0.0–1.0)
Urine Glucose: NEGATIVE
WBC UA: NONE SEEN (ref 0–?)

## 2016-01-18 LAB — COMPREHENSIVE METABOLIC PANEL
ALK PHOS: 64 U/L (ref 39–117)
ALT: 18 U/L (ref 0–53)
AST: 12 U/L (ref 0–37)
Albumin: 4.4 g/dL (ref 3.5–5.2)
BILIRUBIN TOTAL: 0.7 mg/dL (ref 0.2–1.2)
BUN: 20 mg/dL (ref 6–23)
CALCIUM: 9.6 mg/dL (ref 8.4–10.5)
CO2: 27 mEq/L (ref 19–32)
Chloride: 95 mEq/L — ABNORMAL LOW (ref 96–112)
Creatinine, Ser: 1.02 mg/dL (ref 0.40–1.50)
GFR: 96.61 mL/min (ref >60.00)
GLUCOSE: 106 mg/dL — AB (ref 70–99)
POTASSIUM: 3.8 meq/L (ref 3.5–5.1)
Sodium: 132 mEq/L — ABNORMAL LOW (ref 135–145)
TOTAL PROTEIN: 7.4 g/dL (ref 6.0–8.3)

## 2016-01-18 LAB — CBC WITH DIFFERENTIAL/PLATELET
BASOS ABS: 0.1 10*3/uL (ref 0.0–0.1)
Basophils Relative: 0.8 % (ref 0.0–3.0)
EOS ABS: 0.1 10*3/uL (ref 0.0–0.7)
Eosinophils Relative: 1.9 % (ref 0.0–5.0)
HCT: 44 % (ref 39.0–52.0)
HEMOGLOBIN: 15.2 g/dL (ref 13.0–17.0)
Lymphocytes Relative: 22.3 % (ref 12.0–46.0)
Lymphs Abs: 1.7 10*3/uL (ref 0.7–4.0)
MCHC: 34.5 g/dL (ref 30.0–36.0)
MCV: 91.1 fl (ref 78.0–100.0)
MONO ABS: 0.8 10*3/uL (ref 0.1–1.0)
Monocytes Relative: 11 % (ref 3.0–12.0)
NEUTROS PCT: 64 % (ref 43.0–77.0)
Neutro Abs: 4.9 10*3/uL (ref 1.4–7.7)
PLATELETS: 423 10*3/uL — AB (ref 150.0–400.0)
RBC: 4.83 Mil/uL (ref 4.22–5.81)
RDW: 13.4 % (ref 11.5–15.5)
WBC: 7.7 10*3/uL (ref 4.0–10.5)

## 2016-01-18 LAB — FECAL OCCULT BLOOD, GUAIAC: FECAL OCCULT BLD: NEGATIVE

## 2016-01-18 LAB — HEPATITIS C ANTIBODY: HCV AB: NEGATIVE

## 2016-01-18 LAB — LDL CHOLESTEROL, DIRECT: LDL DIRECT: 112 mg/dL

## 2016-01-18 LAB — LIPID PANEL
CHOL/HDL RATIO: 5
CHOLESTEROL: 250 mg/dL — AB (ref 0–200)
HDL: 45.6 mg/dL (ref >39.00)
Triglycerides: 509 mg/dL — ABNORMAL HIGH (ref 0.0–149.0)

## 2016-01-18 LAB — PSA: PSA: 2.62 ng/mL (ref 0.10–4.00)

## 2016-01-18 LAB — TSH: TSH: 1.49 u[IU]/mL (ref 0.35–4.50)

## 2016-01-18 MED ORDER — POTASSIUM CHLORIDE CRYS ER 20 MEQ PO TBCR: 20.0000 meq | EXTENDED_RELEASE_TABLET | Freq: Every day | ORAL | Status: DC

## 2016-01-18 MED ORDER — OMEGA-3-ACID ETHYL ESTERS 1 G PO CAPS: 2.0000 g | ORAL_CAPSULE | Freq: Two times a day (BID) | ORAL | Status: DC

## 2016-01-18 MED ORDER — CARVEDILOL 25 MG PO TABS: 25.0000 mg | ORAL_TABLET | Freq: Two times a day (BID) | ORAL | Status: DC

## 2016-01-18 NOTE — Progress Notes (Signed)
Subjective:  Patient ID: Rick Edwards, male    DOB: [birth year]  Age: 60 y.o. MRN: [id number]  CC: Annual Exam   HPI [name] presents for a CPX.   He tells me that his blood pressure has been well controlled with a combination of chlorthalidone, spironolactone, carvedilol and amlodipine. He has had no recent episodes of headache/blurred vision/chest pain/palpitations/dyspnea on exertion/near-syncope/or fatigue.  Past Medical History  Diagnosis Date  . Crohn's disease ([place])     tx. Imuran  . Perianal abscess 2001    s/p right hemicolectomy, and drainage of retroperitoneal abcess 2003  . Hypertension   . Hearing loss     wears hearing aid left ear; birthed with hearing loss  . GI bleed 6/11    w/ normal EGD 6/11, 3 PRBCs   . Anemia     anemia thrombocytopenia, saw Hematology 2011, can not r/o myeloproliferative d/c  . Transfusion history     last 8'13  . Ileostomy in place [facility]) 04-11-13    04-18-13 ileostomy to be taken down.  Marland Kitchen HOH (hard of hearing) 05/16/2013  . Antithrombin III deficiency ([place]) 05/16/2013    On coumadin for this  . Chronic kidney disease 05/27/2013    ACUTE RENAL FAILURE   . Hypercalcemia   . Hyperkalemia 05/27/2013  . Blood clot in vein     in right leg  . Peripheral vascular disease [facility])    Past Surgical History  Procedure Laterality Date  . Hemicolectomy  08/29/2001    perforated abscess  . Anal fistulotomy  2002  . Partial colectomy  03/11/2012    Procedure: PARTIAL COLECTOMY;  Surgeon: Edward Jolly, MD;  [facility];  Service: General;  Laterality: N/A;  subtotal colectomy transverse and left colon   . Embolectomy  03/12/2012    Procedure: EMBOLECTOMY;  Surgeon: Angelia Mould, MD;  Location: [facility] OR;  Service: Vascular;  Laterality: Right;  Right popliteal embolectomy with vein patch angioplasty, right posterior tibial embolectomy with vein patch angioplasty   . Intraoperative arteriogram  03/12/2012    Procedure:  INTRA OPERATIVE ARTERIOGRAM;  Surgeon: Angelia Mould, MD;  Location: [place];  Service: Vascular;  Laterality: Right;  . Transmetatarsal amputation  05/10/2012    right  . Ileostomy closure N/A 04/18/2013    Procedure: ILEOSTOMY TAKEDOWN;  Surgeon: Edward Jolly, MD;  [facility];  Service: General;  Laterality: N/A;    reports that he quit smoking about 4 years ago. His smoking use included Cigarettes. He has a 2.25 pack-year smoking history. He has never used smokeless tobacco. He reports that he uses illicit drugs (Marijuana). He reports that he does not drink alcohol. family history includes Heart disease in his father; Hyperlipidemia in his father and mother; Hypertension in his father and mother. There is no history of Coronary artery disease, Diabetes, Colon cancer, Prostate cancer, or Kidney failure. Allergies  Allergen Reactions  . Mesalamine Rash    REACTION: Rash    Outpatient Prescriptions Prior to Visit  Medication Sig Dispense Refill  . amLODipine (NORVASC) 10 MG tablet Take 10 mg by mouth at bedtime.     . carvedilol (COREG) 12.5 MG tablet Take 12.5 mg by mouth 2 (two) times daily with a meal.    . chlorthalidone (HYGROTON) 25 MG tablet Take 25 mg by mouth every morning.    Marland Kitchen spironolactone (ALDACTONE) 25 MG tablet Take 25 mg by mouth every morning.     . tadalafil (CIALIS)  5 MG tablet Take 1 tablet (5 mg total) by mouth daily as needed for erectile dysfunction. 30 tablet 11  . warfarin (COUMADIN) 5 MG tablet TAKE AS DIRECTED BY  ANTICOAGULATION  CLINIC 40 tablet 3  . carvedilol (COREG) 25 MG tablet Take 25 mg by mouth 2 (two) times daily.  3  . potassium chloride SA (K-DUR,KLOR-CON) 20 MEQ tablet Take 1 tablet (20 mEq total) by mouth daily. (Patient not taking: Reported on 03/31/2015) 90 tablet 1   No facility-administered medications prior to visit.    ROS Review of Systems  Constitutional: Negative.  Negative for fever, chills, diaphoresis, appetite  change and fatigue.  HENT: Negative.  Negative for sinus pressure and trouble swallowing.   Eyes: Negative.  Negative for visual disturbance.  Respiratory: Negative.  Negative for cough, choking, chest tightness, shortness of breath and stridor.   Cardiovascular: Negative.  Negative for chest pain, palpitations and leg swelling.  Gastrointestinal: Negative.  Negative for nausea, vomiting, abdominal pain, diarrhea, constipation and blood in stool.  Endocrine: Negative.   Genitourinary: Negative.  Negative for dysuria, urgency, hematuria, flank pain, penile swelling, scrotal swelling, enuresis, difficulty urinating and testicular pain.  Musculoskeletal: Negative.  Negative for myalgias, back pain, joint swelling, arthralgias and neck pain.  Skin: Negative.  Negative for color change and rash.  Allergic/Immunologic: Negative.   Neurological: Negative.  Negative for dizziness, tremors, weakness, light-headedness and numbness.  Hematological: Negative.  Negative for adenopathy. Does not bruise/bleed easily.  Psychiatric/Behavioral: Negative.     Objective:  BP 110/80 mmHg  Pulse 71  Temp(Src) 98.1 F (36.7 C) (Oral)  Ht 5\' 9"  (1.753 m)  Wt 202 lb (91.627 kg)  BMI 29.82 kg/m2  SpO2 97%  BP Readings from Last 3 Encounters:  01/18/16 110/80  12/29/15 116/76  12/02/15 128/87    Wt Readings from Last 3 Encounters:  01/18/16 202 lb (91.627 kg)  12/29/15 200 lb (90.719 kg)  12/02/15 200 lb (90.719 kg)    Physical Exam  Constitutional: He is oriented to person, place, and time. No distress.  HENT:  Head: Normocephalic and atraumatic.  Mouth/Throat: Oropharynx is clear and moist. No oropharyngeal exudate.  Eyes: Conjunctivae are normal. Right eye exhibits no discharge. Left eye exhibits no discharge. No scleral icterus.  Neck: Normal range of motion. Neck supple. No JVD present. No tracheal deviation present. No thyromegaly present.  Cardiovascular: Normal rate, regular rhythm, normal  heart sounds and intact distal pulses.  Exam reveals no gallop and no friction rub.   No murmur heard. Pulses:      Carotid pulses are 2+ on the right side, and 2+ on the left side.      Radial pulses are 2+ on the right side, and 2+ on the left side.       Femoral pulses are 1+ on the right side, and 1+ on the left side.      Popliteal pulses are 1+ on the right side, and 1+ on the left side.       Dorsalis pedis pulses are 2+ on the right side, and 2+ on the left side.       Posterior tibial pulses are 1+ on the right side, and 1+ on the left side.  Pulmonary/Chest: Effort normal and breath sounds normal. No stridor. No respiratory distress. He has no wheezes. He has no rales. He exhibits no tenderness.  Abdominal: Soft. Bowel sounds are normal. He exhibits no distension and no mass. There is no tenderness. There is  no rebound and no guarding. Hernia confirmed negative in the right inguinal area and confirmed negative in the left inguinal area.  Genitourinary: Rectum normal, testes normal and penis normal. Rectal exam shows no external hemorrhoid, no internal hemorrhoid, no fissure, no mass, no tenderness and anal tone normal. Guaiac negative stool. Prostate is enlarged (2+ smooth symm BPH). Prostate is not tender. Right testis shows no mass, no swelling and no tenderness. Right testis is descended. Left testis shows no mass, no swelling and no tenderness. Left testis is descended. Circumcised. No penile erythema or penile tenderness. No discharge found.  Musculoskeletal: Normal range of motion. He exhibits no edema or tenderness.  Lymphadenopathy:    He has no cervical adenopathy.       Right: No inguinal adenopathy present.       Left: No inguinal adenopathy present.  Neurological: He is oriented to person, place, and time.  Skin: Skin is warm and dry. No rash noted. He is not diaphoretic. No erythema. No pallor.  Vitals reviewed.   Lab Results  Component Value Date   WBC 7.7 01/18/2016     HGB 15.2 01/18/2016   HCT 44.0 01/18/2016   PLT 423.0* 01/18/2016   GLUCOSE 106* 01/18/2016   CHOL 250* 01/18/2016   TRIG * 01/18/2016    509.0 Triglyceride is over 400; calculations on Lipids are invalid.   HDL 45.60 01/18/2016   LDLDIRECT 112.0 01/18/2016   LDLCALC 137* 09/08/2011   ALT 18 01/18/2016   AST 12 01/18/2016   NA 132* 01/18/2016   K 3.8 01/18/2016   CL 95* 01/18/2016   CREATININE 1.02 01/18/2016   BUN 20 01/18/2016   CO2 27 01/18/2016   TSH 1.49 01/18/2016   PSA 2.62 01/18/2016   INR 2.4 12/17/2015   HGBA1C 5.6 01/18/2016    No results found.  Assessment & Plan:   Bogar was seen today for annual exam.  Diagnoses and all orders for this visit:  Essential hypertension, malignant- his blood pressures well controlled, electrolytes and renal function are stable, I do not see any evidence of secondary causes for hypertension or end organ damage. -     Comprehensive metabolic panel; Future -     CBC with Differential/Platelet; Future -     TSH; Future -     Urinalysis, Routine w reflex microscopic (not at [facility]); Future -     carvedilol (COREG) 25 MG tablet; Take 1 tablet (25 mg total) by mouth 2 (two) times daily. -     potassium chloride SA (K-DUR,KLOR-CON) 20 MEQ tablet; Take 1 tablet (20 mEq total) by mouth daily.  Kidney disease, chronic, stage III (GFR 30-59 ml/min)- his renal function has improved, he will continue to avoid nephrotoxic agents,  will continue to maintain good blood pressure control. -     Comprehensive metabolic panel; Future -     Urinalysis, Routine w reflex microscopic ([facility]); Future  PSA elevation- his PSA has declined some so I'm not concerned about prostate cancer -     PSA; Future  PVD (peripheral vascular disease) ([place])- he had an AP patient is right distal foot due to an arterial clot, I don't think he actually have peripheral artery disease but we'll continue to treat risk factors with blood pressure control. -      Lipid panel; Future  Hyperglycemia- improvement noted -     Comprehensive metabolic panel; Future -     Hemoglobin A1c; Future  Hyperlipidemia with target LDL less than  91- he is achieved his LDL goal -     Lipid panel; Future -     TSH; Future  Hypokalemia- improvement noted -     potassium chloride SA (K-DUR,KLOR-CON) 20 MEQ tablet; Take 1 tablet (20 mEq total) by mouth daily.  Need for hepatitis C screening test -     Hepatitis C antibody; Future  Need for vaccination with 13-polyvalent pneumococcal conjugate vaccine -     Pneumococcal conjugate vaccine 13-valent  Pure hyperglyceridemia -     omega-3 acid ethyl esters (LOVAZA) 1 g capsule; Take 2 capsules (2 g total) by mouth 2 (two) times daily.   I have changed Mr. Carosella carvedilol. I am also having him start on omega-3 acid ethyl esters. Additionally, I am having him maintain his spironolactone, chlorthalidone, carvedilol, amLODipine, tadalafil, warfarin, and potassium chloride SA.  Meds ordered this encounter  Medications  . carvedilol (COREG) 25 MG tablet    Sig: Take 1 tablet (25 mg total) by mouth 2 (two) times daily.    Dispense:  180 tablet    Refill:  3  . potassium chloride SA (K-DUR,KLOR-CON) 20 MEQ tablet    Sig: Take 1 tablet (20 mEq total) by mouth daily.    Dispense:  90 tablet    Refill:  1  . omega-3 acid ethyl esters (LOVAZA) 1 g capsule    Sig: Take 2 capsules (2 g total) by mouth 2 (two) times daily.    Dispense:  120 capsule    Refill:  11     Follow-up: Return in about 6 months (around 07/19/2016).  Scarlette Calico, MD

## 2016-01-18 NOTE — Progress Notes (Signed)
Pre visit review using our clinic review tool, if applicable. No additional management support is needed unless otherwise documented below in the visit note. 

## 2016-01-18 NOTE — Patient Instructions (Signed)
Hypertension Hypertension, commonly called high blood pressure, is when the force of blood pumping through your arteries is too strong. Your arteries are the blood vessels that carry blood from your heart throughout your body. A blood pressure reading consists of a higher number over a lower number, such as 110/72. The higher number (systolic) is the pressure inside your arteries when your heart pumps. The lower number (diastolic) is the pressure inside your arteries when your heart relaxes. Ideally you want your blood pressure below 120/80. Hypertension forces your heart to work harder to pump blood. Your arteries may become narrow or stiff. Having untreated or uncontrolled hypertension can cause heart attack, stroke, kidney disease, and other problems. RISK FACTORS Some risk factors for high blood pressure are controllable. Others are not.  Risk factors you cannot control include:   Race. You may be at higher risk if you are African American.  Age. Risk increases with age.  Gender. Men are at higher risk than women before age 45 years. After age 65, women are at higher risk than men. Risk factors you can control include:  Not getting enough exercise or physical activity.  Being overweight.  Getting too much fat, sugar, calories, or salt in your diet.  Drinking too much alcohol. SIGNS AND SYMPTOMS Hypertension does not usually cause signs or symptoms. Extremely high blood pressure (hypertensive crisis) may cause headache, anxiety, shortness of breath, and nosebleed. DIAGNOSIS To check if you have hypertension, your health care provider will measure your blood pressure while you are seated, with your arm held at the level of your heart. It should be measured at least twice using the same arm. Certain conditions can cause a difference in blood pressure between your right and left arms. A blood pressure reading that is higher than normal on one occasion does not mean that you need treatment. If  it is not clear whether you have high blood pressure, you may be asked to return on a different day to have your blood pressure checked again. Or, you may be asked to monitor your blood pressure at home for 1 or more weeks. TREATMENT Treating high blood pressure includes making lifestyle changes and possibly taking medicine. Living a healthy lifestyle can help lower high blood pressure. You may need to change some of your habits. Lifestyle changes may include:  Following the DASH diet. This diet is high in fruits, vegetables, and whole grains. It is low in salt, red meat, and added sugars.  Keep your sodium intake below 2,300 mg per day.  Getting at least 30-45 minutes of aerobic exercise at least 4 times per week.  Losing weight if necessary.  Not smoking.  Limiting alcoholic beverages.  Learning ways to reduce stress. Your health care provider may prescribe medicine if lifestyle changes are not enough to get your blood pressure under control, and if one of the following is true:  You are 18-59 years of age and your systolic blood pressure is above 140.  You are 60 years of age or older, and your systolic blood pressure is above 150.  Your diastolic blood pressure is above 90.  You have diabetes, and your systolic blood pressure is over 140 or your diastolic blood pressure is over 90.  You have kidney disease and your blood pressure is above 140/90.  You have heart disease and your blood pressure is above 140/90. Your personal target blood pressure may vary depending on your medical conditions, your age, and other factors. HOME CARE INSTRUCTIONS    Have your blood pressure rechecked as directed by your health care provider.   Take medicines only as directed by your health care provider. Follow the directions carefully. Blood pressure medicines must be taken as prescribed. The medicine does not work as well when you skip doses. Skipping doses also puts you at risk for  problems.  Do not smoke.   Monitor your blood pressure at home as directed by your health care provider. SEEK MEDICAL CARE IF:   You think you are having a reaction to medicines taken.  You have recurrent headaches or feel dizzy.  You have swelling in your ankles.  You have trouble with your vision. SEEK IMMEDIATE MEDICAL CARE IF:  You develop a severe headache or confusion.  You have unusual weakness, numbness, or feel faint.  You have severe chest or abdominal pain.  You vomit repeatedly.  You have trouble breathing. MAKE SURE YOU:   Understand these instructions.  Will watch your condition.  Will get help right away if you are not doing well or get worse.   This information is not intended to replace advice given to you by your health care provider. Make sure you discuss any questions you have with your health care provider.   Document Released: 07/24/2005 Document Revised: 12/08/2014 Document Reviewed: 05/16/2013 Elsevier Interactive Patient Education 2016 Elsevier Inc.  

## 2016-01-19 NOTE — Assessment & Plan Note (Signed)

## 2016-01-20 ENCOUNTER — Other Ambulatory Visit: Payer: Self-pay | Admitting: *Deleted

## 2016-01-20 MED ORDER — WARFARIN SODIUM 5 MG PO TABS: ORAL_TABLET | ORAL | Status: DC

## 2016-01-21 ENCOUNTER — Telehealth: Payer: Self-pay | Admitting: *Deleted

## 2016-01-21 ENCOUNTER — Ambulatory Visit (INDEPENDENT_AMBULATORY_CARE_PROVIDER_SITE_OTHER): Payer: Medicare Other | Admitting: General Practice

## 2016-01-21 DIAGNOSIS — Z5181 Encounter for therapeutic drug level monitoring: Secondary | ICD-10-CM

## 2016-01-21 DIAGNOSIS — I743 Embolism and thrombosis of arteries of the lower extremities: Secondary | ICD-10-CM

## 2016-01-21 LAB — POCT INR: INR: 1.8

## 2016-01-21 NOTE — Progress Notes (Signed)
I have reviewed and agree with the plan. 

## 2016-01-21 NOTE — Progress Notes (Signed)
Pre visit review using our clinic review tool, if applicable. No additional management support is needed unless otherwise documented below in the visit note. 

## 2016-01-21 NOTE — Telephone Encounter (Signed)
Receive call from pt father stating MD was going to send rx in for some fish oil, but pharmacy stated they never received. Inform pt father per chart MD sent "Lovaza" on 6/13 to [facility]. He stated he just spoke with them and they said they didn't have it. Inform pt father i will call [facility] to check status. Called pharmacy spoke with pharmacist he stated that for some reason the script was on hold. He will process it, but not sure if they have in stock may be Monday before they get it in. Called pt father back inform him what pharmacist inform me...Johny Chess

## 2016-01-31 ENCOUNTER — Encounter: Payer: Self-pay | Admitting: Family Medicine

## 2016-01-31 ENCOUNTER — Ambulatory Visit (INDEPENDENT_AMBULATORY_CARE_PROVIDER_SITE_OTHER): Payer: Medicare Other | Admitting: Family Medicine

## 2016-01-31 VITALS — BP 124/83 | HR 72 | Ht 69.0 in | Wt 202.0 lb

## 2016-01-31 DIAGNOSIS — I743 Embolism and thrombosis of arteries of the lower extremities: Secondary | ICD-10-CM | POA: Diagnosis not present

## 2016-01-31 DIAGNOSIS — M1711 Unilateral primary osteoarthritis, right knee: Secondary | ICD-10-CM | POA: Diagnosis not present

## 2016-01-31 NOTE — Patient Instructions (Signed)
Straight leg raises, straight leg raises with foot turned outwards, and hip side raises 3 sets of 10 once a day. Add ankle weight if these become too easy. Call me if you want to try physical therapy. Or call me if you're not improving and want to try a plica injection. Follow up with me as needed otherwise.

## 2016-02-02 NOTE — Progress Notes (Signed)
PCP: Scarlette Calico, MD  Subjective:   HPI: Patient is a 60 y.o. male here for right knee pain.  4/27: Patient reports he's had constant right knee pain for past 3 weeks. Has known DJD. Recalls having a cortisone injection about 10 years ago that seemed to last about 8 years. Then another one 2 years ago helped. Has had off and on pain since then until past 3 weeks. Pain level is 0/10 at rest, up to 8/10 and sharp with walking. Pain is anterior, medial. No skin changes, numbness.  5/24: Patient reports improvement following injection. Pain level now is more anterior, 7/10 and sharp. Associated popping when walking outside and anterolateral pain. Worse with walking. Better with rest. No skin changes, numbness.  6/26: Patient reports he feels much better. Still getting occasional popping anterior knee when walking. Not as bad as before though. Current pain is 0/10 level. No skin changes, numbness.  Past Medical History  Diagnosis Date  . Crohn's disease ([place])     tx. Imuran  . Perianal abscess 2001    s/p right hemicolectomy, and drainage of retroperitoneal abcess 2003  . Hypertension   . Hearing loss     wears hearing aid left ear; birthed with hearing loss  . GI bleed 6/11    w/ normal EGD 6/11, 3 PRBCs   . Anemia     anemia thrombocytopenia, saw Hematology 2011, can not r/o myeloproliferative d/c  . Transfusion history     last 8'13  . Ileostomy in place [facility]) 04-11-13    04-18-13 ileostomy to be taken down.  Marland Kitchen HOH (hard of hearing) 05/16/2013  . Antithrombin III deficiency ([place]) 05/16/2013    On coumadin for this  . Chronic kidney disease 05/27/2013    ACUTE RENAL FAILURE   . Hypercalcemia   . Hyperkalemia 05/27/2013  . Blood clot in vein     in right leg  . Peripheral vascular disease [facility])     Current Outpatient Prescriptions on File Prior to Visit  Medication Sig Dispense Refill  . amLODipine (NORVASC) 10 MG tablet Take 10 mg by mouth at bedtime.     .  carvedilol (COREG) 12.5 MG tablet Take 12.5 mg by mouth 2 (two) times daily with a meal.    . carvedilol (COREG) 25 MG tablet Take 1 tablet (25 mg total) by mouth 2 (two) times daily. 180 tablet 3  . chlorthalidone (HYGROTON) 25 MG tablet Take 25 mg by mouth every morning.    Marland Kitchen omega-3 acid ethyl esters (LOVAZA) 1 g capsule Take 2 capsules (2 g total) by mouth 2 (two) times daily. 120 capsule 11  . potassium chloride SA (K-DUR,KLOR-CON) 20 MEQ tablet Take 1 tablet (20 mEq total) by mouth daily. 90 tablet 1  . spironolactone (ALDACTONE) 25 MG tablet Take 25 mg by mouth every morning.     . tadalafil (CIALIS) 5 MG tablet Take 1 tablet (5 mg total) by mouth daily as needed for erectile dysfunction. 30 tablet 11  . warfarin (COUMADIN) 5 MG tablet TAKE AS DIRECTED BY  ANTICOAGULATION  CLINIC 40 tablet 3   No current facility-administered medications on file prior to visit.    Past Surgical History  Procedure Laterality Date  . Hemicolectomy  08/29/2001    perforated abscess  . Anal fistulotomy  2002  . Partial colectomy  03/11/2012    Procedure: PARTIAL COLECTOMY;  Surgeon: Edward Jolly, MD;  Location: [facility];  Service: General;  Laterality: N/A;  subtotal colectomy  transverse and left colon   . Embolectomy  03/12/2012    Procedure: EMBOLECTOMY;  Surgeon: Angelia Mould, MD;  Location: [facility] OR;  Service: Vascular;  Laterality: Right;  Right popliteal embolectomy with vein patch angioplasty, right posterior tibial embolectomy with vein patch angioplasty   . Intraoperative arteriogram  03/12/2012    Procedure: INTRA OPERATIVE ARTERIOGRAM;  Surgeon: Angelia Mould, MD;  Location: [place];  Service: Vascular;  Laterality: Right;  . Transmetatarsal amputation  05/10/2012    right  . Ileostomy closure N/A 04/18/2013    Procedure: ILEOSTOMY TAKEDOWN;  Surgeon: Edward Jolly, MD;  Location: [facility];  Service: General;  Laterality: N/A;    Allergies  Allergen Reactions  . Mesalamine  Rash    REACTION: Rash    Social History   Social History  . Marital Status: Single    Spouse Name: N/A  . Number of Children: N/A  . Years of Education: N/A   Occupational History  . [employer]     Social History Main Topics  . Smoking status: Former Smoker -- 0.25 packs/day for 9 years    Types: Cigarettes    Quit date: 05/09/2011  . Smokeless tobacco: Never Used  . Alcohol Use: No  . Drug Use: Yes    Special: Marijuana  . Sexual Activity: Not Currently   Other Topics Concern  . Not on file   Social History Narrative   Lives by himself   Regular exercise- no           Family History  Problem Relation Age of Onset  . Coronary artery disease Neg Hx   . Diabetes Neg Hx   . Colon cancer Neg Hx   . Prostate cancer Neg Hx   . Stroke      GF, aunts   . Hypertension      "the whole family"  . Hypertension Mother   . Hyperlipidemia Mother   . Hypertension Father   . Hyperlipidemia Father   . Heart disease Father   . Kidney failure Neg Hx     BP 124/83 mmHg  Pulse 72  Ht 5\' 9"  (1.753 m)  Wt 202 lb (91.627 kg)  BMI 29.82 kg/m2  Review of Systems: See HPI above.    Objective:  Physical Exam:  Gen: NAD, comfortable in exam room  Right knee: No gross deformity, ecchymoses, effusion. On flexion to extension can feel multiple plicae about knee - medial and lateral about patella. Minimal tenderness of the plica lateral proximal tibia.  No other tenderness currently including joint lines. FROM. Negative ant/post drawers. Negative valgus/varus testing. Negative lachmanns. Negative mcmurrays, apleys, patellar apprehension. NV intact distally.  Left knee: FROM without pain.    Assessment & Plan:  1. Right knee pain - underlying DJD, plicae.  Clinically improved quite a bit from last visit.  Reviewed home exercises to do again.  Consider physical therapy, injection if not improving.  Icing as needed.  Tylenol if needed.  F/u prn.

## 2016-02-02 NOTE — Assessment & Plan Note (Signed)
underlying DJD, plicae.  Clinically improved quite a bit from last visit.  Reviewed home exercises to do again.  Consider physical therapy, injection if not improving.  Icing as needed.  Tylenol if needed.  F/u prn.

## 2016-03-03 ENCOUNTER — Ambulatory Visit (INDEPENDENT_AMBULATORY_CARE_PROVIDER_SITE_OTHER): Payer: Medicare Other | Admitting: General Practice

## 2016-03-03 DIAGNOSIS — Z5181 Encounter for therapeutic drug level monitoring: Secondary | ICD-10-CM

## 2016-03-03 DIAGNOSIS — I743 Embolism and thrombosis of arteries of the lower extremities: Secondary | ICD-10-CM

## 2016-03-03 LAB — POCT INR: INR: 1.5

## 2016-03-03 NOTE — Progress Notes (Signed)
I have reviewed and agree with the plan. 

## 2016-03-23 ENCOUNTER — Ambulatory Visit (HOSPITAL_COMMUNITY)
Admission: EM | Admit: 2016-03-23 | Discharge: 2016-03-23 | Disposition: A | Discharge status: Home / Self Care | Payer: Medicare Other | Attending: Family Medicine | Admitting: Family Medicine

## 2016-03-23 ENCOUNTER — Encounter (HOSPITAL_COMMUNITY): Payer: Self-pay | Admitting: Family Medicine

## 2016-03-23 DIAGNOSIS — M10072 Idiopathic gout, left ankle and foot: Secondary | ICD-10-CM

## 2016-03-23 DIAGNOSIS — M25572 Pain in left ankle and joints of left foot: Secondary | ICD-10-CM

## 2016-03-23 DIAGNOSIS — M25472 Effusion, left ankle: Secondary | ICD-10-CM

## 2016-03-23 MED ORDER — PREDNISONE 20 MG PO TABS: ORAL_TABLET | ORAL | 0 refills | Status: DC

## 2016-03-23 NOTE — ED Triage Notes (Signed)
Pt here for left foot and ankle swelling that started 2 days ago. sts that he got out of the bed 2 days ago and the pain made him fall. Obvious swelling.

## 2016-03-23 NOTE — ED Provider Notes (Signed)
[place]    CSN: [id number] Arrival date & time: 03/23/16  [id number]  First Provider Contact:  None       History   Chief Complaint Chief Complaint  Patient presents with  . Foot Pain    Rick Edwards is a 60 y.o. male. He presents with left ankle swelling this been going on for several days. It began spontaneously and is not related to any trauma. He thinks he's had gout before.  He's had no fever or other joint pain.  Rick  Past Medical History:  Diagnosis Date  . Anemia    anemia thrombocytopenia, saw Hematology 2011, can not r/o myeloproliferative d/c  . Antithrombin III deficiency ([place]) 05/16/2013   On coumadin for this  . Blood clot in vein    in right leg  . Chronic kidney disease 05/27/2013   ACUTE RENAL FAILURE   . Crohn's disease ([place])    tx. Imuran  . GI bleed 6/11   w/ normal EGD 6/11, 3 PRBCs   . Hearing loss    wears hearing aid left ear; birthed with hearing loss  . HOH (hard of hearing) 05/16/2013  . Hypercalcemia   . Hyperkalemia 05/27/2013  . Hypertension   . Ileostomy in place [facility]) 04-11-13   04-18-13 ileostomy to be taken down.  . Perianal abscess 2001   s/p right hemicolectomy, and drainage of retroperitoneal abcess 2003  . Peripheral vascular disease ([place])   . Transfusion history    last 8'13    Patient Active Problem List   Diagnosis Date Noted  . Hyperglycemia 01/18/2016  . Hyperlipidemia with target LDL less than 70 01/18/2016  . Hypokalemia 01/18/2016  . Need for hepatitis C screening test 01/18/2016  . Proteinuria 03/13/2014  . Right knee DJD 11/25/2013  . PVD (peripheral vascular disease) ([place]) 09/24/2013  . Encounter for therapeutic drug monitoring 09/16/2013  . Kidney disease, chronic, stage III (GFR 30-59 ml/min) 05/26/2013  . Antithrombin III deficiency ([place]) 05/16/2013  . Long term (current) use of anticoagulants 11/27/2012  . PSA elevation 08/15/2012  . Embolism and thrombosis of arteries of lower  extremity ([place]) 04/10/2012  . Splenic infarction 03/20/2012  . Routine general medical examination at a health care facility 08/29/2011  . Benign neoplasm of colon 05/06/2009  . CROHN'S DISEASE-LARGE INTESTINE 03/19/2009  . Essential hypertension, malignant 03/02/2009    Past Surgical History:  Procedure Laterality Date  . Anal fistulotomy  2002  . EMBOLECTOMY  03/12/2012   Procedure: EMBOLECTOMY;  Surgeon: Angelia Mould, MD;  Location: [facility] OR;  Service: Vascular;  Laterality: Right;  Right popliteal embolectomy with vein patch angioplasty, right posterior tibial embolectomy with vein patch angioplasty   . HEMICOLECTOMY  08/29/2001   perforated abscess  . ILEOSTOMY CLOSURE N/A 04/18/2013   Procedure: ILEOSTOMY TAKEDOWN;  Surgeon: Edward Jolly, MD;  Location: [facility];  Service: General;  Laterality: N/A;  . INTRAOPERATIVE ARTERIOGRAM  03/12/2012   Procedure: INTRA OPERATIVE ARTERIOGRAM;  Surgeon: Angelia Mould, MD;  Location: [place];  Service: Vascular;  Laterality: Right;  . PARTIAL COLECTOMY  03/11/2012   Procedure: PARTIAL COLECTOMY;  Surgeon: Edward Jolly, MD;  Location: [facility];  Service: General;  Laterality: N/A;  subtotal colectomy transverse and left colon   . TRANSMETATARSAL AMPUTATION  05/10/2012   right       Home Medications    Prior to Admission medications   Medication Sig Start Date End Date Taking? Authorizing Provider  amLODipine (  NORVASC) 10 MG tablet Take 10 mg by mouth at bedtime.  10/15/14   Historical Provider, MD  carvedilol (COREG) 12.5 MG tablet Take 12.5 mg by mouth 2 (two) times daily with a meal.    Historical Provider, MD  carvedilol (COREG) 25 MG tablet Take 1 tablet (25 mg total) by mouth 2 (two) times daily. 01/18/16   Janith Lima, MD  chlorthalidone (HYGROTON) 25 MG tablet Take 25 mg by mouth every morning.    Historical Provider, MD  omega-3 acid ethyl esters (LOVAZA) 1 g capsule Take 2 capsules (2 g total) by mouth 2 (two)  times daily. 01/18/16   Janith Lima, MD  potassium chloride SA (K-DUR,KLOR-CON) 20 MEQ tablet Take 1 tablet (20 mEq total) by mouth daily. 01/18/16   Janith Lima, MD  predniSONE (DELTASONE) 20 MG tablet Two daily with food 03/23/16   Robyn Haber, MD  spironolactone (ALDACTONE) 25 MG tablet Take 25 mg by mouth every morning.     Historical Provider, MD  tadalafil (CIALIS) 5 MG tablet Take 1 tablet (5 mg total) by mouth daily as needed for erectile dysfunction. 01/12/15   Janith Lima, MD  warfarin (COUMADIN) 5 MG tablet TAKE AS DIRECTED BY  ANTICOAGULATION  CLINIC 01/20/16   Janith Lima, MD    Family History Family History  Problem Relation Age of Onset  . Hypertension Mother   . Hyperlipidemia Mother   . Hypertension Father   . Hyperlipidemia Father   . Heart disease Father   . Stroke      GF, aunts   . Hypertension      "the whole family"  . Coronary artery disease Neg Hx   . Diabetes Neg Hx   . Colon cancer Neg Hx   . Prostate cancer Neg Hx   . Kidney failure Neg Hx     Social History Social History  Substance Use Topics  . Smoking status: Former Smoker    Packs/day: 0.25    Years: 9.00    Types: Cigarettes    Quit date: 05/09/2011  . Smokeless tobacco: Never Used  . Alcohol use No     Allergies   Mesalamine   Review of Systems Review of Systems   Physical Exam Triage Vital Signs ED Triage Vitals  Enc Vitals Group     BP 03/23/16 1030 119/83     Pulse Rate 03/23/16 1030 78     Resp 03/23/16 1030 18     Temp 03/23/16 1030 98.6 F (37 C)     Temp src --      SpO2 03/23/16 1030 97 %     Weight --      Height --      Head Circumference --      Peak Flow --      Pain Score 03/23/16 1032 10     Pain Loc --      Pain Edu? --      Excl. in [place]? --    No data found.   Updated Vital Signs BP 119/83   Pulse 78   Temp 98.6 F (37 C)   Resp 18   SpO2 97%     Physical Exam Patient is alert and cooperative. He is in no acute distress. He is  accompanied by a friend.  Patient's exam is limited to his left lower extremity. There is no tenderness or swelling in the calf but he does have mild swelling in the left ankle. He's  tender along the joint line of the ankle but there is no bony tenderness. He's able to move his ankle and foot in a normal range of motion.  There no skin breaks and no erythema, a temperature of the skin is symmetric.  UC Treatments / Results  Labs (all labs ordered are listed, but only abnormal results are displayed) Labs Reviewed - No data to display  EKG  EKG Interpretation None       Radiology No results found.  Procedures Procedures (including critical care time)  Medications Ordered in UC Medications - No data to display   Initial Impression / Assessment and Plan / UC Course  I have reviewed the triage vital signs and the nursing notes.  Pertinent labs & imaging results that were available during my care of the patient were reviewed by me and considered in my medical decision making (see chart for details).  Clinical Course    unchanged  Final Clinical Impressions(s) / UC Diagnoses   Final diagnoses:  Acute idiopathic gout of left ankle  Pain and swelling of left ankle    New Prescriptions New Prescriptions   PREDNISONE (DELTASONE) 20 MG TABLET    Two daily with food     Robyn Haber, MD 03/23/16 1059

## 2016-03-31 ENCOUNTER — Ambulatory Visit (INDEPENDENT_AMBULATORY_CARE_PROVIDER_SITE_OTHER): Payer: Medicare Other | Admitting: General Practice

## 2016-03-31 DIAGNOSIS — I743 Embolism and thrombosis of arteries of the lower extremities: Secondary | ICD-10-CM

## 2016-03-31 DIAGNOSIS — Z5181 Encounter for therapeutic drug level monitoring: Secondary | ICD-10-CM

## 2016-03-31 LAB — POCT INR: INR: 1.9

## 2016-03-31 NOTE — Progress Notes (Signed)
I have reviewed and agree with the plan. 

## 2016-04-05 ENCOUNTER — Encounter: Payer: Self-pay | Admitting: Internal Medicine

## 2016-04-05 ENCOUNTER — Other Ambulatory Visit (INDEPENDENT_AMBULATORY_CARE_PROVIDER_SITE_OTHER): Payer: Medicare Other

## 2016-04-05 ENCOUNTER — Ambulatory Visit (INDEPENDENT_AMBULATORY_CARE_PROVIDER_SITE_OTHER): Payer: Medicare Other | Admitting: Internal Medicine

## 2016-04-05 VITALS — BP 126/80 | HR 64 | Temp 98.4°F | Resp 16 | Ht 69.0 in | Wt 209.0 lb

## 2016-04-05 DIAGNOSIS — I10 Essential (primary) hypertension: Secondary | ICD-10-CM

## 2016-04-05 DIAGNOSIS — E781 Pure hyperglyceridemia: Secondary | ICD-10-CM | POA: Insufficient documentation

## 2016-04-05 DIAGNOSIS — I743 Embolism and thrombosis of arteries of the lower extremities: Secondary | ICD-10-CM

## 2016-04-05 DIAGNOSIS — Z23 Encounter for immunization: Secondary | ICD-10-CM | POA: Diagnosis not present

## 2016-04-05 LAB — BASIC METABOLIC PANEL
BUN: 19 mg/dL (ref 6–23)
CO2: 28 meq/L (ref 19–32)
Calcium: 9.2 mg/dL (ref 8.4–10.5)
Chloride: 97 mEq/L (ref 96–112)
Creatinine, Ser: 1.1 mg/dL (ref 0.40–1.50)
GFR: 88.48 mL/min (ref >60.00)
GLUCOSE: 115 mg/dL — AB (ref 70–99)
POTASSIUM: 4 meq/L (ref 3.5–5.1)
SODIUM: 132 meq/L — AB (ref 135–145)

## 2016-04-05 LAB — TRIGLYCERIDES: TRIGLYCERIDES: 334 mg/dL — AB (ref 0.0–149.0)

## 2016-04-05 MED ORDER — OMEGA-3-ACID ETHYL ESTERS 1 G PO CAPS: 2.0000 g | ORAL_CAPSULE | Freq: Two times a day (BID) | ORAL | 11 refills | Status: DC

## 2016-04-05 NOTE — Progress Notes (Signed)
Subjective:  Patient ID: Rick Edwards, male    DOB: 06/20/1956  Age: 60 y.o. MRN: [id number]  CC: Hypertension and Hyperlipidemia   HPI [name] presents for follow-up on hypertension and high triglycerides. He has not been taking the fish oil supplement supplement because he didn't think he had any refills at the pharmacy, however the prescription written 2 months ago shows refills for a year. He tells me his blood pressures been well controlled on the combination of amlodipine, carvedilol, chlorthalidone, and spironolactone. He has had no recent episodes of headache/blurred vision/chest pain/shortness of breath.  Outpatient Medications Prior to Visit  Medication Sig Dispense Refill  . amLODipine (NORVASC) 10 MG tablet Take 10 mg by mouth at bedtime.     . carvedilol (COREG) 25 MG tablet Take 1 tablet (25 mg total) by mouth 2 (two) times daily. 180 tablet 3  . chlorthalidone (HYGROTON) 25 MG tablet Take 25 mg by mouth every morning.    Marland Kitchen spironolactone (ALDACTONE) 25 MG tablet Take 25 mg by mouth every morning.     . warfarin (COUMADIN) 5 MG tablet TAKE AS DIRECTED BY  ANTICOAGULATION  CLINIC 40 tablet 3  . potassium chloride SA (K-DUR,KLOR-CON) 20 MEQ tablet Take 1 tablet (20 mEq total) by mouth daily. (Patient not taking: Reported on 04/05/2016) 90 tablet 1  . carvedilol (COREG) 12.5 MG tablet Take 12.5 mg by mouth 2 (two) times daily with a meal.    . omega-3 acid ethyl esters (LOVAZA) 1 g capsule Take 2 capsules (2 g total) by mouth 2 (two) times daily. (Patient not taking: Reported on 04/05/2016) 120 capsule 11  . predniSONE (DELTASONE) 20 MG tablet Two daily with food 10 tablet 0  . tadalafil (CIALIS) 5 MG tablet Take 1 tablet (5 mg total) by mouth daily as needed for erectile dysfunction. 30 tablet 11   No facility-administered medications prior to visit.     ROS Review of Systems  Constitutional: Negative.  Negative for activity change, diaphoresis, fatigue and  unexpected weight change.  HENT: Negative.   Eyes: Negative.  Negative for visual disturbance.  Respiratory: Negative.  Negative for cough, chest tightness, shortness of breath and stridor.   Cardiovascular: Negative.  Negative for chest pain, palpitations and leg swelling.  Gastrointestinal: Negative for abdominal pain, constipation, diarrhea, nausea and vomiting.  Endocrine: Negative.   Genitourinary: Negative.   Musculoskeletal: Negative.  Negative for arthralgias, back pain, joint swelling, myalgias and neck pain.  Skin: Negative.  Negative for color change and rash.  Allergic/Immunologic: Negative.   Neurological: Negative.  Negative for dizziness, tremors, weakness, light-headedness and numbness.  Hematological: Negative.  Negative for adenopathy. Does not bruise/bleed easily.  Psychiatric/Behavioral: Negative.     Objective:  BP 126/80 (BP Location: Left Arm, Patient Position: Sitting, Cuff Size: Normal)   Pulse 64   Temp 98.4 F (36.9 C) (Oral)   Resp 16   Ht 5\' 9"  (1.753 m)   Wt 209 lb (94.8 kg)   SpO2 96%   BMI 30.86 kg/m   BP Readings from Last 3 Encounters:  04/05/16 126/80  03/23/16 119/83  01/31/16 124/83    Wt Readings from Last 3 Encounters:  04/05/16 209 lb (94.8 kg)  01/31/16 202 lb (91.6 kg)  01/18/16 202 lb (91.6 kg)    Physical Exam  Constitutional: He is oriented to person, place, and time. No distress.  HENT:  Mouth/Throat: Oropharynx is clear and moist. No oropharyngeal exudate.  Eyes: Conjunctivae are normal. Right  eye exhibits no discharge. Left eye exhibits no discharge. No scleral icterus.  Neck: Normal range of motion. Neck supple. No JVD present. No tracheal deviation present. No thyromegaly present.  Cardiovascular: Normal rate, regular rhythm, normal heart sounds and intact distal pulses.  Exam reveals no gallop and no friction rub.   No murmur heard. Pulmonary/Chest: Effort normal and breath sounds normal. No stridor. No respiratory  distress. He has no wheezes. He has no rales. He exhibits no tenderness.  Abdominal: Soft. Bowel sounds are normal. He exhibits no distension and no mass. There is no tenderness. There is no rebound and no guarding.  Musculoskeletal: Normal range of motion. He exhibits no edema, tenderness or deformity.  Lymphadenopathy:    He has no cervical adenopathy.  Neurological: He is oriented to person, place, and time.  Skin: Skin is warm and dry. No rash noted. He is not diaphoretic. No erythema. No pallor.  Psychiatric: He has a normal mood and affect. His behavior is normal. Judgment and thought content normal.  Vitals reviewed.   Lab Results  Component Value Date   WBC 7.7 01/18/2016   HGB 15.2 01/18/2016   HCT 44.0 01/18/2016   PLT 423.0 (H) 01/18/2016   GLUCOSE 115 (H) 04/05/2016   CHOL 250 (H) 01/18/2016   TRIG 334.0 (H) 04/05/2016   HDL 45.60 01/18/2016   LDLDIRECT 112.0 01/18/2016   LDLCALC 137 (H) 09/08/2011   ALT 18 01/18/2016   AST 12 01/18/2016   NA 132 (L) 04/05/2016   K 4.0 04/05/2016   CL 97 04/05/2016   CREATININE 1.10 04/05/2016   BUN 19 04/05/2016   CO2 28 04/05/2016   TSH 1.49 01/18/2016   PSA 2.62 01/18/2016   INR 1.9 03/31/2016   HGBA1C 5.6 01/18/2016    No results found.  Assessment & Plan:   Mana was seen today for hypertension and hyperlipidemia.  Diagnoses and all orders for this visit:  Pure hyperglyceridemia- his triglycerides are better but still too high, this is an independent risk factor for cardiovascular events so Avastin to start taking the omega-3 acid ethyl esters fish oils as directed. -     Triglycerides; Future -     omega-3 acid ethyl esters (LOVAZA) 1 g capsule; Take 2 capsules (2 g total) by mouth 2 (two) times daily.  Essential hypertension, malignant- his blood pressure is adequately well-controlled, electrolytes and renal function are stable. -     Basic metabolic panel; Future  Need for prophylactic vaccination and  inoculation against influenza -     Flu Vaccine QUAD 36+ mos IM   I have discontinued Mr. Deane tadalafil and predniSONE. I am also having him maintain his spironolactone, chlorthalidone, amLODipine, carvedilol, potassium chloride SA, warfarin, and omega-3 acid ethyl esters.  Meds ordered this encounter  Medications  . omega-3 acid ethyl esters (LOVAZA) 1 g capsule    Sig: Take 2 capsules (2 g total) by mouth 2 (two) times daily.    Dispense:  120 capsule    Refill:  11     Follow-up: Return in about 6 months (around 10/04/2016).  Scarlette Calico, MD

## 2016-04-05 NOTE — Progress Notes (Signed)
Pre visit review using our clinic review tool, if applicable. No additional management support is needed unless otherwise documented below in the visit note. 

## 2016-04-05 NOTE — Patient Instructions (Signed)

## 2016-04-28 ENCOUNTER — Ambulatory Visit (INDEPENDENT_AMBULATORY_CARE_PROVIDER_SITE_OTHER): Payer: Medicare Other | Admitting: General Practice

## 2016-04-28 DIAGNOSIS — I743 Embolism and thrombosis of arteries of the lower extremities: Secondary | ICD-10-CM

## 2016-04-28 DIAGNOSIS — Z5181 Encounter for therapeutic drug level monitoring: Secondary | ICD-10-CM

## 2016-04-28 LAB — POCT INR: INR: 2.5

## 2016-04-28 NOTE — Progress Notes (Signed)
I have reviewed and agree with the plan. 

## 2016-05-17 DIAGNOSIS — H04123 Dry eye syndrome of bilateral lacrimal glands: Secondary | ICD-10-CM | POA: Diagnosis not present

## 2016-05-17 DIAGNOSIS — H2513 Age-related nuclear cataract, bilateral: Secondary | ICD-10-CM | POA: Diagnosis not present

## 2016-05-26 ENCOUNTER — Ambulatory Visit (INDEPENDENT_AMBULATORY_CARE_PROVIDER_SITE_OTHER): Payer: Medicare Other | Admitting: General Practice

## 2016-05-26 ENCOUNTER — Telehealth: Payer: Self-pay

## 2016-05-26 DIAGNOSIS — Z5181 Encounter for therapeutic drug level monitoring: Secondary | ICD-10-CM

## 2016-05-26 DIAGNOSIS — I743 Embolism and thrombosis of arteries of the lower extremities: Secondary | ICD-10-CM

## 2016-05-26 LAB — POCT INR: INR: 3.2

## 2016-05-26 NOTE — Patient Instructions (Signed)
Pre visit review using our clinic review tool, if applicable. No additional management support is needed unless otherwise documented below in the visit note. 

## 2016-05-26 NOTE — Telephone Encounter (Signed)
Pt came in for coumadin appt and wanted to know if there were any Cialis samples.

## 2016-05-28 NOTE — Progress Notes (Signed)
I have reviewed and agree with the plan. 

## 2016-06-16 ENCOUNTER — Ambulatory Visit (INDEPENDENT_AMBULATORY_CARE_PROVIDER_SITE_OTHER): Payer: Medicare Other | Admitting: General Practice

## 2016-06-16 DIAGNOSIS — I743 Embolism and thrombosis of arteries of the lower extremities: Secondary | ICD-10-CM

## 2016-06-16 DIAGNOSIS — Z5181 Encounter for therapeutic drug level monitoring: Secondary | ICD-10-CM | POA: Diagnosis not present

## 2016-06-16 LAB — POCT INR: INR: 1.8

## 2016-06-16 NOTE — Patient Instructions (Signed)
Pre visit review using our clinic review tool, if applicable. No additional management support is needed unless otherwise documented below in the visit note. 

## 2016-06-16 NOTE — Progress Notes (Signed)
I have reviewed and agree with the plan. 

## 2016-06-26 DIAGNOSIS — I10 Essential (primary) hypertension: Secondary | ICD-10-CM | POA: Diagnosis not present

## 2016-07-03 DIAGNOSIS — Z6830 Body mass index (BMI) 30.0-30.9, adult: Secondary | ICD-10-CM | POA: Diagnosis not present

## 2016-07-03 DIAGNOSIS — I10 Essential (primary) hypertension: Secondary | ICD-10-CM | POA: Diagnosis not present

## 2016-07-03 LAB — BASIC METABOLIC PANEL
BUN: 20 mg/dL (ref 4–21)
Creatinine: 1.1 mg/dL (ref 0.6–1.3)
GLUCOSE: 101 mg/dL
POTASSIUM: 3.8 mmol/L (ref 3.4–5.3)
SODIUM: 134 mmol/L — AB (ref 137–147)

## 2016-07-14 ENCOUNTER — Ambulatory Visit (INDEPENDENT_AMBULATORY_CARE_PROVIDER_SITE_OTHER): Payer: Medicare Other | Admitting: General Practice

## 2016-07-14 DIAGNOSIS — I743 Embolism and thrombosis of arteries of the lower extremities: Secondary | ICD-10-CM

## 2016-07-14 DIAGNOSIS — Z5181 Encounter for therapeutic drug level monitoring: Secondary | ICD-10-CM

## 2016-07-14 LAB — POCT INR: INR: 2.6

## 2016-07-14 NOTE — Patient Instructions (Signed)
Pre visit review using our clinic review tool, if applicable. No additional management support is needed unless otherwise documented below in the visit note. 

## 2016-07-19 ENCOUNTER — Encounter: Payer: Self-pay | Admitting: Internal Medicine

## 2016-07-19 ENCOUNTER — Ambulatory Visit (INDEPENDENT_AMBULATORY_CARE_PROVIDER_SITE_OTHER): Payer: Medicare Other | Admitting: Internal Medicine

## 2016-07-19 VITALS — BP 124/80 | HR 67 | Temp 97.3°F | Resp 16 | Ht 69.0 in | Wt 205.2 lb

## 2016-07-19 DIAGNOSIS — I743 Embolism and thrombosis of arteries of the lower extremities: Secondary | ICD-10-CM

## 2016-07-19 DIAGNOSIS — N522 Drug-induced erectile dysfunction: Secondary | ICD-10-CM | POA: Insufficient documentation

## 2016-07-19 DIAGNOSIS — K5010 Crohn's disease of large intestine without complications: Secondary | ICD-10-CM | POA: Diagnosis not present

## 2016-07-19 DIAGNOSIS — D75839 Thrombocytosis, unspecified: Secondary | ICD-10-CM | POA: Insufficient documentation

## 2016-07-19 DIAGNOSIS — H6123 Impacted cerumen, bilateral: Secondary | ICD-10-CM | POA: Insufficient documentation

## 2016-07-19 DIAGNOSIS — D473 Essential (hemorrhagic) thrombocythemia: Secondary | ICD-10-CM | POA: Diagnosis not present

## 2016-07-19 DIAGNOSIS — I10 Essential (primary) hypertension: Secondary | ICD-10-CM

## 2016-07-19 MED ORDER — TADALAFIL 5 MG PO TABS: 5.0000 mg | ORAL_TABLET | Freq: Every day | ORAL | 11 refills | Status: DC | PRN

## 2016-07-19 MED ORDER — VARDENAFIL HCL 20 MG PO TABS: 20.0000 mg | ORAL_TABLET | Freq: Every day | ORAL | 11 refills | Status: DC | PRN

## 2016-07-19 NOTE — Progress Notes (Signed)
Pre visit review using our clinic review tool, if applicable. No additional management support is needed unless otherwise documented below in the visit note. 

## 2016-07-19 NOTE — Progress Notes (Signed)
Subjective:  Patient ID: Rick Edwards, male    DOB: Jan 05, 1956  Age: 60 y.o. MRN: [id number]  CC: Hypertension   HPI Rick Edwards presents for follow up - he tells me that his blood pressure has been well controlled. He complains that his hearing aids are not working and he thinks he has wax in his ears. He wants to have the wax removed. He also complains of erectile dysfunction and wants to try a medication to treat it.  Outpatient Medications Prior to Visit  Medication Sig Dispense Refill  . amLODipine (NORVASC) 10 MG tablet Take 10 mg by mouth at bedtime.     . carvedilol (COREG) 25 MG tablet Take 1 tablet (25 mg total) by mouth 2 (two) times daily. 180 tablet 3  . chlorthalidone (HYGROTON) 25 MG tablet Take 25 mg by mouth every morning.    Marland Kitchen omega-3 acid ethyl esters (LOVAZA) 1 g capsule Take 2 capsules (2 g total) by mouth 2 (two) times daily. 120 capsule 11  . potassium chloride SA (K-DUR,KLOR-CON) 20 MEQ tablet Take 1 tablet (20 mEq total) by mouth daily. 90 tablet 1  . spironolactone (ALDACTONE) 25 MG tablet Take 25 mg by mouth every morning.     . warfarin (COUMADIN) 5 MG tablet TAKE AS DIRECTED BY  ANTICOAGULATION  CLINIC 40 tablet 3   No facility-administered medications prior to visit.     ROS Review of Systems  Constitutional: Negative.   HENT: Positive for hearing loss. Negative for ear discharge, ear pain, sinus pressure and trouble swallowing.   Eyes: Negative for visual disturbance.  Respiratory: Negative for cough, chest tightness, shortness of breath and stridor.   Cardiovascular: Negative.  Negative for chest pain, palpitations and leg swelling.  Gastrointestinal: Negative for abdominal pain.  Genitourinary: Negative for difficulty urinating.  Allergic/Immunologic: Negative.   Neurological: Negative.   Hematological: Negative.  Negative for adenopathy. Does not bruise/bleed easily.  Psychiatric/Behavioral: Negative.     Objective:  BP 124/80 (BP  Location: Left Arm, Patient Position: Sitting, Cuff Size: Large)   Pulse 67   Temp 97.3 F (36.3 C) (Oral)   Resp 16   Ht 5\' 9"  (1.753 m)   Wt 205 lb 4 oz (93.1 kg)   SpO2 97%   BMI 30.31 kg/m   BP Readings from Last 3 Encounters:  07/19/16 124/80  04/05/16 126/80  03/23/16 119/83    Wt Readings from Last 3 Encounters:  07/19/16 205 lb 4 oz (93.1 kg)  04/05/16 209 lb (94.8 kg)  01/31/16 202 lb (91.6 kg)    Physical Exam  Constitutional: He is oriented to person, place, and time. No distress.  HENT:  Right Ear: Hearing, tympanic membrane and external ear normal. A foreign body (cerumen impaction) is present.  Left Ear: Hearing, tympanic membrane and external ear normal. A foreign body (cerumen impaction) is present.  Mouth/Throat: Oropharynx is clear and moist. No oropharyngeal exudate.  I put Colace in both external auditory canals and then I used an ear pick and irrigation system to remove the cerumen. A significant amount of cerumen was successfully removed. Examination of the external auditory canals afterwards is normal. He tolerated this well.  Eyes: Conjunctivae are normal. Right eye exhibits no discharge. Left eye exhibits no discharge. No scleral icterus.  Neck: Normal range of motion. Neck supple. No JVD present. No tracheal deviation present. No thyromegaly present.  Cardiovascular: Normal rate, regular rhythm, normal heart sounds and intact distal pulses.  Exam reveals  no gallop and no friction rub.   No murmur heard. Pulmonary/Chest: Effort normal and breath sounds normal. No stridor. No respiratory distress. He has no wheezes. He has no rales. He exhibits no tenderness.  Abdominal: Soft. Bowel sounds are normal. He exhibits no distension and no mass. There is no tenderness. There is no rebound and no guarding.  Musculoskeletal: Normal range of motion. He exhibits no edema, tenderness or deformity.  Lymphadenopathy:    He has no cervical adenopathy.  Neurological:  He is oriented to person, place, and time.  Skin: Skin is warm and dry. No rash noted. He is not diaphoretic. No erythema. No pallor.  Vitals reviewed.   Lab Results  Component Value Date   WBC 7.7 01/18/2016   HGB 15.2 01/18/2016   HCT 44.0 01/18/2016   PLT 423.0 (H) 01/18/2016   GLUCOSE 115 (H) 04/05/2016   CHOL 250 (H) 01/18/2016   TRIG 334.0 (H) 04/05/2016   HDL 45.60 01/18/2016   LDLDIRECT 112.0 01/18/2016   LDLCALC 137 (H) 09/08/2011   ALT 18 01/18/2016   AST 12 01/18/2016   NA 134 (A) 07/03/2016   K 3.8 07/03/2016   CL 97 04/05/2016   CREATININE 1.1 07/03/2016   BUN 20 07/03/2016   CO2 28 04/05/2016   TSH 1.49 01/18/2016   PSA 2.62 01/18/2016   INR 2.6 07/14/2016   HGBA1C 5.6 01/18/2016    No results found.  Assessment & Plan:   Rick Edwards was seen today for hypertension.  Diagnoses and all orders for this visit:  Thrombocytosis ([place])- I will recheck his platelet count and other blood indices and we'll screen for B12 deficiency -     CBC with Differential/Platelet; Future -     Vitamin B12; Future -     Folate; Future  Crohn's disease of large intestine without complication ([place])- we'll monitor him for anemia and will check for B12 and folate deficiency. -     CBC with Differential/Platelet; Future -     Vitamin B12; Future -     Folate; Future  Drug-induced erectile dysfunction- he will try Levitra for treatment of the erectile dysfunction -     tadalafil (CIALIS) 5 MG tablet; Take 1 tablet (5 mg total) by mouth daily as needed for erectile dysfunction. -     vardenafil (LEVITRA) 20 MG tablet; Take 1 tablet (20 mg total) by mouth daily as needed for erectile dysfunction.  Bilateral impacted cerumen- both cerumen impactions were successfully irrigated  Essential hypertension, malignant- his blood pressure is adequately well controlled   I am having Rick Edwards start on tadalafil and vardenafil. I am also having him maintain his spironolactone,  chlorthalidone, amLODipine, carvedilol, potassium chloride SA, warfarin, and omega-3 acid ethyl esters.  Meds ordered this encounter  Medications  . tadalafil (CIALIS) 5 MG tablet    Sig: Take 1 tablet (5 mg total) by mouth daily as needed for erectile dysfunction.    Dispense:  30 tablet    Refill:  11  . vardenafil (LEVITRA) 20 MG tablet    Sig: Take 1 tablet (20 mg total) by mouth daily as needed for erectile dysfunction.    Dispense:  10 tablet    Refill:  11    PLEASE CANCEL CIALIS RX     Follow-up: No Follow-up on file.  Scarlette Calico, MD

## 2016-08-15 ENCOUNTER — Telehealth: Payer: Self-pay

## 2016-08-15 NOTE — Telephone Encounter (Signed)
PA attempted but Name and DOB was not correct. Not able to initiate PA.   LVM for pt to call back as soon as possible.   RE: Medicare verification of pt dob. Patient to contact Medicare.

## 2016-08-17 ENCOUNTER — Telehealth: Payer: Self-pay | Admitting: Internal Medicine

## 2016-08-17 NOTE — Telephone Encounter (Signed)
Pt came in stating omaga 3 is going to cost him $200. Can we send in something cheaper or any recommendation about this med? Please call him back

## 2016-08-18 ENCOUNTER — Ambulatory Visit (INDEPENDENT_AMBULATORY_CARE_PROVIDER_SITE_OTHER): Payer: Medicare Other | Admitting: General Practice

## 2016-08-18 ENCOUNTER — Other Ambulatory Visit: Payer: Self-pay | Admitting: Internal Medicine

## 2016-08-18 DIAGNOSIS — E781 Pure hyperglyceridemia: Secondary | ICD-10-CM

## 2016-08-18 DIAGNOSIS — Z5181 Encounter for therapeutic drug level monitoring: Secondary | ICD-10-CM

## 2016-08-18 DIAGNOSIS — I743 Embolism and thrombosis of arteries of the lower extremities: Secondary | ICD-10-CM

## 2016-08-18 LAB — POCT INR: INR: 2.3

## 2016-08-18 NOTE — Patient Instructions (Signed)
Pre visit review using our clinic review tool, if applicable. No additional management support is needed unless otherwise documented below in the visit note. 

## 2016-08-18 NOTE — Progress Notes (Signed)
I have reviewed and agree with the plan. 

## 2016-08-24 NOTE — Telephone Encounter (Signed)
Initiated PA with correct insurance information (updated in EPIC)  New (Key: [id number])

## 2016-08-28 ENCOUNTER — Other Ambulatory Visit: Payer: Self-pay | Admitting: Internal Medicine

## 2016-08-28 DIAGNOSIS — E781 Pure hyperglyceridemia: Secondary | ICD-10-CM

## 2016-08-28 MED ORDER — FENOFIBRATE 145 MG PO TABS: 145.0000 mg | ORAL_TABLET | Freq: Every day | ORAL | 1 refills | Status: DC

## 2016-09-12 NOTE — Telephone Encounter (Signed)
Received fax that PA was approved.   Left message with pt father informing of same and to call pharmacy when he is ready to pick up.

## 2016-09-15 ENCOUNTER — Ambulatory Visit (INDEPENDENT_AMBULATORY_CARE_PROVIDER_SITE_OTHER): Payer: Medicare Other | Admitting: General Practice

## 2016-09-15 DIAGNOSIS — I743 Embolism and thrombosis of arteries of the lower extremities: Secondary | ICD-10-CM

## 2016-09-15 DIAGNOSIS — Z5181 Encounter for therapeutic drug level monitoring: Secondary | ICD-10-CM

## 2016-09-15 LAB — POCT INR: INR: 3.7

## 2016-09-15 NOTE — Patient Instructions (Signed)
Pre visit review using our clinic review tool, if applicable. No additional management support is needed unless otherwise documented below in the visit note. 

## 2016-09-15 NOTE — Progress Notes (Signed)
I have reviewed and agree with the plan. 

## 2016-09-30 ENCOUNTER — Other Ambulatory Visit: Payer: Self-pay | Admitting: Internal Medicine

## 2016-10-03 ENCOUNTER — Other Ambulatory Visit: Payer: Self-pay | Admitting: Internal Medicine

## 2016-10-13 ENCOUNTER — Ambulatory Visit (INDEPENDENT_AMBULATORY_CARE_PROVIDER_SITE_OTHER): Payer: Medicare Other | Admitting: General Practice

## 2016-10-13 DIAGNOSIS — I743 Embolism and thrombosis of arteries of the lower extremities: Secondary | ICD-10-CM

## 2016-10-13 DIAGNOSIS — Z5181 Encounter for therapeutic drug level monitoring: Secondary | ICD-10-CM | POA: Diagnosis not present

## 2016-10-13 LAB — POCT INR: INR: 3.2

## 2016-10-13 NOTE — Progress Notes (Signed)
I have reviewed and agree with the plan. 

## 2016-10-13 NOTE — Patient Instructions (Signed)
Pre visit review using our clinic review tool, if applicable. No additional management support is needed unless otherwise documented below in the visit note. 

## 2016-11-15 ENCOUNTER — Ambulatory Visit (INDEPENDENT_AMBULATORY_CARE_PROVIDER_SITE_OTHER): Payer: Medicare Other | Admitting: General Practice

## 2016-11-15 DIAGNOSIS — Z5181 Encounter for therapeutic drug level monitoring: Secondary | ICD-10-CM

## 2016-11-15 DIAGNOSIS — I743 Embolism and thrombosis of arteries of the lower extremities: Secondary | ICD-10-CM

## 2016-11-15 LAB — POCT INR: INR: 4.4

## 2016-11-15 NOTE — Progress Notes (Signed)
I have reviewed and agree with the plan. 

## 2016-11-15 NOTE — Patient Instructions (Signed)
Pre visit review using our clinic review tool, if applicable. No additional management support is needed unless otherwise documented below in the visit note. 

## 2016-11-29 ENCOUNTER — Ambulatory Visit (INDEPENDENT_AMBULATORY_CARE_PROVIDER_SITE_OTHER): Payer: Medicare Other | Admitting: General Practice

## 2016-11-29 DIAGNOSIS — Z5181 Encounter for therapeutic drug level monitoring: Secondary | ICD-10-CM | POA: Diagnosis not present

## 2016-11-29 DIAGNOSIS — I743 Embolism and thrombosis of arteries of the lower extremities: Secondary | ICD-10-CM

## 2016-11-29 LAB — POCT INR: INR: 4.2

## 2016-11-29 NOTE — Patient Instructions (Signed)
Pre visit review using our clinic review tool, if applicable. No additional management support is needed unless otherwise documented below in the visit note. 

## 2016-11-29 NOTE — Progress Notes (Signed)
I have reviewed and agree with the plan. 

## 2016-12-20 ENCOUNTER — Ambulatory Visit (INDEPENDENT_AMBULATORY_CARE_PROVIDER_SITE_OTHER): Payer: Medicare Other | Admitting: General Practice

## 2016-12-20 DIAGNOSIS — Z5181 Encounter for therapeutic drug level monitoring: Secondary | ICD-10-CM

## 2016-12-20 DIAGNOSIS — I743 Embolism and thrombosis of arteries of the lower extremities: Secondary | ICD-10-CM

## 2016-12-20 NOTE — Progress Notes (Signed)
I have reviewed and agree with the plan. 

## 2016-12-20 NOTE — Patient Instructions (Signed)
Pre visit review using our clinic review tool, if applicable. No additional management support is needed unless otherwise documented below in the visit note. 

## 2017-01-17 ENCOUNTER — Ambulatory Visit (INDEPENDENT_AMBULATORY_CARE_PROVIDER_SITE_OTHER): Payer: Medicare Other | Admitting: General Practice

## 2017-01-17 DIAGNOSIS — Z5181 Encounter for therapeutic drug level monitoring: Secondary | ICD-10-CM | POA: Diagnosis not present

## 2017-01-17 DIAGNOSIS — I743 Embolism and thrombosis of arteries of the lower extremities: Secondary | ICD-10-CM

## 2017-01-17 LAB — POCT INR: INR: 3.1

## 2017-01-17 NOTE — Patient Instructions (Signed)
Pre visit review using our clinic review tool, if applicable. No additional management support is needed unless otherwise documented below in the visit note. 

## 2017-01-17 NOTE — Progress Notes (Signed)
I have reviewed and agree with the plan. 

## 2017-02-14 ENCOUNTER — Ambulatory Visit (INDEPENDENT_AMBULATORY_CARE_PROVIDER_SITE_OTHER): Payer: Medicare Other | Admitting: General Practice

## 2017-02-14 DIAGNOSIS — I743 Embolism and thrombosis of arteries of the lower extremities: Secondary | ICD-10-CM | POA: Diagnosis not present

## 2017-02-14 DIAGNOSIS — Z5181 Encounter for therapeutic drug level monitoring: Secondary | ICD-10-CM | POA: Diagnosis not present

## 2017-02-14 LAB — POCT INR: INR: 2.9

## 2017-02-14 NOTE — Progress Notes (Signed)
I have reviewed and agree with the plan. 

## 2017-02-14 NOTE — Patient Instructions (Signed)
Pre visit review using our clinic review tool, if applicable. No additional management support is needed unless otherwise documented below in the visit note. 

## 2017-03-08 ENCOUNTER — Other Ambulatory Visit: Payer: Self-pay | Admitting: Internal Medicine

## 2017-03-10 ENCOUNTER — Other Ambulatory Visit: Payer: Self-pay | Admitting: Internal Medicine

## 2017-03-10 DIAGNOSIS — E781 Pure hyperglyceridemia: Secondary | ICD-10-CM

## 2017-03-14 ENCOUNTER — Ambulatory Visit (INDEPENDENT_AMBULATORY_CARE_PROVIDER_SITE_OTHER): Payer: Medicare Other | Admitting: General Practice

## 2017-03-14 DIAGNOSIS — Z5181 Encounter for therapeutic drug level monitoring: Secondary | ICD-10-CM | POA: Diagnosis not present

## 2017-03-14 DIAGNOSIS — I743 Embolism and thrombosis of arteries of the lower extremities: Secondary | ICD-10-CM

## 2017-03-14 LAB — POCT INR: INR: 3

## 2017-03-14 NOTE — Patient Instructions (Signed)
Pre visit review using our clinic review tool, if applicable. No additional management support is needed unless otherwise documented below in the visit note. 

## 2017-03-14 NOTE — Progress Notes (Signed)
I have reviewed and agree with the plan. 

## 2017-03-15 ENCOUNTER — Other Ambulatory Visit: Payer: Self-pay | Admitting: Internal Medicine

## 2017-03-15 DIAGNOSIS — E781 Pure hyperglyceridemia: Secondary | ICD-10-CM

## 2017-03-19 ENCOUNTER — Other Ambulatory Visit: Payer: Self-pay | Admitting: Internal Medicine

## 2017-03-19 DIAGNOSIS — E781 Pure hyperglyceridemia: Secondary | ICD-10-CM

## 2017-04-04 ENCOUNTER — Encounter (HOSPITAL_COMMUNITY): Payer: 59

## 2017-04-04 ENCOUNTER — Ambulatory Visit: Payer: 59 | Admitting: Vascular Surgery

## 2017-04-11 ENCOUNTER — Ambulatory Visit (INDEPENDENT_AMBULATORY_CARE_PROVIDER_SITE_OTHER): Payer: Medicare Other | Admitting: General Practice

## 2017-04-11 ENCOUNTER — Encounter (HOSPITAL_COMMUNITY): Payer: Medicare Other

## 2017-04-11 ENCOUNTER — Ambulatory Visit: Payer: Self-pay | Admitting: Vascular Surgery

## 2017-04-11 DIAGNOSIS — I743 Embolism and thrombosis of arteries of the lower extremities: Secondary | ICD-10-CM | POA: Diagnosis not present

## 2017-04-11 DIAGNOSIS — Z5181 Encounter for therapeutic drug level monitoring: Secondary | ICD-10-CM

## 2017-04-11 LAB — POCT INR: INR: 2

## 2017-04-11 NOTE — Patient Instructions (Signed)
Pre visit review using our clinic review tool, if applicable. No additional management support is needed unless otherwise documented below in the visit note. 

## 2017-05-16 ENCOUNTER — Ambulatory Visit (HOSPITAL_COMMUNITY)
Admission: RE | Admit: 2017-05-16 | Discharge: 2017-05-16 | Disposition: A | Discharge status: Home / Self Care | Payer: Medicare Other | Source: Ambulatory Visit | Attending: Vascular Surgery | Admitting: Vascular Surgery

## 2017-05-16 ENCOUNTER — Encounter: Payer: Self-pay | Admitting: Vascular Surgery

## 2017-05-16 ENCOUNTER — Ambulatory Visit (INDEPENDENT_AMBULATORY_CARE_PROVIDER_SITE_OTHER): Payer: Medicare Other | Admitting: Vascular Surgery

## 2017-05-16 VITALS — BP 131/87 | HR 85 | Temp 97.7°F | Resp 16 | Ht 69.0 in | Wt 200.0 lb

## 2017-05-16 DIAGNOSIS — I743 Embolism and thrombosis of arteries of the lower extremities: Secondary | ICD-10-CM

## 2017-05-16 DIAGNOSIS — I70209 Unspecified atherosclerosis of native arteries of extremities, unspecified extremity: Secondary | ICD-10-CM | POA: Insufficient documentation

## 2017-05-16 DIAGNOSIS — I739 Peripheral vascular disease, unspecified: Secondary | ICD-10-CM | POA: Diagnosis not present

## 2017-05-16 NOTE — Progress Notes (Signed)
Patient name: Rick Edwards MRN: [id number] DOB: 1956-06-13 Sex: male  REASON FOR VISIT:    Follow up of peripheral vascular disease.  HPI:   Rick Edwards is a pleasant 61 y.o. male who is status post right popliteal and tibial embolectomy and patch angioplasty in August 2013. He was found to have an anti-thrombin 3 deficiency and has been on Coumadin. He ultimately required a transmetatarsal amputation of the right foot also in 2013. I last saw him on 09/24/2013. At that time, he had evidence of tibial artery occlusive disease on exam. He was asymptomatic however. I extended his follow up after 18 months.  Since I saw him last, he denies any claudication, rest pain, or nonhealing ulcers. He is trying to obtain a new TMA insert for his right shoe. Apparently this was going to cost $500. Hopefully this could be uncovered with insurance and I have written him a prescription.  There have been no other significant changes in his medical history.  Past Medical History:  Diagnosis Date  . Anemia    anemia thrombocytopenia, saw Hematology 2011, can not r/o myeloproliferative d/c  . Antithrombin III deficiency ([place]) 05/16/2013   On coumadin for this  . Blood clot in vein    in right leg  . Chronic kidney disease 05/27/2013   ACUTE RENAL FAILURE   . Crohn's disease ([place])    tx. Imuran  . GI bleed 6/11   w/ normal EGD 6/11, 3 PRBCs   . Hearing loss    wears hearing aid left ear; birthed with hearing loss  . HOH (hard of hearing) 05/16/2013  . Hypercalcemia   . Hyperkalemia 05/27/2013  . Hypertension   . Ileostomy in place [facility]) 04-11-13   04-18-13 ileostomy to be taken down.  . Perianal abscess 2001   s/p right hemicolectomy, and drainage of retroperitoneal abcess 2003  . Peripheral vascular disease ([place])   . Transfusion history    last 8'13    Family History  Problem Relation Age of Onset  . Hypertension Mother   . Hyperlipidemia Mother   . Hypertension Father   .  Hyperlipidemia Father   . Heart disease Father   . Stroke Unknown        GF, aunts   . Hypertension Unknown        "the whole family"  . Coronary artery disease Neg Hx   . Diabetes Neg Hx   . Colon cancer Neg Hx   . Prostate cancer Neg Hx   . Kidney failure Neg Hx     SOCIAL HISTORY: Social History  Substance Use Topics  . Smoking status: Former Smoker    Packs/day: 0.25    Years: 9.00    Types: Cigarettes    Quit date: 05/09/2011  . Smokeless tobacco: Never Used  . Alcohol use No    Allergies  Allergen Reactions  . Mesalamine Rash    REACTION: Rash    Current Outpatient Prescriptions  Medication Sig Dispense Refill  . amLODipine (NORVASC) 10 MG tablet Take 10 mg by mouth at bedtime.     . carvedilol (COREG) 25 MG tablet Take 1 tablet (25 mg total) by mouth 2 (two) times daily. 180 tablet 3  . chlorthalidone (HYGROTON) 25 MG tablet Take 25 mg by mouth every morning.    . fenofibrate (TRICOR) 145 MG tablet TAKE ONE TABLET BY MOUTH ONCE DAILY 90 tablet 0  . potassium chloride SA (K-DUR,KLOR-CON) 20 MEQ tablet Take 1 tablet (20  mEq total) by mouth daily. 90 tablet 1  . spironolactone (ALDACTONE) 25 MG tablet Take 25 mg by mouth every morning.     . warfarin (COUMADIN) 5 MG tablet TAKE 1 TABLET (5 MG TOTAL) BY MOUTH SEE ADMIN INSTRUCTIONS. 45 tablet 3   No current facility-administered medications for this visit.     REVIEW OF SYSTEMS:  [X]  denotes positive finding, [ ]  denotes negative finding Cardiac  Comments:  Chest pain or chest pressure:    Shortness of breath upon exertion:    Short of breath when lying flat:    Irregular heart rhythm:        Vascular    Pain in calf, thigh, or hip brought on by ambulation:    Pain in feet at night that wakes you up from your sleep:     Blood clot in your veins:    Leg swelling:         Pulmonary    Oxygen at home:    Productive cough:     Wheezing:         Neurologic    Sudden weakness in arms or legs:     Sudden  numbness in arms or legs:     Sudden onset of difficulty speaking or slurred speech:    Temporary loss of vision in one eye:     Problems with dizziness:         Gastrointestinal    Blood in stool:     Vomited blood:         Genitourinary    Burning when urinating:     Blood in urine:        Psychiatric    Major depression:         Hematologic    Bleeding problems:    Problems with blood clotting too easily:        Skin    Rashes or ulcers:        Constitutional    Fever or chills:     PHYSICAL EXAM:   Vitals:   05/16/17 1408  BP: 131/87  Pulse: 85  Resp: 16  Temp: 97.7 F (36.5 C)  TempSrc: Oral  SpO2: 93%  Weight: 200 lb (90.7 kg)  Height: 5\' 9"  (1.753 m)    GENERAL: The patient is a well-nourished male, in no acute distress. The vital signs are documented above. CARDIAC: There is a regular rate and rhythm.  VASCULAR: I do not detect carotid bruits. On the right side he has a palpable femoral pulse. I cannot palpate pedal pulses however the right TMA is warm and well-perfused. On the left side he has a palpable femoral, dorsalis pedis, and posterior tibial pulse. He has no significant lower extremity swelling. PULMONARY: There is good air exchange bilaterally without wheezing or rales. ABDOMEN: Soft and non-tender with normal pitched bowel sounds.  MUSCULOSKELETAL: He has a right transmetatarsal amputation. NEUROLOGIC: No focal weakness or paresthesias are detected. SKIN: There are no ulcers or rashes noted. PSYCHIATRIC: The patient has a normal affect.  DATA:    Bilateral lower extremity arterial Doppler study: I have independently interpreted his Doppler study.  On the right side he has a triphasic posterior tibial signal with a biphasic dorsalis pedis signal. ABI is 100%.  On the left side, he has a triphasic dorsalis pedis and posterior tibial signal. ABI on the left is 100% with a toe pressure of 127 mmHg.  MEDICAL ISSUES:   STATUS POST RIGHT  POPLITEAL AND  TIBIAL EMBOLECTOMY AND VEIN PATCH ANGIOPLASTY: He has normal flow in the right foot and his transmetatarsal amputation site is healed. I have written him a prescription for an insert for his right shoe for his transmetatarsal amputation. Fortunately, he is not a smoker. I have encouraged him to stay as active as possible. We have also discussed the importance of nutrition. I have ordered follow up ABI's in 2 years and I will see him back at that time.  ANTI-THROMBIN 3 DEFICIENCY: He will be maintained on chronic Coumadin therapy.  Deitra Mayo [facility] [phone]

## 2017-05-18 DIAGNOSIS — H2513 Age-related nuclear cataract, bilateral: Secondary | ICD-10-CM | POA: Diagnosis not present

## 2017-05-22 NOTE — Addendum Note (Signed)
Addended by: Lianne Cure A on: 05/22/2017 04:39 PM   Modules accepted: Orders

## 2017-05-23 ENCOUNTER — Ambulatory Visit (INDEPENDENT_AMBULATORY_CARE_PROVIDER_SITE_OTHER): Payer: Medicare Other | Admitting: General Practice

## 2017-05-23 DIAGNOSIS — Z5181 Encounter for therapeutic drug level monitoring: Secondary | ICD-10-CM

## 2017-05-23 DIAGNOSIS — I743 Embolism and thrombosis of arteries of the lower extremities: Secondary | ICD-10-CM | POA: Diagnosis not present

## 2017-05-23 DIAGNOSIS — Z7901 Long term (current) use of anticoagulants: Secondary | ICD-10-CM | POA: Diagnosis not present

## 2017-05-23 DIAGNOSIS — Z23 Encounter for immunization: Secondary | ICD-10-CM | POA: Diagnosis not present

## 2017-05-23 LAB — POCT INR: INR: 2.6

## 2017-05-23 NOTE — Patient Instructions (Signed)
Pre visit review using our clinic review tool, if applicable. No additional management support is needed unless otherwise documented below in the visit note. 

## 2017-06-20 ENCOUNTER — Ambulatory Visit (INDEPENDENT_AMBULATORY_CARE_PROVIDER_SITE_OTHER): Payer: Medicare Other | Admitting: General Practice

## 2017-06-20 DIAGNOSIS — I743 Embolism and thrombosis of arteries of the lower extremities: Secondary | ICD-10-CM

## 2017-06-20 DIAGNOSIS — Z7901 Long term (current) use of anticoagulants: Secondary | ICD-10-CM | POA: Diagnosis not present

## 2017-06-20 LAB — POCT INR: INR: 3.2

## 2017-06-20 NOTE — Patient Instructions (Addendum)
Pre visit review using our clinic review tool, if applicable. No additional management support is needed unless otherwise documented below in the visit note.  Skip coumadin today (11/14) and then continue to take 1 tablet all days.  Re-check in 4 weeks.

## 2017-06-23 ENCOUNTER — Other Ambulatory Visit: Payer: Self-pay | Admitting: Internal Medicine

## 2017-06-23 DIAGNOSIS — E781 Pure hyperglyceridemia: Secondary | ICD-10-CM

## 2017-07-11 LAB — BASIC METABOLIC PANEL
BUN: 16 (ref 4–21)
CREATININE: 1.3 (ref 0.6–1.3)
Glucose: 100
Potassium: 3.8 (ref 3.4–5.3)
SODIUM: 137 (ref 137–147)

## 2017-07-18 ENCOUNTER — Other Ambulatory Visit: Payer: Medicare Other

## 2017-07-20 ENCOUNTER — Ambulatory Visit (INDEPENDENT_AMBULATORY_CARE_PROVIDER_SITE_OTHER): Payer: Medicare Other | Admitting: General Practice

## 2017-07-20 DIAGNOSIS — I743 Embolism and thrombosis of arteries of the lower extremities: Secondary | ICD-10-CM

## 2017-07-20 DIAGNOSIS — Z7901 Long term (current) use of anticoagulants: Secondary | ICD-10-CM

## 2017-07-20 LAB — POCT INR: INR: 2.1

## 2017-07-20 NOTE — Patient Instructions (Addendum)
Pre visit review using our clinic review tool, if applicable. No additional management support is needed unless otherwise documented below in the visit note.  Continue to take 1 tablet all days.  Re-check in 4 weeks.  

## 2017-08-06 DIAGNOSIS — I10 Essential (primary) hypertension: Secondary | ICD-10-CM | POA: Diagnosis not present

## 2017-08-06 LAB — BASIC METABOLIC PANEL
BUN: 16 (ref 4–21)
CREATININE: 1.3 (ref 0.6–1.3)
Glucose: 100
Potassium: 3.8 (ref 3.4–5.3)
SODIUM: 137 (ref 137–147)

## 2017-08-14 DIAGNOSIS — Z6830 Body mass index (BMI) 30.0-30.9, adult: Secondary | ICD-10-CM | POA: Diagnosis not present

## 2017-08-14 DIAGNOSIS — I10 Essential (primary) hypertension: Secondary | ICD-10-CM | POA: Diagnosis not present

## 2017-08-16 ENCOUNTER — Other Ambulatory Visit: Payer: Self-pay | Admitting: Internal Medicine

## 2017-08-17 ENCOUNTER — Ambulatory Visit (INDEPENDENT_AMBULATORY_CARE_PROVIDER_SITE_OTHER): Payer: Medicare Other | Admitting: General Practice

## 2017-08-17 DIAGNOSIS — Z7901 Long term (current) use of anticoagulants: Secondary | ICD-10-CM

## 2017-08-17 DIAGNOSIS — I743 Embolism and thrombosis of arteries of the lower extremities: Secondary | ICD-10-CM

## 2017-08-17 LAB — POCT INR: INR: 2.5

## 2017-08-17 NOTE — Patient Instructions (Addendum)
Pre visit review using our clinic review tool, if applicable. No additional management support is needed unless otherwise documented below in the visit note.  Continue to take 1 tablet all days.  Re-check in 4 weeks.  

## 2017-08-23 ENCOUNTER — Telehealth: Payer: Self-pay | Admitting: Internal Medicine

## 2017-08-23 NOTE — Telephone Encounter (Signed)
LVM to inform patient his handicap placard has been completed and it is up front for pick up.   Copy sent to scan.

## 2017-09-07 ENCOUNTER — Encounter: Payer: Self-pay | Admitting: Internal Medicine

## 2017-09-07 NOTE — Progress Notes (Signed)
Results abstracted and sent to scan.  

## 2017-09-14 ENCOUNTER — Ambulatory Visit (INDEPENDENT_AMBULATORY_CARE_PROVIDER_SITE_OTHER): Payer: Medicare Other | Admitting: General Practice

## 2017-09-14 DIAGNOSIS — Z7901 Long term (current) use of anticoagulants: Secondary | ICD-10-CM | POA: Diagnosis not present

## 2017-09-14 DIAGNOSIS — I743 Embolism and thrombosis of arteries of the lower extremities: Secondary | ICD-10-CM | POA: Diagnosis not present

## 2017-09-14 LAB — POCT INR: INR: 3

## 2017-09-14 NOTE — Patient Instructions (Addendum)
Pre visit review using our clinic review tool, if applicable. No additional management support is needed unless otherwise documented below in the visit note.  Continue to take 1 tablet all days.  Re-check in 4 weeks.  

## 2017-09-20 ENCOUNTER — Encounter: Payer: Self-pay | Admitting: Internal Medicine

## 2017-09-20 NOTE — Progress Notes (Signed)
Result abstracted and sent to scan ° °

## 2017-09-25 ENCOUNTER — Other Ambulatory Visit (INDEPENDENT_AMBULATORY_CARE_PROVIDER_SITE_OTHER): Payer: Medicare Other

## 2017-09-25 ENCOUNTER — Encounter: Payer: Self-pay | Admitting: Internal Medicine

## 2017-09-25 ENCOUNTER — Ambulatory Visit (INDEPENDENT_AMBULATORY_CARE_PROVIDER_SITE_OTHER): Payer: Medicare Other | Admitting: Internal Medicine

## 2017-09-25 ENCOUNTER — Ambulatory Visit (INDEPENDENT_AMBULATORY_CARE_PROVIDER_SITE_OTHER)
Admission: RE | Admit: 2017-09-25 | Discharge: 2017-09-25 | Disposition: A | Discharge status: Home / Self Care | Payer: Medicare Other | Source: Ambulatory Visit | Attending: Internal Medicine | Admitting: Internal Medicine

## 2017-09-25 VITALS — BP 120/76 | HR 71 | Temp 98.7°F | Resp 16 | Ht 69.0 in | Wt 210.0 lb

## 2017-09-25 DIAGNOSIS — M545 Low back pain, unspecified: Secondary | ICD-10-CM | POA: Insufficient documentation

## 2017-09-25 DIAGNOSIS — N400 Enlarged prostate without lower urinary tract symptoms: Secondary | ICD-10-CM

## 2017-09-25 DIAGNOSIS — N183 Chronic kidney disease, stage 3 unspecified: Secondary | ICD-10-CM

## 2017-09-25 DIAGNOSIS — D473 Essential (hemorrhagic) thrombocythemia: Secondary | ICD-10-CM

## 2017-09-25 DIAGNOSIS — E785 Hyperlipidemia, unspecified: Secondary | ICD-10-CM

## 2017-09-25 DIAGNOSIS — I700 Atherosclerosis of aorta: Secondary | ICD-10-CM

## 2017-09-25 DIAGNOSIS — R10814 Left lower quadrant abdominal tenderness: Secondary | ICD-10-CM | POA: Diagnosis not present

## 2017-09-25 DIAGNOSIS — R739 Hyperglycemia, unspecified: Secondary | ICD-10-CM

## 2017-09-25 DIAGNOSIS — D75839 Thrombocytosis, unspecified: Secondary | ICD-10-CM

## 2017-09-25 DIAGNOSIS — E781 Pure hyperglyceridemia: Secondary | ICD-10-CM

## 2017-09-25 DIAGNOSIS — I743 Embolism and thrombosis of arteries of the lower extremities: Secondary | ICD-10-CM | POA: Diagnosis not present

## 2017-09-25 LAB — COMPREHENSIVE METABOLIC PANEL
ALBUMIN: 4.3 g/dL (ref 3.5–5.2)
ALT: 14 U/L (ref 0–53)
AST: 11 U/L (ref 0–37)
Alkaline Phosphatase: 42 U/L (ref 39–117)
BUN: 17 mg/dL (ref 6–23)
CHLORIDE: 98 meq/L (ref 96–112)
CO2: 33 meq/L — AB (ref 19–32)
CREATININE: 1.26 mg/dL (ref 0.40–1.50)
Calcium: 10 mg/dL (ref 8.4–10.5)
GFR: 75.26 mL/min (ref >60.00)
GLUCOSE: 106 mg/dL — AB (ref 70–99)
Potassium: 4.1 mEq/L (ref 3.5–5.1)
SODIUM: 136 meq/L (ref 135–145)
Total Bilirubin: 0.6 mg/dL (ref 0.2–1.2)
Total Protein: 7.2 g/dL (ref 6.0–8.3)

## 2017-09-25 LAB — URINALYSIS, ROUTINE W REFLEX MICROSCOPIC
BILIRUBIN URINE: NEGATIVE
Hgb urine dipstick: NEGATIVE
KETONES UR: NEGATIVE
LEUKOCYTES UA: NEGATIVE
NITRITE: NEGATIVE
RBC / HPF: NONE SEEN (ref 0–?)
Specific Gravity, Urine: 1.015 (ref 1.000–1.030)
Total Protein, Urine: NEGATIVE
UROBILINOGEN UA: 0.2 (ref 0.0–1.0)
Urine Glucose: NEGATIVE
WBC, UA: NONE SEEN (ref 0–?)
pH: 7 (ref 5.0–8.0)

## 2017-09-25 LAB — IBC PANEL
IRON: 79 ug/dL (ref 42–165)
SATURATION RATIOS: 18 % — AB (ref 20.0–50.0)
Transferrin: 314 mg/dL (ref 212.0–360.0)

## 2017-09-25 LAB — LIPID PANEL
CHOLESTEROL: 246 mg/dL — AB (ref 0–200)
HDL: 36 mg/dL — ABNORMAL LOW (ref >39.00)
LDL Cholesterol: 173 mg/dL — ABNORMAL HIGH (ref 0–99)
NonHDL: 210.38
TRIGLYCERIDES: 187 mg/dL — AB (ref 0.0–149.0)
Total CHOL/HDL Ratio: 7
VLDL: 37.4 mg/dL (ref 0.0–40.0)

## 2017-09-25 LAB — CBC WITH DIFFERENTIAL/PLATELET
BASOS PCT: 1.2 % (ref 0.0–3.0)
Basophils Absolute: 0.1 10*3/uL (ref 0.0–0.1)
EOS ABS: 0.2 10*3/uL (ref 0.0–0.7)
Eosinophils Relative: 2.6 % (ref 0.0–5.0)
HCT: 44.6 % (ref 39.0–52.0)
HEMOGLOBIN: 15.3 g/dL (ref 13.0–17.0)
LYMPHS ABS: 1.8 10*3/uL (ref 0.7–4.0)
Lymphocytes Relative: 27.6 % (ref 12.0–46.0)
MCHC: 34.3 g/dL (ref 30.0–36.0)
MCV: 92 fl (ref 78.0–100.0)
MONO ABS: 0.7 10*3/uL (ref 0.1–1.0)
Monocytes Relative: 11 % (ref 3.0–12.0)
NEUTROS PCT: 57.6 % (ref 43.0–77.0)
Neutro Abs: 3.8 10*3/uL (ref 1.4–7.7)
Platelets: 438 10*3/uL — ABNORMAL HIGH (ref 150.0–400.0)
RBC: 4.85 Mil/uL (ref 4.22–5.81)
RDW: 13.8 % (ref 11.5–15.5)
WBC: 6.5 10*3/uL (ref 4.0–10.5)

## 2017-09-25 LAB — PSA: PSA: 1.49 ng/mL (ref 0.10–4.00)

## 2017-09-25 LAB — LIPASE: Lipase: 19 U/L (ref 11.0–59.0)

## 2017-09-25 LAB — SEDIMENTATION RATE: Sed Rate: 9 mm/hr (ref 0–20)

## 2017-09-25 LAB — AMYLASE: Amylase: 79 U/L (ref 27–131)

## 2017-09-25 LAB — HEMOGLOBIN A1C: Hgb A1c MFr Bld: 5.9 % (ref 4.6–6.5)

## 2017-09-25 LAB — FERRITIN: FERRITIN: 272.7 ng/mL (ref 22.0–322.0)

## 2017-09-25 MED ORDER — ATORVASTATIN CALCIUM 20 MG PO TABS: 20.0000 mg | ORAL_TABLET | Freq: Every day | ORAL | 1 refills | Status: DC

## 2017-09-25 NOTE — Patient Instructions (Signed)

## 2017-09-25 NOTE — Progress Notes (Signed)
Subjective:  Patient ID: Rick Edwards, male    DOB: 1955/11/04  Age: 62 y.o. MRN: [id number]  CC: Abdominal Pain and Back Pain   HPI Rick Edwards presents for concerns about a 2-week history of lower back pain.  He denies any recent trauma or injury.  He says the back pain does not radiate into his lower extremities and he denies paresthesias.  He describes it as an intermittent aching sensation that does not require treatment with a medication.  He also complains of intermittent episodes of throbbing abdominal pain.  The pain is localized to the left lower abdomen.  He denies nausea, vomiting, loss of appetite, diarrhea, constipation, blood in his stool, dysuria, or hematuria.  He tells me that his appetite has been good.  Outpatient Medications Prior to Visit  Medication Sig Dispense Refill  . amLODipine (NORVASC) 10 MG tablet Take 10 mg by mouth at bedtime.     . carvedilol (COREG) 25 MG tablet Take 1 tablet (25 mg total) by mouth 2 (two) times daily. 180 tablet 3  . chlorthalidone (HYGROTON) 25 MG tablet Take 25 mg by mouth every morning.    . fenofibrate (TRICOR) 145 MG tablet TAKE 1 TABLET BY MOUTH ONCE DAILY 90 tablet 0  . spironolactone (ALDACTONE) 25 MG tablet Take 25 mg by mouth every morning.     . warfarin (COUMADIN) 5 MG tablet TAKE 1 TABLET (5 MG TOTAL) BY MOUTH SEE ADMIN INSTRUCTIONS. 45 tablet 3  . potassium chloride SA (K-DUR,KLOR-CON) 20 MEQ tablet Take 1 tablet (20 mEq total) by mouth daily. 90 tablet 1  . warfarin (COUMADIN) 5 MG tablet TAKE AS DIRECTED BY  ANTICOAGULATION  CLINIC 40 tablet 3   No facility-administered medications prior to visit.     ROS Review of Systems  Constitutional: Negative for appetite change, chills, diaphoresis, fatigue and fever.  HENT: Negative.  Negative for sore throat.   Eyes: Negative for visual disturbance.  Respiratory: Negative for cough, chest tightness, shortness of breath and wheezing.   Cardiovascular: Negative for  chest pain, palpitations and leg swelling.  Gastrointestinal: Positive for abdominal pain. Negative for abdominal distention, anal bleeding, constipation, diarrhea, nausea and vomiting.  Endocrine: Negative.   Genitourinary: Negative.  Negative for difficulty urinating, dysuria, flank pain, frequency, hematuria, penile swelling, scrotal swelling, testicular pain and urgency.  Musculoskeletal: Positive for back pain. Negative for myalgias and neck pain.  Skin: Negative.  Negative for color change and pallor.  Allergic/Immunologic: Negative.   Neurological: Negative.  Negative for dizziness, weakness and light-headedness.  Hematological: Negative for adenopathy. Does not bruise/bleed easily.  Psychiatric/Behavioral: Negative.     Objective:  BP 120/76 (BP Location: Right Arm, Patient Position: Sitting, Cuff Size: Large)   Pulse 71   Temp 98.7 F (37.1 C) (Oral)   Resp 16   Ht 5\' 9"  (1.753 m)   Wt 210 lb 0.6 oz (95.3 kg)   SpO2 96%   BMI 31.02 kg/m   BP Readings from Last 3 Encounters:  09/25/17 120/76  05/16/17 131/87  07/19/16 124/80    Wt Readings from Last 3 Encounters:  09/25/17 210 lb 0.6 oz (95.3 kg)  05/16/17 200 lb (90.7 kg)  07/19/16 205 lb 4 oz (93.1 kg)    Physical Exam  Constitutional: He is oriented to person, place, and time. No distress.  HENT:  Mouth/Throat: Oropharynx is clear and moist. No oropharyngeal exudate.  Eyes: Conjunctivae are normal. Left eye exhibits no discharge. No scleral icterus.  Neck: Normal range of motion. Neck supple. No JVD present. No thyromegaly present.  Cardiovascular: Normal rate, regular rhythm and normal heart sounds. Exam reveals no gallop and no friction rub.  No murmur heard. Pulmonary/Chest: Effort normal and breath sounds normal. No respiratory distress. He has no wheezes. He has no rales.  Abdominal: Soft. Normal appearance and bowel sounds are normal. He exhibits no distension, no ascites and no mass. There is no  hepatosplenomegaly, splenomegaly or hepatomegaly. There is tenderness in the left lower quadrant. There is no rigidity, no rebound, no guarding, no CVA tenderness, no tenderness at McBurney's point and negative Murphy's sign. No hernia. Hernia confirmed negative in the ventral area, confirmed negative in the right inguinal area and confirmed negative in the left inguinal area.  Genitourinary: Testes normal and penis normal. Rectal exam shows no external hemorrhoid, no internal hemorrhoid, no fissure, no mass, no tenderness, anal tone normal and guaiac negative stool. Prostate is enlarged (1+ smooth symm BPH). Prostate is not tender. Right testis shows no mass, no swelling and no tenderness. Left testis shows no mass and no tenderness. Circumcised. No penile erythema or penile tenderness. No discharge found.  Musculoskeletal: Normal range of motion. He exhibits no edema, tenderness or deformity.  Lymphadenopathy:    He has no cervical adenopathy.       Right: No inguinal adenopathy present.       Left: No inguinal adenopathy present.  Neurological: He is alert and oriented to person, place, and time. He displays no atrophy, no tremor and normal reflexes. No cranial nerve deficit or sensory deficit. He exhibits normal muscle tone. He displays no seizure activity. Coordination and gait normal.  Neg SLR in BLE  Skin: Skin is warm and dry. No rash noted. He is not diaphoretic. No erythema. No pallor.  Psychiatric: He has a normal mood and affect. His behavior is normal. Judgment and thought content normal.  Vitals reviewed.   Lab Results  Component Value Date   WBC 6.5 09/25/2017   HGB 15.3 09/25/2017   HCT 44.6 09/25/2017   PLT 438.0 (H) 09/25/2017   GLUCOSE 106 (H) 09/25/2017   CHOL 246 (H) 09/25/2017   TRIG 187.0 (H) 09/25/2017   HDL 36.00 (L) 09/25/2017   LDLDIRECT 112.0 01/18/2016   LDLCALC 173 (H) 09/25/2017   ALT 14 09/25/2017   AST 11 09/25/2017   NA 136 09/25/2017   K 4.1 09/25/2017     CL 98 09/25/2017   CREATININE 1.26 09/25/2017   BUN 17 09/25/2017   CO2 33 (H) 09/25/2017   TSH 1.49 01/18/2016   PSA 1.49 09/25/2017   INR 3.0 09/14/2017   HGBA1C 5.9 09/25/2017    No results found.  Assessment & Plan:   Brentyn was seen today for abdominal pain and back pain.  Diagnoses and all orders for this visit:  Acute midline low back pain, with sciatica presence unspecified- He does not have any alarming symptoms.  His blood work is negative for significant pathology.  Plain films only show degenerative changes.  He is neurologically intact.  I have asked him to take Tylenol as needed for the discomfort. -     Sedimentation rate; Future -     DG Lumbar Spine Complete; Future  Left lower quadrant abdominal tenderness without rebound tenderness- He has had a several week history of left lower quadrant abdominal pain.  Examination today is not consistent with an acute abdominal process.  His lab work is negative for any organic, infectious,  or inflammatory process.  If his pain does not resolve soon then will consider ordering a with contrast. -     Comprehensive metabolic panel; Future -     CBC with Differential/Platelet; Future -     Urinalysis, Routine w reflex microscopic; Future -     Amylase; Future -     Lipase; Future -     Sedimentation rate; Future  Thrombocytosis ([place])- This is a stable finding for him.  His iron and ferritin levels are normal.  His insurance would not let me order a B12 level.  This appears to be a benign finding that needs to be monitored in the future for transformation to a lymphoproliferative process. -     IBC panel; Future -     Ferritin; Future  Hyperglycemia- He is prediabetic.  Medical therapy is not indicated. -     Lipid panel; Future -     Hemoglobin A1c; Future  Hyperlipidemia with target LDL less than 70- He has not achieved his LDL goal.  I have asked him to start taking a statin for CV risk reduction. -     Lipid panel;  Future -     atorvastatin (LIPITOR) 20 MG tablet; Take 1 tablet (20 mg total) by mouth daily.  Pure hyperglyceridemia- His triglycerides are mildly elevated at 187.  This does not require medical therapy.  Benign prostatic hyperplasia without lower urinary tract symptoms- His exam and PSA are reassuring that he does not have prostate cancer.  He has no symptoms that need to be treated. -     PSA; Future  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) -     atorvastatin (LIPITOR) 20 MG tablet; Take 1 tablet (20 mg total) by mouth daily.  Kidney disease, chronic, stage III (GFR 30-59 ml/min) ([place])- His renal function is normal now.  Atherosclerosis of aorta ([place])- This has been seen on his x-rays.  Will continue risk factor modifications.   I am having Delaney Meigs. Kujawa start on atorvastatin. I am also having him maintain his spironolactone, chlorthalidone, amLODipine, carvedilol, potassium chloride SA, warfarin, fenofibrate, and warfarin.  Meds ordered this encounter  Medications  . atorvastatin (LIPITOR) 20 MG tablet    Sig: Take 1 tablet (20 mg total) by mouth daily.    Dispense:  90 tablet    Refill:  1     Follow-up: Return in about 2 weeks (around 10/09/2017).  Scarlette Calico, MD

## 2017-09-26 DIAGNOSIS — I700 Atherosclerosis of aorta: Secondary | ICD-10-CM | POA: Insufficient documentation

## 2017-09-29 ENCOUNTER — Other Ambulatory Visit: Payer: Self-pay | Admitting: Internal Medicine

## 2017-09-29 DIAGNOSIS — E781 Pure hyperglyceridemia: Secondary | ICD-10-CM

## 2017-10-12 ENCOUNTER — Ambulatory Visit (INDEPENDENT_AMBULATORY_CARE_PROVIDER_SITE_OTHER): Payer: Medicare Other | Admitting: General Practice

## 2017-10-12 DIAGNOSIS — I743 Embolism and thrombosis of arteries of the lower extremities: Secondary | ICD-10-CM | POA: Diagnosis not present

## 2017-10-12 DIAGNOSIS — Z7901 Long term (current) use of anticoagulants: Secondary | ICD-10-CM

## 2017-10-12 NOTE — Patient Instructions (Signed)
Pre visit review using our clinic review tool, if applicable. No additional management support is needed unless otherwise documented below in the visit note. 

## 2017-10-16 ENCOUNTER — Ambulatory Visit (INDEPENDENT_AMBULATORY_CARE_PROVIDER_SITE_OTHER): Payer: Medicare Other | Admitting: General Practice

## 2017-10-16 DIAGNOSIS — I743 Embolism and thrombosis of arteries of the lower extremities: Secondary | ICD-10-CM

## 2017-10-16 DIAGNOSIS — Z7901 Long term (current) use of anticoagulants: Secondary | ICD-10-CM

## 2017-10-16 LAB — POCT INR: INR: 2.8

## 2017-10-16 NOTE — Patient Instructions (Addendum)
Pre visit review using our clinic review tool, if applicable. No additional management support is needed unless otherwise documented below in the visit note.  Continue to take 1 tablet all days.  Re-check in 4 weeks.  

## 2017-10-17 ENCOUNTER — Telehealth: Payer: Self-pay | Admitting: Internal Medicine

## 2017-10-17 ENCOUNTER — Telehealth: Payer: Self-pay

## 2017-10-17 NOTE — Telephone Encounter (Signed)
Routing to dr jones---patient is coumadin patient, INR yesterday within goal---patient's brother having birthday party and patient wants to know if it's ok to drink 2 beers at his party----please advise, I will call patient back at [phone]

## 2017-10-19 NOTE — Telephone Encounter (Addendum)
Per PCP - can NOT okay for pt to drink any amount of alcohol for any reason.   Called home number and pt sister was there. No up to date DPR on file.   Left detailed message on pt cell phone.

## 2017-10-29 ENCOUNTER — Encounter: Payer: Self-pay | Admitting: Internal Medicine

## 2017-10-29 ENCOUNTER — Ambulatory Visit (INDEPENDENT_AMBULATORY_CARE_PROVIDER_SITE_OTHER): Payer: Medicare Other | Admitting: Internal Medicine

## 2017-10-29 VITALS — BP 110/70 | HR 65 | Temp 98.3°F | Resp 16 | Ht 69.0 in | Wt 218.0 lb

## 2017-10-29 DIAGNOSIS — I743 Embolism and thrombosis of arteries of the lower extremities: Secondary | ICD-10-CM

## 2017-10-29 DIAGNOSIS — B079 Viral wart, unspecified: Secondary | ICD-10-CM

## 2017-10-29 NOTE — Progress Notes (Signed)
Subjective:  Patient ID: Rick Edwards, male    DOB: Dec 18, 1955  Age: 62 y.o. MRN: [id number]  CC: Genital Warts   HPI Rick Edwards presents for concerns about an asymptomatic lesion in his right groin that he has noticed for several months.  It is not painful, does not itch, and does not bleed.  Outpatient Medications Prior to Visit  Medication Sig Dispense Refill  . amLODipine (NORVASC) 10 MG tablet Take 10 mg by mouth at bedtime.     Marland Kitchen atorvastatin (LIPITOR) 20 MG tablet Take 1 tablet (20 mg total) by mouth daily. 90 tablet 1  . carvedilol (COREG) 25 MG tablet Take 1 tablet (25 mg total) by mouth 2 (two) times daily. 180 tablet 3  . chlorthalidone (HYGROTON) 25 MG tablet Take 25 mg by mouth every morning.    . fenofibrate (TRICOR) 145 MG tablet TAKE 1 TABLET BY MOUTH ONCE DAILY 90 tablet 0  . potassium chloride SA (K-DUR,KLOR-CON) 20 MEQ tablet Take 1 tablet (20 mEq total) by mouth daily. 90 tablet 1  . spironolactone (ALDACTONE) 25 MG tablet Take 25 mg by mouth every morning.     . warfarin (COUMADIN) 5 MG tablet TAKE 1 TABLET (5 MG TOTAL) BY MOUTH SEE ADMIN INSTRUCTIONS. 45 tablet 3  . warfarin (COUMADIN) 5 MG tablet TAKE AS DIRECTED BY  ANTICOAGULATION  CLINIC 40 tablet 3   No facility-administered medications prior to visit.     ROS Review of Systems  All other systems reviewed and are negative.   Objective:  BP 110/70 (BP Location: Left Arm, Patient Position: Sitting, Cuff Size: Large)   Pulse 65   Temp 98.3 F (36.8 C) (Oral)   Resp 16   Ht 5\' 9"  (1.753 m)   Wt 218 lb (98.9 kg)   SpO2 98%   BMI 32.19 kg/m   BP Readings from Last 3 Encounters:  10/29/17 110/70  09/25/17 120/76  05/16/17 131/87    Wt Readings from Last 3 Encounters:  10/29/17 218 lb (98.9 kg)  09/25/17 210 lb 0.6 oz (95.3 kg)  05/16/17 200 lb (90.7 kg)    Physical Exam  Abdominal: Hernia confirmed negative in the right inguinal area and confirmed negative in the left inguinal  area.  Genitourinary: Testes normal and penis normal. Right testis shows no mass, no swelling and no tenderness. Left testis shows no mass, no swelling and no tenderness. Circumcised. No penile tenderness. No discharge found.  Genitourinary Comments: The right lower groin reveals a fleshy, pedunculated, verrucous lesion.  It is attached with a stalk that measures about 6 mm and across the dome it measures about 1.5 cm.  The pigmentation is consistent with the rest of the skin.  Lymphadenopathy:       Right: No inguinal adenopathy present.       Left: No inguinal adenopathy present.    Lab Results  Component Value Date   WBC 6.5 09/25/2017   HGB 15.3 09/25/2017   HCT 44.6 09/25/2017   PLT 438.0 (H) 09/25/2017   GLUCOSE 106 (H) 09/25/2017   CHOL 246 (H) 09/25/2017   TRIG 187.0 (H) 09/25/2017   HDL 36.00 (L) 09/25/2017   LDLDIRECT 112.0 01/18/2016   LDLCALC 173 (H) 09/25/2017   ALT 14 09/25/2017   AST 11 09/25/2017   NA 136 09/25/2017   K 4.1 09/25/2017   CL 98 09/25/2017   CREATININE 1.26 09/25/2017   BUN 17 09/25/2017   CO2 33 (H) 09/25/2017   TSH  1.49 01/18/2016   PSA 1.49 09/25/2017   INR 2.8 10/16/2017   HGBA1C 5.9 09/25/2017    Dg Lumbar Spine Complete  Result Date: 09/25/2017 CLINICAL DATA:  62 year old male with lower back pain for 2 weeks. Lifted heavy objects. No known injury. Initial encounter. EXAM: LUMBAR SPINE - COMPLETE 4+ VIEW COMPARISON:  05/08/2013 CT abdomen and pelvis. FINDINGS: Normal alignment. Mild to moderate L5-S1 disc space narrowing most notable posteriorly. Mild L5-S1 facet degenerative changes.  No pars defect noted. Vascular calcifications. IMPRESSION: Mild to moderate L5-S1 disc space narrowing (most notable posteriorly). Aortic Atherosclerosis (ICD10-I70.0). Electronically Signed   By: Rick Edwards M.D.   On: 09/25/2017 17:12    Assessment & Plan:   Rick Edwards was seen today for genital warts.  Diagnoses and all orders for this  visit:  Verrucous skin lesion- This is a straightforward and probably benign lesion but I think it needs to be removed to be certain that there is not a malignant process.  He is fully anticoagulated with Coumadin so bleeding complications are a concern.  I have therefore asked him to see general surgery to have this removed under a controlled setting. -     Ambulatory referral to General Surgery   I am having Rick Edwards maintain his spironolactone, chlorthalidone, amLODipine, carvedilol, potassium chloride SA, warfarin, warfarin, atorvastatin, and fenofibrate.  No orders of the defined types were placed in this encounter.    Follow-up: Return if symptoms worsen or fail to improve.  Rick Calico, MD

## 2017-10-29 NOTE — Patient Instructions (Signed)
Genital Warts Genital warts are a common STD (sexually transmitted disease). They may appear as small bumps on the tissues of the genital area or anal area. Sometimes, they can become irritated and cause pain. Genital warts are easily passed to other people through sexual contact. Getting treatment is important because genital warts can lead to other problems. In females, the virus that causes genital warts may increase the risk of cervical cancer. What are the causes? Genital warts are caused by a virus that is called human papillomavirus (HPV). HPV is spread by having unprotected sex with an infected person. It can be spread through vaginal, anal, and oral sex. Many people do not know that they are infected. They may be infected for years without problems. However, even if they do not have problems, they can pass the infection to their sexual partners. What increases the risk? Genital warts are more likely to develop in:  People who have unprotected sex.  People who have multiple sexual partners.  People who become sexually active before they are 60 years of age.  Men who are not circumcised.  Women who have a male sexual partner who is not circumcised.  People who have a weakened body defense system (immune system) due to disease or medicine.  People who smoke.  What are the signs or symptoms? Symptoms of genital warts include:  Small growths in the genital area or anal area. These warts often grow in clusters.  Itching and irritation in the genital area or anal area.  Bleeding from the warts.  Painful sexual intercourse.  How is this diagnosed? Genital warts can usually be diagnosed from their appearance on the vagina, vulva, penis, perineum, anus, or rectum. Tests may also be done, such as:  Biopsy. A tissue sample is removed so it can be looked at under a microscope.  Colposcopy. In females, a magnifying tool is used to examine the vagina and cervix. Certain solutions may  be used to make the HPV cells change color so they can be seen more easily.  A Pap test in females.  Tests for other STDs.  How is this treated? Treatment for genital warts may include:  Applying prescription medicines to the warts. These may be solutions or creams.  Freezing the warts with liquid nitrogen (cryotherapy).  Burning the warts with: ? Laser treatment. ? An electrified probe (electrocautery).  Injecting a substance (Candida antigen or Trichophyton antigen) into the warts to help the body's immune system to fight off the warts.  Interferon injections.  Surgery to remove the warts.  Follow these instructions at home: Medicines  Apply over-the-counter and prescription medicines only as told by your health care provider.  Do not treat genital warts with medicines that are used for treating hand warts.  Talk with your health care provider about using over-the-counter anti-itch creams. General instructions  Do not touch or scratch the warts.  Do not have sex until your treatment has been completed.  Tell your current and past sexual partners about your condition because they may also need treatment.  Keep all follow-up visits as told by your health care provider. This is important.  After treatment, use condoms during sex to prevent future infections. Other Instructions for Women  Women who have genital warts might need increased screening for cervical cancer. This type of cancer is slow growing and can be cured if it is found early. Chances of developing cervical cancer are increased with HPV.  If you become pregnant, tell your health   care provider that you have had HPV. Your health care provider will monitor you closely during pregnancy to be sure that your baby is safe. How is this prevented? Talk with your health care provider about getting the HPV vaccines. These vaccines prevent some HPV infections and cancers. It is recommended that the vaccine be given to  males and females who are 9-26 years of age. It will not work if you already have HPV, and it is not recommended for pregnant women. Contact a health care provider if:  You have redness, swelling, or pain in the area of the treated skin.  You have a fever.  You feel generally ill.  You feel lumps in and around your genital area or anal area.  You have bleeding in your genital area or anal area.  You have pain during sexual intercourse. This information is not intended to replace advice given to you by your health care provider. Make sure you discuss any questions you have with your health care provider. Document Released: 07/21/2000 Document Revised: 12/30/2015 Document Reviewed: 10/19/2014 Elsevier Interactive Patient Education  2018 [employer]  

## 2017-11-13 ENCOUNTER — Ambulatory Visit: Payer: Medicare Other

## 2017-11-13 DIAGNOSIS — L918 Other hypertrophic disorders of the skin: Secondary | ICD-10-CM | POA: Diagnosis not present

## 2017-11-13 DIAGNOSIS — K432 Incisional hernia without obstruction or gangrene: Secondary | ICD-10-CM | POA: Diagnosis not present

## 2017-11-20 ENCOUNTER — Ambulatory Visit (INDEPENDENT_AMBULATORY_CARE_PROVIDER_SITE_OTHER): Payer: Medicare Other | Admitting: General Practice

## 2017-11-20 DIAGNOSIS — I743 Embolism and thrombosis of arteries of the lower extremities: Secondary | ICD-10-CM

## 2017-11-20 DIAGNOSIS — Z7901 Long term (current) use of anticoagulants: Secondary | ICD-10-CM | POA: Diagnosis not present

## 2017-11-20 LAB — POCT INR: INR: 1.7

## 2017-11-20 NOTE — Progress Notes (Signed)
I have reviewed and agree.

## 2017-11-20 NOTE — Patient Instructions (Addendum)
Pre visit review using our clinic review tool, if applicable. No additional management support is needed unless otherwise documented below in the visit note.  Take 1 1/2 take tablets today (4/16) and then continue to take 1 tablet all days.  Re-check in 4 weeks.

## 2017-12-18 ENCOUNTER — Ambulatory Visit (INDEPENDENT_AMBULATORY_CARE_PROVIDER_SITE_OTHER): Payer: Medicare Other | Admitting: General Practice

## 2017-12-18 DIAGNOSIS — Z7901 Long term (current) use of anticoagulants: Secondary | ICD-10-CM

## 2017-12-18 DIAGNOSIS — I743 Embolism and thrombosis of arteries of the lower extremities: Secondary | ICD-10-CM | POA: Diagnosis not present

## 2017-12-18 LAB — POCT INR: INR: 2.5

## 2017-12-18 NOTE — Patient Instructions (Addendum)
Pre visit review using our clinic review tool, if applicable. No additional management support is needed unless otherwise documented below in the visit note.  Continue to take 1 tablet all days.  Re-check in 4 weeks. (See patient instructions)  5/30 - Take last dose of coumadin until after procedure.  6/5 and 6/6 take 1 1/2 tablets of coumadin and then resume 1 tablet daily.  Re-check on 6/14.

## 2018-01-08 ENCOUNTER — Other Ambulatory Visit: Payer: Self-pay | Admitting: Surgery

## 2018-01-08 DIAGNOSIS — L821 Other seborrheic keratosis: Secondary | ICD-10-CM | POA: Diagnosis not present

## 2018-01-08 DIAGNOSIS — L820 Inflamed seborrheic keratosis: Secondary | ICD-10-CM | POA: Diagnosis not present

## 2018-01-08 DIAGNOSIS — M795 Residual foreign body in soft tissue: Secondary | ICD-10-CM | POA: Diagnosis not present

## 2018-01-14 ENCOUNTER — Other Ambulatory Visit: Payer: Self-pay | Admitting: Internal Medicine

## 2018-01-14 DIAGNOSIS — E781 Pure hyperglyceridemia: Secondary | ICD-10-CM

## 2018-01-18 ENCOUNTER — Ambulatory Visit (INDEPENDENT_AMBULATORY_CARE_PROVIDER_SITE_OTHER): Payer: Medicare Other | Admitting: General Practice

## 2018-01-18 DIAGNOSIS — I743 Embolism and thrombosis of arteries of the lower extremities: Secondary | ICD-10-CM

## 2018-01-18 DIAGNOSIS — Z7901 Long term (current) use of anticoagulants: Secondary | ICD-10-CM

## 2018-01-18 LAB — POCT INR: INR: 1.7 — AB (ref 2.0–3.0)

## 2018-01-18 NOTE — Patient Instructions (Addendum)
Pre visit review using our clinic review tool, if applicable. No additional management support is needed unless otherwise documented below in the visit note.  Take 1 1/2 and then continue to take 1 tablet all days.  Re-check in 4 weeks.

## 2018-01-26 ENCOUNTER — Other Ambulatory Visit: Payer: Self-pay | Admitting: Internal Medicine

## 2018-02-15 ENCOUNTER — Ambulatory Visit (INDEPENDENT_AMBULATORY_CARE_PROVIDER_SITE_OTHER): Payer: Medicare Other | Admitting: General Practice

## 2018-02-15 DIAGNOSIS — Z7901 Long term (current) use of anticoagulants: Secondary | ICD-10-CM

## 2018-02-15 DIAGNOSIS — I743 Embolism and thrombosis of arteries of the lower extremities: Secondary | ICD-10-CM

## 2018-02-15 LAB — POCT INR: INR: 2.6 (ref 2.0–3.0)

## 2018-02-15 NOTE — Patient Instructions (Addendum)
Pre visit review using our clinic review tool, if applicable. No additional management support is needed unless otherwise documented below in the visit note.  Continue to take 1 tablet all days.  Re-check in 4 weeks.  

## 2018-03-15 ENCOUNTER — Ambulatory Visit (INDEPENDENT_AMBULATORY_CARE_PROVIDER_SITE_OTHER): Payer: Medicare Other | Admitting: General Practice

## 2018-03-15 DIAGNOSIS — Z7901 Long term (current) use of anticoagulants: Secondary | ICD-10-CM | POA: Diagnosis not present

## 2018-03-15 DIAGNOSIS — I743 Embolism and thrombosis of arteries of the lower extremities: Secondary | ICD-10-CM

## 2018-03-15 LAB — POCT INR: INR: 2.1 (ref 2.0–3.0)

## 2018-03-15 NOTE — Patient Instructions (Signed)
Pre visit review using our clinic review tool, if applicable. No additional management support is needed unless otherwise documented below in the visit note.  Continue to take 1 tablet all days.  Re-check in 4 weeks.  

## 2018-03-30 ENCOUNTER — Other Ambulatory Visit: Payer: Self-pay | Admitting: Internal Medicine

## 2018-03-30 DIAGNOSIS — E785 Hyperlipidemia, unspecified: Secondary | ICD-10-CM

## 2018-03-30 DIAGNOSIS — I743 Embolism and thrombosis of arteries of the lower extremities: Secondary | ICD-10-CM

## 2018-04-09 DIAGNOSIS — D6859 Other primary thrombophilia: Secondary | ICD-10-CM | POA: Diagnosis not present

## 2018-04-09 DIAGNOSIS — Z9049 Acquired absence of other specified parts of digestive tract: Secondary | ICD-10-CM | POA: Diagnosis not present

## 2018-04-09 DIAGNOSIS — Z8719 Personal history of other diseases of the digestive system: Secondary | ICD-10-CM | POA: Diagnosis not present

## 2018-04-12 ENCOUNTER — Ambulatory Visit (INDEPENDENT_AMBULATORY_CARE_PROVIDER_SITE_OTHER): Payer: Medicare Other | Admitting: General Practice

## 2018-04-12 DIAGNOSIS — Z7901 Long term (current) use of anticoagulants: Secondary | ICD-10-CM

## 2018-04-12 DIAGNOSIS — I743 Embolism and thrombosis of arteries of the lower extremities: Secondary | ICD-10-CM

## 2018-04-12 LAB — POCT INR: INR: 2.3 (ref 2.0–3.0)

## 2018-04-12 NOTE — Patient Instructions (Signed)
Pre visit review using our clinic review tool, if applicable. No additional management support is needed unless otherwise documented below in the visit note.  Continue to take 1 tablet all days.  Re-check in 4 weeks.  Please follow patient instructions.  10-2 - Last dose of coumadin until after procedure  10-7 - Procedure  10-8,10-9 and 10-10 - Take 1 1/2 tablets of coumadin  10-11 - Resume current dosage and re-check INR

## 2018-04-13 ENCOUNTER — Other Ambulatory Visit: Payer: Self-pay | Admitting: Internal Medicine

## 2018-04-15 ENCOUNTER — Telehealth: Payer: Self-pay | Admitting: General Practice

## 2018-04-15 NOTE — Telephone Encounter (Signed)
-----   Message from Janith Lima, MD sent at 04/13/2018  9:26 AM EDT ----- Regarding: FW: Lovenox bridge Yes, he should do a bridge  TRonnald Ramp   ----- Message ----- From: Warden Fillers, RN Sent: 04/12/2018   9:24 AM EDT To: Warden Fillers, RN, Janith Lima, MD Subject: Lovenox bridge                                 Patient is having a procedure on 10/7 and will need to stop coumadin for 5 days.  Do you feel pt needs a Lovenox bridge?  Please advise.  Thanks, [employer]

## 2018-04-18 ENCOUNTER — Other Ambulatory Visit: Payer: Self-pay | Admitting: Internal Medicine

## 2018-04-18 DIAGNOSIS — E781 Pure hyperglyceridemia: Secondary | ICD-10-CM

## 2018-04-19 ENCOUNTER — Other Ambulatory Visit: Payer: Self-pay | Admitting: General Practice

## 2018-04-19 ENCOUNTER — Telehealth: Payer: Self-pay | Admitting: General Practice

## 2018-04-19 MED ORDER — ENOXAPARIN SODIUM 150 MG/ML ~~LOC~~ SOLN: 150.0000 mg | SUBCUTANEOUS | 0 refills | Status: DC

## 2018-04-19 NOTE — Telephone Encounter (Signed)
Instructions for Lovenox bridge for procedure on 10/7.  10/2 - Last dose of coumadin until after procedure 10/3 - Nothing (No coumadin and No Lovenox) 10/4 - Lovenox in the AM 10/5 - Lovenox in the AM 10/6 - Lovenox in the AM (Take by 7 am) 10/7 - Procedure (No Lovenox today) 10/8 - Lovenox in the AM AND 1 1/2 tablets of coumadin 10/9 - Lovenox in the AM and 1 1/2 tablets of coumadin 10/10 - Lovenox in the AM and 1 1/2 tablets of coumadin 11/11 - Check INR

## 2018-04-19 NOTE — Telephone Encounter (Signed)
Correction to below:  10/11 - Lovenox and 1 tablet of coumadin  10/12 - Resume current dosage of coumadin and stop Lovenox  10/15 - Check INR at 9:30

## 2018-04-22 ENCOUNTER — Encounter: Payer: Self-pay | Admitting: Family

## 2018-04-22 ENCOUNTER — Ambulatory Visit (INDEPENDENT_AMBULATORY_CARE_PROVIDER_SITE_OTHER): Payer: Medicare Other | Admitting: Family

## 2018-04-22 VITALS — BP 108/70 | HR 73 | Temp 98.6°F | Ht 69.0 in | Wt 214.0 lb

## 2018-04-22 DIAGNOSIS — H6123 Impacted cerumen, bilateral: Secondary | ICD-10-CM

## 2018-04-22 DIAGNOSIS — Z23 Encounter for immunization: Secondary | ICD-10-CM | POA: Diagnosis not present

## 2018-04-22 MED ORDER — NEOMYCIN-POLYMYXIN-HC 3.5-10000-1 OT SOLN: 3.0000 [drp] | Freq: Three times a day (TID) | OTIC | 0 refills | Status: DC

## 2018-04-22 NOTE — Patient Instructions (Signed)
You can use OTC Debrox once a month to help prevent recurrent problems with ear wax.

## 2018-04-22 NOTE — Addendum Note (Signed)
Addended by: Marcina Millard on: 04/22/2018 11:41 AM   Modules accepted: Orders

## 2018-04-22 NOTE — Progress Notes (Signed)
Rick Edwards is a 62 y.o. male with the following history as recorded in EpicCare:  Patient Active Problem List   Diagnosis Date Noted  . Verrucous skin lesion 10/29/2017  . Atherosclerosis of aorta ([place]) 09/26/2017  . Acute midline low back pain 09/25/2017  . Benign prostatic hyperplasia without lower urinary tract symptoms 09/25/2017  . Thrombocytosis ([place]) 07/19/2016  . Drug-induced erectile dysfunction 07/19/2016  . Pure hyperglyceridemia 04/05/2016  . Hyperglycemia 01/18/2016  . Hyperlipidemia with target LDL less than 70 01/18/2016  . Proteinuria 03/13/2014  . Right knee DJD 11/25/2013  . PVD (peripheral vascular disease) ([place]) 09/24/2013  . Encounter for therapeutic drug monitoring 09/16/2013  . Kidney disease, chronic, stage III (GFR 30-59 ml/min) (HCC) 05/26/2013  . Antithrombin III deficiency ([place]) 05/16/2013  . Long term (current) use of anticoagulants 11/27/2012  . 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 ([place]) 04/10/2012  . Splenic infarction 03/20/2012  . Routine general medical examination at a health care facility 08/29/2011  . Benign neoplasm of colon 05/06/2009  . CROHN'S DISEASE-LARGE INTESTINE 03/19/2009  . Essential hypertension, malignant 03/02/2009  . Left lower quadrant abdominal tenderness without rebound tenderness 03/02/2009    Current Outpatient Medications  Medication Sig Dispense Refill  . amLODipine (NORVASC) 10 MG tablet Take 10 mg by mouth at bedtime.     Marland Kitchen atorvastatin (LIPITOR) 20 MG tablet TAKE 1 TABLET BY MOUTH ONCE DAILY 90 tablet 1  . carvedilol (COREG) 25 MG tablet Take 1 tablet (25 mg total) by mouth 2 (two) times daily. 180 tablet 3  . chlorthalidone (HYGROTON) 25 MG tablet Take 25 mg by mouth every morning.    . enoxaparin (LOVENOX) 150 MG/ML injection Inject 1 mL (150 mg total) into the skin daily. 7 Syringe 0  . fenofibrate (TRICOR) 145 MG tablet TAKE 1 TABLET BY MOUTH ONCE DAILY 90 tablet 0  . fenofibrate (TRICOR)  145 MG tablet TAKE 1 TABLET BY MOUTH ONCE DAILY 90 tablet 0  . potassium chloride SA (K-DUR,KLOR-CON) 20 MEQ tablet Take 1 tablet (20 mEq total) by mouth daily. 90 tablet 1  . spironolactone (ALDACTONE) 25 MG tablet Take 25 mg by mouth every morning.     . warfarin (COUMADIN) 5 MG tablet TAKE 1 TABLET (5 MG TOTAL) BY MOUTH SEE ADMIN INSTRUCTIONS. 45 tablet 3  . warfarin (COUMADIN) 5 MG tablet TAKE AS DIRECTED BY  ANTICOAGULATION  CLINIC 40 tablet 3  . neomycin-polymyxin-hydrocortisone (CORTISPORIN) OTIC solution Place 3 drops into both ears 3 (three) times daily. 10 mL 0   No current facility-administered medications for this visit.     Allergies: Mesalamine  Past Medical History:  Diagnosis Date  . Anemia    anemia thrombocytopenia, saw Hematology 2011, can not r/o myeloproliferative d/c  . Antithrombin III deficiency ([place]) 05/16/2013   On coumadin for this  . Blood clot in vein    in right leg  . Chronic kidney disease 05/27/2013   ACUTE RENAL FAILURE   . Crohn's disease ([place])    tx. Imuran  . GI bleed 6/11   w/ normal EGD 6/11, 3 PRBCs   . Hearing loss    wears hearing aid left ear; birthed with hearing loss  . HOH (hard of hearing) 05/16/2013  . Hypercalcemia   . Hyperkalemia 05/27/2013  . Hypertension   . Ileostomy in place [facility]) 04-11-13   04-18-13 ileostomy to be taken down.  . Perianal abscess 2001   s/p right hemicolectomy, and drainage of retroperitoneal abcess 2003  .  Peripheral vascular disease ([place])   . Transfusion history    last 8'13    Past Surgical History:  Procedure Laterality Date  . Anal fistulotomy  2002  . EMBOLECTOMY  03/12/2012   Procedure: EMBOLECTOMY;  Surgeon: Angelia Mould, MD;  Location: [facility] OR;  Service: Vascular;  Laterality: Right;  Right popliteal embolectomy with vein patch angioplasty, right posterior tibial embolectomy with vein patch angioplasty   . HEMICOLECTOMY  08/29/2001   perforated abscess  . ILEOSTOMY CLOSURE N/A 04/18/2013    Procedure: ILEOSTOMY TAKEDOWN;  Surgeon: Edward Jolly, MD;  Location: [facility] ORS;  Service: General;  Laterality: N/A;  . INTRAOPERATIVE ARTERIOGRAM  03/12/2012   Procedure: INTRA OPERATIVE ARTERIOGRAM;  Surgeon: Angelia Mould, MD;  Location: [facility];  Service: Vascular;  Laterality: Right;  . PARTIAL COLECTOMY  03/11/2012   Procedure: PARTIAL COLECTOMY;  Surgeon: Edward Jolly, MD;  Location: [facility] ORS;  Service: General;  Laterality: N/A;  subtotal colectomy transverse and left colon   . TRANSMETATARSAL AMPUTATION  05/10/2012   right    Family History  Problem Relation Age of Onset  . Hypertension Mother   . Hyperlipidemia Mother   . Hypertension Father   . Hyperlipidemia Father   . Heart disease Father   . Stroke Unknown        GF, aunts   . Hypertension Unknown        "the whole family"  . Coronary artery disease Neg Hx   . Diabetes Neg Hx   . Colon cancer Neg Hx   . Prostate cancer Neg Hx   . Kidney failure Neg Hx     Social History   Tobacco Use  . Smoking status: Former Smoker    Packs/day: 0.25    Years: 9.00    Pack years: 2.25    Types: Cigarettes    Last attempt to quit: 05/09/2011    Years since quitting: 6.9  . Smokeless tobacco: Never Used  Substance Use Topics  . Alcohol use: No    Alcohol/week: 0.0 standard drinks    Subjective:  Patient presents with concerns for decreased hearing in both ears; feels like his ears are "full of wax." Cannot hear even with his hearing aids; has had some drainage recently;   Would like to get his flu shot done today;   Objective:  Vitals:   04/22/18 1023  BP: 108/70  Pulse: 73  Temp: 98.6 F (37 C)  TempSrc: Oral  SpO2: 96%  Weight: 214 lb 0.6 oz (97.1 kg)  Height: 5\' 9"  (1.753 m)    General: Well developed, well nourished, in no acute distress  Skin : Warm and dry.  Head: Normocephalic and atraumatic  Eyes: Sclera and conjunctiva clear; pupils round and reactive to light; extraocular movements intact   Ears: External normal; after bilateral lavage, canals clear; tympanic membranes normal  Oropharynx: Pink, supple. No suspicious lesions  Neck: Supple without thyromegaly, adenopathy  Lungs: Respirations unlabored;  Neurologic: Alert and oriented; speech intact; face symmetrical; moves all extremities well; CNII-XII intact without focal deficit   Assessment:  1. Bilateral impacted cerumen     Plan:  Ear lavage completed with no difficulty; Rx for Cortisporin Otic drops to use in both ears for the next 5 days; recommend to use Debrox regularly to prevent recurrent problems with ear wax;  Flu shot given today;   No follow-ups on file.  No orders of the defined types were placed in this encounter.   Requested  Prescriptions   Signed Prescriptions Disp Refills  . neomycin-polymyxin-hydrocortisone (CORTISPORIN) OTIC solution 10 mL 0    Sig: Place 3 drops into both ears 3 (three) times daily.

## 2018-04-26 NOTE — Telephone Encounter (Signed)
Noted.  I will clarify instructions with patient.

## 2018-04-26 NOTE — Telephone Encounter (Signed)
Correction to note below about Lovenox bridge.  10-3 - Last dose of coumadin until after procedure. 10-4 - Nothing (No coumadin and No Lovenox) 10-5 - Lovenox in the AM 10-6 - Lovenox in the AM 10-7 - Lovenox in the AM (Take by 7 am) 10-8 - Procedure - No Lovenox today 10-9 - Lovenox in the AM and 1 1/2 tablets of coumadin 10-10- Lovenox in the AM and 1 1/2 tablets of coumadin 10-11 - Lovenox in the AM and 1 1/2 tablets of coumadin 10-12 - Lovenox in the AM and 1 tablet of coumadin 10-13 - Stop lovenox and resume current dosage of coumadin.  10-15 Re-check INR  New instructions have been reviewed with patient.

## 2018-04-26 NOTE — Telephone Encounter (Signed)
[place] with [facility] called stating pts procedure is 05/14/18 and the Lovenox bridge needs to be adjusted. Please call back after 12:45 today [phone].

## 2018-05-03 ENCOUNTER — Telehealth: Payer: Self-pay | Admitting: General Practice

## 2018-05-03 NOTE — Telephone Encounter (Signed)
I spoke with patient on 9/26. He did receive the instructions for Coumadin and Lovenox bridge for upcoming procedure on 10/8.  I reviewed instructions with patient and he did verbalize understanding.

## 2018-05-14 ENCOUNTER — Telehealth: Payer: Self-pay | Admitting: Internal Medicine

## 2018-05-14 DIAGNOSIS — K648 Other hemorrhoids: Secondary | ICD-10-CM | POA: Diagnosis not present

## 2018-05-14 DIAGNOSIS — Z8719 Personal history of other diseases of the digestive system: Secondary | ICD-10-CM | POA: Diagnosis not present

## 2018-05-14 DIAGNOSIS — Z980 Intestinal bypass and anastomosis status: Secondary | ICD-10-CM | POA: Diagnosis not present

## 2018-05-14 NOTE — Telephone Encounter (Signed)
Copied from [place] [id number]. Topic: Quick Communication - See Telephone Encounter >> May 14, 2018  1:19 PM Bea Graff, NT wrote: CRM for notification. See Telephone encounter for: 05/14/18. Pt had a colonoscopy and patients mother, Oather Muilenburg is calling to see if he should restart his enoxaparin (LOVENOX) 150 MG/ML injection today. CB#: [phone].

## 2018-05-14 NOTE — Telephone Encounter (Signed)
Contacted pt mother and gave her the remaining directions for Lovenox.

## 2018-05-14 NOTE — Telephone Encounter (Signed)
10-8 - Procedure - No Lovenox today 10-9 - Lovenox in the AM and 1 1/2 tablets of coumadin 10-10- Lovenox in the AM and 1 1/2 tablets of coumadin 10-11 - Lovenox in the AM and 1 1/2 tablets of coumadin 10-12 - Lovenox in the AM and 1 tablet of coumadin 10-13 - Stop lovenox and resume current dosage of coumadin.  10-15 Re-check INR  New instructions have been reviewed with patient.

## 2018-05-17 DIAGNOSIS — Z8719 Personal history of other diseases of the digestive system: Secondary | ICD-10-CM | POA: Diagnosis not present

## 2018-05-17 DIAGNOSIS — Z980 Intestinal bypass and anastomosis status: Secondary | ICD-10-CM | POA: Diagnosis not present

## 2018-05-21 ENCOUNTER — Ambulatory Visit (INDEPENDENT_AMBULATORY_CARE_PROVIDER_SITE_OTHER): Payer: Medicare Other | Admitting: General Practice

## 2018-05-21 DIAGNOSIS — Z7901 Long term (current) use of anticoagulants: Secondary | ICD-10-CM

## 2018-05-21 DIAGNOSIS — I743 Embolism and thrombosis of arteries of the lower extremities: Secondary | ICD-10-CM

## 2018-05-21 LAB — POCT INR: INR: 1.8 — AB (ref 2.0–3.0)

## 2018-05-21 NOTE — Patient Instructions (Addendum)
Pre visit review using our clinic review tool, if applicable. No additional management support is needed unless otherwise documented below in the visit note.  Take 1 1/2 tablets today and then continue to take 1 tablet all days.  Re-check in 4 weeks.

## 2018-05-22 DIAGNOSIS — H3589 Other specified retinal disorders: Secondary | ICD-10-CM | POA: Diagnosis not present

## 2018-05-22 DIAGNOSIS — H2513 Age-related nuclear cataract, bilateral: Secondary | ICD-10-CM | POA: Diagnosis not present

## 2018-06-18 ENCOUNTER — Ambulatory Visit (INDEPENDENT_AMBULATORY_CARE_PROVIDER_SITE_OTHER): Payer: Medicare Other | Admitting: General Practice

## 2018-06-18 DIAGNOSIS — Z7901 Long term (current) use of anticoagulants: Secondary | ICD-10-CM | POA: Diagnosis not present

## 2018-06-18 DIAGNOSIS — I743 Embolism and thrombosis of arteries of the lower extremities: Secondary | ICD-10-CM

## 2018-06-18 LAB — POCT INR: INR: 2.3 (ref 2.0–3.0)

## 2018-06-18 NOTE — Patient Instructions (Signed)
Pre visit review using our clinic review tool, if applicable. No additional management support is needed unless otherwise documented below in the visit note.  Continue to take 1 tablet all days.  Re-check in 4 weeks.

## 2018-07-12 ENCOUNTER — Other Ambulatory Visit: Payer: Self-pay | Admitting: Internal Medicine

## 2018-07-16 ENCOUNTER — Ambulatory Visit (INDEPENDENT_AMBULATORY_CARE_PROVIDER_SITE_OTHER): Payer: Medicare Other | Admitting: General Practice

## 2018-07-16 ENCOUNTER — Telehealth: Payer: Self-pay | Admitting: Internal Medicine

## 2018-07-16 ENCOUNTER — Encounter: Payer: Self-pay | Admitting: Internal Medicine

## 2018-07-16 DIAGNOSIS — I743 Embolism and thrombosis of arteries of the lower extremities: Secondary | ICD-10-CM | POA: Diagnosis not present

## 2018-07-16 DIAGNOSIS — Z7901 Long term (current) use of anticoagulants: Secondary | ICD-10-CM | POA: Diagnosis not present

## 2018-07-16 LAB — POCT INR: INR: 2.4 (ref 2.0–3.0)

## 2018-07-16 NOTE — Telephone Encounter (Signed)
yes

## 2018-07-16 NOTE — Telephone Encounter (Signed)
Signed.  LVM to inform patient that the completed letter is ready to be picked up.

## 2018-07-16 NOTE — Telephone Encounter (Signed)
Pt dropped of jury duty summons & is requesting an excusal letter.  Please call pt once letter is ready for pick up.  Summons placed in Brittany's box.

## 2018-07-16 NOTE — Patient Instructions (Signed)
Pre visit review using our clinic review tool, if applicable. No additional management support is needed unless otherwise documented below in the visit note.  Continue to take 1 tablet all days.  Re-check in 4 weeks.

## 2018-07-16 NOTE — Telephone Encounter (Signed)
Letter has been completed & Placed in providers box to sign.

## 2018-07-16 NOTE — Telephone Encounter (Signed)
Patient states he has hearing problems. I do not see this on his problem list. But he did have trouble hearing when dropping this off. Please advise if okay to complete letter.

## 2018-07-21 ENCOUNTER — Other Ambulatory Visit: Payer: Self-pay | Admitting: Internal Medicine

## 2018-07-21 DIAGNOSIS — E781 Pure hyperglyceridemia: Secondary | ICD-10-CM

## 2018-07-23 ENCOUNTER — Other Ambulatory Visit: Payer: Self-pay | Admitting: Emergency Medicine

## 2018-07-23 DIAGNOSIS — E781 Pure hyperglyceridemia: Secondary | ICD-10-CM

## 2018-07-23 MED ORDER — FENOFIBRATE 145 MG PO TABS: 145.0000 mg | ORAL_TABLET | Freq: Every day | ORAL | 0 refills | Status: DC

## 2018-07-26 NOTE — Telephone Encounter (Signed)
Error

## 2018-08-13 ENCOUNTER — Ambulatory Visit (INDEPENDENT_AMBULATORY_CARE_PROVIDER_SITE_OTHER): Payer: Medicare Other | Admitting: General Practice

## 2018-08-13 DIAGNOSIS — I743 Embolism and thrombosis of arteries of the lower extremities: Secondary | ICD-10-CM

## 2018-08-13 DIAGNOSIS — Z7901 Long term (current) use of anticoagulants: Secondary | ICD-10-CM

## 2018-08-13 LAB — POCT INR: INR: 2.5 (ref 2.0–3.0)

## 2018-08-13 NOTE — Patient Instructions (Addendum)
Pre visit review using our clinic review tool, if applicable. No additional management support is needed unless otherwise documented below in the visit note.  Continue to take 1 tablet all days.  Re-check in 6 weeks.   

## 2018-08-15 ENCOUNTER — Ambulatory Visit (INDEPENDENT_AMBULATORY_CARE_PROVIDER_SITE_OTHER): Payer: Medicare Other | Admitting: Internal Medicine

## 2018-08-15 ENCOUNTER — Encounter: Payer: Self-pay | Admitting: Internal Medicine

## 2018-08-15 ENCOUNTER — Other Ambulatory Visit (INDEPENDENT_AMBULATORY_CARE_PROVIDER_SITE_OTHER): Payer: Medicare Other

## 2018-08-15 VITALS — BP 122/78 | HR 62 | Temp 98.1°F | Resp 16 | Ht 69.0 in | Wt 213.2 lb

## 2018-08-15 DIAGNOSIS — D75839 Thrombocytosis, unspecified: Secondary | ICD-10-CM

## 2018-08-15 DIAGNOSIS — G8929 Other chronic pain: Secondary | ICD-10-CM

## 2018-08-15 DIAGNOSIS — R739 Hyperglycemia, unspecified: Secondary | ICD-10-CM | POA: Diagnosis not present

## 2018-08-15 DIAGNOSIS — I10 Essential (primary) hypertension: Secondary | ICD-10-CM

## 2018-08-15 DIAGNOSIS — I743 Embolism and thrombosis of arteries of the lower extremities: Secondary | ICD-10-CM | POA: Diagnosis not present

## 2018-08-15 DIAGNOSIS — M79642 Pain in left hand: Secondary | ICD-10-CM | POA: Diagnosis not present

## 2018-08-15 DIAGNOSIS — Z23 Encounter for immunization: Secondary | ICD-10-CM

## 2018-08-15 DIAGNOSIS — R808 Other proteinuria: Secondary | ICD-10-CM

## 2018-08-15 DIAGNOSIS — N183 Chronic kidney disease, stage 3 unspecified: Secondary | ICD-10-CM

## 2018-08-15 DIAGNOSIS — Z114 Encounter for screening for human immunodeficiency virus [HIV]: Secondary | ICD-10-CM

## 2018-08-15 DIAGNOSIS — I700 Atherosclerosis of aorta: Secondary | ICD-10-CM

## 2018-08-15 DIAGNOSIS — E118 Type 2 diabetes mellitus with unspecified complications: Secondary | ICD-10-CM

## 2018-08-15 DIAGNOSIS — N522 Drug-induced erectile dysfunction: Secondary | ICD-10-CM

## 2018-08-15 DIAGNOSIS — E785 Hyperlipidemia, unspecified: Secondary | ICD-10-CM

## 2018-08-15 DIAGNOSIS — D473 Essential (hemorrhagic) thrombocythemia: Secondary | ICD-10-CM

## 2018-08-15 DIAGNOSIS — I739 Peripheral vascular disease, unspecified: Secondary | ICD-10-CM

## 2018-08-15 DIAGNOSIS — E781 Pure hyperglyceridemia: Secondary | ICD-10-CM

## 2018-08-15 DIAGNOSIS — M65342 Trigger finger, left ring finger: Secondary | ICD-10-CM | POA: Diagnosis not present

## 2018-08-15 DIAGNOSIS — L858 Other specified epidermal thickening: Secondary | ICD-10-CM | POA: Diagnosis not present

## 2018-08-15 LAB — COMPREHENSIVE METABOLIC PANEL
ALT: 17 U/L (ref 0–53)
AST: 14 U/L (ref 0–37)
Albumin: 4.4 g/dL (ref 3.5–5.2)
Alkaline Phosphatase: 48 U/L (ref 39–117)
BUN: 17 mg/dL (ref 6–23)
CHLORIDE: 99 meq/L (ref 96–112)
CO2: 28 meq/L (ref 19–32)
CREATININE: 1.19 mg/dL (ref 0.40–1.50)
Calcium: 9.9 mg/dL (ref 8.4–10.5)
GFR: 80.15 mL/min (ref >60.00)
Glucose, Bld: 108 mg/dL — ABNORMAL HIGH (ref 70–99)
POTASSIUM: 4 meq/L (ref 3.5–5.1)
SODIUM: 138 meq/L (ref 135–145)
Total Bilirubin: 0.6 mg/dL (ref 0.2–1.2)
Total Protein: 7.4 g/dL (ref 6.0–8.3)

## 2018-08-15 LAB — CBC WITH DIFFERENTIAL/PLATELET
BASOS ABS: 0.1 10*3/uL (ref 0.0–0.1)
BASOS PCT: 1.2 % (ref 0.0–3.0)
EOS ABS: 0.2 10*3/uL (ref 0.0–0.7)
Eosinophils Relative: 2.6 % (ref 0.0–5.0)
HEMATOCRIT: 44.6 % (ref 39.0–52.0)
Hemoglobin: 15.6 g/dL (ref 13.0–17.0)
LYMPHS PCT: 29.8 % (ref 12.0–46.0)
Lymphs Abs: 1.8 10*3/uL (ref 0.7–4.0)
MCHC: 35 g/dL (ref 30.0–36.0)
MCV: 89.9 fl (ref 78.0–100.0)
MONO ABS: 0.6 10*3/uL (ref 0.1–1.0)
Monocytes Relative: 9.6 % (ref 3.0–12.0)
Neutro Abs: 3.5 10*3/uL (ref 1.4–7.7)
Neutrophils Relative %: 56.8 % (ref 43.0–77.0)
Platelets: 433 10*3/uL — ABNORMAL HIGH (ref 150.0–400.0)
RBC: 4.96 Mil/uL (ref 4.22–5.81)
RDW: 13.9 % (ref 11.5–15.5)
WBC: 6.1 10*3/uL (ref 4.0–10.5)

## 2018-08-15 LAB — URINALYSIS, ROUTINE W REFLEX MICROSCOPIC
BILIRUBIN URINE: NEGATIVE
Hgb urine dipstick: NEGATIVE
KETONES UR: NEGATIVE
Leukocytes, UA: NEGATIVE
NITRITE: NEGATIVE
RBC / HPF: NONE SEEN (ref 0–?)
SPECIFIC GRAVITY, URINE: 1.02 (ref 1.000–1.030)
Total Protein, Urine: NEGATIVE
UROBILINOGEN UA: 0.2 (ref 0.0–1.0)
Urine Glucose: NEGATIVE
WBC UA: NONE SEEN (ref 0–?)
pH: 6.5 (ref 5.0–8.0)

## 2018-08-15 LAB — IBC PANEL
IRON: 68 ug/dL (ref 42–165)
SATURATION RATIOS: 15.9 % — AB (ref 20.0–50.0)
Transferrin: 305 mg/dL (ref 212.0–360.0)

## 2018-08-15 LAB — LIPID PANEL
CHOLESTEROL: 174 mg/dL (ref 0–200)
HDL: 35.8 mg/dL — ABNORMAL LOW (ref >39.00)
LDL Cholesterol: 115 mg/dL — ABNORMAL HIGH (ref 0–99)
NONHDL: 138.22
Total CHOL/HDL Ratio: 5
Triglycerides: 115 mg/dL (ref 0.0–149.0)
VLDL: 23 mg/dL (ref 0.0–40.0)

## 2018-08-15 LAB — HEMOGLOBIN A1C: HEMOGLOBIN A1C: 6.6 % — AB (ref 4.6–6.5)

## 2018-08-15 LAB — FERRITIN: FERRITIN: 312.8 ng/mL (ref 22.0–322.0)

## 2018-08-15 MED ORDER — AVANAFIL 200 MG PO TABS: 1.0000 | ORAL_TABLET | Freq: Every day | ORAL | 5 refills | Status: DC | PRN

## 2018-08-15 NOTE — Progress Notes (Signed)
Subjective:  Patient ID: Rick Edwards, male    DOB: 26-Nov-1955  Age: 63 y.o. MRN: [id number]  CC: Hyperlipidemia and Hypertension   HPI LANIER MILLON presents for f/up - Based on his prescription refills it looks like he is not currently taking any antihypertensives.  He cannot tell me whether or not he is taking any of the antihypertensives that are currently listed.  He does not monitor his blood pressure but denies any recent episodes of headache, blurred vision, CP, DOE, palpitations, edema, or fatigue.  He complains of a 3-day history of spontaneous left hand pain.  He localizes the discomfort to the dorsum of the left mid hand.  He denies trauma or injury.  He is not currently taking anything to control the pain. He also complains that he has developed a trigger mechanism in the left ring finger.  Additionally, he complains that over the last few months he has developed a lesion on his right chin.  Outpatient Medications Prior to Visit  Medication Sig Dispense Refill  . atorvastatin (LIPITOR) 20 MG tablet TAKE 1 TABLET BY MOUTH ONCE DAILY 90 tablet 1  . fenofibrate (TRICOR) 145 MG tablet Take 1 tablet (145 mg total) by mouth daily. 90 tablet 0  . warfarin (COUMADIN) 5 MG tablet TAKE AS DIRECTED BY ANTICOAGULATION CLINIC 40 tablet 3  . amLODipine (NORVASC) 10 MG tablet Take 10 mg by mouth at bedtime.     . carvedilol (COREG) 25 MG tablet Take 1 tablet (25 mg total) by mouth 2 (two) times daily. 180 tablet 3  . chlorthalidone (HYGROTON) 25 MG tablet Take 25 mg by mouth every morning.    . potassium chloride SA (K-DUR,KLOR-CON) 20 MEQ tablet Take 1 tablet (20 mEq total) by mouth daily. 90 tablet 1  . spironolactone (ALDACTONE) 25 MG tablet Take 25 mg by mouth every morning.     . enoxaparin (LOVENOX) 150 MG/ML injection Inject 1 mL (150 mg total) into the skin daily. 7 Syringe 0  . neomycin-polymyxin-hydrocortisone (CORTISPORIN) OTIC solution Place 3 drops into both ears 3  (three) times daily. 10 mL 0  . warfarin (COUMADIN) 5 MG tablet TAKE 1 TABLET (5 MG TOTAL) BY MOUTH SEE ADMIN INSTRUCTIONS. 45 tablet 3   No facility-administered medications prior to visit.     ROS Review of Systems  Constitutional: Negative for appetite change, diaphoresis, fatigue and fever.  HENT: Negative.   Eyes: Negative.   Respiratory: Negative.  Negative for cough, chest tightness, shortness of breath and wheezing.   Cardiovascular: Negative for chest pain, palpitations and leg swelling.  Gastrointestinal: Negative for abdominal pain, constipation, diarrhea, nausea and vomiting.  Genitourinary: Negative.  Negative for difficulty urinating and dysuria.       +ED  Musculoskeletal: Positive for arthralgias. Negative for back pain, myalgias and neck pain.  Skin: Negative.  Negative for color change and rash.  Neurological: Negative.  Negative for dizziness, weakness and light-headedness.  Hematological: Negative for adenopathy. Does not bruise/bleed easily.  Psychiatric/Behavioral: Negative.     Objective:  BP 122/78 (BP Location: Left Arm, Patient Position: Sitting, Cuff Size: Normal)   Pulse 62   Temp 98.1 F (36.7 C) (Oral)   Resp 16   Ht 5\' 9"  (1.753 m)   Wt 213 lb 4 oz (96.7 kg)   SpO2 98%   BMI 31.49 kg/m   BP Readings from Last 3 Encounters:  08/15/18 122/78  04/22/18 108/70  10/29/17 110/70    Wt Readings from Last  3 Encounters:  08/15/18 213 lb 4 oz (96.7 kg)  04/22/18 214 lb 0.6 oz (97.1 kg)  10/29/17 218 lb (98.9 kg)    Physical Exam Vitals signs reviewed.  Constitutional:      Appearance: He is not ill-appearing or toxic-appearing.  HENT:     Head:      Nose: Nose normal. No congestion.     Mouth/Throat:     Mouth: Mucous membranes are moist.     Pharynx: No oropharyngeal exudate or posterior oropharyngeal erythema.  Eyes:     General: No scleral icterus.    Conjunctiva/sclera: Conjunctivae normal.  Neck:     Musculoskeletal: Normal range  of motion and neck supple. No muscular tenderness.  Cardiovascular:     Rate and Rhythm: Normal rate and regular rhythm.     Heart sounds: No murmur. No friction rub. No gallop.   Pulmonary:     Effort: Pulmonary effort is normal.     Breath sounds: Normal breath sounds. No stridor. No wheezing, rhonchi or rales.  Abdominal:     General: Abdomen is flat.     Palpations: There is no hepatomegaly, splenomegaly or mass.     Tenderness: There is no abdominal tenderness.     Hernia: No hernia is present.  Musculoskeletal: Normal range of motion.        General: No swelling.     Left hand: Normal. He exhibits normal range of motion, no tenderness, no bony tenderness, normal capillary refill, no deformity, no laceration and no swelling. Normal sensation noted. Normal strength noted.     Right lower leg: No edema.     Left lower leg: No edema.     Comments: The only abnormality I can see in his left hand is a trigger mechanism in the ring finger at the PIP joint  Skin:    General: Skin is warm and dry.     Findings: No rash.  Neurological:     General: No focal deficit present.     Mental Status: He is oriented to person, place, and time. Mental status is at baseline.     Lab Results  Component Value Date   WBC 6.1 08/15/2018   HGB 15.6 08/15/2018   HCT 44.6 08/15/2018   PLT 433.0 (H) 08/15/2018   GLUCOSE 108 (H) 08/15/2018   CHOL 174 08/15/2018   TRIG 115.0 08/15/2018   HDL 35.80 (L) 08/15/2018   LDLDIRECT 112.0 01/18/2016   LDLCALC 115 (H) 08/15/2018   ALT 17 08/15/2018   AST 14 08/15/2018   NA 138 08/15/2018   K 4.0 08/15/2018   CL 99 08/15/2018   CREATININE 1.19 08/15/2018   BUN 17 08/15/2018   CO2 28 08/15/2018   TSH 1.49 01/18/2016   PSA 1.49 09/25/2017   INR 2.5 08/13/2018   HGBA1C 6.6 (H) 08/15/2018    Dg Lumbar Spine Complete  Result Date: 09/25/2017 CLINICAL DATA:  63 year old male with lower back pain for 2 weeks. Lifted heavy objects. No known injury.  Initial encounter. EXAM: LUMBAR SPINE - COMPLETE 4+ VIEW COMPARISON:  05/08/2013 CT abdomen and pelvis. FINDINGS: Normal alignment. Mild to moderate L5-S1 disc space narrowing most notable posteriorly. Mild L5-S1 facet degenerative changes.  No pars defect noted. Vascular calcifications. IMPRESSION: Mild to moderate L5-S1 disc space narrowing (most notable posteriorly). Aortic Atherosclerosis (ICD10-I70.0). Electronically Signed   By: Genia Del M.D.   On: 09/25/2017 17:12    Assessment & Plan:   Xavier was seen today for hyperlipidemia  and hypertension.  Diagnoses and all orders for this visit:  Kidney disease, chronic, stage III (GFR 30-59 ml/min) ([place])- His creatinine clearance is 75.7 mLs/min.  His renal function is normal at this time. -     Urinalysis, Routine w reflex microscopic; Future -     Comprehensive metabolic panel; Future  Thrombocytosis ([place])- His platelet count remains elevated.  His insurance did not approve my request to screen him for B12 or folate deficiency.  His iron level remains normal.  There is no evidence of coagulopathy at this time.  I will continue to monitor this. -     CBC with Differential/Platelet; Future -     IBC panel; Future -     Ferritin; Future  Hyperglycemia- His A1c is at 6.6%. -     Hemoglobin A1c; Future -     Comprehensive metabolic panel; Future  Pure hyperglyceridemia- His trigs are normal now.  I have recommended that he stop taking fenofibrate -     Lipid panel; Future  Hyperlipidemia with target LDL less than 70- He has not achieved his LDL goal so I have asked him to increase his dose of atorvastatin. -     Lipid panel; Future  Other proteinuria  Essential hypertension, malignant- His blood pressure is adequately well controlled on what appears to be no antihypertensives. -     Comprehensive metabolic panel; Future  Cutaneous horn -     Ambulatory referral to Dermatology  Chronic hand pain, left- Exam of the area is  normal.  I have asked him to have a plain film done to see if there is a bony lesion in the area. -     DG Hand Complete Left; Future  Trigger finger, left ring finger -     Ambulatory referral to Orthopedic Surgery  Atherosclerosis of aorta (HCC)  Drug-induced erectile dysfunction -     Avanafil (STENDRA) 200 MG TABS; Take 1 tablet by mouth daily as needed.  Screening for HIV (human immunodeficiency virus) -     HIV Antibody (routine testing w rflx); Future  PVD (peripheral vascular disease) ([place])  Need for Tdap vaccination -     Tdap vaccine greater than or equal to 7yo IM  Need for pneumococcal vaccination -     Pneumococcal polysaccharide vaccine 23-valent greater than or equal to 2yo subcutaneous/IM   I have discontinued Delaney Meigs. Rushing's spironolactone, chlorthalidone, amLODipine, carvedilol, potassium chloride SA, enoxaparin, and neomycin-polymyxin-hydrocortisone. I am also having him start on Avanafil. Additionally, I am having him maintain his atorvastatin, warfarin, and fenofibrate.  Meds ordered this encounter  Medications  . Avanafil (STENDRA) 200 MG TABS    Sig: Take 1 tablet by mouth daily as needed.    Dispense:  8 tablet    Refill:  5     Follow-up: Return in about 3 months (around 11/14/2018).  Scarlette Calico, MD

## 2018-08-15 NOTE — Patient Instructions (Signed)

## 2018-08-16 LAB — HIV ANTIBODY (ROUTINE TESTING W REFLEX): HIV: NONREACTIVE

## 2018-08-17 DIAGNOSIS — E118 Type 2 diabetes mellitus with unspecified complications: Secondary | ICD-10-CM | POA: Insufficient documentation

## 2018-08-17 MED ORDER — ATORVASTATIN CALCIUM 40 MG PO TABS: 40.0000 mg | ORAL_TABLET | Freq: Every day | ORAL | 1 refills | Status: DC

## 2018-08-17 NOTE — Assessment & Plan Note (Signed)
His A1c is up to 6.6%.  Medical therapy is not indicated.

## 2018-09-17 ENCOUNTER — Ambulatory Visit (INDEPENDENT_AMBULATORY_CARE_PROVIDER_SITE_OTHER): Payer: Medicare Other

## 2018-09-17 ENCOUNTER — Ambulatory Visit (INDEPENDENT_AMBULATORY_CARE_PROVIDER_SITE_OTHER): Payer: Medicare Other | Admitting: Orthopaedic Surgery

## 2018-09-17 ENCOUNTER — Encounter (INDEPENDENT_AMBULATORY_CARE_PROVIDER_SITE_OTHER): Payer: Self-pay | Admitting: Orthopaedic Surgery

## 2018-09-17 DIAGNOSIS — M65342 Trigger finger, left ring finger: Secondary | ICD-10-CM | POA: Diagnosis not present

## 2018-09-17 DIAGNOSIS — M79642 Pain in left hand: Secondary | ICD-10-CM

## 2018-09-17 DIAGNOSIS — M65341 Trigger finger, right ring finger: Secondary | ICD-10-CM

## 2018-09-17 DIAGNOSIS — I743 Embolism and thrombosis of arteries of the lower extremities: Secondary | ICD-10-CM

## 2018-09-17 NOTE — Progress Notes (Signed)
Will have  Office Visit Note   Patient: Rick Edwards           Date of Birth: 1955-09-05           MRN: [id number] Visit Date: 09/17/2018              Requested by: Janith Lima, MD [address] [place], [place] [address] PCP: Janith Lima, MD   Assessment & Plan: Visit Diagnoses:  1. Pain in left hand   2. Trigger finger, left ring finger   3. Trigger finger, right ring finger     Plan: Patient has bilateral trigger fingers in the ring finger.  Left is worse than the right.  Overall pain is not a significant issue for him.  Xrays today show no acute or structural abnormality. Discussed treatment options to include steroid injections and surgery.  At this time he will continue to monitor and return if he experiences worsening symptoms or more pain and would like to proceed with intervention.  Follow-up as needed  Follow-Up Instructions: Return if symptoms worsen or fail to improve.   Orders:  Orders Placed This Encounter  Procedures  . XR Hand Complete Left   No orders of the defined types were placed in this encounter.     Procedures: No procedures performed   Clinical Data: No additional findings.   Subjective: Chief Complaint  Patient presents with  . Left Hand - Pain    HPI  63 year old male with catching of the left ring finger.  He also notes similar symptoms in the right ring finger but the left is worse.  This is been ongoing for about a year.  Is progressively worsening.  He does occasionally notice mild pain but overall pain is significant issue.  He denies any known injuries.  No erythema.  No swelling.  No numbness or tingling.  He has not tried any previous interventions or bracing.  Occasionally he does wake up and have to forcefully extend his finger. No h/o diabetes   Review of Systems See HPI  Objective: Vital Signs: There were no vitals taken for this visit.  Physical Exam GEN: Awake, alert, no acute  distress Pulmonary: Breathing unlabored  Ortho Exam Left hand: Inspection: No swelling, no erythema. swan-neck deformity of the left little finger. Palpation: no TTP.  Palpable nodule in the flexor tendon of the ring finger ROM: Triggering of the ring finger is consistently reproducible without significant pain there is also triggering of the little finger but to a lesser degree. Strength: 5/5 strength in the forearm, wrist and interosseus muscles Neurovascular: NV intact  Right hand: Inspection: No swelling, no erythema. Palpation: no TTP.  Palpable nodule in the flexor tendon of the ring finger ROM: Triggering of the ring finger is consistently reproducible without significant pain Strength: 5/5 strength in the forearm, wrist and interosseus muscles Neurovascular: NV intact      Specialty Comments:  No specialty comments available.  Imaging: Xr Hand Complete Left  Result Date: 09/17/2018 No acute or structural abnormalities    PMFS History: Patient Active Problem List   Diagnosis Date Noted  . Type II diabetes mellitus with manifestations ([place]) 08/17/2018  . Cutaneous horn 08/15/2018  . Chronic hand pain, left 08/15/2018  . Trigger finger, left ring finger 08/15/2018  . Screening for HIV (human immunodeficiency virus) 08/15/2018  . Verrucous skin lesion 10/29/2017  . Atherosclerosis of aorta ([place]) 09/26/2017  . Benign prostatic hyperplasia without lower urinary  tract symptoms 09/25/2017  . Thrombocytosis ([place]) 07/19/2016  . Drug-induced erectile dysfunction 07/19/2016  . Pure hyperglyceridemia 04/05/2016  . Hyperglycemia 01/18/2016  . Hyperlipidemia with target LDL less than 70 01/18/2016  . Proteinuria 03/13/2014  . Right knee DJD 11/25/2013  . PVD (peripheral vascular disease) ([place]) 09/24/2013  . Encounter for therapeutic drug monitoring 09/16/2013  . Kidney disease, chronic, stage III (GFR 30-59 ml/min) (HCC) 05/26/2013  . Antithrombin III deficiency ([place])  05/16/2013  . Long term (current) use of anticoagulants 11/27/2012  . 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 ([place]) 04/10/2012  . Splenic infarction 03/20/2012  . Routine general medical examination at a health care facility 08/29/2011  . Benign neoplasm of colon 05/06/2009  . CROHN'S DISEASE-LARGE INTESTINE 03/19/2009  . Essential hypertension, malignant 03/02/2009   Past Medical History:  Diagnosis Date  . Anemia    anemia thrombocytopenia, saw Hematology 2011, can not r/o myeloproliferative d/c  . Antithrombin III deficiency ([place]) 05/16/2013   On coumadin for this  . Blood clot in vein    in right leg  . Chronic kidney disease 05/27/2013   ACUTE RENAL FAILURE   . Crohn's disease ([place])    tx. Imuran  . GI bleed 6/11   w/ normal EGD 6/11, 3 PRBCs   . Hearing loss    wears hearing aid left ear; birthed with hearing loss  . HOH (hard of hearing) 05/16/2013  . Hypercalcemia   . Hyperkalemia 05/27/2013  . Hypertension   . Ileostomy in place [facility]) 04-11-13   04-18-13 ileostomy to be taken down.  . Perianal abscess 2001   s/p right hemicolectomy, and drainage of retroperitoneal abcess 2003  . Peripheral vascular disease ([place])   . Transfusion history    last 8'13    Family History  Problem Relation Age of Onset  . Hypertension Mother   . Hyperlipidemia Mother   . Hypertension Father   . Hyperlipidemia Father   . Heart disease Father   . Stroke Unknown        GF, aunts   . Hypertension Unknown        "the whole family"  . Coronary artery disease Neg Hx   . Diabetes Neg Hx   . Colon cancer Neg Hx   . Prostate cancer Neg Hx   . Kidney failure Neg Hx     Past Surgical History:  Procedure Laterality Date  . Anal fistulotomy  2002  . EMBOLECTOMY  03/12/2012   Procedure: EMBOLECTOMY;  Surgeon: Angelia Mould, MD;  [facility];  Service: Vascular;  Laterality: Right;  Right popliteal embolectomy with vein patch angioplasty, right posterior tibial  embolectomy with vein patch angioplasty   . HEMICOLECTOMY  08/29/2001   perforated abscess  . ILEOSTOMY CLOSURE N/A 04/18/2013   Procedure: ILEOSTOMY TAKEDOWN;  Surgeon: Edward Jolly, MD;  Location: [facility] ORS;  Service: General;  Laterality: N/A;  . INTRAOPERATIVE ARTERIOGRAM  03/12/2012   Procedure: INTRA OPERATIVE ARTERIOGRAM;  Surgeon: Angelia Mould, MD;  Location: [place];  Service: Vascular;  Laterality: Right;  . PARTIAL COLECTOMY  03/11/2012   Procedure: PARTIAL COLECTOMY;  Surgeon: Edward Jolly, MD;  Location: [facility] ORS;  Service: General;  Laterality: N/A;  subtotal colectomy transverse and left colon   . TRANSMETATARSAL AMPUTATION  05/10/2012   right   Social History   Occupational History  . Occupation: Post office   Tobacco Use  . Smoking status: Former Smoker    Packs/day: 0.25  Years: 9.00    Pack years: 2.25    Types: Cigarettes    Last attempt to quit: 05/09/2011    Years since quitting: 7.3  . Smokeless tobacco: Never Used  Substance and Sexual Activity  . Alcohol use: No    Alcohol/week: 0.0 standard drinks  . Drug use: Yes    Types: Marijuana  . Sexual activity: Not Currently

## 2018-09-18 ENCOUNTER — Encounter: Payer: Self-pay | Admitting: Internal Medicine

## 2018-09-24 ENCOUNTER — Ambulatory Visit (INDEPENDENT_AMBULATORY_CARE_PROVIDER_SITE_OTHER): Payer: Medicare Other | Admitting: General Practice

## 2018-09-24 DIAGNOSIS — I743 Embolism and thrombosis of arteries of the lower extremities: Secondary | ICD-10-CM

## 2018-09-24 DIAGNOSIS — Z7901 Long term (current) use of anticoagulants: Secondary | ICD-10-CM

## 2018-09-24 LAB — POCT INR: INR: 2.4 (ref 2.0–3.0)

## 2018-09-24 NOTE — Patient Instructions (Addendum)
Pre visit review using our clinic review tool, if applicable. No additional management support is needed unless otherwise documented below in the visit note.  Continue to take 1 tablet all days.  Re-check in 6 weeks.   

## 2018-09-27 DIAGNOSIS — D0439 Carcinoma in situ of skin of other parts of face: Secondary | ICD-10-CM | POA: Diagnosis not present

## 2018-09-27 DIAGNOSIS — B078 Other viral warts: Secondary | ICD-10-CM | POA: Diagnosis not present

## 2018-09-30 DIAGNOSIS — I10 Essential (primary) hypertension: Secondary | ICD-10-CM | POA: Diagnosis not present

## 2018-10-10 DIAGNOSIS — Z6830 Body mass index (BMI) 30.0-30.9, adult: Secondary | ICD-10-CM | POA: Diagnosis not present

## 2018-10-10 DIAGNOSIS — I10 Essential (primary) hypertension: Secondary | ICD-10-CM | POA: Diagnosis not present

## 2018-10-23 ENCOUNTER — Other Ambulatory Visit: Payer: Self-pay | Admitting: Internal Medicine

## 2018-10-23 DIAGNOSIS — E781 Pure hyperglyceridemia: Secondary | ICD-10-CM

## 2018-11-05 ENCOUNTER — Ambulatory Visit (INDEPENDENT_AMBULATORY_CARE_PROVIDER_SITE_OTHER): Payer: Medicare Other | Admitting: General Practice

## 2018-11-05 ENCOUNTER — Other Ambulatory Visit: Payer: Self-pay

## 2018-11-05 DIAGNOSIS — Z7901 Long term (current) use of anticoagulants: Secondary | ICD-10-CM | POA: Diagnosis not present

## 2018-11-05 DIAGNOSIS — I743 Embolism and thrombosis of arteries of the lower extremities: Secondary | ICD-10-CM

## 2018-11-05 LAB — POCT INR: INR: 1.9 — AB (ref 2.0–3.0)

## 2018-11-05 NOTE — Patient Instructions (Addendum)
Pre visit review using our clinic review tool, if applicable. No additional management support is needed unless otherwise documented below in the visit note.  Take 1 1/2 tablets today and then continue to take 1 tablet all days.  Re-check in 6 weeks.

## 2018-11-11 ENCOUNTER — Telehealth: Payer: Self-pay | Admitting: Internal Medicine

## 2018-11-11 NOTE — Telephone Encounter (Signed)
Voicemail message left requesting a [facility] day [facility] but name of medication not specified. Returned call to number provided in voicemail message and his mother called because the pt is hard of hearing and is not available at this time. Requesting refill of Fenofibrate 145 mg tab. Notified pt's mother that refill was sent to pharmacy on 10/23/18 and the pt should not be due for refills until June. Pt's mother states she will get in contact with the pt when he returns to ensure he still has enough medication to last until June.

## 2018-11-14 DIAGNOSIS — Z08 Encounter for follow-up examination after completed treatment for malignant neoplasm: Secondary | ICD-10-CM | POA: Diagnosis not present

## 2018-11-14 DIAGNOSIS — Z85828 Personal history of other malignant neoplasm of skin: Secondary | ICD-10-CM | POA: Diagnosis not present

## 2018-12-10 ENCOUNTER — Ambulatory Visit (INDEPENDENT_AMBULATORY_CARE_PROVIDER_SITE_OTHER): Payer: Medicare Other | Admitting: General Practice

## 2018-12-10 DIAGNOSIS — Z7901 Long term (current) use of anticoagulants: Secondary | ICD-10-CM

## 2018-12-10 DIAGNOSIS — I743 Embolism and thrombosis of arteries of the lower extremities: Secondary | ICD-10-CM

## 2018-12-10 LAB — POCT INR: INR: 2.7 (ref 2.0–3.0)

## 2018-12-10 NOTE — Patient Instructions (Addendum)
Pre visit review using our clinic review tool, if applicable. No additional management support is needed unless otherwise documented below in the visit note.  Continue to take 1 tablet all days.  Re-check in 6 weeks.

## 2018-12-23 ENCOUNTER — Other Ambulatory Visit: Payer: Self-pay | Admitting: Internal Medicine

## 2019-01-21 ENCOUNTER — Ambulatory Visit (INDEPENDENT_AMBULATORY_CARE_PROVIDER_SITE_OTHER): Payer: Medicare Other | Admitting: General Practice

## 2019-01-21 ENCOUNTER — Other Ambulatory Visit: Payer: Self-pay

## 2019-01-21 DIAGNOSIS — Z7901 Long term (current) use of anticoagulants: Secondary | ICD-10-CM

## 2019-01-21 DIAGNOSIS — I743 Embolism and thrombosis of arteries of the lower extremities: Secondary | ICD-10-CM

## 2019-01-21 LAB — POCT INR: INR: 2.8 (ref 2.0–3.0)

## 2019-01-21 NOTE — Patient Instructions (Addendum)
Pre visit review using our clinic review tool, if applicable. No additional management support is needed unless otherwise documented below in the visit note.  Continue to take 1 tablet all days.  Re-check in 6 weeks.

## 2019-01-26 ENCOUNTER — Other Ambulatory Visit: Payer: Self-pay | Admitting: Internal Medicine

## 2019-01-26 DIAGNOSIS — E781 Pure hyperglyceridemia: Secondary | ICD-10-CM

## 2019-01-28 ENCOUNTER — Telehealth: Payer: Self-pay | Admitting: Internal Medicine

## 2019-01-28 ENCOUNTER — Other Ambulatory Visit: Payer: Self-pay | Admitting: Internal Medicine

## 2019-01-28 DIAGNOSIS — E785 Hyperlipidemia, unspecified: Secondary | ICD-10-CM

## 2019-01-28 MED ORDER — ATORVASTATIN CALCIUM 40 MG PO TABS: 40.0000 mg | ORAL_TABLET | Freq: Every day | ORAL | 1 refills | Status: DC

## 2019-01-28 NOTE — Telephone Encounter (Signed)
Medication Refill - Medication: atorvastatin (LIPITOR) 40 MG tablet (Patient stated that pharmacy said that it was too early to fill medication, however patient states that he has not received this medication.)   Has the patient contacted their pharmacy? Yes (Agent: If no, request that the patient contact the pharmacy for the refill.) (Agent: If yes, when and what did the pharmacy advise?)Contact PCP  Preferred Pharmacy (with phone number or street name):  [id number] [place], [place] [phone] (Phone) [phone] (Fax)     Agent: Please be advised that RX refills may take up to 3 business days. We ask that you follow-up with your pharmacy.

## 2019-03-04 ENCOUNTER — Ambulatory Visit (INDEPENDENT_AMBULATORY_CARE_PROVIDER_SITE_OTHER): Payer: Medicare Other | Admitting: General Practice

## 2019-03-04 ENCOUNTER — Other Ambulatory Visit: Payer: Self-pay

## 2019-03-04 DIAGNOSIS — Z7901 Long term (current) use of anticoagulants: Secondary | ICD-10-CM

## 2019-03-04 DIAGNOSIS — I743 Embolism and thrombosis of arteries of the lower extremities: Secondary | ICD-10-CM

## 2019-03-04 LAB — POCT INR: INR: 3.6 — AB (ref 2.0–3.0)

## 2019-03-04 NOTE — Patient Instructions (Addendum)
Pre visit review using our clinic review tool, if applicable. No additional management support is needed unless otherwise documented below in the visit note.  Skip coumadin today and then change dosage and take 1 tablet daily except 1/2 tablet on Wednesdays.  Re-check in 3 weeks.

## 2019-03-06 ENCOUNTER — Telehealth: Payer: Self-pay | Admitting: Internal Medicine

## 2019-03-06 NOTE — Telephone Encounter (Signed)
Pt was called by pharmacy to pick up a medication and it was spironolactone (ALDACTONE) 25 MG tablet  Pt thought that he was suppose to stop this medication and it is on the discontinued list of meds/ please advise if it is DC and inform pharmacy

## 2019-03-06 NOTE — Telephone Encounter (Signed)
Spironolactone and chlorthaladone was discontinued in January.  Contact pt and informed of same.  Contacted pharmacy and informed of same.

## 2019-03-09 ENCOUNTER — Other Ambulatory Visit: Payer: Self-pay | Admitting: Internal Medicine

## 2019-03-09 DIAGNOSIS — E785 Hyperlipidemia, unspecified: Secondary | ICD-10-CM

## 2019-03-25 ENCOUNTER — Other Ambulatory Visit: Payer: Self-pay

## 2019-03-25 ENCOUNTER — Ambulatory Visit (INDEPENDENT_AMBULATORY_CARE_PROVIDER_SITE_OTHER): Payer: Medicare Other | Admitting: General Practice

## 2019-03-25 DIAGNOSIS — Z7901 Long term (current) use of anticoagulants: Secondary | ICD-10-CM | POA: Diagnosis not present

## 2019-03-25 DIAGNOSIS — I743 Embolism and thrombosis of arteries of the lower extremities: Secondary | ICD-10-CM

## 2019-03-25 LAB — POCT INR: INR: 3.6 — AB (ref 2.0–3.0)

## 2019-03-25 NOTE — Patient Instructions (Addendum)
Pre visit review using our clinic review tool, if applicable. No additional management support is needed unless otherwise documented below in the visit note.  Skip coumadin today and then change dosage and take 1 tablet daily except 1/2 tablet on Wednesdays.  Re-check in 3 weeks.

## 2019-03-25 NOTE — Progress Notes (Signed)
I have reviewed and agree.

## 2019-04-18 ENCOUNTER — Ambulatory Visit (INDEPENDENT_AMBULATORY_CARE_PROVIDER_SITE_OTHER): Payer: Medicare Other | Admitting: General Practice

## 2019-04-18 ENCOUNTER — Other Ambulatory Visit: Payer: Self-pay

## 2019-04-18 DIAGNOSIS — I743 Embolism and thrombosis of arteries of the lower extremities: Secondary | ICD-10-CM

## 2019-04-18 DIAGNOSIS — Z7901 Long term (current) use of anticoagulants: Secondary | ICD-10-CM

## 2019-04-18 LAB — POCT INR: INR: 1.9 — AB (ref 2.0–3.0)

## 2019-04-18 NOTE — Patient Instructions (Addendum)
Pre visit review using our clinic review tool, if applicable. No additional management support is needed unless otherwise documented below in the visit note.  Take 1 1/2 tablets today (9/11) and then continue to take 1 tablet daily except 1/2 tablet on Wednesdays.  Re-check in 3 to 4  weeks.

## 2019-04-25 ENCOUNTER — Other Ambulatory Visit: Payer: Self-pay | Admitting: Internal Medicine

## 2019-04-25 DIAGNOSIS — I743 Embolism and thrombosis of arteries of the lower extremities: Secondary | ICD-10-CM

## 2019-04-25 MED ORDER — WARFARIN SODIUM 5 MG PO TABS: ORAL_TABLET | ORAL | 0 refills | Status: DC

## 2019-04-28 ENCOUNTER — Other Ambulatory Visit: Payer: Self-pay | Admitting: Internal Medicine

## 2019-04-28 DIAGNOSIS — E781 Pure hyperglyceridemia: Secondary | ICD-10-CM

## 2019-04-30 ENCOUNTER — Other Ambulatory Visit: Payer: Self-pay | Admitting: Internal Medicine

## 2019-04-30 DIAGNOSIS — E781 Pure hyperglyceridemia: Secondary | ICD-10-CM

## 2019-05-01 ENCOUNTER — Other Ambulatory Visit: Payer: Self-pay | Admitting: Internal Medicine

## 2019-05-01 ENCOUNTER — Telehealth: Payer: Self-pay | Admitting: Internal Medicine

## 2019-05-01 DIAGNOSIS — E781 Pure hyperglyceridemia: Secondary | ICD-10-CM

## 2019-05-01 NOTE — Telephone Encounter (Signed)
rx refill atorvastatin (LIPITOR) 40 MG tablet [address] [place], [place] - [place] [phone] (Phone) [phone] (Fax)

## 2019-05-02 NOTE — Telephone Encounter (Signed)
appt has been made 

## 2019-05-05 ENCOUNTER — Encounter: Payer: Self-pay | Admitting: Internal Medicine

## 2019-05-05 ENCOUNTER — Other Ambulatory Visit: Payer: Self-pay

## 2019-05-05 ENCOUNTER — Ambulatory Visit (INDEPENDENT_AMBULATORY_CARE_PROVIDER_SITE_OTHER): Payer: Medicare Other | Admitting: Internal Medicine

## 2019-05-05 ENCOUNTER — Other Ambulatory Visit (INDEPENDENT_AMBULATORY_CARE_PROVIDER_SITE_OTHER): Payer: Medicare Other

## 2019-05-05 VITALS — BP 160/96 | HR 70 | Temp 99.1°F | Resp 16 | Ht 69.0 in | Wt 214.0 lb

## 2019-05-05 DIAGNOSIS — N400 Enlarged prostate without lower urinary tract symptoms: Secondary | ICD-10-CM

## 2019-05-05 DIAGNOSIS — E785 Hyperlipidemia, unspecified: Secondary | ICD-10-CM | POA: Diagnosis not present

## 2019-05-05 DIAGNOSIS — E118 Type 2 diabetes mellitus with unspecified complications: Secondary | ICD-10-CM

## 2019-05-05 DIAGNOSIS — I10 Essential (primary) hypertension: Secondary | ICD-10-CM

## 2019-05-05 DIAGNOSIS — I743 Embolism and thrombosis of arteries of the lower extremities: Secondary | ICD-10-CM

## 2019-05-05 DIAGNOSIS — D473 Essential (hemorrhagic) thrombocythemia: Secondary | ICD-10-CM | POA: Diagnosis not present

## 2019-05-05 DIAGNOSIS — N183 Chronic kidney disease, stage 3 unspecified: Secondary | ICD-10-CM

## 2019-05-05 DIAGNOSIS — Z23 Encounter for immunization: Secondary | ICD-10-CM | POA: Diagnosis not present

## 2019-05-05 DIAGNOSIS — D75839 Thrombocytosis, unspecified: Secondary | ICD-10-CM

## 2019-05-05 DIAGNOSIS — N522 Drug-induced erectile dysfunction: Secondary | ICD-10-CM

## 2019-05-05 DIAGNOSIS — E781 Pure hyperglyceridemia: Secondary | ICD-10-CM | POA: Diagnosis not present

## 2019-05-05 LAB — CBC WITH DIFFERENTIAL/PLATELET
Basophils Absolute: 0.1 10*3/uL (ref 0.0–0.1)
Basophils Relative: 1.3 % (ref 0.0–3.0)
Eosinophils Absolute: 0.1 10*3/uL (ref 0.0–0.7)
Eosinophils Relative: 2 % (ref 0.0–5.0)
HCT: 45.8 % (ref 39.0–52.0)
Hemoglobin: 15.6 g/dL (ref 13.0–17.0)
Lymphocytes Relative: 28.1 % (ref 12.0–46.0)
Lymphs Abs: 1.8 10*3/uL (ref 0.7–4.0)
MCHC: 34.1 g/dL (ref 30.0–36.0)
MCV: 89.2 fl (ref 78.0–100.0)
Monocytes Absolute: 0.8 10*3/uL (ref 0.1–1.0)
Monocytes Relative: 12.1 % — ABNORMAL HIGH (ref 3.0–12.0)
Neutro Abs: 3.7 10*3/uL (ref 1.4–7.7)
Neutrophils Relative %: 56.5 % (ref 43.0–77.0)
Platelets: 408 10*3/uL — ABNORMAL HIGH (ref 150.0–400.0)
RBC: 5.14 Mil/uL (ref 4.22–5.81)
RDW: 14.1 % (ref 11.5–15.5)
WBC: 6.5 10*3/uL (ref 4.0–10.5)

## 2019-05-05 MED ORDER — IRBESARTAN 150 MG PO TABS: 150.0000 mg | ORAL_TABLET | Freq: Every day | ORAL | 0 refills | Status: DC

## 2019-05-05 MED ORDER — STENDRA 200 MG PO TABS: 1.0000 | ORAL_TABLET | Freq: Every day | ORAL | 5 refills | Status: DC | PRN

## 2019-05-05 MED ORDER — INDAPAMIDE 1.25 MG PO TABS: 1.2500 mg | ORAL_TABLET | Freq: Every day | ORAL | 0 refills | Status: DC

## 2019-05-05 NOTE — Progress Notes (Signed)
Subjective:  Patient ID: Rick Edwards, male    DOB: 10-26-1955  Age: 63 y.o. MRN: [id number]  CC: Hypertension, Hyperlipidemia, and Diabetes   HPI Rick Edwards presents for f/up - He complains of erectile dysfunction but otherwise feels well and offers no other complaints today.  He tells me his blood sugars have been well controlled.  He does not monitor his blood pressure.  Outpatient Medications Prior to Visit  Medication Sig Dispense Refill  . atorvastatin (LIPITOR) 40 MG tablet Take 1 tablet (40 mg total) by mouth daily. 90 tablet 1  . fenofibrate (TRICOR) 145 MG tablet Take 1 tablet by mouth once daily 90 tablet 0  . warfarin (COUMADIN) 5 MG tablet TAKE 1 TABLET BY MOUTH ONCE DAILY OR  TAKE  AS  DIRECTED  BY  ANTICOAGULATION  CLINIC 35 tablet 0  . Avanafil (STENDRA) 200 MG TABS Take 1 tablet by mouth daily as needed. 8 tablet 5   No facility-administered medications prior to visit.     ROS Review of Systems  Constitutional: Negative for appetite change, chills, diaphoresis and fatigue.  HENT: Negative.   Eyes: Negative for visual disturbance.  Respiratory: Negative for cough, chest tightness, shortness of breath and wheezing.   Cardiovascular: Negative for chest pain, palpitations and leg swelling.  Gastrointestinal: Negative for abdominal pain, constipation, diarrhea, nausea and vomiting.  Endocrine: Negative.   Genitourinary: Negative.  Negative for difficulty urinating, hematuria, penile swelling, scrotal swelling, testicular pain and urgency.       +ED  Musculoskeletal: Negative for arthralgias and myalgias.  Skin: Negative.   Neurological: Negative.  Negative for dizziness, weakness, light-headedness and headaches.  Hematological: Negative for adenopathy. Does not bruise/bleed easily.  Psychiatric/Behavioral: Negative.     Objective:  BP (!) 160/96 (BP Location: Left Arm, Patient Position: Sitting, Cuff Size: Normal)   Pulse 70   Temp 99.1 F (37.3 C)  (Oral)   Resp 16   Ht 5\' 9"  (1.753 m)   Wt 214 lb (97.1 kg)   SpO2 96%   BMI 31.60 kg/m   BP Readings from Last 3 Encounters:  05/05/19 (!) 160/96  08/15/18 122/78  04/22/18 108/70    Wt Readings from Last 3 Encounters:  05/05/19 214 lb (97.1 kg)  08/15/18 213 lb 4 oz (96.7 kg)  04/22/18 214 lb 0.6 oz (97.1 kg)    Physical Exam Vitals signs reviewed.  Constitutional:      Appearance: Normal appearance. He is obese. He is not ill-appearing or diaphoretic.  HENT:     Nose: Nose normal.     Mouth/Throat:     Mouth: Mucous membranes are moist.  Eyes:     General: No scleral icterus.    Conjunctiva/sclera: Conjunctivae normal.  Neck:     Musculoskeletal: Normal range of motion and neck supple.  Cardiovascular:     Rate and Rhythm: Normal rate and regular rhythm.     Pulses:          Dorsalis pedis pulses are 1+ on the right side and 1+ on the left side.       Posterior tibial pulses are 1+ on the right side and 1+ on the left side.     Heart sounds: No murmur.  Pulmonary:     Effort: Pulmonary effort is normal.     Breath sounds: No stridor. No wheezing, rhonchi or rales.  Abdominal:     General: Abdomen is flat. Bowel sounds are normal. There is no distension.  Palpations: Abdomen is soft. There is no hepatomegaly or splenomegaly.     Tenderness: There is no abdominal tenderness.     Hernia: There is no hernia in the left inguinal area or right inguinal area.  Genitourinary:    Pubic Area: No rash.      Penis: Normal and circumcised. No discharge, swelling or lesions.      Scrotum/Testes: Normal.        Right: Mass, tenderness or swelling not present.        Left: Mass, tenderness or swelling not present.     Epididymis:     Right: Normal. No mass.     Left: Normal. No mass.     Prostate: Enlarged (1+ smooth symm BPH). Not tender and no nodules present.     Rectum: Normal. Guaiac result negative. No mass, tenderness, anal fissure, external hemorrhoid or  internal hemorrhoid. Normal anal tone.  Musculoskeletal: Normal range of motion.     Right lower leg: No edema.     Left lower leg: No edema.     Right foot: Deformity present.     Left foot: No deformity.  Feet:     Right foot:     Skin integrity: Skin integrity normal.     Toenail Condition: Right toenails are normal.     Left foot:     Skin integrity: Skin integrity normal.     Toenail Condition: Left toenails are normal.  Lymphadenopathy:     Cervical: No cervical adenopathy.     Lower Body: No right inguinal adenopathy. No left inguinal adenopathy.  Skin:    General: Skin is warm and dry.  Neurological:     General: No focal deficit present.     Mental Status: He is alert.  Psychiatric:        Mood and Affect: Mood normal.        Behavior: Behavior normal.     Lab Results  Component Value Date   WBC 6.5 05/05/2019   HGB 15.6 05/05/2019   HCT 45.8 05/05/2019   PLT 408.0 (H) 05/05/2019   GLUCOSE 92 05/05/2019   CHOL 173 05/05/2019   TRIG 250.0 (H) 05/05/2019   HDL 38.90 (L) 05/05/2019   LDLDIRECT 97.0 05/05/2019   LDLCALC 115 (H) 08/15/2018   ALT 17 08/15/2018   AST 14 08/15/2018   NA 137 05/05/2019   K 3.5 05/05/2019   CL 96 05/05/2019   CREATININE 1.15 05/05/2019   BUN 17 05/05/2019   CO2 30 05/05/2019   TSH 2.05 05/05/2019   PSA 5.54 (H) 05/05/2019   INR 1.9 (A) 04/18/2019   HGBA1C 6.0 05/05/2019   MICROALBUR 2.2 (H) 05/05/2019    Dg Lumbar Spine Complete  Result Date: 09/25/2017 CLINICAL DATA:  63 year old male with lower back pain for 2 weeks. Lifted heavy objects. No known injury. Initial encounter. EXAM: LUMBAR SPINE - COMPLETE 4+ VIEW COMPARISON:  05/08/2013 CT abdomen and pelvis. FINDINGS: Normal alignment. Mild to moderate L5-S1 disc space narrowing most notable posteriorly. Mild L5-S1 facet degenerative changes.  No pars defect noted. Vascular calcifications. IMPRESSION: Mild to moderate L5-S1 disc space narrowing (most notable posteriorly).  Aortic Atherosclerosis (ICD10-I70.0). Electronically Signed   By: Genia Del M.D.   On: 09/25/2017 17:12    Assessment & Plan:   Jamus was seen today for hypertension, hyperlipidemia and diabetes.  Diagnoses and all orders for this visit:  Essential hypertension, malignant- His blood pressure is not adequately well controlled.  I have asked him  to add an ARB and thiazide diuretic to his current regimen. -     Basic metabolic panel; Future -     irbesartan (AVAPRO) 150 MG tablet; Take 1 tablet (150 mg total) by mouth daily. -     indapamide (LOZOL) 1.25 MG tablet; Take 1 tablet (1.25 mg total) by mouth daily.  Type II diabetes mellitus with manifestations ([place])- His blood sugars are very well controlled with an A1c of 6.0%. -     Microalbumin / creatinine urine ratio; Future -     Hemoglobin A1c; Future -     HM Diabetes Foot Exam -     irbesartan (AVAPRO) 150 MG tablet; Take 1 tablet (150 mg total) by mouth daily.  Kidney disease, chronic, stage III (GFR 30-59 ml/min) ([place])- His renal function is normal now.  Will continue to modify his risk factors and I have asked him to avoid nephrotoxic agents. -     Basic metabolic panel; Future -     Urinalysis, Routine w reflex microscopic; Future -     irbesartan (AVAPRO) 150 MG tablet; Take 1 tablet (150 mg total) by mouth daily.  Benign prostatic hyperplasia without lower urinary tract symptoms- His PSA has risen but he has no symptoms other than ED.  I have asked him to return in 3 to 4 months to have his PSA rechecked and to avoid ejaculation for a week prior to having the PSA rechecked. -     PSA; Future  Thrombocytosis ([place])- His platelet count remains mildly elevated.  His iron levels are normal.  I do not see any evidence of lymphoproliferative disease.  Will continue to monitor this. -     CBC with Differential/Platelet; Future -     IBC panel; Future -     Ferritin; Future  Hyperlipidemia with target LDL less than 70- He has  achieved his LDL goal and is doing well on the statin. -     Lipid panel; Future -     TSH; Future  Need for influenza vaccination -     Flu Vaccine QUAD 36+ mos IM  Drug-induced erectile dysfunction -     Avanafil (STENDRA) 200 MG TABS; Take 1 tablet by mouth daily as needed.  Pure hypertriglyceridemia- I have asked him to start taking icosapent ethyl for CV risk reduction. -     Icosapent Ethyl (VASCEPA) 1 g CAPS; Take 2 capsules (2 g total) by mouth 2 (two) times daily.   I am having Delaney Meigs. [place] start on irbesartan, indapamide, and Vascepa. I am also having him maintain his fenofibrate, atorvastatin, warfarin, and Stendra.  Meds ordered this encounter  Medications  . Avanafil (STENDRA) 200 MG TABS    Sig: Take 1 tablet by mouth daily as needed.    Dispense:  8 tablet    Refill:  5  . irbesartan (AVAPRO) 150 MG tablet    Sig: Take 1 tablet (150 mg total) by mouth daily.    Dispense:  90 tablet    Refill:  0  . indapamide (LOZOL) 1.25 MG tablet    Sig: Take 1 tablet (1.25 mg total) by mouth daily.    Dispense:  90 tablet    Refill:  0  . Icosapent Ethyl (VASCEPA) 1 g CAPS    Sig: Take 2 capsules (2 g total) by mouth 2 (two) times daily.    Dispense:  360 capsule    Refill:  1     Follow-up: Return in about  2 months (around 07/05/2019).  Scarlette Calico, MD

## 2019-05-05 NOTE — Patient Instructions (Signed)

## 2019-05-06 ENCOUNTER — Encounter: Payer: Self-pay | Admitting: Internal Medicine

## 2019-05-06 LAB — BASIC METABOLIC PANEL
BUN: 17 mg/dL (ref 6–23)
CO2: 30 mEq/L (ref 19–32)
Calcium: 10.1 mg/dL (ref 8.4–10.5)
Chloride: 96 mEq/L (ref 96–112)
Creatinine, Ser: 1.15 mg/dL (ref 0.40–1.50)
GFR: 78.25 mL/min (ref >60.00)
Glucose, Bld: 92 mg/dL (ref 70–99)
Potassium: 3.5 mEq/L (ref 3.5–5.1)
Sodium: 137 mEq/L (ref 135–145)

## 2019-05-06 LAB — HEMOGLOBIN A1C: Hgb A1c MFr Bld: 6 % (ref 4.6–6.5)

## 2019-05-06 LAB — URINALYSIS, ROUTINE W REFLEX MICROSCOPIC
Bilirubin Urine: NEGATIVE
Hgb urine dipstick: NEGATIVE
Ketones, ur: NEGATIVE
Leukocytes,Ua: NEGATIVE
Nitrite: NEGATIVE
RBC / HPF: NONE SEEN (ref 0–?)
Specific Gravity, Urine: 1.015 (ref 1.000–1.030)
Total Protein, Urine: NEGATIVE
Urine Glucose: NEGATIVE
Urobilinogen, UA: 0.2 (ref 0.0–1.0)
pH: 8 (ref 5.0–8.0)

## 2019-05-06 LAB — IBC PANEL
Iron: 67 ug/dL (ref 42–165)
Saturation Ratios: 16.8 % — ABNORMAL LOW (ref 20.0–50.0)
Transferrin: 285 mg/dL (ref 212.0–360.0)

## 2019-05-06 LAB — FERRITIN: Ferritin: 273.6 ng/mL (ref 22.0–322.0)

## 2019-05-06 LAB — LDL CHOLESTEROL, DIRECT: Direct LDL: 97 mg/dL

## 2019-05-06 LAB — MICROALBUMIN / CREATININE URINE RATIO
Creatinine,U: 110.1 mg/dL
Microalb Creat Ratio: 2 mg/g (ref 0.0–30.0)
Microalb, Ur: 2.2 mg/dL — ABNORMAL HIGH (ref 0.0–1.9)

## 2019-05-06 LAB — PSA: PSA: 5.54 ng/mL — ABNORMAL HIGH (ref 0.10–4.00)

## 2019-05-06 LAB — LIPID PANEL
Cholesterol: 173 mg/dL (ref 0–200)
HDL: 38.9 mg/dL — ABNORMAL LOW (ref >39.00)
NonHDL: 134.54
Total CHOL/HDL Ratio: 4
Triglycerides: 250 mg/dL — ABNORMAL HIGH (ref 0.0–149.0)
VLDL: 50 mg/dL — ABNORMAL HIGH (ref 0.0–40.0)

## 2019-05-06 LAB — TSH: TSH: 2.05 u[IU]/mL (ref 0.35–4.50)

## 2019-05-06 MED ORDER — VASCEPA 1 G PO CAPS: 2.0000 | ORAL_CAPSULE | Freq: Two times a day (BID) | ORAL | 1 refills | Status: DC

## 2019-05-16 ENCOUNTER — Other Ambulatory Visit: Payer: Self-pay | Admitting: Internal Medicine

## 2019-05-16 ENCOUNTER — Ambulatory Visit: Payer: Medicare Other

## 2019-05-16 DIAGNOSIS — I743 Embolism and thrombosis of arteries of the lower extremities: Secondary | ICD-10-CM

## 2019-05-23 ENCOUNTER — Other Ambulatory Visit: Payer: Self-pay

## 2019-05-23 ENCOUNTER — Ambulatory Visit (INDEPENDENT_AMBULATORY_CARE_PROVIDER_SITE_OTHER): Payer: Medicare Other | Admitting: General Practice

## 2019-05-23 DIAGNOSIS — I743 Embolism and thrombosis of arteries of the lower extremities: Secondary | ICD-10-CM

## 2019-05-23 DIAGNOSIS — Z7901 Long term (current) use of anticoagulants: Secondary | ICD-10-CM

## 2019-05-23 LAB — POCT INR: INR: 1.9 — AB (ref 2.0–3.0)

## 2019-05-23 NOTE — Patient Instructions (Addendum)
Pre visit review using our clinic review tool, if applicable. No additional management support is needed unless otherwise documented below in the visit note.  Change dosage and take 1 tablet daily.  Re-check in 4 weeks.  

## 2019-05-23 NOTE — Progress Notes (Signed)
Medical screening examination/treatment/procedure(s) were performed by non-physician practitioner and as supervising physician I was immediately available for consultation/collaboration. I agree with above. James John, MD   

## 2019-06-11 ENCOUNTER — Telehealth: Payer: Self-pay

## 2019-06-11 NOTE — Telephone Encounter (Signed)
Copied from [place] [id number]. Topic: General - Other >> Jun 11, 2019 10:12 AM Leward Quan A wrote: Reason for CRM: Patient wife called to say that he is having a hard time to pay for the Icosapent Ethyl (VASCEPA) 1 g CAPS say that it cost $80 and he cant afford it and need a different Rx sent in. Please advise  [place], [place] [phone] (Phone) [phone] (Fax) >> Jun 11, 2019 10:56 AM Morey Hummingbird wrote: Samples given to patient, please advise if med should be changed

## 2019-06-11 NOTE — Telephone Encounter (Signed)
If you want pt to stay on the [place] let me know. I will look into patient assistance for him.

## 2019-06-11 NOTE — Telephone Encounter (Signed)
pls apply for pt assistance  TJ

## 2019-06-13 NOTE — Telephone Encounter (Signed)
Spoke to pt mother (pt does not hear well).   Pt mother stated that she was not sure if the cost was for a 30 or 90. I informed that the cost is for a 90 day. She is going to call the pharmacy to see if they will dispense a 30 or 60 day supply. She will call me back if it is still not affordable.    There is no patient assistant for the Vascepa and [employer] does not have any options either.

## 2019-06-20 ENCOUNTER — Ambulatory Visit (INDEPENDENT_AMBULATORY_CARE_PROVIDER_SITE_OTHER): Payer: Medicare Other | Admitting: General Practice

## 2019-06-20 DIAGNOSIS — Z7901 Long term (current) use of anticoagulants: Secondary | ICD-10-CM | POA: Diagnosis not present

## 2019-06-20 DIAGNOSIS — I743 Embolism and thrombosis of arteries of the lower extremities: Secondary | ICD-10-CM

## 2019-06-20 LAB — POCT INR: INR: 1.9 — AB (ref 2.0–3.0)

## 2019-06-20 NOTE — Progress Notes (Signed)
Medical screening examination/treatment/procedure(s) were performed by non-physician practitioner and as supervising physician I was immediately available for consultation/collaboration. I agree with above. Nashton Belson, MD   

## 2019-06-20 NOTE — Patient Instructions (Addendum)
Pre visit review using our clinic review tool, if applicable. No additional management support is needed unless otherwise documented below in the visit note.  Take 1 1/2 tablets today (11/13) and then change dosage and take 1 tablet daily except 1 1/2 tablets on Wednesdays.  Re-check in 4 weeks.

## 2019-07-02 ENCOUNTER — Other Ambulatory Visit: Payer: Self-pay

## 2019-07-02 ENCOUNTER — Ambulatory Visit (HOSPITAL_BASED_OUTPATIENT_CLINIC_OR_DEPARTMENT_OTHER)
Admission: RE | Admit: 2019-07-02 | Discharge: 2019-07-02 | Disposition: A | Discharge status: Home / Self Care | Payer: Medicare Other | Source: Ambulatory Visit | Attending: Internal Medicine | Admitting: Internal Medicine

## 2019-07-02 ENCOUNTER — Encounter: Payer: Self-pay | Admitting: Family Medicine

## 2019-07-02 ENCOUNTER — Ambulatory Visit: Payer: Self-pay

## 2019-07-02 ENCOUNTER — Ambulatory Visit (INDEPENDENT_AMBULATORY_CARE_PROVIDER_SITE_OTHER): Payer: Medicare Other | Admitting: Family Medicine

## 2019-07-02 VITALS — BP 164/100 | Ht 69.0 in | Wt 210.0 lb

## 2019-07-02 DIAGNOSIS — M1711 Unilateral primary osteoarthritis, right knee: Secondary | ICD-10-CM | POA: Diagnosis not present

## 2019-07-02 DIAGNOSIS — G8929 Other chronic pain: Secondary | ICD-10-CM | POA: Diagnosis not present

## 2019-07-02 DIAGNOSIS — M1712 Unilateral primary osteoarthritis, left knee: Secondary | ICD-10-CM | POA: Diagnosis not present

## 2019-07-02 DIAGNOSIS — M170 Bilateral primary osteoarthritis of knee: Secondary | ICD-10-CM | POA: Diagnosis not present

## 2019-07-02 DIAGNOSIS — M25561 Pain in right knee: Secondary | ICD-10-CM | POA: Diagnosis not present

## 2019-07-02 DIAGNOSIS — M79642 Pain in left hand: Secondary | ICD-10-CM | POA: Insufficient documentation

## 2019-07-02 MED ORDER — METHYLPREDNISOLONE ACETATE 40 MG/ML IJ SUSP
40.0000 mg | Freq: Once | INTRAMUSCULAR | Status: AC
  Administered 2019-07-02: 40 mg via INTRA_ARTICULAR

## 2019-07-02 NOTE — Progress Notes (Signed)
Rick Edwards - 63 y.o. male MRN [id number]  Date of birth: 14-Jul-1956  SUBJECTIVE:  Including CC & ROS.  No chief complaint on file.   Rick Edwards is a 63 y.o. male that is presenting with acute on chronic bilateral knee pain.  The right knee is worse than the left knee.  The pain is been ongoing over the past few weeks.  It was worse when he is mowing the yard this weekend.  The pain is anterior medial in nature.  There is some popping.  He does not take any medications on a regular basis.  He tries to stay active but the pain limits this.  The left knee woke him up the other night from his sleep.  Pain is intermittent.  Pain can be moderate to severe.  Seems to be localized to the knee.  No inciting event or trauma.  No history of surgery.  Has tried steroid injections in the past.    Review of Systems  Constitutional: Negative for fever.  HENT: Negative for congestion.   Respiratory: Negative for cough.   Cardiovascular: Negative for chest pain.  Gastrointestinal: Negative for abdominal pain.  Musculoskeletal: Positive for arthralgias and joint swelling.  Skin: Negative for color change.  Neurological: Negative for weakness.  Hematological: Negative for adenopathy.    HISTORY: Past Medical, Surgical, Social, and Family History Reviewed & Updated per EMR.   Pertinent Historical Findings include:  Past Medical History:  Diagnosis Date  . Anemia    anemia thrombocytopenia, saw Hematology 2011, can not r/o myeloproliferative d/c  . Antithrombin III deficiency ([place]) 05/16/2013   On coumadin for this  . Blood clot in vein    in right leg  . Chronic kidney disease 05/27/2013   ACUTE RENAL FAILURE   . Crohn's disease ([place])    tx. Imuran  . GI bleed 6/11   w/ normal EGD 6/11, 3 PRBCs   . Hearing loss    wears hearing aid left ear; birthed with hearing loss  . HOH (hard of hearing) 05/16/2013  . Hypercalcemia   . Hyperkalemia 05/27/2013  . Hypertension   . Ileostomy  in place [facility]) 04-11-13   04-18-13 ileostomy to be taken down.  . Perianal abscess 2001   s/p right hemicolectomy, and drainage of retroperitoneal abcess 2003  . Peripheral vascular disease ([place])   . Transfusion history    last 8'13    Past Surgical History:  Procedure Laterality Date  . Anal fistulotomy  2002  . EMBOLECTOMY  03/12/2012   Procedure: EMBOLECTOMY;  Surgeon: Angelia Mould, MD;  [facility] OR;  Service: Vascular;  Laterality: Right;  Right popliteal embolectomy with vein patch angioplasty, right posterior tibial embolectomy with vein patch angioplasty   . HEMICOLECTOMY  08/29/2001   perforated abscess  . ILEOSTOMY CLOSURE N/A 04/18/2013   Procedure: ILEOSTOMY TAKEDOWN;  Surgeon: Edward Jolly, MD;  Location: [facility] ORS;  Service: General;  Laterality: N/A;  . INTRAOPERATIVE ARTERIOGRAM  03/12/2012   Procedure: INTRA OPERATIVE ARTERIOGRAM;  Surgeon: Angelia Mould, MD;  Location: [place];  Service: Vascular;  Laterality: Right;  . PARTIAL COLECTOMY  03/11/2012   Procedure: PARTIAL COLECTOMY;  Surgeon: Edward Jolly, MD;  Location: [facility] ORS;  Service: General;  Laterality: N/A;  subtotal colectomy transverse and left colon   . TRANSMETATARSAL AMPUTATION  05/10/2012   right    Allergies  Allergen Reactions  . Mesalamine Rash    REACTION: Rash    Family  History  Problem Relation Age of Onset  . Hypertension Mother   . Hyperlipidemia Mother   . Hypertension Father   . Hyperlipidemia Father   . Heart disease Father   . Stroke Unknown        GF, aunts   . Hypertension Unknown        "the whole family"  . Coronary artery disease Neg Hx   . Diabetes Neg Hx   . Colon cancer Neg Hx   . Prostate cancer Neg Hx   . Kidney failure Neg Hx      Social History   Socioeconomic History  . Marital status: Single    Spouse name: Not on file  . Number of children: Not on file  . Years of education: Not on file  . Highest education level: Not on file   Occupational History  . Occupation: Marine scientist   Social Needs  . Financial resource strain: Not on file  . Food insecurity    Worry: Not on file    Inability: Not on file  . Transportation needs    Medical: Not on file    Non-medical: Not on file  Tobacco Use  . Smoking status: Former Smoker    Packs/day: 0.25    Years: 9.00    Pack years: 2.25    Types: Cigarettes    Quit date: 05/09/2011    Years since quitting: 8.1  . Smokeless tobacco: Never Used  Substance and Sexual Activity  . Alcohol use: No    Alcohol/week: 0.0 standard drinks  . Drug use: Yes    Types: Marijuana  . Sexual activity: Not Currently  Lifestyle  . Physical activity    Days per week: Not on file    Minutes per session: Not on file  . Stress: Not on file  Relationships  . Social Herbalist on phone: Not on file    Gets together: Not on file    Attends religious service: Not on file    Active member of club or organization: Not on file    Attends meetings of clubs or organizations: Not on file    Relationship status: Not on file  . Intimate partner violence    Fear of current or ex partner: Not on file    Emotionally abused: Not on file    Physically abused: Not on file    Forced sexual activity: Not on file  Other Topics Concern  . Not on file  Social History Narrative   Lives by himself   Regular exercise- no            PHYSICAL EXAM:  VS: BP (!) 164/100   Ht 5\' 9"  (1.753 m)   Wt 210 lb (95.3 kg)   BMI 31.01 kg/m  Physical Exam Gen: NAD, alert, cooperative with exam, well-appearing ENT: normal lips, normal nasal mucosa,  Eye: normal EOM, normal conjunctiva and lids CV:  no edema, +2 pedal pulses   Resp: no accessory muscle use, non-labored,   Skin: no rashes, no areas of induration  Neuro: normal tone, normal sensation to touch Psych:  normal insight, alert and oriented MSK:  Right and left knee:  Right knee effusion  Normal ROM  Normal strength to resistance   Instability with valgus and varus stress testing  Negative McMurray's test  No pain with patellar grind  NVI    Aspiration/Injection Procedure Note KAW OSCARSON [birth year]  Procedure: Injection Indications: Left knee pain  Procedure Details  Consent: Risks of procedure as well as the alternatives and risks of each were explained to the (patient/caregiver).  Consent for procedure obtained. Time Out: Verified patient identification, verified procedure, site/side was marked, verified correct patient position, special equipment/implants available, medications/allergies/relevent history reviewed, required imaging and test results available.  Performed.  The area was cleaned with iodine and alcohol swabs.    The left knee superior lateral suprapatellar pouch was injected using 1 cc's of 40 mg Depo-Medrol and 4 cc's of 0.25% bupivacaine with a 22 1 1/2" needle.  Ultrasound was used. Images were obtained in long views showing the injection.     A sterile dressing was applied.  Patient did tolerate procedure well.   Aspiration/Injection Procedure Note Axeton Mure [id number] 1955-11-07  Procedure: Injection Indications: Right knee pain  Procedure Details Consent: Risks of procedure as well as the alternatives and risks of each were explained to the (patient/caregiver).  Consent for procedure obtained. Time Out: Verified patient identification, verified procedure, site/side was marked, verified correct patient position, special equipment/implants available, medications/allergies/relevent history reviewed, required imaging and test results available.  Performed.  The area was cleaned with iodine and alcohol swabs.    The right knee superior lateral suprapatellar pouch was injected using 1 cc's of 40 mg Depo-Medrol and 4 cc's of 0.25% bupivacaine with a 22 1 1/2" needle.  Ultrasound was used. Images were obtained in long views showing the injection.     A sterile dressing was applied.   Patient did tolerate procedure well.     ASSESSMENT & PLAN:   OA (osteoarthritis) of knee Acute on chronic in nature.  Right knee seems to be worse at this time.  Has tried steroid injections in the past.  Pain likely related to underlying degenerative changes. -Bilateral injections today. -Counseled on home exercise therapy and supportive care. -X-rays. -Could consider medial unloader brace due to thigh to calf ratio. -Could consider physical therapy or gel injections.

## 2019-07-02 NOTE — Patient Instructions (Signed)
Nice to meet you Please try ice  Please try tylenol  Please try the exercises  Please try over the counter voltaren   Please send me a message in MyChart with any questions or updates.  Please see me back in 4 weeks.   --Dr. Raeford Razor

## 2019-07-02 NOTE — Assessment & Plan Note (Signed)
Acute on chronic in nature.  Right knee seems to be worse at this time.  Has tried steroid injections in the past.  Pain likely related to underlying degenerative changes. -Bilateral injections today. -Counseled on home exercise therapy and supportive care. -X-rays. -Could consider medial unloader brace due to thigh to calf ratio. -Could consider physical therapy or gel injections.

## 2019-07-07 ENCOUNTER — Telehealth: Payer: Self-pay | Admitting: Family Medicine

## 2019-07-07 ENCOUNTER — Encounter (HOSPITAL_COMMUNITY): Payer: Self-pay | Admitting: Emergency Medicine

## 2019-07-07 ENCOUNTER — Other Ambulatory Visit: Payer: Self-pay

## 2019-07-07 ENCOUNTER — Ambulatory Visit (HOSPITAL_COMMUNITY)
Admission: EM | Admit: 2019-07-07 | Discharge: 2019-07-07 | Disposition: A | Discharge status: Home / Self Care | Payer: Medicare Other | Attending: Family Medicine | Admitting: Family Medicine

## 2019-07-07 DIAGNOSIS — R002 Palpitations: Secondary | ICD-10-CM | POA: Diagnosis not present

## 2019-07-07 DIAGNOSIS — I10 Essential (primary) hypertension: Secondary | ICD-10-CM

## 2019-07-07 LAB — COMPREHENSIVE METABOLIC PANEL
ALT: 32 U/L (ref 0–44)
AST: 19 U/L (ref 15–41)
Albumin: 3.4 g/dL — ABNORMAL LOW (ref 3.5–5.0)
Alkaline Phosphatase: 71 U/L (ref 38–126)
Anion gap: 10 (ref 5–15)
BUN: 11 mg/dL (ref 8–23)
CO2: 33 mmol/L — ABNORMAL HIGH (ref 22–32)
Calcium: 9.5 mg/dL (ref 8.9–10.3)
Chloride: 96 mmol/L — ABNORMAL LOW (ref 98–111)
Creatinine, Ser: 0.98 mg/dL (ref 0.61–1.24)
GFR calc Af Amer: >60 mL/min (ref >60)
GFR calc non Af Amer: >60 mL/min (ref >60)
Glucose, Bld: 104 mg/dL — ABNORMAL HIGH (ref 70–99)
Potassium: 3.3 mmol/L — ABNORMAL LOW (ref 3.5–5.1)
Sodium: 139 mmol/L (ref 135–145)
Total Bilirubin: 1.1 mg/dL (ref 0.3–1.2)
Total Protein: 6.5 g/dL (ref 6.5–8.1)

## 2019-07-07 MED ORDER — METOPROLOL SUCCINATE ER 50 MG PO TB24: 50.0000 mg | ORAL_TABLET | Freq: Every day | ORAL | 1 refills | Status: DC

## 2019-07-07 NOTE — ED Provider Notes (Signed)
. [place]    CSN: [id number] Arrival date & time: 07/07/19  1234      History   Chief Complaint Chief Complaint  Patient presents with  . Palpitations  . Medication Reaction    HPI Rick Edwards is a 63 y.o. male.   This is the initial Rick Edwards urgent care visit for this 63 year old man with palpitations, elevated blood pressure, and recent change in his hypertensive medications with addition of indapamide.  Patient explains that just every once in a while he feels a extra beat in his chest which is disconcerting.  He is having no swelling of the legs, no shortness of breath, no significant chest pain.      Past Medical History:  Diagnosis Date  . Anemia    anemia thrombocytopenia, saw Hematology 2011, can not r/o myeloproliferative d/c  . Antithrombin III deficiency ([place]) 05/16/2013   On coumadin for this  . Blood clot in vein    in right leg  . Chronic kidney disease 05/27/2013   ACUTE RENAL FAILURE   . Crohn's disease ([place])    tx. Imuran  . GI bleed 6/11   w/ normal EGD 6/11, 3 PRBCs   . Hearing loss    wears hearing aid left ear; birthed with hearing loss  . HOH (hard of hearing) 05/16/2013  . Hypercalcemia   . Hyperkalemia 05/27/2013  . Hypertension   . Ileostomy in place [facility]) 04-11-13   04-18-13 ileostomy to be taken down.  . Perianal abscess 2001   s/p right hemicolectomy, and drainage of retroperitoneal abcess 2003  . Peripheral vascular disease ([place])   . Transfusion history    last 8'13    Patient Active Problem List   Diagnosis Date Noted  . Type II diabetes mellitus with manifestations ([place]) 08/17/2018  . Verrucous skin lesion 10/29/2017  . Atherosclerosis of aorta ([place]) 09/26/2017  . Thrombocytosis ([place]) 07/19/2016  . Drug-induced erectile dysfunction 07/19/2016  . Pure hypertriglyceridemia 04/05/2016  . Hyperlipidemia with target LDL less than 70 01/18/2016  . Proteinuria 03/13/2014  . OA (osteoarthritis) of knee  11/25/2013  . PVD (peripheral vascular disease) ([place]) 09/24/2013  . Encounter for therapeutic drug monitoring 09/16/2013  . Kidney disease, chronic, stage III (GFR 30-59 ml/min) 05/26/2013  . Antithrombin III deficiency ([place]) 05/16/2013  . Long term (current) use of anticoagulants 11/27/2012  . 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 ([place]) 04/10/2012  . Splenic infarction 03/20/2012  . Routine general medical examination at a health care facility 08/29/2011  . Benign neoplasm of colon 05/06/2009  . CROHN'S DISEASE-LARGE INTESTINE 03/19/2009  . Essential hypertension, malignant 03/02/2009    Past Surgical History:  Procedure Laterality Date  . Anal fistulotomy  2002  . EMBOLECTOMY  03/12/2012   Procedure: EMBOLECTOMY;  Surgeon: Angelia Mould, MD;  Location: [facility] OR;  Service: Vascular;  Laterality: Right;  Right popliteal embolectomy with vein patch angioplasty, right posterior tibial embolectomy with vein patch angioplasty   . HEMICOLECTOMY  08/29/2001   perforated abscess  . ILEOSTOMY CLOSURE N/A 04/18/2013   Procedure: ILEOSTOMY TAKEDOWN;  Surgeon: Edward Jolly, MD;  Location: [facility];  Service: General;  Laterality: N/A;  . INTRAOPERATIVE ARTERIOGRAM  03/12/2012   Procedure: INTRA OPERATIVE ARTERIOGRAM;  Surgeon: Angelia Mould, MD;  Location: [place];  Service: Vascular;  Laterality: Right;  . PARTIAL COLECTOMY  03/11/2012   Procedure: PARTIAL COLECTOMY;  Surgeon: Edward Jolly, MD;  Location: [facility];  Service: General;  Laterality: N/A;  subtotal colectomy transverse and left colon   . TRANSMETATARSAL AMPUTATION  05/10/2012   right       Home Medications    Prior to Admission medications   Medication Sig Start Date End Date Taking? Authorizing Provider  atorvastatin (LIPITOR) 40 MG tablet Take 1 tablet (40 mg total) by mouth daily. 01/28/19  Yes Janith Lima, MD  Icosapent Ethyl (VASCEPA) 1 g CAPS Take 2 capsules (2 g total) by mouth 2  (two) times daily. 05/06/19  Yes Janith Lima, MD  irbesartan (AVAPRO) 150 MG tablet Take 1 tablet (150 mg total) by mouth daily. 05/05/19  Yes Janith Lima, MD  warfarin (COUMADIN) 5 MG tablet TAKE 1 TABLET BY MOUTH ONCE DAILY OR  TAKE  AS  DIRECTED  BY  ANTICOAGULATION  CLINIC 05/16/19  Yes Janith Lima, MD  indapamide (LOZOL) 1.25 MG tablet Take 1 tablet (1.25 mg total) by mouth daily. 05/05/19 07/07/19 Yes Janith Lima, MD  Avanafil (STENDRA) 200 MG TABS Take 1 tablet by mouth daily as needed. 05/05/19   Janith Lima, MD  fenofibrate (TRICOR) 145 MG tablet Take 1 tablet by mouth once daily 01/27/19   Janith Lima, MD  metoprolol succinate (TOPROL XL) 50 MG 24 hr tablet Take 1 tablet (50 mg total) by mouth daily. Take with or immediately following a meal. 07/07/19   Robyn Haber, MD    Family History Family History  Problem Relation Age of Onset  . Hypertension Mother   . Hyperlipidemia Mother   . Hypertension Father   . Hyperlipidemia Father   . Heart disease Father   . Stroke Other        GF, aunts   . Hypertension Other        "the whole family"  . Coronary artery disease Neg Hx   . Diabetes Neg Hx   . Colon cancer Neg Hx   . Prostate cancer Neg Hx   . Kidney failure Neg Hx     Social History Social History   Tobacco Use  . Smoking status: Former Smoker    Packs/day: 0.25    Years: 9.00    Pack years: 2.25    Types: Cigarettes    Quit date: 05/09/2011    Years since quitting: 8.1  . Smokeless tobacco: Never Used  Substance Use Topics  . Alcohol use: No    Alcohol/week: 0.0 standard drinks  . Drug use: Yes    Types: Marijuana     Allergies   Mesalamine   Review of Systems Review of Systems  Respiratory: Negative.   Cardiovascular: Positive for palpitations.  Gastrointestinal: Negative.   All other systems reviewed and are negative.    Physical Exam Triage Vital Signs ED Triage Vitals  Enc Vitals Group     BP 07/07/19 1355 (!)  176/100     Pulse Rate 07/07/19 1355 84     Resp 07/07/19 1355 (!) 22     Temp 07/07/19 1355 99.1 F (37.3 C)     Temp Source 07/07/19 1355 Oral     SpO2 07/07/19 1355 98 %     Weight --      Height --      Head Circumference --      Peak Flow --      Pain Score 07/07/19 1351 0     Pain Loc --      Pain Edu? --      Excl. in [place]? --  No data found.  Updated Vital Signs BP (!) 176/100 (BP Location: Right Arm) Comment (BP Location): large cuff  Pulse 88   Temp 99.1 F (37.3 C) (Oral)   Resp (!) 22   SpO2 98%    Physical Exam Vitals signs and nursing note reviewed.  Constitutional:      General: He is not in acute distress.    Appearance: Normal appearance.  HENT:     Head: Normocephalic.  Eyes:     Conjunctiva/sclera: Conjunctivae normal.  Neck:     Musculoskeletal: Normal range of motion and neck supple.  Cardiovascular:     Rate and Rhythm: Normal rate.     Heart sounds: Normal heart sounds.  Pulmonary:     Effort: Pulmonary effort is normal.     Breath sounds: Normal breath sounds.  Musculoskeletal: Normal range of motion.  Skin:    General: Skin is warm and dry.  Neurological:     General: No focal deficit present.     Mental Status: He is alert.  Psychiatric:        Mood and Affect: Mood normal.      UC Treatments / Results  Labs (all labs ordered are listed, but only abnormal results are displayed) Labs Reviewed  COMPREHENSIVE METABOLIC PANEL    EKG   Radiology No results found.  Procedures Procedures (including critical care time)  Medications Ordered in UC Medications - No data to display  Initial Impression / Assessment and Plan / UC Course  I have reviewed the triage vital signs and the nursing notes.  Pertinent labs & imaging results that were available during my care of the patient were reviewed by me and considered in my medical decision making (see chart for details).    Final Clinical Impressions(s) / UC Diagnoses    Final diagnoses:  Palpitations   Discharge Instructions   None    ED Prescriptions    Medication Sig Dispense Auth. Provider   metoprolol succinate (TOPROL XL) 50 MG 24 hr tablet Take 1 tablet (50 mg total) by mouth daily. Take with or immediately following a meal. 90 tablet Robyn Haber, MD     PDMP not reviewed this encounter.   Robyn Haber, MD 07/07/19 1409

## 2019-07-07 NOTE — Telephone Encounter (Signed)
Left VM for patient. If he calls back please have him speak with a nurse/CMA and inform that he has severe degenerative changes. The right knee is worse than the left on xrays. We can try a medial unloader brace on the right if he would like to try that.   If any questions then please take the best time and phone number to call and I will try to call him back.   Rosemarie Ax, MD [facility] 07/07/2019, 8:51 AM

## 2019-07-07 NOTE — Discharge Instructions (Signed)
Stop the indapamide and pick up your new prescription.  Follow up in 3-4 weeks.

## 2019-07-07 NOTE — ED Triage Notes (Signed)
Last week had medication changed, see medication list.  Changed meds due to blood pressure being up.  Reports amlodipine and atorvastatin where stopped and several new medicines started.   Patient thinks heart beats fast when lying down.  Denies fast heart beat currently.  Patient did not notify provider of changes in how he feels since changing medicine  "my whole body changed since going on that medicine"

## 2019-07-09 ENCOUNTER — Telehealth: Payer: Self-pay | Admitting: Family Medicine

## 2019-07-09 NOTE — Telephone Encounter (Signed)
Patient returning call for xray results  

## 2019-07-09 NOTE — Telephone Encounter (Signed)
Spoke to patient and gave him xray results information as provided by the physician. Patient would like to do the medial unloader brace.

## 2019-07-10 ENCOUNTER — Telehealth: Payer: Self-pay | Admitting: Emergency Medicine

## 2019-07-10 NOTE — Telephone Encounter (Signed)
Patient contacted by phone and made aware of   lab results and provider instuction. Pt verbalized understanding and had all questions answered.

## 2019-07-18 ENCOUNTER — Other Ambulatory Visit: Payer: Self-pay

## 2019-07-18 ENCOUNTER — Ambulatory Visit (INDEPENDENT_AMBULATORY_CARE_PROVIDER_SITE_OTHER): Payer: Medicare Other | Admitting: General Practice

## 2019-07-18 DIAGNOSIS — Z7901 Long term (current) use of anticoagulants: Secondary | ICD-10-CM

## 2019-07-18 DIAGNOSIS — I743 Embolism and thrombosis of arteries of the lower extremities: Secondary | ICD-10-CM

## 2019-07-18 LAB — POCT INR: INR: 1.9 — AB (ref 2.0–3.0)

## 2019-07-18 NOTE — Progress Notes (Signed)
Medical screening examination/treatment/procedure(s) were performed by non-physician practitioner and as supervising physician I was immediately available for consultation/collaboration. I agree with above. Saralynn Langhorst, MD   

## 2019-07-18 NOTE — Patient Instructions (Signed)
Pre visit review using our clinic review tool, if applicable. No additional management support is needed unless otherwise documented below in the visit note.  Take 1 1/2 tablets today (12/11) and then change dosage and take 1 tablet daily except 1 1/2 tablets on Wednesdays and Saturdays.    Re-check in 4 weeks.

## 2019-07-25 ENCOUNTER — Other Ambulatory Visit: Payer: Self-pay | Admitting: Internal Medicine

## 2019-07-25 DIAGNOSIS — I743 Embolism and thrombosis of arteries of the lower extremities: Secondary | ICD-10-CM

## 2019-08-04 ENCOUNTER — Other Ambulatory Visit: Payer: Self-pay | Admitting: Internal Medicine

## 2019-08-04 DIAGNOSIS — I10 Essential (primary) hypertension: Secondary | ICD-10-CM

## 2019-08-04 DIAGNOSIS — N183 Chronic kidney disease, stage 3 unspecified: Secondary | ICD-10-CM

## 2019-08-04 DIAGNOSIS — E118 Type 2 diabetes mellitus with unspecified complications: Secondary | ICD-10-CM

## 2019-08-12 ENCOUNTER — Ambulatory Visit (INDEPENDENT_AMBULATORY_CARE_PROVIDER_SITE_OTHER): Payer: Medicare Other | Admitting: Internal Medicine

## 2019-08-12 ENCOUNTER — Encounter: Payer: Self-pay | Admitting: Internal Medicine

## 2019-08-12 ENCOUNTER — Telehealth: Payer: Self-pay

## 2019-08-12 ENCOUNTER — Emergency Department (HOSPITAL_COMMUNITY): Payer: Medicare Other

## 2019-08-12 ENCOUNTER — Ambulatory Visit (INDEPENDENT_AMBULATORY_CARE_PROVIDER_SITE_OTHER): Payer: Medicare Other

## 2019-08-12 ENCOUNTER — Encounter (HOSPITAL_COMMUNITY): Payer: Self-pay

## 2019-08-12 ENCOUNTER — Other Ambulatory Visit: Payer: Self-pay

## 2019-08-12 VITALS — BP 220/120 | HR 73 | Temp 97.9°F | Resp 16 | Ht 69.0 in

## 2019-08-12 DIAGNOSIS — F1210 Cannabis abuse, uncomplicated: Secondary | ICD-10-CM | POA: Insufficient documentation

## 2019-08-12 DIAGNOSIS — I10 Essential (primary) hypertension: Secondary | ICD-10-CM

## 2019-08-12 DIAGNOSIS — M25461 Effusion, right knee: Secondary | ICD-10-CM | POA: Insufficient documentation

## 2019-08-12 DIAGNOSIS — Z87891 Personal history of nicotine dependence: Secondary | ICD-10-CM | POA: Diagnosis not present

## 2019-08-12 DIAGNOSIS — E781 Pure hyperglyceridemia: Secondary | ICD-10-CM | POA: Diagnosis not present

## 2019-08-12 DIAGNOSIS — S8991XA Unspecified injury of right lower leg, initial encounter: Secondary | ICD-10-CM

## 2019-08-12 DIAGNOSIS — E1122 Type 2 diabetes mellitus with diabetic chronic kidney disease: Secondary | ICD-10-CM | POA: Insufficient documentation

## 2019-08-12 DIAGNOSIS — N183 Chronic kidney disease, stage 3 unspecified: Secondary | ICD-10-CM | POA: Insufficient documentation

## 2019-08-12 DIAGNOSIS — I160 Hypertensive urgency: Secondary | ICD-10-CM | POA: Insufficient documentation

## 2019-08-12 DIAGNOSIS — D473 Essential (hemorrhagic) thrombocythemia: Secondary | ICD-10-CM | POA: Diagnosis not present

## 2019-08-12 DIAGNOSIS — N1831 Chronic kidney disease, stage 3a: Secondary | ICD-10-CM | POA: Diagnosis not present

## 2019-08-12 DIAGNOSIS — Z79899 Other long term (current) drug therapy: Secondary | ICD-10-CM | POA: Diagnosis not present

## 2019-08-12 DIAGNOSIS — Z7901 Long term (current) use of anticoagulants: Secondary | ICD-10-CM | POA: Diagnosis not present

## 2019-08-12 DIAGNOSIS — I129 Hypertensive chronic kidney disease with stage 1 through stage 4 chronic kidney disease, or unspecified chronic kidney disease: Secondary | ICD-10-CM | POA: Insufficient documentation

## 2019-08-12 DIAGNOSIS — M25561 Pain in right knee: Secondary | ICD-10-CM | POA: Diagnosis not present

## 2019-08-12 DIAGNOSIS — D75839 Thrombocytosis, unspecified: Secondary | ICD-10-CM

## 2019-08-12 MED ORDER — ZOSTER VAC RECOMB ADJUVANTED 50 MCG/0.5ML IM SUSR: 0.5000 mL | Freq: Once | INTRAMUSCULAR | 1 refills | Status: AC

## 2019-08-12 NOTE — Telephone Encounter (Signed)
Pt mother [employer] returned call to [place]. Informed provider recommended go to ED ASAP. Call [place] at [phone] to update if meds called in or further instructions from provider.  Also Cont[place] says Zavion Sabatini sister [phone] can be reached as well since pt father Ryatt Carodine passed away.

## 2019-08-12 NOTE — Progress Notes (Signed)
Subjective:  Patient ID: Rick Edwards, male    DOB: [birth year]  Age: 64 y.o. MRN: [id number]  CC: Hypertension  This visit occurred during the SARS-CoV-2 public health emergency.  Safety protocols were in place, including screening questions prior to the visit, additional usage of staff PPE, and extensive cleaning of exam room while observing appropriate contact time as indicated for disinfecting solutions.    HPI Rick Edwards presents for f/up - He complains that he fell 2 days ago and landed on his right knee.  He now has pain and swelling in the right knee and cannot walk without assistance.  He also complains that his blood pressure is not well controlled and he has had headaches recently.  It sounds like the only antihypertensive he is taking his metoprolol.  Outpatient Medications Prior to Visit  Medication Sig Dispense Refill  . atorvastatin (LIPITOR) 40 MG tablet Take 1 tablet (40 mg total) by mouth daily. 90 tablet 1  . Avanafil (STENDRA) 200 MG TABS Take 1 tablet by mouth daily as needed. 8 tablet 5  . Icosapent Ethyl (VASCEPA) 1 g CAPS Take 2 capsules (2 g total) by mouth 2 (two) times daily. 360 capsule 1  . metoprolol succinate (TOPROL XL) 50 MG 24 hr tablet Take 1 tablet (50 mg total) by mouth daily. Take with or immediately following a meal. 90 tablet 1  . warfarin (COUMADIN) 5 MG tablet TAKE 1 TABLET BY MOUTH ONCE DAILY OR TAKE AS DIRECTED BY ANTICOAGULATION CLINIC 35 tablet 0  . irbesartan (AVAPRO) 150 MG tablet Take 1 tablet by mouth once daily (Patient not taking: Reported on 08/12/2019) 90 tablet 0   No facility-administered medications prior to visit.    ROS Review of Systems  Constitutional: Negative.  Negative for diaphoresis and fatigue.  HENT: Negative.   Respiratory: Negative for cough, chest tightness, shortness of breath and wheezing.   Cardiovascular: Negative for chest pain, palpitations and leg swelling.  Gastrointestinal: Negative for abdominal  pain, diarrhea, nausea and vomiting.  Genitourinary: Negative.  Negative for difficulty urinating.  Musculoskeletal: Positive for arthralgias. Negative for myalgias.  Skin: Negative.  Negative for color change and pallor.  Neurological: Positive for headaches. Negative for dizziness and weakness.  Hematological: Negative for adenopathy. Does not bruise/bleed easily.  Psychiatric/Behavioral: Negative.     Objective:  BP (!) 220/120 (BP Location: Left Arm, Patient Position: Sitting, Cuff Size: Large)   Pulse 73   Temp 97.9 F (36.6 C) (Oral)   Resp 16   Ht 5\' 9"  (1.753 m)   SpO2 97%   BMI 31.01 kg/m   BP Readings from Last 3 Encounters:  08/12/19 (!) 207/108  08/12/19 (!) 220/120  07/07/19 (!) 176/100    Wt Readings from Last 3 Encounters:  08/12/19 231 lb 6.4 oz (105 kg)  07/02/19 210 lb (95.3 kg)  05/05/19 214 lb (97.1 kg)    Physical Exam Constitutional:      Appearance: Normal appearance.     Comments: He is in a wheelchair today  HENT:     Nose: Nose normal.     Mouth/Throat:     Mouth: Mucous membranes are moist.  Eyes:     General: No scleral icterus.    Conjunctiva/sclera: Conjunctivae normal.  Cardiovascular:     Rate and Rhythm: Regular rhythm.     Pulses: Normal pulses.     Heart sounds: No murmur.  Pulmonary:     Effort: Pulmonary effort is normal.  Breath sounds: No stridor. No wheezing, rhonchi or rales.  Abdominal:     Palpations: There is no mass.     Tenderness: There is no guarding.     Hernia: No hernia is present.  Musculoskeletal:        General: Swelling and tenderness present.     Cervical back: Normal range of motion and neck supple.     Right knee: Swelling, effusion and bony tenderness present. No crepitus. Decreased range of motion. Tenderness present.     Left knee: Normal.     Right lower leg: No edema.     Left lower leg: No edema.  Skin:    General: Skin is warm and dry.  Neurological:     General: No focal deficit  present.     Mental Status: He is alert and oriented to person, place, and time. Mental status is at baseline.  Psychiatric:        Mood and Affect: Mood normal.        Behavior: Behavior normal.     Lab Results  Component Value Date   WBC 6.5 05/05/2019   HGB 15.6 05/05/2019   HCT 45.8 05/05/2019   PLT 408.0 (H) 05/05/2019   GLUCOSE 104 (H) 07/07/2019   CHOL 173 05/05/2019   TRIG 250.0 (H) 05/05/2019   HDL 38.90 (L) 05/05/2019   LDLDIRECT 97.0 05/05/2019   LDLCALC 115 (H) 08/15/2018   ALT 32 07/07/2019   AST 19 07/07/2019   NA 139 07/07/2019   K 3.3 (L) 07/07/2019   CL 96 (L) 07/07/2019   CREATININE 0.98 07/07/2019   BUN 11 07/07/2019   CO2 33 (H) 07/07/2019   TSH 2.05 05/05/2019   PSA 5.54 (H) 05/05/2019   INR 1.9 (A) 07/18/2019   HGBA1C 6.0 05/05/2019   MICROALBUR 2.2 (H) 05/05/2019    DG Knee Complete 4 Views Right  Result Date: 08/12/2019 CLINICAL DATA:  Fall 2 days ago with right knee pain EXAM: RIGHT KNEE - COMPLETE 4+ VIEW COMPARISON:  07/02/2019 FINDINGS: Joint effusion without evident fat component or underlying fracture. The lateral view is limited by obliquity. Advanced osteoarthritis with medial compartment bone-on-bone contact and mild varus deformity. Postoperative right calf. IMPRESSION: 1. Joint effusion without acute fracture. 2. Advanced osteoarthritis at the medial compartment. Electronically Signed   By: Monte Fantasia M.D.   On: 08/12/2019 10:19    Assessment & Plan:   Rick Edwards was seen today for hypertension.  Diagnoses and all orders for this visit:  Essential hypertension, malignant- Labs canceled since he did not return after the x-ray. -     Cancel: Basic metabolic panel -     Cancel: Aldosterone + renin activity w/ ratio -     Cancel: Cortisol; Future  Stage 3a chronic kidney disease -     Cancel: Basic metabolic panel  Thrombocytosis (HCC) -     Cancel: CBC with Differential  Pure hypertriglyceridemia  Right knee injury, initial  encounter- I sent him to x-ray and asked him to return to review the result.  The x-ray result shows an effusion but no fracture.  He did not return after the x-ray was completed. -     DG Knee Complete 4 Views Right; Future  Hypertensive urgency, malignant- He has a systolic blood pressure of 220 and a headache with a history of renal insufficiency and malignant hypertension.  I have asked him to go to the emergency department for urgent evaluation and treatment options.  Other orders -  Zoster Vaccine Adjuvanted [facility]) injection; Inject 0.5 mLs into the muscle once for 1 dose.   I am having Rick Meigs. Hashimi "Reggie" start on Zoster Vaccine Adjuvanted. I am also having him maintain his atorvastatin, Stendra, Vascepa, metoprolol succinate, warfarin, and irbesartan.  Meds ordered this encounter  Medications  . Zoster Vaccine Adjuvanted [facility]) injection    Sig: Inject 0.5 mLs into the muscle once for 1 dose.    Dispense:  0.5 mL    Refill:  1     Follow-up: Return if symptoms worsen or fail to improve.  Scarlette Calico, MD

## 2019-08-12 NOTE — ED Triage Notes (Signed)
Pt reports hypertension x1 month and has been going to PCP. Pt reports PCP put him on medication and followed up today and BP was still high. Pt denies dizziness, headache and any neuro symptoms. Pt also reports right knee pain due to slipping on mat. Pt ambulatory with cane.

## 2019-08-12 NOTE — Telephone Encounter (Signed)
Per PCP - pt xray is normal.   PCP wants pt to report to ED ASAP due to htn urgency.   I called both numbers listed for pt and lvm. I call emergency contact for pt [facility]) and pt father Joyce Ellerbe).  I left vm with both as well.

## 2019-08-12 NOTE — Patient Instructions (Signed)
Knee Effusion Knee effusion refers to excess fluid in the knee joint. This can cause pain and swelling in your knee. Knee effusion creates more pressure than usual in your knee joint. This makes it more difficult for you to bend and move your knee. If there is fluid in your knee, it often means that something is wrong inside your knee. This can be a result of:  Severe arthritis.  Injury to the knee muscles, ligaments, or cartilage.  Infection.  Autoimmune disease. This means that your body's defense system (immune system) mistakenly attacks healthy body tissues. Follow these instructions at home: Medicines  Take over-the-counter and prescription medicines only as told by your health care provider.  Do not drive or use heavy machinery while taking prescription pain medicine.  If you are taking prescription pain medicine, take actions to prevent or treat constipation. Your health care provider may recommend that you: ? Drink enough fluid to keep your urine pale yellow. ? Eat foods that are high in fiber, such as fresh fruits and vegetables, whole grains, and beans. ? Limit foods that are high in fat and processed sugars, such as fried or sweet foods. ? Take an over-the-counter or prescription medicine for constipation. If you have a brace:  Wear the brace as told by your health care provider. Remove it only as told by your health care provider.  Loosen the brace if your toes tingle, become numb, or turn cold and blue.  Keep the brace clean.  If the brace is not waterproof: ? Do not let it get wet. ? Cover it with a watertight covering when you take a bath or a shower. Managing pain, stiffness, and swelling   If directed, put ice on the swollen area: ? If you have a removable brace, remove it as told by your health care provider. ? Put ice in a plastic bag. ? Place a towel between your skin and the bag. ? Leave the ice on for 20 minutes, 2-3 times per day.  Raise (elevate) your  knee at or above the level of your heart while you are sitting or lying down. General instructions  Do not use any products that contain nicotine or tobacco, such as cigarettes and e-cigarettes. These can delay healing. If you need help quitting, ask your health care provider.  Do not use the injured limb to support your body weight until your health care provider says it is okay. Use crutches as told by your health care provider.  Do any rehabilitation or strengthening exercises as told by your health care provider.  Rest as told by your health care provider. ? Avoid sitting for a long time without moving. Get up to take short walks every 1-2 hours. This is important to improve blood flow and breathing. Ask for help if you feel weak or unsteady.  Keep all follow-up visits as told by your health care provider. This is important. Contact a health care provider if you:  Continue to have pain in your knee. Get help right away if you:  Have swelling or redness of your knee that gets worse or does not get better.  Have severe pain in your knee.  Have a fever. Summary  Knee effusion refers to excess fluid in the knee joint. This causes pain and swelling and makes it difficult to bend and move your knee.  Effusion may be caused by severe arthritis, autoimmune disease, infection, or injury to the knee muscles, ligaments, or cartilage.  Take over-the-counter and  prescription medicines only as told by your health care provider.  If you have a brace, wear the brace as told by your health care provider. This information is not intended to replace advice given to you by your health care provider. Make sure you discuss any questions you have with your health care provider. Document Revised: 04/12/2018 Document Reviewed: 08/05/2017 Elsevier Patient Education  2020 Elsevier Inc.  

## 2019-08-13 ENCOUNTER — Emergency Department (HOSPITAL_COMMUNITY)
Admission: EM | Admit: 2019-08-13 | Discharge: 2019-08-13 | Disposition: A | Discharge status: Home / Self Care | Payer: Medicare Other | Attending: Emergency Medicine | Admitting: Emergency Medicine

## 2019-08-13 DIAGNOSIS — I10 Essential (primary) hypertension: Secondary | ICD-10-CM

## 2019-08-13 DIAGNOSIS — M25461 Effusion, right knee: Secondary | ICD-10-CM

## 2019-08-13 MED ORDER — HYDROCODONE-ACETAMINOPHEN 5-325 MG PO TABS: 1.0000 | ORAL_TABLET | Freq: Four times a day (QID) | ORAL | 0 refills | Status: DC | PRN

## 2019-08-13 MED ORDER — ACETAMINOPHEN 325 MG PO TABS
650.0000 mg | ORAL_TABLET | Freq: Once | ORAL | Status: AC
  Administered 2019-08-13: 650 mg via ORAL
  Filled 2019-08-13: qty 2

## 2019-08-13 MED ORDER — IRBESARTAN 150 MG PO TABS: 150.0000 mg | ORAL_TABLET | Freq: Every day | ORAL | 0 refills | Status: DC

## 2019-08-13 NOTE — ED Provider Notes (Signed)
[facility] DEPT Provider Note   CSN: [id number] Arrival date & time: 08/12/19  1313     History Chief Complaint  Patient presents with  . Hypertension  . Knee Pain    Rick Edwards is a 64 y.o. male.  The history is provided by the patient.  Hypertension This is a chronic problem. The problem occurs daily. The problem has been gradually worsening. Pertinent negatives include no chest pain, no abdominal pain, no headaches and no shortness of breath. Nothing aggravates the symptoms. Nothing relieves the symptoms.  Knee Pain Location:  Knee Time since incident:  3 days Knee location:  R knee Pain details:    Quality:  Aching   Severity:  Moderate   Onset quality:  Gradual   Timing:  Constant   Progression:  Worsening Chronicity:  New Relieved by:  Rest Worsened by:  Bearing weight Associated symptoms: no fever    Patient with history of Antithrombin deficiency on Coumadin, chronic kidney disease, Crohn's disease, presents with 2 complaints.  He reports approximately 3 days ago he slipped on a rug and twisted his right knee.  Denies direct trauma to the knee.  Since that time has had increasing pain and swelling to the knee.  No other traumatic injuries.  He also reports recent high blood pressure.  He reports he does not have all his home medications.  He was seen by his primary care doctor and was sent to the ER for further evaluation.  He also underwent a right knee x-ray yesterday    Past Medical History:  Diagnosis Date  . Anemia    anemia thrombocytopenia, saw Hematology 2011, can not r/o myeloproliferative d/c  . Antithrombin III deficiency ([place]) 05/16/2013   On coumadin for this  . Blood clot in vein    in right leg  . Chronic kidney disease 05/27/2013   ACUTE RENAL FAILURE   . Crohn's disease ([place])    tx. Imuran  . GI bleed 6/11   w/ normal EGD 6/11, 3 PRBCs   . Hearing loss    wears hearing aid left ear; birthed with  hearing loss  . HOH (hard of hearing) 05/16/2013  . Hypercalcemia   . Hyperkalemia 05/27/2013  . Hypertension   . Ileostomy in place [facility]) 04-11-13   04-18-13 ileostomy to be taken down.  . Perianal abscess 2001   s/p right hemicolectomy, and drainage of retroperitoneal abcess 2003  . Peripheral vascular disease ([place])   . Transfusion history    last 8'13    Patient Active Problem List   Diagnosis Date Noted  . Right knee injury, initial encounter 08/12/2019  . Hypertensive urgency, malignant 08/12/2019  . Type II diabetes mellitus with manifestations ([place]) 08/17/2018  . Atherosclerosis of aorta ([place]) 09/26/2017  . Thrombocytosis ([place]) 07/19/2016  . Drug-induced erectile dysfunction 07/19/2016  . Pure hypertriglyceridemia 04/05/2016  . Hyperlipidemia with target LDL less than 70 01/18/2016  . Proteinuria 03/13/2014  . OA (osteoarthritis) of knee 11/25/2013  . PVD (peripheral vascular disease) ([place]) 09/24/2013  . Encounter for therapeutic drug monitoring 09/16/2013  . Kidney disease, chronic, stage III (GFR 30-59 ml/min) 05/26/2013  . Antithrombin III deficiency ([place]) 05/16/2013  . Long term (current) use of anticoagulants 11/27/2012  . 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 ([place]) 04/10/2012  . Splenic infarction 03/20/2012  . Routine general medical examination at a health care facility 08/29/2011  . CROHN'S DISEASE-LARGE INTESTINE 03/19/2009  . Essential hypertension, malignant 03/02/2009  Past Surgical History:  Procedure Laterality Date  . Anal fistulotomy  2002  . EMBOLECTOMY  03/12/2012   Procedure: EMBOLECTOMY;  Surgeon: Angelia Mould, MD;  Location[facility] OR;  Service: Vascular;  Laterality: Right;  Right popliteal embolectomy with vein patch angioplasty, right posterior tibial embolectomy with vein patch angioplasty   . HEMICOLECTOMY  08/29/2001   perforated abscess  . ILEOSTOMY CLOSURE N/A 04/18/2013   Procedure: ILEOSTOMY TAKEDOWN;  Surgeon:  Edward Jolly, MD;  Location: [facility] ORS;  Service: General;  Laterality: N/A;  . INTRAOPERATIVE ARTERIOGRAM  03/12/2012   Procedure: INTRA OPERATIVE ARTERIOGRAM;  Surgeon: Angelia Mould, MD;  Location: [place];  Service: Vascular;  Laterality: Right;  . PARTIAL COLECTOMY  03/11/2012   Procedure: PARTIAL COLECTOMY;  Surgeon: Edward Jolly, MD;  Location: [facility] ORS;  Service: General;  Laterality: N/A;  subtotal colectomy transverse and left colon   . TRANSMETATARSAL AMPUTATION  05/10/2012   right       Family History  Problem Relation Age of Onset  . Hypertension Mother   . Hyperlipidemia Mother   . Hypertension Father   . Hyperlipidemia Father   . Heart disease Father   . Stroke Other        GF, aunts   . Hypertension Other        "the whole family"  . Coronary artery disease Neg Hx   . Diabetes Neg Hx   . Colon cancer Neg Hx   . Prostate cancer Neg Hx   . Kidney failure Neg Hx     Social History   Tobacco Use  . Smoking status: Former Smoker    Packs/day: 0.25    Years: 9.00    Pack years: 2.25    Types: Cigarettes    Quit date: 05/09/2011    Years since quitting: 8.2  . Smokeless tobacco: Never Used  Substance Use Topics  . Alcohol use: No    Alcohol/week: 0.0 standard drinks  . Drug use: Yes    Types: Marijuana    Home Medications Prior to Admission medications   Medication Sig Start Date End Date Taking? Authorizing Provider  Avanafil (STENDRA) 200 MG TABS Take 1 tablet by mouth daily as needed. 05/05/19  Yes Janith Lima, MD  carvedilol (COREG) 25 MG tablet Take 25 mg by mouth 2 (two) times daily. 05/30/19  Yes [provider]  Icosapent Ethyl (VASCEPA) 1 g CAPS Take 2 capsules (2 g total) by mouth 2 (two) times daily. 05/06/19  Yes Janith Lima, MD  metoprolol succinate (TOPROL XL) 50 MG 24 hr tablet Take 1 tablet (50 mg total) by mouth daily. Take with or immediately following a meal. 07/07/19  Yes Robyn Haber, MD  warfarin  (COUMADIN) 5 MG tablet TAKE 1 TABLET BY MOUTH ONCE DAILY OR TAKE AS DIRECTED BY ANTICOAGULATION CLINIC Patient taking differently: Take 5 mg by mouth one time only at 6 PM.  07/25/19  Yes Janith Lima, MD  atorvastatin (LIPITOR) 40 MG tablet Take 1 tablet (40 mg total) by mouth daily. Patient not taking: Reported on 08/12/2019 01/28/19   Janith Lima, MD  irbesartan (AVAPRO) 150 MG tablet Take 1 tablet by mouth once daily Patient not taking: Reported on 08/12/2019 08/06/19   Janith Lima, MD  fenofibrate (TRICOR) 145 MG tablet Take 1 tablet by mouth once daily 01/27/19 07/07/19  Janith Lima, MD  indapamide (LOZOL) 1.25 MG tablet Take 1 tablet (1.25 mg total) by mouth daily.  05/05/19 07/07/19  Janith Lima, MD    Allergies    Mesalamine  Review of Systems   Review of Systems  Constitutional: Negative for fever.  Respiratory: Negative for shortness of breath.   Cardiovascular: Negative for chest pain.  Gastrointestinal: Negative for abdominal pain.  Musculoskeletal: Positive for arthralgias and joint swelling.  Neurological: Negative for weakness and headaches.  All other systems reviewed and are negative.   Physical Exam Updated Vital Signs BP (!) 193/110 (BP Location: Left Arm)   Pulse 82   Temp 99.2 F (37.3 C) (Oral)   Resp 16   Ht 1.753 m (5\' 9" )   Wt 105 kg   SpO2 98%   BMI 34.17 kg/m   Physical Exam CONSTITUTIONAL: Well developed/well nourished HEAD: Normocephalic/atraumatic EYES: EOMI ENMT: Mask in place NECK: supple no meningeal signs CV: S1/S2 noted, no murmurs/rubs/gallops noted LUNGS: Lungs are clear to auscultation bilaterally, no apparent distress ABDOMEN: soft, nontender NEURO: Pt is awake/alert/appropriate, moves all extremitiesx4.  No facial droop.  No arm or leg drift.  Normal mental status EXTREMITIES: pulses normal/equal in both feet, tenderness noted to right knee with effusion noted.  No erythema.  No bruising  no signs of trauma.  Right  calf is nontender.  Difficulty with range of motion of right knee due to pain and swelling SKIN: warm, color normal PSYCH: no abnormalities of mood noted, alert and oriented to situation  ED Results / Procedures / Treatments   Labs (all labs ordered are listed, but only abnormal results are displayed) Labs Reviewed - No data to display  EKG None  Radiology DG Knee Complete 4 Views Right  Result Date: 08/12/2019 CLINICAL DATA:  Fall 2 days ago with right knee pain EXAM: RIGHT KNEE - COMPLETE 4+ VIEW COMPARISON:  07/02/2019 FINDINGS: Joint effusion without evident fat component or underlying fracture. The lateral view is limited by obliquity. Advanced osteoarthritis with medial compartment bone-on-bone contact and mild varus deformity. Postoperative right calf. IMPRESSION: 1. Joint effusion without acute fracture. 2. Advanced osteoarthritis at the medial compartment. Electronically Signed   By: Monte Fantasia M.D.   On: 08/12/2019 10:19    Procedures Procedures   Medications Ordered in ED Medications  acetaminophen (TYLENOL) tablet 650 mg (650 mg Oral Given 08/13/19 0331)    ED Course  I have reviewed the triage vital signs and the nursing notes.  Pertinent  imaging results that were available during my care of the patient were reviewed by me and considered in my medical decision making (see chart for details).    MDM Rules/Calculators/A&P                     3:49 AM Pt already had x-ray done on January 5 that shows an effusion but no acute fracture Likely has reactive effusion from twisting his knee.  May also have some component of hemarthrosis because he is on Coumadin Will recommend rest, elevation, pain control.  Will defer any arthrocentesis for now  Apparently his PCP sent him here due to elevated blood pressure.  Patient currently denies any headache, weak no weakness or numbness.  No chest pain or shortness of breath.  We will ensure the patient can take his home  medications Final Clinical Impression(s) / ED Diagnoses Final diagnoses:  Essential hypertension  Effusion of right knee    Rx / DC Orders ED Discharge Orders    None       Ripley Fraise, MD 08/13/19 [id number]

## 2019-08-13 NOTE — Discharge Instructions (Signed)
Follow-up with your orthopedist as we discussed Be Sure to call your primary doctor about refilling your other blood pressure medicine

## 2019-08-15 ENCOUNTER — Ambulatory Visit: Payer: Medicare Other

## 2019-08-21 ENCOUNTER — Other Ambulatory Visit: Payer: Self-pay | Admitting: Internal Medicine

## 2019-08-21 DIAGNOSIS — I743 Embolism and thrombosis of arteries of the lower extremities: Secondary | ICD-10-CM

## 2019-08-22 ENCOUNTER — Ambulatory Visit (INDEPENDENT_AMBULATORY_CARE_PROVIDER_SITE_OTHER): Payer: Medicare Other | Admitting: General Practice

## 2019-08-22 ENCOUNTER — Other Ambulatory Visit: Payer: Self-pay

## 2019-08-22 ENCOUNTER — Other Ambulatory Visit: Payer: Self-pay | Admitting: General Practice

## 2019-08-22 DIAGNOSIS — I743 Embolism and thrombosis of arteries of the lower extremities: Secondary | ICD-10-CM | POA: Diagnosis not present

## 2019-08-22 DIAGNOSIS — Z7901 Long term (current) use of anticoagulants: Secondary | ICD-10-CM

## 2019-08-22 LAB — POCT INR: INR: 1.8 — AB (ref 2.0–3.0)

## 2019-08-22 MED ORDER — WARFARIN SODIUM 5 MG PO TABS: ORAL_TABLET | ORAL | 1 refills | Status: DC

## 2019-08-22 NOTE — Progress Notes (Signed)
Medical screening examination/treatment/procedure(s) were performed by non-physician practitioner and as supervising physician I was immediately available for consultation/collaboration. I agree with above. James John, MD   

## 2019-08-22 NOTE — Patient Instructions (Addendum)
.  lbpcmh  Take 1 1/2 tablets today (1/15) and then change dosage and take 1 tablet daily except 1 1/2 tablets on Mon Wednesdays and Saturdays.    Re-check in 3 weeks.

## 2019-09-12 ENCOUNTER — Ambulatory Visit (INDEPENDENT_AMBULATORY_CARE_PROVIDER_SITE_OTHER): Payer: Medicare Other | Admitting: General Practice

## 2019-09-12 ENCOUNTER — Other Ambulatory Visit: Payer: Self-pay

## 2019-09-12 DIAGNOSIS — I743 Embolism and thrombosis of arteries of the lower extremities: Secondary | ICD-10-CM | POA: Diagnosis not present

## 2019-09-12 DIAGNOSIS — Z7901 Long term (current) use of anticoagulants: Secondary | ICD-10-CM

## 2019-09-12 LAB — POCT INR: INR: 1.7 — AB (ref 2.0–3.0)

## 2019-09-12 NOTE — Progress Notes (Signed)
Medical screening examination/treatment/procedure(s) were performed by non-physician practitioner and as supervising physician I was immediately available for consultation/collaboration. I agree with above. Izaias Krupka, MD   

## 2019-09-12 NOTE — Patient Instructions (Addendum)
Pre visit review using our clinic review tool, if applicable. No additional management support is needed unless otherwise documented below in the visit note.  Change dosage and take 1 1/2 tablets  daily except 1 tablet on Sun Tues and Thursdays.  Re-check back in 3 weeks.

## 2019-09-22 ENCOUNTER — Telehealth: Payer: Self-pay | Admitting: Internal Medicine

## 2019-09-22 ENCOUNTER — Other Ambulatory Visit: Payer: Self-pay | Admitting: Internal Medicine

## 2019-09-22 DIAGNOSIS — E785 Hyperlipidemia, unspecified: Secondary | ICD-10-CM

## 2019-09-22 MED ORDER — ATORVASTATIN CALCIUM 40 MG PO TABS: 40.0000 mg | ORAL_TABLET | Freq: Every day | ORAL | 1 refills | Status: DC

## 2019-09-22 NOTE — Telephone Encounter (Signed)
° ° ° °  Medication Requested:   Is medication on med list: atorvastatin (LIPITOR) 40 MG tablet (if no, inform pt they may need an appointment)    Pharmacy (Name, Street, [place]):[facility], [place]

## 2019-10-03 ENCOUNTER — Other Ambulatory Visit: Payer: Self-pay

## 2019-10-03 ENCOUNTER — Ambulatory Visit (INDEPENDENT_AMBULATORY_CARE_PROVIDER_SITE_OTHER): Payer: Medicare Other | Admitting: General Practice

## 2019-10-03 DIAGNOSIS — Z7901 Long term (current) use of anticoagulants: Secondary | ICD-10-CM

## 2019-10-03 DIAGNOSIS — I743 Embolism and thrombosis of arteries of the lower extremities: Secondary | ICD-10-CM

## 2019-10-03 LAB — POCT INR: INR: 2.1 (ref 2.0–3.0)

## 2019-10-03 NOTE — Progress Notes (Signed)
Medical screening examination/treatment/procedure(s) were performed by non-physician practitioner and as supervising physician I was immediately available for consultation/collaboration. I agree with above. Marasia Newhall, MD   

## 2019-10-03 NOTE — Patient Instructions (Addendum)
Pre visit review using our clinic review tool, if applicable. No additional management support is needed unless otherwise documented below in the visit note.  Continue to take 1 1/2 tablets  daily except 1 tablet on Sun Tues and Thursdays.  Re-check back in 4 weeks.

## 2019-10-23 ENCOUNTER — Telehealth: Payer: Self-pay

## 2019-10-23 NOTE — Telephone Encounter (Signed)
New message    The patient's mother calling wants Dr. Ronnald Ramp to advise on COVID Vaccine.

## 2019-10-24 NOTE — Telephone Encounter (Signed)
New message:   Pt is calling and wanted to follow up about being okay to take the covid vaccination.

## 2019-10-24 NOTE — Telephone Encounter (Signed)
LVM for pt informing that is was recommended that he receive the COVID Vaccine as the first available slot to get it.

## 2019-10-30 ENCOUNTER — Ambulatory Visit: Payer: Medicare Other

## 2019-10-30 ENCOUNTER — Other Ambulatory Visit: Payer: Self-pay

## 2019-10-30 ENCOUNTER — Ambulatory Visit (INDEPENDENT_AMBULATORY_CARE_PROVIDER_SITE_OTHER): Payer: Medicare Other | Admitting: General Practice

## 2019-10-30 DIAGNOSIS — Z7901 Long term (current) use of anticoagulants: Secondary | ICD-10-CM

## 2019-10-30 DIAGNOSIS — I743 Embolism and thrombosis of arteries of the lower extremities: Secondary | ICD-10-CM

## 2019-10-30 LAB — POCT INR: INR: 1.5 — AB (ref 2.0–3.0)

## 2019-10-30 NOTE — Progress Notes (Signed)
I have reviewed and agree.

## 2019-10-30 NOTE — Patient Instructions (Addendum)
Pre visit review using our clinic review tool, if applicable. No additional management support is needed unless otherwise documented below in the visit note.  Take 1 1/2 tablets today (3/25) and take 2 tablets tomorrow (3/26) and then continue to take 1 1/2 tablets  daily except 1 tablet on Sun Tues and Thursdays.  Re-check back in 4 weeks.

## 2019-11-15 ENCOUNTER — Telehealth: Payer: Self-pay

## 2019-11-15 NOTE — Telephone Encounter (Signed)
Pt is due for an appt and for PSA labs.   Can you call him (or his mother) to schedule that appt?

## 2019-11-24 ENCOUNTER — Other Ambulatory Visit: Payer: Self-pay | Admitting: Internal Medicine

## 2019-11-24 ENCOUNTER — Telehealth: Payer: Self-pay

## 2019-11-24 DIAGNOSIS — E118 Type 2 diabetes mellitus with unspecified complications: Secondary | ICD-10-CM

## 2019-11-24 DIAGNOSIS — R972 Elevated prostate specific antigen [PSA]: Secondary | ICD-10-CM | POA: Insufficient documentation

## 2019-11-24 DIAGNOSIS — I10 Essential (primary) hypertension: Secondary | ICD-10-CM

## 2019-11-24 NOTE — Telephone Encounter (Signed)
Can you enter labs for pt? We were able to get a hold of him.

## 2019-11-24 NOTE — Telephone Encounter (Signed)
LVM for patient to return call to schedule an OV and labs.

## 2019-11-27 ENCOUNTER — Other Ambulatory Visit: Payer: Medicare Other

## 2019-11-27 ENCOUNTER — Other Ambulatory Visit: Payer: Self-pay

## 2019-11-27 ENCOUNTER — Ambulatory Visit (INDEPENDENT_AMBULATORY_CARE_PROVIDER_SITE_OTHER): Payer: Medicare Other | Admitting: Internal Medicine

## 2019-11-27 ENCOUNTER — Encounter: Payer: Self-pay | Admitting: Internal Medicine

## 2019-11-27 VITALS — BP 220/110 | HR 85 | Temp 98.6°F | Resp 16 | Ht 69.0 in | Wt 244.0 lb

## 2019-11-27 DIAGNOSIS — Z7901 Long term (current) use of anticoagulants: Secondary | ICD-10-CM | POA: Diagnosis not present

## 2019-11-27 DIAGNOSIS — E781 Pure hyperglyceridemia: Secondary | ICD-10-CM

## 2019-11-27 DIAGNOSIS — D75839 Thrombocytosis, unspecified: Secondary | ICD-10-CM

## 2019-11-27 DIAGNOSIS — E785 Hyperlipidemia, unspecified: Secondary | ICD-10-CM

## 2019-11-27 DIAGNOSIS — D473 Essential (hemorrhagic) thrombocythemia: Secondary | ICD-10-CM

## 2019-11-27 DIAGNOSIS — R972 Elevated prostate specific antigen [PSA]: Secondary | ICD-10-CM | POA: Diagnosis not present

## 2019-11-27 DIAGNOSIS — I743 Embolism and thrombosis of arteries of the lower extremities: Secondary | ICD-10-CM | POA: Diagnosis not present

## 2019-11-27 DIAGNOSIS — E118 Type 2 diabetes mellitus with unspecified complications: Secondary | ICD-10-CM

## 2019-11-27 DIAGNOSIS — I10 Essential (primary) hypertension: Secondary | ICD-10-CM

## 2019-11-27 DIAGNOSIS — R808 Other proteinuria: Secondary | ICD-10-CM

## 2019-11-27 DIAGNOSIS — N1831 Chronic kidney disease, stage 3a: Secondary | ICD-10-CM | POA: Diagnosis not present

## 2019-11-27 LAB — IBC PANEL
Iron: 66 ug/dL (ref 42–165)
Saturation Ratios: 20.2 % (ref 20.0–50.0)
Transferrin: 233 mg/dL (ref 212.0–360.0)

## 2019-11-27 LAB — CBC WITH DIFFERENTIAL/PLATELET
Basophils Absolute: 0.1 10*3/uL (ref 0.0–0.1)
Basophils Relative: 1.2 % (ref 0.0–3.0)
Eosinophils Absolute: 0.1 10*3/uL (ref 0.0–0.7)
Eosinophils Relative: 1.9 % (ref 0.0–5.0)
HCT: 44.9 % (ref 39.0–52.0)
Hemoglobin: 15.3 g/dL (ref 13.0–17.0)
Lymphocytes Relative: 35.5 % (ref 12.0–46.0)
Lymphs Abs: 2.1 10*3/uL (ref 0.7–4.0)
MCHC: 34.2 g/dL (ref 30.0–36.0)
MCV: 87.3 fl (ref 78.0–100.0)
Monocytes Absolute: 0.5 10*3/uL (ref 0.1–1.0)
Monocytes Relative: 8.9 % (ref 3.0–12.0)
Neutro Abs: 3.1 10*3/uL (ref 1.4–7.7)
Neutrophils Relative %: 52.5 % (ref 43.0–77.0)
Platelets: 338 10*3/uL (ref 150.0–400.0)
RBC: 5.14 Mil/uL (ref 4.22–5.81)
RDW: 14.5 % (ref 11.5–15.5)
WBC: 6 10*3/uL (ref 4.0–10.5)

## 2019-11-27 LAB — URINALYSIS, ROUTINE W REFLEX MICROSCOPIC
Bilirubin Urine: NEGATIVE
Ketones, ur: NEGATIVE
Leukocytes,Ua: NEGATIVE
Nitrite: NEGATIVE
Specific Gravity, Urine: >=1.03 — AB (ref 1.000–1.030)
Total Protein, Urine: >=300 — AB
Urine Glucose: NEGATIVE
Urobilinogen, UA: 0.2 (ref 0.0–1.0)
WBC, UA: NONE SEEN (ref 0–?)
pH: 6 (ref 5.0–8.0)

## 2019-11-27 LAB — BASIC METABOLIC PANEL
BUN: 10 mg/dL (ref 6–23)
CO2: 30 mEq/L (ref 19–32)
Calcium: 8.9 mg/dL (ref 8.4–10.5)
Chloride: 100 mEq/L (ref 96–112)
Creatinine, Ser: 0.88 mg/dL (ref 0.40–1.50)
GFR: 106.37 mL/min (ref >60.00)
Glucose, Bld: 101 mg/dL — ABNORMAL HIGH (ref 70–99)
Potassium: 3.8 mEq/L (ref 3.5–5.1)
Sodium: 138 mEq/L (ref 135–145)

## 2019-11-27 LAB — LIPID PANEL
Cholesterol: 155 mg/dL (ref 0–200)
HDL: 36.9 mg/dL — ABNORMAL LOW (ref >39.00)
LDL Cholesterol: 91 mg/dL (ref 0–99)
NonHDL: 117.85
Total CHOL/HDL Ratio: 4
Triglycerides: 132 mg/dL (ref 0.0–149.0)
VLDL: 26.4 mg/dL (ref 0.0–40.0)

## 2019-11-27 LAB — PSA: PSA: 1.5

## 2019-11-27 LAB — FERRITIN: Ferritin: 244.8 ng/mL (ref 22.0–322.0)

## 2019-11-27 LAB — HEMOGLOBIN A1C: Hgb A1c MFr Bld: 5.8 % (ref 4.6–6.5)

## 2019-11-27 LAB — POCT INR: INR: 2.1 (ref 2.0–3.0)

## 2019-11-27 LAB — CORTISOL: Cortisol, Plasma: 7.5 ug/dL

## 2019-11-27 MED ORDER — CARVEDILOL 25 MG PO TABS: 25.0000 mg | ORAL_TABLET | Freq: Two times a day (BID) | ORAL | 0 refills | Status: DC

## 2019-11-27 MED ORDER — ICOSAPENT ETHYL 1 G PO CAPS: 2.0000 g | ORAL_CAPSULE | Freq: Two times a day (BID) | ORAL | 1 refills | Status: DC

## 2019-11-27 NOTE — Progress Notes (Addendum)
Subjective:  Patient ID: Rick Edwards, male    DOB: 06-08-1956  Age: 64 y.o. MRN: [id number]  CC: Hypertension  This visit occurred during the SARS-CoV-2 public health emergency.  Safety protocols were in place, including screening questions prior to the visit, additional usage of staff PPE, and extensive cleaning of exam room while observing appropriate contact time as indicated for disinfecting solutions.    HPI Rick Edwards presents for f/up - He does not know if he is taking both of his antihypertensives.  He thinks he may be taking the irbesartan but not carvedilol.  He does not monitor his blood pressure.  He denies any recent episodes of headache, blurred vision, chest pain, shortness of breath, palpitations, edema, or fatigue.  Outpatient Medications Prior to Visit  Medication Sig Dispense Refill  . atorvastatin (LIPITOR) 40 MG tablet Take 1 tablet (40 mg total) by mouth daily. 90 tablet 1  . warfarin (COUMADIN) 5 MG tablet Take 1 tablet daily except take 1 1/2 tablets on Mon Wed and Sat or TAKE AS DIRECTED BY ANTICOAGULATION CLINIC (Patient not taking: Reported on 11/27/2019) 120 tablet 1  . Avanafil (STENDRA) 200 MG TABS Take 1 tablet by mouth daily as needed. (Patient not taking: Reported on 11/27/2019) 8 tablet 5  . carvedilol (COREG) 25 MG tablet Take 25 mg by mouth 2 (two) times daily.    Vanessa Kick Ethyl (VASCEPA) 1 g CAPS Take 2 capsules (2 g total) by mouth 2 (two) times daily. (Patient not taking: Reported on 11/27/2019) 360 capsule 1  . irbesartan (AVAPRO) 150 MG tablet Take 1 tablet (150 mg total) by mouth daily. (Patient not taking: Reported on 11/27/2019) 14 tablet 0  . metoprolol succinate (TOPROL XL) 50 MG 24 hr tablet Take 1 tablet (50 mg total) by mouth daily. Take with or immediately following a meal. (Patient not taking: Reported on 11/27/2019) 90 tablet 1   No facility-administered medications prior to visit.    ROS Review of Systems  Objective:  BP  (!) 220/110 (BP Location: Left Arm, Patient Position: Sitting, Cuff Size: Large)   Pulse 85   Temp 98.6 F (37 C) (Oral)   Resp 16   Ht 5\' 9"  (1.753 m)   Wt 244 lb (110.7 kg)   SpO2 95%   BMI 36.03 kg/m   BP Readings from Last 3 Encounters:  11/27/19 (!) 220/110  08/13/19 (!) 193/110  08/12/19 (!) 220/120    Wt Readings from Last 3 Encounters:  11/27/19 244 lb (110.7 kg)  08/12/19 231 lb 6.4 oz (105 kg)  07/02/19 210 lb (95.3 kg)    Physical Exam Cardiovascular:     Heart sounds: Normal heart sounds, S1 normal and S2 normal. No murmur. No gallop.      Comments: EKG- NSR, 79 bpm No LVH NO Q waves LAD and TWI in lateral leads - unchanged from th prior EKG Musculoskeletal:     Right lower leg: No edema.     Left lower leg: No edema.     Lab Results  Component Value Date   WBC 6.0 11/27/2019   HGB 15.3 11/27/2019   HCT 44.9 11/27/2019   PLT 338.0 11/27/2019   GLUCOSE 101 (H) 11/27/2019   CHOL 155 11/27/2019   TRIG 132.0 11/27/2019   HDL 36.90 (L) 11/27/2019   LDLDIRECT 97.0 05/05/2019   LDLCALC 91 11/27/2019   ALT 32 07/07/2019   AST 19 07/07/2019   NA 138 11/27/2019   K 3.8 11/27/2019  CL 100 11/27/2019   CREATININE 0.88 11/27/2019   BUN 10 11/27/2019   CO2 30 11/27/2019   TSH 2.05 05/05/2019   PSA 1.5 11/27/2019   INR 2.1 11/27/2019   HGBA1C 5.8 11/27/2019   MICROALBUR 2.2 (H) 05/05/2019    No results found.  Assessment & Plan:   Rick Edwards was seen today for hypertension.  Diagnoses and all orders for this visit:  Type II diabetes mellitus with manifestations ([place])- His blood sugars are very well controlled.  Medical therapy is not indicated. -     Basic metabolic panel; Future -     Hemoglobin A1c; Future -     Hemoglobin A1c -     Basic metabolic panel  Essential hypertension, malignant- His blood pressure is not well controlled.  I do not know that he has been compliant with his antihypertensives.  He has a tendency towards hypokalemia.   I ordered labs to screen for secondary causes.  I recommended that he restart carvedilol if he is not been taking it and will add spironolactone.   -     EKG 12-Lead -     carvedilol (COREG) 25 MG tablet; Take 1 tablet (25 mg total) by mouth 2 (two) times daily with a meal. -     Aldosterone + renin activity w/ ratio; Future -     Cortisol; Future -     Cortisol -     Aldosterone + renin activity w/ ratio -     spironolactone (ALDACTONE) 25 MG tablet; Take 1 tablet (25 mg total) by mouth daily.  Stage 3a chronic kidney disease- His GFR is normal but he has developed a significant degree of proteinuria.  As of asked him to see nephrology to have the proteinuria evaluated and treated. -     Ambulatory referral to Nephrology  Thrombocytosis ([place]) - His PLT ct is normal now. -     IBC panel; Future -     CBC with Differential/Platelet; Future -     Ferritin; Future -     Ferritin -     CBC with Differential/Platelet -     IBC panel  Elevated PSA- Will recheck his PSA and if it remains elevated or is rising then I will refer to urology. -     PSA, total and free; Future -     PSA, total and free  Hyperlipidemia with target LDL less than 70 - He has achieved his LDL goal and is doing well on the statin. -     Lipid panel; Future -     Lipid panel  Pure hypertriglyceridemia -     Lipid panel; Future -     icosapent Ethyl (VASCEPA) 1 g capsule; Take 2 capsules (2 g total) by mouth 2 (two) times daily. -     Lipid panel  Other proteinuria -     Urinalysis, Routine w reflex microscopic; Future -     Urinalysis, Routine w reflex microscopic -     Ambulatory referral to Nephrology   I have discontinued Rick Edwards "Reggie"'s [place] and metoprolol succinate. I have changed his Vascepa to icosapent Ethyl. I have also changed his carvedilol. Additionally, I am having him start on spironolactone. Lastly, I am having him maintain his warfarin and atorvastatin.  Meds ordered this  encounter  Medications  . icosapent Ethyl (VASCEPA) 1 g capsule    Sig: Take 2 capsules (2 g total) by mouth 2 (two) times daily.  Dispense:  360 capsule    Refill:  1  . carvedilol (COREG) 25 MG tablet    Sig: Take 1 tablet (25 mg total) by mouth 2 (two) times daily with a meal.    Dispense:  180 tablet    Refill:  0  . spironolactone (ALDACTONE) 25 MG tablet    Sig: Take 1 tablet (25 mg total) by mouth daily.    Dispense:  90 tablet    Refill:  1   I spent 60 minutes in preparing to see the patient by review of recent labs, imaging and procedures, obtaining and reviewing separately obtained history, communicating with the patient and family or caregiver, ordering medications, tests or procedures, and documenting clinical information in the EHR including the differential Dx, treatment, and any further evaluation and other management of 1. Type II diabetes mellitus with manifestations ([place]) 2. Essential hypertension, malignant 3. Stage 3a chronic kidney disease 4. Thrombocytosis ([facility]) 5. Elevated PSA 6. Hyperlipidemia with target LDL less than 70 7. Pure hypertriglyceridemia 8. Other proteinuria     Follow-up: Return in about 4 weeks (around 12/25/2019).  Scarlette Calico, MD

## 2019-11-27 NOTE — Progress Notes (Signed)
I have reviewed and agree.

## 2019-11-27 NOTE — Patient Instructions (Signed)

## 2019-11-27 NOTE — Patient Instructions (Signed)
Pre visit review using our clinic review tool, if applicable. No additional management support is needed unless otherwise documented below in the visit note.  Continue to take 1 1/2 tablets  daily except 1 tablet on Sun Tues and Thursdays.  Re-check back in 4 weeks.

## 2019-11-28 ENCOUNTER — Telehealth: Payer: Self-pay | Admitting: Internal Medicine

## 2019-11-28 MED ORDER — SPIRONOLACTONE 25 MG PO TABS: 25.0000 mg | ORAL_TABLET | Freq: Every day | ORAL | 1 refills | Status: DC

## 2019-11-28 MED ORDER — IRBESARTAN 150 MG PO TABS: 150.0000 mg | ORAL_TABLET | Freq: Every day | ORAL | 1 refills | Status: DC

## 2019-11-28 NOTE — Telephone Encounter (Signed)
    Patients sister calling, states patient has no medication remaining   1.Medication Requested: icosapent Ethyl (VASCEPA) 1 g capsule irbesartan (AVAPRO) 150 MG tablet  2. Pharmacy (Name, Street, City):[id number] - [place], [place] [address]  3. On Med List: yes  4. Last Visit with PCP: 11/27/19  5. Next visit date with PCP:   Agent: Please be advised that RX refills may take up to 3 business days. We ask that you follow-up with your pharmacy.

## 2019-11-28 NOTE — Telephone Encounter (Signed)
Error

## 2019-11-28 NOTE — Telephone Encounter (Signed)
erx sent as requested.  

## 2019-12-02 LAB — PSA, TOTAL AND FREE
PSA, % Free: 40 % (calc) (ref >25)
PSA, Free: 0.6 ng/mL
PSA, Total: 1.5 ng/mL (ref <=4.0)

## 2019-12-02 LAB — ALDOSTERONE + RENIN ACTIVITY W/ RATIO
ALDO / PRA Ratio: 3.8 Ratio (ref 0.9–28.9)
Aldosterone: 1 ng/dL
Renin Activity: 0.26 ng/mL/h (ref 0.25–5.82)

## 2019-12-03 ENCOUNTER — Other Ambulatory Visit: Payer: Medicare Other

## 2019-12-03 ENCOUNTER — Other Ambulatory Visit: Payer: Self-pay

## 2019-12-03 DIAGNOSIS — R972 Elevated prostate specific antigen [PSA]: Secondary | ICD-10-CM

## 2019-12-03 DIAGNOSIS — E118 Type 2 diabetes mellitus with unspecified complications: Secondary | ICD-10-CM

## 2019-12-03 DIAGNOSIS — I10 Essential (primary) hypertension: Secondary | ICD-10-CM

## 2019-12-03 LAB — BASIC METABOLIC PANEL
BUN: 11 mg/dL (ref 6–23)
CO2: 30 mEq/L (ref 19–32)
Calcium: 9.1 mg/dL (ref 8.4–10.5)
Chloride: 100 mEq/L (ref 96–112)
Creatinine, Ser: 0.93 mg/dL (ref 0.40–1.50)
GFR: 99.79 mL/min (ref >60.00)
Glucose, Bld: 112 mg/dL — ABNORMAL HIGH (ref 70–99)
Potassium: 3.8 mEq/L (ref 3.5–5.1)
Sodium: 135 mEq/L (ref 135–145)

## 2019-12-04 LAB — PSA, TOTAL AND FREE
PSA, % Free: 40 % (calc) (ref >25)
PSA, Free: 0.6 ng/mL
PSA, Total: 1.5 ng/mL (ref <=4.0)

## 2019-12-04 LAB — HEMOGLOBIN A1C: Hgb A1c MFr Bld: 6 % (ref 4.6–6.5)

## 2019-12-10 ENCOUNTER — Telehealth: Payer: Self-pay | Admitting: Internal Medicine

## 2019-12-10 NOTE — Telephone Encounter (Signed)
LVM for pt to call back as soon as possible.   RE: Lab results

## 2019-12-10 NOTE — Telephone Encounter (Signed)
New message: ° ° °Pt states he is returning a call. Please advise. °

## 2019-12-12 NOTE — Telephone Encounter (Signed)
Called and spoke to pt mother. Gave her the results.

## 2019-12-12 NOTE — Telephone Encounter (Signed)
° ° °  Please return call to patient °

## 2019-12-15 ENCOUNTER — Telehealth: Payer: Self-pay

## 2019-12-15 DIAGNOSIS — I10 Essential (primary) hypertension: Secondary | ICD-10-CM

## 2019-12-15 DIAGNOSIS — E785 Hyperlipidemia, unspecified: Secondary | ICD-10-CM

## 2019-12-15 DIAGNOSIS — E781 Pure hyperglyceridemia: Secondary | ICD-10-CM

## 2019-12-15 NOTE — Telephone Encounter (Signed)
1.Medication Requested:atorvastatin (LIPITOR) 40 MG tablet carvedilol (COREG) 25 MG tablet icosapent Ethyl (VASCEPA) 1 g capsule irbesartan (AVAPRO) 150 MG tablet spironolactone (ALDACTONE) 25 MG tablet warfarin (COUMADIN) 5 MG tablet  2. Pharmacy (Name, Street, City):[id number] - [place], [place] - [address]   3. On Med List: Yes   4. Last Visit with PCP: 4.22.2021  5. Next visit date with PCP: N/a   Agent: Please be advised that RX refills may take up to 3 business days. We ask that you follow-up with your pharmacy.

## 2019-12-16 ENCOUNTER — Other Ambulatory Visit: Payer: Self-pay | Admitting: General Practice

## 2019-12-16 DIAGNOSIS — I743 Embolism and thrombosis of arteries of the lower extremities: Secondary | ICD-10-CM

## 2019-12-16 MED ORDER — WARFARIN SODIUM 5 MG PO TABS: ORAL_TABLET | ORAL | 1 refills | Status: DC

## 2019-12-16 MED ORDER — SPIRONOLACTONE 25 MG PO TABS: 25.0000 mg | ORAL_TABLET | Freq: Every day | ORAL | 1 refills | Status: DC

## 2019-12-16 MED ORDER — ATORVASTATIN CALCIUM 40 MG PO TABS: 40.0000 mg | ORAL_TABLET | Freq: Every day | ORAL | 1 refills | Status: DC

## 2019-12-16 MED ORDER — IRBESARTAN 150 MG PO TABS: 150.0000 mg | ORAL_TABLET | Freq: Every day | ORAL | 1 refills | Status: DC

## 2019-12-16 MED ORDER — ICOSAPENT ETHYL 1 G PO CAPS: 2.0000 g | ORAL_CAPSULE | Freq: Two times a day (BID) | ORAL | 1 refills | Status: DC

## 2019-12-16 MED ORDER — CARVEDILOL 25 MG PO TABS: 25.0000 mg | ORAL_TABLET | Freq: Two times a day (BID) | ORAL | 0 refills | Status: DC

## 2019-12-16 NOTE — Telephone Encounter (Signed)
All rx sent except for warfarin.   Coumadin will need to address.

## 2019-12-16 NOTE — Telephone Encounter (Signed)
Cindy,   Can you address the Warfarin refill request please?

## 2019-12-17 DIAGNOSIS — I10 Essential (primary) hypertension: Secondary | ICD-10-CM | POA: Diagnosis not present

## 2019-12-22 DIAGNOSIS — I10 Essential (primary) hypertension: Secondary | ICD-10-CM | POA: Diagnosis not present

## 2019-12-22 DIAGNOSIS — Z6830 Body mass index (BMI) 30.0-30.9, adult: Secondary | ICD-10-CM | POA: Diagnosis not present

## 2019-12-25 ENCOUNTER — Other Ambulatory Visit: Payer: Self-pay

## 2019-12-25 ENCOUNTER — Ambulatory Visit (INDEPENDENT_AMBULATORY_CARE_PROVIDER_SITE_OTHER): Payer: Medicare Other | Admitting: General Practice

## 2019-12-25 DIAGNOSIS — Z7901 Long term (current) use of anticoagulants: Secondary | ICD-10-CM | POA: Diagnosis not present

## 2019-12-25 DIAGNOSIS — I743 Embolism and thrombosis of arteries of the lower extremities: Secondary | ICD-10-CM

## 2019-12-25 LAB — POCT INR: INR: 1.5 — AB (ref 2.0–3.0)

## 2019-12-25 NOTE — Progress Notes (Signed)
I have reviewed and agree.

## 2019-12-25 NOTE — Patient Instructions (Addendum)
Pre visit review using our clinic review tool, if applicable. No additional management support is needed unless otherwise documented below in the visit note.  Take 1 1/2 tablets today and take 2 tablets tomorrow and then change dosage and take 1 1/2 tablets  daily except 1 tablet on Sun and Thursdays.  Re-check back in 3 weeks.

## 2020-01-01 DIAGNOSIS — I10 Essential (primary) hypertension: Secondary | ICD-10-CM | POA: Diagnosis not present

## 2020-01-15 ENCOUNTER — Ambulatory Visit (INDEPENDENT_AMBULATORY_CARE_PROVIDER_SITE_OTHER): Payer: Medicare Other | Admitting: General Practice

## 2020-01-15 ENCOUNTER — Other Ambulatory Visit: Payer: Self-pay

## 2020-01-15 DIAGNOSIS — Z7901 Long term (current) use of anticoagulants: Secondary | ICD-10-CM | POA: Diagnosis not present

## 2020-01-15 DIAGNOSIS — I743 Embolism and thrombosis of arteries of the lower extremities: Secondary | ICD-10-CM

## 2020-01-15 LAB — POCT INR: INR: 1.7 — AB (ref 2.0–3.0)

## 2020-01-15 NOTE — Patient Instructions (Addendum)
Pre visit review using our clinic review tool, if applicable. No additional management support is needed unless otherwise documented below in the visit note.  Take 1 1/2 tablets today and change dosage and take 1 1/2 tablets daily except 1 tablet on Sundays only.  Re-check 3 weeks.

## 2020-01-20 NOTE — Progress Notes (Signed)
I have reviewed and agree.

## 2020-02-05 ENCOUNTER — Other Ambulatory Visit: Payer: Self-pay

## 2020-02-05 ENCOUNTER — Ambulatory Visit (INDEPENDENT_AMBULATORY_CARE_PROVIDER_SITE_OTHER): Payer: Medicare Other | Admitting: General Practice

## 2020-02-05 DIAGNOSIS — I743 Embolism and thrombosis of arteries of the lower extremities: Secondary | ICD-10-CM

## 2020-02-05 DIAGNOSIS — Z7901 Long term (current) use of anticoagulants: Secondary | ICD-10-CM | POA: Diagnosis not present

## 2020-02-05 LAB — POCT INR: INR: 2.6 (ref 2.0–3.0)

## 2020-02-05 NOTE — Progress Notes (Signed)
I have reviewed and agree.

## 2020-02-05 NOTE — Patient Instructions (Addendum)
Pre visit review using our clinic review tool, if applicable. No additional management support is needed unless otherwise documented below in the visit note.  Continue to take 1 1/2 tablets daily except 1 tablet on Sundays only.  Re-check 4 weeks.

## 2020-02-25 DIAGNOSIS — R109 Unspecified abdominal pain: Secondary | ICD-10-CM | POA: Diagnosis not present

## 2020-02-25 DIAGNOSIS — Z8719 Personal history of other diseases of the digestive system: Secondary | ICD-10-CM | POA: Diagnosis not present

## 2020-02-25 DIAGNOSIS — Z9049 Acquired absence of other specified parts of digestive tract: Secondary | ICD-10-CM | POA: Diagnosis not present

## 2020-02-25 DIAGNOSIS — D6859 Other primary thrombophilia: Secondary | ICD-10-CM | POA: Diagnosis not present

## 2020-03-04 ENCOUNTER — Ambulatory Visit (INDEPENDENT_AMBULATORY_CARE_PROVIDER_SITE_OTHER): Payer: Medicare Other | Admitting: General Practice

## 2020-03-04 ENCOUNTER — Other Ambulatory Visit: Payer: Self-pay

## 2020-03-04 DIAGNOSIS — Z7901 Long term (current) use of anticoagulants: Secondary | ICD-10-CM | POA: Diagnosis not present

## 2020-03-04 DIAGNOSIS — I743 Embolism and thrombosis of arteries of the lower extremities: Secondary | ICD-10-CM | POA: Diagnosis not present

## 2020-03-04 LAB — POCT INR: INR: 3.1 — AB (ref 2.0–3.0)

## 2020-03-04 NOTE — Patient Instructions (Addendum)
Pre visit review using our clinic review tool, if applicable. No additional management support is needed unless otherwise documented below in the visit note.  Skip coumadin today (7/29) and then continue to take 1 1/2 tablets daily except 1 tablet on Sundays only.  Re-check 4 weeks.

## 2020-03-04 NOTE — Progress Notes (Signed)
I have reviewed and agree.

## 2020-03-13 ENCOUNTER — Other Ambulatory Visit: Payer: Self-pay | Admitting: Internal Medicine

## 2020-03-13 DIAGNOSIS — I10 Essential (primary) hypertension: Secondary | ICD-10-CM

## 2020-03-15 ENCOUNTER — Other Ambulatory Visit: Payer: Self-pay

## 2020-03-15 ENCOUNTER — Ambulatory Visit (INDEPENDENT_AMBULATORY_CARE_PROVIDER_SITE_OTHER): Payer: Medicare Other

## 2020-03-15 ENCOUNTER — Ambulatory Visit (INDEPENDENT_AMBULATORY_CARE_PROVIDER_SITE_OTHER): Payer: Medicare Other | Admitting: Internal Medicine

## 2020-03-15 ENCOUNTER — Encounter: Payer: Self-pay | Admitting: Internal Medicine

## 2020-03-15 VITALS — BP 132/80 | HR 80 | Temp 98.3°F | Resp 16 | Ht 69.0 in | Wt 231.0 lb

## 2020-03-15 DIAGNOSIS — M10372 Gout due to renal impairment, left ankle and foot: Secondary | ICD-10-CM | POA: Diagnosis not present

## 2020-03-15 DIAGNOSIS — M25572 Pain in left ankle and joints of left foot: Secondary | ICD-10-CM | POA: Diagnosis not present

## 2020-03-15 DIAGNOSIS — R600 Localized edema: Secondary | ICD-10-CM | POA: Diagnosis not present

## 2020-03-15 DIAGNOSIS — I743 Embolism and thrombosis of arteries of the lower extremities: Secondary | ICD-10-CM

## 2020-03-15 MED ORDER — METHYLPREDNISOLONE 4 MG PO TBPK: ORAL_TABLET | ORAL | 0 refills | Status: AC

## 2020-03-15 MED ORDER — COLCHICINE 0.6 MG PO CAPS: 1.0000 | ORAL_CAPSULE | Freq: Every day | ORAL | 0 refills | Status: DC

## 2020-03-15 NOTE — Patient Instructions (Signed)
Gout  Gout is a condition that causes painful swelling of the joints. Gout is a type of inflammation of the joints (arthritis). This condition is caused by having too much uric acid in the body. Uric acid is a chemical that forms when the body breaks down substances called purines. Purines are important for building body proteins. When the body has too much uric acid, sharp crystals can form and build up inside the joints. This causes pain and swelling. Gout attacks can happen quickly and may be very painful (acute gout). Over time, the attacks can affect more joints and become more frequent (chronic gout). Gout can also cause uric acid to build up under the skin and inside the kidneys. What are the causes? This condition is caused by too much uric acid in your blood. This can happen because:  Your kidneys do not remove enough uric acid from your blood. This is the most common cause.  Your body makes too much uric acid. This can happen with some cancers and cancer treatments. It can also occur if your body is breaking down too many red blood cells (hemolytic anemia).  You eat too many foods that are high in purines. These foods include organ meats and some seafood. Alcohol, especially beer, is also high in purines. A gout attack may be triggered by trauma or stress. What increases the risk? You are more likely to develop this condition if you:  Have a family history of gout.  Are male and middle-aged.  Are male and have gone through menopause.  Are obese.  Frequently drink alcohol, especially beer.  Are dehydrated.  Lose weight too quickly.  Have an organ transplant.  Have lead poisoning.  Take certain medicines, including aspirin, cyclosporine, diuretics, levodopa, and niacin.  Have kidney disease.  Have a skin condition called psoriasis. What are the signs or symptoms? An attack of acute gout happens quickly. It usually occurs in just one joint. The most common place is  the big toe. Attacks often start at night. Other joints that may be affected include joints of the feet, ankle, knee, fingers, wrist, or elbow. Symptoms of this condition may include:  Severe pain.  Warmth.  Swelling.  Stiffness.  Tenderness. The affected joint may be very painful to touch.  Shiny, red, or purple skin.  Chills and fever. Chronic gout may cause symptoms more frequently. More joints may be involved. You may also have white or yellow lumps (tophi) on your hands or feet or in other areas near your joints. How is this diagnosed? This condition is diagnosed based on your symptoms, medical history, and physical exam. You may have tests, such as:  Blood tests to measure uric acid levels.  Removal of joint fluid with a thin needle (aspiration) to look for uric acid crystals.  X-rays to look for joint damage. How is this treated? Treatment for this condition has two phases: treating an acute attack and preventing future attacks. Acute gout treatment may include medicines to reduce pain and swelling, including:  NSAIDs.  Steroids. These are strong anti-inflammatory medicines that can be taken by mouth (orally) or injected into a joint.  Colchicine. This medicine relieves pain and swelling when it is taken soon after an attack. It can be given by mouth or through an IV. Preventive treatment may include:  Daily use of smaller doses of NSAIDs or colchicine.  Use of a medicine that reduces uric acid levels in your blood.  Changes to your diet. You may   need to see a dietitian about what to eat and drink to prevent gout. Follow these instructions at home: During a gout attack   If directed, put ice on the affected area: ? Put ice in a plastic bag. ? Place a towel between your skin and the bag. ? Leave the ice on for 20 minutes, 2-3 times a day.  Raise (elevate) the affected joint above the level of your heart as often as possible.  Rest the joint as much as possible.  If the affected joint is in your leg, you may be given crutches to use.  Follow instructions from your health care provider about eating or drinking restrictions. Avoiding future gout attacks  Follow a low-purine diet as told by your dietitian or health care provider. Avoid foods and drinks that are high in purines, including liver, kidney, anchovies, asparagus, herring, mushrooms, mussels, and beer.  Maintain a healthy weight or lose weight if you are overweight. If you want to lose weight, talk with your health care provider. It is important that you do not lose weight too quickly.  Start or maintain an exercise program as told by your health care provider. Eating and drinking  Drink enough fluids to keep your urine pale yellow.  If you drink alcohol: ? Limit how much you use to:  0-1 drink a day for women.  0-2 drinks a day for men. ? Be aware of how much alcohol is in your drink. In the [place] one drink equals one 12 oz bottle of beer (355 mL) one 5 oz glass of wine (148 mL), or one 1 oz glass of hard liquor (44 mL). General instructions  Take over-the-counter and prescription medicines only as told by your health care provider.  Do not drive or use heavy machinery while taking prescription pain medicine.  Return to your normal activities as told by your health care provider. Ask your health care provider what activities are safe for you.  Keep all follow-up visits as told by your health care provider. This is important. Contact a health care provider if you have:  Another gout attack.  Continuing symptoms of a gout attack after 10 days of treatment.  Side effects from your medicines.  Chills or a fever.  Burning pain when you urinate.  Pain in your lower back or belly. Get help right away if you:  Have severe or uncontrolled pain.  Cannot urinate. Summary  Gout is painful swelling of the joints caused by inflammation.  The most common site of pain is the big  toe, but it can affect other joints in the body.  Medicines and dietary changes can help to prevent and treat gout attacks. This information is not intended to replace advice given to you by your health care provider. Make sure you discuss any questions you have with your health care provider. Document Revised: 02/13/2018 Document Reviewed: 02/13/2018 Elsevier Patient Education  2020 [employer] Inc[employer]  

## 2020-03-15 NOTE — Progress Notes (Signed)
Subjective:  Patient ID: Rick Edwards, male    DOB: August 15, 1955  Age: 64 y.o. MRN: [id number]  CC: Ankle Pain  This visit occurred during the SARS-CoV-2 public health emergency.  Safety protocols were in place, including screening questions prior to the visit, additional usage of staff PPE, and extensive cleaning of exam room while observing appropriate contact time as indicated for disinfecting solutions.    HPI Rick Edwards presents for a 3-day history of nontraumatic pain and swelling in his left ankle.  Outpatient Medications Prior to Visit  Medication Sig Dispense Refill   amLODipine (NORVASC) 10 MG tablet Take 10 mg by mouth at bedtime.     atorvastatin (LIPITOR) 40 MG tablet Take 1 tablet (40 mg total) by mouth daily. 90 tablet 1   carvedilol (COREG) 25 MG tablet TAKE 1 TABLET (25 MG TOTAL) BY MOUTH 2 (TWO) TIMES DAILY WITH A MEAL. 180 tablet 0   chlorthalidone (HYGROTON) 25 MG tablet Take 25 mg by mouth every morning.     hyoscyamine (LEVSIN SL) 0.125 MG SL tablet Place under the tongue 2 (two) times daily as needed.     icosapent Ethyl (VASCEPA) 1 g capsule Take 2 capsules (2 g total) by mouth 2 (two) times daily. 360 capsule 1   irbesartan (AVAPRO) 150 MG tablet Take 1 tablet (150 mg total) by mouth daily. 90 tablet 1   spironolactone (ALDACTONE) 25 MG tablet Take 1 tablet (25 mg total) by mouth daily. 90 tablet 1   warfarin (COUMADIN) 5 MG tablet Take 1 1/2 tablets daily except take 1 tablet on Mon Wed and Fri or Take as directed by anticoagulation clinic 120 tablet 1   No facility-administered medications prior to visit.    ROS Review of Systems  Constitutional: Negative for chills, diaphoresis, fatigue and fever.  HENT: Negative.   Eyes: Negative for visual disturbance.  Respiratory: Negative for cough, chest tightness, shortness of breath and wheezing.   Cardiovascular: Negative for chest pain, palpitations and leg swelling.  Gastrointestinal:  Negative for abdominal pain, constipation, diarrhea, nausea and vomiting.  Endocrine: Negative.  Negative for cold intolerance.  Genitourinary: Negative.  Negative for difficulty urinating.  Musculoskeletal: Positive for arthralgias. Negative for back pain and myalgias.  Skin: Negative.   Neurological: Negative.  Negative for dizziness and weakness.  Hematological: Negative for adenopathy. Does not bruise/bleed easily.  Psychiatric/Behavioral: Negative.     Objective:  BP 132/80 (BP Location: Left Arm, Patient Position: Sitting, Cuff Size: Large)    Pulse 80    Temp 98.3 F (36.8 C) (Oral)    Resp 16    Ht 5\' 9"  (1.753 m)    Wt 231 lb (104.8 kg)    SpO2 96%    BMI 34.11 kg/m   BP Readings from Last 3 Encounters:  03/15/20 132/80  11/27/19 (!) 220/110  08/13/19 (!) 193/110    Wt Readings from Last 3 Encounters:  03/15/20 231 lb (104.8 kg)  11/27/19 244 lb (110.7 kg)  08/12/19 231 lb 6.4 oz (105 kg)    Physical Exam Vitals reviewed.  Constitutional:      General: He is not in acute distress.    Appearance: He is ill-appearing (in a wheelchair). He is not toxic-appearing.  HENT:     Nose: Nose normal.     Mouth/Throat:     Mouth: Mucous membranes are moist.  Eyes:     General: No scleral icterus.    Conjunctiva/sclera: Conjunctivae normal.  Cardiovascular:  Rate and Rhythm: Normal rate and regular rhythm.  Pulmonary:     Effort: Pulmonary effort is normal.     Breath sounds: No stridor. No wheezing, rhonchi or rales.  Abdominal:     General: Abdomen is flat. Bowel sounds are normal. There is no distension.     Palpations: Abdomen is soft. There is no hepatomegaly, splenomegaly or mass.  Musculoskeletal:        General: Tenderness present. Normal range of motion.     Cervical back: Neck supple.     Right lower leg: No edema.     Left lower leg: Swelling and tenderness present. No edema.     Comments: Left ankle is mildly swollen, warm, and diffusely tender over both  malleoli.  Skin:    General: Skin is warm.      Lab Results  Component Value Date   WBC 6.0 11/27/2019   HGB 15.3 11/27/2019   HCT 44.9 11/27/2019   PLT 338.0 11/27/2019   GLUCOSE 112 (H) 12/03/2019   CHOL 155 11/27/2019   TRIG 132.0 11/27/2019   HDL 36.90 (L) 11/27/2019   LDLDIRECT 97.0 05/05/2019   LDLCALC 91 11/27/2019   ALT 32 07/07/2019   AST 19 07/07/2019   NA 135 12/03/2019   K 3.8 12/03/2019   CL 100 12/03/2019   CREATININE 0.93 12/03/2019   BUN 11 12/03/2019   CO2 30 12/03/2019   TSH 2.05 05/05/2019   PSA 1.5 11/27/2019   INR 3.1 (A) 03/04/2020   HGBA1C 6.0 12/03/2019   MICROALBUR 2.2 (H) 05/05/2019   DG Ankle Complete Left  Result Date: 03/15/2020 CLINICAL DATA:  Ankle pain and swelling for 2 days, no known injury EXAM: LEFT ANKLE COMPLETE - 3+ VIEW COMPARISON:  None. FINDINGS: There is no evidence of fracture, dislocation, or joint effusion. Mild ankle mortise arthrosis. Mild soft tissue edema. IMPRESSION: 1. No fracture or dislocation of the left ankle. Mild ankle mortise arthrosis. 2.  Mild soft tissue edema. Electronically Signed   By: Eddie Candle M.D.   On: 03/15/2020 11:54    Assessment & Plan:   Rick Edwards was seen today for ankle pain.  Diagnoses and all orders for this visit:  Acute left ankle pain- Based on his symptoms, exam, and plain films I think he is having an acute gouty arthropathy.  Will treat this with renally dosed colchicine and a 6-day course of methylprednisolone. -     DG Ankle Complete Left; Future  Acute gout due to renal impairment involving left ankle -     methylPREDNISolone (MEDROL DOSEPAK) 4 MG TBPK tablet; TAKE AS DIRECTED -     Colchicine (MITIGARE) 0.6 MG CAPS; Take 1 tablet by mouth daily.   I am having Rick Meigs. Edwards "Rick Edwards" start on methylPREDNISolone and Colchicine. I am also having him maintain his atorvastatin, icosapent Ethyl, irbesartan, spironolactone, warfarin, carvedilol, amLODipine, hyoscyamine, and  chlorthalidone.  Meds ordered this encounter  Medications   methylPREDNISolone (MEDROL DOSEPAK) 4 MG TBPK tablet    Sig: TAKE AS DIRECTED    Dispense:  21 tablet    Refill:  0   Colchicine (MITIGARE) 0.6 MG CAPS    Sig: Take 1 tablet by mouth daily.    Dispense:  7 capsule    Refill:  0     Follow-up: Return in about 3 weeks (around 04/05/2020).  Scarlette Calico, MD

## 2020-04-01 ENCOUNTER — Ambulatory Visit (INDEPENDENT_AMBULATORY_CARE_PROVIDER_SITE_OTHER): Payer: Medicare Other | Admitting: General Practice

## 2020-04-01 ENCOUNTER — Other Ambulatory Visit: Payer: Self-pay

## 2020-04-01 DIAGNOSIS — I743 Embolism and thrombosis of arteries of the lower extremities: Secondary | ICD-10-CM

## 2020-04-01 DIAGNOSIS — Z7901 Long term (current) use of anticoagulants: Secondary | ICD-10-CM | POA: Diagnosis not present

## 2020-04-01 LAB — POCT INR: INR: 3.1 — AB (ref 2.0–3.0)

## 2020-04-01 NOTE — Progress Notes (Signed)
I have reviewed and agree.

## 2020-04-01 NOTE — Patient Instructions (Addendum)
Pre visit review using our clinic review tool, if applicable. No additional management support is needed unless otherwise documented below in the visit note.  Change dosage and take 1 1/2 tablets daily except 1 tablet on Sundays and Thursdays.   Re-check 5 weeks.

## 2020-04-26 DIAGNOSIS — Z8719 Personal history of other diseases of the digestive system: Secondary | ICD-10-CM | POA: Diagnosis not present

## 2020-04-26 DIAGNOSIS — R109 Unspecified abdominal pain: Secondary | ICD-10-CM | POA: Diagnosis not present

## 2020-04-26 DIAGNOSIS — Z9049 Acquired absence of other specified parts of digestive tract: Secondary | ICD-10-CM | POA: Diagnosis not present

## 2020-04-26 DIAGNOSIS — D6859 Other primary thrombophilia: Secondary | ICD-10-CM | POA: Diagnosis not present

## 2020-05-06 ENCOUNTER — Other Ambulatory Visit: Payer: Self-pay

## 2020-05-06 ENCOUNTER — Ambulatory Visit (INDEPENDENT_AMBULATORY_CARE_PROVIDER_SITE_OTHER): Payer: Medicare Other | Admitting: General Practice

## 2020-05-06 DIAGNOSIS — Z7901 Long term (current) use of anticoagulants: Secondary | ICD-10-CM | POA: Diagnosis not present

## 2020-05-06 DIAGNOSIS — I743 Embolism and thrombosis of arteries of the lower extremities: Secondary | ICD-10-CM

## 2020-05-06 LAB — POCT INR: INR: 3 (ref 2.0–3.0)

## 2020-05-06 NOTE — Progress Notes (Signed)
I have reviewed and agree.

## 2020-05-06 NOTE — Patient Instructions (Addendum)
Pre visit review using our clinic review tool, if applicable. No additional management support is needed unless otherwise documented below in the visit note.  Skip dosage today and then continue to take  1 1/2 tablets daily except 1 tablet on Sundays and Thursdays.   Re-check 4 weeks.

## 2020-06-01 ENCOUNTER — Encounter: Payer: Self-pay | Admitting: Internal Medicine

## 2020-06-01 ENCOUNTER — Other Ambulatory Visit: Payer: Self-pay

## 2020-06-01 ENCOUNTER — Ambulatory Visit (INDEPENDENT_AMBULATORY_CARE_PROVIDER_SITE_OTHER): Payer: Medicare Other | Admitting: Internal Medicine

## 2020-06-01 VITALS — BP 136/86 | HR 72 | Temp 98.2°F | Resp 16 | Ht 69.0 in | Wt 232.0 lb

## 2020-06-01 DIAGNOSIS — H6122 Impacted cerumen, left ear: Secondary | ICD-10-CM | POA: Insufficient documentation

## 2020-06-01 DIAGNOSIS — I743 Embolism and thrombosis of arteries of the lower extremities: Secondary | ICD-10-CM | POA: Diagnosis not present

## 2020-06-01 DIAGNOSIS — M1A39X0 Chronic gout due to renal impairment, multiple sites, without tophus (tophi): Secondary | ICD-10-CM | POA: Diagnosis not present

## 2020-06-01 DIAGNOSIS — N1831 Chronic kidney disease, stage 3a: Secondary | ICD-10-CM

## 2020-06-01 DIAGNOSIS — Z23 Encounter for immunization: Secondary | ICD-10-CM | POA: Diagnosis not present

## 2020-06-01 DIAGNOSIS — E118 Type 2 diabetes mellitus with unspecified complications: Secondary | ICD-10-CM

## 2020-06-01 DIAGNOSIS — M10372 Gout due to renal impairment, left ankle and foot: Secondary | ICD-10-CM

## 2020-06-01 DIAGNOSIS — I10 Essential (primary) hypertension: Secondary | ICD-10-CM | POA: Diagnosis not present

## 2020-06-01 DIAGNOSIS — H6123 Impacted cerumen, bilateral: Secondary | ICD-10-CM | POA: Diagnosis not present

## 2020-06-01 LAB — CBC WITH DIFFERENTIAL/PLATELET
Basophils Absolute: 0.1 10*3/uL (ref 0.0–0.1)
Basophils Relative: 0.6 % (ref 0.0–3.0)
Eosinophils Absolute: 0.1 10*3/uL (ref 0.0–0.7)
Eosinophils Relative: 1.6 % (ref 0.0–5.0)
HCT: 43.8 % (ref 39.0–52.0)
Hemoglobin: 14.9 g/dL (ref 13.0–17.0)
Lymphocytes Relative: 23.4 % (ref 12.0–46.0)
Lymphs Abs: 2.1 10*3/uL (ref 0.7–4.0)
MCHC: 34 g/dL (ref 30.0–36.0)
MCV: 89.3 fl (ref 78.0–100.0)
Monocytes Absolute: 0.9 10*3/uL (ref 0.1–1.0)
Monocytes Relative: 9.8 % (ref 3.0–12.0)
Neutro Abs: 5.8 10*3/uL (ref 1.4–7.7)
Neutrophils Relative %: 64.6 % (ref 43.0–77.0)
Platelets: 460 10*3/uL — ABNORMAL HIGH (ref 150.0–400.0)
RBC: 4.91 Mil/uL (ref 4.22–5.81)
RDW: 14.7 % (ref 11.5–15.5)
WBC: 9 10*3/uL (ref 4.0–10.5)

## 2020-06-01 LAB — MICROALBUMIN / CREATININE URINE RATIO
Creatinine,U: 195.5 mg/dL
Microalb Creat Ratio: 5.3 mg/g (ref 0.0–30.0)
Microalb, Ur: 10.4 mg/dL — ABNORMAL HIGH (ref 0.0–1.9)

## 2020-06-01 LAB — BASIC METABOLIC PANEL
BUN: 13 mg/dL (ref 6–23)
CO2: 31 mEq/L (ref 19–32)
Calcium: 9.5 mg/dL (ref 8.4–10.5)
Chloride: 95 mEq/L — ABNORMAL LOW (ref 96–112)
Creatinine, Ser: 1.03 mg/dL (ref 0.40–1.50)
GFR: 78.15 mL/min (ref >60.00)
Glucose, Bld: 91 mg/dL (ref 70–99)
Potassium: 3.8 mEq/L (ref 3.5–5.1)
Sodium: 134 mEq/L — ABNORMAL LOW (ref 135–145)

## 2020-06-01 LAB — URINALYSIS, ROUTINE W REFLEX MICROSCOPIC
Bilirubin Urine: NEGATIVE
Hgb urine dipstick: NEGATIVE
Ketones, ur: NEGATIVE
Leukocytes,Ua: NEGATIVE
Nitrite: NEGATIVE
RBC / HPF: NONE SEEN (ref 0–?)
Specific Gravity, Urine: 1.025 (ref 1.000–1.030)
Total Protein, Urine: NEGATIVE
Urine Glucose: NEGATIVE
Urobilinogen, UA: 0.2 (ref 0.0–1.0)
pH: 6 (ref 5.0–8.0)

## 2020-06-01 LAB — URIC ACID: Uric Acid, Serum: 10.1 mg/dL — ABNORMAL HIGH (ref 4.0–7.8)

## 2020-06-01 LAB — HEMOGLOBIN A1C: Hgb A1c MFr Bld: 6.2 % (ref 4.6–6.5)

## 2020-06-01 MED ORDER — COLCHICINE 0.6 MG PO CAPS: 1.0000 | ORAL_CAPSULE | Freq: Every day | ORAL | 1 refills | Status: DC

## 2020-06-01 MED ORDER — ALLOPURINOL 100 MG PO TABS: 100.0000 mg | ORAL_TABLET | Freq: Every day | ORAL | 0 refills | Status: DC

## 2020-06-01 NOTE — Progress Notes (Signed)
Subjective:  Patient ID: Rick Edwards, male    DOB: [birth year]  Age: 64 y.o. MRN: [id number]  CC: Hypertension, Hyperlipidemia, and Diabetes  This visit occurred during the SARS-CoV-2 public health emergency.  Safety protocols were in place, including screening questions prior to the visit, additional usage of staff PPE, and extensive cleaning of exam room while observing appropriate contact time as indicated for disinfecting solutions.    HPI Rick Edwards presents for f/up - He complains of a several week history of decreased level of hearing in both ears.  He tells me he is using his hearing aids but he feels like they are not working.  He is active and denies any recent episodes of chest pain, shortness of breath, palpitations, edema, or fatigue.  He has had no recent flareups of gout and denies joint pain or swelling.  Outpatient Medications Prior to Visit  Medication Sig Dispense Refill  . amLODipine (NORVASC) 10 MG tablet Take 10 mg by mouth at bedtime.    Marland Kitchen atorvastatin (LIPITOR) 40 MG tablet Take 1 tablet (40 mg total) by mouth daily. 90 tablet 1  . carvedilol (COREG) 25 MG tablet TAKE 1 TABLET (25 MG TOTAL) BY MOUTH 2 (TWO) TIMES DAILY WITH A MEAL. 180 tablet 0  . chlorthalidone (HYGROTON) 25 MG tablet Take 25 mg by mouth every morning.    . hyoscyamine (LEVSIN SL) 0.125 MG SL tablet Place under the tongue 2 (two) times daily as needed.    Marland Kitchen icosapent Ethyl (VASCEPA) 1 g capsule Take 2 capsules (2 g total) by mouth 2 (two) times daily. 360 capsule 1  . irbesartan (AVAPRO) 150 MG tablet Take 1 tablet (150 mg total) by mouth daily. 90 tablet 1  . spironolactone (ALDACTONE) 25 MG tablet Take 1 tablet (25 mg total) by mouth daily. 90 tablet 1  . warfarin (COUMADIN) 5 MG tablet Take 1 1/2 tablets daily except take 1 tablet on Mon Wed and Fri or Take as directed by anticoagulation clinic 120 tablet 1  . Colchicine (MITIGARE) 0.6 MG CAPS Take 1 tablet by mouth daily. 7 capsule 0     No facility-administered medications prior to visit.    ROS Review of Systems  Constitutional: Negative.  Negative for appetite change, chills, diaphoresis, fatigue and fever.  HENT: Positive for hearing loss. Negative for ear discharge, ear pain, sinus pressure, sore throat and voice change.   Eyes: Negative.   Respiratory: Negative for cough, chest tightness, shortness of breath and wheezing.   Cardiovascular: Negative for chest pain, palpitations and leg swelling.  Gastrointestinal: Negative for abdominal pain, constipation, diarrhea, nausea and vomiting.  Endocrine: Negative.   Genitourinary: Negative.  Negative for difficulty urinating and dysuria.  Musculoskeletal: Negative for arthralgias, back pain, myalgias and neck pain.  Skin: Negative.  Negative for color change and pallor.  Neurological: Negative.  Negative for dizziness, weakness and light-headedness.  Hematological: Negative for adenopathy. Does not bruise/bleed easily.  Psychiatric/Behavioral: Negative.     Objective:  BP 136/86   Pulse 72   Temp 98.2 F (36.8 C) (Oral)   Resp 16   Ht 5\' 9"  (1.753 m)   Wt 232 lb (105.2 kg)   SpO2 97%   BMI 34.26 kg/m   BP Readings from Last 3 Encounters:  06/01/20 136/86  03/15/20 132/80  11/27/19 (!) 220/110    Wt Readings from Last 3 Encounters:  06/01/20 232 lb (105.2 kg)  03/15/20 231 lb (104.8 kg)  11/27/19 244 lb (110.7  kg)    Physical Exam Vitals reviewed.  HENT:     Right Ear: Tympanic membrane normal. Decreased hearing noted. There is impacted cerumen. Tympanic membrane is not injected.     Left Ear: Tympanic membrane normal. Decreased hearing noted. There is impacted cerumen. Tympanic membrane is not injected.     Ears:     Comments: I put Colace in both ears and then irrigated them with water and used an ear pick to remove the cerumen.  Afterwards he tells me that his hearing aids are working well.  He tolerated the procedure well.  The examination  afterwards is normal. Eyes:     General: No scleral icterus.    Conjunctiva/sclera: Conjunctivae normal.  Cardiovascular:     Rate and Rhythm: Normal rate and regular rhythm.     Heart sounds: No murmur heard.   Pulmonary:     Effort: Pulmonary effort is normal.     Breath sounds: No stridor. No wheezing, rhonchi or rales.  Abdominal:     General: Abdomen is protuberant. Bowel sounds are normal.     Palpations: Abdomen is soft. There is no hepatomegaly or mass.     Tenderness: There is no abdominal tenderness.     Hernia: No hernia is present.  Musculoskeletal:        General: Normal range of motion.     Cervical back: Neck supple.     Right lower leg: No edema.     Left lower leg: No edema.  Lymphadenopathy:     Cervical: No cervical adenopathy.  Skin:    General: Skin is warm and dry.     Coloration: Skin is not pale.     Findings: No lesion.  Neurological:     General: No focal deficit present.     Mental Status: He is alert.  Psychiatric:        Mood and Affect: Mood normal.        Behavior: Behavior normal.     Lab Results  Component Value Date   WBC 9.0 06/01/2020   HGB 14.9 06/01/2020   HCT 43.8 06/01/2020   PLT 460.0 (H) 06/01/2020   GLUCOSE 91 06/01/2020   CHOL 155 11/27/2019   TRIG 132.0 11/27/2019   HDL 36.90 (L) 11/27/2019   LDLDIRECT 97.0 05/05/2019   LDLCALC 91 11/27/2019   ALT 32 07/07/2019   AST 19 07/07/2019   NA 134 (L) 06/01/2020   K 3.8 06/01/2020   CL 95 (L) 06/01/2020   CREATININE 1.03 06/01/2020   BUN 13 06/01/2020   CO2 31 06/01/2020   TSH 2.05 05/05/2019   PSA 1.5 11/27/2019   INR 3.2 (A) 06/03/2020   HGBA1C 6.2 06/01/2020   MICROALBUR 10.4 (H) 06/01/2020    No results found.  Assessment & Plan:   Rick Edwards was seen today for hypertension, hyperlipidemia and diabetes.  Diagnoses and all orders for this visit:  Essential hypertension, malignant- His blood pressure is adequately well controlled. -     Basic metabolic panel;  Future -     Urinalysis, Routine w reflex microscopic; Future -     CBC with Differential/Platelet; Future -     CBC with Differential/Platelet -     Urinalysis, Routine w reflex microscopic -     Basic metabolic panel  Type II diabetes mellitus with manifestations ([place])- His A1c is at 6.2%.  His blood sugar is adequately well controlled.  Medical therapy is not indicated. -     Basic metabolic  panel; Future -     Hemoglobin A1c; Future -     Microalbumin / creatinine urine ratio; Future -     HM Diabetes Foot Exam -     Ambulatory referral to Ophthalmology -     Microalbumin / creatinine urine ratio -     Hemoglobin A1c -     Basic metabolic panel  Stage 3a chronic kidney disease ([place])- His renal function is normal now.  He has mild proteinuria.  He is followed by nephrology. -     Basic metabolic panel; Future -     Urinalysis, Routine w reflex microscopic; Future -     CBC with Differential/Platelet; Future -     CBC with Differential/Platelet -     Urinalysis, Routine w reflex microscopic -     Basic metabolic panel  Hearing loss due to cerumen impaction, bilateral- Improvement noted after the cerumen was removed.  Chronic gout due to renal impairment of multiple sites without tophus- His uric acid level is high.  I recommended that he start taking allopurinol. -     Uric acid; Future -     Uric acid -     allopurinol (ZYLOPRIM) 100 MG tablet; Take 1 tablet (100 mg total) by mouth daily. -     Colchicine (MITIGARE) 0.6 MG CAPS; Take 1 tablet by mouth daily.  Acute gout due to renal impairment involving left ankle  Other orders -     Flu Vaccine QUAD 6+ mos PF IM (Fluarix Quad PF)   I am having [place] "Reggie" start on allopurinol. I am also having him maintain his atorvastatin, icosapent Ethyl, irbesartan, spironolactone, warfarin, carvedilol, amLODipine, hyoscyamine, chlorthalidone, and Colchicine.  Meds ordered this encounter  Medications  . allopurinol  (ZYLOPRIM) 100 MG tablet    Sig: Take 1 tablet (100 mg total) by mouth daily.    Dispense:  90 tablet    Refill:  0  . Colchicine (MITIGARE) 0.6 MG CAPS    Sig: Take 1 tablet by mouth daily.    Dispense:  90 capsule    Refill:  1     Follow-up: Return in about 6 months (around 11/30/2020).  Scarlette Calico, MD

## 2020-06-01 NOTE — Patient Instructions (Signed)

## 2020-06-01 NOTE — Progress Notes (Signed)
Patient consent obtained. Irrigation with water and peroxide performed. Full view of tympanic membranes after procedure.  Patient tolerated procedure well.   

## 2020-06-03 ENCOUNTER — Other Ambulatory Visit: Payer: Self-pay

## 2020-06-03 ENCOUNTER — Ambulatory Visit (INDEPENDENT_AMBULATORY_CARE_PROVIDER_SITE_OTHER): Payer: Medicare Other | Admitting: General Practice

## 2020-06-03 DIAGNOSIS — I743 Embolism and thrombosis of arteries of the lower extremities: Secondary | ICD-10-CM

## 2020-06-03 DIAGNOSIS — Z7901 Long term (current) use of anticoagulants: Secondary | ICD-10-CM | POA: Diagnosis not present

## 2020-06-03 LAB — POCT INR: INR: 3.2 — AB (ref 2.0–3.0)

## 2020-06-03 NOTE — Patient Instructions (Addendum)
Pre visit review using our clinic review tool, if applicable. No additional management support is needed unless otherwise documented below in the visit note.  Skip dosage today and then change dosage and take 1 1/2 tablets daily except 1 tablet on Sundays Tuesdays and Thursdays.   Re-check 4 weeks.

## 2020-06-03 NOTE — Progress Notes (Signed)
I have reviewed and agree.

## 2020-06-06 ENCOUNTER — Encounter: Payer: Self-pay | Admitting: Internal Medicine

## 2020-06-08 ENCOUNTER — Encounter: Payer: Self-pay | Admitting: Internal Medicine

## 2020-06-09 ENCOUNTER — Other Ambulatory Visit: Payer: Self-pay | Admitting: Internal Medicine

## 2020-06-09 DIAGNOSIS — I10 Essential (primary) hypertension: Secondary | ICD-10-CM

## 2020-07-08 ENCOUNTER — Ambulatory Visit (INDEPENDENT_AMBULATORY_CARE_PROVIDER_SITE_OTHER): Payer: Medicare Other | Admitting: General Practice

## 2020-07-08 ENCOUNTER — Other Ambulatory Visit: Payer: Self-pay

## 2020-07-08 DIAGNOSIS — I743 Embolism and thrombosis of arteries of the lower extremities: Secondary | ICD-10-CM | POA: Diagnosis not present

## 2020-07-08 DIAGNOSIS — Z7901 Long term (current) use of anticoagulants: Secondary | ICD-10-CM | POA: Diagnosis not present

## 2020-07-08 LAB — POCT INR: INR: 3.5 — AB (ref 2.0–3.0)

## 2020-07-08 NOTE — Patient Instructions (Addendum)
Pre visit review using our clinic review tool, if applicable. No additional management support is needed unless otherwise documented below in the visit note.  Skip dosage today and then change dosage and take 1 tablet daily except take 1 1/2 tablets on Mon Wed and Fridays.  Re-check in 4 weeks.

## 2020-07-08 NOTE — Progress Notes (Signed)
I have reviewed and agree.

## 2020-07-23 DIAGNOSIS — H3589 Other specified retinal disorders: Secondary | ICD-10-CM | POA: Diagnosis not present

## 2020-07-23 DIAGNOSIS — H2513 Age-related nuclear cataract, bilateral: Secondary | ICD-10-CM | POA: Diagnosis not present

## 2020-08-04 ENCOUNTER — Other Ambulatory Visit: Payer: Self-pay

## 2020-08-05 ENCOUNTER — Ambulatory Visit (INDEPENDENT_AMBULATORY_CARE_PROVIDER_SITE_OTHER): Payer: Medicare Other | Admitting: General Practice

## 2020-08-05 DIAGNOSIS — Z7901 Long term (current) use of anticoagulants: Secondary | ICD-10-CM | POA: Diagnosis not present

## 2020-08-05 DIAGNOSIS — I743 Embolism and thrombosis of arteries of the lower extremities: Secondary | ICD-10-CM | POA: Diagnosis not present

## 2020-08-05 LAB — POCT INR: INR: 2.7 (ref 2.0–3.0)

## 2020-08-05 NOTE — Patient Instructions (Addendum)
Pre visit review using our clinic review tool, if applicable. No additional management support is needed unless otherwise documented below in the visit note.  Continue to take 1 tablet daily except take 1 1/2 tablets on Mon Wed and Fridays.  Re-check in 4 weeks.   

## 2020-08-15 ENCOUNTER — Other Ambulatory Visit: Payer: Self-pay | Admitting: Internal Medicine

## 2020-08-15 DIAGNOSIS — I743 Embolism and thrombosis of arteries of the lower extremities: Secondary | ICD-10-CM

## 2020-08-23 NOTE — Progress Notes (Signed)
Medical screening examination/treatment/procedure(s) were performed by non-physician practitioner and as supervising physician I was immediately available for consultation/collaboration. I agree with above. Jahrel Borthwick, MD  

## 2020-08-24 ENCOUNTER — Other Ambulatory Visit: Payer: Self-pay | Admitting: Internal Medicine

## 2020-08-24 DIAGNOSIS — M1A39X0 Chronic gout due to renal impairment, multiple sites, without tophus (tophi): Secondary | ICD-10-CM

## 2020-09-02 ENCOUNTER — Ambulatory Visit (INDEPENDENT_AMBULATORY_CARE_PROVIDER_SITE_OTHER): Payer: Medicare Other | Admitting: General Practice

## 2020-09-02 ENCOUNTER — Other Ambulatory Visit: Payer: Self-pay | Admitting: Internal Medicine

## 2020-09-02 ENCOUNTER — Other Ambulatory Visit: Payer: Self-pay

## 2020-09-02 DIAGNOSIS — Z7901 Long term (current) use of anticoagulants: Secondary | ICD-10-CM

## 2020-09-02 DIAGNOSIS — I743 Embolism and thrombosis of arteries of the lower extremities: Secondary | ICD-10-CM

## 2020-09-02 DIAGNOSIS — E118 Type 2 diabetes mellitus with unspecified complications: Secondary | ICD-10-CM

## 2020-09-02 LAB — POCT INR: INR: 2.1 (ref 2.0–3.0)

## 2020-09-02 NOTE — Patient Instructions (Signed)
Pre visit review using our clinic review tool, if applicable. No additional management support is needed unless otherwise documented below in the visit note.  Continue to take 1 tablet daily except take 1 1/2 tablets on Mon Wed and Fridays.  Re-check in 4 weeks.

## 2020-09-02 NOTE — Progress Notes (Signed)
I have reviewed and agree.

## 2020-09-09 ENCOUNTER — Encounter: Payer: Self-pay | Admitting: Internal Medicine

## 2020-09-13 ENCOUNTER — Other Ambulatory Visit: Payer: Self-pay | Admitting: Internal Medicine

## 2020-09-13 DIAGNOSIS — I10 Essential (primary) hypertension: Secondary | ICD-10-CM

## 2020-09-20 ENCOUNTER — Other Ambulatory Visit: Payer: Self-pay | Admitting: Internal Medicine

## 2020-09-20 DIAGNOSIS — I10 Essential (primary) hypertension: Secondary | ICD-10-CM

## 2020-09-24 ENCOUNTER — Other Ambulatory Visit: Payer: Self-pay

## 2020-09-24 ENCOUNTER — Ambulatory Visit (INDEPENDENT_AMBULATORY_CARE_PROVIDER_SITE_OTHER): Payer: Medicare Other

## 2020-09-24 VITALS — BP 118/80 | HR 72 | Temp 98.2°F | Ht 69.0 in | Wt 226.0 lb

## 2020-09-24 DIAGNOSIS — Z0000 Encounter for general adult medical examination without abnormal findings: Secondary | ICD-10-CM | POA: Diagnosis not present

## 2020-09-24 NOTE — Patient Instructions (Addendum)
Rick Edwards , Thank you for taking time to come for your Medicare Wellness Visit. I appreciate your ongoing commitment to your health goals. Please review the following plan we discussed and let me know if I can assist you in the future.   Screening recommendations/referrals: Colonoscopy: 05/14/2018; due every 10 years Recommended yearly ophthalmology/optometry visit for glaucoma screening and checkup Recommended yearly dental visit for hygiene and checkup  Vaccinations: Influenza vaccine: 06/01/2020 Pneumococcal vaccine: up to date Tdap vaccine: 08/15/2018; due every 10 years (2030) Shingles vaccine: never done   Covid-19: up to date  Advanced directives: Please bring a copy of your health care power of attorney and living will to the office at your convenience.  Conditions/risks identified: Yes; Reviewed health maintenance screenings with patient today and relevant education, vaccines, and/or referrals were provided. Please continue to do your personal lifestyle choices by: daily care of teeth and gums, regular physical activity (goal should be 5 days a week for 30 minutes), eat a healthy diet, avoid tobacco and drug use, limiting any alcohol intake, taking a low-dose aspirin (if not allergic or have been advised by your provider otherwise) and taking vitamins and minerals as recommended by your provider. Continue doing brain stimulating activities (puzzles, reading, adult coloring books, staying active) to keep memory sharp. Continue to eat heart healthy diet (full of fruits, vegetables, whole grains, lean protein, water--limit salt, fat, and sugar intake) and increase physical activity as tolerated.  Next appointment: Please schedule your next Medicare Wellness Visit with your Nurse Health Advisor in 1 year by calling [phone].   Preventive Care 40-64 Years, Male Preventive care refers to lifestyle choices and visits with your health care provider that can promote health and  wellness. What does preventive care include?  A yearly physical exam. This is also called an annual well check.  Dental exams once or twice a year.  Routine eye exams. Ask your health care provider how often you should have your eyes checked.  Personal lifestyle choices, including:  Daily care of your teeth and gums.  Regular physical activity.  Eating a healthy diet.  Avoiding tobacco and drug use.  Limiting alcohol use.  Practicing safe sex.  Taking low-dose aspirin every day starting at age 50. What happens during an annual well check? The services and screenings done by your health care provider during your annual well check will depend on your age, overall health, lifestyle risk factors, and family history of disease. Counseling  Your health care provider may ask you questions about your:  Alcohol use.  Tobacco use.  Drug use.  Emotional well-being.  Home and relationship well-being.  Sexual activity.  Eating habits.  Work and work Statistician. Screening  You may have the following tests or measurements:  Height, weight, and BMI.  Blood pressure.  Lipid and cholesterol levels. These may be checked every 5 years, or more frequently if you are over 79 years old.  Skin check.  Lung cancer screening. You may have this screening every year starting at age 40 if you have a 30-pack-year history of smoking and currently smoke or have quit within the past 15 years.  Fecal occult blood test (FOBT) of the stool. You may have this test every year starting at age 56.  Flexible sigmoidoscopy or colonoscopy. You may have a sigmoidoscopy every 5 years or a colonoscopy every 10 years starting at age 44.  Prostate cancer screening. Recommendations will vary depending on your family history and other risks.  Hepatitis C  blood test.  Hepatitis B blood test.  Sexually transmitted disease (STD) testing.  Diabetes screening. This is done by checking your blood sugar  (glucose) after you have not eaten for a while (fasting). You may have this done every 1-3 years. Discuss your test results, treatment options, and if necessary, the need for more tests with your health care provider. Vaccines  Your health care provider may recommend certain vaccines, such as:  Influenza vaccine. This is recommended every year.  Tetanus, diphtheria, and acellular pertussis (Tdap, Td) vaccine. You may need a Td booster every 10 years.  Zoster vaccine. You may need this after age 47.  Pneumococcal 13-valent conjugate (PCV13) vaccine. You may need this if you have certain conditions and have not been vaccinated.  Pneumococcal polysaccharide (PPSV23) vaccine. You may need one or two doses if you smoke cigarettes or if you have certain conditions. Talk to your health care provider about which screenings and vaccines you need and how often you need them. This information is not intended to replace advice given to you by your health care provider. Make sure you discuss any questions you have with your health care provider. Document Released: 08/20/2015 Document Revised: 04/12/2016 Document Reviewed: 05/25/2015 Elsevier Interactive Patient Education  2017 [place] Prevention in the Home Falls can cause injuries. They can happen to people of all ages. There are many things you can do to make your home safe and to help prevent falls. What can I do on the outside of my home?  Regularly fix the edges of walkways and driveways and fix any cracks.  Remove anything that might make you trip as you walk through a door, such as a raised step or threshold.  Trim any bushes or trees on the path to your home.  Use bright outdoor lighting.  Clear any walking paths of anything that might make someone trip, such as rocks or tools.  Regularly check to see if handrails are loose or broken. Make sure that both sides of any steps have handrails.  Any raised decks and porches  should have guardrails on the edges.  Have any leaves, snow, or ice cleared regularly.  Use sand or salt on walking paths during winter.  Clean up any spills in your garage right away. This includes oil or grease spills. What can I do in the bathroom?  Use night lights.  Install grab bars by the toilet and in the tub and shower. Do not use towel bars as grab bars.  Use non-skid mats or decals in the tub or shower.  If you need to sit down in the shower, use a plastic, non-slip stool.  Keep the floor dry. Clean up any water that spills on the floor as soon as it happens.  Remove soap buildup in the tub or shower regularly.  Attach bath mats securely with double-sided non-slip rug tape.  Do not have throw rugs and other things on the floor that can make you trip. What can I do in the bedroom?  Use night lights.  Make sure that you have a light by your bed that is easy to reach.  Do not use any sheets or blankets that are too big for your bed. They should not hang down onto the floor.  Have a firm chair that has side arms. You can use this for support while you get dressed.  Do not have throw rugs and other things on the floor that can make you trip. What  can I do in the kitchen?  Clean up any spills right away.  Avoid walking on wet floors.  Keep items that you use a lot in easy-to-reach places.  If you need to reach something above you, use a strong step stool that has a grab bar.  Keep electrical cords out of the way.  Do not use floor polish or wax that makes floors slippery. If you must use wax, use non-skid floor wax.  Do not have throw rugs and other things on the floor that can make you trip. What can I do with my stairs?  Do not leave any items on the stairs.  Make sure that there are handrails on both sides of the stairs and use them. Fix handrails that are broken or loose. Make sure that handrails are as long as the stairways.  Check any carpeting to  make sure that it is firmly attached to the stairs. Fix any carpet that is loose or worn.  Avoid having throw rugs at the top or bottom of the stairs. If you do have throw rugs, attach them to the floor with carpet tape.  Make sure that you have a light switch at the top of the stairs and the bottom of the stairs. If you do not have them, ask someone to add them for you. What else can I do to help prevent falls?  Wear shoes that:  Do not have high heels.  Have rubber bottoms.  Are comfortable and fit you well.  Are closed at the toe. Do not wear sandals.  If you use a stepladder:  Make sure that it is fully opened. Do not climb a closed stepladder.  Make sure that both sides of the stepladder are locked into place.  Ask someone to hold it for you, if possible.  Clearly mark and make sure that you can see:  Any grab bars or handrails.  First and last steps.  Where the edge of each step is.  Use tools that help you move around (mobility aids) if they are needed. These include:  Canes.  Walkers.  Scooters.  Crutches.  Turn on the lights when you go into a dark area. Replace any light bulbs as soon as they burn out.  Set up your furniture so you have a clear path. Avoid moving your furniture around.  If any of your floors are uneven, fix them.  If there are any pets around you, be aware of where they are.  Review your medicines with your doctor. Some medicines can make you feel dizzy. This can increase your chance of falling. Ask your doctor what other things that you can do to help prevent falls. This information is not intended to replace advice given to you by your health care provider. Make sure you discuss any questions you have with your health care provider. Document Released: 05/20/2009 Document Revised: 12/30/2015 Document Reviewed: 08/28/2014 Elsevier Interactive Patient Education  2017 [employer].

## 2020-09-24 NOTE — Progress Notes (Signed)
Subjective:   Rick Edwards is a 65 y.o. male who presents for Medicare Annual/Subsequent preventive examination.  Review of Systems    No ROS. Medicare Wellness Visit. Additional risk factors are reflected in social history. Cardiac Risk Factors include: advanced age (>43men, >14 women);diabetes mellitus;dyslipidemia;hypertension;family history of premature cardiovascular disease;male gender;obesity (BMI >30kg/m2)     Objective:    Today's Vitals   09/24/20 1305  BP: 118/80  Pulse: 72  Temp: 98.2 F (36.8 C)  SpO2: 96%  Weight: 226 lb (102.5 kg)  Height: 5\' 9"  (1.753 m)  PainSc: 0-No pain   Body mass index is 33.37 kg/m.  Advanced Directives 09/24/2020 08/12/2019 01/19/2016 03/31/2015 05/27/2013 05/18/2013 04/18/2013  Does Patient Have a Medical Advance Directive? Yes No No No Patient does not have advance directive Patient does not have advance directive;Patient has advance directive, copy not in chart Patient does not have advance directive  Type of Advance Directive Living will;Healthcare Power of Attorney - - - - - -  Does patient want to make changes to medical advance directive? No - Patient declined - - - - - -  Copy of [place] in Chart? No - copy requested - - - - - -  Would patient like information on creating a medical advance directive? - No - Patient declined No - patient declined information Yes - Educational materials given - - -  Pre-existing out of facility DNR order (yellow form or pink MOST form) - - - - - Other (comment) No    Current Medications (verified) Outpatient Encounter Medications as of 09/24/2020  Medication Sig  . allopurinol (ZYLOPRIM) 100 MG tablet TAKE 1 TABLET BY MOUTH EVERY DAY  . amLODipine (NORVASC) 10 MG tablet Take 10 mg by mouth at bedtime.  Marland Kitchen atorvastatin (LIPITOR) 40 MG tablet Take 1 tablet (40 mg total) by mouth daily.  . carvedilol (COREG) 25 MG tablet TAKE 1 TABLET (25 MG TOTAL) BY MOUTH 2 (TWO) TIMES DAILY  WITH A MEAL.  . chlorthalidone (HYGROTON) 25 MG tablet Take 25 mg by mouth every morning.  . Colchicine (MITIGARE) 0.6 MG CAPS Take 1 tablet by mouth daily.  . hyoscyamine (LEVSIN SL) 0.125 MG SL tablet Place under the tongue 2 (two) times daily as needed.  Marland Kitchen icosapent Ethyl (VASCEPA) 1 g capsule Take 2 capsules (2 g total) by mouth 2 (two) times daily.  . irbesartan (AVAPRO) 150 MG tablet Take 1 tablet (150 mg total) by mouth daily.  Marland Kitchen spironolactone (ALDACTONE) 25 MG tablet TAKE 1 TABLET BY MOUTH EVERY DAY  . warfarin (COUMADIN) 5 MG tablet TAKE 1.5 TABLETS DAILY, EXCEPT TAKE 1 TABLET ON MON, WED, AND FRI AS DIRECTED BY CLINIC.  . [DISCONTINUED] fenofibrate (TRICOR) 145 MG tablet Take 1 tablet by mouth once daily  . [DISCONTINUED] indapamide (LOZOL) 1.25 MG tablet Take 1 tablet (1.25 mg total) by mouth daily.   No facility-administered encounter medications on file as of 09/24/2020.    Allergies (verified) Mesalamine   History: Past Medical History:  Diagnosis Date  . Anemia    anemia thrombocytopenia, saw Hematology 2011, can not r/o myeloproliferative d/c  . Antithrombin III deficiency ([place]) 05/16/2013   On coumadin for this  . Blood clot in vein    in right leg  . Chronic kidney disease 05/27/2013   ACUTE RENAL FAILURE   . Crohn's disease ([place])    tx. Imuran  . GI bleed 6/11   w/ normal EGD 6/11, 3 PRBCs   .  Hearing loss    wears hearing aid left ear; birthed with hearing loss  . HOH (hard of hearing) 05/16/2013  . Hypercalcemia   . Hyperkalemia 05/27/2013  . Hypertension   . Ileostomy in place [facility]) 04-11-13   04-18-13 ileostomy to be taken down.  . Perianal abscess 2001   s/p right hemicolectomy, and drainage of retroperitoneal abcess 2003  . Peripheral vascular disease ([place])   . Transfusion history    last 8'13   Past Surgical History:  Procedure Laterality Date  . Anal fistulotomy  2002  . EMBOLECTOMY  03/12/2012   Procedure: EMBOLECTOMY;  Surgeon: Angelia Mould, MD;  Location: [facility] OR;  Service: Vascular;  Laterality: Right;  Right popliteal embolectomy with vein patch angioplasty, right posterior tibial embolectomy with vein patch angioplasty   . HEMICOLECTOMY  08/29/2001   perforated abscess  . ILEOSTOMY CLOSURE N/A 04/18/2013   Procedure: ILEOSTOMY TAKEDOWN;  Surgeon: Edward Jolly, MD;  Location: [facility] ORS;  Service: General;  Laterality: N/A;  . INTRAOPERATIVE ARTERIOGRAM  03/12/2012   Procedure: INTRA OPERATIVE ARTERIOGRAM;  Surgeon: Angelia Mould, MD;  Location: [facility];  Service: Vascular;  Laterality: Right;  . PARTIAL COLECTOMY  03/11/2012   Procedure: PARTIAL COLECTOMY;  Surgeon: Edward Jolly, MD;  Location: [facility] ORS;  Service: General;  Laterality: N/A;  subtotal colectomy transverse and left colon   . TRANSMETATARSAL AMPUTATION  05/10/2012   right   Family History  Problem Relation Age of Onset  . Hypertension Mother   . Hyperlipidemia Mother   . Hypertension Father   . Hyperlipidemia Father   . Heart disease Father   . Stroke Other        GF, aunts   . Hypertension Other        "the whole family"  . Coronary artery disease Neg Hx   . Diabetes Neg Hx   . Colon cancer Neg Hx   . Prostate cancer Neg Hx   . Kidney failure Neg Hx    Social History   Socioeconomic History  . Marital status: Single    Spouse name: Not on file  . Number of children: Not on file  . Years of education: Not on file  . Highest education level: Not on file  Occupational History  . Occupation: Post office   Tobacco Use  . Smoking status: Former Smoker    Packs/day: 0.25    Years: 9.00    Pack years: 2.25    Types: Cigarettes    Quit date: 05/09/2011    Years since quitting: 9.3  . Smokeless tobacco: Never Used  Substance and Sexual Activity  . Alcohol use: No    Alcohol/week: 0.0 standard drinks  . Drug use: Yes    Types: Marijuana  . Sexual activity: Not Currently  Other Topics Concern  . Not on file  Social History  Narrative   Lives by himself   Regular exercise- no          Social Determinants of Health   Financial Resource Strain: Low Risk   . Difficulty of Paying Living Expenses: Not hard at all  Food Insecurity: No Food Insecurity  . Worried About Charity fundraiser in the Last Year: Never true  . Ran Out of Food in the Last Year: Never true  Transportation Needs: No Transportation Needs  . Lack of Transportation (Medical): No  . Lack of Transportation (Non-Medical): No  Physical Activity: Inactive  . Days of Exercise per Week:  0 days  . Minutes of Exercise per Session: 0 min  Stress: No Stress Concern Present  . Feeling of Stress : Not at all  Social Connections: Moderately Integrated  . Frequency of Communication with Friends and Family: More than three times a week  . Frequency of Social Gatherings with Friends and Family: More than three times a week  . Attends Religious Services: More than 4 times per year  . Active Member of Clubs or Organizations: No  . Attends Archivist Meetings: More than 4 times per year  . Marital Status: Never married    Tobacco Counseling Counseling given: Not Answered   Clinical Intake:  Pre-visit preparation completed: Yes  Pain : No/denies pain Pain Score: 0-No pain     BMI - recorded: 33.37 Nutritional Status: BMI > 30  Obese Nutritional Risks: None Diabetes: Yes CBG done?: No Did pt. bring in CBG monitor from home?: No  How often do you need to have someone help you when you read instructions, pamphlets, or other written materials from your doctor or pharmacy?: 1 - Never What is the last grade level you completed in school?: HSG  Diabetic? yes  Interpreter Needed?: No  Information entered by :: Lisette Abu, LPN   Activities of Daily Living In your present state of health, do you have any difficulty performing the following activities: 09/24/2020  Hearing? Y  Comment deaf in left ear; wears hearing aid  Vision?  N  Difficulty concentrating or making decisions? N  Walking or climbing stairs? N  Dressing or bathing? N  Doing errands, shopping? N  Preparing Food and eating ? N  Using the Toilet? N  In the past six months, have you accidently leaked urine? N  Do you have problems with loss of bowel control? N  Managing your Medications? N  Managing your Finances? N  Housekeeping or managing your Housekeeping? N  Some recent data might be hidden    Patient Care Team: Janith Lima, MD as PCP - General (Internal Medicine) Excell Seltzer, MD (Inactive) (General Surgery) Michael Boston, MD as Consulting Physician (General Surgery)  Indicate any recent Medical Services you may have received from other than [facility] providers in the past year (date may be approximate).     Assessment:   This is a routine wellness examination for [place].  Hearing/Vision screen No exam data present  Dietary issues and exercise activities discussed: Current Exercise Habits: The patient does not participate in regular exercise at present, Exercise limited by: orthopedic condition(s)  Goals    . Patient Stated     To maintain my current health status by continuing to eat healthy, stay physically active and socially active.      Depression Screen PHQ 2/9 Scores 09/24/2020 05/05/2019 10/29/2017 01/19/2016 06/12/2013  PHQ - 2 Score 0 0 0 0 0    Fall Risk Fall Risk  09/24/2020 05/05/2019 08/15/2018 01/19/2016  Falls in the past year? 0 0 0 No  Number falls in past yr: 0 0 0 -  Injury with Fall? 0 0 0 -  Risk for fall due to : No Fall Risks - - -  Follow up - Falls evaluation completed Falls evaluation completed -    FALL RISK PREVENTION PERTAINING TO THE HOME:  Any stairs in or around the home? No  If so, are there any without handrails? No  Home free of loose throw rugs in walkways, pet beds, electrical cords, etc? Yes  Adequate lighting in your  home to reduce risk of falls? Yes   ASSISTIVE DEVICES UTILIZED  TO PREVENT FALLS:  Life alert? No  Use of a cane, walker or w/c? No  Grab bars in the bathroom? No  Shower chair or bench in shower? No  Elevated toilet seat or a handicapped toilet? No   TIMED UP AND GO:  Was the test performed? No .  Length of time to ambulate 10 feet: 0 sec.   Gait steady and fast without use of assistive device  Cognitive Function: Normal cognitive status assessed by direct observation by this Nurse Health Advisor. No abnormalities found.          Immunizations Immunization History  Administered Date(s) Administered  . Influenza Split 05/29/2011, 07/17/2012  . Influenza Whole 06/29/2009  . Influenza,inj,Quad PF,6+ Mos 05/19/2013, 06/03/2014, 05/18/2015, 04/05/2016, 05/23/2017, 04/22/2018, 05/05/2019, 06/01/2020  . Pneumococcal Conjugate-13 01/18/2016  . Pneumococcal Polysaccharide-23 05/19/2013, 08/15/2018  . Td 06/29/2009  . Tdap 08/15/2018    TDAP status: Up to date  Flu Vaccine status: Up to date  Pneumococcal vaccine status: Up to date  Covid-19 vaccine status: Completed vaccines  Qualifies for Shingles Vaccine? Yes   Zostavax completed No   Shingrix Completed?: No.    Education has been provided regarding the importance of this vaccine. Patient has been advised to call insurance company to determine out of pocket expense if they have not yet received this vaccine. Advised may also receive vaccine at local pharmacy or Health Dept. Verbalized acceptance and understanding.  Screening Tests Health Maintenance  Topic Date Due  . OPHTHALMOLOGY EXAM  Never done  . COVID-19 Vaccine (1) Never done  . HEMOGLOBIN A1C  11/30/2020  . FOOT EXAM  06/01/2021  . COLONOSCOPY (Pts 45-70yrs Insurance coverage will need to be confirmed)  02/15/2023  . TETANUS/TDAP  08/15/2028  . INFLUENZA VACCINE  Completed  . PNEUMOCOCCAL POLYSACCHARIDE VACCINE AGE 32-64 HIGH RISK  Completed  . Hepatitis C Screening  Completed  . HIV Screening  Completed    Health  Maintenance  Health Maintenance Due  Topic Date Due  . OPHTHALMOLOGY EXAM  Never done  . COVID-19 Vaccine (1) Never done    Colorectal cancer screening: Type of screening: Sigmoidoscopy. Completed 05/14/2018. Repeat every 10 years  Lung Cancer Screening: (Low Dose CT Chest recommended if Age 49-80 years, 30 pack-year currently smoking OR have quit w/in 15years.) does qualify.   Lung Cancer Screening Referral: no  Additional Screening:  Hepatitis C Screening: does not qualify; Completed no  Vision Screening: Recommended annual ophthalmology exams for early detection of glaucoma and other disorders of the eye. Is the patient up to date with their annual eye exam?  Yes  Who is the provider or what is the name of the office in which the patient attends annual eye exams? [facility] If pt is not established with a provider, would they like to be referred to a provider to establish care? No .   Dental Screening: Recommended annual dental exams for proper oral hygiene  Community Resource Referral / Chronic Care Management: CRR required this visit?  No   CCM required this visit?  No      Plan:     I have personally reviewed and noted the following in the patient's chart:   . Medical and social history . Use of alcohol, tobacco or illicit drugs  . Current medications and supplements . Functional ability and status . Nutritional status . Physical activity . Advanced directives . List of  other physicians . Hospitalizations, surgeries, and ER visits in previous 12 months . Vitals . Screenings to include cognitive, depression, and falls . Referrals and appointments  In addition, I have reviewed and discussed with patient certain preventive protocols, quality metrics, and best practice recommendations. A written personalized care plan for preventive services as well as general preventive health recommendations were provided to patient.     Sheral Flow,  LPN   [id number]   Nurse Notes:  Medications reviewed with patient; no opioid use noted.

## 2020-09-30 ENCOUNTER — Ambulatory Visit (INDEPENDENT_AMBULATORY_CARE_PROVIDER_SITE_OTHER): Payer: Medicare Other | Admitting: General Practice

## 2020-09-30 ENCOUNTER — Other Ambulatory Visit: Payer: Self-pay

## 2020-09-30 DIAGNOSIS — Z7901 Long term (current) use of anticoagulants: Secondary | ICD-10-CM

## 2020-09-30 DIAGNOSIS — I743 Embolism and thrombosis of arteries of the lower extremities: Secondary | ICD-10-CM

## 2020-09-30 LAB — POCT INR: INR: 1.9 — AB (ref 2.0–3.0)

## 2020-09-30 NOTE — Progress Notes (Signed)
I have reviewed and agree.

## 2020-09-30 NOTE — Patient Instructions (Signed)
Pre visit review using our clinic review tool, if applicable. No additional management support is needed unless otherwise documented below in the visit note.  Take 1 1/2 tablets today and th continue to take 1 tablet daily except take 1 1/2 tablets on Mon Wed and Fridays.  Re-check in 4 weeks.

## 2020-10-28 ENCOUNTER — Other Ambulatory Visit: Payer: Self-pay

## 2020-10-28 ENCOUNTER — Ambulatory Visit (INDEPENDENT_AMBULATORY_CARE_PROVIDER_SITE_OTHER): Payer: Medicare Other | Admitting: General Practice

## 2020-10-28 DIAGNOSIS — I743 Embolism and thrombosis of arteries of the lower extremities: Secondary | ICD-10-CM

## 2020-10-28 DIAGNOSIS — Z7901 Long term (current) use of anticoagulants: Secondary | ICD-10-CM

## 2020-10-28 LAB — POCT INR: INR: 1.6 — AB (ref 2.0–3.0)

## 2020-10-28 NOTE — Progress Notes (Signed)
I have reviewed and agree.

## 2020-10-28 NOTE — Patient Instructions (Signed)
Pre visit review using our clinic review tool, if applicable. No additional management support is needed unless otherwise documented below in the visit note.  Take 1 1/2 tablets today and take 2 tablets tomorrow (3/25) and then continue to take 1 tablet daily except take 1 1/2 tablets on Mon Wed and Fridays.  Re-check in 2 weeks.

## 2020-11-11 ENCOUNTER — Ambulatory Visit (INDEPENDENT_AMBULATORY_CARE_PROVIDER_SITE_OTHER): Payer: Medicare Other | Admitting: General Practice

## 2020-11-11 ENCOUNTER — Other Ambulatory Visit: Payer: Self-pay

## 2020-11-11 DIAGNOSIS — Z7901 Long term (current) use of anticoagulants: Secondary | ICD-10-CM

## 2020-11-11 DIAGNOSIS — I743 Embolism and thrombosis of arteries of the lower extremities: Secondary | ICD-10-CM

## 2020-11-11 LAB — POCT INR: INR: 1.5 — AB (ref 2.0–3.0)

## 2020-11-11 NOTE — Progress Notes (Signed)
I have reviewed and agree.

## 2020-11-11 NOTE — Patient Instructions (Signed)
.  Pre visit review using our clinic review tool, if applicable. No additional management support is needed unless otherwise documented below in the visit note.  Take 1 1/2 tablets today and take 2 tablets tomorrow (4/7) and then change dosage and take 1 1/2 tablets daily except 1 tablet on Monday and Fridays.  Re-check in 2 weeks.  Please consider using a pill box for medication.

## 2020-11-25 ENCOUNTER — Other Ambulatory Visit: Payer: Self-pay

## 2020-11-25 ENCOUNTER — Ambulatory Visit (INDEPENDENT_AMBULATORY_CARE_PROVIDER_SITE_OTHER): Payer: Medicare Other | Admitting: General Practice

## 2020-11-25 DIAGNOSIS — I743 Embolism and thrombosis of arteries of the lower extremities: Secondary | ICD-10-CM

## 2020-11-25 DIAGNOSIS — Z7901 Long term (current) use of anticoagulants: Secondary | ICD-10-CM

## 2020-11-25 LAB — POCT INR: INR: 1.5 — AB (ref 2.0–3.0)

## 2020-11-25 NOTE — Progress Notes (Signed)
I have reviewed and agree.

## 2020-11-25 NOTE — Patient Instructions (Addendum)
Pre visit review using our clinic review tool, if applicable. No additional management support is needed unless otherwise documented below in the visit note.  Take 2 tablets today and then change dosage and take 1 1/2 tablets daily.  Re-check in 2 weeks.

## 2020-12-09 ENCOUNTER — Ambulatory Visit (INDEPENDENT_AMBULATORY_CARE_PROVIDER_SITE_OTHER): Payer: Medicare Other | Admitting: General Practice

## 2020-12-09 ENCOUNTER — Other Ambulatory Visit: Payer: Self-pay

## 2020-12-09 DIAGNOSIS — I743 Embolism and thrombosis of arteries of the lower extremities: Secondary | ICD-10-CM

## 2020-12-09 DIAGNOSIS — Z7901 Long term (current) use of anticoagulants: Secondary | ICD-10-CM

## 2020-12-09 LAB — POCT INR: INR: 3 (ref 2.0–3.0)

## 2020-12-09 NOTE — Progress Notes (Signed)
I have reviewed and agree.

## 2020-12-09 NOTE — Patient Instructions (Addendum)
Pre visit review using our clinic review tool, if applicable. No additional management support is needed unless otherwise documented below in the visit note.  Skip dosage today and then continue to take 1 1/2 tablets daily.  Re-check in 4 weeks.

## 2020-12-11 ENCOUNTER — Other Ambulatory Visit: Payer: Self-pay | Admitting: Internal Medicine

## 2020-12-11 DIAGNOSIS — I10 Essential (primary) hypertension: Secondary | ICD-10-CM

## 2020-12-15 ENCOUNTER — Telehealth: Payer: Self-pay | Admitting: Internal Medicine

## 2020-12-15 DIAGNOSIS — I10 Essential (primary) hypertension: Secondary | ICD-10-CM

## 2020-12-15 NOTE — Telephone Encounter (Signed)
    Can patient have short supply called to pharmacy. While waiting for June appointment?

## 2020-12-16 NOTE — Telephone Encounter (Signed)
Denied.  Per last OV 06/01/20 AVS:  Follow-up: Return in about 6 months (around 11/30/2020).

## 2020-12-16 NOTE — Telephone Encounter (Signed)
Pt has been informed and expressed understanding.  

## 2020-12-19 ENCOUNTER — Other Ambulatory Visit: Payer: Self-pay | Admitting: Internal Medicine

## 2020-12-19 DIAGNOSIS — E785 Hyperlipidemia, unspecified: Secondary | ICD-10-CM

## 2021-01-04 ENCOUNTER — Other Ambulatory Visit: Payer: Self-pay | Admitting: Internal Medicine

## 2021-01-04 DIAGNOSIS — I10 Essential (primary) hypertension: Secondary | ICD-10-CM

## 2021-01-06 ENCOUNTER — Ambulatory Visit (INDEPENDENT_AMBULATORY_CARE_PROVIDER_SITE_OTHER): Payer: Medicare Other | Admitting: General Practice

## 2021-01-06 ENCOUNTER — Other Ambulatory Visit: Payer: Self-pay

## 2021-01-06 DIAGNOSIS — Z7901 Long term (current) use of anticoagulants: Secondary | ICD-10-CM | POA: Diagnosis not present

## 2021-01-06 DIAGNOSIS — I743 Embolism and thrombosis of arteries of the lower extremities: Secondary | ICD-10-CM

## 2021-01-06 LAB — POCT INR: INR: 3.2 — AB (ref 2.0–3.0)

## 2021-01-06 NOTE — Patient Instructions (Addendum)
Pre visit review using our clinic review tool, if applicable. No additional management support is needed unless otherwise documented below in the visit note.  Take 1 tablet today (5 mg) and then change dosage and take 1 1/2 daily except take 1 tablet on Wednesdays.  Re-check in 4 weeks.

## 2021-01-06 NOTE — Progress Notes (Signed)
I have reviewed and agree.

## 2021-01-18 ENCOUNTER — Encounter: Payer: Self-pay | Admitting: Internal Medicine

## 2021-01-18 ENCOUNTER — Ambulatory Visit (INDEPENDENT_AMBULATORY_CARE_PROVIDER_SITE_OTHER): Payer: Medicare Other | Admitting: Internal Medicine

## 2021-01-18 ENCOUNTER — Other Ambulatory Visit: Payer: Self-pay

## 2021-01-18 VITALS — BP 130/72 | HR 68 | Temp 98.3°F | Ht 69.0 in | Wt 226.0 lb

## 2021-01-18 DIAGNOSIS — B354 Tinea corporis: Secondary | ICD-10-CM | POA: Insufficient documentation

## 2021-01-18 DIAGNOSIS — E785 Hyperlipidemia, unspecified: Secondary | ICD-10-CM

## 2021-01-18 DIAGNOSIS — I743 Embolism and thrombosis of arteries of the lower extremities: Secondary | ICD-10-CM | POA: Diagnosis not present

## 2021-01-18 DIAGNOSIS — I10 Essential (primary) hypertension: Secondary | ICD-10-CM

## 2021-01-18 DIAGNOSIS — M1A39X0 Chronic gout due to renal impairment, multiple sites, without tophus (tophi): Secondary | ICD-10-CM | POA: Diagnosis not present

## 2021-01-18 DIAGNOSIS — I700 Atherosclerosis of aorta: Secondary | ICD-10-CM | POA: Diagnosis not present

## 2021-01-18 DIAGNOSIS — E781 Pure hyperglyceridemia: Secondary | ICD-10-CM | POA: Diagnosis not present

## 2021-01-18 DIAGNOSIS — E118 Type 2 diabetes mellitus with unspecified complications: Secondary | ICD-10-CM | POA: Diagnosis not present

## 2021-01-18 DIAGNOSIS — E876 Hypokalemia: Secondary | ICD-10-CM

## 2021-01-18 DIAGNOSIS — N1831 Chronic kidney disease, stage 3a: Secondary | ICD-10-CM

## 2021-01-18 DIAGNOSIS — R808 Other proteinuria: Secondary | ICD-10-CM

## 2021-01-18 DIAGNOSIS — L280 Lichen simplex chronicus: Secondary | ICD-10-CM | POA: Diagnosis not present

## 2021-01-18 LAB — CBC WITH DIFFERENTIAL/PLATELET
Basophils Absolute: 0.1 10*3/uL (ref 0.0–0.1)
Basophils Relative: 1.1 % (ref 0.0–3.0)
Eosinophils Absolute: 0.2 10*3/uL (ref 0.0–0.7)
Eosinophils Relative: 2.9 % (ref 0.0–5.0)
HCT: 44.6 % (ref 39.0–52.0)
Hemoglobin: 15.3 g/dL (ref 13.0–17.0)
Lymphocytes Relative: 28.5 % (ref 12.0–46.0)
Lymphs Abs: 1.9 10*3/uL (ref 0.7–4.0)
MCHC: 34.3 g/dL (ref 30.0–36.0)
MCV: 89.4 fl (ref 78.0–100.0)
Monocytes Absolute: 0.8 10*3/uL (ref 0.1–1.0)
Monocytes Relative: 11.5 % (ref 3.0–12.0)
Neutro Abs: 3.8 10*3/uL (ref 1.4–7.7)
Neutrophils Relative %: 56 % (ref 43.0–77.0)
Platelets: 377 10*3/uL (ref 150.0–400.0)
RBC: 4.98 Mil/uL (ref 4.22–5.81)
RDW: 14.4 % (ref 11.5–15.5)
WBC: 6.8 10*3/uL (ref 4.0–10.5)

## 2021-01-18 LAB — URINALYSIS, ROUTINE W REFLEX MICROSCOPIC
Bilirubin Urine: NEGATIVE
Hgb urine dipstick: NEGATIVE
Ketones, ur: NEGATIVE
Leukocytes,Ua: NEGATIVE
Nitrite: NEGATIVE
Specific Gravity, Urine: 1.01 (ref 1.000–1.030)
Urine Glucose: NEGATIVE
Urobilinogen, UA: 0.2 (ref 0.0–1.0)
pH: 7 (ref 5.0–8.0)

## 2021-01-18 LAB — LIPID PANEL
Cholesterol: 142 mg/dL (ref 0–200)
HDL: 33.1 mg/dL — ABNORMAL LOW (ref >39.00)
LDL Cholesterol: 80 mg/dL (ref 0–99)
NonHDL: 108.84
Total CHOL/HDL Ratio: 4
Triglycerides: 142 mg/dL (ref 0.0–149.0)
VLDL: 28.4 mg/dL (ref 0.0–40.0)

## 2021-01-18 LAB — BASIC METABOLIC PANEL
BUN: 14 mg/dL (ref 6–23)
CO2: 32 mEq/L (ref 19–32)
Calcium: 9.2 mg/dL (ref 8.4–10.5)
Chloride: 97 mEq/L (ref 96–112)
Creatinine, Ser: 0.99 mg/dL (ref 0.40–1.50)
GFR: 81.59 mL/min (ref >60.00)
Glucose, Bld: 95 mg/dL (ref 70–99)
Potassium: 2.9 mEq/L — ABNORMAL LOW (ref 3.5–5.1)
Sodium: 137 mEq/L (ref 135–145)

## 2021-01-18 LAB — HEPATIC FUNCTION PANEL
ALT: 22 U/L (ref 0–53)
AST: 15 U/L (ref 0–37)
Albumin: 4.2 g/dL (ref 3.5–5.2)
Alkaline Phosphatase: 83 U/L (ref 39–117)
Bilirubin, Direct: 0.1 mg/dL (ref 0.0–0.3)
Total Bilirubin: 0.9 mg/dL (ref 0.2–1.2)
Total Protein: 7.3 g/dL (ref 6.0–8.3)

## 2021-01-18 LAB — HEMOGLOBIN A1C: Hgb A1c MFr Bld: 6 % (ref 4.6–6.5)

## 2021-01-18 LAB — TSH: TSH: 1.48 u[IU]/mL (ref 0.35–4.50)

## 2021-01-18 LAB — URIC ACID: Uric Acid, Serum: 11 mg/dL — ABNORMAL HIGH (ref 4.0–7.8)

## 2021-01-18 MED ORDER — SPIRONOLACTONE 25 MG PO TABS: 1.0000 | ORAL_TABLET | Freq: Every day | ORAL | 0 refills | Status: DC

## 2021-01-18 MED ORDER — FLUCONAZOLE 150 MG PO TABS: 150.0000 mg | ORAL_TABLET | Freq: Every day | ORAL | 0 refills | Status: DC

## 2021-01-18 MED ORDER — ALLOPURINOL 300 MG PO TABS: 300.0000 mg | ORAL_TABLET | Freq: Every day | ORAL | 0 refills | Status: DC

## 2021-01-18 MED ORDER — BETAMETHASONE DIPROPIONATE AUG 0.05 % EX CREA: TOPICAL_CREAM | Freq: Two times a day (BID) | CUTANEOUS | 1 refills | Status: DC

## 2021-01-18 NOTE — Progress Notes (Signed)
Subjective:  Patient ID: Rick Edwards, male    DOB: March 30, 1956  Age: 65 y.o. MRN: [id number]  CC: Rash, Hyperlipidemia, Diabetes, and Hypertension  This visit occurred during the SARS-CoV-2 public health emergency.  Safety protocols were in place, including screening questions prior to the visit, additional usage of staff PPE, and extensive cleaning of exam room while observing appropriate contact time as indicated for disinfecting solutions.    HPI Rick Edwards presents for f/up -  He complains of a 1 month hx of rash in both axillae. He has been using an OTC steroid cream without much improvement. He is active and denies CP, DOE, palpitations, edema, or fatigue.  Outpatient Medications Prior to Visit  Medication Sig Dispense Refill   amLODipine (NORVASC) 10 MG tablet Take 10 mg by mouth at bedtime.     atorvastatin (LIPITOR) 40 MG tablet TAKE 1 TABLET BY MOUTH EVERY DAY 90 tablet 1   carvedilol (COREG) 25 MG tablet Take 1 tablet (25 mg total) by mouth 2 (two) times daily with a meal. 60 tablet 0   hyoscyamine (LEVSIN SL) 0.125 MG SL tablet Place under the tongue 2 (two) times daily as needed.     icosapent Ethyl (VASCEPA) 1 g capsule Take 2 capsules (2 g total) by mouth 2 (two) times daily. 360 capsule 1   warfarin (COUMADIN) 5 MG tablet TAKE 1.5 TABLETS DAILY, EXCEPT TAKE 1 TABLET ON MON, WED, AND FRI AS DIRECTED BY CLINIC. 120 tablet 1   allopurinol (ZYLOPRIM) 100 MG tablet TAKE 1 TABLET BY MOUTH EVERY DAY 90 tablet 1   chlorthalidone (HYGROTON) 25 MG tablet Take 25 mg by mouth every morning.     spironolactone (ALDACTONE) 25 MG tablet TAKE 1 TABLET BY MOUTH EVERY DAY 90 tablet 0   Colchicine (MITIGARE) 0.6 MG CAPS Take 1 tablet by mouth daily. (Patient not taking: Reported on 01/18/2021) 90 capsule 1   irbesartan (AVAPRO) 150 MG tablet Take 1 tablet (150 mg total) by mouth daily. 90 tablet 1   No facility-administered medications prior to visit.    ROS Review of  Systems  Constitutional: Negative.  Negative for chills, diaphoresis, fatigue and fever.  HENT: Negative.    Eyes: Negative.   Cardiovascular:  Negative for chest pain, palpitations and leg swelling.  Gastrointestinal:  Negative for abdominal pain, constipation, diarrhea, nausea and vomiting.  Endocrine: Negative.   Genitourinary: Negative.  Negative for difficulty urinating, hematuria, scrotal swelling, testicular pain and urgency.  Musculoskeletal: Negative.  Negative for arthralgias and myalgias.  Skin:  Positive for rash. Negative for color change.  Neurological: Negative.  Negative for dizziness, weakness, light-headedness, numbness and headaches.  Hematological:  Negative for adenopathy. Does not bruise/bleed easily.  Psychiatric/Behavioral: Negative.     Objective:  BP 130/72 (BP Location: Left Arm, Patient Position: Sitting, Cuff Size: Large)   Pulse 68   Temp 98.3 F (36.8 C) (Oral)   Ht 5\' 9"  (1.753 m)   Wt 226 lb (102.5 kg)   SpO2 97%   BMI 33.37 kg/m   BP Readings from Last 3 Encounters:  01/18/21 130/72  09/24/20 118/80  06/01/20 136/86    Wt Readings from Last 3 Encounters:  01/18/21 226 lb (102.5 kg)  09/24/20 226 lb (102.5 kg)  06/01/20 232 lb (105.2 kg)    Physical Exam Constitutional:      Appearance: Normal appearance.  HENT:     Nose: Nose normal.     Mouth/Throat:  Mouth: Mucous membranes are moist.  Eyes:     General: No scleral icterus.    Conjunctiva/sclera: Conjunctivae normal.  Cardiovascular:     Rate and Rhythm: Normal rate and regular rhythm.     Heart sounds: No murmur heard.    Comments: EKG- NSR, 64 bpm LAFB NS ST/T wave changes - unchanged compared to the prior EKG No LVH or Q waves Pulmonary:     Effort: Pulmonary effort is normal. No respiratory distress.     Breath sounds: No stridor. No wheezing, rhonchi or rales.  Abdominal:     General: Abdomen is protuberant. Bowel sounds are normal. There is no distension.      Palpations: Abdomen is soft. There is no hepatomegaly, splenomegaly or mass.     Tenderness: There is no abdominal tenderness.  Genitourinary:    Pubic Area: No rash.      Penis: Normal and circumcised.      Testes: Normal.     Epididymis:     Right: Normal.     Left: Normal.     Prostate: Enlarged. Not tender and no nodules present.     Rectum: Guaiac result positive. No mass, tenderness, anal fissure, external hemorrhoid or internal hemorrhoid. Normal anal tone.  Musculoskeletal:        General: Normal range of motion.     Cervical back: Neck supple.     Right lower leg: No edema.     Left lower leg: No edema.  Lymphadenopathy:     Cervical: No cervical adenopathy.  Skin:    General: Skin is warm and dry.     Findings: Rash present.     Comments: Symmetrically in both axillae there is confluent pale thickened skin   Neurological:     General: No focal deficit present.     Mental Status: He is alert.  Psychiatric:        Mood and Affect: Mood normal.        Behavior: Behavior normal.    Lab Results  Component Value Date   WBC 6.8 01/18/2021   HGB 15.3 01/18/2021   HCT 44.6 01/18/2021   PLT 377.0 01/18/2021   GLUCOSE 95 01/18/2021   CHOL 142 01/18/2021   TRIG 142.0 01/18/2021   HDL 33.10 (L) 01/18/2021   LDLDIRECT 97.0 05/05/2019   LDLCALC 80 01/18/2021   ALT 22 01/18/2021   AST 15 01/18/2021   NA 137 01/18/2021   K 2.9 (L) 01/18/2021   CL 97 01/18/2021   CREATININE 0.99 01/18/2021   BUN 14 01/18/2021   CO2 32 01/18/2021   TSH 1.48 01/18/2021   PSA 1.5 11/27/2019   INR 3.2 (A) 01/06/2021   HGBA1C 6.0 01/18/2021   MICROALBUR 10.4 (H) 06/01/2020    No results found.  Assessment & Plan:   Rick Edwards was seen today for rash, hyperlipidemia, diabetes and hypertension.  Diagnoses and all orders for this visit:  Essential hypertension, malignant- His BP is well controlled. -     Basic metabolic panel; Future -     CBC with Differential/Platelet; Future -      EKG 12-Lead -     CBC with Differential/Platelet -     Basic metabolic panel -     spironolactone (ALDACTONE) 25 MG tablet; Take 1 tablet (25 mg total) by mouth daily.  Type II diabetes mellitus with manifestations ([place])- His blood sugar is well controlled. -     Basic metabolic panel; Future -     Hemoglobin A1c;  Future -     Hemoglobin A1c -     Basic metabolic panel  Stage 3a chronic kidney disease ([place])- His renal function is stable. He avoids nephrotoxic agents. -     Basic metabolic panel; Future -     Urinalysis, Routine w reflex microscopic; Future -     CBC with Differential/Platelet; Future -     CBC with Differential/Platelet -     Urinalysis, Routine w reflex microscopic -     Basic metabolic panel  Chronic gout due to renal impairment of multiple sites without tophus- His uric acid level is 11. Will increase the allopurinol dose. -     Uric acid; Future -     Uric acid -     allopurinol (ZYLOPRIM) 300 MG tablet; Take 1 tablet (300 mg total) by mouth daily.  Hyperlipidemia with target LDL less than 70- LDL goal achieved. Doing well on the statin  -     Lipid panel; Future -     TSH; Future -     Hepatic function panel; Future -     Hepatic function panel -     TSH -     Lipid panel  Other proteinuria- This has improved. -     Urinalysis, Routine w reflex microscopic; Future -     Urinalysis, Routine w reflex microscopic  Pure hypertriglyceridemia- His trigs are normal now. -     Lipid panel; Future -     Lipid panel  LSC (lichen simplex chronicus) -     augmented betamethasone dipropionate (DIPROLENE-AF) 0.05 % cream; Apply topically 2 (two) times daily.  Tinea corporis -     fluconazole (DIFLUCAN) 150 MG tablet; Take 1 tablet (150 mg total) by mouth daily for 10 days.  Chronic hypokalemia -     spironolactone (ALDACTONE) 25 MG tablet; Take 1 tablet (25 mg total) by mouth daily.  Atherosclerosis of aorta ([place])- Risk factor modifications addressed.  I  have discontinued [place] "Reggie"'s irbesartan, chlorthalidone, Colchicine, and allopurinol. I have also changed his spironolactone. Additionally, I am having him start on augmented betamethasone dipropionate, fluconazole, and allopurinol. Lastly, I am having him maintain his icosapent Ethyl, amLODipine, hyoscyamine, warfarin, atorvastatin, and carvedilol.  Meds ordered this encounter  Medications   augmented betamethasone dipropionate (DIPROLENE-AF) 0.05 % cream    Sig: Apply topically 2 (two) times daily.    Dispense:  50 g    Refill:  1   fluconazole (DIFLUCAN) 150 MG tablet    Sig: Take 1 tablet (150 mg total) by mouth daily for 10 days.    Dispense:  10 tablet    Refill:  0   spironolactone (ALDACTONE) 25 MG tablet    Sig: Take 1 tablet (25 mg total) by mouth daily.    Dispense:  90 tablet    Refill:  0   allopurinol (ZYLOPRIM) 300 MG tablet    Sig: Take 1 tablet (300 mg total) by mouth daily.    Dispense:  90 tablet    Refill:  0      Follow-up: Return in about 6 weeks (around 03/01/2021).  Scarlette Calico, MD

## 2021-01-18 NOTE — Patient Instructions (Signed)
Body Ringworm Body ringworm is an infection of the skin that often causes a ring-shaped rash.Body ringworm is also called tinea corporis. Body ringworm can affect any part of your skin. This condition is easily spread from person to person (is very contagious). What are the causes? This condition is caused by fungi called dermatophytes. The condition developswhen these fungi grow out of control on the skin. You can get this condition if you touch a person or animal that has it. You can also get it if you share any items with an infected person or pet. These include: Clothing, bedding, and towels. Brushes or combs. Gym equipment. Any other object that has the fungus on it. What increases the risk? You are more likely to develop this condition if you: Play sports that involve close physical contact, such as wrestling. Sweat a lot. Live in areas that are hot and humid. Use public showers. Have a weakened immune system. What are the signs or symptoms? Symptoms of this condition include: Itchy, raised red spots and bumps. Red scaly patches. A ring-shaped rash. The rash may have: A clear center. Scales or red bumps at its center. Redness near its borders. Dry and scaly skin on or around it. How is this diagnosed? This condition can usually be diagnosed with a skin exam. A skin scraping may be taken from the affected area and examined under a microscope to see if thefungus is present. How is this treated? This condition may be treated with: An antifungal cream or ointment. An antifungal shampoo. Antifungal medicines. These may be prescribed if your ringworm: Is severe. Keeps coming back. Lasts a long time. Follow these instructions at home: Take over-the-counter and prescription medicines only as told by your health care provider. If you were given an antifungal cream or ointment: Use it as told by your health care provider. Wash the infected area and dry it completely before  applying the cream or ointment. If you were given an antifungal shampoo: Use it as told by your health care provider. Leave the shampoo on your body for 3-5 minutes before rinsing. While you have a rash: Wear loose clothing to stop clothes from rubbing and irritating it. Wash or change your bed sheets every night. Disinfect or throw out items that may be infected. Wash clothes and bed sheets in hot water. Wash your hands often with soap and water. If soap and water are not available, use hand sanitizer. If your pet has the same infection, take your pet to see a veterinarian for treatment. How is this prevented? Take a bath or shower every day and after every time you work out or play sports. Dry your skin completely after bathing. Wear sandals or shoes in public places and showers. Change your clothes every day. Wash athletic clothes after each use. Do not share personal items with others. Avoid touching red patches of skin on other people. Avoid touching pets that have bald spots. If you touch an animal that has a bald spot, wash your hands. Contact a health care provider if: Your rash continues to spread after 7 days of treatment. Your rash is not gone in 4 weeks. The area around your rash gets red, warm, tender, and swollen. Summary Body ringworm is an infection of the skin that often causes a ring-shaped rash. This condition is easily spread from person to person (is very contagious). This condition may be treated with antifungal cream or ointment, antifungal shampoo, or antifungal medicines. Take over-the-counter and prescription medicines only  as told by your health care provider. This information is not intended to replace advice given to you by your health care provider. Make sure you discuss any questions you have with your healthcare provider. Document Revised: 03/22/2018 Document Reviewed: 03/22/2018 Elsevier Patient Education  [place].

## 2021-01-19 DIAGNOSIS — E876 Hypokalemia: Secondary | ICD-10-CM | POA: Insufficient documentation

## 2021-01-20 ENCOUNTER — Encounter: Payer: Self-pay | Admitting: Internal Medicine

## 2021-01-24 ENCOUNTER — Other Ambulatory Visit: Payer: Self-pay

## 2021-01-24 ENCOUNTER — Encounter: Payer: Self-pay | Admitting: Internal Medicine

## 2021-01-24 ENCOUNTER — Ambulatory Visit (INDEPENDENT_AMBULATORY_CARE_PROVIDER_SITE_OTHER): Payer: Medicare Other | Admitting: Internal Medicine

## 2021-01-24 ENCOUNTER — Telehealth: Payer: Self-pay | Admitting: Internal Medicine

## 2021-01-24 VITALS — BP 144/80 | HR 75 | Temp 99.0°F | Ht 69.0 in | Wt 224.0 lb

## 2021-01-24 DIAGNOSIS — I743 Embolism and thrombosis of arteries of the lower extremities: Secondary | ICD-10-CM | POA: Diagnosis not present

## 2021-01-24 DIAGNOSIS — L500 Allergic urticaria: Secondary | ICD-10-CM | POA: Diagnosis not present

## 2021-01-24 DIAGNOSIS — M1A39X0 Chronic gout due to renal impairment, multiple sites, without tophus (tophi): Secondary | ICD-10-CM

## 2021-01-24 DIAGNOSIS — T50905A Adverse effect of unspecified drugs, medicaments and biological substances, initial encounter: Secondary | ICD-10-CM

## 2021-01-24 DIAGNOSIS — L280 Lichen simplex chronicus: Secondary | ICD-10-CM

## 2021-01-24 DIAGNOSIS — L2084 Intrinsic (allergic) eczema: Secondary | ICD-10-CM | POA: Diagnosis not present

## 2021-01-24 MED ORDER — METHYLPREDNISOLONE ACETATE 80 MG/ML IJ SUSP
120.0000 mg | Freq: Once | INTRAMUSCULAR | Status: AC
  Administered 2021-01-24: 120 mg via INTRAMUSCULAR

## 2021-01-24 MED ORDER — LEVOCETIRIZINE DIHYDROCHLORIDE 5 MG PO TABS: 5.0000 mg | ORAL_TABLET | Freq: Every evening | ORAL | 1 refills | Status: DC

## 2021-01-24 MED ORDER — BETAMETHASONE DIPROPIONATE AUG 0.05 % EX CREA: TOPICAL_CREAM | Freq: Two times a day (BID) | CUTANEOUS | 1 refills | Status: DC

## 2021-01-24 NOTE — Telephone Encounter (Signed)
LVM to schedule an OV.

## 2021-01-24 NOTE — Telephone Encounter (Signed)
Patient is having a reaction to the new medications:  allopurinol (ZYLOPRIM) 300 MG tablet,  fluconazole (DIFLUCAN) 150 MG tablet  States he has rash/welps on his body that are very itchy. D/C the medication yesterday and took a benadryl. Still having the itchiness.    Please advise.   Best contact #: [phone]

## 2021-01-24 NOTE — Patient Instructions (Signed)
Drug Allergy A drug allergy happens when the body's disease-fighting system (immune system) reacts badly to a medicine. Drug allergies range from mild to severe. Some allergic reactions occur one week or more after you are exposed to a medicine (delayed reaction). A sudden (acute), severe allergic reaction that affects multiple areas of the body is called an anaphylactic reaction (anaphylaxis). Anaphylaxis can be life-threatening. All allergic reactions to a medicinerequire medical evaluation, even if the allergic reaction appears to be mild. What are the causes? This condition is caused by the immune system wrongly identifying a medicine as being harmful. When this happens, the body releases proteins (antibodies) and other compounds, such as histamine, into the bloodstream. This causes swelling in certain tissues and reduces blood flow to important areas, such asthe heart and lungs. Almost any medicine can cause an allergic reaction. Medicines that commonly cause allergic reactions (are common allergens) include: Penicillin. Sulfa medicines (sulfonamides). Medicines that numb certain areas of the body (local anesthetics). X-ray dyes that contain iodine. What are the signs or symptoms? Common symptoms of a mild allergic reaction include: Nasal congestion. Tingling in the mouth. An itchy, red rash. Common symptoms of a severe allergic reaction include: Swelling of the eyes, lips, face, or tongue. Swelling of the back of the mouth and the throat. Wheezing. A hoarse voice. Itchy, red, swollen areas of skin (hives). Dizziness or light-headedness. Fainting. Anxiety or confusion. Abdominal pain. Difficulty breathing, speaking, or swallowing. Chest tightness. Fast or irregular heartbeats (palpitations). Vomiting. Diarrhea. How is this diagnosed? This condition is diagnosed based on a physical exam and your history of recent exposure to one or more medicines. You may be referred for follow-up  testing by a health care provider who specializes in allergies. This testing can confirm the diagnosis of a drug allergy and determine which medicines you are allergic to. Testing may include: Skin tests. These may involve: Injecting a small amount of the possible allergen between layers of your skin (intradermal injection). Applying patches to your skin. Blood tests. Drug challenge. For this test, a health care provider gives you a small amount of a medicine in gradual doses while watching for an allergic reaction. If you are unsure of what caused your allergic reaction, your health care provider may ask you for: Information about all medicines that you take on a regular basis. The date and time of your reaction. How is this treated? There is no cure for allergies. However, an allergic reaction can be treated with: Medicines that help: Reduce pain and swelling (NSAIDs). Relieve itching and hives (antihistamines). Reduce swelling (corticosteroids). Respiratory inhalers. These are inhaled medicines that help open (dilate) the airways in your lungs. Injections of medicine that helps to relax the muscles in your airways and tighten your blood vessels (epinephrine). Severe allergic reactions, such as anaphylaxis, require immediate treatment in a hospital. You may need to be hospitalized for observation. You may also be prescribed rescue medicines, such as epinephrine. Epinephrine comes in many forms, including what is commonly called an auto-injector "pen" (pre-filled automatic epinephrine injection device). Follow these instructions at home: If you have a severe allergy  Always keep an auto-injector pen or your anaphylaxis kit near you. These can be lifesaving if you have a severe reaction. Use your auto-injector pen or anaphylaxis kit as told by your health care provider. Make sure that you, the members of your household, and your employer know: How to use an anaphylaxis kit. How to use an  auto-injector pen to give you an  epinephrine injection. Replace your epinephrine immediately after you use your auto-injector pen, in case you have another reaction. Wear a medical alert bracelet or necklace that states your drug allergy, if told by your health care provider.  General instructions Avoid medicines that you are allergic to. Take over-the-counter and prescription medicines only as told by your health care provider. If you were given medicines to treat your reaction, do not drive until your health care provider approves. If you have hives or a rash: Use an over-the-counter antihistamine as told by your health care provider. Apply cold, wet cloths (cold compresses) to your skin or take baths or showers in cool water. Avoid hot water. If you had tests done, it is up to you to get your test results. Ask your health care provider when your results will be ready. Tell any health care providers who care for you that you have a drug allergy. Keep all follow-up visits as told by your health care provider. This is important. Contact a health care provider if: You think that you are having a mild allergic reaction. Symptoms of an allergic reaction usually start within 30 minutes after you are exposed to a medicine. You have symptoms that last more than 2 days after your reaction. Your symptoms get worse. You develop new symptoms. Get help right away if: You needed to use epinephrine. An epinephrine injection helps to manage life-threatening allergic reactions, but you still need to go to the emergency room even if epinephrine seems to work. This is important because anaphylaxis may happen again within 72 hours (rebound anaphylaxis). If you used epinephrine to treat anaphylaxis outside of the hospital, you need additional medical care. This may include more doses of epinephrine. You develop symptoms of a severe allergic reaction. These symptoms may represent a serious problem that is an  emergency. Do not wait to see if the symptoms will go away. Use your auto-injector pen or anaphylaxis kit as you have been instructed, and get medical help right away. Call your local emergency services (911 in the [place] Do not drive yourself to the hospital. Summary A drug allergy happens when the body's disease-fighting system reacts badly to a medicine. Drug allergies range from mild to severe. In some cases, an allergic reaction may be life-threatening. If you have a severe allergy, always keep an auto-injector pen or your anaphylaxis kit near you. This information is not intended to replace advice given to you by your health care provider. Make sure you discuss any questions you have with your healthcare provider. Document Revised: 02/06/2018 Document Reviewed: 02/06/2018 Elsevier Patient Education  [place].

## 2021-01-24 NOTE — Progress Notes (Signed)
Acute Office Visit  Subjective:    Patient ID: Rick Edwards, male    DOB: Jul 14, 1956, 65 y.o.   MRN: [id number]  Chief Complaint  Patient presents with   Medication Reaction    HPI Patient is in today for a rash - He complains of a 3-day history of itchy rash.  He has an area over his right posterior axilla and an area over the left calf.  The only new esposures are that his allopurinol dose was recently increased but it was not a new start.  He has been taking fluconazole for tinea in the axillary regions.  He denies cough, wheezing, facial swelling, shortness of breath, dizziness, or diaphoresis.  Past Medical History:  Diagnosis Date   Anemia    anemia thrombocytopenia, saw Hematology 2011, can not r/o myeloproliferative d/c   Antithrombin III deficiency ([place]) 05/16/2013   On coumadin for this   Blood clot in vein    in right leg   Chronic kidney disease 05/27/2013   ACUTE RENAL FAILURE    Crohn's disease ([place])    tx. Imuran   GI bleed 6/11   w/ normal EGD 6/11, 3 PRBCs    Hearing loss    wears hearing aid left ear; birthed with hearing loss   HOH (hard of hearing) 05/16/2013   Hypercalcemia    Hyperkalemia 05/27/2013   Hypertension    Ileostomy in place [facility]) 04-11-13   04-18-13 ileostomy to be taken down.   Perianal abscess 2001   s/p right hemicolectomy, and drainage of retroperitoneal abcess 2003   Peripheral vascular disease ([place])    Transfusion history    last 8'13    Past Surgical History:  Procedure Laterality Date   Anal fistulotomy  2002   EMBOLECTOMY  03/12/2012   Procedure: EMBOLECTOMY;  Surgeon: Angelia Mould, MD;  Location: [place];  Service: Vascular;  Laterality: Right;  Right popliteal embolectomy with vein patch angioplasty, right posterior tibial embolectomy with vein patch angioplasty    HEMICOLECTOMY  08/29/2001   perforated abscess   ILEOSTOMY CLOSURE N/A 04/18/2013   Procedure: ILEOSTOMY TAKEDOWN;  Surgeon: Edward Jolly,  MD;  Location: [facility];  Service: General;  Laterality: N/A;   INTRAOPERATIVE ARTERIOGRAM  03/12/2012   Procedure: INTRA OPERATIVE ARTERIOGRAM;  Surgeon: Angelia Mould, MD;  Location: [place];  Service: Vascular;  Laterality: Right;   PARTIAL COLECTOMY  03/11/2012   Procedure: PARTIAL COLECTOMY;  Surgeon: Edward Jolly, MD;  Location: [facility];  Service: General;  Laterality: N/A;  subtotal colectomy transverse and left colon    TRANSMETATARSAL AMPUTATION  05/10/2012   right    Family History  Problem Relation Age of Onset   Hypertension Mother    Hyperlipidemia Mother    Hypertension Father    Hyperlipidemia Father    Heart disease Father    Stroke Other        GF, aunts    Hypertension Other        "the whole family"   Coronary artery disease Neg Hx    Diabetes Neg Hx    Colon cancer Neg Hx    Prostate cancer Neg Hx    Kidney failure Neg Hx     Social History   Socioeconomic History   Marital status: Single    Spouse name: Not on file   Number of children: Not on file   Years of education: Not on file   Highest education level: Not  on file  Occupational History   Occupation: Post office   Tobacco Use   Smoking status: Former    Packs/day: 0.25    Years: 9.00    Pack years: 2.25    Types: Cigarettes    Quit date: 05/09/2011    Years since quitting: 9.7   Smokeless tobacco: Never  Substance and Sexual Activity   Alcohol use: No    Alcohol/week: 0.0 standard drinks   Drug use: Yes    Types: Marijuana   Sexual activity: Not Currently  Other Topics Concern   Not on file  Social History Narrative   Lives by himself   Regular exercise- no          Social Determinants of Health   Financial Resource Strain: Low Risk    Difficulty of Paying Living Expenses: Not hard at all  Food Insecurity: No Food Insecurity   Worried About Charity fundraiser in the Last Year: Never true   [place] in the Last Year: Never true  Transportation Needs: No  Transportation Needs   Lack of Transportation (Medical): No   Lack of Transportation (Non-Medical): No  Physical Activity: Inactive   Days of Exercise per Week: 0 days   Minutes of Exercise per Session: 0 min  Stress: No Stress Concern Present   Feeling of Stress : Not at all  Social Connections: Moderately Integrated   Frequency of Communication with Friends and Family: More than three times a week   Frequency of Social Gatherings with Friends and Family: More than three times a week   Attends Religious Services: More than 4 times per year   Active Member of Clubs or Organizations: No   Attends Music therapist: More than 4 times per year   Marital Status: Never married  Human resources officer Violence: Not on file    Outpatient Medications Prior to Visit  Medication Sig Dispense Refill   allopurinol (ZYLOPRIM) 300 MG tablet Take 1 tablet (300 mg total) by mouth daily. 90 tablet 0   amLODipine (NORVASC) 10 MG tablet Take 10 mg by mouth at bedtime.     atorvastatin (LIPITOR) 40 MG tablet TAKE 1 TABLET BY MOUTH EVERY DAY 90 tablet 1   carvedilol (COREG) 25 MG tablet Take 1 tablet (25 mg total) by mouth 2 (two) times daily with a meal. 60 tablet 0   hyoscyamine (LEVSIN SL) 0.125 MG SL tablet Place under the tongue 2 (two) times daily as needed.     icosapent Ethyl (VASCEPA) 1 g capsule Take 2 capsules (2 g total) by mouth 2 (two) times daily. 360 capsule 1   spironolactone (ALDACTONE) 25 MG tablet Take 1 tablet (25 mg total) by mouth daily. 90 tablet 0   warfarin (COUMADIN) 5 MG tablet TAKE 1.5 TABLETS DAILY, EXCEPT TAKE 1 TABLET ON MON, WED, AND FRI AS DIRECTED BY CLINIC. 120 tablet 1   augmented betamethasone dipropionate (DIPROLENE-AF) 0.05 % cream Apply topically 2 (two) times daily. 50 g 1   fluconazole (DIFLUCAN) 150 MG tablet Take 1 tablet (150 mg total) by mouth daily for 10 days. 10 tablet 0   No facility-administered medications prior to visit.    Allergies  Allergen  Reactions   Mesalamine Rash    REACTION: Rash    Review of Systems  Constitutional:  Negative for diaphoresis and fatigue.  HENT: Negative.  Negative for facial swelling, sinus pressure and sore throat.   Eyes: Negative.   Respiratory:  Negative for  cough, chest tightness, shortness of breath, wheezing and stridor.   Cardiovascular:  Negative for chest pain, palpitations and leg swelling.  Gastrointestinal:  Negative for abdominal pain, diarrhea, nausea and vomiting.  Endocrine: Negative.   Genitourinary: Negative.  Negative for difficulty urinating.  Musculoskeletal: Negative.   Skin:  Positive for rash. Negative for color change.  Neurological: Negative.  Negative for dizziness, weakness and headaches.  Hematological:  Negative for adenopathy. Does not bruise/bleed easily.  Psychiatric/Behavioral: Negative.        Objective:    Physical Exam Constitutional:      Appearance: He is not ill-appearing.  HENT:     Head:     Comments: Mucous membranes are normal    Nose: Nose normal. No congestion.     Mouth/Throat:     Lips: Pink.     Mouth: Mucous membranes are moist.     Pharynx: No pharyngeal swelling.  Eyes:     General: No scleral icterus.    Conjunctiva/sclera: Conjunctivae normal.  Cardiovascular:     Rate and Rhythm: Normal rate and regular rhythm.     Heart sounds: No murmur heard. Pulmonary:     Effort: Pulmonary effort is normal. No respiratory distress.     Breath sounds: No stridor. No wheezing, rhonchi or rales.  Abdominal:     General: Abdomen is flat.     Palpations: There is no mass.     Tenderness: There is no abdominal tenderness.  Musculoskeletal:        General: Normal range of motion.     Cervical back: Neck supple.     Right lower leg: No edema.     Left lower leg: No edema.  Lymphadenopathy:     Cervical: No cervical adenopathy.  Skin:    Findings: Rash present.     Comments: Over the left calf there is an area of xerosis and diffuse  excoriation.  There are some coalesced papules as well.  Over the right posterior axilla there are a few collections of linear urticarial wheals.  See photos.  There are no vesicles, target lesions, epidermolysis, or bullous lesions.  Neurological:     General: No focal deficit present.     Mental Status: He is alert.    BP (!) 144/80 (BP Location: Right Arm, Patient Position: Sitting, Cuff Size: Large)   Pulse 75   Temp 99 F (37.2 C) (Oral)   Ht 5' 9"  (1.753 m)   Wt 224 lb (101.6 kg)   SpO2 97%   BMI 33.08 kg/m  Wt Readings from Last 3 Encounters:  01/24/21 224 lb (101.6 kg)  01/18/21 226 lb (102.5 kg)  09/24/20 226 lb (102.5 kg)    Health Maintenance Due  Topic Date Due   OPHTHALMOLOGY EXAM  Never done   Zoster Vaccines- Shingrix (1 of 2) Never done   COVID-19 Vaccine (3 - Booster for Pfizer series) 04/21/2020    There are no preventive care reminders to display for this patient.   Lab Results  Component Value Date   TSH 1.48 01/18/2021   Lab Results  Component Value Date   WBC 6.8 01/18/2021   HGB 15.3 01/18/2021   HCT 44.6 01/18/2021   MCV 89.4 01/18/2021   PLT 377.0 01/18/2021   Lab Results  Component Value Date   NA 137 01/18/2021   K 2.9 (L) 01/18/2021   CO2 32 01/18/2021   GLUCOSE 95 01/18/2021   BUN 14 01/18/2021   CREATININE 0.99 01/18/2021  BILITOT 0.9 01/18/2021   ALKPHOS 83 01/18/2021   AST 15 01/18/2021   ALT 22 01/18/2021   PROT 7.3 01/18/2021   ALBUMIN 4.2 01/18/2021   CALCIUM 9.2 01/18/2021   ANIONGAP 10 07/07/2019   GFR 81.59 01/18/2021   Lab Results  Component Value Date   CHOL 142 01/18/2021   Lab Results  Component Value Date   HDL 33.10 (L) 01/18/2021   Lab Results  Component Value Date   LDLCALC 80 01/18/2021   Lab Results  Component Value Date   TRIG 142.0 01/18/2021   Lab Results  Component Value Date   CHOLHDL 4 01/18/2021   Lab Results  Component Value Date   HGBA1C 6.0 01/18/2021       Assessment &  Plan:   Problem List Items Addressed This Visit     Chronic gout due to renal impairment of multiple sites without tophus - Primary   Relevant Orders   YVO-P*92:92 Typing   LSC (lichen simplex chronicus)   Relevant Medications   augmented betamethasone dipropionate (DIPROLENE-AF) 0.05 % cream   Intrinsic eczema   Relevant Medications   levocetirizine (XYZAL) 5 MG tablet   augmented betamethasone dipropionate (DIPROLENE-AF) 0.05 % cream   Other Visit Diagnoses     Urticaria due to drug allergy       Relevant Medications   levocetirizine (XYZAL) 5 MG tablet        Meds ordered this encounter  Medications   methylPREDNISolone acetate (DEPO-MEDROL) injection 120 mg   levocetirizine (XYZAL) 5 MG tablet    Sig: Take 1 tablet (5 mg total) by mouth every evening.    Dispense:  90 tablet    Refill:  1   augmented betamethasone dipropionate (DIPROLENE-AF) 0.05 % cream    Sig: Apply topically 2 (two) times daily.    Dispense:  50 g    Refill:  1     Scarlette Calico, MD

## 2021-01-26 DIAGNOSIS — T50905A Adverse effect of unspecified drugs, medicaments and biological substances, initial encounter: Secondary | ICD-10-CM | POA: Insufficient documentation

## 2021-01-26 DIAGNOSIS — L500 Allergic urticaria: Secondary | ICD-10-CM | POA: Insufficient documentation

## 2021-01-26 NOTE — Assessment & Plan Note (Signed)
On examination he has 2 distinct rashes.  Over the left calf it looks like eczema.  Over the right posterior axilla and looks like urticaria.  He has been taking allopurinol so I doubt that his allopurinol hypersensitivity but I have ordered the HLA typing to see if he is at risk for allopurinol hypersensitivity.  I am concerned this is fluconazole so have asked him to stop taking this.  We will treat with an injection of methylprednisolone, oral antihistamines, and topical steroids.

## 2021-01-26 NOTE — Assessment & Plan Note (Signed)
Will discontinue fluconazole.  Will screen for allopurinol hypersensitivity.  He will start using an oral antihistamine.

## 2021-01-26 NOTE — Assessment & Plan Note (Signed)
Improvement noted 

## 2021-01-27 ENCOUNTER — Other Ambulatory Visit: Payer: Self-pay | Admitting: Internal Medicine

## 2021-01-27 DIAGNOSIS — I10 Essential (primary) hypertension: Secondary | ICD-10-CM

## 2021-01-28 LAB — HLA-B*58:01 TYPING: HLA-B*58:01 Typing: NEGATIVE

## 2021-02-03 ENCOUNTER — Other Ambulatory Visit: Payer: Self-pay

## 2021-02-03 ENCOUNTER — Ambulatory Visit (INDEPENDENT_AMBULATORY_CARE_PROVIDER_SITE_OTHER): Payer: Medicare Other | Admitting: General Practice

## 2021-02-03 DIAGNOSIS — I743 Embolism and thrombosis of arteries of the lower extremities: Secondary | ICD-10-CM

## 2021-02-03 DIAGNOSIS — Z7901 Long term (current) use of anticoagulants: Secondary | ICD-10-CM

## 2021-02-03 LAB — POCT INR: INR: 5.6 — AB (ref 2.0–3.0)

## 2021-02-03 NOTE — Patient Instructions (Addendum)
Pre visit review using our clinic review tool, if applicable. No additional management support is needed unless otherwise documented below in the visit note.  Do not take coumadin today, tomorrow or Saturday.  On Sunday continue to take 1 1/2 daily except take 1 tablet on Wednesdays.  Re-check in 7 to 10 days.

## 2021-02-03 NOTE — Progress Notes (Signed)
I have reviewed and agree.

## 2021-02-08 DIAGNOSIS — I10 Essential (primary) hypertension: Secondary | ICD-10-CM | POA: Diagnosis not present

## 2021-02-15 ENCOUNTER — Ambulatory Visit (INDEPENDENT_AMBULATORY_CARE_PROVIDER_SITE_OTHER): Payer: Medicare Other | Admitting: General Practice

## 2021-02-15 ENCOUNTER — Other Ambulatory Visit: Payer: Self-pay

## 2021-02-15 DIAGNOSIS — I743 Embolism and thrombosis of arteries of the lower extremities: Secondary | ICD-10-CM | POA: Diagnosis not present

## 2021-02-15 DIAGNOSIS — Z7901 Long term (current) use of anticoagulants: Secondary | ICD-10-CM | POA: Diagnosis not present

## 2021-02-15 LAB — POCT INR: INR: 2.4 (ref 2.0–3.0)

## 2021-02-15 NOTE — Patient Instructions (Signed)
Pre visit review using our clinic review tool, if applicable. No additional management support is needed unless otherwise documented below in the visit note.  Continue to take 1 1/2 daily except take 1 tablet on Wednesdays.  Re-check in 4 weeks.

## 2021-02-16 NOTE — Progress Notes (Signed)
I have reviewed and agree.

## 2021-02-17 DIAGNOSIS — I10 Essential (primary) hypertension: Secondary | ICD-10-CM | POA: Diagnosis not present

## 2021-02-18 ENCOUNTER — Telehealth: Payer: Self-pay | Admitting: *Deleted

## 2021-02-18 NOTE — Chronic Care Management (AMB) (Signed)
  Chronic Care Management   Note  02/18/2021 Name: Rick Edwards MRN: [id number] DOB: 05-02-1956  Rick Edwards is a 65 y.o. year old male who is a primary care patient of Janith Lima, MD. I reached out to [name] by phone today in response to a referral sent by [name]'s PCP Janith Lima, MD     [name] was given information about Chronic Care Management services today including:  CCM service includes personalized support from designated clinical staff supervised by his physician, including individualized plan of care and coordination with other care providers 24/7 contact phone numbers for assistance for urgent and routine care needs. Service will only be billed when office clinical staff spend 20 minutes or more in a month to coordinate care. Only one practitioner may furnish and bill the service in a calendar month. The patient may stop CCM services at any time (effective at the end of the month) by phone call to the office staff. The patient will be responsible for cost sharing (co-pay) of up to 20% of the service fee (after annual deductible is met).  Patient agreed to services and verbal consent obtained.   Follow up plan: Telephone appointment with care management team member scheduled for:03/17/2021  Julian Hy, [place] [facility]  Direct Dial: [phone]

## 2021-02-25 ENCOUNTER — Other Ambulatory Visit: Payer: Self-pay | Admitting: Internal Medicine

## 2021-02-25 DIAGNOSIS — I743 Embolism and thrombosis of arteries of the lower extremities: Secondary | ICD-10-CM

## 2021-03-02 ENCOUNTER — Ambulatory Visit (INDEPENDENT_AMBULATORY_CARE_PROVIDER_SITE_OTHER): Payer: Medicare Other | Admitting: Internal Medicine

## 2021-03-02 ENCOUNTER — Encounter: Payer: Self-pay | Admitting: Internal Medicine

## 2021-03-02 ENCOUNTER — Other Ambulatory Visit: Payer: Self-pay

## 2021-03-02 VITALS — BP 154/96 | HR 70 | Temp 98.4°F | Resp 16 | Ht 69.0 in | Wt 220.0 lb

## 2021-03-02 DIAGNOSIS — I10 Essential (primary) hypertension: Secondary | ICD-10-CM | POA: Diagnosis not present

## 2021-03-02 DIAGNOSIS — N400 Enlarged prostate without lower urinary tract symptoms: Secondary | ICD-10-CM | POA: Diagnosis not present

## 2021-03-02 DIAGNOSIS — E876 Hypokalemia: Secondary | ICD-10-CM

## 2021-03-02 DIAGNOSIS — I743 Embolism and thrombosis of arteries of the lower extremities: Secondary | ICD-10-CM

## 2021-03-02 LAB — BASIC METABOLIC PANEL
BUN: 17 mg/dL (ref 6–23)
CO2: 31 mEq/L (ref 19–32)
Calcium: 9.5 mg/dL (ref 8.4–10.5)
Chloride: 95 mEq/L — ABNORMAL LOW (ref 96–112)
Creatinine, Ser: 1.06 mg/dL (ref 0.40–1.50)
GFR: 75.1 mL/min (ref >60.00)
Glucose, Bld: 98 mg/dL (ref 70–99)
Potassium: 3.8 mEq/L (ref 3.5–5.1)
Sodium: 134 mEq/L — ABNORMAL LOW (ref 135–145)

## 2021-03-02 LAB — MAGNESIUM: Magnesium: 1.6 mg/dL (ref 1.5–2.5)

## 2021-03-02 LAB — PSA: PSA: 2.08 ng/mL (ref 0.10–4.00)

## 2021-03-02 MED ORDER — AMLODIPINE BESYLATE 10 MG PO TABS: 10.0000 mg | ORAL_TABLET | Freq: Every day | ORAL | 1 refills | Status: DC

## 2021-03-02 NOTE — Patient Instructions (Signed)

## 2021-03-02 NOTE — Progress Notes (Signed)
Subjective:  Patient ID: Rick Edwards, male    DOB: 1955-12-05  Age: 65 y.o. MRN: [id number]  CC: Hypertension  This visit occurred during the SARS-CoV-2 public health emergency.  Safety protocols were in place, including screening questions prior to the visit, additional usage of staff PPE, and extensive cleaning of exam room while observing appropriate contact time as indicated for disinfecting solutions.    HPI Rick Edwards presents for f/up -  The rash has resolved.  In hindsight he thinks it was caused by ant bites.  He is active and denies any recent episodes of chest pain, shortness of breath, palpitations, dizziness, lightheadedness, or edema.  Outpatient Medications Prior to Visit  Medication Sig Dispense Refill   allopurinol (ZYLOPRIM) 300 MG tablet Take 1 tablet (300 mg total) by mouth daily. 90 tablet 0   atorvastatin (LIPITOR) 40 MG tablet TAKE 1 TABLET BY MOUTH EVERY DAY 90 tablet 1   augmented betamethasone dipropionate (DIPROLENE-AF) 0.05 % cream Apply topically 2 (two) times daily. 50 g 1   carvedilol (COREG) 25 MG tablet TAKE 1 TABLET BY MOUTH TWICE A DAY WITH MEALS 180 tablet 1   hyoscyamine (LEVSIN SL) 0.125 MG SL tablet Place under the tongue 2 (two) times daily as needed.     icosapent Ethyl (VASCEPA) 1 g capsule Take 2 capsules (2 g total) by mouth 2 (two) times daily. 360 capsule 1   levocetirizine (XYZAL) 5 MG tablet Take 1 tablet (5 mg total) by mouth every evening. 90 tablet 1   spironolactone (ALDACTONE) 25 MG tablet Take 1 tablet (25 mg total) by mouth daily. 90 tablet 0   warfarin (COUMADIN) 5 MG tablet TAKE 1.5 TABLETS DAILY, EXCEPT TAKE 1 TABLET ON MON, WED, AND FRI AS DIRECTED BY CLINIC. 120 tablet 1   amLODipine (NORVASC) 10 MG tablet Take 10 mg by mouth at bedtime.     No facility-administered medications prior to visit.    ROS Review of Systems  Constitutional:  Negative for chills, diaphoresis, fatigue and fever.  HENT: Negative.     Eyes: Negative.   Respiratory:  Negative for cough, chest tightness, shortness of breath and wheezing.   Cardiovascular:  Negative for chest pain, palpitations and leg swelling.  Gastrointestinal:  Negative for abdominal pain, constipation, diarrhea, nausea and vomiting.  Endocrine: Negative.   Genitourinary: Negative.  Negative for difficulty urinating.  Musculoskeletal: Negative.   Skin: Negative.   Neurological: Negative.  Negative for dizziness, weakness and numbness.  Hematological:  Negative for adenopathy. Does not bruise/bleed easily.  Psychiatric/Behavioral: Negative.     Objective:  BP (!) 154/96 (BP Location: Right Arm, Patient Position: Supine, Cuff Size: Large)   Pulse 70   Temp 98.4 F (36.9 C) (Oral)   Resp 16   Ht '5\' 9"'$  (1.753 m)   Wt 220 lb (99.8 kg)   SpO2 97%   BMI 32.49 kg/m   BP Readings from Last 3 Encounters:  03/02/21 (!) 154/96  01/24/21 (!) 144/80  01/18/21 130/72    Wt Readings from Last 3 Encounters:  03/02/21 220 lb (99.8 kg)  01/24/21 224 lb (101.6 kg)  01/18/21 226 lb (102.5 kg)    Physical Exam Vitals reviewed.  HENT:     Nose: Nose normal.     Mouth/Throat:     Mouth: Mucous membranes are moist.  Eyes:     Conjunctiva/sclera: Conjunctivae normal.  Cardiovascular:     Rate and Rhythm: Normal rate and regular rhythm.  Heart sounds: No murmur heard. Pulmonary:     Effort: Pulmonary effort is normal.     Breath sounds: No stridor. No wheezing or rhonchi.  Abdominal:     General: Abdomen is flat. Bowel sounds are normal. There is no distension.     Palpations: Abdomen is soft. There is no hepatomegaly, splenomegaly or mass.     Tenderness: There is no abdominal tenderness.     Hernia: There is no hernia in the left inguinal area or right inguinal area.  Genitourinary:    Pubic Area: No rash.      Penis: Normal.      Testes: Normal.     Epididymis:     Right: Normal.     Left: Normal.     Prostate: Enlarged (1+ smooth symm  BPH). Not tender and no nodules present.     Rectum: Normal. Guaiac result negative. No mass, tenderness, anal fissure, external hemorrhoid or internal hemorrhoid. Normal anal tone.  Musculoskeletal:        General: Normal range of motion.     Cervical back: Neck supple.  Lymphadenopathy:     Cervical: No cervical adenopathy.     Lower Body: No right inguinal adenopathy. No left inguinal adenopathy.  Skin:    General: Skin is warm.     Findings: No rash.  Neurological:     General: No focal deficit present.     Mental Status: He is alert.  Psychiatric:        Mood and Affect: Mood normal.    Lab Results  Component Value Date   WBC 6.8 01/18/2021   HGB 15.3 01/18/2021   HCT 44.6 01/18/2021   PLT 377.0 01/18/2021   GLUCOSE 98 03/02/2021   CHOL 142 01/18/2021   TRIG 142.0 01/18/2021   HDL 33.10 (L) 01/18/2021   LDLDIRECT 97.0 05/05/2019   LDLCALC 80 01/18/2021   ALT 22 01/18/2021   AST 15 01/18/2021   NA 134 (L) 03/02/2021   K 3.8 03/02/2021   CL 95 (L) 03/02/2021   CREATININE 1.06 03/02/2021   BUN 17 03/02/2021   CO2 31 03/02/2021   TSH 1.48 01/18/2021   PSA 2.08 03/02/2021   INR 2.4 02/15/2021   HGBA1C 6.0 01/18/2021   MICROALBUR 10.4 (H) 06/01/2020    No results found.  Assessment & Plan:   Rick Edwards was seen today for hypertension.  Diagnoses and all orders for this visit:  Chronic hypokalemia- His potassium level is in the new low normal range at 3.8.  His blood pressure is not well controlled so I am adding a loop diuretic.  I have also recommended that he add a potassium supplement and to stay on the current dose of spironolactone. -     Cancel: Aldosterone + renin activity w/ ratio; Future -     Basic metabolic panel; Future -     Magnesium; Future -     Magnesium -     Basic metabolic panel -     potassium chloride (KLOR-CON 10) 10 MEQ tablet; Take 1 tablet (10 mEq total) by mouth 2 (two) times daily.  Essential hypertension, malignant- His blood  pressure is not adequately well controlled.  Will restart amlodipine.  He has mild hyponatremia so will add a loop diuretic. -     Cancel: Aldosterone + renin activity w/ ratio; Future -     amLODipine (NORVASC) 10 MG tablet; Take 1 tablet (10 mg total) by mouth at bedtime. -  torsemide (DEMADEX) 20 MG tablet; Take 1 tablet (20 mg total) by mouth daily. -     potassium chloride (KLOR-CON 10) 10 MEQ tablet; Take 1 tablet (10 mEq total) by mouth 2 (two) times daily.  Benign prostatic hyperplasia without lower urinary tract symptoms -     PSA; Future -     PSA  I have changed Rick Edwards "Reggie"'s amLODipine. I am also having him start on torsemide and potassium chloride. Additionally, I am having him maintain his icosapent Ethyl, hyoscyamine, atorvastatin, spironolactone, allopurinol, levocetirizine, augmented betamethasone dipropionate, carvedilol, and warfarin.  Meds ordered this encounter  Medications   amLODipine (NORVASC) 10 MG tablet    Sig: Take 1 tablet (10 mg total) by mouth at bedtime.    Dispense:  90 tablet    Refill:  1   torsemide (DEMADEX) 20 MG tablet    Sig: Take 1 tablet (20 mg total) by mouth daily.    Dispense:  90 tablet    Refill:  0   potassium chloride (KLOR-CON 10) 10 MEQ tablet    Sig: Take 1 tablet (10 mEq total) by mouth 2 (two) times daily.    Dispense:  180 tablet    Refill:  0      Follow-up: Return in about 3 months (around 06/02/2021).  Scarlette Calico, MD

## 2021-03-03 ENCOUNTER — Encounter: Payer: Self-pay | Admitting: Internal Medicine

## 2021-03-03 MED ORDER — POTASSIUM CHLORIDE ER 10 MEQ PO TBCR: 10.0000 meq | EXTENDED_RELEASE_TABLET | Freq: Two times a day (BID) | ORAL | 0 refills | Status: DC

## 2021-03-03 MED ORDER — TORSEMIDE 20 MG PO TABS: 20.0000 mg | ORAL_TABLET | Freq: Every day | ORAL | 0 refills | Status: DC

## 2021-03-15 ENCOUNTER — Other Ambulatory Visit: Payer: Self-pay

## 2021-03-15 ENCOUNTER — Ambulatory Visit (INDEPENDENT_AMBULATORY_CARE_PROVIDER_SITE_OTHER): Payer: Medicare Other | Admitting: General Practice

## 2021-03-15 DIAGNOSIS — Z7901 Long term (current) use of anticoagulants: Secondary | ICD-10-CM | POA: Diagnosis not present

## 2021-03-15 DIAGNOSIS — I743 Embolism and thrombosis of arteries of the lower extremities: Secondary | ICD-10-CM | POA: Diagnosis not present

## 2021-03-15 LAB — POCT INR: INR: 2.7 (ref 2.0–3.0)

## 2021-03-15 NOTE — Patient Instructions (Addendum)
Pre visit review using our clinic review tool, if applicable. No additional management support is needed unless otherwise documented below in the visit note.  Continue to take 1 1/2 daily except take 1 tablet on Wednesdays.  Re-check in 4 weeks.

## 2021-03-17 ENCOUNTER — Ambulatory Visit (INDEPENDENT_AMBULATORY_CARE_PROVIDER_SITE_OTHER): Payer: Medicare Other | Admitting: *Deleted

## 2021-03-17 DIAGNOSIS — E785 Hyperlipidemia, unspecified: Secondary | ICD-10-CM

## 2021-03-17 DIAGNOSIS — I10 Essential (primary) hypertension: Secondary | ICD-10-CM

## 2021-03-17 NOTE — Chronic Care Management (AMB) (Signed)
Chronic Care Management   CCM RN Visit Note  03/17/2021 Name: Rick Edwards MRN: [id number] DOB: 14-Sep-1955  Subjective: Rick Edwards is a 65 y.o. year old male who is a primary care patient of Janith Lima, MD. The care management team was consulted for assistance with disease management and care coordination needs.    Engaged with patient by telephone for initial visit in response to provider referral for case management and/or care coordination services.   Consent to Services:  The patient was given information about Chronic Care Management services, agreed to services, and gave verbal consent 02/18/21 prior to initiation of services.  Please see initial visit note for detailed documentation.  Patient agreed to services and verbal consent obtained.   Assessment: Review of patient past medical history, allergies, medications, health status, including review of consultants reports, laboratory and other test data, was performed as part of comprehensive evaluation and provision of chronic care management services.   SDOH (Social Determinants of Health) assessments and interventions performed:  SDOH Interventions    Flowsheet Row Most Recent Value  SDOH Interventions   Food Insecurity Interventions Intervention Not Indicated  Financial Strain Interventions Intervention Not Indicated  Housing Interventions Intervention Not Indicated  Transportation Interventions Intervention Not Indicated  [Patient drives self]       CCM Care Plan Allergies  Allergen Reactions   Mesalamine Rash    REACTION: Rash   Outpatient Encounter Medications as of 03/17/2021  Medication Sig Note   allopurinol (ZYLOPRIM) 300 MG tablet Take 1 tablet (300 mg total) by mouth daily.    amLODipine (NORVASC) 10 MG tablet Take 1 tablet (10 mg total) by mouth at bedtime.    atorvastatin (LIPITOR) 40 MG tablet TAKE 1 TABLET BY MOUTH EVERY DAY    carvedilol (COREG) 25 MG tablet TAKE 1 TABLET BY MOUTH TWICE A  DAY WITH MEALS    chlorthalidone (HYGROTON) 25 MG tablet Take 25 mg by mouth daily. 03/17/2021: 03/17/21: Patient reports has been taking this medication for several years- was not on his medication list   hyoscyamine (LEVSIN SL) 0.125 MG SL tablet Place under the tongue 2 (two) times daily as needed. 03/17/2021: 03/17/21: Reports has not needed recently   icosapent Ethyl (VASCEPA) 1 g capsule Take 2 capsules (2 g total) by mouth 2 (two) times daily.    spironolactone (ALDACTONE) 25 MG tablet Take 1 tablet (25 mg total) by mouth daily.    torsemide (DEMADEX) 20 MG tablet Take 1 tablet (20 mg total) by mouth daily.    warfarin (COUMADIN) 5 MG tablet TAKE 1.5 TABLETS DAILY, EXCEPT TAKE 1 TABLET ON MON, WED, AND FRI AS DIRECTED BY CLINIC.    augmented betamethasone dipropionate (DIPROLENE-AF) 0.05 % cream Apply topically 2 (two) times daily. (Patient not taking: Reported on 03/17/2021)    levocetirizine (XYZAL) 5 MG tablet Take 1 tablet (5 mg total) by mouth every evening. (Patient not taking: Reported on 03/17/2021)    potassium chloride (KLOR-CON 10) 10 MEQ tablet Take 1 tablet (10 mEq total) by mouth 2 (two) times daily. (Patient not taking: Reported on 03/17/2021) 03/17/2021: 03/17/21: Patient reports this medication did not come in outpatient pharmacy prescription from PCP OV 03/02/21-- care coordination outreach to pharmacy completed- prescription in progress now, patient understands to pick up and start taking today   [DISCONTINUED] fenofibrate (TRICOR) 145 MG tablet Take 1 tablet by mouth once daily    [DISCONTINUED] indapamide (LOZOL) 1.25 MG tablet Take 1 tablet (1.25 mg total) by  mouth daily. 07/07/2019: palpitations   No facility-administered encounter medications on file as of 03/17/2021.   Patient Active Problem List   Diagnosis Date Noted   Benign prostatic hyperplasia without lower urinary tract symptoms 03/02/2021   Intrinsic eczema 01/24/2021   Chronic hypokalemia 97/67/3419   LSC (lichen  simplex chronicus) 01/18/2021   Tinea corporis 01/18/2021   Hearing loss due to cerumen impaction, bilateral 06/01/2020   Chronic gout due to renal impairment of multiple sites without tophus 06/01/2020   Hypertensive urgency, malignant 08/12/2019   Type II diabetes mellitus with manifestations ([place]) 08/17/2018   Atherosclerosis of aorta ([place]) 09/26/2017   Drug-induced erectile dysfunction 07/19/2016   Pure hypertriglyceridemia 04/05/2016   Hyperlipidemia with target LDL less than 70 01/18/2016   Proteinuria 03/13/2014   OA (osteoarthritis) of knee 11/25/2013   PVD (peripheral vascular disease) ([place]) 09/24/2013   Encounter for therapeutic drug monitoring 09/16/2013   Kidney disease, chronic, stage III (GFR 30-59 ml/min) ([place]) 05/26/2013   Antithrombin III deficiency ([place]) 05/16/2013   Long term (current) use of anticoagulants 11/27/2012   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 ([place]) 04/10/2012   Splenic infarction 03/20/2012   Routine general medical examination at a health care facility 08/29/2011   [facility] [place] 03/19/2009   Essential hypertension, malignant 03/02/2009   Conditions to be addressed/monitored:  HTN and HLD  Care Plan : RN Care Manager Plan of Care  Updates made by Knox Royalty, RN since 03/17/2021 12:00 AM     Problem: Chronic Disease Management Needs   Priority: Medium     Long-Range Goal: Development of plan of care for long term chronic disease management   Start Date: 03/17/2021  Expected End Date: 03/17/2022  Priority: Medium  Note:   Current Barriers:  Chronic Disease Management support and education needs related to HTN and HLD  RNCM Clinical Goal(s):  Patient will demonstrate ongoing health management independence for HTN/ HLD  through collaboration with RN Care manager, provider, and care team.   Interventions: 1:1 collaboration with primary care provider regarding development and update of comprehensive plan of  care as evidenced by provider attestation and co-signature Inter-disciplinary care team collaboration (see longitudinal plan of care) Evaluation of current treatment plan related to  self management and patient's adherence to plan as established by provider  Hyperlipidemia:  (Status: New goal.) Lab Results  Component Value Date   CHOL 142 01/18/2021   HDL 33.10 (L) 01/18/2021   [place] 80 01/18/2021   LDLDIRECT 97.0 05/05/2019   TRIG 142.0 01/18/2021   CHOLHDL 4 01/18/2021  Medication review performed; medication list updated in electronic medical record- reviewed recent changes to patent's medications post- PCP office visit 7/27 Confirmed patient obtained and is taking amlodipine and torsemide as prescribed Patient reports did not have K-Chlor/ potassium included in his medications when he obtained new prescriptions: discussed with patient rationale/ importance of taking potassium Care Coordination outreach via 3-way call placed to patient's outpatient pharmacy: [facility]; spoke with "Sonia Side" who confirms that this prescription was "placed on hold;" he is unable to tell me purpose of/ reason for hold status: confirmed he will fill today and will be ready for pick up today Advised patient to pick up/ start taking potassium today once he obtains medication: he verbalizes understanding and agreement Noted that patient reported currently taking chlorthalidone 25 mg QD: this is not on his current medication list: will message PCP as FYI to inform, given recent changes to medications Counseled  on importance of regular laboratory monitoring as prescribed; Provided HLD educational materials; Reviewed importance of limiting foods high in cholesterol; Reviewed exercise goals and target of 150 minutes per week; Screening for signs and symptoms of depression related to chronic disease state;  Assessed social determinant of health barriers;   Hypertension: (Status: New  goal.) Last practice recorded BP readings:  BP Readings from Last 3 Encounters:  03/02/21 (!) 154/96  01/24/21 (!) 144/80  01/18/21 130/72  Most recent eGFR/CrCl: No results found for: EGFR  No components found for: CRCL  Evaluation of current treatment plan related to hypertension self management and patient's adherence to plan as established by provider;   Reviewed prescribed diet heart healthy, low sodium, low cholesterol, coumadin- restricted Reviewed medications with patient and discussed importance of compliance;  Counseled on the importance of exercise goals with target of 150 minutes per week Discussed plans with patient for ongoing care management follow up and provided patient with direct contact information for care management team; Discussed complications of poorly controlled blood pressure such as heart disease, stroke, circulatory complications, vision complications, kidney impairment, sexual dysfunction;  Discussed home BP monitoring/ recording with patient: encouraged him to monitor/ write down blood pressures weekly- he is agreeable; reports family lives next door and has blood pressure cuff  Encouraged patient to keep recorded blood pressures on dedicated paper to share with PCP at next office visit Reviewed with patient upcoming provider appointments, including Coumadin clinic 04/12/21; PCP office visit 06/06/21 Discussed patient's recent addition of walking into his routine: encouraged ongoing physical activity/ exercise; discussed benefit of activity in setting of HTN/ HLD  Patient Goals/Self-Care Activities: Patient will self administer medications as prescribed Patient will attend all scheduled provider appointments Patient will call pharmacy for medication refills Patient will attend church or other social activities Patient will call provider office for new concerns or questions Patient will begin monitoring and writing down blood pressures at home each week; he will  take written blood pressures form home to his PCP office visit on June 06, 2021 Patient will continue following a heart-healthy, low salt, low cholesterol, and Coumadin-restricted diet Patient will continue to make efforts to walk for exercise on a weekly basis        Plan:  Telephone follow up appointment with care management team member scheduled for:  Tuesday June 07, 2021 at 2:00 pm The patient has been provided with contact information for the care management team and has been advised to call with any health related questions or concerns.   Oneta Rack, RN, BSN, [facility] RN Care Coordination- [place] [phone]: direct office [phone]: mobile

## 2021-03-17 NOTE — Progress Notes (Signed)
I have reviewed and agree.

## 2021-03-17 NOTE — Patient Instructions (Signed)
Visit Information   Rick Edwards, it was nice talking with you today.   Please read over the attached information, we will talk about this during each of our phone call appointments  Please start checking your blood pressures at home once a week- write down your blood pressures on paper and take to your appointment with Dr. Ronnald Ramp on June 06, 2021: this will help him decide if you are on the right medications   I look forward to talking to you again for an update on Tuesday June 07, 2021 at 2:00 pm- please be listening out for my call that day.  I will call as close to 2:00 pm as possible.   If you need to cancel or re-schedule our telephone visit, please call [phone] and one of our care guides will be happy to assist you.   I look forward to hearing about your progress.   Please don't hesitate to contact me if I can be of assistance to you before our next scheduled telephone appointment.   Oneta Rack, RN, BSN, [facility] RN Care Coordination- [place] [phone]: direct office [phone]: mobile   PATIENT GOALS:   Goals Addressed             This Visit's Progress    Patient Self-Care Activities   On track    Timeframe:  Long-Range Goal Priority:  Medium Start Date:          03/17/21                   Expected End Date:    03/17/22                   Patient will self administer medications as prescribed Patient will attend all scheduled provider appointments Patient will call pharmacy for medication refills Patient will attend church or other social activities Patient will call provider office for new concerns or questions Patient will begin monitoring and writing down blood pressures at home each week; he will take written blood pressures form home to his PCP office visit on June 06, 2021 Patient will continue following a heart-healthy, low salt, low cholesterol, and Coumadin-restricted diet Patient will continue to make efforts  to walk for exercise on a weekly basis       Heart-Healthy Eating Plan Heart-healthy meal planning includes: Eating less unhealthy fats. Eating more healthy fats. Making other changes in your diet. Talk with your doctor or a diet specialist (dietitian) to create an eating plan that is right for you. What is my plan? Your doctor may recommend an eating plan that includes: Total fat: ______% or less of total calories a day. Saturated fat: ______% or less of total calories a day. Cholesterol: less than _________mg a day. What are tips for following this plan? Cooking Avoid frying your food. Try to bake, boil, grill, or broil it instead. You can also reduce fat by: Removing the skin from poultry. Removing all visible fats from meats. Steaming vegetables in water or broth. Meal planning  At meals, divide your plate into four equal parts: Fill one-half of your plate with vegetables and green salads. Fill one-fourth of your plate with whole grains. Fill one-fourth of your plate with lean protein foods. Eat 4-5 servings of vegetables per day. A serving of vegetables is: 1 cup of raw or cooked vegetables. 2 cups of raw leafy greens. Eat 4-5 servings of fruit per day. A serving of fruit is: 1  medium whole fruit.  cup of dried fruit.  cup of fresh, frozen, or canned fruit.  cup of 100% fruit juice. Eat more foods that have soluble fiber. These are apples, broccoli, carrots, beans, peas, and barley. Try to get 20-30 g of fiber per day. Eat 4-5 servings of nuts, legumes, and seeds per week: 1 serving of dried beans or legumes equals  cup after being cooked. 1 serving of nuts is  cup. 1 serving of seeds equals 1 tablespoon.  General information Eat more home-cooked food. Eat less restaurant, buffet, and fast food. Limit or avoid alcohol. Limit foods that are high in starch and sugar. Avoid fried foods. Lose weight if you are overweight. Keep track of how much salt (sodium) you  eat. This is important if you have high blood pressure. Ask your doctor to tell you more about this. Try to add vegetarian meals each week. Fats Choose healthy fats. These include olive oil and canola oil, flaxseeds, walnuts, almonds, and seeds. Eat more omega-3 fats. These include salmon, mackerel, sardines, tuna, flaxseed oil, and ground flaxseeds. Try to eat fish at least 2 times each week. Check food labels. Avoid foods with trans fats or high amounts of saturated fat. Limit saturated fats. These are often found in animal products, such as meats, butter, and cream. These are also found in plant foods, such as palm oil, palm kernel oil, and coconut oil. Avoid foods with partially hydrogenated oils in them. These have trans fats. Examples are stick margarine, some tub margarines, cookies, crackers, and other baked goods. What foods can I eat? Fruits All fresh, canned (in natural juice), or frozen fruits. Vegetables Fresh or frozen vegetables (raw, steamed, roasted, or grilled). Green salads. Grains Most grains. Choose whole wheat and whole grains most of the time. Rice andpasta, including brown rice and pastas made with whole wheat. Meats and other proteins Lean, well-trimmed beef, veal, pork, and lamb. Chicken and [place] without skin. All fish and shellfish. Wild duck, rabbit, pheasant, and venison. Egg whites or low-cholesterol egg substitutes. Dried beans, peas, lentils, and tofu. Seedsand most nuts. Dairy Low-fat or nonfat cheeses, including ricotta and mozzarella. Skim or 1% milk that is liquid, powdered, or evaporated. Buttermilk that is made with low-fatmilk. Nonfat or low-fat yogurt. Fats and oils Non-hydrogenated (trans-free) margarines. Vegetable oils, including soybean, sesame, sunflower, olive, peanut, safflower, corn, canola, and cottonseed. Salad dressings or mayonnaisemade with a vegetable oil. Beverages Mineral water. Coffee and tea. Diet carbonated beverages. Sweets and  desserts Sherbet, gelatin, and fruit ice. Small amounts of dark chocolate. Limit all sweets and desserts. Seasonings and condiments All seasonings and condiments. The items listed above may not be a complete list of foods and drinks you can eat. Contact a dietitian for more options. What foods should I avoid? Fruits Canned fruit in heavy syrup. Fruit in cream or butter sauce. Fried fruit. Limitcoconut. Vegetables Vegetables cooked in cheese, cream, or butter sauce. Fried vegetables. Grains Breads that are made with saturated or trans fats, oils, or whole milk. Croissants. Sweet rolls. Donuts. High-fat crackers,such as cheese crackers. Meats and other proteins Fatty meats, such as hot dogs, ribs, sausage, bacon, rib-eye roast or steak. High-fat deli meats, such as salami and bologna. Caviar. Domestic duck andgoose. Organ meats, such as liver. Dairy Cream, sour cream, cream cheese, and creamed cottage cheese. Whole-milk cheeses. Whole or 2% milk that is liquid, evaporated, or condensed. Whole buttermilk. Cream sauce or high-fat cheese sauce. Yogurt that is made fromwhole milk. Fats and  oils Meat fat, or shortening. Cocoa butter, hydrogenated oils, palm oil, coconut oil, palm kernel oil. Solid fats and shortenings, including bacon fat, salt pork, lard, and butter. Nondairy cream substitutes. Salad dressings with cheeseor sour cream. Beverages Regular sodas and juice drinks with added sugar. Sweets and desserts Frosting. Pudding. Cookies. Cakes. Pies. Milk chocolate or white chocolate.Buttered syrups. Full-fat ice cream or ice cream drinks. The items listed above may not be a complete list of foods and drinks to avoid. Contact a dietitian for more information. Summary Heart-healthy meal planning includes eating less unhealthy fats, eating more healthy fats, and making other changes in your diet. Eat a balanced diet. This includes fruits and vegetables, low-fat or nonfat dairy, lean protein,  nuts and legumes, whole grains, and heart-healthy oils and fats. This information is not intended to replace advice given to you by your health care provider. Make sure you discuss any questions you have with your healthcare provider. Document Revised: 09/27/2017 Document Reviewed: 08/31/2017 Elsevier Patient Education  2022 [place].  Cholesterol Content in Foods Cholesterol is a waxy, fat-like substance that helps to carry fat in the blood. The body needs cholesterol in small amounts, but too much cholesterol can causedamage to the arteries and heart. Most people should eat less than 200 milligrams (mg) of cholesterol a day. Foods with cholesterol  Cholesterol is found in animal-based foods, such as meat, seafood, and dairy. Generally, low-fat dairy and lean meats have less cholesterol than full-fat dairy and fatty meats. The milligrams of cholesterol per serving (mg per serving) of common cholesterol-containing foods are listed below. Meat and other proteins Egg -- one large whole egg has 186 mg. Veal shank -- 4 oz has 141 mg. Lean ground [place] (93% lean) -- 4 oz has 118 mg. Fat-trimmed lamb loin -- 4 oz has 106 mg. Lean ground beef (90% lean) -- 4 oz has 100 mg. Lobster -- 3.5 oz has 90 mg. Pork loin chops -- 4 oz has 86 mg. Canned salmon -- 3.5 oz has 83 mg. Fat-trimmed beef top loin -- 4 oz has 78 mg. Frankfurter -- 1 frank (3.5 oz) has 77 mg. Crab -- 3.5 oz has 71 mg. Roasted chicken without skin, white meat -- 4 oz has 66 mg. Light bologna -- 2 oz has 45 mg. Deli-cut [place] -- 2 oz has 31 mg. Canned tuna -- 3.5 oz has 31 mg. Berniece Salines -- 1 oz has 29 mg. Oysters and mussels (raw) -- 3.5 oz has 25 mg. Mackerel -- 1 oz has 22 mg. Trout -- 1 oz has 20 mg. Pork sausage -- 1 link (1 oz) has 17 mg. Salmon -- 1 oz has 16 mg. Tilapia -- 1 oz has 14 mg. Dairy Soft-serve ice cream --  cup (4 oz) has 103 mg. Whole-milk yogurt -- 1 cup (8 oz) has 29 mg. Cheddar cheese -- 1 oz has  28 mg. American cheese -- 1 oz has 28 mg. Whole milk -- 1 cup (8 oz) has 23 mg. 2% milk -- 1 cup (8 oz) has 18 mg. Cream cheese -- 1 tablespoon (Tbsp) has 15 mg. Cottage cheese --  cup (4 oz) has 14 mg. Low-fat (1%) milk -- 1 cup (8 oz) has 10 mg. Sour cream -- 1 Tbsp has 8.5 mg. Low-fat yogurt -- 1 cup (8 oz) has 8 mg. Nonfat Greek yogurt -- 1 cup (8 oz) has 7 mg. Half-and-half cream -- 1 Tbsp has 5 mg. Fats and oils Cod liver oil -- 1  tablespoon (Tbsp) has 82 mg. Butter -- 1 Tbsp has 15 mg. Lard -- 1 Tbsp has 14 mg. Bacon grease -- 1 Tbsp has 14 mg. Mayonnaise -- 1 Tbsp has 5-10 mg. Margarine -- 1 Tbsp has 3-10 mg. Exact amounts of cholesterol in these foods may vary depending on specificingredients and brands. Foods without cholesterol Most plant-based foods do not have cholesterol unless you combine them with a food that has cholesterol. Foods without cholesterol include: Grains and cereals. Vegetables. Fruits. Vegetable oils, such as olive, canola, and sunflower oil. Legumes, such as peas, beans, and lentils. Nuts and seeds. Egg whites. Summary The body needs cholesterol in small amounts, but too much cholesterol can cause damage to the arteries and heart. Most people should eat less than 200 milligrams (mg) of cholesterol a day. This information is not intended to replace advice given to you by your health care provider. Make sure you discuss any questions you have with your healthcare provider. Document Revised: 11/04/2019 Document Reviewed: 12/15/2019 Elsevier Patient Education  [place].  Consent to CCM Services: Rick Edwards was given information about Chronic Care Management services including:  CCM service includes personalized support from designated clinical staff supervised by his physician, including individualized plan of care and coordination with other care providers 24/7 contact phone numbers for assistance for urgent and routine care needs. Service  will only be billed when office clinical staff spend 20 minutes or more in a month to coordinate care. Only one practitioner may furnish and bill the service in a calendar month. The patient may stop CCM services at any time (effective at the end of the month) by phone call to the office staff. The patient will be responsible for cost sharing (co-pay) of up to 20% of the service fee (after annual deductible is met).  Patient agreed to services and verbal consent obtained.   The patient verbalized understanding of instructions, educational materials, and care plan provided today and agreed to receive a mailed copy of patient instructions, educational materials, and care plan.  Telephone follow up appointment with care management team member scheduled for:  Tuesday June 07, 2021 at 2:00 pm The patient has been provided with contact information for the care management team and has been advised to call with any health related questions or concerns.   Oneta Rack, RN, BSN, [facility] RN Care Coordination- [place] [phone]: direct office [phone]: mobile    CLINICAL CARE PLAN: Patient Care Plan: RN Care Manager Plan of Care     Problem Identified: Chronic Disease Management Needs   Priority: Medium     Long-Range Goal: Development of plan of care for long term chronic disease management   Start Date: 03/17/2021  Expected End Date: 03/17/2022  Priority: Medium  Note:   Current Barriers:  Chronic Disease Management support and education needs related to HTN and HLD  RNCM Clinical Goal(s):  Patient will demonstrate ongoing health management independence for HTN/ HLD  through collaboration with RN Care manager, provider, and care team.   Interventions: 1:1 collaboration with primary care provider regarding development and update of comprehensive plan of care as evidenced by provider attestation and co-signature Inter-disciplinary care team  collaboration (see longitudinal plan of care) Evaluation of current treatment plan related to  self management and patient's adherence to plan as established by provider  Hyperlipidemia:  (Status: New goal.) Lab Results  Component Value Date   CHOL 142 01/18/2021   HDL 33.10 (L)  01/18/2021   [place] 80 01/18/2021   LDLDIRECT 97.0 05/05/2019   TRIG 142.0 01/18/2021   CHOLHDL 4 01/18/2021  Medication review performed; medication list updated in electronic medical record- reviewed recent changes to patent's medications post- PCP office visit 7/27 Confirmed patient obtained and is taking amlodipine and torsemide as prescribed Patient reports did not have K-Chlor/ potassium included in his medications when he obtained new prescriptions: discussed with patient rationale/ importance of taking potassium Care Coordination outreach via 3-way call placed to patient's outpatient pharmacy: [place] [phone]; spoke with "Sonia Side" who confirms that this prescription was "placed on hold;" he is unable to tell me purpose of/ reason for hold status: confirmed he will fill today and will be ready for pick up today Advised patient to pick up/ start taking potassium today once he obtains medication: he verbalizes understanding and agreement Noted that patient reported currently taking chlorthalidone 25 mg QD: this is not on his current medication list: will message PCP as FYI to inform, given recent changes to medications Counseled on importance of regular laboratory monitoring as prescribed; Provided HLD educational materials; Reviewed importance of limiting foods high in cholesterol; Reviewed exercise goals and target of 150 minutes per week; Screening for signs and symptoms of depression related to chronic disease state;  Assessed social determinant of health barriers;   Hypertension: (Status: New goal.) Last practice recorded BP readings:  BP Readings from Last 3 Encounters:  03/02/21  (!) 154/96  01/24/21 (!) 144/80  01/18/21 130/72  Most recent eGFR/CrCl: No results found for: EGFR  No components found for: CRCL  Evaluation of current treatment plan related to hypertension self management and patient's adherence to plan as established by provider;   Reviewed prescribed diet heart healthy, low sodium, low cholesterol, coumadin- restricted Reviewed medications with patient and discussed importance of compliance;  Counseled on the importance of exercise goals with target of 150 minutes per week Discussed plans with patient for ongoing care management follow up and provided patient with direct contact information for care management team; Discussed complications of poorly controlled blood pressure such as heart disease, stroke, circulatory complications, vision complications, kidney impairment, sexual dysfunction;  Discussed home BP monitoring/ recording with patient: encouraged him to monitor/ write down blood pressures weekly- he is agreeable; reports family lives next door and has blood pressure cuff  Encouraged patient to keep recorded blood pressures on dedicated paper to share with PCP at next office visit Reviewed with patient upcoming provider appointments, including Coumadin clinic 04/12/21; PCP office visit 06/06/21 Discussed patient's recent addition of walking into his routine: encouraged ongoing physical activity/ exercise; discussed benefit of activity in setting of HTN/ HLD  Patient Goals/Self-Care Activities: Patient will self administer medications as prescribed Patient will attend all scheduled provider appointments Patient will call pharmacy for medication refills Patient will attend church or other social activities Patient will call provider office for new concerns or questions Patient will begin monitoring and writing down blood pressures at home each week; he will take written blood pressures form home to his PCP office visit on June 06, 2021 Patient  will continue following a heart-healthy, low salt, low cholesterol, and Coumadin-restricted diet Patient will continue to make efforts to walk for exercise on a weekly basis

## 2021-03-18 ENCOUNTER — Other Ambulatory Visit: Payer: Self-pay | Admitting: Internal Medicine

## 2021-03-22 ENCOUNTER — Ambulatory Visit: Payer: Medicare Other | Admitting: *Deleted

## 2021-03-22 DIAGNOSIS — E785 Hyperlipidemia, unspecified: Secondary | ICD-10-CM

## 2021-03-22 DIAGNOSIS — I10 Essential (primary) hypertension: Secondary | ICD-10-CM

## 2021-03-22 NOTE — Chronic Care Management (AMB) (Signed)
Chronic Care Management   CCM RN Visit Note  03/22/2021 Name: Rick Edwards MRN: [id number] DOB: 1955-11-01  Subjective: Rick Edwards is a 65 y.o. year old male who is a primary care patient of Janith Lima, MD. The care management team was consulted for assistance with disease management and care coordination needs.    Engaged with patient by telephone for  acute call  in response to provider referral for case management and/or care coordination services.   Consent to Services:  The patient was given information about Chronic Care Management services, agreed to services, and gave verbal consent prior to initiation of services.  Please see initial visit note for detailed documentation.  Patient agreed to services and verbal consent obtained.   Assessment: Review of patient past medical history, allergies, medications, health status, including review of consultants reports, laboratory and other test data, was performed as part of comprehensive evaluation and provision of chronic care management services.     CCM Care Plan Allergies  Allergen Reactions   Mesalamine Rash    REACTION: Rash   Outpatient Encounter Medications as of 03/22/2021  Medication Sig Note   allopurinol (ZYLOPRIM) 300 MG tablet Take 1 tablet (300 mg total) by mouth daily.    amLODipine (NORVASC) 10 MG tablet Take 1 tablet (10 mg total) by mouth at bedtime.    atorvastatin (LIPITOR) 40 MG tablet TAKE 1 TABLET BY MOUTH EVERY DAY    augmented betamethasone dipropionate (DIPROLENE-AF) 0.05 % cream Apply topically 2 (two) times daily. (Patient not taking: Reported on 03/17/2021)    carvedilol (COREG) 25 MG tablet TAKE 1 TABLET BY MOUTH TWICE A DAY WITH MEALS    chlorthalidone (HYGROTON) 25 MG tablet Take 25 mg by mouth daily. 03/17/2021: 03/17/21: Patient reports has been taking this medication for several years- was not on his medication list   hyoscyamine (LEVSIN SL) 0.125 MG SL tablet Place under the tongue 2  (two) times daily as needed. 03/17/2021: 03/17/21: Reports has not needed recently   icosapent Ethyl (VASCEPA) 1 g capsule Take 2 capsules (2 g total) by mouth 2 (two) times daily.    levocetirizine (XYZAL) 5 MG tablet Take 1 tablet (5 mg total) by mouth every evening. (Patient not taking: Reported on 03/17/2021)    potassium chloride (KLOR-CON 10) 10 MEQ tablet Take 1 tablet (10 mEq total) by mouth 2 (two) times daily. (Patient not taking: Reported on 03/17/2021) 03/17/2021: 03/17/21: Patient reports this medication did not come in outpatient pharmacy prescription from PCP OV 03/02/21-- care coordination outreach to pharmacy completed- prescription in progress now, patient understands to pick up and start taking today   spironolactone (ALDACTONE) 25 MG tablet Take 1 tablet (25 mg total) by mouth daily.    torsemide (DEMADEX) 20 MG tablet Take 1 tablet (20 mg total) by mouth daily.    warfarin (COUMADIN) 5 MG tablet TAKE 1.5 TABLETS DAILY, EXCEPT TAKE 1 TABLET ON MON, WED, AND FRI AS DIRECTED BY CLINIC.    [DISCONTINUED] fenofibrate (TRICOR) 145 MG tablet Take 1 tablet by mouth once daily    [DISCONTINUED] indapamide (LOZOL) 1.25 MG tablet Take 1 tablet (1.25 mg total) by mouth daily. 07/07/2019: palpitations   No facility-administered encounter medications on file as of 03/22/2021.   Patient Active Problem List   Diagnosis Date Noted   Benign prostatic hyperplasia without lower urinary tract symptoms 03/02/2021   Intrinsic eczema 01/24/2021   Chronic hypokalemia 81/27/5170   LSC (lichen simplex chronicus) 01/18/2021   Tinea  corporis 01/18/2021   Hearing loss due to cerumen impaction, bilateral 06/01/2020   Chronic gout due to renal impairment of multiple sites without tophus 06/01/2020   Hypertensive urgency, malignant 08/12/2019   Type II diabetes mellitus with manifestations ([place]) 08/17/2018   Atherosclerosis of aorta ([place]) 09/26/2017   Drug-induced erectile dysfunction 07/19/2016   Pure  hypertriglyceridemia 04/05/2016   Hyperlipidemia with target LDL less than 70 01/18/2016   Proteinuria 03/13/2014   OA (osteoarthritis) of knee 11/25/2013   PVD (peripheral vascular disease) ([place]) 09/24/2013   Encounter for therapeutic drug monitoring 09/16/2013   Kidney disease, chronic, stage III (GFR 30-59 ml/min) ([place]) 05/26/2013   Antithrombin III deficiency ([place]) 05/16/2013   Long term (current) use of anticoagulants 11/27/2012   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 ([place]) 04/10/2012   Splenic infarction 03/20/2012   Routine general medical examination at a health care facility 08/29/2011   [facility] [place] 03/19/2009   Essential hypertension, malignant 03/02/2009   Conditions to be addressed/monitored:  HTN and HLD  Care Plan : RN Care Manager Plan of Care  Updates made by Knox Royalty, RN since 03/22/2021 12:00 AM     Problem: Chronic Disease Management Needs   Priority: Medium     Long-Range Goal: Development of plan of care for long term chronic disease management   Start Date: 03/17/2021  Expected End Date: 03/17/2022  Priority: Medium  Note:   Current Barriers:  Chronic Disease Management support and education needs related to HTN and HLD  RNCM Clinical Goal(s):  Patient will demonstrate ongoing health management independence for HTN/ HLD  through collaboration with RN Care manager, provider, and care team.   Interventions: 1:1 collaboration with primary care provider regarding development and update of comprehensive plan of care as evidenced by provider attestation and co-signature Inter-disciplinary care team collaboration (see longitudinal plan of care) Evaluation of current treatment plan related to  self management and patient's adherence to plan as established by provider  03/22/21 10:35 am acute call: received off-hours voice message from patient's sister requesting call to patient to discuss current symptoms patient is  experiencing; called patient this morning- he reports non-specific symptoms (tired, general weakness at times, ringing in ears) that he states have "been going on for awhile;" he denies acute clinical issues and states he is able to drive and attended church on Sunday, eating well, appetite good, afebrile, having normal bowel movements, urinating per baseline.  States he thinks his "stomach Crohns" is causing symptoms because "that's how I felt before when it happened."  States his "stomach doctor" is on vacation. Confirmed patient is in no acute distress, basic triage screening completed Advised patient to contact GI provider to determine if associate of his provider could schedule him to be seen Advised patient to go to [facility] ED for development of any urgent/ emergent symptoms- these were discussed with patient who verbalizes understanding and denies at current time Advised patient he is welcome to schedule sooner appointment with PCP if he remains concerned about his non-specific symptoms and is unable to schedule with GI provider team Confirmed patient obtained and is taking medications as per care coordination outreach at time of initial outreach on 03/17/21 Confirmed patient has not yet started monitoring blood pressures at home- encouraged him to begin doing so, as per initial outreach 03/17/21 Confirmed patient has continued to try and walk with his brother, however, he complains of chronic knee pain, which is limiting him: encouraged him to be moderate in  activity and start slow/ not over-do, he verbalizes understanding and agreement  From 03/17/21 initial outreach:  Hyperlipidemia:  (Status: New goal.) Lab Results  Component Value Date   CHOL 142 01/18/2021   HDL 33.10 (L) 01/18/2021   LDLCALC 80 01/18/2021   LDLDIRECT 97.0 05/05/2019   TRIG 142.0 01/18/2021   CHOLHDL 4 01/18/2021  Medication review performed; medication list updated in electronic medical record- reviewed recent changes to  patent's medications post- PCP office visit 7/27 Confirmed patient obtained and is taking amlodipine and torsemide as prescribed Patient reports did not have K-Chlor/ potassium included in his medications when he obtained new prescriptions: discussed with patient rationale/ importance of taking potassium Care Coordination outreach via 3-way call placed to patient's outpatient pharmacy: [facility] [phone]; spoke with "Sonia Side" who confirms that this prescription was "placed on hold;" he is unable to tell me purpose of/ reason for hold status: confirmed he will fill today and will be ready for pick up today Advised patient to pick up/ start taking potassium today once he obtains medication: he verbalizes understanding and agreement Noted that patient reported currently taking chlorthalidone 25 mg QD: this is not on his current medication list: will message PCP as FYI to inform, given recent changes to medications Counseled on importance of regular laboratory monitoring as prescribed; Provided HLD educational materials; Reviewed importance of limiting foods high in cholesterol; Reviewed exercise goals and target of 150 minutes per week; Screening for signs and symptoms of depression related to chronic disease state;  Assessed social determinant of health barriers;   Hypertension: (Status: New goal.) Last practice recorded BP readings:  BP Readings from Last 3 Encounters:  03/02/21 (!) 154/96  01/24/21 (!) 144/80  01/18/21 130/72  Most recent eGFR/CrCl: No results found for: EGFR  No components found for: CRCL  Evaluation of current treatment plan related to hypertension self management and patient's adherence to plan as established by provider;   Reviewed prescribed diet heart healthy, low sodium, low cholesterol, coumadin- restricted Reviewed medications with patient and discussed importance of compliance;  Counseled on the importance of exercise goals with target of 150  minutes per week Discussed plans with patient for ongoing care management follow up and provided patient with direct contact information for care management team; Discussed complications of poorly controlled blood pressure such as heart disease, stroke, circulatory complications, vision complications, kidney impairment, sexual dysfunction;  Discussed home BP monitoring/ recording with patient: encouraged him to monitor/ write down blood pressures weekly- he is agreeable; reports family lives next door and has blood pressure cuff  Encouraged patient to keep recorded blood pressures on dedicated paper to share with PCP at next office visit Reviewed with patient upcoming provider appointments, including Coumadin clinic 04/12/21; PCP office visit 06/06/21 Discussed patient's recent addition of walking into his routine: encouraged ongoing physical activity/ exercise; discussed benefit of activity in setting of HTN/ HLD  Patient Goals/Self-Care Activities: Patient will self administer medications as prescribed Patient will attend all scheduled provider appointments Patient will call pharmacy for medication refills Patient will attend church or other social activities Patient will call provider office for new concerns or questions Patient will begin monitoring and writing down blood pressures at home each week; he will take written blood pressures form home to his PCP office visit on June 06, 2021 Patient will continue following a heart-healthy, low salt, low cholesterol, and Coumadin-restricted diet Patient will continue to make efforts to walk for exercise on a weekly basis  Plan: Telephone follow up appointment with care management team member scheduled for:  June 07, 2021 The patient has been provided with contact information for the care management team and has been advised to call with any health related questions or concerns.   Oneta Rack, RN, BSN, [facility] RN Care Coordination- [place] [phone]: direct office [phone]: mobile

## 2021-04-06 DIAGNOSIS — E785 Hyperlipidemia, unspecified: Secondary | ICD-10-CM

## 2021-04-06 DIAGNOSIS — I10 Essential (primary) hypertension: Secondary | ICD-10-CM | POA: Diagnosis not present

## 2021-04-12 ENCOUNTER — Other Ambulatory Visit: Payer: Self-pay

## 2021-04-12 ENCOUNTER — Ambulatory Visit (INDEPENDENT_AMBULATORY_CARE_PROVIDER_SITE_OTHER): Payer: Medicare Other

## 2021-04-12 DIAGNOSIS — Z7901 Long term (current) use of anticoagulants: Secondary | ICD-10-CM | POA: Diagnosis not present

## 2021-04-12 LAB — POCT INR: INR: 2.9 (ref 2.0–3.0)

## 2021-04-12 NOTE — Progress Notes (Signed)
Medical screening examination/treatment/procedure(s) were performed by non-physician practitioner and as supervising physician I was immediately available for consultation/collaboration. I agree with above. Yuepheng Schaller, MD   

## 2021-04-12 NOTE — Patient Instructions (Addendum)
Pre visit review using our clinic review tool, if applicable. No additional management support is needed unless otherwise documented below in the visit note.  Continue to take 1 1/2 daily except take 1 tablet on Wednesdays.  Re-check in 4 weeks. If you receive any antibiotics at the dentist please contact Larene Beach, RN,  at [phone].

## 2021-04-16 ENCOUNTER — Other Ambulatory Visit: Payer: Self-pay | Admitting: Internal Medicine

## 2021-04-16 DIAGNOSIS — M1A39X0 Chronic gout due to renal impairment, multiple sites, without tophus (tophi): Secondary | ICD-10-CM

## 2021-04-20 ENCOUNTER — Other Ambulatory Visit: Payer: Self-pay | Admitting: Internal Medicine

## 2021-04-20 DIAGNOSIS — E876 Hypokalemia: Secondary | ICD-10-CM

## 2021-04-20 DIAGNOSIS — I10 Essential (primary) hypertension: Secondary | ICD-10-CM

## 2021-05-03 DIAGNOSIS — K219 Gastro-esophageal reflux disease without esophagitis: Secondary | ICD-10-CM | POA: Diagnosis not present

## 2021-05-03 DIAGNOSIS — Z8719 Personal history of other diseases of the digestive system: Secondary | ICD-10-CM | POA: Diagnosis not present

## 2021-05-03 DIAGNOSIS — Z9049 Acquired absence of other specified parts of digestive tract: Secondary | ICD-10-CM | POA: Diagnosis not present

## 2021-05-03 DIAGNOSIS — R109 Unspecified abdominal pain: Secondary | ICD-10-CM | POA: Diagnosis not present

## 2021-05-03 DIAGNOSIS — D6859 Other primary thrombophilia: Secondary | ICD-10-CM | POA: Diagnosis not present

## 2021-05-03 DIAGNOSIS — R1013 Epigastric pain: Secondary | ICD-10-CM | POA: Diagnosis not present

## 2021-05-04 ENCOUNTER — Ambulatory Visit: Payer: Medicare Other | Admitting: Family Medicine

## 2021-05-04 NOTE — Progress Notes (Deleted)
Rick Edwards - 65 y.o. male MRN [id number]  Date of birth: 14-Dec-1955  SUBJECTIVE:  Including CC & ROS.  No chief complaint on file.   Rick Edwards is a 65 y.o. male that is  ***.  ***   Review of Systems See HPI   HISTORY: Past Medical, Surgical, Social, and Family History Reviewed & Updated per EMR.   Pertinent Historical Findings include:  Past Medical History:  Diagnosis Date   Anemia    anemia thrombocytopenia, saw Hematology 2011, can not r/o myeloproliferative d/c   Antithrombin III deficiency ([place]) 05/16/2013   On coumadin for this   Blood clot in vein    in right leg   Chronic kidney disease 05/27/2013   ACUTE RENAL FAILURE    Crohn's disease ([place])    tx. Imuran   GI bleed 6/11   w/ normal EGD 6/11, 3 PRBCs    Hearing loss    wears hearing aid left ear; birthed with hearing loss   HOH (hard of hearing) 05/16/2013   Hypercalcemia    Hyperkalemia 05/27/2013   Hypertension    Ileostomy in place [facility]) 04-11-13   04-18-13 ileostomy to be taken down.   Perianal abscess 2001   s/p right hemicolectomy, and drainage of retroperitoneal abcess 2003   Peripheral vascular disease ([place])    Transfusion history    last 8'13    Past Surgical History:  Procedure Laterality Date   Anal fistulotomy  2002   EMBOLECTOMY  03/12/2012   Procedure: EMBOLECTOMY;  Surgeon: Angelia Mould, MD;  Location: [facility];  Service: Vascular;  Laterality: Right;  Right popliteal embolectomy with vein patch angioplasty, right posterior tibial embolectomy with vein patch angioplasty    HEMICOLECTOMY  08/29/2001   perforated abscess   ILEOSTOMY CLOSURE N/A 04/18/2013   Procedure: ILEOSTOMY TAKEDOWN;  Surgeon: Edward Jolly, MD;  Location: [facility];  Service: General;  Laterality: N/A;   INTRAOPERATIVE ARTERIOGRAM  03/12/2012   Procedure: INTRA OPERATIVE ARTERIOGRAM;  Surgeon: Angelia Mould, MD;  Location: [facility];  Service: Vascular;  Laterality: Right;   PARTIAL COLECTOMY   03/11/2012   Procedure: PARTIAL COLECTOMY;  Surgeon: Edward Jolly, MD;  Location: [facility];  Service: General;  Laterality: N/A;  subtotal colectomy transverse and left colon    TRANSMETATARSAL AMPUTATION  05/10/2012   right    Family History  Problem Relation Age of Onset   Hypertension Mother    Hyperlipidemia Mother    Hypertension Father    Hyperlipidemia Father    Heart disease Father    Stroke Other        GF, aunts    Hypertension Other        "the whole family"   Coronary artery disease Neg Hx    Diabetes Neg Hx    Colon cancer Neg Hx    Prostate cancer Neg Hx    Kidney failure Neg Hx     Social History   Socioeconomic History   Marital status: Single    Spouse name: Not on file   Number of children: Not on file   Years of education: Not on file   Highest education level: Not on file  Occupational History   Occupation: Post office   Tobacco Use   Smoking status: Former    Packs/day: 0.25    Years: 9.00    Pack years: 2.25    Types: Cigarettes    Quit date: 05/09/2011    Years since quitting:  9.9   Smokeless tobacco: Never  Substance and Sexual Activity   Alcohol use: No    Alcohol/week: 0.0 standard drinks   Drug use: Yes    Types: Marijuana   Sexual activity: Not Currently  Other Topics Concern   Not on file  Social History Narrative   Lives by himself   Regular exercise- no          Social Determinants of Health   Financial Resource Strain: Low Risk    Difficulty of Paying Living Expenses: Not hard at all  Food Insecurity: No Food Insecurity   Worried About Charity fundraiser in the Last Year: Never true   Ran Out of Food in the Last Year: Never true  Transportation Needs: No Transportation Needs   Lack of Transportation (Medical): No   Lack of Transportation (Non-Medical): No  Physical Activity: Inactive   Days of Exercise per Week: 0 days   Minutes of Exercise per Session: 0 min  Stress: No Stress Concern Present   Feeling of  Stress : Not at all  Social Connections: Moderately Integrated   Frequency of Communication with Friends and Family: More than three times a week   Frequency of Social Gatherings with Friends and Family: More than three times a week   Attends Religious Services: More than 4 times per year   Active Member of [employer] or Organizations: No   Attends Music therapist: More than 4 times per year   Marital Status: Never married  Human resources officer Violence: Not on file     PHYSICAL EXAM:  VS: There were no vitals taken for this visit. Physical Exam Gen: NAD, alert, cooperative with exam, well-appearing MSK:  ***      ASSESSMENT & PLAN:   No problem-specific Assessment & Plan notes found for this encounter.

## 2021-05-10 ENCOUNTER — Telehealth: Payer: Self-pay

## 2021-05-10 ENCOUNTER — Ambulatory Visit (INDEPENDENT_AMBULATORY_CARE_PROVIDER_SITE_OTHER): Payer: Medicare Other

## 2021-05-10 ENCOUNTER — Other Ambulatory Visit: Payer: Self-pay

## 2021-05-10 DIAGNOSIS — Z7901 Long term (current) use of anticoagulants: Secondary | ICD-10-CM

## 2021-05-10 DIAGNOSIS — I743 Embolism and thrombosis of arteries of the lower extremities: Secondary | ICD-10-CM

## 2021-05-10 LAB — POCT INR: INR: 3.4 — AB (ref 2.0–3.0)

## 2021-05-10 NOTE — Telephone Encounter (Addendum)
Pt scheduled for endoscopy at [facility].  Pt will need to hold coumadin for 5 days and will need a lovenox bridge.  Coumadin and lovenox bridge dosing. Pt wt, 99.8kg;  CrCl, 163mL/min Lovenox 150mg  QD  10/7: Take last dose of coumadin 10/8: No coumadin, no lovenox 10/9: No coumadin, lovenox AM 10/10: No coumadin, lovenox AM 10/11: No coumadin, lovenox AM, before 7AM  10/12: Surgery(Endoscopy) NO COUMADIN, NO LOVENOX  10/13: Take 2 1/2 tablets (12.5mg ) coumadin, lovenox AM 10/14: Take 2 1/2 tablets (12.5mg ) coumadin, lovenox AM 10/15: Take 2 tablets (10mg ) coumadin, lovenox AM 10/16: Take 2 tablets (10mg ) coumadin, lovenox AM 10/17: Take 1 1/2 tablets (7.5mg ) coumadin, lovenox AM 10/18 Recheck INR, do NOT take any coumadin until after INR check

## 2021-05-10 NOTE — Progress Notes (Signed)
I have reviewed and agree.

## 2021-05-10 NOTE — Patient Instructions (Addendum)
Pre visit review using our clinic review tool, if applicable. No additional management support is needed unless otherwise documented below in the visit note.  Hold dose today and then change weekly dose to take 1 1/2 daily except take 1 tablet on Mondays and Thursdays.  Re-check in 4 weeks. If you receive any antibiotics at the dentist please contact Larene Beach, RN,  at [phone].

## 2021-05-11 NOTE — Telephone Encounter (Addendum)
Per Dr. Ronnald Ramp, coumadin/lovenox bridge authorized to proceed.   Contacted pt and advised of bridge. He would like to come to clinic for directions on dosing and lovenox bridge. He would like lovenox sent to [facility] on [address] today. He will come tomorrow at [facility] at 2pm for instructions.

## 2021-05-12 ENCOUNTER — Ambulatory Visit: Payer: Medicare Other

## 2021-05-12 ENCOUNTER — Other Ambulatory Visit: Payer: Self-pay

## 2021-05-12 DIAGNOSIS — Z7901 Long term (current) use of anticoagulants: Secondary | ICD-10-CM

## 2021-05-12 MED ORDER — ENOXAPARIN SODIUM 150 MG/ML IJ SOSY
150.0000 mg | PREFILLED_SYRINGE | INTRAMUSCULAR | 0 refills | Status: DC
Start: 2021-05-12 — End: 2022-01-13

## 2021-05-12 NOTE — Patient Instructions (Signed)
Pre visit review using our clinic review tool, if applicable. No additional management support is needed unless otherwise documented below in the visit note.  10/7: Take last dose of coumadin 10/8: No coumadin, no lovenox 10/9: No coumadin, lovenox AM 10/10: No coumadin, lovenox AM 10/11: No coumadin, lovenox AM, before 7AM   10/12: Surgery(Endoscopy) NO COUMADIN, NO LOVENOX   10/13: Take 2 1/2 tablets (12.5mg ) coumadin in evening, lovenox AM 10/14: Take 2 1/2 tablets (12.5mg ) coumadin in evening, lovenox AM 10/15: Take 2 tablets (10mg ) coumadin in evening, lovenox AM 10/16: Take 2 tablets (10mg ) coumadin in evening, lovenox AM 10/17: Take 1 1/2 tablets (7.5mg ) coumadin in evening, lovenox AM 10/18 Recheck INR, do NOT take any coumadin until after INR check

## 2021-05-16 ENCOUNTER — Telehealth: Payer: Self-pay

## 2021-05-16 DIAGNOSIS — I743 Embolism and thrombosis of arteries of the lower extremities: Secondary | ICD-10-CM

## 2021-05-16 DIAGNOSIS — Z7901 Long term (current) use of anticoagulants: Secondary | ICD-10-CM

## 2021-05-16 MED ORDER — WARFARIN SODIUM 5 MG PO TABS: ORAL_TABLET | ORAL | 1 refills | Status: DC

## 2021-05-16 NOTE — Telephone Encounter (Signed)
Pt LVM that he would be out of coumadin in 2 days. Pt compliant with coumadin management. Sent in refill. Contacted pt and advised refill has been sent. Pt verbalized understanding.

## 2021-05-18 DIAGNOSIS — K222 Esophageal obstruction: Secondary | ICD-10-CM | POA: Diagnosis not present

## 2021-05-18 DIAGNOSIS — K449 Diaphragmatic hernia without obstruction or gangrene: Secondary | ICD-10-CM | POA: Diagnosis not present

## 2021-05-18 DIAGNOSIS — D175 Benign lipomatous neoplasm of intra-abdominal organs: Secondary | ICD-10-CM | POA: Diagnosis not present

## 2021-05-18 DIAGNOSIS — R12 Heartburn: Secondary | ICD-10-CM | POA: Diagnosis not present

## 2021-05-18 DIAGNOSIS — K2930 Chronic superficial gastritis without bleeding: Secondary | ICD-10-CM | POA: Diagnosis not present

## 2021-05-24 ENCOUNTER — Ambulatory Visit (INDEPENDENT_AMBULATORY_CARE_PROVIDER_SITE_OTHER): Payer: Medicare Other

## 2021-05-24 ENCOUNTER — Other Ambulatory Visit: Payer: Self-pay

## 2021-05-24 DIAGNOSIS — Z23 Encounter for immunization: Secondary | ICD-10-CM | POA: Diagnosis not present

## 2021-05-24 DIAGNOSIS — Z7901 Long term (current) use of anticoagulants: Secondary | ICD-10-CM | POA: Diagnosis not present

## 2021-05-24 LAB — POCT INR: INR: 2 (ref 2.0–3.0)

## 2021-05-24 NOTE — Progress Notes (Signed)
Medical screening examination/treatment/procedure(s) were performed by non-physician practitioner and as supervising physician I was immediately available for consultation/collaboration. I agree with above. Prudy Candy, MD   

## 2021-05-24 NOTE — Patient Instructions (Addendum)
Pre visit review using our clinic review tool, if applicable. No additional management support is needed unless otherwise documented below in the visit note.  Continue 1 1/2 tablets daily except take 1 tablet on Mondays and Thursdays. Recheck in 4 wks.

## 2021-05-26 DIAGNOSIS — K2930 Chronic superficial gastritis without bleeding: Secondary | ICD-10-CM | POA: Diagnosis not present

## 2021-05-30 ENCOUNTER — Other Ambulatory Visit: Payer: Self-pay | Admitting: Internal Medicine

## 2021-05-30 DIAGNOSIS — I10 Essential (primary) hypertension: Secondary | ICD-10-CM

## 2021-06-06 ENCOUNTER — Ambulatory Visit: Payer: Medicare Other | Admitting: Internal Medicine

## 2021-06-07 ENCOUNTER — Encounter: Payer: Self-pay | Admitting: *Deleted

## 2021-06-07 ENCOUNTER — Other Ambulatory Visit: Payer: Self-pay | Admitting: Internal Medicine

## 2021-06-07 ENCOUNTER — Other Ambulatory Visit: Payer: Self-pay

## 2021-06-07 ENCOUNTER — Ambulatory Visit (INDEPENDENT_AMBULATORY_CARE_PROVIDER_SITE_OTHER): Payer: Medicare Other | Admitting: *Deleted

## 2021-06-07 ENCOUNTER — Ambulatory Visit: Payer: Medicare Other

## 2021-06-07 DIAGNOSIS — I10 Essential (primary) hypertension: Secondary | ICD-10-CM

## 2021-06-07 DIAGNOSIS — E785 Hyperlipidemia, unspecified: Secondary | ICD-10-CM

## 2021-06-07 NOTE — Patient Instructions (Addendum)
Visit Information  Rick Edwards, it was nice talking with you today.    I look forward to talking to you again for a telephone update on Tuesday, July 19, 2021 at 1:45 pm- please be listening out for my phone call that day.  I will call as close to 1:45 pm as possible.   If you need to cancel or re-schedule our telephone visit, please call [phone] and one of our care guides will be happy to assist you.  Please see your scheduled appointment list at the end of this paperwork; as a reminder, your upcoming appointments are as follows:   - Coumadin clinic visit on June 21, 2021 at 9:00 am   - you have an in-person office visit with Dr. Ronnald Ramp on Monday, July 11, 2021 at 3:20 pm- please be at the office at 3:00 pm and bring your medications with you  - my next appointment with you is for a telephone call on Tuesday, July 19, 2021 at 1:45 pm-- if you receive a reminder message that this appointment is an in-person office visit-- please disregard the message.... the July 19, 2021 appointment is for a telephone visit and will not change to an in-person visit.  Please write all of these appointments down on your paper calendar.   I look forward to hearing about your progress.   Please don't hesitate to contact me if I can be of assistance to you before our next scheduled telephone appointment.   Oneta Rack, RN, BSN, [facility] RN Care Coordination- [place] [phone]: direct office [phone]: mobile   Patient Self-Care Activities: Patient Rick Edwards will:  Self administer medications as prescribed Attend all scheduled provider appointments Call pharmacy for medication refills Attend church or other social activities Provider office for new concerns or questions Continue checking and writing down blood pressures at home each week; he will take written blood pressures form home to his PCP office visit on July 11, 2021 Will continue  following a heart-healthy, low salt, low cholesterol, and Coumadin-restricted diet Will continue to make efforts to walk for exercise on a weekly basis  Hypotension-- Low Blood Pressure As your heart beats, it forces blood through your body. This force is called blood pressure. If you have hypotension, you have low blood pressure. When your blood pressure is too low, you may not get enough blood to your brain or other parts of your body. This may cause you to feel weak, light-headed, have a fast heartbeat, or even pass out (faint). Low blood pressure may be harmless, or it may cause serious problems. What are the causes? Blood loss. Not enough water in the body (dehydration). Heart problems. Hormone problems. Pregnancy. A very bad infection. Not having enough of certain nutrients. Very bad allergic reactions. Certain medicines. What increases the risk? Age. The risk increases as you get older. Conditions that affect the heart or the brain and spinal cord (central nervous system). Taking certain medicines. Being pregnant. What are the signs or symptoms? Feeling: Weak. Light-headed. Dizzy. Tired (fatigued). Blurred vision. Fast heartbeat. Passing out, in very bad cases. How is this treated? Changing your diet. This may involve eating more salt (sodium) or drinking more water. Taking medicines to raise your blood pressure. Changing how much you take (the dosage) of some of your medicines. Wearing compression stockings. These stockings help to prevent blood clots and reduce swelling in your legs. In some cases, you may need to go to the hospital for: Fluid  replacement. This means you will receive fluids through an IV tube. Blood replacement. This means you will receive donated blood through an IV tube (transfusion). Treating an infection or heart problems, if this applies. Monitoring. You may need to be monitored while medicines that you are taking wear off. Follow these  instructions at home: Eating and drinking  Drink enough fluids to keep your pee (urine) pale yellow. Eat a healthy diet. Follow instructions from your doctor about what you can eat or drink. A healthy diet includes: Fresh fruits and vegetables. Whole grains. Low-fat (lean) meats. Low-fat dairy products. Eat extra salt only as told. Do not add extra salt to your diet unless your doctor tells you to. Eat small meals often. Avoid standing up quickly after you eat. Medicines Take over-the-counter and prescription medicines only as told by your doctor. Follow instructions from your doctor about changing how much you take of your medicines, if this applies. Do not stop or change any of your medicines on your own. General instructions  Wear compression stockings as told by your doctor. Get up slowly from lying down or sitting. Avoid hot showers and a lot of heat as told by your doctor. Return to your normal activities as told by your doctor. Ask what activities are safe for you. Do not use any products that contain nicotine or tobacco, such as cigarettes, e-cigarettes, and chewing tobacco. If you need help quitting, ask your doctor. Keep all follow-up visits as told by your doctor. This is important. Contact a doctor if: You throw up (vomit). You have watery poop (diarrhea). You have a fever for more than 2-3 days. You feel more thirsty than normal. You feel weak and tired. Get help right away if: You have chest pain. You have a fast or uneven heartbeat. You lose feeling (have numbness) in any part of your body. You cannot move your arms or your legs. You have trouble talking. You get sweaty or feel light-headed. You pass out. You have trouble breathing. You have trouble staying awake. You feel mixed up (confused). Summary Hypotension is also called low blood pressure. It is when the force of blood pumping through your arteries is too weak. Hypotension may be harmless, or it may  cause serious problems. Treatment may include changing your diet and medicines, and wearing compression stockings. In very bad cases, you may need to go to the hospital. This information is not intended to replace advice given to you by your health care provider. Make sure you discuss any questions you have with your health care provider. Document Revised: 01/17/2018 Document Reviewed: 01/17/2018 Elsevier Patient Education  2022 [employer].  The patient verbalized understanding of instructions, educational materials, and care plan provided today and agreed to receive a mailed copy of patient instructions, educational materials, and care plan Telephone follow up appointment with care management team member scheduled for: Tuesday July 19, 2021 at 1:45 pm The patient has been provided with contact information for the care management team and has been advised to call with any health related questions or concerns  .Oneta Rack, RN, BSN, [facility] RN Care Coordination- [place] [phone]: direct office [phone]: mobile

## 2021-06-07 NOTE — Chronic Care Management (AMB) (Signed)
Chronic Care Management   CCM RN Visit Note  06/07/2021 Name: Rick Edwards MRN: [id number] DOB: Jul 22, 1956  Subjective: Rick Edwards is a 65 y.o. year old male who is a primary care patient of Janith Lima, MD. The care management team was consulted for assistance with disease management and care coordination needs.    Engaged with patient by telephone for follow up visit in response to provider referral for case management and/or care coordination services.   Consent to Services:  The patient was given information about Chronic Care Management services, agreed to services, and gave verbal consent prior to initiation of services.  Please see initial visit note for detailed documentation.  Patient agreed to services and verbal consent obtained.   Assessment: Review of patient past medical history, allergies, medications, health status, including review of consultants reports, laboratory and other test data, was performed as part of comprehensive evaluation and provision of chronic care management services.  CCM Care Plan  Allergies  Allergen Reactions   Mesalamine Rash    REACTION: Rash   Outpatient Encounter Medications as of 06/07/2021  Medication Sig Note   allopurinol (ZYLOPRIM) 300 MG tablet TAKE 1 TABLET BY MOUTH EVERY DAY    amLODipine (NORVASC) 10 MG tablet Take 1 tablet (10 mg total) by mouth at bedtime.    atorvastatin (LIPITOR) 40 MG tablet TAKE 1 TABLET BY MOUTH EVERY DAY    augmented betamethasone dipropionate (DIPROLENE-AF) 0.05 % cream Apply topically 2 (two) times daily. (Patient not taking: Reported on 03/17/2021)    carvedilol (COREG) 25 MG tablet TAKE 1 TABLET BY MOUTH TWICE A DAY WITH MEALS    chlorthalidone (HYGROTON) 25 MG tablet Take 25 mg by mouth daily. 03/17/2021: 03/17/21: Patient reports has been taking this medication for several years- was not on his medication list   enoxaparin (LOVENOX) 150 MG/ML injection Inject 1 mL (150 mg total) into the skin  daily. Inject 23mL into skin the daily as directed by anticoagulation clinic.    hyoscyamine (LEVSIN SL) 0.125 MG SL tablet Place under the tongue 2 (two) times daily as needed. 03/17/2021: 03/17/21: Reports has not needed recently   icosapent Ethyl (VASCEPA) 1 g capsule Take 2 capsules (2 g total) by mouth 2 (two) times daily.    levocetirizine (XYZAL) 5 MG tablet Take 1 tablet (5 mg total) by mouth every evening. (Patient not taking: Reported on 03/17/2021)    potassium chloride (KLOR-CON 10) 10 MEQ tablet Take 1 tablet (10 mEq total) by mouth 2 (two) times daily. (Patient not taking: Reported on 03/17/2021) 03/17/2021: 03/17/21: Patient reports this medication did not come in outpatient pharmacy prescription from PCP OV 03/02/21-- care coordination outreach to pharmacy completed- prescription in progress now, patient understands to pick up and start taking today   spironolactone (ALDACTONE) 25 MG tablet TAKE 1 TABLET (25 MG TOTAL) BY MOUTH DAILY.    torsemide (DEMADEX) 20 MG tablet TAKE 1 TABLET BY MOUTH EVERY DAY    warfarin (COUMADIN) 5 MG tablet TAKE 1 1/2 TABLETS BY MOUTH DAILY EXCEPT TAKE 1 TABLET ON MONDAYS AND THURSDAYS OR AS DIRECTED BY ANTICOAGULATION CLINIC    [DISCONTINUED] fenofibrate (TRICOR) 145 MG tablet Take 1 tablet by mouth once daily    [DISCONTINUED] indapamide (LOZOL) 1.25 MG tablet Take 1 tablet (1.25 mg total) by mouth daily. 07/07/2019: palpitations   No facility-administered encounter medications on file as of 06/07/2021.   Patient Active Problem List   Diagnosis Date Noted   Benign prostatic hyperplasia  without lower urinary tract symptoms 03/02/2021   Intrinsic eczema 01/24/2021   Chronic hypokalemia 58/52/7782   LSC (lichen simplex chronicus) 01/18/2021   Tinea corporis 01/18/2021   Hearing loss due to cerumen impaction, bilateral 06/01/2020   Chronic gout due to renal impairment of multiple sites without tophus 06/01/2020   Hypertensive urgency, malignant 08/12/2019    Type II diabetes mellitus with manifestations ([place]) 08/17/2018   Atherosclerosis of aorta ([place]) 09/26/2017   Drug-induced erectile dysfunction 07/19/2016   Pure hypertriglyceridemia 04/05/2016   Hyperlipidemia with target LDL less than 70 01/18/2016   Proteinuria 03/13/2014   OA (osteoarthritis) of knee 11/25/2013   PVD (peripheral vascular disease) ([place]) 09/24/2013   Encounter for therapeutic drug monitoring 09/16/2013   Kidney disease, chronic, stage III (GFR 30-59 ml/min) ([place]) 05/26/2013   Antithrombin III deficiency ([place]) 05/16/2013   Long term (current) use of anticoagulants 11/27/2012   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 ([place]) 04/10/2012   Splenic infarction 03/20/2012   Routine general medical examination at a health care facility 08/29/2011   [facility] [place] 03/19/2009   Essential hypertension, malignant 03/02/2009   Conditions to be addressed/monitored: HTN and HLD  Care Plan : RN Care Manager Plan of Care  Updates made by Rick Royalty, RN since 06/07/2021 12:00 AM     Problem: Chronic Disease Management Needs   Priority: Medium     Long-Range Goal: Development of plan of care for long term chronic disease management   Start Date: 03/17/2021  Expected End Date: 03/17/2022  This Visit's Progress: On track  Priority: Medium  Note:   Current Barriers:  Chronic Disease Management support and education needs related to HTN and HLD Limited health literacy-- requires ongoing reinforcement/ extra time in conversation  RNCM Clinical Goal(s):  Patient will demonstrate ongoing health management independence for HTN/ HLD  through collaboration with RN Care manager, provider, and care team.   Interventions: 1:1 collaboration with primary care provider regarding development and update of comprehensive plan of care as evidenced by provider attestation and co-signature Inter-disciplinary care team collaboration (see longitudinal plan of  care) Evaluation of current treatment plan related to  self management and patient's adherence to plan as established by provider  Scheduled telephone visit completed: patient tells me that he is currently at the PCP office; states his reminder appointment for our telephone visit instructed him to come in to office for appointment; patient is upset and frustrated about this confusion; I apologized several and made CCM leadership aware of this issue with scheduling reminders for telephone visits  Hyperlipidemia:  (Status: Goal on Track (progressing): YES.) Lab Results  Component Value Date   CHOL 142 01/18/2021   HDL 33.10 (L) 01/18/2021   Vivian 80 01/18/2021   LDLDIRECT 97.0 05/05/2019   TRIG 142.0 01/18/2021   CHOLHDL 4 01/18/2021  Reviewed importance of limiting foods high in cholesterol; Reviewed exercise goals and target of 150 minutes per week;  Hypertension: (Status: Goal on Track (progressing): YES.) Last practice recorded BP readings:  BP Readings from Last 3 Encounters:  03/02/21 (!) 154/96  01/24/21 (!) 144/80  01/18/21 130/72  Most recent eGFR/CrCl: No results found for: EGFR  No components found for: CRCL  Evaluation of current treatment plan related to hypertension self management and patient's adherence to plan as established by provider;   Reviewed prescribed diet heart healthy, low sodium, low cholesterol, coumadin- restricted Counseled on the importance of exercise goals with target of 150 minutes per week Discussed plans  with patient for ongoing care management follow up and provided patient with direct contact information for care management team; Discussed complications of poorly controlled blood pressure such as heart disease, stroke, circulatory complications, vision complications, kidney impairment, sexual dysfunction;  Confirmed patient has started checking and writing down on paper blood pressures at home- positive reinforcement provided Reviewed blood  pressures at home: he reports "they are all real low;" stated general ranges consistently between 96-114/ 70-80; stated on one day when blood pressure was < 010 systolic, he "felt awful;" stated he does not "feel bad most days, but "I didn't feel good at all that day" Denies episodes of dizziness; we discussed signs/ symptoms low blood pressure and corresponding action plan- will provide additional printed educational material Appears patient missed scheduled office visit with PCP for 06/06/21-- I encouraged him to re-schedule this visit promptly, and assisted him in doing so while he was physically present at office today: next PCP appointment: 07/11/21 Denies medication concerns today: he previously reported that he was on chlorthalidone 03/17/21-- this was not on his medication list at that time: I made PCP aware of same on 03/17/21-- will re-message PCP, as I did not receive a response back from original 03/17/21 message; patient does not have medications with him today and I am unable to determine if he is still taking this medication Confirmed patient has continued trying to stay active and walking for exercise- positive reinforcement provided Reviewed with patient upcoming provider appointments, including Coumadin clinic 06/21/21; PCP office visit 07/11/21 Discussed patient's recent addition of walking into his routine: encouraged ongoing physical activity/ exercise; discussed benefit of activity in setting of HTN/ HLD  Patient Goals/Self-Care Activities: Patient Rick Edwards will:  Self administer medications as prescribed Attend all scheduled provider appointments Call pharmacy for medication refills Attend church or other social activities Provider office for new concerns or questions Continue checking and writing down blood pressures at home each week; he will take written blood pressures from home to his PCP office visit on July 11, 2021 Will continue following a heart-healthy, low salt, low  cholesterol, and Coumadin-restricted diet Will continue to make efforts to walk for exercise on a weekly basis    Plan: Telephone follow up appointment with care management team member scheduled for:  July 19, 2021 at 1:45 pm The patient has been provided with contact information for the care management team and has been advised to call with any health related questions or concerns  Rick Rack, RN, BSN, [place] [phone]: direct office [phone]: mobile

## 2021-06-12 ENCOUNTER — Other Ambulatory Visit: Payer: Self-pay | Admitting: Internal Medicine

## 2021-06-12 DIAGNOSIS — E876 Hypokalemia: Secondary | ICD-10-CM

## 2021-06-12 DIAGNOSIS — I10 Essential (primary) hypertension: Secondary | ICD-10-CM

## 2021-06-13 ENCOUNTER — Other Ambulatory Visit: Payer: Self-pay | Admitting: Internal Medicine

## 2021-06-13 DIAGNOSIS — E785 Hyperlipidemia, unspecified: Secondary | ICD-10-CM

## 2021-06-21 ENCOUNTER — Ambulatory Visit (INDEPENDENT_AMBULATORY_CARE_PROVIDER_SITE_OTHER): Payer: Medicare Other

## 2021-06-21 ENCOUNTER — Other Ambulatory Visit: Payer: Self-pay

## 2021-06-21 DIAGNOSIS — Z7901 Long term (current) use of anticoagulants: Secondary | ICD-10-CM

## 2021-06-21 DIAGNOSIS — I743 Embolism and thrombosis of arteries of the lower extremities: Secondary | ICD-10-CM | POA: Diagnosis not present

## 2021-06-21 LAB — POCT INR: INR: 2.8 (ref 2.0–3.0)

## 2021-06-21 NOTE — Patient Instructions (Addendum)
Pre visit review using our clinic review tool, if applicable. No additional management support is needed unless otherwise documented below in the visit note.  Continue 1 1/2 tablets daily except take 1 tablet on Mondays and Thursdays. Recheck in 6 wks.

## 2021-06-21 NOTE — Progress Notes (Signed)
Continue 1 1/2 tablets daily except take 1 tablet on Mondays and Thursdays. Recheck in 6 wks.

## 2021-07-06 ENCOUNTER — Telehealth: Payer: Self-pay

## 2021-07-06 DIAGNOSIS — I10 Essential (primary) hypertension: Secondary | ICD-10-CM | POA: Diagnosis not present

## 2021-07-06 DIAGNOSIS — E785 Hyperlipidemia, unspecified: Secondary | ICD-10-CM

## 2021-07-06 NOTE — Telephone Encounter (Addendum)
Pt reports he needs to RS his 12/27 coumadin clinic apt because he will be out of town from 12/22 to 12/30.  RS pt for 1/3 at 10am. Pt verbalized understanding.

## 2021-07-07 ENCOUNTER — Other Ambulatory Visit: Payer: Self-pay | Admitting: Internal Medicine

## 2021-07-07 DIAGNOSIS — E781 Pure hyperglyceridemia: Secondary | ICD-10-CM

## 2021-07-11 ENCOUNTER — Encounter: Payer: Self-pay | Admitting: Internal Medicine

## 2021-07-11 ENCOUNTER — Ambulatory Visit (INDEPENDENT_AMBULATORY_CARE_PROVIDER_SITE_OTHER): Payer: Medicare Other | Admitting: Internal Medicine

## 2021-07-11 ENCOUNTER — Other Ambulatory Visit: Payer: Self-pay

## 2021-07-11 VITALS — BP 120/80 | HR 76 | Temp 98.5°F | Ht 69.0 in | Wt 215.0 lb

## 2021-07-11 DIAGNOSIS — H6123 Impacted cerumen, bilateral: Secondary | ICD-10-CM | POA: Diagnosis not present

## 2021-07-11 DIAGNOSIS — I743 Embolism and thrombosis of arteries of the lower extremities: Secondary | ICD-10-CM

## 2021-07-11 DIAGNOSIS — N522 Drug-induced erectile dysfunction: Secondary | ICD-10-CM

## 2021-07-11 DIAGNOSIS — N1831 Chronic kidney disease, stage 3a: Secondary | ICD-10-CM

## 2021-07-11 DIAGNOSIS — E118 Type 2 diabetes mellitus with unspecified complications: Secondary | ICD-10-CM

## 2021-07-11 DIAGNOSIS — I10 Essential (primary) hypertension: Secondary | ICD-10-CM

## 2021-07-11 DIAGNOSIS — E785 Hyperlipidemia, unspecified: Secondary | ICD-10-CM

## 2021-07-11 DIAGNOSIS — E876 Hypokalemia: Secondary | ICD-10-CM

## 2021-07-11 DIAGNOSIS — Z23 Encounter for immunization: Secondary | ICD-10-CM

## 2021-07-11 MED ORDER — SPIRONOLACTONE 25 MG PO TABS
25.0000 mg | ORAL_TABLET | Freq: Every day | ORAL | 0 refills | Status: DC
Start: 2021-07-11 — End: 2021-10-07

## 2021-07-11 MED ORDER — SILDENAFIL CITRATE 100 MG PO TABS: 100.0000 mg | ORAL_TABLET | ORAL | 3 refills | Status: DC | PRN

## 2021-07-11 MED ORDER — TORSEMIDE 20 MG PO TABS: 20.0000 mg | ORAL_TABLET | Freq: Every day | ORAL | 0 refills | Status: DC

## 2021-07-11 MED ORDER — AMLODIPINE BESYLATE 10 MG PO TABS: 10.0000 mg | ORAL_TABLET | Freq: Every day | ORAL | 1 refills | Status: DC

## 2021-07-11 MED ORDER — SHINGRIX 50 MCG/0.5ML IM SUSR: 0.5000 mL | Freq: Once | INTRAMUSCULAR | 1 refills | Status: AC

## 2021-07-11 MED ORDER — ATORVASTATIN CALCIUM 40 MG PO TABS: 40.0000 mg | ORAL_TABLET | Freq: Every day | ORAL | 0 refills | Status: DC

## 2021-07-11 MED ORDER — POTASSIUM CHLORIDE ER 10 MEQ PO TBCR: 10.0000 meq | EXTENDED_RELEASE_TABLET | Freq: Two times a day (BID) | ORAL | 0 refills | Status: DC

## 2021-07-11 NOTE — Progress Notes (Signed)
Patient consent obtained. Irrigation with water and peroxide performed. Full view of tympanic membranes after procedure.  Patient tolerated procedure well.   

## 2021-07-11 NOTE — Patient Instructions (Signed)

## 2021-07-11 NOTE — Progress Notes (Signed)
Subjective:  Patient ID: Rick Edwards, male    DOB: Dec 03, 1955  Age: 65 y.o. MRN: [id number]  CC: Hypertension  This visit occurred during the SARS-CoV-2 public health emergency.  Safety protocols were in place, including screening questions prior to the visit, additional usage of staff PPE, and extensive cleaning of exam room while observing appropriate contact time as indicated for disinfecting solutions.    HPI Rick Edwards presents for f/up -  He is wearing his hearing aids but for the last few weeks he complains of a decreased level of hearing and discomfort in both ears.  He tells me his blood pressure is well controlled and he denies dizziness, lightheadedness, chest pain, shortness of breath, palpitations, or edema.  Outpatient Medications Prior to Visit  Medication Sig Dispense Refill   allopurinol (ZYLOPRIM) 300 MG tablet TAKE 1 TABLET BY MOUTH EVERY DAY 90 tablet 0   augmented betamethasone dipropionate (DIPROLENE-AF) 0.05 % cream Apply topically 2 (two) times daily. 50 g 1   carvedilol (COREG) 25 MG tablet TAKE 1 TABLET BY MOUTH TWICE A DAY WITH MEALS 180 tablet 1   enoxaparin (LOVENOX) 150 MG/ML injection Inject 1 mL (150 mg total) into the skin daily. Inject 8mL into skin the daily as directed by anticoagulation clinic. 8 mL 0   hyoscyamine (LEVSIN SL) 0.125 MG SL tablet Place under the tongue 2 (two) times daily as needed.     levocetirizine (XYZAL) 5 MG tablet Take 1 tablet (5 mg total) by mouth every evening. 90 tablet 1   VASCEPA 1 g capsule TAKE 2 CAPSULES (2 G TOTAL) BY MOUTH 2 (TWO) TIMES DAILY. 360 capsule 1   warfarin (COUMADIN) 5 MG tablet TAKE 1 1/2 TABLETS BY MOUTH DAILY EXCEPT TAKE 1 TABLET ON MONDAYS AND THURSDAYS OR AS DIRECTED BY ANTICOAGULATION CLINIC 120 tablet 1   amLODipine (NORVASC) 10 MG tablet Take 1 tablet (10 mg total) by mouth at bedtime. 90 tablet 1   atorvastatin (LIPITOR) 40 MG tablet TAKE 1 TABLET BY MOUTH EVERY DAY 90 tablet 0    chlorthalidone (HYGROTON) 25 MG tablet Take 25 mg by mouth daily.     potassium chloride (KLOR-CON) 10 MEQ tablet TAKE 1 TABLET BY MOUTH 2 TIMES DAILY. 180 tablet 0   spironolactone (ALDACTONE) 25 MG tablet TAKE 1 TABLET (25 MG TOTAL) BY MOUTH DAILY. 90 tablet 0   torsemide (DEMADEX) 20 MG tablet TAKE 1 TABLET BY MOUTH EVERY DAY 90 tablet 0   No facility-administered medications prior to visit.    ROS Review of Systems  Constitutional:  Negative for chills, diaphoresis, fatigue and fever.  HENT:  Positive for ear pain and hearing loss. Negative for sore throat and trouble swallowing.   Eyes: Negative.   Respiratory: Negative.  Negative for cough, chest tightness, shortness of breath and wheezing.   Cardiovascular:  Negative for chest pain, palpitations and leg swelling.  Gastrointestinal:  Negative for abdominal pain, constipation, diarrhea, nausea and vomiting.  Endocrine: Negative.   Genitourinary: Negative.  Negative for difficulty urinating, dysuria and flank pain.       ++ED  Musculoskeletal: Negative.  Negative for arthralgias and myalgias.  Skin: Negative.  Negative for color change.  Neurological:  Negative for dizziness, weakness, light-headedness and headaches.  Hematological:  Negative for adenopathy. Does not bruise/bleed easily.  Psychiatric/Behavioral: Negative.     Objective:  BP 120/80 (BP Location: Right Arm, Patient Position: Sitting, Cuff Size: Large)   Pulse 76   Temp 98.5 F (  36.9 C) (Oral)   Ht 5\' 9"  (1.753 m)   Wt 215 lb (97.5 kg)   SpO2 98%   BMI 31.75 kg/m   BP Readings from Last 3 Encounters:  07/11/21 120/80  03/02/21 (!) 154/96  01/24/21 (!) 144/80    Wt Readings from Last 3 Encounters:  07/11/21 215 lb (97.5 kg)  03/02/21 220 lb (99.8 kg)  01/24/21 224 lb (101.6 kg)    Physical Exam Vitals reviewed.  Constitutional:      Appearance: Normal appearance.  HENT:     Right Ear: Tympanic membrane and external ear normal. Decreased hearing  noted. There is impacted cerumen. Tympanic membrane is not injected.     Left Ear: Tympanic membrane and external ear normal. Decreased hearing noted. There is impacted cerumen. Tympanic membrane is not injected.     Ears:     Comments: I put Colace in both EACs and then irrigated them using water and an ear pick.  The cerumen was removed.  He tolerated this well.  His hearing and discomfort has improved.    Nose: Nose normal.     Mouth/Throat:     Mouth: Mucous membranes are moist.  Eyes:     General: No scleral icterus.    Conjunctiva/sclera: Conjunctivae normal.  Cardiovascular:     Rate and Rhythm: Normal rate and regular rhythm.     Heart sounds: No murmur heard. Pulmonary:     Effort: Pulmonary effort is normal.     Breath sounds: No stridor. No wheezing, rhonchi or rales.  Abdominal:     General: Abdomen is flat.     Palpations: There is no mass.     Tenderness: There is no abdominal tenderness. There is no guarding.     Hernia: No hernia is present.  Musculoskeletal:        General: Normal range of motion.     Cervical back: Neck supple.     Right lower leg: No edema.     Left lower leg: No edema.  Lymphadenopathy:     Cervical: No cervical adenopathy.  Skin:    General: Skin is warm and dry.  Neurological:     General: No focal deficit present.     Mental Status: He is alert.  Psychiatric:        Mood and Affect: Mood normal.        Behavior: Behavior normal.    Lab Results  Component Value Date   WBC 8.3 07/11/2021   HGB 14.8 07/11/2021   HCT 44.6 07/11/2021   PLT 418.0 (H) 07/11/2021   GLUCOSE 96 07/11/2021   CHOL 142 01/18/2021   TRIG 142.0 01/18/2021   HDL 33.10 (L) 01/18/2021   LDLDIRECT 97.0 05/05/2019   LDLCALC 80 01/18/2021   ALT 22 01/18/2021   AST 15 01/18/2021   NA 131 (L) 07/11/2021   K 3.7 07/11/2021   CL 91 (L) 07/11/2021   CREATININE 1.55 (H) 07/11/2021   BUN 39 (H) 07/11/2021   CO2 29 07/11/2021   TSH 1.48 01/18/2021   PSA 2.08  03/02/2021   INR 2.8 06/21/2021   HGBA1C 5.9 07/11/2021   MICROALBUR 0.7 07/11/2021    No results found.  Assessment & Plan:   Rick Edwards was seen today for hypertension.  Diagnoses and all orders for this visit:  Need for shingles vaccine -     Zoster Vaccine Adjuvanted [facility]) injection; Inject 0.5 mLs into the muscle once for 1 dose.  Essential hypertension, malignant- His blood  pressure is overcontrolled.  I recommended that he stop taking chlorthalidone. -     Basic metabolic panel; Future -     CBC with Differential/Platelet; Future -     Urinalysis, Routine w reflex microscopic; Future -     potassium chloride (KLOR-CON) 10 MEQ tablet; Take 1 tablet (10 mEq total) by mouth 2 (two) times daily. -     spironolactone (ALDACTONE) 25 MG tablet; Take 1 tablet (25 mg total) by mouth daily. -     torsemide (DEMADEX) 20 MG tablet; Take 1 tablet (20 mg total) by mouth daily. -     amLODipine (NORVASC) 10 MG tablet; Take 1 tablet (10 mg total) by mouth at bedtime. -     Urinalysis, Routine w reflex microscopic -     CBC with Differential/Platelet -     Basic metabolic panel  Type II diabetes mellitus with manifestations ([place])- His blood sugar is very well controlled. -     Basic metabolic panel; Future -     Hemoglobin A1c; Future -     Microalbumin / creatinine urine ratio; Future -     Microalbumin / creatinine urine ratio -     Hemoglobin A1c -     Basic metabolic panel  Stage 3a chronic kidney disease ([place])- His renal function has declined.  Will discontinue chlorthalidone.  I have asked him to see nephrology again. -     CBC with Differential/Platelet; Future -     Microalbumin / creatinine urine ratio; Future -     Urinalysis, Routine w reflex microscopic; Future -     Urinalysis, Routine w reflex microscopic -     Microalbumin / creatinine urine ratio -     CBC with Differential/Platelet -     Ambulatory referral to Nephrology  Hyperlipidemia with target LDL less  than 70- LDL goal achieved. Doing well on the statin  -     atorvastatin (LIPITOR) 40 MG tablet; Take 1 tablet (40 mg total) by mouth daily.  Chronic hypokalemia -     potassium chloride (KLOR-CON) 10 MEQ tablet; Take 1 tablet (10 mEq total) by mouth 2 (two) times daily. -     spironolactone (ALDACTONE) 25 MG tablet; Take 1 tablet (25 mg total) by mouth daily.  Hearing loss due to cerumen impaction, bilateral  Drug-induced erectile dysfunction -     sildenafil (VIAGRA) 100 MG tablet; Take 1 tablet (100 mg total) by mouth as needed for erectile dysfunction.  I have discontinued [name] "Reggie"'s sildenafil and chlorthalidone. I have also changed his atorvastatin, potassium chloride, and torsemide. Additionally, I am having him start on Shingrix and sildenafil. Lastly, I am having him maintain his hyoscyamine, levocetirizine, augmented betamethasone dipropionate, carvedilol, allopurinol, enoxaparin, warfarin, Vascepa, spironolactone, and amLODipine.  Meds ordered this encounter  Medications   Zoster Vaccine Adjuvanted [facility]) injection    Sig: Inject 0.5 mLs into the muscle once for 1 dose.    Dispense:  0.5 mL    Refill:  1   atorvastatin (LIPITOR) 40 MG tablet    Sig: Take 1 tablet (40 mg total) by mouth daily.    Dispense:  90 tablet    Refill:  0   potassium chloride (KLOR-CON) 10 MEQ tablet    Sig: Take 1 tablet (10 mEq total) by mouth 2 (two) times daily.    Dispense:  180 tablet    Refill:  0   spironolactone (ALDACTONE) 25 MG tablet    Sig: Take 1  tablet (25 mg total) by mouth daily.    Dispense:  90 tablet    Refill:  0   torsemide (DEMADEX) 20 MG tablet    Sig: Take 1 tablet (20 mg total) by mouth daily.    Dispense:  90 tablet    Refill:  0   sildenafil (VIAGRA) 100 MG tablet    Sig: Take 1 tablet (100 mg total) by mouth as needed for erectile dysfunction.    Dispense:  6 tablet    Refill:  3   amLODipine (NORVASC) 10 MG tablet    Sig: Take 1 tablet  (10 mg total) by mouth at bedtime.    Dispense:  90 tablet    Refill:  1      Follow-up: Return in about 6 months (around 01/09/2022).  Scarlette Calico, MD

## 2021-07-12 ENCOUNTER — Encounter: Payer: Self-pay | Admitting: Internal Medicine

## 2021-07-12 LAB — MICROALBUMIN / CREATININE URINE RATIO
Creatinine,U: 103.2 mg/dL
Microalb Creat Ratio: 0.7 mg/g (ref 0.0–30.0)
Microalb, Ur: 0.7 mg/dL (ref 0.0–1.9)

## 2021-07-12 LAB — BASIC METABOLIC PANEL
BUN: 39 mg/dL — ABNORMAL HIGH (ref 6–23)
CO2: 29 mEq/L (ref 19–32)
Calcium: 9.9 mg/dL (ref 8.4–10.5)
Chloride: 91 mEq/L — ABNORMAL LOW (ref 96–112)
Creatinine, Ser: 1.55 mg/dL — ABNORMAL HIGH (ref 0.40–1.50)
GFR: 47.48 mL/min — ABNORMAL LOW (ref >60.00)
Glucose, Bld: 96 mg/dL (ref 70–99)
Potassium: 3.7 mEq/L (ref 3.5–5.1)
Sodium: 131 mEq/L — ABNORMAL LOW (ref 135–145)

## 2021-07-12 LAB — CBC WITH DIFFERENTIAL/PLATELET
Basophils Absolute: 0.1 10*3/uL (ref 0.0–0.1)
Basophils Relative: 1.3 % (ref 0.0–3.0)
Eosinophils Absolute: 0.3 10*3/uL (ref 0.0–0.7)
Eosinophils Relative: 3.6 % (ref 0.0–5.0)
HCT: 44.6 % (ref 39.0–52.0)
Hemoglobin: 14.8 g/dL (ref 13.0–17.0)
Lymphocytes Relative: 24.5 % (ref 12.0–46.0)
Lymphs Abs: 2 10*3/uL (ref 0.7–4.0)
MCHC: 33.2 g/dL (ref 30.0–36.0)
MCV: 91.9 fl (ref 78.0–100.0)
Monocytes Absolute: 0.8 10*3/uL (ref 0.1–1.0)
Monocytes Relative: 10.2 % (ref 3.0–12.0)
Neutro Abs: 5 10*3/uL (ref 1.4–7.7)
Neutrophils Relative %: 60.4 % (ref 43.0–77.0)
Platelets: 418 10*3/uL — ABNORMAL HIGH (ref 150.0–400.0)
RBC: 4.85 Mil/uL (ref 4.22–5.81)
RDW: 15.7 % — ABNORMAL HIGH (ref 11.5–15.5)
WBC: 8.3 10*3/uL (ref 4.0–10.5)

## 2021-07-12 LAB — URINALYSIS, ROUTINE W REFLEX MICROSCOPIC
Bilirubin Urine: NEGATIVE
Hgb urine dipstick: NEGATIVE
Ketones, ur: NEGATIVE
Leukocytes,Ua: NEGATIVE
Nitrite: NEGATIVE
RBC / HPF: NONE SEEN (ref 0–?)
Specific Gravity, Urine: 1.015 (ref 1.000–1.030)
Total Protein, Urine: NEGATIVE
Urine Glucose: NEGATIVE
Urobilinogen, UA: 0.2 (ref 0.0–1.0)
pH: 5.5 (ref 5.0–8.0)

## 2021-07-12 LAB — HEMOGLOBIN A1C: Hgb A1c MFr Bld: 5.9 % (ref 4.6–6.5)

## 2021-07-14 ENCOUNTER — Other Ambulatory Visit: Payer: Self-pay | Admitting: Internal Medicine

## 2021-07-14 DIAGNOSIS — M1A39X0 Chronic gout due to renal impairment, multiple sites, without tophus (tophi): Secondary | ICD-10-CM

## 2021-07-19 ENCOUNTER — Ambulatory Visit: Payer: Medicare Other | Admitting: *Deleted

## 2021-07-19 DIAGNOSIS — E785 Hyperlipidemia, unspecified: Secondary | ICD-10-CM

## 2021-07-19 DIAGNOSIS — I10 Essential (primary) hypertension: Secondary | ICD-10-CM

## 2021-07-19 NOTE — Chronic Care Management (AMB) (Signed)
Chronic Care Management   CCM RN Visit Note  07/19/2021 Name: Rick Edwards MRN: [id number] DOB: 1956/04/15  Subjective: Rick Edwards is a 65 y.o. year old male who is a primary care patient of Janith Lima, MD. The care management team was consulted for assistance with disease management and care coordination needs.    Engaged with patient by telephone for follow up visit in response to provider referral for case management and/or care coordination services.   Consent to Services:  The patient was given information about Chronic Care Management services, agreed to services, and gave verbal consent prior to initiation of services.  Please see initial visit note for detailed documentation.  Patient agreed to services and verbal consent obtained.   Assessment: Review of patient past medical history, allergies, medications, health status, including review of consultants reports, laboratory and other test data, was performed as part of comprehensive evaluation and provision of chronic care management services.   SDOH (Social Determinants of Health) assessments and interventions performed:  SDOH Interventions    Flowsheet Row Most Recent Value  SDOH Interventions   Food Insecurity Interventions Intervention Not Indicated  Transportation Interventions Intervention Not Indicated  [Patient continues driving self]      CCM Care Plan Allergies  Allergen Reactions   Mesalamine Rash    REACTION: Rash   Outpatient Encounter Medications as of 07/19/2021  Medication Sig Note   allopurinol (ZYLOPRIM) 300 MG tablet TAKE 1 TABLET BY MOUTH EVERY DAY    amLODipine (NORVASC) 10 MG tablet Take 1 tablet (10 mg total) by mouth at bedtime.    carvedilol (COREG) 25 MG tablet TAKE 1 TABLET BY MOUTH TWICE A DAY WITH MEALS    potassium chloride (KLOR-CON) 10 MEQ tablet Take 1 tablet (10 mEq total) by mouth 2 (two) times daily.    atorvastatin (LIPITOR) 40 MG tablet Take 1 tablet (40 mg total)  by mouth daily.    augmented betamethasone dipropionate (DIPROLENE-AF) 0.05 % cream Apply topically 2 (two) times daily.    enoxaparin (LOVENOX) 150 MG/ML injection Inject 1 mL (150 mg total) into the skin daily. Inject 74m into skin the daily as directed by anticoagulation clinic.    hyoscyamine (LEVSIN SL) 0.125 MG SL tablet Place under the tongue 2 (two) times daily as needed. 03/17/2021: 03/17/21: Reports has not needed recently   levocetirizine (XYZAL) 5 MG tablet Take 1 tablet (5 mg total) by mouth every evening.    sildenafil (VIAGRA) 100 MG tablet Take 1 tablet (100 mg total) by mouth as needed for erectile dysfunction.    spironolactone (ALDACTONE) 25 MG tablet Take 1 tablet (25 mg total) by mouth daily.    torsemide (DEMADEX) 20 MG tablet Take 1 tablet (20 mg total) by mouth daily.    VASCEPA 1 g capsule TAKE 2 CAPSULES (2 G TOTAL) BY MOUTH 2 (TWO) TIMES DAILY.    warfarin (COUMADIN) 5 MG tablet TAKE 1 1/2 TABLETS BY MOUTH DAILY EXCEPT TAKE 1 TABLET ON MONDAYS AND THURSDAYS OR AS DIRECTED BY ANTICOAGULATION CLINIC    [DISCONTINUED] fenofibrate (TRICOR) 145 MG tablet Take 1 tablet by mouth once daily    [DISCONTINUED] indapamide (LOZOL) 1.25 MG tablet Take 1 tablet (1.25 mg total) by mouth daily. 07/07/2019: palpitations   No facility-administered encounter medications on file as of 07/19/2021.   Patient Active Problem List   Diagnosis Date Noted   Benign prostatic hyperplasia without lower urinary tract symptoms 03/02/2021   Intrinsic eczema 079/39/0300  LSC (lichen  simplex chronicus) 01/18/2021   Tinea corporis 01/18/2021   Hearing loss due to cerumen impaction, bilateral 06/01/2020   Chronic gout due to renal impairment of multiple sites without tophus 06/01/2020   Hypertensive urgency, malignant 08/12/2019   Type II diabetes mellitus with manifestations ([place]) 08/17/2018   Atherosclerosis of aorta ([place]) 09/26/2017   Drug-induced erectile dysfunction 07/19/2016   Pure  hypertriglyceridemia 04/05/2016   Hyperlipidemia with target LDL less than 70 01/18/2016   Proteinuria 03/13/2014   OA (osteoarthritis) of knee 11/25/2013   PVD (peripheral vascular disease) ([place]) 09/24/2013   Encounter for therapeutic drug monitoring 09/16/2013   Kidney disease, chronic, stage III (GFR 30-59 ml/min) (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 ([place]) 04/10/2012   Splenic infarction 03/20/2012   [facility] [place] 03/19/2009   Essential hypertension, malignant 03/02/2009   Conditions to be addressed/monitored:  HTN and HLD  Care Plan : RN Care Manager Plan of Care  Updates made by Knox Royalty, RN since 07/19/2021 12:00 AM     Problem: Chronic Disease Management Needs   Priority: Medium     Long-Range Goal: Development of plan of care for long term chronic disease management   Start Date: 03/17/2021  Expected End Date: 03/17/2022  Recent Progress: On track  Priority: Medium  Note:   Current Barriers:  Chronic Disease Management support and education needs related to HTN and HLD Limited health literacy-- requires ongoing reinforcement/ extra time in conversation Hard of Hearing  RNCM Clinical Goal(s):  Patient will demonstrate ongoing health management independence for HTN/ HLD  through collaboration with RN Care manager, provider, and care team.   Interventions: 1:1 collaboration with primary care provider regarding development and update of comprehensive plan of care as evidenced by provider attestation and co-signature Inter-disciplinary care team collaboration (see longitudinal plan of care) Evaluation of current treatment plan related to  self management and patient's adherence to plan as established by provider  Hyperlipidemia:  (Status: Goal on Track (progressing): YES.) Lab Results  Component Value Date    CHOL 142 01/18/2021   HDL 33.10 (L) 01/18/2021   [facility] 80 01/18/2021   LDLDIRECT 97.0 05/05/2019   TRIG 142.0 01/18/2021   CHOLHDL 4 01/18/2021  Reviewed importance of limiting foods high in cholesterol; Reviewed exercise goals and target of 150 minutes per week; Confirmed patient continues taking medications for HLD as prescribed  Hypertension: (Status: Goal on Track (progressing): YES.) Last practice recorded BP readings:  BP Readings from Last 3 Encounters:  07/11/21 120/80  03/02/21 (!) 154/96  01/24/21 (!) 144/80  Most recent eGFR/CrCl: No results found for: EGFR  No components found for: CRCL  Evaluation of current treatment plan related to hypertension self management and patient's adherence to plan as established by provider;   Reviewed prescribed diet heart healthy, low sodium, low cholesterol, coumadin- restricted Reviewed medications with patient and discussed importance of compliance;  Counseled on the importance of exercise goals with target of 150 minutes per week Discussed plans with patient for ongoing care management follow up and provided patient with direct contact information for care management team; Discussed complications of poorly controlled blood pressure such as heart disease, stroke, circulatory complications, vision complications, kidney impairment, sexual dysfunction;  Assessed social determinant of health barriers;  Reviewed recent PCP office visit 07/11/21: patient is very confused about changes made during office visit: he does not have his  post- visit AVS, states he lost it Reviewed medications with patient and provided post-PCP office visit (07/11/21) instructions to stop taking chlorthalidone: he had not stopped taking this medication after the visit, and I encouraged him to stop now; he verbalizes understanding and states he will stop taking; I will mail patient a copy of his medication list-- I encouraged him to go through this list carefully and to  adhere to instructions; confirmed patient has not had recent episodes of dizziness/ other signs/ symptoms low blood pressures Reviewed recent blood pressures at home: he reports general ranges between 113-120/80's Confirmed patient has continued trying to stay active and occasionally walking for exercise- positive reinforcement provided Reviewed with patient upcoming provider appointment: Coumadin clinic 08/09/20 Encouraged ongoing physical activity/ exercise; discussed benefit of activity in setting of HTN/ HLD  Patient Goals/Self-Care Activities: As evidenced by review of EHR, collaboration with care team, and patient reporting during CCM RN CM outreach, Patient Rick Edwards will:  Take medications as prescribed-- please review the list of medications you are on (listed on these papers) to make sure you are taking the medications the way your are supposed to Keep an updated list of your medications every time you have a doctor's appointment to help you stay on track with your medicine Attend all scheduled provider appointments Call pharmacy for medication refills Attend church or other social activities Provider office for new concerns or questions Continue checking and writing down blood pressures at home each week;  Will continue following a heart-healthy, low salt, low cholesterol, and Coumadin-restricted diet Will continue to make efforts to walk for exercise on a weekly basis    Plan: Telephone follow up appointment with care management team member scheduled for:  Tuesday, October 18, 2021 at 2:15 pm The patient has been provided with contact information for the care management team and has been advised to call with any health related questions or concerns  Oneta Rack, RN, BSN, [place] [phone]: direct office

## 2021-07-19 NOTE — Patient Instructions (Addendum)
Visit Information  Rick Edwards, thank you for taking time to talk with me today. Please don't hesitate to contact me if I can be of assistance to you before our next scheduled telephone appointment.  Below are the goals we discussed today:  Patient Self-Care Activities: Patient Rick Edwards will:  Take medications as prescribed-- please review the list of medications you are on (listed on these papers) to make sure you are taking the medications the way your are supposed to Keep an updated list of your medications every time you have a doctor's appointment to help you stay on track with your medicine Attend all scheduled provider appointments Call pharmacy for medication refills Attend church or other social activities Provider office for new concerns or questions Continue checking and writing down blood pressures at home each week;  Will continue following a heart-healthy, low salt, low cholesterol, and Coumadin-restricted diet Will continue to make efforts to walk for exercise on a weekly basis  Our next scheduled telephone follow up visit/ appointment with care management team member is scheduled on:  Tuesday, October 18, 2021 at 2:15 pm  If you need to cancel or re-schedule our visit, please call [phone] and our care guide team will be happy to assist you.   I look forward to hearing about your progress.   Oneta Rack, RN, BSN, [place] [phone]: direct office  If you are experiencing a Mental Health or [place] or need someone to talk to, please  call the Suicide and Crisis Lifeline: 988 call the [place] National Suicide Prevention Lifeline: 330-671-1731 or TTY: (774)800-3902 TTY (248)502-2167) to talk to a trained counselor call 1-800-273-TALK (toll free, 24 hour hotline) go to [facility] [address] [place] [phone]) call 911   The patient verbalized  understanding of instructions, educational materials, and care plan provided today and agreed to receive a mailed copy of patient instructions, educational materials, and care plan   Basics of Medicine Management Taking your medicines correctly is an important part of managing or preventing medical problems. Make sure you know what disease or condition your medicine is treating, and how and when to take it. If you do not take your medicine correctly, it may not work well and may cause unpleasant side effects, including serious health problems. What should I do when I am taking medicines?  Read all the labels and inserts that come with your medicines. Review the information often and with each refill. Talk with your pharmacist if you get a refill and notice a change in the size, color, or shape of your medicines. Know the potential side effects for each medicine that you take. Try to get all your medicines from the same pharmacy. The pharmacist will have all your information and will understand how your medicines will affect each other (interact). Always carry an updated list of your medicines with you. If there is an emergency, a first responder can quickly see what medicines you are taking. Tell your health care provider about all your medicines, including over-the-counter medicines, vitamins, and herbal or dietary supplements. Your health care provider will make sure that nothing will interact with any of your prescribed medicines. How can I take my medicines safely? Take medicines only as told by your health care provider. Do not take more of your medicine than instructed. Do not take anyone else's medicines. Do not share your medicines with others. Do not stop taking your  medicines unless your health care provider tells you to do so. You may need to avoid alcohol or certain foods or liquids when taking certain medicines. Follow your health care provider's instructions. Do not split, cut,  crush, or chew your medicines unless your health care provider tells you to do so. Tell your health care provider if you have trouble swallowing your medicines. For liquid medicine, use the dosing container that was provided. Household spoons are not accurate. How should I organize my medicines? Know your medicines Know what each of your medicines looks like. This includes size, color, and shape. Tell your health care provider if you are having trouble recognizing all the medicines that you are taking. If you cannot tell your medicines apart because they look similar, keep them in the original bottles. If you cannot read the labels on the bottles, tell your pharmacist to put your medicines in containers with large print. Review your medicines and your schedule with family members, a friend, or a caregiver. Use a pill organizer Use a tool to organize your medicine schedule. Tools include a weekly pillbox, a written chart, a notebook, or a calendar. Your tool should help you remember the following things about each medicine: The name of the medicine. The amount (dose) to take. The schedule. This is the day and time the medicine should be taken. The appearance. This includes color, shape, size, and stamp. How to take your medicines. This includes instructions to take them with food, without food, with fluids, or with other medicines. Create reminders for taking your medicines. Use sticky notes, or use alarms on your watch, mobile device, or phone calendar. You may choose to use a more advanced management system. These systems have storage, alarms, and visual and audio prompts. Some medicines can be taken on an "as-needed" basis. These may include medicines for nausea, constipation, pain, cough and cold, allergies, and anxiety. If you take an as-needed medicine, write down the name and dose, as well as the date and time that you took it. How should I plan for travel? Take your pillbox, medicines, and  organization system with you when traveling. Have your medicines refilled before you travel. This will ensure that you do not run out of your medicines while you are away from home. Always carry an updated list of your medicines with you. If there is an emergency, a first responder can quickly see what medicines you are taking. Do not pack your medicines in checked luggage in case your luggage is lost or delayed. Keep your medicines in your carry-on bag. If any of your medicines is considered a controlled substance, make sure you bring a letter from your health care provider with you. How should I store and discard my medicines? For safe storage: Store medicines in a cool, dry area away from light, or as directed by your health care provider. Do not store medicines in the bathroom. Heat and humidity will affect them. Do not store your medicines with other chemicals or with medicines for pets or other household members. Keep medicines away from children and pets. Do not leave them on counters or bedside tables. Store them in high cabinets or on high shelves. For safe disposal: Check expiration dates regularly. Do not take expired medicines. Discard medicines that are older than the expiration date. Learn a safe way to dispose of your medicines. You may: Use a local government, hospital, or pharmacy medicine-take-back program. If you cannot return the medicine, check the label or package insert  to see if the medicine should be thrown out in the garbage or flushed down the toilet. If you are not sure, ask your health care team. If it is safe to put the medicine in the trash, empty the medicine out of the container. Mix the medicine with cat litter, dirt, coffee grounds, or another unwanted substance. Seal the mixture in a bag or container. Put it in the trash. What should I remember? Tell your health care provider if you: Experience side effects. Have new symptoms. Feel that your medicine is no  longer working. Have other concerns about taking your medicines. Review your medicines regularly with your health care provider. Other medicines, diet, medical conditions, weight changes, and daily habits can all affect how medicines work. Ask if you need to continue taking each medicine, and discuss how well each one is working. Refill your medicines early to avoid running out of them. In case of an accidental overdose, call your local poison control center at 1-205-228-5283 or go to your local emergency department right away. Summary Taking your medicines correctly is an important part of managing or preventing medical problems. You need to make sure that you understand what you are taking a medicine for, as well as how and when you need to take it. Use a tool to organize your medicine schedule. Tools include a weekly pillbox, a written chart, a notebook, or a calendar. In case of an accidental overdose, call your local [place] at [phone] or go to your local emergency department right away. This information is not intended to replace advice given to you by your health care provider. Make sure you discuss any questions you have with your health care provider. Document Revised: 03/01/2021 Document Reviewed: 03/01/2021 Elsevier Patient Education  [place].

## 2021-07-27 ENCOUNTER — Ambulatory Visit (INDEPENDENT_AMBULATORY_CARE_PROVIDER_SITE_OTHER): Payer: Medicare Other | Admitting: Family Medicine

## 2021-07-27 VITALS — BP 134/77 | Ht 69.0 in | Wt 210.0 lb

## 2021-07-27 DIAGNOSIS — M1711 Unilateral primary osteoarthritis, right knee: Secondary | ICD-10-CM

## 2021-07-27 DIAGNOSIS — I743 Embolism and thrombosis of arteries of the lower extremities: Secondary | ICD-10-CM

## 2021-07-27 MED ORDER — METHYLPREDNISOLONE ACETATE 40 MG/ML IJ SUSP
40.0000 mg | Freq: Once | INTRAMUSCULAR | Status: AC
  Administered 2021-07-27: 11:00:00 40 mg via INTRA_ARTICULAR

## 2021-07-27 NOTE — Progress Notes (Addendum)
PCP: Janith Lima, MD  Subjective:   HPI: Patient is a 65 y.o. male here for right knee pain.  Patient reports right knee pain located along the anterior joint line, medial worse than lateral. He's had right knee problems intermittently for many years with imaging showing advanced DJD in the medial compartment. Has had steroid injections in the past which he finds very helpful. He thinks his last injection was ~2 years ago.  This particular flare has been present x1.5 weeks. Pain is worse with walking or activity. Minimal pain at rest, but sharp pain when walking and an associated popping or rubbing type sensation. He has not tried any medications, topical therapies, ice/heat, bracing, or other things for relief recently. No recent trauma or injury that he's aware of.  Patient does wonder if it's related to his shoe insert in some way- has a hx of transmetatarsal amputation and notes when he takes his shoe off his knee seems to hurt less.  Past Medical History:  Diagnosis Date   Anemia    anemia thrombocytopenia, saw Hematology 2011, can not r/o myeloproliferative d/c   Antithrombin III deficiency ([place]) 05/16/2013   On coumadin for this   Blood clot in vein    in right leg   Chronic kidney disease 05/27/2013   ACUTE RENAL FAILURE    Crohn's disease ([place])    tx. Imuran   GI bleed 6/11   w/ normal EGD 6/11, 3 PRBCs    Hearing loss    wears hearing aid left ear; birthed with hearing loss   HOH (hard of hearing) 05/16/2013   Hypercalcemia    Hyperkalemia 05/27/2013   Hypertension    Ileostomy in place [facility]) 04-11-13   04-18-13 ileostomy to be taken down.   Perianal abscess 2001   s/p right hemicolectomy, and drainage of retroperitoneal abcess 2003   Peripheral vascular disease ([place])    Transfusion history    last 8'13    Current Outpatient Medications on File Prior to Visit  Medication Sig Dispense Refill   allopurinol (ZYLOPRIM) 300 MG tablet TAKE 1 TABLET BY MOUTH EVERY DAY  90 tablet 0   amLODipine (NORVASC) 10 MG tablet Take 1 tablet (10 mg total) by mouth at bedtime. 90 tablet 1   atorvastatin (LIPITOR) 40 MG tablet Take 1 tablet (40 mg total) by mouth daily. 90 tablet 0   augmented betamethasone dipropionate (DIPROLENE-AF) 0.05 % cream Apply topically 2 (two) times daily. 50 g 1   carvedilol (COREG) 25 MG tablet TAKE 1 TABLET BY MOUTH TWICE A DAY WITH MEALS 180 tablet 1   enoxaparin (LOVENOX) 150 MG/ML injection Inject 1 mL (150 mg total) into the skin daily. Inject 21mL into skin the daily as directed by anticoagulation clinic. 8 mL 0   hyoscyamine (LEVSIN SL) 0.125 MG SL tablet Place under the tongue 2 (two) times daily as needed.     levocetirizine (XYZAL) 5 MG tablet Take 1 tablet (5 mg total) by mouth every evening. 90 tablet 1   potassium chloride (KLOR-CON) 10 MEQ tablet Take 1 tablet (10 mEq total) by mouth 2 (two) times daily. 180 tablet 0   sildenafil (VIAGRA) 100 MG tablet Take 1 tablet (100 mg total) by mouth as needed for erectile dysfunction. 6 tablet 3   spironolactone (ALDACTONE) 25 MG tablet Take 1 tablet (25 mg total) by mouth daily. 90 tablet 0   torsemide (DEMADEX) 20 MG tablet Take 1 tablet (20 mg total) by mouth daily. 90 tablet  0   VASCEPA 1 g capsule TAKE 2 CAPSULES (2 G TOTAL) BY MOUTH 2 (TWO) TIMES DAILY. 360 capsule 1   warfarin (COUMADIN) 5 MG tablet TAKE 1 1/2 TABLETS BY MOUTH DAILY EXCEPT TAKE 1 TABLET ON MONDAYS AND THURSDAYS OR AS DIRECTED BY ANTICOAGULATION CLINIC 120 tablet 1   [DISCONTINUED] fenofibrate (TRICOR) 145 MG tablet Take 1 tablet by mouth once daily 90 tablet 0   [DISCONTINUED] indapamide (LOZOL) 1.25 MG tablet Take 1 tablet (1.25 mg total) by mouth daily. 90 tablet 0   No current facility-administered medications on file prior to visit.    Past Surgical History:  Procedure Laterality Date   Anal fistulotomy  2002   EMBOLECTOMY  03/12/2012   Procedure: EMBOLECTOMY;  Surgeon: Angelia Mould, MD;  Location: [facility];  Service: Vascular;  Laterality: Right;  Right popliteal embolectomy with vein patch angioplasty, right posterior tibial embolectomy with vein patch angioplasty    HEMICOLECTOMY  08/29/2001   perforated abscess   ILEOSTOMY CLOSURE N/A 04/18/2013   Procedure: ILEOSTOMY TAKEDOWN;  Surgeon: Edward Jolly, MD;  Location: [facility];  Service: General;  Laterality: N/A;   INTRAOPERATIVE ARTERIOGRAM  03/12/2012   Procedure: INTRA OPERATIVE ARTERIOGRAM;  Surgeon: Angelia Mould, MD;  Location: [facility];  Service: Vascular;  Laterality: Right;   PARTIAL COLECTOMY  03/11/2012   Procedure: PARTIAL COLECTOMY;  Surgeon: Edward Jolly, MD;  Location: [facility];  Service: General;  Laterality: N/A;  subtotal colectomy transverse and left colon    TRANSMETATARSAL AMPUTATION  05/10/2012   right    Allergies  Allergen Reactions   Mesalamine Rash    REACTION: Rash    BP 134/77    Ht 5\' 9"  (1.753 m)    Wt 210 lb (95.3 kg)    BMI 31.01 kg/m   No flowsheet data found.  No flowsheet data found.      Objective:  Physical Exam:  Gen: NAD, comfortable in exam room Right Knee: Inspection was negative for erythema, ecchymosis, and effusion. No obvious bony abnormalities. Moderate joint line tenderness at medial aspect of knee; Patellar and quadriceps tendons unremarkable, no tenderness of the pes anserine bursa. No obvious Baker's cyst development. ROM normal in extension (0 degrees). Slightly decreased ROM in flexion secondary to pain. Strength 5/5 with knee flexion and extension. Antalgic gait noted secondary to pain.   Provocative Testing: - Cruciate Ligaments:   - Anterior Drawer/Lachman test: NEG - Posterior Drawer: NEG  - Collateral Ligaments:   - Varus/Valgus (MCL/LCL) Stress test at 0, 15d: NEG  - Meniscus:   - McMurray's: NEG   Assessment & Plan:  1. Right Knee Pain, Severe DJD Patient's right knee pain consistent with acute flare of his known DJD. Injection performed today as  below. Discussed treatment with Tylenol, voltaren gel, strengthening exercises. He will consider PT, viscosupplementation, or knee replacement as needed in the future.  PROCEDURE: INJECTION: RIGHT KNEE After informed written consent timeout was performed, patient was seated on exam table. The patient's right knee was prepped with alcohol swab and utilizing a medial anterior approach, the patient's knee was injected intraarticularly with 3:1 lidocaine: depomedrol. Patient tolerated the procedure well without immediate complications.   Alcus Dad, MD PGY-2 [place]

## 2021-07-27 NOTE — Patient Instructions (Signed)
Your pain is due to arthritis. These are the different medications you can take for this: Tylenol 500mg  1-2 tabs three times a day for pain. Voltaren gel, Capsaicin, aspercreme, or biofreeze topically up to four times a day may also help with pain. Some supplements that may help for arthritis: Boswellia extract, curcumin, pycnogenol Cortisone injections are an option - you were given this today. If cortisone injections do not help, there are different types of shots that may help but they take longer to take effect (gel shots). It's important that you continue to stay active. Straight leg raises, knee extensions 3 sets of 10 once a day (add ankle weight if these become too easy). Consider physical therapy to strengthen muscles around the joint that hurts to take pressure off of the joint itself. Shoe inserts with good arch support may be helpful. Heat or ice 15 minutes at a time 3-4 times a day as needed to help with pain. Water aerobics and cycling with low resistance are the best two types of exercise for arthritis though any exercise is ok as long as it doesn't worsen the pain. Follow up with me as needed.

## 2021-07-28 ENCOUNTER — Other Ambulatory Visit: Payer: Self-pay | Admitting: Internal Medicine

## 2021-07-28 DIAGNOSIS — I10 Essential (primary) hypertension: Secondary | ICD-10-CM

## 2021-08-02 ENCOUNTER — Ambulatory Visit: Payer: Medicare Other

## 2021-08-09 ENCOUNTER — Ambulatory Visit (INDEPENDENT_AMBULATORY_CARE_PROVIDER_SITE_OTHER): Payer: Medicare Other

## 2021-08-09 ENCOUNTER — Other Ambulatory Visit: Payer: Self-pay

## 2021-08-09 DIAGNOSIS — Z7901 Long term (current) use of anticoagulants: Secondary | ICD-10-CM

## 2021-08-09 LAB — POCT INR: INR: 3.4 — AB (ref 2.0–3.0)

## 2021-08-09 NOTE — Progress Notes (Signed)
Only take 1/2 tablet today and then change weekly dose to take 1 tablet daily except take 1 1/2 tablets on Mondays, Wednesdays, and Fridays. Recheck in 3 wks.

## 2021-08-09 NOTE — Patient Instructions (Addendum)
Pre visit review using our clinic review tool, if applicable. No additional management support is needed unless otherwise documented below in the visit note.  Only take 1/2 tablet today and then change weekly dose to take 1 tablet daily except take 1 1/2 tablets on Mondays, Wednesdays, and Fridays. Recheck in 3 wks.

## 2021-08-10 DIAGNOSIS — H2513 Age-related nuclear cataract, bilateral: Secondary | ICD-10-CM | POA: Diagnosis not present

## 2021-08-10 LAB — HM DIABETES EYE EXAM

## 2021-08-16 DIAGNOSIS — I10 Essential (primary) hypertension: Secondary | ICD-10-CM | POA: Diagnosis not present

## 2021-08-30 ENCOUNTER — Other Ambulatory Visit: Payer: Self-pay

## 2021-08-30 ENCOUNTER — Ambulatory Visit (INDEPENDENT_AMBULATORY_CARE_PROVIDER_SITE_OTHER): Payer: Medicare Other

## 2021-08-30 DIAGNOSIS — Z7901 Long term (current) use of anticoagulants: Secondary | ICD-10-CM | POA: Diagnosis not present

## 2021-08-30 LAB — POCT INR: INR: 3.9 — AB (ref 2.0–3.0)

## 2021-08-30 NOTE — Patient Instructions (Addendum)
Pre visit review using our clinic review tool, if applicable. No additional management support is needed unless otherwise documented below in the visit note.  Hold dose today and then change weekly dose to take 1 tablet daily except take 1 1/2 tablets on Mondays, Wednesdays, and Fridays. Recheck in 3 wks.

## 2021-08-30 NOTE — Progress Notes (Signed)
Hold dose today and then change weekly dose to take 1 tablet daily except take 1 1/2 tablets on Mondays, Wednesdays, and Fridays. Recheck in 3 wks.

## 2021-09-20 ENCOUNTER — Ambulatory Visit (INDEPENDENT_AMBULATORY_CARE_PROVIDER_SITE_OTHER): Payer: Medicare Other

## 2021-09-20 ENCOUNTER — Other Ambulatory Visit: Payer: Self-pay

## 2021-09-20 DIAGNOSIS — Z7901 Long term (current) use of anticoagulants: Secondary | ICD-10-CM

## 2021-09-20 LAB — POCT INR: INR: 1.9 — AB (ref 2.0–3.0)

## 2021-09-20 NOTE — Progress Notes (Signed)
Increase dose today to take 1 1/2 tablets and then continue 1 tablet daily except take 1 1/2 tablets on Wednesdays. Recheck in 3 wks.

## 2021-09-20 NOTE — Patient Instructions (Addendum)
Pre visit review using our clinic review tool, if applicable. No additional management support is needed unless otherwise documented below in the visit note.  Increase dose today to take 1 1/2 tablets and then continue 1 tablet daily except take 1 1/2 tablets on Wednesdays. Recheck in 3 wks.

## 2021-09-26 ENCOUNTER — Telehealth: Payer: Self-pay | Admitting: Internal Medicine

## 2021-09-26 IMAGING — DX DG HAND COMPLETE 3+V*L*
3 series · 3 of 3 positions shown · non-contrast
Comparison: Left hand radiograph dated 09/17/2018.

CLINICAL DATA: 63-year-old male with chronic left hand pain.

EXAM:
LEFT HAND - COMPLETE 3+ VIEW

[hand pa]
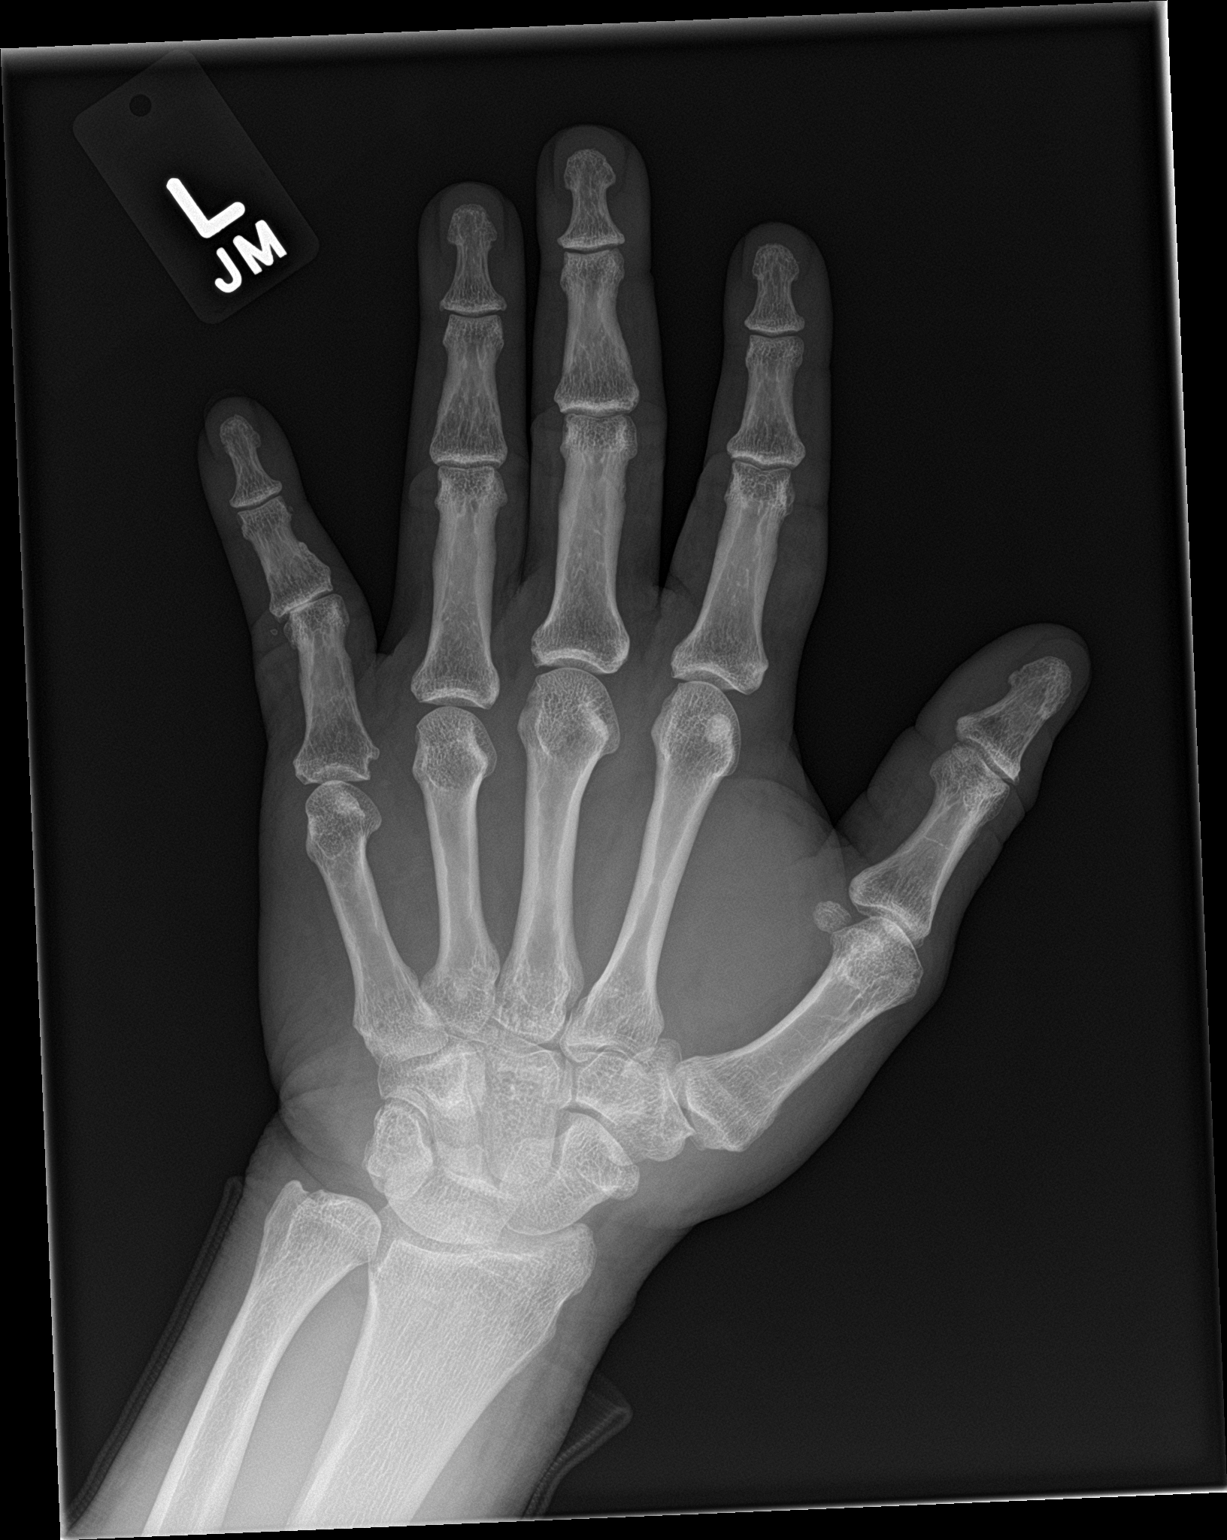

[hand obl]
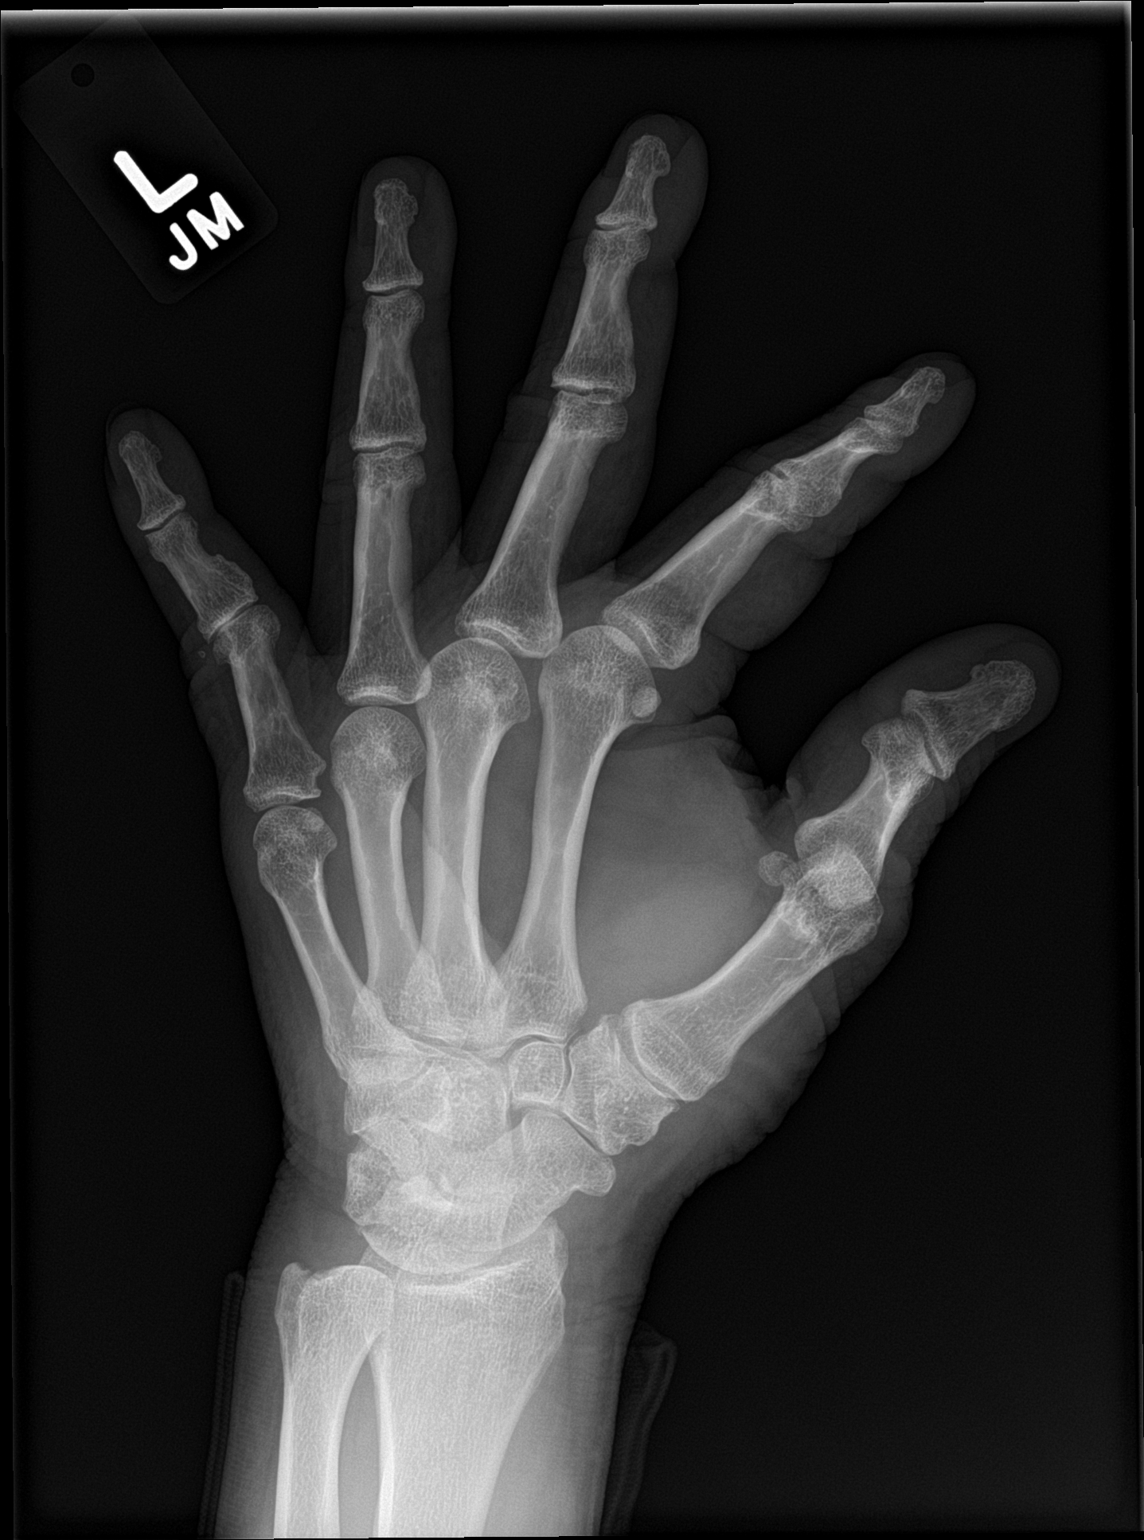

[hand lat]
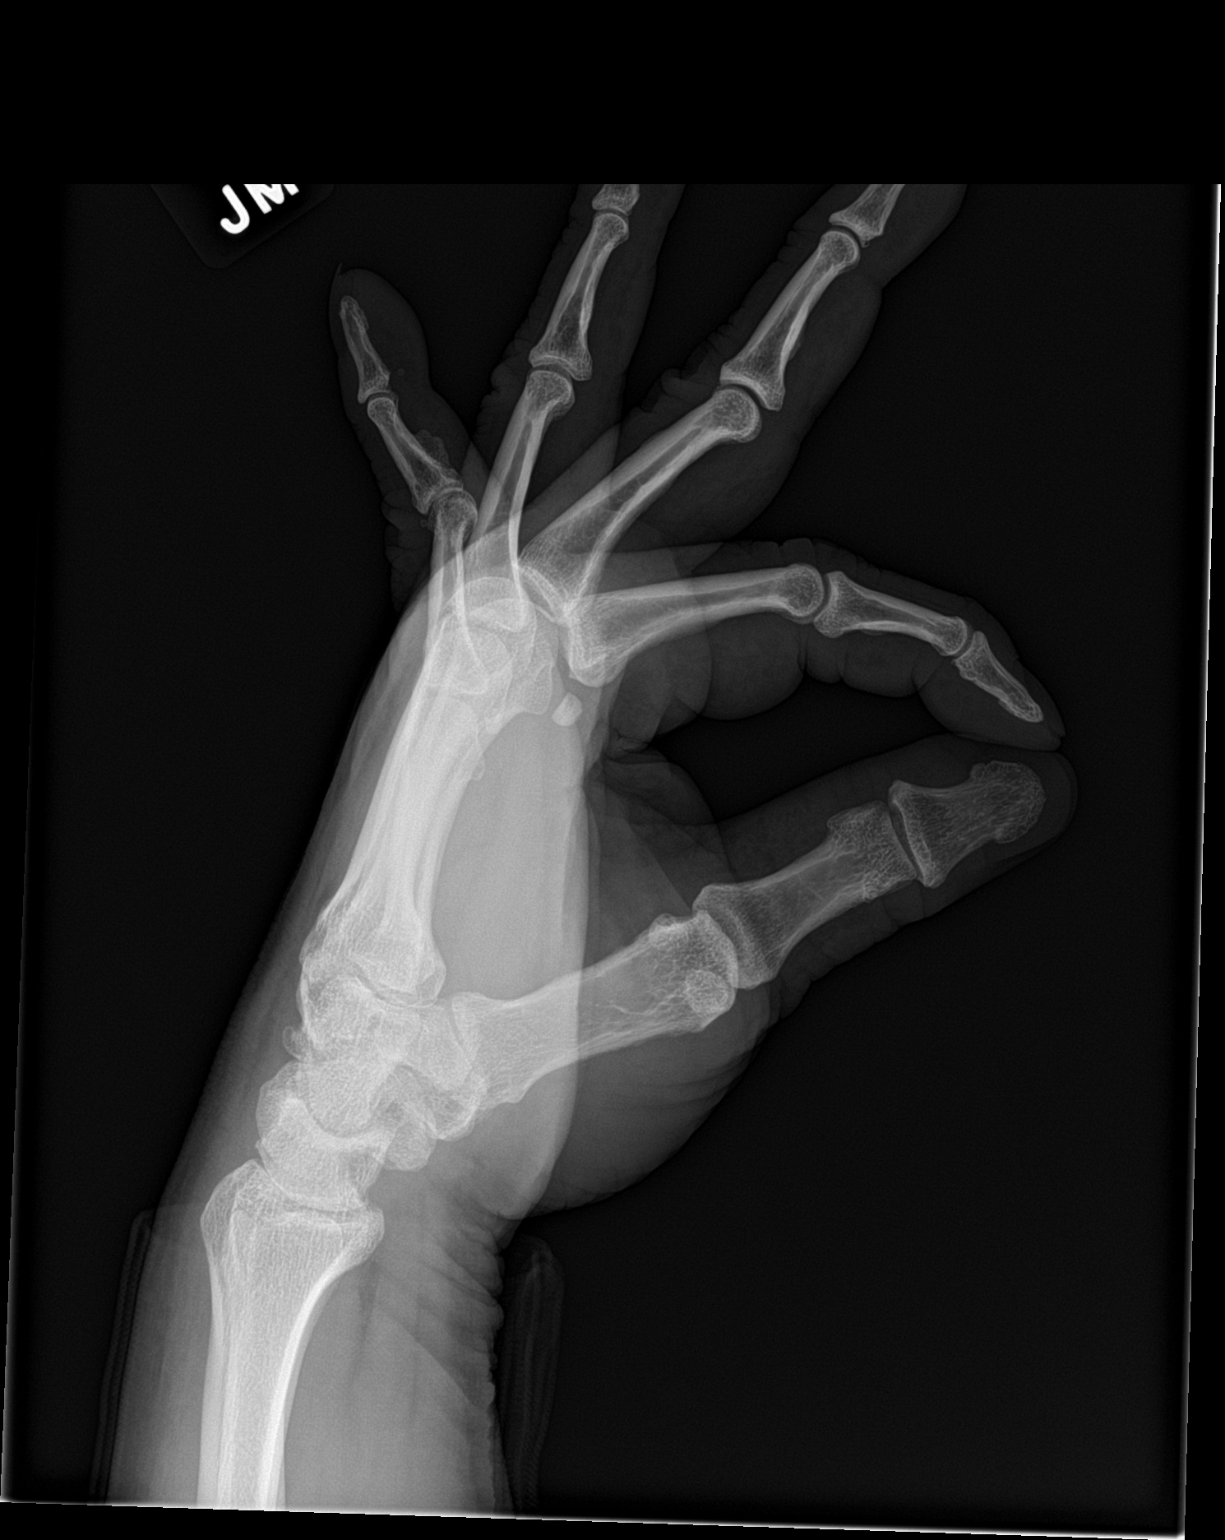

[3 of 3 positions shown; findings below may reference images not displayed]

FINDINGS: There is no acute fracture or dislocation. There is narrowing of the
PIP joint of the fifth digit with mild hyperextension similar to
prior radiograph. There is mild juxta-articular osteopenia. The soft
tissues are unremarkable.
IMPRESSION: 1. No acute fracture or dislocation.
2. Arthritic changes of the PIP joint of the fifth digit with mild
hyperextension similar to prior radiograph.

## 2021-09-26 IMAGING — DX DG KNEE COMPLETE 4+V*R*
5 series · 5 of 5 positions shown · non-contrast
Comparison: Right knee radiograph dated 07/02/2019.

CLINICAL DATA: 63-year-old male with bilateral knee pain.
Osteoarthritis.

EXAM:
LEFT KNEE - COMPLETE 4+ VIEW; RIGHT KNEE - COMPLETE 4+ VIEW

[knee lat (1 of 2)]
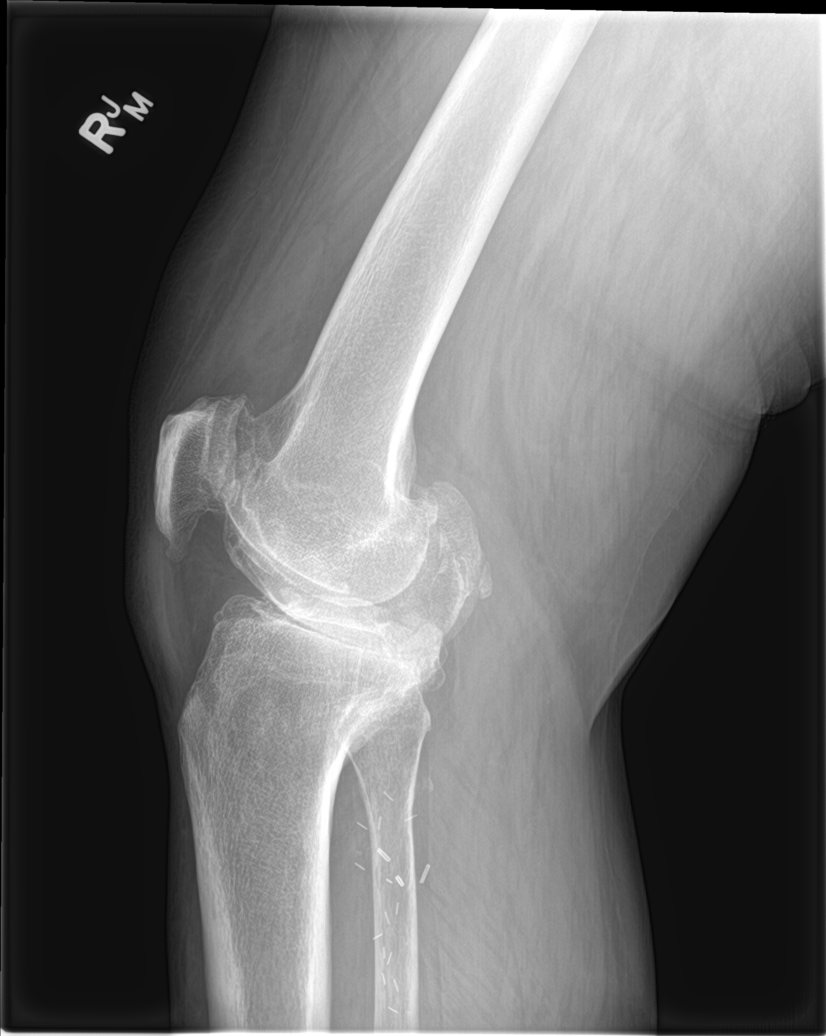

[knee sunrise]
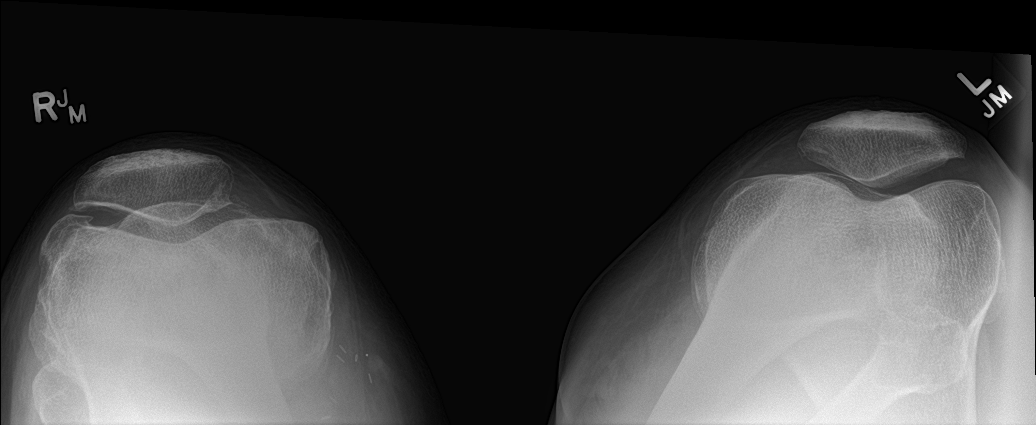

[knee ap bilat standing (1 of 2)]
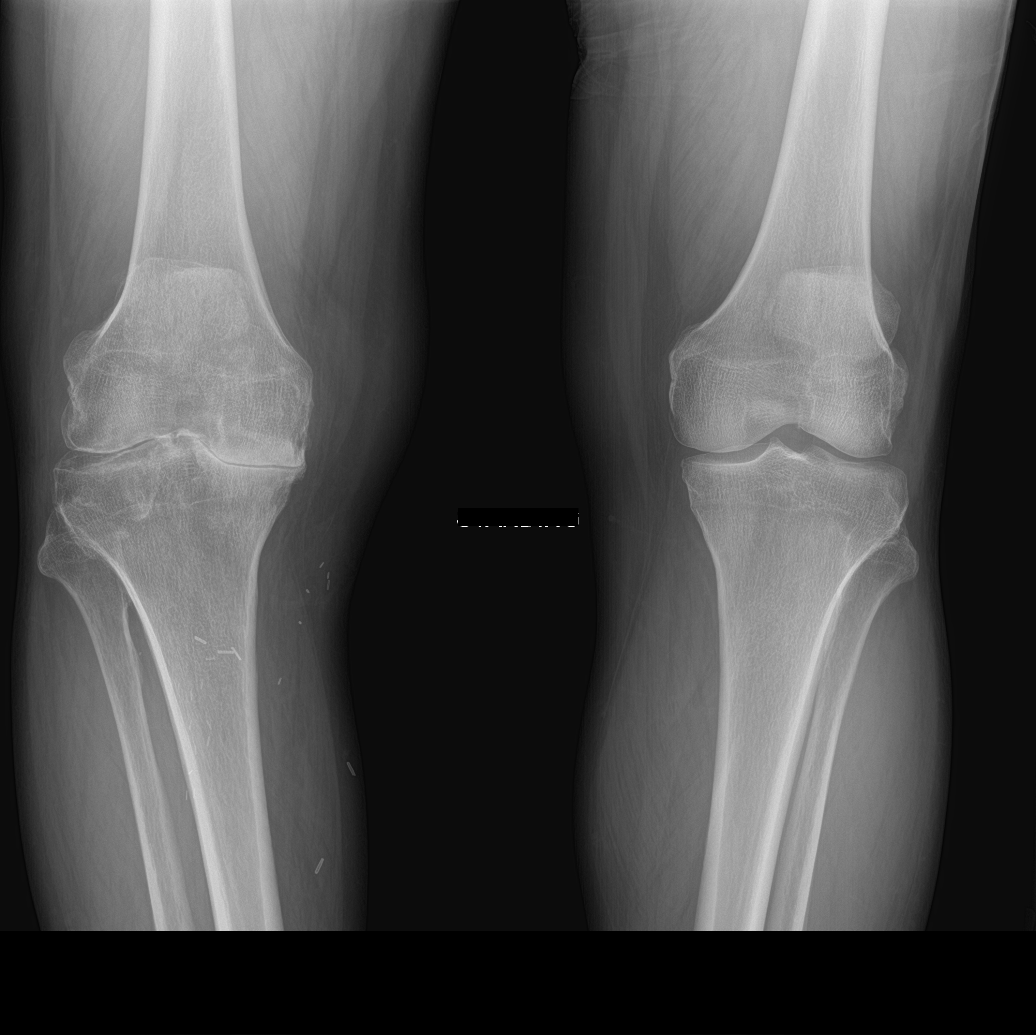

[knee ap bilat standing (2 of 2)]
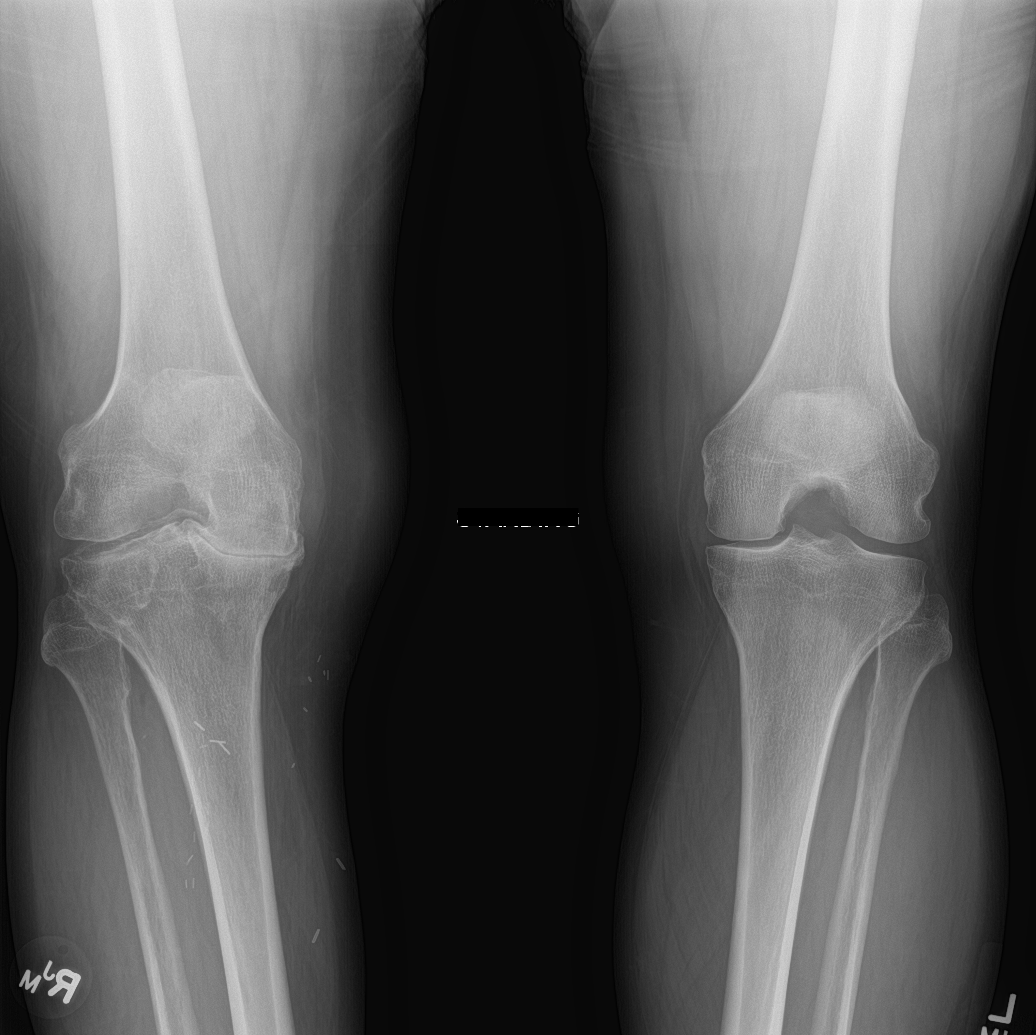

[knee lat (2 of 2)]
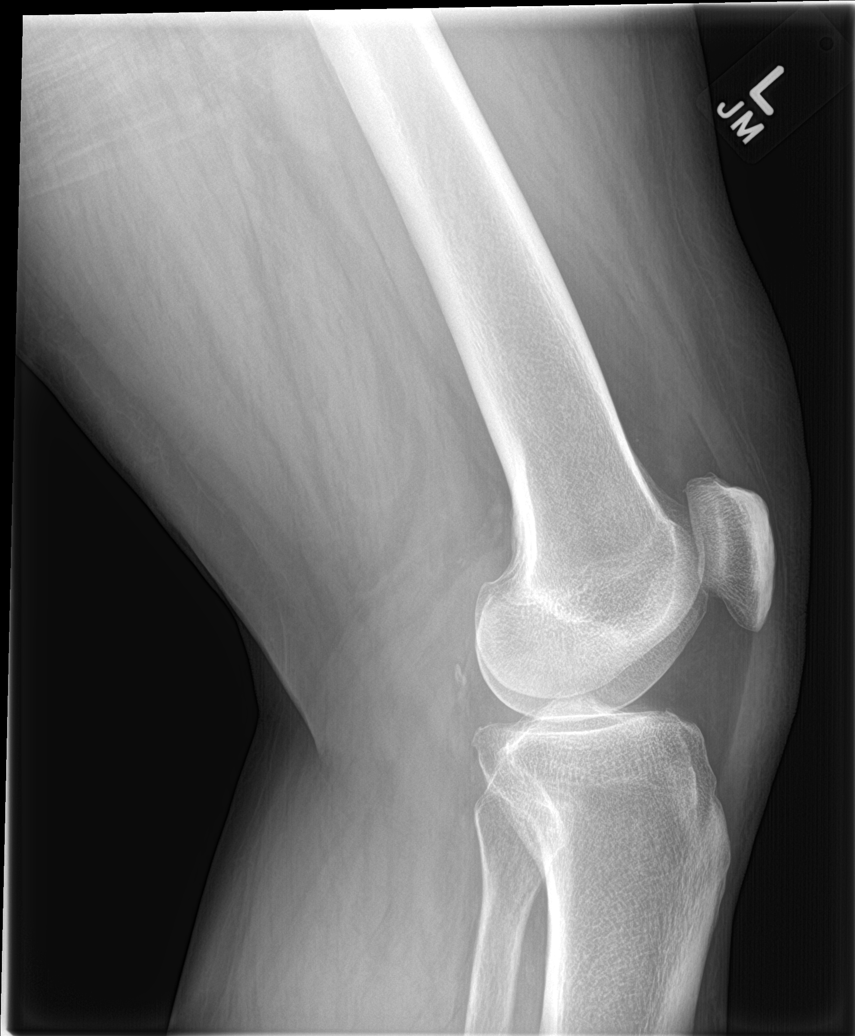

[5 of 5 positions shown; findings below may reference images not displayed]

FINDINGS: There is no acute fracture or dislocation.

Right knee: There is severe osteoarthritic changes of the right knee
with severe tricompartmental narrowing and bone spurring. There is
near bone-on-bone contact of the medial compartment. The bones are
mildly osteopenic. There is a small to moderate suprapatellar
effusion. Multiple surgical clips noted over the medial calf.

Left knee: Mild arthritic changes of the left knee with mild
narrowing of the medial compartment. No joint effusion. The soft
tissues are unremarkable.
IMPRESSION: 1. No acute fracture or dislocation.
2. Severe osteoarthritic changes of the right knee with near
bone-on-bone contact of the medial compartment.
3. Small to moderate right suprapatellar effusion.
4. Minimal arthritic changes of the left knee.

## 2021-09-26 NOTE — Telephone Encounter (Signed)
N/A unable to leave a message for patient to call back to schedule Medicare Annual Wellness Visit   Last AWV  09/24/20  Please schedule at anytime with [facility] if patient calls the office back.    40 Minutes appointment   Any questions, please call me at [phone]

## 2021-10-07 ENCOUNTER — Other Ambulatory Visit: Payer: Self-pay | Admitting: Internal Medicine

## 2021-10-07 DIAGNOSIS — I10 Essential (primary) hypertension: Secondary | ICD-10-CM

## 2021-10-07 DIAGNOSIS — E876 Hypokalemia: Secondary | ICD-10-CM

## 2021-10-11 ENCOUNTER — Other Ambulatory Visit: Payer: Self-pay

## 2021-10-11 ENCOUNTER — Other Ambulatory Visit: Payer: Self-pay | Admitting: Internal Medicine

## 2021-10-11 ENCOUNTER — Ambulatory Visit (INDEPENDENT_AMBULATORY_CARE_PROVIDER_SITE_OTHER): Payer: Medicare Other

## 2021-10-11 DIAGNOSIS — Z7901 Long term (current) use of anticoagulants: Secondary | ICD-10-CM | POA: Diagnosis not present

## 2021-10-11 DIAGNOSIS — M1A39X0 Chronic gout due to renal impairment, multiple sites, without tophus (tophi): Secondary | ICD-10-CM

## 2021-10-11 LAB — POCT INR: INR: 1.5 — AB (ref 2.0–3.0)

## 2021-10-11 NOTE — Progress Notes (Signed)
Increase dose today to take 1 1/2 tablets and increase dose tomorrow to take 2 tablets and then change weekly dosing to take 1 tablet daily except take 1 1/2 tablets on Mondays and Thursdays. Recheck in 2 weeks per pt request due to being out of town.  ?

## 2021-10-11 NOTE — Patient Instructions (Addendum)
Pre visit review using our clinic review tool, if applicable. No additional management support is needed unless otherwise documented below in the visit note. ? ?Increase dose today to take 1 1/2 tablets and increase dose tomorrow to take 2 tablets and then change weekly dosing to take 1 tablet daily except take 1 1/2 tablets on Mondays and Thursdays. Recheck in 2 weeks  ?

## 2021-10-18 ENCOUNTER — Ambulatory Visit (INDEPENDENT_AMBULATORY_CARE_PROVIDER_SITE_OTHER): Payer: Medicare Other | Admitting: *Deleted

## 2021-10-18 DIAGNOSIS — I10 Essential (primary) hypertension: Secondary | ICD-10-CM

## 2021-10-18 DIAGNOSIS — E785 Hyperlipidemia, unspecified: Secondary | ICD-10-CM

## 2021-10-18 NOTE — Chronic Care Management (AMB) (Signed)
?Chronic Care Management  ? ?CCM RN Visit Note ? ?10/18/2021 ?Name: Rick Edwards MRN: [id number] DOB: 04/21/1956 ? ?Subjective: ?Rick Edwards is a 66 y.o. year old male who is a primary care patient of Janith Lima, MD. The care management team was consulted for assistance with disease management and care coordination needs.   ? ?Engaged with patient by telephone for follow up visit in response to provider referral for case management and/or care coordination services.  ? ?Consent to Services:  ?The patient was given information about Chronic Care Management services, agreed to services, and gave verbal consent prior to initiation of services.  Please see initial visit note for detailed documentation.  ?Patient agreed to services and verbal consent obtained.  ? ?Assessment: Review of patient past medical history, allergies, medications, health status, including review of consultants reports, laboratory and other test data, was performed as part of comprehensive evaluation and provision of chronic care management services.  ?CCM Care Plan ? ?Allergies  ?Allergen Reactions  ? Mesalamine Rash  ?  REACTION: Rash  ? ?Outpatient Encounter Medications as of 10/18/2021  ?Medication Sig Note  ? allopurinol (ZYLOPRIM) 300 MG tablet TAKE 1 TABLET BY MOUTH EVERY DAY   ? amLODipine (NORVASC) 10 MG tablet Take 1 tablet (10 mg total) by mouth at bedtime.   ? atorvastatin (LIPITOR) 40 MG tablet Take 1 tablet (40 mg total) by mouth daily.   ? augmented betamethasone dipropionate (DIPROLENE-AF) 0.05 % cream Apply topically 2 (two) times daily.   ? carvedilol (COREG) 25 MG tablet TAKE 1 TABLET BY MOUTH TWICE A DAY WITH MEALS   ? enoxaparin (LOVENOX) 150 MG/ML injection Inject 1 mL (150 mg total) into the skin daily. Inject 99m into skin the daily as directed by anticoagulation clinic.   ? hyoscyamine (LEVSIN SL) 0.125 MG SL tablet Place under the tongue 2 (two) times daily as needed. 03/17/2021: 03/17/21: Reports has not  needed recently  ? levocetirizine (XYZAL) 5 MG tablet Take 1 tablet (5 mg total) by mouth every evening.   ? potassium chloride (KLOR-CON) 10 MEQ tablet Take 1 tablet (10 mEq total) by mouth 2 (two) times daily.   ? sildenafil (VIAGRA) 100 MG tablet Take 1 tablet (100 mg total) by mouth as needed for erectile dysfunction.   ? spironolactone (ALDACTONE) 25 MG tablet TAKE 1 TABLET (25 MG TOTAL) BY MOUTH DAILY.   ? torsemide (DEMADEX) 20 MG tablet Take 1 tablet (20 mg total) by mouth daily.   ? VASCEPA 1 g capsule TAKE 2 CAPSULES (2 G TOTAL) BY MOUTH 2 (TWO) TIMES DAILY.   ? warfarin (COUMADIN) 5 MG tablet TAKE 1 1/2 TABLETS BY MOUTH DAILY EXCEPT TAKE 1 TABLET ON MONDAYS AND THURSDAYS OR AS DIRECTED BY ANTICOAGULATION CLINIC   ? [DISCONTINUED] fenofibrate (TRICOR) 145 MG tablet Take 1 tablet by mouth once daily   ? [DISCONTINUED] indapamide (LOZOL) 1.25 MG tablet Take 1 tablet (1.25 mg total) by mouth daily. 07/07/2019: palpitations  ? ?No facility-administered encounter medications on file as of 10/18/2021.  ? ?Patient Active Problem List  ? Diagnosis Date Noted  ? Benign prostatic hyperplasia without lower urinary tract symptoms 03/02/2021  ? Intrinsic eczema 01/24/2021  ? LSC (lichen simplex chronicus) 01/18/2021  ? Tinea corporis 01/18/2021  ? Hearing loss due to cerumen impaction, bilateral 06/01/2020  ? Chronic gout due to renal impairment of multiple sites without tophus 06/01/2020  ? Hypertensive urgency, malignant 08/12/2019  ? Type II diabetes mellitus with manifestations ([place]  08/17/2018  ? Atherosclerosis of aorta ([place]) 09/26/2017  ? Drug-induced erectile dysfunction 07/19/2016  ? Pure hypertriglyceridemia 04/05/2016  ? Hyperlipidemia with target LDL less than 70 01/18/2016  ? Proteinuria 03/13/2014  ? OA (osteoarthritis) of knee 11/25/2013  ? PVD (peripheral vascular disease) ([place]) 09/24/2013  ? Encounter for therapeutic drug monitoring 09/16/2013  ? Kidney disease, chronic, stage III (GFR 30-59 ml/min)  ([facility]) 05/26/2013  ? Antithrombin III deficiency ([place]) 05/16/2013  ? Long term (current) use of anticoagulants 11/27/2012  ? 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 Splenic infarction 03/20/2012  ? [place] INTESTINE 03/19/2009  ? Essential hypertension, malignant 03/02/2009  ? ?Conditions to be addressed/monitored:  HTN and HLD ? ?Care Plan : RN Care Manager Plan of Care  ?Updates made by Knox Royalty, RN since 10/18/2021 12:00 AM  ?  ? ?Problem: Chronic Disease Management Needs   ?Priority: Medium  ?  ? ?Long-Range Goal: Ongoing adherence to established plan of care for long term chronic disease management   ?Start Date: 03/17/2021  ?Expected End Date: 03/17/2022  ?Recent Progress: On track  ?Priority: Medium  ?Note:   ?Current Barriers:  ?Chronic Disease Management support and education needs related to HTN and HLD ?Limited health literacy-- requires ongoing reinforcement/ extra time in conversation ?Hard of Hearing ? ?RNCM Clinical Goal(s):  ?Patient will demonstrate ongoing health management independence for HTN/ HLD  through collaboration with RN Care manager, provider, and care team.  ? ?Interventions: ?1:1 collaboration with primary care provider regarding development and update of comprehensive plan of care as evidenced by provider attestation and co-signature ?Inter-disciplinary care team collaboration (see longitudinal plan of care) ?Evaluation of current treatment plan related to  self management and patient's adherence to plan as established by provider ?10/18/21: ?Pain assessment updated: patient continues to deny acute/ chronic pain ?Falls assessment updated: continues to deny new/ recent falls x 12 months; does not use assistive devices; positive reinforcement provided with encouragement to continue efforts; previously provided education around fall risks/ prevention reinforced ?Reviewed upcoming scheduled provider appointments- PCP 01/11/22; patient  verbalizes awareness/ plans to attend as scheduled ? ?Hyperlipidemia:  (Status: 10/18/21: Goal on Track (progressing): YES.) Long Term Goal ?Lab Results  ?Component Value Date  ? CHOL 142 01/18/2021  ? HDL 33.10 (L) 01/18/2021  ? [place] 80 01/18/2021  ? LDLDIRECT 97.0 05/05/2019  ? TRIG 142.0 01/18/2021  ? CHOLHDL 4 01/18/2021  ?Reviewed importance of limiting foods high in cholesterol; ?Reviewed exercise goals and target of 150 minutes per week; ?Confirmed patient continues taking medications for HLD as prescribed and trying to follow heart healthy, low cholesterol/ salt diet ? ?Hypertension: (Status: 10/18/21: Goal on Track (progressing): YES.) Long Term Goal ?Last practice recorded BP readings:  ?BP Readings from Last 3 Encounters:  ?07/27/21 134/77  ?07/11/21 120/80  ?03/02/21 (!) 154/96  ?Most recent eGFR/CrCl: No results found for: EGFR  No components found for: CRCL ? ?Evaluation of current treatment plan related to hypertension self management and patient's adherence to plan as established by provider;   ?Reviewed prescribed diet heart healthy, low sodium, low cholesterol, coumadin- restricted ?Counseled on the importance of exercise goals with target of 150 minutes per week ?Discussed plans with patient for ongoing care management follow up and provided patient with direct contact information for care management team; ?Confirmed patient has continued trying to stay active and occasionally walking for exercise- positive reinforcement provided ?Confirmed patient continues attending regular coumadin clinic appointments ?Patient reports has not  been monitoring blood pressures recently- is currently on vacation; will consider resuming once he returns home: this was encouraged ?Encouraged ongoing physical activity/ exercise; discussed benefit of activity in setting of HTN/ HLD ? ?Patient Goals/Self-Care Activities: ?As evidenced by review of EHR, collaboration with care team, and patient reporting during CCM RN CM  outreach,  ?Patient Reggie will:  ?Take medications as prescribed-- please review the list of medications you are on (listed on these papers) to make sure you are taking the medications the way your are supposed

## 2021-10-18 NOTE — Patient Instructions (Signed)
Visit Information ? ?Rick Edwards, thank you for taking time to talk with me today. Please don't hesitate to contact me if I can be of assistance to you before our next scheduled telephone appointment. ? ?Below are the goals we discussed today:  ?Patient Self-Care Activities: ?Patient Rick Edwards will:  ?Take medications as prescribed-- please review the list of medications you are on (listed on these papers) to make sure you are taking the medications the way your are supposed to ?Keep an updated list of your medications every time you have a doctor's appointment to help you stay on track with your medicine ?Attend all scheduled provider appointments ?Call pharmacy for medication refills ?Attend church or other social activities ?Call your doctor's office for new concerns or questions ?Continue following a heart-healthy, low salt, low cholesterol, and Coumadin-restricted diet ?Continue to make efforts to walk for exercise on a weekly basis ?Keep up the great work preventing falls ? ?Our next scheduled telephone follow up visit/ appointment is scheduled on: Thursday, January 12, 2022 at 2:15 pm- This is a PHONE CALL appointment ? ?If you need to cancel or re-schedule our visit, please call [phone] and our care guide team will be happy to assist you. ?  ?I look forward to hearing about your progress. ?  ?Oneta Rack, RN, BSN, CCRN Alumnus ?[facility] ?([phone]: direct office ? ?If you are experiencing a Mental Health or [place] or need someone to talk to, please  ?call the Suicide and Crisis Lifeline: 988 ?call the [place] National Suicide Prevention Lifeline: (817)245-8175 or TTY: 513-802-4296 TTY 403-814-0936) to talk to a trained counselor ?call 1-800-273-TALK (toll free, 24 hour hotline) ?go to [facility] [address], [place] [phone]) ?call 911  ? ?The patient verbalized understanding of instructions,  educational materials, and care plan provided today and agreed to receive a mailed copy of patient instructions, educational materials, and care plan  ? ?Hypertension, Adult ?High blood pressure (hypertension) is when the force of blood pumping through the arteries is too strong. The arteries are the blood vessels that carry blood from the heart throughout the body. Hypertension forces the heart to work harder to pump blood and may cause arteries to become narrow or stiff. Untreated or uncontrolled hypertension can cause a heart attack, heart failure, a stroke, kidney disease, and other problems. ?A blood pressure reading consists of a higher number over a lower number. Ideally, your blood pressure should be below 120/80. The first ("top") number is called the systolic pressure. It is a measure of the pressure in your arteries as your heart beats. The second ("bottom") number is called the diastolic pressure. It is a measure of the pressure in your arteries as the heart relaxes. ?What are the causes? ?The exact cause of this condition is not known. There are some conditions that result in or are related to high blood pressure. ?What increases the risk? ?Some risk factors for high blood pressure are under your control. The following factors may make you more likely to develop this condition: ?Smoking. ?Having type 2 diabetes mellitus, high cholesterol, or both. ?Not getting enough exercise or physical activity. ?Being overweight. ?Having too much fat, sugar, calories, or salt (sodium) in your diet. ?Drinking too much alcohol. ?Some risk factors for high blood pressure may be difficult or impossible to change. Some of these factors include: ?Having chronic kidney disease. ?Having a family history of high blood pressure. ?Age. Risk  increases with age. ?Race. You may be at higher risk if you are African American. ?Gender. Men are at higher risk than women before age 11. After age 1, women are at higher risk than  men. ?Having obstructive sleep apnea. ?Stress. ?What are the signs or symptoms? ?High blood pressure may not cause symptoms. Very high blood pressure (hypertensive crisis) may cause: ?Headache. ?Anxiety. ?Shortness of breath. ?Nosebleed. ?Nausea and vomiting. ?Vision changes. ?Severe chest pain. ?Seizures. ?How is this diagnosed? ?This condition is diagnosed by measuring your blood pressure while you are seated, with your arm resting on a flat surface, your legs uncrossed, and your feet flat on the floor. The cuff of the blood pressure monitor will be placed directly against the skin of your upper arm at the level of your heart. It should be measured at least twice using the same arm. Certain conditions can cause a difference in blood pressure between your right and left arms. ?Certain factors can cause blood pressure readings to be lower or higher than normal for a short period of time: ?When your blood pressure is higher when you are in a health care provider's office than when you are at home, this is called white coat hypertension. Most people with this condition do not need medicines. ?When your blood pressure is higher at home than when you are in a health care provider's office, this is called masked hypertension. Most people with this condition may need medicines to control blood pressure. ?If you have a high blood pressure reading during one visit or you have normal blood pressure with other risk factors, you may be asked to: ?Return on a different day to have your blood pressure checked again. ?Monitor your blood pressure at home for 1 week or longer. ?If you are diagnosed with hypertension, you may have other blood or imaging tests to help your health care provider understand your overall risk for other conditions. ?How is this treated? ?This condition is treated by making healthy lifestyle changes, such as eating healthy foods, exercising more, and reducing your alcohol intake. Your health care provider  may prescribe medicine if lifestyle changes are not enough to get your blood pressure under control, and if: ?Your systolic blood pressure is above 130. ?Your diastolic blood pressure is above 80. ?Your personal target blood pressure may vary depending on your medical conditions, your age, and other factors. ?Follow these instructions at home: ?Eating and drinking ? ?Eat a diet that is high in fiber and potassium, and low in sodium, added sugar, and fat. An example eating plan is called the DASH (Dietary Approaches to Stop Hypertension) diet. To eat this way: ?Eat plenty of fresh fruits and vegetables. Try to fill one half of your plate at each meal with fruits and vegetables. ?Eat whole grains, such as whole-wheat pasta, brown rice, or whole-grain bread. Fill about one fourth of your plate with whole grains. ?Eat or drink low-fat dairy products, such as skim milk or low-fat yogurt. ?Avoid fatty cuts of meat, processed or cured meats, and poultry with skin. Fill about one fourth of your plate with lean proteins, such as fish, chicken without skin, beans, eggs, or tofu. ?Avoid pre-made and processed foods. These tend to be higher in sodium, added sugar, and fat. ?Reduce your daily sodium intake. Most people with hypertension should eat less than 1,500 mg of sodium a day. ?Do not drink alcohol if: ?Your health care provider tells you not to drink. ?You are pregnant, may be pregnant, or  are planning to become pregnant. ?If you drink alcohol: ?Limit how much you use to: ?0-1 drink a day for women. ?0-2 drinks a day for men. ?Be aware of how much alcohol is in your drink. In the [place] one drink equals one 12 oz bottle of beer (355 mL), one 5 oz glass of wine (148 mL), or one 1? oz glass of hard liquor (44 mL). ?Lifestyle ? ?Work with your health care provider to maintain a healthy body weight or to lose weight. Ask what an ideal weight is for you. ?Get at least 30 minutes of exercise most days of the week. Activities may  include walking, swimming, or biking. ?Include exercise to strengthen your muscles (resistance exercise), such as Pilates or lifting weights, as part of your weekly exercise routine. Try to do these types of

## 2021-10-25 ENCOUNTER — Other Ambulatory Visit: Payer: Self-pay | Admitting: Internal Medicine

## 2021-10-25 DIAGNOSIS — I743 Embolism and thrombosis of arteries of the lower extremities: Secondary | ICD-10-CM

## 2021-10-25 DIAGNOSIS — Z7901 Long term (current) use of anticoagulants: Secondary | ICD-10-CM

## 2021-10-28 ENCOUNTER — Other Ambulatory Visit: Payer: Self-pay

## 2021-10-28 ENCOUNTER — Ambulatory Visit (INDEPENDENT_AMBULATORY_CARE_PROVIDER_SITE_OTHER): Payer: Medicare Other

## 2021-10-28 DIAGNOSIS — Z7901 Long term (current) use of anticoagulants: Secondary | ICD-10-CM

## 2021-10-28 LAB — POCT INR: INR: 1.9 — AB (ref 2.0–3.0)

## 2021-10-28 NOTE — Progress Notes (Signed)
Increase dose today to take 1 1/2 tablets and then  change weekly dose to take 1 tablet daily except take 1 1/2 tablets on Mondays, Wednesdays, and Fridays. Recheck in 4 weeks.  ?

## 2021-10-28 NOTE — Patient Instructions (Addendum)
Pre visit review using our clinic review tool, if applicable. No additional management support is needed unless otherwise documented below in the visit note. ? ?Increase dose today to take 1 1/2 tablets and then  change weekly dose to take 1 tablet daily except take 1 1/2 tablets on Mondays, Wednesdays, and Fridays. Recheck in 4 weeks.  ?

## 2021-11-04 DIAGNOSIS — E785 Hyperlipidemia, unspecified: Secondary | ICD-10-CM

## 2021-11-04 DIAGNOSIS — I10 Essential (primary) hypertension: Secondary | ICD-10-CM | POA: Diagnosis not present

## 2021-11-25 ENCOUNTER — Ambulatory Visit (INDEPENDENT_AMBULATORY_CARE_PROVIDER_SITE_OTHER): Payer: Medicare Other

## 2021-11-25 DIAGNOSIS — Z7901 Long term (current) use of anticoagulants: Secondary | ICD-10-CM | POA: Diagnosis not present

## 2021-11-25 LAB — POCT INR: INR: 2.3 (ref 2.0–3.0)

## 2021-11-25 NOTE — Progress Notes (Signed)
Patient ID: Rick Edwards, male   DOB: Apr 13, 1956, 66 y.o.   MRN: [id number] ? ?Medical screening examination/treatment/procedure(s) were performed by non-physician practitioner and as supervising physician I was immediately available for consultation/collaboration.  I agree with above. Cathlean Cower, MD ? ?

## 2021-11-25 NOTE — Progress Notes (Signed)
Continue 1 tablet daily except take 1 1/2 tablets on Mondays, Wednesdays, and Fridays. Recheck in 4 weeks.  ?

## 2021-11-25 NOTE — Patient Instructions (Addendum)
Pre visit review using our clinic review tool, if applicable. No additional management support is needed unless otherwise documented below in the visit note. ? ?Continue 1 tablet daily except take 1 1/2 tablets on Mondays, Wednesdays, and Fridays. Recheck in 4 weeks.  ?

## 2021-12-06 DIAGNOSIS — K5090 Crohn's disease, unspecified, without complications: Secondary | ICD-10-CM | POA: Diagnosis not present

## 2021-12-06 DIAGNOSIS — R1032 Left lower quadrant pain: Secondary | ICD-10-CM | POA: Diagnosis not present

## 2021-12-12 ENCOUNTER — Other Ambulatory Visit: Payer: Self-pay | Admitting: Internal Medicine

## 2021-12-12 DIAGNOSIS — I10 Essential (primary) hypertension: Secondary | ICD-10-CM

## 2021-12-23 ENCOUNTER — Ambulatory Visit (INDEPENDENT_AMBULATORY_CARE_PROVIDER_SITE_OTHER): Payer: Medicare Other

## 2021-12-23 DIAGNOSIS — Z7901 Long term (current) use of anticoagulants: Secondary | ICD-10-CM | POA: Diagnosis not present

## 2021-12-23 LAB — POCT INR: INR: 2.3 (ref 2.0–3.0)

## 2021-12-23 NOTE — Patient Instructions (Addendum)
Pre visit review using our clinic review tool, if applicable. No additional management support is needed unless otherwise documented below in the visit note.  Continue 1 tablet daily except take 1 1/2 tablets on Mondays, Wednesdays, and Fridays. Recheck in 5 weeks.

## 2021-12-23 NOTE — Progress Notes (Addendum)
Continue 1 tablet daily except take 1 1/2 tablets on Mondays, Wednesdays, and Fridays. Recheck in 5 weeks.

## 2021-12-25 ENCOUNTER — Other Ambulatory Visit: Payer: Self-pay | Admitting: Internal Medicine

## 2021-12-25 DIAGNOSIS — E876 Hypokalemia: Secondary | ICD-10-CM

## 2021-12-25 DIAGNOSIS — I10 Essential (primary) hypertension: Secondary | ICD-10-CM

## 2022-01-06 ENCOUNTER — Other Ambulatory Visit: Payer: Self-pay | Admitting: Internal Medicine

## 2022-01-06 DIAGNOSIS — E785 Hyperlipidemia, unspecified: Secondary | ICD-10-CM

## 2022-01-07 ENCOUNTER — Other Ambulatory Visit: Payer: Self-pay | Admitting: Internal Medicine

## 2022-01-07 DIAGNOSIS — M1A39X0 Chronic gout due to renal impairment, multiple sites, without tophus (tophi): Secondary | ICD-10-CM

## 2022-01-11 ENCOUNTER — Ambulatory Visit (INDEPENDENT_AMBULATORY_CARE_PROVIDER_SITE_OTHER): Payer: Medicare Other | Admitting: Internal Medicine

## 2022-01-11 ENCOUNTER — Encounter: Payer: Self-pay | Admitting: Internal Medicine

## 2022-01-11 VITALS — BP 160/96 | HR 74 | Temp 98.1°F | Resp 16 | Ht 69.0 in | Wt 228.0 lb

## 2022-01-11 DIAGNOSIS — M1A39X0 Chronic gout due to renal impairment, multiple sites, without tophus (tophi): Secondary | ICD-10-CM | POA: Diagnosis not present

## 2022-01-11 DIAGNOSIS — E781 Pure hyperglyceridemia: Secondary | ICD-10-CM

## 2022-01-11 DIAGNOSIS — E785 Hyperlipidemia, unspecified: Secondary | ICD-10-CM | POA: Diagnosis not present

## 2022-01-11 DIAGNOSIS — R808 Other proteinuria: Secondary | ICD-10-CM | POA: Diagnosis not present

## 2022-01-11 DIAGNOSIS — R9431 Abnormal electrocardiogram [ECG] [EKG]: Secondary | ICD-10-CM

## 2022-01-11 DIAGNOSIS — N400 Enlarged prostate without lower urinary tract symptoms: Secondary | ICD-10-CM

## 2022-01-11 DIAGNOSIS — D6859 Other primary thrombophilia: Secondary | ICD-10-CM

## 2022-01-11 DIAGNOSIS — N1831 Chronic kidney disease, stage 3a: Secondary | ICD-10-CM | POA: Diagnosis not present

## 2022-01-11 DIAGNOSIS — I743 Embolism and thrombosis of arteries of the lower extremities: Secondary | ICD-10-CM

## 2022-01-11 DIAGNOSIS — I10 Essential (primary) hypertension: Secondary | ICD-10-CM | POA: Diagnosis not present

## 2022-01-11 LAB — URINALYSIS, ROUTINE W REFLEX MICROSCOPIC
Bilirubin Urine: NEGATIVE
Hgb urine dipstick: NEGATIVE
Ketones, ur: NEGATIVE
Leukocytes,Ua: NEGATIVE
Nitrite: NEGATIVE
RBC / HPF: NONE SEEN (ref 0–?)
Specific Gravity, Urine: 1.01 (ref 1.000–1.030)
Total Protein, Urine: NEGATIVE
Urine Glucose: NEGATIVE
Urobilinogen, UA: 0.2 (ref 0.0–1.0)
pH: 6 (ref 5.0–8.0)

## 2022-01-11 LAB — LIPID PANEL
Cholesterol: 156 mg/dL (ref 0–200)
HDL: 37.7 mg/dL — ABNORMAL LOW (ref >39.00)
LDL Cholesterol: 98 mg/dL (ref 0–99)
NonHDL: 117.81
Total CHOL/HDL Ratio: 4
Triglycerides: 101 mg/dL (ref 0.0–149.0)
VLDL: 20.2 mg/dL (ref 0.0–40.0)

## 2022-01-11 LAB — BASIC METABOLIC PANEL
BUN: 13 mg/dL (ref 6–23)
CO2: 28 mEq/L (ref 19–32)
Calcium: 9.3 mg/dL (ref 8.4–10.5)
Chloride: 97 mEq/L (ref 96–112)
Creatinine, Ser: 1 mg/dL (ref 0.40–1.50)
GFR: 80.06 mL/min (ref >60.00)
Glucose, Bld: 99 mg/dL (ref 70–99)
Potassium: 4.1 mEq/L (ref 3.5–5.1)
Sodium: 135 mEq/L (ref 135–145)

## 2022-01-11 LAB — PSA: PSA: 2.99 ng/mL (ref 0.10–4.00)

## 2022-01-11 LAB — URIC ACID: Uric Acid, Serum: 5.3 mg/dL (ref 4.0–7.8)

## 2022-01-11 LAB — TSH: TSH: 1.45 u[IU]/mL (ref 0.35–5.50)

## 2022-01-11 NOTE — Progress Notes (Signed)
Subjective:  Patient ID: Rick Edwards, male    DOB: 1956-08-04  Age: 66 y.o. MRN: [id number]  CC: Hypertension   HPI Rick Edwards presents for f/up -  It was a difficult interview today because we did not have a sign language interpreter here and he says his hearing aid is broken and he had difficult hearing.  He does indicate that he has had no recent episodes of chest pain, shortness of breath.  According to prescription refills it looks like he may no longer be taking amlodipine.  Outpatient Medications Prior to Visit  Medication Sig Dispense Refill   allopurinol (ZYLOPRIM) 300 MG tablet TAKE 1 TABLET BY MOUTH EVERY DAY 90 tablet 0   atorvastatin (LIPITOR) 40 MG tablet TAKE 1 TABLET BY MOUTH EVERY DAY 90 tablet 0   hyoscyamine (LEVSIN SL) 0.125 MG SL tablet Place under the tongue 2 (two) times daily as needed.     levocetirizine (XYZAL) 5 MG tablet Take 1 tablet (5 mg total) by mouth every evening. 90 tablet 1   potassium chloride (KLOR-CON) 10 MEQ tablet TAKE 1 TABLET BY MOUTH 2 TIMES DAILY. 180 tablet 0   sildenafil (VIAGRA) 100 MG tablet Take 1 tablet (100 mg total) by mouth as needed for erectile dysfunction. 6 tablet 3   spironolactone (ALDACTONE) 25 MG tablet TAKE 1 TABLET (25 MG TOTAL) BY MOUTH DAILY. 90 tablet 1   torsemide (DEMADEX) 20 MG tablet TAKE 1 TABLET BY MOUTH EVERY DAY 90 tablet 0   VASCEPA 1 g capsule TAKE 2 CAPSULES (2 G TOTAL) BY MOUTH 2 (TWO) TIMES DAILY. 360 capsule 1   warfarin (COUMADIN) 5 MG tablet TAKE 1.5 TABLETS DAILY, EXCEPT TAKE 1 TABLET ON MON, WED, AND FRI AS DIRECTED BY CLINIC. 108 tablet 1   amLODipine (NORVASC) 10 MG tablet Take 1 tablet (10 mg total) by mouth at bedtime. 90 tablet 1   augmented betamethasone dipropionate (DIPROLENE-AF) 0.05 % cream Apply topically 2 (two) times daily. 50 g 1   carvedilol (COREG) 25 MG tablet TAKE 1 TABLET BY MOUTH TWICE A DAY WITH MEALS 180 tablet 1   enoxaparin (LOVENOX) 150 MG/ML injection Inject 1 mL  (150 mg total) into the skin daily. Inject 84m into skin the daily as directed by anticoagulation clinic. 8 mL 0   No facility-administered medications prior to visit.    ROS Review of Systems  Constitutional:  Negative for chills, diaphoresis, fatigue and fever.  HENT: Negative.    Eyes: Negative.   Respiratory:  Negative for chest tightness, shortness of breath and wheezing.   Cardiovascular:  Positive for leg swelling. Negative for chest pain and palpitations.  Gastrointestinal:  Negative for abdominal pain, diarrhea, nausea and vomiting.  Endocrine: Negative.   Genitourinary: Negative.  Negative for difficulty urinating, dysuria and hematuria.  Musculoskeletal: Negative.   Skin: Negative.   Neurological:  Negative for dizziness, weakness, light-headedness and headaches.  Hematological:  Negative for adenopathy. Does not bruise/bleed easily.  Psychiatric/Behavioral: Negative.      Objective:  BP (!) 160/96 (BP Location: Left Arm, Patient Position: Sitting, Cuff Size: Large)   Pulse 74   Temp 98.1 F (36.7 C) (Oral)   Resp 16   Ht '5\' 9"'$  (1.753 m)   Wt 228 lb (103.4 kg)   SpO2 93%   BMI 33.67 kg/m   BP Readings from Last 3 Encounters:  01/11/22 (!) 160/96  07/27/21 134/77  07/11/21 120/80    Wt Readings from Last 3 Encounters:  01/11/22 228 lb (103.4 kg)  07/27/21 210 lb (95.3 kg)  07/11/21 215 lb (97.5 kg)    Physical Exam Vitals reviewed.  HENT:     Nose: Nose normal.     Mouth/Throat:     Mouth: Mucous membranes are moist.  Eyes:     General: No scleral icterus.    Conjunctiva/sclera: Conjunctivae normal.  Cardiovascular:     Rate and Rhythm: Normal rate and regular rhythm.     Pulses:          Dorsalis pedis pulses are 1+ on the right side and 1+ on the left side.       Posterior tibial pulses are 1+ on the right side and 1+ on the left side.     Heart sounds: No murmur heard.    Comments: EKG- NSR, 63 bpm LAD- new ?Q wave in V1 - new Flat T waves  in V5V6 - not new No LVH Pulmonary:     Effort: Pulmonary effort is normal.     Breath sounds: No stridor. No wheezing, rhonchi or rales.  Abdominal:     General: Abdomen is flat.     Palpations: There is no mass.     Tenderness: There is no abdominal tenderness. There is no guarding.     Hernia: No hernia is present.  Musculoskeletal:     Cervical back: Neck supple. No tenderness.     Right lower leg: Edema (trace pitting) present.     Left lower leg: Edema (trace pitting) present.     Right foot: Deformity present.  Feet:     Right foot:     Skin integrity: Skin integrity normal.     Toenail Condition: Right toenails are normal.     Left foot:     Skin integrity: Skin integrity normal.     Toenail Condition: Left toenails are normal.  Lymphadenopathy:     Cervical: No cervical adenopathy.  Skin:    General: Skin is warm.  Neurological:     General: No focal deficit present.     Mental Status: He is alert. Mental status is at baseline.  Psychiatric:        Mood and Affect: Mood normal.        Behavior: Behavior normal.     Lab Results  Component Value Date   WBC 8.3 07/11/2021   HGB 14.8 07/11/2021   HCT 44.6 07/11/2021   PLT 418.0 (H) 07/11/2021   GLUCOSE 99 01/11/2022   CHOL 156 01/11/2022   TRIG 101.0 01/11/2022   HDL 37.70 (L) 01/11/2022   LDLDIRECT 97.0 05/05/2019   LDLCALC 98 01/11/2022   ALT 22 01/18/2021   AST 15 01/18/2021   NA 135 01/11/2022   K 4.1 01/11/2022   CL 97 01/11/2022   CREATININE 1.00 01/11/2022   BUN 13 01/11/2022   CO2 28 01/11/2022   TSH 1.45 01/11/2022   PSA 2.99 01/11/2022   INR 2.3 12/23/2021   HGBA1C 5.9 07/11/2021   MICROALBUR 0.7 07/11/2021    No results found.  Assessment & Plan:   Rick Edwards was seen today for hypertension.  Diagnoses and all orders for this visit:  Essential hypertension, malignant- His blood pressure is not adequately well controlled.  Will check labs to screen for secondary causes and endorgan  damage.  I have asked him to be more compliant with his antihypertensives. -     Basic metabolic panel; Future -     Urinalysis, Routine w reflex microscopic; Future -  Aldosterone + renin activity w/ ratio; Future -     EKG 12-Lead -     TSH; Future -     TSH -     Aldosterone + renin activity w/ ratio -     Urinalysis, Routine w reflex microscopic -     Basic metabolic panel -     amLODipine (NORVASC) 10 MG tablet; Take 1 tablet (10 mg total) by mouth at bedtime. -     carvedilol (COREG) 25 MG tablet; Take 1 tablet (25 mg total) by mouth 2 (two) times daily with a meal.  Stage 3a chronic kidney disease ([place])- His renal function has improved and is normal now. -     Basic metabolic panel; Future -     Urinalysis, Routine w reflex microscopic; Future -     Urinalysis, Routine w reflex microscopic -     Basic metabolic panel  Benign prostatic hyperplasia without lower urinary tract symptoms- His PSA is normal. -     PSA; Future -     Urinalysis, Routine w reflex microscopic; Future -     Urinalysis, Routine w reflex microscopic -     PSA  Antithrombin III deficiency ([place])- Will continue anticoagulation.  Chronic gout due to renal impairment of multiple sites without tophus -     Uric acid; Future -     Uric acid  Other proteinuria -     Urinalysis, Routine w reflex microscopic; Future -     Urinalysis, Routine w reflex microscopic  Pure hypertriglyceridemia -     Lipid panel; Future -     Lipid panel  Hyperlipidemia with target LDL less than 70- LDL goal achieved. Doing well on the statin  -     Lipid panel; Future -     TSH; Future -     TSH -     Lipid panel  Abnormal electrocardiogram- Will evaluate for ischemia. -     MYOCARDIAL PERFUSION IMAGING; Future -     Cardiac Stress Test: Informed Consent Details: Physician/Practitioner Attestation; Transcribe to consent form and obtain patient signature; Future   I have discontinued Rick Meigs. [place] "Reggie"'s  augmented betamethasone dipropionate and enoxaparin. I have also changed his carvedilol. Additionally, I am having him maintain his hyoscyamine, levocetirizine, Vascepa, sildenafil, spironolactone, warfarin, torsemide, potassium chloride, atorvastatin, allopurinol, and amLODipine.  Meds ordered this encounter  Medications   amLODipine (NORVASC) 10 MG tablet    Sig: Take 1 tablet (10 mg total) by mouth at bedtime.    Dispense:  90 tablet    Refill:  1   carvedilol (COREG) 25 MG tablet    Sig: Take 1 tablet (25 mg total) by mouth 2 (two) times daily with a meal.    Dispense:  180 tablet    Refill:  1     Follow-up: Return in about 3 months (around 04/13/2022).  Scarlette Calico, MD

## 2022-01-11 NOTE — Patient Instructions (Signed)
Hypertension, Adult High blood pressure (hypertension) is when the force of blood pumping through the arteries is too strong. The arteries are the blood vessels that carry blood from the heart throughout the body. Hypertension forces the heart to work harder to pump blood and may cause arteries to become narrow or stiff. Untreated or uncontrolled hypertension can lead to a heart attack, heart failure, a stroke, kidney disease, and other problems. A blood pressure reading consists of a higher number over a lower number. Ideally, your blood pressure should be below 120/80. The first ("top") number is called the systolic pressure. It is a measure of the pressure in your arteries as your heart beats. The second ("bottom") number is called the diastolic pressure. It is a measure of the pressure in your arteries as the heart relaxes. What are the causes? The exact cause of this condition is not known. There are some conditions that result in high blood pressure. What increases the risk? Certain factors may make you more likely to develop high blood pressure. Some of these risk factors are under your control, including: Smoking. Not getting enough exercise or physical activity. Being overweight. Having too much fat, sugar, calories, or salt (sodium) in your diet. Drinking too much alcohol. Other risk factors include: Having a personal history of heart disease, diabetes, high cholesterol, or kidney disease. Stress. Having a family history of high blood pressure and high cholesterol. Having obstructive sleep apnea. Age. The risk increases with age. What are the signs or symptoms? High blood pressure may not cause symptoms. Very high blood pressure (hypertensive crisis) may cause: Headache. Fast or irregular heartbeats (palpitations). Shortness of breath. Nosebleed. Nausea and vomiting. Vision changes. Severe chest pain, dizziness, and seizures. How is this diagnosed? This condition is diagnosed by  measuring your blood pressure while you are seated, with your arm resting on a flat surface, your legs uncrossed, and your feet flat on the floor. The cuff of the blood pressure monitor will be placed directly against the skin of your upper arm at the level of your heart. Blood pressure should be measured at least twice using the same arm. Certain conditions can cause a difference in blood pressure between your right and left arms. If you have a high blood pressure reading during one visit or you have normal blood pressure with other risk factors, you may be asked to: Return on a different day to have your blood pressure checked again. Monitor your blood pressure at home for 1 week or longer. If you are diagnosed with hypertension, you may have other blood or imaging tests to help your health care provider understand your overall risk for other conditions. How is this treated? This condition is treated by making healthy lifestyle changes, such as eating healthy foods, exercising more, and reducing your alcohol intake. You may be referred for counseling on a healthy diet and physical activity. Your health care provider may prescribe medicine if lifestyle changes are not enough to get your blood pressure under control and if: Your systolic blood pressure is above 130. Your diastolic blood pressure is above 80. Your personal target blood pressure may vary depending on your medical conditions, your age, and other factors. Follow these instructions at home: Eating and drinking  Eat a diet that is high in fiber and potassium, and low in sodium, added sugar, and fat. An example of this eating plan is called the DASH diet. DASH stands for Dietary Approaches to Stop Hypertension. To eat this way: Eat   plenty of fresh fruits and vegetables. Try to fill one half of your plate at each meal with fruits and vegetables. Eat whole grains, such as whole-wheat pasta, brown rice, or whole-grain bread. Fill about one  fourth of your plate with whole grains. Eat or drink low-fat dairy products, such as skim milk or low-fat yogurt. Avoid fatty cuts of meat, processed or cured meats, and poultry with skin. Fill about one fourth of your plate with lean proteins, such as fish, chicken without skin, beans, eggs, or tofu. Avoid pre-made and processed foods. These tend to be higher in sodium, added sugar, and fat. Reduce your daily sodium intake. Many people with hypertension should eat less than 1,500 mg of sodium a day. Do not drink alcohol if: Your health care provider tells you not to drink. You are pregnant, may be pregnant, or are planning to become pregnant. If you drink alcohol: Limit how much you have to: 0-1 drink a day for women. 0-2 drinks a day for men. Know how much alcohol is in your drink. In the [place] one drink equals one 12 oz bottle of beer (355 mL), one 5 oz glass of wine (148 mL), or one 1 oz glass of hard liquor (44 mL). Lifestyle  Work with your health care provider to maintain a healthy body weight or to lose weight. Ask what an ideal weight is for you. Get at least 30 minutes of exercise that causes your heart to beat faster (aerobic exercise) most days of the week. Activities may include walking, swimming, or biking. Include exercise to strengthen your muscles (resistance exercise), such as Pilates or lifting weights, as part of your weekly exercise routine. Try to do these types of exercises for 30 minutes at least 3 days a week. Do not use any products that contain nicotine or tobacco. These products include cigarettes, chewing tobacco, and vaping devices, such as e-cigarettes. If you need help quitting, ask your health care provider. Monitor your blood pressure at home as told by your health care provider. Keep all follow-up visits. This is important. Medicines Take over-the-counter and prescription medicines only as told by your health care provider. Follow directions carefully. Blood  pressure medicines must be taken as prescribed. Do not skip doses of blood pressure medicine. Doing this puts you at risk for problems and can make the medicine less effective. Ask your health care provider about side effects or reactions to medicines that you should watch for. Contact a health care provider if you: Think you are having a reaction to a medicine you are taking. Have headaches that keep coming back (recurring). Feel dizzy. Have swelling in your ankles. Have trouble with your vision. Get help right away if you: Develop a severe headache or confusion. Have unusual weakness or numbness. Feel faint. Have severe pain in your chest or abdomen. Vomit repeatedly. Have trouble breathing. These symptoms may be an emergency. Get help right away. Call 911. Do not wait to see if the symptoms will go away. Do not drive yourself to the hospital. Summary Hypertension is when the force of blood pumping through your arteries is too strong. If this condition is not controlled, it may put you at risk for serious complications. Your personal target blood pressure may vary depending on your medical conditions, your age, and other factors. For most people, a normal blood pressure is less than 120/80. Hypertension is treated with lifestyle changes, medicines, or a combination of both. Lifestyle changes include losing weight, eating a healthy,   low-sodium diet, exercising more, and limiting alcohol. This information is not intended to replace advice given to you by your health care provider. Make sure you discuss any questions you have with your health care provider. Document Revised: 05/31/2021 Document Reviewed: 05/31/2021 Elsevier Patient Education  2023 [employer] Inc.  

## 2022-01-12 ENCOUNTER — Ambulatory Visit (INDEPENDENT_AMBULATORY_CARE_PROVIDER_SITE_OTHER): Payer: Medicare Other | Admitting: *Deleted

## 2022-01-12 DIAGNOSIS — I10 Essential (primary) hypertension: Secondary | ICD-10-CM

## 2022-01-12 DIAGNOSIS — E785 Hyperlipidemia, unspecified: Secondary | ICD-10-CM

## 2022-01-12 NOTE — Patient Instructions (Addendum)
Visit Information  Rick Edwards, thank you for taking time to talk with me today. Please don't hesitate to contact me if I can be of assistance to you before our next scheduled telephone appointment  Below are the goals we discussed today:  Patient Self-Care Activities: Patient Rick Edwards will:  Take medications as prescribed Keep an updated list of your medications every time you have a doctor's appointment to help you stay on track with your medicine Attend all scheduled provider appointments Call pharmacy for medication refills Attend church or other social activities Call your doctor's office for new concerns or questions Continue following a heart-healthy, low salt, low cholesterol, and Coumadin-restricted diet Continue to make efforts to walk for exercise on a weekly basis Keep up the great work preventing falls  Our next scheduled telephone follow up visit/ appointment is scheduled on:   Tuesday, May 02, 2022 at 2:15 pm- This is a PHONE [place] appointment  If you need to cancel or re-schedule our visit, please call [phone] and our care guide team will be happy to assist you.   I look forward to hearing about your progress.   Oneta Rack, RN, BSN, [place] [phone]: direct office  If you are experiencing a Mental Health or [place] or need someone to talk to, please  call the Suicide and Crisis Lifeline: 988 call the [place] National Suicide Prevention Lifeline: 631-004-8595 or TTY: 4351481446 TTY (540) 677-3732) to talk to a trained counselor call 1-800-273-TALK (toll free, 24 hour hotline) go to [facility] [address], [place] [phone]) call 911   The patient verbalized understanding of instructions, educational materials, and care plan provided today and agreed to receive a mailed copy of patient instructions, educational materials, and care  plan  Cholesterol Content in Foods Cholesterol is a waxy, fat-like substance that helps to carry fat in the blood. The body needs cholesterol in small amounts, but too much cholesterol can cause damage to the arteries and heart. What foods have cholesterol?  Cholesterol is found in animal-based foods, such as meat, seafood, and dairy. Generally, low-fat dairy and lean meats have less cholesterol than full-fat dairy and fatty meats. The milligrams of cholesterol per serving (mg per serving) of common cholesterol-containing foods are listed below. Meats and other proteins Egg -- one large whole egg has 186 mg. Veal shank -- 4 oz (113 g) has 141 mg. Lean ground [place] (93% lean) -- 4 oz (113 g) has 118 mg. Fat-trimmed lamb loin -- 4 oz (113 g) has 106 mg. Lean ground beef (90% lean) -- 4 oz (113 g) has 100 mg. Lobster -- 3.5 oz (99 g) has 90 mg. Pork loin chops -- 4 oz (113 g) has 86 mg. Canned salmon -- 3.5 oz (99 g) has 83 mg. Fat-trimmed beef top loin -- 4 oz (113 g) has 78 mg. Frankfurter -- 1 frank (3.5 oz or 99 g) has 77 mg. Crab -- 3.5 oz (99 g) has 71 mg. Roasted chicken without skin, white meat -- 4 oz (113 g) has 66 mg. Light bologna -- 2 oz (57 g) has 45 mg. Deli-cut [place] -- 2 oz (57 g) has 31 mg. Canned tuna -- 3.5 oz (99 g) has 31 mg. Berniece Salines -- 1 oz (28 g) has 29 mg. Oysters and mussels (raw) -- 3.5 oz (99 g) has 25 mg. Mackerel -- 1 oz (28 g) has 22 mg. Trout -- 1 oz (  28 g) has 20 mg. Pork sausage -- 1 link (1 oz or 28 g) has 17 mg. Salmon -- 1 oz (28 g) has 16 mg. Tilapia -- 1 oz (28 g) has 14 mg. Dairy Soft-serve ice cream --  cup (4 oz or 86 g) has 103 mg. Whole-milk yogurt -- 1 cup (8 oz or 245 g) has 29 mg. Cheddar cheese -- 1 oz (28 g) has 28 mg. American cheese -- 1 oz (28 g) has 28 mg. Whole milk -- 1 cup (8 oz or 250 mL) has 23 mg. 2% milk -- 1 cup (8 oz or 250 mL) has 18 mg. Cream cheese -- 1 tablespoon (Tbsp) (14.5 g) has 15 mg. Cottage cheese --  cup (4  oz or 113 g) has 14 mg. Low-fat (1%) milk -- 1 cup (8 oz or 250 mL) has 10 mg. Sour cream -- 1 Tbsp (12 g) has 8.5 mg. Low-fat yogurt -- 1 cup (8 oz or 245 g) has 8 mg. Nonfat Greek yogurt -- 1 cup (8 oz or 228 g) has 7 mg. Half-and-half cream -- 1 Tbsp (15 mL) has 5 mg. Fats and oils Cod liver oil -- 1 tablespoon (Tbsp) (13.6 g) has 82 mg. Butter -- 1 Tbsp (14 g) has 15 mg. Lard -- 1 Tbsp (12.8 g) has 14 mg. Bacon grease -- 1 Tbsp (12.9 g) has 14 mg. Mayonnaise -- 1 Tbsp (13.8 g) has 5-10 mg. Margarine -- 1 Tbsp (14 g) has 3-10 mg. The items listed above may not be a complete list of foods with cholesterol. Exact amounts of cholesterol in these foods may vary depending on specific ingredients and brands. Contact a dietitian for more information. What foods do not have cholesterol? Most plant-based foods do not have cholesterol unless you combine them with a food that has cholesterol. Foods without cholesterol include: Grains and cereals. Vegetables. Fruits. Vegetable oils, such as olive, canola, and sunflower oil. Legumes, such as peas, beans, and lentils. Nuts and seeds. Egg whites. The items listed above may not be a complete list of foods that do not have cholesterol. Contact a dietitian for more information. Summary The body needs cholesterol in small amounts, but too much cholesterol can cause damage to the arteries and heart. Cholesterol is found in animal-based foods, such as meat, seafood, and dairy. Generally, low-fat dairy and lean meats have less cholesterol than full-fat dairy and fatty meats. This information is not intended to replace advice given to you by your health care provider. Make sure you discuss any questions you have with your health care provider. Document Revised: 12/03/2020 Document Reviewed: 12/03/2020 Elsevier Patient Education  [place].

## 2022-01-13 MED ORDER — AMLODIPINE BESYLATE 10 MG PO TABS: 10.0000 mg | ORAL_TABLET | Freq: Every day | ORAL | 1 refills | Status: DC

## 2022-01-13 MED ORDER — CARVEDILOL 25 MG PO TABS: 25.0000 mg | ORAL_TABLET | Freq: Two times a day (BID) | ORAL | 1 refills | Status: DC

## 2022-01-13 NOTE — Chronic Care Management (AMB) (Signed)
Chronic Care Management   CCM RN Visit Note  01/13/2022 Name: Rick Edwards MRN: [id number] DOB: Nov 12, 1955  Subjective: Rick Edwards is a 66 y.o. year old male who is a primary care patient of Rick Lima, MD. The care management team was consulted for assistance with disease management and care coordination needs.    Engaged with patient by telephone for follow up visit in response to provider referral for case management and/or care coordination services.   Consent to Services:  The patient was given information about Chronic Care Management services, agreed to services, and gave verbal consent prior to initiation of services.  Please see initial visit note for detailed documentation.  Patient agreed to services and verbal consent obtained.   Assessment: Review of patient past medical history, allergies, medications, health status, including review of consultants reports, laboratory and other test data, was performed as part of comprehensive evaluation and provision of chronic care management services.   SDOH (Social Determinants of Health) assessments and interventions performed:  SDOH Interventions    Flowsheet Row Most Recent Value  SDOH Interventions   Food Insecurity Interventions Intervention Not Indicated  [continues to deny food insecurity]  Housing Interventions Intervention Not Indicated  [continues to reside alone with family within walking distance,  denies housing concerns]  Transportation Interventions Intervention Not Indicated  [Patient continues to drive self]     CCM Care Plan  Allergies  Allergen Reactions   Mesalamine Rash    REACTION: Rash   Outpatient Encounter Medications as of 01/12/2022  Medication Sig Note   allopurinol (ZYLOPRIM) 300 MG tablet TAKE 1 TABLET BY MOUTH EVERY DAY    amLODipine (NORVASC) 10 MG tablet Take 1 tablet (10 mg total) by mouth at bedtime.    atorvastatin (LIPITOR) 40 MG tablet TAKE 1 TABLET BY MOUTH EVERY DAY     augmented betamethasone dipropionate (DIPROLENE-AF) 0.05 % cream Apply topically 2 (two) times daily.    carvedilol (COREG) 25 MG tablet TAKE 1 TABLET BY MOUTH TWICE A DAY WITH MEALS    enoxaparin (LOVENOX) 150 MG/ML injection Inject 1 mL (150 mg total) into the skin daily. Inject 73m into skin the daily as directed by anticoagulation clinic.    hyoscyamine (LEVSIN SL) 0.125 MG SL tablet Place under the tongue 2 (two) times daily as needed. 03/17/2021: 03/17/21: Reports has not needed recently   levocetirizine (XYZAL) 5 MG tablet Take 1 tablet (5 mg total) by mouth every evening.    potassium chloride (KLOR-CON) 10 MEQ tablet TAKE 1 TABLET BY MOUTH 2 TIMES DAILY.    sildenafil (VIAGRA) 100 MG tablet Take 1 tablet (100 mg total) by mouth as needed for erectile dysfunction.    spironolactone (ALDACTONE) 25 MG tablet TAKE 1 TABLET (25 MG TOTAL) BY MOUTH DAILY.    torsemide (DEMADEX) 20 MG tablet TAKE 1 TABLET BY MOUTH EVERY DAY    VASCEPA 1 g capsule TAKE 2 CAPSULES (2 G TOTAL) BY MOUTH 2 (TWO) TIMES DAILY.    warfarin (COUMADIN) 5 MG tablet TAKE 1.5 TABLETS DAILY, EXCEPT TAKE 1 TABLET ON MON, WED, AND FRI AS DIRECTED BY CLINIC.    [DISCONTINUED] fenofibrate (TRICOR) 145 MG tablet Take 1 tablet by mouth once daily    [DISCONTINUED] indapamide (LOZOL) 1.25 MG tablet Take 1 tablet (1.25 mg total) by mouth daily. 07/07/2019: palpitations   No facility-administered encounter medications on file as of 01/12/2022.   Patient Active Problem List   Diagnosis Date Noted   Benign prostatic hyperplasia  without lower urinary tract symptoms 03/02/2021   Intrinsic eczema 47/04/6282   LSC (lichen simplex chronicus) 01/18/2021   Tinea corporis 01/18/2021   Hearing loss due to cerumen impaction, bilateral 06/01/2020   Chronic gout due to renal impairment of multiple sites without tophus 06/01/2020   Hypertensive urgency, malignant 08/12/2019   Type II diabetes mellitus with manifestations ([place]) 08/17/2018    Atherosclerosis of aorta ([place]) 09/26/2017   Drug-induced erectile dysfunction 07/19/2016   Pure hypertriglyceridemia 04/05/2016   Hyperlipidemia with target LDL less than 70 01/18/2016   Proteinuria 03/13/2014   OA (osteoarthritis) of knee 11/25/2013   PVD (peripheral vascular disease) ([place]) 09/24/2013   Encounter for therapeutic drug monitoring 09/16/2013   Kidney disease, chronic, stage III (GFR 30-59 ml/min) ([place]) 05/26/2013   Antithrombin III deficiency ([place]) 05/16/2013   Long term (current) use of anticoagulants 11/27/2012   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 ([place]) 04/10/2012   Splenic infarction 03/20/2012   CR[facility]'S [place] 03/19/2009   Essential hypertension, malignant 03/02/2009   Conditions to be addressed/monitored:  HTN and HLD  Care Plan : RN Care Manager Plan of Care  Updates made by Knox Royalty, RN since 01/13/2022 12:00 AM     Problem: Chronic Disease Management Needs   Priority: Medium     Long-Range Goal: Ongoing adherence to established plan of care for long term chronic disease management   Start Date: 01/12/2022  Expected End Date: 01/13/2023  Recent Progress: On track  Priority: Medium  Note:   Current Barriers:  Chronic Disease Management support and education needs related to HTN and HLD Limited health literacy-- requires ongoing reinforcement/ extra time in conversation Hard of Hearing  RNCM Clinical Goal(s):  Patient will demonstrate ongoing health management independence for HTN/ HLD  through collaboration with RN Care manager, provider, and care team 01/12/22: CCM RN CM patient goals re-established and extended  Interventions: 1:1 collaboration with primary care provider regarding development and update of comprehensive plan of care as evidenced by provider attestation and co-signature Inter-disciplinary care team collaboration (see longitudinal plan of care) Evaluation of current treatment plan related to  self  management and patient's adherence to plan as established by provider Review of patient status, including review of consultants reports, relevant laboratory and other test results, and medications completed SDOH updated: no new/ unmet concerns identified Depression screening updated: no concerns identified Pain assessment updated: denies pain today Falls assessment updated: continues to deny new/ recent falls x 12 months- not currently using assistive devices;  positive reinforcement provided with encouragement to continue efforts at fall prevention; previously provided education around fall risks/ prevention reinforced Medications discussed: reports continues to independently self-manage and denies current concerns/ issues/ questions around medications; endorses adherence to taking all medications as prescribed Reviewed recent PCP office visit 01/11/22: patient verbalizes good understanding of post-visit instructions and denies questions; encouraged him to schedule 28-monthPCP office visit follow up as advised- he will do Reviewed upcoming scheduled provider appointments: 01/27/22: coumadin clinic; patient confirms is aware of all and has plans to attend as scheduled Discussed plans with patient for ongoing care management follow up and provided patient with direct contact information for care management team     Hyperlipidemia:  (Status: 01/12/22: Goal on Track (progressing): YES.) Long Term Goal Lab Results  Component Value Date   CHOL 156 01/11/2022   HDL 37.70 (L) 01/11/2022   LDLCALC 98 01/11/2022   LDLDIRECT 97.0 05/05/2019   TRIG 101.0 01/11/2022   CHOLHDL 4  01/11/2022  Reviewed importance of limiting foods high in cholesterol; Reviewed exercise goals and target of 150 minutes per week; Confirmed patient continues taking medications for HLD as prescribed and trying to follow heart healthy, low cholesterol/ salt diet Patient requests additional information on foods with cholesterol:  provided previously; will re-send: patient will benefit from ongoing education, support, reinforcement/ reminding  Hypertension: (Status: 01/12/22: Goal on Track (progressing): YES.) Long Term Goal Last practice recorded BP readings:  BP Readings from Last 3 Encounters:  01/11/22 (!) 160/96  07/27/21 134/77  07/11/21 120/80  Most recent eGFR/CrCl: No results found for: EGFR  No components found for: CRCL  Evaluation of current treatment plan related to hypertension self management and patient's adherence to plan as established by provider;   Reviewed prescribed diet heart healthy, low salt, low cholesterol Counseled on the importance of exercise goals with target of 150 minutes per week Confirmed patient has continued trying to stay active and occasionally walking for exercise- positive reinforcement provided Confirmed patient continues attending regular coumadin clinic appointments Patient reports monitoring blood pressures periodically when he goes to family members homes/ outpatient pharmacy; reports, "they are always fine;" reviewed general parameters for blood pressure values Encouraged ongoing physical activity/ exercise; discussed benefit of activity in setting of HTN/ HLD  Patient Goals/Self-Care Activities: As evidenced by review of EHR, collaboration with care team, and patient reporting during CCM RN CM outreach,  Patient Reggie will:  Take medications as prescribed Keep an updated list of your medications every time you have a doctor's appointment to help you stay on track with your medicine Attend all scheduled provider appointments Call pharmacy for medication refills Attend church or other social activities Call your doctor's office for new concerns or questions Continue following a heart-healthy, low salt, low cholesterol, and Coumadin-restricted diet Continue to make efforts to walk for exercise on a weekly basis Keep up the great work preventing falls     Plan: Telephone follow up appointment with care management team member scheduled for: Tuesday, May 02, 2022 at 2:15 pm The patient has been provided with contact information for the care management team and has been advised to call with any health related questions or concerns   Oneta Rack, RN, BSN, [place] [phone]: direct office

## 2022-01-19 ENCOUNTER — Other Ambulatory Visit: Payer: Self-pay | Admitting: Internal Medicine

## 2022-01-19 DIAGNOSIS — E781 Pure hyperglyceridemia: Secondary | ICD-10-CM

## 2022-01-27 ENCOUNTER — Ambulatory Visit (INDEPENDENT_AMBULATORY_CARE_PROVIDER_SITE_OTHER): Payer: Medicare Other

## 2022-01-27 DIAGNOSIS — Z7901 Long term (current) use of anticoagulants: Secondary | ICD-10-CM | POA: Diagnosis not present

## 2022-01-27 LAB — ALDOSTERONE + RENIN ACTIVITY W/ RATIO
ALDO / PRA Ratio: 4.5 Ratio (ref 0.9–28.9)
Aldosterone: 19 ng/dL
Renin Activity: 4.18 ng/mL/h (ref 0.25–5.82)

## 2022-01-27 LAB — POCT INR: INR: 2.5 (ref 2.0–3.0)

## 2022-02-03 DIAGNOSIS — I10 Essential (primary) hypertension: Secondary | ICD-10-CM | POA: Diagnosis not present

## 2022-02-03 DIAGNOSIS — E785 Hyperlipidemia, unspecified: Secondary | ICD-10-CM | POA: Diagnosis not present

## 2022-02-16 DIAGNOSIS — I10 Essential (primary) hypertension: Secondary | ICD-10-CM | POA: Diagnosis not present

## 2022-03-07 ENCOUNTER — Ambulatory Visit: Payer: Medicare Other | Admitting: *Deleted

## 2022-03-07 DIAGNOSIS — I10 Essential (primary) hypertension: Secondary | ICD-10-CM

## 2022-03-07 DIAGNOSIS — E785 Hyperlipidemia, unspecified: Secondary | ICD-10-CM

## 2022-03-07 NOTE — Chronic Care Management (AMB) (Signed)
Care Management    RN Visit Note  03/07/2022 Name: Rick Edwards MRN: [id number] DOB: 1956-06-28  Subjective: Rick Edwards is a 66 y.o. year old male who is a primary care patient of Janith Lima, MD. The care management team was consulted for assistance with disease management and care coordination needs.    Engaged with patient by telephone for follow up visit/ RN CM case closure in response to provider referral for case management and/or care coordination services.   Consent to Services:   Mr. Helminiak was given information about Care Management services 02/18/21 including:  Care Management services includes personalized support from designated clinical staff supervised by his physician, including individualized plan of care and coordination with other care providers 24/7 contact phone numbers for assistance for urgent and routine care needs. The patient may stop case management services at any time by phone call to the office staff.  Patient agreed to services and consent obtained.   Assessment: Review of patient past medical history, allergies, medications, health status, including review of consultants reports, laboratory and other test data, was performed as part of comprehensive evaluation and provision of chronic care management services.   SDOH (Social Determinants of Health) assessments and interventions performed:  SDOH Interventions    Flowsheet Row Most Recent Value  SDOH Interventions   Food Insecurity Interventions Intervention Not Indicated  [continues to deny food insecurity]  Transportation Interventions Intervention Not Indicated  [continues to drive self]     Care Plan  Allergies  Allergen Reactions   Mesalamine Rash    REACTION: Rash   Outpatient Encounter Medications as of 03/07/2022  Medication Sig Note   allopurinol (ZYLOPRIM) 300 MG tablet TAKE 1 TABLET BY MOUTH EVERY DAY    amLODipine (NORVASC) 10 MG tablet Take 1 tablet (10 mg total) by mouth  at bedtime.    atorvastatin (LIPITOR) 40 MG tablet TAKE 1 TABLET BY MOUTH EVERY DAY    carvedilol (COREG) 25 MG tablet Take 1 tablet (25 mg total) by mouth 2 (two) times daily with a meal.    hyoscyamine (LEVSIN SL) 0.125 MG SL tablet Place under the tongue 2 (two) times daily as needed. 03/17/2021: 03/17/21: Reports has not needed recently   levocetirizine (XYZAL) 5 MG tablet Take 1 tablet (5 mg total) by mouth every evening.    potassium chloride (KLOR-CON) 10 MEQ tablet TAKE 1 TABLET BY MOUTH 2 TIMES DAILY.    sildenafil (VIAGRA) 100 MG tablet Take 1 tablet (100 mg total) by mouth as needed for erectile dysfunction.    spironolactone (ALDACTONE) 25 MG tablet TAKE 1 TABLET (25 MG TOTAL) BY MOUTH DAILY.    torsemide (DEMADEX) 20 MG tablet TAKE 1 TABLET BY MOUTH EVERY DAY    VASCEPA 1 g capsule TAKE 2 CAPSULES BY MOUTH 2 TIMES DAILY.    warfarin (COUMADIN) 5 MG tablet TAKE 1.5 TABLETS DAILY, EXCEPT TAKE 1 TABLET ON MON, WED, AND FRI AS DIRECTED BY CLINIC.    [DISCONTINUED] fenofibrate (TRICOR) 145 MG tablet Take 1 tablet by mouth once daily    [DISCONTINUED] indapamide (LOZOL) 1.25 MG tablet Take 1 tablet (1.25 mg total) by mouth daily. 07/07/2019: palpitations   No facility-administered encounter medications on file as of 03/07/2022.   Patient Active Problem List   Diagnosis Date Noted   Benign prostatic hyperplasia without lower urinary tract symptoms 03/02/2021   Intrinsic eczema 50/53/9767   LSC (lichen simplex chronicus) 01/18/2021   Tinea corporis 01/18/2021   Hearing loss  due to cerumen impaction, bilateral 06/01/2020   Chronic gout due to renal impairment of multiple sites without tophus 06/01/2020   Hypertensive urgency, malignant 08/12/2019   Type II diabetes mellitus with manifestations ([place]) 08/17/2018   Atherosclerosis of aorta ([place]) 09/26/2017   Drug-induced erectile dysfunction 07/19/2016   Pure hypertriglyceridemia 04/05/2016   Hyperlipidemia with target LDL less than 70  01/18/2016   Proteinuria 03/13/2014   OA (osteoarthritis) of knee 11/25/2013   PVD (peripheral vascular disease) ([place]) 09/24/2013   Encounter for therapeutic drug monitoring 09/16/2013   Kidney disease, chronic, stage III (GFR 30-59 ml/min) ([place]) 05/26/2013   Antithrombin III deficiency ([place]) 05/16/2013   Long term (current) use of anticoagulants 11/27/2012   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 ([place]) 04/10/2012   Splenic infarction 03/20/2012   CROHN'S [place] 03/19/2009   Essential hypertension, malignant 03/02/2009   Conditions to be addressed/monitored: HTN and HLD  Care Plan : RN Care Manager Plan of Care  Updates made by Knox Royalty, RN since 03/07/2022 12:00 AM     Problem: Chronic Disease Management Needs   Priority: Medium     Long-Range Goal: Ongoing adherence to established plan of care for long term chronic disease management   Start Date: 01/12/2022  Expected End Date: 01/13/2023  Recent Progress: On track  Priority: Medium  Note:   Current Barriers:  Chronic Disease Management support and education needs related to HTN and HLD Limited health literacy-- requires ongoing reinforcement/ extra time in conversation Hard of Hearing  RNCM Clinical Goal(s):  Patient will demonstrate ongoing health management independence for HTN/ HLD  through collaboration with RN Care manager, provider, and care team  Interventions: 1:1 collaboration with primary care provider regarding development and update of comprehensive plan of care as evidenced by provider attestation and co-signature Inter-disciplinary care team collaboration (see longitudinal plan of care) Evaluation of current treatment plan related to  self management and patient's adherence to plan as established by provider Review of patient status, including review of consultants reports, relevant laboratory and other test results, and medications completed SDOH updated: no new/ unmet  concerns identified Pain assessment updated: denies pain today Falls assessment updated: continues to deny new/ recent falls x 12 months- not currently using assistive devices;  positive reinforcement provided with encouragement to continue efforts at fall prevention; previously provided education around fall risks/ prevention reinforced Medications discussed: reports continues to independently self-manage and denies current concerns/ issues/ questions around medications; endorses adherence to taking all medications as prescribed Reviewed upcoming scheduled provider appointments: 01/27/22: coumadin clinic; patient confirms is aware of and has plans to attend as scheduled; again reminded patient that he is due for next follow up PCP office visit in September: suggested he schedule appointment to coincide with annual flu vaccine-- he states he will do/ consider: reminded patient of importance of annual flu vaccine and that that time is approaching Discussed plans with patient for ongoing care management follow up- patient denies current care coordination/ care management needs and is agreeable to CCM RN CM case closure today; verbalizes understanding to contact PCP or other care providers for any needs that arise in the future, and confirms he has contact information for all care providers     Hyperlipidemia:  (Status: 03/07/22: Goal Met.) Long Term Goal Lab Results  Component Value Date   CHOL 156 01/11/2022   HDL 37.70 (L) 01/11/2022   LDLCALC 98 01/11/2022   LDLDIRECT 97.0 05/05/2019   TRIG 101.0 01/11/2022  CHOLHDL 4 01/11/2022  Counseled on importance of regular laboratory monitoring as prescribed; Reviewed importance of limiting foods high in cholesterol; Reviewed exercise goals and target of 150 minutes per week; Confirmed patient continues taking medications for HLD as prescribed and trying to follow heart healthy, low cholesterol/ salt diet- states "I try to eat as healthy as I can" Patient  confirms he received previously mailed printed information on foods with cholesterol- encouraged his ongoing review over time so he does not forget  Hypertension: (Status: 03/07/22: Goal Met.) Long Term Goal Last practice recorded BP readings:  BP Readings from Last 3 Encounters:  01/11/22 (!) 160/96  07/27/21 134/77  07/11/21 120/80  Most recent eGFR/CrCl: No results found for: EGFR  No components found for: CRCL  Evaluation of current treatment plan related to hypertension self management and patient's adherence to plan as established by provider;   Reviewed prescribed diet heart healthy, low salt, low cholesterol Counseled on the importance of exercise goals with target of 150 minutes per week Confirmed patient has continued trying to stay active and occasionally walking for exercise- positive reinforcement provided Confirmed patient continues attending regular coumadin clinic appointments Patient again reports monitoring blood pressures periodically when he goes to family members homes/ outpatient pharmacy; again reports, "they are always fine;" reviewed general parameters for blood pressure values-- today, he provides most recent blood pressure from home/ community monitoring as "121/78" Encouraged ongoing physical activity/ exercise without over-doing, especially on hot days; discussed benefit of activity in setting of HTN/ HLD     Plan:  No further follow up required: patient denies current care coordination/ care management needs and is agreeable to RN CM case closure today; case closure accordingly     Oneta Rack, RN, BSN, [place] [phone]: direct office

## 2022-03-10 ENCOUNTER — Other Ambulatory Visit: Payer: Self-pay | Admitting: Internal Medicine

## 2022-03-10 ENCOUNTER — Ambulatory Visit (INDEPENDENT_AMBULATORY_CARE_PROVIDER_SITE_OTHER): Payer: Medicare Other

## 2022-03-10 DIAGNOSIS — Z7901 Long term (current) use of anticoagulants: Secondary | ICD-10-CM

## 2022-03-10 DIAGNOSIS — I10 Essential (primary) hypertension: Secondary | ICD-10-CM

## 2022-03-10 LAB — POCT INR: INR: 2.1 (ref 2.0–3.0)

## 2022-03-10 NOTE — Progress Notes (Signed)
Continue 1 tablet daily except take 1 1/2 tablets on Mondays, Wednesdays, and Fridays. Recheck in 6 weeks.   

## 2022-03-10 NOTE — Patient Instructions (Addendum)
Pre visit review using our clinic review tool, if applicable. No additional management support is needed unless otherwise documented below in the visit note.  Continue 1 tablet daily except take 1 1/2 tablets on Mondays, Wednesdays, and Fridays. Recheck in 6 weeks.   

## 2022-03-27 ENCOUNTER — Other Ambulatory Visit: Payer: Self-pay | Admitting: Internal Medicine

## 2022-03-27 DIAGNOSIS — I10 Essential (primary) hypertension: Secondary | ICD-10-CM

## 2022-03-27 DIAGNOSIS — E876 Hypokalemia: Secondary | ICD-10-CM

## 2022-04-05 ENCOUNTER — Other Ambulatory Visit: Payer: Self-pay | Admitting: Internal Medicine

## 2022-04-05 DIAGNOSIS — E876 Hypokalemia: Secondary | ICD-10-CM

## 2022-04-05 DIAGNOSIS — I10 Essential (primary) hypertension: Secondary | ICD-10-CM

## 2022-04-05 DIAGNOSIS — E785 Hyperlipidemia, unspecified: Secondary | ICD-10-CM

## 2022-04-06 ENCOUNTER — Other Ambulatory Visit: Payer: Self-pay | Admitting: Internal Medicine

## 2022-04-06 DIAGNOSIS — M1A39X0 Chronic gout due to renal impairment, multiple sites, without tophus (tophi): Secondary | ICD-10-CM

## 2022-04-21 ENCOUNTER — Ambulatory Visit (INDEPENDENT_AMBULATORY_CARE_PROVIDER_SITE_OTHER): Payer: Medicare Other

## 2022-04-21 DIAGNOSIS — Z7901 Long term (current) use of anticoagulants: Secondary | ICD-10-CM

## 2022-04-21 DIAGNOSIS — Z23 Encounter for immunization: Secondary | ICD-10-CM

## 2022-04-21 LAB — POCT INR: INR: 2 (ref 2.0–3.0)

## 2022-04-21 NOTE — Progress Notes (Signed)
Continue 1 tablet daily except take 1 1/2 tablets on Mondays, Wednesdays, and Fridays. Recheck in 6 weeks.   Pt requested flu vaccine. Fluarix administered in left deltoid, pt tolerated well.

## 2022-04-21 NOTE — Patient Instructions (Addendum)
Pre visit review using our clinic review tool, if applicable. No additional management support is needed unless otherwise documented below in the visit note.  Continue 1 tablet daily except take 1 1/2 tablets on Mondays, Wednesdays, and Fridays. Recheck in 6 weeks.   

## 2022-05-01 ENCOUNTER — Telehealth: Payer: Self-pay | Admitting: Internal Medicine

## 2022-05-01 NOTE — Telephone Encounter (Signed)
Left message for patient to call back and schedule Medicare Annual Wellness Visit (AWV).   Please offer to do virtually or by telephone.  Left office number and my jabber [phone].  Last AWV:09/24/2020  Please schedule at anytime with Nurse Health Advisor.

## 2022-05-02 ENCOUNTER — Telehealth: Payer: Medicare Other

## 2022-05-08 ENCOUNTER — Ambulatory Visit (INDEPENDENT_AMBULATORY_CARE_PROVIDER_SITE_OTHER): Payer: Medicare Other

## 2022-05-08 VITALS — Ht 69.0 in | Wt 228.0 lb

## 2022-05-08 DIAGNOSIS — Z0000 Encounter for general adult medical examination without abnormal findings: Secondary | ICD-10-CM | POA: Diagnosis not present

## 2022-05-08 NOTE — Progress Notes (Signed)
Subjective:   Rick Edwards is a 66 y.o. male who presents for Medicare Annual/Subsequent preventive examination.   Virtual Visit via Telephone Note  I connected with  Rick Edwards on 05/08/22 at  1:00 PM EDT by telephone and verified that I am speaking with the correct person using two identifiers.  Location: Patient: home  Provider: Esmond Plants  Persons participating in the virtual visit: [place]   I discussed the limitations, risks, security and privacy concerns of performing an evaluation and management service by telephone and the availability of in person appointments. The patient expressed understanding and agreed to proceed.  Interactive audio and video telecommunications were attempted between this nurse and patient, however failed, due to patient having technical difficulties OR patient did not have access to video capability.  We continued and completed visit with audio only.  Some vital signs may be absent or patient reported.   Daphane Shepherd, LPN  Review of Systems     Cardiac Risk Factors include: advanced age (>80mn, >>68women);diabetes mellitus;dyslipidemia;hypertension;male gender     Objective:    Today's Vitals   05/08/22 1313  Weight: 228 lb (103.4 kg)  Height: '5\' 9"'$  (1.753 m)   Body mass index is 33.67 kg/m.     05/08/2022    1:17 PM 09/24/2020    1:40 PM 08/12/2019    1:34 PM 01/19/2016    7:42 AM 03/31/2015    1:32 PM 05/27/2013    4:20 PM 05/18/2013    8:00 AM  Advanced Directives  Does Patient Have a Medical Advance Directive? No Yes No No No Patient does not have advance directive Patient does not have advance directive;Patient has advance directive, copy not in chart  Type of Advance Directive  Living will;Healthcare Power of Attorney       Does patient want to make changes to medical advance directive?  No - Patient declined       Copy of [facility]alf Moon[facility] Chart?  No - copy requested       Would  patient like information on creating a medical advance directive? No - Patient declined  No - Patient declined No - patient declined information Yes - Educational materials given    Pre-existing out of facility DNR order (yellow form or pink MOST form)       Other (comment)    Current Medications (verified) Outpatient Encounter Medications as of 05/08/2022  Medication Sig   allopurinol (ZYLOPRIM) 300 MG tablet TAKE 1 TABLET BY MOUTH EVERY DAY   amLODipine (NORVASC) 10 MG tablet Take 1 tablet (10 mg total) by mouth at bedtime.   atorvastatin (LIPITOR) 40 MG tablet TAKE 1 TABLET BY MOUTH EVERY DAY   carvedilol (COREG) 25 MG tablet Take 1 tablet (25 mg total) by mouth 2 (two) times daily with a meal.   hyoscyamine (LEVSIN SL) 0.125 MG SL tablet Place under the tongue 2 (two) times daily as needed.   levocetirizine (XYZAL) 5 MG tablet Take 1 tablet (5 mg total) by mouth every evening.   potassium chloride (KLOR-CON) 10 MEQ tablet TAKE 1 TABLET BY MOUTH TWICE A DAY   sildenafil (VIAGRA) 100 MG tablet Take 1 tablet (100 mg total) by mouth as needed for erectile dysfunction.   spironolactone (ALDACTONE) 25 MG tablet TAKE 1 TABLET (25 MG TOTAL) BY MOUTH DAILY.   torsemide (DEMADEX) 20 MG tablet TAKE 1 TABLET BY MOUTH EVERY DAY   VASCEPA 1 g capsule TAKE 2  CAPSULES BY MOUTH 2 TIMES DAILY.   warfarin (COUMADIN) 5 MG tablet TAKE 1.5 TABLETS DAILY, EXCEPT TAKE 1 TABLET ON MON, WED, AND FRI AS DIRECTED BY CLINIC.   [DISCONTINUED] fenofibrate (TRICOR) 145 MG tablet Take 1 tablet by mouth once daily   [DISCONTINUED] indapamide (LOZOL) 1.25 MG tablet Take 1 tablet (1.25 mg total) by mouth daily.   No facility-administered encounter medications on file as of 05/08/2022.    Allergies (verified) Mesalamine   History: Past Medical History:  Diagnosis Date   Anemia    anemia thrombocytopenia, saw Hematology 2011, can not r/o myeloproliferative d/c   Antithrombin III deficiency ([place]) 05/16/2013   On  coumadin for this   Blood clot in vein    in right leg   Chronic kidney disease 05/27/2013   ACUTE RENAL FAILURE    Crohn's disease ([place])    tx. Imuran   GI bleed 6/11   w/ normal EGD 6/11, 3 PRBCs    Hearing loss    wears hearing aid left ear; birthed with hearing loss   HOH (hard of hearing) 05/16/2013   Hypercalcemia    Hyperkalemia 05/27/2013   Hypertension    Ileostomy in place [facility]) 04-11-13   04-18-13 ileostomy to be taken down.   Perianal abscess 2001   s/p right hemicolectomy, and drainage of retroperitoneal abcess 2003   Peripheral vascular disease ([place])    Transfusion history    last 8'13   Past Surgical History:  Procedure Laterality Date   Anal fistulotomy  2002   EMBOLECTOMY  03/12/2012   Procedure: EMBOLECTOMY;  Surgeon: Angelia Mould, MD;  Location: [place];  Service: Vascular;  Laterality: Right;  Right popliteal embolectomy with vein patch angioplasty, right posterior tibial embolectomy with vein patch angioplasty    HEMICOLECTOMY  08/29/2001   perforated abscess   ILEOSTOMY CLOSURE N/A 04/18/2013   Procedure: ILEOSTOMY TAKEDOWN;  Surgeon: Edward Jolly, MD;  Location: [facility];  Service: General;  Laterality: N/A;   INTRAOPERATIVE ARTERIOGRAM  03/12/2012   Procedure: INTRA OPERATIVE ARTERIOGRAM;  Surgeon: Angelia Mould, MD;  Location: [place];  Service: Vascular;  Laterality: Right;   PARTIAL COLECTOMY  03/11/2012   Procedure: PARTIAL COLECTOMY;  Surgeon: Edward Jolly, MD;  Location: [facility];  Service: General;  Laterality: N/A;  subtotal colectomy transverse and left colon    TRANSMETATARSAL AMPUTATION  05/10/2012   right   Family History  Problem Relation Age of Onset   Hypertension Mother    Hyperlipidemia Mother    Hypertension Father    Hyperlipidemia Father    Heart disease Father    Stroke Other        GF, aunts    Hypertension Other        "the whole family"   Coronary artery disease Neg Hx    Diabetes Neg Hx    Colon cancer  Neg Hx    Prostate cancer Neg Hx    Kidney failure Neg Hx    Social History   Socioeconomic History   Marital status: Single    Spouse name: Not on file   Number of children: Not on file   Years of education: Not on file   Highest education level: Not on file  Occupational History   Occupation: [employer]   Tobacco Use   Smoking status: Former    Packs/day: 0.25    Years: 9.00    Total pack years: 2.25    Types: Cigarettes  Quit date: 05/09/2011    Years since quitting: 11.0   Smokeless tobacco: Never  Substance and Sexual Activity   Alcohol use: No    Alcohol/week: 0.0 standard drinks of alcohol   Drug use: Yes    Types: Marijuana   Sexual activity: Not Currently  Other Topics Concern   Not on file  Social History Narrative   Lives by himself   Regular exercise- no          Social Determinants of Health   Financial Resource Strain: Low Risk  (05/08/2022)   Overall Financial Resource Strain (CARDIA)    Difficulty of Paying Living Expenses: Not hard at all  Food Insecurity: No Food Insecurity (05/08/2022)   Hunger Vital Sign    Worried About Running Out of Food in the Last Year: Never true    Ran Out of Food in the Last Year: Never true  Transportation Needs: No Transportation Needs (05/08/2022)   PRAPARE - Hydrologist (Medical): No    Lack of Transportation (Non-Medical): No  Physical Activity: Inactive (05/08/2022)   Exercise Vital Sign    Days of Exercise per Week: 0 days    Minutes of Exercise per Session: 0 min  Stress: No Stress Concern Present (05/08/2022)   Hooks    Feeling of Stress : Not at all  Social Connections: Moderately Isolated (05/08/2022)   Social Connection and Isolation Panel [NHANES]    Frequency of Communication with Friends and Family: More than three times a week    Frequency of Social Gatherings with Friends and Family: More than three  times a week    Attends Religious Services: More than 4 times per year    Active Member of [employer] or Organizations: No    Attends Music therapist: Never    Marital Status: Never married    Tobacco Counseling Counseling given: Not Answered   Clinical Intake:  Pre-visit preparation completed: Yes  Pain : No/denies pain     Nutritional Risks: None Diabetes: No  How often do you need to have someone help you when you read instructions, pamphlets, or other written materials from your doctor or pharmacy?: 1 - Never  Diabetic?yes Nutrition Risk Assessment:  Has the patient had any N/V/D within the last 2 months?  No  Does the patient have any non-healing wounds?  No  Has the patient had any unintentional weight loss or weight gain?  No   Diabetes:  Is the patient diabetic?  Yes  If diabetic, was a CBG obtained today?  No  Did the patient bring in their glucometer from home?  No  How often do you monitor your CBG's? Daily .   Financial Strains and Diabetes Management:  Are you having any financial strains with the device, your supplies or your medication? No .  Does the patient want to be seen by Chronic Care Management for management of their diabetes?  No  Would the patient like to be referred to a Nutritionist or for Diabetic Management?  No   Diabetic Exams:  Diabetic Eye Exam: Overdue for diabetic eye exam. Pt has been advised about the importance in completing this exam. Patient advised to call and schedule an eye exam. Diabetic Foot Exam: Overdue, Pt has been advised about the importance in completing this exam. Pt is scheduled for diabetic foot exam on next office visit .   Interpreter Needed?: No  Information entered by ::  Jadene Pierini, LPN   Activities of Daily Living    05/08/2022    1:17 PM  In your present state of health, do you have any difficulty performing the following activities:  Hearing? 0  Vision? 0  Difficulty concentrating or  making decisions? 0  Walking or climbing stairs? 0  Dressing or bathing? 0  Doing errands, shopping? 0  Preparing Food and eating ? N  Using the Toilet? N  In the past six months, have you accidently leaked urine? N  Do you have problems with loss of bowel control? N  Managing your Medications? N  Managing your Finances? N  Housekeeping or managing your Housekeeping? N    Patient Care Team: Janith Lima, MD as PCP - General (Internal Medicine) Excell Seltzer, MD (Inactive) (General Surgery) Michael Boston, MD as Consulting Physician (General Surgery) Jansen, [facility] (Ophthalmology)  Indicate any recent Medical Services you may have received from other than [facility] providers in the past year (date may be approximate).     Assessment:   This is a routine wellness examination for [place].  Hearing/Vision screen Vision Screening - Comments:: Due eye exam   Dietary issues and exercise activities discussed: Current Exercise Habits: The patient does not participate in regular exercise at present, Exercise limited by: orthopedic condition(s)   Goals Addressed             This Visit's Progress    Patient Stated   On track    To maintain my current health status by continuing to eat healthy, stay physically active and socially active.       Depression Screen    05/08/2022    1:15 PM 01/12/2022    2:15 PM 03/17/2021    2:00 PM 01/18/2021   10:45 AM 09/24/2020    1:37 PM 05/05/2019    3:56 PM 10/29/2017    3:30 PM  PHQ 2/9 Scores  PHQ - 2 Score 0 0 0 0 0 0 0    Fall Risk    05/08/2022    1:14 PM 03/07/2022    1:25 PM 01/12/2022    2:15 PM 10/18/2021    2:15 PM 07/19/2021    1:45 PM  [place] in the past year? 0 0 0 0 0  Comment  continues to deny falls x last 12 months- does not use assistive devices continues to deny falls x 12 months; does not use assistive devices denies new/ recent falls x 12 months; does not use assistive devices   Number falls in  past yr: 0 0 0 0 0  Injury with Fall? 0 0 0 0 0  Comment  N/A- no falls reported N/A- no falls reported  N/A- no falls reported  Risk for fall due to : No Fall Risks No Fall Risks No Fall Risks No Fall Risks No Fall Risks  Follow up Falls prevention discussed Falls prevention discussed Falls prevention discussed Falls prevention discussed Falls prevention discussed    FALL RISK PREVENTION PERTAINING TO THE HOME:  Any stairs in or around the home? No  If so, are there any without handrails? No  Home free of loose throw rugs in walkways, pet beds, electrical cords, etc? Yes  Adequate lighting in your home to reduce risk of falls? Yes   ASSISTIVE DEVICES UTILIZED TO PREVENT FALLS:  Life alert? No  Use of a cane, walker or w/c? No  Grab bars in the bathroom? No  Shower chair or bench in  shower? No  Elevated toilet seat or a handicapped toilet? No        05/08/2022    1:18 PM  6CIT Screen  What Year? 0 points  What month? 0 points  What time? 0 points  Count back from 20 0 points  Months in reverse 0 points  Repeat phrase 0 points  Total Score 0 points    Immunizations Immunization History  Administered Date(s) Administered   Influenza Split 05/29/2011, 07/17/2012   Influenza Whole 06/29/2009   Influenza,inj,Quad PF,6+ Mos 05/19/2013, 06/03/2014, 05/18/2015, 04/05/2016, 05/23/2017, 04/22/2018, 05/05/2019, 06/01/2020, 05/24/2021, 04/21/2022   PFIZER(Purple Top)SARS-COV-2 Vaccination 10/10/2019, 11/20/2019   Pneumococcal Conjugate-13 01/18/2016   Pneumococcal Polysaccharide-23 05/19/2013, 08/15/2018   Td 06/29/2009   Tdap 08/15/2018   Zoster Recombinat (Shingrix) 04/25/2021    TDAP status: Up to date  Flu Vaccine status: Up to date  Pneumococcal vaccine status: Up to date  Covid-19 vaccine status: Completed vaccines  Qualifies for Shingles Vaccine? Yes   Zostavax completed Yes   Shingrix Completed?: Yes  Screening Tests Health Maintenance  Topic Date Due    OPHTHALMOLOGY EXAM  Never done   COVID-19 Vaccine (3 - Pfizer series) 01/15/2020   FOOT EXAM  06/01/2021   Zoster Vaccines- Shingrix (2 of 2) 06/20/2021   HEMOGLOBIN A1C  01/09/2022   Diabetic kidney evaluation - Urine ACR  07/11/2022   Diabetic kidney evaluation - GFR measurement  01/12/2023   COLONOSCOPY (Pts 45-59yr Insurance coverage will need to be confirmed)  02/15/2023   TETANUS/TDAP  08/15/2028   INFLUENZA VACCINE  Completed   Hepatitis C Screening  Completed   HIV Screening  Completed   HPV VACCINES  Aged Out    Health Maintenance  Health Maintenance Due  Topic Date Due   OPHTHALMOLOGY EXAM  Never done   COVID-19 Vaccine (3 - Pfizer series) 01/15/2020   FOOT EXAM  06/01/2021   Zoster Vaccines- Shingrix (2 of 2) 06/20/2021   HEMOGLOBIN A1C  01/09/2022    Colorectal cancer screening: Type of screening: Colonoscopy. Completed 02/14/2013. Repeat every 10 years  Lung Cancer Screening: (Low Dose CT Chest recommended if Age 64-80years, 30 pack-year currently smoking OR have quit w/in 15years.) does not qualify.   Lung Cancer Screening Referral: n/a  Additional Screening:  Hepatitis C Screening: does not qualify;   Vision Screening: Recommended annual ophthalmology exams for early detection of glaucoma and other disorders of the eye. Is the patient up to date with their annual eye exam?  No  Who is the provider or what is the name of the office in which the patient attends annual eye exams? None patient will call to schedule  If pt is not established with a provider, would they like to be referred to a provider to establish care? No .   Dental Screening: Recommended annual dental exams for proper oral hygiene  Community Resource Referral / Chronic Care Management: CRR required this visit?  No   CCM required this visit?  No      Plan:     I have personally reviewed and noted the following in the patient's chart:   Medical and social history Use of alcohol,  tobacco or illicit drugs  Current medications and supplements including opioid prescriptions. Patient is not currently taking opioid prescriptions. Functional ability and status Nutritional status Physical activity Advanced directives List of other physicians Hospitalizations, surgeries, and ER visits in previous 12 months Vitals Screenings to include cognitive, depression, and falls Referrals and appointments  In  addition, I have reviewed and discussed with patient certain preventive protocols, quality metrics, and best practice recommendations. A written personalized care plan for preventive services as well as general preventive health recommendations were provided to patient.     Daphane Shepherd, LPN   46/03/320   Nurse Notes: Patient to schedule diabetic eye exam

## 2022-05-08 NOTE — Patient Instructions (Signed)
[name] , Thank you for taking time to come for your Medicare Wellness Visit. I appreciate your ongoing commitment to your health goals. Please review the following plan we discussed and let me know if I can assist you in the future.   These are the goals we discussed:  Goals      Patient Stated     To maintain my current health status by continuing to eat healthy, stay physically active and socially active.        This is a list of the screening recommended for you and due dates:  Health Maintenance  Topic Date Due   Eye exam for diabetics  Never done   COVID-19 Vaccine (3 - Pfizer series) 01/15/2020   Complete foot exam   06/01/2021   Zoster (Shingles) Vaccine (2 of 2) 06/20/2021   Hemoglobin A1C  01/09/2022   Yearly kidney health urinalysis for diabetes  07/11/2022   Yearly kidney function blood test for diabetes  01/12/2023   Colon Cancer Screening  02/15/2023   Tetanus Vaccine  08/15/2028   Flu Shot  Completed   Hepatitis C Screening: USPSTF Recommendation to screen - Ages 18-79 yo.  Completed   HIV Screening  Completed   HPV Vaccine  Aged Out    Advanced directives: Advance directive discussed with you today. I have provided a copy for you to complete at home and have notarized. Once this is complete please bring a copy in to our office so we can scan it into your chart.   Conditions/risks identified: Aim for 30 minutes of exercise or brisk walking, 6-8 glasses of water, and 5 servings of fruits and vegetables each day.   Next appointment: Follow up in one year for your annual wellness visit   Preventive Care 40-64 Years, Male Preventive care refers to lifestyle choices and visits with your health care provider that can promote health and wellness. What does preventive care include? A yearly physical exam. This is also called an annual well check. Dental exams once or twice a year. Routine eye exams. Ask your health care provider how often you should have your eyes  checked. Personal lifestyle choices, including: Daily care of your teeth and gums. Regular physical activity. Eating a healthy diet. Avoiding tobacco and drug use. Limiting alcohol use. Practicing safe sex. Taking low-dose aspirin every day starting at age 50. What happens during an annual well check? The services and screenings done by your health care provider during your annual well check will depend on your age, overall health, lifestyle risk factors, and family history of disease. Counseling  Your health care provider may ask you questions about your: Alcohol use. Tobacco use. Drug use. Emotional well-being. Home and relationship well-being. Sexual activity. Eating habits. Work and work Statistician. Screening  You may have the following tests or measurements: Height, weight, and BMI. Blood pressure. Lipid and cholesterol levels. These may be checked every 5 years, or more frequently if you are over 45 years old. Skin check. Lung cancer screening. You may have this screening every year starting at age 30 if you have a 30-pack-year history of smoking and currently smoke or have quit within the past 15 years. Fecal occult blood test (FOBT) of the stool. You may have this test every year starting at age 33. Flexible sigmoidoscopy or colonoscopy. You may have a sigmoidoscopy every 5 years or a colonoscopy every 10 years starting at age 58. Prostate cancer screening. Recommendations will vary depending on your family history  and other risks. Hepatitis C blood test. Hepatitis B blood test. Sexually transmitted disease (STD) testing. Diabetes screening. This is done by checking your blood sugar (glucose) after you have not eaten for a while (fasting). You may have this done every 1-3 years. Discuss your test results, treatment options, and if necessary, the need for more tests with your health care provider. Vaccines  Your health care provider may recommend certain vaccines, such  as: Influenza vaccine. This is recommended every year. Tetanus, diphtheria, and acellular pertussis (Tdap, Td) vaccine. You may need a Td booster every 10 years. Zoster vaccine. You may need this after age 34. Pneumococcal 13-valent conjugate (PCV13) vaccine. You may need this if you have certain conditions and have not been vaccinated. Pneumococcal polysaccharide (PPSV23) vaccine. You may need one or two doses if you smoke cigarettes or if you have certain conditions. Talk to your health care provider about which screenings and vaccines you need and how often you need them. This information is not intended to replace advice given to you by your health care provider. Make sure you discuss any questions you have with your health care provider. Document Released: 08/20/2015 Document Revised: 04/12/2016 Document Reviewed: 05/25/2015 Elsevier Interactive Patient Education  2017 [employer] Prevention in the Home Falls can cause injuries. They can happen to people of all ages. There are many things you can do to make your home safe and to help prevent falls. What can I do on the outside of my home? Regularly fix the edges of walkways and driveways and fix any cracks. Remove anything that might make you trip as you walk through a door, such as a raised step or threshold. Trim any bushes or trees on the path to your home. Use bright outdoor lighting. Clear any walking paths of anything that might make someone trip, such as rocks or tools. Regularly check to see if handrails are loose or broken. Make sure that both sides of any steps have handrails. Any raised decks and porches should have guardrails on the edges. Have any leaves, snow, or ice cleared regularly. Use sand or salt on walking paths during winter. Clean up any spills in your garage right away. This includes oil or grease spills. What can I do in the bathroom? Use night lights. Install grab bars by the toilet and in the tub and  shower. Do not use towel bars as grab bars. Use non-skid mats or decals in the tub or shower. If you need to sit down in the shower, use a plastic, non-slip stool. Keep the floor dry. Clean up any water that spills on the floor as soon as it happens. Remove soap buildup in the tub or shower regularly. Attach bath mats securely with double-sided non-slip rug tape. Do not have throw rugs and other things on the floor that can make you trip. What can I do in the bedroom? Use night lights. Make sure that you have a light by your bed that is easy to reach. Do not use any sheets or blankets that are too big for your bed. They should not hang down onto the floor. Have a firm chair that has side arms. You can use this for support while you get dressed. Do not have throw rugs and other things on the floor that can make you trip. What can I do in the kitchen? Clean up any spills right away. Avoid walking on wet floors. Keep items that you use a lot in easy-to-reach  places. If you need to reach something above you, use a strong step stool that has a grab bar. Keep electrical cords out of the way. Do not use floor polish or wax that makes floors slippery. If you must use wax, use non-skid floor wax. Do not have throw rugs and other things on the floor that can make you trip. What can I do with my stairs? Do not leave any items on the stairs. Make sure that there are handrails on both sides of the stairs and use them. Fix handrails that are broken or loose. Make sure that handrails are as long as the stairways. Check any carpeting to make sure that it is firmly attached to the stairs. Fix any carpet that is loose or worn. Avoid having throw rugs at the top or bottom of the stairs. If you do have throw rugs, attach them to the floor with carpet tape. Make sure that you have a light switch at the top of the stairs and the bottom of the stairs. If you do not have them, ask someone to add them for  you. What else can I do to help prevent falls? Wear shoes that: Do not have high heels. Have rubber bottoms. Are comfortable and fit you well. Are closed at the toe. Do not wear sandals. If you use a stepladder: Make sure that it is fully opened. Do not climb a closed stepladder. Make sure that both sides of the stepladder are locked into place. Ask someone to hold it for you, if possible. Clearly mark and make sure that you can see: Any grab bars or handrails. First and last steps. Where the edge of each step is. Use tools that help you move around (mobility aids) if they are needed. These include: Canes. Walkers. Scooters. Crutches. Turn on the lights when you go into a dark area. Replace any light bulbs as soon as they burn out. Set up your furniture so you have a clear path. Avoid moving your furniture around. If any of your floors are uneven, fix them. If there are any pets around you, be aware of where they are. Review your medicines with your doctor. Some medicines can make you feel dizzy. This can increase your chance of falling. Ask your doctor what other things that you can do to help prevent falls. This information is not intended to replace advice given to you by your health care provider. Make sure you discuss any questions you have with your health care provider. Document Released: 05/20/2009 Document Revised: 12/30/2015 Document Reviewed: 08/28/2014 Elsevier Interactive Patient Education  2017 [employer].

## 2022-05-09 ENCOUNTER — Ambulatory Visit (INDEPENDENT_AMBULATORY_CARE_PROVIDER_SITE_OTHER): Payer: Medicare Other | Admitting: Internal Medicine

## 2022-05-09 ENCOUNTER — Encounter: Payer: Self-pay | Admitting: Internal Medicine

## 2022-05-09 VITALS — BP 136/84 | HR 64 | Temp 98.5°F | Ht 69.0 in | Wt 222.0 lb

## 2022-05-09 DIAGNOSIS — E118 Type 2 diabetes mellitus with unspecified complications: Secondary | ICD-10-CM | POA: Diagnosis not present

## 2022-05-09 DIAGNOSIS — R808 Other proteinuria: Secondary | ICD-10-CM | POA: Diagnosis not present

## 2022-05-09 DIAGNOSIS — I10 Essential (primary) hypertension: Secondary | ICD-10-CM

## 2022-05-09 DIAGNOSIS — E785 Hyperlipidemia, unspecified: Secondary | ICD-10-CM | POA: Diagnosis not present

## 2022-05-09 DIAGNOSIS — E781 Pure hyperglyceridemia: Secondary | ICD-10-CM

## 2022-05-09 DIAGNOSIS — E876 Hypokalemia: Secondary | ICD-10-CM | POA: Diagnosis not present

## 2022-05-09 DIAGNOSIS — M1A39X0 Chronic gout due to renal impairment, multiple sites, without tophus (tophi): Secondary | ICD-10-CM

## 2022-05-09 DIAGNOSIS — I743 Embolism and thrombosis of arteries of the lower extremities: Secondary | ICD-10-CM | POA: Diagnosis not present

## 2022-05-09 DIAGNOSIS — Z7901 Long term (current) use of anticoagulants: Secondary | ICD-10-CM

## 2022-05-09 LAB — URINALYSIS, ROUTINE W REFLEX MICROSCOPIC
Bilirubin Urine: NEGATIVE
Hgb urine dipstick: NEGATIVE
Ketones, ur: NEGATIVE
Leukocytes,Ua: NEGATIVE
Nitrite: NEGATIVE
RBC / HPF: NONE SEEN (ref 0–?)
Specific Gravity, Urine: 1.015 (ref 1.000–1.030)
Total Protein, Urine: 100 — AB
Urine Glucose: NEGATIVE
Urobilinogen, UA: 1 (ref 0.0–1.0)
pH: 6 (ref 5.0–8.0)

## 2022-05-09 LAB — CBC WITH DIFFERENTIAL/PLATELET
Basophils Absolute: 0.1 10*3/uL (ref 0.0–0.1)
Basophils Relative: 1.1 % (ref 0.0–3.0)
Eosinophils Absolute: 0.2 10*3/uL (ref 0.0–0.7)
Eosinophils Relative: 3.1 % (ref 0.0–5.0)
HCT: 43.2 % (ref 39.0–52.0)
Hemoglobin: 14.6 g/dL (ref 13.0–17.0)
Lymphocytes Relative: 24.2 % (ref 12.0–46.0)
Lymphs Abs: 1.6 10*3/uL (ref 0.7–4.0)
MCHC: 33.8 g/dL (ref 30.0–36.0)
MCV: 91.1 fl (ref 78.0–100.0)
Monocytes Absolute: 0.6 10*3/uL (ref 0.1–1.0)
Monocytes Relative: 9.3 % (ref 3.0–12.0)
Neutro Abs: 4.1 10*3/uL (ref 1.4–7.7)
Neutrophils Relative %: 62.3 % (ref 43.0–77.0)
Platelets: 350 10*3/uL (ref 150.0–400.0)
RBC: 4.74 Mil/uL (ref 4.22–5.81)
RDW: 16.1 % — ABNORMAL HIGH (ref 11.5–15.5)
WBC: 6.6 10*3/uL (ref 4.0–10.5)

## 2022-05-09 LAB — MICROALBUMIN / CREATININE URINE RATIO
Creatinine,U: 184.8 mg/dL
Microalb Creat Ratio: 16.9 mg/g (ref 0.0–30.0)
Microalb, Ur: 31.2 mg/dL — ABNORMAL HIGH (ref 0.0–1.9)

## 2022-05-09 LAB — BASIC METABOLIC PANEL
BUN: 17 mg/dL (ref 6–23)
CO2: 30 mEq/L (ref 19–32)
Calcium: 9.2 mg/dL (ref 8.4–10.5)
Chloride: 97 mEq/L (ref 96–112)
Creatinine, Ser: 1.18 mg/dL (ref 0.40–1.50)
GFR: 65.49 mL/min (ref >60.00)
Glucose, Bld: 110 mg/dL — ABNORMAL HIGH (ref 70–99)
Potassium: 4 mEq/L (ref 3.5–5.1)
Sodium: 135 mEq/L (ref 135–145)

## 2022-05-09 LAB — URIC ACID: Uric Acid, Serum: 4.9 mg/dL (ref 4.0–7.8)

## 2022-05-09 LAB — HEPATIC FUNCTION PANEL
ALT: 17 U/L (ref 0–53)
AST: 14 U/L (ref 0–37)
Albumin: 4.3 g/dL (ref 3.5–5.2)
Alkaline Phosphatase: 101 U/L (ref 39–117)
Bilirubin, Direct: 0.2 mg/dL (ref 0.0–0.3)
Total Bilirubin: 1 mg/dL (ref 0.2–1.2)
Total Protein: 7.3 g/dL (ref 6.0–8.3)

## 2022-05-09 LAB — HEMOGLOBIN A1C: Hgb A1c MFr Bld: 6.1 % (ref 4.6–6.5)

## 2022-05-09 MED ORDER — SPIRONOLACTONE 25 MG PO TABS: 25.0000 mg | ORAL_TABLET | Freq: Every day | ORAL | 1 refills | Status: DC

## 2022-05-09 MED ORDER — ATORVASTATIN CALCIUM 40 MG PO TABS: 40.0000 mg | ORAL_TABLET | Freq: Every day | ORAL | 1 refills | Status: DC

## 2022-05-09 MED ORDER — TORSEMIDE 20 MG PO TABS: 20.0000 mg | ORAL_TABLET | Freq: Every day | ORAL | 1 refills | Status: DC

## 2022-05-09 MED ORDER — CARVEDILOL 25 MG PO TABS: 25.0000 mg | ORAL_TABLET | Freq: Two times a day (BID) | ORAL | 1 refills | Status: DC

## 2022-05-09 MED ORDER — WARFARIN SODIUM 5 MG PO TABS: ORAL_TABLET | ORAL | 1 refills | Status: DC

## 2022-05-09 MED ORDER — VASCEPA 1 G PO CAPS: ORAL_CAPSULE | ORAL | 1 refills | Status: DC

## 2022-05-09 MED ORDER — AMLODIPINE BESYLATE 10 MG PO TABS: 10.0000 mg | ORAL_TABLET | Freq: Every day | ORAL | 1 refills | Status: DC

## 2022-05-09 MED ORDER — ALLOPURINOL 300 MG PO TABS: 300.0000 mg | ORAL_TABLET | Freq: Every day | ORAL | 1 refills | Status: DC

## 2022-05-09 NOTE — Patient Instructions (Signed)
Hypertension, Adult High blood pressure (hypertension) is when the force of blood pumping through the arteries is too strong. The arteries are the blood vessels that carry blood from the heart throughout the body. Hypertension forces the heart to work harder to pump blood and may cause arteries to become narrow or stiff. Untreated or uncontrolled hypertension can lead to a heart attack, heart failure, a stroke, kidney disease, and other problems. A blood pressure reading consists of a higher number over a lower number. Ideally, your blood pressure should be below 120/80. The first ("top") number is called the systolic pressure. It is a measure of the pressure in your arteries as your heart beats. The second ("bottom") number is called the diastolic pressure. It is a measure of the pressure in your arteries as the heart relaxes. What are the causes? The exact cause of this condition is not known. There are some conditions that result in high blood pressure. What increases the risk? Certain factors may make you more likely to develop high blood pressure. Some of these risk factors are under your control, including: Smoking. Not getting enough exercise or physical activity. Being overweight. Having too much fat, sugar, calories, or salt (sodium) in your diet. Drinking too much alcohol. Other risk factors include: Having a personal history of heart disease, diabetes, high cholesterol, or kidney disease. Stress. Having a family history of high blood pressure and high cholesterol. Having obstructive sleep apnea. Age. The risk increases with age. What are the signs or symptoms? High blood pressure may not cause symptoms. Very high blood pressure (hypertensive crisis) may cause: Headache. Fast or irregular heartbeats (palpitations). Shortness of breath. Nosebleed. Nausea and vomiting. Vision changes. Severe chest pain, dizziness, and seizures. How is this diagnosed? This condition is diagnosed by  measuring your blood pressure while you are seated, with your arm resting on a flat surface, your legs uncrossed, and your feet flat on the floor. The cuff of the blood pressure monitor will be placed directly against the skin of your upper arm at the level of your heart. Blood pressure should be measured at least twice using the same arm. Certain conditions can cause a difference in blood pressure between your right and left arms. If you have a high blood pressure reading during one visit or you have normal blood pressure with other risk factors, you may be asked to: Return on a different day to have your blood pressure checked again. Monitor your blood pressure at home for 1 week or longer. If you are diagnosed with hypertension, you may have other blood or imaging tests to help your health care provider understand your overall risk for other conditions. How is this treated? This condition is treated by making healthy lifestyle changes, such as eating healthy foods, exercising more, and reducing your alcohol intake. You may be referred for counseling on a healthy diet and physical activity. Your health care provider may prescribe medicine if lifestyle changes are not enough to get your blood pressure under control and if: Your systolic blood pressure is above 130. Your diastolic blood pressure is above 80. Your personal target blood pressure may vary depending on your medical conditions, your age, and other factors. Follow these instructions at home: Eating and drinking  Eat a diet that is high in fiber and potassium, and low in sodium, added sugar, and fat. An example of this eating plan is called the DASH diet. DASH stands for Dietary Approaches to Stop Hypertension. To eat this way: Eat   plenty of fresh fruits and vegetables. Try to fill one half of your plate at each meal with fruits and vegetables. Eat whole grains, such as whole-wheat pasta, brown rice, or whole-grain bread. Fill about one  fourth of your plate with whole grains. Eat or drink low-fat dairy products, such as skim milk or low-fat yogurt. Avoid fatty cuts of meat, processed or cured meats, and poultry with skin. Fill about one fourth of your plate with lean proteins, such as fish, chicken without skin, beans, eggs, or tofu. Avoid pre-made and processed foods. These tend to be higher in sodium, added sugar, and fat. Reduce your daily sodium intake. Many people with hypertension should eat less than 1,500 mg of sodium a day. Do not drink alcohol if: Your health care provider tells you not to drink. You are pregnant, may be pregnant, or are planning to become pregnant. If you drink alcohol: Limit how much you have to: 0-1 drink a day for women. 0-2 drinks a day for men. Know how much alcohol is in your drink. In the [place] one drink equals one 12 oz bottle of beer (355 mL), one 5 oz glass of wine (148 mL), or one 1 oz glass of hard liquor (44 mL). Lifestyle  Work with your health care provider to maintain a healthy body weight or to lose weight. Ask what an ideal weight is for you. Get at least 30 minutes of exercise that causes your heart to beat faster (aerobic exercise) most days of the week. Activities may include walking, swimming, or biking. Include exercise to strengthen your muscles (resistance exercise), such as Pilates or lifting weights, as part of your weekly exercise routine. Try to do these types of exercises for 30 minutes at least 3 days a week. Do not use any products that contain nicotine or tobacco. These products include cigarettes, chewing tobacco, and vaping devices, such as e-cigarettes. If you need help quitting, ask your health care provider. Monitor your blood pressure at home as told by your health care provider. Keep all follow-up visits. This is important. Medicines Take over-the-counter and prescription medicines only as told by your health care provider. Follow directions carefully. Blood  pressure medicines must be taken as prescribed. Do not skip doses of blood pressure medicine. Doing this puts you at risk for problems and can make the medicine less effective. Ask your health care provider about side effects or reactions to medicines that you should watch for. Contact a health care provider if you: Think you are having a reaction to a medicine you are taking. Have headaches that keep coming back (recurring). Feel dizzy. Have swelling in your ankles. Have trouble with your vision. Get help right away if you: Develop a severe headache or confusion. Have unusual weakness or numbness. Feel faint. Have severe pain in your chest or abdomen. Vomit repeatedly. Have trouble breathing. These symptoms may be an emergency. Get help right away. Call 911. Do not wait to see if the symptoms will go away. Do not drive yourself to the hospital. Summary Hypertension is when the force of blood pumping through your arteries is too strong. If this condition is not controlled, it may put you at risk for serious complications. Your personal target blood pressure may vary depending on your medical conditions, your age, and other factors. For most people, a normal blood pressure is less than 120/80. Hypertension is treated with lifestyle changes, medicines, or a combination of both. Lifestyle changes include losing weight, eating a healthy,   low-sodium diet, exercising more, and limiting alcohol. This information is not intended to replace advice given to you by your health care provider. Make sure you discuss any questions you have with your health care provider. Document Revised: 05/31/2021 Document Reviewed: 05/31/2021 Elsevier Patient Education  2023 [employer] Inc.  

## 2022-05-09 NOTE — Progress Notes (Signed)
Subjective:  Patient ID: Rick Edwards, male    DOB: 03-03-1956  Age: 66 y.o. MRN: [id number]  CC: Hypertension and Hyperlipidemia   HPI Rick Edwards presents for f/up -  [place] the mall and has good endurance - he denies DOE, CP,SOB, edema.  Outpatient Medications Prior to Visit  Medication Sig Dispense Refill   hyoscyamine (LEVSIN SL) 0.125 MG SL tablet Place under the tongue 2 (two) times daily as needed.     potassium chloride (KLOR-CON) 10 MEQ tablet TAKE 1 TABLET BY MOUTH TWICE A DAY 180 tablet 0   allopurinol (ZYLOPRIM) 300 MG tablet TAKE 1 TABLET BY MOUTH EVERY DAY 90 tablet 0   amLODipine (NORVASC) 10 MG tablet Take 1 tablet (10 mg total) by mouth at bedtime. 90 tablet 1   atorvastatin (LIPITOR) 40 MG tablet TAKE 1 TABLET BY MOUTH EVERY DAY 90 tablet 0   carvedilol (COREG) 25 MG tablet Take 1 tablet (25 mg total) by mouth 2 (two) times daily with a meal. 180 tablet 1   levocetirizine (XYZAL) 5 MG tablet Take 1 tablet (5 mg total) by mouth every evening. 90 tablet 1   sildenafil (VIAGRA) 100 MG tablet Take 1 tablet (100 mg total) by mouth as needed for erectile dysfunction. 6 tablet 3   spironolactone (ALDACTONE) 25 MG tablet TAKE 1 TABLET (25 MG TOTAL) BY MOUTH DAILY. 90 tablet 1   torsemide (DEMADEX) 20 MG tablet TAKE 1 TABLET BY MOUTH EVERY DAY 90 tablet 0   VASCEPA 1 g capsule TAKE 2 CAPSULES BY MOUTH 2 TIMES DAILY. 360 capsule 1   warfarin (COUMADIN) 5 MG tablet TAKE 1.5 TABLETS DAILY, EXCEPT TAKE 1 TABLET ON MON, WED, AND FRI AS DIRECTED BY CLINIC. 108 tablet 1   fenofibrate (TRICOR) 145 MG tablet Take 1 tablet by mouth once daily 90 tablet 0   indapamide (LOZOL) 1.25 MG tablet Take 1 tablet (1.25 mg total) by mouth daily. 90 tablet 0   No facility-administered medications prior to visit.    ROS Review of Systems  Constitutional: Negative.  Negative for diaphoresis and fatigue.  HENT: Negative.    Eyes: Negative.   Respiratory:  Negative for cough,  chest tightness, shortness of breath and wheezing.   Cardiovascular:  Negative for chest pain, palpitations and leg swelling.  Gastrointestinal: Negative.  Negative for abdominal pain, constipation, diarrhea, nausea and vomiting.  Endocrine: Negative.   Genitourinary: Negative.  Negative for difficulty urinating, dysuria and hematuria.  Musculoskeletal:  Negative for arthralgias and myalgias.  Skin: Negative.   Neurological:  Negative for dizziness, weakness, light-headedness and headaches.  Hematological:  Negative for adenopathy. Does not bruise/bleed easily.  Psychiatric/Behavioral: Negative.      Objective:  BP 136/84 (BP Location: Left Arm, Patient Position: Sitting, Cuff Size: Large)   Pulse 64   Temp 98.5 F (36.9 C) (Oral)   Ht '5\' 9"'$  (1.753 m)   Wt 222 lb (100.7 kg)   SpO2 98%   BMI 32.78 kg/m   BP Readings from Last 3 Encounters:  05/09/22 136/84  01/11/22 (!) 160/96  07/27/21 134/77    Wt Readings from Last 3 Encounters:  05/09/22 222 lb (100.7 kg)  05/08/22 228 lb (103.4 kg)  01/11/22 228 lb (103.4 kg)    Physical Exam Vitals reviewed.  HENT:     Nose: Nose normal.     Mouth/Throat:     Mouth: Mucous membranes are moist.  Eyes:     General: No scleral  icterus.    Conjunctiva/sclera: Conjunctivae normal.  Cardiovascular:     Rate and Rhythm: Normal rate and regular rhythm.     Heart sounds: No murmur heard. Pulmonary:     Effort: Pulmonary effort is normal.     Breath sounds: No stridor. No wheezing, rhonchi or rales.  Abdominal:     General: Abdomen is flat.     Palpations: There is no mass.     Tenderness: There is no abdominal tenderness. There is no guarding.     Hernia: No hernia is present.  Musculoskeletal:        General: Normal range of motion.     Cervical back: Neck supple.     Right lower leg: No edema.     Left lower leg: No edema.  Lymphadenopathy:     Cervical: No cervical adenopathy.  Skin:    General: Skin is warm and dry.   Neurological:     General: No focal deficit present.     Mental Status: He is alert.  Psychiatric:        Mood and Affect: Mood normal.        Behavior: Behavior normal.     Lab Results  Component Value Date   WBC 6.6 05/09/2022   HGB 14.6 05/09/2022   HCT 43.2 05/09/2022   PLT 350.0 05/09/2022   GLUCOSE 110 (H) 05/09/2022   CHOL 156 01/11/2022   TRIG 101.0 01/11/2022   HDL 37.70 (L) 01/11/2022   LDLDIRECT 97.0 05/05/2019   LDLCALC 98 01/11/2022   ALT 17 05/09/2022   AST 14 05/09/2022   NA 135 05/09/2022   K 4.0 05/09/2022   CL 97 05/09/2022   CREATININE 1.18 05/09/2022   BUN 17 05/09/2022   CO2 30 05/09/2022   TSH 1.45 01/11/2022   PSA 2.99 01/11/2022   INR 2.0 04/21/2022   HGBA1C 6.1 05/09/2022   MICROALBUR 31.2 (H) 05/09/2022    No results found.  Assessment & Plan:   Rick Edwards was seen today for hypertension and hyperlipidemia.  Diagnoses and all orders for this visit:  Essential hypertension, malignant- His blood pressure is well controlled. -     Basic metabolic panel; Future -     CBC with Differential/Platelet; Future -     Hepatic function panel; Future -     amLODipine (NORVASC) 10 MG tablet; Take 1 tablet (10 mg total) by mouth at bedtime. -     carvedilol (COREG) 25 MG tablet; Take 1 tablet (25 mg total) by mouth 2 (two) times daily with a meal. -     spironolactone (ALDACTONE) 25 MG tablet; Take 1 tablet (25 mg total) by mouth daily. -     torsemide (DEMADEX) 20 MG tablet; Take 1 tablet (20 mg total) by mouth daily. -     Hepatic function panel -     CBC with Differential/Platelet -     Basic metabolic panel  Type II diabetes mellitus with manifestations ([place])- His blood sugar is well controlled. -     Basic metabolic panel; Future -     Hemoglobin A1c; Future -     Microalbumin / creatinine urine ratio; Future -     HM Diabetes Foot Exam -     Microalbumin / creatinine urine ratio -     Hemoglobin A1c -     Basic metabolic panel  Chronic  gout due to renal impairment of multiple sites without tophus -     Uric acid; Future -  allopurinol (ZYLOPRIM) 300 MG tablet; Take 1 tablet (300 mg total) by mouth daily. -     Uric acid  Other proteinuria -     Urinalysis, Routine w reflex microscopic; Future -     Microalbumin / creatinine urine ratio; Future -     Microalbumin / creatinine urine ratio -     Urinalysis, Routine w reflex microscopic  Hyperlipidemia with target LDL less than 70- LDL goal achieved. Doing well on the statin  -     atorvastatin (LIPITOR) 40 MG tablet; Take 1 tablet (40 mg total) by mouth daily.  Chronic hypokalemia -     spironolactone (ALDACTONE) 25 MG tablet; Take 1 tablet (25 mg total) by mouth daily.  Pure hypertriglyceridemia -     VASCEPA 1 g capsule; TAKE 2 CAPSULES BY MOUTH 2 TIMES DAILY.  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) -     warfarin (COUMADIN) 5 MG tablet; TAKE 1.5 TABLETS DAILY, EXCEPT TAKE 1 TABLET ON MON, WED, AND FRI AS DIRECTED BY CLINIC.  Long term (current) use of anticoagulants -     warfarin (COUMADIN) 5 MG tablet; TAKE 1.5 TABLETS DAILY, EXCEPT TAKE 1 TABLET ON MON, WED, AND FRI AS DIRECTED BY CLINIC.   I have discontinued Rick Edwards "Reggie"'s fenofibrate, indapamide, levocetirizine, and sildenafil. I have also changed his allopurinol, atorvastatin, and torsemide. Additionally, I am having him maintain his hyoscyamine, potassium chloride, amLODipine, carvedilol, spironolactone, Vascepa, and warfarin.  Meds ordered this encounter  Medications   allopurinol (ZYLOPRIM) 300 MG tablet    Sig: Take 1 tablet (300 mg total) by mouth daily.    Dispense:  90 tablet    Refill:  1   amLODipine (NORVASC) 10 MG tablet    Sig: Take 1 tablet (10 mg total) by mouth at bedtime.    Dispense:  90 tablet    Refill:  1   atorvastatin (LIPITOR) 40 MG tablet    Sig: Take 1 tablet (40 mg total) by mouth daily.    Dispense:  90 tablet    Refill:  1    carvedilol (COREG) 25 MG tablet    Sig: Take 1 tablet (25 mg total) by mouth 2 (two) times daily with a meal.    Dispense:  180 tablet    Refill:  1   spironolactone (ALDACTONE) 25 MG tablet    Sig: Take 1 tablet (25 mg total) by mouth daily.    Dispense:  90 tablet    Refill:  1   torsemide (DEMADEX) 20 MG tablet    Sig: Take 1 tablet (20 mg total) by mouth daily.    Dispense:  90 tablet    Refill:  1   VASCEPA 1 g capsule    Sig: TAKE 2 CAPSULES BY MOUTH 2 TIMES DAILY.    Dispense:  360 capsule    Refill:  1   warfarin (COUMADIN) 5 MG tablet    Sig: TAKE 1.5 TABLETS DAILY, EXCEPT TAKE 1 TABLET ON MON, WED, AND FRI AS DIRECTED BY CLINIC.    Dispense:  108 tablet    Refill:  1     Follow-up: Return in about 6 months (around 11/08/2022).  Scarlette Calico, MD

## 2022-06-02 ENCOUNTER — Ambulatory Visit (INDEPENDENT_AMBULATORY_CARE_PROVIDER_SITE_OTHER): Payer: Medicare Other

## 2022-06-02 DIAGNOSIS — Z7901 Long term (current) use of anticoagulants: Secondary | ICD-10-CM

## 2022-06-02 LAB — POCT INR: INR: 2.7 (ref 2.0–3.0)

## 2022-06-02 NOTE — Progress Notes (Signed)
Continue 1 tablet daily except take 1 1/2 tablets on Mondays, Wednesdays, and Fridays. Recheck in 6 weeks.   

## 2022-06-02 NOTE — Patient Instructions (Signed)
Continue 1 tablet daily except take 1 1/2 tablets on Mondays, Wednesdays, and Fridays. Recheck in 6 weeks.   

## 2022-06-25 ENCOUNTER — Other Ambulatory Visit: Payer: Self-pay | Admitting: Internal Medicine

## 2022-06-25 DIAGNOSIS — E876 Hypokalemia: Secondary | ICD-10-CM

## 2022-06-25 DIAGNOSIS — I10 Essential (primary) hypertension: Secondary | ICD-10-CM

## 2022-07-14 ENCOUNTER — Ambulatory Visit (INDEPENDENT_AMBULATORY_CARE_PROVIDER_SITE_OTHER): Payer: Medicare Other

## 2022-07-14 DIAGNOSIS — Z7901 Long term (current) use of anticoagulants: Secondary | ICD-10-CM | POA: Diagnosis not present

## 2022-07-14 LAB — POCT INR: INR: 2.1 (ref 2.0–3.0)

## 2022-07-14 NOTE — Patient Instructions (Addendum)
Pre visit review using our clinic review tool, if applicable. No additional management support is needed unless otherwise documented below in the visit note.  Continue 1 tablet daily except take 1 1/2 tablets on Mondays, Wednesdays, and Fridays. Recheck in 6 weeks.   

## 2022-07-14 NOTE — Progress Notes (Signed)
Continue 1 tablet daily except take 1 1/2 tablets on Mondays, Wednesdays, and Fridays. Recheck in 6 weeks.   

## 2022-08-06 ENCOUNTER — Other Ambulatory Visit: Payer: Self-pay | Admitting: Internal Medicine

## 2022-08-06 DIAGNOSIS — I743 Embolism and thrombosis of arteries of the lower extremities: Secondary | ICD-10-CM

## 2022-08-06 DIAGNOSIS — Z7901 Long term (current) use of anticoagulants: Secondary | ICD-10-CM

## 2022-08-22 ENCOUNTER — Encounter: Payer: Self-pay | Admitting: Internal Medicine

## 2022-08-22 ENCOUNTER — Ambulatory Visit (INDEPENDENT_AMBULATORY_CARE_PROVIDER_SITE_OTHER): Payer: Medicare Other

## 2022-08-22 ENCOUNTER — Ambulatory Visit (INDEPENDENT_AMBULATORY_CARE_PROVIDER_SITE_OTHER): Payer: Medicare Other | Admitting: Internal Medicine

## 2022-08-22 VITALS — BP 136/88 | HR 71 | Temp 98.2°F | Resp 16 | Ht 69.0 in | Wt 220.0 lb

## 2022-08-22 DIAGNOSIS — I160 Hypertensive urgency: Secondary | ICD-10-CM | POA: Diagnosis not present

## 2022-08-22 DIAGNOSIS — E118 Type 2 diabetes mellitus with unspecified complications: Secondary | ICD-10-CM | POA: Diagnosis not present

## 2022-08-22 DIAGNOSIS — I10 Essential (primary) hypertension: Secondary | ICD-10-CM | POA: Diagnosis not present

## 2022-08-22 DIAGNOSIS — Z7901 Long term (current) use of anticoagulants: Secondary | ICD-10-CM | POA: Diagnosis not present

## 2022-08-22 DIAGNOSIS — H6123 Impacted cerumen, bilateral: Secondary | ICD-10-CM

## 2022-08-22 LAB — BASIC METABOLIC PANEL
BUN: 14 mg/dL (ref 6–23)
CO2: 30 mEq/L (ref 19–32)
Calcium: 9.3 mg/dL (ref 8.4–10.5)
Chloride: 98 mEq/L (ref 96–112)
Creatinine, Ser: 1.13 mg/dL (ref 0.40–1.50)
GFR: 68.84 mL/min (ref >60.00)
Glucose, Bld: 109 mg/dL — ABNORMAL HIGH (ref 70–99)
Potassium: 4.4 mEq/L (ref 3.5–5.1)
Sodium: 136 mEq/L (ref 135–145)

## 2022-08-22 LAB — HEMOGLOBIN A1C: Hgb A1c MFr Bld: 6 % (ref 4.6–6.5)

## 2022-08-22 LAB — POCT INR: INR: 2 (ref 2.0–3.0)

## 2022-08-22 NOTE — Progress Notes (Signed)
PRE-PROCEDURE EXAM: Left and Right TM cannot be visualized due to total occlusion/impaction of the ear canal.  PROCEDURE INDICATION: remove wax to visualize ear drum & relieve discomfort  CONSENT:  Verbal     PROCEDURE NOTE:     LEFT and RIGHT EAR:  I used warm water irrigation under direct visualization with the otoscope to free the wax bolus from the ear canal.    POST- PROCEDURE EXAM: TMs successfully visualized and found to have no erythema     The patient tolerated the procedure well.

## 2022-08-22 NOTE — Progress Notes (Signed)
Subjective:  Patient ID: Rick Edwards, male    DOB: 04-19-1956  Age: 67 y.o. MRN: [id number]  CC: Hypertension and Diabetes   HPI Rick Edwards presents for f/up -  He complains of a several week history of decreased level of hearing despite wearing his hearing aids.  He is active and denies chest pain, shortness of breath, diaphoresis, or edema.  Outpatient Medications Prior to Visit  Medication Sig Dispense Refill   allopurinol (ZYLOPRIM) 300 MG tablet Take 1 tablet (300 mg total) by mouth daily. 90 tablet 1   amLODipine (NORVASC) 10 MG tablet Take 1 tablet (10 mg total) by mouth at bedtime. 90 tablet 1   atorvastatin (LIPITOR) 40 MG tablet Take 1 tablet (40 mg total) by mouth daily. 90 tablet 1   carvedilol (COREG) 25 MG tablet Take 1 tablet (25 mg total) by mouth 2 (two) times daily with a meal. 180 tablet 1   hyoscyamine (LEVSIN SL) 0.125 MG SL tablet Place under the tongue 2 (two) times daily as needed.     potassium chloride (KLOR-CON) 10 MEQ tablet TAKE 1 TABLET BY MOUTH TWICE A DAY 180 tablet 0   spironolactone (ALDACTONE) 25 MG tablet Take 1 tablet (25 mg total) by mouth daily. 90 tablet 1   torsemide (DEMADEX) 20 MG tablet Take 1 tablet (20 mg total) by mouth daily. 90 tablet 1   VASCEPA 1 g capsule TAKE 2 CAPSULES BY MOUTH 2 TIMES DAILY. 360 capsule 1   warfarin (COUMADIN) 5 MG tablet TAKE 1.5 TABLETS DAILY, EXCEPT TAKE 1 TABLET ON MON, WED, AND FRI AS DIRECTED BY CLINIC. 108 tablet 1   No facility-administered medications prior to visit.    ROS Review of Systems  Constitutional:  Negative for diaphoresis and fatigue.  HENT:  Positive for hearing loss. Negative for ear discharge, ear pain, facial swelling and sore throat.   Eyes: Negative.   Respiratory: Negative.  Negative for cough, chest tightness and wheezing.   Cardiovascular:  Negative for chest pain, palpitations and leg swelling.  Gastrointestinal:  Negative for abdominal pain, constipation, diarrhea,  nausea and vomiting.  Genitourinary:  Negative for difficulty urinating.  Musculoskeletal: Negative.   Skin: Negative.   Neurological: Negative.  Negative for dizziness and weakness.  Hematological:  Negative for adenopathy. Does not bruise/bleed easily.  Psychiatric/Behavioral: Negative.      Objective:  BP 136/88 (BP Location: Left Arm, Patient Position: Sitting, Cuff Size: Large)   Pulse 71   Temp 98.2 F (36.8 C) (Oral)   Resp 16   Ht '5\' 9"'$  (1.753 m)   Wt 220 lb (99.8 kg)   SpO2 97%   BMI 32.49 kg/m   BP Readings from Last 3 Encounters:  08/22/22 136/88  05/09/22 136/84  01/11/22 (!) 160/96    Wt Readings from Last 3 Encounters:  08/22/22 220 lb (99.8 kg)  05/09/22 222 lb (100.7 kg)  05/08/22 228 lb (103.4 kg)    Physical Exam Vitals reviewed.  Constitutional:      Appearance: Normal appearance.  HENT:     Right Ear: Decreased hearing noted. There is impacted cerumen. No foreign body.     Left Ear: Decreased hearing noted. There is impacted cerumen. No foreign body.     Ears:     Comments: I put Colace in both EACs and then we used water irrigation to remove the cerumen.  He tolerated this well.  His hearing has improved.  The examination afterwards is normal.  Mouth/Throat:     Mouth: Mucous membranes are moist.  Eyes:     General: No scleral icterus.    Conjunctiva/sclera: Conjunctivae normal.  Cardiovascular:     Rate and Rhythm: Normal rate and regular rhythm.     Heart sounds: No murmur heard. Pulmonary:     Breath sounds: No stridor. No wheezing, rhonchi or rales.  Abdominal:     General: Abdomen is flat.     Palpations: There is no mass.     Tenderness: There is no abdominal tenderness. There is no guarding.     Hernia: No hernia is present.  Musculoskeletal:        General: Normal range of motion.     Cervical back: Neck supple.     Right lower leg: No edema.     Left lower leg: No edema.  Lymphadenopathy:     Cervical: No cervical  adenopathy.  Skin:    General: Skin is warm and dry.  Neurological:     General: No focal deficit present.     Mental Status: He is alert. Mental status is at baseline.  Psychiatric:        Mood and Affect: Mood normal.        Behavior: Behavior normal.     Lab Results  Component Value Date   WBC 6.6 05/09/2022   HGB 14.6 05/09/2022   HCT 43.2 05/09/2022   PLT 350.0 05/09/2022   GLUCOSE 109 (H) 08/22/2022   CHOL 156 01/11/2022   TRIG 101.0 01/11/2022   HDL 37.70 (L) 01/11/2022   LDLDIRECT 97.0 05/05/2019   LDLCALC 98 01/11/2022   ALT 17 05/09/2022   AST 14 05/09/2022   NA 136 08/22/2022   K 4.4 08/22/2022   CL 98 08/22/2022   CREATININE 1.13 08/22/2022   BUN 14 08/22/2022   CO2 30 08/22/2022   TSH 1.45 01/11/2022   PSA 2.99 01/11/2022   INR 2.0 08/22/2022   HGBA1C 6.0 08/22/2022   MICROALBUR 31.2 (H) 05/09/2022    No results found.  Assessment & Plan:   Rick Edwards was seen today for hypertension and diabetes.  Diagnoses and all orders for this visit:  Hypertensive urgency, malignant  Essential hypertension, malignant- His blood pressure is adequately well-controlled. -     Basic metabolic panel; Future -     Basic metabolic panel  Type II diabetes mellitus with manifestations ([place])- His blood sugar is well-controlled. -     Basic metabolic panel; Future -     Hemoglobin A1c; Future -     Hemoglobin A1c -     Basic metabolic panel  Hearing loss due to cerumen impaction, bilateral- Much improved after the procedure.   I am having Rick Gin "Reggie" maintain his hyoscyamine, allopurinol, amLODipine, atorvastatin, carvedilol, spironolactone, torsemide, Vascepa, potassium chloride, and warfarin.  No orders of the defined types were placed in this encounter.    Follow-up: Return in about 4 months (around 12/21/2022).  Rick Calico, MD

## 2022-08-22 NOTE — Patient Instructions (Signed)
Hypertension, Adult High blood pressure (hypertension) is when the force of blood pumping through the arteries is too strong. The arteries are the blood vessels that carry blood from the heart throughout the body. Hypertension forces the heart to work harder to pump blood and may cause arteries to become narrow or stiff. Untreated or uncontrolled hypertension can lead to a heart attack, heart failure, a stroke, kidney disease, and other problems. A blood pressure reading consists of a higher number over a lower number. Ideally, your blood pressure should be below 120/80. The first ("top") number is called the systolic pressure. It is a measure of the pressure in your arteries as your heart beats. The second ("bottom") number is called the diastolic pressure. It is a measure of the pressure in your arteries as the heart relaxes. What are the causes? The exact cause of this condition is not known. There are some conditions that result in high blood pressure. What increases the risk? Certain factors may make you more likely to develop high blood pressure. Some of these risk factors are under your control, including: Smoking. Not getting enough exercise or physical activity. Being overweight. Having too much fat, sugar, calories, or salt (sodium) in your diet. Drinking too much alcohol. Other risk factors include: Having a personal history of heart disease, diabetes, high cholesterol, or kidney disease. Stress. Having a family history of high blood pressure and high cholesterol. Having obstructive sleep apnea. Age. The risk increases with age. What are the signs or symptoms? High blood pressure may not cause symptoms. Very high blood pressure (hypertensive crisis) may cause: Headache. Fast or irregular heartbeats (palpitations). Shortness of breath. Nosebleed. Nausea and vomiting. Vision changes. Severe chest pain, dizziness, and seizures. How is this diagnosed? This condition is diagnosed by  measuring your blood pressure while you are seated, with your arm resting on a flat surface, your legs uncrossed, and your feet flat on the floor. The cuff of the blood pressure monitor will be placed directly against the skin of your upper arm at the level of your heart. Blood pressure should be measured at least twice using the same arm. Certain conditions can cause a difference in blood pressure between your right and left arms. If you have a high blood pressure reading during one visit or you have normal blood pressure with other risk factors, you may be asked to: Return on a different day to have your blood pressure checked again. Monitor your blood pressure at home for 1 week or longer. If you are diagnosed with hypertension, you may have other blood or imaging tests to help your health care provider understand your overall risk for other conditions. How is this treated? This condition is treated by making healthy lifestyle changes, such as eating healthy foods, exercising more, and reducing your alcohol intake. You may be referred for counseling on a healthy diet and physical activity. Your health care provider may prescribe medicine if lifestyle changes are not enough to get your blood pressure under control and if: Your systolic blood pressure is above 130. Your diastolic blood pressure is above 80. Your personal target blood pressure may vary depending on your medical conditions, your age, and other factors. Follow these instructions at home: Eating and drinking  Eat a diet that is high in fiber and potassium, and low in sodium, added sugar, and fat. An example of this eating plan is called the DASH diet. DASH stands for Dietary Approaches to Stop Hypertension. To eat this way: Eat   plenty of fresh fruits and vegetables. Try to fill one half of your plate at each meal with fruits and vegetables. Eat whole grains, such as whole-wheat pasta, brown rice, or whole-grain bread. Fill about one  fourth of your plate with whole grains. Eat or drink low-fat dairy products, such as skim milk or low-fat yogurt. Avoid fatty cuts of meat, processed or cured meats, and poultry with skin. Fill about one fourth of your plate with lean proteins, such as fish, chicken without skin, beans, eggs, or tofu. Avoid pre-made and processed foods. These tend to be higher in sodium, added sugar, and fat. Reduce your daily sodium intake. Many people with hypertension should eat less than 1,500 mg of sodium a day. Do not drink alcohol if: Your health care provider tells you not to drink. You are pregnant, may be pregnant, or are planning to become pregnant. If you drink alcohol: Limit how much you have to: 0-1 drink a day for women. 0-2 drinks a day for men. Know how much alcohol is in your drink. In the [place] one drink equals one 12 oz bottle of beer (355 mL), one 5 oz glass of wine (148 mL), or one 1 oz glass of hard liquor (44 mL). Lifestyle  Work with your health care provider to maintain a healthy body weight or to lose weight. Ask what an ideal weight is for you. Get at least 30 minutes of exercise that causes your heart to beat faster (aerobic exercise) most days of the week. Activities may include walking, swimming, or biking. Include exercise to strengthen your muscles (resistance exercise), such as Pilates or lifting weights, as part of your weekly exercise routine. Try to do these types of exercises for 30 minutes at least 3 days a week. Do not use any products that contain nicotine or tobacco. These products include cigarettes, chewing tobacco, and vaping devices, such as e-cigarettes. If you need help quitting, ask your health care provider. Monitor your blood pressure at home as told by your health care provider. Keep all follow-up visits. This is important. Medicines Take over-the-counter and prescription medicines only as told by your health care provider. Follow directions carefully. Blood  pressure medicines must be taken as prescribed. Do not skip doses of blood pressure medicine. Doing this puts you at risk for problems and can make the medicine less effective. Ask your health care provider about side effects or reactions to medicines that you should watch for. Contact a health care provider if you: Think you are having a reaction to a medicine you are taking. Have headaches that keep coming back (recurring). Feel dizzy. Have swelling in your ankles. Have trouble with your vision. Get help right away if you: Develop a severe headache or confusion. Have unusual weakness or numbness. Feel faint. Have severe pain in your chest or abdomen. Vomit repeatedly. Have trouble breathing. These symptoms may be an emergency. Get help right away. Call 911. Do not wait to see if the symptoms will go away. Do not drive yourself to the hospital. Summary Hypertension is when the force of blood pumping through your arteries is too strong. If this condition is not controlled, it may put you at risk for serious complications. Your personal target blood pressure may vary depending on your medical conditions, your age, and other factors. For most people, a normal blood pressure is less than 120/80. Hypertension is treated with lifestyle changes, medicines, or a combination of both. Lifestyle changes include losing weight, eating a healthy,   low-sodium diet, exercising more, and limiting alcohol. This information is not intended to replace advice given to you by your health care provider. Make sure you discuss any questions you have with your health care provider. Document Revised: 05/31/2021 Document Reviewed: 05/31/2021 Elsevier Patient Education  2023 [employer] Inc.  

## 2022-08-22 NOTE — Progress Notes (Signed)
Continue 1 tablet daily except take 1 1/2 tablets on Mondays, Wednesdays, and Fridays. Recheck in 6 weeks.   

## 2022-08-22 NOTE — Patient Instructions (Addendum)
Pre visit review using our clinic review tool, if applicable. No additional management support is needed unless otherwise documented below in the visit note.  Continue 1 tablet daily except take 1 1/2 tablets on Mondays, Wednesdays, and Fridays. Recheck in 6 weeks.   

## 2022-09-18 ENCOUNTER — Other Ambulatory Visit: Payer: Self-pay | Admitting: Internal Medicine

## 2022-09-18 DIAGNOSIS — I10 Essential (primary) hypertension: Secondary | ICD-10-CM

## 2022-09-18 DIAGNOSIS — E876 Hypokalemia: Secondary | ICD-10-CM

## 2022-10-03 ENCOUNTER — Ambulatory Visit (INDEPENDENT_AMBULATORY_CARE_PROVIDER_SITE_OTHER): Payer: Medicare Other

## 2022-10-03 DIAGNOSIS — Z7901 Long term (current) use of anticoagulants: Secondary | ICD-10-CM

## 2022-10-03 LAB — POCT INR: INR: 2.3 (ref 2.0–3.0)

## 2022-10-03 NOTE — Progress Notes (Signed)
Continue 1 tablet daily except take 1 1/2 tablets on Mondays, Wednesdays, and Fridays. Recheck in 6 weeks.

## 2022-10-03 NOTE — Patient Instructions (Addendum)
Pre visit review using our clinic review tool, if applicable. No additional management support is needed unless otherwise documented below in the visit note.  Continue 1 tablet daily except take 1 1/2 tablets on Mondays, Wednesdays, and Fridays. Recheck in 6 weeks.

## 2022-11-14 ENCOUNTER — Ambulatory Visit (INDEPENDENT_AMBULATORY_CARE_PROVIDER_SITE_OTHER): Payer: Medicare Other

## 2022-11-14 DIAGNOSIS — Z7901 Long term (current) use of anticoagulants: Secondary | ICD-10-CM | POA: Diagnosis not present

## 2022-11-14 LAB — POCT INR: INR: 2 (ref 2.0–3.0)

## 2022-11-14 NOTE — Progress Notes (Signed)
Continue 1 tablet daily except take 1 1/2 tablets on Mondays, Wednesdays, and Fridays. Recheck in 6 weeks.   

## 2022-11-14 NOTE — Patient Instructions (Addendum)
Pre visit review using our clinic review tool, if applicable. No additional management support is needed unless otherwise documented below in the visit note.  Continue 1 tablet daily except take 1 1/2 tablets on Mondays, Wednesdays, and Fridays. Recheck in 6 weeks. 

## 2022-11-22 ENCOUNTER — Encounter: Payer: Self-pay | Admitting: *Deleted

## 2022-12-08 ENCOUNTER — Other Ambulatory Visit: Payer: Self-pay | Admitting: Internal Medicine

## 2022-12-08 DIAGNOSIS — I10 Essential (primary) hypertension: Secondary | ICD-10-CM

## 2022-12-16 ENCOUNTER — Other Ambulatory Visit: Payer: Self-pay | Admitting: Internal Medicine

## 2022-12-16 DIAGNOSIS — I10 Essential (primary) hypertension: Secondary | ICD-10-CM

## 2022-12-16 DIAGNOSIS — E876 Hypokalemia: Secondary | ICD-10-CM

## 2022-12-26 ENCOUNTER — Ambulatory Visit (INDEPENDENT_AMBULATORY_CARE_PROVIDER_SITE_OTHER): Payer: Medicare Other

## 2022-12-26 ENCOUNTER — Encounter: Payer: Self-pay | Admitting: Internal Medicine

## 2022-12-26 ENCOUNTER — Ambulatory Visit (INDEPENDENT_AMBULATORY_CARE_PROVIDER_SITE_OTHER): Payer: Medicare Other | Admitting: Internal Medicine

## 2022-12-26 VITALS — BP 122/78 | HR 70 | Temp 98.2°F | Resp 16 | Ht 69.0 in | Wt 211.0 lb

## 2022-12-26 DIAGNOSIS — D6859 Other primary thrombophilia: Secondary | ICD-10-CM | POA: Diagnosis not present

## 2022-12-26 DIAGNOSIS — Z7901 Long term (current) use of anticoagulants: Secondary | ICD-10-CM | POA: Diagnosis not present

## 2022-12-26 DIAGNOSIS — I700 Atherosclerosis of aorta: Secondary | ICD-10-CM

## 2022-12-26 DIAGNOSIS — I10 Essential (primary) hypertension: Secondary | ICD-10-CM

## 2022-12-26 DIAGNOSIS — E785 Hyperlipidemia, unspecified: Secondary | ICD-10-CM | POA: Diagnosis not present

## 2022-12-26 DIAGNOSIS — R972 Elevated prostate specific antigen [PSA]: Secondary | ICD-10-CM | POA: Diagnosis not present

## 2022-12-26 DIAGNOSIS — E118 Type 2 diabetes mellitus with unspecified complications: Secondary | ICD-10-CM

## 2022-12-26 DIAGNOSIS — N400 Enlarged prostate without lower urinary tract symptoms: Secondary | ICD-10-CM | POA: Diagnosis not present

## 2022-12-26 DIAGNOSIS — N1831 Chronic kidney disease, stage 3a: Secondary | ICD-10-CM

## 2022-12-26 DIAGNOSIS — H6122 Impacted cerumen, left ear: Secondary | ICD-10-CM

## 2022-12-26 DIAGNOSIS — R808 Other proteinuria: Secondary | ICD-10-CM

## 2022-12-26 LAB — BASIC METABOLIC PANEL
BUN: 16 mg/dL (ref 6–23)
CO2: 29 mEq/L (ref 19–32)
Calcium: 9.3 mg/dL (ref 8.4–10.5)
Chloride: 100 mEq/L (ref 96–112)
Creatinine, Ser: 1.03 mg/dL (ref 0.40–1.50)
GFR: 76.75 mL/min (ref >60.00)
Glucose, Bld: 105 mg/dL — ABNORMAL HIGH (ref 70–99)
Potassium: 4.5 mEq/L (ref 3.5–5.1)
Sodium: 138 mEq/L (ref 135–145)

## 2022-12-26 LAB — CBC WITH DIFFERENTIAL/PLATELET
Basophils Absolute: 0.1 10*3/uL (ref 0.0–0.1)
Basophils Relative: 1.2 % (ref 0.0–3.0)
Eosinophils Absolute: 0.3 10*3/uL (ref 0.0–0.7)
Eosinophils Relative: 4 % (ref 0.0–5.0)
HCT: 44.7 % (ref 39.0–52.0)
Hemoglobin: 14.9 g/dL (ref 13.0–17.0)
Lymphocytes Relative: 27 % (ref 12.0–46.0)
Lymphs Abs: 1.8 10*3/uL (ref 0.7–4.0)
MCHC: 33.3 g/dL (ref 30.0–36.0)
MCV: 92.9 fl (ref 78.0–100.0)
Monocytes Absolute: 0.7 10*3/uL (ref 0.1–1.0)
Monocytes Relative: 11.1 % (ref 3.0–12.0)
Neutro Abs: 3.7 10*3/uL (ref 1.4–7.7)
Neutrophils Relative %: 56.7 % (ref 43.0–77.0)
Platelets: 368 10*3/uL (ref 150.0–400.0)
RBC: 4.81 Mil/uL (ref 4.22–5.81)
RDW: 15.8 % — ABNORMAL HIGH (ref 11.5–15.5)
WBC: 6.5 10*3/uL (ref 4.0–10.5)

## 2022-12-26 LAB — URINALYSIS, ROUTINE W REFLEX MICROSCOPIC
Bilirubin Urine: NEGATIVE
Hgb urine dipstick: NEGATIVE
Ketones, ur: NEGATIVE
Leukocytes,Ua: NEGATIVE
Nitrite: NEGATIVE
RBC / HPF: NONE SEEN (ref 0–?)
Specific Gravity, Urine: 1.015 (ref 1.000–1.030)
Total Protein, Urine: 30 — AB
Urine Glucose: NEGATIVE
Urobilinogen, UA: 0.2 (ref 0.0–1.0)
WBC, UA: NONE SEEN (ref 0–?)
pH: 6 (ref 5.0–8.0)

## 2022-12-26 LAB — LIPID PANEL
Cholesterol: 148 mg/dL (ref 0–200)
HDL: 34.5 mg/dL — ABNORMAL LOW (ref >39.00)
LDL Cholesterol: 91 mg/dL (ref 0–99)
NonHDL: 113.81
Total CHOL/HDL Ratio: 4
Triglycerides: 114 mg/dL (ref 0.0–149.0)
VLDL: 22.8 mg/dL (ref 0.0–40.0)

## 2022-12-26 LAB — HEPATIC FUNCTION PANEL
ALT: 16 U/L (ref 0–53)
AST: 12 U/L (ref 0–37)
Albumin: 4.2 g/dL (ref 3.5–5.2)
Alkaline Phosphatase: 98 U/L (ref 39–117)
Bilirubin, Direct: 0.2 mg/dL (ref 0.0–0.3)
Total Bilirubin: 1.1 mg/dL (ref 0.2–1.2)
Total Protein: 6.7 g/dL (ref 6.0–8.3)

## 2022-12-26 LAB — PSA: PSA: 4.79 ng/mL — ABNORMAL HIGH (ref 0.10–4.00)

## 2022-12-26 LAB — HEMOGLOBIN A1C: Hgb A1c MFr Bld: 5.9 % (ref 4.6–6.5)

## 2022-12-26 LAB — TSH: TSH: 1.41 u[IU]/mL (ref 0.35–5.50)

## 2022-12-26 LAB — POCT INR: INR: 2.4 (ref 2.0–3.0)

## 2022-12-26 NOTE — Progress Notes (Signed)
PRE-PROCEDURE EXAM: Left TM cannot be visualized due to total occlusion/impaction of the ear canal.  PROCEDURE INDICATION: remove wax to visualize ear drum & relieve discomfort  CONSENT:  Verbal     PROCEDURE NOTE:     LEFT EAR:  I used warm water irrigation under direct visualization with the otoscope to free the wax bolus from the ear canal.    POST- PROCEDURE EXAM: TMs successfully visualized and found to have no erythema     The patient tolerated the procedure well.   

## 2022-12-26 NOTE — Patient Instructions (Addendum)
Pre visit review using our clinic review tool, if applicable. No additional management support is needed unless otherwise documented below in the visit note.  Continue 1 tablet daily except take 1 1/2 tablets on Mondays, Wednesdays, and Fridays. Recheck in 6 weeks. 

## 2022-12-26 NOTE — Progress Notes (Signed)
Subjective:  Patient ID: Rick Edwards, male    DOB: April 22, 1956  Age: 67 y.o. MRN: [id number]  CC: Hypertension   HPI ALAIN MICALLEF presents for f/up ----  He is active and denies DOE/CP/SOB/edema.  Outpatient Medications Prior to Visit  Medication Sig Dispense Refill   allopurinol (ZYLOPRIM) 300 MG tablet Take 1 tablet (300 mg total) by mouth daily. 90 tablet 1   amLODipine (NORVASC) 10 MG tablet Take 1 tablet (10 mg total) by mouth at bedtime. 90 tablet 1   atorvastatin (LIPITOR) 40 MG tablet Take 1 tablet (40 mg total) by mouth daily. 90 tablet 1   carvedilol (COREG) 25 MG tablet Take 1 tablet (25 mg total) by mouth 2 (two) times daily with a meal. 180 tablet 1   hyoscyamine (LEVSIN SL) 0.125 MG SL tablet Place under the tongue 2 (two) times daily as needed.     potassium chloride (KLOR-CON) 10 MEQ tablet TAKE 1 TABLET BY MOUTH TWICE A DAY 180 tablet 0   spironolactone (ALDACTONE) 25 MG tablet Take 1 tablet (25 mg total) by mouth daily. 90 tablet 1   torsemide (DEMADEX) 20 MG tablet TAKE 1 TABLET BY MOUTH EVERY DAY 90 tablet 0   VASCEPA 1 g capsule TAKE 2 CAPSULES BY MOUTH 2 TIMES DAILY. 360 capsule 1   warfarin (COUMADIN) 5 MG tablet TAKE 1.5 TABLETS DAILY, EXCEPT TAKE 1 TABLET ON MON, WED, AND FRI AS DIRECTED BY CLINIC. 108 tablet 1   No facility-administered medications prior to visit.    ROS Review of Systems  Constitutional: Negative.  Negative for diaphoresis and fatigue.  HENT:  Positive for hearing loss. Negative for sinus pressure.   Eyes: Negative.   Respiratory:  Negative for cough, chest tightness and wheezing.   Cardiovascular:  Negative for chest pain, palpitations and leg swelling.  Gastrointestinal:  Negative for abdominal pain, constipation, diarrhea, nausea and vomiting.  Endocrine: Negative.   Genitourinary: Negative.  Negative for difficulty urinating.  Musculoskeletal: Negative.  Negative for arthralgias and myalgias.  Skin: Negative.    Neurological:  Negative for dizziness, weakness and light-headedness.  Hematological:  Negative for adenopathy. Does not bruise/bleed easily.  Psychiatric/Behavioral: Negative.      Objective:  BP 122/78 (BP Location: Left Arm, Patient Position: Sitting, Cuff Size: Large)   Pulse 70   Temp 98.2 F (36.8 C) (Oral)   Ht 5\' 9"  (1.753 m)   Wt 211 lb (95.7 kg)   SpO2 94%   BMI 31.16 kg/m   BP Readings from Last 3 Encounters:  12/26/22 122/78  08/22/22 136/88  05/09/22 136/84    Wt Readings from Last 3 Encounters:  12/26/22 211 lb (95.7 kg)  08/22/22 220 lb (99.8 kg)  05/09/22 222 lb (100.7 kg)    Physical Exam Vitals reviewed.  Constitutional:      Appearance: Normal appearance.  HENT:     Right Ear: Tympanic membrane normal. Decreased hearing noted. There is impacted cerumen. No foreign body.     Left Ear: Tympanic membrane normal. Decreased hearing noted. There is no impacted cerumen. No foreign body.     Mouth/Throat:     Mouth: Mucous membranes are moist.  Eyes:     General: No scleral icterus.    Conjunctiva/sclera: Conjunctivae normal.  Cardiovascular:     Rate and Rhythm: Normal rate and regular rhythm.     Heart sounds: No murmur heard. Pulmonary:     Effort: Pulmonary effort is normal.     Breath  sounds: No stridor. No wheezing, rhonchi or rales.  Abdominal:     General: Abdomen is flat.     Palpations: There is no mass.     Tenderness: There is no abdominal tenderness. There is no guarding.     Hernia: No hernia is present.  Musculoskeletal:        General: Normal range of motion.     Cervical back: Neck supple.     Right lower leg: No edema.     Left lower leg: No edema.  Lymphadenopathy:     Cervical: No cervical adenopathy.  Skin:    General: Skin is warm and dry.  Neurological:     General: No focal deficit present.     Mental Status: He is alert. Mental status is at baseline.  Psychiatric:        Mood and Affect: Mood normal.         Behavior: Behavior normal.     Lab Results  Component Value Date   WBC 6.5 12/26/2022   HGB 14.9 12/26/2022   HCT 44.7 12/26/2022   PLT 368.0 12/26/2022   GLUCOSE 105 (H) 12/26/2022   CHOL 148 12/26/2022   TRIG 114.0 12/26/2022   HDL 34.50 (L) 12/26/2022   LDLDIRECT 97.0 05/05/2019   LDLCALC 91 12/26/2022   ALT 16 12/26/2022   AST 12 12/26/2022   NA 138 12/26/2022   K 4.5 12/26/2022   CL 100 12/26/2022   CREATININE 1.03 12/26/2022   BUN 16 12/26/2022   CO2 29 12/26/2022   TSH 1.41 12/26/2022   PSA 4.79 (H) 12/26/2022   INR 2.4 12/26/2022   HGBA1C 5.9 12/26/2022   MICROALBUR 31.2 (H) 05/09/2022    No results found.  Assessment & Plan:  Benign prostatic hyperplasia without lower urinary tract symptoms -     Urinalysis, Routine w reflex microscopic; Future -     PSA; Future  Atherosclerosis of aorta (HCC)- Risk factor modifications addressed.  Antithrombin III deficiency (HCC) - Will continue coumadin.  Essential hypertension, malignant -     CBC with Differential/Platelet; Future -     Urinalysis, Routine w reflex microscopic; Future -     Hepatic function panel; Future -     TSH; Future  Hyperlipidemia with target LDL less than 70- LDL goal achieved. Doing well on the statin  -     Lipid panel; Future -     Hepatic function panel; Future -     TSH; Future  Stage 3a chronic kidney disease (HCC)- Renal function is stable. -     Basic metabolic panel; Future -     Urinalysis, Routine w reflex microscopic; Future  Other proteinuria -     Urinalysis, Routine w reflex microscopic; Future  Type II diabetes mellitus with manifestations (HCC) - Blood sugar is well controlled. -     Basic metabolic panel; Future -     Hemoglobin A1c; Future  Hearing loss secondary to cerumen impaction, left - Cerumen removed.  Rising PSA level -     Ambulatory referral to Urology     Follow-up: Return in about 4 months (around 04/28/2023).  Sanda Linger, MD

## 2022-12-26 NOTE — Progress Notes (Signed)
Pt is also seeing PCP today so no charge was added for management. Continue 1 tablet daily except take 1 1/2 tablets on Mondays, Wednesdays, and Fridays. Recheck in 6 weeks.

## 2022-12-26 NOTE — Patient Instructions (Signed)
Proteinuria Proteinuria is when there is too much protein in the urine. Proteins help build muscles and bones. They are also needed to fight infections, help the blood clot, and keep body fluids in balance. Too much protein may be a sign of a problem with the kidneys. The kidneys make urine. They also help keep substances like proteins from leaving the blood and ending up in the urine. In some cases, proteinuria may be mild and short-term. In other cases, it may be an early sign of kidney disease. What are the causes? This condition may be caused by kidney damage or stress on the kidneys. Kidney damage Healthy kidneys have filters called glomeruli that keep proteins out of the urine. This condition can happen when the glomeruli are damaged. This may be from: Diabetes. High blood pressure. Injuries or poisons (toxins). Other causes of kidney damage include: Diseases that affect the body's defense system (immune system). These include lupus, rheumatoid arthritis, sarcoidosis, and Goodpasture syndrome. Infections in the kidney or bloodstream. Problems in other parts of the body that may injure the kidney. These include heart disease. Certain cancers. These include kidney cancer, lymphoma, leukemia, and multiple myeloma. Amyloidosis. This is a disease that causes too many proteins to build up in body tissues. High blood pressure during pregnancy (preeclampsia and eclampsia). Certain medicines. These include NSAIDs, such as ibuprofen. Stress on the kidneys Short-term proteinuria may be caused by conditions that put stress on the kidneys. In most cases, these conditions do not cause kidney damage. They include: Fever. Exposure to cold or heat. Emotional or physical stress. Extreme exercise. Standing for long periods of time. What increases the risk? You are more likely to develop this condition if: You have certain conditions. These include diabetes, high blood pressure, and heart disease. You  have an immune disease, cancer, or other disease that affects your kidneys. You have a family history of kidney disease. You are 65 years of age or older. You are overweight. You are pregnant. You have an infection. What are the signs or symptoms? Mild proteinuria may not cause symptoms. As more proteins enter the urine, symptoms of kidney disease may develop. These may include: Foamy urine. You may also have to urinate more often. Swelling of the face, abdomen, hands, legs, or feet (edema). Sleeping issues or tiredness (fatigue). Dry and itchy skin. Nausea and vomiting. Muscle cramps. Shortness of breath. How is this diagnosed? This condition may be diagnosed with a urine test. You may have this test as part of a routine physical exam. You may also have this test because you have symptoms of kidney disease or risk factors for the disease. You may also have: Blood tests to measure the level of a substance called creatinine in your blood. Levels of creatinine increase with kidney disease. Imaging tests of your kidney, such as a CT scan or ultrasound. These may be done to look for signs of kidney damage. How is this treated? If have mild or short-term proteinuria, you may not need treatment. Your health care provider may show you how to monitor the level of protein in your urine at home. Treatment depends on the cause of your condition. Treatment may include: Changes to your diet and lifestyle. Getting your blood pressure under control. Getting blood sugar under control, if you have diabetes. Managing other conditions you have that affect your kidneys. Giving birth, if you are pregnant. Staying away from medicines that damage your kidneys. In severe cases, kidney disease may need to be treated   with medicines or dialysis. Dialysis is when a machine is used to do the job of the kidneys. Follow these instructions at home: Eating and drinking Follow instructions from your health care  provider about what you may eat and drink. Get to, and maintain, a healthy weight. Ask your health care provider about diets that can help. Activity Ask your health care provider what exercise program is best for you. Return to your normal activities as told by your health care provider. Ask your health care provider what activities are safe for you. General instructions Take over-the-counter and prescription medicines only as told by your health care provider. Check your protein levels at home as told by your health care provider. Keep all follow-up visits. Your health care provider will monitor your kidneys. Contact a health care provider if: You have new symptoms. Your symptoms get worse or do not get better. You have back or side pain. You are vomiting or have diarrhea, and you cannot eat or drink anything. You have a fever. Get help right away if: You have shortness of breath or chest pain. These symptoms may be an emergency. Get help right away. Call 911. Do not wait to see if the symptoms will go away. Do not drive yourself to the hospital. This information is not intended to replace advice given to you by your health care provider. Make sure you discuss any questions you have with your health care provider. Document Revised: 02/10/2022 Document Reviewed: 02/10/2022 Elsevier Patient Education  2023 [employer].  

## 2023-01-07 ENCOUNTER — Other Ambulatory Visit: Payer: Self-pay | Admitting: Internal Medicine

## 2023-01-07 DIAGNOSIS — E785 Hyperlipidemia, unspecified: Secondary | ICD-10-CM

## 2023-01-10 ENCOUNTER — Encounter: Payer: Self-pay | Admitting: Urology

## 2023-01-10 ENCOUNTER — Ambulatory Visit (INDEPENDENT_AMBULATORY_CARE_PROVIDER_SITE_OTHER): Payer: Medicare Other | Admitting: Urology

## 2023-01-10 VITALS — BP 136/86 | HR 64 | Ht 69.0 in | Wt 209.0 lb

## 2023-01-10 DIAGNOSIS — N400 Enlarged prostate without lower urinary tract symptoms: Secondary | ICD-10-CM | POA: Diagnosis not present

## 2023-01-10 DIAGNOSIS — R972 Elevated prostate specific antigen [PSA]: Secondary | ICD-10-CM

## 2023-01-10 LAB — MICROSCOPIC EXAMINATION
Cast Type: NONE SEEN
Casts: NONE SEEN /lpf
Crystal Type: NONE SEEN
Crystals: NONE SEEN
Mucus, UA: NONE SEEN
RBC, Urine: NONE SEEN /hpf (ref 0–2)
Renal Epithel, UA: NONE SEEN /hpf
Trichomonas, UA: NONE SEEN
Yeast, UA: NONE SEEN

## 2023-01-10 LAB — URINALYSIS, ROUTINE W REFLEX MICROSCOPIC
Bilirubin, UA: NEGATIVE
Glucose, UA: NEGATIVE
Ketones, UA: NEGATIVE
Leukocytes,UA: NEGATIVE
Nitrite, UA: NEGATIVE
RBC, UA: NEGATIVE
Specific Gravity, UA: 1.015 (ref 1.005–1.030)
Urobilinogen, Ur: 1 mg/dL (ref 0.2–1.0)
pH, UA: 6.5 (ref 5.0–7.5)

## 2023-01-10 NOTE — Progress Notes (Signed)
Assessment: 1. Elevated PSA   2. BPH without obstruction/lower urinary tract symptoms     Plan: I personally reviewed the patient's chart including provider notes, and lab results. Today I had a long discussion with the patient regarding PSA and the rationale and controversies of prostate cancer early detection.  I discussed the pros and cons of further evaluation including TRUS and prostate Bx.  Potential adverse events and complications as well as standard instructions were given.  Patient expressed his understanding of these issues. I will need to discuss management of his chronic anticoagulation prior to consideration for prostate biopsy. ExoDx sent today. Consider further evaluation with a prostate MRI prior to biopsy. Will contact him with results.  Chief Complaint:  Chief Complaint  Patient presents with   Elevated PSA         History of Present Illness:  Rick Edwards is a 67 y.o. male who is seen in consultation from Etta Grandchild, MD for evaluation of elevated PSA. PSA results: 9/20 5.54 4/21 1.5 7/22 2.08 6/23 2.99 5/24 4.79  No prior urologic evaluation.  No history of prostate biopsy. No history of UTI's or prostatitis. No family history of prostate cancer. He has mild LUTS with occasional frequency, sensation of incomplete emptying, and nocturia x 2.  No dysuria or gross hematuria. IPSS = 9 today.  He is on chronic anticoagulation with warfarin due to antithrombin III deficiency and history of DVT.  Past Medical History:  Past Medical History:  Diagnosis Date   Anemia    anemia thrombocytopenia, saw Hematology 2011, can not r/o myeloproliferative d/c   Antithrombin III deficiency (HCC) 05/16/2013   On coumadin for this   Blood clot in vein    in right leg   Chronic kidney disease 05/27/2013   ACUTE RENAL FAILURE    Crohn's disease (HCC)    tx. Imuran   GI bleed 6/11   w/ normal EGD 6/11, 3 PRBCs    Hearing loss    wears hearing aid left  ear; birthed with hearing loss   HOH (hard of hearing) 05/16/2013   Hypercalcemia    Hyperkalemia 05/27/2013   Hypertension    Ileostomy in place [facility]) 04-11-13   04-18-13 ileostomy to be taken down.   Perianal abscess 2001   s/p right hemicolectomy, and drainage of retroperitoneal abcess 2003   Peripheral vascular disease (HCC)    Transfusion history    last 8'13    Past Surgical History:  Past Surgical History:  Procedure Laterality Date   Anal fistulotomy  2002   EMBOLECTOMY  03/12/2012   Procedure: EMBOLECTOMY;  Surgeon: Chuck Hint, MD;  Location: [facility] OR;  Service: Vascular;  Laterality: Right;  Right popliteal embolectomy with vein patch angioplasty, right posterior tibial embolectomy with vein patch angioplasty    HEMICOLECTOMY  08/29/2001   perforated abscess   ILEOSTOMY CLOSURE N/A 04/18/2013   Procedure: ILEOSTOMY TAKEDOWN;  Surgeon: Mariella Saa, MD;  Location: [facility] ORS;  Service: General;  Laterality: N/A;   INTRAOPERATIVE ARTERIOGRAM  03/12/2012   Procedure: INTRA OPERATIVE ARTERIOGRAM;  Surgeon: Chuck Hint, MD;  Location: [facility] OR;  Service: Vascular;  Laterality: Right;   PARTIAL COLECTOMY  03/11/2012   Procedure: PARTIAL COLECTOMY;  Surgeon: Mariella Saa, MD;  Location: [facility] ORS;  Service: General;  Laterality: N/A;  subtotal colectomy transverse and left colon    TRANSMETATARSAL AMPUTATION  05/10/2012   right    Allergies:  Allergies  Allergen Reactions  Mesalamine Rash    REACTION: Rash    Family History:  Family History  Problem Relation Age of Onset   Hypertension Mother    Hyperlipidemia Mother    Hypertension Father    Hyperlipidemia Father    Heart disease Father    Stroke Other        GF, aunts    Hypertension Other        "the whole family"   Coronary artery disease Neg Hx    Diabetes Neg Hx    Colon cancer Neg Hx    Prostate cancer Neg Hx    Kidney failure Neg Hx     Social History:  Social History   Tobacco Use    Smoking status: Former    Packs/day: 0.25    Years: 9.00    Additional pack years: 0.00    Total pack years: 2.25    Types: Cigarettes    Quit date: 05/09/2011    Years since quitting: 11.6   Smokeless tobacco: Never  Substance Use Topics   Alcohol use: No    Alcohol/week: 0.0 standard drinks of alcohol   Drug use: Yes    Types: Marijuana    Review of symptoms:  Constitutional:  Negative for unexplained weight loss, night sweats, fever, chills ENT:  Negative for nose bleeds, sinus pain, painful swallowing CV:  Negative for chest pain, shortness of breath, exercise intolerance, palpitations, loss of consciousness Resp:  Negative for cough, wheezing, shortness of breath GI:  Negative for nausea, vomiting, diarrhea, bloody stools GU:  Positives noted in HPI; otherwise negative for gross hematuria, dysuria, urinary incontinence Neuro:  Negative for seizures, poor balance, limb weakness, slurred speech Psych:  Negative for lack of energy, depression, anxiety Endocrine:  Negative for polydipsia, polyuria, symptoms of hypoglycemia (dizziness, hunger, sweating) Hematologic:  Negative for anemia, purpura, petechia, prolonged or excessive bleeding, use of anticoagulants  Allergic:  Negative for difficulty breathing or choking as a result of exposure to anything; no shellfish allergy; no allergic response (rash/itch) to materials, foods  Physical exam: BP 136/86   Pulse 64   Ht 5\' 9"  (1.753 m)   Wt 209 lb (94.8 kg)   BMI 30.86 kg/m  GENERAL APPEARANCE:  Well appearing, well developed, well nourished, NAD HEENT: Atraumatic, Normocephalic, oropharynx clear. NECK: Supple without lymphadenopathy or thyromegaly. LUNGS: Clear to auscultation bilaterally. HEART: Regular Rate and Rhythm without murmurs, gallops, or rubs. ABDOMEN: Soft, non-tender, No Masses. EXTREMITIES: Moves all extremities well.  Without clubbing, cyanosis, or edema. NEUROLOGIC:  Alert and oriented x 3, normal gait, CN  II-XII grossly intact.  MENTAL STATUS:  Appropriate. BACK:  Non-tender to palpation.  No CVAT SKIN:  Warm, dry and intact.   GU: Penis:  uncircumcised Meatus: Normal Scrotum: normal, no masses Testis: normal without masses bilateral Epididymis: normal Prostate: 40 g, NT, no nodules Rectum: Normal tone,  no masses or tenderness   Results: U/A:  0-5 WBC, 0 RBC, few bacteria

## 2023-01-15 ENCOUNTER — Encounter: Payer: Self-pay | Admitting: Urology

## 2023-01-24 ENCOUNTER — Other Ambulatory Visit: Payer: Self-pay | Admitting: Internal Medicine

## 2023-01-24 DIAGNOSIS — M1A39X0 Chronic gout due to renal impairment, multiple sites, without tophus (tophi): Secondary | ICD-10-CM

## 2023-01-25 ENCOUNTER — Other Ambulatory Visit: Payer: Self-pay | Admitting: Internal Medicine

## 2023-01-25 DIAGNOSIS — E876 Hypokalemia: Secondary | ICD-10-CM

## 2023-01-25 DIAGNOSIS — I10 Essential (primary) hypertension: Secondary | ICD-10-CM

## 2023-01-26 ENCOUNTER — Telehealth: Payer: Self-pay | Admitting: Urology

## 2023-01-26 NOTE — Telephone Encounter (Signed)
Returning Dr Lesia Sago Call

## 2023-01-29 ENCOUNTER — Telehealth: Payer: Self-pay | Admitting: Urology

## 2023-01-29 ENCOUNTER — Other Ambulatory Visit: Payer: Self-pay | Admitting: Urology

## 2023-01-29 DIAGNOSIS — R972 Elevated prostate specific antigen [PSA]: Secondary | ICD-10-CM

## 2023-01-29 NOTE — Telephone Encounter (Signed)
Patient calling back wanting to know results.

## 2023-01-31 ENCOUNTER — Other Ambulatory Visit: Payer: Self-pay | Admitting: Internal Medicine

## 2023-01-31 DIAGNOSIS — E781 Pure hyperglyceridemia: Secondary | ICD-10-CM

## 2023-02-01 ENCOUNTER — Other Ambulatory Visit: Payer: Self-pay | Admitting: Internal Medicine

## 2023-02-01 DIAGNOSIS — I10 Essential (primary) hypertension: Secondary | ICD-10-CM

## 2023-02-02 ENCOUNTER — Other Ambulatory Visit: Payer: Self-pay | Admitting: Internal Medicine

## 2023-02-02 DIAGNOSIS — E781 Pure hyperglyceridemia: Secondary | ICD-10-CM

## 2023-02-02 MED ORDER — VASCEPA 1 G PO CAPS
2.0000 g | ORAL_CAPSULE | Freq: Two times a day (BID) | ORAL | 1 refills | Status: DC
Start: 2023-02-02 — End: 2023-11-05

## 2023-02-06 ENCOUNTER — Ambulatory Visit (INDEPENDENT_AMBULATORY_CARE_PROVIDER_SITE_OTHER): Payer: Medicare Other

## 2023-02-06 DIAGNOSIS — Z7901 Long term (current) use of anticoagulants: Secondary | ICD-10-CM | POA: Diagnosis not present

## 2023-02-06 LAB — POCT INR: INR: 2.1 (ref 2.0–3.0)

## 2023-02-06 NOTE — Patient Instructions (Addendum)
Pre visit review using our clinic review tool, if applicable. No additional management support is needed unless otherwise documented below in the visit note.  Continue 1 tablet daily except take 1 1/2 tablets on Mondays, Wednesdays, and Fridays. Recheck in 6 weeks. 

## 2023-02-06 NOTE — Progress Notes (Signed)
Continue 1 tablet daily except take 1 1/2 tablets on Mondays, Wednesdays, and Fridays. Recheck in 6 weeks.   

## 2023-02-19 DIAGNOSIS — I10 Essential (primary) hypertension: Secondary | ICD-10-CM | POA: Diagnosis not present

## 2023-02-20 LAB — LAB REPORT - SCANNED
Albumin, Urine POC: 59.8
Creatinine, Urine.: 193.7
EGFR: 73
Microalb Creat Ratio: 31

## 2023-02-21 ENCOUNTER — Encounter: Payer: Self-pay | Admitting: Urology

## 2023-02-22 ENCOUNTER — Encounter: Payer: Self-pay | Admitting: Urology

## 2023-02-26 DIAGNOSIS — H2513 Age-related nuclear cataract, bilateral: Secondary | ICD-10-CM | POA: Diagnosis not present

## 2023-02-26 LAB — HM DIABETES EYE EXAM

## 2023-03-09 ENCOUNTER — Encounter: Payer: Self-pay | Admitting: Urology

## 2023-03-12 ENCOUNTER — Other Ambulatory Visit: Payer: Self-pay | Admitting: Internal Medicine

## 2023-03-12 DIAGNOSIS — I10 Essential (primary) hypertension: Secondary | ICD-10-CM

## 2023-03-13 ENCOUNTER — Encounter: Payer: Self-pay | Admitting: Urology

## 2023-03-15 ENCOUNTER — Inpatient Hospital Stay: Admission: RE | Admit: 2023-03-15 | Payer: Medicare Other | Source: Ambulatory Visit

## 2023-03-20 ENCOUNTER — Ambulatory Visit (INDEPENDENT_AMBULATORY_CARE_PROVIDER_SITE_OTHER): Payer: Medicare Other

## 2023-03-20 DIAGNOSIS — Z7901 Long term (current) use of anticoagulants: Secondary | ICD-10-CM

## 2023-03-20 LAB — POCT INR: INR: 2.9 (ref 2.0–3.0)

## 2023-03-20 NOTE — Patient Instructions (Addendum)
Pre visit review using our clinic review tool, if applicable. No additional management support is needed unless otherwise documented below in the visit note.  Continue 1 tablet daily except take 1 1/2 tablets on Mondays, Wednesdays, and Fridays. Recheck in 6 weeks. 

## 2023-03-20 NOTE — Progress Notes (Signed)
Continue 1 tablet daily except take 1 1/2 tablets on Mondays, Wednesdays, and Fridays. Recheck in 6 weeks.   

## 2023-04-01 ENCOUNTER — Other Ambulatory Visit: Payer: Self-pay | Admitting: Internal Medicine

## 2023-04-01 DIAGNOSIS — I10 Essential (primary) hypertension: Secondary | ICD-10-CM

## 2023-04-01 DIAGNOSIS — E876 Hypokalemia: Secondary | ICD-10-CM

## 2023-04-22 ENCOUNTER — Ambulatory Visit
Admission: RE | Admit: 2023-04-22 | Discharge: 2023-04-22 | Disposition: A | Discharge status: Home / Self Care | Payer: Medicare Other | Source: Ambulatory Visit | Attending: Urology | Admitting: Urology

## 2023-04-22 DIAGNOSIS — R972 Elevated prostate specific antigen [PSA]: Secondary | ICD-10-CM

## 2023-04-22 MED ORDER — GADOPICLENOL 0.5 MMOL/ML IV SOLN
7.0000 mL | Freq: Once | INTRAVENOUS | Status: AC | PRN
  Administered 2023-04-22: 7 mL via INTRAVENOUS

## 2023-05-01 ENCOUNTER — Ambulatory Visit: Payer: Medicare Other

## 2023-05-04 ENCOUNTER — Ambulatory Visit: Payer: Medicare Other

## 2023-05-07 ENCOUNTER — Telehealth: Payer: Self-pay | Admitting: Urology

## 2023-05-07 NOTE — Telephone Encounter (Signed)
Patient says he received a phone call phone Dr Pete Glatter. I did not see anything in chart. But thought I would let you know just in case you did call.

## 2023-05-08 ENCOUNTER — Ambulatory Visit (INDEPENDENT_AMBULATORY_CARE_PROVIDER_SITE_OTHER): Payer: Medicare Other

## 2023-05-08 ENCOUNTER — Other Ambulatory Visit: Payer: Self-pay | Admitting: Urology

## 2023-05-08 DIAGNOSIS — R972 Elevated prostate specific antigen [PSA]: Secondary | ICD-10-CM

## 2023-05-08 DIAGNOSIS — Z23 Encounter for immunization: Secondary | ICD-10-CM | POA: Diagnosis not present

## 2023-05-08 DIAGNOSIS — Z7901 Long term (current) use of anticoagulants: Secondary | ICD-10-CM | POA: Diagnosis not present

## 2023-05-08 LAB — POCT INR: INR: 2.2 (ref 2.0–3.0)

## 2023-05-08 NOTE — Progress Notes (Signed)
Continue 1 tablet daily except take 1 1/2 tablets on Mondays, Wednesdays, and Fridays. Recheck in 5 weeks.    Pt requested flu vaccine. Administered flu vaccine in L deltoid. Pt tolerated well.

## 2023-05-08 NOTE — Patient Instructions (Addendum)
Pre visit review using our clinic review tool, if applicable. No additional management support is needed unless otherwise documented below in the visit note.  Continue 1 tablet daily except take 1 1/2 tablets on Mondays, Wednesdays, and Fridays. Recheck in 5 weeks.

## 2023-05-14 ENCOUNTER — Telehealth: Payer: Self-pay

## 2023-05-14 NOTE — Telephone Encounter (Signed)
-----   Message from Tammy B sent at 05/14/2023 11:35 AM EDT ----- Regarding: NEED A CLEARANCE Patient needs a clearance to stop his Warfarin 3-5 days prior to a fusion BX please. Thanks, Marcelino Duster

## 2023-05-14 NOTE — Telephone Encounter (Signed)
Clearance sent to prescribing Dr, Dr Sanda Linger at [facility].

## 2023-05-16 NOTE — Telephone Encounter (Signed)
Received staff msg from Milderd Meager, MD, [facility], reporting pt will be scheduled for a prostate biopsy but biopsy has not been scheduled yet. He advised he will let the coumadin clinic know when it is scheduled.  Advised pt be placed on a Lovenox bridge. Will complete a lovenox bridge schedule once procedure is scheduled.

## 2023-05-18 NOTE — Telephone Encounter (Signed)
Received fax from [facility] at [facility] requesting clearance to stop warfarin 3-5 days before fusion biopsy.   Advised on the form that the coumadin clinic will need a date for the procedure to complete a Lovenox bridge schedule and pt's warfarin will be held for 5 days.   Form signed by PCP. Faxed form.

## 2023-06-08 ENCOUNTER — Other Ambulatory Visit: Payer: Self-pay | Admitting: Internal Medicine

## 2023-06-08 DIAGNOSIS — I10 Essential (primary) hypertension: Secondary | ICD-10-CM

## 2023-06-11 ENCOUNTER — Ambulatory Visit (INDEPENDENT_AMBULATORY_CARE_PROVIDER_SITE_OTHER): Payer: Medicare Other

## 2023-06-11 VITALS — Ht 69.0 in | Wt 209.0 lb

## 2023-06-11 DIAGNOSIS — Z0000 Encounter for general adult medical examination without abnormal findings: Secondary | ICD-10-CM | POA: Diagnosis not present

## 2023-06-11 DIAGNOSIS — Z1212 Encounter for screening for malignant neoplasm of rectum: Secondary | ICD-10-CM

## 2023-06-11 DIAGNOSIS — Z1211 Encounter for screening for malignant neoplasm of colon: Secondary | ICD-10-CM | POA: Diagnosis not present

## 2023-06-11 NOTE — Progress Notes (Signed)
Subjective:   Rick Edwards is a 67 y.o. male who presents for Medicare Annual/Subsequent preventive examination.  Visit Complete: Virtual I connected with  Rick Edwards on 06/11/23 by a audio enabled telemedicine application and verified that I am speaking with the correct person using two identifiers.  Patient Location: Home  Provider Location: Home Office  I discussed the limitations of evaluation and management by telemedicine. The patient expressed understanding and agreed to proceed.  Vital Signs: Because this visit was a virtual/telehealth visit, some criteria may be missing or patient reported. Any vitals not documented were not able to be obtained and vitals that have been documented are patient reported.   Cardiac Risk Factors include: hypertension;advanced age (>101men, >75 women);male gender;Other (see comment);diabetes mellitus;dyslipidemia, Risk factor comments: PVD, Cronh's disease, CKD, Atherosclerosis of aorta     Objective:    Today's Vitals   06/11/23 1258  Weight: 209 lb (94.8 kg)  Height: 5\' 9"  (1.753 m)   Body mass index is 30.86 kg/m.     06/11/2023    1:09 PM 05/08/2022    1:17 PM 09/24/2020    1:40 PM 08/12/2019    1:34 PM 01/19/2016    7:42 AM 03/31/2015    1:32 PM 05/27/2013    4:20 PM  Advanced Directives  Does Patient Have a Medical Advance Directive? Yes No Yes No No No Patient does not have advance directive  Type of Public librarian Power of [place];Living will  Living will;Healthcare Power of Attorney      Does patient want to make changes to medical advance directive?   No - Patient declined      Copy of Healthcare Power of Attorney in Chart? No - copy requested  No - copy requested      Would patient like information on creating a medical advance directive?  No - Patient declined  No - Patient declined No - patient declined information Yes - Educational materials given     Current Medications (verified) Outpatient  Encounter Medications as of 06/11/2023  Medication Sig   allopurinol (ZYLOPRIM) 300 MG tablet TAKE 1 TABLET BY MOUTH EVERY DAY   amLODipine (NORVASC) 10 MG tablet TAKE 1 TABLET BY MOUTH EVERYDAY AT BEDTIME   atorvastatin (LIPITOR) 40 MG tablet TAKE 1 TABLET BY MOUTH EVERY DAY   carvedilol (COREG) 25 MG tablet TAKE 1 TABLET (25 MG TOTAL) BY MOUTH TWICE A DAY WITH MEALS   hyoscyamine (LEVSIN SL) 0.125 MG SL tablet Place under the tongue 2 (two) times daily as needed.   potassium chloride (KLOR-CON) 10 MEQ tablet TAKE 1 TABLET BY MOUTH TWICE A DAY   spironolactone (ALDACTONE) 25 MG tablet TAKE 1 TABLET (25 MG TOTAL) BY MOUTH DAILY.   torsemide (DEMADEX) 20 MG tablet TAKE 1 TABLET BY MOUTH EVERY DAY   VASCEPA 1 g capsule Take 2 capsules (2 g total) by mouth 2 (two) times daily.   warfarin (COUMADIN) 5 MG tablet TAKE 1.5 TABLETS DAILY, EXCEPT TAKE 1 TABLET ON MON, WED, AND FRI AS DIRECTED BY CLINIC.   pantoprazole (PROTONIX) 40 MG tablet Take 40 mg by mouth daily. (Patient not taking: Reported on 06/11/2023)   No facility-administered encounter medications on file as of 06/11/2023.    Allergies (verified) Mesalamine   History: Past Medical History:  Diagnosis Date   Anemia    anemia thrombocytopenia, saw Hematology 2011, can not r/o myeloproliferative d/c   Antithrombin III deficiency (HCC) 05/16/2013   On coumadin for this  Blood clot in vein    in right leg   Chronic kidney disease 05/27/2013   ACUTE RENAL FAILURE    Crohn's disease (HCC)    tx. Imuran   GI bleed 6/11   w/ normal EGD 6/11, 3 PRBCs    Hearing loss    wears hearing aid left ear; birthed with hearing loss   HOH (hard of hearing) 05/16/2013   Hypercalcemia    Hyperkalemia 05/27/2013   Hypertension    Ileostomy in place [facility]) 04-11-13   04-18-13 ileostomy to be taken down.   Perianal abscess 2001   s/p right hemicolectomy, and drainage of retroperitoneal abcess 2003   Peripheral vascular disease (HCC)    Transfusion  history    last 8'13   Past Surgical History:  Procedure Laterality Date   Anal fistulotomy  2002   EMBOLECTOMY  03/12/2012   Procedure: EMBOLECTOMY;  Surgeon: Chuck Hint, MD;  Location: [facility] OR;  Service: Vascular;  Laterality: Right;  Right popliteal embolectomy with vein patch angioplasty, right posterior tibial embolectomy with vein patch angioplasty    HEMICOLECTOMY  08/29/2001   perforated abscess   ILEOSTOMY CLOSURE N/A 04/18/2013   Procedure: ILEOSTOMY TAKEDOWN;  Surgeon: Mariella Saa, MD;  Location: [facility] ORS;  Service: General;  Laterality: N/A;   INTRAOPERATIVE ARTERIOGRAM  03/12/2012   Procedure: INTRA OPERATIVE ARTERIOGRAM;  Surgeon: Chuck Hint, MD;  Location: [facility] OR;  Service: Vascular;  Laterality: Right;   PARTIAL COLECTOMY  03/11/2012   Procedure: PARTIAL COLECTOMY;  Surgeon: Mariella Saa, MD;  Location: [facility] ORS;  Service: General;  Laterality: N/A;  subtotal colectomy transverse and left colon    TRANSMETATARSAL AMPUTATION  05/10/2012   right   Family History  Problem Relation Age of Onset   Hypertension Mother    Hyperlipidemia Mother    Hypertension Father    Hyperlipidemia Father    Heart disease Father    Stroke Other        GF, aunts    Hypertension Other        "the whole family"   Coronary artery disease Neg Hx    Diabetes Neg Hx    Colon cancer Neg Hx    Prostate cancer Neg Hx    Kidney failure Neg Hx    Social History   Socioeconomic History   Marital status: Single    Spouse name: Not on file   Number of children: Not on file   Years of education: Not on file   Highest education level: Not on file  Occupational History   Occupation: Post office    Occupation: disablity  Tobacco Use   Smoking status: Former    Current packs/day: 0.00    Average packs/day: 0.3 packs/day for 9.0 years (2.3 ttl pk-yrs)    Types: Cigarettes    Start date: 05/08/2002    Quit date: 05/09/2011    Years since quitting: 12.0   Smokeless  tobacco: Never  Vaping Use   Vaping status: Never Used  Substance and Sexual Activity   Alcohol use: No    Alcohol/week: 0.0 standard drinks of alcohol   Drug use: Yes    Types: Marijuana   Sexual activity: Not Currently  Other Topics Concern   Not on file  Social History Narrative   Lives by himself/ Regular exercise- no    Social Determinants of Health   Financial Resource Strain: Low Risk  (06/11/2023)   Overall Financial Resource Strain (CARDIA)    Difficulty  of Paying Living Expenses: Not hard at all  Food Insecurity: No Food Insecurity (06/11/2023)   Hunger Vital Sign    Worried About Running Out of Food in the Last Year: Never true    Ran Out of Food in the Last Year: Never true  Transportation Needs: No Transportation Needs (06/11/2023)   PRAPARE - Administrator, Civil Service (Medical): No    Lack of Transportation (Non-Medical): No  Physical Activity: Inactive (06/11/2023)   Exercise Vital Sign    Days of Exercise per Week: 0 days    Minutes of Exercise per Session: 0 min  Stress: No Stress Concern Present (06/11/2023)   [employer] of Occupational Health - Occupational Stress Questionnaire    Feeling of Stress : Not at all  Social Connections: Moderately Isolated (06/11/2023)   Social Connection and Isolation Panel [NHANES]    Frequency of Communication with Friends and Family: More than three times a week    Frequency of Social Gatherings with Friends and Family: Never    Attends Religious Services: More than 4 times per year    Active Member of [employer] or Organizations: No    Attends Engineer, structural: Never    Marital Status: Never married    Tobacco Counseling Counseling given: Not Answered   Clinical Intake:  Pre-visit preparation completed: Yes  Pain : No/denies pain     BMI - recorded: 30.86 Nutritional Risks: None Diabetes: Yes CBG done?: No Did pt. bring in CBG monitor from home?: No  How often do you need to have  someone help you when you read instructions, pamphlets, or other written materials from your doctor or pharmacy?: 1 - Never  Interpreter Needed?: No  Information entered by :: Lemya Greenwell, RMA   Activities of Daily Living    06/11/2023    1:05 PM  In your present state of health, do you have any difficulty performing the following activities:  Hearing? 0  Vision? 0  Difficulty concentrating or making decisions? 0  Walking or climbing stairs? 0  Dressing or bathing? 0  Doing errands, shopping? 0  Preparing Food and eating ? N  Using the Toilet? N  In the past six months, have you accidently leaked urine? N  Do you have problems with loss of bowel control? N  Managing your Medications? N  Managing your Finances? N  Housekeeping or managing your Housekeeping? N    Patient Care Team: Etta Grandchild, MD as PCP - General (Internal Medicine) Glenna Fellows, MD (Inactive) (General Surgery) Karie Soda, MD as Consulting Physician (General Surgery) [facility] (Ophthalmology)  Indicate any recent Medical Services you may have received from other than [facility] providers in the past year (date may be approximate).     Assessment:   This is a routine wellness examination for [place].  Hearing/Vision screen Hearing Screening - Comments:: Wears hearing aides Vision Screening - Comments:: Denies vision issues   Goals Addressed             This Visit's Progress    Patient Stated   On track    To maintain my current health status by continuing to eat healthy, stay physically active and socially active.      Depression Screen    06/11/2023    1:18 PM 05/08/2022    1:15 PM 01/12/2022    2:15 PM 03/17/2021    2:00 PM 01/18/2021   10:45 AM 09/24/2020    1:37 PM 05/05/2019  3:56 PM  PHQ 2/9 Scores  PHQ - 2 Score 0 0 0 0 0 0 0  PHQ- 9 Score 0          Fall Risk    06/11/2023    1:09 PM 05/08/2022    1:14 PM 03/07/2022    1:25 PM 01/12/2022    2:15 PM 10/18/2021     2:15 PM  Fall Risk   Falls in the past year? 0 0 0 0 0  Comment   continues to deny falls x last 12 months- does not use assistive devices continues to deny falls x 12 months; does not use assistive devices denies new/ recent falls x 12 months; does not use assistive devices  Number falls in past yr:  0 0 0 0  Injury with Fall?  0 0 0 0  Comment   N/A- no falls reported N/A- no falls reported   Risk for fall due to :  No Fall Risks No Fall Risks No Fall Risks No Fall Risks  Follow up Falls evaluation completed;Falls prevention discussed Falls prevention discussed Falls prevention discussed Falls prevention discussed Falls prevention discussed    MEDICARE RISK AT HOME: Medicare Risk at Home Any stairs in or around the home?: No Home free of loose throw rugs in walkways, pet beds, electrical cords, etc?: Yes Adequate lighting in your home to reduce risk of falls?: Yes Life alert?: No Use of a cane, walker or w/c?: No Grab bars in the bathroom?: No Shower chair or bench in shower?: No Elevated toilet seat or a handicapped toilet?: No  TIMED UP AND GO:  Was the test performed?  No    Cognitive Function:        06/11/2023    1:12 PM 05/08/2022    1:18 PM  6CIT Screen  What Year? 0 points 0 points  What month? 0 points 0 points  What time? 0 points 0 points  Count back from 20 0 points 0 points  Months in reverse 4 points 0 points  Repeat phrase 2 points 0 points  Total Score 6 points 0 points    Immunizations Immunization History  Administered Date(s) Administered   Influenza Split 05/29/2011, 07/17/2012   Influenza Whole 06/29/2009   Influenza, Seasonal, Injecte, Preservative Fre 05/08/2023   Influenza,inj,Quad PF,6+ Mos 05/19/2013, 06/03/2014, 05/18/2015, 04/05/2016, 05/23/2017, 04/22/2018, 05/05/2019, 06/01/2020, 05/24/2021, 04/21/2022   Moderna Covid-19 Fall Seasonal Vaccine 76yrs & older 05/24/2023   PFIZER(Purple Top)SARS-COV-2 Vaccination 10/10/2019, 11/20/2019    Pneumococcal Conjugate-13 01/18/2016   Pneumococcal Polysaccharide-23 05/19/2013, 08/15/2018   Respiratory Syncytial Virus Vaccine,Recomb Aduvanted(Arexvy) 05/24/2022   Td 06/29/2009   Tdap 08/15/2018   Zoster Recombinant(Shingrix) 04/25/2021    TDAP status: Up to date  Flu Vaccine status: Up to date  Pneumococcal vaccine status: Up to date  Covid-19 vaccine status: Completed vaccines  Qualifies for Shingles Vaccine? Yes   Zostavax completed Yes   Shingrix Completed?: No.    Education has been provided regarding the importance of this vaccine. Patient has been advised to call insurance company to determine out of pocket expense if they have not yet received this vaccine. Advised may also receive vaccine at local pharmacy or Health Dept. Verbalized acceptance and understanding.  Screening Tests Health Maintenance  Topic Date Due   Zoster Vaccines- Shingrix (2 of 2) 06/20/2021   OPHTHALMOLOGY EXAM  08/10/2022   Colonoscopy  02/15/2023   FOOT EXAM  05/10/2023   HEMOGLOBIN A1C  06/28/2023   Diabetic kidney evaluation -  eGFR measurement  02/20/2024   Diabetic kidney evaluation - Urine ACR  02/20/2024   Medicare Annual Wellness (AWV)  06/10/2024   DTaP/Tdap/Td (3 - Td or Tdap) 08/15/2028   INFLUENZA VACCINE  Completed   COVID-19 Vaccine  Completed   Hepatitis C Screening  Completed   HIV Screening  Completed   HPV VACCINES  Aged Out    Health Maintenance  Health Maintenance Due  Topic Date Due   Zoster Vaccines- Shingrix (2 of 2) 06/20/2021   OPHTHALMOLOGY EXAM  08/10/2022   Colonoscopy  02/15/2023   FOOT EXAM  05/10/2023    Colorectal cancer screening: Referral to GI placed 06/11/2023. Pt aware the office will call re: appt.  Lung Cancer Screening: (Low Dose CT Chest recommended if Age 51-80 years, 20 pack-year currently smoking OR have quit w/in 15years.) does not qualify.   Lung Cancer Screening Referral: N/A  Additional Screening:  Hepatitis C Screening: does  qualify; Completed 01/18/2016  Vision Screening: Recommended annual ophthalmology exams for early detection of glaucoma and other disorders of the eye. Is the patient up to date with their annual eye exam?  Yes  Who is the provider or what is the name of the office in which the patient attends annual eye exams? [facility] If pt is not established with a provider, would they like to be referred to a provider to establish care? No .   Dental Screening: Recommended annual dental exams for proper oral hygiene  Diabetic Foot Exam: Diabetic Foot Exam: Completed 05/09/2022  Community Resource Referral / Chronic Care Management: CRR required this visit?  No   CCM required this visit?  No     Plan:     I have personally reviewed and noted the following in the patient's chart:   Medical and social history Use of alcohol, tobacco or illicit drugs  Current medications and supplements including opioid prescriptions. Patient is not currently taking opioid prescriptions. Functional ability and status Nutritional status Physical activity Advanced directives List of other physicians Hospitalizations, surgeries, and ER visits in previous 12 months Vitals Screenings to include cognitive, depression, and falls Referrals and appointments  In addition, I have reviewed and discussed with patient certain preventive protocols, quality metrics, and best practice recommendations. A written personalized care plan for preventive services as well as general preventive health recommendations were provided to patient.     Anyae Griffith L Saahil Herbster, CMA   06/11/2023   After Visit Summary: (Mail) Due to this being a telephonic visit, the after visit summary with patients personalized plan was offered to patient via mail   Nurse Notes: Patient is due for a colonoscopy and he stated that he has had a eye exam for this year.  I have sent a letter to eye doctor requesting records.  I placed a referral for a  colonoscopy today as well.  Patient also is due for a 2nd Shingles vaccine.  He is due for a foot exam.  Patient has an appointment tomorrow with Dr. Yetta Barre.  He had no other concerns to address today.

## 2023-06-11 NOTE — Patient Instructions (Addendum)
Rick Edwards , Thank you for taking time to come for your Medicare Wellness Visit. I appreciate your ongoing commitment to your health goals. Please review the following plan we discussed and let me know if I can assist you in the future.   Referrals/Orders/Follow-Ups/Clinician Recommendations: You are due for a colonoscopy and a foot exam for this year.  Please call to schedule a colonoscopy at [facility]/  [address], [place], [place]/  [phone].  Aim for 30 minutes of exercise or brisk walking, 6-8 glasses of water, and 5 servings of fruits and vegetables each day.   This is a list of the screening recommended for you and due dates:  Health Maintenance  Topic Date Due   Zoster (Shingles) Vaccine (2 of 2) 06/20/2021   Eye exam for diabetics  08/10/2022   Colon Cancer Screening  02/15/2023   Complete foot exam   05/10/2023   Hemoglobin A1C  06/28/2023   Yearly kidney function blood test for diabetes  02/20/2024   Yearly kidney health urinalysis for diabetes  02/20/2024   Medicare Annual Wellness Visit  06/10/2024   DTaP/Tdap/Td vaccine (3 - Td or Tdap) 08/15/2028   Flu Shot  Completed   COVID-19 Vaccine  Completed   Hepatitis C Screening  Completed   HIV Screening  Completed   HPV Vaccine  Aged Out    Advanced directives: (Copy Requested) Please bring a copy of your health care power of attorney and living will to the office to be added to your chart at your convenience.  Next Medicare Annual Wellness Visit scheduled for next year: Yes

## 2023-06-12 ENCOUNTER — Encounter: Payer: Self-pay | Admitting: Internal Medicine

## 2023-06-12 ENCOUNTER — Ambulatory Visit (INDEPENDENT_AMBULATORY_CARE_PROVIDER_SITE_OTHER): Payer: Medicare Other

## 2023-06-12 ENCOUNTER — Ambulatory Visit (INDEPENDENT_AMBULATORY_CARE_PROVIDER_SITE_OTHER): Payer: Medicare Other | Admitting: Internal Medicine

## 2023-06-12 VITALS — BP 132/80 | HR 63 | Temp 98.4°F | Resp 16 | Ht 69.0 in | Wt 205.0 lb

## 2023-06-12 DIAGNOSIS — Z7901 Long term (current) use of anticoagulants: Secondary | ICD-10-CM

## 2023-06-12 DIAGNOSIS — R808 Other proteinuria: Secondary | ICD-10-CM | POA: Diagnosis not present

## 2023-06-12 DIAGNOSIS — B356 Tinea cruris: Secondary | ICD-10-CM

## 2023-06-12 DIAGNOSIS — L2084 Intrinsic (allergic) eczema: Secondary | ICD-10-CM | POA: Diagnosis not present

## 2023-06-12 DIAGNOSIS — E118 Type 2 diabetes mellitus with unspecified complications: Secondary | ICD-10-CM | POA: Diagnosis not present

## 2023-06-12 DIAGNOSIS — R809 Proteinuria, unspecified: Secondary | ICD-10-CM | POA: Diagnosis not present

## 2023-06-12 DIAGNOSIS — E1129 Type 2 diabetes mellitus with other diabetic kidney complication: Secondary | ICD-10-CM

## 2023-06-12 DIAGNOSIS — Z23 Encounter for immunization: Secondary | ICD-10-CM | POA: Insufficient documentation

## 2023-06-12 DIAGNOSIS — I10 Essential (primary) hypertension: Secondary | ICD-10-CM | POA: Diagnosis not present

## 2023-06-12 DIAGNOSIS — I700 Atherosclerosis of aorta: Secondary | ICD-10-CM

## 2023-06-12 DIAGNOSIS — K5010 Crohn's disease of large intestine without complications: Secondary | ICD-10-CM | POA: Diagnosis not present

## 2023-06-12 LAB — BASIC METABOLIC PANEL
BUN: 15 mg/dL (ref 6–23)
CO2: 30 meq/L (ref 19–32)
Calcium: 9.6 mg/dL (ref 8.4–10.5)
Chloride: 100 meq/L (ref 96–112)
Creatinine, Ser: 1.09 mg/dL (ref 0.40–1.50)
GFR: 71.48 mL/min (ref >60.00)
Glucose, Bld: 106 mg/dL — ABNORMAL HIGH (ref 70–99)
Potassium: 4.5 meq/L (ref 3.5–5.1)
Sodium: 137 meq/L (ref 135–145)

## 2023-06-12 LAB — CBC WITH DIFFERENTIAL/PLATELET
Basophils Absolute: 0.1 10*3/uL (ref 0.0–0.1)
Basophils Relative: 1.5 % (ref 0.0–3.0)
Eosinophils Absolute: 0.2 10*3/uL (ref 0.0–0.7)
Eosinophils Relative: 3.3 % (ref 0.0–5.0)
HCT: 45.8 % (ref 39.0–52.0)
Hemoglobin: 14.9 g/dL (ref 13.0–17.0)
Lymphocytes Relative: 29.7 % (ref 12.0–46.0)
Lymphs Abs: 1.9 10*3/uL (ref 0.7–4.0)
MCHC: 32.5 g/dL (ref 30.0–36.0)
MCV: 93.8 fL (ref 78.0–100.0)
Monocytes Absolute: 0.6 10*3/uL (ref 0.1–1.0)
Monocytes Relative: 9.9 % (ref 3.0–12.0)
Neutro Abs: 3.6 10*3/uL (ref 1.4–7.7)
Neutrophils Relative %: 55.6 % (ref 43.0–77.0)
Platelets: 383 10*3/uL (ref 150.0–400.0)
RBC: 4.88 Mil/uL (ref 4.22–5.81)
RDW: 15.7 % — ABNORMAL HIGH (ref 11.5–15.5)
WBC: 6.5 10*3/uL (ref 4.0–10.5)

## 2023-06-12 LAB — URINALYSIS, ROUTINE W REFLEX MICROSCOPIC
Bilirubin Urine: NEGATIVE
Hgb urine dipstick: NEGATIVE
Ketones, ur: NEGATIVE
Leukocytes,Ua: NEGATIVE
Nitrite: NEGATIVE
RBC / HPF: NONE SEEN (ref 0–?)
Specific Gravity, Urine: 1.015 (ref 1.000–1.030)
Total Protein, Urine: 30 — AB
Urine Glucose: NEGATIVE
Urobilinogen, UA: 0.2 (ref 0.0–1.0)
pH: 6 (ref 5.0–8.0)

## 2023-06-12 LAB — MICROALBUMIN / CREATININE URINE RATIO
Creatinine,U: 197.1 mg/dL
Microalb Creat Ratio: 8.1 mg/g (ref 0.0–30.0)
Microalb, Ur: 16 mg/dL — ABNORMAL HIGH (ref 0.0–1.9)

## 2023-06-12 LAB — POCT INR: INR: 3.3 — AB (ref 2.0–3.0)

## 2023-06-12 LAB — HEMOGLOBIN A1C: Hgb A1c MFr Bld: 5.9 % (ref 4.6–6.5)

## 2023-06-12 MED ORDER — TRIAMCINOLONE ACETONIDE 0.5 % EX CREA: 1.0000 | TOPICAL_CREAM | Freq: Three times a day (TID) | CUTANEOUS | 2 refills | Status: DC

## 2023-06-12 MED ORDER — CICLOPIROX OLAMINE 0.77 % EX CREA: TOPICAL_CREAM | Freq: Two times a day (BID) | CUTANEOUS | 2 refills | Status: DC

## 2023-06-12 MED ORDER — SHINGRIX 50 MCG/0.5ML IM SUSR
0.5000 mL | Freq: Once | INTRAMUSCULAR | 0 refills | Status: AC
Start: 2023-06-12 — End: 2023-06-12

## 2023-06-12 NOTE — Patient Instructions (Addendum)
Pre visit review using our clinic review tool, if applicable. No additional management support is needed unless otherwise documented below in the visit note.  Hold dose today and then continue 1 tablet daily except take 1 1/2 tablets on Mondays, Wednesdays, and Fridays. Recheck in 3 weeks.

## 2023-06-12 NOTE — Progress Notes (Signed)
Pt is to have prostate biopsy and colonoscopy but neither have been scheduled yet. Pt also has PCP OV today.  Pt denies any changes. Hold dose today and then continue 1 tablet daily except take 1 1/2 tablets on Mondays, Wednesdays, and Fridays. Recheck in 3 weeks.

## 2023-06-12 NOTE — Patient Instructions (Signed)
Hypertension, Adult High blood pressure (hypertension) is when the force of blood pumping through the arteries is too strong. The arteries are the blood vessels that carry blood from the heart throughout the body. Hypertension forces the heart to work harder to pump blood and may cause arteries to become narrow or stiff. Untreated or uncontrolled hypertension can lead to a heart attack, heart failure, a stroke, kidney disease, and other problems. A blood pressure reading consists of a higher number over a lower number. Ideally, your blood pressure should be below 120/80. The first ("top") number is called the systolic pressure. It is a measure of the pressure in your arteries as your heart beats. The second ("bottom") number is called the diastolic pressure. It is a measure of the pressure in your arteries as the heart relaxes. What are the causes? The exact cause of this condition is not known. There are some conditions that result in high blood pressure. What increases the risk? Certain factors may make you more likely to develop high blood pressure. Some of these risk factors are under your control, including: Smoking. Not getting enough exercise or physical activity. Being overweight. Having too much fat, sugar, calories, or salt (sodium) in your diet. Drinking too much alcohol. Other risk factors include: Having a personal history of heart disease, diabetes, high cholesterol, or kidney disease. Stress. Having a family history of high blood pressure and high cholesterol. Having obstructive sleep apnea. Age. The risk increases with age. What are the signs or symptoms? High blood pressure may not cause symptoms. Very high blood pressure (hypertensive crisis) may cause: Headache. Fast or irregular heartbeats (palpitations). Shortness of breath. Nosebleed. Nausea and vomiting. Vision changes. Severe chest pain, dizziness, and seizures. How is this diagnosed? This condition is diagnosed by  measuring your blood pressure while you are seated, with your arm resting on a flat surface, your legs uncrossed, and your feet flat on the floor. The cuff of the blood pressure monitor will be placed directly against the skin of your upper arm at the level of your heart. Blood pressure should be measured at least twice using the same arm. Certain conditions can cause a difference in blood pressure between your right and left arms. If you have a high blood pressure reading during one visit or you have normal blood pressure with other risk factors, you may be asked to: Return on a different day to have your blood pressure checked again. Monitor your blood pressure at home for 1 week or longer. If you are diagnosed with hypertension, you may have other blood or imaging tests to help your health care provider understand your overall risk for other conditions. How is this treated? This condition is treated by making healthy lifestyle changes, such as eating healthy foods, exercising more, and reducing your alcohol intake. You may be referred for counseling on a healthy diet and physical activity. Your health care provider may prescribe medicine if lifestyle changes are not enough to get your blood pressure under control and if: Your systolic blood pressure is above 130. Your diastolic blood pressure is above 80. Your personal target blood pressure may vary depending on your medical conditions, your age, and other factors. Follow these instructions at home: Eating and drinking  Eat a diet that is high in fiber and potassium, and low in sodium, added sugar, and fat. An example of this eating plan is called the DASH diet. DASH stands for Dietary Approaches to Stop Hypertension. To eat this way: Eat   plenty of fresh fruits and vegetables. Try to fill one half of your plate at each meal with fruits and vegetables. Eat whole grains, such as whole-wheat pasta, brown rice, or whole-grain bread. Fill about one  fourth of your plate with whole grains. Eat or drink low-fat dairy products, such as skim milk or low-fat yogurt. Avoid fatty cuts of meat, processed or cured meats, and poultry with skin. Fill about one fourth of your plate with lean proteins, such as fish, chicken without skin, beans, eggs, or tofu. Avoid pre-made and processed foods. These tend to be higher in sodium, added sugar, and fat. Reduce your daily sodium intake. Many people with hypertension should eat less than 1,500 mg of sodium a day. Do not drink alcohol if: Your health care provider tells you not to drink. You are pregnant, may be pregnant, or are planning to become pregnant. If you drink alcohol: Limit how much you have to: 0-1 drink a day for women. 0-2 drinks a day for men. Know how much alcohol is in your drink. In the [place] one drink equals one 12 oz bottle of beer (355 mL), one 5 oz glass of wine (148 mL), or one 1 oz glass of hard liquor (44 mL). Lifestyle  Work with your health care provider to maintain a healthy body weight or to lose weight. Ask what an ideal weight is for you. Get at least 30 minutes of exercise that causes your heart to beat faster (aerobic exercise) most days of the week. Activities may include walking, swimming, or biking. Include exercise to strengthen your muscles (resistance exercise), such as Pilates or lifting weights, as part of your weekly exercise routine. Try to do these types of exercises for 30 minutes at least 3 days a week. Do not use any products that contain nicotine or tobacco. These products include cigarettes, chewing tobacco, and vaping devices, such as e-cigarettes. If you need help quitting, ask your health care provider. Monitor your blood pressure at home as told by your health care provider. Keep all follow-up visits. This is important. Medicines Take over-the-counter and prescription medicines only as told by your health care provider. Follow directions carefully. Blood  pressure medicines must be taken as prescribed. Do not skip doses of blood pressure medicine. Doing this puts you at risk for problems and can make the medicine less effective. Ask your health care provider about side effects or reactions to medicines that you should watch for. Contact a health care provider if you: Think you are having a reaction to a medicine you are taking. Have headaches that keep coming back (recurring). Feel dizzy. Have swelling in your ankles. Have trouble with your vision. Get help right away if you: Develop a severe headache or confusion. Have unusual weakness or numbness. Feel faint. Have severe pain in your chest or abdomen. Vomit repeatedly. Have trouble breathing. These symptoms may be an emergency. Get help right away. Call 911. Do not wait to see if the symptoms will go away. Do not drive yourself to the hospital. Summary Hypertension is when the force of blood pumping through your arteries is too strong. If this condition is not controlled, it may put you at risk for serious complications. Your personal target blood pressure may vary depending on your medical conditions, your age, and other factors. For most people, a normal blood pressure is less than 120/80. Hypertension is treated with lifestyle changes, medicines, or a combination of both. Lifestyle changes include losing weight, eating a healthy,   low-sodium diet, exercising more, and limiting alcohol. This information is not intended to replace advice given to you by your health care provider. Make sure you discuss any questions you have with your health care provider. Document Revised: 05/31/2021 Document Reviewed: 05/31/2021 Elsevier Patient Education  2024 Elsevier Inc.  

## 2023-06-12 NOTE — Progress Notes (Unsigned)
Subjective:  Patient ID: Rick Edwards, male    DOB: [birth year]  Age: 67 y.o. MRN: [id number]  CC: Hypertension, Rash, and Diabetes   HPI [name] presents for f/up ---  He is active and denies DOE, CP, SOB, edema.  Outpatient Medications Prior to Visit  Medication Sig Dispense Refill   allopurinol (ZYLOPRIM) 300 MG tablet TAKE 1 TABLET BY MOUTH EVERY DAY 90 tablet 1   amLODipine (NORVASC) 10 MG tablet TAKE 1 TABLET BY MOUTH EVERYDAY AT BEDTIME 90 tablet 1   atorvastatin (LIPITOR) 40 MG tablet TAKE 1 TABLET BY MOUTH EVERY DAY 90 tablet 1   carvedilol (COREG) 25 MG tablet TAKE 1 TABLET (25 MG TOTAL) BY MOUTH TWICE A DAY WITH MEALS 180 tablet 1   hyoscyamine (LEVSIN SL) 0.125 MG SL tablet Place under the tongue 2 (two) times daily as needed.     pantoprazole (PROTONIX) 40 MG tablet Take 40 mg by mouth daily.     potassium chloride (KLOR-CON) 10 MEQ tablet TAKE 1 TABLET BY MOUTH TWICE A DAY 180 tablet 0   spironolactone (ALDACTONE) 25 MG tablet TAKE 1 TABLET (25 MG TOTAL) BY MOUTH DAILY. 90 tablet 1   torsemide (DEMADEX) 20 MG tablet TAKE 1 TABLET BY MOUTH EVERY DAY 90 tablet 0   VASCEPA 1 g capsule Take 2 capsules (2 g total) by mouth 2 (two) times daily. 360 capsule 1   warfarin (COUMADIN) 5 MG tablet TAKE 1.5 TABLETS DAILY, EXCEPT TAKE 1 TABLET ON MON, WED, AND FRI AS DIRECTED BY CLINIC. 108 tablet 1   No facility-administered medications prior to visit.    ROS Review of Systems  Constitutional:  Negative for diaphoresis, fatigue and unexpected weight change.  HENT:  Positive for hearing loss. Negative for ear pain.   Eyes: Negative.   Respiratory:  Negative for cough, chest tightness, shortness of breath and wheezing.   Cardiovascular:  Negative for chest pain, palpitations and leg swelling.  Gastrointestinal:  Negative for abdominal pain, constipation, diarrhea, nausea and vomiting.  Endocrine: Negative.   Genitourinary: Negative.  Negative for difficulty  urinating, scrotal swelling and testicular pain.  Musculoskeletal:  Negative for arthralgias, gait problem and myalgias.  Skin:  Positive for color change and rash.       Itchy groin rash for 2 weeks. He has not treated it.  Neurological:  Negative for dizziness.  Hematological:  Negative for adenopathy. Does not bruise/bleed easily.  Psychiatric/Behavioral:  Positive for confusion and decreased concentration.        Objective:  BP 132/80 (BP Location: Left Arm, Patient Position: Sitting, Cuff Size: Normal)   Pulse 63   Temp 98.4 F (36.9 C) (Oral)   Resp 16   Ht 5\' 9"  (1.753 m)   Wt 205 lb (93 kg)   SpO2 98%   BMI 30.27 kg/m   BP Readings from Last 3 Encounters:  06/12/23 132/80  01/10/23 136/86  12/26/22 122/78    Wt Readings from Last 3 Encounters:  06/12/23 205 lb (93 kg)  06/11/23 209 lb (94.8 kg)  01/10/23 209 lb (94.8 kg)    Physical Exam Vitals reviewed.  Constitutional:      Appearance: He is not ill-appearing.  HENT:     Mouth/Throat:     Mouth: Mucous membranes are moist.  Eyes:     General: No scleral icterus.    Conjunctiva/sclera: Conjunctivae normal.  Cardiovascular:     Rate and Rhythm: Normal rate and regular rhythm.  Heart sounds: Normal heart sounds, S1 normal and S2 normal. No murmur heard.    Comments: EKG--- NSR, 60 bpm LAFB No LVH, Q waves, or ST/T waves  Unchanged Pulmonary:     Effort: Pulmonary effort is normal.     Breath sounds: No stridor. No wheezing, rhonchi or rales.  Abdominal:     Palpations: There is no mass.     Tenderness: There is no abdominal tenderness. There is no guarding.     Hernia: No hernia is present. There is no hernia in the left inguinal area or right inguinal area.  Genitourinary:    Pubic Area: Rash present.     Penis: Normal and uncircumcised. No swelling.      Testes: Normal.     Epididymis:     Right: Normal.     Left: Normal.    Musculoskeletal:     Cervical back: Neck supple.     Right  lower leg: No edema.     Left lower leg: No edema.  Lymphadenopathy:     Lower Body: No right inguinal adenopathy. No left inguinal adenopathy.  Skin:    General: Skin is warm and dry.     Findings: Rash present. No erythema.  Neurological:     General: No focal deficit present.     Mental Status: He is alert. Mental status is at baseline.  Psychiatric:        Mood and Affect: Mood normal.        Behavior: Behavior normal.     Lab Results  Component Value Date   WBC 6.5 06/12/2023   HGB 14.9 06/12/2023   HCT 45.8 06/12/2023   PLT 383.0 06/12/2023   GLUCOSE 106 (H) 06/12/2023   CHOL 148 12/26/2022   TRIG 114.0 12/26/2022   HDL 34.50 (L) 12/26/2022   LDLDIRECT 97.0 05/05/2019   LDLCALC 91 12/26/2022   ALT 16 12/26/2022   AST 12 12/26/2022   NA 137 06/12/2023   K 4.5 06/12/2023   CL 100 06/12/2023   CREATININE 1.09 06/12/2023   BUN 15 06/12/2023   CO2 30 06/12/2023   TSH 1.41 12/26/2022   PSA 4.79 (H) 12/26/2022   INR 3.3 (A) 06/12/2023   HGBA1C 5.9 06/12/2023   MICROALBUR 16.0 (H) 06/12/2023    MR PROSTATE W WO CONTRAST  Result Date: 04/23/2023 CLINICAL DATA:  Elevated PSA level of 4 point 7 9 on 12/26/2022. EXAM: MR PROSTATE WITHOUT AND WITH CONTRAST TECHNIQUE: Multiplanar multisequence MRI images were obtained of the pelvis centered about the prostate. Pre and post contrast images were obtained. CONTRAST:  7 cc [facility] COMPARISON:  None Available. FINDINGS: Prostate: Region of interest # 1: PI-RADS category 3 lesion spanning the left anterior and left posterolateral peripheral zone in the mid gland with focally reduced T2 signal (image 41, series 9) corresponding to reduced ADC map activity (image 15, series 6). This measures 0.54 cc (1.3 by 0.3 by 1.0 cm). Volume: 3D volumetric analysis: Prostate volume 52.63 cc (5.1 by 4.2 by 5.3 cm). Transcapsular spread:  Absent Seminal vesicle involvement: Absent Neurovascular bundle involvement: Absent Pelvic adenopathy: Absent Bone  metastasis: Absent Other findings: No other significant findings. IMPRESSION: 1. PI-RADS category 3 lesion in the left peripheral zone. Targeting data sent to [facility]. 2. Mild prostatomegaly. Electronically Signed   By: Gaylyn Rong M.D.   On: 04/23/2023 16:06    Assessment & Plan:  Need for prophylactic vaccination and inoculation against varicella -     Shingrix; Inject 0.5  mLs into the muscle once for 1 dose.  Dispense: 0.5 mL; Refill: 0  Intrinsic eczema -     Triamcinolone Acetonide; Apply 1 Application topically 3 (three) times daily.  Dispense: 30 g; Refill: 2  Tinea cruris -     Ciclopirox Olamine; Apply topically 2 (two) times daily.  Dispense: 90 g; Refill: 2 -     Triamcinolone Acetonide; Apply 1 Application topically 3 (three) times daily.  Dispense: 30 g; Refill: 2  Type II diabetes mellitus with manifestations (HCC) -     HM Diabetes Foot Exam -     Basic metabolic panel; Future -     Hemoglobin A1c; Future -     Microalbumin / creatinine urine ratio; Future  Essential hypertension, malignant- EKG is negative for LVH. -     CBC with Differential/Platelet; Future -     Basic metabolic panel; Future -     Urinalysis, Routine w reflex microscopic; Future -     EKG 12-Lead  Other proteinuria -     Urinalysis, Routine w reflex microscopic; Future -     Microalbumin / creatinine urine ratio; Future  Crohn's disease of large intestine without complication (HCC)- He has been referred to GI.  Atherosclerosis of aorta (HCC)- Risk factor modifications addressed.  Microalbuminuria due to type 2 diabetes mellitus (HCC)- Will start an ARB. -     Olmesartan Medoxomil; Take 1 tablet (5 mg total) by mouth daily.  Dispense: 90 tablet; Refill: 1     Follow-up: Return in about 4 months (around 10/10/2023).  Sanda Linger, MD

## 2023-06-13 DIAGNOSIS — E1129 Type 2 diabetes mellitus with other diabetic kidney complication: Secondary | ICD-10-CM | POA: Insufficient documentation

## 2023-06-13 MED ORDER — OLMESARTAN MEDOXOMIL 5 MG PO TABS: 5.0000 mg | ORAL_TABLET | Freq: Every day | ORAL | 1 refills | Status: DC

## 2023-06-19 ENCOUNTER — Telehealth: Payer: Self-pay

## 2023-06-19 NOTE — Progress Notes (Signed)
   Care Guide Note  06/19/2023 Name: RUTVIK REUTER MRN: [id number] DOB: 1955/10/22  Referred by: Etta Grandchild, MD Reason for referral : Care Coordination (Outreach to schedule with Pharm d )   SHOGO SPEARIN is a 67 y.o. year old male who is a primary care patient of Etta Grandchild, MD. KIONDRE GUZEK was referred to the pharmacist for assistance related to DM.    An unsuccessful telephone outreach was attempted today to contact the patient who was referred to the pharmacy team for assistance with medication management. Additional attempts will be made to contact the patient.   Penne Lash, RMA Care Guide [facility]  [place], [place] [address] Direct Dial: [phone] [email or web address]

## 2023-06-19 NOTE — Progress Notes (Signed)
   Care Guide Note  06/19/2023 Name: Rick Edwards MRN: [id number] DOB: Dec 27, 1955  Referred by: Etta Grandchild, MD Reason for referral : Care Coordination (Outreach to schedule with Pharm d )   Rick Edwards is a 67 y.o. year old male who is a primary care patient of Etta Grandchild, MD. Rick Edwards was referred to the pharmacist for assistance related to DM.    Successful contact was made with the patient to discuss pharmacy services including being ready for the pharmacist to call at least 5 minutes before the scheduled appointment time, to have medication bottles and any blood sugar or blood pressure readings ready for review. The patient agreed to meet with the pharmacist via with the pharmacist via telephone visit on (date/time).  06/27/2023  Penne Lash, RMA Care Guide [facility]  [place], [place] [address] Direct Dial: [phone] [email or web address]

## 2023-06-20 NOTE — Telephone Encounter (Signed)
Rick Edwards called back to report she talked to Dr. Kathrynn Running, the urologist performing the biopsy, and he reports he will be prescribing Bactrim, 2 tablets the day of the procedure. He advised that since this is just 2 tablets and not a long course of Bactrim, plus the pt will be off of warfarin, that there should not be a problem with interactions. Thanked Rick Edwards for updating coumadin clinic.

## 2023-06-20 NOTE — Telephone Encounter (Signed)
Received call from pt today reporting he has been called and told he needs PCP to tell them when he can stop warfarin for a surgery. Pt is confused and does not know who contacted him or what the surgery is, but he thinks it is urology, for the prostate biopsy. He did get a number from them which was, [phone], ext [phone]  Contacted number and spoke with Tammy in radiology. She reports urology will perform the prostate biopsy at their facility. Pt is scheduled for prostate biopsy for 11/25 and Tammy is trying to get authorization to hold the pt's warfarin for 5 days. Advised pt can hold warfarin and will be provided with a lovenox bridge schedule around the procedure. She reports urology may need the PCP to sign off on the hold. Advised this nurse will send a msg to the CMA in urology that has sent a msg in Sept requesting a hold and it can be signed by PCP on Friday, 11/15, when this nurse is in that office. Tammy reported also that the urologist will prescribe an abx, only 2 tablets, BID for the day of the procedure, and he usually prescribes Augmentin or Bactrim. Advised it would be best to use Augmentin due to major interaction with Bactrim and warfarin. She reports the provider is aware the pt is on warfarin. She also reports the pt will be given instructions concerning when to take the abx and that he will also need to perform an enema at 7 am on the day of the procedure. This can be obtained OTC. She requested to help go over instructions with pt when he is in next week for instructions for the lovenox bridge. She said to make sure pt knows how to use it and that he does not think he is supposed to drink it. Advised this nurse will assure pt is instructed and questions answered if needed. She reports if pt has any other questions he can contact [facility].  Sent msg to CMA inquiring if they need to fax any warfarin hold authorization paperwork to fax it to the PCP office at [phone]. Make  attention Carollee Herter, RN, coumadin clinic.

## 2023-06-21 ENCOUNTER — Telehealth: Payer: Self-pay | Admitting: Urology

## 2023-06-21 NOTE — Telephone Encounter (Signed)
Contacted pt and scheduled a coumadin clinic apt for next week, 11/19, to go over instructions. Pt agreed. Pt uses [facility] on [address].

## 2023-06-21 NOTE — Telephone Encounter (Signed)
Lovenox Bridge; Actual Wt: 93 kg (>133 % over ideal, so adjusted wt is used for CrCl calculation Ideal Wt: 70 kg  Adjusted Wt: 79 kg  CrC: 88.88 mL/min using adjusted wt  Ok, for pt to use once daily dosing of Lovenox 1 mg/kg- 150 mg in the AM Current warfarin dosing is 5 mg daily, except take 7.5 mg on Monday, Wednesday and Friday  Bridge Instructions:  11/20: Take last dose of warfarin 11/21: NO warfarin, NO Lovenox 11/22: NO warfarin, Lovenox in the AM 11/23: NO warfarin, Lovenox in the AM 11/24: NO warfarin, Lovenox in the AM (BEFORE 7AM)  11/25: SURGERY; NO WARFARIN, NO LOVENOX  11/26: Take 1 1/2 tablets (7.5 mg) warfarin, Lovenox in the AM 11/27: Take 2 tablets (10 mg) warfarin, Lovenox in the AM 11/28: Take 1 1/2 tablets (7.5 mg) warfarin, Lovenox in the AM 11/29: Take 2 1/2 tablets (12.5 mg) warfarin, Lovenox in the AM 11/30: Take 1 tablet (5 mg) warfarin, Lovenox in the AM 12/1: Take 1 tablet (5 mg) warfarin, Lovenox in the AM 12/2: Recheck INR; No warfarin, No Lovenox until after INR check Recommend Lovenox 150 mg daily

## 2023-06-21 NOTE — Telephone Encounter (Signed)
Sherrie George, RN3 hours ago (10:09 AM)    Lovenox Bridge; Actual Wt: 93 kg (>133 % over ideal, so adjusted wt is used for CrCl calculation Ideal Wt: 70 kg             Adjusted Wt: 79 kg   CrC: 88.88 mL/min using adjusted wt   Ok, for pt to use once daily dosing of Lovenox 1 mg/kg- 150 mg in the AM Current warfarin dosing is 5 mg daily, except take 7.5 mg on Monday, Wednesday and Friday   Bridge Instructions:   11/20: Take last dose of warfarin 11/21: NO warfarin, NO Lovenox 11/22: NO warfarin, Lovenox in the AM 11/23: NO warfarin, Lovenox in the AM 11/24: NO warfarin, Lovenox in the AM (BEFORE 7AM)   11/25: SURGERY; NO WARFARIN, NO LOVENOX   11/26: Take 1 1/2 tablets (7.5 mg) warfarin, Lovenox in the AM 11/27: Take 2 tablets (10 mg) warfarin, Lovenox in the AM 11/28: Take 1 1/2 tablets (7.5 mg) warfarin, Lovenox in the AM 11/29: Take 2 1/2 tablets (12.5 mg) warfarin, Lovenox in the AM 11/30: Take 1 tablet (5 mg) warfarin, Lovenox in the AM 12/1: Take 1 tablet (5 mg) warfarin, Lovenox in the AM 12/2: Recheck INR; No warfarin, No Lovenox until after INR check Recommend Lovenox 150 mg daily

## 2023-06-26 ENCOUNTER — Telehealth: Payer: Self-pay

## 2023-06-26 ENCOUNTER — Ambulatory Visit (INDEPENDENT_AMBULATORY_CARE_PROVIDER_SITE_OTHER): Payer: Medicare Other

## 2023-06-26 DIAGNOSIS — Z7901 Long term (current) use of anticoagulants: Secondary | ICD-10-CM | POA: Diagnosis not present

## 2023-06-26 LAB — POCT INR: INR: 2.5 (ref 2.0–3.0)

## 2023-06-26 MED ORDER — ENOXAPARIN SODIUM 150 MG/ML IJ SOSY
150.0000 mg | PREFILLED_SYRINGE | INTRAMUSCULAR | 0 refills | Status: DC
Start: 2023-06-26 — End: 2023-07-16

## 2023-06-26 NOTE — Telephone Encounter (Signed)
Called pt, gave him [facility] contact info and urged him to call seeking biopsy prep instructions. Pt agreed to call and expressed understanding.

## 2023-06-26 NOTE — Telephone Encounter (Signed)
Pt in coumadin clinic today for INR check and lovenox bridge instructions. Pt reports he is unsure about any instructions given to him concerning an enema or abx before the procedure. Advised pt to contact urology, provided phone number, and if any problems to contact coumadin clinic. Advised this nurse would send a msg to urology that you need pre-op instructions. Pt verbalized understanding.   Forwarding msg to urology

## 2023-06-26 NOTE — Patient Instructions (Addendum)
Pre visit review using our clinic review tool, if applicable. No additional management support is needed unless otherwise documented below in the visit note.  Continue 1 tablet daily except take 1 1/2 tablets on Mondays, Wednesdays, and Fridays until you start instructions below. Recheck on 12/2.    11/20: Take last dose of warfarin 11/21: NO warfarin, NO Lovenox 11/22: NO warfarin, Lovenox in the AM 11/23: NO warfarin, Lovenox in the AM 11/24: NO warfarin, Lovenox in the AM (BEFORE 7AM)   11/25: SURGERY; NO WARFARIN, NO LOVENOX   11/26: Take 1 1/2 tablets (7.5 mg) warfarin, Lovenox in the AM 11/27: Take 2 tablets (10 mg) warfarin, Lovenox in the AM 11/28: Take 1 1/2 tablets (7.5 mg) warfarin, Lovenox in the AM 11/29: Take 2 1/2 tablets (12.5 mg) warfarin, Lovenox in the AM 11/30: Take 1 tablet (5 mg) warfarin, Lovenox in the AM 12/1: Take 1 tablet (5 mg) warfarin, Lovenox in the AM 12/2: Take 1 1/2 tablets (7.5 mg) warfarin, Lovenox in the AM 12/3: Recheck INR; NO WARFARIN, NO LOVENOX UNTIL AFTER RECHECK

## 2023-06-26 NOTE — Telephone Encounter (Signed)
Patient is aware that his paperwork has been completed and placed up front for pickup

## 2023-06-26 NOTE — Progress Notes (Signed)
Pt scheduled for prostate biopsy for 11/25 and will be on a lovenox bridge. Pt is in today for instructions and INR check.  Continue 1 tablet daily except take 1 1/2 tablets on Mondays, Wednesdays, and Fridays until you start instructions below. Recheck on 12/2.    11/20: Take last dose of warfarin 11/21: NO warfarin, NO Lovenox 11/22: NO warfarin, Lovenox in the AM 11/23: NO warfarin, Lovenox in the AM 11/24: NO warfarin, Lovenox in the AM (BEFORE 7AM)   11/25: SURGERY; NO WARFARIN, NO LOVENOX   11/26: Take 1 1/2 tablets (7.5 mg) warfarin, Lovenox in the AM 11/27: Take 2 tablets (10 mg) warfarin, Lovenox in the AM 11/28: Take 1 1/2 tablets (7.5 mg) warfarin, Lovenox in the AM 11/29: Take 2 1/2 tablets (12.5 mg) warfarin, Lovenox in the AM 11/30: Take 1 tablet (5 mg) warfarin, Lovenox in the AM 12/1: Take 1 tablet (5 mg) warfarin, Lovenox in the AM 12/2: Take 1 1/2 tablets (7.5 mg) warfarin, Lovenox in the AM 12/3: Recheck INR; NO WARFARIN, NO LOVENOX UNTIL AFTER RECHECK  Sent in Lovenox. Also sent msg to urology because pt was confused about instructions for pre-op from urology.

## 2023-06-26 NOTE — Telephone Encounter (Signed)
Pt in coumadin clinic today and brought paperwork for a renewal of his handicap placard. Gave paperwork to PCP's CMA for completion. Advised pt they would call when the paperwork was complete.

## 2023-06-27 ENCOUNTER — Telehealth: Payer: Self-pay

## 2023-06-27 ENCOUNTER — Other Ambulatory Visit: Payer: Medicare Other | Admitting: Pharmacist

## 2023-06-27 DIAGNOSIS — I10 Essential (primary) hypertension: Secondary | ICD-10-CM

## 2023-06-27 NOTE — Telephone Encounter (Signed)
Pt reports he missed his warfarin dose yesterday due to his brother being in an accident. He reports brother is in the hospital and outcome is not looking good.  Pt requesting what to do since he missed dose yesterday. Advised to take extra 1/2 tablet and then start hold tomorrow. Pt verbalized understanding.   Pt also reported he is waiting for a call from urology for further instructions and has already picked up lovenox syringes.

## 2023-06-27 NOTE — Progress Notes (Signed)
   06/27/2023 Name: Rick Edwards MRN: [id number] DOB: June 21, 1956  Chief Complaint  Patient presents with   Medication Management    Rick Edwards is a 67 y.o. year old male who presented for a telephone visit.   They were referred to the pharmacist by their PCP for assistance in managing diabetes and hypertension.   12/2 walk in BMP  No dizziness     Subjective:  Care Team: Primary Care Provider: Etta Grandchild, MD ; Next Scheduled Visit: 07/26/2023  Medication Access/Adherence  Current Pharmacy:  [facility] [phone] [place], [place] - [address] [place] [address] Phone: [phone] Fax: [phone]  [facility] - [place], [place] - [address] [place] [address] Phone: [phone] Fax: [phone]   Patient reports affordability concerns with their medications: No  Patient reports access/transportation concerns to their pharmacy: No  Patient reports adherence concerns with their medications:  No     Diabetes:  Current medications: none  Recently started on olmesartan 5 mg for kidney protection s/p elevated urine microalbumin. Pt confirms he did start  Hypertension:  Current medications: olmesartan 5 mg daily (started 11/6), amlodipine 10 mg daily, carvedilol 25 mg twice daily, spironolactone 25 mg daily, torsemide 20 mg daily   Patient does not have a validated, automated, upper arm home BP cuff. He goes to the pharmacy to check his BP. Current blood pressure readings: none recent  Patient denies hypotensive s/sx including dizziness, lightheadedness.    Objective:  Lab Results  Component Value Date   HGBA1C 5.9 06/12/2023    Lab Results  Component Value Date   CREATININE 1.09 06/12/2023   BUN 15 06/12/2023   NA 137 06/12/2023   K 4.5 06/12/2023   CL 100 06/12/2023   CO2 30 06/12/2023    Lab Results  Component Value Date   CHOL 148 12/26/2022   HDL 34.50 (L) 12/26/2022    LDLCALC 91 12/26/2022   LDLDIRECT 97.0 05/05/2019   TRIG 114.0 12/26/2022   CHOLHDL 4 12/26/2022    Medications Reviewed Today   Medications were not reviewed in this encounter       Assessment/Plan:   Diabetes: - Currently controlled - Recommend to continue olmesartan for kidney protection. Check BMP in 3 weeks to check renal function and potassium     Hypertension: - Currently uncontrolled, BP goal <130/80 - Reviewed appropriate blood pressure monitoring technique and reviewed goal blood pressure. Recommended to check home blood pressure and heart rate  - Recommend to continue current regimen. Will ask for BP reading on next call     Follow Up Plan: 12/2 walk in lab  Arbutus Leas, PharmD, BCPS Clinical Pharmacist [facility] [phone]

## 2023-06-27 NOTE — Patient Instructions (Addendum)
It was a pleasure speaking with you today!  Continue your current medications. Come 12/2 for walk-in blood work. I will call you with the results.  Make sure to check your blood pressure a few times over the next 2 weeks to make sure your blood pressure is staying at goal after this medication change.  Feel free to call with any questions or concerns!  Arbutus Leas, PharmD, BCPS [facility] Clinical Pharmacist [facility] [phone]

## 2023-06-28 ENCOUNTER — Other Ambulatory Visit: Payer: Self-pay | Admitting: Internal Medicine

## 2023-06-28 DIAGNOSIS — E876 Hypokalemia: Secondary | ICD-10-CM

## 2023-06-28 DIAGNOSIS — I10 Essential (primary) hypertension: Secondary | ICD-10-CM

## 2023-06-29 NOTE — Telephone Encounter (Signed)
Patient has picked up paperwork.

## 2023-07-02 DIAGNOSIS — R972 Elevated prostate specific antigen [PSA]: Secondary | ICD-10-CM | POA: Diagnosis not present

## 2023-07-03 ENCOUNTER — Ambulatory Visit: Payer: Medicare Other

## 2023-07-03 DIAGNOSIS — I4891 Unspecified atrial fibrillation: Secondary | ICD-10-CM | POA: Diagnosis not present

## 2023-07-03 DIAGNOSIS — K5090 Crohn's disease, unspecified, without complications: Secondary | ICD-10-CM | POA: Diagnosis not present

## 2023-07-03 DIAGNOSIS — K219 Gastro-esophageal reflux disease without esophagitis: Secondary | ICD-10-CM | POA: Diagnosis not present

## 2023-07-03 DIAGNOSIS — Z7901 Long term (current) use of anticoagulants: Secondary | ICD-10-CM | POA: Diagnosis not present

## 2023-07-03 DIAGNOSIS — K2950 Unspecified chronic gastritis without bleeding: Secondary | ICD-10-CM | POA: Diagnosis not present

## 2023-07-09 ENCOUNTER — Other Ambulatory Visit (INDEPENDENT_AMBULATORY_CARE_PROVIDER_SITE_OTHER): Payer: Medicare Other

## 2023-07-09 ENCOUNTER — Encounter: Payer: Self-pay | Admitting: Urology

## 2023-07-09 DIAGNOSIS — I10 Essential (primary) hypertension: Secondary | ICD-10-CM | POA: Diagnosis not present

## 2023-07-09 LAB — BASIC METABOLIC PANEL
BUN: 52 mg/dL — ABNORMAL HIGH (ref 6–23)
CO2: 23 meq/L (ref 19–32)
Calcium: 9.4 mg/dL (ref 8.4–10.5)
Chloride: 98 meq/L (ref 96–112)
Creatinine, Ser: 1.73 mg/dL — ABNORMAL HIGH (ref 0.40–1.50)
GFR: 41.04 mL/min — ABNORMAL LOW (ref >60.00)
Glucose, Bld: 106 mg/dL — ABNORMAL HIGH (ref 70–99)
Potassium: 4.5 meq/L (ref 3.5–5.1)
Sodium: 128 meq/L — ABNORMAL LOW (ref 135–145)

## 2023-07-10 ENCOUNTER — Ambulatory Visit: Payer: Medicare Other | Admitting: Internal Medicine

## 2023-07-10 ENCOUNTER — Ambulatory Visit (INDEPENDENT_AMBULATORY_CARE_PROVIDER_SITE_OTHER): Payer: Medicare Other

## 2023-07-10 ENCOUNTER — Encounter: Payer: Self-pay | Admitting: Internal Medicine

## 2023-07-10 ENCOUNTER — Telehealth: Payer: Self-pay

## 2023-07-10 VITALS — BP 118/74 | HR 69 | Temp 98.0°F | Ht 69.0 in | Wt 203.0 lb

## 2023-07-10 DIAGNOSIS — I10 Essential (primary) hypertension: Secondary | ICD-10-CM

## 2023-07-10 DIAGNOSIS — R1033 Periumbilical pain: Secondary | ICD-10-CM | POA: Insufficient documentation

## 2023-07-10 DIAGNOSIS — N179 Acute kidney failure, unspecified: Secondary | ICD-10-CM | POA: Diagnosis not present

## 2023-07-10 DIAGNOSIS — R748 Abnormal levels of other serum enzymes: Secondary | ICD-10-CM

## 2023-07-10 DIAGNOSIS — E871 Hypo-osmolality and hyponatremia: Secondary | ICD-10-CM | POA: Insufficient documentation

## 2023-07-10 DIAGNOSIS — Z7901 Long term (current) use of anticoagulants: Secondary | ICD-10-CM

## 2023-07-10 LAB — BASIC METABOLIC PANEL
BUN: 52 mg/dL — ABNORMAL HIGH (ref 6–23)
CO2: 24 meq/L (ref 19–32)
Calcium: 9.4 mg/dL (ref 8.4–10.5)
Chloride: 97 meq/L (ref 96–112)
Creatinine, Ser: 1.83 mg/dL — ABNORMAL HIGH (ref 0.40–1.50)
GFR: 38.36 mL/min — ABNORMAL LOW (ref >60.00)
Glucose, Bld: 107 mg/dL — ABNORMAL HIGH (ref 70–99)
Potassium: 5 meq/L (ref 3.5–5.1)
Sodium: 127 meq/L — ABNORMAL LOW (ref 135–145)

## 2023-07-10 LAB — URINALYSIS, ROUTINE W REFLEX MICROSCOPIC
Bilirubin Urine: NEGATIVE
Ketones, ur: NEGATIVE
Leukocytes,Ua: NEGATIVE
Nitrite: NEGATIVE
Specific Gravity, Urine: 1.015 (ref 1.000–1.030)
Total Protein, Urine: 30 — AB
Urine Glucose: NEGATIVE
Urobilinogen, UA: 0.2 (ref 0.0–1.0)
pH: 5.5 (ref 5.0–8.0)

## 2023-07-10 LAB — LIPASE: Lipase: 141 U/L — ABNORMAL HIGH (ref 11.0–59.0)

## 2023-07-10 LAB — POCT INR: INR: 1.9 — AB (ref 2.0–3.0)

## 2023-07-10 LAB — AMYLASE: Amylase: 169 U/L — ABNORMAL HIGH (ref 27–131)

## 2023-07-10 NOTE — Telephone Encounter (Signed)
Received fax from [facility], fax [phone], phone [phone], requesting 7 day warfarin hold for upcoming EGD and flex sig scheduled for 08/12/22.   Pt will be placed on a Lovenox bridge for procedure. Only 5 day warfarin hold is necessary to wash warfarin out, so warfarin will only be held for 5 days.  Completed fax request with instructions for last warfarin dose and restart date for warfarin. Advised pt will be educated on 12/20 at his next coumadin clinic apt for Lovenox bridge and warfarin dosing around procedure. Fax was signed by PCP before being faxed to number above.

## 2023-07-10 NOTE — Patient Instructions (Addendum)
Pre visit review using our clinic review tool, if applicable. No additional management support is needed unless otherwise documented below in the visit note.  Increase dose today to take 1 1/2 tablets and then continue 1 tablet daily except take 1 1/2 tablets on Mondays, Wednesdays, and Fridays. Stop lovenox injections. Recheck in 2 weeks.

## 2023-07-10 NOTE — Patient Instructions (Signed)
 Acute Kidney Injury, Adult Acute kidney injury is a sudden decrease in the ability of the kidneys to do what they are supposed to do. The kidneys are a pair of organs that: Make urine. Make hormones. Keep the right amount of fluids and chemicals in the body. This condition ranges from mild to severe. Over time, it may turn into long-term (chronic) kidney disease. Finding and treating the injury early may keep it from turning into chronic kidney disease. What are the causes? Common causes of this condition include: A problem with blood flow to the kidneys. This may be caused by: Low blood pressure, shock, or severe dehydration. Severe blood loss. Heart and blood vessel disease. Severe burns. Liver disease. Direct damage to the kidneys. This may be caused by: Certain medicines or toxins. Kidney disease. Contrast dye used in imaging tests. An infection of the kidney or bloodstream. Problems from surgery. Trauma to the kidney area. Organ failure. This includes heart or liver failure. A sudden block in urine flow. This may be caused by: Cancer. Kidney stones. An enlarged prostate. What increases the risk? You may be more likely to develop this condition if: You are older than 65 years of age. You are male. You are in the hospital. You may be even more at risk if you are very sick. You have certain conditions. These may include: Chronic kidney or liver disease. Diabetes. Heart disease and heart failure. Lung disease. What are the signs or symptoms? This condition may not cause symptoms until it becomes severe. If it does, symptoms may include: Feeling very tired or having trouble staying awake. Nausea or vomiting. Swelling (edema) of the face, legs, ankles, or feet. Pain in your abdomen, back, or along the side of your back (flank). Urine changes. You may: Make little or no urine. Pass urine with a weak flow. Muscle twitches and cramps. These are most often in the  legs. Confusion or trouble focusing. Not feeling the urge to eat. Fever. How is this diagnosed? This condition may be diagnosed based on your symptoms and your medical history. You may have a physical exam done. You may also have tests, such as: Blood tests. Urine tests. Imaging tests. A kidney biopsy. This is when a sample of kidney tissue is removed and looked at under a microscope. How is this treated? Treatment depends on the cause and how severe the condition is. In mild cases, treatment may not be needed. The kidneys may heal on their own. In severe cases, treatment may include: Treating the cause of the kidney injury. This may mean that you have to change your medicines or the doses you take. Getting fluids through an IV tube. Having a flexible tube (catheter) put in. This tube will drain urine and prevent blockages. Trying to keep problems from starting. This may mean not using certain medicines or not having tests done that could cause more injury. In some cases, you may also need: Dialysis or continuous renal replacement therapy (CRRT). This treatment uses a machine to do the job of the kidneys. Surgery. This may be done to repair a damaged kidney. It could also be done to remove a blockage in the urinary tract. Follow these instructions at home: Medicines Take over-the-counter and prescription medicines only as told by your health care provider. Do not take new medicines unless approved by your health care provider. Many medicines can make kidney damage worse. Do not take vitamin or mineral supplements unless approved by your health care provider. Some of  these can make kidney damage worse. Lifestyle  Make changes to your diet as told by your health care provider. You may need to eat less protein. Get to, and stay at, a healthy weight. If you need help, ask your health care provider. Start or keep up an exercise plan. Exercise at least 30 minutes a day, 5 days a week. Do not  use any products that contain nicotine or tobacco. These products include cigarettes, chewing tobacco, and vaping devices, such as e-cigarettes. If you need help quitting, ask your health care provider. General instructions  Keep track of your blood pressure. Tell your health care provider if you notice any changes. Keep your vaccines up to date. Ask your health care provider which vaccines you need. Keep all follow-up visits. Your health care provider will need to monitor your kidneys. Where to find support American Association of Kidney Patients: https://www.miller-montoya.com/ American Kidney Fund: [place].com.au Where to find more information [employer]: kidney.org Medical Education Institute: LifeOptions: lifeoptions.org Kidney School: kidneyschool.org Contact a health care provider if: Your symptoms get worse. You have new symptoms, such as: Headaches. Skin that is darker or lighter than normal. Easy bruising. Feeling itchy. Hiccups. Lack of menstrual periods. You have a fever. Get help right away if: You have signs of severe kidney disease, such as: Chest pain. Shortness of breath. Seizures. You have pain or bleeding when you pass urine. You make little or no urine. These symptoms may be an emergency. Get help right away. Call 911. Do not wait to see if the symptoms will go away. Do not drive yourself to the hospital. This information is not intended to replace advice given to you by your health care provider. Make sure you discuss any questions you have with your health care provider. Document Revised: 02/10/2022 Document Reviewed: 02/10/2022 Elsevier Patient Education  2024 [employer].

## 2023-07-10 NOTE — Progress Notes (Unsigned)
Subjective:  Patient ID: Rick Edwards, male    DOB: [birth year]  Age: 67 y.o. MRN: [id number]  CC: Abdominal Pain   HPI TIMOTHY HOSTEN presents for f/up ---  Discussed the use of AI scribe software for clinical note transcription with the patient, who gave verbal consent to proceed.  History of Present Illness   The patient presents with recent onset abdominal pain (2 days ago), described as cramping in nature. He denies any associated nausea, vomiting, or diarrhea. The pain has been intermittent, with the patient reporting no pain at the time of the consultation. He also denies any chest pain, shortness of breath, or swelling in the legs or feet. the abd pain has resolved.  Recent lab results indicate dehydration, although the patient is unaware of the cause. He has been under significant stress due to a family emergency, with his brother being hospitalized following an accident.   He is s/p recent prostate surgery.      Outpatient Medications Prior to Visit  Medication Sig Dispense Refill   allopurinol (ZYLOPRIM) 300 MG tablet TAKE 1 TABLET BY MOUTH EVERY DAY 90 tablet 1   amLODipine (NORVASC) 10 MG tablet TAKE 1 TABLET BY MOUTH EVERYDAY AT BEDTIME 90 tablet 1   atorvastatin (LIPITOR) 40 MG tablet TAKE 1 TABLET BY MOUTH EVERY DAY 90 tablet 1   carvedilol (COREG) 25 MG tablet TAKE 1 TABLET (25 MG TOTAL) BY MOUTH TWICE A DAY WITH MEALS 180 tablet 1   ciclopirox (LOPROX) 0.77 % cream Apply topically 2 (two) times daily. 90 g 2   enoxaparin (LOVENOX) 150 MG/ML injection Inject 1 mL (150 mg total) into the skin daily. USE AS DIRECTED BY ANTICOAGULATION CLINIC 10 mL 0   olmesartan (BENICAR) 5 MG tablet Take 1 tablet (5 mg total) by mouth daily. 90 tablet 1   pantoprazole (PROTONIX) 40 MG tablet Take 40 mg by mouth daily.     potassium chloride (KLOR-CON) 10 MEQ tablet TAKE 1 TABLET BY MOUTH TWICE A DAY 180 tablet 0   spironolactone (ALDACTONE) 25 MG tablet TAKE 1 TABLET (25 MG  TOTAL) BY MOUTH DAILY. 90 tablet 1   torsemide (DEMADEX) 20 MG tablet TAKE 1 TABLET BY MOUTH EVERY DAY 90 tablet 0   triamcinolone cream (KENALOG) 0.5 % Apply 1 Application topically 3 (three) times daily. 30 g 2   VASCEPA 1 g capsule Take 2 capsules (2 g total) by mouth 2 (two) times daily. 360 capsule 1   warfarin (COUMADIN) 5 MG tablet TAKE 1.5 TABLETS DAILY, EXCEPT TAKE 1 TABLET ON MON, WED, AND FRI AS DIRECTED BY CLINIC. 108 tablet 1   hyoscyamine (LEVSIN SL) 0.125 MG SL tablet Place under the tongue 2 (two) times daily as needed. (Patient not taking: Reported on 07/10/2023)     sulfamethoxazole-trimethoprim (BACTRIM DS) 800-160 MG tablet Take 1 tablet by mouth 2 (two) times daily.     No facility-administered medications prior to visit.    ROS Review of Systems  Constitutional:  Positive for unexpected weight change (wt loss). Negative for appetite change, chills, diaphoresis, fatigue and fever.  HENT: Negative.    Eyes: Negative.   Respiratory: Negative.  Negative for cough, chest tightness, shortness of breath and wheezing.   Cardiovascular:  Negative for chest pain, palpitations and leg swelling.  Gastrointestinal:  Positive for abdominal pain. Negative for constipation, diarrhea, nausea and vomiting.  Genitourinary: Negative.  Negative for decreased urine volume, difficulty urinating, dysuria, hematuria and urgency.  Musculoskeletal: Negative.  Skin: Negative.   Neurological:  Positive for weakness. Negative for dizziness and light-headedness.  Hematological:  Negative for adenopathy. Does not bruise/bleed easily.  Psychiatric/Behavioral: Negative.      Objective:  BP 118/74 (BP Location: Left Arm, Patient Position: Sitting, Cuff Size: Normal)   Pulse 69   Temp 98 F (36.7 C) (Oral)   Ht 5\' 9"  (1.753 m)   Wt 203 lb (92.1 kg)   SpO2 98%   BMI 29.98 kg/m   BP Readings from Last 3 Encounters:  07/10/23 118/74  06/12/23 132/80  01/10/23 136/86    Wt Readings from  Last 3 Encounters:  07/10/23 203 lb (92.1 kg)  06/12/23 205 lb (93 kg)  06/11/23 209 lb (94.8 kg)    Physical Exam Vitals reviewed.  Constitutional:      General: He is not in acute distress.    Appearance: He is not ill-appearing, toxic-appearing or diaphoretic.  HENT:     Nose: Nose normal.     Mouth/Throat:     Mouth: Mucous membranes are moist.  Eyes:     General: No scleral icterus.    Conjunctiva/sclera: Conjunctivae normal.  Cardiovascular:     Rate and Rhythm: Normal rate and regular rhythm.     Heart sounds: No murmur heard.    No friction rub. No gallop.  Pulmonary:     Effort: Pulmonary effort is normal.     Breath sounds: No stridor. No wheezing, rhonchi or rales.  Abdominal:     General: Abdomen is flat. Bowel sounds are normal. There is no distension.     Palpations: Abdomen is soft. There is no hepatomegaly, splenomegaly or mass.     Tenderness: There is no abdominal tenderness.     Hernia: No hernia is present.  Musculoskeletal:     Cervical back: Neck supple.  Lymphadenopathy:     Cervical: No cervical adenopathy.  Neurological:     Mental Status: He is alert.     Lab Results  Component Value Date   WBC 6.5 06/12/2023   HGB 14.9 06/12/2023   HCT 45.8 06/12/2023   PLT 383.0 06/12/2023   GLUCOSE 107 (H) 07/10/2023   CHOL 148 12/26/2022   TRIG 114.0 12/26/2022   HDL 34.50 (L) 12/26/2022   LDLDIRECT 97.0 05/05/2019   LDLCALC 91 12/26/2022   ALT 16 12/26/2022   AST 12 12/26/2022   NA 127 (L) 07/10/2023   K 5.0 07/10/2023   CL 97 07/10/2023   CREATININE 1.83 (H) 07/10/2023   BUN 52 (H) 07/10/2023   CO2 24 07/10/2023   TSH 1.41 12/26/2022   PSA 4.79 (H) 12/26/2022   INR 1.9 (A) 07/10/2023   HGBA1C 5.9 06/12/2023   MICROALBUR 16.0 (H) 06/12/2023    MR PROSTATE W WO CONTRAST  Result Date: 04/23/2023 CLINICAL DATA:  Elevated PSA level of 4 point 7 9 on 12/26/2022. EXAM: MR PROSTATE WITHOUT AND WITH CONTRAST TECHNIQUE: Multiplanar  multisequence MRI images were obtained of the pelvis centered about the prostate. Pre and post contrast images were obtained. CONTRAST:  7 cc [facility] COMPARISON:  None Available. FINDINGS: Prostate: Region of interest # 1: PI-RADS category 3 lesion spanning the left anterior and left posterolateral peripheral zone in the mid gland with focally reduced T2 signal (image 41, series 9) corresponding to reduced ADC map activity (image 15, series 6). This measures 0.54 cc (1.3 by 0.3 by 1.0 cm). Volume: 3D volumetric analysis: Prostate volume 52.63 cc (5.1 by 4.2 by 5.3 cm). Transcapsular spread:  Absent Seminal vesicle involvement: Absent Neurovascular bundle involvement: Absent Pelvic adenopathy: Absent Bone metastasis: Absent Other findings: No other significant findings. IMPRESSION: 1. PI-RADS category 3 lesion in the left peripheral zone. Targeting data sent to [facility]. 2. Mild prostatomegaly. Electronically Signed   By: Gaylyn Rong M.D.   On: 04/23/2023 16:06    Assessment & Plan:  Essential hypertension, malignant- His BP is over-controlled and K+ is 5.0. Will discontinue spironolactone. -     Urinalysis, Routine w reflex microscopic; Future -     Basic metabolic panel; Future  AKI (acute kidney injury) (HCC)- Stable. -     Basic metabolic panel; Future -     Amylase; Future -     Lipase; Future  Hyponatremia -     Sodium, urine, random; Future -     Basic metabolic panel; Future  Periumbilical abdominal pain- Amylase and lipase are mildly elevated. Will evaluate for pancreatitis. -     Amylase; Future -     Lipase; Future -     CT ABDOMEN PELVIS WO CONTRAST; Future  Elevated lipase -     CT ABDOMEN PELVIS WO CONTRAST; Future     Follow-up: Return in about 3 weeks (around 07/31/2023).  Sanda Linger, MD

## 2023-07-10 NOTE — Progress Notes (Signed)
Pt had prostate biopsy on 11/25 and was placed on a lovenox bridge. Pt also contacted coumadin clinic on 11/20 to report he missed warfarin dose the day before due to brother being in accident and him being at the hospital. Pt was advised to increase dose on 11/20 by 1/2 tablet. Pt also scheduled for endoscopy and sig for 08/13/23. GI is requesting a 7 day hold on warfarin. Pt will be placed on a lovenox bridge for this procedure and warfarin will be held 5 days, warfarin will wash out of system in 5 days and a 7 day hold is not necessary and places pt at great risk. Pt will be provided with lovenox bridge instructions on 12/20. Pt also has apt with PCP today. Increase dose today to take 1 1/2 tablets and then continue 1 tablet daily except take 1 1/2 tablets on Mondays, Wednesdays, and Fridays. Stop lovenox injections. Recheck in 2 weeks.

## 2023-07-11 DIAGNOSIS — R3129 Other microscopic hematuria: Secondary | ICD-10-CM | POA: Insufficient documentation

## 2023-07-11 DIAGNOSIS — R748 Abnormal levels of other serum enzymes: Secondary | ICD-10-CM | POA: Insufficient documentation

## 2023-07-11 LAB — SODIUM, URINE, RANDOM: Sodium, Ur: 18 mmol/L — ABNORMAL LOW (ref 28–272)

## 2023-07-11 NOTE — Telephone Encounter (Addendum)
Actual Wt: 93.1 kg Adjusted Wt: 70 kg CrCl: 39.85 mL/min using adjusted wt Severe reduction in kidney function since last surgery.   If CrCl >30, use normal lovenox dosing of 1 mg/kg Q12H or 1.5 mg/kg Q24H If CrCl<30, use reduced lovenox dosing of 1 mg/kg Q24H  Recommend reduced lovenox dosing of 1 mg/kg Q24H; (100 mg Q24H)  1/1: Take last dose of warfarin 1/2: NO warfarin, NO Lovenox 1/3: NO warfarin, Lovenox once in the AM 1/4: NO warfarin, Lovenox once in the AM 1/5: NO warfarin, Lovenox once in the AM(before 7 AM)  1/6: SURGERY; NO WARFARIN (UNLESS ADVISED BY SURGEON TO RESTART), NO LOVENOX  1/7: Take 1 1/2 tablets (7.5 mg) warfarin, Lovenox once in the AM 1/8: Take 2 1/2 tablets (12.5 mg) warfarin, Lovenox once in the AM 1/9: Take 1 1/2 tablets (7.5 mg) warfarin, Lovenox once in the AM 1/10: INR check; Take 2 tablets (10 mg) warfarin, Lovenox once in the AM

## 2023-07-13 ENCOUNTER — Other Ambulatory Visit: Payer: Self-pay | Admitting: Internal Medicine

## 2023-07-13 ENCOUNTER — Ambulatory Visit
Admission: RE | Admit: 2023-07-13 | Discharge: 2023-07-13 | Disposition: A | Discharge status: Home / Self Care | Payer: Medicare Other | Source: Ambulatory Visit | Attending: Internal Medicine | Admitting: Internal Medicine

## 2023-07-13 DIAGNOSIS — R748 Abnormal levels of other serum enzymes: Secondary | ICD-10-CM

## 2023-07-13 DIAGNOSIS — R1033 Periumbilical pain: Secondary | ICD-10-CM

## 2023-07-13 DIAGNOSIS — R3121 Asymptomatic microscopic hematuria: Secondary | ICD-10-CM

## 2023-07-13 DIAGNOSIS — N2889 Other specified disorders of kidney and ureter: Secondary | ICD-10-CM | POA: Insufficient documentation

## 2023-07-13 DIAGNOSIS — K439 Ventral hernia without obstruction or gangrene: Secondary | ICD-10-CM | POA: Diagnosis not present

## 2023-07-13 DIAGNOSIS — N281 Cyst of kidney, acquired: Secondary | ICD-10-CM | POA: Diagnosis not present

## 2023-07-13 DIAGNOSIS — R109 Unspecified abdominal pain: Secondary | ICD-10-CM | POA: Diagnosis not present

## 2023-07-13 DIAGNOSIS — R140 Abdominal distension (gaseous): Secondary | ICD-10-CM | POA: Diagnosis not present

## 2023-07-13 DIAGNOSIS — E785 Hyperlipidemia, unspecified: Secondary | ICD-10-CM

## 2023-07-15 ENCOUNTER — Other Ambulatory Visit: Payer: Self-pay | Admitting: Internal Medicine

## 2023-07-15 DIAGNOSIS — I10 Essential (primary) hypertension: Secondary | ICD-10-CM

## 2023-07-15 DIAGNOSIS — E876 Hypokalemia: Secondary | ICD-10-CM

## 2023-07-16 ENCOUNTER — Ambulatory Visit (INDEPENDENT_AMBULATORY_CARE_PROVIDER_SITE_OTHER): Payer: Medicare Other | Admitting: Family Medicine

## 2023-07-16 ENCOUNTER — Encounter: Payer: Self-pay | Admitting: Family Medicine

## 2023-07-16 VITALS — BP 80/50 | HR 62 | Temp 98.1°F | Resp 20 | Ht 69.0 in | Wt 200.0 lb

## 2023-07-16 DIAGNOSIS — I10 Essential (primary) hypertension: Secondary | ICD-10-CM | POA: Diagnosis not present

## 2023-07-16 NOTE — Progress Notes (Signed)
Assessment & Plan:  Essential hypertension, malignant Assessment & Plan: Overcontrolled.  Patient advised to stop spironolactone and continue amlodipine, Coreg, olmesartan, and torsemide.     Follow up plan: Return for as scheduled with PCP.  Deliah Boston, MSN, APRN, FNP-C  Subjective:  HPI: Rick Edwards is a 67 y.o. male presenting on 07/16/2023 for Hypotension (Taking Benicare 5 mg x 1 month - thinks it may be dropping his BP. Has been feeling light-headed and dizzy and often times "seeing bight white" /He states that his sister took BP, and was VERY low. He is uncertain of exact numbers. )  Patient reports he has been feeling lightheaded, a little dizzy, and seeing "white" for the past two days.  He reports that his sister took his blood pressure and that it was very low.  He is uncertain of exact numbers but feels like it was between 67-70 on the top. Patient has a diagnosis of essential hypertension.  He is prescribed amlodipine 10 mg daily, Coreg 25 mg twice daily, olmesartan 5 mg daily, and torsemide 20 mg daily.  Olmesartan 5 mg daily is the most recent start.  This was started on 06/12/2023 due to microalbuminuria in a type II diabetic.  He saw his PCP six days ago at which time it was felt his blood pressure was overcontrolled and his spironolactone was discontinued.  He was unaware of this change and has continued to take the spironolactone.    ROS: Negative unless specifically indicated above in HPI.   Relevant past medical history reviewed and updated as indicated.   Allergies and medications reviewed and updated.   Current Outpatient Medications:    allopurinol (ZYLOPRIM) 300 MG tablet, TAKE 1 TABLET BY MOUTH EVERY DAY, Disp: 90 tablet, Rfl: 1   amLODipine (NORVASC) 10 MG tablet, TAKE 1 TABLET BY MOUTH EVERYDAY AT BEDTIME, Disp: 90 tablet, Rfl: 1   atorvastatin (LIPITOR) 40 MG tablet, TAKE 1 TABLET BY MOUTH EVERY DAY, Disp: 90 tablet, Rfl: 1   carvedilol (COREG)  25 MG tablet, TAKE 1 TABLET (25 MG TOTAL) BY MOUTH TWICE A DAY WITH MEALS, Disp: 180 tablet, Rfl: 1   ciclopirox (LOPROX) 0.77 % cream, Apply topically 2 (two) times daily., Disp: 90 g, Rfl: 2   olmesartan (BENICAR) 5 MG tablet, Take 1 tablet (5 mg total) by mouth daily., Disp: 90 tablet, Rfl: 1   torsemide (DEMADEX) 20 MG tablet, TAKE 1 TABLET BY MOUTH EVERY DAY, Disp: 90 tablet, Rfl: 0   triamcinolone cream (KENALOG) 0.5 %, Apply 1 Application topically 3 (three) times daily., Disp: 30 g, Rfl: 2   VASCEPA 1 g capsule, Take 2 capsules (2 g total) by mouth 2 (two) times daily., Disp: 360 capsule, Rfl: 1   warfarin (COUMADIN) 5 MG tablet, TAKE 1.5 TABLETS DAILY, EXCEPT TAKE 1 TABLET ON MON, WED, AND FRI AS DIRECTED BY CLINIC., Disp: 108 tablet, Rfl: 1   hyoscyamine (LEVSIN SL) 0.125 MG SL tablet, Place under the tongue 2 (two) times daily as needed. (Patient not taking: Reported on 07/10/2023), Disp: , Rfl:   Allergies  Allergen Reactions   Mesalamine Rash    REACTION: Rash    Objective:   BP (!) 80/50   Pulse 62   Temp 98.1 F (36.7 C)   Resp 20   Ht 5\' 9"  (1.753 m)   Wt 200 lb (90.7 kg)   BMI 29.53 kg/m    Physical Exam Vitals reviewed.  Constitutional:      General:  He is not in acute distress.    Appearance: Normal appearance. He is not ill-appearing, toxic-appearing or diaphoretic.  HENT:     Head: Normocephalic and atraumatic.  Eyes:     General: No scleral icterus.       Right eye: No discharge.        Left eye: No discharge.     Conjunctiva/sclera: Conjunctivae normal.  Cardiovascular:     Rate and Rhythm: Normal rate and regular rhythm.     Heart sounds: Normal heart sounds.  Pulmonary:     Effort: Pulmonary effort is normal. No respiratory distress.  Musculoskeletal:        General: Normal range of motion.     Cervical back: Normal range of motion.  Skin:    General: Skin is warm and dry.  Neurological:     Mental Status: He is alert and oriented to person,  place, and time. Mental status is at baseline.  Psychiatric:        Mood and Affect: Mood normal.        Behavior: Behavior normal.        Thought Content: Thought content normal.        Judgment: Judgment normal.

## 2023-07-16 NOTE — Assessment & Plan Note (Signed)
Overcontrolled.  Patient advised to stop spironolactone and continue amlodipine, Coreg, olmesartan, and torsemide.

## 2023-07-17 NOTE — Telephone Encounter (Signed)
Noted.  Paperwork for [facility] was faxed.

## 2023-07-18 ENCOUNTER — Other Ambulatory Visit: Payer: Medicare Other | Admitting: Pharmacist

## 2023-07-18 DIAGNOSIS — I10 Essential (primary) hypertension: Secondary | ICD-10-CM

## 2023-07-18 NOTE — Progress Notes (Signed)
   07/18/2023 Name: Rick Edwards MRN: [id number] DOB: May 22, 1956  Chief Complaint  Patient presents with   Hypertension   Medication Management     Rick Edwards is a 67 y.o. year old male who presented for a telephone visit.   They were referred to the pharmacist by their PCP for assistance in managing diabetes and hypertension.    Subjective:  Care Team: Primary Care Provider: Etta Grandchild, MD ; Next Scheduled Visit: 07/26/2023  Medication Access/Adherence  Current Pharmacy:  [phone] [place], [place] - [address] [place] [address] Phone: [phone] Fax: [phone]  [facility] - [place], [place] - [address] [place] [address] Phone: [phone] Fax: [phone]   Patient reports affordability concerns with their medications: No  Patient reports access/transportation concerns to their pharmacy: No  Patient reports adherence concerns with their medications:  No     Diabetes:  Current medications: none  Recently started on olmesartan 5 mg for kidney protection s/p elevated urine microalbumin. Pt feels his recent issues all started due to starting the olmesartan  Hypertension:  Current medications: olmesartan 5 mg daily (started 11/6), amlodipine 10 mg daily, carvedilol 25 mg twice daily, torsemide 20 mg daily  Stopped spironolactone 25 mg after 12/9 appt   Patient does not have a validated, automated, upper arm home BP cuff. He goes to the pharmacy to check his BP. Current blood pressure readings: none since 12/9  Patient denies hypotensive s/sx including dizziness, lightheadedness. Pt reports feeling very fatigued. Patient states he is drinking a lot of water, staying hydrated   Objective:  BP Readings from Last 3 Encounters:  07/16/23 (!) 80/50  07/10/23 118/74  06/12/23 132/80     Lab Results  Component Value Date   HGBA1C 5.9 06/12/2023    Lab Results   Component Value Date   CREATININE 1.83 (H) 07/10/2023   BUN 52 (H) 07/10/2023   NA 127 (L) 07/10/2023   K 5.0 07/10/2023   CL 97 07/10/2023   CO2 24 07/10/2023    Lab Results  Component Value Date   CHOL 148 12/26/2022   HDL 34.50 (L) 12/26/2022   LDLCALC 91 12/26/2022   LDLDIRECT 97.0 05/05/2019   TRIG 114.0 12/26/2022   CHOLHDL 4 12/26/2022    Medications Reviewed Today   Medications were not reviewed in this encounter       Assessment/Plan:   Hypertension: - Has been over controlled, BP goal <130/80, however do not have BP reading since stopping spironolactone. - Recommended check BP today. - Will call Friday, if still feeling tired or BP low on home BP check, stop olmesartan - Has PCP follow up on 12/19, needs BMP re check    Follow Up Plan: 12/13 phone call  Arbutus Leas, PharmD, BCPS Clinical Pharmacist [facility] [phone]

## 2023-07-20 ENCOUNTER — Other Ambulatory Visit: Payer: Self-pay | Admitting: Internal Medicine

## 2023-07-20 ENCOUNTER — Other Ambulatory Visit: Payer: Medicare Other | Admitting: Pharmacist

## 2023-07-20 DIAGNOSIS — I10 Essential (primary) hypertension: Secondary | ICD-10-CM

## 2023-07-20 DIAGNOSIS — M1A39X0 Chronic gout due to renal impairment, multiple sites, without tophus (tophi): Secondary | ICD-10-CM

## 2023-07-20 MED ORDER — AMLODIPINE BESYLATE 10 MG PO TABS: 5.0000 mg | ORAL_TABLET | Freq: Every day | ORAL | Status: DC

## 2023-07-20 NOTE — Progress Notes (Signed)
07/20/2023 Name: Rick Edwards MRN: [id number] DOB: [birth year]  Chief Complaint  Patient presents with   Hypertension   Medication Management     ODEAN BLYE is a 67 y.o. year old male who presented for a telephone visit.   They were referred to the pharmacist by their PCP for assistance in managing diabetes and hypertension.    Subjective:  Care Team: Primary Care Provider: Etta Grandchild, MD ; Next Scheduled Visit: 07/26/2023  Medication Access/Adherence  Current Pharmacy:  [facility] [phone] [place], [place] - [address] [place] [address] Phone: [phone] Fax: [phone]  [facility] - [place], [place] - [address] [place] [address] Phone: [phone] Fax: [phone]   Patient reports affordability concerns with their medications: No  Patient reports access/transportation concerns to their pharmacy: No  Patient reports adherence concerns with their medications:  No     Diabetes:  Current medications: none  Recently started on olmesartan 5 mg for kidney protection s/p elevated urine microalbumin. Pt feels his recent issues all started due to starting the olmesartan  Hypertension:  Current medications: olmesartan 5 mg daily (started 11/6), amlodipine 10 mg daily, carvedilol 25 mg twice daily, torsemide 20 mg daily  Stopped spironolactone 25 mg after 12/9 appt He notes he feels he has not been feeling well since starting the olmesartan  Patient does not have a validated, automated, upper arm home BP cuff. He goes to the pharmacy to check his BP. Current blood pressure readings: Yesterday 81/56 (77) Today 76/55 (75)   Patient denies hypotensive s/sx including dizziness, lightheadedness. Pt reports feeling very fatigued. Patient states he is drinking a lot of water, staying hydrated. Notes he is urinating normally.   Objective:  BP Readings from Last 3 Encounters:  07/20/23  (!) 76/55  07/16/23 (!) 80/50  07/10/23 118/74     Lab Results  Component Value Date   HGBA1C 5.9 06/12/2023    Lab Results  Component Value Date   CREATININE 1.83 (H) 07/10/2023   BUN 52 (H) 07/10/2023   NA 127 (L) 07/10/2023   K 5.0 07/10/2023   CL 97 07/10/2023   CO2 24 07/10/2023    Lab Results  Component Value Date   CHOL 148 12/26/2022   HDL 34.50 (L) 12/26/2022   LDLCALC 91 12/26/2022   LDLDIRECT 97.0 05/05/2019   TRIG 114.0 12/26/2022   CHOLHDL 4 12/26/2022    Medications Reviewed Today     Reviewed by [name], RPH (Pharmacist) on 07/20/23 at 1452  Med List Status: <None>   Medication Order Taking? Sig Documenting Provider Last Dose Status Informant  allopurinol (ZYLOPRIM) 300 MG tablet [id number]  TAKE 1 TABLET BY MOUTH EVERY DAY Etta Grandchild, MD  Active   amLODipine (NORVASC) 10 MG tablet [id number] Yes Take 0.5 tablets (5 mg total) by mouth daily. Etta Grandchild, MD Taking Active   atorvastatin (LIPITOR) 40 MG tablet [id number]  TAKE 1 TABLET BY MOUTH EVERY DAY Etta Grandchild, MD  Active   carvedilol (COREG) 25 MG tablet [id number] Yes TAKE 1 TABLET (25 MG TOTAL) BY MOUTH TWICE A DAY WITH MEALS Etta Grandchild, MD Taking Active   ciclopirox (LOPROX) 0.77 % cream [id number]  Apply topically 2 (two) times daily. Etta Grandchild, MD  Active   hyoscyamine (LEVSIN SL) 0.125 MG SL tablet [id number]  Place under the tongue 2 (two) times daily as needed.  Patient not taking: Reported on  07/10/2023   [provider]  Active            Med Note Michaela Corner   Thu Mar 17, 2021  2:29 PM) 03/17/21: Reports has not needed recently  torsemide (DEMADEX) 20 MG tablet [id number] Yes TAKE 1 TABLET BY MOUTH EVERY DAY Etta Grandchild, MD Taking Active   triamcinolone cream (KENALOG) 0.5 % [id number]  Apply 1 Application topically 3 (three) times daily. Etta Grandchild, MD  Active   VASCEPA 1 g capsule [id number]  Take 2 capsules (2 g total) by mouth 2 (two)  times daily. Etta Grandchild, MD  Active   warfarin (COUMADIN) 5 MG tablet [id number]  TAKE 1.5 TABLETS DAILY, EXCEPT TAKE 1 TABLET ON MON, WED, AND FRI AS DIRECTED BY CLINIC. Etta Grandchild, MD  Active   Med List Note Thomasene Mohair, CPhT 05/10/12 1734): Patient family member states that doctor wanted him to stop the coumadin before procedure.               Assessment/Plan:   Hypertension: - Has been over controlled, BP goal <130/80. BP continues to be very low and pt is symptomatic - Significant increase in Scr 12/2 and 12/3 after starting olmesartan however pt also had signs of pancreatitis per PCP. Recommend repeating BMP - Recommend stopping olmesartan and decrease amlodipine to 5 mg - Patient to come for PCP appt on 12/19    Follow Up Plan: Will review 12/19 appt notes and determine follow up  Arbutus Leas, PharmD, BCPS Clinical Pharmacist [facility] [phone]

## 2023-07-26 ENCOUNTER — Ambulatory Visit (INDEPENDENT_AMBULATORY_CARE_PROVIDER_SITE_OTHER): Payer: Medicare Other | Admitting: Internal Medicine

## 2023-07-26 ENCOUNTER — Encounter: Payer: Self-pay | Admitting: Internal Medicine

## 2023-07-26 VITALS — BP 138/76 | HR 71 | Temp 98.3°F | Resp 16 | Ht 69.0 in | Wt 204.2 lb

## 2023-07-26 DIAGNOSIS — N1832 Chronic kidney disease, stage 3b: Secondary | ICD-10-CM | POA: Insufficient documentation

## 2023-07-26 DIAGNOSIS — Z23 Encounter for immunization: Secondary | ICD-10-CM

## 2023-07-26 DIAGNOSIS — E871 Hypo-osmolality and hyponatremia: Secondary | ICD-10-CM

## 2023-07-26 DIAGNOSIS — I10 Essential (primary) hypertension: Secondary | ICD-10-CM

## 2023-07-26 DIAGNOSIS — R3129 Other microscopic hematuria: Secondary | ICD-10-CM | POA: Diagnosis not present

## 2023-07-26 DIAGNOSIS — N1831 Chronic kidney disease, stage 3a: Secondary | ICD-10-CM

## 2023-07-26 LAB — URINALYSIS, ROUTINE W REFLEX MICROSCOPIC
Bilirubin Urine: NEGATIVE
Ketones, ur: NEGATIVE
Leukocytes,Ua: NEGATIVE
Nitrite: NEGATIVE
Specific Gravity, Urine: 1.01 (ref 1.000–1.030)
Total Protein, Urine: NEGATIVE
Urine Glucose: NEGATIVE
Urobilinogen, UA: 0.2 (ref 0.0–1.0)
pH: 5.5 (ref 5.0–8.0)

## 2023-07-26 LAB — BASIC METABOLIC PANEL
BUN: 31 mg/dL — ABNORMAL HIGH (ref 6–23)
CO2: 26 meq/L (ref 19–32)
Calcium: 9.4 mg/dL (ref 8.4–10.5)
Chloride: 98 meq/L (ref 96–112)
Creatinine, Ser: 1.42 mg/dL (ref 0.40–1.50)
GFR: 52 mL/min — ABNORMAL LOW (ref >60.00)
Glucose, Bld: 104 mg/dL — ABNORMAL HIGH (ref 70–99)
Potassium: 4.3 meq/L (ref 3.5–5.1)
Sodium: 133 meq/L — ABNORMAL LOW (ref 135–145)

## 2023-07-26 NOTE — Progress Notes (Signed)
Subjective:  Patient ID: Rick Edwards, male    DOB: 01/21/1956  Age: 67 y.o. MRN: [id number]  CC: Hypertension   HPI Rick Edwards presents for f/up ----  Discussed the use of AI scribe software for clinical note transcription with the patient, who gave verbal consent to proceed.  History of Present Illness   The patient, with a history of hypertension and recent prostate biopsy, reports no new health issues since the last visit. He denies experiencing dizziness, lightheadedness, chest pain, shortness of breath, or edema. The recent prostate biopsy results were reassuring, indicating no evidence of prostate cancer.  The patient had previously experienced symptoms of hypotension, including weakness, dizziness, and lightheadedness, after starting a new antihypertensive medication. However, he reports feeling well at present. He has been taking a reduced dose of amlodipine, as advised.  The patient denies any weight loss, instead noting weight gain. He also denies any symptoms of gastrointestinal upset, such as nausea, vomiting, or abdominal pain.       Outpatient Medications Prior to Visit  Medication Sig Dispense Refill   allopurinol (ZYLOPRIM) 300 MG tablet TAKE 1 TABLET BY MOUTH EVERY DAY 90 tablet 1   amLODipine (NORVASC) 10 MG tablet Take 0.5 tablets (5 mg total) by mouth daily.     atorvastatin (LIPITOR) 40 MG tablet TAKE 1 TABLET BY MOUTH EVERY DAY 90 tablet 1   carvedilol (COREG) 25 MG tablet TAKE 1 TABLET (25 MG TOTAL) BY MOUTH TWICE A DAY WITH MEALS 180 tablet 1   ciclopirox (LOPROX) 0.77 % cream Apply topically 2 (two) times daily. 90 g 2   hyoscyamine (LEVSIN SL) 0.125 MG SL tablet Place under the tongue 2 (two) times daily as needed.     torsemide (DEMADEX) 20 MG tablet TAKE 1 TABLET BY MOUTH EVERY DAY 90 tablet 0   triamcinolone cream (KENALOG) 0.5 % Apply 1 Application topically 3 (three) times daily. 30 g 2   VASCEPA 1 g capsule Take 2 capsules (2 g total) by  mouth 2 (two) times daily. 360 capsule 1   warfarin (COUMADIN) 5 MG tablet TAKE 1.5 TABLETS DAILY, EXCEPT TAKE 1 TABLET ON MON, WED, AND FRI AS DIRECTED BY CLINIC. 108 tablet 1   No facility-administered medications prior to visit.    ROS Review of Systems  Constitutional:  Negative for chills, diaphoresis, fatigue and fever.  HENT:  Positive for hearing loss. Negative for ear pain and sore throat.   Eyes: Negative.  Negative for visual disturbance.  Respiratory:  Negative for cough, chest tightness, shortness of breath and wheezing.   Cardiovascular:  Negative for chest pain, palpitations and leg swelling.  Gastrointestinal:  Negative for abdominal pain, constipation, diarrhea, nausea and vomiting.  Endocrine: Negative.   Genitourinary: Negative.  Negative for difficulty urinating.  Musculoskeletal: Negative.   Skin: Negative.   Neurological: Negative.  Negative for dizziness and weakness.  Hematological:  Negative for adenopathy. Does not bruise/bleed easily.  Psychiatric/Behavioral:  Positive for confusion and decreased concentration.     Objective:  BP 138/76 (BP Location: Left Arm, Patient Position: Sitting, Cuff Size: Normal)   Pulse 71   Temp 98.3 F (36.8 C) (Oral)   Resp 16   Ht 5\' 9"  (1.753 m)   Wt 204 lb 3.2 oz (92.6 kg)   SpO2 94%   BMI 30.16 kg/m   BP Readings from Last 3 Encounters:  07/26/23 138/76  07/20/23 (!) 76/55  07/16/23 (!) 80/50    Wt Readings from Last  3 Encounters:  07/26/23 204 lb 3.2 oz (92.6 kg)  07/16/23 200 lb (90.7 kg)  07/10/23 203 lb (92.1 kg)    Physical Exam Vitals reviewed.  Constitutional:      Appearance: Normal appearance.  HENT:     Mouth/Throat:     Mouth: Mucous membranes are moist.  Eyes:     General: No scleral icterus.    Conjunctiva/sclera: Conjunctivae normal.  Cardiovascular:     Rate and Rhythm: Normal rate and regular rhythm.     Heart sounds: No murmur heard. Pulmonary:     Effort: Pulmonary effort is  normal.     Breath sounds: No stridor. No wheezing, rhonchi or rales.  Abdominal:     General: Abdomen is flat.     Palpations: There is no mass.     Tenderness: There is no abdominal tenderness. There is no guarding.     Hernia: No hernia is present.  Musculoskeletal:        General: Normal range of motion.     Cervical back: Neck supple.     Right lower leg: No edema.     Left lower leg: No edema.  Lymphadenopathy:     Cervical: No cervical adenopathy.  Skin:    General: Skin is warm and dry.  Neurological:     General: No focal deficit present.     Mental Status: He is alert. Mental status is at baseline.  Psychiatric:        Mood and Affect: Mood normal.        Behavior: Behavior normal.     Lab Results  Component Value Date   WBC 6.5 06/12/2023   HGB 14.9 06/12/2023   HCT 45.8 06/12/2023   PLT 383.0 06/12/2023   GLUCOSE 104 (H) 07/26/2023   CHOL 148 12/26/2022   TRIG 114.0 12/26/2022   HDL 34.50 (L) 12/26/2022   LDLDIRECT 97.0 05/05/2019   LDLCALC 91 12/26/2022   ALT 16 12/26/2022   AST 12 12/26/2022   NA 133 (L) 07/26/2023   K 4.3 07/26/2023   CL 98 07/26/2023   CREATININE 1.42 07/26/2023   BUN 31 (H) 07/26/2023   CO2 26 07/26/2023   TSH 1.41 12/26/2022   PSA 4.79 (H) 12/26/2022   INR 1.9 (A) 07/27/2023   HGBA1C 5.9 06/12/2023   MICROALBUR 16.0 (H) 06/12/2023    CT ABDOMEN PELVIS WO CONTRAST Result Date: 07/13/2023 CLINICAL DATA:  Acute periumbilical abdominal pain and distention. History of Crohn's disease. Elevated lipase. Previous partial prostatectomy. EXAM: CT ABDOMEN AND PELVIS WITHOUT CONTRAST TECHNIQUE: Multidetector CT imaging of the abdomen and pelvis was performed following the standard protocol without IV contrast. RADIATION DOSE REDUCTION: This exam was performed according to the departmental dose-optimization program which includes automated exposure control, adjustment of the mA and/or kV according to patient size and/or use of iterative  reconstruction technique. COMPARISON:  05/08/2013. FINDINGS: Lower chest: Normal-sized heart. Small amount of bibasilar linear atelectasis or scarring. Hepatobiliary: No significant change in multiple small liver cysts. These do not need imaging follow-up. Unremarkable gallbladder. Pancreas: Unremarkable. No pancreatic ductal dilatation or surrounding inflammatory changes. Spleen: Normal in size without focal abnormality. Adrenals/Urinary Tract: Minimal mild bilateral adrenal hyperplasia. Multiple small bilateral renal cysts. Some of these are water density, others are medium density and some are high density. Some of these are new. Poorly distended urinary bladder with mild to moderate diffuse wall thickening with mild progression. Unremarkable ureters. No hydronephrosis or calculi seen. Stomach/Bowel: Status post partial colectomy with  unremarkable remaining colon and small bowel. The stomach remains distended and filled with oral contrast and air. Normal passage of contrast into the small bowel and colon. Vascular/Lymphatic: Atheromatous arterial calcifications without aneurysm. No enlarged lymph nodes. Reproductive: Moderately enlarged prostate gland. Other: Interval moderate-sized left ventral hernia with herniated small bowel without bowel wall thickening or dilatation. Small adjacent midline ventral hernia containing herniation of a portion of a small bowel loop without bowel wall thickening or dilatation. Multiple right ventral subcutaneous injection changes. Musculoskeletal: Lumbar and lower thoracic spine degenerative changes. IMPRESSION: 1. No acute abnormality. 2. Interval moderate-sized left ventral hernia with herniated small bowel without obstruction. 3. Small adjacent midline ventral hernia containing herniation of a portion of a small bowel loop without obstruction. 4. Moderately enlarged prostate gland. 5. Mild to moderate diffuse urinary bladder wall thickening with mild progression. This could be  due to chronic bladder outlet obstruction by the enlarged prostate. Chronic cystitis is less likely. 6. Multiple small bilateral renal cysts. Some of these are water density, others are medium density and some are high density. Some of these are new. Many of these are indeterminate for the possibility of a solid mass. Therefore, further evaluation with renal ultrasound or pre and postcontrast MRI of the kidneys is recommended. Electronically Signed   By: Beckie Salts M.D.   On: 07/13/2023 11:28    Assessment & Plan:   Essential hypertension, malignant- BP is well controlled. -     Basic metabolic panel; Future -     Urinalysis, Routine w reflex microscopic; Future  Hyponatremia- Will avoid diuretics. -     Basic metabolic panel; Future  Other microscopic hematuria- Abd MRI has been ordered. -     Urinalysis, Routine w reflex microscopic; Future  Stage 3a chronic kidney disease (HCC)- renal function is stable. -     Basic metabolic panel; Future -     Urinalysis, Routine w reflex microscopic; Future  Need for vaccination -     Pneumococcal polysaccharide vaccine 23-valent greater than or equal to 65yo subcutaneous/IM     Follow-up: Return in about 3 months (around 10/24/2023).  Sanda Linger, MD

## 2023-07-26 NOTE — Patient Instructions (Signed)
Hypertension, Adult High blood pressure (hypertension) is when the force of blood pumping through the arteries is too strong. The arteries are the blood vessels that carry blood from the heart throughout the body. Hypertension forces the heart to work harder to pump blood and may cause arteries to become narrow or stiff. Untreated or uncontrolled hypertension can lead to a heart attack, heart failure, a stroke, kidney disease, and other problems. A blood pressure reading consists of a higher number over a lower number. Ideally, your blood pressure should be below 120/80. The first ("top") number is called the systolic pressure. It is a measure of the pressure in your arteries as your heart beats. The second ("bottom") number is called the diastolic pressure. It is a measure of the pressure in your arteries as the heart relaxes. What are the causes? The exact cause of this condition is not known. There are some conditions that result in high blood pressure. What increases the risk? Certain factors may make you more likely to develop high blood pressure. Some of these risk factors are under your control, including: Smoking. Not getting enough exercise or physical activity. Being overweight. Having too much fat, sugar, calories, or salt (sodium) in your diet. Drinking too much alcohol. Other risk factors include: Having a personal history of heart disease, diabetes, high cholesterol, or kidney disease. Stress. Having a family history of high blood pressure and high cholesterol. Having obstructive sleep apnea. Age. The risk increases with age. What are the signs or symptoms? High blood pressure may not cause symptoms. Very high blood pressure (hypertensive crisis) may cause: Headache. Fast or irregular heartbeats (palpitations). Shortness of breath. Nosebleed. Nausea and vomiting. Vision changes. Severe chest pain, dizziness, and seizures. How is this diagnosed? This condition is diagnosed by  measuring your blood pressure while you are seated, with your arm resting on a flat surface, your legs uncrossed, and your feet flat on the floor. The cuff of the blood pressure monitor will be placed directly against the skin of your upper arm at the level of your heart. Blood pressure should be measured at least twice using the same arm. Certain conditions can cause a difference in blood pressure between your right and left arms. If you have a high blood pressure reading during one visit or you have normal blood pressure with other risk factors, you may be asked to: Return on a different day to have your blood pressure checked again. Monitor your blood pressure at home for 1 week or longer. If you are diagnosed with hypertension, you may have other blood or imaging tests to help your health care provider understand your overall risk for other conditions. How is this treated? This condition is treated by making healthy lifestyle changes, such as eating healthy foods, exercising more, and reducing your alcohol intake. You may be referred for counseling on a healthy diet and physical activity. Your health care provider may prescribe medicine if lifestyle changes are not enough to get your blood pressure under control and if: Your systolic blood pressure is above 130. Your diastolic blood pressure is above 80. Your personal target blood pressure may vary depending on your medical conditions, your age, and other factors. Follow these instructions at home: Eating and drinking  Eat a diet that is high in fiber and potassium, and low in sodium, added sugar, and fat. An example of this eating plan is called the DASH diet. DASH stands for Dietary Approaches to Stop Hypertension. To eat this way: Eat   plenty of fresh fruits and vegetables. Try to fill one half of your plate at each meal with fruits and vegetables. Eat whole grains, such as whole-wheat pasta, brown rice, or whole-grain bread. Fill about one  fourth of your plate with whole grains. Eat or drink low-fat dairy products, such as skim milk or low-fat yogurt. Avoid fatty cuts of meat, processed or cured meats, and poultry with skin. Fill about one fourth of your plate with lean proteins, such as fish, chicken without skin, beans, eggs, or tofu. Avoid pre-made and processed foods. These tend to be higher in sodium, added sugar, and fat. Reduce your daily sodium intake. Many people with hypertension should eat less than 1,500 mg of sodium a day. Do not drink alcohol if: Your health care provider tells you not to drink. You are pregnant, may be pregnant, or are planning to become pregnant. If you drink alcohol: Limit how much you have to: 0-1 drink a day for women. 0-2 drinks a day for men. Know how much alcohol is in your drink. In the [place] one drink equals one 12 oz bottle of beer (355 mL), one 5 oz glass of wine (148 mL), or one 1 oz glass of hard liquor (44 mL). Lifestyle  Work with your health care provider to maintain a healthy body weight or to lose weight. Ask what an ideal weight is for you. Get at least 30 minutes of exercise that causes your heart to beat faster (aerobic exercise) most days of the week. Activities may include walking, swimming, or biking. Include exercise to strengthen your muscles (resistance exercise), such as Pilates or lifting weights, as part of your weekly exercise routine. Try to do these types of exercises for 30 minutes at least 3 days a week. Do not use any products that contain nicotine or tobacco. These products include cigarettes, chewing tobacco, and vaping devices, such as e-cigarettes. If you need help quitting, ask your health care provider. Monitor your blood pressure at home as told by your health care provider. Keep all follow-up visits. This is important. Medicines Take over-the-counter and prescription medicines only as told by your health care provider. Follow directions carefully. Blood  pressure medicines must be taken as prescribed. Do not skip doses of blood pressure medicine. Doing this puts you at risk for problems and can make the medicine less effective. Ask your health care provider about side effects or reactions to medicines that you should watch for. Contact a health care provider if you: Think you are having a reaction to a medicine you are taking. Have headaches that keep coming back (recurring). Feel dizzy. Have swelling in your ankles. Have trouble with your vision. Get help right away if you: Develop a severe headache or confusion. Have unusual weakness or numbness. Feel faint. Have severe pain in your chest or abdomen. Vomit repeatedly. Have trouble breathing. These symptoms may be an emergency. Get help right away. Call 911. Do not wait to see if the symptoms will go away. Do not drive yourself to the hospital. Summary Hypertension is when the force of blood pumping through your arteries is too strong. If this condition is not controlled, it may put you at risk for serious complications. Your personal target blood pressure may vary depending on your medical conditions, your age, and other factors. For most people, a normal blood pressure is less than 120/80. Hypertension is treated with lifestyle changes, medicines, or a combination of both. Lifestyle changes include losing weight, eating a healthy,   low-sodium diet, exercising more, and limiting alcohol. This information is not intended to replace advice given to you by your health care provider. Make sure you discuss any questions you have with your health care provider. Document Revised: 05/31/2021 Document Reviewed: 05/31/2021 Elsevier Patient Education  2024 Elsevier Inc.  

## 2023-07-27 ENCOUNTER — Ambulatory Visit (INDEPENDENT_AMBULATORY_CARE_PROVIDER_SITE_OTHER): Payer: Medicare Other

## 2023-07-27 DIAGNOSIS — Z7901 Long term (current) use of anticoagulants: Secondary | ICD-10-CM

## 2023-07-27 DIAGNOSIS — I743 Embolism and thrombosis of arteries of the lower extremities: Secondary | ICD-10-CM

## 2023-07-27 LAB — POCT INR: INR: 1.9 — AB (ref 2.0–3.0)

## 2023-07-27 MED ORDER — WARFARIN SODIUM 5 MG PO TABS: ORAL_TABLET | ORAL | 1 refills | Status: DC

## 2023-07-27 MED ORDER — ENOXAPARIN SODIUM 100 MG/ML IJ SOSY: 100.0000 mg | PREFILLED_SYRINGE | INTRAMUSCULAR | 0 refills | Status: DC

## 2023-07-27 NOTE — Progress Notes (Signed)
Pt scheduled for EGD and flex sig on 1/6 and will be placed on a lovenox bridge. Recommend reduced lovenox dosing of 1 mg/kg Q24H; (100 mg Q24H) Increase dose today to take 2 tablets and then change weekly dose to take 1 1/2 tablets daily except take 1 tablet on Tuesday, Thursday, and Saturday until starting instructions below.  1/1: Take last dose of warfarin 1/2: NO warfarin, NO Lovenox 1/3: NO warfarin, Lovenox once in the AM 1/4: NO warfarin, Lovenox once in the AM 1/5: NO warfarin, Lovenox once in the AM(before 7 AM)  1/6: SURGERY; NO WARFARIN (UNLESS ADVISED BY SURGEON TO RESTART), NO LOVENOX  1/7: Take 1 1/2 tablets (7.5 mg) warfarin, Lovenox once in the AM 1/8: Take 2 1/2 tablets (12.5 mg) warfarin, Lovenox once in the AM 1/9: Take 1 1/2 tablets (7.5 mg) warfarin, Lovenox once in the AM 1/10: INR check; Take 2 tablets (10 mg) warfarin, Lovenox once in the AM  Sent in Lovenox injections and warfarin refill.

## 2023-07-27 NOTE — Patient Instructions (Addendum)
Pre visit review using our clinic review tool, if applicable. No additional management support is needed unless otherwise documented below in the visit note.  Increase dose today to take 2 tablets and then change weekly dose to take 1 1/2 tablets daily except take 1 tablet on Tuesday, Thursday, and Saturday until starting instructions below.  1/1: Take last dose of warfarin 1/2: NO warfarin, NO Lovenox 1/3: NO warfarin, Lovenox once in the AM 1/4: NO warfarin, Lovenox once in the AM 1/5: NO warfarin, Lovenox once in the AM(before 7 AM)  1/6: SURGERY; NO WARFARIN (UNLESS ADVISED BY SURGEON TO RESTART), NO LOVENOX  1/7: Take 1 1/2 tablets (7.5 mg) warfarin, Lovenox once in the AM 1/8: Take 2 1/2 tablets (12.5 mg) warfarin, Lovenox once in the AM 1/9: Take 1 1/2 tablets (7.5 mg) warfarin, Lovenox once in the AM 1/10: INR check; Take 2 tablets (10 mg) warfarin, Lovenox once in the AM

## 2023-08-03 ENCOUNTER — Other Ambulatory Visit: Payer: Self-pay | Admitting: Internal Medicine

## 2023-08-03 DIAGNOSIS — I10 Essential (primary) hypertension: Secondary | ICD-10-CM

## 2023-08-13 NOTE — Telephone Encounter (Signed)
 Pt called to report his EGD was cancelled today due to him eating this morning. He said he was confused and did not know he was not supposed to eat this morning.   Advised to take 2 tablets warfarin and then continue the lovenox  bridge instructions given prior. Advised to hold warfarin and lovenox  on 1/10 until after INR check. Advised to let GI know he will need at least one week in advance for rescheduling his EGD so he can be taken off of warfarin again. Advised when rescheduled to let the coumadin  clinic know. Pt verbalized understanding.

## 2023-08-14 ENCOUNTER — Other Ambulatory Visit (INDEPENDENT_AMBULATORY_CARE_PROVIDER_SITE_OTHER): Payer: Medicare Other | Admitting: Pharmacist

## 2023-08-14 DIAGNOSIS — I10 Essential (primary) hypertension: Secondary | ICD-10-CM

## 2023-08-14 NOTE — Progress Notes (Signed)
   08/14/2023 Name: Rick Edwards MRN: [id number] DOB: [birth year]  No chief complaint on file.    Rick Edwards is a 68 y.o. year old male who presented for a telephone visit.   They were referred to the pharmacist by their PCP for assistance in managing diabetes and hypertension.    Subjective:  Care Team: Primary Care Provider: Joshua Debby CROME, MD ; Next Scheduled Visit: 07/26/2023  Medication Access/Adherence  Current Pharmacy:  [phone], [place] - [address] [place] [address] Phone: [phone] Fax: [phone]  [facility] - [place], [place] - [address] [place] [address] Phone: [phone] Fax: [phone]   Patient reports affordability concerns with their medications: No  Patient reports access/transportation concerns to their pharmacy: No  Patient reports adherence concerns with their medications:  No     Hypertension:  Current medications: amlodipine  5 mg 1/2 tab daily, carvedilol  25 mg twice daily, torsemide  20 mg daily  Stopped spironolactone  25 mg after 12/9 appt, stopped olmesartan  12/13 due to hypotension  Patient does not have a validated, automated, upper arm home BP cuff. He goes to the pharmacy to check his BP. Current blood pressure reading: 138/85 at pharmacy   Patient denies hypotensive s/sx including dizziness, lightheadedness. Pt reports feeling very fatigued. Patient states he is drinking a lot of water, staying hydrated. Notes he is urinating normally.   Objective:  BP Readings from Last 3 Encounters:  07/26/23 138/76  07/20/23 (!) 76/55  07/16/23 (!) 80/50     Lab Results  Component Value Date   HGBA1C 5.9 06/12/2023    Lab Results  Component Value Date   CREATININE 1.42 07/26/2023   BUN 31 (H) 07/26/2023   NA 133 (L) 07/26/2023   K 4.3 07/26/2023   CL 98 07/26/2023   CO2 26 07/26/2023    Lab Results  Component Value Date   CHOL  148 12/26/2022   HDL 34.50 (L) 12/26/2022   LDLCALC 91 12/26/2022   LDLDIRECT 97.0 05/05/2019   TRIG 114.0 12/26/2022   CHOLHDL 4 12/26/2022    Medications Reviewed Today   Medications were not reviewed in this encounter       Assessment/Plan:   Hypertension: - BP uncontrolled, BP goal <130/80.  - Increase amlodipine  back to 10 mg daily. Will call in 2 weeks for BP readings.    Follow Up Plan: 1/22  Darrelyn Drum, PharmD, BCPS Clinical Pharmacist [facility] [phone]

## 2023-08-15 MED ORDER — AMLODIPINE BESYLATE 10 MG PO TABS: 10.0000 mg | ORAL_TABLET | Freq: Every day | ORAL | Status: DC

## 2023-08-17 ENCOUNTER — Ambulatory Visit (INDEPENDENT_AMBULATORY_CARE_PROVIDER_SITE_OTHER): Payer: Medicare Other

## 2023-08-17 DIAGNOSIS — Z7901 Long term (current) use of anticoagulants: Secondary | ICD-10-CM

## 2023-08-17 LAB — POCT INR: INR: 1.8 — AB (ref 2.0–3.0)

## 2023-08-17 MED ORDER — ENOXAPARIN SODIUM 100 MG/ML IJ SOSY: 100.0000 mg | PREFILLED_SYRINGE | INTRAMUSCULAR | 0 refills | Status: DC

## 2023-08-17 NOTE — Patient Instructions (Addendum)
 Pre visit review using our clinic review tool, if applicable. No additional management support is needed unless otherwise documented below in the visit note.  Increase dose today to take 2 tablets and then continue 1 tablet daily except take 1 1/2 tablets on Mondays, Wednesdays, and Fridays.  Recheck on 09/11/23. 1/23: Take last dose of warfarin 1/24: NO warfarin, NO Lovenox  1/25: NO warfarin, inject Lovenox  once in the AM 1/26: NO warfarin, inject Lovenox  once in the AM 1/27: NO warfarin, inject Lovenox  once in the AM BEFORE 7 AM   1/28: SURGERY; NO WARFARIN, NO LOVENOX    1/29: Take 2 tablets (10 mg) warfarin, inject Lovenox  once in the AM 1/30: Take 1 1/2 tablets (7.5 mg) warfarin, inject Lovenox  once in the AM 1/31: Take 2 1/2 tablets (12.5 mg) warfarin, inject Lovenox  once in the AM 2/1: Take 1 1/2 tablets (7.5 mg) warfarin, inject Lovenox  once in the AM 2/2: Take 1 tablet (5 mg) warfarin, inject Lovenox  once in the AM 2/3: Take 1 1/2 tablets (7.5 mg) warfarin, inject Lovenox  once in the AM 2/4: Recheck INR; NO WARFARIN AND NO LOVENOX  UNTIL INR CHECK

## 2023-08-17 NOTE — Progress Notes (Signed)
 Pt has flex sig endoscopy on 1/28, which was rescheduled from 1/6 due to pt eating the morning of the procedure. Pt completed a lovenox  bridge for that prior to prepare for procedure.  Recommend reduced lovenox  dosing of 1 mg/kg Q24H; (100 mg Q24H)  Increase dose today to take 2 tablets and then continue 1 tablet daily except take 1 1/2 tablets on Mondays, Wednesdays, and Fridays.  Recheck on 09/11/23. 1/23: Take last dose of warfarin 1/24: NO warfarin, NO Lovenox  1/25: NO warfarin, inject Lovenox  once in the AM 1/26: NO warfarin, inject Lovenox  once in the AM 1/27: NO warfarin, inject Lovenox  once in the AM BEFORE 7 AM   1/28: SURGERY; NO WARFARIN, NO LOVENOX    1/29: Take 2 tablets (10 mg) warfarin, inject Lovenox  once in the AM 1/30: Take 1 1/2 tablets (7.5 mg) warfarin, inject Lovenox  once in the AM 1/31: Take 2 1/2 tablets (12.5 mg) warfarin, inject Lovenox  once in the AM 2/1: Take 1 1/2 tablets (7.5 mg) warfarin, inject Lovenox  once in the AM 2/2: Take 1 tablet (5 mg) warfarin, inject Lovenox  once in the AM 2/3: Take 1 1/2 tablets (7.5 mg) warfarin, inject Lovenox  once in the AM 2/4: Recheck INR; NO WARFARIN AND NO LOVENOX  UNTIL INR CHECK  Pt reported he has 5 Lovenox  syringes left. He did not use them after the procedure as advised. He was confused.  He will only need an additional 4 syringes for this Lovenox  bridge. Advised pt if any questions to contact coumadin  clinic. Pt verbalized understanding.  Sent in additional Lovenox  syringes to pharmacy.

## 2023-08-17 NOTE — Telephone Encounter (Signed)
 Updated Lovenox  bridge schedule for procedure on 1/28.  1/23: Take last dose of warfarin 1/24: NO warfarin, NO Lovenox  1/25: NO warfarin, inject Lovenox  once in the AM 1/26: NO warfarin, inject Lovenox  once in the AM 1/27: NO warfarin, inject Lovenox  once in the AM BEFORE 7 AM  1/28: SURGERY; NO WARFARIN, NO LOVENOX   1/29: Take 2 tablets (10 mg) warfarin, inject Lovenox  once in the AM 1/30: Take 1 1/2 tablets (7.5 mg) warfarin, inject Lovenox  once in the AM 1/31: Take 2 1/2 tablets (12.5 mg) warfarin, inject Lovenox  once in the AM 2/1: Take 1 1/2 tablets (7.5 mg) warfarin, inject Lovenox  once in the AM 2/2: Take 1 tablet (5 mg) warfarin, inject Lovenox  once in the AM 2/3: Take 1 1/2 tablets (7.5 mg) warfarin, inject Lovenox  once in the AM 2/4: Recheck INR; NO WARFARIN AND NO LOVENOX  UNTIL INR CHECK  Pt will need a new script for Lovenox  syringes.

## 2023-08-17 NOTE — Telephone Encounter (Signed)
 Faxed [facility] paperwork back concerning anticoagulation management around procedure.

## 2023-08-19 ENCOUNTER — Ambulatory Visit
Admission: RE | Admit: 2023-08-19 | Discharge: 2023-08-19 | Disposition: A | Discharge status: Home / Self Care | Payer: Medicare Other | Source: Ambulatory Visit | Attending: Internal Medicine | Admitting: Internal Medicine

## 2023-08-19 DIAGNOSIS — R3121 Asymptomatic microscopic hematuria: Secondary | ICD-10-CM

## 2023-08-19 DIAGNOSIS — K439 Ventral hernia without obstruction or gangrene: Secondary | ICD-10-CM | POA: Diagnosis not present

## 2023-08-19 DIAGNOSIS — N281 Cyst of kidney, acquired: Secondary | ICD-10-CM | POA: Diagnosis not present

## 2023-08-19 DIAGNOSIS — N2889 Other specified disorders of kidney and ureter: Secondary | ICD-10-CM

## 2023-08-19 DIAGNOSIS — R109 Unspecified abdominal pain: Secondary | ICD-10-CM | POA: Diagnosis not present

## 2023-08-19 MED ORDER — GADOPICLENOL 0.5 MMOL/ML IV SOLN
9.0000 mL | Freq: Once | INTRAVENOUS | Status: AC | PRN
  Administered 2023-08-19: 9 mL via INTRAVENOUS

## 2023-08-24 ENCOUNTER — Other Ambulatory Visit: Payer: Self-pay | Admitting: Internal Medicine

## 2023-08-24 DIAGNOSIS — N2889 Other specified disorders of kidney and ureter: Secondary | ICD-10-CM | POA: Insufficient documentation

## 2023-08-29 ENCOUNTER — Other Ambulatory Visit (INDEPENDENT_AMBULATORY_CARE_PROVIDER_SITE_OTHER): Payer: Medicare Other | Admitting: Pharmacist

## 2023-08-29 VITALS — BP 130/80

## 2023-08-29 DIAGNOSIS — I10 Essential (primary) hypertension: Secondary | ICD-10-CM

## 2023-08-29 NOTE — Progress Notes (Signed)
   08/29/2023 Name: Rick Edwards MRN: [id number] DOB: [birth year]  No chief complaint on file.    Rick Edwards is a 68 y.o. year old male who presented for a telephone visit.   They were referred to the pharmacist by their PCP for assistance in managing diabetes and hypertension.    Subjective:  Care Team: Primary Care Provider: Etta Grandchild, MD ; Next Scheduled Visit: 07/26/2023  Medication Access/Adherence  Current Pharmacy:  [phone] [place], [place] - [address] [place] [address] Phone: [phone] Fax: [phone]  [facility] - [place], [place] - [address] [place] [address] Phone: [phone] Fax: [phone]   Patient reports affordability concerns with their medications: No  Patient reports access/transportation concerns to their pharmacy: No  Patient reports adherence concerns with their medications:  No     Hypertension:  Current medications: amlodipine 10 mg daily, carvedilol 25 mg twice daily, torsemide 20 mg daily  Stopped spironolactone 25 mg after 12/9 appt, stopped olmesartan 12/13 due to hypotension  Patient does not have a validated, automated, upper arm home BP cuff. He goes to the pharmacy to check his BP. Current blood pressure reading:  1/12 129/75 1/14 134/70 1/15 132/89 1/19 124/87 1/22 130/80   Patient denies hypotensive s/sx including dizziness, lightheadedness. Pt reports feeling very fatigued.    Objective:  BP Readings from Last 3 Encounters:  08/15/23 138/85  07/26/23 138/76  07/20/23 (!) 76/55     Lab Results  Component Value Date   HGBA1C 5.9 06/12/2023    Lab Results  Component Value Date   CREATININE 1.42 07/26/2023   BUN 31 (H) 07/26/2023   NA 133 (L) 07/26/2023   K 4.3 07/26/2023   CL 98 07/26/2023   CO2 26 07/26/2023    Lab Results  Component Value Date   CHOL 148 12/26/2022   HDL 34.50 (L) 12/26/2022   LDLCALC 91  12/26/2022   LDLDIRECT 97.0 05/05/2019   TRIG 114.0 12/26/2022   CHOLHDL 4 12/26/2022    Medications Reviewed Today   Medications were not reviewed in this encounter       Assessment/Plan:   Hypertension: - BP better controlled, BP goal <130/80. - Continue current regimen. Call if any concerns  Provided urology number for pt to call and get referral scheduled    Follow Up Plan: PRN  Arbutus Leas, PharmD, BCPS Clinical Pharmacist [facility] [phone]

## 2023-09-01 ENCOUNTER — Other Ambulatory Visit: Payer: Self-pay | Admitting: Internal Medicine

## 2023-09-01 DIAGNOSIS — I10 Essential (primary) hypertension: Secondary | ICD-10-CM

## 2023-09-03 ENCOUNTER — Telehealth: Payer: Self-pay

## 2023-09-03 ENCOUNTER — Other Ambulatory Visit: Payer: Self-pay

## 2023-09-03 DIAGNOSIS — Z7901 Long term (current) use of anticoagulants: Secondary | ICD-10-CM

## 2023-09-03 MED ORDER — ENOXAPARIN SODIUM 100 MG/ML IJ SOSY: 100.0000 mg | PREFILLED_SYRINGE | INTRAMUSCULAR | 0 refills | Status: DC

## 2023-09-03 NOTE — Telephone Encounter (Signed)
Pt reports he has taken 3 Lovenox injections before his procedure tomorrow and per his instructions he is supposed to use 6 syringes after the procedure and before INR check. Pt reports he only has 5 syringes left. Advised this nurse will send in a new script for 1 syringes. Pt verbalized understanding.   Sent in script for one syringe.

## 2023-09-04 DIAGNOSIS — K2930 Chronic superficial gastritis without bleeding: Secondary | ICD-10-CM | POA: Diagnosis not present

## 2023-09-04 DIAGNOSIS — K449 Diaphragmatic hernia without obstruction or gangrene: Secondary | ICD-10-CM | POA: Diagnosis not present

## 2023-09-04 DIAGNOSIS — K5090 Crohn's disease, unspecified, without complications: Secondary | ICD-10-CM | POA: Diagnosis not present

## 2023-09-04 DIAGNOSIS — K222 Esophageal obstruction: Secondary | ICD-10-CM | POA: Diagnosis not present

## 2023-09-04 DIAGNOSIS — K317 Polyp of stomach and duodenum: Secondary | ICD-10-CM | POA: Diagnosis not present

## 2023-09-04 DIAGNOSIS — Z980 Intestinal bypass and anastomosis status: Secondary | ICD-10-CM | POA: Diagnosis not present

## 2023-09-04 LAB — HM SIGMOIDOSCOPY

## 2023-09-06 DIAGNOSIS — K2930 Chronic superficial gastritis without bleeding: Secondary | ICD-10-CM | POA: Diagnosis not present

## 2023-09-11 ENCOUNTER — Ambulatory Visit (INDEPENDENT_AMBULATORY_CARE_PROVIDER_SITE_OTHER): Payer: Medicare Other

## 2023-09-11 DIAGNOSIS — Z7901 Long term (current) use of anticoagulants: Secondary | ICD-10-CM

## 2023-09-11 LAB — POCT INR: INR: 1.8 — AB (ref 2.0–3.0)

## 2023-09-11 NOTE — Progress Notes (Addendum)
Pt had flex sig endoscopy on 1/6. Pt was placed on a lovenox bridge. Stop lovenox injections. Increase dose today to take 1 1/2 tablets and then change weekly to take 1 1/2 tablets daily except take 1 tablet on Tuesday and Saturday.  Recheck in 2 weeks.

## 2023-09-11 NOTE — Patient Instructions (Addendum)
 Pre visit review using our clinic review tool, if applicable. No additional management support is needed unless otherwise documented below in the visit note.  Increase dose today to take 1 1/2 tablets and then change weekly to take 1 1/2 tablets daily except take 1 tablet on Tuesday and Saturday.  Recheck in 2 weeks.

## 2023-09-24 DIAGNOSIS — N486 Induration penis plastica: Secondary | ICD-10-CM | POA: Diagnosis not present

## 2023-09-24 DIAGNOSIS — N281 Cyst of kidney, acquired: Secondary | ICD-10-CM | POA: Diagnosis not present

## 2023-09-24 DIAGNOSIS — R972 Elevated prostate specific antigen [PSA]: Secondary | ICD-10-CM | POA: Diagnosis not present

## 2023-09-25 ENCOUNTER — Ambulatory Visit (INDEPENDENT_AMBULATORY_CARE_PROVIDER_SITE_OTHER): Payer: Medicare Other

## 2023-09-25 DIAGNOSIS — Z7901 Long term (current) use of anticoagulants: Secondary | ICD-10-CM | POA: Diagnosis not present

## 2023-09-25 LAB — HM COLONOSCOPY

## 2023-09-25 LAB — POCT INR: INR: 1.8 — AB (ref 2.0–3.0)

## 2023-09-25 NOTE — Patient Instructions (Addendum)
 Pre visit review using our clinic review tool, if applicable. No additional management support is needed unless otherwise documented below in the visit note.  Increase dose today to take 1 1/2 tablets and then change weekly to take 1 1/2 tablets daily except take 1 tablet on Tuesday.  Recheck in 2 weeks.

## 2023-09-25 NOTE — Progress Notes (Signed)
 Increase dose today to take 1 1/2 tablets and then change weekly to take 1 1/2 tablets daily except take 1 tablet on Tuesday.  Recheck in 2 weeks.

## 2023-09-29 ENCOUNTER — Other Ambulatory Visit: Payer: Self-pay | Admitting: Internal Medicine

## 2023-09-29 DIAGNOSIS — I10 Essential (primary) hypertension: Secondary | ICD-10-CM

## 2023-09-29 DIAGNOSIS — E876 Hypokalemia: Secondary | ICD-10-CM

## 2023-10-03 ENCOUNTER — Other Ambulatory Visit: Payer: Self-pay | Admitting: Gastroenterology

## 2023-10-04 ENCOUNTER — Telehealth: Payer: Self-pay

## 2023-10-04 NOTE — Telephone Encounter (Signed)
 Copied from CRM [id number]. Topic: Clinical - Prescription Issue >> Oct 03, 2023  5:02 PM [place] J wrote: Reason for CRM: Patient would like to know why it is not appropriate to take potassium chloride anymore and would like a clear answer as to if he needs the medication or not.

## 2023-10-05 NOTE — Telephone Encounter (Signed)
**Note De-identified  [place] Obfuscation** Please advise 

## 2023-10-09 ENCOUNTER — Ambulatory Visit (INDEPENDENT_AMBULATORY_CARE_PROVIDER_SITE_OTHER): Payer: Medicare Other

## 2023-10-09 DIAGNOSIS — Z7901 Long term (current) use of anticoagulants: Secondary | ICD-10-CM | POA: Diagnosis not present

## 2023-10-09 LAB — POCT INR: INR: 3.2 — AB (ref 2.0–3.0)

## 2023-10-09 NOTE — Patient Instructions (Addendum)
 Pre visit review using our clinic review tool, if applicable. No additional management support is needed unless otherwise documented below in the visit note.  Hold dose today and then continue 1 1/2 tablets daily except take 1 tablet on Tuesday.  Recheck in 3 weeks.

## 2023-10-09 NOTE — Progress Notes (Signed)
 Pt is scheduled for an upper esophageal [place] for 5/7. Pt will need a lovenox bridge. Medications stopped by PCP since last visit are potassium, olmesartan, and spironolactone. Hold dose today and then continue 1 1/2 tablets daily except take 1 tablet on Tuesday.  Recheck in 3 weeks.

## 2023-10-12 ENCOUNTER — Telehealth: Payer: Self-pay

## 2023-10-12 NOTE — Telephone Encounter (Addendum)
 Pt reported at last coumadin clinic apt that he is scheduled for upper esophageal [place] for 5/7.  Pt will need lovenox bridge around procedure.  Actual wt; 92.6 kg (133% > ideal) Ideal wt; 70 kg Adjusted wt; 79 kg  CrCl; 57.95 mL/min using adjusted wt Kidney function improved since last Lovenox bridge; creatinine 1.42 on labs from 07/25/24. Creatinine clearance ok for normal dosing of 1.5 mg/kg daily.  Current warfarin dosing; 7.5 mg( 1 1/2 tablets) daily except take 5 mg (1 tablet) on Tuesday  Recommend Lovenox 150 mg once daily. (8 syringes)  Lovenox bridge schedule; may need to adjust warfarin dosing if weekly dosing changes before starting bridge  5/2: Take last dose of warfarin 5/3: NO warfarin, NO Lovenox 5/4: NO warfarin, inject Lovenox once in the AM 5/5: NO warfarin, inject Lovenox once in the AM 5/6: NO warfarin, inject Lovenox once in the AM (BEFORE 7 AM)  5/7:SURGERY; NO WARFARIN, NO LOVENOX  5/8: Take 2 tablets (10 mg) warfarin, inject Lovenox once in the AM 5/9: Take 1 1/2 tablets (7.5 mg) warfarin, inject Lovenox once in the AM 5/10: Take 2 tablets (10 mg) warfarin, inject Lovenox once in the AM 5/11: Take 2 1/2 tablets (12.5 mg) warfarin, inject Lovenox once in the AM 5/12: Take 1 1/2 tablets (7.5 ,g) warfarin, inject Lovenox once in the AM 5/13: Recheck INR; NO WARFARIN AND NO LOVENOX UNTIL AFTER INR CHECK  Updated dosing to reflect change in weekly dosing at coumadin clinic apt on 3/25.

## 2023-10-24 ENCOUNTER — Encounter: Payer: Self-pay | Admitting: Internal Medicine

## 2023-10-24 ENCOUNTER — Ambulatory Visit: Payer: Medicare Other | Admitting: Internal Medicine

## 2023-10-24 VITALS — BP 134/78 | HR 60 | Temp 97.7°F | Ht 69.0 in | Wt 210.0 lb

## 2023-10-24 DIAGNOSIS — I10 Essential (primary) hypertension: Secondary | ICD-10-CM | POA: Diagnosis not present

## 2023-10-24 DIAGNOSIS — E871 Hypo-osmolality and hyponatremia: Secondary | ICD-10-CM

## 2023-10-24 DIAGNOSIS — N2889 Other specified disorders of kidney and ureter: Secondary | ICD-10-CM | POA: Diagnosis not present

## 2023-10-24 DIAGNOSIS — E876 Hypokalemia: Secondary | ICD-10-CM | POA: Diagnosis not present

## 2023-10-24 DIAGNOSIS — N400 Enlarged prostate without lower urinary tract symptoms: Secondary | ICD-10-CM

## 2023-10-24 DIAGNOSIS — E785 Hyperlipidemia, unspecified: Secondary | ICD-10-CM | POA: Diagnosis not present

## 2023-10-24 DIAGNOSIS — R972 Elevated prostate specific antigen [PSA]: Secondary | ICD-10-CM

## 2023-10-24 LAB — HEPATIC FUNCTION PANEL
ALT: 19 U/L (ref 0–53)
AST: 17 U/L (ref 0–37)
Albumin: 4.5 g/dL (ref 3.5–5.2)
Alkaline Phosphatase: 95 U/L (ref 39–117)
Bilirubin, Direct: 0.2 mg/dL (ref 0.0–0.3)
Total Bilirubin: 1.1 mg/dL (ref 0.2–1.2)
Total Protein: 7.5 g/dL (ref 6.0–8.3)

## 2023-10-24 LAB — CBC WITH DIFFERENTIAL/PLATELET
Basophils Absolute: 0.1 10*3/uL (ref 0.0–0.1)
Basophils Relative: 1 % (ref 0.0–3.0)
Eosinophils Absolute: 0.2 10*3/uL (ref 0.0–0.7)
Eosinophils Relative: 2.6 % (ref 0.0–5.0)
HCT: 47.5 % (ref 39.0–52.0)
Hemoglobin: 16 g/dL (ref 13.0–17.0)
Lymphocytes Relative: 32.5 % (ref 12.0–46.0)
Lymphs Abs: 2 10*3/uL (ref 0.7–4.0)
MCHC: 33.6 g/dL (ref 30.0–36.0)
MCV: 92 fl (ref 78.0–100.0)
Monocytes Absolute: 0.7 10*3/uL (ref 0.1–1.0)
Monocytes Relative: 10.8 % (ref 3.0–12.0)
Neutro Abs: 3.3 10*3/uL (ref 1.4–7.7)
Neutrophils Relative %: 53.1 % (ref 43.0–77.0)
Platelets: 349 10*3/uL (ref 150.0–400.0)
RBC: 5.16 Mil/uL (ref 4.22–5.81)
RDW: 14.8 % (ref 11.5–15.5)
WBC: 6.2 10*3/uL (ref 4.0–10.5)

## 2023-10-24 LAB — URINALYSIS, ROUTINE W REFLEX MICROSCOPIC
Bilirubin Urine: NEGATIVE
Hgb urine dipstick: NEGATIVE
Ketones, ur: NEGATIVE
Leukocytes,Ua: NEGATIVE
Nitrite: NEGATIVE
RBC / HPF: NONE SEEN (ref 0–?)
Specific Gravity, Urine: <=1.005 — AB (ref 1.000–1.030)
Total Protein, Urine: NEGATIVE
Urine Glucose: NEGATIVE
Urobilinogen, UA: 0.2 (ref 0.0–1.0)
WBC, UA: NONE SEEN (ref 0–?)
pH: 6 (ref 5.0–8.0)

## 2023-10-24 LAB — BASIC METABOLIC PANEL
BUN: 13 mg/dL (ref 6–23)
CO2: 33 meq/L — ABNORMAL HIGH (ref 19–32)
Calcium: 9.3 mg/dL (ref 8.4–10.5)
Chloride: 97 meq/L (ref 96–112)
Creatinine, Ser: 1.01 mg/dL (ref 0.40–1.50)
GFR: 78.12 mL/min (ref >60.00)
Glucose, Bld: 100 mg/dL — ABNORMAL HIGH (ref 70–99)
Potassium: 3.4 meq/L — ABNORMAL LOW (ref 3.5–5.1)
Sodium: 137 meq/L (ref 135–145)

## 2023-10-24 LAB — TSH: TSH: 1.12 u[IU]/mL (ref 0.35–5.50)

## 2023-10-24 NOTE — Patient Instructions (Signed)
 Hypertension, Adult High blood pressure (hypertension) is when the force of blood pumping through the arteries is too strong. The arteries are the blood vessels that carry blood from the heart throughout the body. Hypertension forces the heart to work harder to pump blood and may cause arteries to become narrow or stiff. Untreated or uncontrolled hypertension can lead to a heart attack, heart failure, a stroke, kidney disease, and other problems. A blood pressure reading consists of a higher number over a lower number. Ideally, your blood pressure should be below 120/80. The first ("top") number is called the systolic pressure. It is a measure of the pressure in your arteries as your heart beats. The second ("bottom") number is called the diastolic pressure. It is a measure of the pressure in your arteries as the heart relaxes. What are the causes? The exact cause of this condition is not known. There are some conditions that result in high blood pressure. What increases the risk? Certain factors may make you more likely to develop high blood pressure. Some of these risk factors are under your control, including: Smoking. Not getting enough exercise or physical activity. Being overweight. Having too much fat, sugar, calories, or salt (sodium) in your diet. Drinking too much alcohol. Other risk factors include: Having a personal history of heart disease, diabetes, high cholesterol, or kidney disease. Stress. Having a family history of high blood pressure and high cholesterol. Having obstructive sleep apnea. Age. The risk increases with age. What are the signs or symptoms? High blood pressure may not cause symptoms. Very high blood pressure (hypertensive crisis) may cause: Headache. Fast or irregular heartbeats (palpitations). Shortness of breath. Nosebleed. Nausea and vomiting. Vision changes. Severe chest pain, dizziness, and seizures. How is this diagnosed? This condition is diagnosed by  measuring your blood pressure while you are seated, with your arm resting on a flat surface, your legs uncrossed, and your feet flat on the floor. The cuff of the blood pressure monitor will be placed directly against the skin of your upper arm at the level of your heart. Blood pressure should be measured at least twice using the same arm. Certain conditions can cause a difference in blood pressure between your right and left arms. If you have a high blood pressure reading during one visit or you have normal blood pressure with other risk factors, you may be asked to: Return on a different day to have your blood pressure checked again. Monitor your blood pressure at home for 1 week or longer. If you are diagnosed with hypertension, you may have other blood or imaging tests to help your health care provider understand your overall risk for other conditions. How is this treated? This condition is treated by making healthy lifestyle changes, such as eating healthy foods, exercising more, and reducing your alcohol intake. You may be referred for counseling on a healthy diet and physical activity. Your health care provider may prescribe medicine if lifestyle changes are not enough to get your blood pressure under control and if: Your systolic blood pressure is above 130. Your diastolic blood pressure is above 80. Your personal target blood pressure may vary depending on your medical conditions, your age, and other factors. Follow these instructions at home: Eating and drinking  Eat a diet that is high in fiber and potassium, and low in sodium, added sugar, and fat. An example of this eating plan is called the DASH diet. DASH stands for Dietary Approaches to Stop Hypertension. To eat this way: Eat  plenty of fresh fruits and vegetables. Try to fill one half of your plate at each meal with fruits and vegetables. Eat whole grains, such as whole-wheat pasta, brown rice, or whole-grain bread. Fill about one  fourth of your plate with whole grains. Eat or drink low-fat dairy products, such as skim milk or low-fat yogurt. Avoid fatty cuts of meat, processed or cured meats, and poultry with skin. Fill about one fourth of your plate with lean proteins, such as fish, chicken without skin, beans, eggs, or tofu. Avoid pre-made and processed foods. These tend to be higher in sodium, added sugar, and fat. Reduce your daily sodium intake. Many people with hypertension should eat less than 1,500 mg of sodium a day. Do not drink alcohol if: Your health care provider tells you not to drink. You are pregnant, may be pregnant, or are planning to become pregnant. If you drink alcohol: Limit how much you have to: 0-1 drink a day for women. 0-2 drinks a day for men. Know how much alcohol is in your drink. In the [place] one drink equals one 12 oz bottle of beer (355 mL), one 5 oz glass of wine (148 mL), or one 1 oz glass of hard liquor (44 mL). Lifestyle  Work with your health care provider to maintain a healthy body weight or to lose weight. Ask what an ideal weight is for you. Get at least 30 minutes of exercise that causes your heart to beat faster (aerobic exercise) most days of the week. Activities may include walking, swimming, or biking. Include exercise to strengthen your muscles (resistance exercise), such as Pilates or lifting weights, as part of your weekly exercise routine. Try to do these types of exercises for 30 minutes at least 3 days a week. Do not use any products that contain nicotine or tobacco. These products include cigarettes, chewing tobacco, and vaping devices, such as e-cigarettes. If you need help quitting, ask your health care provider. Monitor your blood pressure at home as told by your health care provider. Keep all follow-up visits. This is important. Medicines Take over-the-counter and prescription medicines only as told by your health care provider. Follow directions carefully. Blood  pressure medicines must be taken as prescribed. Do not skip doses of blood pressure medicine. Doing this puts you at risk for problems and can make the medicine less effective. Ask your health care provider about side effects or reactions to medicines that you should watch for. Contact a health care provider if you: Think you are having a reaction to a medicine you are taking. Have headaches that keep coming back (recurring). Feel dizzy. Have swelling in your ankles. Have trouble with your vision. Get help right away if you: Develop a severe headache or confusion. Have unusual weakness or numbness. Feel faint. Have severe pain in your chest or abdomen. Vomit repeatedly. Have trouble breathing. These symptoms may be an emergency. Get help right away. Call 911. Do not wait to see if the symptoms will go away. Do not drive yourself to the hospital. Summary Hypertension is when the force of blood pumping through your arteries is too strong. If this condition is not controlled, it may put you at risk for serious complications. Your personal target blood pressure may vary depending on your medical conditions, your age, and other factors. For most people, a normal blood pressure is less than 120/80. Hypertension is treated with lifestyle changes, medicines, or a combination of both. Lifestyle changes include losing weight, eating a healthy,  low-sodium diet, exercising more, and limiting alcohol. This information is not intended to replace advice given to you by your health care provider. Make sure you discuss any questions you have with your health care provider. Document Revised: 05/31/2021 Document Reviewed: 05/31/2021 Elsevier Patient Education  2024 [employer].

## 2023-10-24 NOTE — Progress Notes (Unsigned)
 Subjective:  Patient ID: Rick Edwards, male    DOB: 21-Sep-1955  Age: 68 y.o. MRN: [id number]  CC: Hypertension   HPI [name] presents for f/up ---  Discussed the use of AI scribe software for clinical note transcription with the patient, who gave verbal consent to proceed.  History of Present Illness   [name] is a 68 year old male who presents for a follow-up visit.  He recently saw a urologist and there is a plan to biopsy his kidneys. Details about the timing or specific reason for the biopsy are not provided.  He recalls a recent visit to a gastroenterologist where a procedure involving his throat was performed. He underwent a colonoscopy within the last one to two months, and states that everything was okay following the procedure.  No chest pain, shortness of breath, dizziness, lightheadedness, or swelling in his legs or feet.       Outpatient Medications Prior to Visit  Medication Sig Dispense Refill   allopurinol (ZYLOPRIM) 300 MG tablet TAKE 1 TABLET BY MOUTH EVERY DAY 90 tablet 1   amLODipine (NORVASC) 10 MG tablet Take 1 tablet (10 mg total) by mouth daily.     atorvastatin (LIPITOR) 40 MG tablet TAKE 1 TABLET BY MOUTH EVERY DAY 90 tablet 1   carvedilol (COREG) 25 MG tablet TAKE 1 TABLET (25 MG TOTAL) BY MOUTH TWICE A DAY WITH MEALS 180 tablet 1   ciclopirox (LOPROX) 0.77 % cream Apply topically 2 (two) times daily. 90 g 2   enoxaparin (LOVENOX) 100 MG/ML injection Inject 1 mL (100 mg total) into the skin daily. INJECT AS DIRECTED BY ANTICOAGULATION CLINIC 1 mL 0   hyoscyamine (LEVSIN SL) 0.125 MG SL tablet Place under the tongue 2 (two) times daily as needed.     torsemide (DEMADEX) 20 MG tablet TAKE 1 TABLET BY MOUTH EVERY DAY 90 tablet 0   triamcinolone cream (KENALOG) 0.5 % Apply 1 Application topically 3 (three) times daily. 30 g 2   VASCEPA 1 g capsule Take 2 capsules (2 g total) by mouth 2 (two) times daily. 360 capsule 1    warfarin (COUMADIN) 5 MG tablet TAKE 1 1/2 TABLETS BY MOUTH DAILY EXCEPT TAKE 1 TABLET ON TUESDAY, THURSDAY AND SATURDAY OR AS DIRECTED BY CLINIC. 120 tablet 1   No facility-administered medications prior to visit.    ROS Review of Systems  Constitutional:  Negative for chills, fatigue and unexpected weight change.  HENT: Negative.    Respiratory:  Negative for cough, chest tightness, shortness of breath and wheezing.   Cardiovascular:  Negative for chest pain, palpitations and leg swelling.  Gastrointestinal:  Negative for abdominal pain, constipation, diarrhea, nausea and vomiting.  Endocrine: Negative.   Genitourinary: Negative.  Negative for difficulty urinating.  Musculoskeletal: Negative.  Negative for arthralgias.  Skin: Negative.  Negative for color change.  Neurological:  Negative for dizziness and weakness.  Hematological:  Negative for adenopathy. Does not bruise/bleed easily.    Objective:  BP 134/78 (BP Location: Left Arm, Patient Position: Sitting)   Pulse 60   Temp 97.7 F (36.5 C) (Temporal)   Ht 5\' 9"  (1.753 m)   Wt 210 lb (95.3 kg)   SpO2 97%   BMI 31.01 kg/m   BP Readings from Last 3 Encounters:  10/24/23 134/78  08/29/23 130/80  08/15/23 138/85    Wt Readings from Last 3 Encounters:  10/24/23 210 lb (95.3 kg)  07/26/23 204 lb 3.2 oz (  92.6 kg)  07/16/23 200 lb (90.7 kg)    Physical Exam Vitals reviewed.  Constitutional:      Appearance: Normal appearance.  HENT:     Mouth/Throat:     Mouth: Mucous membranes are moist.  Eyes:     General: No scleral icterus.    Conjunctiva/sclera: Conjunctivae normal.  Cardiovascular:     Rate and Rhythm: Normal rate and regular rhythm.     Heart sounds: No murmur heard.    No friction rub. No gallop.  Pulmonary:     Effort: Pulmonary effort is normal.     Breath sounds: No stridor. No wheezing, rhonchi or rales.  Abdominal:     General: Abdomen is flat.     Palpations: There is no mass.     Tenderness:  There is no abdominal tenderness. There is no guarding.     Hernia: No hernia is present.  Musculoskeletal:        General: Normal range of motion.     Cervical back: Neck supple.     Right lower leg: No edema.     Left lower leg: No edema.  Lymphadenopathy:     Cervical: No cervical adenopathy.  Skin:    General: Skin is dry.  Neurological:     General: No focal deficit present.     Mental Status: He is alert.  Psychiatric:        Mood and Affect: Mood normal.        Behavior: Behavior normal.     Lab Results  Component Value Date   WBC 6.2 10/24/2023   HGB 16.0 10/24/2023   HCT 47.5 10/24/2023   PLT 349.0 10/24/2023   GLUCOSE 100 (H) 10/24/2023   CHOL 148 12/26/2022   TRIG 114.0 12/26/2022   HDL 34.50 (L) 12/26/2022   LDLDIRECT 97.0 05/05/2019   LDLCALC 91 12/26/2022   ALT 19 10/24/2023   AST 17 10/24/2023   NA 137 10/24/2023   K 3.4 (L) 10/24/2023   CL 97 10/24/2023   CREATININE 1.01 10/24/2023   BUN 13 10/24/2023   CO2 33 (H) 10/24/2023   TSH 1.12 10/24/2023   PSA 4.79 (H) 12/26/2022   INR 3.2 (A) 10/09/2023   HGBA1C 5.9 06/12/2023   MICROALBUR 16.0 (H) 06/12/2023    MR Abdomen W Wo Contrast Result Date: 08/23/2023 CLINICAL DATA:  Abdominal pain, characterize bilateral renal lesions EXAM: MRI ABDOMEN WITHOUT AND WITH CONTRAST TECHNIQUE: Multiplanar multisequence MR imaging of the abdomen was performed both before and after the administration of intravenous contrast. CONTRAST:  9 mL Vueway gadolinium contrast IV COMPARISON:  CT abdomen pelvis, 07/13/2023 FINDINGS: Lower chest: No acute abnormality. Hepatobiliary: Multiple benign fluid signal liver cysts, for which no further follow-up or characterization required. Contracted gallbladder. No biliary ductal dilatation. Pancreas: Unremarkable. No pancreatic ductal dilatation or surrounding inflammatory changes. Spleen: Normal in size without significant abnormality. Adrenals/Urinary Tract: Adrenal glands are  unremarkable. Partially exophytic lesion arising from the lateral midportion of the right kidney measuring 1.8 x 1.6 cm, with thick, enhancing internal septations (series 16, image 33, series 18, image 34). Numerous additional simple fluid signal and hemorrhagic or proteinaceous renal cortical cysts of varying sizes, these lesions benign and requiring no specific further follow-up or characterization. No obvious calculi or hydronephrosis. Stomach/Bowel: Stomach is within normal limits. No evidence of bowel wall thickening, distention, or inflammatory changes. Vascular/Lymphatic: No significant vascular findings are present. No enlarged abdominal lymph nodes. Other: Midline ventral hernia containing fat and a nonobstructed loop  of mid small bowel (series 9, image 26). No ascites. Musculoskeletal: No acute or significant osseous findings. IMPRESSION: 1. Partially exophytic lesion arising from the lateral midportion of the right kidney measuring 1.8 x 1.6 cm, with thick, enhancing internal septations. This is a Bosniak category III lesion, suspicious for a small cystic renal cell carcinoma. Bosniak III masses have intermediate probability of being malignant. If not already obtained, consider seeking urology consultation. (Reference: Bosniak Classification of Cystic Renal Masses, Version 2019. Radiology 2019; 292(2): 475-488.) 2. Additional simple and hemorrhagic or proteinaceous benign bilateral renal cortical cysts, for which no specific further characterization is required. 3. No evidence of lymphadenopathy or metastatic disease in the abdomen. 4. Midline ventral hernia containing fat and a nonobstructed loop of mid small bowel. Electronically Signed   By: Jearld Lesch M.D.   On: 08/23/2023 17:49    Assessment & Plan:   Hyperlipidemia with target LDL less than 70- LDL goal achieved. Doing well on the statin  -     TSH; Future -     Hepatic function panel; Future  Essential hypertension, malignant- K+ is  low. Will restart spironolactone. -     Basic metabolic panel; Future -     CBC with Differential/Platelet; Future -     Urinalysis, Routine w reflex microscopic; Future -     TSH; Future -     Spironolactone; Take 1 tablet (25 mg total) by mouth daily.  Dispense: 90 tablet; Refill: 3 -     AMB Referral [facility] Care Management  Hyponatremia -     Basic metabolic panel; Future -     Sodium, urine, random; Future  Right renal mass -     Urinalysis, Routine w reflex microscopic; Future -     Ambulatory referral to Urology  Elevated PSA -     Ambulatory referral to Urology  Chronic hypokalemia -     Spironolactone; Take 1 tablet (25 mg total) by mouth daily.  Dispense: 90 tablet; Refill: 3 -     AMB Referral [facility]BCI Care Management     Follow-up: Return in about 6 months (around 04/25/2024).  Sanda Linger, MD

## 2023-10-25 LAB — SODIUM, URINE, RANDOM: Sodium, Ur: 81 mmol/L (ref 28–272)

## 2023-10-26 DIAGNOSIS — E876 Hypokalemia: Secondary | ICD-10-CM | POA: Insufficient documentation

## 2023-10-26 MED ORDER — SPIRONOLACTONE 25 MG PO TABS: 25.0000 mg | ORAL_TABLET | Freq: Every day | ORAL | 3 refills | Status: AC

## 2023-10-29 ENCOUNTER — Telehealth: Payer: Self-pay | Admitting: *Deleted

## 2023-10-29 NOTE — Progress Notes (Signed)
 Care Guide Pharmacy Note  10/29/2023 Name: Rick Edwards MRN: [id number] DOB: [birth year]  Referred By: Etta Grandchild, MD Reason for referral: Care Coordination (Outreach to schedule referral with pharmacist )   Rick Edwards is a 68 y.o. year old male who is a primary care patient of Etta Grandchild, MD.  Rick Edwards was referred to the pharmacist for assistance related to: HTN  Successful contact was made with the patient to discuss pharmacy services including being ready for the pharmacist to call at least 5 minutes before the scheduled appointment time and to have medication bottles and any blood pressure readings ready for review. The patient agreed to meet with the pharmacist via telephone visit on 11/19/2023  Burman Nieves, CMA, Care Guide [facility], [facility] Guide Direct Dial: [phone]  Fax: [phone] Website: [email or web address]

## 2023-10-30 ENCOUNTER — Ambulatory Visit (INDEPENDENT_AMBULATORY_CARE_PROVIDER_SITE_OTHER)

## 2023-10-30 DIAGNOSIS — Z7901 Long term (current) use of anticoagulants: Secondary | ICD-10-CM

## 2023-10-30 LAB — POCT INR: INR: 3.1 — AB (ref 2.0–3.0)

## 2023-10-30 NOTE — Patient Instructions (Addendum)
 Pre visit review using our clinic review tool, if applicable. No additional management support is needed unless otherwise documented below in the visit note.  Reduce dose today to take 1/2 tablet and then change weekly dose to take 1 1/2 tablets daily except take 1 tablet on Tuesday and Friday.  Recheck in 3 weeks.

## 2023-10-30 NOTE — Progress Notes (Signed)
 Pt is scheduled for upper EUS on 5/7. Pt will be placed on a lovenox bridge and will be given instructions at next coumadin clinic apt.  Reduce dose today to take 1/2 tablet and then change weekly dose to take 1 1/2 tablets daily except take 1 tablet on Tuesday and Friday.  Recheck in 3 weeks.  Updated lovenox bridge schedule with new weekly dosing.

## 2023-11-03 ENCOUNTER — Other Ambulatory Visit: Payer: Self-pay | Admitting: Internal Medicine

## 2023-11-03 DIAGNOSIS — E781 Pure hyperglyceridemia: Secondary | ICD-10-CM

## 2023-11-19 ENCOUNTER — Other Ambulatory Visit: Admitting: Pharmacist

## 2023-11-19 DIAGNOSIS — I10 Essential (primary) hypertension: Secondary | ICD-10-CM

## 2023-11-19 NOTE — Progress Notes (Signed)
 11/19/2023 Name: Rick Edwards MRN: [id number] DOB: 1956-08-04  Chief Complaint  Patient presents with   Hypertension   Medication Management     Rick Edwards is a 68 y.o. year old male who presented for a telephone visit.   They were referred to the pharmacist by their PCP for assistance in managing diabetes and hypertension.    Subjective:  Care Team: Primary Care Provider: Etta Grandchild, MD ; Next Scheduled Visit: 07/26/2023  Medication Access/Adherence  Current Pharmacy:  [facility] [phone] [place], [place] - [address] [place] [address] Phone: [phone] Fax: [phone]  [facility] - [place], [place] - [address] [place] [address] Phone: [phone] Fax: [phone]   Patient reports affordability concerns with their medications: No  Patient reports access/transportation concerns to their pharmacy: No  Patient reports adherence concerns with their medications:  No     Hypertension:  Current medications: amlodipine 10 mg daily, carvedilol 25 mg twice daily, torsemide 20 mg daily, spironolactone 25 mg daily Previous medications: stopped olmesartan 12/13 due to hypotension  Patient does not have a validated, automated, upper arm home BP cuff. He goes to the pharmacy to check his BP. Current blood pressure reading: 134/83   Patient denies hypotensive s/sx including dizziness, lightheadedness.     Objective:  BP Readings from Last 3 Encounters:  10/24/23 134/78  08/29/23 130/80  08/15/23 138/85     Lab Results  Component Value Date   HGBA1C 5.9 06/12/2023    Lab Results  Component Value Date   CREATININE 1.01 10/24/2023   BUN 13 10/24/2023   NA 137 10/24/2023   K 3.4 (L) 10/24/2023   CL 97 10/24/2023   CO2 33 (H) 10/24/2023    Lab Results  Component Value Date   CHOL 148 12/26/2022   HDL 34.50 (L) 12/26/2022   LDLCALC 91 12/26/2022   LDLDIRECT 97.0  05/05/2019   TRIG 114.0 12/26/2022   CHOLHDL 4 12/26/2022    Medications Reviewed Today     Reviewed by Rick Edwards, RPH (Pharmacist) on 11/19/23 at 1616  Med List Status: <None>   Medication Order Taking? Sig Documenting Provider Last Dose Status Informant  allopurinol (ZYLOPRIM) 300 MG tablet [id number]  TAKE 1 TABLET BY MOUTH EVERY DAY Etta Grandchild, MD  Active   amLODipine (NORVASC) 10 MG tablet [id number]  Take 1 tablet (10 mg total) by mouth daily. Etta Grandchild, MD  Active   atorvastatin (LIPITOR) 40 MG tablet [id number]  TAKE 1 TABLET BY MOUTH EVERY DAY Etta Grandchild, MD  Active   carvedilol (COREG) 25 MG tablet [id number]  TAKE 1 TABLET (25 MG TOTAL) BY MOUTH TWICE A DAY WITH MEALS Etta Grandchild, MD  Active   ciclopirox (LOPROX) 0.77 % cream [id number]  Apply topically 2 (two) times daily. Etta Grandchild, MD  Active   enoxaparin (LOVENOX) 100 MG/ML injection [id number]  Inject 1 mL (100 mg total) into the skin daily. INJECT AS DIRECTED BY ANTICOAGULATION CLINIC Etta Grandchild, MD  Active   hyoscyamine (LEVSIN SL) 0.125 MG SL tablet [id number]  Place under the tongue 2 (two) times daily as needed. [provider]  Active            Med Note Jonnie Kind Mar 17, 2021  2:29 PM) 03/17/21: Reports has not needed recently  spironolactone (ALDACTONE) 25 MG tablet [id number] Yes Take 1 tablet (25 mg total)  by mouth daily. Arcadio Knuckles, MD Taking Active   torsemide (DEMADEX) 20 MG tablet [id number]  TAKE 1 TABLET BY MOUTH EVERY DAY Arcadio Knuckles, MD  Active   triamcinolone cream (KENALOG) 0.5 % [id number]  Apply 1 Application topically 3 (three) times daily. Arcadio Knuckles, MD  Active   VASCEPA 1 g capsule [id number] Yes TAKE 2 CAPSULES BY MOUTH 2 TIMES DAILY. Arcadio Knuckles, MD Taking Active   warfarin (COUMADIN) 5 MG tablet [id number]  TAKE 1 1/2 TABLETS BY MOUTH DAILY EXCEPT TAKE 1 TABLET ON TUESDAY, THURSDAY AND SATURDAY OR AS DIRECTED BY CLINIC. Arcadio Knuckles, MD  Active   Med List Note Clenton Czech, CPhT 05/10/12 1734): Patient family member states that doctor wanted him to stop the coumadin before procedure.               Assessment/Plan:   Hypertension: - BP better controlled, BP goal <130/80. - Continue current regimen. Call if any concerns    Follow Up Plan: PRN  Rainelle Bur, PharmD, BCPS Clinical Pharmacist [facility] [phone]

## 2023-11-19 NOTE — Patient Instructions (Signed)
 It was a pleasure speaking with you today!  Continue your current regimen.  Feel free to call with any questions or concerns!  Arbutus Leas, PharmD, BCPS, CPP Clinical Pharmacist Practitioner [facility] [phone]

## 2023-11-20 ENCOUNTER — Ambulatory Visit (INDEPENDENT_AMBULATORY_CARE_PROVIDER_SITE_OTHER)

## 2023-11-20 DIAGNOSIS — Z7901 Long term (current) use of anticoagulants: Secondary | ICD-10-CM | POA: Diagnosis not present

## 2023-11-20 LAB — POCT INR: INR: 2.7 (ref 2.0–3.0)

## 2023-11-20 MED ORDER — ENOXAPARIN SODIUM 150 MG/ML IJ SOSY: 150.0000 mg | PREFILLED_SYRINGE | INTRAMUSCULAR | 0 refills | Status: DC

## 2023-11-20 NOTE — Patient Instructions (Addendum)
 Pre visit review using our clinic review tool, if applicable. No additional management support is needed unless otherwise documented below in the visit note.  Continue 1 1/2 tablets daily except take 1 tablet on Tuesday and Friday until starting instructions below.  5/2: Take last dose of warfarin 5/3: NO warfarin, NO Lovenox 5/4: NO warfarin, inject Lovenox once in the AM 5/5: NO warfarin, inject Lovenox once in the AM 5/6: NO warfarin, inject Lovenox once in the AM (BEFORE 7 AM)   5/7:SURGERY; NO WARFARIN, NO LOVENOX   5/8: Take 2 tablets (10 mg) warfarin, inject Lovenox once in the AM 5/9: Take 1 1/2 tablets (7.5 mg) warfarin, inject Lovenox once in the AM 5/10: Take 2 tablets (10 mg) warfarin, inject Lovenox once in the AM 5/11: Take 2 1/2 tablets (12.5 mg) warfarin, inject Lovenox once in the AM 5/12: Take 1 1/2 tablets (7.5 mg) warfarin, inject Lovenox once in the AM 5/13: Recheck INR; NO WARFARIN AND NO LOVENOX UNTIL AFTER INR CHECK

## 2023-11-20 NOTE — Progress Notes (Signed)
 Pt is scheduled for upper EUS on 5/7. Pt will be on a lovenox bridge. Continue 1 1/2 tablets daily except take 1 tablet on Tuesday and Friday until starting instructions below.  5/2: Take last dose of warfarin 5/3: NO warfarin, NO Lovenox 5/4: NO warfarin, inject Lovenox once in the AM 5/5: NO warfarin, inject Lovenox once in the AM 5/6: NO warfarin, inject Lovenox once in the AM (BEFORE 7 AM)   5/7:SURGERY; NO WARFARIN, NO LOVENOX   5/8: Take 2 tablets (10 mg) warfarin, inject Lovenox once in the AM 5/9: Take 1 1/2 tablets (7.5 mg) warfarin, inject Lovenox once in the AM 5/10: Take 2 tablets (10 mg) warfarin, inject Lovenox once in the AM 5/11: Take 2 1/2 tablets (12.5 mg) warfarin, inject Lovenox once in the AM 5/12: Take 1 1/2 tablets (7.5 mg) warfarin, inject Lovenox once in the AM 5/13: Recheck INR; NO WARFARIN AND NO LOVENOX UNTIL AFTER INR CHECK  Sent in Lovenox prescription.

## 2023-11-28 ENCOUNTER — Other Ambulatory Visit: Payer: Self-pay | Admitting: Internal Medicine

## 2023-11-28 DIAGNOSIS — I10 Essential (primary) hypertension: Secondary | ICD-10-CM

## 2023-12-05 ENCOUNTER — Encounter (HOSPITAL_COMMUNITY): Payer: Self-pay | Admitting: Gastroenterology

## 2023-12-05 NOTE — Progress Notes (Signed)
 Attempted to obtain medical history for pre op call via telephone, unable to reach at this time. HIPAA compliant voicemail message left requesting return call to pre surgical testing department.

## 2023-12-11 NOTE — Anesthesia Preprocedure Evaluation (Signed)
 Anesthesia Evaluation  Patient identified by MRN, date of birth, ID band Patient awake    Reviewed: Allergy & Precautions, NPO status , Patient's Chart, lab work & pertinent test results  History of Anesthesia Complications Negative for: history of anesthetic complications  Airway Mallampati: II  TM Distance: >3 FB Neck ROM: Full    Dental no notable dental hx. (+) Poor Dentition, Missing   Pulmonary former smoker   Pulmonary exam normal breath sounds clear to auscultation       Cardiovascular hypertension, Pt. on medications and Pt. on home beta blockers (-) angina (-) Past MI Normal cardiovascular exam Rhythm:Regular Rate:Normal     Neuro/Psych    GI/Hepatic   Endo/Other    Renal/GU Renal InsufficiencyRenal diseaseLab Results      Component                Value               Date                      NA                       137                 10/24/2023                     K                        3.4 (L)             10/24/2023                CO2                      33 (H)              10/24/2023                BUN                      13                  10/24/2023                CREATININE               1.01                10/24/2023                                Musculoskeletal   Abdominal   Peds  Hematology On lovenox  Lab Results      Component                Value               Date                              HGB                      16.0                10/24/2023  HCT                      47.5                10/24/2023                MCV                      92.0                10/24/2023                PLT                      349.0               10/24/2023              Anesthesia Other Findings All: Mesalamine  Reproductive/Obstetrics                             Anesthesia Physical Anesthesia Plan  ASA: 3  Anesthesia Plan: MAC   Post-op Pain  Management: Minimal or no pain anticipated   Induction: Intravenous  PONV Risk Score and Plan: Treatment may vary due to age or medical condition and Propofol  infusion  Airway Management Planned: Natural Airway and Nasal Cannula  Additional Equipment: None  Intra-op Plan:   Post-operative Plan:   Informed Consent: I have reviewed the patients History and Physical, chart, labs and discussed the procedure including the risks, benefits and alternatives for the proposed anesthesia with the patient or authorized representative who has indicated his/her understanding and acceptance.     Dental advisory given  Plan Discussed with: CRNA and Surgeon  Anesthesia Plan Comments: (EGD for Garstic nodules)       Anesthesia Quick Evaluation

## 2023-12-12 ENCOUNTER — Ambulatory Visit (HOSPITAL_COMMUNITY): Payer: Self-pay | Admitting: Anesthesiology

## 2023-12-12 ENCOUNTER — Ambulatory Visit (HOSPITAL_COMMUNITY)
Admission: RE | Admit: 2023-12-12 | Discharge: 2023-12-12 | Disposition: A | Discharge status: Home / Self Care | Payer: Medicare Other | Attending: Gastroenterology | Admitting: Gastroenterology

## 2023-12-12 ENCOUNTER — Ambulatory Visit (HOSPITAL_BASED_OUTPATIENT_CLINIC_OR_DEPARTMENT_OTHER): Payer: Self-pay | Admitting: Anesthesiology

## 2023-12-12 ENCOUNTER — Encounter (HOSPITAL_COMMUNITY): Payer: Self-pay | Admitting: Gastroenterology

## 2023-12-12 ENCOUNTER — Encounter (HOSPITAL_COMMUNITY)
Admission: RE | Disposition: A | Discharge status: Home / Self Care | Payer: Self-pay | Source: Home / Self Care | Attending: Gastroenterology

## 2023-12-12 DIAGNOSIS — R933 Abnormal findings on diagnostic imaging of other parts of digestive tract: Secondary | ICD-10-CM

## 2023-12-12 DIAGNOSIS — Z79899 Other long term (current) drug therapy: Secondary | ICD-10-CM | POA: Insufficient documentation

## 2023-12-12 DIAGNOSIS — K222 Esophageal obstruction: Secondary | ICD-10-CM

## 2023-12-12 DIAGNOSIS — Z87891 Personal history of nicotine dependence: Secondary | ICD-10-CM | POA: Insufficient documentation

## 2023-12-12 DIAGNOSIS — K3189 Other diseases of stomach and duodenum: Secondary | ICD-10-CM | POA: Diagnosis not present

## 2023-12-12 DIAGNOSIS — I129 Hypertensive chronic kidney disease with stage 1 through stage 4 chronic kidney disease, or unspecified chronic kidney disease: Secondary | ICD-10-CM | POA: Diagnosis not present

## 2023-12-12 DIAGNOSIS — N189 Chronic kidney disease, unspecified: Secondary | ICD-10-CM | POA: Diagnosis not present

## 2023-12-12 DIAGNOSIS — K5090 Crohn's disease, unspecified, without complications: Secondary | ICD-10-CM | POA: Insufficient documentation

## 2023-12-12 DIAGNOSIS — I739 Peripheral vascular disease, unspecified: Secondary | ICD-10-CM | POA: Diagnosis not present

## 2023-12-12 DIAGNOSIS — K319 Disease of stomach and duodenum, unspecified: Secondary | ICD-10-CM | POA: Diagnosis not present

## 2023-12-12 DIAGNOSIS — I10 Essential (primary) hypertension: Secondary | ICD-10-CM | POA: Insufficient documentation

## 2023-12-12 DIAGNOSIS — N1831 Chronic kidney disease, stage 3a: Secondary | ICD-10-CM | POA: Diagnosis not present

## 2023-12-12 DIAGNOSIS — K449 Diaphragmatic hernia without obstruction or gangrene: Secondary | ICD-10-CM

## 2023-12-12 HISTORY — PX: ESOPHAGOGASTRODUODENOSCOPY: SHX5428

## 2023-12-12 HISTORY — PX: UPPER ESOPHAGEAL ENDOSCOPIC ULTRASOUND (EUS): SHX6562

## 2023-12-12 LAB — PROTIME-INR
INR: 1.1 (ref 0.8–1.2)
Prothrombin Time: 14.3 s (ref 11.4–15.2)

## 2023-12-12 SURGERY — UPPER ESOPHAGEAL ENDOSCOPIC ULTRASOUND (EUS)
Anesthesia: Monitor Anesthesia Care

## 2023-12-12 MED ORDER — LIDOCAINE 2% (20 MG/ML) 5 ML SYRINGE
INTRAMUSCULAR | Status: DC | PRN
  Administered 2023-12-12: 80 mg via INTRAVENOUS

## 2023-12-12 MED ORDER — GLYCOPYRROLATE PF 0.2 MG/ML IJ SOSY
PREFILLED_SYRINGE | INTRAMUSCULAR | Status: DC | PRN
  Administered 2023-12-12: .2 mg via INTRAVENOUS

## 2023-12-12 MED ORDER — PROPOFOL 10 MG/ML IV BOLUS
INTRAVENOUS | Status: DC | PRN
  Administered 2023-12-12 (×2): 20 mg via INTRAVENOUS

## 2023-12-12 MED ORDER — SODIUM CHLORIDE 0.9 % IV SOLN: INTRAVENOUS | Status: DC | PRN

## 2023-12-12 MED ORDER — PROPOFOL 1000 MG/100ML IV EMUL
INTRAVENOUS | Status: AC
  Filled 2023-12-12: qty 100

## 2023-12-12 MED ORDER — PROPOFOL 500 MG/50ML IV EMUL
INTRAVENOUS | Status: DC | PRN
  Administered 2023-12-12: 125 ug/kg/min via INTRAVENOUS

## 2023-12-12 MED ORDER — SODIUM CHLORIDE 0.9 % IV SOLN: INTRAVENOUS | Status: DC

## 2023-12-12 NOTE — Transfer of Care (Signed)
 Immediate Anesthesia Transfer of Care Note  Patient: Rick Edwards  Procedure(s) Performed: UPPER ESOPHAGEAL ENDOSCOPIC ULTRASOUND (EUS) EGD (ESOPHAGOGASTRODUODENOSCOPY)  Patient Location: PACU  Anesthesia Type:MAC  Level of Consciousness: awake, alert , and oriented  Airway & Oxygen Therapy: Patient Spontanous Breathing and Patient connected to face mask oxygen  Post-op Assessment: Report given to RN and Post -op Vital signs reviewed and stable  Post vital signs: Reviewed and stable  Last Vitals:  Vitals Value Taken Time  BP    Temp    Pulse 80 12/12/23 1048  Resp 22 12/12/23 1048  SpO2 98 % 12/12/23 1048  Vitals shown include unfiled device data.  Last Pain:  Vitals:   12/12/23 0826  TempSrc: Temporal  PainSc: 0-No pain         Complications: No notable events documented.

## 2023-12-12 NOTE — H&P (Signed)
 [facility] H/P Note  Chief Complaint: gastric nodules   HPI: Rick Edwards is an 68 y.o. male.  Gastric nodules seen on recent EGD.  Here for further evaluation with EUS.  Past Medical History:  Diagnosis Date   Anemia    anemia thrombocytopenia, saw Hematology 2011, can not r/o myeloproliferative d/c   Antithrombin III  deficiency (HCC) 05/16/2013   On coumadin  for this   Blood clot in vein    in right leg   Chronic kidney disease 05/27/2013   ACUTE RENAL FAILURE    Crohn's disease (HCC)    tx. Imuran    GI bleed 6/11   w/ normal EGD 6/11, 3 PRBCs    Hearing loss    wears hearing aid left ear; birthed with hearing loss   HOH (hard of hearing) 05/16/2013   Hypercalcemia    Hyperkalemia 05/27/2013   Hypertension    Ileostomy in place [facility]) 04-11-13   04-18-13 ileostomy to be taken down.   Perianal abscess 2001   s/p right hemicolectomy, and drainage of retroperitoneal abcess 2003   Peripheral vascular disease (HCC)    Transfusion history    last 8'13    Past Surgical History:  Procedure Laterality Date   Anal fistulotomy  2002   EMBOLECTOMY  03/12/2012   Procedure: EMBOLECTOMY;  Surgeon: Dannis Dy, MD;  Location: [facility] OR;  Service: Vascular;  Laterality: Right;  Right popliteal embolectomy with vein patch angioplasty, right posterior tibial embolectomy with vein patch angioplasty    HEMICOLECTOMY  08/29/2001   perforated abscess   ILEOSTOMY CLOSURE N/A 04/18/2013   Procedure: ILEOSTOMY TAKEDOWN;  Surgeon: Quitman Bucy, MD;  Location: [facility] ORS;  Service: General;  Laterality: N/A;   INTRAOPERATIVE ARTERIOGRAM  03/12/2012   Procedure: INTRA OPERATIVE ARTERIOGRAM;  Surgeon: Dannis Dy, MD;  Location: [facility] OR;  Service: Vascular;  Laterality: Right;   PARTIAL COLECTOMY  03/11/2012   Procedure: PARTIAL COLECTOMY;  Surgeon: Quitman Bucy, MD;  Location: [facility] ORS;  Service: General;  Laterality: N/A;  subtotal colectomy transverse and left colon     TRANSMETATARSAL AMPUTATION  05/10/2012   right    Medications Prior to Admission  Medication Sig Dispense Refill   allopurinol  (ZYLOPRIM ) 300 MG tablet TAKE 1 TABLET BY MOUTH EVERY DAY 90 tablet 1   amLODipine  (NORVASC ) 10 MG tablet Take 1 tablet (10 mg total) by mouth daily.     atorvastatin  (LIPITOR) 40 MG tablet TAKE 1 TABLET BY MOUTH EVERY DAY 90 tablet 1   carvedilol  (COREG ) 25 MG tablet TAKE 1 TABLET (25 MG TOTAL) BY MOUTH TWICE A DAY WITH MEALS 180 tablet 1   ciclopirox  (LOPROX ) 0.77 % cream Apply topically 2 (two) times daily. 90 g 2   spironolactone  (ALDACTONE ) 25 MG tablet Take 1 tablet (25 mg total) by mouth daily. 90 tablet 3   torsemide  (DEMADEX ) 20 MG tablet TAKE 1 TABLET BY MOUTH EVERY DAY 90 tablet 0   triamcinolone  cream (KENALOG ) 0.5 % Apply 1 Application topically 3 (three) times daily. 30 g 2   VASCEPA  1 g capsule TAKE 2 CAPSULES BY MOUTH 2 TIMES DAILY. 360 capsule 1   warfarin (COUMADIN ) 5 MG tablet TAKE 1 1/2 TABLETS BY MOUTH DAILY EXCEPT TAKE 1 TABLET ON TUESDAY, THURSDAY AND SATURDAY OR AS DIRECTED BY CLINIC. 120 tablet 1   enoxaparin  (LOVENOX ) 150 MG/ML injection Inject 1 mL (150 mg total) into the skin daily. USE AS DIRECTED BY ANTICOAGULATION CLINIC 8 mL 0  hyoscyamine (LEVSIN SL) 0.125 MG SL tablet Place under the tongue 2 (two) times daily as needed.      Allergies:  Allergies  Allergen Reactions   Mesalamine Rash    REACTION: Rash    Family History  Problem Relation Age of Onset   Hypertension Mother    Hyperlipidemia Mother    Hypertension Father    Hyperlipidemia Father    Heart disease Father    Stroke Other        GF, aunts    Hypertension Other        "the whole family"   Coronary artery disease Neg Hx    Diabetes Neg Hx    Colon cancer Neg Hx    Prostate cancer Neg Hx    Kidney failure Neg Hx     Social History:  reports that he quit smoking about 12 years ago. His smoking use included cigarettes. He started smoking about 21 years  ago. He has a 2.3 pack-year smoking history. He has never used smokeless tobacco. He reports current drug use. Drug: Marijuana. He reports that he does not drink alcohol.   ROS: As per HPI, all others negative   Blood pressure (!) 151/97, pulse 65, temperature 98.7 F (37.1 C), temperature source Temporal, resp. rate 17, height 5\' 9"  (1.753 m), weight 90.7 kg, SpO2 99%. General appearance: NAD CV:  Regular RESP:  No visible distress ABD:  Soft, non-tender NEURO:  Presbyacusis; no encephalopathy   Results for orders placed or performed during the hospital encounter of 12/12/23 (from the past 48 hours)  Protime-INR     Status: None   Collection Time: 12/12/23  8:56 AM  Result Value Ref Range   Prothrombin Time 14.3 11.4 - 15.2 seconds   INR 1.1 0.8 - 1.2    Comment: (NOTE) INR goal varies based on device and disease states. Performed at [facility], [address], [place], [place] [address]    No results found.  Assessment/Plan   Gastric nodules. Endoscopy with ultrasound. Risks (bleeding, infection, bowel perforation that could require surgery, sedation-related changes in cardiopulmonary systems), benefits (identification and possible treatment of source of symptoms, exclusion of certain causes of symptoms), and alternatives (watchful waiting, radiographic imaging studies, empiric medical treatment) of upper endoscopy  with ultrasound (EGD + EUS) were explained to patient/family in detail and patient wishes to proceed.   Yves Herb 12/12/2023, 10:07 AM

## 2023-12-12 NOTE — Discharge Instructions (Signed)
Endoscopy °Care After °Please read the instructions outlined below and refer to this sheet in the next few weeks. These discharge instructions provide you with general information on caring for yourself after you leave the hospital. Your doctor may also give you specific instructions. While your treatment has been planned according to the most current medical practices available, unavoidable complications occasionally occur. If you have any problems or questions after discharge, please call Dr. Anderson Coppock ([facility]) at [phone]. ° °HOME CARE INSTRUCTIONS °Activity °· You may resume your regular activity but move at a slower pace for the next 24 hours.  °· Take frequent rest periods for the next 24 hours.  °· Walking will help expel (get rid of) the air and reduce the bloated feeling in your abdomen.  °· No driving for 24 hours (because of the anesthesia (medicine) used during the test).  °· You may shower.  °· Do not sign any important legal documents or operate any machinery for 24 hours (because of the anesthesia used during the test).  °Nutrition °· Drink plenty of fluids.  °· You may resume your normal diet.  °· Begin with a light meal and progress to your normal diet.  °· Avoid alcoholic beverages for 24 hours or as instructed by your caregiver.  °Medications °You may resume your normal medications unless your caregiver tells you otherwise. °What you can expect today °· You may experience abdominal discomfort such as a feeling of fullness or "gas" pains.  °· You may experience a sore throat for 2 to 3 days. This is normal. Gargling with salt water may help this.  °·  °SEEK IMMEDIATE MEDICAL CARE IF: °· You have excessive nausea (feeling sick to your stomach) and/or vomiting.  °· You have severe abdominal pain and distention (swelling).  °· You have trouble swallowing.  °· You have a temperature over 100° F (37.8° C).  °· You have rectal bleeding or vomiting of blood.  °Document Released:  03/07/2004 Document Revised: 04/05/2011 Document Reviewed: 09/18/2007 °ExitCare® Patient Information ©2012 [employer]. °

## 2023-12-12 NOTE — Anesthesia Postprocedure Evaluation (Signed)
 Anesthesia Post Note  Patient: Rick Edwards  Procedure(s) Performed: UPPER ESOPHAGEAL ENDOSCOPIC ULTRASOUND (EUS) EGD (ESOPHAGOGASTRODUODENOSCOPY)     Patient location during evaluation: Endoscopy Anesthesia Type: MAC Level of consciousness: awake and alert Pain management: pain level controlled Vital Signs Assessment: post-procedure vital signs reviewed and stable Respiratory status: spontaneous breathing, nonlabored ventilation, respiratory function stable and patient connected to nasal cannula oxygen Cardiovascular status: blood pressure returned to baseline and stable Postop Assessment: no apparent nausea or vomiting Anesthetic complications: no  No notable events documented.  Last Vitals:  Vitals:   12/12/23 1100 12/12/23 1110  BP: 135/75 (!) 129/95  Pulse: 81 73  Resp: 17 18  Temp:    SpO2: 94% 96%    Last Pain:  Vitals:   12/12/23 1110  TempSrc:   PainSc: 0-No pain                 Rosalita Combe

## 2023-12-12 NOTE — Op Note (Signed)
 [facility] Patient Name: Rick Edwards Procedure Date: 12/12/2023 MRN: [id number] Attending MD: Evangeline Hilts , MD, [id number] Date of Birth: 1955/12/05 CSN: [id number] Age: 68 Admit Type: Outpatient Procedure:                Upper EUS Indications:              Gastric mucosal mass/polyp found on endoscopy Providers:                Evangeline Hilts, MD, Nadean August, RN, Tyrus Gallus, Technician Referring MD:             Dr. Felecia Hopper Medicines:                Monitored Anesthesia Care Complications:            No immediate complications. Estimated Blood Loss:     Estimated blood loss: none. Procedure:                Pre-Anesthesia Assessment:                           - Prior to the procedure, a History and Physical                            was performed, and patient medications and                            allergies were reviewed. The patient's tolerance of                            previous anesthesia was also reviewed. The risks                            and benefits of the procedure and the sedation                            options and risks were discussed with the patient.                            All questions were answered, and informed consent                            was obtained. Prior Anticoagulants: The patient has                            taken Coumadin  (warfarin), last dose was 4 days                            prior to procedure, as well as Lovenox , last dose                            one day before procedure. ASA Grade Assessment: III                            -  A patient with severe systemic disease. After                            reviewing the risks and benefits, the patient was                            deemed in satisfactory condition to undergo the                            procedure.                           After obtaining informed consent, the endoscope was                             passed under direct vision. Throughout the                            procedure, the patient's blood pressure, pulse, and                            oxygen saturations were monitored continuously. The                            [id number] ([id number]) Olympus radial ultrasound                            scope was introduced through the mouth, and                            advanced to the second part of duodenum. egd scope                            [id number] Scope In: Scope Out: Findings:      ENDOSCOPIC FINDING: :      A small hiatal hernia was present.      One benign-appearing, intrinsic mild stenosis was found. The stenosis       was traversed. Highly consistent widely patent Schatzki's ring.      A few medium submucosal papules (nodules) with no stigmata of recent       bleeding were found in the prepyloric region of the stomach.      The examined duodenum was normal.      ENDOSONOGRAPHIC FINDING: :      A round intramural (subepithelial) lesion was found in the prepyloric       region of the stomach. The lesion was heterogenous. Sonographically, the       lesion appeared to originate from the deep mucosa (Layer 2). The lesion       also appeared to involve the following wall layer(s): submucosa (Layer       3). The lesion also measured 30 mm by 22 mm in diameter. The outer       endosonographic borders were well defined. There was sonographic       evidence suggesting invasion into the muscularis propria (Layer 4) and       the serosa (Layer 5). Impression:               -  Small hiatal hernia.                           - Benign-appearing esophageal stenosis.                           - A few submucosal papules (nodules) found in the                            stomach.                           - Normal examined duodenum.                           - An intramural (subepithelial) lesion was found in                            the prepyloric region of the stomach. The lesion                             appeared to originate from within the deep mucosa                            (Layer 2). Tissue has not been obtained. EUS                            appearance is highly consistent with a lipoma.                           - No specimens collected. Moderate Sedation:      None Recommendation:           - Discharge patient to home (via wheelchair).                           - Resume previous diet today.                           - Continue present medications.                           - Return to GI clinic PRN. No further                            surveillance/testing of gastric lipoma needed, in                            absence of new/interval symptoms.                           - Return to referring physician as previously                            scheduled. Procedure Code(s):        --- Professional ---  41324, Esophagogastroduodenoscopy, flexible,                            transoral; with endoscopic ultrasound examination                            limited to the esophagus, stomach or duodenum, and                            adjacent structures Diagnosis Code(s):        --- Professional ---                           K44.9, Diaphragmatic hernia without obstruction or                            gangrene                           K22.2, Esophageal obstruction                           K31.89, Other diseases of stomach and duodenum CPT copyright 2022 American [employer] Association. All rights reserved. The codes documented in this report are preliminary and upon coder review may  be revised to meet current compliance requirements. Evangeline Hilts, MD 12/12/2023 11:11:50 AM This report has been signed electronically. Number of Addenda: 0

## 2023-12-18 ENCOUNTER — Ambulatory Visit (INDEPENDENT_AMBULATORY_CARE_PROVIDER_SITE_OTHER)

## 2023-12-18 DIAGNOSIS — Z7901 Long term (current) use of anticoagulants: Secondary | ICD-10-CM | POA: Diagnosis not present

## 2023-12-18 LAB — POCT INR: INR: 2 (ref 2.0–3.0)

## 2023-12-18 NOTE — Progress Notes (Signed)
 Pt had upper EUS on 5/7 and was placed on a lovenox  bridge. Stop lovenox  injections. Continue 1 1/2 tablets daily except take 1 tablet on Tuesday and Friday. Recheck in 4 weeks.

## 2023-12-18 NOTE — Patient Instructions (Addendum)
 Pre visit review using our clinic review tool, if applicable. No additional management support is needed unless otherwise documented below in the visit note.  Continue 1 1/2 tablets daily except take 1 tablet on Tuesday and Friday. Recheck in 4 weeks.

## 2023-12-21 ENCOUNTER — Other Ambulatory Visit: Payer: Self-pay | Admitting: Internal Medicine

## 2023-12-21 DIAGNOSIS — I10 Essential (primary) hypertension: Secondary | ICD-10-CM

## 2023-12-31 ENCOUNTER — Other Ambulatory Visit: Payer: Self-pay | Admitting: Internal Medicine

## 2023-12-31 DIAGNOSIS — E785 Hyperlipidemia, unspecified: Secondary | ICD-10-CM

## 2024-01-15 ENCOUNTER — Ambulatory Visit (INDEPENDENT_AMBULATORY_CARE_PROVIDER_SITE_OTHER)

## 2024-01-15 DIAGNOSIS — Z7901 Long term (current) use of anticoagulants: Secondary | ICD-10-CM

## 2024-01-15 LAB — POCT INR: INR: 2.1 (ref 2.0–3.0)

## 2024-01-15 NOTE — Patient Instructions (Addendum)
 Pre visit review using our clinic review tool, if applicable. No additional management support is needed unless otherwise documented below in the visit note.  Continue 1 1/2 tablets daily except take 1 tablet on Tuesday and Friday. Recheck in 4 weeks.

## 2024-01-15 NOTE — Progress Notes (Signed)
 Continue 1 1/2 tablets daily except take 1 tablet on Tuesday and Friday. Recheck in 4 weeks.

## 2024-01-16 ENCOUNTER — Encounter: Payer: Self-pay | Admitting: Urology

## 2024-01-16 ENCOUNTER — Ambulatory Visit (INDEPENDENT_AMBULATORY_CARE_PROVIDER_SITE_OTHER): Payer: Medicare Other | Admitting: Urology

## 2024-01-16 VITALS — BP 151/89 | HR 79 | Ht 69.0 in | Wt 207.0 lb

## 2024-01-16 DIAGNOSIS — N400 Enlarged prostate without lower urinary tract symptoms: Secondary | ICD-10-CM | POA: Diagnosis not present

## 2024-01-16 DIAGNOSIS — R972 Elevated prostate specific antigen [PSA]: Secondary | ICD-10-CM | POA: Diagnosis not present

## 2024-01-16 DIAGNOSIS — N2889 Other specified disorders of kidney and ureter: Secondary | ICD-10-CM

## 2024-01-16 DIAGNOSIS — R8290 Unspecified abnormal findings in urine: Secondary | ICD-10-CM | POA: Diagnosis not present

## 2024-01-16 DIAGNOSIS — N281 Cyst of kidney, acquired: Secondary | ICD-10-CM | POA: Diagnosis not present

## 2024-01-16 LAB — URINALYSIS, ROUTINE W REFLEX MICROSCOPIC
Bilirubin, UA: NEGATIVE
Glucose, UA: NEGATIVE
Ketones, UA: NEGATIVE
Leukocytes,UA: NEGATIVE
Nitrite, UA: POSITIVE — AB
Specific Gravity, UA: 1.015 (ref 1.005–1.030)
Urobilinogen, Ur: 1 mg/dL (ref 0.2–1.0)
pH, UA: 5.5 (ref 5.0–7.5)

## 2024-01-16 LAB — MICROSCOPIC EXAMINATION

## 2024-01-16 NOTE — Progress Notes (Signed)
 Assessment: 1. Elevated PSA - negative biopsy 11/24   2. BPH without obstruction/lower urinary tract symptoms   3. Right renal mass - Bosniak 3 cyst   4. Abnormal urine findings     Plan: PSA today Urine culture sent today. Will contact with results. Return to office in 6 months Will arrange for repeat imagine of right renal mass at next visit  Chief Complaint:  Chief Complaint  Patient presents with   Elevated PSA    History of Present Illness:  Rick Edwards is a 68 y.o. male who is seen for further evaluation of elevated PSA and right renal mass. PSA results: 9/20 5.54 4/21 1.5 7/22 2.08 6/23 2.99 5/24 4.79  No history of UTI's or prostatitis. No family history of prostate cancer. ExoDx from 01/10/2023 was 54.94 indicating increased risk of high-grade prostate cancer. Prostate MRI from 04/23/2023 showed a PI-RADS 3 lesion in the left peripheral zone and prostate volume of 52.63 cm. He underwent a fusion guided biopsy by Dr. Secundino Dach on 07/02/2023. Biopsy showed benign prostate tissue in all samples.  He has mild LUTS with occasional frequency, sensation of incomplete emptying, and nocturia x 2.  No dysuria or gross hematuria. IPSS = 9. He is on chronic anticoagulation with warfarin due to antithrombin III  deficiency and history of DVT.  He was found to have bilateral minimally complex renal cyst on CT and MRI from January 2025.  He was seen in consultation with Dr. Secundino Dach.  Continued surveillance recommended with repeat renal MRI in January 2026.  He returns today for follow-up.  He reports that he is doing well from a urinary standpoint.  He is not having any new lower urinary tract symptoms.  No dysuria or gross hematuria.  He does have some urgency and decreased stream. IPSS = 10/2.  Portions of the above documentation were copied from a prior visit for review purposes only.   Past Medical History:  Past Medical History:  Diagnosis Date   Anemia    anemia  thrombocytopenia, saw Hematology 2011, can not r/o myeloproliferative d/c   Antithrombin III  deficiency (HCC) 05/16/2013   On coumadin  for this   Blood clot in vein    in right leg   Chronic kidney disease 05/27/2013   ACUTE RENAL FAILURE    Crohn's disease (HCC)    tx. Imuran    GI bleed 6/11   w/ normal EGD 6/11, 3 PRBCs    Hearing loss    wears hearing aid left ear; birthed with hearing loss   HOH (hard of hearing) 05/16/2013   Hypercalcemia    Hyperkalemia 05/27/2013   Hypertension    Ileostomy in place [facility]) 04-11-13   04-18-13 ileostomy to be taken down.   Perianal abscess 2001   s/p right hemicolectomy, and drainage of retroperitoneal abcess 2003   Peripheral vascular disease (HCC)    Transfusion history    last 8'13    Past Surgical History:  Past Surgical History:  Procedure Laterality Date   Anal fistulotomy  2002   EMBOLECTOMY  03/12/2012   Procedure: EMBOLECTOMY;  Surgeon: Dannis Dy, MD;  Location: [facility] OR;  Service: Vascular;  Laterality: Right;  Right popliteal embolectomy with vein patch angioplasty, right posterior tibial embolectomy with vein patch angioplasty    ESOPHAGOGASTRODUODENOSCOPY N/A 12/12/2023   Procedure: EGD (ESOPHAGOGASTRODUODENOSCOPY);  Surgeon: Evangeline Hilts, MD;  Location: [facility];  Service: Gastroenterology;  Laterality: N/A;   HEMICOLECTOMY  08/29/2001   perforated abscess   ILEOSTOMY CLOSURE  N/A 04/18/2013   Procedure: ILEOSTOMY TAKEDOWN;  Surgeon: Quitman Bucy, MD;  Location: [facility] ORS;  Service: General;  Laterality: N/A;   INTRAOPERATIVE ARTERIOGRAM  03/12/2012   Procedure: INTRA OPERATIVE ARTERIOGRAM;  Surgeon: Dannis Dy, MD;  Location[facility] OR;  Service: Vascular;  Laterality: Right;   PARTIAL COLECTOMY  03/11/2012   Procedure: PARTIAL COLECTOMY;  Surgeon: Quitman Bucy, MD;  Location: [facility] ORS;  Service: General;  Laterality: N/A;  subtotal colectomy transverse and left colon    TRANSMETATARSAL AMPUTATION   05/10/2012   right   UPPER ESOPHAGEAL ENDOSCOPIC ULTRASOUND (EUS) N/A 12/12/2023   Procedure: UPPER ESOPHAGEAL ENDOSCOPIC ULTRASOUND (EUS);  Surgeon: Evangeline Hilts, MD;  Location: [facility];  Service: Gastroenterology;  Laterality: N/A;    Allergies:  Allergies  Allergen Reactions   Mesalamine Rash    REACTION: Rash    Family History:  Family History  Problem Relation Age of Onset   Hypertension Mother    Hyperlipidemia Mother    Hypertension Father    Hyperlipidemia Father    Heart disease Father    Stroke Other        GF, aunts    Hypertension Other        the whole family   Coronary artery disease Neg Hx    Diabetes Neg Hx    Colon cancer Neg Hx    Prostate cancer Neg Hx    Kidney failure Neg Hx     Social History:  Social History   Tobacco Use   Smoking status: Former    Current packs/day: 0.00    Average packs/day: 0.3 packs/day for 9.0 years (2.3 ttl pk-yrs)    Types: Cigarettes    Start date: 05/08/2002    Quit date: 05/09/2011    Years since quitting: 12.6   Smokeless tobacco: Never  Vaping Use   Vaping status: Never Used  Substance Use Topics   Alcohol use: No    Alcohol/week: 0.0 standard drinks of alcohol   Drug use: Yes    Types: Marijuana    ROS: Constitutional:  Negative for fever, chills, weight loss CV: Negative for chest pain, previous MI, hypertension Respiratory:  Negative for shortness of breath, wheezing, sleep apnea, frequent cough GI:  Negative for nausea, vomiting, bloody stool, GERD  Physical exam: BP (!) 151/89   Pulse 79   Ht 5' 9 (1.753 m)   Wt 207 lb (93.9 kg)   BMI 30.57 kg/m  GENERAL APPEARANCE:  Well appearing, well developed, well nourished, NAD HEENT:  Atraumatic, normocephalic, oropharynx clear NECK:  Supple without lymphadenopathy or thyromegaly ABDOMEN:  Soft, non-tender, no masses EXTREMITIES:  Moves all extremities well, without clubbing, cyanosis, or edema NEUROLOGIC:  Alert and oriented x 3, normal  gait, CN II-XII grossly intact MENTAL STATUS:  appropriate BACK:  Non-tender to palpation, No CVAT SKIN:  Warm, dry, and intact GU: Prostate: 50 g, NT, no nodules Rectum: Normal tone,  no masses or tenderness    Results: U/A: 0-5 WBCs, 0-2 RBCs, few bacteria, nitrite positive

## 2024-01-17 ENCOUNTER — Ambulatory Visit: Payer: Self-pay | Admitting: Urology

## 2024-01-17 LAB — PSA: Prostate Specific Ag, Serum: 3.9 ng/mL (ref 0.0–4.0)

## 2024-01-19 ENCOUNTER — Other Ambulatory Visit: Payer: Self-pay | Admitting: Internal Medicine

## 2024-01-19 DIAGNOSIS — Z7901 Long term (current) use of anticoagulants: Secondary | ICD-10-CM

## 2024-01-19 DIAGNOSIS — I743 Embolism and thrombosis of arteries of the lower extremities: Secondary | ICD-10-CM

## 2024-01-21 ENCOUNTER — Other Ambulatory Visit: Payer: Self-pay | Admitting: Internal Medicine

## 2024-01-21 DIAGNOSIS — I10 Essential (primary) hypertension: Secondary | ICD-10-CM

## 2024-02-06 ENCOUNTER — Other Ambulatory Visit: Payer: Self-pay | Admitting: Internal Medicine

## 2024-02-06 DIAGNOSIS — M1A39X0 Chronic gout due to renal impairment, multiple sites, without tophus (tophi): Secondary | ICD-10-CM

## 2024-02-06 DIAGNOSIS — I10 Essential (primary) hypertension: Secondary | ICD-10-CM

## 2024-02-15 ENCOUNTER — Ambulatory Visit (INDEPENDENT_AMBULATORY_CARE_PROVIDER_SITE_OTHER)

## 2024-02-15 DIAGNOSIS — Z7901 Long term (current) use of anticoagulants: Secondary | ICD-10-CM

## 2024-02-15 LAB — POCT INR: INR: 1.7 — AB (ref 2.0–3.0)

## 2024-02-15 NOTE — Patient Instructions (Addendum)
 Pre visit review using our clinic review tool, if applicable. No additional management support is needed unless otherwise documented below in the visit note.  Increase dose today to take 1 1/2 tablets and then continue 1 1/2 tablets daily except take 1 tablet on Tuesday and Friday. Recheck in 4 weeks.

## 2024-02-15 NOTE — Progress Notes (Signed)
 Increase dose today to take 1 1/2 tablets and then continue 1 1/2 tablets daily except take 1 tablet on Tuesday and Friday. Recheck in 4 weeks.

## 2024-03-05 DIAGNOSIS — H2513 Age-related nuclear cataract, bilateral: Secondary | ICD-10-CM | POA: Diagnosis not present

## 2024-03-14 ENCOUNTER — Ambulatory Visit

## 2024-03-14 DIAGNOSIS — Z7901 Long term (current) use of anticoagulants: Secondary | ICD-10-CM | POA: Diagnosis not present

## 2024-03-14 LAB — POCT INR: INR: 1.7 — AB (ref 2.0–3.0)

## 2024-03-14 NOTE — Patient Instructions (Addendum)
 Pre visit review using our clinic review tool, if applicable. No additional management support is needed unless otherwise documented below in the visit note.  Increase dose today to take 1 1/2 tablets and then change weekly dose to take 1 1/2 tablets daily except take 1 tablet on Tuesday. Recheck in 3 weeks.

## 2024-03-14 NOTE — Progress Notes (Signed)
 Pt has been eating more greens than usual. Increase dose today to take 1 1/2 tablets and then change weekly dose to take 1 1/2 tablets daily except take 1 tablet on Tuesday. Recheck in 3 weeks.

## 2024-04-04 ENCOUNTER — Ambulatory Visit

## 2024-04-04 DIAGNOSIS — Z7901 Long term (current) use of anticoagulants: Secondary | ICD-10-CM

## 2024-04-04 LAB — POCT INR: INR: 2.3 (ref 2.0–3.0)

## 2024-04-04 NOTE — Patient Instructions (Addendum)
 Pre visit review using our clinic review tool, if applicable. No additional management support is needed unless otherwise documented below in the visit note.  Continue 1 1/2 tablets daily except take 1 tablet on Tuesday. Recheck in 3 weeks.

## 2024-04-04 NOTE — Progress Notes (Signed)
 Continue 1 1/2 tablets daily except take 1 tablet on Tuesday. Recheck in 3 weeks.

## 2024-04-25 ENCOUNTER — Ambulatory Visit (INDEPENDENT_AMBULATORY_CARE_PROVIDER_SITE_OTHER)

## 2024-04-25 DIAGNOSIS — Z7901 Long term (current) use of anticoagulants: Secondary | ICD-10-CM | POA: Diagnosis not present

## 2024-04-25 LAB — POCT INR: INR: 2 (ref 2.0–3.0)

## 2024-04-25 NOTE — Progress Notes (Signed)
 Continue 1 1/2 tablets daily except take 1 tablet on Tuesday. Recheck in 3 weeks due to pt also having a PCP apt.

## 2024-04-25 NOTE — Patient Instructions (Addendum)
 Pre visit review using our clinic review tool, if applicable. No additional management support is needed unless otherwise documented below in the visit note.  Continue 1 1/2 tablets daily except take 1 tablet on Tuesday. Recheck in 3 weeks.

## 2024-04-28 ENCOUNTER — Ambulatory Visit: Admitting: Internal Medicine

## 2024-05-07 DIAGNOSIS — N179 Acute kidney failure, unspecified: Secondary | ICD-10-CM | POA: Diagnosis not present

## 2024-05-08 DIAGNOSIS — I10 Essential (primary) hypertension: Secondary | ICD-10-CM | POA: Diagnosis not present

## 2024-05-13 ENCOUNTER — Ambulatory Visit: Payer: Self-pay | Admitting: Internal Medicine

## 2024-05-13 ENCOUNTER — Ambulatory Visit (INDEPENDENT_AMBULATORY_CARE_PROVIDER_SITE_OTHER)

## 2024-05-13 ENCOUNTER — Other Ambulatory Visit: Payer: Self-pay | Admitting: Internal Medicine

## 2024-05-13 ENCOUNTER — Encounter: Payer: Self-pay | Admitting: Internal Medicine

## 2024-05-13 ENCOUNTER — Ambulatory Visit: Admitting: Internal Medicine

## 2024-05-13 VITALS — BP 158/92 | HR 90 | Temp 98.6°F | Ht 69.0 in | Wt 202.0 lb

## 2024-05-13 DIAGNOSIS — H6123 Impacted cerumen, bilateral: Secondary | ICD-10-CM

## 2024-05-13 DIAGNOSIS — N1831 Chronic kidney disease, stage 3a: Secondary | ICD-10-CM

## 2024-05-13 DIAGNOSIS — E785 Hyperlipidemia, unspecified: Secondary | ICD-10-CM | POA: Diagnosis not present

## 2024-05-13 DIAGNOSIS — I444 Left anterior fascicular block: Secondary | ICD-10-CM | POA: Diagnosis not present

## 2024-05-13 DIAGNOSIS — R972 Elevated prostate specific antigen [PSA]: Secondary | ICD-10-CM | POA: Diagnosis not present

## 2024-05-13 DIAGNOSIS — Z23 Encounter for immunization: Secondary | ICD-10-CM

## 2024-05-13 DIAGNOSIS — I10 Essential (primary) hypertension: Secondary | ICD-10-CM

## 2024-05-13 DIAGNOSIS — E781 Pure hyperglyceridemia: Secondary | ICD-10-CM

## 2024-05-13 DIAGNOSIS — Z7901 Long term (current) use of anticoagulants: Secondary | ICD-10-CM | POA: Diagnosis not present

## 2024-05-13 DIAGNOSIS — N400 Enlarged prostate without lower urinary tract symptoms: Secondary | ICD-10-CM

## 2024-05-13 DIAGNOSIS — E118 Type 2 diabetes mellitus with unspecified complications: Secondary | ICD-10-CM

## 2024-05-13 DIAGNOSIS — R001 Bradycardia, unspecified: Secondary | ICD-10-CM | POA: Insufficient documentation

## 2024-05-13 LAB — PSA: PSA: 2.94 ng/mL (ref 0.10–4.00)

## 2024-05-13 LAB — HEPATIC FUNCTION PANEL
ALT: 22 U/L (ref 0–53)
AST: 14 U/L (ref 0–37)
Albumin: 4.4 g/dL (ref 3.5–5.2)
Alkaline Phosphatase: 85 U/L (ref 39–117)
Bilirubin, Direct: 0.2 mg/dL (ref 0.0–0.3)
Total Bilirubin: 1 mg/dL (ref 0.2–1.2)
Total Protein: 6.9 g/dL (ref 6.0–8.3)

## 2024-05-13 LAB — LIPID PANEL
Cholesterol: 138 mg/dL (ref 0–200)
HDL: 43.9 mg/dL (ref >39.00)
LDL Cholesterol: 79 mg/dL (ref 0–99)
NonHDL: 94.24
Total CHOL/HDL Ratio: 3
Triglycerides: 78 mg/dL (ref 0.0–149.0)
VLDL: 15.6 mg/dL (ref 0.0–40.0)

## 2024-05-13 LAB — POCT INR: INR: 2.7 (ref 2.0–3.0)

## 2024-05-13 LAB — TSH: TSH: 1.26 u[IU]/mL (ref 0.35–5.50)

## 2024-05-13 LAB — HEMOGLOBIN A1C: Hgb A1c MFr Bld: 6.2 % (ref 4.6–6.5)

## 2024-05-13 MED ORDER — SHINGRIX 50 MCG/0.5ML IM SUSR: 0.5000 mL | Freq: Once | INTRAMUSCULAR | 1 refills | Status: AC

## 2024-05-13 MED ORDER — DOXAZOSIN MESYLATE ER 4 MG PO TB24: 4.0000 mg | ORAL_TABLET | Freq: Every day | ORAL | 0 refills | Status: DC

## 2024-05-13 MED ORDER — COVID-19 MRNA VAC-TRIS(PFIZER) 30 MCG/0.3ML IM SUSY: 0.3000 mL | PREFILLED_SYRINGE | Freq: Once | INTRAMUSCULAR | 0 refills | Status: AC

## 2024-05-13 NOTE — Patient Instructions (Addendum)
 Pre visit review using our clinic review tool, if applicable. No additional management support is needed unless otherwise documented below in the visit note.  Continue 1 1/2 tablets daily except take 1 tablet on Tuesday. Recheck in 4 weeks.

## 2024-05-13 NOTE — Progress Notes (Signed)
 Pt also has apt with PCP today. Continue 1 1/2 tablets daily except take 1 tablet on Tuesday. Recheck in 4 weeks. Pt requested high dose flu vaccine. Administered vaccine. Pt tolerated well.

## 2024-05-13 NOTE — Patient Instructions (Signed)
 Bradycardia, Adult Bradycardia is a slower-than-normal heartbeat. A normal resting heart rate for an adult ranges from 60 to 100 beats per minute. With bradycardia, the resting heart rate is less than 60 beats per minute. Bradycardia can prevent enough oxygen  from reaching certain areas of your body when you are active. It can be serious if it keeps enough oxygen  from reaching your brain and other parts of your body. Bradycardia is not a problem for everyone. For some healthy adults, a slow resting heart rate is normal. What are the causes? This condition may be caused by: A problem with the heart, including: A problem with the heart's electrical system, such as a heart block. With a heart block, electrical signals between the chambers of the heart are partially or completely blocked, so they are not able to work as they should. A problem with the heart's natural pacemaker (sinus node). Heart disease. A heart attack. Heart damage. Lyme disease. A heart infection. A heart condition that is present at birth (congenital heart defect). Certain medicines that treat heart conditions. Certain conditions, such as hypothyroidism and obstructive sleep apnea. Problems with the balance of chemicals and other substances, like potassium, in the blood. Trauma. Radiation therapy. What increases the risk? You are more likely to develop this condition if you: Are age 30 or older. Have high blood pressure (hypertension), high cholesterol (hyperlipidemia), or diabetes. Drink heavily, use tobacco or nicotine products, or use drugs. What are the signs or symptoms? Symptoms of this condition include: Light-headedness. Feeling faint or fainting. Fatigue and weakness. Trouble with activity or exercise. Shortness of breath. Chest pain (angina). Drowsiness. Confusion. Dizziness. How is this diagnosed? This condition may be diagnosed based on: Your symptoms. Your medical history. A physical exam. During  the exam, your health care provider will listen to your heartbeat and check your pulse. To confirm the diagnosis, your health care provider may order tests, such as: Blood tests. An electrocardiogram (ECG). This test records the heart's electrical activity. The test can show how fast your heart is beating and whether the heartbeat is steady. A test in which you wear a portable device (event recorder or Holter monitor) to record your heart's electrical activity while you go about your day. An exercise test. How is this treated? Treatment for this condition depends on the cause of the condition and how severe your symptoms are. Treatment may involve: Treatment of the underlying condition. Changing your medicines or how much medicine you take. Having a small, battery-operated device called a pacemaker implanted under the skin. When bradycardia occurs, this device can be used to increase your heart rate and help your heart beat in a regular rhythm. Follow these instructions at home: Lifestyle Manage any health conditions that contribute to bradycardia as told by your health care provider. Follow a heart-healthy diet. A nutrition specialist (dietitian) can help educate you about healthy food options and changes. Follow an exercise program that is approved by your health care provider. Maintain a healthy weight. Try to reduce or manage your stress, such as with yoga or meditation. If you need help reducing stress, ask your health care provider. Do not use any products that contain nicotine or tobacco. These products include cigarettes, chewing tobacco, and vaping devices, such as e-cigarettes. If you need help quitting, ask your health care provider. Do not use illegal drugs. Alcohol  use If you drink alcohol : Limit how much you have to: 0-1 drink a day for women who are not pregnant. 0-2 drinks a day  for men. Know how much alcohol  is in a drink. In the [place] one drink equals one 12 oz bottle of  beer (355 mL), one 5 oz glass of wine (148 mL), or one 1 oz glass of hard liquor (44 mL). General instructions Take over-the-counter and prescription medicines only as told by your health care provider. Keep all follow-up visits. This is important. How is this prevented? In some cases, bradycardia may be prevented by: Treating underlying medical problems. Stopping behaviors or medicines that can trigger the condition. Contact a health care provider if: You feel light-headed or dizzy. You almost faint. You feel weak or are easily fatigued during physical activity. You experience confusion or have memory problems. Get help right away if: You faint. You have chest pains or an irregular heartbeat (palpitations). You have trouble breathing. These symptoms may represent a serious problem that is an emergency. Do not wait to see if the symptoms will go away. Get medical help right away. Call your local emergency services (911 in the [place] Do not drive yourself to the hospital. Summary Bradycardia is a slower-than-normal heartbeat. With bradycardia, the resting heart rate is less than 60 beats per minute. Treatment for this condition depends on the cause. Manage any health conditions that contribute to bradycardia as told by your health care provider. Do not use any products that contain nicotine or tobacco. These products include cigarettes, chewing tobacco, and vaping devices, such as e-cigarettes. Keep all follow-up visits. This is important. This information is not intended to replace advice given to you by your health care provider. Make sure you discuss any questions you have with your health care provider. Document Revised: 11/14/2020 Document Reviewed: 11/14/2020 Elsevier Patient Education  2024 [employer].

## 2024-05-13 NOTE — Progress Notes (Unsigned)
 Subjective:  Patient ID: Rick Edwards, male    DOB: 1955-09-22  Age: 68 y.o. MRN: [id number]  CC: Hypertension and Hyperlipidemia   HPI [name] presents for f/up ----  Discussed the use of AI scribe software for clinical note transcription with the patient, who gave verbal consent to proceed.  History of Present Illness Rick Edwards is a 68 year old male who presents with right ear pain and shoulder discomfort.  He has been experiencing right ear pain for about a week, which he attributes to earwax buildup. His hearing aid has been malfunctioning, producing a whistling noise. He has attempted to clean the wax himself.  The discomfort extends from his right ear down to his shoulder and chest. No headache, blurred vision, chest pain, or shortness of breath. He feels something around his neck and upper shoulders but is unsure about any swelling.  He has not sustained any recent injuries but speculates that exposure to air conditioning at his girlfriend's place over the past two weeks might have contributed to his symptoms.  He recently had lab work done by his kidney doctor but did not bring the results to this visit. He received a flu shot. He fell asleep with his clothes on last night.     Outpatient Medications Prior to Visit  Medication Sig Dispense Refill   allopurinol  (ZYLOPRIM ) 300 MG tablet TAKE 1 TABLET BY MOUTH EVERY DAY 90 tablet 1   amLODipine  (NORVASC ) 10 MG tablet TAKE 1 TABLET BY MOUTH EVERYDAY AT BEDTIME 90 tablet 1   atorvastatin  (LIPITOR) 40 MG tablet TAKE 1 TABLET BY MOUTH EVERY DAY 90 tablet 1   carvedilol  (COREG ) 25 MG tablet TAKE 1 TABLET (25 MG TOTAL) BY MOUTH TWICE A DAY WITH MEALS 180 tablet 1   ciclopirox  (LOPROX ) 0.77 % cream Apply topically 2 (two) times daily. 90 g 2   enoxaparin  (LOVENOX ) 150 MG/ML injection Inject 1 mL (150 mg total) into the skin daily. USE AS DIRECTED BY ANTICOAGULATION CLINIC 8 mL 0   hyoscyamine (LEVSIN  SL) 0.125 MG SL tablet Place under the tongue 2 (two) times daily as needed.     spironolactone  (ALDACTONE ) 25 MG tablet Take 1 tablet (25 mg total) by mouth daily. 90 tablet 3   torsemide  (DEMADEX ) 20 MG tablet TAKE 1 TABLET BY MOUTH EVERY DAY 90 tablet 0   triamcinolone  cream (KENALOG ) 0.5 % Apply 1 Application topically 3 (three) times daily. 30 g 2   VASCEPA  1 g capsule TAKE 2 CAPSULES BY MOUTH 2 TIMES DAILY. 360 capsule 1   warfarin (COUMADIN ) 5 MG tablet TAKE 1 1/2 TABLETS BY MOUTH DAILY EXCEPT TAKE 1 TABLET ON TUESDAY, THURSDAY AND SATURDAY OR AS DIRECTED BY CLINIC. 120 tablet 1   No facility-administered medications prior to visit.    ROS Review of Systems  HENT:  Positive for ear pain and hearing loss.     Objective:  BP (!) 158/92 (BP Location: Left Arm, Patient Position: Sitting, Cuff Size: Normal) Comment: BP 158/94 (R)  Pulse 90   Temp 98.6 F (37 C) (Oral)   Ht 5' 9 (1.753 m)   Wt 202 lb (91.6 kg)   SpO2 97%   BMI 29.83 kg/m   BP Readings from Last 3 Encounters:  05/13/24 (!) 158/92  01/16/24 (!) 151/89  12/12/23 (!) 129/95    Wt Readings from Last 3 Encounters:  05/13/24 202 lb (91.6 kg)  01/16/24 207 lb (93.9 kg)  12/12/23 200  lb (90.7 kg)    Physical Exam Vitals reviewed.  Constitutional:      General: He is not in acute distress.    Appearance: He is not toxic-appearing or diaphoretic.  HENT:     Right Ear: Decreased hearing noted. There is impacted cerumen.     Left Ear: Decreased hearing noted. There is impacted cerumen.     Ears:     Comments: I was not able to remove the cerumen    Mouth/Throat:     Mouth: Mucous membranes are moist.  Eyes:     General: No scleral icterus.    Conjunctiva/sclera: Conjunctivae normal.  Cardiovascular:     Rate and Rhythm: Regular rhythm. Bradycardia present.     Heart sounds: No murmur heard.    No friction rub. No gallop.     Comments: EKG--- SB with SA (new), 55 bpm LAFB - old No LVH or Q  waves Pulmonary:     Effort: Pulmonary effort is normal.     Breath sounds: No stridor. No wheezing, rhonchi or rales.  Abdominal:     General: Abdomen is flat.     Palpations: There is no mass.     Tenderness: There is no abdominal tenderness. There is no guarding.     Hernia: No hernia is present.  Musculoskeletal:        General: Normal range of motion.     Right lower leg: No edema.     Left lower leg: No edema.  Skin:    General: Skin is warm and dry.  Neurological:     General: No focal deficit present.     Mental Status: He is alert.  Psychiatric:        Mood and Affect: Mood normal.        Behavior: Behavior normal.     Lab Results  Component Value Date   WBC 6.2 10/24/2023   HGB 16.0 10/24/2023   HCT 47.5 10/24/2023   PLT 349.0 10/24/2023   GLUCOSE 100 (H) 10/24/2023   CHOL 138 05/13/2024   TRIG 78.0 05/13/2024   HDL 43.90 05/13/2024   LDLDIRECT 97.0 05/05/2019   LDLCALC 79 05/13/2024   ALT 22 05/13/2024   AST 14 05/13/2024   NA 137 10/24/2023   K 3.4 (L) 10/24/2023   CL 97 10/24/2023   CREATININE 1.01 10/24/2023   BUN 13 10/24/2023   CO2 33 (H) 10/24/2023   TSH 1.26 05/13/2024   PSA 2.94 05/13/2024   INR 2.7 05/13/2024   HGBA1C 6.2 05/13/2024    No results found.  Assessment & Plan:  Hypertension, unspecified type -     EKG 12-Lead  Bilateral hearing loss due to cerumen impaction -     Ambulatory referral to ENT  Bradycardia with 41-50 beats per minute -     Ambulatory referral to Cardiology  Elevated PSA -     PSA; Future  Essential hypertension, malignant -     Doxazosin Mesylate ER; Take 1 tablet (4 mg total) by mouth daily with breakfast.  Dispense: 90 tablet; Refill: 0 -     AMB Referral VBCI Care Management  Hyperlipidemia with target LDL less than 70 -     TSH; Future -     Lipid panel; Future -     Hepatic function panel; Future  Pure hypertriglyceridemia -     Lipid panel; Future  Type II diabetes mellitus with  manifestations (HCC) -     Hemoglobin A1c; Future  Stage  3a chronic kidney disease (HCC)  BPH without obstruction/lower urinary tract symptoms -     Doxazosin Mesylate ER; Take 1 tablet (4 mg total) by mouth daily with breakfast.  Dispense: 90 tablet; Refill: 0  Need for prophylactic vaccination and inoculation against varicella -     Shingrix ; Inject 0.5 mLs into the muscle once for 1 dose.  Dispense: 0.5 mL; Refill: 1  LAFB (left anterior fascicular block) -     Ambulatory referral to Cardiology  Other orders -     COVID-19 mRNA Vac-TriS(Pfizer); Inject 0.3 mLs into the muscle once for 1 dose.  Dispense: 0.3 mL; Refill: 0     Follow-up: Return in about 3 months (around 08/13/2024).  Debby Molt, MD

## 2024-05-15 ENCOUNTER — Telehealth: Payer: Self-pay | Admitting: *Deleted

## 2024-05-15 NOTE — Progress Notes (Signed)
 Care Guide Pharmacy Note  05/15/2024 Name: Rick Edwards MRN: [id number] DOB: Sep 25, 1955  Referred By: Joshua Debby CROME, MD Reason for referral: Call Attempt #1 and Complex Care Management (Outreach to schedule referral with pharmacist )   Rick Edwards is a 68 y.o. year old male who is a primary care patient of Joshua Debby CROME, MD.  Rick Edwards was referred to the pharmacist for assistance related to: HTN  An unsuccessful telephone outreach was attempted today to contact the patient who was referred to the pharmacy team for assistance with medication management. Additional attempts will be made to contact the patient.  Thedford Franks, CMA [facility], [facility] Guide Direct Dial: [phone]  Fax: [phone] Website: [email or web address]

## 2024-05-15 NOTE — Progress Notes (Signed)
 Care Guide Pharmacy Note  05/15/2024 Name: BAYLOR CORTEZ MRN: [id number] DOB: 1955-10-03  Referred By: Joshua Debby CROME, MD Reason for referral: Call Attempt #1 and Complex Care Management (Outreach to schedule referral with pharmacist )   KAPIL PETROPOULOS is a 68 y.o. year old male who is a primary care patient of Joshua Debby CROME, MD.  Ellaree LELON Kuc was referred to the pharmacist for assistance related to: HTN  Successful contact was made with the patient to discuss pharmacy services including being ready for the pharmacist to call at least 5 minutes before the scheduled appointment time and to have medication bottles and any blood pressure readings ready for review. The patient agreed to meet with the pharmacist via telephone visit on (05/26/2024  Thedford Franks, CMA, Care Guide [facility], [facility] Guide Direct Dial: [phone]  Fax: [phone] Website: [email or web address]

## 2024-05-22 ENCOUNTER — Ambulatory Visit (INDEPENDENT_AMBULATORY_CARE_PROVIDER_SITE_OTHER): Admitting: Physician Assistant

## 2024-05-22 VITALS — BP 132/86 | HR 65 | Ht 69.0 in | Wt 202.0 lb

## 2024-05-22 DIAGNOSIS — H6121 Impacted cerumen, right ear: Secondary | ICD-10-CM | POA: Diagnosis not present

## 2024-05-22 NOTE — Progress Notes (Signed)
 Dear Dr. Joshua, Here is my assessment for our mutual patient, Rick Edwards. Thank you for allowing me the opportunity to care for your patient. Please do not hesitate to contact me should you have any other questions. Sincerely, Chyrl Cohen PA-C  Otolaryngology Clinic Note Referring provider: Dr. Joshua HPI:  Rick Edwards is a 68 y.o. male kindly referred by Dr. Joshua   The patient is a 68 year old gentleman seen in our office for evaluation of cerumen impaction.  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  He notes decreased hearing on the left and wears hearing aids.  He notes he saw his primary care who noted cerumen in the right external auditory canal, they attempted removal this was unsuccessful.  He denies any pain, no drainage, no other significant concerns today.    Independent Review of Additional Tests or Records:  None   PMH/Meds/All/SocHx/FamHx/ROS:   Past Medical History:  Diagnosis Date   Anemia    anemia thrombocytopenia, saw Hematology 2011, can not r/o myeloproliferative d/c   Antithrombin III  deficiency 05/16/2013   On coumadin  for this   Blood clot in vein    in right leg   Chronic kidney disease 05/27/2013   ACUTE RENAL FAILURE    Crohn's disease (HCC)    tx. Imuran    GI bleed 6/11   w/ normal EGD 6/11, 3 PRBCs    Hearing loss    wears hearing aid left ear; birthed with hearing loss   HOH (hard of hearing) 05/16/2013   Hypercalcemia    Hyperkalemia 05/27/2013   Hypertension    Ileostomy in place [facility]) 04-11-13   04-18-13 ileostomy to be taken down.   Perianal abscess 2001   s/p right hemicolectomy, and drainage of retroperitoneal abcess 2003   Peripheral vascular disease    Transfusion history    last 8'13     Past Surgical History:  Procedure Laterality Date   Anal fistulotomy  2002   EMBOLECTOMY  03/12/2012   Procedure: EMBOLECTOMY;  Surgeon: Lonni GORMAN Blade, MD;  Location: [facility];  Service:  Vascular;  Laterality: Right;  Right popliteal embolectomy with vein patch angioplasty, right posterior tibial embolectomy with vein patch angioplasty    ESOPHAGOGASTRODUODENOSCOPY N/A 12/12/2023   Procedure: EGD (ESOPHAGOGASTRODUODENOSCOPY);  Surgeon: Burnette Fallow, MD;  Location: [facility];  Service: Gastroenterology;  Laterality: N/A;   HEMICOLECTOMY  08/29/2001   perforated abscess   ILEOSTOMY CLOSURE N/A 04/18/2013   Procedure: ILEOSTOMY TAKEDOWN;  Surgeon: Morene ONEIDA Olives, MD;  Location: [facility] ORS;  Service: General;  Laterality: N/A;   INTRAOPERATIVE ARTERIOGRAM  03/12/2012   Procedure: INTRA OPERATIVE ARTERIOGRAM;  Surgeon: Lonni GORMAN Blade, MD;  Location: [facility] OR;  Service: Vascular;  Laterality: Right;   PARTIAL COLECTOMY  03/11/2012   Procedure: PARTIAL COLECTOMY;  Surgeon: Morene ONEIDA Olives, MD;  Location: [facility]S;  Service: General;  Laterality: N/A;  subtotal colectomy transverse and left colon    TRANSMETATARSAL AMPUTATION  05/10/2012   right   UPPER ESOPHAGEAL ENDOSCOPIC ULTRASOUND (EUS) N/A 12/12/2023   Procedure: UPPER ESOPHAGEAL ENDOSCOPIC ULTRASOUND (EUS);  Surgeon: Burnette Fallow, MD;  Location: [facility];  Service: Gastroenterology;  Laterality: N/A;    Family History  Problem Relation Age of Onset   Hypertension Mother    Hyperlipidemia Mother    Hypertension Father    Hyperlipidemia Father    Heart disease Father    Stroke Other        GF, aunts  Hypertension Other        the whole family   Coronary artery disease Neg Hx    Diabetes Neg Hx    Colon cancer Neg Hx    Prostate cancer Neg Hx    Kidney failure Neg Hx      Social Connections: Moderately Isolated (06/11/2023)   Social Connection and Isolation Panel    Frequency of Communication with Friends and Family: More than three times a week    Frequency of Social Gatherings with Friends and Family: Never    Attends Religious Services: More than 4 times per year    Active Member of [employer] or  Organizations: No    Attends Engineer, structural: Never    Marital Status: Never married      Current Outpatient Medications:    allopurinol  (ZYLOPRIM ) 300 MG tablet, TAKE 1 TABLET BY MOUTH EVERY DAY, Disp: 90 tablet, Rfl: 1   amLODipine  (NORVASC ) 10 MG tablet, TAKE 1 TABLET BY MOUTH EVERYDAY AT BEDTIME, Disp: 90 tablet, Rfl: 1   atorvastatin  (LIPITOR) 40 MG tablet, TAKE 1 TABLET BY MOUTH EVERY DAY, Disp: 90 tablet, Rfl: 1   carvedilol  (COREG ) 25 MG tablet, TAKE 1 TABLET (25 MG TOTAL) BY MOUTH TWICE A DAY WITH MEALS, Disp: 180 tablet, Rfl: 1   ciclopirox  (LOPROX ) 0.77 % cream, Apply topically 2 (two) times daily., Disp: 90 g, Rfl: 2   doxazosin (CARDURA XL) 4 MG 24 hr tablet, Take 1 tablet (4 mg total) by mouth daily with breakfast., Disp: 90 tablet, Rfl: 0   enoxaparin  (LOVENOX ) 150 MG/ML injection, Inject 1 mL (150 mg total) into the skin daily. USE AS DIRECTED BY ANTICOAGULATION CLINIC, Disp: 8 mL, Rfl: 0   hyoscyamine (LEVSIN SL) 0.125 MG SL tablet, Place under the tongue 2 (two) times daily as needed., Disp: , Rfl:    spironolactone  (ALDACTONE ) 25 MG tablet, Take 1 tablet (25 mg total) by mouth daily., Disp: 90 tablet, Rfl: 3   torsemide  (DEMADEX ) 20 MG tablet, TAKE 1 TABLET BY MOUTH EVERY DAY, Disp: 90 tablet, Rfl: 0   triamcinolone  cream (KENALOG ) 0.5 %, Apply 1 Application topically 3 (three) times daily., Disp: 30 g, Rfl: 2   VASCEPA  1 g capsule, TAKE 2 CAPSULES BY MOUTH 2 TIMES DAILY., Disp: 360 capsule, Rfl: 1   warfarin (COUMADIN ) 5 MG tablet, TAKE 1 1/2 TABLETS BY MOUTH DAILY EXCEPT TAKE 1 TABLET ON TUESDAY, THURSDAY AND SATURDAY OR AS DIRECTED BY CLINIC., Disp: 120 tablet, Rfl: 1   Physical Exam:   BP 132/86   Pulse 65   Ht 5' 9 (1.753 m)   Wt 202 lb (91.6 kg)   SpO2 95%   BMI 29.83 kg/m   Pertinent Findings  CN II-XII intact Right EAC with cerumen impaction, left EAC clear, TM intact with well-pneumatized middle ear space Anterior rhinoscopy: Septum midline;  bilateral inferior turbinates with no hypertrophy No lesions of oral cavity/oropharynx; dentition normal limits No obviously palpable neck masses/lymphadenopathy/thyromegaly No respiratory distress or stridor  Seprately Identifiable Procedures:  Procedure: bilateral ear microscopy and cerumen removal using microscope (CPT 30789) - Mod 25 Pre-procedure diagnosis: unilateral cerumen impaction right external auditory canal Post-procedure diagnosis: same Indication: bilateral cerumen impaction; given patient's otologic complaints and history as well as for improved and comprehensive examination of external ear and tympanic membrane, bilateral otologic examination using microscope was performed and impacted cerumen removed  Procedure: Patient was placed semi-recumbent. Both ear canals were examined using the microscope with findings above. Cerumen  removed from the right external auditory canal using suction and currette with improvement in EAC examination and patency. Left: EAC was patent. TM was intact . Middle ear was aerated. Drainage: none Right: EAC was patent. TM was intact . Middle ear was aerated . Drainage: none Patient tolerated the procedure well.   Impression & Plans:  Rick Edwards is a 68 y.o. male with the following   Cerumen impaction-  Removed without difficulty, patient feels his left-sided hearing is at baseline.  Follow-up PRN.   - f/u PRN   Thank you for allowing me the opportunity to care for your patient. Please do not hesitate to contact me should you have any other questions.  Sincerely, Chyrl Cohen PA-C [facility] Phone: [phone] Fax: [phone]  05/22/2024, 1:35 PM

## 2024-05-26 ENCOUNTER — Encounter: Payer: Self-pay | Admitting: Pharmacist

## 2024-05-26 ENCOUNTER — Other Ambulatory Visit (INDEPENDENT_AMBULATORY_CARE_PROVIDER_SITE_OTHER): Admitting: Pharmacist

## 2024-05-26 DIAGNOSIS — I10 Essential (primary) hypertension: Secondary | ICD-10-CM

## 2024-05-26 MED ORDER — DOXAZOSIN MESYLATE 4 MG PO TABS: 4.0000 mg | ORAL_TABLET | Freq: Every day | ORAL | 1 refills | Status: AC

## 2024-05-26 NOTE — Patient Instructions (Signed)
 It was a pleasure speaking with you today!  Start doxazosin 4 mg every night. Monitor blood pressures and notify us  if you are having low blood pressure symptoms.  Feel free to call with any questions or concerns!  Darrelyn Drum, PharmD, BCPS, CPP Clinical Pharmacist Practitioner [facility] [phone]

## 2024-05-26 NOTE — Progress Notes (Signed)
 05/26/2024 Name: Rick Edwards MRN: [id number] DOB: 1956/03/05  Chief Complaint  Patient presents with   Hypertension   Medication Management    Rick Edwards is a 68 y.o. year old male who presented for a telephone visit.   They were referred to the pharmacist by their PCP for assistance in managing diabetes and hypertension.    Subjective:  Care Team: Primary Care Provider: Joshua Debby CROME, MD ; Next Scheduled Visit: 07/26/2023  Medication Access/Adherence  Current Pharmacy:  [phone], [place] - [address] [place] [address] Phone: [phone] Fax: [phone]  [facility] - [place], [place] - [address] [place] [address] Phone: [phone] Fax: [phone]   Patient reports affordability concerns with their medications: No  Patient reports access/transportation concerns to their pharmacy: No  Patient reports adherence concerns with their medications:  No     Hypertension:  Current medications: amlodipine  10 mg daily, carvedilol  25 mg twice daily, spironolactone  25 mg daily Previous medications: stopped olmesartan  (AKI and hypotension), ACE inhibitor (severe AKI requiring HD), torsemide  (pt reports provider stopped it)  **He has not started doxazosin, was not aware a new medication was started and notes the pharmacy has not filled anything for him  Patient does not have a validated, automated, upper arm home BP cuff. He goes to the pharmacy to check his BP. Current blood pressure reading: 132-133/83-86 when checks at the pharmacy   Patient denies hypotensive s/sx including dizziness, lightheadedness.    Objective:  BP Readings from Last 3 Encounters:  05/22/24 132/86  05/13/24 (!) 158/92  01/16/24 (!) 151/89     Lab Results  Component Value Date   HGBA1C 6.2 05/13/2024    Lab Results  Component Value Date   CREATININE 1.01 10/24/2023   BUN 13 10/24/2023    NA 137 10/24/2023   K 3.4 (L) 10/24/2023   CL 97 10/24/2023   CO2 33 (H) 10/24/2023    Lab Results  Component Value Date   CHOL 138 05/13/2024   HDL 43.90 05/13/2024   LDLCALC 79 05/13/2024   LDLDIRECT 97.0 05/05/2019   TRIG 78.0 05/13/2024   CHOLHDL 3 05/13/2024    Medications Reviewed Today     Reviewed by Merceda Lela SAUNDERS, RPH (Pharmacist) on 05/26/24 at 1548  Med List Status: <None>   Medication Order Taking? Sig Documenting Provider Last Dose Status Informant  allopurinol  (ZYLOPRIM ) 300 MG tablet [id number]  TAKE 1 TABLET BY MOUTH EVERY DAY Rick Debby CROME, MD  Active   amLODipine  (NORVASC ) 10 MG tablet [id number] Yes TAKE 1 TABLET BY MOUTH EVERYDAY AT BEDTIME Rick Debby CROME, MD  Active   atorvastatin  (LIPITOR) 40 MG tablet [id number]  TAKE 1 TABLET BY MOUTH EVERY DAY Rick Debby CROME, MD  Active   carvedilol  (COREG ) 25 MG tablet [id number] Yes TAKE 1 TABLET (25 MG TOTAL) BY MOUTH TWICE A DAY WITH MEALS Rick Debby CROME, MD  Active   ciclopirox  (LOPROX ) 0.77 % cream [id number]  Apply topically 2 (two) times daily. Rick Debby CROME, MD  Active   doxazosin (CARDURA XL) 4 MG 24 hr tablet [id number]  Take 1 tablet (4 mg total) by mouth daily with breakfast.  Patient not taking: Reported on 05/26/2024   Rick Debby CROME, MD  Active   enoxaparin  (LOVENOX ) 150 MG/ML injection [id number]  Inject 1 mL (150 mg total) into the skin daily. USE AS DIRECTED BY ANTICOAGULATION CLINIC Rick Debby CROME, MD  Active   hyoscyamine (LEVSIN SL) 0.125 MG SL tablet [id number]  Place under the tongue 2 (two) times daily as needed. [provider]  Active            Med Note MARLAND BEATRIS CHRISTELLA [place] Mar 17, 2021  2:29 PM) 03/17/21: Reports has not needed recently  spironolactone  (ALDACTONE ) 25 MG tablet [id number] Yes Take 1 tablet (25 mg total) by mouth daily. Rick Debby CROME, MD  Active   torsemide  (DEMADEX ) 20 MG tablet [id number]  TAKE 1 TABLET BY MOUTH EVERY DAY  Patient not taking: Reported on  05/26/2024   Rick Debby CROME, MD  Consider Medication Status and Discontinue (Discontinued by provider)   triamcinolone  cream (KENALOG ) 0.5 % [id number]  Apply 1 Application topically 3 (three) times daily. Rick Debby CROME, MD  Active   VASCEPA  1 g capsule [id number]  TAKE 2 CAPSULES BY MOUTH 2 TIMES DAILY. Rick Debby CROME, MD  Active   warfarin (COUMADIN ) 5 MG tablet [id number]  TAKE 1 1/2 TABLETS BY MOUTH DAILY EXCEPT TAKE 1 TABLET ON TUESDAY, THURSDAY AND SATURDAY OR AS DIRECTED BY CLINIC. Rick Debby CROME, MD  Active   Med List Note Roselee Teena BRAVO, CPhT 05/10/12 1734): Patient family member states that doctor wanted him to stop the coumadin  before procedure.               Assessment/Plan:   Hypertension: - BP uncontrolled, BP goal <130/80. BP was 132/86 and 133/84 at recent appointments. BP has been better controlled however remains above goal. - Added olmesartan  to allergy list given history of AKI and hypotension  - Contacted pharmacy, they noted the extended release is only available as brand name and is not covered by pt insurance. It is generic and $10 if sent as the non-extended release version. - Sent doxazosin 4 mg Rx - Advised pt to monitor BP at starting new medication and contact us  if having hypotension symptoms    Follow Up Plan: PRN  Darrelyn Drum, PharmD, BCPS Clinical Pharmacist [facility] [phone]

## 2024-06-10 ENCOUNTER — Ambulatory Visit (INDEPENDENT_AMBULATORY_CARE_PROVIDER_SITE_OTHER)

## 2024-06-10 DIAGNOSIS — Z7901 Long term (current) use of anticoagulants: Secondary | ICD-10-CM | POA: Diagnosis not present

## 2024-06-10 LAB — POCT INR: INR: 3.4 — AB (ref 2.0–3.0)

## 2024-06-10 NOTE — Patient Instructions (Addendum)
 Pre visit review using our clinic review tool, if applicable. No additional management support is needed unless otherwise documented below in the visit note.  Hold warfarin today and then change weekly dose to take 1 1/2 tablets daily except take 1 tablet on Tuesday and Friday. Recheck in 2 weeks.

## 2024-06-10 NOTE — Progress Notes (Signed)
 Pt denies any changes. Hold warfarin today and then change weekly dose to take 1 1/2 tablets daily except take 1 tablet on Tuesday and Friday. Recheck in 2 weeks.

## 2024-06-12 ENCOUNTER — Ambulatory Visit (INDEPENDENT_AMBULATORY_CARE_PROVIDER_SITE_OTHER)

## 2024-06-12 VITALS — BP 138/72 | HR 65 | Ht 69.0 in | Wt 208.0 lb

## 2024-06-12 DIAGNOSIS — Z0000 Encounter for general adult medical examination without abnormal findings: Secondary | ICD-10-CM | POA: Diagnosis not present

## 2024-06-12 NOTE — Progress Notes (Signed)
 Subjective:   Rick Edwards is a 68 y.o. male who presents for a Medicare Annual Wellness Visit.  Allergies (verified) Olmesartan  and Mesalamine   History: Past Medical History:  Diagnosis Date   Anemia    anemia thrombocytopenia, saw Hematology 2011, can not r/o myeloproliferative d/c   Antithrombin III  deficiency 05/16/2013   On coumadin  for this   Blood clot in vein    in right leg   Chronic kidney disease 05/27/2013   ACUTE RENAL FAILURE    Crohn's disease (HCC)    tx. Imuran    GI bleed 6/11   w/ normal EGD 6/11, 3 PRBCs    Hearing loss    wears hearing aid left ear; birthed with hearing loss   HOH (hard of hearing) 05/16/2013   Hypercalcemia    Hyperkalemia 05/27/2013   Hypertension    Ileostomy in place [facility]) 04-11-13   04-18-13 ileostomy to be taken down.   Perianal abscess 2001   s/p right hemicolectomy, and drainage of retroperitoneal abcess 2003   Peripheral vascular disease    Transfusion history    last 8'13   Past Surgical History:  Procedure Laterality Date   Anal fistulotomy  2002   EMBOLECTOMY  03/12/2012   Procedure: EMBOLECTOMY;  Surgeon: Lonni GORMAN Blade, MD;  Location: [facility] OR;  Service: Vascular;  Laterality: Right;  Right popliteal embolectomy with vein patch angioplasty, right posterior tibial embolectomy with vein patch angioplasty    ESOPHAGOGASTRODUODENOSCOPY N/A 12/12/2023   Procedure: EGD (ESOPHAGOGASTRODUODENOSCOPY);  Surgeon: Burnette Fallow, MD;  Location: [facility];  Service: Gastroenterology;  Laterality: N/A;   HEMICOLECTOMY  08/29/2001   perforated abscess   ILEOSTOMY CLOSURE N/A 04/18/2013   Procedure: ILEOSTOMY TAKEDOWN;  Surgeon: Morene ONEIDA Olives, MD;  Location: [facility];  Service: General;  Laterality: N/A;   INTRAOPERATIVE ARTERIOGRAM  03/12/2012   Procedure: INTRA OPERATIVE ARTERIOGRAM;  Surgeon: Lonni GORMAN Blade, MD;  Location: [facility] OR;  Service: Vascular;  Laterality: Right;   PARTIAL COLECTOMY  03/11/2012   Procedure:  PARTIAL COLECTOMY;  Surgeon: Morene ONEIDA Olives, MD;  Location: [facility];  Service: General;  Laterality: N/A;  subtotal colectomy transverse and left colon    TRANSMETATARSAL AMPUTATION  05/10/2012   right   UPPER ESOPHAGEAL ENDOSCOPIC ULTRASOUND (EUS) N/A 12/12/2023   Procedure: UPPER ESOPHAGEAL ENDOSCOPIC ULTRASOUND (EUS);  Surgeon: Burnette Fallow, MD;  Location: [facility];  Service: Gastroenterology;  Laterality: N/A;   Family History  Problem Relation Age of Onset   Hypertension Mother    Hyperlipidemia Mother    Hypertension Father    Hyperlipidemia Father    Heart disease Father    Stroke Other        GF, aunts    Hypertension Other        the whole family   Coronary artery disease Neg Hx    Diabetes Neg Hx    Colon cancer Neg Hx    Prostate cancer Neg Hx    Kidney failure Neg Hx    Social History   Occupational History   Occupation: Post office    Occupation: disablity  Tobacco Use   Smoking status: Former    Current packs/day: 0.00    Average packs/day: 0.3 packs/day for 9.0 years (2.3 ttl pk-yrs)    Types: Cigarettes    Start date: 05/08/2002    Quit date: 05/09/2011    Years since quitting: 13.1   Smokeless tobacco: Never  Vaping Use   Vaping status: Never Used  Substance and  Sexual Activity   Alcohol use: No    Alcohol/week: 0.0 standard drinks of alcohol   Drug use: Yes    Types: Marijuana   Sexual activity: Not Currently   Tobacco Counseling Counseling given: Not Answered  SDOH Screenings   Food Insecurity: No Food Insecurity (06/12/2024)  Housing: Unknown (06/12/2024)  Transportation Needs: No Transportation Needs (06/12/2024)  Utilities: Not At Risk (06/12/2024)  Alcohol Screen: Low Risk  (06/11/2023)  Depression (PHQ2-9): Low Risk  (06/12/2024)  Financial Resource Strain: Low Risk  (06/11/2023)  Physical Activity: Sufficiently Active (06/12/2024)  Social Connections: Moderately Integrated (06/12/2024)  Stress: No Stress Concern Present (06/12/2024)   Tobacco Use: Medium Risk (06/12/2024)  Health Literacy: Adequate Health Literacy (06/12/2024)   Depression Screen    06/12/2024    1:22 PM 10/24/2023    1:01 PM 07/16/2023   11:01 AM 06/11/2023    1:18 PM 05/08/2022    1:15 PM 01/12/2022    2:15 PM 03/17/2021    2:00 PM  PHQ 2/9 Scores  PHQ - 2 Score 0 0 0 0 0 0 0  PHQ- 9 Score 1   0         Data saved with a previous flowsheet row definition     Goals Addressed               This Visit's Progress     Patient Stated (pt-stated)        Patient stated he plans to continue walking and celebrate birthday today       Visit info / Clinical Intake: Medicare Wellness Visit Type:: Subsequent Annual Wellness Visit Medicare Wellness Visit Mode:: In-person (required for WTM) Interpreter Needed?: No Pre-visit prep was completed: yes AWV questionnaire completed by patient prior to visit?: no Living arrangements:: (!) lives alone Patient's Overall Health Status Rating: very good Typical amount of pain: none Does pain affect daily life?: no Are you currently prescribed opioids?: no  Dietary Habits and Nutritional Risks How many meals a day?: 3 Eats fruit and vegetables daily?: yes Most meals are obtained by: preparing own meals; eating out In the last 2 weeks, have you had any of the following?: -- (none)  Functional Status Activities of Daily Living (to include ambulation/medication): Independent Ambulation: Independent Home Assistive Devices/Equipment: None Medication Administration: Independent Home Management: Independent Manage your own finances?: yes Primary transportation is: driving Concerns about vision?: no *vision screening is required for WTM* Concerns about hearing?: no  Fall Screening Falls in the past year?: 0 Number of falls in past year: 0 Was there an injury with Fall?: 0 Fall Risk Category Calculator: 0 Patient Fall Risk Level: Low Fall Risk  Fall Risk Patient at Risk for Falls Due to: No Fall  Risks Fall risk Follow up: Falls evaluation completed; Falls prevention discussed  Home and Transportation Safety: All rugs have non-skid backing?: N/A, no rugs All stairs or steps have railings?: yes Grab bars in the bathtub or shower?: (!) no Have non-skid surface in bathtub or shower?: yes Good home lighting?: yes Regular seat belt use?: yes Hospital stays in the last year:: no  Cognitive Assessment Difficulty concentrating, remembering, or making decisions? : no Will 6CIT or Mini Cog be Completed: yes What year is it?: 0 points What month is it?: 0 points Give patient an address phrase to remember (5 components): [address] [place], [place] About what time is it?: 0 points Count backwards from 20 to 1: 0 points Say the months of the year in reverse:  0 points Repeat the address phrase from earlier: 0 points 6 CIT Score: 0 points  Advance Directives (For Healthcare) Does Patient Have a Medical Advance Directive?: No Would patient like information on creating a medical advance directive?: No - Patient declined  Reviewed/Updated  Reviewed/Updated: All        Objective:    Today's Vitals   06/12/24 1317 06/12/24 1330  BP: (!) 140/72 138/72  Pulse: 65   SpO2: 97%   Weight: 208 lb (94.3 kg)   Height: 5' 9 (1.753 m)    Body mass index is 30.72 kg/m.  Current Medications (verified) Outpatient Encounter Medications as of 06/12/2024  Medication Sig   allopurinol  (ZYLOPRIM ) 300 MG tablet TAKE 1 TABLET BY MOUTH EVERY DAY   amLODipine  (NORVASC ) 10 MG tablet TAKE 1 TABLET BY MOUTH EVERYDAY AT BEDTIME   atorvastatin  (LIPITOR) 40 MG tablet TAKE 1 TABLET BY MOUTH EVERY DAY   carvedilol  (COREG ) 25 MG tablet TAKE 1 TABLET (25 MG TOTAL) BY MOUTH TWICE A DAY WITH MEALS   ciclopirox  (LOPROX ) 0.77 % cream Apply topically 2 (two) times daily.   doxazosin (CARDURA) 4 MG tablet Take 1 tablet (4 mg total) by mouth at bedtime.   enoxaparin  (LOVENOX ) 150 MG/ML injection Inject 1 mL (150  mg total) into the skin daily. USE AS DIRECTED BY ANTICOAGULATION CLINIC   hyoscyamine (LEVSIN SL) 0.125 MG SL tablet Place under the tongue 2 (two) times daily as needed.   spironolactone  (ALDACTONE ) 25 MG tablet Take 1 tablet (25 mg total) by mouth daily.   triamcinolone  cream (KENALOG ) 0.5 % Apply 1 Application topically 3 (three) times daily.   VASCEPA  1 g capsule TAKE 2 CAPSULES BY MOUTH 2 TIMES DAILY.   warfarin (COUMADIN ) 5 MG tablet TAKE 1 1/2 TABLETS BY MOUTH DAILY EXCEPT TAKE 1 TABLET ON TUESDAY, THURSDAY AND SATURDAY OR AS DIRECTED BY CLINIC.   torsemide  (DEMADEX ) 20 MG tablet TAKE 1 TABLET BY MOUTH EVERY DAY (Patient not taking: Reported on 06/12/2024)   No facility-administered encounter medications on file as of 06/12/2024.   Hearing/Vision screen Hearing Screening - Comments:: Denies hearing difficulties   Vision Screening - Comments:: Wears eyeglasses for reading - up to date with routine eye exams with [facility] Immunizations and Health Maintenance Health Maintenance  Topic Date Due   Zoster Vaccines- Shingrix  (2 of 2) 06/20/2021   Diabetic kidney evaluation - Urine ACR  02/20/2024   COVID-19 Vaccine (4 - 2025-26 season) 04/07/2024   FOOT EXAM  06/11/2024   Diabetic kidney evaluation - eGFR measurement  10/23/2024   HEMOGLOBIN A1C  11/11/2024   OPHTHALMOLOGY EXAM  03/05/2025   Medicare Annual Wellness (AWV)  06/12/2025   DTaP/Tdap/Td (3 - Td or Tdap) 08/15/2028   Colonoscopy  09/24/2033   Pneumococcal Vaccine: 50+ Years  Completed   Influenza Vaccine  Completed   Hepatitis C Screening  Completed   Meningococcal B Vaccine  Aged Out        Assessment/Plan:  This is a routine wellness examination for [place].  Patient Care Team: Joshua Debby CROME, MD as PCP - General (Internal Medicine) Sheldon Standing, MD as Consulting Physician (General Surgery) [facility] (Ophthalmology) Merceda Lela SAUNDERS, [facility] (Pharmacist)  I have personally reviewed and noted  the following in the patient's chart:   Medical and social history Use of alcohol, tobacco or illicit drugs  Current medications and supplements including opioid prescriptions. Functional ability and status Nutritional status Physical activity Advanced directives List of other physicians  Hospitalizations, surgeries, and ER visits in previous 12 months Vitals Screenings to include cognitive, depression, and falls Referrals and appointments  No orders of the defined types were placed in this encounter.  In addition, I have reviewed and discussed with patient certain preventive protocols, quality metrics, and best practice recommendations. A written personalized care plan for preventive services as well as general preventive health recommendations were provided to patient.   Verdie CHRISTELLA Saba, CMA   06/12/2024   Return in 1 year (on 06/12/2025).  After Visit Summary: (In Person-Declined) Patient declined AVS at this time.  Nurse Notes: Scheduled a 6-mth appt w/PCP for 11/2024

## 2024-06-12 NOTE — Patient Instructions (Signed)
 Rick Edwards,  Thank you for taking the time for your Medicare Wellness Visit. I appreciate your continued commitment to your health goals. Please review the care plan we discussed, and feel free to reach out if I can assist you further.  Please note that Annual Wellness Visits do not include a physical exam. Some assessments may be limited, especially if the visit was conducted virtually. If needed, we may recommend an in-person follow-up with your provider.  Ongoing Care Seeing your primary care provider every 3 to 6 months helps us  monitor your health and provide consistent, personalized care.   Referrals If a referral was made during today's visit and you haven't received any updates within two weeks, please contact the referred provider directly to check on the status.  Recommended Screenings:  Health Maintenance  Topic Date Due   Zoster (Shingles) Vaccine (2 of 2) 06/20/2021   Yearly kidney health urinalysis for diabetes  02/20/2024   COVID-19 Vaccine (4 - 2025-26 season) 04/07/2024   Complete foot exam   06/11/2024   Yearly kidney function blood test for diabetes  10/23/2024   Hemoglobin A1C  11/11/2024   Eye exam for diabetics  03/05/2025   Medicare Annual Wellness Visit  06/12/2025   DTaP/Tdap/Td vaccine (3 - Td or Tdap) 08/15/2028   Colon Cancer Screening  09/24/2033   Pneumococcal Vaccine for age over 47  Completed   Flu Shot  Completed   Hepatitis C Screening  Completed   Meningitis B Vaccine  Aged Out       06/12/2024    1:18 PM  Advanced Directives  Does Patient Have a Medical Advance Directive? No  Would patient like information on creating a medical advance directive? No - Patient declined    Vision: Annual vision screenings are recommended for early detection of glaucoma, cataracts, and diabetic retinopathy. These exams can also reveal signs of chronic conditions such as diabetes and high blood pressure.  Dental: Annual dental screenings help detect early signs  of oral cancer, gum disease, and other conditions linked to overall health, including heart disease and diabetes.

## 2024-06-24 ENCOUNTER — Ambulatory Visit

## 2024-06-24 DIAGNOSIS — Z7901 Long term (current) use of anticoagulants: Secondary | ICD-10-CM | POA: Diagnosis not present

## 2024-06-24 LAB — POCT INR: INR: 2.5 (ref 2.0–3.0)

## 2024-06-24 NOTE — Progress Notes (Signed)
 Continue 1 1/2 tablets daily except take 1 tablet on Tuesday and Friday. Recheck in 4 weeks.

## 2024-06-24 NOTE — Patient Instructions (Addendum)
 Pre visit review using our clinic review tool, if applicable. No additional management support is needed unless otherwise documented below in the visit note.  Continue 1 1/2 tablets daily except take 1 tablet on Tuesday and Friday. Recheck in 4 weeks.

## 2024-07-07 ENCOUNTER — Encounter: Payer: Self-pay | Admitting: Cardiovascular Disease

## 2024-07-07 ENCOUNTER — Ambulatory Visit (HOSPITAL_COMMUNITY)
Admission: RE | Admit: 2024-07-07 | Discharge: 2024-07-07 | Disposition: A | Source: Ambulatory Visit | Attending: Cardiovascular Disease | Admitting: Cardiovascular Disease

## 2024-07-07 ENCOUNTER — Ambulatory Visit: Admitting: Cardiovascular Disease

## 2024-07-07 VITALS — BP 112/66 | HR 70 | Ht 69.0 in | Wt 203.4 lb

## 2024-07-07 DIAGNOSIS — R0989 Other specified symptoms and signs involving the circulatory and respiratory systems: Secondary | ICD-10-CM

## 2024-07-07 DIAGNOSIS — J90 Pleural effusion, not elsewhere classified: Secondary | ICD-10-CM | POA: Diagnosis not present

## 2024-07-07 DIAGNOSIS — I739 Peripheral vascular disease, unspecified: Secondary | ICD-10-CM

## 2024-07-07 DIAGNOSIS — I10 Essential (primary) hypertension: Secondary | ICD-10-CM | POA: Diagnosis not present

## 2024-07-07 DIAGNOSIS — R079 Chest pain, unspecified: Secondary | ICD-10-CM | POA: Diagnosis not present

## 2024-07-07 DIAGNOSIS — Z7901 Long term (current) use of anticoagulants: Secondary | ICD-10-CM | POA: Diagnosis not present

## 2024-07-07 DIAGNOSIS — D6859 Other primary thrombophilia: Secondary | ICD-10-CM | POA: Diagnosis not present

## 2024-07-07 DIAGNOSIS — K50114 Crohn's disease of large intestine with abscess: Secondary | ICD-10-CM | POA: Diagnosis not present

## 2024-07-07 DIAGNOSIS — K439 Ventral hernia without obstruction or gangrene: Secondary | ICD-10-CM | POA: Diagnosis not present

## 2024-07-07 DIAGNOSIS — E782 Mixed hyperlipidemia: Secondary | ICD-10-CM | POA: Diagnosis not present

## 2024-07-07 DIAGNOSIS — I743 Embolism and thrombosis of arteries of the lower extremities: Secondary | ICD-10-CM

## 2024-07-07 DIAGNOSIS — R9431 Abnormal electrocardiogram [ECG] [EKG]: Secondary | ICD-10-CM | POA: Diagnosis not present

## 2024-07-07 DIAGNOSIS — R091 Pleurisy: Secondary | ICD-10-CM | POA: Diagnosis not present

## 2024-07-07 LAB — CBC
Hematocrit: 41.4 % (ref 37.5–51.0)
Hemoglobin: 14.2 g/dL (ref 13.0–17.7)
MCH: 30.9 pg (ref 26.6–33.0)
MCHC: 34.3 g/dL (ref 31.5–35.7)
MCV: 90 fL (ref 79–97)
Platelets: 347 x10E3/uL (ref 150–450)
RBC: 4.59 x10E6/uL (ref 4.14–5.80)
RDW: 14.7 % (ref 11.6–15.4)
WBC: 6.3 x10E3/uL (ref 3.4–10.8)

## 2024-07-07 LAB — D-DIMER, QUANTITATIVE: D-DIMER: <0.2 mg{FEU}/L (ref 0.00–0.49)

## 2024-07-07 NOTE — Progress Notes (Unsigned)
 Cardiology Office Note:    Date:  07/09/2024   ID:  Rick Edwards, DOB 04-20-1956, MRN [id number]  PCP:  Joshua Debby CROME, MD   [facility] Providers Cardiologist:  None     Referring MD: Joshua Debby CROME, MD   Chief Complaint  Patient presents with   Consult  Rick Edwards is a 69 y.o. male who is being seen today for the evaluation of sinus bradycardia and left anterior fascicular block at the request of Joshua Debby CROME, MD.   History of Present Illness:    Rick Edwards is a 68 y.o. male with a hx of type 2 diabetes mellitus, hypercholesterolemia, hypertension, PAD with history of right lower limb tibioperoneal trunk embolectomy, right foot transmetatarsal amputation, history of Crohn's disease with previous right hemicolectomy in 2001 for retroperitoneal abscess, subsequent total abdominal colectomy end ileostomy 2013, ileostomy takedown 2014.    In August 2013 he also underwent an urgent right femoral and tibioperoneal trunk embolectomy for ischemic foot (and at that point his notes begin to state positive DVT, although I cannot find actual documentation of DVT).  At the same admission there is mention of a splenic infarct on CT, but no evidence of pulmonary embolism.  He was felt to have a hypercoagulable state and was on anticoagulants, which he continues to take.  Note that Dr. Cleo note from that embolectomy procedure states that the material removed from the femoral artery was white fibrinous material, that did not appear to be classic thrombus.  I can find the natural pathology report on that material.  His INR was briefly subtherapeutic in July-August, but over the 3 months his INR has been consistently in therapeutic range, just once supratherapeutic in early November.  He has a history of left anterior fascicular block, consistently present on ECGs since 2016. The most recent ECG on May 13, 2024, showed sinus bradycardia and left anterior fascicular  block. Previous ECGs from June 12, 2023, and earlier years also demonstrated left anterior fascicular block, with sinus rhythm at 60 beats per minute noted in 2024.  In 2013, an echocardiogram showed normal left ventricular systolic function with an ejection fraction of 60 to 65 percent and aortic valve sclerosis without stenosis. A CT scan of the abdomen and pelvis in December 2024 revealed atherosclerosis in the descending thoracic and abdominal aorta and iliac branches, with no significant atherosclerotic plaque in the renal or mesenteric arteries. There was scanty calcification in the distal LAD artery distribution.  There is no evidence of renal artery stenosis on a duplex ultrasound performed in 2015.  He has been labeled as having chronic kidney disease, but for the last 2 years his creatinine has been in normal range and the most recent value was 1.01 corresponding to a GFR of 78.  He reports left-sided chest pain, just underneath his breast, which is intensified by deep breath and makes him wince a little when he tries to breathe deeply.  He does not have fever or chills and has not had cough or hemoptysis.  No sick contacts.  The patient specifically denies any chest pain with exertion, dyspnea at rest or with exertion, orthopnea, paroxysmal nocturnal dyspnea, syncope, palpitations, focal neurological deficits, intermittent claudication, lower extremity edema, unexplained weight gain.   Past Medical History:  Diagnosis Date   Anemia    anemia thrombocytopenia, saw Hematology 2011, can not r/o myeloproliferative d/c   Antithrombin III  deficiency 05/16/2013   On coumadin  for this   Blood  clot in vein    in right leg   Chronic kidney disease 05/27/2013   ACUTE RENAL FAILURE    Crohn's disease (HCC)    tx. Imuran    GI bleed 6/11   w/ normal EGD 6/11, 3 PRBCs    Hearing loss    wears hearing aid left ear; birthed with hearing loss   HOH (hard of hearing) 05/16/2013   Hypercalcemia     Hyperkalemia 05/27/2013   Hypertension    Ileostomy in place [facility]) 04-11-13   04-18-13 ileostomy to be taken down.   Perianal abscess 2001   s/p right hemicolectomy, and drainage of retroperitoneal abcess 2003   Peripheral vascular disease    Transfusion history    last 8'13    Past Surgical History:  Procedure Laterality Date   Anal fistulotomy  2002   EMBOLECTOMY  03/12/2012   Procedure: EMBOLECTOMY;  Surgeon: Lonni GORMAN Blade, MD;  Location[facility] OR;  Service: Vascular;  Laterality: Right;  Right popliteal embolectomy with vein patch angioplasty, right posterior tibial embolectomy with vein patch angioplasty    ESOPHAGOGASTRODUODENOSCOPY N/A 12/12/2023   Procedure: EGD (ESOPHAGOGASTRODUODENOSCOPY);  Surgeon: Burnette Fallow, MD;  Location: [facility];  Service: Gastroenterology;  Laterality: N/A;   HEMICOLECTOMY  08/29/2001   perforated abscess   ILEOSTOMY CLOSURE N/A 04/18/2013   Procedure: ILEOSTOMY TAKEDOWN;  Surgeon: Morene ONEIDA Olives, MD;  Location: [facility];  Service: General;  Laterality: N/A;   INTRAOPERATIVE ARTERIOGRAM  03/12/2012   Procedure: INTRA OPERATIVE ARTERIOGRAM;  Surgeon: Lonni GORMAN Blade, MD;  Location: [facility] OR;  Service: Vascular;  Laterality: Right;   PARTIAL COLECTOMY  03/11/2012   Procedure: PARTIAL COLECTOMY;  Surgeon: Morene ONEIDA Olives, MD;  Location: [facility];  Service: General;  Laterality: N/A;  subtotal colectomy transverse and left colon    TRANSMETATARSAL AMPUTATION  05/10/2012   right   UPPER ESOPHAGEAL ENDOSCOPIC ULTRASOUND (EUS) N/A 12/12/2023   Procedure: UPPER ESOPHAGEAL ENDOSCOPIC ULTRASOUND (EUS);  Surgeon: Burnette Fallow, MD;  Location: [facility];  Service: Gastroenterology;  Laterality: N/A;    Current Medications: Current Meds  Medication Sig   allopurinol  (ZYLOPRIM ) 300 MG tablet TAKE 1 TABLET BY MOUTH EVERY DAY   amLODipine  (NORVASC ) 10 MG tablet TAKE 1 TABLET BY MOUTH EVERYDAY AT BEDTIME   atorvastatin  (LIPITOR) 40 MG tablet TAKE  1 TABLET BY MOUTH EVERY DAY   carvedilol  (COREG ) 25 MG tablet TAKE 1 TABLET (25 MG TOTAL) BY MOUTH TWICE A DAY WITH MEALS   doxazosin  (CARDURA ) 4 MG tablet Take 1 tablet (4 mg total) by mouth at bedtime.   spironolactone  (ALDACTONE ) 25 MG tablet Take 1 tablet (25 mg total) by mouth daily.   triamcinolone  cream (KENALOG ) 0.5 % Apply 1 Application topically 3 (three) times daily.   VASCEPA  1 g capsule TAKE 2 CAPSULES BY MOUTH 2 TIMES DAILY.   warfarin (COUMADIN ) 5 MG tablet TAKE 1 1/2 TABLETS BY MOUTH DAILY EXCEPT TAKE 1 TABLET ON TUESDAY, THURSDAY AND SATURDAY OR AS DIRECTED BY CLINIC.     Allergies:   Olmesartan  and Mesalamine   Social History   Socioeconomic History   Marital status: Single    Spouse name: Not on file   Number of children: Not on file   Years of education: Not on file   Highest education level: Not on file  Occupational History   Occupation: Post office    Occupation: disablity  Tobacco Use   Smoking status: Former    Current packs/day: 0.00    Average  packs/day: 0.3 packs/day for 9.0 years (2.3 ttl pk-yrs)    Types: Cigarettes    Start date: 05/08/2002    Quit date: 05/09/2011    Years since quitting: 13.1   Smokeless tobacco: Never  Vaping Use   Vaping status: Never Used  Substance and Sexual Activity   Alcohol use: No    Alcohol/week: 0.0 standard drinks of alcohol   Drug use: Yes    Types: Marijuana   Sexual activity: Not Currently  Other Topics Concern   Not on file  Social History Narrative   Lives by himself/ Regular exercise- no    Social Drivers of Corporate Investment Banker Strain: Low Risk  (06/11/2023)   Overall Financial Resource Strain (CARDIA)    Difficulty of Paying Living Expenses: Not hard at all  Food Insecurity: No Food Insecurity (06/12/2024)   Hunger Vital Sign    Worried About Running Out of Food in the Last Year: Never true    Ran Out of Food in the Last Year: Never true  Transportation Needs: No Transportation Needs  (06/12/2024)   PRAPARE - Administrator, Civil Service (Medical): No    Lack of Transportation (Non-Medical): No  Physical Activity: Sufficiently Active (06/12/2024)   Exercise Vital Sign    Days of Exercise per Week: 7 days    Minutes of Exercise per Session: 30 min  Stress: No Stress Concern Present (06/12/2024)   [employer] of Occupational Health - Occupational Stress Questionnaire    Feeling of Stress: Not at all  Social Connections: Moderately Integrated (06/12/2024)   Social Connection and Isolation Panel    Frequency of Communication with Friends and Family: More than three times a week    Frequency of Social Gatherings with Friends and Family: More than three times a week    Attends Religious Services: More than 4 times per year    Active Member of [employer] or Organizations: Yes    Attends Engineer, Structural: More than 4 times per year    Marital Status: Never married     Family History: The patient's family history includes Heart disease in his father; Hyperlipidemia in his father and mother; Hypertension in his father, mother, and another family member; Stroke in an other family member. There is no history of Coronary artery disease, Diabetes, Colon cancer, Prostate cancer, or Kidney failure.  ROS:   Please see the history of present illness.     All other systems reviewed and are negative.  EKGs/Labs/Other Studies Reviewed:    The following studies were reviewed today: Multiple ECGs, going back to 2013 Echocardiogram 2013, duplex ultrasonography of the renal arteries 2015, lower extremity ABIs 2018 CT abdomen and pelvis performed December 2024 shows aortic and iliac artery atherosclerosis, without obvious obstruction.  He has a moderate-sized left ventral hernia with small bowel within the hernia sac.      Recent Labs: 10/24/2023: BUN 13; Creatinine, Ser 1.01; Potassium 3.4; Sodium 137 05/13/2024: ALT 22; TSH 1.26 07/07/2024: Hemoglobin 14.4;  Platelets 399  Recent Lipid Panel    Component Value Date/Time   CHOL 138 05/13/2024 1009   TRIG 78.0 05/13/2024 1009   TRIG 147 02/12/2006 1539   HDL 43.90 05/13/2024 1009   CHOLHDL 3 05/13/2024 1009   VLDL 15.6 05/13/2024 1009   LDLCALC 79 05/13/2024 1009   LDLDIRECT 97.0 05/05/2019 1648     Risk Assessment/Calculations:            Physical Exam:  VS:  BP 112/66 (BP Location: Left Arm, Patient Position: Sitting)   Pulse 70   Ht 5' 9 (1.753 m)   Wt 203 lb 6.4 oz (92.3 kg)   SpO2 93%   BMI 30.04 kg/m     Wt Readings from Last 3 Encounters:  07/07/24 203 lb 6.4 oz (92.3 kg)  06/12/24 208 lb (94.3 kg)  05/22/24 202 lb (91.6 kg)     GEN:  Well nourished, well developed in no acute distress HEENT: Normal NECK: No JVD; No carotid bruits LYMPHATICS: No lymphadenopathy CARDIAC: RRR, no murmurs, rubs, gallops RESPIRATORY: Diminished breath sounds in the right base, but otherwise clear to auscultation without rales, wheezing or rhonchi  ABDOMEN: Extensive abdominal scarring and a ventral hernia that is easily reducible, soft, non-tender, non-distended MUSCULOSKELETAL:  No edema; No deformity  SKIN: Warm and dry NEUROLOGIC:  Alert and oriented x 3 PSYCHIATRIC:  Normal affect   ASSESSMENT:    1. PAD (peripheral artery disease)   2. Pleurisy   3. Decreased pulses in feet   4. Nonspecific abnormal electrocardiogram (ECG) (EKG)   5. Mixed hyperlipidemia   6. Essential hypertension   7. 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 Antithrombin III  deficiency   9. Long term (current) use of anticoagulants   10. Crohn's disease of large intestine with abscess (HCC)   11. Ventral hernia without obstruction or gangrene    PLAN:    In order of problems listed above:  Abnormal ECG: Mild sinus bradycardia has been seen, but today his heart rate is 70 bpm.  The left anterior fascicular block is chronic present for at least the last 10 years.  No  additional investigation is necessary at this time.  He does not have symptoms to suggest high-grade AV block or need for pacemaker. PAD: Thready pulses in both feet, update ABIs.  Is not appear to have claudication.  The transmetatarsal amputation of the right foot appears to be well-healed. Pleuritic right chest pain: Clearly not anginal.  No symptoms of pneumonia.  Reported history of DVT but I think this was an incorrect assumption when he had an arterial embolism to his leg.  Nevertheless we will screen for possible pulmonary infarction with D-dimer and also check inflammatory markers, CBC and a chest x-ray. HLP/preDM: Triglycerides are in normal range.  Target LDL cholesterol less than 70.  Recent labs showed to be 79, not far off from target.  Discussed healthy diet and exercise routine.  Continue current dose of atorvastatin  for now, consider increasing the dose to 80 mg daily or adding ezetimibe 10 mg daily if next year's labs show a similar value.  Continue Vascepa .  Hemoglobin A1c in prediabetes range at 6.2%.  Discussed healthy dietary changes with reduction intake of sugars and carbohydrates with high glycemic index, increased intake of complex carbohydrates from whole grains, unsaturated fat and lean protein. HTN: Well-controlled blood pressure on amlodipine  and carvedilol  at maximum doses as well as spironolactone . History of systemic arterial embolism: On chronic warfarin anticoagulation.  He has been therapeutically anticoagulated for at least the last 3 months which makes pulmonary embolism unlikely to be a cause of his pleuritic chest pain.  Not sure how extensive his hypercoagulable workup was.  In 2013 his Antithrombin 3 level was low, but he might have been on heparin  at the time.  Antithrombin III  deficiency is usually associated with venous thrombotic events, not arterial ones.  Other hypercoagulable studies are not available for  review.  He has been on warfarin ever since, which is  monitored by his primary care provider. Crohn's disease: Multiple abdominal surgeries, history of retroperitoneal abscess end ileostomy, both resolved.  Still has a ventral hernia.  Currently no symptoms of active inflammatory bowel syndrome and not on any active anti-inflammatory therapy..           Medication Adjustments/Labs and Tests Ordered: Current medicines are reviewed at length with the patient today.  Concerns regarding medicines are outlined above.  Orders Placed This Encounter  Procedures   DG Chest 2 View   CBC   Sedimentation rate   C-reactive protein   D-Dimer, Quantitative   CBC   ECHOCARDIOGRAM COMPLETE   VAS US  ABI WITH/WO TBI   No orders of the defined types were placed in this encounter.   Patient Instructions  Medication Instructions:  No changes *If you need a refill on your cardiac medications before your next appointment, please call your pharmacy*  Lab Work: CBC, D-Dimer, ESR, CRP If you have labs (blood work) drawn today and your tests are completely normal, you will receive your results only by: MyChart Message (if you have MyChart) OR A paper copy in the mail If you have any lab test that is abnormal or we need to change your treatment, we will call you to review the results.  Testing/Procedures: chest X-ray  needed today  Your physician has requested that you have an echocardiogram. Echocardiography is a painless test that uses sound waves to create images of your heart. It provides your doctor with information about the size and shape of your heart and how well your heart's chambers and valves are working. This procedure takes approximately one hour. There are no restrictions for this procedure. Please do NOT wear cologne, perfume, aftershave, or lotions (deodorant is allowed). Please arrive 15 minutes prior to your appointment time.  Please note: We ask at that you not bring children with you during ultrasound (echo/ vascular) testing. Due to  room size and safety concerns, children are not allowed in the ultrasound rooms during exams. Our front office staff cannot provide observation of children in our lobby area while testing is being conducted. An adult accompanying a patient to their appointment will only be allowed in the ultrasound room at the discretion of the ultrasound technician under special circumstances. We apologize for any inconvenience.   Your physician has requested that you have an ankle brachial index (ABI). During this test an ultrasound and blood pressure cuff are used to evaluate the arteries that supply the arms and legs with blood. Allow thirty minutes for this exam. There are no restrictions or special instructions.  Please note: We ask at that you not bring children with you during ultrasound (echo/ vascular) testing. Due to room size and safety concerns, children are not allowed in the ultrasound rooms during exams. Our front office staff cannot provide observation of children in our lobby area while testing is being conducted. An adult accompanying a patient to their appointment will only be allowed in the ultrasound room at the discretion of the ultrasound technician under special circumstances. We apologize for any inconvenience.   Follow-Up: At [facility], you and your health needs are our priority.  As part of our continuing mission to provide you with exceptional heart care, our providers are all part of one team.  This team includes your primary Cardiologist (physician) and Advanced Practice Providers or APPs (Physician Assistants and Nurse Practitioners) who all work  together to provide you with the care you need, when you need it.  Your next appointment:   APP- 4-6 weeks  Dr Francyne- as needed We recommend signing up for the patient portal called MyChart.  Sign up information is provided on this After Visit Summary.  MyChart is used to connect with patients for Virtual Visits (Telemedicine).   Patients are able to view lab/test results, encounter notes, upcoming appointments, etc.  Non-urgent messages can be sent to your provider as well.   To learn more about what you can do with MyChart, go to [email or web address].      Signed, Jerel Francyne, MD  07/09/2024 12:21 PM    [facility]

## 2024-07-07 NOTE — Patient Instructions (Signed)
 Medication Instructions:  No changes *If you need a refill on your cardiac medications before your next appointment, please call your pharmacy*  Lab Work: CBC, D-Dimer, ESR, CRP If you have labs (blood work) drawn today and your tests are completely normal, you will receive your results only by: MyChart Message (if you have MyChart) OR A paper copy in the mail If you have any lab test that is abnormal or we need to change your treatment, we will call you to review the results.  Testing/Procedures: chest X-ray  needed today  Your physician has requested that you have an echocardiogram. Echocardiography is a painless test that uses sound waves to create images of your heart. It provides your doctor with information about the size and shape of your heart and how well your heart's chambers and valves are working. This procedure takes approximately one hour. There are no restrictions for this procedure. Please do NOT wear cologne, perfume, aftershave, or lotions (deodorant is allowed). Please arrive 15 minutes prior to your appointment time.  Please note: We ask at that you not bring children with you during ultrasound (echo/ vascular) testing. Due to room size and safety concerns, children are not allowed in the ultrasound rooms during exams. Our front office staff cannot provide observation of children in our lobby area while testing is being conducted. An adult accompanying a patient to their appointment will only be allowed in the ultrasound room at the discretion of the ultrasound technician under special circumstances. We apologize for any inconvenience.   Your physician has requested that you have an ankle brachial index (ABI). During this test an ultrasound and blood pressure cuff are used to evaluate the arteries that supply the arms and legs with blood. Allow thirty minutes for this exam. There are no restrictions or special instructions.  Please note: We ask at that you not bring children  with you during ultrasound (echo/ vascular) testing. Due to room size and safety concerns, children are not allowed in the ultrasound rooms during exams. Our front office staff cannot provide observation of children in our lobby area while testing is being conducted. An adult accompanying a patient to their appointment will only be allowed in the ultrasound room at the discretion of the ultrasound technician under special circumstances. We apologize for any inconvenience.   Follow-Up: At [facility], you and your health needs are our priority.  As part of our continuing mission to provide you with exceptional heart care, our providers are all part of one team.  This team includes your primary Cardiologist (physician) and Advanced Practice Providers or APPs (Physician Assistants and Nurse Practitioners) who all work together to provide you with the care you need, when you need it.  Your next appointment:   APP- 4-6 weeks  Dr Francyne- as needed We recommend signing up for the patient portal called MyChart.  Sign up information is provided on this After Visit Summary.  MyChart is used to connect with patients for Virtual Visits (Telemedicine).  Patients are able to view lab/test results, encounter notes, upcoming appointments, etc.  Non-urgent messages can be sent to your provider as well.   To learn more about what you can do with MyChart, go to [email or web address].

## 2024-07-08 ENCOUNTER — Ambulatory Visit: Payer: Self-pay | Admitting: Cardiovascular Disease

## 2024-07-08 LAB — CBC
Hematocrit: 44.5 % (ref 37.5–51.0)
Hemoglobin: 14.4 g/dL (ref 13.0–17.7)
MCH: 30.5 pg (ref 26.6–33.0)
MCHC: 32.4 g/dL (ref 31.5–35.7)
MCV: 94 fL (ref 79–97)
Platelets: 399 x10E3/uL (ref 150–450)
RBC: 4.72 x10E6/uL (ref 4.14–5.80)
RDW: 14 % (ref 11.6–15.4)
WBC: 6.1 x10E3/uL (ref 3.4–10.8)

## 2024-07-08 LAB — C-REACTIVE PROTEIN: CRP: <1 mg/L (ref 0–10)

## 2024-07-08 LAB — SEDIMENTATION RATE: Sed Rate: 18 mm/h (ref 0–30)

## 2024-07-09 ENCOUNTER — Other Ambulatory Visit: Payer: Self-pay | Admitting: Internal Medicine

## 2024-07-10 ENCOUNTER — Other Ambulatory Visit: Payer: Self-pay | Admitting: Internal Medicine

## 2024-07-10 ENCOUNTER — Encounter: Payer: Self-pay | Admitting: Internal Medicine

## 2024-07-10 DIAGNOSIS — R079 Chest pain, unspecified: Secondary | ICD-10-CM

## 2024-07-10 DIAGNOSIS — R0789 Other chest pain: Secondary | ICD-10-CM

## 2024-07-10 DIAGNOSIS — E785 Hyperlipidemia, unspecified: Secondary | ICD-10-CM

## 2024-07-10 DIAGNOSIS — J90 Pleural effusion, not elsewhere classified: Secondary | ICD-10-CM | POA: Insufficient documentation

## 2024-07-10 NOTE — Telephone Encounter (Signed)
 Went over the results with the patient. He is already aware of the information and the Chest CT is scheduled for 07/15/24.

## 2024-07-14 ENCOUNTER — Telehealth: Payer: Self-pay | Admitting: Cardiovascular Disease

## 2024-07-14 NOTE — Telephone Encounter (Signed)
 Pt c/o Shortness Of Breath: STAT if SOB developed within the last 24 hours or pt is noticeably SOB on the phone  1. Are you currently SOB (can you hear that pt is SOB on the phone)? yes  2. How long have you been experiencing SOB? 3 weeks ago, it has gotten around   3. Are you SOB when sitting or when up moving around? Moving around  4. Are you currently experiencing any other symptoms? Sharp pain on the right sie of his

## 2024-07-14 NOTE — Telephone Encounter (Signed)
 Spoke with pt and sister, the patient is complaining of chest pain on the right side. This is the same pain he had when he was seen on 07/07/24. He has no edema and a cough of clear sputum. He has a CT scan tomorrow to follow up on this right sided pain. He was given the okay to take tylenol  as needed to see if it will help with the discomfort. Reassurance given, does not sound related to heart and all testing was normal.

## 2024-07-15 ENCOUNTER — Ambulatory Visit: Payer: Self-pay | Admitting: Internal Medicine

## 2024-07-15 ENCOUNTER — Other Ambulatory Visit: Payer: Self-pay | Admitting: Internal Medicine

## 2024-07-15 ENCOUNTER — Inpatient Hospital Stay: Admission: RE | Admit: 2024-07-15 | Discharge: 2024-07-15 | Attending: Internal Medicine | Admitting: Internal Medicine

## 2024-07-15 DIAGNOSIS — J90 Pleural effusion, not elsewhere classified: Secondary | ICD-10-CM

## 2024-07-15 DIAGNOSIS — R079 Chest pain, unspecified: Secondary | ICD-10-CM

## 2024-07-15 MED ORDER — IOPAMIDOL (ISOVUE-300) INJECTION 61%
75.0000 mL | Freq: Once | INTRAVENOUS | Status: AC | PRN
  Administered 2024-07-15: 75 mL via INTRAVENOUS

## 2024-07-15 NOTE — Progress Notes (Signed)
 CT is abnormal I have ordered a pulm referral

## 2024-07-17 ENCOUNTER — Ambulatory Visit: Admitting: Urology

## 2024-07-17 NOTE — Progress Notes (Signed)
 If he has trouble breathing ask him to go to the ED

## 2024-07-17 NOTE — Progress Notes (Deleted)
 Assessment: 1. Elevated PSA - negative biopsy 11/24   2. BPH without obstruction/lower urinary tract symptoms   3. Right renal mass - Bosniak 3 cyst     Plan: Return to office in 6 months Will arrange for repeat imagine of right renal mass at next visit  Chief Complaint:  No chief complaint on file.   History of Present Illness:  Rick Edwards is a 68 y.o. male who is seen for further evaluation of elevated PSA and right renal mass. PSA results: 9/20 5.54 4/21 1.5 7/22 2.08 6/23 2.99 5/24 4.79  No history of UTI's or prostatitis. No family history of prostate cancer. ExoDx from 01/10/2023 was 54.94 indicating increased risk of high-grade prostate cancer. Prostate MRI from 04/23/2023 showed a PI-RADS 3 lesion in the left peripheral zone and prostate volume of 52.63 cm. He underwent a fusion guided biopsy by Dr. Alvaro on 07/02/2023. Biopsy showed benign prostate tissue in all samples.  PSA 6/25:  3.9 PSA 10/25: 2.94  He has mild LUTS with occasional frequency, sensation of incomplete emptying, and nocturia x 2.  No dysuria or gross hematuria. IPSS = 9. He is on chronic anticoagulation with warfarin due to antithrombin III  deficiency and history of DVT.  He was found to have bilateral minimally complex renal cyst on CT and MRI from January 2025.  He was seen in consultation with Dr. Alvaro.  Continued surveillance recommended with repeat renal MRI in January 2026.  At his visit in June 2025, he was doing well from a urinary standpoint.  No new lower urinary tract symptoms.  No dysuria or gross hematuria.  He reported some urgency and decreased stream. IPSS = 10/2.  Portions of the above documentation were copied from a prior visit for review purposes only.   Past Medical History:  Past Medical History:  Diagnosis Date   Anemia    anemia thrombocytopenia, saw Hematology 2011, can not r/o myeloproliferative d/c   Antithrombin III  deficiency 05/16/2013   On coumadin   for this   Blood clot in vein    in right leg   Chronic kidney disease 05/27/2013   ACUTE RENAL FAILURE    Crohn's disease (HCC)    tx. Imuran    GI bleed 6/11   w/ normal EGD 6/11, 3 PRBCs    Hearing loss    wears hearing aid left ear; birthed with hearing loss   HOH (hard of hearing) 05/16/2013   Hypercalcemia    Hyperkalemia 05/27/2013   Hypertension    Ileostomy in place [facility]) 04-11-13   04-18-13 ileostomy to be taken down.   Perianal abscess 2001   s/p right hemicolectomy, and drainage of retroperitoneal abcess 2003   Peripheral vascular disease    Transfusion history    last 8'13    Past Surgical History:  Past Surgical History:  Procedure Laterality Date   Anal fistulotomy  2002   EMBOLECTOMY  03/12/2012   Procedure: EMBOLECTOMY;  Surgeon: Lonni GORMAN Blade, MD;  Location: [facility] OR;  Service: Vascular;  Laterality: Right;  Right popliteal embolectomy with vein patch angioplasty, right posterior tibial embolectomy with vein patch angioplasty    ESOPHAGOGASTRODUODENOSCOPY N/A 12/12/2023   Procedure: EGD (ESOPHAGOGASTRODUODENOSCOPY);  Surgeon: Burnette Fallow, MD;  Location: [facility];  Service: Gastroenterology;  Laterality: N/A;   HEMICOLECTOMY  08/29/2001   perforated abscess   ILEOSTOMY CLOSURE N/A 04/18/2013   Procedure: ILEOSTOMY TAKEDOWN;  Surgeon: Morene ONEIDA Olives, MD;  Location: [facility];  Service: General;  Laterality: N/A;  INTRAOPERATIVE ARTERIOGRAM  03/12/2012   Procedure: INTRA OPERATIVE ARTERIOGRAM;  Surgeon: Lonni GORMAN Blade, MD;  Location: [facility] OR;  Service: Vascular;  Laterality: Right;   PARTIAL COLECTOMY  03/11/2012   Procedure: PARTIAL COLECTOMY;  Surgeon: Morene ONEIDA Olives, MD;  Location: [facility];  Service: General;  Laterality: N/A;  subtotal colectomy transverse and left colon    TRANSMETATARSAL AMPUTATION  05/10/2012   right   UPPER ESOPHAGEAL ENDOSCOPIC ULTRASOUND (EUS) N/A 12/12/2023   Procedure: UPPER ESOPHAGEAL ENDOSCOPIC ULTRASOUND (EUS);   Surgeon: Burnette Fallow, MD;  Location: [facility];  Service: Gastroenterology;  Laterality: N/A;    Allergies:  Allergies  Allergen Reactions   Olmesartan      Hx of severe AKI requiring HD on ACE per renal note, hx of AKI and hypotension with olmesartan  07/2023   Mesalamine Rash    REACTION: Rash    Family History:  Family History  Problem Relation Age of Onset   Hypertension Mother    Hyperlipidemia Mother    Hypertension Father    Hyperlipidemia Father    Heart disease Father    Stroke Other        GF, aunts    Hypertension Other        the whole family   Coronary artery disease Neg Hx    Diabetes Neg Hx    Colon cancer Neg Hx    Prostate cancer Neg Hx    Kidney failure Neg Hx     Social History:  Social History   Tobacco Use   Smoking status: Former    Current packs/day: 0.00    Average packs/day: 0.3 packs/day for 9.0 years (2.3 ttl pk-yrs)    Types: Cigarettes    Start date: 05/08/2002    Quit date: 05/09/2011    Years since quitting: 13.2   Smokeless tobacco: Never  Vaping Use   Vaping status: Never Used  Substance Use Topics   Alcohol use: No    Alcohol/week: 0.0 standard drinks of alcohol   Drug use: Yes    Types: Marijuana    ROS: Constitutional:  Negative for fever, chills, weight loss CV: Negative for chest pain, previous MI, hypertension Respiratory:  Negative for shortness of breath, wheezing, sleep apnea, frequent cough GI:  Negative for nausea, vomiting, bloody stool, GERD  Physical exam: There were no vitals taken for this visit. GENERAL APPEARANCE:  Well appearing, well developed, well nourished, NAD HEENT:  Atraumatic, normocephalic, oropharynx clear NECK:  Supple without lymphadenopathy or thyromegaly ABDOMEN:  Soft, non-tender, no masses EXTREMITIES:  Moves all extremities well, without clubbing, cyanosis, or edema NEUROLOGIC:  Alert and oriented x 3, normal gait, CN II-XII grossly intact MENTAL STATUS:  appropriate BACK:   Non-tender to palpation, No CVAT SKIN:  Warm, dry, and intact    Results: U/A:

## 2024-07-18 ENCOUNTER — Encounter (HOSPITAL_COMMUNITY): Payer: Self-pay

## 2024-07-18 ENCOUNTER — Other Ambulatory Visit: Payer: Self-pay

## 2024-07-18 ENCOUNTER — Ambulatory Visit (HOSPITAL_COMMUNITY)
Admission: RE | Admit: 2024-07-18 | Discharge: 2024-07-18 | Attending: Cardiovascular Disease | Admitting: Cardiovascular Disease

## 2024-07-18 ENCOUNTER — Emergency Department (HOSPITAL_COMMUNITY)

## 2024-07-18 ENCOUNTER — Emergency Department (HOSPITAL_COMMUNITY)
Admission: EM | Admit: 2024-07-18 | Discharge: 2024-07-19 | Disposition: A | Attending: Emergency Medicine | Admitting: Emergency Medicine

## 2024-07-18 DIAGNOSIS — R0989 Other specified symptoms and signs involving the circulatory and respiratory systems: Secondary | ICD-10-CM | POA: Diagnosis not present

## 2024-07-18 DIAGNOSIS — I129 Hypertensive chronic kidney disease with stage 1 through stage 4 chronic kidney disease, or unspecified chronic kidney disease: Secondary | ICD-10-CM | POA: Insufficient documentation

## 2024-07-18 DIAGNOSIS — Z79899 Other long term (current) drug therapy: Secondary | ICD-10-CM | POA: Diagnosis not present

## 2024-07-18 DIAGNOSIS — R0600 Dyspnea, unspecified: Secondary | ICD-10-CM | POA: Diagnosis not present

## 2024-07-18 DIAGNOSIS — R911 Solitary pulmonary nodule: Secondary | ICD-10-CM | POA: Diagnosis not present

## 2024-07-18 DIAGNOSIS — R0602 Shortness of breath: Secondary | ICD-10-CM | POA: Diagnosis not present

## 2024-07-18 DIAGNOSIS — J90 Pleural effusion, not elsewhere classified: Secondary | ICD-10-CM | POA: Diagnosis not present

## 2024-07-18 DIAGNOSIS — N183 Chronic kidney disease, stage 3 unspecified: Secondary | ICD-10-CM | POA: Diagnosis not present

## 2024-07-18 DIAGNOSIS — E1122 Type 2 diabetes mellitus with diabetic chronic kidney disease: Secondary | ICD-10-CM | POA: Insufficient documentation

## 2024-07-18 DIAGNOSIS — Z7901 Long term (current) use of anticoagulants: Secondary | ICD-10-CM | POA: Insufficient documentation

## 2024-07-18 DIAGNOSIS — I739 Peripheral vascular disease, unspecified: Secondary | ICD-10-CM

## 2024-07-18 DIAGNOSIS — R0789 Other chest pain: Secondary | ICD-10-CM | POA: Insufficient documentation

## 2024-07-18 DIAGNOSIS — R918 Other nonspecific abnormal finding of lung field: Secondary | ICD-10-CM | POA: Diagnosis not present

## 2024-07-18 LAB — CBC
HCT: 41.8 % (ref 39.0–52.0)
Hemoglobin: 14.2 g/dL (ref 13.0–17.0)
MCH: 31.1 pg (ref 26.0–34.0)
MCHC: 34 g/dL (ref 30.0–36.0)
MCV: 91.7 fL (ref 80.0–100.0)
Platelets: 355 K/uL (ref 150–400)
RBC: 4.56 MIL/uL (ref 4.22–5.81)
RDW: 13.8 % (ref 11.5–15.5)
WBC: 6.8 K/uL (ref 4.0–10.5)
nRBC: 0 % (ref 0.0–0.2)

## 2024-07-18 LAB — BASIC METABOLIC PANEL WITH GFR
Anion gap: 7 (ref 5–15)
BUN: 13 mg/dL (ref 8–23)
CO2: 28 mmol/L (ref 22–32)
Calcium: 8.7 mg/dL — ABNORMAL LOW (ref 8.9–10.3)
Chloride: 103 mmol/L (ref 98–111)
Creatinine, Ser: 1 mg/dL (ref 0.61–1.24)
GFR, Estimated: >60 mL/min (ref >60)
Glucose, Bld: 99 mg/dL (ref 70–99)
Potassium: 3.6 mmol/L (ref 3.5–5.1)
Sodium: 138 mmol/L (ref 135–145)

## 2024-07-18 LAB — VAS US ABI WITH/WO TBI
Left ABI: 1.06
Right ABI: 1.1

## 2024-07-18 NOTE — ED Triage Notes (Signed)
 Pt c.o increased SOB for the past few weeks, pt recently had a CT of his chest that showed a moderate sized right pleural effusion. Pt denies any leg swelling, some mild right sided chest pain that is intermittent.

## 2024-07-18 NOTE — ED Provider Notes (Signed)
 [place] EMERGENCY DEPARTMENT AT [facility] Provider Note   CSN: [id number] Arrival date & time: 07/18/24  1645     Patient presents with: Shortness of Breath   Rick Edwards is a 68 y.o. male with past medical history of HTN, Antithrombin III  deficiency, CKD stage III, PVD, HLD, T2DM, BPH, DVT (on warfarin) presents to emergency department for evaluation of shortness of breath for the past 3 weeks.  Reports that he also has associated right lower rib pain that worsens with coughing.  Has had a productive mucus cough for past 3 weeks.  Shortness of breath worsens with exertion and laying flat.  Former smoker but does not currently smoke.  Was evaluated by his PCP who obtained lab work with negative dimer and CT chest.  CT chest noted moderate right-sided pleural effusion, 12 mm irregular nodule lesion in left upper lobe, small scattered pleural lesions in right upper lobe, pulmonary hypertension, and emphysema.  Denies congestion, fever, pedal edema     Shortness of Breath      Prior to Admission medications  Medication Sig Start Date End Date Taking? Authorizing Provider  amoxicillin -clavulanate (AUGMENTIN ) 875-125 MG tablet Take 1 tablet by mouth every 12 (twelve) hours. 07/19/24  Yes Minnie Tinnie BRAVO, PA  azithromycin  (ZITHROMAX ) 250 MG tablet Take 1 tablet (250 mg total) by mouth daily for 4 days. Take first 2 tablets together, then 1 every day until finished. 07/19/24 07/23/24 Yes Minnie Tinnie BRAVO, PA  allopurinol  (ZYLOPRIM ) 300 MG tablet TAKE 1 TABLET BY MOUTH EVERY DAY 02/06/24   Joshua Debby CROME, MD  amLODipine  (NORVASC ) 10 MG tablet TAKE 1 TABLET BY MOUTH EVERYDAY AT BEDTIME 02/06/24   Joshua Debby CROME, MD  atorvastatin  (LIPITOR) 40 MG tablet TAKE 1 TABLET BY MOUTH EVERY DAY 07/11/24   Joshua Debby CROME, MD  carvedilol  (COREG ) 25 MG tablet TAKE 1 TABLET (25 MG TOTAL) BY MOUTH TWICE A DAY WITH MEALS 01/22/24   Joshua Debby CROME, MD  doxazosin  (CARDURA ) 4 MG tablet Take 1 tablet  (4 mg total) by mouth at bedtime. 05/26/24   Joshua Debby CROME, MD  hyoscyamine (LEVSIN SL) 0.125 MG SL tablet Place under the tongue 2 (two) times daily as needed. Patient not taking: Reported on 07/07/2024 02/25/20   [provider]  spironolactone  (ALDACTONE ) 25 MG tablet Take 1 tablet (25 mg total) by mouth daily. 10/26/23   Joshua Debby CROME, MD  triamcinolone  cream (KENALOG ) 0.5 % Apply 1 Application topically 3 (three) times daily. 06/12/23   Joshua Debby CROME, MD  VASCEPA  1 g capsule TAKE 2 CAPSULES BY MOUTH 2 TIMES DAILY. 11/05/23   Joshua Debby CROME, MD  warfarin (COUMADIN ) 5 MG tablet TAKE 1 1/2 TABLETS BY MOUTH DAILY EXCEPT TAKE 1 TABLET ON TUESDAY, THURSDAY AND SATURDAY OR AS DIRECTED BY CLINIC. 01/22/24   Joshua Debby CROME, MD    Allergies: Olmesartan  and Mesalamine    Review of Systems  Respiratory:  Positive for shortness of breath.     Updated Vital Signs BP 124/78   Pulse 78   Temp 98.2 F (36.8 C) (Oral)   Resp (!) 22   Ht 5' 9 (1.753 m)   Wt 92.1 kg   SpO2 100%   BMI 29.98 kg/m   Physical Exam Vitals and nursing note reviewed.  Constitutional:      General: He is not in acute distress.    Appearance: Normal appearance.  HENT:     Head: Normocephalic and atraumatic.  Eyes:     Conjunctiva/sclera: Conjunctivae normal.  Cardiovascular:     Rate and Rhythm: Normal rate.     Pulses:          Dorsalis pedis pulses are 2+ on the right side and 2+ on the left side.     Comments: No calf tenderness nor swelling bilaterally Pulmonary:     Effort: Pulmonary effort is normal. No respiratory distress.     Breath sounds: Examination of the right-middle field reveals decreased breath sounds. Examination of the right-lower field reveals decreased breath sounds. Examination of the left-lower field reveals decreased breath sounds. Decreased breath sounds present. No wheezing.     Comments: Speaking in full complete sentences without difficulty.  No tachypnea.  Maintaining O2  saturation without supplementation. Chest:    Musculoskeletal:     Right lower leg: No edema.     Left lower leg: No edema.  Skin:    Coloration: Skin is not jaundiced or pale.  Neurological:     Mental Status: He is alert. Mental status is at baseline.     (all labs ordered are listed, but only abnormal results are displayed) Labs Reviewed  BASIC METABOLIC PANEL WITH GFR - Abnormal; Notable for the following components:      Result Value   Calcium  8.7 (*)    All other components within normal limits  CBC  BRAIN NATRIURETIC PEPTIDE  TROPONIN I (HIGH SENSITIVITY)  TROPONIN I (HIGH SENSITIVITY)    EKG: None  Radiology: DG Chest 2 View Result Date: 07/18/2024 EXAM: 2 VIEW(S) XRAY OF THE CHEST 07/18/2024 06:05:00 PM COMPARISON: 07/07/2024 CLINICAL HISTORY: SOB FINDINGS: LUNGS AND PLEURA: Stable Moderate right pleural effusion. Right lower lung airspace disease, possibly representing compressive atelectasis or pneumonia. No pneumothorax. HEART AND MEDIASTINUM: No acute abnormality of the cardiac and mediastinal silhouettes. BONES AND SOFT TISSUES: No acute osseous abnormality. IMPRESSION: 1. Right lower lung airspace disease, possibly representing pneumonia versus compressive atelectasis. 2. Stable moderate right pleural effusion. Electronically signed by: Oneil Devonshire MD 07/18/2024 07:00 PM EST RP Workstation: [id number]   VAS US  ABI WITH/WO TBI Result Date: 07/18/2024  LOWER EXTREMITY DOPPLER STUDY Patient Name:  Rick Edwards  Date of Exam:   07/18/2024 Medical Rec #: [id number]          Accession #:    [id number] Date of Birth: April 22, 1956          Patient Gender: M Patient Age:   46 years Exam Location:  [address] Procedure:      VAS US  ABI WITH/WO TBI Referring Phys: [facility] CROITORU --------------------------------------------------------------------------------  Indications: Peripheral artery disease. High Risk Factors: Hypertension, hyperlipidemia, Diabetes.  Vascular  Interventions: August 2013 urgent right femoral to tibioperoneal trunk                         embolectomy for ischemic foot with subsequent                         transmetatarsal amputation (unknown date). Performing Technologist: Geni Lodge RVS, RCS  Examination Guidelines: A complete evaluation includes at minimum, Doppler waveform signals and systolic blood pressure reading at the level of bilateral brachial, anterior tibial, and posterior tibial arteries, when vessel segments are accessible. Bilateral testing is considered an integral part of a complete examination. Photoelectric Plethysmograph (PPG) waveforms and toe systolic pressure readings are included as required and additional duplex testing as needed. Limited examinations for reoccurring indications  may be performed as noted.  ABI Findings: +---------+------------------+-----+---------+--------+ Right    Rt Pressure (mmHg)IndexWaveform Comment  +---------+------------------+-----+---------+--------+ Brachial 151                                      +---------+------------------+-----+---------+--------+ PTA      169               1.10 triphasic         +---------+------------------+-----+---------+--------+ DP       163               1.06 triphasic         +---------+------------------+-----+---------+--------+ Great Toe                                amp      +---------+------------------+-----+---------+--------+ +---------+------------------+-----+---------+-------+ Left     Lt Pressure (mmHg)IndexWaveform Comment +---------+------------------+-----+---------+-------+ Brachial 154                                     +---------+------------------+-----+---------+-------+ PTA      163               1.06 triphasic        +---------+------------------+-----+---------+-------+ DP       161               1.05 triphasic        +---------+------------------+-----+---------+-------+ Burnetta Oka                0.76                  +---------+------------------+-----+---------+-------+ +-------+-----------+-----------+------------+------------+ ABI/TBIToday's ABIToday's TBIPrevious ABIPrevious TBI +-------+-----------+-----------+------------+------------+ Right  1.10       amp        1.12        amp          +-------+-----------+-----------+------------+------------+ Left   1.06       0.76       1.07        0.93         +-------+-----------+-----------+------------+------------+  Bilateral ABIs appear essentially unchanged compared to prior study on 2018.  Summary: Right: Resting right ankle-brachial index is within normal range.  Left: Resting left ankle-brachial index is within normal range. Left toe pressure is >60 mmHg which suggests adequate perfusion for healing. *See table(s) above for measurements and observations.  Electronically signed by Dorn Lesches MD on 07/18/2024 at 3:19:54 PM.    Final     Medications Ordered in the ED  azithromycin  (ZITHROMAX ) tablet 500 mg (has no administration in time range)  amoxicillin -clavulanate (AUGMENTIN ) 875-125 MG per tablet 1 tablet (has no administration in time range)                                    Medical Decision Making Amount and/or Complexity of Data Reviewed Labs: ordered. Radiology: ordered.  Risk Prescription drug management.   Patient presents to the ED for concern of CP, SHOB, this involves an extensive number of treatment options, and is a complaint that carries with it a high risk of complications and morbidity.  The differential diagnosis includes viral illness, pneumonia, fluid overload, ACS, dissection, COPD exacerbation, neoplasm, PE  Co morbidities that complicate the patient evaluation  HTN, Antithrombin III  deficiency, CKD stage III, PVD, HLD, T2DM, BPH, DVT (on warfarin)   Additional history obtained:  Additional history obtained from Nursing and Outside Medical Records   External  records from outside source obtained and reviewed including triage RN note, labs obtained from 07/15/24, CT from 07/15/24   Lab Tests:  I Ordered, and personally interpreted labs.  The pertinent results include:   No leukocytosis    Imaging Studies ordered:  I ordered imaging studies including CXR  I independently visualized and interpreted imaging which showed   Right lower lung airspace disease, possibly representing pneumonia versus compressive atelectasis. Stable moderate right pleural effusion I agree with the radiologist interpretation   Cardiac Monitoring:  The patient was maintained on a cardiac monitor.  I personally viewed and interpreted the cardiac monitored which showed an underlying rhythm of: NSR at 74 bpm with no ischemic changes    Problem List / ED Course:  CP Shob  Chest pain is primarily localized to right lower thorax.  Pain worsens with palpation, coughing, bending twisting.  Is not exertional.  Does not sound ACS in nature.  Did obtain troponin however to ensure no elevation. First troponin negative. No ischemic changes on EKG Shortness of breath seems synonymous with fluid overload.  Positive orthopnea. Labs obtained by PCP on 07/07/2024 notable for negative dimer. ESR, CRP negative. These were obtained one week following onset of symptoms Reviewed CT imaging from 07/15/2024 which noted moderate size right pleural effusion and a 12 mm irregular nodular lesion in right upper lobe.  PCP provided pulmonology referral for nodule, effusion Patient does not appear grossly fluid overloaded on exam with no pedal edema at all.  He is maintaining his oxygen saturation at rest without supplementation.  Does not appear acutely dyspneic or in acute respiratory distress.  Lung sounds are notable for decreased lung sounds in left lower lobe, right middle and right lower lobe.  No wheezing As pt has been having productive cough for past three weeks, will treat for bacterial  pna/infection. Provided azithromycin  and augmentin  in ED as well as prescription. Discussed abx stewardship Ambulated wo hypoxia. No complaints of cp, shob while ambulating Pt to f/u with pulmonology   Reevaluation:  After the interventions noted above, I reevaluated the patient and found that they have :stayed the same     Dispostion:  Sign out to [facility] PA pending delta troponin. Pt likely to be dc w/ pulmonology f/u assuming delta troponin negative. Dispo is able to change based on discretion of oncoming provider. See their note   Final diagnoses:  Pleural effusion on right  Dyspnea, unspecified type  Atypical chest pain  Lung nodule    ED Discharge Orders          Ordered    azithromycin  (ZITHROMAX ) 250 MG tablet  Daily        07/19/24 0120    amoxicillin -clavulanate (AUGMENTIN ) 875-125 MG tablet  Every 12 hours        07/19/24 0120             Minnie Tinnie BRAVO, PA 07/19/24 0132    Ellouise Richerd POUR, DO 07/19/24 1458

## 2024-07-18 NOTE — ED Triage Notes (Signed)
 Pt reports SHOB for 3 weeks, worse today. Recent medication changes. Unable to name medication

## 2024-07-18 NOTE — ED Provider Triage Note (Signed)
 Emergency Medicine Provider Triage Evaluation Note  Rick Edwards , a 68 y.o. male  was evaluated in triage.  Pt complains of shortness of breath.  Patient has been having shortness of breath for the last 3 weeks which he states is worse today leading to him to seek evaluation here.  He also reports right lower anterior chest wall pain with cough.  He reports shortness of breath is worse with exertion and he is unable to lay on his side without discomfort.  Of note patient had a CT chest 3 days ago showing moderate-sized right pleural effusion with associated significant right lower lobe atelectasis.    Review of Systems  Positive: Dyspnea with exertion, right lower anterior chest wall pain, cough Negative: Dizziness, syncope, lower extremity edema  Physical Exam  BP (!) 156/87 (BP Location: Right Arm)   Pulse 77   Temp 98.3 F (36.8 C)   Resp 20   Ht 5' 9 (1.753 m)   Wt 92.1 kg   SpO2 98%   BMI 29.98 kg/m  Gen:   Awake, no distress   Resp:  Normal effort with decreased lung sounds of the right lung, decreased upper > lower lobe. MSK:   Moves extremities without difficulty  Other:  Anterior chest wall pain is reproducible with palpation of the area.  Medical Decision Making  Medically screening exam initiated at 5:34 PM.  Appropriate orders placed.  JAAMAL FAROOQUI was informed that the remainder of the evaluation will be completed by another provider, this initial triage assessment does not replace that evaluation, and the importance of remaining in the ED until their evaluation is complete.   Rosina Almarie LABOR, PA-C 07/18/24 1740

## 2024-07-19 ENCOUNTER — Other Ambulatory Visit: Payer: Self-pay | Admitting: Internal Medicine

## 2024-07-19 DIAGNOSIS — M1A39X0 Chronic gout due to renal impairment, multiple sites, without tophus (tophi): Secondary | ICD-10-CM

## 2024-07-19 DIAGNOSIS — I10 Essential (primary) hypertension: Secondary | ICD-10-CM

## 2024-07-19 DIAGNOSIS — R0600 Dyspnea, unspecified: Secondary | ICD-10-CM | POA: Diagnosis not present

## 2024-07-19 DIAGNOSIS — Z7901 Long term (current) use of anticoagulants: Secondary | ICD-10-CM

## 2024-07-19 DIAGNOSIS — I743 Embolism and thrombosis of arteries of the lower extremities: Secondary | ICD-10-CM

## 2024-07-19 LAB — TROPONIN I (HIGH SENSITIVITY)
Troponin I (High Sensitivity): 6 ng/L (ref <18)
Troponin I (High Sensitivity): 5 ng/L (ref <18)

## 2024-07-19 LAB — BRAIN NATRIURETIC PEPTIDE: B Natriuretic Peptide: 12.3 pg/mL (ref 0.0–100.0)

## 2024-07-19 MED ORDER — AZITHROMYCIN 250 MG PO TABS: 250.0000 mg | ORAL_TABLET | Freq: Every day | ORAL | 0 refills | Status: AC

## 2024-07-19 MED ORDER — AMOXICILLIN-POT CLAVULANATE 875-125 MG PO TABS
1.0000 | ORAL_TABLET | Freq: Once | ORAL | Status: AC
  Administered 2024-07-19: 1 via ORAL
  Filled 2024-07-19: qty 1

## 2024-07-19 MED ORDER — AMOXICILLIN-POT CLAVULANATE 875-125 MG PO TABS: 1.0000 | ORAL_TABLET | Freq: Two times a day (BID) | ORAL | 0 refills | Status: DC

## 2024-07-19 MED ORDER — AZITHROMYCIN 250 MG PO TABS
500.0000 mg | ORAL_TABLET | Freq: Once | ORAL | Status: AC
  Administered 2024-07-19: 500 mg via ORAL
  Filled 2024-07-19: qty 2

## 2024-07-19 NOTE — ED Notes (Signed)
 Pt maintained oxygen saturation while walking and changing positions, ER PA advised

## 2024-07-19 NOTE — Discharge Instructions (Addendum)
 You have been prescribed antibiotics to cover for possible pneumonia.  We recommend follow-up with pulmonary for further evaluation of your effusion.  This is suspected to be contributing to your symptoms.  You may continue to follow-up with your primary care doctor in the interim.  Return to the ED for new or concerning complaints.

## 2024-07-19 NOTE — ED Provider Notes (Signed)
 4:17 AM Delta troponin is negative.  Remainder of workup in the ED has been reassuring.  Patient does have moderate right pleural effusion, likely contributing to symptoms.  Have started him on antibiotics for coverage of possible pneumonia seen on x-ray.  The patient is afebrile without leukocytosis; no criteria for SIRS, doubt sepsis. He is ambulatory on room air without hypoxemia. Stable for ongoing f/u in the outpatient setting. Reiterated importance of this to patient and provided contact for [facility]. Return precautions discussed and provided. Patient discharged in stable condition with no unaddressed concerns.   Keith Sor, PA-C 07/19/24 [id number]    Haze Lonni PARAS, MD 07/20/24 [phone]

## 2024-07-21 ENCOUNTER — Encounter: Payer: Self-pay | Admitting: Urology

## 2024-07-21 ENCOUNTER — Other Ambulatory Visit: Payer: Self-pay | Admitting: Urology

## 2024-07-21 DIAGNOSIS — N281 Cyst of kidney, acquired: Secondary | ICD-10-CM

## 2024-07-22 ENCOUNTER — Ambulatory Visit

## 2024-07-22 DIAGNOSIS — Z7901 Long term (current) use of anticoagulants: Secondary | ICD-10-CM

## 2024-07-22 LAB — POCT INR: INR: 3.1 — AB (ref 2.0–3.0)

## 2024-07-22 NOTE — Patient Instructions (Addendum)
 Pre visit review using our clinic review tool, if applicable. No additional management support is needed unless otherwise documented below in the visit note.  Reduce dose today to take 1/2 tablet and then continue 1 1/2 tablets daily except take 1 tablet on Tuesday and Friday. Recheck in 2 weeks.

## 2024-07-22 NOTE — Progress Notes (Signed)
 Indication: antithrombin III  deficiency, DVT Pt was in ER on 12/12 diagnosed with pneumonia and pleural effusion. Given azithromycin  in the ER which interact with warfarin and prescribed Augmentin  for home x 1 wk, with no interaction. Pt reports doing ok but has a lot of fatigue. Advised pt if any symptoms worsen or he develops any new symptoms to contact the office or go back to the ER. Pt verbalized understanding. Reduce dose today to take 1/2 tablet and then continue 1 1/2 tablets daily except take 1 tablet on Tuesday and Friday. Recheck in 2 weeks.

## 2024-07-23 ENCOUNTER — Ambulatory Visit (HOSPITAL_COMMUNITY)

## 2024-07-23 ENCOUNTER — Ambulatory Visit: Payer: Self-pay

## 2024-07-23 ENCOUNTER — Ambulatory Visit (HOSPITAL_COMMUNITY)
Admission: EM | Admit: 2024-07-23 | Discharge: 2024-07-23 | Disposition: A | Discharge status: Home / Self Care | Source: Home / Self Care

## 2024-07-23 ENCOUNTER — Encounter (HOSPITAL_COMMUNITY): Payer: Self-pay

## 2024-07-23 DIAGNOSIS — J90 Pleural effusion, not elsewhere classified: Secondary | ICD-10-CM

## 2024-07-23 DIAGNOSIS — R053 Chronic cough: Secondary | ICD-10-CM

## 2024-07-23 DIAGNOSIS — R0600 Dyspnea, unspecified: Secondary | ICD-10-CM | POA: Diagnosis not present

## 2024-07-23 MED ORDER — PREDNISONE 20 MG PO TABS: 40.0000 mg | ORAL_TABLET | Freq: Every day | ORAL | 0 refills | Status: DC

## 2024-07-23 MED ORDER — PREDNISONE 20 MG PO TABS: 40.0000 mg | ORAL_TABLET | Freq: Every day | ORAL | 0 refills | Status: AC

## 2024-07-23 MED ORDER — ALBUTEROL SULFATE HFA 108 (90 BASE) MCG/ACT IN AERS: 1.0000 | INHALATION_SPRAY | Freq: Four times a day (QID) | RESPIRATORY_TRACT | 0 refills | Status: DC | PRN

## 2024-07-23 MED ORDER — ALBUTEROL SULFATE HFA 108 (90 BASE) MCG/ACT IN AERS: 1.0000 | INHALATION_SPRAY | Freq: Four times a day (QID) | RESPIRATORY_TRACT | 0 refills | Status: AC | PRN

## 2024-07-23 NOTE — Telephone Encounter (Signed)
 FYI Only or Action Required?: FYI only for provider: No appts available - UC advised.  Patient was last seen in primary care on 05/13/2024 by Joshua Debby CROME, MD.  Called Nurse Triage reporting Breathing Problem.  Symptoms began Ever since starting doxazosin .  Interventions attempted: Nothing.  Symptoms are: unchanged.  Triage Disposition: See HCP Within 4 Hours (Or PCP Triage)  Patient/caregiver understands and will follow disposition?: Yes  Still taking abx from ED - 5 or 10 left - taking as prewscribed Reason for Disposition  [1] MILD difficulty breathing (e.g., minimal/no SOB at rest, SOB with walking, pulse < 100) AND [2] NEW-onset or WORSE than normal  [1] Chest pain lasts > 5 minutes AND [2] occurred in past 3 days (72 hours) (Exception: Feels exactly the same as previously diagnosed heartburn and has accompanying sour taste in mouth.)  Answer Assessment - Initial Assessment Questions Sob with new medication doxazosin  - started medication a few months ago. Pt states he's feeling wheezy, jaw pain, short of breath all day.   1. RESPIRATORY STATUS: Describe your breathing? (e.g., wheezing, shortness of breath, unable to speak, severe coughing)      Short of breath 2. ONSET: When did this breathing problem begin?      Ever since starting doxazosin   3. PATTERN Does the difficult breathing come and go, or has it been constant since it started?      Short of breath all day 4. SEVERITY: How bad is your breathing? (e.g., mild, moderate, severe)      Not assessed  5. RECURRENT SYMPTOM: Have you had difficulty breathing before? If Yes, ask: When was the last time? and What happened that time?      Not assessed  6. CARDIAC HISTORY: Do you have any history of heart disease? (e.g., heart attack, angina, bypass surgery, angioplasty)      HTN, PVD 7. LUNG HISTORY: Do you have any history of lung disease?  (e.g., pulmonary embolus, asthma, emphysema)     Pleural  effusion on right side 8. CAUSE: What do you think is causing the breathing problem?      Pt thinks it's the doxazosin  medication 9. OTHER SYMPTOMS: Do you have any other symptoms? (e.g., chest pain, cough, dizziness, fever, runny nose)     Jaw pain, chest pain 10. O2 SATURATION MONITOR:  Do you use an oxygen saturation monitor (pulse oximeter) at home? If Yes, ask: What is your reading (oxygen level) today? What is your usual oxygen saturation reading? (e.g., 95%)       Not assessed  Protocols used: Breathing Difficulty-A-AH, Chest Pain-A-AH

## 2024-07-23 NOTE — ED Provider Notes (Signed)
 [facility]  Note:  This document was prepared using Conservation officer, historic buildings and may include unintentional dictation errors.  MRN: [id number] DOB: 1955/08/15  Subjective:   Rick Edwards is a 68 y.o. male presenting for ongoing shortness of breath x 3 weeks.  Patient reports that he was evaluated 5 days ago in the ER and was diagnosed with pleural effusion, right lower lobe pneumonia and started on antibiotic therapy.  Patient reports that he has been taking antibiotics as prescribed but does not feel any better.  Patient still having intermittent shortness of breath.  Patient is not using any other over-the-counter medication to treat symptoms.  Patient states shortness of breath is worse when laying down and when bending over.  No chest pain, weakness, dizziness, fever.  Current Medications[1]   Allergies[2]  Past Medical History:  Diagnosis Date   Anemia    anemia thrombocytopenia, saw Hematology 2011, can not r/o myeloproliferative d/c   Antithrombin III  deficiency 05/16/2013   On coumadin  for this   Blood clot in vein    in right leg   Chronic kidney disease 05/27/2013   ACUTE RENAL FAILURE    Crohn's disease (HCC)    tx. Imuran    GI bleed 6/11   w/ normal EGD 6/11, 3 PRBCs    Hearing loss    wears hearing aid left ear; birthed with hearing loss   HOH (hard of hearing) 05/16/2013   Hypercalcemia    Hyperkalemia 05/27/2013   Hypertension    Ileostomy in place [facility]) 04-11-13   04-18-13 ileostomy to be taken down.   Perianal abscess 2001   s/p right hemicolectomy, and drainage of retroperitoneal abcess 2003   Peripheral vascular disease    Transfusion history    last 8'13     Past Surgical History:  Procedure Laterality Date   Anal fistulotomy  2002   EMBOLECTOMY  03/12/2012   Procedure: EMBOLECTOMY;  Surgeon: Lonni GORMAN Blade, MD;  Location: [facility] OR;  Service: Vascular;  Laterality: Right;  Right popliteal embolectomy with vein patch  angioplasty, right posterior tibial embolectomy with vein patch angioplasty    ESOPHAGOGASTRODUODENOSCOPY N/A 12/12/2023   Procedure: EGD (ESOPHAGOGASTRODUODENOSCOPY);  Surgeon: Burnette Fallow, MD;  Location: [facility];  Service: Gastroenterology;  Laterality: N/A;   HEMICOLECTOMY  08/29/2001   perforated abscess   ILEOSTOMY CLOSURE N/A 04/18/2013   Procedure: ILEOSTOMY TAKEDOWN;  Surgeon: Morene ONEIDA Olives, MD;  Location: [facility];  Service: General;  Laterality: N/A;   INTRAOPERATIVE ARTERIOGRAM  03/12/2012   Procedure: INTRA OPERATIVE ARTERIOGRAM;  Surgeon: Lonni GORMAN Blade, MD;  Location: [facility] OR;  Service: Vascular;  Laterality: Right;   PARTIAL COLECTOMY  03/11/2012   Procedure: PARTIAL COLECTOMY;  Surgeon: Morene ONEIDA Olives, MD;  Location: [facility];  Service: General;  Laterality: N/A;  subtotal colectomy transverse and left colon    TRANSMETATARSAL AMPUTATION  05/10/2012   right   UPPER ESOPHAGEAL ENDOSCOPIC ULTRASOUND (EUS) N/A 12/12/2023   Procedure: UPPER ESOPHAGEAL ENDOSCOPIC ULTRASOUND (EUS);  Surgeon: Burnette Fallow, MD;  Location: [facility];  Service: Gastroenterology;  Laterality: N/A;    Family History  Problem Relation Age of Onset   Hypertension Mother    Hyperlipidemia Mother    Hypertension Father    Hyperlipidemia Father    Heart disease Father    Stroke Other        GF, aunts    Hypertension Other        the whole family   Coronary artery disease Neg  Hx    Diabetes Neg Hx    Colon cancer Neg Hx    Prostate cancer Neg Hx    Kidney failure Neg Hx     Social History[3]  ROS Refer to HPI for ROS details.  Objective:    Vitals: BP (!) 145/89 (BP Location: Left Arm)   Pulse 74   Temp 98.7 F (37.1 C) (Oral)   Resp 16   SpO2 99%   Physical Exam Vitals and nursing note reviewed.  Constitutional:      General: He is not in acute distress.    Appearance: He is well-developed. He is not ill-appearing or toxic-appearing.  HENT:     Head:  Normocephalic.     Nose: Nose normal. No congestion or rhinorrhea.     Mouth/Throat:     Mouth: Mucous membranes are moist.     Pharynx: No posterior oropharyngeal erythema.  Cardiovascular:     Rate and Rhythm: Normal rate and regular rhythm.     Heart sounds: Normal heart sounds.  Pulmonary:     Effort: Pulmonary effort is normal. No respiratory distress.     Breath sounds: Normal breath sounds. No stridor. No wheezing, rhonchi or rales.  Chest:     Chest wall: No tenderness.  Skin:    General: Skin is warm and dry.  Neurological:     General: No focal deficit present.     Mental Status: He is alert and oriented to person, place, and time.  Psychiatric:        Mood and Affect: Mood normal.        Behavior: Behavior normal.     Procedures  No results found for this or any previous visit (from the past 24 hours).  Assessment and Plan :     Discharge Instructions       1. Dyspnea, unspecified type (Primary) 2. Pleural effusion - ED EKG completed in UC shows normal sinus rhythm with a ventricular rate of 71 bpm, no STEMI, left axis deviation otherwise normal EKG. - DG Chest 2 View x-ray performed in UC shows no acute cardiopulmonary processes, moderate right pleural effusion noted, mild atelectasis in right lower lung, no sign of consolidation or pneumonia. - predniSONE  (DELTASONE ) 20 MG tablet; Take 2 tablets (40 mg total) by mouth daily for 5 days.  Dispense: 10 tablet; Refill: 0 - albuterol  (VENTOLIN  HFA) 108 (90 Base) MCG/ACT inhaler; Inhale 1-2 puffs into the lungs every 6 (six) hours as needed for wheezing or shortness of breath.  Dispense: 6.7 g; Refill: 0  -Continue to monitor symptoms for any change in severity if there is any escalation of current symptoms or development of new symptoms follow-up in ER for further evaluation and management.      Jernard Reiber B Iyanla Eilers    [1] No current facility-administered medications for this encounter.  Current Outpatient  Medications:    albuterol  (VENTOLIN  HFA) 108 (90 Base) MCG/ACT inhaler, Inhale 1-2 puffs into the lungs every 6 (six) hours as needed for wheezing or shortness of breath., Disp: 6.7 g, Rfl: 0   allopurinol  (ZYLOPRIM ) 300 MG tablet, TAKE 1 TABLET BY MOUTH EVERY DAY, Disp: 90 tablet, Rfl: 1   amLODipine  (NORVASC ) 10 MG tablet, TAKE 1 TABLET BY MOUTH EVERYDAY AT BEDTIME, Disp: 90 tablet, Rfl: 1   amoxicillin -clavulanate (AUGMENTIN ) 875-125 MG tablet, Take 1 tablet by mouth every 12 (twelve) hours., Disp: 14 tablet, Rfl: 0   atorvastatin  (LIPITOR) 40 MG tablet, TAKE 1 TABLET BY MOUTH EVERY DAY,  Disp: 90 tablet, Rfl: 1   azithromycin  (ZITHROMAX ) 250 MG tablet, Take 1 tablet (250 mg total) by mouth daily for 4 days. Take first 2 tablets together, then 1 every day until finished., Disp: 4 tablet, Rfl: 0   carvedilol  (COREG ) 25 MG tablet, TAKE 1 TABLET (25 MG TOTAL) BY MOUTH TWICE A DAY WITH MEALS, Disp: 180 tablet, Rfl: 1   doxazosin  (CARDURA ) 4 MG tablet, Take 1 tablet (4 mg total) by mouth at bedtime., Disp: 90 tablet, Rfl: 1   hyoscyamine (LEVSIN SL) 0.125 MG SL tablet, Place under the tongue 2 (two) times daily as needed. (Patient not taking: Reported on 07/07/2024), Disp: , Rfl:    predniSONE  (DELTASONE ) 20 MG tablet, Take 2 tablets (40 mg total) by mouth daily for 5 days., Disp: 10 tablet, Rfl: 0   spironolactone  (ALDACTONE ) 25 MG tablet, Take 1 tablet (25 mg total) by mouth daily., Disp: 90 tablet, Rfl: 3   triamcinolone  cream (KENALOG ) 0.5 %, Apply 1 Application topically 3 (three) times daily., Disp: 30 g, Rfl: 2   VASCEPA  1 g capsule, TAKE 2 CAPSULES BY MOUTH 2 TIMES DAILY., Disp: 360 capsule, Rfl: 1   warfarin (COUMADIN ) 5 MG tablet, TAKE 1 1/2 TABLETS BY MOUTH DAILY EXCEPT TAKE 1 TABLET ON TUESDAY, THURSDAY AND SATURDAY OR AS DIRECTED BY CLINIC., Disp: 120 tablet, Rfl: 1 [2]  Allergies Allergen Reactions   Olmesartan      Hx of severe AKI requiring HD on ACE per renal note, hx of AKI and  hypotension with olmesartan  07/2023   Mesalamine Rash    REACTION: Rash  [3]  Social History Tobacco Use   Smoking status: Former    Current packs/day: 0.00    Average packs/day: 0.3 packs/day for 9.0 years (2.3 ttl pk-yrs)    Types: Cigarettes    Start date: 05/08/2002    Quit date: 05/09/2011    Years since quitting: 13.2   Smokeless tobacco: Never  Vaping Use   Vaping status: Never Used  Substance Use Topics   Alcohol use: No    Alcohol/week: 0.0 standard drinks of alcohol   Drug use: Yes    Types: Marijuana     Aurea Goodell B, NP 07/23/24 1726

## 2024-07-23 NOTE — Discharge Instructions (Addendum)
°  1. Dyspnea, unspecified type (Primary) 2. Pleural effusion - ED EKG completed in UC shows normal sinus rhythm with a ventricular rate of 71 bpm, no STEMI, left axis deviation otherwise normal EKG. - DG Chest 2 View x-ray performed in UC shows no acute cardiopulmonary processes, moderate right pleural effusion noted, mild atelectasis in right lower lung, no sign of consolidation or pneumonia. - predniSONE  (DELTASONE ) 20 MG tablet; Take 2 tablets (40 mg total) by mouth daily for 5 days.  Dispense: 10 tablet; Refill: 0 - albuterol  (VENTOLIN  HFA) 108 (90 Base) MCG/ACT inhaler; Inhale 1-2 puffs into the lungs every 6 (six) hours as needed for wheezing or shortness of breath.  Dispense: 6.7 g; Refill: 0  -Continue to monitor symptoms for any change in severity if there is any escalation of current symptoms or development of new symptoms follow-up in ER for further evaluation and management.

## 2024-07-23 NOTE — ED Triage Notes (Signed)
 Patient here today with c/o SOB X 3 weeks. Patient was at the ED 5 days ago. Patient is taking his antibiotic but has not had any improvement. Patient states that he feels about the same. Worsening SOB with lying down and bending over.

## 2024-08-04 ENCOUNTER — Other Ambulatory Visit: Payer: Self-pay | Admitting: Gastroenterology

## 2024-08-05 ENCOUNTER — Ambulatory Visit (INDEPENDENT_AMBULATORY_CARE_PROVIDER_SITE_OTHER)

## 2024-08-05 DIAGNOSIS — Z7901 Long term (current) use of anticoagulants: Secondary | ICD-10-CM | POA: Diagnosis not present

## 2024-08-05 LAB — POCT INR: INR: 3.3 — AB (ref 2.0–3.0)

## 2024-08-05 NOTE — Patient Instructions (Addendum)
 Pre visit review using our clinic review tool, if applicable. No additional management support is needed unless otherwise documented below in the visit note.  Hold warfarin tomorrow and then change weekly dose to take 1 1/2 tablets daily except take 1 tablet on Monday, Wednesday and Friday. Recheck in 2 weeks.

## 2024-08-05 NOTE — Progress Notes (Signed)
 Indication: antithrombin III  deficiency, DVT Pt in UC for SOB on 12/17 and was prescribed prednisone . Pt has finished medication.  Pt already took warfarin today. Hold warfarin tomorrow and then change weekly dose to take 1 1/2 tablets daily except take 1 tablet on Monday, Wednesday and Friday. Recheck in 2 weeks.

## 2024-08-06 ENCOUNTER — Other Ambulatory Visit: Payer: Self-pay | Admitting: Internal Medicine

## 2024-08-06 DIAGNOSIS — I10 Essential (primary) hypertension: Secondary | ICD-10-CM

## 2024-08-07 ENCOUNTER — Encounter (HOSPITAL_COMMUNITY): Payer: Self-pay

## 2024-08-07 ENCOUNTER — Emergency Department (HOSPITAL_COMMUNITY)

## 2024-08-07 ENCOUNTER — Other Ambulatory Visit: Payer: Self-pay

## 2024-08-07 ENCOUNTER — Ambulatory Visit (HOSPITAL_COMMUNITY): Admission: EM | Admit: 2024-08-07 | Discharge: 2024-08-07 | Disposition: A | Discharge status: Home / Self Care

## 2024-08-07 ENCOUNTER — Inpatient Hospital Stay (HOSPITAL_COMMUNITY)
Admission: EM | Admit: 2024-08-07 | Discharge: 2024-08-23 | DRG: 378 | Disposition: A | Discharge status: Home / Self Care | Attending: Internal Medicine | Admitting: Internal Medicine

## 2024-08-07 DIAGNOSIS — E8809 Other disorders of plasma-protein metabolism, not elsewhere classified: Secondary | ICD-10-CM | POA: Diagnosis present

## 2024-08-07 DIAGNOSIS — I739 Peripheral vascular disease, unspecified: Secondary | ICD-10-CM | POA: Diagnosis present

## 2024-08-07 DIAGNOSIS — C269 Malignant neoplasm of ill-defined sites within the digestive system: Secondary | ICD-10-CM | POA: Diagnosis present

## 2024-08-07 DIAGNOSIS — E1122 Type 2 diabetes mellitus with diabetic chronic kidney disease: Secondary | ICD-10-CM | POA: Diagnosis present

## 2024-08-07 DIAGNOSIS — N2889 Other specified disorders of kidney and ureter: Secondary | ICD-10-CM | POA: Diagnosis present

## 2024-08-07 DIAGNOSIS — Z79899 Other long term (current) drug therapy: Secondary | ICD-10-CM

## 2024-08-07 DIAGNOSIS — K922 Gastrointestinal hemorrhage, unspecified: Principal | ICD-10-CM | POA: Diagnosis present

## 2024-08-07 DIAGNOSIS — R042 Hemoptysis: Secondary | ICD-10-CM | POA: Diagnosis not present

## 2024-08-07 DIAGNOSIS — Z8249 Family history of ischemic heart disease and other diseases of the circulatory system: Secondary | ICD-10-CM

## 2024-08-07 DIAGNOSIS — K264 Chronic or unspecified duodenal ulcer with hemorrhage: Principal | ICD-10-CM | POA: Diagnosis present

## 2024-08-07 DIAGNOSIS — E785 Hyperlipidemia, unspecified: Secondary | ICD-10-CM | POA: Diagnosis present

## 2024-08-07 DIAGNOSIS — R195 Other fecal abnormalities: Secondary | ICD-10-CM

## 2024-08-07 DIAGNOSIS — R591 Generalized enlarged lymph nodes: Secondary | ICD-10-CM | POA: Diagnosis present

## 2024-08-07 DIAGNOSIS — Z974 Presence of external hearing-aid: Secondary | ICD-10-CM

## 2024-08-07 DIAGNOSIS — R0609 Other forms of dyspnea: Secondary | ICD-10-CM | POA: Diagnosis present

## 2024-08-07 DIAGNOSIS — Z6828 Body mass index (BMI) 28.0-28.9, adult: Secondary | ICD-10-CM

## 2024-08-07 DIAGNOSIS — E871 Hypo-osmolality and hyponatremia: Secondary | ICD-10-CM | POA: Diagnosis present

## 2024-08-07 DIAGNOSIS — J90 Pleural effusion, not elsewhere classified: Secondary | ICD-10-CM | POA: Diagnosis present

## 2024-08-07 DIAGNOSIS — K5010 Crohn's disease of large intestine without complications: Secondary | ICD-10-CM | POA: Diagnosis present

## 2024-08-07 DIAGNOSIS — Z87891 Personal history of nicotine dependence: Secondary | ICD-10-CM

## 2024-08-07 DIAGNOSIS — F32A Depression, unspecified: Secondary | ICD-10-CM | POA: Diagnosis present

## 2024-08-07 DIAGNOSIS — J910 Malignant pleural effusion: Secondary | ICD-10-CM | POA: Diagnosis present

## 2024-08-07 DIAGNOSIS — H9192 Unspecified hearing loss, left ear: Secondary | ICD-10-CM | POA: Diagnosis present

## 2024-08-07 DIAGNOSIS — Z86718 Personal history of other venous thrombosis and embolism: Secondary | ICD-10-CM

## 2024-08-07 DIAGNOSIS — E876 Hypokalemia: Secondary | ICD-10-CM | POA: Diagnosis present

## 2024-08-07 DIAGNOSIS — N281 Cyst of kidney, acquired: Secondary | ICD-10-CM | POA: Diagnosis present

## 2024-08-07 DIAGNOSIS — Z9049 Acquired absence of other specified parts of digestive tract: Secondary | ICD-10-CM

## 2024-08-07 DIAGNOSIS — Z888 Allergy status to other drugs, medicaments and biological substances status: Secondary | ICD-10-CM

## 2024-08-07 DIAGNOSIS — C7801 Secondary malignant neoplasm of right lung: Secondary | ICD-10-CM | POA: Diagnosis present

## 2024-08-07 DIAGNOSIS — Z7901 Long term (current) use of anticoagulants: Secondary | ICD-10-CM

## 2024-08-07 DIAGNOSIS — I129 Hypertensive chronic kidney disease with stage 1 through stage 4 chronic kidney disease, or unspecified chronic kidney disease: Secondary | ICD-10-CM | POA: Diagnosis present

## 2024-08-07 DIAGNOSIS — K648 Other hemorrhoids: Secondary | ICD-10-CM | POA: Diagnosis present

## 2024-08-07 DIAGNOSIS — K921 Melena: Secondary | ICD-10-CM

## 2024-08-07 DIAGNOSIS — I743 Embolism and thrombosis of arteries of the lower extremities: Secondary | ICD-10-CM | POA: Diagnosis present

## 2024-08-07 DIAGNOSIS — Z800 Family history of malignant neoplasm of digestive organs: Secondary | ICD-10-CM

## 2024-08-07 DIAGNOSIS — R911 Solitary pulmonary nodule: Secondary | ICD-10-CM | POA: Diagnosis present

## 2024-08-07 DIAGNOSIS — E1151 Type 2 diabetes mellitus with diabetic peripheral angiopathy without gangrene: Secondary | ICD-10-CM | POA: Diagnosis present

## 2024-08-07 DIAGNOSIS — E118 Type 2 diabetes mellitus with unspecified complications: Secondary | ICD-10-CM | POA: Diagnosis present

## 2024-08-07 DIAGNOSIS — D175 Benign lipomatous neoplasm of intra-abdominal organs: Secondary | ICD-10-CM | POA: Diagnosis present

## 2024-08-07 DIAGNOSIS — N1831 Chronic kidney disease, stage 3a: Secondary | ICD-10-CM | POA: Diagnosis present

## 2024-08-07 DIAGNOSIS — E663 Overweight: Secondary | ICD-10-CM | POA: Diagnosis present

## 2024-08-07 DIAGNOSIS — R590 Localized enlarged lymph nodes: Secondary | ICD-10-CM

## 2024-08-07 DIAGNOSIS — D62 Acute posthemorrhagic anemia: Secondary | ICD-10-CM | POA: Diagnosis present

## 2024-08-07 DIAGNOSIS — K449 Diaphragmatic hernia without obstruction or gangrene: Secondary | ICD-10-CM | POA: Diagnosis present

## 2024-08-07 DIAGNOSIS — D6859 Other primary thrombophilia: Secondary | ICD-10-CM | POA: Diagnosis present

## 2024-08-07 DIAGNOSIS — K429 Umbilical hernia without obstruction or gangrene: Secondary | ICD-10-CM | POA: Diagnosis present

## 2024-08-07 LAB — CBC WITH DIFFERENTIAL/PLATELET
Abs Immature Granulocytes: 0.03 K/uL (ref 0.00–0.07)
Basophils Absolute: 0.1 K/uL (ref 0.0–0.1)
Basophils Relative: 1 %
Eosinophils Absolute: 0.1 K/uL (ref 0.0–0.5)
Eosinophils Relative: 1 %
HCT: 39.5 % (ref 39.0–52.0)
Hemoglobin: 13.2 g/dL (ref 13.0–17.0)
Immature Granulocytes: 0 %
Lymphocytes Relative: 20 %
Lymphs Abs: 1.7 K/uL (ref 0.7–4.0)
MCH: 30.8 pg (ref 26.0–34.0)
MCHC: 33.4 g/dL (ref 30.0–36.0)
MCV: 92.1 fL (ref 80.0–100.0)
Monocytes Absolute: 0.8 K/uL (ref 0.1–1.0)
Monocytes Relative: 9 %
Neutro Abs: 5.9 K/uL (ref 1.7–7.7)
Neutrophils Relative %: 69 %
Platelets: 317 K/uL (ref 150–400)
RBC: 4.29 MIL/uL (ref 4.22–5.81)
RDW: 13.5 % (ref 11.5–15.5)
WBC: 8.5 K/uL (ref 4.0–10.5)
nRBC: 0 % (ref 0.0–0.2)

## 2024-08-07 LAB — COMPREHENSIVE METABOLIC PANEL WITH GFR
ALT: 27 U/L (ref 0–44)
AST: 17 U/L (ref 15–41)
Albumin: 3.8 g/dL (ref 3.5–5.0)
Alkaline Phosphatase: 87 U/L (ref 38–126)
Anion gap: 10 (ref 5–15)
BUN: 25 mg/dL — ABNORMAL HIGH (ref 8–23)
CO2: 27 mmol/L (ref 22–32)
Calcium: 9.4 mg/dL (ref 8.9–10.3)
Chloride: 101 mmol/L (ref 98–111)
Creatinine, Ser: 0.89 mg/dL (ref 0.61–1.24)
GFR, Estimated: >60 mL/min
Glucose, Bld: 104 mg/dL — ABNORMAL HIGH (ref 70–99)
Potassium: 4.2 mmol/L (ref 3.5–5.1)
Sodium: 138 mmol/L (ref 135–145)
Total Bilirubin: 0.9 mg/dL (ref 0.0–1.2)
Total Protein: 6.6 g/dL (ref 6.5–8.1)

## 2024-08-07 NOTE — ED Triage Notes (Signed)
 C/O blood in stool and SHOB for 2 weeks. Denies blood in urine. Denies nausea/vomiting. Denies abd pain. Axox4.

## 2024-08-07 NOTE — ED Provider Triage Note (Signed)
 Emergency Medicine Provider Triage Evaluation Note  Rick Edwards , a 69 y.o. male  was evaluated in triage.  Pt complains of blood in his stool as well as shortness of breath.  Patient states that today he reports blood in his stool, and describes it as dark and black looking.  He denies any bright red blood.  He also states that he has been short of breath for the last 2 weeks.  Patient was evaluated urgent care, and advised to come to the emergency department for further evaluation.  Patient denies any new medications including Pepto-Bismol and iron.  Review of Systems  Positive: Shortness of breath, blood in stool Negative: Chest pain, abdominal pain, nausea, vomiting  Physical Exam  There were no vitals taken for this visit. Gen:   Awake, no distress   Resp:  Normal effort  MSK:   Moves extremities without difficulty  Other:    Medical Decision Making  Medically screening exam initiated at 1:47 PM.  Appropriate orders placed.  Rick Edwards was informed that the remainder of the evaluation will be completed by another provider, this initial triage assessment does not replace that evaluation, and the importance of remaining in the ED until their evaluation is complete.  Basic labs, chest x-ray, EKG ordered   Rick Marry RAMAN, PA-C 08/07/24 1352

## 2024-08-07 NOTE — ED Notes (Signed)
 Patient is being discharged from the Urgent Care and sent to the Emergency Department via pov . Per Manuelita Search, PA, patient is in need of higher level of care due to limited resources, gi bleed, on blood thinners. Patient is aware and verbalizes understanding of plan of care.  Vitals:   08/07/24 1312  BP: 119/80  Pulse: 81  Resp: 16  Temp: 98.1 F (36.7 C)  SpO2: 94%

## 2024-08-07 NOTE — ED Provider Notes (Signed)
 Patient complains of abdominal pain, blood in stool, loose stools.  Patient currently takes Coumadin .  Patient redirected to the emergency room for further evaluation of GI bleed.   [name], PA-C 08/07/24 1328

## 2024-08-07 NOTE — ED Triage Notes (Addendum)
 Patient reports that he began having a squeezing pain in his abdomen 2 days ago. Today he noted dark red blood in his stool twice today. Patient is currently taking Warfarin.  Patient denies taking any medication for his symptoms.

## 2024-08-08 ENCOUNTER — Emergency Department (HOSPITAL_COMMUNITY)

## 2024-08-08 DIAGNOSIS — K921 Melena: Secondary | ICD-10-CM | POA: Diagnosis not present

## 2024-08-08 DIAGNOSIS — K922 Gastrointestinal hemorrhage, unspecified: Secondary | ICD-10-CM | POA: Diagnosis present

## 2024-08-08 LAB — BASIC METABOLIC PANEL WITH GFR
Anion gap: 10 (ref 5–15)
BUN: 36 mg/dL — ABNORMAL HIGH (ref 8–23)
CO2: 25 mmol/L (ref 22–32)
Calcium: 9 mg/dL (ref 8.9–10.3)
Chloride: 102 mmol/L (ref 98–111)
Creatinine, Ser: 0.95 mg/dL (ref 0.61–1.24)
GFR, Estimated: >60 mL/min
Glucose, Bld: 106 mg/dL — ABNORMAL HIGH (ref 70–99)
Potassium: 4.1 mmol/L (ref 3.5–5.1)
Sodium: 136 mmol/L (ref 135–145)

## 2024-08-08 LAB — CBC
HCT: 35.4 % — ABNORMAL LOW (ref 39.0–52.0)
Hemoglobin: 12.1 g/dL — ABNORMAL LOW (ref 13.0–17.0)
MCH: 31.4 pg (ref 26.0–34.0)
MCHC: 34.2 g/dL (ref 30.0–36.0)
MCV: 91.9 fL (ref 80.0–100.0)
Platelets: 291 K/uL (ref 150–400)
RBC: 3.85 MIL/uL — ABNORMAL LOW (ref 4.22–5.81)
RDW: 13.6 % (ref 11.5–15.5)
WBC: 9.2 K/uL (ref 4.0–10.5)
nRBC: 0 % (ref 0.0–0.2)

## 2024-08-08 LAB — TYPE AND SCREEN
ABO/RH(D): O POS
Antibody Screen: NEGATIVE

## 2024-08-08 LAB — PROTIME-INR
INR: 2.7 — ABNORMAL HIGH (ref 0.8–1.2)
Prothrombin Time: 30.2 s — ABNORMAL HIGH (ref 11.4–15.2)

## 2024-08-08 LAB — PRO BRAIN NATRIURETIC PEPTIDE: Pro Brain Natriuretic Peptide: <50 pg/mL

## 2024-08-08 LAB — TROPONIN T, HIGH SENSITIVITY: Troponin T High Sensitivity: <15 ng/L (ref 0–19)

## 2024-08-08 MED ORDER — HEPARIN SODIUM (PORCINE) 5000 UNIT/ML IJ SOLN: 5000.0000 [IU] | Freq: Three times a day (TID) | INTRAMUSCULAR | Status: DC

## 2024-08-08 MED ORDER — SODIUM CHLORIDE 0.9% FLUSH
3.0000 mL | Freq: Two times a day (BID) | INTRAVENOUS | Status: DC
  Administered 2024-08-08 – 2024-08-23 (×23): 3 mL via INTRAVENOUS

## 2024-08-08 MED ORDER — ALLOPURINOL 300 MG PO TABS
300.0000 mg | ORAL_TABLET | Freq: Every day | ORAL | Status: DC
  Administered 2024-08-08 – 2024-08-23 (×15): 300 mg via ORAL
  Filled 2024-08-08: qty 3
  Filled 2024-08-08 (×7): qty 1
  Filled 2024-08-08 (×2): qty 3
  Filled 2024-08-08 (×6): qty 1

## 2024-08-08 MED ORDER — PANTOPRAZOLE SODIUM 40 MG IV SOLR
80.0000 mg | Freq: Once | INTRAVENOUS | Status: AC
  Administered 2024-08-08: 80 mg via INTRAVENOUS
  Filled 2024-08-08: qty 20

## 2024-08-08 MED ORDER — ONDANSETRON HCL 4 MG PO TABS: 4.0000 mg | ORAL_TABLET | Freq: Four times a day (QID) | ORAL | Status: DC | PRN

## 2024-08-08 MED ORDER — ACETAMINOPHEN 325 MG PO TABS
650.0000 mg | ORAL_TABLET | Freq: Four times a day (QID) | ORAL | Status: DC | PRN
  Administered 2024-08-09 – 2024-08-22 (×3): 650 mg via ORAL
  Filled 2024-08-08 (×3): qty 2

## 2024-08-08 MED ORDER — IOHEXOL 350 MG/ML SOLN
75.0000 mL | Freq: Once | INTRAVENOUS | Status: AC | PRN
  Administered 2024-08-08: 75 mL via INTRAVENOUS

## 2024-08-08 MED ORDER — SODIUM CHLORIDE 0.9% FLUSH
3.0000 mL | Freq: Two times a day (BID) | INTRAVENOUS | Status: DC
  Administered 2024-08-08 – 2024-08-23 (×17): 3 mL via INTRAVENOUS

## 2024-08-08 MED ORDER — ATORVASTATIN CALCIUM 40 MG PO TABS
40.0000 mg | ORAL_TABLET | Freq: Every day | ORAL | Status: DC
  Administered 2024-08-08 – 2024-08-23 (×15): 40 mg via ORAL
  Filled 2024-08-08 (×16): qty 1

## 2024-08-08 MED ORDER — ONDANSETRON HCL 4 MG/2ML IJ SOLN
4.0000 mg | Freq: Four times a day (QID) | INTRAMUSCULAR | Status: DC | PRN
  Administered 2024-08-14: 4 mg via INTRAVENOUS
  Filled 2024-08-08: qty 2

## 2024-08-08 MED ORDER — DOXAZOSIN MESYLATE 4 MG PO TABS
4.0000 mg | ORAL_TABLET | Freq: Every day | ORAL | Status: DC
  Administered 2024-08-08 – 2024-08-22 (×15): 4 mg via ORAL
  Filled 2024-08-08 (×15): qty 1

## 2024-08-08 MED ORDER — TRAZODONE HCL 50 MG PO TABS
25.0000 mg | ORAL_TABLET | Freq: Every evening | ORAL | Status: DC | PRN
  Administered 2024-08-12 – 2024-08-22 (×8): 25 mg via ORAL
  Filled 2024-08-08 (×8): qty 1

## 2024-08-08 MED ORDER — ACETAMINOPHEN 650 MG RE SUPP: 650.0000 mg | Freq: Four times a day (QID) | RECTAL | Status: DC | PRN

## 2024-08-08 MED ORDER — PANTOPRAZOLE SODIUM 40 MG IV SOLR
40.0000 mg | Freq: Two times a day (BID) | INTRAVENOUS | Status: DC
  Administered 2024-08-08 – 2024-08-11 (×6): 40 mg via INTRAVENOUS
  Filled 2024-08-08 (×6): qty 10

## 2024-08-08 MED ORDER — MELATONIN 5 MG PO TABS
5.0000 mg | ORAL_TABLET | Freq: Every day | ORAL | Status: DC
  Administered 2024-08-08 – 2024-08-22 (×15): 5 mg via ORAL
  Filled 2024-08-08 (×14): qty 1

## 2024-08-08 MED ORDER — PANTOPRAZOLE SODIUM 40 MG IV SOLR: 40.0000 mg | Freq: Two times a day (BID) | INTRAVENOUS | Status: DC

## 2024-08-08 MED ORDER — HYDROMORPHONE HCL 1 MG/ML IJ SOLN
0.5000 mg | INTRAMUSCULAR | Status: DC | PRN
  Administered 2024-08-08: 1 mg via INTRAVENOUS
  Administered 2024-08-09: 0.5 mg via INTRAVENOUS
  Filled 2024-08-08 (×2): qty 1
  Filled 2024-08-08: qty 0.5

## 2024-08-08 MED ORDER — IPRATROPIUM BROMIDE 0.02 % IN SOLN: 0.5000 mg | Freq: Four times a day (QID) | RESPIRATORY_TRACT | Status: DC | PRN

## 2024-08-08 MED ORDER — SPIRONOLACTONE 25 MG PO TABS
25.0000 mg | ORAL_TABLET | Freq: Every day | ORAL | Status: DC
  Administered 2024-08-09 – 2024-08-23 (×14): 25 mg via ORAL
  Filled 2024-08-08 (×16): qty 1

## 2024-08-08 MED ORDER — FUROSEMIDE 10 MG/ML IJ SOLN
20.0000 mg | Freq: Once | INTRAMUSCULAR | Status: AC
  Administered 2024-08-08: 20 mg via INTRAVENOUS
  Filled 2024-08-08: qty 2

## 2024-08-08 MED ORDER — OXYCODONE HCL 5 MG PO TABS
5.0000 mg | ORAL_TABLET | ORAL | Status: DC | PRN
  Administered 2024-08-13 – 2024-08-23 (×19): 5 mg via ORAL
  Filled 2024-08-08 (×20): qty 1

## 2024-08-08 MED ORDER — SENNOSIDES-DOCUSATE SODIUM 8.6-50 MG PO TABS
1.0000 | ORAL_TABLET | Freq: Two times a day (BID) | ORAL | Status: DC
  Administered 2024-08-09 – 2024-08-23 (×16): 1 via ORAL
  Filled 2024-08-08 (×25): qty 1

## 2024-08-08 MED ORDER — CARVEDILOL 12.5 MG PO TABS
25.0000 mg | ORAL_TABLET | Freq: Two times a day (BID) | ORAL | Status: DC
  Administered 2024-08-08 – 2024-08-09 (×2): 25 mg via ORAL
  Filled 2024-08-08 (×2): qty 2

## 2024-08-08 MED ORDER — SODIUM CHLORIDE 0.9 % IV SOLN: INTRAVENOUS | Status: AC

## 2024-08-08 MED ORDER — HYDRALAZINE HCL 20 MG/ML IJ SOLN: 10.0000 mg | INTRAMUSCULAR | Status: DC | PRN

## 2024-08-08 NOTE — Assessment & Plan Note (Signed)
 Chronically anticoagulated on Coumadin.

## 2024-08-08 NOTE — ED Notes (Signed)
 Pt provided recliner to sit in due to wait time.

## 2024-08-08 NOTE — Assessment & Plan Note (Signed)
 History of pleural effusion Evidence again on CT scan -Likely contributing to shortness of breath - Will consult IR for possible thoracentesis Will follow with the labs

## 2024-08-08 NOTE — ED Notes (Signed)
 Pt denies actively bleeding from rectum at this time

## 2024-08-08 NOTE — Assessment & Plan Note (Signed)
 Chronically anticoagulated on Coumadin  with INR 2.7 -Holding Coumadin  for now due to GI bleed -As patient is not actively bleeding, not reversing INR at this poin

## 2024-08-08 NOTE — Assessment & Plan Note (Signed)
 Continue statins

## 2024-08-08 NOTE — Assessment & Plan Note (Signed)
 Monitor BUN/creatinine closely, currently at baseline Avoiding hypotension, nephrotoxins

## 2024-08-08 NOTE — Assessment & Plan Note (Signed)
No signs of exacerbation, stable

## 2024-08-08 NOTE — Assessment & Plan Note (Signed)
-   Monitoring H&H closely -Gentle IV fluid hydration -Holding Coumadin  -INR 2.7, monitoring daily -GI consulted-appreciate further evaluation recommendation -N.p.o. after midnight -40 mg IV Protonix  twice daily  Per GI: Patient has a previous GI bleed, colostomy in which was taken down on 2014.  Primary gastroenterologist Dr. Herschell had an upper endoscopy on 5/25 with Dr. Alana which showed a small hiatal hernia, benign-appearing esophageal GL stenosis, submucosal papules and stomach, intramural lesions and a pyloric lesion of the stomach suspicious for lipoma

## 2024-08-08 NOTE — Hospital Course (Addendum)
 Rick Edwards is a 69 year old male with extensive history of Antithrombin III  deficiency chronically on Coumadin , CKD, Crohn's disease, chronic anemia, hypercalcemic hyperkalemia HTN, ileostomy/takedown 2014, PVD,... Presented to ED with chief complaint of black-colored stool and shortness of breath and was found to have moderate size right pleural effusion and possible GI bleed. GI consulted. Status post EGD and due to oozing ulcer which was stopped. Cytology from pleural fluid shows adenocarcinoma. Oncology and pulmonology involved. Details below.   Assessment and Plan:  Acute blood loss anemia secondary to duodenal ulcer/upper GI bleed, POA: Patient's baseline hemoglobin is around 14 which dropped to 10.3 however has remained stable between 10-11 for last 5 days. Hgb/Hct Trend:  Recent Labs  Lab 08/10/24 1648 08/11/24 0224 08/12/24 0509 08/13/24 0520 08/14/24 0211 08/15/24 0537 08/16/24 0456  HGB 10.8* 10.2* 10.1* 10.2* 10.5* 10.0* 9.4*  HCT 32.1* 30.1* 29.4* 29.2* 30.6* 29.4* 27.8*  MCV 92.8 92.0 92.2 89.6 90.3 92.7 92.4  -FOBT ordered but is still pending.  Seen by GI, status post EGD 08/12/2023, was found to have oozing duodenal ulcer which was stopped by hemostatic spray.  GI recommended IV PPI to every 6 hours.  No further episodes of bleeding, hemoglobin is stable despite of starting on heparin .   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  -CEA was 119.0 GI plans to repeat EGD if no conclusion with lung biopsy.  [facility] will need to be contacted back if repeat EGD is needed.   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  Underwent thoracentesis by IR 08/09/2024 with a yield of 100 cc fluid.  Fluid analysis consistent with lymphocyte predominant exudative fluid. cytology came back positive for adenocarcinoma unfortunately.   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  Oncology consulted, they are also suspecting possible pulmonary cancer primary due to left upper lobe nodule, pulmonary consulted, patient underwent video bronchoscopy with endobronchial ultrasound and electromagnetic navigation procedure 08/14/2024.   -Cytology/pathology pending and still in Process.   -In the meantime, CEA came back significantly elevated at 119.0.  Defer further management to oncology.   Antithrombin 3 deficiency/supratherapeutic INR: Chronically anticoagulated on Coumadin , INR 2.7 upon arrival.  Received vitamin K  for reversal.  Currently remain on Heparin  gtt.  GI recommends holding Coumadin  for at least 1 week/until 08/18/2024.   Right renal mass: Multiple bilateral renal cysts, 1 including 2.1 cm right kidney possible RCC On a CT scan-EDP Dr. Elnor has discussed the findings with on-call urologist Does not recommend any inpatient further workup -to follow-up as an outpatient.   Type 2 Diabetes Mellitus: Last hemoglobin A1c 6.2, currently not on any medications. CBG Trend:  Recent Labs  Lab 08/12/24 1121 08/12/24 1548 08/14/24 0730 08/14/24 1137 08/15/24 0816 08/16/24 0407 08/16/24 0744  GLUCAP 102* 110* 105* 111* 127* 108* 91   Hypokalemia: K+ is now 3.3. Replete w/ po KCL 40 mE BID x2. CTM and Replete as Necessary. Repeat CMP in the AM   Stage III CKDa: At Baseline. BUN/Cr Trend: Recent Labs  Lab 07/18/24 1703 08/07/24 1352 08/08/24 1037 08/09/24 0830 08/10/24 0627 08/16/24 1005  BUN 13 25* 36* 23 16 11   CREATININE 1.00 0.89 0.95 0.92 0.91 0.98  -Avoid Nephrotoxic Medications, Contrast Dyes, Hypotension and Dehydration to Ensure Adequate Renal Perfusion and will need to Renally Adjust Meds. CTM & Trend Renal Function carefully & repeat CMP in the  AM   History of chron's disease: Stable.   PVD: Continue statins.   Dyslipidemia: Continue statin.   Hypoalbuminemia: Patient's Albumin  Lvl went from 3.8  -> 3.1. CTM & Trend & repeat CMP in the AM  Overweight: Complicates overall prognosis and care. Estimated body mass index is 28.81 kg/m as calculated from the following:   Height as of this encounter: 5' 9 (1.753 m).   Weight as of this encounter: 88.5 kg. Weight Loss and Dietary Counseling given

## 2024-08-08 NOTE — Assessment & Plan Note (Signed)
 Multiple bilateral renal cysts, 1 including 2.1 cm right kidney possible RCC On a CT scan -EDP Dr. Elnor has discussed the findings with on-call urologist Does not recommend any inpatient further workup -to follow-up as an outpatient

## 2024-08-08 NOTE — Consult Note (Signed)
 [facility] Consult  Referring Provider: No ref. provider found Primary Care Physician:  Joshua Debby CROME, MD Primary Gastroenterologist: [facility] Elicia  Reason for Consultation: Melena, anemia  SUBJECTIVE:   HPI: Rick Edwards is a 69 y.o. male with past medical history significant for Crohn's disease status post colectomy with end-to-end ileocolonic anastomosis, healthy appearance on flexible sigmoidoscopy 09/04/2023 by Dr. Elicia.  Findings also significant for internal hemorrhoids and skin tags.  Underwent endoscopic ultrasound on 12/12/2023 with Dr. Burnette given submucosal lesion noted on EGD, findings of small hiatal hernia, Schatzki ring, lipoma.  Patient evaluated in hallway in the emergency department.  Brother at bedside.  Noted that he has been experiencing shortness of breath over the past month.  Began to have never before melena yesterday.  No NSAID use.  Uses Coumadin  for Antithrombin III  deficiency.  No current therapy for Crohn's disease.  No abdominal pain.  No nausea or vomiting.  Past Medical History:  Diagnosis Date   Anemia    anemia thrombocytopenia, saw Hematology 2011, can not r/o myeloproliferative d/c   Antithrombin III  deficiency 05/16/2013   On coumadin  for this   Blood clot in vein    in right leg   Chronic kidney disease 05/27/2013   ACUTE RENAL FAILURE    Crohn's disease (HCC)    tx. Imuran    GI bleed 6/11   w/ normal EGD 6/11, 3 PRBCs    Hearing loss    wears hearing aid left ear; birthed with hearing loss   HOH (hard of hearing) 05/16/2013   Hypercalcemia    Hyperkalemia 05/27/2013   Hypertension    Ileostomy in place [facility]) 04-11-13   04-18-13 ileostomy to be taken down.   Perianal abscess 2001   s/p right hemicolectomy, and drainage of retroperitoneal abcess 2003   Peripheral vascular disease    Transfusion history    last 8'13   Past Surgical History:  Procedure Laterality Date   Anal fistulotomy  2002   EMBOLECTOMY   03/12/2012   Procedure: EMBOLECTOMY;  Surgeon: Lonni GORMAN Blade, MD;  Location: [facility] OR;  Service: Vascular;  Laterality: Right;  Right popliteal embolectomy with vein patch angioplasty, right posterior tibial embolectomy with vein patch angioplasty    ESOPHAGOGASTRODUODENOSCOPY N/A 12/12/2023   Procedure: EGD (ESOPHAGOGASTRODUODENOSCOPY);  Surgeon: Burnette Fallow, MD;  Location: [facility];  Service: Gastroenterology;  Laterality: N/A;   HEMICOLECTOMY  08/29/2001   perforated abscess   ILEOSTOMY CLOSURE N/A 04/18/2013   Procedure: ILEOSTOMY TAKEDOWN;  Surgeon: Morene ONEIDA Olives, MD;  Location: [facility]S;  Service: General;  Laterality: N/A;   INTRAOPERATIVE ARTERIOGRAM  03/12/2012   Procedure: INTRA OPERATIVE ARTERIOGRAM;  Surgeon: Lonni GORMAN Blade, MD;  Location: [facility] OR;  Service: Vascular;  Laterality: Right;   PARTIAL COLECTOMY  03/11/2012   Procedure: PARTIAL COLECTOMY;  Surgeon: Morene ONEIDA Olives, MD;  Location: [facility];  Service: General;  Laterality: N/A;  subtotal colectomy transverse and left colon    TRANSMETATARSAL AMPUTATION  05/10/2012   right   UPPER ESOPHAGEAL ENDOSCOPIC ULTRASOUND (EUS) N/A 12/12/2023   Procedure: UPPER ESOPHAGEAL ENDOSCOPIC ULTRASOUND (EUS);  Surgeon: Burnette Fallow, MD;  Location: [facility];  Service: Gastroenterology;  Laterality: N/A;   Prior to Admission medications  Medication Sig Start Date End Date Taking? Authorizing Provider  albuterol  (VENTOLIN  HFA) 108 (90 Base) MCG/ACT inhaler Inhale 1-2 puffs into the lungs every 6 (six) hours as needed for wheezing or shortness of breath. 07/23/24   Reddick, Johnathan B, NP  allopurinol  (  ZYLOPRIM ) 300 MG tablet TAKE 1 TABLET BY MOUTH EVERY DAY 07/21/24   Joshua Debby CROME, MD  amLODipine  (NORVASC ) 10 MG tablet TAKE 1 TABLET BY MOUTH EVERYDAY AT BEDTIME 07/21/24   Joshua Debby CROME, MD  amoxicillin -clavulanate (AUGMENTIN ) 875-125 MG tablet Take 1 tablet by mouth every 12 (twelve) hours. Patient not taking:  Reported on 08/07/2024 07/19/24   Minnie Tinnie BRAVO, PA  atorvastatin  (LIPITOR) 40 MG tablet TAKE 1 TABLET BY MOUTH EVERY DAY 07/11/24   Joshua Debby CROME, MD  carvedilol  (COREG ) 25 MG tablet TAKE 1 TABLET (25 MG TOTAL) BY MOUTH TWICE A DAY WITH MEALS 01/22/24   Joshua Debby CROME, MD  doxazosin  (CARDURA ) 4 MG tablet Take 1 tablet (4 mg total) by mouth at bedtime. 05/26/24   Joshua Debby CROME, MD  hyoscyamine (LEVSIN SL) 0.125 MG SL tablet Place under the tongue 2 (two) times daily as needed. Patient not taking: Reported on 07/07/2024 02/25/20   [provider]  spironolactone  (ALDACTONE ) 25 MG tablet Take 1 tablet (25 mg total) by mouth daily. 10/26/23   Joshua Debby CROME, MD  triamcinolone  cream (KENALOG ) 0.5 % Apply 1 Application topically 3 (three) times daily. Patient not taking: Reported on 08/07/2024 06/12/23   Joshua Debby CROME, MD  VASCEPA  1 g capsule TAKE 2 CAPSULES BY MOUTH 2 TIMES DAILY. 11/05/23   Joshua Debby CROME, MD  warfarin (COUMADIN ) 5 MG tablet TAKE 1 1/2 TABLETS BY MOUTH DAILY EXCEPT TAKE 1 TABLET ON TUESDAY, THURSDAY AND SATURDAY OR AS DIRECTED BY CLINIC. 07/21/24   Joshua Debby CROME, MD   No current facility-administered medications for this encounter.   Current Outpatient Medications  Medication Sig Dispense Refill   albuterol  (VENTOLIN  HFA) 108 (90 Base) MCG/ACT inhaler Inhale 1-2 puffs into the lungs every 6 (six) hours as needed for wheezing or shortness of breath. 6.7 g 0   allopurinol  (ZYLOPRIM ) 300 MG tablet TAKE 1 TABLET BY MOUTH EVERY DAY 90 tablet 1   amLODipine  (NORVASC ) 10 MG tablet TAKE 1 TABLET BY MOUTH EVERYDAY AT BEDTIME 90 tablet 1   amoxicillin -clavulanate (AUGMENTIN ) 875-125 MG tablet Take 1 tablet by mouth every 12 (twelve) hours. (Patient not taking: Reported on 08/07/2024) 14 tablet 0   atorvastatin  (LIPITOR) 40 MG tablet TAKE 1 TABLET BY MOUTH EVERY DAY 90 tablet 1   carvedilol  (COREG ) 25 MG tablet TAKE 1 TABLET (25 MG TOTAL) BY MOUTH TWICE A DAY WITH MEALS 180 tablet 1    doxazosin  (CARDURA ) 4 MG tablet Take 1 tablet (4 mg total) by mouth at bedtime. 90 tablet 1   hyoscyamine (LEVSIN SL) 0.125 MG SL tablet Place under the tongue 2 (two) times daily as needed. (Patient not taking: Reported on 07/07/2024)     spironolactone  (ALDACTONE ) 25 MG tablet Take 1 tablet (25 mg total) by mouth daily. 90 tablet 3   triamcinolone  cream (KENALOG ) 0.5 % Apply 1 Application topically 3 (three) times daily. (Patient not taking: Reported on 08/07/2024) 30 g 2   VASCEPA  1 g capsule TAKE 2 CAPSULES BY MOUTH 2 TIMES DAILY. 360 capsule 1   warfarin (COUMADIN ) 5 MG tablet TAKE 1 1/2 TABLETS BY MOUTH DAILY EXCEPT TAKE 1 TABLET ON TUESDAY, THURSDAY AND SATURDAY OR AS DIRECTED BY CLINIC. 120 tablet 1   Allergies as of 08/07/2024 - Review Complete 08/07/2024  Allergen Reaction Noted   Olmesartan   05/26/2024   Mesalamine Rash 05/06/2009   Family History  Problem Relation Age of Onset   Hypertension Mother  Hyperlipidemia Mother    Hypertension Father    Hyperlipidemia Father    Heart disease Father    Stroke Other        GF, aunts    Hypertension Other        the whole family   Coronary artery disease Neg Hx    Diabetes Neg Hx    Colon cancer Neg Hx    Prostate cancer Neg Hx    Kidney failure Neg Hx    Social History   Socioeconomic History   Marital status: Single    Spouse name: Not on file   Number of children: Not on file   Years of education: Not on file   Highest education level: Not on file  Occupational History   Occupation: [employer]    Occupation: disablity  Tobacco Use   Smoking status: Former    Current packs/day: 0.00    Average packs/day: 0.3 packs/day for 9.0 years (2.3 ttl pk-yrs)    Types: Cigarettes    Start date: 05/08/2002    Quit date: 05/09/2011    Years since quitting: 13.2   Smokeless tobacco: Never  Vaping Use   Vaping status: Never Used  Substance and Sexual Activity   Alcohol use: No    Alcohol/week: 0.0 standard drinks of  alcohol   Drug use: Not Currently    Types: Marijuana   Sexual activity: Not Currently  Other Topics Concern   Not on file  Social History Narrative   Lives by himself/ Regular exercise- no    Social Drivers of Health   Tobacco Use: Medium Risk (08/07/2024)   Patient History    Smoking Tobacco Use: Former    Smokeless Tobacco Use: Never    Passive Exposure: Not on Actuary Strain: Low Risk (06/11/2023)   Overall Financial Resource Strain (CARDIA)    Difficulty of Paying Living Expenses: Not hard at all  Food Insecurity: No Food Insecurity (06/12/2024)   Epic    Worried About Radiation Protection Practitioner of Food in the Last Year: Never true    Ran Out of Food in the Last Year: Never true  Transportation Needs: No Transportation Needs (06/12/2024)   Epic    Lack of Transportation (Medical): No    Lack of Transportation (Non-Medical): No  Physical Activity: Sufficiently Active (06/12/2024)   Exercise Vital Sign    Days of Exercise per Week: 7 days    Minutes of Exercise per Session: 30 min  Stress: No Stress Concern Present (06/12/2024)   [employer] of Occupational Health - Occupational Stress Questionnaire    Feeling of Stress: Not at all  Social Connections: Moderately Integrated (06/12/2024)   Social Connection and Isolation Panel    Frequency of Communication with Friends and Family: More than three times a week    Frequency of Social Gatherings with Friends and Family: More than three times a week    Attends Religious Services: More than 4 times per year    Active Member of Clubs or Organizations: Yes    Attends Banker Meetings: More than 4 times per year    Marital Status: Never married  Intimate Partner Violence: Not At Risk (06/12/2024)   Epic    Fear of Current or Ex-Partner: No    Emotionally Abused: No    Physically Abused: No    Sexually Abused: No  Depression (PHQ2-9): Low Risk (06/12/2024)   Depression (PHQ2-9)    PHQ-2 Score: 1  Alcohol Screen:  Low Risk (  06/11/2023)   Alcohol Screen    Last Alcohol Screening Score (AUDIT): 0  Housing: Unknown (06/12/2024)   Epic    Unable to Pay for Housing in the Last Year: No    Number of Times Moved in the Last Year: Not on file    Homeless in the Last Year: No  Utilities: Not At Risk (06/12/2024)   Epic    Threatened with loss of utilities: No  Health Literacy: Adequate Health Literacy (06/12/2024)   B1300 Health Literacy    Frequency of need for help with medical instructions: Never   Review of Systems:  Review of Systems  Respiratory:  Positive for shortness of breath.   Cardiovascular:  Negative for chest pain.  Gastrointestinal:  Positive for melena. Negative for abdominal pain, nausea and vomiting.    OBJECTIVE:   Temp:  [97.7 F (36.5 C)-98.6 F (37 C)] 98.4 F (36.9 C) (01/02 1208) Pulse Rate:  [79-103] 79 (01/02 1208) Resp:  [16-20] 16 (01/02 1208) BP: (122-132)/(69-88) 131/69 (01/02 1208) SpO2:  [96 %-100 %] 100 % (01/02 1208)   Physical Exam Constitutional:      General: He is not in acute distress.    Appearance: He is not ill-appearing, toxic-appearing or diaphoretic.  Cardiovascular:     Rate and Rhythm: Normal rate and regular rhythm.  Pulmonary:     Effort: No respiratory distress.     Breath sounds: Normal breath sounds.  Abdominal:     General: A surgical scar is present. Bowel sounds are normal. There is no distension.     Palpations: Abdomen is soft.     Tenderness: There is no abdominal tenderness. There is no guarding.  Skin:    General: Skin is warm and dry.  Neurological:     Mental Status: He is alert.     Labs: Recent Labs    08/07/24 1352 08/08/24 1037  WBC 8.5 9.2  HGB 13.2 12.1*  HCT 39.5 35.4*  PLT 317 291   BMET Recent Labs    08/07/24 1352 08/08/24 1037  NA 138 136  K 4.2 4.1  CL 101 102  CO2 27 25  GLUCOSE 104* 106*  BUN 25* 36*  CREATININE 0.89 0.95  CALCIUM  9.4 9.0   LFT Recent Labs    08/07/24 1352  PROT 6.6   ALBUMIN  3.8  AST 17  ALT 27  ALKPHOS 87  BILITOT 0.9   PT/INR Recent Labs    08/08/24 1037  LABPROT 30.2*  INR 2.7*    Diagnostic imaging: DG Chest 2 View Result Date: 08/07/2024 CLINICAL DATA:  Shortness of breath. EXAM: DG CHEST 2V COMPARISON:  Chest radiograph dated 07/23/2024. FINDINGS: Moderate right pleural effusion similar or slightly increased since the prior radiograph. There is associated right lung base atelectasis. Pneumonia is not excluded. Follow-up to resolution recommended. No pneumothorax. Stable cardiac silhouette. No acute osseous pathology. IMPRESSION: Moderate right pleural effusion similar or slightly increased since the prior radiograph. Electronically Signed   By: Vanetta Chou M.D.   On: 08/07/2024 15:36   IMPRESSION: Melena Acute blood loss anemia Shortness of breath, contributed to by above as well as right sided pleural effusion noted on chest x-ray Crohn's disease status post partial colectomy with healthy-appearing end-to-end ileocolonic anastomosis on flexible sigmoidoscopy January 2025  -On no current therapy for Crohn's disease Antithrombin III  deficiency on Coumadin   PLAN: - Recommend EGD to further evaluate melena and anemia, not currently requiring supplemental oxygen, discussed procedure with patient including benefits, risks including  bleeding/infection/perforation/missed lesion/anesthesia, he verbalized understanding elected to proceed - Hold Coumadin , can start heparin  GGT, would hold heparin  6 hours prior to EGD (timing TBD) -Trend H/H, transfuse for hemoglobin less than 7 - IV PPI every 12 hours - Further recommendations to follow pending procedure tomorrow   LOS: 0 days   Estefana Keas, DO [facility]

## 2024-08-08 NOTE — Assessment & Plan Note (Signed)
 Continue statins, anticoagulation when GI bleed resolves

## 2024-08-08 NOTE — TOC CM/SW Note (Signed)
 TOC consult received for d/c planning needs. Follow-up to be completed with patient as appropriate.   Merilee Batty, MSN, RN Case Management [phone]

## 2024-08-08 NOTE — Assessment & Plan Note (Signed)
 Last A1c 6.2 months ago -Currently not on any diabetic medications Checking CBG q. ACHS with SSI coverage

## 2024-08-08 NOTE — Assessment & Plan Note (Deleted)
 t

## 2024-08-08 NOTE — ED Provider Notes (Signed)
 " [place] EMERGENCY DEPARTMENT AT [facility] Provider Note  CSN: [id number] Arrival date & time: 08/07/24 1344  Chief Complaint(s) Blood In Stools  HPI Rick Edwards is a 69 y.o. male with past medical history as below, significant for Antithrombin III  deficiency on Coumadin , CKD, Crohn's disease, prior GI bleed who presents to the ED with complaint of black-colored stool  Patient has noticed the stools been black over the past 2 days.  He is on Coumadin .  Last Coumadin  was this morning.  He also reports progressive shortness of breath over the past month w/ exertion  He was seen by PCP on 07/1224 with his difficulty breathing, pleural effusion.  He was put on steroids and given albuterol  inhaler which has not improved his symptoms.  He was also started on antibiotics at urgent care, symptoms have persisted, seems to be worsening.  He had prior GI bleed, follows with Dr. Saintclair in the office.  Had upper endoscopy performed on 5/25 with Dr. Burnette which showed small hiatal hernia, benign-appearing esophageal stenosis, submucosal papules in the stomach, intramural lesion in the prepyloric region of the stomach suspicious for lipoma.  Likely Schatzki ring    Past Medical History Past Medical History:  Diagnosis Date   Anemia    anemia thrombocytopenia, saw Hematology 2011, can not r/o myeloproliferative d/c   Antithrombin III  deficiency 05/16/2013   On coumadin  for this   Blood clot in vein    in right leg   Chronic kidney disease 05/27/2013   ACUTE RENAL FAILURE    Crohn's disease (HCC)    tx. Imuran    GI bleed 6/11   w/ normal EGD 6/11, 3 PRBCs    Hearing loss    wears hearing aid left ear; birthed with hearing loss   HOH (hard of hearing) 05/16/2013   Hypercalcemia    Hyperkalemia 05/27/2013   Hypertension    Ileostomy in place [facility]) 04-11-13   04-18-13 ileostomy to be taken down.   Perianal abscess 2001   s/p right hemicolectomy, and drainage of retroperitoneal  abcess 2003   Peripheral vascular disease    Transfusion history    last 8'13   Patient Active Problem List   Diagnosis Date Noted   Pleural effusion on right 07/10/2024   Bradycardia with 41-50 beats per minute 05/13/2024   LAFB (left anterior fascicular block) 05/13/2024   Chronic hypokalemia 10/26/2023   Right renal mass 08/24/2023   Microalbuminuria due to type 2 diabetes mellitus (HCC) 06/13/2023   Need for prophylactic vaccination and inoculation against varicella 06/12/2023   Tinea cruris 06/12/2023   BPH without obstruction/lower urinary tract symptoms 03/02/2021   Intrinsic eczema 93/79/7977   LSC (lichen simplex chronicus) 01/18/2021   Chronic gout due to renal impairment of multiple sites without tophus 06/01/2020   Elevated PSA 11/24/2019   Type II diabetes mellitus with manifestations (HCC) 08/17/2018   Atherosclerosis of aorta 09/26/2017   Drug-induced erectile dysfunction 07/19/2016   Pure hypertriglyceridemia 04/05/2016   Hyperlipidemia with target LDL less than 70 01/18/2016   Proteinuria 03/13/2014   OA (osteoarthritis) of knee 11/25/2013   PVD (peripheral vascular disease) 09/24/2013   Encounter for therapeutic drug monitoring 09/16/2013   Stage 3a chronic kidney disease (HCC) 05/26/2013   Antithrombin III  deficiency 05/16/2013   Long term (current) use of anticoagulants 11/27/2012   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 Authorizing Provider  albuterol  (VENTOLIN  HFA) 108 (90 Base) MCG/ACT inhaler Inhale 1-2 puffs into the lungs every 6 (six) hours as needed for wheezing or shortness of breath. 07/23/24   Reddick, Johnathan B, NP  allopurinol  (ZYLOPRIM ) 300 MG tablet TAKE 1 TABLET BY MOUTH EVERY DAY 07/21/24    Joshua Debby CROME, MD  amLODipine  (NORVASC ) 10 MG tablet TAKE 1 TABLET BY MOUTH EVERYDAY AT BEDTIME 07/21/24   Joshua Debby CROME, MD  amoxicillin -clavulanate (AUGMENTIN ) 875-125 MG tablet Take 1 tablet by mouth every 12 (twelve) hours. Patient not taking: Reported on 08/07/2024 07/19/24   Minnie Tinnie BRAVO, PA  atorvastatin  (LIPITOR) 40 MG tablet TAKE 1 TABLET BY MOUTH EVERY DAY 07/11/24   Joshua Debby CROME, MD  carvedilol  (COREG ) 25 MG tablet TAKE 1 TABLET (25 MG TOTAL) BY MOUTH TWICE A DAY WITH MEALS 01/22/24   Joshua Debby CROME, MD  doxazosin  (CARDURA ) 4 MG tablet Take 1 tablet (4 mg total) by mouth at bedtime. 05/26/24   Joshua Debby CROME, MD  hyoscyamine (LEVSIN SL) 0.125 MG SL tablet Place under the tongue 2 (two) times daily as needed. Patient not taking: Reported on 07/07/2024 02/25/20   [provider]  spironolactone  (ALDACTONE ) 25 MG tablet Take 1 tablet (25 mg total) by mouth daily. 10/26/23   Joshua Debby CROME, MD  triamcinolone  cream (KENALOG ) 0.5 % Apply 1 Application topically 3 (three) times daily. Patient not taking: Reported on 08/07/2024 06/12/23   Joshua Debby CROME, MD  VASCEPA  1 g capsule TAKE 2 CAPSULES BY MOUTH 2 TIMES DAILY. 11/05/23   Joshua Debby CROME, MD  warfarin (COUMADIN ) 5 MG tablet TAKE 1 1/2 TABLETS BY MOUTH DAILY EXCEPT TAKE 1 TABLET ON TUESDAY, THURSDAY AND SATURDAY OR AS DIRECTED BY CLINIC. 07/21/24   Joshua Debby CROME, MD                                                                                                                                    Past Surgical History Past Surgical History:  Procedure Laterality Date   Anal fistulotomy  2002   EMBOLECTOMY  03/12/2012   Procedure: EMBOLECTOMY;  Surgeon: Lonni GORMAN Blade, MD;  Location: [facility] OR;  Service: Vascular;  Laterality: Right;  Right popliteal embolectomy with vein patch angioplasty, right posterior tibial embolectomy with vein patch angioplasty    ESOPHAGOGASTRODUODENOSCOPY N/A 12/12/2023   Procedure: EGD  (ESOPHAGOGASTRODUODENOSCOPY);  Surgeon: Burnette Fallow, MD;  Location: [facility];  Service: Gastroenterology;  Laterality: N/A;   HEMICOLECTOMY  08/29/2001   perforated abscess   ILEOSTOMY CLOSURE N/A 04/18/2013   Procedure: ILEOSTOMY TAKEDOWN;  Surgeon: Morene ONEIDA Olives, MD;  Location: [facility] ORS;  Service: General;  Laterality: N/A;   INTRAOPERATIVE ARTERIOGRAM  03/12/2012   Procedure: INTRA OPERATIVE ARTERIOGRAM;  Surgeon: Lonni GORMAN Blade, MD;  Location: [facility] OR;  Service:  Vascular;  Laterality: Right;   PARTIAL COLECTOMY  03/11/2012   Procedure: PARTIAL COLECTOMY;  Surgeon: Morene ONEIDA Olives, MD;  Location: [facility] ORS;  Service: General;  Laterality: N/A;  subtotal colectomy transverse and left colon    TRANSMETATARSAL AMPUTATION  05/10/2012   right   UPPER ESOPHAGEAL ENDOSCOPIC ULTRASOUND (EUS) N/A 12/12/2023   Procedure: UPPER ESOPHAGEAL ENDOSCOPIC ULTRASOUND (EUS);  Surgeon: Burnette Fallow, MD;  Location: [facility];  Service: Gastroenterology;  Laterality: N/A;   Family History Family History  Problem Relation Age of Onset   Hypertension Mother    Hyperlipidemia Mother    Hypertension Father    Hyperlipidemia Father    Heart disease Father    Stroke Other        GF, aunts    Hypertension Other        the whole family   Coronary artery disease Neg Hx    Diabetes Neg Hx    Colon cancer Neg Hx    Prostate cancer Neg Hx    Kidney failure Neg Hx     Social History Social History[1] Allergies Olmesartan  and Mesalamine  Review of Systems A thorough review of systems was obtained and all systems are negative except as noted in the HPI and PMH.   Physical Exam Vital Signs  I have reviewed the triage vital signs BP 131/69 (BP Location: Right Arm)   Pulse 79   Temp 98.4 F (36.9 C) (Oral)   Resp 16   Ht 5' 9 (1.753 m)   Wt 92.5 kg   SpO2 100%   BMI 30.13 kg/m  Physical Exam Vitals and nursing note reviewed. Exam conducted with a chaperone present (sheby Horticulturist, [employer]).  Constitutional:      General: He is not in acute distress.    Appearance: Normal appearance. He is well-developed. He is not ill-appearing.  HENT:     Head: Normocephalic and atraumatic.     Right Ear: External ear normal.     Left Ear: External ear normal.     Nose: Nose normal.     Mouth/Throat:     Mouth: Mucous membranes are moist.  Eyes:     General: No scleral icterus.       Right eye: No discharge.        Left eye: No discharge.  Cardiovascular:     Rate and Rhythm: Normal rate.  Pulmonary:     Effort: Pulmonary effort is normal. No tachypnea or respiratory distress.     Breath sounds: No stridor.  Abdominal:     General: Abdomen is flat. There is no distension.     Palpations: Abdomen is soft.     Tenderness: There is no guarding.  Musculoskeletal:        General: No deformity.     Cervical back: No rigidity.  Skin:    General: Skin is warm and dry.     Coloration: Skin is not cyanotic, jaundiced or pale.  Neurological:     Mental Status: He is alert.  Psychiatric:        Speech: Speech normal.        Behavior: Behavior normal. Behavior is cooperative.     ED Results and Treatments Labs (all labs ordered are listed, but only abnormal results are displayed) Labs Reviewed  COMPREHENSIVE METABOLIC PANEL WITH GFR - Abnormal; Notable for the following components:      Result Value   Glucose, Bld 104 (*)    BUN 25 (*)  All other components within normal limits  PROTIME-INR - Abnormal; Notable for the following components:   Prothrombin Time 30.2 (*)    INR 2.7 (*)    All other components within normal limits  CBC - Abnormal; Notable for the following components:   RBC 3.85 (*)    Hemoglobin 12.1 (*)    HCT 35.4 (*)    All other components within normal limits  BASIC METABOLIC PANEL WITH GFR - Abnormal; Notable for the following components:   Glucose, Bld 106 (*)    BUN 36 (*)    All other components within normal limits  CBC WITH  DIFFERENTIAL/PLATELET  PRO BRAIN NATRIURETIC PEPTIDE  POC OCCULT BLOOD, ED  TYPE AND SCREEN  TROPONIN T, HIGH SENSITIVITY                                                                                                                          Radiology CT CHEST ABDOMEN PELVIS W CONTRAST Result Date: 08/08/2024 CLINICAL DATA:  Acute generalized abdominal pain. EXAM: CT CHEST, ABDOMEN, AND PELVIS WITH CONTRAST TECHNIQUE: Multidetector CT imaging of the chest, abdomen and pelvis was performed following the standard protocol during bolus administration of intravenous contrast. RADIATION DOSE REDUCTION: This exam was performed according to the departmental dose-optimization program which includes automated exposure control, adjustment of the mA and/or kV according to patient size and/or use of iterative reconstruction technique. CONTRAST:  75mL OMNIPAQUE  IOHEXOL  350 MG/ML SOLN COMPARISON:  July 15, 2024.  August 19, 2023. FINDINGS: CT CHEST FINDINGS Cardiovascular: No evidence of thoracic aortic dissection or aneurysm. Enlarged pulmonary artery suggesting pulmonary artery hypertension. Normal cardiac size. No pericardial effusion. Mediastinum/Nodes: No enlarged mediastinal, hilar, or axillary lymph nodes. Thyroid  gland, trachea, and esophagus demonstrate no significant findings. Lungs/Pleura: Moderate size right pleural effusion is noted with adjacent subsegmental atelectasis. Lobar atelectasis of right lower lobe is noted. No pneumothorax is noted. Stable 1.4 by 1.1 cm left upper lobe lesion with cystic and solid areas best seen on image number 33 of series 4. Musculoskeletal: No chest wall mass or suspicious bone lesions identified. CT ABDOMEN PELVIS FINDINGS Hepatobiliary: No cholelithiasis or biliary dilatation is noted. Stable hepatic cysts. Pancreas: Unremarkable. No pancreatic ductal dilatation or surrounding inflammatory changes. Spleen: Normal in size without focal abnormality. Adrenals/Urinary  Tract: Adrenal glands appear normal. Multiple bilateral renal cystic lesions are again noted, including 2.1 cm partially exophytic complex region arising from midpole of right kidney. This was described as possible renal cell carcinoma on prior MRI. No hydronephrosis or renal obstruction is noted. Urinary bladder is unremarkable. Stomach/Bowel: Stomach is unremarkable. Status post partial colectomy. There is no evidence of bowel obstruction or inflammation. Large left periumbilical hernia is noted which contains loops of small bowel, but does not result in obstruction. Vascular/Lymphatic: Aortic atherosclerosis. No enlarged abdominal or pelvic lymph nodes. Reproductive: Mild prostatic enlargement is noted. Other: No ascites is noted. Musculoskeletal: No acute or significant osseous findings. IMPRESSION: 1. Moderate size right pleural  effusion is noted with adjacent subsegmental atelectasis. Lobar atelectasis of right lower lobe is noted. 2. Stable 1.4 x 1.1 cm left upper lobe lesion with cystic and solid areas. This is concerning for possible malignancy. Consider one of the following in 3 months for both low-risk and high-risk individuals: (a) repeat chest CT, (b) follow-up PET-CT, or (c) tissue sampling. This recommendation follows the consensus statement: Guidelines for Management of Incidental Pulmonary Nodules Detected on CT Images: From the Fleischner Society 2017; Radiology 2017; 284:228-243. 3. Multiple bilateral renal cystic lesions are again noted, including 2.1 cm partially exophytic complex region arising from midpole of right kidney. This was described as possible renal cell carcinoma on prior MRI. Consultation with urology is recommended. 4. Large left periumbilical hernia is noted which contains loops of small bowel, but does not result in obstruction. 5. Mild prostatic enlargement. 6. Aortic atherosclerosis. Aortic Atherosclerosis (ICD10-I70.0). Electronically Signed   By: Lynwood Landy Raddle M.D.   On:  08/08/2024 16:00    Pertinent labs & imaging results that were available during my care of the patient were reviewed by me and considered in my medical decision making (see MDM for details).  Medications Ordered in ED Medications  pantoprazole  (PROTONIX ) injection 40 mg (has no administration in time range)  furosemide (LASIX) injection 20 mg (has no administration in time range)  pantoprazole  (PROTONIX ) injection 80 mg (80 mg Intravenous Given 08/08/24 1232)  iohexol  (OMNIPAQUE ) 350 MG/ML injection 75 mL (75 mLs Intravenous Contrast Given 08/08/24 1451)                                                                                                                                     Procedures Procedures  (including critical care time)  Medical Decision Making / ED Course    Medical Decision Making:    SHERVIN CYPERT is a 69 y.o. male with past medical history as below, significant for Antithrombin III  deficiency on Coumadin , CKD, Crohn's disease, prior GI bleed who presents to the ED with complaint of black-colored stool. The complaint involves an extensive differential diagnosis and also carries with it a high risk of complications and morbidity.  Serious etiology was considered. Ddx includes but is not limited to: Upper GI bleed, lower GI bleed, AVM, diverticular bleed, pleural effusion, CHF, ACS, pneumonia, bronchitis, etc.  Complete initial physical exam performed, notably the patient was in acute distress, sitting comfortably in stretcher.    Reviewed and confirmed nursing documentation for past medical history, family history, social history.  Vital signs reviewed.    Presumed upper GI bleed> - Melanotic stool for 2 days, he is anticoagulant warfarin, last warfarin dose was this morning while in the lobby - INR is 2.7 - Baseline hemoglobin appears to be around 14.  Yesterday hemoglobin was 13.2, today 12.1 - BUN is 25 creatinine 0.89 - Hold further doses of warfarin at this  time, start Protonix  - heme +  tive stool - Recommend observation for GI bleed in setting of anticoagulation, ongoing bleeding.  Patient/ family are agreeable - Dr Kriss consulted, plan EGD tomorrow   Exertional dyspnea Pleural effusion> - Pleural effusion noted outpatient imaging and on imaging obtained yesterday. - Prior 2D echo 8/13 with LVEF 60-65% - Will check BNP, CT chest. He is not hypoxic - BNP wnl, trop WNL, no hypoxia - CT chest concerning for possible neoplasm, stable nodule, also noted to have pleural effusion - Likely benefit from biopsy, echo, consider thoracentesis. Will give dose of lasix.   Renal lesion> - Patient with renal lesions on his CT, right midpole, concern for possible RCC.  Spoke w/ urology, recommend f/u office. F/w Dr Alvaro   Clinical Course as of 08/08/24 1622  Fri Aug 08, 2024  1250 Hemoccult +tive [SG]  1330 Spoke w/ Dr Kriss who will see in consult  [SG]    Clinical Course User Index [SG] Elnor Jayson LABOR, DO    Will admit                Additional history obtained: -Additional history obtained from family -External records from outside source obtained and reviewed including: Chart review including previous notes, labs, imaging, consultation notes including  Prior E urgent care documentation, prior ER evaluation   Lab Tests: -I ordered, reviewed, and interpreted labs.   The pertinent results include:   Labs Reviewed  COMPREHENSIVE METABOLIC PANEL WITH GFR - Abnormal; Notable for the following components:      Result Value   Glucose, Bld 104 (*)    BUN 25 (*)    All other components within normal limits  PROTIME-INR - Abnormal; Notable for the following components:   Prothrombin Time 30.2 (*)    INR 2.7 (*)    All other components within normal limits  CBC - Abnormal; Notable for the following components:   RBC 3.85 (*)    Hemoglobin 12.1 (*)    HCT 35.4 (*)    All other components within normal limits  BASIC  METABOLIC PANEL WITH GFR - Abnormal; Notable for the following components:   Glucose, Bld 106 (*)    BUN 36 (*)    All other components within normal limits  CBC WITH DIFFERENTIAL/PLATELET  PRO BRAIN NATRIURETIC PEPTIDE  POC OCCULT BLOOD, ED  TYPE AND SCREEN  TROPONIN T, HIGH SENSITIVITY    Notable for as above  EKG   EKG Interpretation Date/Time:  Thursday August 07 2024 14:02:49 EST Ventricular Rate:  85 PR Interval:  136 QRS Duration:  82 QT Interval:  358 QTC Calculation: 426 R Axis:   -23  Text Interpretation: Normal sinus rhythm Normal ECG When compared with ECG of 23-Jul-2024 16:38, PREVIOUS ECG IS PRESENT Confirmed by Lorette Mayo [phone]) on 08/08/2024 2:11:48 AM         Imaging Studies ordered: I ordered imaging studies including ET chest and pelvis, chest x-ray I independently visualized the following imaging with scope of interpretation limited to determining acute life threatening conditions related to emergency care; findings noted above I agree with the radiologist interpretation If any imaging was obtained with contrast I closely monitored patient for any possible adverse reaction a/w contrast administration in the emergency department   Medicines ordered and prescription drug management: Meds ordered this encounter  Medications   pantoprazole  (PROTONIX ) injection 80 mg   pantoprazole  (PROTONIX ) injection 40 mg   iohexol  (OMNIPAQUE ) 350 MG/ML injection 75 mL   furosemide (LASIX) injection 20 mg    -  I have reviewed the patients home medicines and have made adjustments as needed   Consultations Obtained: I requested consultation with the GI, urology, hospitalist,  and discussed lab and imaging findings as well as pertinent plan   Cardiac Monitoring: Continuous pulse oximetry interpreted by myself, 100% on RA.    Social Determinants of Health:  Diagnosis or treatment significantly limited by social determinants of health: former smoker and  obesity   Reevaluation: After the interventions noted above, I reevaluated the patient and found that they have improved  Co morbidities that complicate the patient evaluation  Past Medical History:  Diagnosis Date   Anemia    anemia thrombocytopenia, saw Hematology 2011, can not r/o myeloproliferative d/c   Antithrombin III  deficiency 05/16/2013   On coumadin  for this   Blood clot in vein    in right leg   Chronic kidney disease 05/27/2013   ACUTE RENAL FAILURE    Crohn's disease (HCC)    tx. Imuran    GI bleed 6/11   w/ normal EGD 6/11, 3 PRBCs    Hearing loss    wears hearing aid left ear; birthed with hearing loss   HOH (hard of hearing) 05/16/2013   Hypercalcemia    Hyperkalemia 05/27/2013   Hypertension    Ileostomy in place [facility]) 04-11-13   04-18-13 ileostomy to be taken down.   Perianal abscess 2001   s/p right hemicolectomy, and drainage of retroperitoneal abcess 2003   Peripheral vascular disease    Transfusion history    last 8'13      Dispostion: Disposition decision including need for hospitalization was considered, and patient admitted to the hospital.    Final Clinical Impression(s) / ED Diagnoses Final diagnoses:  Gastrointestinal hemorrhage, unspecified gastrointestinal hemorrhage type  Anticoagulated on Coumadin   Pleural effusion  Renal mass  Pulmonary nodule         [1]  Social History Tobacco Use   Smoking status: Former    Current packs/day: 0.00    Average packs/day: 0.3 packs/day for 9.0 years (2.3 ttl pk-yrs)    Types: Cigarettes    Start date: 05/08/2002    Quit date: 05/09/2011    Years since quitting: 13.2   Smokeless tobacco: Never  Vaping Use   Vaping status: Never Used  Substance Use Topics   Alcohol use: No    Alcohol/week: 0.0 standard drinks of alcohol   Drug use: Not Currently    Types: Marijuana     Elnor Jayson LABOR, DO 08/08/24 1622  "

## 2024-08-08 NOTE — H&P (Signed)
 " History and Physical   Patient: Rick Edwards                            PCP: Joshua Debby CROME, MD                    DOB: 1955/11/14            DOA: 08/07/2024 FMW:[id number]             DOS: 08/08/2024, 5:31 PM  Joshua Debby CROME, MD  Patient coming from:   HOME  I have personally reviewed patient's medical records, in electronic medical records, including:  [place], and care everywhere.    Chief Complaint:   Chief Complaint  Patient presents with   Blood In Stools    History of present illness:    Rick Edwards is a 69 year old male with extensive history of Antithrombin III  deficiency chronically on Coumadin , CKD, Crohn's disease, chronic anemia, hypercalcemic hyperkalemia HTN, ileostomy/takedown 2014, PVD,... Presented to ED with chief complaint of black-colored stool. Patient report noticed black tarry stool x 2 days.  And also has been developing some shortness of breath over past few weeks with exertion.  Which has not improved with the steroids and inhalers, and course of antibiotics.   ED Evaluation: Blood pressure (!) 141/84, pulse 98, temperature 98.2 F (36.8 C), temperature source Oral, resp. rate 20, height 5' 9 (1.753 m), weight 92.5 kg, SpO2 100%. LABs: Hemoglobin 12.1 (baseline around 14) BUN 36, creatinine 0.95, INR 2.7  CT abdomen/pelvis: IMPRESSION: - Revealed moderate size pleural effusion, lobar atelectasis of the right 1.4 x 1.1 cm left upper lobe lesion with cystic solid area concerning for possible malignancy -follow-up in 3 months, per radiology Multiple bilateral renal cysts, 1 including 2.1 cm right kidney possible RCC -Left umbilical hernia containing small bowel no obstruction,  Mild prostatic enlargement.  Aortic atherosclerosis.    Patient Denies having: Fever, Chills, Cough, SOB, Chest Pain, Abd pain, N/V/D, headache, dizziness, lightheadedness,  Dysuria, Joint pain, rash, open wounds     Review of Systems: As per HPI,  otherwise 10 point review of systems were negative.   ----------------------------------------------------------------------------------------------------------------------  Allergies[1]  Home MEDs:  Prior to Admission medications  Medication Sig Start Date End Date Taking? Authorizing Provider  albuterol  (VENTOLIN  HFA) 108 (90 Base) MCG/ACT inhaler Inhale 1-2 puffs into the lungs every 6 (six) hours as needed for wheezing or shortness of breath. 07/23/24   Reddick, Johnathan B, NP  allopurinol  (ZYLOPRIM ) 300 MG tablet TAKE 1 TABLET BY MOUTH EVERY DAY 07/21/24   Joshua Debby CROME, MD  amLODipine  (NORVASC ) 10 MG tablet TAKE 1 TABLET BY MOUTH EVERYDAY AT BEDTIME 07/21/24   Joshua Debby CROME, MD  amoxicillin -clavulanate (AUGMENTIN ) 875-125 MG tablet Take 1 tablet by mouth every 12 (twelve) hours. Patient not taking: Reported on 08/07/2024 07/19/24   Minnie Tinnie BRAVO, PA  atorvastatin  (LIPITOR) 40 MG tablet TAKE 1 TABLET BY MOUTH EVERY DAY 07/11/24   Joshua Debby CROME, MD  carvedilol  (COREG ) 25 MG tablet TAKE 1 TABLET (25 MG TOTAL) BY MOUTH TWICE A DAY WITH MEALS 01/22/24   Joshua Debby CROME, MD  doxazosin  (CARDURA ) 4 MG tablet Take 1 tablet (4 mg total) by mouth at bedtime. 05/26/24   Joshua Debby CROME, MD  hyoscyamine (LEVSIN SL) 0.125 MG SL tablet Place under the tongue 2 (two) times daily as needed. Patient not taking: Reported on 07/07/2024 02/25/20  [provider]  spironolactone  (ALDACTONE ) 25 MG tablet Take 1 tablet (25 mg total) by mouth daily. 10/26/23   Joshua Debby CROME, MD  triamcinolone  cream (KENALOG ) 0.5 % Apply 1 Application topically 3 (three) times daily. Patient not taking: Reported on 08/07/2024 06/12/23   Joshua Debby CROME, MD  VASCEPA  1 g capsule TAKE 2 CAPSULES BY MOUTH 2 TIMES DAILY. 11/05/23   Joshua Debby CROME, MD  warfarin (COUMADIN ) 5 MG tablet TAKE 1 1/2 TABLETS BY MOUTH DAILY EXCEPT TAKE 1 TABLET ON TUESDAY, THURSDAY AND SATURDAY OR AS DIRECTED BY CLINIC. 07/21/24   Joshua Debby CROME,  MD    PRN MEDs: acetaminophen  **OR** acetaminophen , hydrALAZINE , HYDROmorphone  (DILAUDID ) injection, ipratropium, ondansetron  **OR** ondansetron  (ZOFRAN ) IV, oxyCODONE , traZODone  Past Medical History:  Diagnosis Date   Anemia    anemia thrombocytopenia, saw Hematology 2011, can not r/o myeloproliferative d/c   Antithrombin III  deficiency 05/16/2013   On coumadin  for this   Blood clot in vein    in right leg   Chronic kidney disease 05/27/2013   ACUTE RENAL FAILURE    Crohn's disease (HCC)    tx. Imuran    GI bleed 6/11   w/ normal EGD 6/11, 3 PRBCs    Hearing loss    wears hearing aid left ear; birthed with hearing loss   HOH (hard of hearing) 05/16/2013   Hypercalcemia    Hyperkalemia 05/27/2013   Hypertension    Ileostomy in place [facility]) 04-11-13   04-18-13 ileostomy to be taken down.   Perianal abscess 2001   s/p right hemicolectomy, and drainage of retroperitoneal abcess 2003   Peripheral vascular disease    Transfusion history    last 8'13    Past Surgical History:  Procedure Laterality Date   Anal fistulotomy  2002   EMBOLECTOMY  03/12/2012   Procedure: EMBOLECTOMY;  Surgeon: Lonni GORMAN Blade, MD;  Location: [facility];  Service: Vascular;  Laterality: Right;  Right popliteal embolectomy with vein patch angioplasty, right posterior tibial embolectomy with vein patch angioplasty    ESOPHAGOGASTRODUODENOSCOPY N/A 12/12/2023   Procedure: EGD (ESOPHAGOGASTRODUODENOSCOPY);  Surgeon: Burnette Fallow, MD;  Location: [facility];  Service: Gastroenterology;  Laterality: N/A;   HEMICOLECTOMY  08/29/2001   perforated abscess   ILEOSTOMY CLOSURE N/A 04/18/2013   Procedure: ILEOSTOMY TAKEDOWN;  Surgeon: Morene ONEIDA Olives, MD;  Location: [facility]S;  Service: General;  Laterality: N/A;   INTRAOPERATIVE ARTERIOGRAM  03/12/2012   Procedure: INTRA OPERATIVE ARTERIOGRAM;  Surgeon: Lonni GORMAN Blade, MD;  Location: [facility] OR;  Service: Vascular;  Laterality: Right;   PARTIAL COLECTOMY   03/11/2012   Procedure: PARTIAL COLECTOMY;  Surgeon: Morene ONEIDA Olives, MD;  Location: [facility];  Service: General;  Laterality: N/A;  subtotal colectomy transverse and left colon    TRANSMETATARSAL AMPUTATION  05/10/2012   right   UPPER ESOPHAGEAL ENDOSCOPIC ULTRASOUND (EUS) N/A 12/12/2023   Procedure: UPPER ESOPHAGEAL ENDOSCOPIC ULTRASOUND (EUS);  Surgeon: Burnette Fallow, MD;  Location: [facility];  Service: Gastroenterology;  Laterality: N/A;     reports that he quit smoking about 13 years ago. His smoking use included cigarettes. He started smoking about 22 years ago. He has a 2.3 pack-year smoking history. He has never used smokeless tobacco. He reports that he does not currently use drugs after having used the following drugs: Marijuana. He reports that he does not drink alcohol.   Family History  Problem Relation Age of Onset   Hypertension Mother    Hyperlipidemia Mother    Hypertension  Father    Hyperlipidemia Father    Heart disease Father    Stroke Other        GF, aunts    Hypertension Other        the whole family   Coronary artery disease Neg Hx    Diabetes Neg Hx    Colon cancer Neg Hx    Prostate cancer Neg Hx    Kidney failure Neg Hx     Physical Exam:   Vitals:   08/08/24 0632 08/08/24 0824 08/08/24 1208 08/08/24 1620  BP: 124/86 123/79 131/69 (!) 141/84  Pulse: 98 (!) 103 79 98  Resp: 20 16 16 20   Temp: 97.7 F (36.5 C) 98.6 F (37 C) 98.4 F (36.9 C) 98.2 F (36.8 C)  TempSrc: Oral  Oral Oral  SpO2: 98% 100% 100% 100%  Weight:      Height:       Constitutional: NAD, calm, comfortable Eyes: PERRL, lids and conjunctivae normal ENMT: Mucous membranes are moist. Posterior pharynx clear of any exudate or lesions.Normal dentition.  Neck: normal, supple, no masses, no thyromegaly Respiratory: clear to auscultation bilaterally, no wheezing, no crackles. Normal respiratory effort. No accessory muscle use.  Cardiovascular: Regular rate and rhythm, no  murmurs / rubs / gallops. No extremity edema. 2+ pedal pulses. No carotid bruits.  Abdomen: no tenderness, no masses palpated. No hepatosplenomegaly. Bowel sounds positive.  Musculoskeletal: no clubbing / cyanosis. No joint deformity upper and lower extremities. Good ROM, no contractures. Normal muscle tone.  Neurologic: CN II-XII grossly intact. Sensation intact, DTR normal. Strength 5/5 in all 4.  Psychiatric: Normal judgment and insight. Alert and oriented x 3. Normal mood.  Skin: no rashes, lesions, ulcers. No induration   Labs on admission:    I have personally reviewed following labs and imaging studies  CBC: Recent Labs  Lab 08/07/24 1352 08/08/24 1037  WBC 8.5 9.2  NEUTROABS 5.9  --   HGB 13.2 12.1*  HCT 39.5 35.4*  MCV 92.1 91.9  PLT 317 291   Basic Metabolic Panel: Recent Labs  Lab 08/07/24 1352 08/08/24 1037  NA 138 136  K 4.2 4.1  CL 101 102  CO2 27 25  GLUCOSE 104* 106*  BUN 25* 36*  CREATININE 0.89 0.95  CALCIUM  9.4 9.0   GFR: Estimated Creatinine Clearance: 85.9 mL/min (by C-G formula based on SCr of 0.95 mg/dL). Liver Function Tests: Recent Labs  Lab 08/07/24 1352  AST 17  ALT 27  ALKPHOS 87  BILITOT 0.9  PROT 6.6  ALBUMIN  3.8   No results for input(s): LIPASE, AMYLASE in the last 168 hours. No results for input(s): AMMONIA in the last 168 hours. Coagulation Profile: Recent Labs  Lab 08/05/24 0000 08/08/24 1037  INR 3.3* 2.7*   Cardiac Enzymes: No results for input(s): CKTOTAL, CKMB, CKMBINDEX, TROPONINI in the last 168 hours. BNP (last 3 results) Recent Labs    08/08/24 1227  PROBNP <50.0    Urine analysis:    Component Value Date/Time   COLORURINE YELLOW 10/24/2023 1332   APPEARANCEUR Clear 01/16/2024 0000   LABSPEC <=1.005 (A) 10/24/2023 1332   PHURINE 6.0 10/24/2023 1332   GLUCOSEU Negative 01/16/2024 0000   GLUCOSEU NEGATIVE 10/24/2023 1332   HGBUR NEGATIVE 10/24/2023 1332   HGBUR negative 04/19/2010  1025   BILIRUBINUR Negative 01/16/2024 0000   KETONESUR NEGATIVE 10/24/2023 1332   PROTEINUR 2+ (A) 01/16/2024 0000   PROTEINUR NEGATIVE 05/28/2013 0957   UROBILINOGEN 0.2 10/24/2023 1332  NITRITE Positive (A) 01/16/2024 0000   NITRITE NEGATIVE 10/24/2023 1332   LEUKOCYTESUR Negative 01/16/2024 0000   LEUKOCYTESUR NEGATIVE 10/24/2023 1332    Last A1C:  Lab Results  Component Value Date   HGBA1C 6.2 05/13/2024     Radiologic Exams on Admission:   CT CHEST ABDOMEN PELVIS W CONTRAST Result Date: 08/08/2024 CLINICAL DATA:  Acute generalized abdominal pain. EXAM: CT CHEST, ABDOMEN, AND PELVIS WITH CONTRAST TECHNIQUE: Multidetector CT imaging of the chest, abdomen and pelvis was performed following the standard protocol during bolus administration of intravenous contrast. RADIATION DOSE REDUCTION: This exam was performed according to the departmental dose-optimization program which includes automated exposure control, adjustment of the mA and/or kV according to patient size and/or use of iterative reconstruction technique. CONTRAST:  75mL OMNIPAQUE  IOHEXOL  350 MG/ML SOLN COMPARISON:  July 15, 2024.  August 19, 2023. FINDINGS: CT CHEST FINDINGS Cardiovascular: No evidence of thoracic aortic dissection or aneurysm. Enlarged pulmonary artery suggesting pulmonary artery hypertension. Normal cardiac size. No pericardial effusion. Mediastinum/Nodes: No enlarged mediastinal, hilar, or axillary lymph nodes. Thyroid  gland, trachea, and esophagus demonstrate no significant findings. Lungs/Pleura: Moderate size right pleural effusion is noted with adjacent subsegmental atelectasis. Lobar atelectasis of right lower lobe is noted. No pneumothorax is noted. Stable 1.4 by 1.1 cm left upper lobe lesion with cystic and solid areas best seen on image number 33 of series 4. Musculoskeletal: No chest wall mass or suspicious bone lesions identified. CT ABDOMEN PELVIS FINDINGS Hepatobiliary: No cholelithiasis or  biliary dilatation is noted. Stable hepatic cysts. Pancreas: Unremarkable. No pancreatic ductal dilatation or surrounding inflammatory changes. Spleen: Normal in size without focal abnormality. Adrenals/Urinary Tract: Adrenal glands appear normal. Multiple bilateral renal cystic lesions are again noted, including 2.1 cm partially exophytic complex region arising from midpole of right kidney. This was described as possible renal cell carcinoma on prior MRI. No hydronephrosis or renal obstruction is noted. Urinary bladder is unremarkable. Stomach/Bowel: Stomach is unremarkable. Status post partial colectomy. There is no evidence of bowel obstruction or inflammation. Large left periumbilical hernia is noted which contains loops of small bowel, but does not result in obstruction. Vascular/Lymphatic: Aortic atherosclerosis. No enlarged abdominal or pelvic lymph nodes. Reproductive: Mild prostatic enlargement is noted. Other: No ascites is noted. Musculoskeletal: No acute or significant osseous findings. IMPRESSION: 1. Moderate size right pleural effusion is noted with adjacent subsegmental atelectasis. Lobar atelectasis of right lower lobe is noted. 2. Stable 1.4 x 1.1 cm left upper lobe lesion with cystic and solid areas. This is concerning for possible malignancy. Consider one of the following in 3 months for both low-risk and high-risk individuals: (a) repeat chest CT, (b) follow-up PET-CT, or (c) tissue sampling. This recommendation follows the consensus statement: Guidelines for Management of Incidental Pulmonary Nodules Detected on CT Images: From the Fleischner Society 2017; Radiology 2017; 284:228-243. 3. Multiple bilateral renal cystic lesions are again noted, including 2.1 cm partially exophytic complex region arising from midpole of right kidney. This was described as possible renal cell carcinoma on prior MRI. Consultation with urology is recommended. 4. Large left periumbilical hernia is noted which contains  loops of small bowel, but does not result in obstruction. 5. Mild prostatic enlargement. 6. Aortic atherosclerosis. Aortic Atherosclerosis (ICD10-I70.0). Electronically Signed   By: Lynwood Landy Raddle M.D.   On: 08/08/2024 16:00   DG Chest 2 View Result Date: 08/07/2024 CLINICAL DATA:  Shortness of breath. EXAM: DG CHEST 2V COMPARISON:  Chest radiograph dated 07/23/2024. FINDINGS: Moderate right pleural effusion similar or  slightly increased since the prior radiograph. There is associated right lung base atelectasis. Pneumonia is not excluded. Follow-up to resolution recommended. No pneumothorax. Stable cardiac silhouette. No acute osseous pathology. IMPRESSION: Moderate right pleural effusion similar or slightly increased since the prior radiograph. Electronically Signed   By: Vanetta Chou M.D.   On: 08/07/2024 15:36    EKG:   Independently reviewed.  Orders placed or performed during the hospital encounter of 08/07/24   ED EKG   ED EKG   EKG 12-Lead   EKG 12-Lead   EKG   EKG   EKG 12-Lead   ---------------------------------------------------------------------------------------------------------------------------------------    Assessment / Plan:   Principal Problem:   GIB (gastrointestinal bleeding) Active Problems:   Antithrombin III  deficiency   Hyperlipidemia with target LDL less than 70   Pleural effusion on right   CROHN'S DISEASE-LARGE INTESTINE   Stage 3a chronic kidney disease (HCC)   Type II diabetes mellitus with manifestations (HCC)   Right renal mass   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 after midnight -40 mg IV Protonix  twice daily  Per GI: Patient has a previous GI bleed,  colostomy in which was taken down on 2014.  Primary gastroenterologist Dr. Herschell had an upper endoscopy on 5/25 with Dr. Alana which showed a small hiatal hernia, benign-appearing esophageal GL stenosis, submucosal papules and stomach, intramural lesions and a pyloric lesion of the stomach suspicious for lipoma  Pleural effusion on right History of pleural effusion Evidence again on CT scan -Likely contributing to shortness of breath - Will consult IR for possible thoracentesis Will follow with the labs  Hyperlipidemia with target LDL less than 70 Continue statins  Antithrombin III  deficiency Chronically anticoagulated on Coumadin  with INR 2.7 -Holding Coumadin  for now due to GI bleed -As patient is not actively bleeding, not reversing INR at this poin  Right renal mass Multiple bilateral renal cysts, 1 including 2.1 cm right kidney possible RCC On a CT scan -EDP Dr. Elnor has discussed the findings with on-call urologist Does not recommend any inpatient further workup -to follow-up as an outpatient  Type II diabetes mellitus with manifestations (HCC) Last A1c 6.2 months ago -Currently not on any diabetic medications Checking CBG q. 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 Level of care: Telemetry   Family Communication:  none at bedside  (The above findings and plan of care has been discussed with patient in detail, the patient expressed understanding and agreement of above plan)  --------------------------------------------------------------------------------------------------------------------------------------------------  Disposition Plan:  Anticipated 1-2 days Status is: Observation The patient remains OBS appropriate and will d/c before 2 midnights.     ----------------------------------------------------------------------------------------------------------------------------------------------------  Time  spent:  72  Min.  Was spent seeing and evaluating the patient, reviewing all medical records, drawn plan of care.  SIGNED: Adriana DELENA Grams, MD, FHM. FAAFP. [facility], Pager  (Please use [email or web address] to page/ or secure chat through epic) If 7PM-7AM, please contact night-coverage [email or web address],  08/08/2024, 5:31 PM     [1]  Allergies Allergen Reactions   Olmesartan      Hx of severe AKI requiring HD on ACE per renal note, hx of AKI and hypotension with olmesartan  07/2023   Mesalamine Rash    REACTION: Rash   "

## 2024-08-09 ENCOUNTER — Observation Stay (HOSPITAL_COMMUNITY)

## 2024-08-09 DIAGNOSIS — I10 Essential (primary) hypertension: Secondary | ICD-10-CM | POA: Diagnosis not present

## 2024-08-09 DIAGNOSIS — N1831 Chronic kidney disease, stage 3a: Secondary | ICD-10-CM | POA: Diagnosis present

## 2024-08-09 DIAGNOSIS — Z8249 Family history of ischemic heart disease and other diseases of the circulatory system: Secondary | ICD-10-CM | POA: Diagnosis not present

## 2024-08-09 DIAGNOSIS — C7801 Secondary malignant neoplasm of right lung: Secondary | ICD-10-CM | POA: Diagnosis present

## 2024-08-09 DIAGNOSIS — I743 Embolism and thrombosis of arteries of the lower extremities: Secondary | ICD-10-CM | POA: Diagnosis not present

## 2024-08-09 DIAGNOSIS — E876 Hypokalemia: Secondary | ICD-10-CM | POA: Diagnosis present

## 2024-08-09 DIAGNOSIS — R591 Generalized enlarged lymph nodes: Secondary | ICD-10-CM | POA: Diagnosis present

## 2024-08-09 DIAGNOSIS — Z79899 Other long term (current) drug therapy: Secondary | ICD-10-CM | POA: Diagnosis not present

## 2024-08-09 DIAGNOSIS — J910 Malignant pleural effusion: Secondary | ICD-10-CM | POA: Diagnosis present

## 2024-08-09 DIAGNOSIS — R042 Hemoptysis: Secondary | ICD-10-CM | POA: Diagnosis not present

## 2024-08-09 DIAGNOSIS — Z87891 Personal history of nicotine dependence: Secondary | ICD-10-CM | POA: Diagnosis not present

## 2024-08-09 DIAGNOSIS — I739 Peripheral vascular disease, unspecified: Secondary | ICD-10-CM | POA: Diagnosis not present

## 2024-08-09 DIAGNOSIS — E119 Type 2 diabetes mellitus without complications: Secondary | ICD-10-CM | POA: Diagnosis not present

## 2024-08-09 DIAGNOSIS — K921 Melena: Secondary | ICD-10-CM | POA: Diagnosis present

## 2024-08-09 DIAGNOSIS — E871 Hypo-osmolality and hyponatremia: Secondary | ICD-10-CM | POA: Diagnosis present

## 2024-08-09 DIAGNOSIS — Z7901 Long term (current) use of anticoagulants: Secondary | ICD-10-CM | POA: Diagnosis not present

## 2024-08-09 DIAGNOSIS — C269 Malignant neoplasm of ill-defined sites within the digestive system: Secondary | ICD-10-CM | POA: Diagnosis present

## 2024-08-09 DIAGNOSIS — E118 Type 2 diabetes mellitus with unspecified complications: Secondary | ICD-10-CM | POA: Diagnosis not present

## 2024-08-09 DIAGNOSIS — E1151 Type 2 diabetes mellitus with diabetic peripheral angiopathy without gangrene: Secondary | ICD-10-CM | POA: Diagnosis present

## 2024-08-09 DIAGNOSIS — K922 Gastrointestinal hemorrhage, unspecified: Secondary | ICD-10-CM | POA: Diagnosis not present

## 2024-08-09 DIAGNOSIS — R911 Solitary pulmonary nodule: Secondary | ICD-10-CM | POA: Diagnosis present

## 2024-08-09 DIAGNOSIS — K5010 Crohn's disease of large intestine without complications: Secondary | ICD-10-CM | POA: Diagnosis present

## 2024-08-09 DIAGNOSIS — I129 Hypertensive chronic kidney disease with stage 1 through stage 4 chronic kidney disease, or unspecified chronic kidney disease: Secondary | ICD-10-CM | POA: Diagnosis present

## 2024-08-09 DIAGNOSIS — N2889 Other specified disorders of kidney and ureter: Secondary | ICD-10-CM | POA: Diagnosis present

## 2024-08-09 DIAGNOSIS — E8809 Other disorders of plasma-protein metabolism, not elsewhere classified: Secondary | ICD-10-CM | POA: Diagnosis present

## 2024-08-09 DIAGNOSIS — K269 Duodenal ulcer, unspecified as acute or chronic, without hemorrhage or perforation: Secondary | ICD-10-CM | POA: Diagnosis not present

## 2024-08-09 DIAGNOSIS — F32A Depression, unspecified: Secondary | ICD-10-CM | POA: Diagnosis present

## 2024-08-09 DIAGNOSIS — E785 Hyperlipidemia, unspecified: Secondary | ICD-10-CM | POA: Diagnosis present

## 2024-08-09 DIAGNOSIS — D62 Acute posthemorrhagic anemia: Secondary | ICD-10-CM | POA: Diagnosis present

## 2024-08-09 DIAGNOSIS — C801 Malignant (primary) neoplasm, unspecified: Secondary | ICD-10-CM | POA: Diagnosis not present

## 2024-08-09 DIAGNOSIS — D6859 Other primary thrombophilia: Secondary | ICD-10-CM | POA: Diagnosis present

## 2024-08-09 DIAGNOSIS — J90 Pleural effusion, not elsewhere classified: Secondary | ICD-10-CM | POA: Diagnosis not present

## 2024-08-09 DIAGNOSIS — E1122 Type 2 diabetes mellitus with diabetic chronic kidney disease: Secondary | ICD-10-CM | POA: Diagnosis present

## 2024-08-09 DIAGNOSIS — K264 Chronic or unspecified duodenal ulcer with hemorrhage: Secondary | ICD-10-CM | POA: Diagnosis present

## 2024-08-09 LAB — PROTEIN, PLEURAL OR PERITONEAL FLUID: Total protein, fluid: 4.3 g/dL

## 2024-08-09 LAB — BODY FLUID CELL COUNT WITH DIFFERENTIAL
Eos, Fluid: 0 %
Lymphs, Fluid: 94 %
Monocyte-Macrophage-Serous Fluid: 2 % — ABNORMAL LOW (ref 50–90)
Neutrophil Count, Fluid: 4 % (ref 0–25)
Total Nucleated Cell Count, Fluid: 6743 uL — ABNORMAL HIGH (ref 0–1000)

## 2024-08-09 LAB — CBC
HCT: 32.2 % — ABNORMAL LOW (ref 39.0–52.0)
HCT: 31 % — ABNORMAL LOW (ref 39.0–52.0)
Hemoglobin: 11 g/dL — ABNORMAL LOW (ref 13.0–17.0)
Hemoglobin: 10.6 g/dL — ABNORMAL LOW (ref 13.0–17.0)
MCH: 31.1 pg (ref 26.0–34.0)
MCH: 31.4 pg (ref 26.0–34.0)
MCHC: 34.2 g/dL (ref 30.0–36.0)
MCHC: 34.2 g/dL (ref 30.0–36.0)
MCV: 91 fL (ref 80.0–100.0)
MCV: 91.7 fL (ref 80.0–100.0)
Platelets: 274 K/uL (ref 150–400)
Platelets: 290 K/uL (ref 150–400)
RBC: 3.54 MIL/uL — ABNORMAL LOW (ref 4.22–5.81)
RBC: 3.38 MIL/uL — ABNORMAL LOW (ref 4.22–5.81)
RDW: 13.7 % (ref 11.5–15.5)
RDW: 13.6 % (ref 11.5–15.5)
WBC: 8 K/uL (ref 4.0–10.5)
WBC: 8.7 K/uL (ref 4.0–10.5)
nRBC: 0 % (ref 0.0–0.2)
nRBC: 0 % (ref 0.0–0.2)

## 2024-08-09 LAB — APTT: aPTT: 41 s — ABNORMAL HIGH (ref 24–36)

## 2024-08-09 LAB — HIV ANTIBODY (ROUTINE TESTING W REFLEX): HIV Screen 4th Generation wRfx: NONREACTIVE

## 2024-08-09 LAB — BASIC METABOLIC PANEL WITH GFR
Anion gap: 9 (ref 5–15)
BUN: 23 mg/dL (ref 8–23)
CO2: 25 mmol/L (ref 22–32)
Calcium: 8.7 mg/dL — ABNORMAL LOW (ref 8.9–10.3)
Chloride: 104 mmol/L (ref 98–111)
Creatinine, Ser: 0.92 mg/dL (ref 0.61–1.24)
GFR, Estimated: >60 mL/min
Glucose, Bld: 105 mg/dL — ABNORMAL HIGH (ref 70–99)
Potassium: 4 mmol/L (ref 3.5–5.1)
Sodium: 139 mmol/L (ref 135–145)

## 2024-08-09 LAB — PHOSPHORUS: Phosphorus: 3.6 mg/dL (ref 2.5–4.6)

## 2024-08-09 LAB — LACTATE DEHYDROGENASE, PLEURAL OR PERITONEAL FLUID: LD, Fluid: 438 U/L

## 2024-08-09 LAB — GLUCOSE, CAPILLARY
Glucose-Capillary: 106 mg/dL — ABNORMAL HIGH (ref 70–99)
Glucose-Capillary: 148 mg/dL — ABNORMAL HIGH (ref 70–99)

## 2024-08-09 LAB — MAGNESIUM: Magnesium: 1.6 mg/dL — ABNORMAL LOW (ref 1.7–2.4)

## 2024-08-09 LAB — PROTIME-INR
INR: 3.3 — ABNORMAL HIGH (ref 0.8–1.2)
Prothrombin Time: 35.4 s — ABNORMAL HIGH (ref 11.4–15.2)

## 2024-08-09 MED ORDER — PHYTONADIONE 5 MG PO TABS
5.0000 mg | ORAL_TABLET | Freq: Once | ORAL | Status: AC
  Administered 2024-08-09: 5 mg via ORAL
  Filled 2024-08-09: qty 1

## 2024-08-09 MED ORDER — LIDOCAINE HCL 1 % IJ SOLN
INTRAMUSCULAR | Status: AC
  Filled 2024-08-09: qty 10

## 2024-08-09 MED ORDER — LIDOCAINE HCL (PF) 1 % IJ SOLN
10.0000 mL | Freq: Once | INTRAMUSCULAR | Status: DC
  Filled 2024-08-09: qty 10

## 2024-08-09 MED ORDER — LIDOCAINE HCL (PF) 1 % IJ SOLN
INTRAMUSCULAR | Status: AC
  Filled 2024-08-09: qty 5

## 2024-08-09 MED ORDER — LIDOCAINE HCL (PF) 1 % IJ SOLN
5.0000 mL | Freq: Once | INTRAMUSCULAR | Status: DC
  Filled 2024-08-09: qty 5

## 2024-08-09 NOTE — Evaluation (Signed)
 Physical Therapy Evaluation Patient Details Name: Rick Edwards MRN: [id number] DOB: 04-Dec-1955 Today's Date: 08/09/2024  History of Present Illness  69 year old male Presented to ED 08/07/24 with chief complaint of black-colored stool. +GIB, R pleural effusion, R renal mass (plan to follow-up as OP), 1/3 thoracentesis with vagal episode with BP 76/50. 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 PTA patient lives alone in a home with 4 steps to enter with one post to hold. He walks without a device and drives. Pt currently was limited by symptomatic orthostasis (see BPs below). He had to sit before the initial standing BP had even completed. Returned to supine with partial recovery of BP. RN and MD made aware.  Anticipate patient will benefit from PT to address problems listed below. Will continue to follow acutely to maximize functional mobility, independence, and safety.  Anticipate no PT needs if orthostasis clears.        08/09/24 1440  Vital Signs  Patient Position (if appropriate) Orthostatic Vitals  Orthostatic Lying   BP- Lying 126/74  Pulse- Lying 79  Orthostatic Sitting  BP- Sitting 128/70  Pulse- Sitting 88  Orthostatic Standing at 0 minutes  BP- Standing at 0 minutes (!) 67/37  Pulse- Standing at 0 minutes 87  Could not stand for 3 minutes due to dizziness.  Supine 91/64 HR 85      If plan is discharge home, recommend the following: Two people to help with walking and/or transfers;Assistance with cooking/housework;Assist for transportation;Help with stairs or ramp for entrance   Can travel by private vehicle        Equipment Recommendations None recommended by PT  Recommendations for Other Services       Functional Status Assessment Patient has had  a recent decline in their functional status and demonstrates the ability to make significant improvements in function in a reasonable and predictable amount of time.     Precautions / Restrictions Precautions Precautions: Fall;Other (comment) Recall of Precautions/Restrictions: Intact Precaution/Restrictions Comments: orthostasis      Mobility  Bed Mobility Overal bed mobility: Modified Independent             General bed mobility comments: HOB elevated, no rails    Transfers Overall transfer level: Needs assistance Equipment used: None Transfers: Sit to/from Stand Sit to Stand: Contact guard assist           General transfer comment: for safety due to earlier orthostasis (and again now)    Ambulation/Gait               General Gait Details: unable due to symptomatic orthostasis  Stairs            Wheelchair Mobility     Tilt Bed    Modified Rankin (Stroke Patients Only)       Balance Overall balance assessment: Independent (limited assessment)                                           Pertinent Vitals/Pain Pain Assessment Pain Assessment: No/denies pain    Home Living Family/patient expects to be discharged to:: Private residence Living Arrangements: Alone Available Help at Discharge: Family;Available 24 hours/day Type of Home: House Home Access: Stairs to enter Entrance Stairs-Rails:  Right (pole) Entrance Stairs-Number of Steps: 4   Home Layout: One level Home Equipment: Agricultural Consultant (2 wheels)      Prior Function Prior Level of Function : Independent/Modified Independent;Driving                     Extremity/Trunk Assessment   Upper Extremity Assessment Upper Extremity Assessment: Overall WFL for tasks assessed    Lower Extremity Assessment Lower Extremity Assessment: Overall WFL for tasks assessed    Cervical / Trunk Assessment Cervical / Trunk Assessment: Normal  Communication    Communication Communication: Impaired Factors Affecting Communication: Hearing impaired    Cognition Arousal: Alert Behavior During Therapy: WFL for tasks assessed/performed   PT - Cognitive impairments: No apparent impairments                         Following commands: Intact       Cueing Cueing Techniques: Verbal cues     General Comments General comments (skin integrity, edema, etc.): Brother present and answers some questions for pt (due to pt's hearing loss)    Exercises     Assessment/Plan    PT Assessment Patient needs continued PT services  PT Problem List Decreased activity tolerance;Decreased mobility;Decreased knowledge of use of DME;Cardiopulmonary status limiting activity       PT Treatment Interventions DME instruction;Gait training;Stair training;Functional mobility training;Therapeutic activities;Therapeutic exercise;Patient/family education    PT Goals (Current goals can be found in the Care Plan section)  Acute Rehab PT Goals Patient Stated Goal: be able to stand without getting dizzy PT Goal Formulation: With patient Time For Goal Achievement: 08/23/24 Potential to Achieve Goals: Good    Frequency Min 2X/week     Co-evaluation               AM-PAC PT 6 Clicks Mobility  Outcome Measure Help needed turning from your back to your side while in a flat bed without using bedrails?: None Help needed moving from lying on your back to sitting on the side of a flat bed without using bedrails?: None Help needed moving to and from a bed to a chair (including a wheelchair)?: A Little Help needed standing up from a chair using your arms (e.g., wheelchair or bedside chair)?: A Little Help needed to walk in hospital room?: Total Help needed climbing 3-5 steps with a railing? : Total 6 Click Score: 16    End of Session   Activity Tolerance: Treatment limited secondary to medical complications (Comment) (symptomatic orthostasis) Patient  left: in bed;with call bell/phone within reach;with bed alarm set;with family/visitor present Nurse Communication: Mobility status;Other (comment) (orthostasis; not able to walk) PT Visit Diagnosis: Difficulty in walking, not elsewhere classified (R26.2);Dizziness and giddiness (R42)    Time: 8588-8574 PT Time Calculation (min) (ACUTE ONLY): 14 min   Charges:   PT Evaluation $PT Eval Low Complexity: 1 Low   PT General Charges $$ ACUTE PT VISIT: 1 Visit          Macario RAMAN, PT Acute Rehabilitation Services  Office [phone]   Macario SHAUNNA Soja 08/09/2024, 2:42 PM

## 2024-08-09 NOTE — Progress Notes (Signed)
 " PROGRESS NOTE    Rick Edwards  FMW:[id number] DOB: [birth year] DOA: 08/07/2024 PCP: Joshua Debby CROME, MD   Brief Narrative:  Rick Edwards is a 69 year old male with extensive history of Antithrombin III  deficiency chronically on Coumadin , CKD, Crohn's disease, chronic anemia, hypercalcemic hyperkalemia HTN, ileostomy/takedown 2014, PVD,... Presented to ED with chief complaint of black-colored stool and shortness of breath and was found to have moderate size right pleural effusion and possible GI bleed.  GI consulted.  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  FOBT ordered but is still pending.  Seen by GI, plan for EGD today canceled due to elevated INR, rescheduled for Monday.  Continue Protonix  twice daily, monitor CBC every 12 hours and transfuse as indicated.  Moderate right pleural effusion: This is recurrent issue for him.  IR has been consulted for therapeutic and diagnostic paracentesis.  Antithrombin 3 deficiency/supratherapeutic INR: Chronically anticoagulated on Coumadin , INR 2.7 upon arrival.  Coumadin  on hold due to GI bleed.  No INR reversal agents were given to the patient.  INR worse today to 3.3.  Will start on vitamin K .  Repeat INR in the morning.  Right renal mass: Multiple bilateral renal cysts, 1 including 2.1 cm right kidney possible RCC On a CT scan-EDP Dr. Elnor has discussed the findings with on-call urologist Does not recommend  any inpatient further workup -to follow-up as an outpatient.  Type 2 diabetes mellitus: Last hemoglobin A1c 6.2, currently not on any medications.  Stage III CKD: At baseline.  History of chron's disease: Stable.  PVD: Continue statins.  Dyslipidemia: Resume statin.  DVT prophylaxis: SCDs Start: 08/08/24 1636 Place TED hose Start: 08/08/24 1636   Code Status: Full Code  Family Communication: Multiple family members present at bedside.  Plan of care discussed with patient in length and he/she verbalized understanding and agreed with it.  Status is: Observation The patient will require care spanning > 2 midnights and should be moved to inpatient because: Patient needs to remain in the hospital for EGD scheduled for Monday.   Estimated body mass index is 29.04 kg/m as calculated from the following:   Height as of this encounter: 5' 9 (1.753 m).   Weight as of this encounter: 89.2 kg.    Nutritional Assessment: Body mass index is 29.04 kg/m.[name] Seen by dietician.  I agree with the assessment and plan as outlined below: Nutrition Status:        . Skin Assessment: I have examined the patient's skin and I agree with the wound assessment as performed by the wound care RN as outlined below:    Consultants:  GI  Procedures:  None none  Antimicrobials:  Anti-infectives (From admission, onward)    None         Subjective: Patient seen and examined, he has no complaints.  He has not had any bowel movement.  Denied any abdominal pain.  Multiple family members at the bedside.  Objective: Vitals:  08/08/24 1857 08/08/24 1927 08/09/24 0500 08/09/24 0750  BP: (!) 140/92 123/85 127/73 120/77  Pulse:  90 78 77  Resp: 20 19 17 20   Temp: 98.1 F (36.7 C) 97.8 F (36.6 C) 98 F (36.7 C) 98.5 F (36.9 C)  TempSrc: Oral Oral Oral Oral  SpO2: 97% 97% 97% 99%  Weight:   89.2 kg   Height:        Intake/Output Summary (Last 24 hours) at 08/09/2024 0756 Last data filed at  08/09/2024 0500 Gross per 24 hour  Intake 272.07 ml  Output 1150 ml  Net -877.93 ml   Filed Weights   08/07/24 1350 08/09/24 0500  Weight: 92.5 kg 89.2 kg    Examination:  General exam: Appears calm and comfortable  Respiratory system: Clear to auscultation. Respiratory effort normal. Cardiovascular system: S1 & S2 heard, RRR. No JVD, murmurs, rubs, gallops or clicks. No pedal edema. Gastrointestinal system: Abdomen is nondistended, soft and nontender. No organomegaly, normal bowel sounds.  Large left periumbilical hernia which is reducible. Central nervous system: Alert and oriented. No focal neurological deficits. Extremities: Symmetric 5 x 5 power. Skin: No rashes, lesions or ulcers Psychiatry: Judgement and insight appear normal. Mood & affect appropriate.    Data Reviewed: I have personally reviewed following labs and imaging studies  CBC: Recent Labs  Lab 08/07/24 1352 08/08/24 1037  WBC 8.5 9.2  NEUTROABS 5.9  --   HGB 13.2 12.1*  HCT 39.5 35.4*  MCV 92.1 91.9  PLT 317 291   Basic Metabolic Panel: Recent Labs  Lab 08/07/24 1352 08/08/24 1037  NA 138 136  K 4.2 4.1  CL 101 102  CO2 27 25  GLUCOSE 104* 106*  BUN 25* 36*  CREATININE 0.89 0.95  CALCIUM  9.4 9.0   GFR: Estimated Creatinine Clearance: 84.5 mL/min (by C-G formula based on SCr of 0.95 mg/dL). Liver Function Tests: Recent Labs  Lab 08/07/24 1352  AST 17  ALT 27  ALKPHOS 87  BILITOT 0.9  PROT 6.6  ALBUMIN  3.8   No results for input(s): LIPASE, AMYLASE in the last 168 hours. No results for input(s): AMMONIA in the last 168 hours. Coagulation Profile: Recent Labs  Lab 08/05/24 0000 08/08/24 1037  INR 3.3* 2.7*   Cardiac Enzymes: No results for input(s): CKTOTAL, CKMB, CKMBINDEX, TROPONINI in the last 168 hours. BNP (last 3 results) Recent Labs    08/08/24 1227  PROBNP <50.0   HbA1C: No results for input(s): HGBA1C in the last 72 hours. CBG: Recent Labs  Lab  08/09/24 0740  GLUCAP 106*   Lipid Profile: No results for input(s): CHOL, HDL, LDLCALC, TRIG, CHOLHDL, LDLDIRECT in the last 72 hours. Thyroid  Function Tests: No results for input(s): TSH, T4TOTAL, FREET4, T3FREE, THYROIDAB in the last 72 hours. Anemia Panel: No results for input(s): VITAMINB12, FOLATE, FERRITIN, TIBC, IRON, RETICCTPCT in the last 72 hours. Sepsis Labs: No results for input(s): PROCALCITON, LATICACIDVEN in the last 168 hours.  No results found for this or any previous visit (from the past 240 hours).   Radiology Studies: CT CHEST ABDOMEN PELVIS W CONTRAST Result Date: 08/08/2024 CLINICAL DATA:  Acute generalized abdominal pain. EXAM: CT CHEST, ABDOMEN, AND PELVIS WITH CONTRAST TECHNIQUE: Multidetector CT imaging of the chest, abdomen and pelvis was performed following the standard protocol during bolus administration of intravenous contrast. RADIATION DOSE REDUCTION: This exam was performed according to the departmental dose-optimization program which includes automated exposure control, adjustment of the mA and/or kV according to patient size  and/or use of iterative reconstruction technique. CONTRAST:  75mL OMNIPAQUE  IOHEXOL  350 MG/ML SOLN COMPARISON:  July 15, 2024.  August 19, 2023. FINDINGS: CT CHEST FINDINGS Cardiovascular: No evidence of thoracic aortic dissection or aneurysm. Enlarged pulmonary artery suggesting pulmonary artery hypertension. Normal cardiac size. No pericardial effusion. Mediastinum/Nodes: No enlarged mediastinal, hilar, or axillary lymph nodes. Thyroid  gland, trachea, and esophagus demonstrate no significant findings. Lungs/Pleura: Moderate size right pleural effusion is noted with adjacent subsegmental atelectasis. Lobar atelectasis of right lower lobe is noted. No pneumothorax is noted. Stable 1.4 by 1.1 cm left upper lobe lesion with cystic and solid areas best seen on image number 33 of series 4. Musculoskeletal:  No chest wall mass or suspicious bone lesions identified. CT ABDOMEN PELVIS FINDINGS Hepatobiliary: No cholelithiasis or biliary dilatation is noted. Stable hepatic cysts. Pancreas: Unremarkable. No pancreatic ductal dilatation or surrounding inflammatory changes. Spleen: Normal in size without focal abnormality. Adrenals/Urinary Tract: Adrenal glands appear normal. Multiple bilateral renal cystic lesions are again noted, including 2.1 cm partially exophytic complex region arising from midpole of right kidney. This was described as possible renal cell carcinoma on prior MRI. No hydronephrosis or renal obstruction is noted. Urinary bladder is unremarkable. Stomach/Bowel: Stomach is unremarkable. Status post partial colectomy. There is no evidence of bowel obstruction or inflammation. Large left periumbilical hernia is noted which contains loops of small bowel, but does not result in obstruction. Vascular/Lymphatic: Aortic atherosclerosis. No enlarged abdominal or pelvic lymph nodes. Reproductive: Mild prostatic enlargement is noted. Other: No ascites is noted. Musculoskeletal: No acute or significant osseous findings. IMPRESSION: 1. Moderate size right pleural effusion is noted with adjacent subsegmental atelectasis. Lobar atelectasis of right lower lobe is noted. 2. Stable 1.4 x 1.1 cm left upper lobe lesion with cystic and solid areas. This is concerning for possible malignancy. Consider one of the following in 3 months for both low-risk and high-risk individuals: (a) repeat chest CT, (b) follow-up PET-CT, or (c) tissue sampling. This recommendation follows the consensus statement: Guidelines for Management of Incidental Pulmonary Nodules Detected on CT Images: From the Fleischner Society 2017; Radiology 2017; 284:228-243. 3. Multiple bilateral renal cystic lesions are again noted, including 2.1 cm partially exophytic complex region arising from midpole of right kidney. This was described as possible renal cell  carcinoma on prior MRI. Consultation with urology is recommended. 4. Large left periumbilical hernia is noted which contains loops of small bowel, but does not result in obstruction. 5. Mild prostatic enlargement. 6. Aortic atherosclerosis. Aortic Atherosclerosis (ICD10-I70.0). Electronically Signed   By: Lynwood Landy Raddle M.D.   On: 08/08/2024 16:00   DG Chest 2 View Result Date: 08/07/2024 CLINICAL DATA:  Shortness of breath. EXAM: DG CHEST 2V COMPARISON:  Chest radiograph dated 07/23/2024. FINDINGS: Moderate right pleural effusion similar or slightly increased since the prior radiograph. There is associated right lung base atelectasis. Pneumonia is not excluded. Follow-up to resolution recommended. No pneumothorax. Stable cardiac silhouette. No acute osseous pathology. IMPRESSION: Moderate right pleural effusion similar or slightly increased since the prior radiograph. Electronically Signed   By: Vanetta Chou M.D.   On: 08/07/2024 15:36    Scheduled Meds:  allopurinol   300 mg Oral Daily   atorvastatin   40 mg Oral Daily   carvedilol   25 mg Oral BID WC   doxazosin   4 mg Oral QHS   melatonin  5 mg Oral QHS   pantoprazole  (PROTONIX ) IV  40 mg Intravenous Q12H   senna-docusate  1 tablet Oral BID   sodium chloride   flush  3 mL Intravenous Q12H   sodium chloride  flush  3 mL Intravenous Q12H   spironolactone   25 mg Oral Daily   Continuous Infusions:  sodium chloride  50 mL/hr at 08/09/24 0334     LOS: 0 days   Fredia Skeeter, MD [facility]  08/09/2024, 7:56 AM   *Please note that this is a verbal dictation therefore any spelling or grammatical errors are due to the Dragon Medical One system interpretation.  Please page via Amion and do not message via secure chat for urgent patient care matters. Secure chat can be used for non urgent patient care matters.  How to contact the [facility] Attending or Consulting provider 7A - 7P or covering provider during after hours 7P -7A, for this patient?   Check the care team in [facility] and look for a) attending/consulting [facility] provider listed and b) the [facility] team listed. Page or secure chat 7A-7P. Log into [email or web address] and use [facility] universal password to access. If you do not have the password, please contact the hospital operator. Locate the [facility] provider you are looking for under [facility] and page to a number that you can be directly reached. If you still have difficulty reaching the provider, please page the [facility] (Director on Call) for the Hospitalists listed on amion for assistance.  "

## 2024-08-09 NOTE — Plan of Care (Signed)

## 2024-08-09 NOTE — H&P (View-Only) (Signed)
 [facility] Progress Note  Rick Edwards FrRjpw 69 y.o. [birth year]   Subjective: Denies abdominal pain. Multiple family members in room.  Objective: Vital signs: Vitals:   08/09/24 0930 08/09/24 1137  BP: 123/82 121/73  Pulse:  83  Resp:  20  Temp:  98.7 F (37.1 C)  SpO2:  98%    Physical Exam: Gen: lethargic, well-nourished, no acute distress  HEENT: anicteric sclera CV: RRR Chest: CTA B Abd: soft, nontender, nondistended, +BS Ext: no edema  Lab Results: Recent Labs    08/08/24 1037 08/09/24 0830  NA 136 139  K 4.1 4.0  CL 102 104  CO2 25 25  GLUCOSE 106* 105*  BUN 36* 23  CREATININE 0.95 0.92  CALCIUM  9.0 8.7*  MG  --  1.6*  PHOS  --  3.6   Recent Labs    08/07/24 1352  AST 17  ALT 27  ALKPHOS 87  BILITOT 0.9  PROT 6.6  ALBUMIN  3.8   Recent Labs    08/07/24 1352 08/08/24 1037 08/09/24 0830  WBC 8.5 9.2 8.0  NEUTROABS 5.9  --   --   HGB 13.2 12.1* 11.0*  HCT 39.5 35.4* 32.2*  MCV 92.1 91.9 91.0  PLT 317 291 274      Assessment/Plan: Melena and history of Crohn's - no sign of ongoing bleeding. Hgb 11. INR 3.3. EGD postponed due to supratherapeutic INR. Plan for EGD on 1/5 if INR less than 2. NPO p MN Sunday night. Hold Heparin  at 0330 on 1/5. Supportive care.    Rick Edwards Sol 08/09/2024, 12:47 PM  Questions please call [phone] ID: Rick Edwards, male   DOB: 12/02/1955, 69 y.o.   MRN: [id number]

## 2024-08-09 NOTE — OR Nursing (Signed)
 Per GI Doctor Dianna- INR lab elevated and  pt is rescheduled for Monday, primary RN notified and pt should be NPO Midnight Sunday. Rick Edwards

## 2024-08-09 NOTE — Procedures (Signed)
 PROCEDURE SUMMARY:  Successful image-guided diagnostic and therapeutic thoracentesis from the right chest.  Yielded 100 milliliters of amber fluid.  During procedure, patient experienced vagal response. BP dropped to 76/50 and patient reported feeling anxious and dizzy. HR 85, spO2 98% at that time. Catheter was removed and patient was assisted to lie down. After lying down, patient experienced relief and BP improved to 131/76.  EBL: zero  Specimen sent for labs.  Post-procedure CXR ordered and obtained prior to departure from department.   Please see imaging section of Epic for full dictation.  Dervin Vore NP 08/09/2024 9:27 AM

## 2024-08-09 NOTE — Progress Notes (Signed)
 [facility] Progress Note  Rick Edwards FrRjpw 69 y.o. [birth year]   Subjective: Denies abdominal pain. Multiple family members in room.  Objective: Vital signs: Vitals:   08/09/24 0930 08/09/24 1137  BP: 123/82 121/73  Pulse:  83  Resp:  20  Temp:  98.7 F (37.1 C)  SpO2:  98%    Physical Exam: Gen: lethargic, well-nourished, no acute distress  HEENT: anicteric sclera CV: RRR Chest: CTA B Abd: soft, nontender, nondistended, +BS Ext: no edema  Lab Results: Recent Labs    08/08/24 1037 08/09/24 0830  NA 136 139  K 4.1 4.0  CL 102 104  CO2 25 25  GLUCOSE 106* 105*  BUN 36* 23  CREATININE 0.95 0.92  CALCIUM  9.0 8.7*  MG  --  1.6*  PHOS  --  3.6   Recent Labs    08/07/24 1352  AST 17  ALT 27  ALKPHOS 87  BILITOT 0.9  PROT 6.6  ALBUMIN  3.8   Recent Labs    08/07/24 1352 08/08/24 1037 08/09/24 0830  WBC 8.5 9.2 8.0  NEUTROABS 5.9  --   --   HGB 13.2 12.1* 11.0*  HCT 39.5 35.4* 32.2*  MCV 92.1 91.9 91.0  PLT 317 291 274      Assessment/Plan: Melena and history of Crohn's - no sign of ongoing bleeding. Hgb 11. INR 3.3. EGD postponed due to supratherapeutic INR. Plan for EGD on 1/5 if INR less than 2. NPO p MN Sunday night. Hold Heparin  at 0330 on 1/5. Supportive care.    Rick Edwards Sol 08/09/2024, 12:47 PM  Questions please call [phone] ID: Rick Edwards, male   DOB: 12/02/1955, 69 y.o.   MRN: [id number]

## 2024-08-10 DIAGNOSIS — K922 Gastrointestinal hemorrhage, unspecified: Secondary | ICD-10-CM | POA: Diagnosis not present

## 2024-08-10 LAB — CBC
HCT: 30.1 % — ABNORMAL LOW (ref 39.0–52.0)
HCT: 32.1 % — ABNORMAL LOW (ref 39.0–52.0)
Hemoglobin: 10.3 g/dL — ABNORMAL LOW (ref 13.0–17.0)
Hemoglobin: 10.8 g/dL — ABNORMAL LOW (ref 13.0–17.0)
MCH: 30.9 pg (ref 26.0–34.0)
MCH: 31.2 pg (ref 26.0–34.0)
MCHC: 34.2 g/dL (ref 30.0–36.0)
MCHC: 33.6 g/dL (ref 30.0–36.0)
MCV: 90.4 fL (ref 80.0–100.0)
MCV: 92.8 fL (ref 80.0–100.0)
Platelets: 274 K/uL (ref 150–400)
Platelets: 307 K/uL (ref 150–400)
RBC: 3.33 MIL/uL — ABNORMAL LOW (ref 4.22–5.81)
RBC: 3.46 MIL/uL — ABNORMAL LOW (ref 4.22–5.81)
RDW: 13.7 % (ref 11.5–15.5)
RDW: 13.7 % (ref 11.5–15.5)
WBC: 7.9 K/uL (ref 4.0–10.5)
WBC: 9.2 K/uL (ref 4.0–10.5)
nRBC: 0 % (ref 0.0–0.2)
nRBC: 0 % (ref 0.0–0.2)

## 2024-08-10 LAB — BASIC METABOLIC PANEL WITH GFR
Anion gap: 9 (ref 5–15)
BUN: 16 mg/dL (ref 8–23)
CO2: 26 mmol/L (ref 22–32)
Calcium: 8.6 mg/dL — ABNORMAL LOW (ref 8.9–10.3)
Chloride: 105 mmol/L (ref 98–111)
Creatinine, Ser: 0.91 mg/dL (ref 0.61–1.24)
GFR, Estimated: >60 mL/min
Glucose, Bld: 107 mg/dL — ABNORMAL HIGH (ref 70–99)
Potassium: 4 mmol/L (ref 3.5–5.1)
Sodium: 140 mmol/L (ref 135–145)

## 2024-08-10 LAB — LACTATE DEHYDROGENASE: LDH: 247 U/L — ABNORMAL HIGH (ref 105–235)

## 2024-08-10 LAB — PROTIME-INR
INR: 1.6 — ABNORMAL HIGH (ref 0.8–1.2)
Prothrombin Time: 20.4 s — ABNORMAL HIGH (ref 11.4–15.2)

## 2024-08-10 LAB — HEPARIN LEVEL (UNFRACTIONATED): Heparin Unfractionated: 1.02 [IU]/mL — ABNORMAL HIGH (ref 0.30–0.70)

## 2024-08-10 MED ORDER — HEPARIN (PORCINE) 25000 UT/250ML-% IV SOLN
1450.0000 [IU]/h | INTRAVENOUS | Status: DC
  Administered 2024-08-10: 1450 [IU]/h via INTRAVENOUS
  Filled 2024-08-10: qty 250

## 2024-08-10 MED ORDER — HEPARIN (PORCINE) 25000 UT/250ML-% IV SOLN
1300.0000 [IU]/h | INTRAVENOUS | Status: DC
  Administered 2024-08-10: 1300 [IU]/h via INTRAVENOUS

## 2024-08-10 NOTE — Progress Notes (Addendum)
 PHARMACY - ANTICOAGULATION CONSULT NOTE  Pharmacy Consult for heparin  Indication: antithrombin III  deficiency and Hx DVT; holding home warfarin for procedure  Allergies[1]  Patient Measurements: Height: 5' 9 (175.3 cm) Weight: 87.6 kg (193 lb 2 oz) IBW/kg (Calculated) : 70.7 HEPARIN  DW (KG): 89.6  Vital Signs: Temp: 98.3 F (36.8 C) (01/04 0751) Temp Source: Oral (01/04 0751) BP: 123/92 (01/04 0751) Pulse Rate: 86 (01/04 0751)  Labs: Recent Labs    08/08/24 1037 08/09/24 0830 08/09/24 1751 08/10/24 0627  HGB 12.1* 11.0* 10.6* 10.3*  HCT 35.4* 32.2* 31.0* 30.1*  PLT 291 274 290 274  APTT  --  41*  --   --   LABPROT 30.2* 35.4*  --  20.4*  INR 2.7* 3.3*  --  1.6*  CREATININE 0.95 0.92  --  0.91    Estimated Creatinine Clearance: 87.5 mL/min (by C-G formula based on SCr of 0.91 mg/dL).   Medical History: Past Medical History:  Diagnosis Date   Anemia    anemia thrombocytopenia, saw Hematology 2011, can not r/o myeloproliferative d/c   Antithrombin III  deficiency 05/16/2013   On coumadin  for this   Blood clot in vein    in right leg   Chronic kidney disease 05/27/2013   ACUTE RENAL FAILURE    Crohn's disease (HCC)    tx. Imuran    GI bleed 6/11   w/ normal EGD 6/11, 3 PRBCs    Hearing loss    wears hearing aid left ear; birthed with hearing loss   HOH (hard of hearing) 05/16/2013   Hypercalcemia    Hyperkalemia 05/27/2013   Hypertension    Ileostomy in place [facility]) 04-11-13   04-18-13 ileostomy to be taken down.   Perianal abscess 2001   s/p right hemicolectomy, and drainage of retroperitoneal abcess 2003   Peripheral vascular disease    Transfusion history    last 8'13    Medications:  Scheduled:   allopurinol   300 mg Oral Daily   atorvastatin   40 mg Oral Daily   doxazosin   4 mg Oral QHS   lidocaine  (PF)  10 mL Intradermal Once   lidocaine  (PF)  5 mL Intradermal Once   melatonin  5 mg Oral QHS   pantoprazole  (PROTONIX ) IV  40 mg Intravenous Q12H    senna-docusate  1 tablet Oral BID   sodium chloride  flush  3 mL Intravenous Q12H   sodium chloride  flush  3 mL Intravenous Q12H   spironolactone   25 mg Oral Daily    Assessment: Patient is a 69 YO male admitted with melena/suspected GI bleeding. Patient on warfarin PTA. Pharmacy consulted to dose heparin  without bolus to bridge patient until 0330 tomorrow before EGD on 1/5 now that INR is < 2. Hgb stable in the 10s, plt WNL. Most recent outpatient regimen was warfarin 5mg  tablet, 1.5 tabs daily except 1 tablet on MWF per most recent anticoag note on 12/30. INR was 2.7 on presentation to hospital.  Goal of Therapy:  Heparin  level 0.3-0.7 units/ml Monitor platelets by anticoagulation protocol: Yes   Plan:  -Start heparin  infusion at 1450 units/h; stop date entered for 1/5 at 0330 -Check heparin  level in 6h -Monitor heparin  level, CBC daily  Maurilio Patten, PharmD PGY1 Pharmacy Resident [facility] 08/10/2024 8:17 AM     [1]  Allergies Allergen Reactions   Olmesartan      Hx of severe AKI requiring HD on ACE per renal note, hx of AKI and hypotension with olmesartan  07/2023   Mesalamine Rash  REACTION: Rash

## 2024-08-10 NOTE — Progress Notes (Signed)
.. ° ° °  PROCEDURAL EXPEDITER PROGRESS NOTE  Patient Name: Rick Edwards  DOB:[birth year] Date of Admission: 08/07/2024  Date of Assessment:08/10/2024   -------------------------------------------------------------------------------------------------------------------   Brief clinical summary: Pt to have an EGD tomorrow  Orders in place:  Yes   Labs, test, and orders reviewed: Y  Requires surgical clearance:  No  Barriers noted: N/A  -------------------------------------------------------------------------------------------------------------------  [facility], [address], [place], [place] Please contact us  directly via secure chat (search for [facility]) or by calling us  at [phone] [facility]).

## 2024-08-10 NOTE — Plan of Care (Signed)

## 2024-08-10 NOTE — Progress Notes (Signed)
 PHARMACY - ANTICOAGULATION CONSULT NOTE  Pharmacy Consult for heparin  Indication: antithrombin III  deficiency and Hx DVT; holding home warfarin for procedure  Allergies[1]  Patient Measurements: Height: 5' 9 (175.3 cm) Weight: 87.6 kg (193 lb 2 oz) IBW/kg (Calculated) : 70.7 HEPARIN  DW (KG): 89.6  Vital Signs: Temp: 97.9 F (36.6 C) (01/04 1559) Temp Source: Oral (01/04 1559) BP: 115/73 (01/04 1559) Pulse Rate: 98 (01/04 1559)  Labs: Recent Labs    08/08/24 1037 08/09/24 0830 08/09/24 1751 08/10/24 0627 08/10/24 1648  HGB 12.1* 11.0* 10.6* 10.3* 10.8*  HCT 35.4* 32.2* 31.0* 30.1* 32.1*  PLT 291 274 290 274 307  APTT  --  41*  --   --   --   LABPROT 30.2* 35.4*  --  20.4*  --   INR 2.7* 3.3*  --  1.6*  --   HEPARINUNFRC  --   --   --   --  1.02*  CREATININE 0.95 0.92  --  0.91  --     Estimated Creatinine Clearance: 87.5 mL/min (by C-G formula based on SCr of 0.91 mg/dL).   Medical History: Past Medical History:  Diagnosis Date   Anemia    anemia thrombocytopenia, saw Hematology 2011, can not r/o myeloproliferative d/c   Antithrombin III  deficiency 05/16/2013   On coumadin  for this   Blood clot in vein    in right leg   Chronic kidney disease 05/27/2013   ACUTE RENAL FAILURE    Crohn's disease (HCC)    tx. Imuran    GI bleed 6/11   w/ normal EGD 6/11, 3 PRBCs    Hearing loss    wears hearing aid left ear; birthed with hearing loss   HOH (hard of hearing) 05/16/2013   Hypercalcemia    Hyperkalemia 05/27/2013   Hypertension    Ileostomy in place [facility]) 04-11-13   04-18-13 ileostomy to be taken down.   Perianal abscess 2001   s/p right hemicolectomy, and drainage of retroperitoneal abcess 2003   Peripheral vascular disease    Transfusion history    last 8'13    Medications:  Scheduled:   allopurinol   300 mg Oral Daily   atorvastatin   40 mg Oral Daily   doxazosin   4 mg Oral QHS   lidocaine  (PF)  10 mL Intradermal Once   lidocaine  (PF)  5 mL  Intradermal Once   melatonin  5 mg Oral QHS   pantoprazole  (PROTONIX ) IV  40 mg Intravenous Q12H   senna-docusate  1 tablet Oral BID   sodium chloride  flush  3 mL Intravenous Q12H   sodium chloride  flush  3 mL Intravenous Q12H   spironolactone   25 mg Oral Daily    Assessment: Patient is a 69 YO male admitted with melena/suspected GI bleeding. Patient on warfarin PTA. Pharmacy consulted to dose heparin  without bolus to bridge patient until 0330 tomorrow before EGD on 1/5 now that INR is < 2. Hgb stable in the 10s, plt WNL. Most recent outpatient regimen was warfarin 5mg  tablet, 1.5 tabs daily except 1 tablet on MWF per most recent anticoag note on 12/30. INR was 2.7 on presentation to hospital.  PM update: heparin  level 1.02 on 1400 units/hr. Confirmed with patient that level was drawn from opposite arm as heparin  infusion. No bleeding reported.   Goal of Therapy:  Heparin  level 0.3-0.7 units/ml Monitor platelets by anticoagulation protocol: Yes   Plan:  -Hold heparin  drip x 1 hour -Resume heparin  drip at 1300 units/hr -Check heparin  level in 6hrs -  Drip to stop 1/5 at 0330 for procedure -F/u anticoagulation plans after EGD tomorrow  Rocky Slade, PharmD, BCPS 08/10/2024 5:32 PM  Please check AMION for all [facility] Pharmacy phone numbers After 10:00 PM, call Main Pharmacy [phone]       [1]  Allergies Allergen Reactions   Olmesartan      Hx of severe AKI requiring HD on ACE per renal note, hx of AKI and hypotension with olmesartan  07/2023   Mesalamine Rash    REACTION: Rash

## 2024-08-10 NOTE — Progress Notes (Signed)
 " PROGRESS NOTE    Rick Edwards  FMW:[id number] DOB: Oct 22, 1955 DOA: 08/07/2024 PCP: Rick Debby CROME, MD   Brief Narrative:  Rick Edwards is a 69 year old male with extensive history of Antithrombin III  deficiency chronically on Coumadin , CKD, Crohn's disease, chronic anemia, hypercalcemic hyperkalemia HTN, ileostomy/takedown 2014, PVD,... Presented to ED with chief complaint of black-colored stool and shortness of breath and was found to have moderate size right pleural effusion and possible GI bleed.  GI consulted.  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  FOBT ordered but is still pending.  Seen by GI, plan for EGD tomorrow morning.  Continue Protonix .  Moderate right pleural effusion: This is recurrent issue for him.  Underwent thoracentesis by IR 08/09/2024 with a yield of 100 cc fluid.  Fluid analysis consistent with lymphocyte predominant exudative fluid.  No LDH on chart, I am checking LDH.  Patient has no history of weight loss and no travel to TB Dominant countries either so TB remains at the bottom of the suspicion.  Malignancy is another differential with this kind of fluid analysis but patient denies any weight loss.  Will await final pathology report.  Antithrombin 3 deficiency/supratherapeutic INR: Chronically anticoagulated on Coumadin , INR 2.7 upon arrival.  Coumadin  on hold due to GI bleed.  No INR reversal agents were given to the  patient.  INR 1.6 today.  Starting on heparin  without bolus today to stop tomorrow at 3:30 AM/6 hours before EGD.  Right renal mass: Multiple bilateral renal cysts, 1 including 2.1 cm right kidney possible RCC On a CT scan-EDP Dr. Elnor has discussed the findings with on-call urologist Does not recommend any inpatient further workup -to follow-up as an outpatient.  Type 2 diabetes mellitus: Last hemoglobin A1c 6.2, currently not on any medications.  Stage III CKD: At baseline.  History of chron's disease: Stable.  PVD: Continue statins.  Dyslipidemia: Continue statin.  DVT prophylaxis: SCDs Start: 08/08/24 1636 Place TED hose Start: 08/08/24 1636   Code Status: Full Code  Family Communication: Multiple family members present at bedside.  Plan of care discussed with patient in length and he/she verbalized understanding and agreed with it.  Status is: Inpatient Remains inpatient appropriate because: EGD scheduled tomorrow     Estimated body mass index is 28.52 kg/m as calculated from the following:   Height as of this encounter: 5' 9 (1.753 m).   Weight as of this encounter: 87.6 kg.    Nutritional Assessment: Body mass index is 28.52 kg/m.[name] Seen by dietician.  I agree with the assessment and plan as outlined below: Nutrition Status:        . Skin Assessment: I have examined the patient's skin and I agree with the wound assessment as performed by the wound care RN as outlined below:    Consultants:  GI  Procedures:  None none  Antimicrobials:  Anti-infectives (From admission, onward)    None  Subjective: Patient seen and examined.  He has no complaints at all.  Objective: Vitals:   08/10/24 0047 08/10/24 0500 08/10/24 0543 08/10/24 0751  BP: 126/68  119/79 (!) 123/92  Pulse: 81  83 86  Resp: 17  17 16   Temp:   99 F (37.2 C) 98.3 F (36.8 C)  TempSrc:   Oral Oral  SpO2: 96%  96% 99%  Weight:  87.6 kg    Height:        Intake/Output  Summary (Last 24 hours) at 08/10/2024 1105 Last data filed at 08/10/2024 0500 Gross per 24 hour  Intake --  Output 250 ml  Net -250 ml   Filed Weights   08/07/24 1350 08/09/24 0500 08/10/24 0500  Weight: 92.5 kg 89.2 kg 87.6 kg    Examination:  General exam: Appears calm and comfortable  Respiratory system: Clear to auscultation. Respiratory effort normal. Cardiovascular system: S1 & S2 heard, RRR. No JVD, murmurs, rubs, gallops or clicks. No pedal edema. Gastrointestinal system: Abdomen is nondistended, soft and nontender. No organomegaly or masses felt. Normal bowel sounds heard. Central nervous system: Alert and oriented. No focal neurological deficits. Extremities: Symmetric 5 x 5 power. Skin: No rashes, lesions or ulcers.  Psychiatry: Judgement and insight appear normal. Mood & affect appropriate.   Data Reviewed: I have personally reviewed following labs and imaging studies  CBC: Recent Labs  Lab 08/07/24 1352 08/08/24 1037 08/09/24 0830 08/09/24 1751 08/10/24 0627  WBC 8.5 9.2 8.0 8.7 7.9  NEUTROABS 5.9  --   --   --   --   HGB 13.2 12.1* 11.0* 10.6* 10.3*  HCT 39.5 35.4* 32.2* 31.0* 30.1*  MCV 92.1 91.9 91.0 91.7 90.4  PLT 317 291 274 290 274   Basic Metabolic Panel: Recent Labs  Lab 08/07/24 1352 08/08/24 1037 08/09/24 0830 08/10/24 0627  NA 138 136 139 140  K 4.2 4.1 4.0 4.0  CL 101 102 104 105  CO2 27 25 25 26   GLUCOSE 104* 106* 105* 107*  BUN 25* 36* 23 16  CREATININE 0.89 0.95 0.92 0.91  CALCIUM  9.4 9.0 8.7* 8.6*  MG  --   --  1.6*  --   PHOS  --   --  3.6  --    GFR: Estimated Creatinine Clearance: 87.5 mL/min (by C-G formula based on SCr of 0.91 mg/dL). Liver Function Tests: Recent Labs  Lab 08/07/24 1352  AST 17  ALT 27  ALKPHOS 87  BILITOT 0.9  PROT 6.6  ALBUMIN  3.8   No results for input(s): LIPASE, AMYLASE in the last 168 hours. No results for input(s): AMMONIA in the last 168 hours. Coagulation Profile: Recent Labs  Lab  08/05/24 0000 08/08/24 1037 08/09/24 0830 08/10/24 0627  INR 3.3* 2.7* 3.3* 1.6*   Cardiac Enzymes: No results for input(s): CKTOTAL, CKMB, CKMBINDEX, TROPONINI in the last 168 hours. BNP (last 3 results) Recent Labs    08/08/24 1227  PROBNP <50.0   HbA1C: No results for input(s): HGBA1C in the last 72 hours. CBG: Recent Labs  Lab 08/09/24 0740 08/09/24 1141  GLUCAP 106* 148*   Lipid Profile: No results for input(s): CHOL, HDL, LDLCALC, TRIG, CHOLHDL, LDLDIRECT in the last 72 hours. Thyroid  Function Tests: No results for input(s): TSH, T4TOTAL, FREET4, T3FREE, THYROIDAB in the last 72 hours. Anemia Panel: No results for input(s): VITAMINB12, FOLATE, FERRITIN, TIBC, IRON, RETICCTPCT in the last 72 hours. Sepsis Labs: No results for input(s): PROCALCITON, LATICACIDVEN in the last 168 hours.  No results found for this or any previous visit (from the past 240 hours).   Radiology Studies: US  THORACENTESIS ASP PLEURAL SPACE W/IMG GUIDE Result Date: 08/09/2024 EXAM: ULTRASOUND GUIDED RIGHT THORACENTESIS MEDICATIONS: 15 ml 1% lidocaine  COMPLICATIONS: SIR Level A - No therapy, no consequence. During procedure, patient experienced vagal response. BP dropped to 76/50 and patient reported feeling anxious and dizzy. HR 85, spO2 98% at that time. Catheter was removed and patient was assisted to lie down. After lying down, patient experienced relief and BP improved to 131/76. PROCEDURE: An ultrasound guided thoracentesis was thoroughly discussed with the patient and questions answered. The benefits, risks, alternatives and complications were also discussed. The patient understands and wishes to proceed with the procedure. Written consent was obtained. Ultrasound was performed to localize and mark an adequate pocket of fluid in the right chest. The area was then prepped and draped in the normal sterile fashion. 1% Lidocaine  was used for local  anesthesia. Under ultrasound guidance a 6 Fr Safe-T-Centesis catheter was introduced. Thoracentesis was performed. The catheter was removed and a dressing applied. FINDINGS: A total of approximately 100 milliliters of amber fluid was removed. Samples were sent to the laboratory as requested by the clinical team. IMPRESSION: Successful ultrasound guided right thoracentesis yielding 100 milliliters of pleural fluid. Performed by Laymon Coast, NP under the supervision of Dr. Jenna. Electronically Signed   By: Cordella Jenna   On: 08/09/2024 11:25   DG Chest Port 1 View Result Date: 08/09/2024 EXAM: 1 VIEW(S) XRAY OF THE CHEST 08/09/2024 09:33:18 AM COMPARISON: 08/07/2024 CLINICAL HISTORY: Status post thoracentesis FINDINGS: LUNGS AND PLEURA: Slight decrease in moderate right pleural effusion. Similar airspace opacity at the right lung base. No pneumothorax. HEART AND MEDIASTINUM: No acute abnormality of the cardiac and mediastinal silhouettes. BONES AND SOFT TISSUES: No acute osseous abnormality. IMPRESSION: 1. Slight decrease in moderate right pleural effusion with similar right basilar airspace opacity. Electronically signed by: Norman Gatlin MD 08/09/2024 09:47 AM EST RP Workstation: [id number]   CT CHEST ABDOMEN PELVIS W CONTRAST Result Date: 08/08/2024 CLINICAL DATA:  Acute generalized abdominal pain. EXAM: CT CHEST, ABDOMEN, AND PELVIS WITH CONTRAST TECHNIQUE: Multidetector CT imaging of the chest, abdomen and pelvis was performed following the standard protocol during bolus administration of intravenous contrast. RADIATION DOSE REDUCTION: This exam was performed according to the departmental dose-optimization program which includes automated exposure control, adjustment of the mA and/or kV according to patient size and/or use of iterative reconstruction technique. CONTRAST:  75mL OMNIPAQUE  IOHEXOL  350 MG/ML SOLN COMPARISON:  July 15, 2024.  August 19, 2023. FINDINGS: CT CHEST FINDINGS  Cardiovascular: No evidence of thoracic aortic dissection or aneurysm. Enlarged pulmonary artery suggesting pulmonary artery hypertension. Normal cardiac size. No pericardial effusion. Mediastinum/Nodes: No enlarged mediastinal, hilar, or axillary lymph nodes. Thyroid  gland, trachea, and esophagus demonstrate no significant findings. Lungs/Pleura: Moderate size right pleural effusion is noted with adjacent subsegmental atelectasis. Lobar atelectasis of right lower lobe is noted. No pneumothorax is noted. Stable 1.4 by 1.1 cm left upper lobe lesion with cystic and solid areas best seen on image number 33 of series 4. Musculoskeletal: No chest wall mass or suspicious bone lesions identified. CT ABDOMEN PELVIS FINDINGS Hepatobiliary: No cholelithiasis or biliary dilatation is noted. Stable hepatic cysts. Pancreas: Unremarkable. No pancreatic ductal dilatation or surrounding inflammatory changes. Spleen: Normal in size without focal abnormality. Adrenals/Urinary Tract: Adrenal glands appear normal. Multiple bilateral renal cystic lesions are again noted, including 2.1 cm partially exophytic complex region arising from midpole of  right kidney. This was described as possible renal cell carcinoma on prior MRI. No hydronephrosis or renal obstruction is noted. Urinary bladder is unremarkable. Stomach/Bowel: Stomach is unremarkable. Status post partial colectomy. There is no evidence of bowel obstruction or inflammation. Large left periumbilical hernia is noted which contains loops of small bowel, but does not result in obstruction. Vascular/Lymphatic: Aortic atherosclerosis. No enlarged abdominal or pelvic lymph nodes. Reproductive: Mild prostatic enlargement is noted. Other: No ascites is noted. Musculoskeletal: No acute or significant osseous findings. IMPRESSION: 1. Moderate size right pleural effusion is noted with adjacent subsegmental atelectasis. Lobar atelectasis of right lower lobe is noted. 2. Stable 1.4 x 1.1 cm  left upper lobe lesion with cystic and solid areas. This is concerning for possible malignancy. Consider one of the following in 3 months for both low-risk and high-risk individuals: (a) repeat chest CT, (b) follow-up PET-CT, or (c) tissue sampling. This recommendation follows the consensus statement: Guidelines for Management of Incidental Pulmonary Nodules Detected on CT Images: From the Fleischner Society 2017; Radiology 2017; 284:228-243. 3. Multiple bilateral renal cystic lesions are again noted, including 2.1 cm partially exophytic complex region arising from midpole of right kidney. This was described as possible renal cell carcinoma on prior MRI. Consultation with urology is recommended. 4. Large left periumbilical hernia is noted which contains loops of small bowel, but does not result in obstruction. 5. Mild prostatic enlargement. 6. Aortic atherosclerosis. Aortic Atherosclerosis (ICD10-I70.0). Electronically Signed   By: Lynwood Landy Raddle M.D.   On: 08/08/2024 16:00    Scheduled Meds:  allopurinol   300 mg Oral Daily   atorvastatin   40 mg Oral Daily   doxazosin   4 mg Oral QHS   lidocaine  (PF)  10 mL Intradermal Once   lidocaine  (PF)  5 mL Intradermal Once   melatonin  5 mg Oral QHS   pantoprazole  (PROTONIX ) IV  40 mg Intravenous Q12H   senna-docusate  1 tablet Oral BID   sodium chloride  flush  3 mL Intravenous Q12H   sodium chloride  flush  3 mL Intravenous Q12H   spironolactone   25 mg Oral Daily   Continuous Infusions:  heparin  1,450 Units/hr (08/10/24 0910)     LOS: 1 day   Fredia Skeeter, MD [facility]  08/10/2024, 11:05 AM   *Please note that this is a verbal dictation therefore any spelling or grammatical errors are due to the Dragon Medical One system interpretation.  Please page via Amion and do not message via secure chat for urgent patient care matters. Secure chat can be used for non urgent patient care matters.  How to contact the [facility] Attending or Consulting  provider 7A - 7P or covering provider during after hours 7P -7A, for this patient?  Check the care team in [facility] and look for a) attending/consulting [facility] provider listed and b) the [facility] team listed. Page or secure chat 7A-7P. Log into [email or web address] and use [facility]'s universal password to access. If you do not have the password, please contact the hospital operator. Locate the [facility] provider you are looking for under [facility] and page to a number that you can be directly reached. If you still have difficulty reaching the provider, please page the [facility] (Director on Call) for the Hospitalists listed on amion for assistance.  "

## 2024-08-10 NOTE — Plan of Care (Signed)
   Problem: Education: Goal: Knowledge of General Education information will improve Description Including pain rating scale, medication(s)/side effects and non-pharmacologic comfort measures Outcome: Progressing   Problem: Health Behavior/Discharge Planning: Goal: Ability to manage health-related needs will improve Outcome: Progressing

## 2024-08-11 ENCOUNTER — Encounter (HOSPITAL_COMMUNITY): Payer: Self-pay | Admitting: Family Medicine

## 2024-08-11 ENCOUNTER — Inpatient Hospital Stay (HOSPITAL_COMMUNITY): Admitting: Anesthesiology

## 2024-08-11 ENCOUNTER — Encounter (HOSPITAL_COMMUNITY)
Admission: EM | Disposition: A | Discharge status: Home / Self Care | Payer: Self-pay | Source: Home / Self Care | Attending: Family Medicine

## 2024-08-11 DIAGNOSIS — K269 Duodenal ulcer, unspecified as acute or chronic, without hemorrhage or perforation: Secondary | ICD-10-CM

## 2024-08-11 DIAGNOSIS — Z87891 Personal history of nicotine dependence: Secondary | ICD-10-CM | POA: Diagnosis not present

## 2024-08-11 DIAGNOSIS — E119 Type 2 diabetes mellitus without complications: Secondary | ICD-10-CM

## 2024-08-11 DIAGNOSIS — I10 Essential (primary) hypertension: Secondary | ICD-10-CM | POA: Diagnosis not present

## 2024-08-11 DIAGNOSIS — K922 Gastrointestinal hemorrhage, unspecified: Secondary | ICD-10-CM | POA: Diagnosis not present

## 2024-08-11 HISTORY — PX: HEMOSTASIS CLIP PLACEMENT: SHX6857

## 2024-08-11 HISTORY — PX: ESOPHAGOGASTRODUODENOSCOPY: SHX5428

## 2024-08-11 LAB — PROTIME-INR
INR: 1.4 — ABNORMAL HIGH (ref 0.8–1.2)
Prothrombin Time: 18.2 s — ABNORMAL HIGH (ref 11.4–15.2)

## 2024-08-11 LAB — CBC
HCT: 30.1 % — ABNORMAL LOW (ref 39.0–52.0)
Hemoglobin: 10.2 g/dL — ABNORMAL LOW (ref 13.0–17.0)
MCH: 31.2 pg (ref 26.0–34.0)
MCHC: 33.9 g/dL (ref 30.0–36.0)
MCV: 92 fL (ref 80.0–100.0)
Platelets: 284 K/uL (ref 150–400)
RBC: 3.27 MIL/uL — ABNORMAL LOW (ref 4.22–5.81)
RDW: 13.6 % (ref 11.5–15.5)
WBC: 8.6 K/uL (ref 4.0–10.5)
nRBC: 0 % (ref 0.0–0.2)

## 2024-08-11 LAB — GLUCOSE, CAPILLARY
Glucose-Capillary: 94 mg/dL (ref 70–99)
Glucose-Capillary: 110 mg/dL — ABNORMAL HIGH (ref 70–99)
Glucose-Capillary: 106 mg/dL — ABNORMAL HIGH (ref 70–99)
Glucose-Capillary: 100 mg/dL — ABNORMAL HIGH (ref 70–99)

## 2024-08-11 LAB — HEPARIN LEVEL (UNFRACTIONATED)
Heparin Unfractionated: >1.1 [IU]/mL — ABNORMAL HIGH (ref 0.30–0.70)
Heparin Unfractionated: 0.61 [IU]/mL (ref 0.30–0.70)

## 2024-08-11 MED ORDER — PHENYLEPHRINE 80 MCG/ML (10ML) SYRINGE FOR IV PUSH (FOR BLOOD PRESSURE SUPPORT)
PREFILLED_SYRINGE | INTRAVENOUS | Status: DC | PRN
  Administered 2024-08-11: 160 ug via INTRAVENOUS

## 2024-08-11 MED ORDER — SODIUM CHLORIDE 0.9 % IV SOLN: INTRAVENOUS | Status: DC

## 2024-08-11 MED ORDER — PROPOFOL 10 MG/ML IV BOLUS
INTRAVENOUS | Status: DC | PRN
  Administered 2024-08-11: 20 mg via INTRAVENOUS
  Administered 2024-08-11: 40 mg via INTRAVENOUS
  Administered 2024-08-11: 10 mg via INTRAVENOUS
  Administered 2024-08-11: 20 mg via INTRAVENOUS

## 2024-08-11 MED ORDER — HEPARIN (PORCINE) 25000 UT/250ML-% IV SOLN
800.0000 [IU]/h | INTRAVENOUS | Status: DC
  Administered 2024-08-11: 1000 [IU]/h via INTRAVENOUS
  Administered 2024-08-12: 850 [IU]/h via INTRAVENOUS
  Administered 2024-08-14: 800 [IU]/h via INTRAVENOUS
  Filled 2024-08-11 (×3): qty 250

## 2024-08-11 MED ORDER — PANTOPRAZOLE SODIUM 40 MG IV SOLR
40.0000 mg | Freq: Four times a day (QID) | INTRAVENOUS | Status: DC
  Administered 2024-08-11 – 2024-08-13 (×7): 40 mg via INTRAVENOUS
  Filled 2024-08-11 (×7): qty 10

## 2024-08-11 MED ORDER — LIDOCAINE 2% (20 MG/ML) 5 ML SYRINGE
INTRAMUSCULAR | Status: DC | PRN
  Administered 2024-08-11: 80 mg via INTRAVENOUS

## 2024-08-11 MED ORDER — PROPOFOL 500 MG/50ML IV EMUL
INTRAVENOUS | Status: DC | PRN
  Administered 2024-08-11: 60 mg via INTRAVENOUS
  Administered 2024-08-11: 150 ug/kg/min via INTRAVENOUS

## 2024-08-11 NOTE — Op Note (Signed)
 [facility] Patient Name: Rick Edwards Procedure Date : 08/11/2024 MRN: [id number] Attending MD: [name] , MD, [id number] Date of Birth: 08-06-1956 CSN: [id number] Age: 69 Admit Type: Inpatient Procedure:                Upper GI endoscopy Indications:              Melena Providers:                Jerrell KYM Sol, MD, Ozell Pouch, Curtistine Bishop, Technician Referring MD:             hospital team Medicines:                Propofol  per Anesthesia, Monitored Anesthesia Care Complications:            No immediate complications. Estimated Blood Loss:     Estimated blood loss was minimal. Procedure:                Pre-Anesthesia Assessment:                           - Prior to the procedure, a History and Physical                            was performed, and patient medications and                            allergies were reviewed. The patient's tolerance of                            previous anesthesia was also reviewed. The risks                            and benefits of the procedure and the sedation                            options and risks were discussed with the patient.                            All questions were answered, and informed consent                            was obtained. Prior Anticoagulants: The patient has                            taken Coumadin  (warfarin), last dose was stopped at                            admission. ASA Grade Assessment: III - A patient                            with severe systemic disease. After reviewing the  risks and benefits, the patient was deemed in                            satisfactory condition to undergo the procedure.                           After obtaining informed consent, the endoscope was                            passed under direct vision. Throughout the                            procedure, the patient's blood pressure,  pulse, and                            oxygen saturations were monitored continuously. The                            [id number]-H190 ([id number]) Olympus endoscope was introduced                            through the mouth, and advanced to the second part                            of duodenum. The upper GI endoscopy was                            accomplished without difficulty. The patient                            tolerated the procedure well. Scope In: Scope Out: Findings:      The examined esophagus was normal.      The Z-line was regular and was found 40 cm from the incisors.      There was a large lipoma in the gastric antrum.      The cardia and gastric fundus were normal on retroflexion.      One oozing cratered duodenal ulcer with pigmented material was found in       the duodenal bulb. The lesion was 10 mm in largest dimension. To stop       active bleeding, hemostatic spray was deployed. A single spray was       applied. There was no bleeding at the end of the procedure.      The second portion of the duodenum was normal. Impression:               - Normal esophagus.                           - Z-line regular, 40 cm from the incisors.                           - Gastric lipoma.                           - Oozing duodenal ulcer with pigmented material.  Hemostatic spray applied.                           - Normal second portion of the duodenum.                           - No specimens collected. Recommendation:           - NPO.                           - Observe patient's clinical course.                           - Protonix  40 mg IV Q 6 hours. Hold Heparin  for                            another 4 hours and then resume. High risk for                            rebleeding right now especially if Coumadin                             restarted. If rebleeding occurs, then will need                            embolization by IR. Procedure Code(s):        ---  Professional ---                           [phone], Esophagogastroduodenoscopy, flexible,                            transoral; with control of bleeding, any method Diagnosis Code(s):        --- Professional ---                           K92.1, Melena (includes Hematochezia)                           K26.4, Chronic or unspecified duodenal ulcer with                            hemorrhage                           D17.5, Benign lipomatous neoplasm of                            intra-abdominal organs CPT copyright 2022 [employer]. All rights reserved. The codes documented in this report are preliminary and upon coder review may  be revised to meet current compliance requirements. [name], MD 08/11/2024 10:25:57 AM This report has been signed electronically. Number of Addenda: 0

## 2024-08-11 NOTE — Progress Notes (Signed)
 PHARMACY - ANTICOAGULATION CONSULT NOTE  Pharmacy Consult for heparin  Indication: ATIII deficiency and h/o VTE  Labs: Recent Labs    08/09/24 0830 08/09/24 1751 08/10/24 0627 08/10/24 1648 08/11/24 0224 08/11/24 2208  HGB 11.0*   < > 10.3* 10.8* 10.2*  --   HCT 32.2*   < > 30.1* 32.1* 30.1*  --   PLT 274   < > 274 307 284  --   APTT 41*  --   --   --   --   --   LABPROT 35.4*  --  20.4*  --  18.2*  --   INR 3.3*  --  1.6*  --  1.4*  --   HEPARINUNFRC  --   --   --  1.02* >1.10* 0.61  CREATININE 0.92  --  0.91  --   --   --    < > = values in this interval not displayed.   Assessment/Plan:  69yo male therapeutic on heparin  after resuming. Will continue infusion at current rate of 1000 units/hr and confirm stable with am labs.  Marvetta Dauphin, PharmD, BCPS 08/11/2024 10:49 PM

## 2024-08-11 NOTE — Progress Notes (Signed)
 PT Cancellation Note  Patient Details Name: Rick Edwards MRN: [id number] DOB: 1956/07/29   Cancelled Treatment:    Reason Eval/Treat Not Completed: Patient at procedure or test/unavailable. Pt currently off unit for procedure. Will check back as schedule allows to continue with PT POC.    [name] 08/11/2024, 8:47 AM  Leita Sable, PT, DPT Acute Rehabilitation Services Secure Chat Preferred Office: [phone]

## 2024-08-11 NOTE — Progress Notes (Signed)
 PHARMACY - ANTICOAGULATION CONSULT NOTE  Pharmacy Consult for heparin  Indication: antithrombin III  deficiency and Hx DVT; holding home warfarin  Allergies[1]  Patient Measurements: Height: 5' 9 (175.3 cm) Weight: 87.3 kg (192 lb 7.4 oz) IBW/kg (Calculated) : 70.7 HEPARIN  DW (KG): 87.3  Vital Signs: Temp: 98.6 F (37 C) (01/05 1109) Temp Source: Oral (01/05 1109) BP: 131/76 (01/05 1109) Pulse Rate: 95 (01/05 1109)  Labs: Recent Labs    08/09/24 0830 08/09/24 1751 08/10/24 0627 08/10/24 1648 08/11/24 0224  HGB 11.0*   < > 10.3* 10.8* 10.2*  HCT 32.2*   < > 30.1* 32.1* 30.1*  PLT 274   < > 274 307 284  APTT 41*  --   --   --   --   LABPROT 35.4*  --  20.4*  --  18.2*  INR 3.3*  --  1.6*  --  1.4*  HEPARINUNFRC  --   --   --  1.02* >1.10*  CREATININE 0.92  --  0.91  --   --    < > = values in this interval not displayed.    Estimated Creatinine Clearance: 87.3 mL/min (by C-G formula based on SCr of 0.91 mg/dL).   Medical History: Past Medical History:  Diagnosis Date   Anemia    anemia thrombocytopenia, saw Hematology 2011, can not r/o myeloproliferative d/c   Antithrombin III  deficiency 05/16/2013   On coumadin  for this   Blood clot in vein    in right leg   Chronic kidney disease 05/27/2013   ACUTE RENAL FAILURE    Crohn's disease (HCC)    tx. Imuran    GI bleed 6/11   w/ normal EGD 6/11, 3 PRBCs    Hearing loss    wears hearing aid left ear; birthed with hearing loss   HOH (hard of hearing) 05/16/2013   Hypercalcemia    Hyperkalemia 05/27/2013   Hypertension    Ileostomy in place [facility]) 04-11-13   04-18-13 ileostomy to be taken down.   Perianal abscess 2001   s/p right hemicolectomy, and drainage of retroperitoneal abcess 2003   Peripheral vascular disease    Transfusion history    last 8'13    Medications:  Scheduled:   allopurinol   300 mg Oral Daily   atorvastatin   40 mg Oral Daily   doxazosin   4 mg Oral QHS   melatonin  5 mg Oral QHS    pantoprazole  (PROTONIX ) IV  40 mg Intravenous Q12H   senna-docusate  1 tablet Oral BID   sodium chloride  flush  3 mL Intravenous Q12H   sodium chloride  flush  3 mL Intravenous Q12H   spironolactone   25 mg Oral Daily    Assessment: Patient is a 69 YO male admitted with melena/suspected GI bleeding. Patient on warfarin PTA. Pharmacy consulted to dose heparin  while warfarin on hold.  Patient is s/p EGD 08/11/2024 with bleeding duodenal ulcer.  GI recs holding warfarin x 1 week.  OK to restart heparin  4 hours after procedure and monitor for rebleeding.  Patient was supratherapeutic on previous heparin  rates.  Will reduce rate and restart at 1430 today.  Goal of Therapy:  Heparin  level 0.3-0.7 units/ml Monitor platelets by anticoagulation protocol: Yes   Plan:  At 1430, start heparin  at 1000 units/hr Check heparin  level in 8 hours Heparin  level and CBC daily while on heparin   [employer], Pharm.D., BCPS Clinical Pharmacist Clinical phone for 08/11/2024 from 7:30-3:00 is [phone].  **Pharmacist phone directory can be found on [email or web address] listed  under [facility].  08/11/2024 12:02 PM         [1]  Allergies Allergen Reactions   Olmesartan      Hx of severe AKI requiring HD on ACE per renal note, hx of AKI and hypotension with olmesartan  07/2023   Mesalamine Rash    REACTION: Rash

## 2024-08-11 NOTE — Anesthesia Preprocedure Evaluation (Addendum)
 "                                  Anesthesia Evaluation  Patient identified by MRN, date of birth, ID band Patient awake    Reviewed: Allergy & Precautions, NPO status , Patient's Chart, lab work & pertinent test results  History of Anesthesia Complications Negative for: history of anesthetic complications  Airway Mallampati: II  TM Distance: >3 FB Neck ROM: Full    Dental no notable dental hx. (+) Poor Dentition, Missing   Pulmonary former smoker   Pulmonary exam normal breath sounds clear to auscultation       Cardiovascular hypertension, Pt. on medications (-) angina + Peripheral Vascular Disease  (-) Past MI Normal cardiovascular exam Rhythm:Regular Rate:Normal  EKG 08/08/24 NSR, Normal  Echo 8/713 - Left ventricle: The cavity size was normal. Wall thickness    was normal. Systolic function was normal. The estimated    ejection fraction was in the range of 60% to 65%.  - Aortic valve: AV is calcified but opens well with no    gradient and no AR Parasternal views suggest some systolic    bowing but on short axis valve is trileaflet  Transthoracic echocardiography.  M-mode, complete 2D,  spectral Doppler, and color Doppler.  Height:  Height:  175.3cm. Height: 69in.  Weight:  Weight: 65.8kg. Weight:  144.7lb.  Body mass index:  BMI: 21.4kg/m^2.  Body surface  area:    BSA: 1.46m^2.  Blood pressure:     107/86.  Patient  status:  Inpatient.  Location:  Bedside.     Neuro/Psych negative neurological ROS  negative psych ROS   GI/Hepatic Melena   Endo/Other  diabetes, Well Controlled, Type 2    Renal/GU Renal InsufficiencyRenal diseaseLab Results      Component                Value               Date                      NA                       140                 08/10/2024                CL                       105                 08/10/2024                K                        4.0                 08/10/2024                CO2                       26                  08/10/2024  BUN                      16                  08/10/2024                CREATININE               0.91                08/10/2024                GFRNONAA                 >60                 08/10/2024                CALCIUM                   8.6 (L)             08/10/2024                PHOS                     3.6                 08/09/2024                ALBUMIN                   3.8                 08/07/2024                GLUCOSE                  107 (H)             08/10/2024             negative genitourinary   Musculoskeletal  (+) Arthritis , Osteoarthritis,    Abdominal   Peds  Hematology  (+) Blood dyscrasia, anemia Lab Results      Component                Value               Date                      WBC                      8.6                 08/11/2024                HGB                      10.2 (L)            08/11/2024                HCT                      30.1 (L)            08/11/2024                MCV  92.0                08/11/2024                PLT                      284                 08/11/2024              Anesthesia Other Findings   Reproductive/Obstetrics                              Anesthesia Physical Anesthesia Plan  ASA: 3  Anesthesia Plan: MAC   Post-op Pain Management: Minimal or no pain anticipated   Induction: Intravenous  PONV Risk Score and Plan: 1 and Treatment may vary due to age or medical condition and Propofol  infusion  Airway Management Planned: Natural Airway, Simple Face Mask and Nasal Cannula  Additional Equipment: None  Intra-op Plan:   Post-operative Plan:   Informed Consent: I have reviewed the patients History and Physical, chart, labs and discussed the procedure including the risks, benefits and alternatives for the proposed anesthesia with the patient or authorized representative who has indicated his/her understanding  and acceptance.     Dental advisory given  Plan Discussed with: CRNA and Anesthesiologist  Anesthesia Plan Comments:         Anesthesia Quick Evaluation  "

## 2024-08-11 NOTE — Interval H&P Note (Signed)
 History and Physical Interval Note:  08/11/2024 9:46 AM  Rick Edwards  has presented today for surgery, with the diagnosis of Melena, anemia.  The various methods of treatment have been discussed with the patient and family. After consideration of risks, benefits and other options for treatment, the patient has consented to  Procedures: EGD (ESOPHAGOGASTRODUODENOSCOPY) (N/A) as a surgical intervention.  The patient's history has been reviewed, patient examined, no change in status, stable for surgery.  I have reviewed the patient's chart and labs.  Questions were answered to the patient's satisfaction.     [name]

## 2024-08-11 NOTE — Plan of Care (Signed)
   Problem: Education: Goal: Knowledge of General Education information will improve Description Including pain rating scale, medication(s)/side effects and non-pharmacologic comfort measures Outcome: Progressing

## 2024-08-11 NOTE — Progress Notes (Signed)
 " PROGRESS NOTE    Rick Edwards  FMW:[id number] DOB: 07/12/1956 DOA: 08/07/2024 PCP: Joshua Debby CROME, MD   Brief Narrative:  Rick Edwards is a 69 year old male with extensive history of Antithrombin III  deficiency chronically on Coumadin , CKD, Crohn's disease, chronic anemia, hypercalcemic hyperkalemia HTN, ileostomy/takedown 2014, PVD,... Presented to ED with chief complaint of black-colored stool and shortness of breath and was found to have moderate size right pleural effusion and possible GI bleed.  GI consulted.  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  FOBT ordered but is still pending.  Seen by GI, plan for EGD today.  Continue Protonix .  Moderate right pleural effusion: This is recurrent issue for him.  Underwent thoracentesis by IR 08/09/2024 with a yield of 100 cc fluid.  Fluid analysis consistent with lymphocyte predominant exudative fluid.  No LDH on chart, I am checking LDH.  Patient has no history of weight loss and no travel to TB Dominant countries either so TB remains at the bottom of the suspicion.  Malignancy is another differential with this kind of fluid analysis but patient denies any weight loss.  Will await final cytology report which is still pending.  Antithrombin 3 deficiency/supratherapeutic INR: Chronically anticoagulated on Coumadin , INR 2.7 upon arrival.  Received vitamin K ,  INR 1.4 today.  Right renal mass: Multiple bilateral renal cysts, 1 including 2.1 cm right kidney possible RCC On a CT scan-EDP Dr. Elnor has discussed the findings with on-call urologist Does not recommend any inpatient further workup -to follow-up as an outpatient.  Type 2 diabetes mellitus: Last hemoglobin A1c 6.2, currently not on any medications.  Stage III CKD: At baseline.  History of chron's disease: Stable.  PVD: Continue statins.  Dyslipidemia: Continue statin.  DVT prophylaxis: SCDs Start: 08/08/24 1636 Place TED hose Start: 08/08/24 1636   Code Status: Full Code  Family Communication: Multiple family members present at bedside.  Plan of care discussed with patient in length and he/she verbalized understanding and agreed with it.  Status is: Inpatient Remains inpatient appropriate because: EGD scheduled tomorrow     Estimated body mass index is 28.42 kg/m as calculated from the following:   Height as of this encounter: 5' 9 (1.753 m).   Weight as of this encounter: 87.3 kg.    Nutritional Assessment: Body mass index is 28.42 kg/m.Rick Edwards Seen by dietician.  I agree with the assessment and plan as outlined below: Nutrition Status:        . Skin Assessment: I have examined the patient's skin and I agree with the wound assessment as performed by the wound care RN as outlined below:    Consultants:  GI  Procedures:  None none  Antimicrobials:  Anti-infectives (From admission, onward)    None         Subjective: Patient seen and examined prior to going down for EGD.  He had no complaints at  all.  Objective: Vitals:   08/10/24 2356 08/11/24 0336 08/11/24 0432 08/11/24 0750  BP: 130/80 138/81  (!) 166/94  Pulse: 89 90  94  Resp: 18 18  19   Temp: 98.9 F (37.2 C) 97.9 F (36.6 C)  98.9 F (37.2 C)  TempSrc: Oral Oral  Oral  SpO2: 95% 94%  97%  Weight:   87.3 kg   Height:        Intake/Output Summary (Last 24 hours) at 08/11/2024 0752 Last  data filed at 08/11/2024 0349 Gross per 24 hour  Intake 390.62 ml  Output 100 ml  Net 290.62 ml   Filed Weights   08/09/24 0500 08/10/24 0500 08/11/24 0432  Weight: 89.2 kg 87.6 kg 87.3 kg    Examination:  General exam: Appears calm and comfortable  Respiratory system: Clear to auscultation. Respiratory effort normal. Cardiovascular system: S1 & S2 heard, RRR. No JVD, murmurs, rubs, gallops or clicks. No pedal edema. Gastrointestinal system: Abdomen is nondistended, soft and nontender. No organomegaly or masses felt. Normal bowel sounds heard. Central nervous system: Alert and oriented. No focal neurological deficits. Extremities: Symmetric 5 x 5 power. Skin: No rashes, lesions or ulcers.  Psychiatry: Judgement and insight appear normal. Mood & affect appropriate.   Data Reviewed: I have personally reviewed following labs and imaging studies  CBC: Recent Labs  Lab 08/07/24 1352 08/08/24 1037 08/09/24 0830 08/09/24 1751 08/10/24 0627 08/10/24 1648 08/11/24 0224  WBC 8.5   < > 8.0 8.7 7.9 9.2 8.6  NEUTROABS 5.9  --   --   --   --   --   --   HGB 13.2   < > 11.0* 10.6* 10.3* 10.8* 10.2*  HCT 39.5   < > 32.2* 31.0* 30.1* 32.1* 30.1*  MCV 92.1   < > 91.0 91.7 90.4 92.8 92.0  PLT 317   < > 274 290 274 307 284   < > = values in this interval not displayed.   Basic Metabolic Panel: Recent Labs  Lab 08/07/24 1352 08/08/24 1037 08/09/24 0830 08/10/24 0627  NA 138 136 139 140  K 4.2 4.1 4.0 4.0  CL 101 102 104 105  CO2 27 25 25 26   GLUCOSE 104* 106* 105* 107*  BUN 25* 36* 23 16  CREATININE 0.89 0.95 0.92 0.91  CALCIUM  9.4 9.0 8.7* 8.6*  MG  --   --  1.6*  --   PHOS  --   --  3.6  --    GFR: Estimated Creatinine Clearance: 87.3 mL/min (by C-G formula based on SCr of 0.91 mg/dL). Liver Function Tests: Recent Labs  Lab 08/07/24 1352  AST 17  ALT 27  ALKPHOS 87  BILITOT 0.9  PROT 6.6  ALBUMIN  3.8   No results for input(s): LIPASE, AMYLASE in the last 168  hours. No results for input(s): AMMONIA in the last 168 hours. Coagulation Profile: Recent Labs  Lab 08/05/24 0000 08/08/24 1037 08/09/24 0830 08/10/24 0627 08/11/24 0224  INR 3.3* 2.7* 3.3* 1.6* 1.4*   Cardiac Enzymes: No results for input(s): CKTOTAL, CKMB, CKMBINDEX, TROPONINI in the last 168 hours. BNP (last 3 results) Recent Labs    08/08/24 1227  PROBNP <50.0   HbA1C: No results for input(s): HGBA1C in the last 72 hours. CBG: Recent Labs  Lab 08/09/24 0740 08/09/24 1141  GLUCAP 106* 148*   Lipid Profile: No results for input(s): CHOL, HDL, LDLCALC, TRIG, CHOLHDL, LDLDIRECT in the last 72 hours. Thyroid  Function Tests: No results  for input(s): TSH, T4TOTAL, FREET4, T3FREE, THYROIDAB in the last 72 hours. Anemia Panel: No results for input(s): VITAMINB12, FOLATE, FERRITIN, TIBC, IRON, RETICCTPCT in the last 72 hours. Sepsis Labs: No results for input(s): PROCALCITON, LATICACIDVEN in the last 168 hours.  No results found for this or any previous visit (from the past 240 hours).   Radiology Studies: US  THORACENTESIS ASP PLEURAL SPACE W/IMG GUIDE Result Date: 08/09/2024 EXAM: ULTRASOUND GUIDED RIGHT THORACENTESIS MEDICATIONS: 15 ml 1% lidocaine  COMPLICATIONS: SIR Level A - No therapy, no consequence. During procedure, patient experienced vagal response. BP dropped to 76/50 and patient reported feeling anxious and dizzy. HR 85, spO2 98% at that time. Catheter was removed and patient was assisted to lie down. After lying down, patient experienced relief and BP improved to 131/76. PROCEDURE: An ultrasound guided thoracentesis was thoroughly discussed with the patient and questions answered. The benefits, risks, alternatives and complications were also discussed. The patient understands and wishes to proceed with the procedure. Written consent was obtained. Ultrasound was performed to localize and mark an adequate pocket of fluid  in the right chest. The area was then prepped and draped in the normal sterile fashion. 1% Lidocaine  was used for local anesthesia. Under ultrasound guidance a 6 Fr Safe-T-Centesis catheter was introduced. Thoracentesis was performed. The catheter was removed and a dressing applied. FINDINGS: A total of approximately 100 milliliters of amber fluid was removed. Samples were sent to the laboratory as requested by the clinical team. IMPRESSION: Successful ultrasound guided right thoracentesis yielding 100 milliliters of pleural fluid. Performed by Laymon Coast, NP under the supervision of Dr. Jenna. Electronically Signed   By: Cordella Jenna   On: 08/09/2024 11:25   DG Chest Port 1 View Result Date: 08/09/2024 EXAM: 1 VIEW(S) XRAY OF THE CHEST 08/09/2024 09:33:18 AM COMPARISON: 08/07/2024 CLINICAL HISTORY: Status post thoracentesis FINDINGS: LUNGS AND PLEURA: Slight decrease in moderate right pleural effusion. Similar airspace opacity at the right lung base. No pneumothorax. HEART AND MEDIASTINUM: No acute abnormality of the cardiac and mediastinal silhouettes. BONES AND SOFT TISSUES: No acute osseous abnormality. IMPRESSION: 1. Slight decrease in moderate right pleural effusion with similar right basilar airspace opacity. Electronically signed by: Norman Gatlin MD 08/09/2024 09:47 AM EST RP Workstation: [id number]    Scheduled Meds:  allopurinol   300 mg Oral Daily   atorvastatin   40 mg Oral Daily   doxazosin   4 mg Oral QHS   melatonin  5 mg Oral QHS   pantoprazole  (PROTONIX ) IV  40 mg Intravenous Q12H   senna-docusate  1 tablet Oral BID   sodium chloride  flush  3 mL Intravenous Q12H   sodium chloride  flush  3 mL Intravenous Q12H   spironolactone   25 mg Oral Daily   Continuous Infusions:     LOS: 2 days   Fredia Skeeter, MD [facility]  08/11/2024, 7:52 AM   *Please note that this is a verbal dictation therefore any spelling or grammatical errors are due to the Dragon Medical  One system interpretation.  Please page via Amion and do not message via secure chat for urgent patient care matters. Secure chat can be used for non urgent patient care matters.  How to contact the [facility] Attending or Consulting provider 7A - 7P or covering provider during after hours 7P -7A, for this patient?  Check the care team in [facility] and look for a) attending/consulting [facility] provider listed and b) the [facility] team listed. Page or secure chat 7A-7P. Log into [email or web address] and use [place]'s  universal password to access. If you do not have the password, please contact the hospital operator. Locate the [facility] provider you are looking for under [facility] and page to a number that you can be directly reached. If you still have difficulty reaching the provider, please page the [facility] (Director on Call) for the Hospitalists listed on amion for assistance.  "

## 2024-08-11 NOTE — Progress Notes (Signed)
 Physical Therapy Treatment  Patient Details Name: Rick Edwards MRN: [id number] DOB: [birth year] Today's Date: 08/11/2024   History of Present Illness 69 year old male Presented to ED 08/07/24 with chief complaint of black-colored stool. +GIB, R pleural effusion, R renal mass (plan to follow-up as OP), 1/3 thoracentesis with vagal episode with BP 76/50. 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 Was able to perform transfers and ambulation with gross CGA and no AD. Pt motivated to participate however limited by symptomatic orthostatic hypotension. Pt without complaints for most of gait training, however reports dizziness at end of gait training and BP 84/58. Recovered in sitting with light exercise up to 115/79 and attempted standing activity again. Pt unable to remain standing for full BP so taken in sitting, down to 76/58 and activity was terminated. Returned pt to supine for recovery, 133/87 at end of session. Will continue to follow and progress as able per POC.    If plan is discharge home, recommend the following: Assistance with cooking/housework;Assist for transportation;Help with stairs or ramp for entrance;A little help with walking and/or transfers   Can travel by private vehicle        Equipment Recommendations  None recommended by PT    Recommendations for Other Services       Precautions / Restrictions Precautions Precautions: Fall Recall of Precautions/Restrictions: Intact Precaution/Restrictions Comments: orthostasis Restrictions Weight Bearing Restrictions Per Provider Order: No     Mobility  Bed Mobility Overal bed mobility: Modified Independent             General bed mobility comments: HOB elevated, no rails. Able to scoot himself up to [facility] at end of session.     Transfers Overall transfer level: Needs assistance Equipment used: None Transfers: Sit to/from Stand Sit to Stand: Contact guard assist           General transfer comment: Pt able to stand without assistance however hands on guarding provided for safety.    Ambulation/Gait Ambulation/Gait assistance: Contact guard assist Gait Distance (Feet): 200 Feet Assistive device: None Gait Pattern/deviations: Step-through pattern, Decreased stride length, Trunk flexed Gait velocity: Decreased Gait velocity interpretation: 1.31 - 2.62 ft/sec, indicative of limited community ambulator   General Gait Details: Slow and mildly unsteady but without need for physical assist. Hands on guarding provided for safety. Pt without complaints for most of gait training, however reports dizziness at end of gait training and BP 84/58.   Stairs             Wheelchair Mobility     Tilt Bed    Modified Rankin (Stroke Patients Only)       Balance Overall balance assessment: Mild deficits observed, not formally tested                                          Communication Communication Communication: Impaired Factors Affecting Communication: Hearing impaired  Cognition Arousal: Alert Behavior During Therapy: Flat affect   PT - Cognitive impairments: No apparent impairments                         Following commands: Intact      Cueing Cueing Techniques: Verbal cues  Exercises General Exercises - Lower Extremity  Long Arc Quad: 10 reps, Both, AROM, Seated Hip ABduction/ADduction: 10 reps, Strengthening, Seated, Both (Isometric) Other Exercises Other Exercises: Standing hip extension x10 each side while standing at the sink after washing hands.    General Comments        Pertinent Vitals/Pain Pain Assessment Pain Assessment: No/denies pain Breathing: normal Negative Vocalization: none Facial Expression: smiling or inexpressive Body Language:  relaxed Consolability: no need to console PAINAD Score: 0    Home Living                          Prior Function            PT Goals (current goals can now be found in the care plan section) Acute Rehab PT Goals Patient Stated Goal: Be able to go home PT Goal Formulation: With patient Time For Goal Achievement: 08/23/24 Potential to Achieve Goals: Good Progress towards PT goals: Progressing toward goals    Frequency    Min 2X/week      PT Plan      Co-evaluation              AM-PAC PT 6 Clicks Mobility   Outcome Measure  Help needed turning from your back to your side while in a flat bed without using bedrails?: None Help needed moving from lying on your back to sitting on the side of a flat bed without using bedrails?: None Help needed moving to and from a bed to a chair (including a wheelchair)?: A Little Help needed standing up from a chair using your arms (e.g., wheelchair or bedside chair)?: A Little Help needed to walk in hospital room?: A Little Help needed climbing 3-5 steps with a railing? : Total 6 Click Score: 18    End of Session Equipment Utilized During Treatment: Gait belt Activity Tolerance: Treatment limited secondary to medical complications (Comment) (symptomatic orthostasis) Patient left: in bed;with call bell/phone within reach;with bed alarm set;with family/visitor present Nurse Communication: Mobility status (BP status) PT Visit Diagnosis: Difficulty in walking, not elsewhere classified (R26.2);Dizziness and giddiness (R42)     Time: 8641-8574 PT Time Calculation (min) (ACUTE ONLY): 27 min  Charges:    $Gait Training: 8-22 mins $Therapeutic Exercise: 8-22 mins PT General Charges $$ ACUTE PT VISIT: 1 Visit                     Leita Sable, PT, DPT Acute Rehabilitation Services Secure Chat Preferred Office: [phone]    [name] 08/11/2024, 2:37 PM

## 2024-08-11 NOTE — Transfer of Care (Signed)
 Immediate Anesthesia Transfer of Care Note  Patient: Rick Edwards  Procedure(s) Performed: EGD (ESOPHAGOGASTRODUODENOSCOPY) CONTROL OF HEMORRHAGE, GI TRACT, ENDOSCOPIC, BY HEMOSTATIC POWDER APPLICATION  Patient Location: PACU and Endoscopy Unit  Anesthesia Type:MAC  Level of Consciousness: drowsy  Airway & Oxygen Therapy: Patient Spontanous Breathing and Patient connected to face mask oxygen  Post-op Assessment: Report given to RN and Post -op Vital signs reviewed and stable  Post vital signs: Reviewed and stable  Last Vitals:  Vitals Value Taken Time  BP 104/61 08/11/24 10:21  Temp    Pulse 96 08/11/24 10:21  Resp 21 08/11/24 10:21  SpO2 98 % 08/11/24 10:21  Vitals shown include unfiled device data.  Last Pain:  Vitals:   08/11/24 1020  TempSrc: Temporal  PainSc: Asleep      Patients Stated Pain Goal: 0 (08/11/24 0123)  Complications: No notable events documented.

## 2024-08-11 NOTE — Anesthesia Postprocedure Evaluation (Signed)
"   Anesthesia Post Note  Patient: Rick Edwards  Procedure(s) Performed: EGD (ESOPHAGOGASTRODUODENOSCOPY) CONTROL OF HEMORRHAGE, GI TRACT, ENDOSCOPIC, BY HEMOSTATIC POWDER APPLICATION     Patient location during evaluation: PACU Anesthesia Type: MAC Level of consciousness: awake and alert and oriented Pain management: pain level controlled Vital Signs Assessment: post-procedure vital signs reviewed and stable Respiratory status: spontaneous breathing, nonlabored ventilation and respiratory function stable Cardiovascular status: stable and blood pressure returned to baseline Postop Assessment: no apparent nausea or vomiting Anesthetic complications: no   No notable events documented.  Last Vitals:  Vitals:   08/11/24 1020 08/11/24 1030  BP: 104/61 110/65  Pulse: 95 98  Resp: 20 18  Temp: (!) 36.1 C   SpO2: 99% 98%    Last Pain:  Vitals:   08/11/24 1030  TempSrc:   PainSc: Asleep                 [name] A.      "

## 2024-08-12 ENCOUNTER — Encounter (HOSPITAL_COMMUNITY): Payer: Self-pay | Admitting: Gastroenterology

## 2024-08-12 DIAGNOSIS — K922 Gastrointestinal hemorrhage, unspecified: Secondary | ICD-10-CM | POA: Diagnosis not present

## 2024-08-12 LAB — GLUCOSE, CAPILLARY
Glucose-Capillary: 85 mg/dL (ref 70–99)
Glucose-Capillary: 102 mg/dL — ABNORMAL HIGH (ref 70–99)
Glucose-Capillary: 110 mg/dL — ABNORMAL HIGH (ref 70–99)

## 2024-08-12 LAB — CBC
HCT: 29.4 % — ABNORMAL LOW (ref 39.0–52.0)
Hemoglobin: 10.1 g/dL — ABNORMAL LOW (ref 13.0–17.0)
MCH: 31.7 pg (ref 26.0–34.0)
MCHC: 34.4 g/dL (ref 30.0–36.0)
MCV: 92.2 fL (ref 80.0–100.0)
Platelets: 278 K/uL (ref 150–400)
RBC: 3.19 MIL/uL — ABNORMAL LOW (ref 4.22–5.81)
RDW: 13.6 % (ref 11.5–15.5)
WBC: 8 K/uL (ref 4.0–10.5)
nRBC: 0 % (ref 0.0–0.2)

## 2024-08-12 LAB — HEPARIN LEVEL (UNFRACTIONATED)
Heparin Unfractionated: 0.88 [IU]/mL — ABNORMAL HIGH (ref 0.30–0.70)
Heparin Unfractionated: 0.6 [IU]/mL (ref 0.30–0.70)

## 2024-08-12 MED ORDER — HYDROMORPHONE HCL 1 MG/ML IJ SOLN
0.5000 mg | INTRAMUSCULAR | Status: AC | PRN
  Administered 2024-08-12 – 2024-08-13 (×2): 1 mg via INTRAVENOUS
  Filled 2024-08-12: qty 1

## 2024-08-12 MED ORDER — HYDROMORPHONE HCL 1 MG/ML IJ SOLN: 0.5000 mg | INTRAMUSCULAR | Status: DC | PRN

## 2024-08-12 MED ORDER — ALUM & MAG HYDROXIDE-SIMETH 200-200-20 MG/5ML PO SUSP
30.0000 mL | Freq: Four times a day (QID) | ORAL | Status: DC | PRN
  Administered 2024-08-12: 30 mL via ORAL
  Filled 2024-08-12: qty 30

## 2024-08-12 NOTE — Plan of Care (Signed)
  Problem: Education: Goal: Knowledge of General Education information will improve Description: Including pain rating scale, medication(s)/side effects and non-pharmacologic comfort measures Outcome: Progressing   Problem: Clinical Measurements: Goal: Will remain free from infection Outcome: Progressing Goal: Respiratory complications will improve Outcome: Progressing Goal: Cardiovascular complication will be avoided Outcome: Progressing   Problem: Pain Managment: Goal: General experience of comfort will improve and/or be controlled Outcome: Progressing

## 2024-08-12 NOTE — Progress Notes (Signed)
 PHARMACY - ANTICOAGULATION CONSULT NOTE  Pharmacy Consult for heparin  Indication: antithrombin III  deficiency and Hx DVT; holding home warfarin  Allergies[1]  Patient Measurements: Height: 5' 9 (175.3 cm) Weight: 88.1 kg (194 lb 3.6 oz) IBW/kg (Calculated) : 70.7 HEPARIN  DW (KG): 87.3  Vital Signs: Temp: 98.1 F (36.7 C) (01/06 1548) BP: 124/84 (01/06 1548) Pulse Rate: 93 (01/06 1548)  Labs: Recent Labs    08/10/24 9372 08/10/24 9372 08/10/24 1648 08/11/24 0224 08/11/24 2208 08/12/24 0509 08/12/24 1616  HGB 10.3*  --  10.8* 10.2*  --  10.1*  --   HCT 30.1*  --  32.1* 30.1*  --  29.4*  --   PLT 274  --  307 284  --  278  --   LABPROT 20.4*  --   --  18.2*  --   --   --   INR 1.6*  --   --  1.4*  --   --   --   HEPARINUNFRC  --    < > 1.02* >1.10* 0.61 0.88* 0.60  CREATININE 0.91  --   --   --   --   --   --    < > = values in this interval not displayed.    Estimated Creatinine Clearance: 87.8 mL/min (by C-G formula based on SCr of 0.91 mg/dL).   Medical History: Past Medical History:  Diagnosis Date   Anemia    anemia thrombocytopenia, saw Hematology 2011, can not r/o myeloproliferative d/c   Antithrombin III  deficiency 05/16/2013   On coumadin  for this   Blood clot in vein    in right leg   Chronic kidney disease 05/27/2013   ACUTE RENAL FAILURE    Crohn's disease (HCC)    tx. Imuran    GI bleed 6/11   w/ normal EGD 6/11, 3 PRBCs    Hearing loss    wears hearing aid left ear; birthed with hearing loss   HOH (hard of hearing) 05/16/2013   Hypercalcemia    Hyperkalemia 05/27/2013   Hypertension    Ileostomy in place [facility]) 04-11-13   04-18-13 ileostomy to be taken down.   Perianal abscess 2001   s/p right hemicolectomy, and drainage of retroperitoneal abcess 2003   Peripheral vascular disease    Transfusion history    last 8'13    Medications:  Scheduled:   allopurinol   300 mg Oral Daily   atorvastatin   40 mg Oral Daily   doxazosin   4 mg Oral QHS    melatonin  5 mg Oral QHS   pantoprazole  (PROTONIX ) IV  40 mg Intravenous Q6H   senna-docusate  1 tablet Oral BID   sodium chloride  flush  3 mL Intravenous Q12H   sodium chloride  flush  3 mL Intravenous Q12H   spironolactone   25 mg Oral Daily    Assessment: Patient is a 69 YO male admitted with melena/suspected GI bleeding. Patient on warfarin PTA. Pharmacy consulted to dose heparin  while warfarin on hold.  Patient is s/p EGD 1/5 with bleeding duodenal ulcer noted.  GI recs holding warfarin x 1 week.  OK to restart heparin  4 hours after procedure and monitor for rebleeding.  Heparin  level came back at just above the mod goal. No bleeding per RN. We will decrease rate and check in AM.   Goal of Therapy:  Heparin  level 0.3-0.5 units/ml Monitor platelets by anticoagulation protocol: Yes   Plan:  Decrease heparin  to 750 units/hr Check heparin  level in Am Heparin  level and CBC  daily while on heparin   Sergio Batch, PharmD, BCIDP, AAHIVP, CPP Infectious Disease Pharmacist 08/12/2024 6:23 PM            [1]  Allergies Allergen Reactions   Olmesartan      Hx of severe AKI requiring HD on ACE per renal note, hx of AKI and hypotension with olmesartan  07/2023   Mesalamine Rash    REACTION: Rash

## 2024-08-12 NOTE — Progress Notes (Signed)
 [facility] Gastroenterology Progress Note  Rick Edwards 69 y.o. March 15, 1956   Subjective: Had RUQ pain last night that is minimal now. Denies N/V.  Objective: Vital signs: Vitals:   08/12/24 0744 08/12/24 1118  BP: (!) 142/89 (!) 141/94  Pulse: 99 (!) 104  Resp: 19 19  Temp: 98.6 F (37 C) 98.9 F (37.2 C)  SpO2: 96% 96%    Physical Exam: Gen: alert, no acute distress, well-nourished, pleasant HEENT: anicteric sclera CV: RRR Chest: CTA B Abd: RUQ tenderness with guarding, soft, nondistended, +BS Ext: no edema  Lab Results: Recent Labs    08/10/24 0627  NA 140  K 4.0  CL 105  CO2 26  GLUCOSE 107*  BUN 16  CREATININE 0.91  CALCIUM  8.6*   No results for input(s): AST, ALT, ALKPHOS, BILITOT, PROT, ALBUMIN  in the last 72 hours. Recent Labs    08/11/24 0224 08/12/24 0509  WBC 8.6 8.0  HGB 10.2* 10.1*  HCT 30.1* 29.4*  MCV 92.0 92.2  PLT 284 278      Assessment/Plan: Duodenal ulcer bleed - check H. Pylori serology and treat if positive. Clear liquid diet today and slowly advance tomorrow if ok. IV PPI Q 6 hours until tomorrow and if stable then change to Q 12 hours. Supportive care.    [name] 08/12/2024, 11:25 AM  Questions please call [phone] ID: Rick Edwards, male   DOB: 20-Jan-1956, 69 y.o.   MRN: [id number]

## 2024-08-12 NOTE — Progress Notes (Signed)
 PHARMACY - ANTICOAGULATION CONSULT NOTE  Pharmacy Consult for heparin  Indication: antithrombin III  deficiency and Hx DVT; holding home warfarin  Allergies[1]  Patient Measurements: Height: 5' 9 (175.3 cm) Weight: 88.1 kg (194 lb 3.6 oz) IBW/kg (Calculated) : 70.7 HEPARIN  DW (KG): 87.3  Vital Signs: Temp: 98.6 F (37 C) (01/06 0744) Temp Source: Oral (01/06 0533) BP: 142/89 (01/06 0744) Pulse Rate: 99 (01/06 0744)  Labs: Recent Labs    08/09/24 0830 08/09/24 1751 08/10/24 9372 08/10/24 0627 08/10/24 1648 08/11/24 0224 08/11/24 2208 08/12/24 0509  HGB 11.0*   < > 10.3*  --  10.8* 10.2*  --  10.1*  HCT 32.2*   < > 30.1*  --  32.1* 30.1*  --  29.4*  PLT 274   < > 274  --  307 284  --  278  APTT 41*  --   --   --   --   --   --   --   LABPROT 35.4*  --  20.4*  --   --  18.2*  --   --   INR 3.3*  --  1.6*  --   --  1.4*  --   --   HEPARINUNFRC  --   --   --    < > 1.02* >1.10* 0.61 0.88*  CREATININE 0.92  --  0.91  --   --   --   --   --    < > = values in this interval not displayed.    Estimated Creatinine Clearance: 87.8 mL/min (by C-G formula based on SCr of 0.91 mg/dL).   Medical History: Past Medical History:  Diagnosis Date   Anemia    anemia thrombocytopenia, saw Hematology 2011, can not r/o myeloproliferative d/c   Antithrombin III  deficiency 05/16/2013   On coumadin  for this   Blood clot in vein    in right leg   Chronic kidney disease 05/27/2013   ACUTE RENAL FAILURE    Crohn's disease (HCC)    tx. Imuran    GI bleed 6/11   w/ normal EGD 6/11, 3 PRBCs    Hearing loss    wears hearing aid left ear; birthed with hearing loss   HOH (hard of hearing) 05/16/2013   Hypercalcemia    Hyperkalemia 05/27/2013   Hypertension    Ileostomy in place [facility]) 04-11-13   04-18-13 ileostomy to be taken down.   Perianal abscess 2001   s/p right hemicolectomy, and drainage of retroperitoneal abcess 2003   Peripheral vascular disease    Transfusion history    last  8'13    Medications:  Scheduled:   allopurinol   300 mg Oral Daily   atorvastatin   40 mg Oral Daily   doxazosin   4 mg Oral QHS   melatonin  5 mg Oral QHS   pantoprazole  (PROTONIX ) IV  40 mg Intravenous Q6H   senna-docusate  1 tablet Oral BID   sodium chloride  flush  3 mL Intravenous Q12H   sodium chloride  flush  3 mL Intravenous Q12H   spironolactone   25 mg Oral Daily    Assessment: Patient is a 69 YO male admitted with melena/suspected GI bleeding. Patient on warfarin PTA. Pharmacy consulted to dose heparin  while warfarin on hold.  Patient is s/p EGD 1/5 with bleeding duodenal ulcer noted.  GI recs holding warfarin x 1 week.  OK to restart heparin  4 hours after procedure and monitor for rebleeding.  Patient supratherapeutic on heparin  at 1000 units/hr.  Will target lower  end of goal range given risk for rebleed.  No bleeding noted.  Hgb stable 10.2.  Goal of Therapy:  Heparin  level 0.3-0.5 units/ml Monitor platelets by anticoagulation protocol: Yes   Plan:  Decrease heparin  to 850 units/hr Check heparin  level in 8 hours Heparin  level and CBC daily while on heparin   [employer], Pharm.D., BCPS Clinical Pharmacist Clinical phone for 08/12/2024 from 7:30-3:00 is [phone].  **Pharmacist phone directory can be found on [email or web address] listed under [facility].  08/12/2024 7:51 AM          [1]  Allergies Allergen Reactions   Olmesartan      Hx of severe AKI requiring HD on ACE per renal note, hx of AKI and hypotension with olmesartan  07/2023   Mesalamine Rash    REACTION: Rash

## 2024-08-12 NOTE — Plan of Care (Signed)
  Problem: Education: Goal: Knowledge of General Education information will improve Description: Including pain rating scale, medication(s)/side effects and non-pharmacologic comfort measures Outcome: Not Progressing   Problem: Health Behavior/Discharge Planning: Goal: Ability to manage health-related needs will improve Outcome: Not Progressing   Problem: Clinical Measurements: Goal: Ability to maintain clinical measurements within normal limits will improve Outcome: Not Progressing Goal: Will remain free from infection Outcome: Not Progressing Goal: Diagnostic test results will improve Outcome: Not Progressing Goal: Respiratory complications will improve Outcome: Not Progressing Goal: Cardiovascular complication will be avoided Outcome: Not Progressing   Problem: Nutrition: Goal: Adequate nutrition will be maintained Outcome: Not Progressing   Problem: Coping: Goal: Level of anxiety will decrease Outcome: Not Progressing   Problem: Safety: Goal: Ability to remain free from injury will improve Outcome: Not Progressing   Problem: Skin Integrity: Goal: Risk for impaired skin integrity will decrease Outcome: Not Progressing

## 2024-08-12 NOTE — Progress Notes (Signed)
 " PROGRESS NOTE    Rick Edwards  FMW:[id number] DOB: 07-01-1956 DOA: 08/07/2024 PCP: Joshua Debby CROME, MD   Brief Narrative:  Rick Edwards is a 69 year old male with extensive history of Antithrombin III  deficiency chronically on Coumadin , CKD, Crohn's disease, chronic anemia, hypercalcemic hyperkalemia HTN, ileostomy/takedown 2014, PVD,... Presented to ED with chief complaint of black-colored stool and shortness of breath and was found to have moderate size right pleural effusion and possible GI bleed.  GI consulted.  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  FOBT ordered but is still pending.  Seen by GI, status post EGD 08/12/2023, was found to have oozing duodenal ulcer which was stopped by hemostatic spray.  GI recommended to continue to keep him n.p.o. with ice chips and increase frequency of IV PPI to every 6 hours.  Also recommended to resume heparin  after 4 hours.  Hemoglobin is stable today.  Management per GI.  Moderate right pleural effusion: This is recurrent issue for him.  Underwent thoracentesis by IR 08/09/2024 with a yield of 100 cc fluid.  Fluid analysis consistent with lymphocyte predominant exudative fluid.  No LDH on chart, I am checking LDH.  Patient has no history of weight loss and no travel to TB Dominant countries either so TB remains at the bottom of the suspicion.   Malignancy is another differential with this kind of fluid analysis but patient denies any weight loss.  Will await final cytology report which is still pending.  Antithrombin 3 deficiency/supratherapeutic INR: Chronically anticoagulated on Coumadin , INR 2.7 upon arrival.  Received vitamin K  for reversal.  Currently remain on heparin .  GI prohibits from resuming Coumadin  for at least 1 week/until 08/18/2024.  Right renal mass: Multiple bilateral renal cysts, 1 including 2.1 cm right kidney possible RCC On a CT scan-EDP Dr. Elnor has discussed the findings with on-call urologist Does not recommend any inpatient further workup -to follow-up as an outpatient.  Type 2 diabetes mellitus: Last hemoglobin A1c 6.2, currently not on any medications.  Stage III CKD: At baseline.  History of chron's disease: Stable.  PVD: Continue statins.  Dyslipidemia: Continue statin.  DVT prophylaxis: SCDs Start: 08/08/24 1636 Place TED hose Start: 08/08/24 1636   Code Status: Full Code  Family Communication: None present at bedside.  Plan of care discussed with patient in length and he/she verbalized understanding and agreed with it.  Status is: Inpatient Remains inpatient appropriate because: Per GI, needs to remain in the hospital for gradual advancement of the diet and monitoring     Estimated body mass index is 28.68 kg/m as calculated from the following:   Height as of this encounter: 5' 9 (1.753 m).   Weight as of this encounter: 88.1 kg.    Nutritional Assessment: Body mass index is 28.68 kg/m.[name] Seen by dietician.  I agree with the assessment and plan as outlined below: Nutrition Status:        .  Skin Assessment: I have examined the patient's skin and I agree with the wound assessment as performed by the wound care RN as outlined below:    Consultants:  GI  Procedures:  None none  Antimicrobials:  Anti-infectives (From admission, onward)    None          Subjective: Patient seen and examined.  He tells me that he had some abdominal pain which he points to the right upper quadrant, last night.  However this morning he was feeling well.  No nausea or vomiting.  No other complaint.  I discussed with GI, they are okay with him starting on clear liquid diet.  Objective: Vitals:   08/11/24 2025 08/12/24 0008 08/12/24 0533 08/12/24 0744  BP: 138/82 122/73 132/86 (!) 142/89  Pulse: 96 96 95 99  Resp: 20 18 18 19   Temp: 99.5 F (37.5 C) 98.5 F (36.9 C) 98.3 F (36.8 C) 98.6 F (37 C)  TempSrc: Oral Oral Oral   SpO2: 95% 100% 92% 96%  Weight:   88.1 kg   Height:        Intake/Output Summary (Last 24 hours) at 08/12/2024 0746 Last data filed at 08/12/2024 0700 Gross per 24 hour  Intake 460.97 ml  Output 500 ml  Net -39.03 ml   Filed Weights   08/11/24 0432 08/11/24 0907 08/12/24 0533  Weight: 87.3 kg 87.3 kg 88.1 kg    Examination:  General exam: Appears calm and comfortable  Respiratory system: Clear to auscultation. Respiratory effort normal. Cardiovascular system: S1 & S2 heard, RRR. No JVD, murmurs, rubs, gallops or clicks. No pedal edema. Gastrointestinal system: Abdomen is nondistended, soft and nontender. No organomegaly or masses felt. Normal bowel sounds heard. Central nervous system: Alert and oriented. No focal neurological deficits. Extremities: Symmetric 5 x 5 power. Skin: No rashes, lesions or ulcers.  Psychiatry: Judgement and insight appear normal. Mood & affect appropriate.    Data Reviewed: I have personally reviewed following labs and imaging studies  CBC: Recent Labs  Lab 08/07/24 1352 08/08/24 1037 08/09/24 1751 08/10/24 0627 08/10/24 1648 08/11/24 0224 08/12/24 0509  WBC 8.5   < > 8.7 7.9 9.2 8.6 8.0  NEUTROABS 5.9  --   --   --   --   --   --   HGB 13.2   < > 10.6* 10.3* 10.8* 10.2* 10.1*  HCT 39.5   < > 31.0* 30.1* 32.1* 30.1* 29.4*  MCV 92.1   < > 91.7 90.4 92.8 92.0 92.2  PLT 317   < >  290 274 307 284 278   < > = values in this interval not displayed.   Basic Metabolic Panel: Recent Labs  Lab 08/07/24 1352 08/08/24 1037 08/09/24 0830 08/10/24 0627  NA 138 136 139 140  K 4.2 4.1 4.0 4.0  CL 101 102 104 105  CO2 27 25 25 26   GLUCOSE 104* 106* 105* 107*  BUN 25* 36* 23 16  CREATININE 0.89 0.95 0.92 0.91  CALCIUM  9.4 9.0 8.7* 8.6*  MG  --   --  1.6*  --   PHOS  --   --  3.6  --    GFR: Estimated Creatinine Clearance: 87.8 mL/min (by C-G formula based on SCr of 0.91 mg/dL). Liver Function Tests: Recent Labs  Lab 08/07/24 1352  AST 17  ALT 27  ALKPHOS 87  BILITOT 0.9  PROT 6.6  ALBUMIN  3.8   No results for input(s): LIPASE, AMYLASE in the last 168  hours. No results for input(s): AMMONIA in the last 168 hours. Coagulation Profile: Recent Labs  Lab 08/08/24 1037 08/09/24 0830 08/10/24 0627 08/11/24 0224  INR 2.7* 3.3* 1.6* 1.4*   Cardiac Enzymes: No results for input(s): CKTOTAL, CKMB, CKMBINDEX, TROPONINI in the last 168 hours. BNP (last 3 results) Recent Labs    08/08/24 1227  PROBNP <50.0   HbA1C: No results for input(s): HGBA1C in the last 72 hours. CBG: Recent Labs  Lab 08/10/24 0644 08/10/24 1232 08/10/24 1647 08/11/24 0752 08/12/24 0720  GLUCAP 94 110* 106* 100* 85   Lipid Profile: No results for input(s): CHOL, HDL, LDLCALC, TRIG, CHOLHDL, LDLDIRECT in the last 72 hours. Thyroid  Function Tests: No results for input(s): TSH, T4TOTAL, FREET4, T3FREE, THYROIDAB in the last 72 hours. Anemia Panel: No results for input(s): VITAMINB12, FOLATE, FERRITIN, TIBC, IRON, RETICCTPCT in the last 72 hours. Sepsis Labs: No results for input(s): PROCALCITON, LATICACIDVEN in the last 168 hours.  No results found for this or any previous visit (from the past 240 hours).   Radiology Studies: No results found.   Scheduled Meds:  allopurinol   300 mg Oral Daily   atorvastatin   40 mg  Oral Daily   doxazosin   4 mg Oral QHS   melatonin  5 mg Oral QHS   pantoprazole  (PROTONIX ) IV  40 mg Intravenous Q6H   senna-docusate  1 tablet Oral BID   sodium chloride  flush  3 mL Intravenous Q12H   sodium chloride  flush  3 mL Intravenous Q12H   spironolactone   25 mg Oral Daily   Continuous Infusions:  heparin  1,000 Units/hr (08/11/24 1441)      LOS: 3 days   Fredia Skeeter, MD [facility]  08/12/2024, 7:46 AM   *Please note that this is a verbal dictation therefore any spelling or grammatical errors are due to the Dragon Medical One system interpretation.  Please page via Amion and do not message via secure chat for urgent patient care matters. Secure chat can be used for non urgent patient care matters.  How to contact the [facility] Attending or Consulting provider 7A - 7P or covering provider during after hours 7P -7A, for this patient?  Check the care team in [facility] and look for a) attending/consulting [facility] provider listed and b) the [facility] team listed. Page or secure chat 7A-7P. Log into [email or web address] and use [place]'s universal password to access. If you do not have the password, please contact the hospital operator. Locate the [facility] provider you are looking for under [facility] and page to a number that you can be directly reached. If you still have difficulty reaching the provider, please page the [facility] (Director on Call) for the Hospitalists listed on amion for assistance.  "

## 2024-08-13 ENCOUNTER — Other Ambulatory Visit: Payer: Self-pay | Admitting: Internal Medicine

## 2024-08-13 DIAGNOSIS — K922 Gastrointestinal hemorrhage, unspecified: Secondary | ICD-10-CM | POA: Diagnosis not present

## 2024-08-13 DIAGNOSIS — J90 Pleural effusion, not elsewhere classified: Secondary | ICD-10-CM

## 2024-08-13 DIAGNOSIS — E781 Pure hyperglyceridemia: Secondary | ICD-10-CM

## 2024-08-13 DIAGNOSIS — K264 Chronic or unspecified duodenal ulcer with hemorrhage: Principal | ICD-10-CM

## 2024-08-13 DIAGNOSIS — Z7901 Long term (current) use of anticoagulants: Secondary | ICD-10-CM | POA: Diagnosis not present

## 2024-08-13 DIAGNOSIS — R911 Solitary pulmonary nodule: Secondary | ICD-10-CM | POA: Diagnosis not present

## 2024-08-13 DIAGNOSIS — N2889 Other specified disorders of kidney and ureter: Secondary | ICD-10-CM | POA: Diagnosis not present

## 2024-08-13 LAB — CBC
HCT: 29.2 % — ABNORMAL LOW (ref 39.0–52.0)
Hemoglobin: 10.2 g/dL — ABNORMAL LOW (ref 13.0–17.0)
MCH: 31.3 pg (ref 26.0–34.0)
MCHC: 34.9 g/dL (ref 30.0–36.0)
MCV: 89.6 fL (ref 80.0–100.0)
Platelets: 307 K/uL (ref 150–400)
RBC: 3.26 MIL/uL — ABNORMAL LOW (ref 4.22–5.81)
RDW: 13.9 % (ref 11.5–15.5)
WBC: 6.6 K/uL (ref 4.0–10.5)
nRBC: 0 % (ref 0.0–0.2)

## 2024-08-13 LAB — HEPARIN LEVEL (UNFRACTIONATED)
Heparin Unfractionated: 0.47 [IU]/mL (ref 0.30–0.70)
Heparin Unfractionated: 0.23 [IU]/mL — ABNORMAL LOW (ref 0.30–0.70)

## 2024-08-13 LAB — CYTOLOGY - NON PAP

## 2024-08-13 MED ORDER — PANTOPRAZOLE SODIUM 40 MG IV SOLR
40.0000 mg | Freq: Two times a day (BID) | INTRAVENOUS | Status: DC
  Administered 2024-08-13 – 2024-08-18 (×11): 40 mg via INTRAVENOUS
  Filled 2024-08-13 (×10): qty 10

## 2024-08-13 NOTE — Consult Note (Addendum)
 [facility] CONSULT NOTE  Patient Care Team: Joshua Debby CROME, MD as PCP - General (Internal Medicine) Sheldon Standing, MD as Consulting Physician (General Surgery) [facility], [facility] (Ophthalmology) Merceda Lela SAUNDERS, RPH-CPP (Pharmacist)  CHIEF COMPLAINTS/PURPOSE OF CONSULTATION:  GI bleed as well as pleural effusion. Underwent EGD with oozing duodenal ulcer which was not biopsied. Underwent thoracentesis, cytology shows adenocarcinoma.   REFERRING PHYSICIAN: Dr. Vernon  HISTORY OF PRESENTING ILLNESS:  Rick Edwards 69 y.o. male who presented on 1-20 26 with complaints of blood in his stool x 2 days.  Also complained of shortness of breath for approximately 2 weeks.  Workup was done including imaging, blood work and GI evaluation.  Thoracentesis was done with cytology showing adenocarcinoma.  Therefore oncology evaluation has been requested. Patient seen awake alert and oriented x 3 sitting up in chair at bedside.  Confirms chief complaints as documented above.  Denies nausea, vomiting, abdominal pain, lightheadedness or dizziness.  Admits to shortness of breath prior to thoracentesis.  No other complaints offered. Medical history significant for blood clots for which he is on warfarin that is managed by PCP.  Noted in documentation history of Antithrombin III  deficiency, anemia, and CKD. Surgical history significant for abdominal surgery over 25 years ago for Crohn's.  Also had right foot amputation due to blood clots. Family history includes father with pancreatic cancer. Social history is noncontributory.  Denies tobacco use, alcohol use, and drug use.  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    I have reviewed his chart and materials related to his cancer extensively and collaborated history with the patient. Summary of oncologic history is as follows: Oncology History   No problem history exists.    ASSESSMENT  & PLAN:  Adenocarcinoma of pleural fluid Pleural effusion, right Shortness of breath Left upper lobe lesion - Patient complained of shortness of breath x 1 week prior to admission - Imaging done 08/08/2024 showed moderate size right pleural effusion.  Thoracentesis was performed.  Cytology confirmed adenocarcinoma of pleural fluid. - There is 1.4 x 1.1 cm left upper lobe lesion concerning for malignancy. - Due to difficulty with obtaining biopsy of duodenal ulcer per GI, evaluate for biopsy of LUL lesion if possible. - CEA pending. - Medical oncology/Dr. Federico will make further recommendations.  GI bleeding/rectal bleeding Bleeding duodenal ulcer - Patient reported 2-day history of rectal bleeding when he was admitted. - Evaluated by GI.  Endoscopy done 08/11/2024 with bleeding duodenal ulcer noted.  This was not biopsied due to concern for further bleeding.  Multiple bilateral renal cystic lesions, right kidney - Imaging also showed multiple bilateral renal cystic lesions arising from right kidney described as possible renal cell carcinoma. - Recommend urology eval  History of ATIII deficiency - Has been on Coumadin .  Now on hold due to GI bleed - Medicine managing  History of Crohn's disease - Status post abdominal surgery 25 years ago   MEDICAL HISTORY:  Past Medical History:  Diagnosis Date   Anemia    anemia thrombocytopenia, saw Hematology 2011, can not r/o myeloproliferative d/c   Antithrombin III  deficiency 05/16/2013   On coumadin  for this   Blood clot in vein    in right leg   Chronic kidney disease 05/27/2013   ACUTE RENAL FAILURE    Crohn's disease (HCC)    tx. Imuran    GI bleed 6/11   w/ normal EGD 6/11, 3 PRBCs    Hearing loss  wears hearing aid left ear; birthed with hearing loss   HOH (hard of hearing) 05/16/2013   Hypercalcemia    Hyperkalemia 05/27/2013   Hypertension    Ileostomy in place [facility]) 04-11-13   04-18-13 ileostomy to be taken down.    Perianal abscess 2001   s/p right hemicolectomy, and drainage of retroperitoneal abcess 2003   Peripheral vascular disease    Transfusion history    last 8'13    SURGICAL HISTORY: Past Surgical History:  Procedure Laterality Date   Anal fistulotomy  2002   EMBOLECTOMY  03/12/2012   Procedure: EMBOLECTOMY;  Surgeon: Lonni GORMAN Blade, MD;  Location: [facility] OR;  Service: Vascular;  Laterality: Right;  Right popliteal embolectomy with vein patch angioplasty, right posterior tibial embolectomy with vein patch angioplasty    ESOPHAGOGASTRODUODENOSCOPY N/A 12/12/2023   Procedure: EGD (ESOPHAGOGASTRODUODENOSCOPY);  Surgeon: Burnette Fallow, MD;  Location: [facility];  Service: Gastroenterology;  Laterality: N/A;   ESOPHAGOGASTRODUODENOSCOPY N/A 08/11/2024   Procedure: EGD (ESOPHAGOGASTRODUODENOSCOPY);  Surgeon: Dianna Specking, MD;  Location: [facility] ENDOSCOPY;  Service: Gastroenterology;  Laterality: N/A;   HEMICOLECTOMY  08/29/2001   perforated abscess   HEMOSTASIS CLIP PLACEMENT  08/11/2024   Procedure: CONTROL OF HEMORRHAGE, GI TRACT, ENDOSCOPIC, BY HEMOSTATIC POWDER APPLICATION;  Surgeon: Dianna Specking, MD;  Location: [facility] ENDOSCOPY;  Service: Gastroenterology;;   ILEOSTOMY CLOSURE N/A 04/18/2013   Procedure: ILEOSTOMY TAKEDOWN;  Surgeon: Morene ONEIDA Olives, MD;  Location: [facility] ORS;  Service: General;  Laterality: N/A;   INTRAOPERATIVE ARTERIOGRAM  03/12/2012   Procedure: INTRA OPERATIVE ARTERIOGRAM;  Surgeon: Lonni GORMAN Blade, MD;  Location: [place] OR;  Service: Vascular;  Laterality: Right;   PARTIAL COLECTOMY  03/11/2012   Procedure: PARTIAL COLECTOMY;  Surgeon: Morene ONEIDA Olives, MD;  Location: [facility] ORS;  Service: General;  Laterality: N/A;  subtotal colectomy transverse and left colon    TRANSMETATARSAL AMPUTATION  05/10/2012   right   UPPER ESOPHAGEAL ENDOSCOPIC ULTRASOUND (EUS) N/A 12/12/2023   Procedure: UPPER ESOPHAGEAL ENDOSCOPIC ULTRASOUND (EUS);  Surgeon: Burnette Fallow, MD;  Location: [facility];  Service: Gastroenterology;  Laterality: N/A;    SOCIAL HISTORY: Social History   Socioeconomic History   Marital status: Single    Spouse name: Not on file   Number of children: Not on file   Years of education: Not on file   Highest education level: Not on file  Occupational History   Occupation: Post office    Occupation: disablity  Tobacco Use   Smoking status: Former    Current packs/day: 0.00    Average packs/day: 0.3 packs/day for 9.0 years (2.3 ttl pk-yrs)    Types: Cigarettes    Start date: 05/08/2002    Quit date: 05/09/2011    Years since quitting: 13.2   Smokeless tobacco: Never  Vaping Use   Vaping status: Never Used  Substance and Sexual Activity   Alcohol use: No    Alcohol/week: 0.0 standard drinks of alcohol   Drug use: Not Currently    Types: Marijuana   Sexual activity: Not Currently  Other Topics Concern   Not on file  Social History Narrative   Lives by himself/ Regular exercise- no    Social Drivers of Health   Tobacco Use: Medium Risk (08/11/2024)   Patient History    Smoking Tobacco Use: Former    Smokeless Tobacco Use: Never    Passive Exposure: Not on file  Financial Resource Strain: Low Risk (06/11/2023)   Overall Financial Resource Strain (CARDIA)    Difficulty of  Paying Living Expenses: Not hard at all  Food Insecurity: No Food Insecurity (08/08/2024)   Epic    Worried About Programme Researcher, Broadcasting/film/video in the Last Year: Never true    Ran Out of Food in the Last Year: Never true  Transportation Needs: No Transportation Needs (08/08/2024)   Epic    Lack of Transportation (Medical): No    Lack of Transportation (Non-Medical): No  Physical Activity: Sufficiently Active (06/12/2024)   Exercise Vital Sign    Days of Exercise per Week: 7 days    Minutes of Exercise per Session: 30 min  Stress: No Stress Concern Present (06/12/2024)   [employer] of Occupational Health - Occupational Stress Questionnaire    Feeling of Stress: Not at all   Social Connections: Moderately Integrated (08/08/2024)   Social Connection and Isolation Panel    Frequency of Communication with Friends and Family: More than three times a week    Frequency of Social Gatherings with Friends and Family: More than three times a week    Attends Religious Services: More than 4 times per year    Active Member of Clubs or Organizations: Yes    Attends Banker Meetings: More than 4 times per year    Marital Status: Never married  Intimate Partner Violence: Unknown (08/08/2024)   Epic    Fear of Current or Ex-Partner: No    Emotionally Abused: No    Physically Abused: Not on file    Sexually Abused: No  Depression (PHQ2-9): Low Risk (06/12/2024)   Depression (PHQ2-9)    PHQ-2 Score: 1  Alcohol Screen: Low Risk (06/11/2023)   Alcohol Screen    Last Alcohol Screening Score (AUDIT): 0  Housing: Low Risk (08/08/2024)   Epic    Unable to Pay for Housing in the Last Year: No    Number of Times Moved in the Last Year: 0    Homeless in the Last Year: No  Utilities: Not At Risk (08/08/2024)   Epic    Threatened with loss of utilities: No  Health Literacy: Adequate Health Literacy (06/12/2024)   B1300 Health Literacy    Frequency of need for help with medical instructions: Never    FAMILY HISTORY: Family History  Problem Relation Age of Onset   Hypertension Mother    Hyperlipidemia Mother    Hypertension Father    Hyperlipidemia Father    Heart disease Father    Stroke Other        GF, aunts    Hypertension Other        the whole family   Coronary artery disease Neg Hx    Diabetes Neg Hx    Colon cancer Neg Hx    Prostate cancer Neg Hx    Kidney failure Neg Hx      PHYSICAL EXAMINATION: ECOG PERFORMANCE STATUS: 1 - Symptomatic but completely ambulatory  Vitals:   08/13/24 1408 08/13/24 1944  BP: 116/79 120/75  Pulse: (!) 102 97  Resp: 16 17  Temp: 99.9 F (37.7 C) 99.2 F (37.3 C)  SpO2: 94% 93%   Filed Weights   08/11/24 0432  08/11/24 0907 08/12/24 0533  Weight: 192 lb 7.4 oz (87.3 kg) 192 lb 7.4 oz (87.3 kg) 194 lb 3.6 oz (88.1 kg)    GENERAL: alert, no distress and comfortable + thin appearing SKIN: skin color, texture, turgor are normal, no rashes or significant lesions EYES: normal, conjunctiva are pink and non-injected, sclera clear OROPHARYNX: no exudate, no erythema and  lips, buccal mucosa, and tongue normal  NECK: supple, thyroid  normal size, non-tender, without nodularity LYMPH: no palpable lymphadenopathy in the cervical, axillary or inguinal LUNGS: clear to auscultation and percussion with normal breathing effort HEART: regular rate & rhythm and no murmurs and no lower extremity edema ABDOMEN: +Abdominal distention with healed midline scar MUSCULOSKELETAL: no cyanosis of digits and no clubbing  PSYCH: alert & oriented x 3 with fluent speech NEURO: no focal motor/sensory deficits   ALLERGIES:  is allergic to olmesartan  and mesalamine.  MEDICATIONS:  Current Facility-Administered Medications  Medication Dose Route Frequency Provider Last Rate Last Admin   acetaminophen  (TYLENOL ) tablet 650 mg  650 mg Oral Q6H PRN Dianna Specking, MD   650 mg at 08/11/24 2114   Or   acetaminophen  (TYLENOL ) suppository 650 mg  650 mg Rectal Q6H PRN Dianna Specking, MD       allopurinol  (ZYLOPRIM ) tablet 300 mg  300 mg Oral Daily Dianna Specking, MD   300 mg at 08/13/24 0904   alum & mag hydroxide-simeth (MAALOX/MYLANTA) 200-200-20 MG/5ML suspension 30 mL  30 mL Oral Q6H PRN Sundil, Subrina, MD   30 mL at 08/12/24 2118   atorvastatin  (LIPITOR) tablet 40 mg  40 mg Oral Daily Dianna Specking, MD   40 mg at 08/13/24 9095   doxazosin  (CARDURA ) tablet 4 mg  4 mg Oral QHS Dianna Specking, MD   4 mg at 08/12/24 2102   heparin  ADULT infusion 100 units/mL (25000 units/250mL)  800 Units/hr Intravenous Continuous Claudene Toribio BROCKS, MD 8 mL/hr at 08/13/24 1308 800 Units/hr at 08/13/24 1308   hydrALAZINE  (APRESOLINE )  injection 10 mg  10 mg Intravenous Q4H PRN Dianna Specking, MD       HYDROmorphone  (DILAUDID ) injection 0.5-1 mg  0.5-1 mg Intravenous Q4H PRN Sundil, Subrina, MD   1 mg at 08/13/24 0455   ipratropium (ATROVENT ) nebulizer solution 0.5 mg  0.5 mg Nebulization Q6H PRN Dianna Specking, MD       melatonin tablet 5 mg  5 mg Oral QHS Dianna Specking, MD   5 mg at 08/12/24 2102   ondansetron  (ZOFRAN ) tablet 4 mg  4 mg Oral Q6H PRN Dianna Specking, MD       Or   ondansetron  (ZOFRAN ) injection 4 mg  4 mg Intravenous Q6H PRN Dianna Specking, MD       oxyCODONE  (Oxy IR/ROXICODONE ) immediate release tablet 5 mg  5 mg Oral Q4H PRN Dianna Specking, MD   5 mg at 08/13/24 0910   pantoprazole  (PROTONIX ) injection 40 mg  40 mg Intravenous Q12H Dianna Specking, MD       senna-docusate (Senokot-S) tablet 1 tablet  1 tablet Oral BID Dianna Specking, MD   1 tablet at 08/13/24 9095   sodium chloride  flush (NS) 0.9 % injection 3 mL  3 mL Intravenous Q12H Dianna Specking, MD   3 mL at 08/12/24 2102   sodium chloride  flush (NS) 0.9 % injection 3 mL  3 mL Intravenous Q12H Dianna Specking, MD   3 mL at 08/13/24 9094   spironolactone  (ALDACTONE ) tablet 25 mg  25 mg Oral Daily Dianna Specking, MD   25 mg at 08/13/24 9095   traZODone  (DESYREL ) tablet 25 mg  25 mg Oral QHS PRN Dianna Specking, MD   25 mg at 08/12/24 2102     LABORATORY DATA:  I have reviewed the data as listed Lab Results  Component Value Date   WBC 6.6 08/13/2024   HGB 10.2 (L) 08/13/2024   HCT 29.2 (  L) 08/13/2024   MCV 89.6 08/13/2024   PLT 307 08/13/2024   Recent Labs    10/24/23 1332 05/13/24 1009 07/18/24 1703 08/07/24 1352 08/08/24 1037 08/09/24 0830 08/10/24 0627  NA 137  --    < > 138 136 139 140  K 3.4*  --    < > 4.2 4.1 4.0 4.0  CL 97  --    < > 101 102 104 105  CO2 33*  --    < > 27 25 25 26   GLUCOSE 100*  --    < > 104* 106* 105* 107*  BUN 13  --    < > 25* 36* 23 16  CREATININE 1.01  --    < > 0.89  0.95 0.92 0.91  CALCIUM  9.3  --    < > 9.4 9.0 8.7* 8.6*  GFRNONAA  --   --    < > >60 >60 >60 >60  PROT 7.5 6.9  --  6.6  --   --   --   ALBUMIN  4.5 4.4  --  3.8  --   --   --   AST 17 14  --  17  --   --   --   ALT 19 22  --  27  --   --   --   ALKPHOS 95 85  --  87  --   --   --   BILITOT 1.1 1.0  --  0.9  --   --   --   BILIDIR 0.2 0.2  --   --   --   --   --    < > = values in this interval not displayed.    RADIOGRAPHIC STUDIES: I have personally reviewed the radiological images as listed and agreed with the findings in the report. US  THORACENTESIS ASP PLEURAL SPACE W/IMG GUIDE Result Date: 08/09/2024 EXAM: ULTRASOUND GUIDED RIGHT THORACENTESIS MEDICATIONS: 15 ml 1% lidocaine  COMPLICATIONS: SIR Level A - No therapy, no consequence. During procedure, patient experienced vagal response. BP dropped to 76/50 and patient reported feeling anxious and dizzy. HR 85, spO2 98% at that time. Catheter was removed and patient was assisted to lie down. After lying down, patient experienced relief and BP improved to 131/76. PROCEDURE: An ultrasound guided thoracentesis was thoroughly discussed with the patient and questions answered. The benefits, risks, alternatives and complications were also discussed. The patient understands and wishes to proceed with the procedure. Written consent was obtained. Ultrasound was performed to localize and mark an adequate pocket of fluid in the right chest. The area was then prepped and draped in the normal sterile fashion. 1% Lidocaine  was used for local anesthesia. Under ultrasound guidance a 6 Fr Safe-T-Centesis catheter was introduced. Thoracentesis was performed. The catheter was removed and a dressing applied. FINDINGS: A total of approximately 100 milliliters of amber fluid was removed. Samples were sent to the laboratory as requested by the clinical team. IMPRESSION: Successful ultrasound guided right thoracentesis yielding 100 milliliters of pleural fluid. Performed  by Laymon Coast, NP under the supervision of Dr. Jenna. Electronically Signed   By: Cordella Jenna   On: 08/09/2024 11:25   DG Chest Port 1 View Result Date: 08/09/2024 EXAM: 1 VIEW(S) XRAY OF THE CHEST 08/09/2024 09:33:18 AM COMPARISON: 08/07/2024 CLINICAL HISTORY: Status post thoracentesis FINDINGS: LUNGS AND PLEURA: Slight decrease in moderate right pleural effusion. Similar airspace opacity at the right lung base. No pneumothorax. HEART AND MEDIASTINUM: No acute abnormality of the  cardiac and mediastinal silhouettes. BONES AND SOFT TISSUES: No acute osseous abnormality. IMPRESSION: 1. Slight decrease in moderate right pleural effusion with similar right basilar airspace opacity. Electronically signed by: Norman Gatlin MD 08/09/2024 09:47 AM EST RP Workstation: [id number]   CT CHEST ABDOMEN PELVIS W CONTRAST Result Date: 08/08/2024 CLINICAL DATA:  Acute generalized abdominal pain. EXAM: CT CHEST, ABDOMEN, AND PELVIS WITH CONTRAST TECHNIQUE: Multidetector CT imaging of the chest, abdomen and pelvis was performed following the standard protocol during bolus administration of intravenous contrast. RADIATION DOSE REDUCTION: This exam was performed according to the departmental dose-optimization program which includes automated exposure control, adjustment of the mA and/or kV according to patient size and/or use of iterative reconstruction technique. CONTRAST:  75mL OMNIPAQUE  IOHEXOL  350 MG/ML SOLN COMPARISON:  July 15, 2024.  August 19, 2023. FINDINGS: CT CHEST FINDINGS Cardiovascular: No evidence of thoracic aortic dissection or aneurysm. Enlarged pulmonary artery suggesting pulmonary artery hypertension. Normal cardiac size. No pericardial effusion. Mediastinum/Nodes: No enlarged mediastinal, hilar, or axillary lymph nodes. Thyroid  gland, trachea, and esophagus demonstrate no significant findings. Lungs/Pleura: Moderate size right pleural effusion is noted with adjacent subsegmental atelectasis.  Lobar atelectasis of right lower lobe is noted. No pneumothorax is noted. Stable 1.4 by 1.1 cm left upper lobe lesion with cystic and solid areas best seen on image number 33 of series 4. Musculoskeletal: No chest wall mass or suspicious bone lesions identified. CT ABDOMEN PELVIS FINDINGS Hepatobiliary: No cholelithiasis or biliary dilatation is noted. Stable hepatic cysts. Pancreas: Unremarkable. No pancreatic ductal dilatation or surrounding inflammatory changes. Spleen: Normal in size without focal abnormality. Adrenals/Urinary Tract: Adrenal glands appear normal. Multiple bilateral renal cystic lesions are again noted, including 2.1 cm partially exophytic complex region arising from midpole of right kidney. This was described as possible renal cell carcinoma on prior MRI. No hydronephrosis or renal obstruction is noted. Urinary bladder is unremarkable. Stomach/Bowel: Stomach is unremarkable. Status post partial colectomy. There is no evidence of bowel obstruction or inflammation. Large left periumbilical hernia is noted which contains loops of small bowel, but does not result in obstruction. Vascular/Lymphatic: Aortic atherosclerosis. No enlarged abdominal or pelvic lymph nodes. Reproductive: Mild prostatic enlargement is noted. Other: No ascites is noted. Musculoskeletal: No acute or significant osseous findings. IMPRESSION: 1. Moderate size right pleural effusion is noted with adjacent subsegmental atelectasis. Lobar atelectasis of right lower lobe is noted. 2. Stable 1.4 x 1.1 cm left upper lobe lesion with cystic and solid areas. This is concerning for possible malignancy. Consider one of the following in 3 months for both low-risk and high-risk individuals: (a) repeat chest CT, (b) follow-up PET-CT, or (c) tissue sampling. This recommendation follows the consensus statement: Guidelines for Management of Incidental Pulmonary Nodules Detected on CT Images: From the Fleischner Society 2017; Radiology 2017;  284:228-243. 3. Multiple bilateral renal cystic lesions are again noted, including 2.1 cm partially exophytic complex region arising from midpole of right kidney. This was described as possible renal cell carcinoma on prior MRI. Consultation with urology is recommended. 4. Large left periumbilical hernia is noted which contains loops of small bowel, but does not result in obstruction. 5. Mild prostatic enlargement. 6. Aortic atherosclerosis. Aortic Atherosclerosis (ICD10-I70.0). Electronically Signed   By: Lynwood Landy Raddle M.D.   On: 08/08/2024 16:00   DG Chest 2 View Result Date: 08/07/2024 CLINICAL DATA:  Shortness of breath. EXAM: DG CHEST 2V COMPARISON:  Chest radiograph dated 07/23/2024. FINDINGS: Moderate right pleural effusion similar or slightly increased since the prior radiograph. There is  associated right lung base atelectasis. Pneumonia is not excluded. Follow-up to resolution recommended. No pneumothorax. Stable cardiac silhouette. No acute osseous pathology. IMPRESSION: Moderate right pleural effusion similar or slightly increased since the prior radiograph. Electronically Signed   By: Vanetta Chou M.D.   On: 08/07/2024 15:36   DG Chest 2 View Result Date: 07/23/2024 CLINICAL DATA:  Persistent cough. EXAM: CHEST - 2 VIEW COMPARISON:  Chest radiograph dated 07/18/2024. FINDINGS: Moderate right pleural effusion with right lung base atelectasis. Pneumonia or underlying mass is not excluded. The left lung is clear. No pneumothorax. Stable cardiac silhouette. No acute osseous pathology. IMPRESSION: Moderate right pleural effusion with right lung base atelectasis. Electronically Signed   By: Vanetta Chou M.D.   On: 07/23/2024 16:50   DG Chest 2 View Result Date: 07/18/2024 EXAM: 2 VIEW(S) XRAY OF THE CHEST 07/18/2024 06:05:00 PM COMPARISON: 07/07/2024 CLINICAL HISTORY: SOB FINDINGS: LUNGS AND PLEURA: Stable Moderate right pleural effusion. Right lower lung airspace disease, possibly  representing compressive atelectasis or pneumonia. No pneumothorax. HEART AND MEDIASTINUM: No acute abnormality of the cardiac and mediastinal silhouettes. BONES AND SOFT TISSUES: No acute osseous abnormality. IMPRESSION: 1. Right lower lung airspace disease, possibly representing pneumonia versus compressive atelectasis. 2. Stable moderate right pleural effusion. Electronically signed by: Oneil Devonshire MD 07/18/2024 07:00 PM EST RP Workstation: [id number]   VAS US  ABI WITH/WO TBI Result Date: 07/18/2024  LOWER EXTREMITY DOPPLER STUDY Patient Name:  ADDEN STROUT  Date of Exam:   07/18/2024 Medical Rec #: [id number]          Accession #:    [id number] Date of Birth: April 02, 1956          Patient Gender: M Patient Age:   47 years Exam Location:  [address] Procedure:      VAS US  ABI WITH/WO TBI Referring Phys: [facility] CROITORU --------------------------------------------------------------------------------  Indications: Peripheral artery disease. High Risk Factors: Hypertension, hyperlipidemia, Diabetes.  Vascular Interventions: August 2013 urgent right femoral to tibioperoneal trunk                         embolectomy for ischemic foot with subsequent                         transmetatarsal amputation (unknown date). Performing Technologist: Geni Lodge RVS, RCS  Examination Guidelines: A complete evaluation includes at minimum, Doppler waveform signals and systolic blood pressure reading at the level of bilateral brachial, anterior tibial, and posterior tibial arteries, when vessel segments are accessible. Bilateral testing is considered an integral part of a complete examination. Photoelectric Plethysmograph (PPG) waveforms and toe systolic pressure readings are included as required and additional duplex testing as needed. Limited examinations for reoccurring indications may be performed as noted.  ABI Findings: +---------+------------------+-----+---------+--------+ Right    Rt Pressure  (mmHg)IndexWaveform Comment  +---------+------------------+-----+---------+--------+ Brachial 151                                      +---------+------------------+-----+---------+--------+ PTA      169               1.10 triphasic         +---------+------------------+-----+---------+--------+ DP       163               1.06 triphasic         +---------+------------------+-----+---------+--------+ Great Toe  amp      +---------+------------------+-----+---------+--------+ +---------+------------------+-----+---------+-------+ Left     Lt Pressure (mmHg)IndexWaveform Comment +---------+------------------+-----+---------+-------+ Brachial 154                                     +---------+------------------+-----+---------+-------+ PTA      163               1.06 triphasic        +---------+------------------+-----+---------+-------+ DP       161               1.05 triphasic        +---------+------------------+-----+---------+-------+ Burnetta Oka               0.76                  +---------+------------------+-----+---------+-------+ +-------+-----------+-----------+------------+------------+ ABI/TBIToday's ABIToday's TBIPrevious ABIPrevious TBI +-------+-----------+-----------+------------+------------+ Right  1.10       amp        1.12        amp          +-------+-----------+-----------+------------+------------+ Left   1.06       0.76       1.07        0.93         +-------+-----------+-----------+------------+------------+  Bilateral ABIs appear essentially unchanged compared to prior study on 2018.  Summary: Right: Resting right ankle-brachial index is within normal range.  Left: Resting left ankle-brachial index is within normal range. Left toe pressure is >60 mmHg which suggests adequate perfusion for healing. *See table(s) above for measurements and observations.  Electronically signed by Dorn Lesches MD on 07/18/2024 at 3:19:54 PM.    Final    CT Chest W Contrast Result Date: 07/15/2024 CLINICAL DATA:  Chest pain, right pleural effusion and cough for 1 month. EXAM: CT CHEST WITH CONTRAST TECHNIQUE: Multidetector CT imaging of the chest was performed during intravenous contrast administration. RADIATION DOSE REDUCTION: This exam was performed according to the departmental dose-optimization program which includes automated exposure control, adjustment of the mA and/or kV according to patient size and/or use of iterative reconstruction technique. CONTRAST:  75mL ISOVUE -300 IOPAMIDOL  (ISOVUE -300) INJECTION 61% COMPARISON:  Chest x-ray 07/07/2024. FINDINGS: Cardiovascular: The heart is normal in size. No pericardial effusion. The aorta is within normal limits in caliber. No dissection. Moderate atherosclerotic calcifications. Minimal scattered coronary artery calcifications. The pulmonary arteries are enlarged suggesting pulmonary hypertension. Mediastinum/Nodes: No mediastinal or hilar mass or lymphadenopathy. Small scattered lymph nodes are noted along with fluid in the pericardial recesses. Borderline right hilar nodes likely reactive given the right lung findings. The esophagus is grossly normal. The thyroid  gland is unremarkable. Lungs/Pleura: There is a moderate-sized right pleural effusion with associated significant right lower lobe atelectasis. No obstructing endobronchial mass is identified. There are some scattered calcifications in the atelectatic lung and chronic aspiration would be a consideration. Small scattered pleural lesions are noted anteriorly in the left upper lobe. Elongated area posterior to the anterior second right rib. Small rounded pleural lesion near the anterior third right rib also noted. Very small pleural nodule adjacent to the anterolateral rib. 12 mm irregular nodular lesion in the left upper lobe adjacent to diabetic bronchi. Indeterminate finding. Possible scarring change  but could not exclude neoplastic process. Given its size, PET-CT may be helpful for further evaluation otherwise, close CT follow-up is suggested. No other pulmonary lesions or nodules. Upper Abdomen: No significant  upper abdominal findings. 2 low-attenuation hepatic lesions unchanged since a CT scan from 2024 and are likely benign cysts. The gallbladder is mildly contracted. There is a moderate-sized duodenal lipoma noted. No adrenal gland masses. Upper pole renal cysts are noted. Stable aortic and branch vessel calcifications. Proximal celiac artery stenosis with poststenotic dilatation noted. Musculoskeletal: No chest wall mass, supraclavicular or axillary adenopathy. The bony thorax is intact. No worrisome lytic or sclerotic bone lesions. IMPRESSION: 1. Moderate-sized right pleural effusion with associated significant right lower lobe atelectasis. No obstructing endobronchial mass is identified. 2. 12 mm irregular nodular lesion in the left upper lobe adjacent to dilated bronchi. Indeterminate finding. Possible scarring change but could not exclude neoplastic process. Given its size, PET-CT may be helpful for further evaluation otherwise, close CT follow-up is suggested. 3. Small scattered pleural lesions anteriorly in the right upper lobe. Indeterminate finding but needs surveillance. 4. No mediastinal or hilar mass or adenopathy. 5. Enlarged pulmonary arteries suggesting pulmonary hypertension. 6. Aortic atherosclerosis and emphysema. Aortic Atherosclerosis (ICD10-I70.0). Electronically Signed   By: MYRTIS Stammer M.D.   On: 07/15/2024 14:44     I personally spent a total of 55 minutes minutes in the care of the patient today including preparing to see the patient, getting/reviewing separately obtained history, performing a medically appropriate exam/evaluation, counseling and educating, placing orders, referring and communicating with other health care professionals, documenting clinical information in the  EHR, communicating results, and coordinating care.    All questions were answered. The patient knows to call the clinic with any problems, questions or concerns. No barriers to learning was detected.  Julyan Gales T Hanako Tipping IV, MD 1/7/20268:26 PM  I have read the above note and personally examined the patient. I agree with the assessment and plan as noted above.  Briefly Rick Edwards is a 69 year old male who presented to the hospital with bright red blood per rectum and was found to have a duodenal ulcer.  As part of his evaluation he also had a CT scan of the chest which showed a pulmonary lesion as well as a pleural effusion.  Thoracentesis was performed and the fluid cytology is most consistent with adenocarcinoma, though it does not appear to be a lung primary.  Additionally GI performed an endoscopy but was not able to biopsy the duodenal ulcer due to fear of worsening the bleed.  Patient reports that he is no longer having any bright red blood in his stool.  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  He notes he is not having any cough but was having some shortness of breath prior to admission.  At this time would recommend biopsy of the lung lesion in order to confirm it is a same process and additionally obtain additional tissue for further testing.  Patient will also require a PET CT scan in the outpatient setting which can be set up upon discharge.  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  Patient voiced understanding of our findings and recommendations moving forward.   Norleen IVAR Kidney, MD Department of Hematology/Oncology [facility] at [facility] Phone: [phone] Pager: [phone] Email: [email or web address]

## 2024-08-13 NOTE — Progress Notes (Signed)
 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 Presented to ED with chief complaint of black-colored stool and shortness of breath and was found to have moderate size right pleural effusion and possible GI bleed.  GI consulted.  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  FOBT ordered but is still pending.  Seen by GI, status post EGD 08/12/2023, was found to have oozing duodenal ulcer which was stopped by hemostatic spray.  GI recommended IV PPI to every 6 hours.  No further episodes of bleeding, hemoglobin is stable despite of starting on heparin , tolerating clear liquid diet, will advance to full liquid diet.  GI plans to repeat EGD to get biopsy in 1 to 2 days.  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  Underwent thoracentesis by IR 08/09/2024 with a yield of 100 cc fluid.  Fluid analysis consistent with lymphocyte predominant exudative fluid.  Patient has no history of weight loss and no travel to TB Dominant countries either so TB remains at the  bottom of the suspicion, based on this malignancy was suspected and finally cytology came back positive for adenocarcinoma unfortunately.  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  GI is recommending oncology consultation and is considering possible repeat EGD with biopsy.  I have consulted oncology/sent secure chat message to Dr. Federico.  Antithrombin 3 deficiency/supratherapeutic INR: Chronically anticoagulated on Coumadin , INR 2.7 upon arrival.  Received vitamin K  for reversal.  Currently remain on heparin .  GI prohibits from resuming Coumadin  for at least 1 week/until 08/18/2024.  Right renal mass: Multiple bilateral renal cysts, 1 including 2.1 cm right kidney possible RCC On a CT scan-EDP Dr. Elnor has discussed the findings with on-call urologist Does not recommend any inpatient further workup -to follow-up as an outpatient.  Type 2 diabetes mellitus: Last hemoglobin A1c 6.2, currently not on any medications.  Stage III CKD: At baseline.  History of chron's disease: Stable.  PVD: Continue statins.  Dyslipidemia: Continue statin.  DVT prophylaxis: SCDs Start: 08/08/24 1636 Place TED hose Start: 08/08/24 1636   Code Status: Full Code  Family Communication: None present at bedside.  Plan of care discussed with patient in length and he/she verbalized understanding and agreed with it.  Status is: Inpatient Remains inpatient appropriate because: Needs oncology evaluation and perhaps another EGD.   Estimated body mass index is 28.68 kg/m as calculated from the following:   Height as of this encounter: 5' 9 (1.753 m).   Weight as of this encounter: 88.1 kg.    Nutritional Assessment: Body mass index  is 28.68 kg/m.[name] Seen by dietician.  I agree with the assessment and plan as outlined below: Nutrition Status:        . Skin Assessment: I have examined the patient's skin and I agree with the wound assessment as  performed by the wound care RN as outlined below:    Consultants:  GI  Procedures:  None none  Antimicrobials:  Anti-infectives (From admission, onward)    None         Subjective: Patient seen and examined.  He was complaining of pain in the back/around left hip area.  On examination, he was tender indicating possible musculoskeletal tenderness/pain.  Denied any abdominal pain or nausea.  Objective: Vitals:   08/12/24 1548 08/12/24 2039 08/13/24 0447 08/13/24 0743  BP: 124/84 135/78 (!) 126/92 (!) 156/84  Pulse: 93 92 97 94  Resp: 19 18 20 18   Temp: 98.1 F (36.7 C) 97.9 F (36.6 C) 98.2 F (36.8 C) 98.5 F (36.9 C)  TempSrc:  Oral Oral Oral  SpO2: 97% 92% 92% 91%  Weight:      Height:        Intake/Output Summary (Last 24 hours) at 08/13/2024 0751 Last data filed at 08/13/2024 0452 Gross per 24 hour  Intake 1008.28 ml  Output 600 ml  Net 408.28 ml   Filed Weights   08/11/24 0432 08/11/24 0907 08/12/24 0533  Weight: 87.3 kg 87.3 kg 88.1 kg    Examination:  General exam: Appears calm and comfortable  Respiratory system: Clear to auscultation. Respiratory effort normal. Cardiovascular system: S1 & S2 heard, RRR. No JVD, murmurs, rubs, gallops or clicks. No pedal edema. Gastrointestinal system: Abdomen is nondistended, soft and nontender. No organomegaly or masses felt. Normal bowel sounds heard. Central nervous system: Alert and oriented. No focal neurological deficits. Extremities: Symmetric 5 x 5 power. Skin: No rashes, lesions or ulcers.  Psychiatry: Judgement and insight appear normal. Mood & affect appropriate.    Data Reviewed: I have personally reviewed following labs and imaging studies  CBC: Recent Labs  Lab 08/07/24 1352 08/08/24 1037 08/10/24 0627 08/10/24 1648 08/11/24 0224 08/12/24 0509 08/13/24 0520  WBC 8.5   < > 7.9 9.2 8.6 8.0 6.6  NEUTROABS 5.9  --   --   --   --   --   --   HGB 13.2   < > 10.3* 10.8* 10.2* 10.1* 10.2*  HCT  39.5   < > 30.1* 32.1* 30.1* 29.4* 29.2*  MCV 92.1   < > 90.4 92.8 92.0 92.2 89.6  PLT 317   < > 274 307 284 278 307   < > = values in this interval not displayed.   Basic Metabolic Panel: Recent Labs  Lab 08/07/24 1352 08/08/24 1037 08/09/24 0830 08/10/24 0627  NA 138 136 139 140  K 4.2 4.1 4.0 4.0  CL 101 102 104 105  CO2 27 25 25 26   GLUCOSE 104* 106* 105* 107*  BUN 25* 36* 23 16  CREATININE 0.89 0.95 0.92 0.91  CALCIUM  9.4 9.0 8.7* 8.6*  MG  --   --  1.6*  --   PHOS  --   --  3.6  --    GFR: Estimated Creatinine Clearance: 87.8 mL/min (by C-G formula based on SCr of 0.91 mg/dL). Liver Function Tests: Recent Labs  Lab 08/07/24 1352  AST 17  ALT 27  ALKPHOS 87  BILITOT 0.9  PROT 6.6  ALBUMIN  3.8   No results for input(s): LIPASE, AMYLASE  in the last 168 hours. No results for input(s): AMMONIA in the last 168 hours. Coagulation Profile: Recent Labs  Lab 08/08/24 1037 08/09/24 0830 08/10/24 0627 08/11/24 0224  INR 2.7* 3.3* 1.6* 1.4*   Cardiac Enzymes: No results for input(s): CKTOTAL, CKMB, CKMBINDEX, TROPONINI in the last 168 hours. BNP (last 3 results) Recent Labs    08/08/24 1227  PROBNP <50.0   HbA1C: No results for input(s): HGBA1C in the last 72 hours. CBG: Recent Labs  Lab 08/10/24 1647 08/11/24 0752 08/12/24 0720 08/12/24 1121 08/12/24 1548  GLUCAP 106* 100* 85 102* 110*   Lipid Profile: No results for input(s): CHOL, HDL, LDLCALC, TRIG, CHOLHDL, LDLDIRECT in the last 72 hours. Thyroid  Function Tests: No results for input(s): TSH, T4TOTAL, FREET4, T3FREE, THYROIDAB in the last 72 hours. Anemia Panel: No results for input(s): VITAMINB12, FOLATE, FERRITIN, TIBC, IRON, RETICCTPCT in the last 72 hours. Sepsis Labs: No results for input(s): PROCALCITON, LATICACIDVEN in the last 168 hours.  No results found for this or any previous visit (from the past 240 hours).   Radiology  Studies: No results found.   Scheduled Meds:  allopurinol   300 mg Oral Daily   atorvastatin   40 mg Oral Daily   doxazosin   4 mg Oral QHS   melatonin  5 mg Oral QHS   pantoprazole  (PROTONIX ) IV  40 mg Intravenous Q6H   senna-docusate  1 tablet Oral BID   sodium chloride  flush  3 mL Intravenous Q12H   sodium chloride  flush  3 mL Intravenous Q12H   spironolactone   25 mg Oral Daily   Continuous Infusions:  heparin  750 Units/hr (08/12/24 1824)      LOS: 4 days   Fredia Skeeter, MD [facility]  08/13/2024, 7:51 AM   *Please note that this is a verbal dictation therefore any spelling or grammatical errors are due to the Dragon Medical One system interpretation.  Please page via Amion and do not message via secure chat for urgent patient care matters. Secure chat can be used for non urgent patient care matters.  How to contact the [facility] Attending or Consulting provider 7A - 7P or covering provider during after hours 7P -7A, for this patient?  Check the care team in [facility] and look for a) attending/consulting [facility] provider listed and b) the [facility] team listed. Page or secure chat 7A-7P. Log into [email or web address] and use [place]'s universal password to access. If you do not have the password, please contact the hospital operator. Locate the [facility] provider you are looking for under [facility] and page to a number that you can be directly reached. If you still have difficulty reaching the provider, please page the [facility] (Director on Call) for the Hospitalists listed on amion for assistance.  "

## 2024-08-13 NOTE — Progress Notes (Signed)
 PHARMACY - ANTICOAGULATION CONSULT NOTE  Pharmacy Consult for heparin  Indication: ATIII deficiency and h/o VTE  Labs: Recent Labs    08/10/24 0627 08/10/24 1648 08/11/24 0224 08/11/24 2208 08/12/24 0509 08/12/24 1616 08/13/24 0520  HGB 10.3*   < > 10.2*  --  10.1*  --  10.2*  HCT 30.1*   < > 30.1*  --  29.4*  --  29.2*  PLT 274   < > 284  --  278  --  307  LABPROT 20.4*  --  18.2*  --   --   --   --   INR 1.6*  --  1.4*  --   --   --   --   HEPARINUNFRC  --    < > >1.10*   < > 0.88* 0.60 0.47  CREATININE 0.91  --   --   --   --   --   --    < > = values in this interval not displayed.   Assessment/Plan:  69yo male therapeutic on heparin  after rate change. Will continue infusion at current rate of 750 units/hr and confirm stable with additional level.  Marvetta Dauphin, PharmD, BCPS 08/13/2024 6:14 AM

## 2024-08-13 NOTE — Progress Notes (Signed)
 Physical Therapy Treatment Patient Details Name: Rick Edwards MRN: [id number] DOB: 08-29-1955 Today's Date: 08/13/2024   History of Present Illness 69 year old male Presented to ED 08/07/24 with chief complaint of black-colored stool. +GIB, R pleural effusion, R renal mass (plan to follow-up as OP), 1/3 thoracentesis with vagal episode with BP 76/50. 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 However, despite premedication, pt with significant low back pain with bed mobility, that progressed with activity to the point the pt needed support to stand and pivot to the recliner. The pt has been ambulating 200 ft without DME in prior sessions, will attempt to return for progression as time/schedule allows. Pt educated on gentle ROM for trunk/spinal muscles, and LE exercises he can complete from recliner.   VITALS:  - supine in bed - BP: 153/89 (108); HR: 93bpm - sitting EOB - BP: 136/76 (90); HR: 107bpm - sitting after 5 min - BP: 105/72 (83); HR: 96bpm   If plan is discharge home, recommend the following: Assistance with cooking/housework;Assist for transportation;Help with stairs or ramp for entrance;A little help with walking and/or transfers   Can travel by private vehicle        Equipment Recommendations  None recommended by PT    Recommendations for Other Services       Precautions / Restrictions Precautions Precautions: Fall Recall of Precautions/Restrictions: Intact Precaution/Restrictions Comments: orthostasis Restrictions Weight Bearing Restrictions Per Provider Order: No     Mobility  Bed Mobility Overal bed mobility: Modified Independent             General bed mobility comments: HOB elevated, no rails. increased  time due to low back pain    Transfers Overall transfer level: Needs assistance Equipment used: 1 person hand held assist Transfers: Sit to/from Stand, Bed to chair/wheelchair/BSC Sit to Stand: Contact guard assist   Step pivot transfers: Min assist       General transfer comment: minA through UE support, pt maintains significant trunk flexion due to pain in L low back and hip. unable to tolerate any additional movement    Ambulation/Gait                   Stairs             Wheelchair Mobility     Tilt Bed    Modified Rankin (Stroke Patients Only)       Balance Overall balance assessment: Mild deficits observed, not formally tested                                          Communication Communication Communication: Impaired Factors Affecting Communication: Hearing impaired  Cognition Arousal: Alert Behavior During Therapy: Flat affect   PT - Cognitive impairments: No apparent impairments                       PT - Cognition Comments: pt following commands well, internally distracted by pain Following commands: Intact      Cueing Cueing Techniques: Verbal cues  Exercises General Exercises - Lower Extremity Long Arc Quad: 10 reps, Both, AROM, Seated Hip Flexion/Marching: AAROM, Both, 10 reps, Seated Other Exercises Other Exercises: seated trunk  rotations and cat-cow to tolerance    General Comments General comments (skin integrity, edema, etc.): BP from 153/89 in supine to 105/72 (83) in sitting, reports some lightheadedness, but mostly limited by back pain. after transfer to chair, pt with onset of nausea, RN alerted      Pertinent Vitals/Pain Pain Assessment Pain Assessment: Faces Faces Pain Scale: Hurts whole lot Pain Location: L low back Pain Descriptors / Indicators: Discomfort, Spasm Pain Intervention(s): Limited activity within patient's tolerance, Monitored during session, Repositioned, Patient  requesting pain meds-RN notified, Premedicated before session    Home Living                          Prior Function            PT Goals (current goals can now be found in the care plan section) Acute Rehab PT Goals Patient Stated Goal: Be able to go home PT Goal Formulation: With patient Time For Goal Achievement: 08/23/24 Potential to Achieve Goals: Good Progress towards PT goals: Progressing toward goals    Frequency    Min 2X/week      PT Plan      Co-evaluation              AM-PAC PT 6 Clicks Mobility   Outcome Measure  Help needed turning from your back to your side while in a flat bed without using bedrails?: None Help needed moving from lying on your back to sitting on the side of a flat bed without using bedrails?: None Help needed moving to and from a bed to a chair (including a wheelchair)?: A Little Help needed standing up from a chair using your arms (e.g., wheelchair or bedside chair)?: A Little Help needed to walk in hospital room?: A Little Help needed climbing 3-5 steps with a railing? : Total 6 Click Score: 18    End of Session Equipment Utilized During Treatment: Gait belt Activity Tolerance: Treatment limited secondary to medical complications (Comment);Patient limited by pain (symptomatic orthostasis) Patient left: with call bell/phone within reach;in chair Nurse Communication: Mobility status (BP status, pain, nausea) PT Visit Diagnosis: Difficulty in walking, not elsewhere classified (R26.2);Dizziness and giddiness (R42)     Time: 9072-9044 PT Time Calculation (min) (ACUTE ONLY): 28 min  Charges:    $Therapeutic Exercise: 8-22 mins $Therapeutic Activity: 8-22 mins PT General Charges $$ ACUTE PT VISIT: 1 Visit                     Izetta Call, PT, DPT   Acute Rehabilitation Department Office [phone] Secure Chat Communication Preferred   Izetta JULIANNA Call 08/13/2024, 10:10 AM

## 2024-08-13 NOTE — TOC Initial Note (Signed)
 Transition of Care [facility]) - Initial/Assessment Note    Patient Details  Name: Rick Edwards MRN: [id number] Date of Birth: 1956/05/12  Transition of Care [facility] CM/SW Contact:    Marval Gell, RN Phone Number: 08/13/2024, 8:51 AM  Clinical Narrative:                  Per chart review  Patient admitted from home, lives alone.  Presented to ED with chief complaint of black-colored stool and shortness of breath and was found to have moderate size right pleural effusion and possible GI bleed  Was found to have oozing duodenal ulcer, per GI, needs to remain in the hospital for gradual advancement of the diet and monitoring. Anticipate DC to home when medically stable and able to tolerate PO diet.  Screened by PT with no follow up or DME needs.  PCP added to AVS for patient to call and schedule    Expected Discharge Plan: Home/Self Care Barriers to Discharge: Continued Medical Work up   Patient Goals and CMS Choice            Expected Discharge Plan and Services   Discharge Planning Services: CM Consult   Living arrangements for the past 2 months: Single Family Home                                      Prior Living Arrangements/Services Living arrangements for the past 2 months: Single Family Home Lives with:: Self                   Activities of Daily Living      Permission Sought/Granted                  Emotional Assessment              Admission diagnosis:  Pleural effusion [J90] GIB (gastrointestinal bleeding) [K92.2] Renal mass [N28.89] Pulmonary nodule [R91.1] Anticoagulated on Coumadin  [Z79.01] Gastrointestinal hemorrhage, unspecified gastrointestinal hemorrhage type [K92.2] Patient Active Problem List   Diagnosis Date Noted   GIB (gastrointestinal bleeding) 08/08/2024   Pleural effusion on right 07/10/2024   LAFB (left anterior fascicular block) 05/13/2024   Right renal mass 08/24/2023   Microalbuminuria due to type 2  diabetes mellitus (HCC) 06/13/2023   Need for prophylactic vaccination and inoculation against varicella 06/12/2023   Tinea cruris 06/12/2023   BPH without obstruction/lower urinary tract symptoms 03/02/2021   Intrinsic eczema 93/79/7977   LSC (lichen simplex chronicus) 01/18/2021   Chronic gout due to renal impairment of multiple sites without tophus 06/01/2020   Elevated PSA 11/24/2019   Type II diabetes mellitus with manifestations (HCC) 08/17/2018   Atherosclerosis of aorta 09/26/2017   Drug-induced erectile dysfunction 07/19/2016   Pure hypertriglyceridemia 04/05/2016   Hyperlipidemia with target LDL less than 70 01/18/2016   OA (osteoarthritis) of knee 11/25/2013   PVD (peripheral vascular disease) 09/24/2013   Stage 3a chronic kidney disease (HCC) 05/26/2013   Antithrombin III  deficiency 05/16/2013   Long term (current) use of anticoagulants 11/27/2012   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) 04/10/2012   Splenic infarction 03/20/2012   CROHN'S DISEASE-LARGE INTESTINE 03/19/2009   Essential hypertension, malignant 03/02/2009   PCP:  Joshua Debby CROME, MD Pharmacy:   [facility] [phone] [address], [place] - [address] [place] [address] Phone: [phone] Fax: [phone]  [facility] [phone]  [place], [place] - [address] AT [facility] [address] [place] [address] Phone: [phone] Fax: [phone]     Social Drivers of Health (SDOH) Social History: SDOH Screenings   Food Insecurity: No Food Insecurity (08/08/2024)  Housing: Low Risk (08/08/2024)  Transportation Needs: No Transportation Needs (08/08/2024)  Utilities: Not At Risk (08/08/2024)  Alcohol Screen: Low Risk (06/11/2023)  Depression (PHQ2-9): Low Risk (06/12/2024)  Financial Resource Strain: Low Risk (06/11/2023)  Physical Activity: Sufficiently Active (06/12/2024)  Social Connections: Moderately Integrated  (08/08/2024)  Stress: No Stress Concern Present (06/12/2024)  Tobacco Use: Medium Risk (08/11/2024)  Health Literacy: Adequate Health Literacy (06/12/2024)   SDOH Interventions:     Readmission Risk Interventions     No data to display

## 2024-08-13 NOTE — Progress Notes (Signed)
 PHARMACY - ANTICOAGULATION CONSULT NOTE  Pharmacy Consult for heparin  Indication: antithrombin III  deficiency and Hx DVT; holding home warfarin  Allergies[1]  Patient Measurements: Height: 5' 9 (175.3 cm) Weight: 88.1 kg (194 lb 3.6 oz) IBW/kg (Calculated) : 70.7 HEPARIN  DW (KG): 87.3  Vital Signs: Temp: 98.5 F (36.9 C) (01/07 0743) Temp Source: Oral (01/07 0743) BP: 156/84 (01/07 0743) Pulse Rate: 94 (01/07 0743)  Labs: Recent Labs    08/11/24 0224 08/11/24 2208 08/12/24 0509 08/12/24 1616 08/13/24 0520 08/13/24 1106  HGB 10.2*  --  10.1*  --  10.2*  --   HCT 30.1*  --  29.4*  --  29.2*  --   PLT 284  --  278  --  307  --   LABPROT 18.2*  --   --   --   --   --   INR 1.4*  --   --   --   --   --   HEPARINUNFRC >1.10*   < > 0.88* 0.60 0.47 0.23*   < > = values in this interval not displayed.    Estimated Creatinine Clearance: 87.8 mL/min (by C-G formula based on SCr of 0.91 mg/dL).   Medical History: Past Medical History:  Diagnosis Date   Anemia    anemia thrombocytopenia, saw Hematology 2011, can not r/o myeloproliferative d/c   Antithrombin III  deficiency 05/16/2013   On coumadin  for this   Blood clot in vein    in right leg   Chronic kidney disease 05/27/2013   ACUTE RENAL FAILURE    Crohn's disease (HCC)    tx. Imuran    GI bleed 6/11   w/ normal EGD 6/11, 3 PRBCs    Hearing loss    wears hearing aid left ear; birthed with hearing loss   HOH (hard of hearing) 05/16/2013   Hypercalcemia    Hyperkalemia 05/27/2013   Hypertension    Ileostomy in place [facility]) 04-11-13   04-18-13 ileostomy to be taken down.   Perianal abscess 2001   s/p right hemicolectomy, and drainage of retroperitoneal abcess 2003   Peripheral vascular disease    Transfusion history    last 8'13    Medications:  Scheduled:   allopurinol   300 mg Oral Daily   atorvastatin   40 mg Oral Daily   doxazosin   4 mg Oral QHS   melatonin  5 mg Oral QHS   pantoprazole  (PROTONIX ) IV  40  mg Intravenous Q6H   senna-docusate  1 tablet Oral BID   sodium chloride  flush  3 mL Intravenous Q12H   sodium chloride  flush  3 mL Intravenous Q12H   spironolactone   25 mg Oral Daily    Assessment: Patient is a 69 YO male admitted with melena/suspected GI bleeding. Patient on warfarin PTA. Pharmacy consulted to dose heparin  while warfarin on hold.  Patient is s/p EGD 1/5 with bleeding duodenal ulcer noted.  GI recs holding warfarin x 1 week.  OK to restart heparin  4 hours after procedure and monitor for rebleeding.  Heparin  level subtherapeutic (0.23) on heparin  at 750 units/hr.  Targeting lower end of goal range given risk for rebleed.  No bleeding noted.  Hgb stable 10.2.  Goal of Therapy:  Heparin  level 0.3-0.5 units/ml Monitor platelets by anticoagulation protocol: Yes   Plan:  Increase heparin  to 800 units/hr Heparin  level and CBC daily while on heparin   [employer], Pharm.D., BCPS Clinical Pharmacist Clinical phone for 08/13/2024 from 7:30-3:00 is [phone].  **Pharmacist phone directory can be found  on [email or web address] listed under [facility].  08/13/2024 12:40 PM           [1]  Allergies Allergen Reactions   Olmesartan      Hx of severe AKI requiring HD on ACE per renal note, hx of AKI and hypotension with olmesartan  07/2023   Mesalamine Rash    REACTION: Rash

## 2024-08-13 NOTE — Consult Note (Signed)
 "  NAME:  Rick Edwards, MRN:  [id number], DOB:  March 13, 1956, LOS: 4 ADMISSION DATE:  08/07/2024, CONSULTATION DATE:  08/13/24 REFERRING MD:  Federico, CHIEF COMPLAINT:  dark stool   History of Present Illness:  69 year old man with fairly complex PMH including AT3 deficiency on AC, Crohn's, previous ileostomy p/w black stools and SOB.  Workup revealed R effusion, duodenal ulcer (sprayed 1/5).  Effusion returned as adenocarcinoma with staining c/w upper GI source.  GI concerned that duodenal ulcer biopsy would result in further bleeding so pulmonary asked to consider biopsy of a LUL lung nodule.  Patient states breathing improved after thora.  He denies any smoking history or hemoptysis.  Does have some nonspecific weight loss.  Pertinent  Medical History   Past Medical History:  Diagnosis Date   Anemia    anemia thrombocytopenia, saw Hematology 2011, can not r/o myeloproliferative d/c   Antithrombin III  deficiency 05/16/2013   On coumadin  for this   Blood clot in vein    in right leg   Chronic kidney disease 05/27/2013   ACUTE RENAL FAILURE    Crohn's disease (HCC)    tx. Imuran    GI bleed 6/11   w/ normal EGD 6/11, 3 PRBCs    Hearing loss    wears hearing aid left ear; birthed with hearing loss   HOH (hard of hearing) 05/16/2013   Hypercalcemia    Hyperkalemia 05/27/2013   Hypertension    Ileostomy in place [facility]) 04-11-13   04-18-13 ileostomy to be taken down.   Perianal abscess 2001   s/p right hemicolectomy, and drainage of retroperitoneal abcess 2003   Peripheral vascular disease    Transfusion history    last 8'13     Significant Hospital Events: Including procedures, antibiotic start and stop dates in addition to other pertinent events     Interim History / Subjective:  consult  Objective    Blood pressure 116/79, pulse (!) 102, temperature 99.9 F (37.7 C), temperature source Oral, resp. rate 16, height 5' 9 (1.753 m), weight 88.1 kg, SpO2 94%.         Intake/Output Summary (Last 24 hours) at 08/13/2024 1719 Last data filed at 08/13/2024 0910 Gross per 24 hour  Intake 268.28 ml  Output 700 ml  Net -431.72 ml   Filed Weights   08/11/24 0432 08/11/24 0907 08/12/24 0533  Weight: 87.3 kg 87.3 kg 88.1 kg    Examination: General: no distress, chronically ill appearing HENT: MM Lungs: clear, no wheezing, no accessory muscle use Cardiovascular: regular, mild tachy, ext warm Abdomen: soft, nontender Extremities: no edema Neuro: moves everything Skin: no rashes  Labs/imaging reviewed  Resolved problem list   Assessment and Plan  Presumed metastatic 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  Concurrent abnormal kidney masses and small  suspicious LUL lung nodule.  Suspect dealing with 2 distinct (possibly 3 but kidneys could be more indolent) malignancies given distribution.  AT3 def on AC  Hold heparin  gtt at MN NPO MN Tentative for ENB tomorrow for LUL nodule with Dr. Zaida understanding yield may not be what we need  Labs   CBC: Recent Labs  Lab 08/07/24 1352 08/08/24 1037 08/10/24 0627 08/10/24 1648 08/11/24 0224 08/12/24 0509 08/13/24 0520  WBC 8.5   < > 7.9 9.2 8.6 8.0 6.6  NEUTROABS 5.9  --   --   --   --   --   --  HGB 13.2   < > 10.3* 10.8* 10.2* 10.1* 10.2*  HCT 39.5   < > 30.1* 32.1* 30.1* 29.4* 29.2*  MCV 92.1   < > 90.4 92.8 92.0 92.2 89.6  PLT 317   < > 274 307 284 278 307   < > = values in this interval not displayed.    Basic Metabolic Panel: Recent Labs  Lab 08/07/24 1352 08/08/24 1037 08/09/24 0830 08/10/24 0627  NA 138 136 139 140  K 4.2 4.1 4.0 4.0  CL 101 102 104 105  CO2 27 25 25 26   GLUCOSE 104* 106* 105* 107*  BUN 25* 36* 23 16  CREATININE 0.89 0.95 0.92 0.91  CALCIUM  9.4 9.0 8.7* 8.6*  MG  --   --  1.6*  --   PHOS  --   --  3.6  --    GFR: Estimated Creatinine Clearance: 87.8 mL/min (by C-G formula based on SCr of 0.91  mg/dL). Recent Labs  Lab 08/10/24 1648 08/11/24 0224 08/12/24 0509 08/13/24 0520  WBC 9.2 8.6 8.0 6.6    Liver Function Tests: Recent Labs  Lab 08/07/24 1352  AST 17  ALT 27  ALKPHOS 87  BILITOT 0.9  PROT 6.6  ALBUMIN  3.8   No results for input(s): LIPASE, AMYLASE in the last 168 hours. No results for input(s): AMMONIA in the last 168 hours.  ABG    Component Value Date/Time   PHART 7.506 (H) 05/17/2013 0430   PCO2ART 36.9 05/17/2013 0430   PO2ART 78.4 (L) 05/17/2013 0430   HCO3 29.0 (H) 05/17/2013 0430   TCO2 30.1 05/17/2013 0430   O2SAT 95.9 05/17/2013 0430     Coagulation Profile: Recent Labs  Lab 08/08/24 1037 08/09/24 0830 08/10/24 0627 08/11/24 0224  INR 2.7* 3.3* 1.6* 1.4*    Cardiac Enzymes: No results for input(s): CKTOTAL, CKMB, CKMBINDEX, TROPONINI in the last 168 hours.  HbA1C: Hgb A1c MFr Bld  Date/Time Value Ref Range Status  05/13/2024 10:09 AM 6.2 4.6 - 6.5 % Final    Comment:    Glycemic Control Guidelines for People with Diabetes:Non Diabetic:  <6%Goal of Therapy: <7%Additional Action Suggested:  >8%   06/12/2023 10:17 AM 5.9 4.6 - 6.5 % Final    Comment:    Glycemic Control Guidelines for People with Diabetes:Non Diabetic:  <6%Goal of Therapy: <7%Additional Action Suggested:  >8%     CBG: Recent Labs  Lab 08/10/24 1647 08/11/24 0752 08/12/24 0720 08/12/24 1121 08/12/24 1548  GLUCAP 106* 100* 85 102* 110*    Review of Systems:    Positive Symptoms in bold:  Constitutional fevers, chills, weight loss, fatigue, anorexia, malaise  Eyes decreased vision, double vision, eye irritation  Ears, Nose, Mouth, Throat sore throat, trouble swallowing, sinus congestion  Cardiovascular chest pain, paroxysmal nocturnal dyspnea, lower ext edema, palpitations   Respiratory SOB, cough, DOE, hemoptysis, wheezing  Gastrointestinal nausea, vomiting, diarrhea  Genitourinary burning with urination, trouble urinating   Musculoskeletal joint aches, joint swelling, back pain  Integumentary  rashes, skin lesions  Neurological focal weakness, focal numbness, trouble speaking, headaches  Psychiatric depression, anxiety, confusion  Endocrine polyuria, polydipsia, cold intolerance, heat intolerance  Hematologic abnormal bruising, abnormal bleeding, unexplained nose bleeds  Allergic/Immunologic recurrent infections, hives, swollen lymph nodes     Past Medical History:  He,  has a past medical history of Anemia, Antithrombin III  deficiency (05/16/2013), Blood clot in vein, Chronic kidney disease (05/27/2013), Crohn's disease (HCC), GI bleed (6/11), Hearing loss, HOH (hard of hearing) (  05/16/2013), Hypercalcemia, Hyperkalemia (05/27/2013), Hypertension, Ileostomy in place [facility]) (04-11-13), Perianal abscess (2001), Peripheral vascular disease, and Transfusion history.   Surgical History:   Past Surgical History:  Procedure Laterality Date   Anal fistulotomy  2002   EMBOLECTOMY  03/12/2012   Procedure: EMBOLECTOMY;  Surgeon: Lonni GORMAN Blade, MD;  Location: [facility] OR;  Service: Vascular;  Laterality: Right;  Right popliteal embolectomy with vein patch angioplasty, right posterior tibial embolectomy with vein patch angioplasty    ESOPHAGOGASTRODUODENOSCOPY N/A 12/12/2023   Procedure: EGD (ESOPHAGOGASTRODUODENOSCOPY);  Surgeon: Burnette Fallow, MD;  Location: [facility];  Service: Gastroenterology;  Laterality: N/A;   ESOPHAGOGASTRODUODENOSCOPY N/A 08/11/2024   Procedure: EGD (ESOPHAGOGASTRODUODENOSCOPY);  Surgeon: Dianna Specking, MD;  Location: [facility] ENDOSCOPY;  Service: Gastroenterology;  Laterality: N/A;   HEMICOLECTOMY  08/29/2001   perforated abscess   HEMOSTASIS CLIP PLACEMENT  08/11/2024   Procedure: CONTROL OF HEMORRHAGE, GI TRACT, ENDOSCOPIC, BY HEMOSTATIC POWDER APPLICATION;  Surgeon: Dianna Specking, MD;  Location: [facility] ENDOSCOPY;  Service: Gastroenterology;;   ILEOSTOMY CLOSURE N/A 04/18/2013   Procedure:  ILEOSTOMY TAKEDOWN;  Surgeon: Morene ONEIDA Olives, MD;  Location: [facility] ORS;  Service: General;  Laterality: N/A;   INTRAOPERATIVE ARTERIOGRAM  03/12/2012   Procedure: INTRA OPERATIVE ARTERIOGRAM;  Surgeon: Lonni GORMAN Blade, MD;  Location: [facility] OR;  Service: Vascular;  Laterality: Right;   PARTIAL COLECTOMY  03/11/2012   Procedure: PARTIAL COLECTOMY;  Surgeon: Morene ONEIDA Olives, MD;  Location: [facility];  Service: General;  Laterality: N/A;  subtotal colectomy transverse and left colon    TRANSMETATARSAL AMPUTATION  05/10/2012   right   UPPER ESOPHAGEAL ENDOSCOPIC ULTRASOUND (EUS) N/A 12/12/2023   Procedure: UPPER ESOPHAGEAL ENDOSCOPIC ULTRASOUND (EUS);  Surgeon: Burnette Fallow, MD;  Location: [facility];  Service: Gastroenterology;  Laterality: N/A;     Social History:   reports that he quit smoking about 13 years ago. His smoking use included cigarettes. He started smoking about 22 years ago. He has a 2.3 pack-year smoking history. He has never used smokeless tobacco. He reports that he does not currently use drugs after having used the following drugs: Marijuana. He reports that he does not drink alcohol.   Family History:  His family history includes Heart disease in his father; Hyperlipidemia in his father and mother; Hypertension in his father, mother, and another family member; Stroke in an other family member. There is no history of Coronary artery disease, Diabetes, Colon cancer, Prostate cancer, or Kidney failure.   Allergies Allergies[1]   Home Medications  Prior to Admission medications  Medication Sig Start Date End Date Taking? Authorizing Provider  allopurinol  (ZYLOPRIM ) 300 MG tablet TAKE 1 TABLET BY MOUTH EVERY DAY 07/21/24  Yes Joshua Debby CROME, MD  amLODipine  (NORVASC ) 10 MG tablet TAKE 1 TABLET BY MOUTH EVERYDAY AT BEDTIME 07/21/24  Yes Joshua Debby CROME, MD  atorvastatin  (LIPITOR) 40 MG tablet TAKE 1 TABLET BY MOUTH EVERY DAY 07/11/24  Yes Joshua Debby CROME, MD  doxazosin   (CARDURA ) 4 MG tablet Take 1 tablet (4 mg total) by mouth at bedtime. 05/26/24  Yes Joshua Debby CROME, MD  omega-3 acid ethyl esters (LOVAZA ) 1 g capsule Take 2 g by mouth 2 (two) times daily.   Yes [provider]  spironolactone  (ALDACTONE ) 25 MG tablet Take 1 tablet (25 mg total) by mouth daily. 10/26/23  Yes Joshua Debby CROME, MD  warfarin (COUMADIN ) 5 MG tablet TAKE 1 1/2 TABLETS BY MOUTH DAILY EXCEPT TAKE 1 TABLET ON TUESDAY, THURSDAY AND SATURDAY OR AS DIRECTED BY  CLINIC. 07/21/24  Yes Joshua Debby CROME, MD  albuterol  (VENTOLIN  HFA) 108 (90 Base) MCG/ACT inhaler Inhale 1-2 puffs into the lungs every 6 (six) hours as needed for wheezing or shortness of breath. Patient not taking: Reported on 08/08/2024 07/23/24   Reddick, Johnathan B, NP  amoxicillin -clavulanate (AUGMENTIN ) 875-125 MG tablet Take 1 tablet by mouth every 12 (twelve) hours. Patient not taking: Reported on 08/07/2024 07/19/24   Minnie Tinnie BRAVO, PA  carvedilol  (COREG ) 25 MG tablet TAKE 1 TABLET (25 MG TOTAL) BY MOUTH TWICE A DAY WITH MEALS 08/11/24   Joshua Debby CROME, MD  hyoscyamine (LEVSIN SL) 0.125 MG SL tablet Place under the tongue 2 (two) times daily as needed. Patient not taking: No sig reported 02/25/20   [provider]  triamcinolone  cream (KENALOG ) 0.5 % Apply 1 Application topically 3 (three) times daily. Patient not taking: Reported on 08/07/2024 06/12/23   Joshua Debby CROME, MD  VASCEPA  1 g capsule TAKE 2 CAPSULES BY MOUTH 2 TIMES DAILY. Patient not taking: Reported on 08/08/2024 11/05/23   Joshua Debby CROME, MD     Critical care time: N/A              [1]  Allergies Allergen Reactions   Olmesartan      Hx of severe AKI requiring HD on ACE per renal note, hx of AKI and hypotension with olmesartan  07/2023   Mesalamine Rash    REACTION: Rash   "

## 2024-08-13 NOTE — Progress Notes (Signed)
 [facility] Gastroenterology Progress Note  Rick Edwards 69 y.o. 1956-08-07   Subjective: No rectal bleeding. Sitting in bedside chair.  Objective: Vital signs: Vitals:   08/13/24 0743 08/13/24 1408  BP: (!) 156/84 116/79  Pulse: 94 (!) 102  Resp: 18 16  Temp: 98.5 F (36.9 C) 99.9 F (37.7 C)  SpO2: 91% 94%    Physical Exam: Gen: elderly, well-nourished, pleasant, no acute distress  HEENT: anicteric sclera CV: RRR Chest: CTA B Abd: soft, nontender, nondistended, +BS, surgical scars Ext: no edema  Lab Results: No results for input(s): NA, K, CL, CO2, GLUCOSE, BUN, CREATININE, CALCIUM , MG, PHOS in the last 72 hours. No results for input(s): AST, ALT, ALKPHOS, BILITOT, PROT, ALBUMIN  in the last 72 hours. Recent Labs    08/12/24 0509 08/13/24 0520  WBC 8.0 6.6  HGB 10.1* 10.2*  HCT 29.4* 29.2*  MCV 92.2 89.6  PLT 278 307      Assessment/Plan: Bleeding duodenal ulcer - no signs of further bleeding. Pleural fluid consistent with adenocarcinoma of unknown primary. Duodenal ulcer high risk for rebleeding if biopsied. Would recommend biopsy of lung lesion if possible opposed to biopsies of duodenal ulcer. Changed PPI to IV Q 12 hours.    Rick Edwards 08/13/2024, 2:14 PM  Questions please call [phone] ID: Rick Edwards, male   DOB: March 15, 1956, 69 y.o.   MRN: [id number]

## 2024-08-13 NOTE — Plan of Care (Signed)
   Problem: Activity: Goal: Risk for activity intolerance will decrease Outcome: Progressing   Problem: Nutrition: Goal: Adequate nutrition will be maintained Outcome: Progressing   Problem: Pain Managment: Goal: General experience of comfort will improve and/or be controlled Outcome: Progressing   Problem: Safety: Goal: Ability to remain free from injury will improve Outcome: Progressing

## 2024-08-13 NOTE — H&P (View-Only) (Signed)
 "  NAME:  Rick Edwards, MRN:  [id number], DOB:  01/31/1956, LOS: 4 ADMISSION DATE:  08/07/2024, CONSULTATION DATE:  08/13/24 REFERRING MD:  Federico, CHIEF COMPLAINT:  dark stool   History of Present Illness:  69 year old man with fairly complex PMH including AT3 deficiency on AC, Crohn's, previous ileostomy p/w black stools and SOB.  Workup revealed R effusion, duodenal ulcer (sprayed 1/5).  Effusion returned as adenocarcinoma with staining c/w upper GI source.  GI concerned that duodenal ulcer biopsy would result in further bleeding so pulmonary asked to consider biopsy of a LUL lung nodule.  Patient states breathing improved after thora.  He denies any smoking history or hemoptysis.  Does have some nonspecific weight loss.  Pertinent  Medical History   Past Medical History:  Diagnosis Date   Anemia    anemia thrombocytopenia, saw Hematology 2011, can not r/o myeloproliferative d/c   Antithrombin III  deficiency 05/16/2013   On coumadin  for this   Blood clot in vein    in right leg   Chronic kidney disease 05/27/2013   ACUTE RENAL FAILURE    Crohn's disease (HCC)    tx. Imuran    GI bleed 6/11   w/ normal EGD 6/11, 3 PRBCs    Hearing loss    wears hearing aid left ear; birthed with hearing loss   HOH (hard of hearing) 05/16/2013   Hypercalcemia    Hyperkalemia 05/27/2013   Hypertension    Ileostomy in place [facility]) 04-11-13   04-18-13 ileostomy to be taken down.   Perianal abscess 2001   s/p right hemicolectomy, and drainage of retroperitoneal abcess 2003   Peripheral vascular disease    Transfusion history    last 8'13     Significant Hospital Events: Including procedures, antibiotic start and stop dates in addition to other pertinent events     Interim History / Subjective:  consult  Objective    Blood pressure 116/79, pulse (!) 102, temperature 99.9 F (37.7 C), temperature source Oral, resp. rate 16, height 5' 9 (1.753 m), weight 88.1 kg, SpO2 94%.         Intake/Output Summary (Last 24 hours) at 08/13/2024 1719 Last data filed at 08/13/2024 0910 Gross per 24 hour  Intake 268.28 ml  Output 700 ml  Net -431.72 ml   Filed Weights   08/11/24 0432 08/11/24 0907 08/12/24 0533  Weight: 87.3 kg 87.3 kg 88.1 kg    Examination: General: no distress, chronically ill appearing HENT: MM Lungs: clear, no wheezing, no accessory muscle use Cardiovascular: regular, mild tachy, ext warm Abdomen: soft, nontender Extremities: no edema Neuro: moves everything Skin: no rashes  Labs/imaging reviewed  Resolved problem list   Assessment and Plan  Presumed metastatic 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  Concurrent abnormal kidney masses and small  suspicious LUL lung nodule.  Suspect dealing with 2 distinct (possibly 3 but kidneys could be more indolent) malignancies given distribution.  AT3 def on AC  Hold heparin  gtt at MN NPO MN Tentative for ENB tomorrow for LUL nodule with Dr. Zaida understanding yield may not be what we need  Labs   CBC: Recent Labs  Lab 08/07/24 1352 08/08/24 1037 08/10/24 0627 08/10/24 1648 08/11/24 0224 08/12/24 0509 08/13/24 0520  WBC 8.5   < > 7.9 9.2 8.6 8.0 6.6  NEUTROABS 5.9  --   --   --   --   --   --  HGB 13.2   < > 10.3* 10.8* 10.2* 10.1* 10.2*  HCT 39.5   < > 30.1* 32.1* 30.1* 29.4* 29.2*  MCV 92.1   < > 90.4 92.8 92.0 92.2 89.6  PLT 317   < > 274 307 284 278 307   < > = values in this interval not displayed.    Basic Metabolic Panel: Recent Labs  Lab 08/07/24 1352 08/08/24 1037 08/09/24 0830 08/10/24 0627  NA 138 136 139 140  K 4.2 4.1 4.0 4.0  CL 101 102 104 105  CO2 27 25 25 26   GLUCOSE 104* 106* 105* 107*  BUN 25* 36* 23 16  CREATININE 0.89 0.95 0.92 0.91  CALCIUM  9.4 9.0 8.7* 8.6*  MG  --   --  1.6*  --   PHOS  --   --  3.6  --    GFR: Estimated Creatinine Clearance: 87.8 mL/min (by C-G formula based on SCr of 0.91  mg/dL). Recent Labs  Lab 08/10/24 1648 08/11/24 0224 08/12/24 0509 08/13/24 0520  WBC 9.2 8.6 8.0 6.6    Liver Function Tests: Recent Labs  Lab 08/07/24 1352  AST 17  ALT 27  ALKPHOS 87  BILITOT 0.9  PROT 6.6  ALBUMIN  3.8   No results for input(s): LIPASE, AMYLASE in the last 168 hours. No results for input(s): AMMONIA in the last 168 hours.  ABG    Component Value Date/Time   PHART 7.506 (H) 05/17/2013 0430   PCO2ART 36.9 05/17/2013 0430   PO2ART 78.4 (L) 05/17/2013 0430   HCO3 29.0 (H) 05/17/2013 0430   TCO2 30.1 05/17/2013 0430   O2SAT 95.9 05/17/2013 0430     Coagulation Profile: Recent Labs  Lab 08/08/24 1037 08/09/24 0830 08/10/24 0627 08/11/24 0224  INR 2.7* 3.3* 1.6* 1.4*    Cardiac Enzymes: No results for input(s): CKTOTAL, CKMB, CKMBINDEX, TROPONINI in the last 168 hours.  HbA1C: Hgb A1c MFr Bld  Date/Time Value Ref Range Status  05/13/2024 10:09 AM 6.2 4.6 - 6.5 % Final    Comment:    Glycemic Control Guidelines for People with Diabetes:Non Diabetic:  <6%Goal of Therapy: <7%Additional Action Suggested:  >8%   06/12/2023 10:17 AM 5.9 4.6 - 6.5 % Final    Comment:    Glycemic Control Guidelines for People with Diabetes:Non Diabetic:  <6%Goal of Therapy: <7%Additional Action Suggested:  >8%     CBG: Recent Labs  Lab 08/10/24 1647 08/11/24 0752 08/12/24 0720 08/12/24 1121 08/12/24 1548  GLUCAP 106* 100* 85 102* 110*    Review of Systems:    Positive Symptoms in bold:  Constitutional fevers, chills, weight loss, fatigue, anorexia, malaise  Eyes decreased vision, double vision, eye irritation  Ears, Nose, Mouth, Throat sore throat, trouble swallowing, sinus congestion  Cardiovascular chest pain, paroxysmal nocturnal dyspnea, lower ext edema, palpitations   Respiratory SOB, cough, DOE, hemoptysis, wheezing  Gastrointestinal nausea, vomiting, diarrhea  Genitourinary burning with urination, trouble urinating   Musculoskeletal joint aches, joint swelling, back pain  Integumentary  rashes, skin lesions  Neurological focal weakness, focal numbness, trouble speaking, headaches  Psychiatric depression, anxiety, confusion  Endocrine polyuria, polydipsia, cold intolerance, heat intolerance  Hematologic abnormal bruising, abnormal bleeding, unexplained nose bleeds  Allergic/Immunologic recurrent infections, hives, swollen lymph nodes     Past Medical History:  He,  has a past medical history of Anemia, Antithrombin III  deficiency (05/16/2013), Blood clot in vein, Chronic kidney disease (05/27/2013), Crohn's disease (HCC), GI bleed (6/11), Hearing loss, HOH (hard of hearing) (  05/16/2013), Hypercalcemia, Hyperkalemia (05/27/2013), Hypertension, Ileostomy in place [facility]) (04-11-13), Perianal abscess (2001), Peripheral vascular disease, and Transfusion history.   Surgical History:   Past Surgical History:  Procedure Laterality Date   Anal fistulotomy  2002   EMBOLECTOMY  03/12/2012   Procedure: EMBOLECTOMY;  Surgeon: Lonni GORMAN Blade, MD;  Location: [facility] OR;  Service: Vascular;  Laterality: Right;  Right popliteal embolectomy with vein patch angioplasty, right posterior tibial embolectomy with vein patch angioplasty    ESOPHAGOGASTRODUODENOSCOPY N/A 12/12/2023   Procedure: EGD (ESOPHAGOGASTRODUODENOSCOPY);  Surgeon: Burnette Fallow, MD;  Location: [facility];  Service: Gastroenterology;  Laterality: N/A;   ESOPHAGOGASTRODUODENOSCOPY N/A 08/11/2024   Procedure: EGD (ESOPHAGOGASTRODUODENOSCOPY);  Surgeon: Dianna Specking, MD;  Location: [facility] ENDOSCOPY;  Service: Gastroenterology;  Laterality: N/A;   HEMICOLECTOMY  08/29/2001   perforated abscess   HEMOSTASIS CLIP PLACEMENT  08/11/2024   Procedure: CONTROL OF HEMORRHAGE, GI TRACT, ENDOSCOPIC, BY HEMOSTATIC POWDER APPLICATION;  Surgeon: Dianna Specking, MD;  Location: [facility] ENDOSCOPY;  Service: Gastroenterology;;   ILEOSTOMY CLOSURE N/A 04/18/2013   Procedure:  ILEOSTOMY TAKEDOWN;  Surgeon: Morene ONEIDA Olives, MD;  Location: [facility] ORS;  Service: General;  Laterality: N/A;   INTRAOPERATIVE ARTERIOGRAM  03/12/2012   Procedure: INTRA OPERATIVE ARTERIOGRAM;  Surgeon: Lonni GORMAN Blade, MD;  Location: [facility] OR;  Service: Vascular;  Laterality: Right;   PARTIAL COLECTOMY  03/11/2012   Procedure: PARTIAL COLECTOMY;  Surgeon: Morene ONEIDA Olives, MD;  Location: [facility] ORS;  Service: General;  Laterality: N/A;  subtotal colectomy transverse and left colon    TRANSMETATARSAL AMPUTATION  05/10/2012   right   UPPER ESOPHAGEAL ENDOSCOPIC ULTRASOUND (EUS) N/A 12/12/2023   Procedure: UPPER ESOPHAGEAL ENDOSCOPIC ULTRASOUND (EUS);  Surgeon: Burnette Fallow, MD;  Location: [facility];  Service: Gastroenterology;  Laterality: N/A;     Social History:   reports that he quit smoking about 13 years ago. His smoking use included cigarettes. He started smoking about 22 years ago. He has a 2.3 pack-year smoking history. He has never used smokeless tobacco. He reports that he does not currently use drugs after having used the following drugs: Marijuana. He reports that he does not drink alcohol.   Family History:  His family history includes Heart disease in his father; Hyperlipidemia in his father and mother; Hypertension in his father, mother, and another family member; Stroke in an other family member. There is no history of Coronary artery disease, Diabetes, Colon cancer, Prostate cancer, or Kidney failure.   Allergies Allergies[1]   Home Medications  Prior to Admission medications  Medication Sig Start Date End Date Taking? Authorizing Provider  allopurinol  (ZYLOPRIM ) 300 MG tablet TAKE 1 TABLET BY MOUTH EVERY DAY 07/21/24  Yes Joshua Debby CROME, MD  amLODipine  (NORVASC ) 10 MG tablet TAKE 1 TABLET BY MOUTH EVERYDAY AT BEDTIME 07/21/24  Yes Joshua Debby CROME, MD  atorvastatin  (LIPITOR) 40 MG tablet TAKE 1 TABLET BY MOUTH EVERY DAY 07/11/24  Yes Joshua Debby CROME, MD  doxazosin   (CARDURA ) 4 MG tablet Take 1 tablet (4 mg total) by mouth at bedtime. 05/26/24  Yes Joshua Debby CROME, MD  omega-3 acid ethyl esters (LOVAZA ) 1 g capsule Take 2 g by mouth 2 (two) times daily.   Yes [provider]  spironolactone  (ALDACTONE ) 25 MG tablet Take 1 tablet (25 mg total) by mouth daily. 10/26/23  Yes Joshua Debby CROME, MD  warfarin (COUMADIN ) 5 MG tablet TAKE 1 1/2 TABLETS BY MOUTH DAILY EXCEPT TAKE 1 TABLET ON TUESDAY, THURSDAY AND SATURDAY OR AS DIRECTED BY  CLINIC. 07/21/24  Yes Joshua Debby CROME, MD  albuterol  (VENTOLIN  HFA) 108 (90 Base) MCG/ACT inhaler Inhale 1-2 puffs into the lungs every 6 (six) hours as needed for wheezing or shortness of breath. Patient not taking: Reported on 08/08/2024 07/23/24   Reddick, Rick B, NP  amoxicillin -clavulanate (AUGMENTIN ) 875-125 MG tablet Take 1 tablet by mouth every 12 (twelve) hours. Patient not taking: Reported on 08/07/2024 07/19/24   Minnie Tinnie BRAVO, PA  carvedilol  (COREG ) 25 MG tablet TAKE 1 TABLET (25 MG TOTAL) BY MOUTH TWICE A DAY WITH MEALS 08/11/24   Joshua Debby CROME, MD  hyoscyamine (LEVSIN SL) 0.125 MG SL tablet Place under the tongue 2 (two) times daily as needed. Patient not taking: No sig reported 02/25/20   [provider]  triamcinolone  cream (KENALOG ) 0.5 % Apply 1 Application topically 3 (three) times daily. Patient not taking: Reported on 08/07/2024 06/12/23   Joshua Debby CROME, MD  VASCEPA  1 g capsule TAKE 2 CAPSULES BY MOUTH 2 TIMES DAILY. Patient not taking: Reported on 08/08/2024 11/05/23   Joshua Debby CROME, MD     Critical care time: N/A              [1]  Allergies Allergen Reactions   Olmesartan      Hx of severe AKI requiring HD on ACE per renal note, hx of AKI and hypotension with olmesartan  07/2023   Mesalamine Rash    REACTION: Rash   "

## 2024-08-14 ENCOUNTER — Inpatient Hospital Stay (HOSPITAL_COMMUNITY): Admitting: Anesthesiology

## 2024-08-14 ENCOUNTER — Inpatient Hospital Stay (HOSPITAL_COMMUNITY)

## 2024-08-14 ENCOUNTER — Encounter (HOSPITAL_COMMUNITY)
Admission: EM | Disposition: A | Discharge status: Home / Self Care | Payer: Self-pay | Source: Home / Self Care | Attending: Family Medicine

## 2024-08-14 DIAGNOSIS — R590 Localized enlarged lymph nodes: Secondary | ICD-10-CM

## 2024-08-14 DIAGNOSIS — K922 Gastrointestinal hemorrhage, unspecified: Secondary | ICD-10-CM | POA: Diagnosis not present

## 2024-08-14 HISTORY — PX: VIDEO BRONCHOSCOPY WITH ENDOBRONCHIAL ULTRASOUND: SHX6177

## 2024-08-14 HISTORY — PX: CRYOTHERAPY: SHX6894

## 2024-08-14 HISTORY — PX: VIDEO BRONCHOSCOPY WITH ENDOBRONCHIAL NAVIGATION: SHX6175

## 2024-08-14 HISTORY — PX: BRONCHIAL NEEDLE ASPIRATION BIOPSY: SHX5106

## 2024-08-14 HISTORY — PX: VIDEO BRONCHOSCOPY WITH RADIAL ENDOBRONCHIAL ULTRASOUND: SHX6849

## 2024-08-14 LAB — GLUCOSE, CAPILLARY
Glucose-Capillary: 105 mg/dL — ABNORMAL HIGH (ref 70–99)
Glucose-Capillary: 111 mg/dL — ABNORMAL HIGH (ref 70–99)

## 2024-08-14 LAB — CBC
HCT: 30.6 % — ABNORMAL LOW (ref 39.0–52.0)
Hemoglobin: 10.5 g/dL — ABNORMAL LOW (ref 13.0–17.0)
MCH: 31 pg (ref 26.0–34.0)
MCHC: 34.3 g/dL (ref 30.0–36.0)
MCV: 90.3 fL (ref 80.0–100.0)
Platelets: 332 K/uL (ref 150–400)
RBC: 3.39 MIL/uL — ABNORMAL LOW (ref 4.22–5.81)
RDW: 14.2 % (ref 11.5–15.5)
WBC: 7 K/uL (ref 4.0–10.5)
nRBC: 0 % (ref 0.0–0.2)

## 2024-08-14 LAB — HEPARIN LEVEL (UNFRACTIONATED): Heparin Unfractionated: 0.33 [IU]/mL (ref 0.30–0.70)

## 2024-08-14 MED ORDER — MIDAZOLAM HCL 2 MG/2ML IJ SOLN
INTRAMUSCULAR | Status: AC
  Filled 2024-08-14: qty 2

## 2024-08-14 MED ORDER — DEXAMETHASONE SOD PHOSPHATE PF 10 MG/ML IJ SOLN
INTRAMUSCULAR | Status: DC | PRN
  Administered 2024-08-14: 10 mg via INTRAVENOUS

## 2024-08-14 MED ORDER — MIDAZOLAM HCL (PF) 2 MG/2ML IJ SOLN
INTRAMUSCULAR | Status: DC | PRN
  Administered 2024-08-14: 1 mg via INTRAVENOUS

## 2024-08-14 MED ORDER — CHLORHEXIDINE GLUCONATE 0.12 % MT SOLN
OROMUCOSAL | Status: AC
  Filled 2024-08-14: qty 15

## 2024-08-14 MED ORDER — SODIUM CHLORIDE 0.9 % IV SOLN: INTRAVENOUS | Status: DC | PRN

## 2024-08-14 MED ORDER — HEPARIN (PORCINE) 25000 UT/250ML-% IV SOLN
1050.0000 [IU]/h | INTRAVENOUS | Status: DC
  Administered 2024-08-16 – 2024-08-17 (×2): 1000 [IU]/h via INTRAVENOUS
  Administered 2024-08-18 – 2024-08-21 (×4): 1100 [IU]/h via INTRAVENOUS
  Filled 2024-08-14 (×7): qty 250

## 2024-08-14 MED ORDER — FENTANYL CITRATE (PF) 250 MCG/5ML IJ SOLN
INTRAMUSCULAR | Status: DC | PRN
  Administered 2024-08-14: 50 ug via INTRAVENOUS
  Administered 2024-08-14: 25 ug via INTRAVENOUS

## 2024-08-14 MED ORDER — CHLORHEXIDINE GLUCONATE 0.12 % MT SOLN
15.0000 mL | Freq: Once | OROMUCOSAL | Status: AC
  Administered 2024-08-14: 15 mL via OROMUCOSAL

## 2024-08-14 MED ORDER — VASOPRESSIN 20 UNIT/ML IV SOLN
INTRAVENOUS | Status: DC | PRN
  Administered 2024-08-14: 1 [IU] via INTRAVENOUS

## 2024-08-14 MED ORDER — FENTANYL CITRATE (PF) 100 MCG/2ML IJ SOLN
INTRAMUSCULAR | Status: AC
  Filled 2024-08-14: qty 2

## 2024-08-14 MED ORDER — PROPOFOL 10 MG/ML IV BOLUS
INTRAVENOUS | Status: DC | PRN
  Administered 2024-08-14: 100 ug/kg/min via INTRAVENOUS

## 2024-08-14 MED ORDER — PHENYLEPHRINE HCL-NACL 20-0.9 MG/250ML-% IV SOLN
INTRAVENOUS | Status: DC | PRN
  Administered 2024-08-14: 50 ug/min via INTRAVENOUS

## 2024-08-14 MED ORDER — ROCURONIUM BROMIDE 10 MG/ML (PF) SYRINGE
PREFILLED_SYRINGE | INTRAVENOUS | Status: DC | PRN
  Administered 2024-08-14: 10 mg via INTRAVENOUS
  Administered 2024-08-14: 50 mg via INTRAVENOUS

## 2024-08-14 MED ORDER — SUGAMMADEX SODIUM 200 MG/2ML IV SOLN
INTRAVENOUS | Status: DC | PRN
  Administered 2024-08-14: 200 mg via INTRAVENOUS

## 2024-08-14 MED ORDER — PHENYLEPHRINE 80 MCG/ML (10ML) SYRINGE FOR IV PUSH (FOR BLOOD PRESSURE SUPPORT)
PREFILLED_SYRINGE | INTRAVENOUS | Status: DC | PRN
  Administered 2024-08-14: 160 ug via INTRAVENOUS
  Administered 2024-08-14: 120 ug via INTRAVENOUS
  Administered 2024-08-14: 160 ug via INTRAVENOUS

## 2024-08-14 MED ORDER — ONDANSETRON HCL 4 MG/2ML IJ SOLN
INTRAMUSCULAR | Status: DC | PRN
  Administered 2024-08-14: 4 mg via INTRAVENOUS

## 2024-08-14 MED ORDER — LIDOCAINE 2% (20 MG/ML) 5 ML SYRINGE
INTRAMUSCULAR | Status: DC | PRN
  Administered 2024-08-14: 60 mg via INTRAVENOUS

## 2024-08-14 NOTE — Anesthesia Preprocedure Evaluation (Signed)
 "                                  Anesthesia Evaluation  Patient identified by MRN, date of birth, ID band Patient awake    Reviewed: Allergy & Precautions, NPO status , Patient's Chart, lab work & pertinent test results  History of Anesthesia Complications Negative for: history of anesthetic complications  Airway Mallampati: II  TM Distance: >3 FB Neck ROM: Full    Dental no notable dental hx. (+) Teeth Intact   Pulmonary shortness of breath, neg sleep apnea, neg COPD, Patient abstained from smoking.Not current smoker, former smoker    + decreased breath sounds      Cardiovascular Exercise Tolerance: Good METShypertension, + Peripheral Vascular Disease  (-) CAD and (-) Past MI (-) dysrhythmias  Rhythm:Regular Rate:Normal - Systolic murmurs    Neuro/Psych negative neurological ROS  negative psych ROS   GI/Hepatic ,neg GERD  ,,(+)     (-) substance abuse    Endo/Other  diabetes    Renal/GU CRFRenal disease     Musculoskeletal   Abdominal   Peds  Hematology   Anesthesia Other Findings Past Medical History: No date: Anemia     Comment:  anemia thrombocytopenia, saw Hematology 2011, can not               r/o myeloproliferative d/c 05/16/2013: Antithrombin III  deficiency     Comment:  On coumadin  for this No date: Blood clot in vein     Comment:  in right leg 05/27/2013: Chronic kidney disease     Comment:  ACUTE RENAL FAILURE  No date: Crohn's disease (HCC)     Comment:  tx. Imuran  6/11: GI bleed     Comment:  w/ normal EGD 6/11, 3 PRBCs  No date: Hearing loss     Comment:  wears hearing aid left ear; birthed with hearing loss 05/16/2013: HOH (hard of hearing) No date: Hypercalcemia 05/27/2013: Hyperkalemia No date: Hypertension 04-11-13: Ileostomy in place [facility])     Comment:  04-18-13 ileostomy to be taken down. 2001: Perianal abscess     Comment:  s/p right hemicolectomy, and drainage of retroperitoneal              abcess 2003 No  date: Peripheral vascular disease No date: Transfusion history     Comment:  last 8'13  Reproductive/Obstetrics                              Anesthesia Physical Anesthesia Plan  ASA: 3  Anesthesia Plan: General   Post-op Pain Management:    Induction: Intravenous  PONV Risk Score and Plan: 2 and Ondansetron  and Dexamethasone   Airway Management Planned: Oral ETT  Additional Equipment: None  Intra-op Plan:   Post-operative Plan: Extubation in OR  Informed Consent: I have reviewed the patients History and Physical, chart, labs and discussed the procedure including the risks, benefits and alternatives for the proposed anesthesia with the patient or authorized representative who has indicated his/her understanding and acceptance.     Dental advisory given  Plan Discussed with: CRNA and Surgeon  Anesthesia Plan Comments: (Discussed risks of anesthesia with patient, including PONV, sore throat, lip/dental/eye damage. Rare risks discussed as well, such as cardiorespiratory and neurological sequelae, and allergic reactions. Discussed the role of CRNA in patient's perioperative care. Patient understands.)  Anesthesia Quick Evaluation  "

## 2024-08-14 NOTE — Progress Notes (Signed)
 PHARMACY - ANTICOAGULATION CONSULT NOTE  Pharmacy Consult for heparin  Indication: antithrombin III  deficiency and Hx DVT; holding home warfarin  Allergies[1]  Patient Measurements: Height: 5' 9 (175.3 cm) Weight: 88.5 kg (195 lb 1.7 oz) IBW/kg (Calculated) : 70.7 HEPARIN  DW (KG): 87.3  Vital Signs: Temp: 99 F (37.2 C) (01/08 0732) Temp Source: Oral (01/08 0732) BP: 137/78 (01/08 0732) Pulse Rate: 99 (01/08 0732)  Labs: Recent Labs    08/12/24 0509 08/12/24 1616 08/13/24 0520 08/13/24 1106 08/14/24 0211  HGB 10.1*  --  10.2*  --  10.5*  HCT 29.4*  --  29.2*  --  30.6*  PLT 278  --  307  --  332  HEPARINUNFRC 0.88*   < > 0.47 0.23* 0.33   < > = values in this interval not displayed.    Estimated Creatinine Clearance: 87.9 mL/min (by C-G formula based on SCr of 0.91 mg/dL).   Medical History: Past Medical History:  Diagnosis Date   Anemia    anemia thrombocytopenia, saw Hematology 2011, can not r/o myeloproliferative d/c   Antithrombin III  deficiency 05/16/2013   On coumadin  for this   Blood clot in vein    in right leg   Chronic kidney disease 05/27/2013   ACUTE RENAL FAILURE    Crohn's disease (HCC)    tx. Imuran    GI bleed 6/11   w/ normal EGD 6/11, 3 PRBCs    Hearing loss    wears hearing aid left ear; birthed with hearing loss   HOH (hard of hearing) 05/16/2013   Hypercalcemia    Hyperkalemia 05/27/2013   Hypertension    Ileostomy in place [facility]) 04-11-13   04-18-13 ileostomy to be taken down.   Perianal abscess 2001   s/p right hemicolectomy, and drainage of retroperitoneal abcess 2003   Peripheral vascular disease    Transfusion history    last 8'13    Medications:  Scheduled:   allopurinol   300 mg Oral Daily   atorvastatin   40 mg Oral Daily   doxazosin   4 mg Oral QHS   melatonin  5 mg Oral QHS   pantoprazole  (PROTONIX ) IV  40 mg Intravenous Q12H   senna-docusate  1 tablet Oral BID   sodium chloride  flush  3 mL Intravenous Q12H   sodium  chloride flush  3 mL Intravenous Q12H   spironolactone   25 mg Oral Daily    Assessment: Patient is a 69 YO male admitted with melena/suspected GI bleeding. Patient on warfarin PTA. Pharmacy consulted to dose heparin  while warfarin on hold.  Patient is s/p EGD 1/5 with bleeding duodenal ulcer noted.  GI recs holding warfarin x 1 week.  Restarted on heparin , Hgb stable, monitor for rebleeding. Targeting lower end of goal range given risk for rebleed.  Heparin  level therapeutic (0.33) on heparin  at 800 units/hr.  Heparin  stopped this AM for lung nodule biopsy.  Will follow-up after procedure for timing of restart.  Goal of Therapy:  Heparin  level 0.3-0.5 units/ml Monitor platelets by anticoagulation protocol: Yes   Plan:  Follow-up after procedure for timing of heparin  restart.  [employer], Pharm.D., BCPS Clinical Pharmacist Clinical phone for 08/14/2024 from 7:30-3:00 is [phone].  **Pharmacist phone directory can be found on [email or web address] listed under [facility].  08/14/2024 7:37 AM            [1]  Allergies Allergen Reactions   Olmesartan      Hx of severe AKI requiring HD on ACE per renal note, hx of AKI and  hypotension with olmesartan  07/2023   Mesalamine Rash    REACTION: Rash

## 2024-08-14 NOTE — Anesthesia Postprocedure Evaluation (Signed)
"   Anesthesia Post Note  Patient: Rick Edwards  Procedure(s) Performed: VIDEO BRONCHOSCOPY WITH ENDOBRONCHIAL NAVIGATION (Left) BRONCHOSCOPY, WITH EBUS VIDEO BRONCHOSCOPY WITH RADIAL ENDOBRONCHIAL ULTRASOUND BRONCHOSCOPY, WITH NEEDLE ASPIRATION BIOPSY CRYOTHERAPY     Patient location during evaluation: PACU Anesthesia Type: General Level of consciousness: awake and alert, oriented and patient cooperative Pain management: pain level controlled Vital Signs Assessment: post-procedure vital signs reviewed and stable Respiratory status: spontaneous breathing, nonlabored ventilation and respiratory function stable Cardiovascular status: blood pressure returned to baseline and stable Postop Assessment: no apparent nausea or vomiting Anesthetic complications: no   No notable events documented.  Last Vitals:  Vitals:   08/14/24 1610 08/14/24 1620  BP: (!) 109/57 105/65  Pulse: (!) 112 100  Resp: 15 17  Temp:    SpO2: 95% 96%    Last Pain:  Vitals:   08/14/24 1620  TempSrc:   PainSc: 0-No pain                 [name]      "

## 2024-08-14 NOTE — Progress Notes (Signed)
 " PROGRESS NOTE    Rick Edwards  FMW:[id number] DOB: 03/03/1956 DOA: 08/07/2024 PCP: Joshua Debby CROME, MD   Brief Narrative:  Rick Edwards is a 69 year old male with extensive history of Antithrombin III  deficiency chronically on Coumadin , CKD, Crohn's disease, chronic anemia, hypercalcemic hyperkalemia HTN, ileostomy/takedown 2014, PVD,... Presented to ED with chief complaint of black-colored stool and shortness of breath and was found to have moderate size right pleural effusion and possible GI bleed.  GI consulted.  Status post EGD and due to oozing ulcer which was stopped.  Cytology from pleural fluid shows adenocarcinoma.  Oncology and pulmonology involved.  Details below.  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  FOBT ordered but is still pending.  Seen by GI, status post EGD 08/12/2023, was found to have oozing duodenal ulcer which was stopped by hemostatic spray.  GI recommended IV PPI to every 6 hours.  No further episodes of bleeding, hemoglobin is stable despite of starting on heparin .  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 GI plans to repeat EGD if no conclusion with lung biopsy.  Moderate right pleural effusion/adenocarcinoma of unknown primary: This is recurrent  issue for him.  Underwent thoracentesis by IR 08/09/2024 with a yield of 100 cc fluid.  Fluid analysis consistent with lymphocyte predominant exudative fluid. cytology came back positive for adenocarcinoma unfortunately.  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  Oncology consulted, they are also suspecting possible pulmonary concerns primary due to left upper lobe nodule, pulmonary consulted, patient is scheduled for biopsy today.  CEA pending.  Antithrombin 3 deficiency/supratherapeutic INR: Chronically anticoagulated on Coumadin , INR 2.7 upon arrival.  Received vitamin K  for reversal.  Currently remain on heparin .  GI prohibits from resuming Coumadin  for at least 1 week/until 08/18/2024.  Right renal mass: Multiple bilateral renal cysts, 1 including 2.1 cm right kidney possible RCC On a CT scan-EDP Dr. Elnor has discussed the findings with on-call urologist Does not recommend any inpatient further workup -to follow-up as an outpatient.  Type 2 diabetes mellitus: Last hemoglobin A1c 6.2, currently not on any medications.  Stage III CKD: At baseline.  History of chron's disease: Stable.  PVD: Continue statins.  Dyslipidemia: Continue statin.  DVT prophylaxis: SCDs Start: 08/08/24 1636 Place TED hose Start: 08/08/24 1636   Code Status: Full Code  Family Communication: None present at bedside.  Plan of care discussed with patient in length and he/she verbalized understanding and agreed with it.  Status is: Inpatient Remains inpatient appropriate because: Needs oncology evaluation and perhaps another EGD.   Estimated body mass index is 28.81 kg/m as calculated from the following:   Height as of this encounter: 5' 9 (1.753 m).   Weight as of this encounter: 88.5 kg.  Nutritional Assessment: Body mass index is 28.81 kg/m.[name] Seen by dietician.  I agree with the assessment and plan as outlined below: Nutrition Status:         . Skin Assessment: I have examined the patient's skin and I agree with the wound assessment as performed by the wound care RN as outlined below:    Consultants:  GI  Procedures:  None none  Antimicrobials:  Anti-infectives (From admission, onward)    None         Subjective: Seen and examined.  He has no complaints.  Denies any shortness of breath or abdominal pain.  He tells me that he had large bowel movement which was brown and not black.  He is fully aware of the fact that we are looking for primary for cancer.  Objective: Vitals:   08/13/24 1944 08/14/24 0445 08/14/24 0500 08/14/24 0732  BP: 120/75 128/81  137/78  Pulse: 97 93  99  Resp: 17 17  18   Temp: 99.2 F (37.3 C) 98.6 F (37 C)  99 F (37.2 C)  TempSrc: Oral Oral  Oral  SpO2: 93% 91%  91%  Weight:   88.5 kg   Height:        Intake/Output Summary (Last 24 hours) at 08/14/2024 0748 Last data filed at 08/13/2024 0910 Gross per 24 hour  Intake --  Output 300 ml  Net -300 ml   Filed Weights   08/11/24 0907 08/12/24 0533 08/14/24 0500  Weight: 87.3 kg 88.1 kg 88.5 kg    Examination:  General exam: Appears calm and comfortable  Respiratory system: Clear to auscultation. Respiratory effort normal. Cardiovascular system: S1 & S2 heard, RRR. No JVD, murmurs, rubs, gallops or clicks. No pedal edema. Gastrointestinal system: Abdomen is nondistended, soft and nontender. No organomegaly or masses felt. Normal bowel sounds heard. Central nervous system: Alert and oriented. No focal neurological deficits. Extremities: Symmetric 5 x 5 power. Skin: No rashes, lesions or ulcers.  Psychiatry: Judgement and insight appear normal. Mood & affect appropriate.    Data Reviewed: I have personally reviewed following labs and imaging studies  CBC: Recent Labs  Lab 08/07/24 1352 08/08/24 1037 08/10/24 1648 08/11/24 0224 08/12/24 0509 08/13/24 0520 08/14/24 0211  WBC 8.5   < > 9.2 8.6 8.0 6.6 7.0  NEUTROABS  5.9  --   --   --   --   --   --   HGB 13.2   < > 10.8* 10.2* 10.1* 10.2* 10.5*  HCT 39.5   < > 32.1* 30.1* 29.4* 29.2* 30.6*  MCV 92.1   < > 92.8 92.0 92.2 89.6 90.3  PLT 317   < > 307 284 278 307 332   < > = values in this interval not displayed.   Basic Metabolic Panel: Recent Labs  Lab 08/07/24 1352 08/08/24 1037 08/09/24 0830 08/10/24 0627  NA 138 136 139 140  K 4.2 4.1 4.0 4.0  CL 101 102 104 105  CO2 27 25 25 26   GLUCOSE 104* 106* 105* 107*  BUN 25* 36* 23 16  CREATININE 0.89 0.95 0.92 0.91  CALCIUM  9.4 9.0 8.7* 8.6*  MG  --   --  1.6*  --   PHOS  --   --  3.6  --    GFR: Estimated Creatinine Clearance: 87.9 mL/min (by C-G formula based on SCr of 0.91 mg/dL). Liver Function Tests: Recent Labs  Lab 08/07/24 1352  AST 17  ALT 27  ALKPHOS 87  BILITOT 0.9  PROT 6.6  ALBUMIN  3.8   No results for input(s): LIPASE, AMYLASE in the last 168 hours. No results for input(s): AMMONIA in the last 168 hours. Coagulation Profile: Recent Labs  Lab 08/08/24 1037 08/09/24 0830 08/10/24 0627 08/11/24 0224  INR 2.7* 3.3* 1.6* 1.4*   Cardiac Enzymes: No results for input(s): CKTOTAL, CKMB, CKMBINDEX, TROPONINI in the last 168 hours. BNP (last 3 results) Recent Labs    08/08/24 1227  PROBNP <50.0   HbA1C: No results for input(s): HGBA1C in the last 72 hours. CBG: Recent Labs  Lab 08/11/24 0752 08/12/24 0720 08/12/24 1121 08/12/24 1548 08/14/24 0730  GLUCAP 100* 85 102* 110* 105*   Lipid Profile: No results for input(s): CHOL, HDL, LDLCALC, TRIG, CHOLHDL, LDLDIRECT in the last 72 hours. Thyroid  Function Tests: No results for input(s): TSH, T4TOTAL, FREET4, T3FREE, THYROIDAB in the last 72 hours. Anemia Panel: No results for input(s): VITAMINB12, FOLATE, FERRITIN, TIBC, IRON, RETICCTPCT in the last 72 hours. Sepsis Labs: No results for input(s): PROCALCITON, LATICACIDVEN in the last 168 hours.  No  results found for this or any previous visit (from the past 240 hours).   Radiology Studies: No results found.   Scheduled Meds:  allopurinol   300 mg Oral Daily   atorvastatin   40 mg Oral Daily   doxazosin   4 mg Oral QHS   melatonin  5 mg Oral QHS   pantoprazole  (PROTONIX ) IV  40 mg Intravenous Q12H   senna-docusate  1 tablet Oral BID   sodium chloride  flush  3 mL Intravenous Q12H   sodium chloride  flush  3 mL Intravenous Q12H   spironolactone   25 mg Oral Daily   Continuous Infusions:      LOS: 5 days   Fredia Skeeter, MD [facility]  08/14/2024, 7:48 AM   *Please note that this is a verbal dictation therefore any spelling or grammatical errors are due to the Dragon Medical One system interpretation.  Please page via Amion and do not message via secure chat for urgent patient care matters. Secure chat can be used for non urgent patient care matters.  How to contact the [facility] Attending or Consulting provider 7A - 7P or covering provider during after hours 7P -7A, for this patient?  Check the care team in [facility] and look for a) attending/consulting [facility] provider listed and b) the [facility] team listed. Page or secure chat 7A-7P. Log into [email or web address] and use 's universal password to access. If you do not have the password, please contact the hospital operator. Locate the [facility] provider you are looking for under [facility] and page to a number that you can be directly reached. If you still have difficulty reaching the provider, please page the [facility] (Director on Call) for the Hospitalists listed on amion for assistance.  "

## 2024-08-14 NOTE — Progress Notes (Signed)
 PHARMACY - ANTICOAGULATION CONSULT NOTE  Pharmacy Consult for heparin  Indication: antithrombin III  deficiency and Hx DVT; holding home warfarin  Allergies[1]  Patient Measurements: Height: 5' 9 (175.3 cm) Weight: 88.5 kg (195 lb 1.7 oz) IBW/kg (Calculated) : 70.7 HEPARIN  DW (KG): 87.3  Vital Signs: Temp: 98.7 F (37.1 C) (01/08 1650) Temp Source: Oral (01/08 1650) BP: 104/66 (01/08 1650) Pulse Rate: 93 (01/08 1650)  Labs: Recent Labs    08/12/24 0509 08/12/24 1616 08/13/24 0520 08/13/24 1106 08/14/24 0211  HGB 10.1*  --  10.2*  --  10.5*  HCT 29.4*  --  29.2*  --  30.6*  PLT 278  --  307  --  332  HEPARINUNFRC 0.88*   < > 0.47 0.23* 0.33   < > = values in this interval not displayed.    Estimated Creatinine Clearance: 87.9 mL/min (by C-G formula based on SCr of 0.91 mg/dL).   Medical History: Past Medical History:  Diagnosis Date   Anemia    anemia thrombocytopenia, saw Hematology 2011, can not r/o myeloproliferative d/c   Antithrombin III  deficiency 05/16/2013   On coumadin  for this   Blood clot in vein    in right leg   Chronic kidney disease 05/27/2013   ACUTE RENAL FAILURE    Crohn's disease (HCC)    tx. Imuran    GI bleed 6/11   w/ normal EGD 6/11, 3 PRBCs    Hearing loss    wears hearing aid left ear; birthed with hearing loss   HOH (hard of hearing) 05/16/2013   Hypercalcemia    Hyperkalemia 05/27/2013   Hypertension    Ileostomy in place [facility]) 04-11-13   04-18-13 ileostomy to be taken down.   Perianal abscess 2001   s/p right hemicolectomy, and drainage of retroperitoneal abcess 2003   Peripheral vascular disease    Transfusion history    last 8'13    Medications:  Scheduled:   allopurinol   300 mg Oral Daily   atorvastatin   40 mg Oral Daily   doxazosin   4 mg Oral QHS   melatonin  5 mg Oral QHS   pantoprazole  (PROTONIX ) IV  40 mg Intravenous Q12H   senna-docusate  1 tablet Oral BID   sodium chloride  flush  3 mL Intravenous Q12H   sodium  chloride flush  3 mL Intravenous Q12H   spironolactone   25 mg Oral Daily    Assessment: Patient is a 69 YO male admitted with melena/suspected GI bleeding. Patient on warfarin PTA. Pharmacy consulted to dose heparin  while warfarin on hold.  Patient is s/p EGD 1/5 with bleeding duodenal ulcer noted.  GI recs holding warfarin x 1 week.  Restarted on heparin , Hgb stable, monitor for rebleeding. Targeting lower end of goal range given risk for rebleed.  Heparin  stopped this AM for lung nodule biopsy.    Heparin  infusion to restart 1/9 0800.  Goal of Therapy:  Heparin  level 0.3-0.5 units/ml Monitor platelets by anticoagulation protocol: Yes   Plan:  Resume heparin  infusion 800 units/hr at 1/9 0800 Heparin  level 6 hrs after and daily  Larraine Brazier, PharmD Clinical Pharmacist 08/14/2024  7:05 PM **Pharmacist phone directory can now be found on [email or web address] ([email or web address]).  Listed under [facility].               [1]  Allergies Allergen Reactions   Olmesartan      Hx of severe AKI requiring HD on ACE per renal note, hx of AKI and hypotension with olmesartan  07/2023  Mesalamine Rash    REACTION: Rash

## 2024-08-14 NOTE — Care Management Important Message (Signed)
 Important Message  Patient Details  Name: Rick Edwards MRN: [id number] Date of Birth: 05-27-1956   Important Message Given:  Yes - Medicare IM     Jennie Laneta Dragon 08/14/2024, 12:10 PM

## 2024-08-14 NOTE — Interval H&P Note (Signed)
 History and Physical Interval Note:  08/14/2024 1:50 PM  Rick Edwards  has presented today for surgery, with the diagnosis of left upper lobe lung nodule.  The various methods of treatment have been discussed with the patient and family. After consideration of risks, benefits and other options for treatment, the patient has consented to  Procedures: VIDEO BRONCHOSCOPY WITH ENDOBRONCHIAL NAVIGATION (Left) as a surgical intervention.  The patient's history has been reviewed, patient examined, no change in status, stable for surgery.  I have reviewed the patient's chart and labs.  Questions were answered to the patient's satisfaction.     Zola LOISE Herter

## 2024-08-14 NOTE — Op Note (Signed)
 Video Bronchoscopy with Endobronchial Ultrasound and Electromagnetic Navigation Procedure Note  Date of Operation: 08/14/2024  Pre-op Diagnosis: Lung nodule  Surgeon: Zola Herter, MD   Anesthesia: General endotracheal anesthesia  Operation: Flexible video fiberoptic bronchoscopy with endobronchial ultrasound, robotic assisted navigation and biopsies.  Estimated Blood Loss: Minimal  Complications: None  Indications and History: Rick Edwards is a 69 y.o. male with a right malignant pleural effusion with a left upper lobe nodule.  Recommendation was made to achieve a tissue diagnosis using endobronchial ultrasound and robotic assisted navigational bronchoscopy. The risks, benefits, complications, treatment options and expected outcomes were discussed with the patient.  The possibilities of pneumothorax, pneumonia, reaction to medication, pulmonary aspiration, perforation of a viscus, bleeding, failure to diagnose a condition and creating a complication requiring transfusion or operation were discussed with the patient who freely signed the consent.    Description of Procedure: The patient was seen in the Preoperative Area, was examined and was deemed appropriate to proceed.  The patient was taken to [facility] Endoscopy room 3, identified as Rick Edwards and the procedure verified as Flexible Video Fiberoptic Bronchoscopy with robotic assisted navigation and endobronchial ultrasound.  A Time Out was held and the above information confirmed.   Robotic assisted navigation: Prior to the date of the procedure a high-resolution CT scan of the chest was performed. Utilizing ION software program a virtual tracheobronchial tree was generated to allow the creation of distinct navigation pathways to the patient's parenchymal abnormalities. After being taken to the operating room general anesthesia was initiated and the patient  was orally intubated. The video fiberoptic bronchoscope was introduced via  the endotracheal tube and a general inspection was performed which showed normal right and left lung anatomy. Aspiration of the bilateral mainstems was completed to remove any remaining secretions. Robotic catheter inserted into patient's endotracheal tube.   Target #1 Left upper lobe: The distinct navigation pathways prepared prior to this procedure were then utilized to navigate to patient's lesion identified on CT scan. The robotic catheter was secured into place and the vision probe was withdrawn.  Lesion location was approximated using fluoroscopy.  Local registration and targeting was performed using [employer] Cios mobile C-arm three-dimensional imaging. Radial EBUS used to confirm lesion and a concentric view was obtained. Under fluoroscopic guidance transbronchial needle biopsies, and transbronchial cryobiopsies were performed to be sent for cytology and pathology.  Needle-in-lesion was confirmed using Cios mobile C-arm.     Endobronchial ultrasound: The robotic scope was then withdrawn and the endobronchial ultrasound was used to identify and characterize the peritracheal, hilar and bronchial lymph nodes. Inspection showed enlarged pathological stations 11L,7 and 11R. Using real-time ultrasound guidance Wang needle biopsies were take from Station 11L,7 and 11R nodes and were sent for cytology.   At the end of the procedure a general airway inspection was performed and there was no evidence of active bleeding. The bronchoscope was removed.  The patient tolerated the procedure well. There was no significant blood loss and there were no obvious complications. A post-procedural chest x-ray is pending.  Samples Target #1: 1.. Transbronchial Wang needle biopsies from left upper lobe nodule 2. Transbronchial cryobiopsies from left upper lobe nodule    EBUS Samples: 1. Wang needle biopsies from 11L node 2. Wang needle biopsies from 7 node 3. Wang needle biopsies from 11R  node    Zola Herter, MD 08/14/2024, 3:35 PM [facility]

## 2024-08-14 NOTE — Progress Notes (Signed)
 Just spoke with pharmacy, they will clarify with MD on when to restart Heparin  gtt.

## 2024-08-14 NOTE — Anesthesia Procedure Notes (Signed)
 Procedure Name: Intubation Date/Time: 08/14/2024 2:17 PM  Performed by: Worth Catherene Flores, CRNAPre-anesthesia Checklist: Patient identified, Emergency Drugs available, Suction available and Patient being monitored Patient Re-evaluated:Patient Re-evaluated prior to induction Oxygen Delivery Method: Circle system utilized Preoxygenation: Pre-oxygenation with 100% oxygen Induction Type: IV induction Ventilation: Mask ventilation without difficulty Laryngoscope Size: Mac and 4 Grade View: Grade II Tube type: Oral Tube size: 8.5 mm Number of attempts: 1 Airway Equipment and Method: Stylet and Oral airway Placement Confirmation: ETT inserted through vocal cords under direct vision, positive ETCO2 and breath sounds checked- equal and bilateral Secured at: 22 cm Tube secured with: Tape Dental Injury: Teeth and Oropharynx as per pre-operative assessment

## 2024-08-14 NOTE — Plan of Care (Signed)
  Problem: Education: Goal: Knowledge of General Education information will improve Description: Including pain rating scale, medication(s)/side effects and non-pharmacologic comfort measures Outcome: Not Progressing   Problem: Health Behavior/Discharge Planning: Goal: Ability to manage health-related needs will improve Outcome: Not Progressing   Problem: Clinical Measurements: Goal: Ability to maintain clinical measurements within normal limits will improve Outcome: Not Progressing Goal: Will remain free from infection Outcome: Not Progressing Goal: Diagnostic test results will improve Outcome: Not Progressing Goal: Respiratory complications will improve Outcome: Not Progressing Goal: Cardiovascular complication will be avoided Outcome: Not Progressing   Problem: Nutrition: Goal: Adequate nutrition will be maintained Outcome: Not Progressing   Problem: Coping: Goal: Level of anxiety will decrease Outcome: Not Progressing   Problem: Elimination: Goal: Will not experience complications related to bowel motility Outcome: Not Progressing Goal: Will not experience complications related to urinary retention Outcome: Not Progressing   Problem: Pain Managment: Goal: General experience of comfort will improve and/or be controlled Outcome: Not Progressing   Problem: Safety: Goal: Ability to remain free from injury will improve Outcome: Not Progressing   Problem: Skin Integrity: Goal: Risk for impaired skin integrity will decrease Outcome: Not Progressing

## 2024-08-14 NOTE — Transfer of Care (Signed)
 Immediate Anesthesia Transfer of Care Note  Patient: Rick Edwards  Procedure(s) Performed: VIDEO BRONCHOSCOPY WITH ENDOBRONCHIAL NAVIGATION (Left)  Patient Location: PACU  Anesthesia Type:General  Level of Consciousness: drowsy  Airway & Oxygen Therapy: Patient Spontanous Breathing and Patient connected to face mask oxygen  Post-op Assessment: Report given to RN and Post -op Vital signs reviewed and stable  Post vital signs: Reviewed and stable  Last Vitals:  Vitals Value Taken Time  BP 106/77 08/14/24 15:52  Temp 36.9 C 08/14/24 15:52  Pulse 111 08/14/24 15:53  Resp 16 08/14/24 15:53  SpO2 98 % 08/14/24 15:53  Vitals shown include unfiled device data.  Last Pain:  Vitals:   08/14/24 1552  TempSrc: Temporal  PainSc:       Patients Stated Pain Goal: 0 (08/12/24 2059)  Complications: No notable events documented.

## 2024-08-15 ENCOUNTER — Encounter (HOSPITAL_COMMUNITY): Payer: Self-pay

## 2024-08-15 ENCOUNTER — Ambulatory Visit: Admitting: Physician Assistant

## 2024-08-15 DIAGNOSIS — J910 Malignant pleural effusion: Secondary | ICD-10-CM

## 2024-08-15 DIAGNOSIS — R042 Hemoptysis: Secondary | ICD-10-CM | POA: Diagnosis not present

## 2024-08-15 DIAGNOSIS — R911 Solitary pulmonary nodule: Secondary | ICD-10-CM | POA: Diagnosis not present

## 2024-08-15 DIAGNOSIS — K922 Gastrointestinal hemorrhage, unspecified: Secondary | ICD-10-CM | POA: Diagnosis not present

## 2024-08-15 LAB — CBC
HCT: 29.4 % — ABNORMAL LOW (ref 39.0–52.0)
Hemoglobin: 10 g/dL — ABNORMAL LOW (ref 13.0–17.0)
MCH: 31.5 pg (ref 26.0–34.0)
MCHC: 34 g/dL (ref 30.0–36.0)
MCV: 92.7 fL (ref 80.0–100.0)
Platelets: 318 K/uL (ref 150–400)
RBC: 3.17 MIL/uL — ABNORMAL LOW (ref 4.22–5.81)
RDW: 14.3 % (ref 11.5–15.5)
WBC: 6.9 K/uL (ref 4.0–10.5)
nRBC: 0 % (ref 0.0–0.2)

## 2024-08-15 LAB — HEPARIN LEVEL (UNFRACTIONATED): Heparin Unfractionated: <0.1 [IU]/mL — ABNORMAL LOW (ref 0.30–0.70)

## 2024-08-15 LAB — CEA: CEA: 119 ng/mL — ABNORMAL HIGH (ref 0.0–4.7)

## 2024-08-15 LAB — GLUCOSE, CAPILLARY: Glucose-Capillary: 127 mg/dL — ABNORMAL HIGH (ref 70–99)

## 2024-08-15 NOTE — Addendum Note (Signed)
 Addendum  created 08/15/24 1101 by Boone Fess, MD   Attestation recorded in Intraprocedure, Flowsheet accepted, Intraprocedure Attestations filed

## 2024-08-15 NOTE — Progress Notes (Signed)
 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 Presented to ED with chief complaint of black-colored stool and shortness of breath and was found to have moderate size right pleural effusion and possible GI bleed.  GI consulted.  Status post EGD and due to oozing ulcer which was stopped.  Cytology from pleural fluid shows adenocarcinoma.  Oncology and pulmonology involved.  Details below.  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  FOBT ordered but is still pending.  Seen by GI, status post EGD 08/12/2023, was found to have oozing duodenal ulcer which was stopped by hemostatic spray.  GI recommended IV PPI to every 6 hours.  No further episodes of bleeding, hemoglobin is stable despite of starting on heparin .  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 GI plans to repeat EGD if no conclusion with lung biopsy.  [facility] will need to be contacted back if repeat  EGD is needed.  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  Underwent thoracentesis by IR 08/09/2024 with a yield of 100 cc fluid.  Fluid analysis consistent with lymphocyte predominant exudative fluid. cytology came back positive for adenocarcinoma unfortunately.  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  Oncology consulted, they are also suspecting possible pulmonary cancer primary due to left upper lobe nodule, pulmonary consulted, patient underwent video bronchoscopy with endobronchial ultrasound and electromagnetic navigation procedure 08/14/2024.  Cytology/pathology pending.  In the meantime, CEA came back significantly elevated.  Defer further management to oncology.  Antithrombin 3 deficiency/supratherapeutic INR: Chronically anticoagulated on Coumadin , INR 2.7 upon arrival.  Received vitamin K  for reversal.  Currently remain on heparin .  GI prohibits from resuming Coumadin  for at least 1 week/until 08/18/2024.  Right renal mass: Multiple bilateral renal cysts, 1 including 2.1 cm right kidney possible RCC On a CT scan-EDP Dr. Elnor has discussed the findings with on-call urologist Does not recommend any inpatient further workup -to follow-up as an outpatient.  Type 2 diabetes mellitus: Last hemoglobin A1c 6.2, currently not on any medications.  Stage III CKD: At baseline.  History of chron's disease: Stable.  PVD: Continue statins.  Dyslipidemia: Continue statin.  DVT prophylaxis: SCDs Start: 08/08/24 1636 Place TED hose Start: 08/08/24 1636   Code Status: Full Code  Family Communication: None present at bedside.  Plan of care discussed with patient in length and he/she verbalized understanding and agreed with it.  Status is: Inpatient Remains inpatient appropriate because: Needs  oncology evaluation and perhaps another EGD.   Estimated body mass index is 28.81 kg/m as  calculated from the following:   Height as of this encounter: 5' 9 (1.753 m).   Weight as of this encounter: 88.5 kg.    Nutritional Assessment: Body mass index is 28.81 kg/m.[name] Seen by dietician.  I agree with the assessment and plan as outlined below: Nutrition Status:        . Skin Assessment: I have examined the patient's skin and I agree with the wound assessment as performed by the wound care RN as outlined below:    Consultants:  GI  Procedures:  None none  Antimicrobials:  Anti-infectives (From admission, onward)    None         Subjective: Patient seen and examined, he was eating his lunch.  2 family members were at the bedside.  Patient denied any complaint such as shortness of breath, abdominal pain or nausea.  Earlier he was on oxygen and he said he was feeling short of breath but currently has no complaints and lungs are clear to auscultation.  Objective: Vitals:   08/14/24 1630 08/14/24 1650 08/14/24 2004 08/15/24 0238  BP: 111/64 104/66 130/77 132/78  Pulse: 99 93 93 87  Resp: 20 16 17 19   Temp:  98.7 F (37.1 C) 98.9 F (37.2 C) 98.6 F (37 C)  TempSrc:  Oral    SpO2: 93% 98% 98% 98%  Weight:      Height:        Intake/Output Summary (Last 24 hours) at 08/15/2024 0802 Last data filed at 08/15/2024 0324 Gross per 24 hour  Intake 1360 ml  Output 1220 ml  Net 140 ml   Filed Weights   08/11/24 0907 08/12/24 0533 08/14/24 0500  Weight: 87.3 kg 88.1 kg 88.5 kg    Examination:  General exam: Appears calm and comfortable  Respiratory system: Clear to auscultation. Respiratory effort normal. Cardiovascular system: S1 & S2 heard, RRR. No JVD, murmurs, rubs, gallops or clicks. No pedal edema. Gastrointestinal system: Abdomen is nondistended, soft and nontender. No organomegaly or masses felt. Normal bowel sounds heard. Central nervous system: Alert and oriented. No focal neurological deficits. Extremities: Symmetric 5 x 5 power. Skin: No  rashes, lesions or ulcers.  Psychiatry: Judgement and insight appear normal. Mood & affect appropriate.    Data Reviewed: I have personally reviewed following labs and imaging studies  CBC: Recent Labs  Lab 08/11/24 0224 08/12/24 0509 08/13/24 0520 08/14/24 0211 08/15/24 0537  WBC 8.6 8.0 6.6 7.0 6.9  HGB 10.2* 10.1* 10.2* 10.5* 10.0*  HCT 30.1* 29.4* 29.2* 30.6* 29.4*  MCV 92.0 92.2 89.6 90.3 92.7  PLT 284 278 307 332 318   Basic Metabolic Panel: Recent Labs  Lab 08/08/24 1037 08/09/24 0830 08/10/24 0627  NA 136 139 140  K 4.1 4.0 4.0  CL 102 104 105  CO2 25 25 26   GLUCOSE 106* 105* 107*  BUN 36* 23 16  CREATININE 0.95 0.92 0.91  CALCIUM  9.0 8.7* 8.6*  MG  --  1.6*  --   PHOS  --  3.6  --    GFR: Estimated Creatinine Clearance: 87.9 mL/min (by C-G formula based on SCr of 0.91 mg/dL). Liver Function Tests: No results for input(s): AST, ALT, ALKPHOS, BILITOT, PROT, ALBUMIN  in the last 168 hours.  No results for input(s): LIPASE, AMYLASE in the last 168 hours. No results for input(s): AMMONIA in the last 168 hours. Coagulation Profile: Recent Labs  Lab 08/08/24 1037 08/09/24 0830 08/10/24 0627 08/11/24 0224  INR 2.7* 3.3* 1.6* 1.4*   Cardiac Enzymes: No results for input(s): CKTOTAL, CKMB, CKMBINDEX, TROPONINI in the last 168 hours. BNP (last 3 results) Recent Labs    08/08/24 1227  PROBNP <50.0   HbA1C: No results for input(s): HGBA1C in the last 72 hours. CBG: Recent Labs  Lab 08/12/24 0720 08/12/24 1121 08/12/24 1548 08/14/24 0730 08/14/24 1137  GLUCAP 85 102* 110* 105* 111*   Lipid Profile: No results for input(s): CHOL, HDL, LDLCALC, TRIG, CHOLHDL, LDLDIRECT in the last 72 hours. Thyroid  Function Tests: No results for input(s): TSH, T4TOTAL, FREET4, T3FREE, THYROIDAB in the last 72 hours. Anemia Panel: No results for input(s): VITAMINB12, FOLATE, FERRITIN, TIBC, IRON,  RETICCTPCT in the last 72 hours. Sepsis Labs: No results for input(s): PROCALCITON, LATICACIDVEN in the last 168 hours.  No results found for this or any previous visit (from the past 240 hours).   Radiology Studies: DG Chest Port 1 View Result Date: 08/14/2024 EXAM: 1 VIEW(S) XRAY OF THE CHEST 08/14/2024 05:26:00 PM COMPARISON: 08/09/2024 CLINICAL HISTORY: S/P bronchoscopy FINDINGS: LUNGS AND PLEURA: Moderate right pleural effusion with interval increase. Stable right lung base opacity. No pneumothorax. HEART AND MEDIASTINUM: No acute abnormality of the cardiac and mediastinal silhouettes. BONES AND SOFT TISSUES: No acute osseous abnormality. IMPRESSION: 1. Moderate right pleural effusion with interval increase. 2. Stable right lung base opacity likely pneumonia and/or compressive atelectasis . Electronically signed by: Elsie Gravely MD 08/14/2024 05:40 PM EST RP Workstation: [id number]   DG C-ARM BRONCHOSCOPY Result Date: 08/14/2024 C-ARM BRONCHOSCOPY: Fluoroscopy was utilized by the requesting physician.  No radiographic interpretation.     Scheduled Meds:  allopurinol   300 mg Oral Daily   atorvastatin   40 mg Oral Daily   doxazosin   4 mg Oral QHS   melatonin  5 mg Oral QHS   pantoprazole  (PROTONIX ) IV  40 mg Intravenous Q12H   senna-docusate  1 tablet Oral BID   sodium chloride  flush  3 mL Intravenous Q12H   sodium chloride  flush  3 mL Intravenous Q12H   spironolactone   25 mg Oral Daily   Continuous Infusions:  heparin  800 Units/hr (08/15/24 0743)       LOS: 6 days   Fredia Skeeter, MD [facility]  08/15/2024, 8:02 AM   *Please note that this is a verbal dictation therefore any spelling or grammatical errors are due to the Dragon Medical One system interpretation.  Please page via Amion and do not message via secure chat for urgent patient care matters. Secure chat can be used for non urgent patient care matters.  How to contact the [facility] Attending or Consulting  provider 7A - 7P or covering provider during after hours 7P -7A, for this patient?  Check the care team in [facility] and look for a) attending/consulting [facility] provider listed and b) the [facility] team listed. Page or secure chat 7A-7P. Log into [email or web address] and use [facility]'s universal password to access. If you do not have the password, please contact the hospital operator. Locate the [facility] provider you are looking for under [facility] and page to a number that you can be directly reached. If you still have difficulty reaching the provider, please page the [facility] (Director on Call) for the Hospitalists listed on amion for assistance.  "

## 2024-08-15 NOTE — Progress Notes (Signed)
 Physical Therapy Treatment Patient Details Name: Rick Edwards MRN: [id number] DOB: 10-Oct-1955 Today's Date: 08/15/2024   History of Present Illness 69 year old male Presented to ED 08/07/24 with chief complaint of black-colored stool. +GIB, R pleural effusion, R renal mass, 1/3 thoracentesis with vagal episode with BP 76/50, cytology positive for adenocarcinoma. Bronch with LUL nodule biopsy on 1/8. PMH significant of: Antithrombin III  deficiency chronically on Coumadin , CKD, Crohn's disease, chronic anemia, hypercalcemic hyperkalemia HTN, ileostomy/takedown 2014, PVD, DM    PT Comments  Pt tolerates treatment well, ambulating for increased distances and progressing to mobility without UE support of DME. Pt denies symptoms during session with BP more stable than in previous therapy sessions. Pt will benefit from continued frequent mobilization in an effort to improve activity tolerance and to restore the pt's prior level of function. No post-acute PT needs anticipated.   If plan is discharge home, recommend the following: Assistance with cooking/housework;Assist for transportation;Help with stairs or ramp for entrance   Can travel by private vehicle        Equipment Recommendations  None recommended by PT    Recommendations for Other Services       Precautions / Restrictions Precautions Precautions: Fall Recall of Precautions/Restrictions: Intact Precaution/Restrictions Comments: orthostasis during admission Restrictions Weight Bearing Restrictions Per Provider Order: No     Mobility  Bed Mobility Overal bed mobility: Modified Independent                  Transfers Overall transfer level: Independent Equipment used: None                    Ambulation/Gait Ambulation/Gait assistance: Supervision Gait Distance (Feet): 200 Feet (additional 100' without DME) Assistive device: None, IV Pole Gait Pattern/deviations: Step-through pattern Gait velocity:  reduced Gait velocity interpretation: 1.31 - 2.62 ft/sec, indicative of limited community ambulator   General Gait Details: steady step-through gait, no considerable balance deviations noted   Stairs             Wheelchair Mobility     Tilt Bed    Modified Rankin (Stroke Patients Only)       Balance Overall balance assessment: Needs assistance Sitting-balance support: No upper extremity supported, Feet supported Sitting balance-Leahy Scale: Good     Standing balance support: No upper extremity supported, During functional activity Standing balance-Leahy Scale: Fair                              Hotel Manager: Impaired Factors Affecting Communication: Hearing impaired  Cognition Arousal: Alert Behavior During Therapy: WFL for tasks assessed/performed   PT - Cognitive impairments: Difficult to assess (pt hard of hearing, difficult to discern if pt has slowed processing or if this is related to hearing impairment) Difficult to assess due to: Impaired communication                     PT - Cognition Comments: hearing impaired Following commands: Intact      Cueing Cueing Techniques: Verbal cues  Exercises      General Comments General comments (skin integrity, edema, etc.): pt on 2L [place] upon PT arrival, pt weaned to room air and gradually desaturates to 88% when ambulating. At rest on room air the pt recovers to 94%. RN made aware and pt left on room air. BP in sitting 143/101, standing after initial ambulation bout is 130/78      Pertinent Vitals/Pain  Pain Assessment Pain Assessment: No/denies pain    Home Living                          Prior Function            PT Goals (current goals can now be found in the care plan section) Acute Rehab PT Goals Patient Stated Goal: Be able to go home Progress towards PT goals: Progressing toward goals    Frequency    Min 2X/week      PT Plan       Co-evaluation              AM-PAC PT 6 Clicks Mobility   Outcome Measure  Help needed turning from your back to your side while in a flat bed without using bedrails?: None Help needed moving from lying on your back to sitting on the side of a flat bed without using bedrails?: None Help needed moving to and from a bed to a chair (including a wheelchair)?: None Help needed standing up from a chair using your arms (e.g., wheelchair or bedside chair)?: None Help needed to walk in hospital room?: A Little Help needed climbing 3-5 steps with a railing? : A Little 6 Click Score: 22    End of Session Equipment Utilized During Treatment: Gait belt Activity Tolerance: Patient tolerated treatment well Patient left: in bed;with call bell/phone within reach Nurse Communication: Mobility status PT Visit Diagnosis: Difficulty in walking, not elsewhere classified (R26.2);Dizziness and giddiness (R42)     Time: 8945-8886 PT Time Calculation (min) (ACUTE ONLY): 19 min  Charges:    $Gait Training: 8-22 mins PT General Charges $$ ACUTE PT VISIT: 1 Visit                     Bernardino JINNY Ruth, PT, DPT Acute Rehabilitation Office [phone]    Bernardino JINNY Ruth 08/15/2024, 11:49 AM

## 2024-08-15 NOTE — Consult Note (Signed)
 "  NAME:  Rick Edwards, MRN:  [id number], DOB:  06-28-1956, LOS: 6 ADMISSION DATE:  08/07/2024, CONSULTATION DATE:  08/13/24 REFERRING MD:  Federico, CHIEF COMPLAINT:  dark stool   History of Present Illness:  69 year old man with fairly complex PMH including AT3 deficiency on AC, Crohn's, previous ileostomy p/w black stools and SOB.  Workup revealed R effusion, duodenal ulcer (sprayed 1/5).  Effusion returned as adenocarcinoma with staining c/w upper GI source.  GI concerned that duodenal ulcer biopsy would result in further bleeding so pulmonary asked to consider biopsy of a LUL lung nodule.  Patient states breathing improved after thora.  He denies any smoking history or hemoptysis.  Does have some nonspecific weight loss.  Pertinent  Medical History   Past Medical History:  Diagnosis Date   Anemia    anemia thrombocytopenia, saw Hematology 2011, can not r/o myeloproliferative d/c   Antithrombin III  deficiency 05/16/2013   On coumadin  for this   Blood clot in vein    in right leg   Chronic kidney disease 05/27/2013   ACUTE RENAL FAILURE    Crohn's disease (HCC)    tx. Imuran    GI bleed 6/11   w/ normal EGD 6/11, 3 PRBCs    Hearing loss    wears hearing aid left ear; birthed with hearing loss   HOH (hard of hearing) 05/16/2013   Hypercalcemia    Hyperkalemia 05/27/2013   Hypertension    Ileostomy in place [facility]) 04-11-13   04-18-13 ileostomy to be taken down.   Perianal abscess 2001   s/p right hemicolectomy, and drainage of retroperitoneal abcess 2003   Peripheral vascular disease    Transfusion history    last 8'13     Significant Hospital Events: Including procedures, antibiotic start and stop dates in addition to other pertinent events     Interim History / Subjective:  S/p navigational and EBUS bronchoscopy 1/8 Yesterday has blood mixed in sputum. No further hemoptysis today Objective    Blood pressure 127/79, pulse 82, temperature 98.5 F (36.9 C), resp. rate 19,  height 5' 9 (1.753 m), weight 88.5 kg, SpO2 100%.        Intake/Output Summary (Last 24 hours) at 08/15/2024 1529 Last data filed at 08/15/2024 0324 Gross per 24 hour  Intake 1360 ml  Output 1220 ml  Net 140 ml   Filed Weights   08/11/24 0907 08/12/24 0533 08/14/24 0500  Weight: 87.3 kg 88.1 kg 88.5 kg   Physical Exam: General: Well-appearing, no acute distress HENT: [place], AT Eyes: EOMI, no scleral icterus Respiratory: Clear to auscultation bilaterally.  No crackles, wheezing or rales Cardiovascular: RRR, -M/R/G, no JVD Extremities:-Edema,-tenderness Neuro: AAO x4, CNII-XII grossly intact Psych: Normal mood, normal affect  Imaging, labs and test in EMR in the last 24 hours reviewed independently by me. Pertinent findings below: CT CAP 1/2 - visualized lung fields with right pleural effusion. LUL 1.4 x 1.1 cm LUL lesion  Resolved problem list   Assessment and Plan  LUL lung nodule Right malignant pleural effusion Post-procedural hemoptysis - resolved S/p IR thoracentesis 08/09/24 with 100 cc removed. Predominantly lymphocytic with malignant cells present favoring adenocarcinoma favoring upper GI or pancreaticobiliary origin S/p navigational bronchoscopy of LUL 08/14/24 with EBUS of station 11L, 7, 11R  --F/u cytology  Pulmonary available as needed Labs   CBC: Recent Labs  Lab 08/11/24 0224 08/12/24 0509 08/13/24 0520 08/14/24 0211 08/15/24 0537  WBC 8.6 8.0 6.6 7.0 6.9  HGB 10.2* 10.1*  10.2* 10.5* 10.0*  HCT 30.1* 29.4* 29.2* 30.6* 29.4*  MCV 92.0 92.2 89.6 90.3 92.7  PLT 284 278 307 332 318    Basic Metabolic Panel: Recent Labs  Lab 08/09/24 0830 08/10/24 0627  NA 139 140  K 4.0 4.0  CL 104 105  CO2 25 26  GLUCOSE 105* 107*  BUN 23 16  CREATININE 0.92 0.91  CALCIUM  8.7* 8.6*  MG 1.6*  --   PHOS 3.6  --    GFR: Estimated Creatinine Clearance: 87.9 mL/min (by C-G formula based on SCr of 0.91 mg/dL). Recent Labs  Lab 08/12/24 0509 08/13/24 0520  08/14/24 0211 08/15/24 0537  WBC 8.0 6.6 7.0 6.9    Liver Function Tests: No results for input(s): AST, ALT, ALKPHOS, BILITOT, PROT, ALBUMIN  in the last 168 hours.  No results for input(s): LIPASE, AMYLASE in the last 168 hours. No results for input(s): AMMONIA in the last 168 hours.  ABG    Component Value Date/Time   PHART 7.506 (H) 05/17/2013 0430   PCO2ART 36.9 05/17/2013 0430   PO2ART 78.4 (L) 05/17/2013 0430   HCO3 29.0 (H) 05/17/2013 0430   TCO2 30.1 05/17/2013 0430   O2SAT 95.9 05/17/2013 0430     Coagulation Profile: Recent Labs  Lab 08/09/24 0830 08/10/24 0627 08/11/24 0224  INR 3.3* 1.6* 1.4*    Cardiac Enzymes: No results for input(s): CKTOTAL, CKMB, CKMBINDEX, TROPONINI in the last 168 hours.  HbA1C: Hgb A1c MFr Bld  Date/Time Value Ref Range Status  05/13/2024 10:09 AM 6.2 4.6 - 6.5 % Final    Comment:    Glycemic Control Guidelines for People with Diabetes:Non Diabetic:  <6%Goal of Therapy: <7%Additional Action Suggested:  >8%   06/12/2023 10:17 AM 5.9 4.6 - 6.5 % Final    Comment:    Glycemic Control Guidelines for People with Diabetes:Non Diabetic:  <6%Goal of Therapy: <7%Additional Action Suggested:  >8%     CBG: Recent Labs  Lab 08/12/24 1121 08/12/24 1548 08/14/24 0730 08/14/24 1137 08/15/24 0816  GLUCAP 102* 110* 105* 111* 127*    Review of Systems:    Positive Symptoms in bold:  Constitutional fevers, chills, weight loss, fatigue, anorexia, malaise  Eyes decreased vision, double vision, eye irritation  Ears, Nose, Mouth, Throat sore throat, trouble swallowing, sinus congestion  Cardiovascular chest pain, paroxysmal nocturnal dyspnea, lower ext edema, palpitations   Respiratory SOB, cough, DOE, hemoptysis, wheezing  Gastrointestinal nausea, vomiting, diarrhea  Genitourinary burning with urination, trouble urinating  Musculoskeletal joint aches, joint swelling, back pain  Integumentary  rashes, skin  lesions  Neurological focal weakness, focal numbness, trouble speaking, headaches  Psychiatric depression, anxiety, confusion  Endocrine polyuria, polydipsia, cold intolerance, heat intolerance  Hematologic abnormal bruising, abnormal bleeding, unexplained nose bleeds  Allergic/Immunologic recurrent infections, hives, swollen lymph nodes     Past Medical History:  He,  has a past medical history of Anemia, Antithrombin III  deficiency (05/16/2013), Blood clot in vein, Chronic kidney disease (05/27/2013), Crohn's disease (HCC), GI bleed (6/11), Hearing loss, HOH (hard of hearing) (05/16/2013), Hypercalcemia, Hyperkalemia (05/27/2013), Hypertension, Ileostomy in place [facility]) (04-11-13), Perianal abscess (2001), Peripheral vascular disease, and Transfusion history.   Surgical History:   Past Surgical History:  Procedure Laterality Date   Anal fistulotomy  2002   BRONCHIAL NEEDLE ASPIRATION BIOPSY  08/14/2024   Procedure: BRONCHOSCOPY, WITH NEEDLE ASPIRATION BIOPSY;  Surgeon: Zaida Zola SAILOR, MD;  Location: [facility];  Service: Pulmonary;;   CRYOTHERAPY  08/14/2024   Procedure: CRYOTHERAPY;  Surgeon: Zaida,  Zola SAILOR, MD;  Location: [facility];  Service: Pulmonary;;   EMBOLECTOMY  03/12/2012   Procedure: EMBOLECTOMY;  Surgeon: Lonni GORMAN Blade, MD;  Location: [facility] OR;  Service: Vascular;  Laterality: Right;  Right popliteal embolectomy with vein patch angioplasty, right posterior tibial embolectomy with vein patch angioplasty    ESOPHAGOGASTRODUODENOSCOPY N/A 12/12/2023   Procedure: EGD (ESOPHAGOGASTRODUODENOSCOPY);  Surgeon: Burnette Fallow, MD;  Location: [facility];  Service: Gastroenterology;  Laterality: N/A;   ESOPHAGOGASTRODUODENOSCOPY N/A 08/11/2024   Procedure: EGD (ESOPHAGOGASTRODUODENOSCOPY);  Surgeon: Dianna Specking, MD;  Location: [facility] ENDOSCOPY;  Service: Gastroenterology;  Laterality: N/A;   HEMICOLECTOMY  08/29/2001   perforated abscess   HEMOSTASIS CLIP PLACEMENT  08/11/2024    Procedure: CONTROL OF HEMORRHAGE, GI TRACT, ENDOSCOPIC, BY HEMOSTATIC POWDER APPLICATION;  Surgeon: Dianna Specking, MD;  Location: [facility] ENDOSCOPY;  Service: Gastroenterology;;   ILEOSTOMY CLOSURE N/A 04/18/2013   Procedure: ILEOSTOMY TAKEDOWN;  Surgeon: Morene ONEIDA Olives, MD;  Location: [facility] ORS;  Service: General;  Laterality: N/A;   INTRAOPERATIVE ARTERIOGRAM  03/12/2012   Procedure: INTRA OPERATIVE ARTERIOGRAM;  Surgeon: Lonni GORMAN Blade, MD;  Location: [facility] OR;  Service: Vascular;  Laterality: Right;   PARTIAL COLECTOMY  03/11/2012   Procedure: PARTIAL COLECTOMY;  Surgeon: Morene ONEIDA Olives, MD;  Location: [facility] ORS;  Service: General;  Laterality: N/A;  subtotal colectomy transverse and left colon    TRANSMETATARSAL AMPUTATION  05/10/2012   right   UPPER ESOPHAGEAL ENDOSCOPIC ULTRASOUND (EUS) N/A 12/12/2023   Procedure: UPPER ESOPHAGEAL ENDOSCOPIC ULTRASOUND (EUS);  Surgeon: Burnette Fallow, MD;  Location: [facility];  Service: Gastroenterology;  Laterality: N/A;   VIDEO BRONCHOSCOPY WITH ENDOBRONCHIAL NAVIGATION Left 08/14/2024   Procedure: VIDEO BRONCHOSCOPY WITH ENDOBRONCHIAL NAVIGATION;  Surgeon: Zaida Zola SAILOR, MD;  Location: [facility];  Service: Pulmonary;  Laterality: Left;   VIDEO BRONCHOSCOPY WITH ENDOBRONCHIAL ULTRASOUND  08/14/2024   Procedure: BRONCHOSCOPY, WITH EBUS;  Surgeon: Zaida Zola SAILOR, MD;  Location: [facility];  Service: Pulmonary;;   VIDEO BRONCHOSCOPY WITH RADIAL ENDOBRONCHIAL ULTRASOUND  08/14/2024   Procedure: VIDEO BRONCHOSCOPY WITH RADIAL ENDOBRONCHIAL ULTRASOUND;  Surgeon: Zaida Zola SAILOR, MD;  Location: [facility];  Service: Pulmonary;;     Social History:   reports that he quit smoking about 13 years ago. His smoking use included cigarettes. He started smoking about 22 years ago. He has a 2.3 pack-year smoking history. He has never used smokeless tobacco. He reports that he does not currently use drugs after having used the following drugs: Marijuana. He  reports that he does not drink alcohol.   Family History:  His family history includes Heart disease in his father; Hyperlipidemia in his father and mother; Hypertension in his father, mother, and another family member; Stroke in an other family member. There is no history of Coronary artery disease, Diabetes, Colon cancer, Prostate cancer, or Kidney failure.   Allergies Allergies[1]   Home Medications  Prior to Admission medications  Medication Sig Start Date End Date Taking? Authorizing Provider  allopurinol  (ZYLOPRIM ) 300 MG tablet TAKE 1 TABLET BY MOUTH EVERY DAY 07/21/24  Yes Joshua Debby CROME, MD  amLODipine  (NORVASC ) 10 MG tablet TAKE 1 TABLET BY MOUTH EVERYDAY AT BEDTIME 07/21/24  Yes Joshua Debby CROME, MD  atorvastatin  (LIPITOR) 40 MG tablet TAKE 1 TABLET BY MOUTH EVERY DAY 07/11/24  Yes Joshua Debby CROME, MD  doxazosin  (CARDURA ) 4 MG tablet Take 1 tablet (4 mg total) by mouth at bedtime. 05/26/24  Yes Joshua Debby CROME, MD  omega-3 acid ethyl esters (LOVAZA ) 1  g capsule Take 2 g by mouth 2 (two) times daily.   Yes [provider]  spironolactone  (ALDACTONE ) 25 MG tablet Take 1 tablet (25 mg total) by mouth daily. 10/26/23  Yes Joshua Debby CROME, MD  warfarin (COUMADIN ) 5 MG tablet TAKE 1 1/2 TABLETS BY MOUTH DAILY EXCEPT TAKE 1 TABLET ON TUESDAY, THURSDAY AND SATURDAY OR AS DIRECTED BY CLINIC. 07/21/24  Yes Joshua Debby CROME, MD  albuterol  (VENTOLIN  HFA) 108 (90 Base) MCG/ACT inhaler Inhale 1-2 puffs into the lungs every 6 (six) hours as needed for wheezing or shortness of breath. Patient not taking: Reported on 08/08/2024 07/23/24   Reddick, Johnathan B, NP  amoxicillin -clavulanate (AUGMENTIN ) 875-125 MG tablet Take 1 tablet by mouth every 12 (twelve) hours. Patient not taking: Reported on 08/07/2024 07/19/24   Minnie Tinnie BRAVO, PA  carvedilol  (COREG ) 25 MG tablet TAKE 1 TABLET (25 MG TOTAL) BY MOUTH TWICE A DAY WITH MEALS 08/11/24   Joshua Debby CROME, MD  hyoscyamine (LEVSIN SL) 0.125 MG SL  tablet Place under the tongue 2 (two) times daily as needed. Patient not taking: No sig reported 02/25/20   [provider]  triamcinolone  cream (KENALOG ) 0.5 % Apply 1 Application topically 3 (three) times daily. Patient not taking: Reported on 08/07/2024 06/12/23   Joshua Debby CROME, MD  VASCEPA  1 g capsule TAKE 2 CAPSULES BY MOUTH 2 TIMES DAILY. Patient not taking: Reported on 08/08/2024 11/05/23   Joshua Debby CROME, MD     Critical care time: N/A    Care Time: 25 min  Slater Staff, M.D. [facility] Pulmonary/Critical Care Medicine 08/15/2024 3:45 PM   See Amion for personal pager For hours between 7 PM to 7 AM, please call Elink for urgent questions           [1]  Allergies Allergen Reactions   Olmesartan      Hx of severe AKI requiring HD on ACE per renal note, hx of AKI and hypotension with olmesartan  07/2023   Mesalamine Rash    REACTION: Rash   "

## 2024-08-15 NOTE — Progress Notes (Signed)
 PHARMACY - ANTICOAGULATION CONSULT NOTE  Pharmacy Consult for heparin  Indication: antithrombin III  deficiency and Hx DVT; holding home warfarin  Allergies[1]  Patient Measurements: Height: 5' 9 (175.3 cm) Weight: 88.5 kg (195 lb 1.7 oz) IBW/kg (Calculated) : 70.7 HEPARIN  DW (KG): 87.3  Vital Signs: Temp: 98.2 F (36.8 C) (01/09 1722) BP: 113/72 (01/09 1722) Pulse Rate: 89 (01/09 1722)  Labs: Recent Labs    08/13/24 0520 08/13/24 1106 08/14/24 0211 08/15/24 0537 08/15/24 1435  HGB 10.2*  --  10.5* 10.0*  --   HCT 29.2*  --  30.6* 29.4*  --   PLT 307  --  332 318  --   HEPARINUNFRC 0.47 0.23* 0.33  --  <0.10*    Estimated Creatinine Clearance: 87.9 mL/min (by C-G formula based on SCr of 0.91 mg/dL).   Medical History: Past Medical History:  Diagnosis Date   Anemia    anemia thrombocytopenia, saw Hematology 2011, can not r/o myeloproliferative d/c   Antithrombin III  deficiency 05/16/2013   On coumadin  for this   Blood clot in vein    in right leg   Chronic kidney disease 05/27/2013   ACUTE RENAL FAILURE    Crohn's disease (HCC)    tx. Imuran    GI bleed 6/11   w/ normal EGD 6/11, 3 PRBCs    Hearing loss    wears hearing aid left ear; birthed with hearing loss   HOH (hard of hearing) 05/16/2013   Hypercalcemia    Hyperkalemia 05/27/2013   Hypertension    Ileostomy in place [facility]) 04-11-13   04-18-13 ileostomy to be taken down.   Perianal abscess 2001   s/p right hemicolectomy, and drainage of retroperitoneal abcess 2003   Peripheral vascular disease    Transfusion history    last 8'13    Medications:  Scheduled:   allopurinol   300 mg Oral Daily   atorvastatin   40 mg Oral Daily   doxazosin   4 mg Oral QHS   melatonin  5 mg Oral QHS   pantoprazole  (PROTONIX ) IV  40 mg Intravenous Q12H   senna-docusate  1 tablet Oral BID   sodium chloride  flush  3 mL Intravenous Q12H   sodium chloride  flush  3 mL Intravenous Q12H   spironolactone   25 mg Oral Daily     Assessment: Patient is a 69 YO male admitted with melena/suspected GI bleeding. Patient on warfarin PTA. Pharmacy consulted to dose heparin  while warfarin on hold.  Patient is s/p EGD 1/5 with bleeding duodenal ulcer noted.  GI recs holding warfarin x 1 week.  Restarted on heparin , Hgb stable, monitor for rebleeding. Targeting lower end of goal range given risk for rebleed.  Heparin  was held 1/8 for lung nodule biopsy and resumed 1/9 AM.   PM: Heparin  level <0.10 after resuming heparin  at 800 units/hr. CBC has remained stable.  Per RN,   Goal of Therapy:  Heparin  level 0.3-0.5 units/ml Monitor platelets by anticoagulation protocol: Yes   Plan:  Increase Heparin  to 950 units/hr.  Recheck heparin  level in 6 hours.   Harlene Boga, PharmD Please refer to [facility] for [facility] Pharmacy numbers 08/15/2024  6:00 PM **Pharmacist phone directory can now be found on [email or web address] ([email or web address]).  Listed under [facility].               [1]  Allergies Allergen Reactions   Olmesartan      Hx of severe AKI requiring HD on ACE per renal note, hx of AKI and hypotension with olmesartan  07/2023  Mesalamine Rash    REACTION: Rash

## 2024-08-16 DIAGNOSIS — D6859 Other primary thrombophilia: Secondary | ICD-10-CM

## 2024-08-16 DIAGNOSIS — E118 Type 2 diabetes mellitus with unspecified complications: Secondary | ICD-10-CM | POA: Diagnosis not present

## 2024-08-16 DIAGNOSIS — K922 Gastrointestinal hemorrhage, unspecified: Secondary | ICD-10-CM | POA: Diagnosis not present

## 2024-08-16 DIAGNOSIS — I739 Peripheral vascular disease, unspecified: Secondary | ICD-10-CM | POA: Diagnosis not present

## 2024-08-16 DIAGNOSIS — E785 Hyperlipidemia, unspecified: Secondary | ICD-10-CM | POA: Diagnosis not present

## 2024-08-16 DIAGNOSIS — N1831 Chronic kidney disease, stage 3a: Secondary | ICD-10-CM

## 2024-08-16 DIAGNOSIS — R911 Solitary pulmonary nodule: Secondary | ICD-10-CM | POA: Diagnosis not present

## 2024-08-16 DIAGNOSIS — K5010 Crohn's disease of large intestine without complications: Secondary | ICD-10-CM

## 2024-08-16 LAB — MAGNESIUM: Magnesium: 1.8 mg/dL (ref 1.7–2.4)

## 2024-08-16 LAB — PHOSPHORUS: Phosphorus: 2.8 mg/dL (ref 2.5–4.6)

## 2024-08-16 LAB — CBC
HCT: 27.8 % — ABNORMAL LOW (ref 39.0–52.0)
Hemoglobin: 9.4 g/dL — ABNORMAL LOW (ref 13.0–17.0)
MCH: 31.2 pg (ref 26.0–34.0)
MCHC: 33.8 g/dL (ref 30.0–36.0)
MCV: 92.4 fL (ref 80.0–100.0)
Platelets: 335 K/uL (ref 150–400)
RBC: 3.01 MIL/uL — ABNORMAL LOW (ref 4.22–5.81)
RDW: 14.5 % (ref 11.5–15.5)
WBC: 9.2 K/uL (ref 4.0–10.5)
nRBC: 0 % (ref 0.0–0.2)

## 2024-08-16 LAB — GLUCOSE, CAPILLARY
Glucose-Capillary: 108 mg/dL — ABNORMAL HIGH (ref 70–99)
Glucose-Capillary: 91 mg/dL (ref 70–99)

## 2024-08-16 LAB — COMPREHENSIVE METABOLIC PANEL WITH GFR
ALT: 40 U/L (ref 0–44)
AST: 30 U/L (ref 15–41)
Albumin: 3.1 g/dL — ABNORMAL LOW (ref 3.5–5.0)
Alkaline Phosphatase: 67 U/L (ref 38–126)
Anion gap: 9 (ref 5–15)
BUN: 11 mg/dL (ref 8–23)
CO2: 28 mmol/L (ref 22–32)
Calcium: 8.5 mg/dL — ABNORMAL LOW (ref 8.9–10.3)
Chloride: 99 mmol/L (ref 98–111)
Creatinine, Ser: 0.98 mg/dL (ref 0.61–1.24)
GFR, Estimated: >60 mL/min
Glucose, Bld: 144 mg/dL — ABNORMAL HIGH (ref 70–99)
Potassium: 3.3 mmol/L — ABNORMAL LOW (ref 3.5–5.1)
Sodium: 135 mmol/L (ref 135–145)
Total Bilirubin: 0.6 mg/dL (ref 0.0–1.2)
Total Protein: 5.4 g/dL — ABNORMAL LOW (ref 6.5–8.1)

## 2024-08-16 LAB — HEPARIN LEVEL (UNFRACTIONATED)
Heparin Unfractionated: 0.28 [IU]/mL — ABNORMAL LOW (ref 0.30–0.70)
Heparin Unfractionated: 0.43 [IU]/mL (ref 0.30–0.70)
Heparin Unfractionated: 0.5 [IU]/mL (ref 0.30–0.70)

## 2024-08-16 MED ORDER — POTASSIUM CHLORIDE CRYS ER 20 MEQ PO TBCR
40.0000 meq | EXTENDED_RELEASE_TABLET | Freq: Two times a day (BID) | ORAL | Status: AC
  Administered 2024-08-16 – 2024-08-17 (×2): 40 meq via ORAL
  Filled 2024-08-16 (×2): qty 2

## 2024-08-16 MED ORDER — MAGNESIUM SULFATE 2 GM/50ML IV SOLN
2.0000 g | Freq: Once | INTRAVENOUS | Status: AC
  Administered 2024-08-16: 2 g via INTRAVENOUS
  Filled 2024-08-16: qty 50

## 2024-08-16 NOTE — Plan of Care (Signed)
  Problem: Elimination: Goal: Will not experience complications related to urinary retention Outcome: Completed/Met   Problem: Pain Managment: Goal: General experience of comfort will improve and/or be controlled Outcome: Completed/Met

## 2024-08-16 NOTE — Progress Notes (Signed)
 PHARMACY - ANTICOAGULATION  Pharmacy Consult for heparin  Indication: antithrombin III  deficiency and Hx DVT  Allergies[1]  Patient Measurements: Height: 5' 9 (175.3 cm) Weight: 88.5 kg (195 lb 1.7 oz) IBW/kg (Calculated) : 70.7 HEPARIN  DW (KG): 87.3  Vital Signs: Temp: 98.8 F (37.1 C) (01/10 1956) Temp Source: Oral (01/10 1956) BP: 140/83 (01/10 1956) Pulse Rate: 98 (01/10 1956)  Labs: Recent Labs    08/14/24 0211 08/15/24 0537 08/15/24 1435 08/16/24 0003 08/16/24 0456 08/16/24 1005 08/16/24 1203 08/16/24 1857  HGB 10.5* 10.0*  --   --  9.4*  --   --   --   HCT 30.6* 29.4*  --   --  27.8*  --   --   --   PLT 332 318  --   --  335  --   --   --   HEPARINUNFRC 0.33  --    < > 0.28*  --   --  0.50 0.43  CREATININE  --   --   --   --   --  0.98  --   --    < > = values in this interval not displayed.    Estimated Creatinine Clearance: 81.6 mL/min (by C-G formula based on SCr of 0.98 mg/dL).   Assessment: 69 y.o. male with admitted with UGIB, h/o AT3 deficiency, Coumadin  on hold, for heparin .  1/10 PM: Heparin  level in the middle of goal range. Continue current rate.  Goal of Therapy:  Heparin  level 0.3-0.5 units/ml Monitor platelets by anticoagulation protocol: Yes   Plan:  Continue Heparin  AT 1000 units/hr Check CBC and HL daily  Larraine Brazier, PharmD Clinical Pharmacist 08/16/2024  8:02 PM **Pharmacist phone directory can now be found on [email or web address] ([email or web address]).  Listed under [facility].                 [1]  Allergies Allergen Reactions   Olmesartan      Hx of severe AKI requiring HD on ACE per renal note, hx of AKI and hypotension with olmesartan  07/2023   Mesalamine Rash    REACTION: Rash

## 2024-08-16 NOTE — Progress Notes (Signed)
 PHARMACY - ANTICOAGULATION  Pharmacy Consult for heparin  Indication: antithrombin III  deficiency and Hx DVT; Brief A/P: Heparin  level subtherapeutic Increase Heparin  rate  Allergies[1]  Patient Measurements: Height: 5' 9 (175.3 cm) Weight: 88.5 kg (195 lb 1.7 oz) IBW/kg (Calculated) : 70.7 HEPARIN  DW (KG): 87.3  Vital Signs: Temp: 98.7 F (37.1 C) (01/10 0012) Temp Source: Oral (01/09 1954) BP: 119/69 (01/10 0012) Pulse Rate: 86 (01/10 0012)  Labs: Recent Labs    08/13/24 0520 08/13/24 1106 08/14/24 0211 08/15/24 0537 08/15/24 1435 08/16/24 0003  HGB 10.2*  --  10.5* 10.0*  --   --   HCT 29.2*  --  30.6* 29.4*  --   --   PLT 307  --  332 318  --   --   HEPARINUNFRC 0.47   < > 0.33  --  <0.10* 0.28*   < > = values in this interval not displayed.    Estimated Creatinine Clearance: 87.9 mL/min (by C-G formula based on SCr of 0.91 mg/dL).   Assessment: 69 y.o. male with admitted with UGIB, h/o AT3 deficiency, Coumadin  on hold, for heparin   Goal of Therapy:  Heparin  level 0.3-0.5 units/ml Monitor platelets by anticoagulation protocol: Yes   Plan:  Increase Heparin  1050 units/hr Follow-up am labs.  Cathlyn Arrant, PharmD, BCPS                [1]  Allergies Allergen Reactions   Olmesartan      Hx of severe AKI requiring HD on ACE per renal note, hx of AKI and hypotension with olmesartan  07/2023   Mesalamine Rash    REACTION: Rash

## 2024-08-16 NOTE — Progress Notes (Signed)
 " PROGRESS NOTE    Rick Edwards  FMW:[id number] DOB: 05-01-1956 DOA: 08/07/2024 PCP: Joshua Debby CROME, MD   Brief Narrative:  Rick Edwards is a 69 year old male with extensive history of Antithrombin III  deficiency chronically on Coumadin , CKD, Crohn's disease, chronic anemia, hypercalcemic hyperkalemia HTN, ileostomy/takedown 2014, PVD,... Presented to ED with chief complaint of black-colored stool and shortness of breath and was found to have moderate size right pleural effusion and possible GI bleed. GI consulted. Status post EGD and due to oozing ulcer which was stopped. Cytology from pleural fluid shows adenocarcinoma. Oncology and pulmonology involved. Details below.   Assessment and Plan:  Acute blood loss anemia secondary to duodenal ulcer/upper GI bleed, POA: Patient's baseline hemoglobin is around 14 which dropped to 10.3 however has remained stable between 10-11 for last 5 days. Hgb/Hct Trend:  Recent Labs  Lab 08/10/24 1648 08/11/24 0224 08/12/24 0509 08/13/24 0520 08/14/24 0211 08/15/24 0537 08/16/24 0456  HGB 10.8* 10.2* 10.1* 10.2* 10.5* 10.0* 9.4*  HCT 32.1* 30.1* 29.4* 29.2* 30.6* 29.4* 27.8*  MCV 92.8 92.0 92.2 89.6 90.3 92.7 92.4  -FOBT ordered but is still pending.  Seen by GI, status post EGD 08/12/2023, was found to have oozing duodenal ulcer which was stopped by hemostatic spray.  GI recommended IV PPI to every 6 hours.  No further episodes of bleeding, hemoglobin is stable despite of starting on heparin .   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  -CEA was 119.0 GI plans to repeat EGD if no conclusion with lung biopsy.  [facility] will need to be contacted back if repeat EGD is needed.   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  Underwent thoracentesis by IR 08/09/2024 with a yield of 100 cc fluid.  Fluid analysis consistent with lymphocyte predominant  exudative fluid. cytology came back positive for adenocarcinoma unfortunately.   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  Oncology consulted, they are also suspecting possible pulmonary cancer primary due to left upper lobe nodule, pulmonary consulted, patient underwent video bronchoscopy with endobronchial ultrasound and electromagnetic navigation procedure 08/14/2024.   -Cytology/pathology pending and still in Process.   -In the meantime, CEA came back significantly elevated at 119.0.  Defer further management to oncology.   Antithrombin 3 deficiency/supratherapeutic INR: Chronically anticoagulated on Coumadin , INR 2.7 upon arrival.  Received vitamin K  for reversal.  Currently remain on Heparin  gtt.  GI recommends holding Coumadin  for at least 1 week/until 08/18/2024.   Right renal mass: Multiple bilateral renal cysts, 1 including 2.1 cm right kidney possible RCC On a CT scan-EDP Dr. Elnor has discussed the findings with on-call urologist Does not recommend any inpatient further workup -to follow-up as an outpatient.   Type 2 Diabetes Mellitus: Last hemoglobin A1c 6.2, currently not on any medications. CBG Trend:  Recent Labs  Lab 08/12/24 1121 08/12/24 1548 08/14/24 0730 08/14/24 1137 08/15/24 0816 08/16/24 0407 08/16/24 0744  GLUCAP 102* 110* 105* 111* 127* 108* 91   Hypokalemia: K+ is now 3.3. Replete w/ po KCL 40 mE BID x2. CTM and Replete as Necessary. Repeat CMP in the AM   Stage III CKDa: At Baseline. BUN/Cr Trend: Recent Labs  Lab 07/18/24 1703 08/07/24 1352 08/08/24 1037 08/09/24 0830 08/10/24 0627 08/16/24 1005  BUN 13 25* 36* 23 16 11   CREATININE 1.00 0.89 0.95 0.92 0.91 0.98  -Avoid Nephrotoxic Medications, Contrast Dyes, Hypotension  and Dehydration to Ensure Adequate Renal Perfusion and will need to Renally Adjust Meds. CTM & Trend Renal Function carefully & repeat CMP in the AM   History of chron's  disease: Stable.   PVD: Continue statins.   Dyslipidemia: Continue statin.   Hypoalbuminemia: Patient's Albumin  Lvl went from 3.8 -> 3.1. CTM & Trend & repeat CMP in the AM   DVT prophylaxis: SCDs Start: 08/08/24 1636 Place TED hose Start: 08/08/24 1636    Code Status: Full Code Family Communication: D/w Family present @ bedside  Disposition Plan:  Level of care: Telemetry Status is: Inpatient Remains inpatient appropriate because: Needs further clinical improvement and clearance by the Specialists. Awaiting Cytology and Pathology   Consultants:  Gastroenterology Pulmonary  Procedures:  As delineated as above   Antimicrobials:  Anti-infectives (From admission, onward)    None       Subjective: Seen and examined at bedside and was doing okay.  Still awaiting cytology results.  No nausea or vomiting.  Denies any concerns or complaint at this time.  Objective: Vitals:   08/16/24 0747 08/16/24 1136 08/16/24 1547 08/16/24 1956  BP: 126/80 116/61 137/76 (!) 140/83  Pulse: 94 80 82 98  Resp: 18 19 18 19   Temp: 98 F (36.7 C) 98 F (36.7 C) 98.6 F (37 C) 98.8 F (37.1 C)  TempSrc:   Oral Oral  SpO2: 90% 93% 92% 92%  Weight:      Height:        Intake/Output Summary (Last 24 hours) at 08/16/2024 2009 Last data filed at 08/16/2024 1808 Gross per 24 hour  Intake 622.73 ml  Output 500 ml  Net 122.73 ml   Filed Weights   08/11/24 0907 08/12/24 0533 08/14/24 0500  Weight: 87.3 kg 88.1 kg 88.5 kg   Examination: Physical Exam:  Constitutional: WN/WD overweight chronically ill-appearing African-American male in no acute distress Respiratory: Diminished to auscultation bilaterally, no wheezing, rales, rhonchi or crackles. Normal respiratory effort and patient is not tachypenic. No accessory muscle use.  Unlabored breathing Cardiovascular: RRR, no murmurs / rubs / gallops. S1 and S2 auscultated. No extremity edema. 2+ pedal pulses. No carotid bruits.  Abdomen: Soft,  non-tender, Distended 2/2 body habitus. Bowel sounds positive.  GU: Deferred. Musculoskeletal: No clubbing / cyanosis of digits/nails. No joint deformity upper and lower extremities.  Skin: No rashes, lesions, ulcers on a limited skin evaluation. No induration; Warm and dry.  Neurologic: CN 2-12 grossly intact with no focal deficits. Romberg sign cerebellar reflexes not assessed.  Psychiatric: Normal judgment and insight. Alert and oriented x 3. Normal mood and appropriate affect.   Data Reviewed: I have personally reviewed following labs and imaging studies  CBC: Recent Labs  Lab 08/12/24 0509 08/13/24 0520 08/14/24 0211 08/15/24 0537 08/16/24 0456  WBC 8.0 6.6 7.0 6.9 9.2  HGB 10.1* 10.2* 10.5* 10.0* 9.4*  HCT 29.4* 29.2* 30.6* 29.4* 27.8*  MCV 92.2 89.6 90.3 92.7 92.4  PLT 278 307 332 318 335   Basic Metabolic Panel: Recent Labs  Lab 08/10/24 0627 08/16/24 1005  NA 140 135  K 4.0 3.3*  CL 105 99  CO2 26 28  GLUCOSE 107* 144*  BUN 16 11  CREATININE 0.91 0.98  CALCIUM  8.6* 8.5*  MG  --  1.8  PHOS  --  2.8   GFR: Estimated Creatinine Clearance: 81.6 mL/min (by C-G formula based on SCr of 0.98 mg/dL). Liver Function Tests: Recent Labs  Lab 08/16/24 1005  AST 30  ALT  40  ALKPHOS 67  BILITOT 0.6  PROT 5.4*  ALBUMIN  3.1*   No results for input(s): LIPASE, AMYLASE in the last 168 hours. No results for input(s): AMMONIA in the last 168 hours. Coagulation Profile: Recent Labs  Lab 08/10/24 0627 08/11/24 0224  INR 1.6* 1.4*   Cardiac Enzymes: No results for input(s): CKTOTAL, CKMB, CKMBINDEX, TROPONINI in the last 168 hours. BNP (last 3 results) Recent Labs    08/08/24 1227  PROBNP <50.0   HbA1C: No results for input(s): HGBA1C in the last 72 hours. CBG: Recent Labs  Lab 08/14/24 0730 08/14/24 1137 08/15/24 0816 08/16/24 0407 08/16/24 0744  GLUCAP 105* 111* 127* 108* 91   Lipid Profile: No results for input(s): CHOL, HDL,  LDLCALC, TRIG, CHOLHDL, LDLDIRECT in the last 72 hours. Thyroid  Function Tests: No results for input(s): TSH, T4TOTAL, FREET4, T3FREE, THYROIDAB in the last 72 hours. Anemia Panel: No results for input(s): VITAMINB12, FOLATE, FERRITIN, TIBC, IRON, RETICCTPCT in the last 72 hours. Sepsis Labs: No results for input(s): PROCALCITON, LATICACIDVEN in the last 168 hours.  No results found for this or any previous visit (from the past 240 hours).   Radiology Studies: No results found.  Scheduled Meds:  allopurinol   300 mg Oral Daily   atorvastatin   40 mg Oral Daily   doxazosin   4 mg Oral QHS   melatonin  5 mg Oral QHS   pantoprazole  (PROTONIX ) IV  40 mg Intravenous Q12H   potassium chloride   40 mEq Oral BID   senna-docusate  1 tablet Oral BID   sodium chloride  flush  3 mL Intravenous Q12H   sodium chloride  flush  3 mL Intravenous Q12H   spironolactone   25 mg Oral Daily   Continuous Infusions:  heparin  1,000 Units/hr (08/16/24 1242)   magnesium  sulfate bolus IVPB      LOS: 7 days   Alejandro Marker, DO [facility] Available via Epic secure chat 7am-7pm After these hours, please refer to coverage provider listed on [email or web address] 08/16/2024, 8:09 PM  "

## 2024-08-16 NOTE — Progress Notes (Signed)
 PHARMACY - ANTICOAGULATION  Pharmacy Consult for heparin  Indication: antithrombin III  deficiency and Hx DVT  Allergies[1]  Patient Measurements: Height: 5' 9 (175.3 cm) Weight: 88.5 kg (195 lb 1.7 oz) IBW/kg (Calculated) : 70.7 HEPARIN  DW (KG): 87.3  Vital Signs: Temp: 98 F (36.7 C) (01/10 1136) Temp Source: Oral (01/10 0406) BP: 116/61 (01/10 1136) Pulse Rate: 80 (01/10 1136)  Labs: Recent Labs    08/14/24 0211 08/15/24 0537 08/15/24 1435 08/16/24 0003 08/16/24 0456 08/16/24 1005 08/16/24 1203  HGB 10.5* 10.0*  --   --  9.4*  --   --   HCT 30.6* 29.4*  --   --  27.8*  --   --   PLT 332 318  --   --  335  --   --   HEPARINUNFRC 0.33  --  <0.10* 0.28*  --   --  0.50  CREATININE  --   --   --   --   --  0.98  --     Estimated Creatinine Clearance: 81.6 mL/min (by C-G formula based on SCr of 0.98 mg/dL).   Assessment: 69 y.o. male with admitted with UGIB, h/o AT3 deficiency, Coumadin  on hold, for heparin .  Patient at higher end of goal with dose increase. Per RN, no issues with the infusion or signs/sx bleeding. Will decrease slightly to avoid supratherapeutic level.   Goal of Therapy:  Heparin  level 0.3-0.5 units/ml Monitor platelets by anticoagulation protocol: Yes   Plan:  Decrease Heparin  to 1000 units/hr Follow up level in 6h Check CBC and HL daily  Keasia Dubose, PharmD PGY1 Clinical Pharmacist [facility]  08/16/2024 12:29 PM              [1]  Allergies Allergen Reactions   Olmesartan      Hx of severe AKI requiring HD on ACE per renal note, hx of AKI and hypotension with olmesartan  07/2023   Mesalamine Rash    REACTION: Rash

## 2024-08-16 NOTE — Plan of Care (Signed)
  Problem: Education: Goal: Knowledge of General Education information will improve Description: Including pain rating scale, medication(s)/side effects and non-pharmacologic comfort measures Outcome: Progressing   Problem: Health Behavior/Discharge Planning: Goal: Ability to manage health-related needs will improve Outcome: Progressing   Problem: Clinical Measurements: Goal: Will remain free from infection Outcome: Progressing Goal: Diagnostic test results will improve Outcome: Progressing Goal: Cardiovascular complication will be avoided Outcome: Progressing

## 2024-08-17 DIAGNOSIS — K922 Gastrointestinal hemorrhage, unspecified: Secondary | ICD-10-CM | POA: Diagnosis not present

## 2024-08-17 DIAGNOSIS — N2889 Other specified disorders of kidney and ureter: Secondary | ICD-10-CM

## 2024-08-17 DIAGNOSIS — E785 Hyperlipidemia, unspecified: Secondary | ICD-10-CM | POA: Diagnosis not present

## 2024-08-17 DIAGNOSIS — Z7901 Long term (current) use of anticoagulants: Secondary | ICD-10-CM | POA: Diagnosis not present

## 2024-08-17 DIAGNOSIS — I743 Embolism and thrombosis of arteries of the lower extremities: Secondary | ICD-10-CM

## 2024-08-17 DIAGNOSIS — J90 Pleural effusion, not elsewhere classified: Secondary | ICD-10-CM | POA: Diagnosis not present

## 2024-08-17 DIAGNOSIS — D6859 Other primary thrombophilia: Secondary | ICD-10-CM | POA: Diagnosis not present

## 2024-08-17 LAB — CBC WITH DIFFERENTIAL/PLATELET
Abs Immature Granulocytes: 0.03 K/uL (ref 0.00–0.07)
Basophils Absolute: 0 K/uL (ref 0.0–0.1)
Basophils Relative: 0 %
Eosinophils Absolute: 0.1 K/uL (ref 0.0–0.5)
Eosinophils Relative: 1 %
HCT: 29.9 % — ABNORMAL LOW (ref 39.0–52.0)
Hemoglobin: 10.2 g/dL — ABNORMAL LOW (ref 13.0–17.0)
Immature Granulocytes: 0 %
Lymphocytes Relative: 21 %
Lymphs Abs: 1.6 K/uL (ref 0.7–4.0)
MCH: 31.7 pg (ref 26.0–34.0)
MCHC: 34.1 g/dL (ref 30.0–36.0)
MCV: 92.9 fL (ref 80.0–100.0)
Monocytes Absolute: 0.9 K/uL (ref 0.1–1.0)
Monocytes Relative: 12 %
Neutro Abs: 4.9 K/uL (ref 1.7–7.7)
Neutrophils Relative %: 66 %
Platelets: 397 K/uL (ref 150–400)
RBC: 3.22 MIL/uL — ABNORMAL LOW (ref 4.22–5.81)
RDW: 14.6 % (ref 11.5–15.5)
WBC: 7.5 K/uL (ref 4.0–10.5)
nRBC: 0 % (ref 0.0–0.2)

## 2024-08-17 LAB — COMPREHENSIVE METABOLIC PANEL WITH GFR
ALT: 34 U/L (ref 0–44)
AST: 18 U/L (ref 15–41)
Albumin: 3.2 g/dL — ABNORMAL LOW (ref 3.5–5.0)
Alkaline Phosphatase: 73 U/L (ref 38–126)
Anion gap: 9 (ref 5–15)
BUN: 7 mg/dL — ABNORMAL LOW (ref 8–23)
CO2: 28 mmol/L (ref 22–32)
Calcium: 8.4 mg/dL — ABNORMAL LOW (ref 8.9–10.3)
Chloride: 101 mmol/L (ref 98–111)
Creatinine, Ser: 0.85 mg/dL (ref 0.61–1.24)
GFR, Estimated: >60 mL/min
Glucose, Bld: 90 mg/dL (ref 70–99)
Potassium: 3.8 mmol/L (ref 3.5–5.1)
Sodium: 138 mmol/L (ref 135–145)
Total Bilirubin: 0.7 mg/dL (ref 0.0–1.2)
Total Protein: 5.6 g/dL — ABNORMAL LOW (ref 6.5–8.1)

## 2024-08-17 LAB — GLUCOSE, CAPILLARY: Glucose-Capillary: 125 mg/dL — ABNORMAL HIGH (ref 70–99)

## 2024-08-17 LAB — HEPARIN LEVEL (UNFRACTIONATED): Heparin Unfractionated: 0.44 [IU]/mL (ref 0.30–0.70)

## 2024-08-17 LAB — PHOSPHORUS: Phosphorus: 3.6 mg/dL (ref 2.5–4.6)

## 2024-08-17 LAB — MAGNESIUM: Magnesium: 2 mg/dL (ref 1.7–2.4)

## 2024-08-17 NOTE — Plan of Care (Signed)
   Problem: Clinical Measurements: Goal: Diagnostic test results will improve Outcome: Progressing   Problem: Clinical Measurements: Goal: Respiratory complications will improve Outcome: Progressing   Problem: Clinical Measurements: Goal: Cardiovascular complication will be avoided Outcome: Progressing

## 2024-08-17 NOTE — Progress Notes (Signed)
 PHARMACY - ANTICOAGULATION  Pharmacy Consult for heparin  Indication: antithrombin III  deficiency and Hx DVT  Allergies[1]  Patient Measurements: Height: 5' 9 (175.3 cm) Weight: 88 kg (194 lb 0.1 oz) IBW/kg (Calculated) : 70.7 HEPARIN  DW (KG): 87.3  Vital Signs: Temp: 98.3 F (36.8 C) (01/11 0454) Temp Source: Oral (01/11 0454) BP: 125/77 (01/11 0454) Pulse Rate: 90 (01/11 0454)  Labs: Recent Labs    08/15/24 0537 08/15/24 1435 08/16/24 0456 08/16/24 1005 08/16/24 1203 08/16/24 1857 08/17/24 0539  HGB 10.0*  --  9.4*  --   --   --  10.2*  HCT 29.4*  --  27.8*  --   --   --  29.9*  PLT 318  --  335  --   --   --  397  HEPARINUNFRC  --    < >  --   --  0.50 0.43 0.44  CREATININE  --   --   --  0.98  --   --  0.85   < > = values in this interval not displayed.    Estimated Creatinine Clearance: 93.8 mL/min (by C-G formula based on SCr of 0.85 mg/dL).   Assessment: 69 y.o. male with admitted with UGIB, h/o AT3 deficiency, Coumadin  on hold, for heparin .  Heparin  level therapeutic at 0.44. No signs/sx bleeding or interruptions to the infusion noted. Will continue current rate.   Goal of Therapy:  Heparin  level 0.3-0.5 units/ml Monitor platelets by anticoagulation protocol: Yes   Plan:  Continue Heparin  AT 1000 units/hr Check CBC and HL daily  Diogenes Whirley, PharmD PGY1 Clinical Pharmacist [facility]  08/17/2024 7:13 AM                  [1]  Allergies Allergen Reactions   Olmesartan      Hx of severe AKI requiring HD on ACE per renal note, hx of AKI and hypotension with olmesartan  07/2023   Mesalamine Rash    REACTION: Rash

## 2024-08-17 NOTE — Progress Notes (Signed)
 " PROGRESS NOTE    Rick Edwards  FMW:[id number] DOB: 12-18-1955 DOA: 08/07/2024 PCP: Joshua Debby CROME, MD   Brief Narrative:  Rick Edwards is a 69 year old male with extensive history of Antithrombin III  deficiency chronically on Coumadin , CKD, Crohn's disease, chronic anemia, hypercalcemic hyperkalemia HTN, ileostomy/takedown 2014, PVD,... Presented to ED with chief complaint of black-colored stool and shortness of breath and was found to have moderate size right pleural effusion and possible GI bleed. GI consulted. Status post EGD and due to oozing ulcer which was stopped. Cytology from pleural fluid shows adenocarcinoma. Oncology and Pulmonology involved. Details below.   Assessment and Plan:  Acute blood loss anemia secondary to duodenal ulcer/upper GI bleed, POA: Patient's baseline hemoglobin is around 14 which dropped to 10.3 however has remained stable between 10-11 for last 5 days. Hgb/Hct Trend now fairly stable:  Recent Labs  Lab 08/11/24 0224 08/12/24 0509 08/13/24 0520 08/14/24 0211 08/15/24 0537 08/16/24 0456 08/17/24 0539  HGB 10.2* 10.1* 10.2* 10.5* 10.0* 9.4* 10.2*  HCT 30.1* 29.4* 29.2* 30.6* 29.4* 27.8* 29.9*  MCV 92.0 92.2 89.6 90.3 92.7 92.4 92.9  -FOBT ordered but is still pending.  Seen by GI, status post EGD 08/12/2023, was found to have oozing duodenal ulcer which was stopped by hemostatic spray.  GI recommended IV PPI to every 6 hours.  No further episodes of bleeding, hemoglobin is stable despite of starting on heparin ; CTM for S/Sx of Bleeding while  -Pleural fluid analysis/cytology shows adenocarcinoma, GI suspects possible duodenal cancer.  -CEA was 119.0 GI plans to repeat EGD if no conclusion with lung biopsy.  [facility] will need to be contacted back if repeat EGD is needed.   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  Underwent thoracentesis by IR 08/09/2024 with a yield of 100 cc fluid.  Fluid  analysis consistent with lymphocyte predominant exudative fluid. cytology came back positive for adenocarcinoma unfortunately.   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  Oncology consulted, they are also suspecting possible pulmonary cancer primary due to left upper lobe nodule, pulmonary consulted, patient underwent video bronchoscopy with endobronchial ultrasound and electromagnetic navigation procedure 08/14/2024.   -Cytology/Pathology pending and still in Process.   -In the meantime, CEA came back significantly elevated at 119.0.  Defer further management to oncology.   Antithrombin 3 Deficiency/supratherapeutic INR:  Hx of DVT -Chronically anticoagulated on Coumadin , INR 2.7 upon arrival.  Received vitamin K  for reversal.  Now Currently remain on Heparin  gtt.  GI recommends holding Coumadin  for at least 1 week/until 08/18/2024 and likely will resume in the AM and check PT-INR in the AM .   Right Renal Mass: Multiple bilateral renal cysts, 1 including 2.1 cm right kidney possible RCC On a CT scan-EDP Dr. Elnor has discussed the findings with on-call urologist Does not recommend any inpatient further workup -to follow-up as an outpatient.   Type 2 Diabetes Mellitus: Last hemoglobin A1c 6.2, currently not on any medications. CBG Trend:  Recent Labs  Lab 08/12/24 1548 08/14/24 0730 08/14/24 1137 08/15/24 0816 08/16/24 0407 08/16/24 0744 08/17/24 0746  GLUCAP 110* 105* 111* 127* 108* 91 125*   Hypokalemia: K+ is now improved and went from 3.3 -> 3.8.  CTM and Replete as Necessary. Repeat CMP in the AM   Stage III CKDa: At Baseline. BUN/Cr Trend: Recent Labs  Lab 08/07/24 1352 08/08/24 1037 08/09/24 0830 08/10/24 0627 08/16/24  1005 08/17/24 0539  BUN 25* 36* 23 16 11  7*  CREATININE 0.89 0.95 0.92 0.91 0.98 0.85  -Avoid Nephrotoxic Medications, Contrast Dyes, Hypotension and Dehydration to Ensure Adequate Renal Perfusion  and will need to Renally Adjust Meds. CTM & Trend Renal Function carefully & repeat CMP in the AM   History of Crohn's disease: Stable.   PVD: Continue Atorvastatin  40 mg po Daily.   Dyslipidemia: Continue Atorvastatin  40 mg po Daily.   Hypoalbuminemia: Patient's Albumin  Lvl went from 3.8 -> 3.1 -> 3.2. CTM & Trend & repeat CMP in the AM  Overweight: Complicates overall prognosis and care. Estimated body mass index is 28.65 kg/m as calculated from the following:   Height as of this encounter: 5' 9 (1.753 m).   Weight as of this encounter: 88 kg. Weight Loss and Dietary Counseling given  DVT prophylaxis: SCDs Start: 08/08/24 1636 Place TED hose Start: 08/08/24 1636    Code Status: Full Code Family Communication: D/w Family present @ bedside   Disposition Plan:  Level of care: Telemetry Status is: Inpatient Remains inpatient appropriate because: Needs further clinical improvement and clearance by the Specialists. Awaiting Cytology and Pathology    Consultants:  Gastroenterology Pulmonary Medical Oncology/Hematology   Procedures:  As delineated as above  Antimicrobials:  Anti-infectives (From admission, onward)    None       Subjective: Seen and examined at bedside resting and doing fairly well.  No nausea or vomiting. Denies any Abdominal Pain.  No other concerns or complaints this time  Objective: Vitals:   08/17/24 0500 08/17/24 0744 08/17/24 1113 08/17/24 1557  BP:  135/74 139/82 (!) 146/90  Pulse:  91 92 95  Resp:  18 18 18   Temp:  98.2 F (36.8 C) 98 F (36.7 C) 98.1 F (36.7 C)  TempSrc:   Oral Oral  SpO2:  92% 92% 95%  Weight: 88 kg     Height:        Intake/Output Summary (Last 24 hours) at 08/17/2024 1903 Last data filed at 08/17/2024 1738 Gross per 24 hour  Intake 656.45 ml  Output 1300 ml  Net -643.55 ml   Filed Weights   08/12/24 0533 08/14/24 0500 08/17/24 0500  Weight: 88.1 kg 88.5 kg 88 kg   Examination: Physical  Exam:  Constitutional: WN/WD overweight elderly chronically ill-appearing African-American male no acute distress Respiratory: Diminished to auscultation bilaterally, no wheezing, rales, rhonchi or crackles. Normal respiratory effort and patient is not tachypenic. No accessory muscle use.  Unlabored breathing Cardiovascular: RRR, no murmurs / rubs / gallops. S1 and S2 auscultated. No extremity edema. Abdomen: Soft, non-tender, mildly distended secondary body habitus.  Bowel sounds positive.  GU: Deferred. Musculoskeletal: No clubbing / cyanosis of digits/nails. No joint deformity upper and lower extremities.  Skin: No rashes, lesions, ulcers on a limited skin evaluation. No induration; Warm and dry.  Neurologic: CN 2-12 grossly intact with no focal deficits. Romberg sign and cerebellar reflexes not assessed.  Psychiatric: Normal judgment and insight. Alert and oriented x 3. Normal mood and appropriate affect.   Data Reviewed: I have personally reviewed following labs and imaging studies  CBC: Recent Labs  Lab 08/13/24 0520 08/14/24 0211 08/15/24 0537 08/16/24 0456 08/17/24 0539  WBC 6.6 7.0 6.9 9.2 7.5  NEUTROABS  --   --   --   --  4.9  HGB 10.2* 10.5* 10.0* 9.4* 10.2*  HCT 29.2* 30.6* 29.4* 27.8* 29.9*  MCV 89.6 90.3 92.7 92.4 92.9  PLT 307  332 318 335 397   Basic Metabolic Panel: Recent Labs  Lab 08/16/24 1005 08/17/24 0539  NA 135 138  K 3.3* 3.8  CL 99 101  CO2 28 28  GLUCOSE 144* 90  BUN 11 7*  CREATININE 0.98 0.85  CALCIUM  8.5* 8.4*  MG 1.8 2.0  PHOS 2.8 3.6   GFR: Estimated Creatinine Clearance: 93.8 mL/min (by C-G formula based on SCr of 0.85 mg/dL). Liver Function Tests: Recent Labs  Lab 08/16/24 1005 08/17/24 0539  AST 30 18  ALT 40 34  ALKPHOS 67 73  BILITOT 0.6 0.7  PROT 5.4* 5.6*  ALBUMIN  3.1* 3.2*   No results for input(s): LIPASE, AMYLASE in the last 168 hours. No results for input(s): AMMONIA in the last 168 hours. Coagulation  Profile: Recent Labs  Lab 08/11/24 0224  INR 1.4*   Cardiac Enzymes: No results for input(s): CKTOTAL, CKMB, CKMBINDEX, TROPONINI in the last 168 hours. BNP (last 3 results) Recent Labs    08/08/24 1227  PROBNP <50.0   HbA1C: No results for input(s): HGBA1C in the last 72 hours. CBG: Recent Labs  Lab 08/14/24 1137 08/15/24 0816 08/16/24 0407 08/16/24 0744 08/17/24 0746  GLUCAP 111* 127* 108* 91 125*   Lipid Profile: No results for input(s): CHOL, HDL, LDLCALC, TRIG, CHOLHDL, LDLDIRECT in the last 72 hours. Thyroid  Function Tests: No results for input(s): TSH, T4TOTAL, FREET4, T3FREE, THYROIDAB in the last 72 hours. Anemia Panel: No results for input(s): VITAMINB12, FOLATE, FERRITIN, TIBC, IRON, RETICCTPCT in the last 72 hours. Sepsis Labs: No results for input(s): PROCALCITON, LATICACIDVEN in the last 168 hours.  No results found for this or any previous visit (from the past 240 hours).   Radiology Studies: No results found.  Scheduled Meds:  allopurinol   300 mg Oral Daily   atorvastatin   40 mg Oral Daily   doxazosin   4 mg Oral QHS   melatonin  5 mg Oral QHS   pantoprazole  (PROTONIX ) IV  40 mg Intravenous Q12H   senna-docusate  1 tablet Oral BID   sodium chloride  flush  3 mL Intravenous Q12H   sodium chloride  flush  3 mL Intravenous Q12H   spironolactone   25 mg Oral Daily   Continuous Infusions:  heparin  1,000 Units/hr (08/17/24 1738)    LOS: 8 days   Alejandro Marker, DO [facility] Available via Epic secure chat 7am-7pm After these hours, please refer to coverage provider listed on [email or web address] 08/17/2024, 7:03 PM  "

## 2024-08-17 NOTE — Plan of Care (Signed)
   Problem: Education: Goal: Knowledge of General Education information will improve Description: Including pain rating scale, medication(s)/side effects and non-pharmacologic comfort measures Outcome: Progressing   Problem: Clinical Measurements: Goal: Ability to maintain clinical measurements within normal limits will improve Outcome: Progressing Goal: Diagnostic test results will improve Outcome: Progressing Goal: Cardiovascular complication will be avoided Outcome: Progressing   Problem: Activity: Goal: Risk for activity intolerance will decrease Outcome: Progressing

## 2024-08-18 ENCOUNTER — Ambulatory Visit (HOSPITAL_COMMUNITY)

## 2024-08-18 ENCOUNTER — Inpatient Hospital Stay (HOSPITAL_COMMUNITY)

## 2024-08-18 DIAGNOSIS — J90 Pleural effusion, not elsewhere classified: Secondary | ICD-10-CM | POA: Diagnosis not present

## 2024-08-18 DIAGNOSIS — Z7901 Long term (current) use of anticoagulants: Secondary | ICD-10-CM | POA: Diagnosis not present

## 2024-08-18 DIAGNOSIS — D6859 Other primary thrombophilia: Secondary | ICD-10-CM | POA: Diagnosis not present

## 2024-08-18 DIAGNOSIS — C801 Malignant (primary) neoplasm, unspecified: Secondary | ICD-10-CM

## 2024-08-18 DIAGNOSIS — I743 Embolism and thrombosis of arteries of the lower extremities: Secondary | ICD-10-CM | POA: Diagnosis not present

## 2024-08-18 DIAGNOSIS — E785 Hyperlipidemia, unspecified: Secondary | ICD-10-CM | POA: Diagnosis not present

## 2024-08-18 DIAGNOSIS — N2889 Other specified disorders of kidney and ureter: Secondary | ICD-10-CM | POA: Diagnosis not present

## 2024-08-18 DIAGNOSIS — K922 Gastrointestinal hemorrhage, unspecified: Secondary | ICD-10-CM | POA: Diagnosis not present

## 2024-08-18 LAB — CBC WITH DIFFERENTIAL/PLATELET
Abs Immature Granulocytes: 0.05 K/uL (ref 0.00–0.07)
Basophils Absolute: 0 K/uL (ref 0.0–0.1)
Basophils Relative: 1 %
Eosinophils Absolute: 0.2 K/uL (ref 0.0–0.5)
Eosinophils Relative: 2 %
HCT: 32.6 % — ABNORMAL LOW (ref 39.0–52.0)
Hemoglobin: 10.7 g/dL — ABNORMAL LOW (ref 13.0–17.0)
Immature Granulocytes: 1 %
Lymphocytes Relative: 22 %
Lymphs Abs: 1.5 K/uL (ref 0.7–4.0)
MCH: 30.7 pg (ref 26.0–34.0)
MCHC: 32.8 g/dL (ref 30.0–36.0)
MCV: 93.7 fL (ref 80.0–100.0)
Monocytes Absolute: 0.8 K/uL (ref 0.1–1.0)
Monocytes Relative: 11 %
Neutro Abs: 4.6 K/uL (ref 1.7–7.7)
Neutrophils Relative %: 63 %
Platelets: 378 K/uL (ref 150–400)
RBC: 3.48 MIL/uL — ABNORMAL LOW (ref 4.22–5.81)
RDW: 14.5 % (ref 11.5–15.5)
WBC: 7.1 K/uL (ref 4.0–10.5)
nRBC: 0 % (ref 0.0–0.2)

## 2024-08-18 LAB — COMPREHENSIVE METABOLIC PANEL WITH GFR
ALT: 27 U/L (ref 0–44)
AST: 13 U/L — ABNORMAL LOW (ref 15–41)
Albumin: 3.1 g/dL — ABNORMAL LOW (ref 3.5–5.0)
Alkaline Phosphatase: 72 U/L (ref 38–126)
Anion gap: 9 (ref 5–15)
BUN: <5 mg/dL — ABNORMAL LOW (ref 8–23)
CO2: 28 mmol/L (ref 22–32)
Calcium: 8.7 mg/dL — ABNORMAL LOW (ref 8.9–10.3)
Chloride: 100 mmol/L (ref 98–111)
Creatinine, Ser: 0.84 mg/dL (ref 0.61–1.24)
GFR, Estimated: >60 mL/min
Glucose, Bld: 97 mg/dL (ref 70–99)
Potassium: 4.2 mmol/L (ref 3.5–5.1)
Sodium: 136 mmol/L (ref 135–145)
Total Bilirubin: 0.7 mg/dL (ref 0.0–1.2)
Total Protein: 5.5 g/dL — ABNORMAL LOW (ref 6.5–8.1)

## 2024-08-18 LAB — HEPARIN LEVEL (UNFRACTIONATED): Heparin Unfractionated: 0.27 [IU]/mL — ABNORMAL LOW (ref 0.30–0.70)

## 2024-08-18 LAB — CYTOLOGY - NON PAP

## 2024-08-18 LAB — PHOSPHORUS: Phosphorus: 3.8 mg/dL (ref 2.5–4.6)

## 2024-08-18 LAB — MAGNESIUM: Magnesium: 1.7 mg/dL (ref 1.7–2.4)

## 2024-08-18 MED ORDER — WARFARIN SODIUM 5 MG PO TABS
7.5000 mg | ORAL_TABLET | Freq: Once | ORAL | Status: AC
  Administered 2024-08-18: 7.5 mg via ORAL
  Filled 2024-08-18: qty 1

## 2024-08-18 MED ORDER — MAGNESIUM SULFATE 2 GM/50ML IV SOLN
2.0000 g | Freq: Once | INTRAVENOUS | Status: AC
  Administered 2024-08-18: 2 g via INTRAVENOUS
  Filled 2024-08-18: qty 50

## 2024-08-18 MED ORDER — WARFARIN - PHARMACIST DOSING INPATIENT: Freq: Every day | Status: DC

## 2024-08-18 NOTE — Plan of Care (Signed)
   Problem: Education: Goal: Knowledge of General Education information will improve Description: Including pain rating scale, medication(s)/side effects and non-pharmacologic comfort measures Outcome: Progressing   Problem: Clinical Measurements: Goal: Will remain free from infection Outcome: Progressing Goal: Diagnostic test results will improve Outcome: Progressing

## 2024-08-18 NOTE — Progress Notes (Signed)
 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 Presented to ED with chief complaint of black-colored stool and shortness of breath and was found to have moderate size right pleural effusion and possible GI bleed. GI consulted. Status post EGD and due to oozing ulcer which was stopped. Cytology from pleural fluid shows adenocarcinoma and biopsy also confirmed adenocarcinoma. Oncology and Pulmonology involved. Details below.  The oncology team feels that he has a metastatic gastrointestinal malignancy but a lung primary could also be possible.  They recommend no further workup at this time and diet is being advanced and given that he is 7 days out from his acute blood loss anemia with fairly stable hemoglobin we will now resume his Coumadin  and he is to get a 7.5 mg dose once this evening.  Assessment and Plan:  Acute Blood Loss Anemia 2/2 to duodenal ulcer/upper GI bleed, POA: Patient's baseline hemoglobin is around 14 which dropped to 10.3 however has remained stable between 10-11 for last 5 days. Hgb/Hct Trend now fairly stable:  Recent Labs  Lab 08/12/24 0509 08/13/24 0520 08/14/24 0211 08/15/24 0537 08/16/24 0456 08/17/24 0539 08/18/24 0526  HGB 10.1* 10.2* 10.5* 10.0* 9.4* 10.2* 10.7*  HCT 29.4* 29.2* 30.6* 29.4* 27.8* 29.9* 32.6*  MCV 92.2 89.6 90.3 92.7 92.4 92.9 93.7  -FOBT ordered but is still pending.  Seen by GI, status post EGD 08/12/2023, was found to have oozing duodenal ulcer which was stopped by hemostatic spray.  GI recommended IV PPI to every 6 hours.  No further episodes of bleeding, hemoglobin is stable despite of starting on heparin ; CTM for S/Sx of Bleeding while  -Pleural  fluid analysis/cytology shows adenocarcinoma, GI suspects possible duodenal cancer. Medical Oncology also feels the patient has a metastatic gastrointestinal malignancy  -CEA was 119.0 GI plans to repeat EGD if no conclusion with lung biopsy.  [facility] will need to be contacted back if repeat EGD is needed however given the biopsy results -Was on a full liquid diet and will go to SOFT   Moderate Right Pleural effusion/adenocarcinoma of unknown primary but likely Metastatic Gastrointestinal Malignancy: This is recurrent issue for him.  Underwent thoracentesis by IR 08/09/2024 with a yield of 100 cc fluid.  Fluid analysis consistent with lymphocyte predominant exudative fluid. cytology came back positive for adenocarcinoma unfortunately.   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  Oncology consulted, they are suspecting metastatic gastrointestinal malignancy but could also be a possible pulmonary cancer primary due to left upper lobe nodule; Pulmonary consulted, patient underwent video bronchoscopy with endobronchial ultrasound and electromagnetic navigation procedure 08/14/2024.   -Cytology reveals Adenocarcinoma from Bx -In the meantime, CEA came back significantly elevated at 119.0.  Defer further management to oncology and they are recommending outpatient PET Scan  -Repeat Cx done and showed Interval progression of right pleural effusion with decreased aeration of the right lung but he has no SOB -Will do an Ambulatory home O2 screen prior to discharge however he appears asymptomtatic   Antithrombin 3 Deficiency/Supratherapeutic INR:  Hx of DVT -Chronically anticoagulated on Coumadin , INR 2.7 upon arrival.  Received vitamin K  for reversal. Currently remain on Heparin  gtt and will resume Coumadin  today  given that  GI recommended holding Coumadin  for at least 1 week/until 08/18/2024. No further Procedures to be done Inpt. Check PT-INR in the  AM   Right Renal Mass: Multiple bilateral renal cysts, 1 including 2.1 cm right kidney possible RCC On a CT scan-EDP Dr. Elnor has discussed the findings with on-call Urologist Does not recommend any inpatient further workup -to follow-up as an outpatient.   Type 2 Diabetes Mellitus: Last hemoglobin A1c 6.2, currently not on any medications. CBG Trend ranging from 91-127 on the last 7 Checks  Hypokalemia: K+ is now improved and went from 3.3 -> 3.8 -> 4.2.  CTM and Replete as Necessary. Repeat CMP in the AM   Stage III CKDa: At Baseline. BUN/Cr Trend: Recent Labs  Lab 08/07/24 1352 08/08/24 1037 08/09/24 0830 08/10/24 0627 08/16/24 1005 08/17/24 0539 08/18/24 0526  BUN 25* 36* 23 16 11  7* <5*  CREATININE 0.89 0.95 0.92 0.91 0.98 0.85 0.84  -Avoid Nephrotoxic Medications, Contrast Dyes, Hypotension and Dehydration to Ensure Adequate Renal Perfusion and will need to Renally Adjust Meds. CTM & Trend Renal Function carefully & repeat CMP in the AM   History of Crohn's disease: Stable.   PVD: Continue Atorvastatin  40 mg po Daily.   Dyslipidemia: Continue Atorvastatin  40 mg po Daily.   Hypoalbuminemia: Patient's Albumin  Lvl went from 3.8 -> 3.1 -> 3.2 -> 3.1. CTM & Trend & repeat CMP in the AM  Overweight: Complicates overall prognosis and care. Estimated body mass index is 28.81 kg/m as calculated from the following:   Height as of this encounter: 5' 9 (1.753 m).   Weight as of this encounter: 88.5 kg. Weight Loss and Dietary Counseling given   DVT prophylaxis: SCDs Start: 08/08/24 1636 Place TED hose Start: 08/08/24 1636    Code Status: Full Code Family Communication: D/w Family at beside and sister over the telephone  Disposition Plan:  Level of care: Telemetry Status is: Inpatient Remains inpatient appropriate because: Will be resuming his Coumadin  to ensure that he does not have any more bowel movements   Consultants:  Gastroenterology Pulmonary Medical  Oncology/Hematology   Procedures:  As delineated as above  Antimicrobials:  Anti-infectives (From admission, onward)    None       Subjective: Seen and examined at bedside and was resting.  Denied any chest pain or shortness breath.  No nausea or vomiting.  Denies any other concerns or complaints at this time.  Objective: Vitals:   08/18/24 0426 08/18/24 0500 08/18/24 0800 08/18/24 1711  BP: (!) 155/84  (!) 145/79 (!) 145/99  Pulse: 93  90 97  Resp: 16     Temp: 98 F (36.7 C)  98.5 F (36.9 C) 98.7 F (37.1 C)  TempSrc: Oral  Oral Oral  SpO2: 92%  92% 97%  Weight:  88.5 kg    Height:        Intake/Output Summary (Last 24 hours) at 08/18/2024 1908 Last data filed at 08/18/2024 1524 Gross per 24 hour  Intake 50 ml  Output 1100 ml  Net -1050 ml   Filed Weights   08/14/24 0500 08/17/24 0500 08/18/24 0500  Weight: 88.5 kg 88 kg 88.5 kg   Examination: Physical Exam:  Constitutional: Overweight chronically ill-appearing in no acute distress appears calm Respiratory: Diminished to auscultation bilaterally worse on the right compared to left with some crackles but no appreciable wheezing or rales. Normal respiratory effort and patient is not tachypenic. No accessory muscle use.  Unlabored breathing Cardiovascular: RRR,  no murmurs / rubs / gallops. S1 and S2 auscultated. No extremity edema. Abdomen: Soft, non-tender, Distended 2/2 body habitus. Bowel sounds positive.  GU: Deferred. 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 No induration; Warm and dry.  Neurologic: CN 2-12 grossly intact with no focal deficits. Romberg sign and cerebellar reflexes not assessed.  Psychiatric: Normal judgment and insight. Alert and oriented x 3. Normal mood and appropriate affect.   Data Reviewed: I have personally reviewed following labs and imaging studies  CBC: Recent Labs  Lab 08/14/24 0211 08/15/24 0537  08/16/24 0456 08/17/24 0539 08/18/24 0526  WBC 7.0 6.9 9.2 7.5 7.1  NEUTROABS  --   --   --  4.9 4.6  HGB 10.5* 10.0* 9.4* 10.2* 10.7*  HCT 30.6* 29.4* 27.8* 29.9* 32.6*  MCV 90.3 92.7 92.4 92.9 93.7  PLT 332 318 335 397 378   Basic Metabolic Panel: Recent Labs  Lab 08/16/24 1005 08/17/24 0539 08/18/24 0526  NA 135 138 136  K 3.3* 3.8 4.2  CL 99 101 100  CO2 28 28 28   GLUCOSE 144* 90 97  BUN 11 7* <5*  CREATININE 0.98 0.85 0.84  CALCIUM  8.5* 8.4* 8.7*  MG 1.8 2.0 1.7  PHOS 2.8 3.6 3.8   GFR: Estimated Creatinine Clearance: 95.2 mL/min (by C-G formula based on SCr of 0.84 mg/dL). Liver Function Tests: Recent Labs  Lab 08/16/24 1005 08/17/24 0539 08/18/24 0526  AST 30 18 13*  ALT 40 34 27  ALKPHOS 67 73 72  BILITOT 0.6 0.7 0.7  PROT 5.4* 5.6* 5.5*  ALBUMIN  3.1* 3.2* 3.1*   No results for input(s): LIPASE, AMYLASE in the last 168 hours. No results for input(s): AMMONIA in the last 168 hours. Coagulation Profile: No results for input(s): INR, PROTIME in the last 168 hours. Cardiac Enzymes: No results for input(s): CKTOTAL, CKMB, CKMBINDEX, TROPONINI in the last 168 hours. BNP (last 3 results) Recent Labs    08/08/24 1227  PROBNP <50.0   HbA1C: No results for input(s): HGBA1C in the last 72 hours. CBG: Recent Labs  Lab 08/14/24 1137 08/15/24 0816 08/16/24 0407 08/16/24 0744 08/17/24 0746  GLUCAP 111* 127* 108* 91 125*   Lipid Profile: No results for input(s): CHOL, HDL, LDLCALC, TRIG, CHOLHDL, LDLDIRECT in the last 72 hours. Thyroid  Function Tests: No results for input(s): TSH, T4TOTAL, FREET4, T3FREE, THYROIDAB in the last 72 hours. Anemia Panel: No results for input(s): VITAMINB12, FOLATE, FERRITIN, TIBC, IRON, RETICCTPCT in the last 72 hours. Sepsis Labs: No results for input(s): PROCALCITON, LATICACIDVEN in the last 168 hours.  No results found for this or any previous visit (from the  past 240 hours).   Radiology Studies: DG CHEST PORT 1 VIEW Result Date: 08/18/2024 CLINICAL DATA:  Shortness of breath. EXAM: PORTABLE CHEST 1 VIEW COMPARISON:  Chest radiograph dated 08/14/2024. FINDINGS: Interval progression of right pleural effusion with compressive atelectasis of the majority of the right lung. Pneumonia or underlying mass are not excluded. Overall decreased aeration of the right lung. No pneumothorax. Stable cardiac silhouette. No acute osseous pathology. IMPRESSION: Interval progression of right pleural effusion with decreased aeration of the right lung. Electronically Signed   By: Vanetta Chou M.D.   On: 08/18/2024 18:32   Scheduled Meds:  allopurinol   300 mg Oral Daily   atorvastatin   40 mg Oral Daily   doxazosin   4 mg Oral QHS   melatonin  5 mg Oral QHS  pantoprazole  (PROTONIX ) IV  40 mg Intravenous Q12H   senna-docusate  1 tablet Oral BID   sodium chloride  flush  3 mL Intravenous Q12H   sodium chloride  flush  3 mL Intravenous Q12H   spironolactone   25 mg Oral Daily   Warfarin - Pharmacist Dosing Inpatient   Does not apply q1600   Continuous Infusions:  heparin  1,100 Units/hr (08/18/24 1523)    LOS: 9 days   Jarreau Callanan Lazarus Marker, DO [facility] Available via Epic secure chat 7am-7pm After these hours, please refer to coverage provider listed on [email or web address] 08/18/2024, 7:08 PM  "

## 2024-08-18 NOTE — Progress Notes (Addendum)
 PHARMACY - ANTICOAGULATION  Pharmacy Consult for heparin  / warfarin Indication: antithrombin III  deficiency and Hx DVT  Allergies[1]  Patient Measurements: Height: 5' 9 (175.3 cm) Weight: 88.5 kg (195 lb 1.7 oz) IBW/kg (Calculated) : 70.7 HEPARIN  DW (KG): 87.3  Vital Signs: Temp: 98 F (36.7 C) (01/12 0426) Temp Source: Oral (01/12 0426) BP: 155/84 (01/12 0426) Pulse Rate: 93 (01/12 0426)  Labs: Recent Labs    08/16/24 0456 08/16/24 1005 08/16/24 1203 08/16/24 1857 08/17/24 0539 08/18/24 0526  HGB 9.4*  --   --   --  10.2* 10.7*  HCT 27.8*  --   --   --  29.9* 32.6*  PLT 335  --   --   --  397 378  HEPARINUNFRC  --   --    < > 0.43 0.44 0.27*  CREATININE  --  0.98  --   --  0.85 0.84   < > = values in this interval not displayed.    Estimated Creatinine Clearance: 95.2 mL/min (by C-G formula based on SCr of 0.84 mg/dL).   Assessment: 69 y.o. male with admitted with UGIB, h/o AT3 deficiency, Coumadin  on hold, for heparin .  Heparin  level sub-therapeutic at 027. No signs/sx bleeding or interruptions to the infusion noted.   Goal of Therapy:  Heparin  level 0.3-0.5 units/ml Monitor platelets by anticoagulation protocol: Yes   Plan:  Heparin  to 1100 units / hr Check CBC and HL daily Resuming warfarin today - 7.5 mg po x 1   Thank you. Olam Monte, PharmD  08/18/2024 7:50 AM                   [1]  Allergies Allergen Reactions   Olmesartan      Hx of severe AKI requiring HD on ACE per renal note, hx of AKI and hypotension with olmesartan  07/2023   Mesalamine Rash    REACTION: Rash

## 2024-08-18 NOTE — Plan of Care (Signed)
  Problem: Education: Goal: Knowledge of General Education information will improve Description: Including pain rating scale, medication(s)/side effects and non-pharmacologic comfort measures Outcome: Progressing   Problem: Health Behavior/Discharge Planning: Goal: Ability to manage health-related needs will improve Outcome: Progressing   Problem: Clinical Measurements: Goal: Will remain free from infection Outcome: Progressing Goal: Respiratory complications will improve Outcome: Progressing   Problem: Coping: Goal: Level of anxiety will decrease Outcome: Progressing   

## 2024-08-19 ENCOUNTER — Other Ambulatory Visit (HOSPITAL_COMMUNITY): Payer: Self-pay

## 2024-08-19 ENCOUNTER — Telehealth: Payer: Self-pay

## 2024-08-19 ENCOUNTER — Telehealth (HOSPITAL_COMMUNITY): Payer: Self-pay

## 2024-08-19 ENCOUNTER — Ambulatory Visit

## 2024-08-19 ENCOUNTER — Other Ambulatory Visit: Payer: Self-pay | Admitting: Hematology and Oncology

## 2024-08-19 DIAGNOSIS — C3490 Malignant neoplasm of unspecified part of unspecified bronchus or lung: Secondary | ICD-10-CM

## 2024-08-19 LAB — COMPREHENSIVE METABOLIC PANEL WITH GFR
ALT: 24 U/L (ref 0–44)
AST: 13 U/L — ABNORMAL LOW (ref 15–41)
Albumin: 3.2 g/dL — ABNORMAL LOW (ref 3.5–5.0)
Alkaline Phosphatase: 71 U/L (ref 38–126)
Anion gap: 8 (ref 5–15)
BUN: 6 mg/dL — ABNORMAL LOW (ref 8–23)
CO2: 29 mmol/L (ref 22–32)
Calcium: 8.8 mg/dL — ABNORMAL LOW (ref 8.9–10.3)
Chloride: 98 mmol/L (ref 98–111)
Creatinine, Ser: 0.87 mg/dL (ref 0.61–1.24)
GFR, Estimated: >60 mL/min
Glucose, Bld: 111 mg/dL — ABNORMAL HIGH (ref 70–99)
Potassium: 4.1 mmol/L (ref 3.5–5.1)
Sodium: 134 mmol/L — ABNORMAL LOW (ref 135–145)
Total Bilirubin: 0.7 mg/dL (ref 0.0–1.2)
Total Protein: 5.7 g/dL — ABNORMAL LOW (ref 6.5–8.1)

## 2024-08-19 LAB — PROTIME-INR
INR: 1.2 (ref 0.8–1.2)
Prothrombin Time: 15.9 s — ABNORMAL HIGH (ref 11.4–15.2)

## 2024-08-19 LAB — CBC WITH DIFFERENTIAL/PLATELET
Abs Immature Granulocytes: 0.05 K/uL (ref 0.00–0.07)
Basophils Absolute: 0 K/uL (ref 0.0–0.1)
Basophils Relative: 0 %
Eosinophils Absolute: 0.1 K/uL (ref 0.0–0.5)
Eosinophils Relative: 2 %
HCT: 32.4 % — ABNORMAL LOW (ref 39.0–52.0)
Hemoglobin: 10.8 g/dL — ABNORMAL LOW (ref 13.0–17.0)
Immature Granulocytes: 1 %
Lymphocytes Relative: 19 %
Lymphs Abs: 1.4 K/uL (ref 0.7–4.0)
MCH: 31 pg (ref 26.0–34.0)
MCHC: 33.3 g/dL (ref 30.0–36.0)
MCV: 93.1 fL (ref 80.0–100.0)
Monocytes Absolute: 0.8 K/uL (ref 0.1–1.0)
Monocytes Relative: 11 %
Neutro Abs: 4.9 K/uL (ref 1.7–7.7)
Neutrophils Relative %: 67 %
Platelets: 399 K/uL (ref 150–400)
RBC: 3.48 MIL/uL — ABNORMAL LOW (ref 4.22–5.81)
RDW: 14.5 % (ref 11.5–15.5)
WBC: 7.3 K/uL (ref 4.0–10.5)
nRBC: 0 % (ref 0.0–0.2)

## 2024-08-19 LAB — HEPARIN LEVEL (UNFRACTIONATED): Heparin Unfractionated: 0.41 [IU]/mL (ref 0.30–0.70)

## 2024-08-19 LAB — PHOSPHORUS: Phosphorus: 4 mg/dL (ref 2.5–4.6)

## 2024-08-19 LAB — MAGNESIUM: Magnesium: 1.8 mg/dL (ref 1.7–2.4)

## 2024-08-19 MED ORDER — WARFARIN SODIUM 5 MG PO TABS
7.5000 mg | ORAL_TABLET | Freq: Once | ORAL | Status: AC
  Administered 2024-08-19: 7.5 mg via ORAL
  Filled 2024-08-19: qty 1

## 2024-08-19 MED ORDER — PANTOPRAZOLE SODIUM 40 MG PO TBEC
40.0000 mg | DELAYED_RELEASE_TABLET | Freq: Two times a day (BID) | ORAL | Status: DC
  Administered 2024-08-19 – 2024-08-23 (×9): 40 mg via ORAL
  Filled 2024-08-19 (×9): qty 1

## 2024-08-19 MED ORDER — MAGNESIUM SULFATE 2 GM/50ML IV SOLN
2.0000 g | Freq: Once | INTRAVENOUS | Status: AC
  Administered 2024-08-19: 2 g via INTRAVENOUS
  Filled 2024-08-19: qty 50

## 2024-08-19 NOTE — Plan of Care (Signed)
  Problem: Education: Goal: Knowledge of General Education information will improve Description: Including pain rating scale, medication(s)/side effects and non-pharmacologic comfort measures Outcome: Progressing   Problem: Health Behavior/Discharge Planning: Goal: Ability to manage health-related needs will improve Outcome: Progressing   Problem: Clinical Measurements: Goal: Ability to maintain clinical measurements within normal limits will improve Outcome: Progressing Goal: Will remain free from infection Outcome: Progressing Goal: Diagnostic test results will improve Outcome: Progressing Goal: Respiratory complications will improve Outcome: Progressing Goal: Cardiovascular complication will be avoided Outcome: Progressing   Problem: Activity: Goal: Risk for activity intolerance will decrease Outcome: Progressing   Problem: Nutrition: Goal: Adequate nutrition will be maintained Outcome: Progressing   Problem: Coping: Goal: Level of anxiety will decrease Outcome: Progressing   Problem: Elimination: Goal: Will not experience complications related to bowel motility Outcome: Progressing   Problem: Safety: Goal: Ability to remain free from injury will improve Outcome: Progressing   Problem: Skin Integrity: Goal: Risk for impaired skin integrity will decrease Outcome: Progressing

## 2024-08-19 NOTE — Telephone Encounter (Signed)
 Pharmacy Patient Advocate Encounter  Insurance verification completed.    The patient is insured through [employer]. Patient has Medicare and is not eligible for a copay card, but may be able to apply for patient assistance or Medicare RX Payment Plan (Patient Must reach out to their plan, if eligible for payment plan), if available.    Ran test claim for Lovenox  80mg /0.59ml and the current 30 day co-pay is $5.   This test claim was processed through [employer]- copay amounts may vary at other pharmacies due to [employer], or as the patient moves through the different stages of their insurance plan.

## 2024-08-19 NOTE — Progress Notes (Signed)
 Request for pt's tissue be sent to [facility] medicine for molecular testing emailed to Cheril Fuse, [facility] path tech.

## 2024-08-19 NOTE — Progress Notes (Signed)
 PHARMACY - ANTICOAGULATION  Pharmacy Consult for heparin  / warfarin Indication: antithrombin III  deficiency and Hx DVT  Allergies[1]  Patient Measurements: Height: 5' 9 (175.3 cm) Weight: 87.5 kg (192 lb 14.4 oz) IBW/kg (Calculated) : 70.7 HEPARIN  DW (KG): 87.3  Vital Signs: Temp: 98.7 F (37.1 C) (01/13 0747) Temp Source: Oral (01/13 0747) BP: 153/90 (01/13 0747) Pulse Rate: 94 (01/13 0747)  Labs: Recent Labs    08/17/24 0539 08/18/24 0526 08/19/24 0513  HGB 10.2* 10.7* 10.8*  HCT 29.9* 32.6* 32.4*  PLT 397 378 399  LABPROT  --   --  15.9*  INR  --   --  1.2  HEPARINUNFRC 0.44 0.27* 0.41  CREATININE 0.85 0.84 0.87    Estimated Creatinine Clearance: 91.4 mL/min (by C-G formula based on SCr of 0.87 mg/dL).   Assessment: 69 y.o. male with admitted with UGIB, h/o AT3 deficiency, Coumadin  resumed 1/12, continues on heparin    Heparin  level therapeutic, INR 1.2 today  Goal of Therapy:  Heparin  level 0.3-0.5 units/ml Monitor platelets by anticoagulation protocol: Yes INR 2 to 3   Plan:  Heparin  at 1100 units / hr Repeat Warfarin 7.5 mg po x 1 today Check CBC, INR and HL daily  Thank you. Olam Monte, PharmD  08/19/2024 8:59 AM                    [1]  Allergies Allergen Reactions   Olmesartan      Hx of severe AKI requiring HD on ACE per renal note, hx of AKI and hypotension with olmesartan  07/2023   Mesalamine Rash    REACTION: Rash

## 2024-08-19 NOTE — Progress Notes (Signed)
 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 Presented to ED with chief complaint of black-colored stool and shortness of breath and was found to have moderate size right pleural effusion and possible GI bleed.   GI consulted. Status post EGD and due to oozing ulcer which was stopped. Cytology from pleural fluid shows adenocarcinoma and biopsy also confirmed adenocarcinoma. Oncology and Pulmonology involved. Details below.   The Oncology team feels that he has a metastatic gastrointestinal malignancy but a lung primary could also be possible.  They recommend no further workup at this time and diet is being advanced and given that he was 7 days out from his acute blood loss anemia with fairly stable hemoglobin we will now resume his Coumadin ; Remains on Heparin  gtt and he is to get a 7.5 mg dose once this evening.  Assessment and Plan:  Acute Blood Loss Anemia 2/2 to duodenal ulcer/upper GI bleed, POA: Patient's baseline hemoglobin is around 14 which dropped to 10.3 however has remained stable between 10-11 for last 5 days. Hgb/Hct Trend now fairly stable:  Recent Labs  Lab 08/13/24 0520 08/14/24 0211 08/15/24 0537 08/16/24 0456 08/17/24 0539 08/18/24 0526 08/19/24 0513  HGB 10.2* 10.5* 10.0* 9.4* 10.2* 10.7* 10.8*  HCT 29.2* 30.6* 29.4* 27.8* 29.9* 32.6* 32.4*  MCV 89.6 90.3 92.7 92.4 92.9 93.7 93.1  -FOBT ordered but is still pending.  Seen by GI, status post EGD 08/12/2023, was found to have oozing duodenal ulcer which was stopped by hemostatic spray.  GI recommended IV PPI to every 6 hours.  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 Medical Oncology also feels the patient has a metastatic gastrointestinal malignancy  -CEA was 119.0 GI plans to repeat EGD if no conclusion with lung biopsy.  [facility] will need to be contacted back if repeat EGD is needed however given the biopsy results -Now on SOFT Diet   Moderate Right Pleural effusion/adenocarcinoma of unknown primary but likely Metastatic Gastrointestinal Malignancy: This is recurrent issue for him.  Underwent thoracentesis by IR 08/09/2024 with a yield of 100 cc fluid.  Fluid analysis consistent with lymphocyte predominant exudative fluid. cytology came back positive for adenocarcinoma unfortunately.   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  Oncology consulted, they are suspecting metastatic gastrointestinal malignancy but could also be a possible pulmonary cancer primary due to left upper lobe nodule; Pulmonary consulted, patient underwent video bronchoscopy with endobronchial ultrasound and electromagnetic navigation procedure 08/14/2024.   -Cytology reveals Adenocarcinoma from Bx; Tx to be sent [facility] for Molecular testing being emailed to [facility] Path tech -In the meantime, CEA came back significantly elevated at 119.0.  Defer further management to oncology and they are recommending outpatient PET Scan  -Repeat CXR 08/18/24: Interval progression of right pleural effusion with decreased aeration of the right lung but he has no SOB -Will do an Ambulatory home O2 screen prior to discharge however he appears asymptomatic today; Ordered for Today as well as repeat PT/OT eval  Orthostatic Drop: Asymptomatic. Add  TED Hose. BP dropped went from 125/83 -> 92/68.    Antithrombin 3 Deficiency/Supratherapeutic INR:  Hx of DVT -Chronically anticoagulated on  Coumadin , INR 2.7 upon arrival.  Received vitamin K  for reversal. Currently remain on Heparin  gtt and resumed Coumadin  as GI recommended holding Coumadin  for at least 1 week/until 08/18/2024. No further Procedures to be done Inpt. Currently C/w Bridging and Coumadin  and will get a 7.5 mg Dose tonight. Option to transition to Lovenox  bridge as it is quite affordable ($5) but given his Bleeding and Metastatic Adenocarcinoma, opted to C/w Heparin  and Coumadin  to Monitor for any S/Sx of Active bleeding. Likely can consider Enoxaparin  bridge closer to D/C if INR is close to therapeutic levels, if has no S/Sx of bleeding, and if Agreeable.    Right Renal Mass: Multiple bilateral renal cysts, 1 including 2.1 cm right kidney possible RCC On a CT scan-EDP Dr. Elnor has discussed the findings with on-call Urologist Does not recommend any inpatient further workup -to follow-up as an outpatient.   Type 2 Diabetes Mellitus: Last hemoglobin A1c 6.2, currently not on any medications. CBG Trend ranging from 91-127 on the last 7 Checks  Hypokalemia: Resolved. K+ is now 4.1 on last check. CTM and Replete as Necessary. Repeat CMP in the AM  HypoNa+: Mild. Na+ is now 134. CTM & Trend & repeat CMP in the AM    Stage III CKDa: At Baseline. BUN/Cr Trend very stable and last check was 6/0.87. Avoid Nephrotoxic Medications, Contrast Dyes, Hypotension and Dehydration to Ensure Adequate Renal Perfusion and will need to Renally Adjust Meds. CTM & Trend Renal Function carefully & repeat CMP in the AM   History of Crohn's disease: Stable.   PVD: Continue Atorvastatin  40 mg po Daily.   Dyslipidemia: Continue Atorvastatin  40 mg po Daily.   Hypoalbuminemia: Patient's Albumin  Lvl went from 3.8 -> 3.1 -> 3.2 -> 3.1 -> 3.2. CTM & Trend & repeat CMP in the AM  Overweight: Complicates overall prognosis and care. Estimated body mass index is 28.49 kg/m as calculated from the following:   Height as of this encounter: 5' 9 (1.753  m).   Weight as of this encounter: 87.5 kg. Weight Loss and Dietary Counseling given   DVT prophylaxis: SCDs Start: 08/08/24 1636 Place TED hose Start: 08/08/24 1636    Code Status: Full Code Family Communication: D/w Family present @ bedside  Disposition Plan:  Level of care: Telemetry Status is: Inpatient Remains inpatient appropriate because: Needs to ensure he does not have any bleeding given transition back to Coumadin  given his high risk of bleeding given his recent Bleed and Malignancy. Anticipate D/C when INR is closer to being therapeutic   Consultants:  Gastroenterology Pulmonary Medical Oncology/Hematology  Procedures:  As delineated as above   Antimicrobials:  Anti-infectives (From admission, onward)    None       Subjective: Seen and examined at bedside and is doing okay denied chest pain.  No nausea or vomiting.  Feels okay.  No active bleeding noted.  No other concerns or complaints this time.  Objective: Vitals:   08/19/24 0600 08/19/24 0747 08/19/24 1137 08/19/24 1515  BP:  (!) 153/90 127/79 129/75  Pulse:  94  (!) 108  Resp:  18 18 18   Temp:  98.7 F (37.1 C) 99 F (37.2 C) 98.9 F (37.2 C)  TempSrc:  Oral Oral Oral  SpO2:  94% 94% 98%  Weight: 87.5 kg     Height:  Intake/Output Summary (Last 24 hours) at 08/19/2024 1909 Last data filed at 08/19/2024 1700 Gross per 24 hour  Intake 505.41 ml  Output 750 ml  Net -244.59 ml   Filed Weights   08/17/24 0500 08/18/24 0500 08/19/24 0600  Weight: 88 kg 88.5 kg 87.5 kg   Examination: Physical Exam:  Constitutional: Overweight chronically ill-appearing African-American male no acute distress appears calm Respiratory: Diminished to auscultation bilaterally with some coarse breath sounds worse on the left compared to the right with some crackles. Normal respiratory effort and patient is not tachypenic. No accessory muscle use.  Unlabored breathing Cardiovascular: RRR, no murmurs / rubs /  gallops. S1 and S2 auscultated. No extremity edema.  Abdomen: Soft, non-tender, distended but habitus. Bowel sounds positive.  GU: Deferred. Musculoskeletal: Has a right foot TMA. Skin: No rashes, lesions, ulcers on limited skin evaluation. No induration; Warm and dry.  Neurologic: CN 2-12 grossly intact with no focal deficits. Romberg sign and cerebellar reflexes not assessed.  Psychiatric: Normal judgment and insight. Alert and oriented x 3. Normal mood and appropriate affect.   Data Reviewed: I have personally reviewed following labs and imaging studies  CBC: Recent Labs  Lab 08/15/24 0537 08/16/24 0456 08/17/24 0539 08/18/24 0526 08/19/24 0513  WBC 6.9 9.2 7.5 7.1 7.3  NEUTROABS  --   --  4.9 4.6 4.9  HGB 10.0* 9.4* 10.2* 10.7* 10.8*  HCT 29.4* 27.8* 29.9* 32.6* 32.4*  MCV 92.7 92.4 92.9 93.7 93.1  PLT 318 335 397 378 399   Basic Metabolic Panel: Recent Labs  Lab 08/16/24 1005 08/17/24 0539 08/18/24 0526 08/19/24 0513  NA 135 138 136 134*  K 3.3* 3.8 4.2 4.1  CL 99 101 100 98  CO2 28 28 28 29   GLUCOSE 144* 90 97 111*  BUN 11 7* <5* 6*  CREATININE 0.98 0.85 0.84 0.87  CALCIUM  8.5* 8.4* 8.7* 8.8*  MG 1.8 2.0 1.7 1.8  PHOS 2.8 3.6 3.8 4.0   GFR: Estimated Creatinine Clearance: 91.4 mL/min (by C-G formula based on SCr of 0.87 mg/dL). Liver Function Tests: Recent Labs  Lab 08/16/24 1005 08/17/24 0539 08/18/24 0526 08/19/24 0513  AST 30 18 13* 13*  ALT 40 34 27 24  ALKPHOS 67 73 72 71  BILITOT 0.6 0.7 0.7 0.7  PROT 5.4* 5.6* 5.5* 5.7*  ALBUMIN  3.1* 3.2* 3.1* 3.2*   No results for input(s): LIPASE, AMYLASE in the last 168 hours. No results for input(s): AMMONIA in the last 168 hours. Coagulation Profile: Recent Labs  Lab 08/19/24 0513  INR 1.2   Cardiac Enzymes: No results for input(s): CKTOTAL, CKMB, CKMBINDEX, TROPONINI in the last 168 hours. BNP (last 3 results) Recent Labs    08/08/24 1227  PROBNP <50.0   HbA1C: No results for  input(s): HGBA1C in the last 72 hours. CBG: Recent Labs  Lab 08/14/24 1137 08/15/24 0816 08/16/24 0407 08/16/24 0744 08/17/24 0746  GLUCAP 111* 127* 108* 91 125*   Lipid Profile: No results for input(s): CHOL, HDL, LDLCALC, TRIG, CHOLHDL, LDLDIRECT in the last 72 hours. Thyroid  Function Tests: No results for input(s): TSH, T4TOTAL, FREET4, T3FREE, THYROIDAB in the last 72 hours. Anemia Panel: No results for input(s): VITAMINB12, FOLATE, FERRITIN, TIBC, IRON, RETICCTPCT in the last 72 hours. Sepsis Labs: No results for input(s): PROCALCITON, LATICACIDVEN in the last 168 hours.  No results found for this or any previous visit (from the past 240 hours).   Radiology Studies: DG CHEST PORT 1 VIEW Result Date: 08/18/2024 CLINICAL  DATA:  Shortness of breath. EXAM: PORTABLE CHEST 1 VIEW COMPARISON:  Chest radiograph dated 08/14/2024. FINDINGS: Interval progression of right pleural effusion with compressive atelectasis of the majority of the right lung. Pneumonia or underlying mass are not excluded. Overall decreased aeration of the right lung. No pneumothorax. Stable cardiac silhouette. No acute osseous pathology. IMPRESSION: Interval progression of right pleural effusion with decreased aeration of the right lung. Electronically Signed   By: Vanetta Chou M.D.   On: 08/18/2024 18:32   Scheduled Meds:  allopurinol   300 mg Oral Daily   atorvastatin   40 mg Oral Daily   doxazosin   4 mg Oral QHS   melatonin  5 mg Oral QHS   pantoprazole   40 mg Oral BID   senna-docusate  1 tablet Oral BID   sodium chloride  flush  3 mL Intravenous Q12H   sodium chloride  flush  3 mL Intravenous Q12H   spironolactone   25 mg Oral Daily   Warfarin - Pharmacist Dosing Inpatient   Does not apply q1600   Continuous Infusions:  heparin  1,100 Units/hr (08/19/24 1412)    LOS: 10 days   Alejandro Marker, DO [facility] Available via Epic secure chat 7am-7pm After  these hours, please refer to coverage provider listed on [email or web address] 08/19/2024, 7:09 PM  "

## 2024-08-19 NOTE — Progress Notes (Signed)
 Physical Therapy Treatment Patient Details Name: Rick Edwards MRN: [id number] DOB: 06-27-1956 Today's Date: 08/19/2024   History of Present Illness 69 year old male Presented to ED 08/07/24 with chief complaint of black-colored stool. +GIB, R pleural effusion, R renal mass, 1/3 thoracentesis with vagal episode with BP 76/50, cytology positive for adenocarcinoma. Bronch with LUL nodule biopsy on 1/8. 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 Noted a significant 33 point drop systolically and 15 point drop diastolically of BP from sitting to standing, however pt remained asymptomatic. Overall, pt able to mobilize well, however tolerance for functional activity remains decreased. Pt reports he has no concerns about being able to care for himself upon d/c. Anticipate endurance will improve as pt is up and around in his own environment at home. Will continue to follow.   Orthostatic BPs  Sitting 125/83  Standing 92/68 - asymptomatic  Standing after 1st walk (200') 90/66 - asymptomatic  Standing after 2nd walk (200') 92/68 - asymptomatic       If plan is discharge home, recommend the following: Assistance with cooking/housework;Assist for transportation;Help with stairs or ramp for entrance   Can travel by private vehicle        Equipment Recommendations  None recommended by PT    Recommendations for Other Services       Precautions / Restrictions Precautions Precautions: Fall Recall of Precautions/Restrictions: Intact Precaution/Restrictions Comments: orthostasis during admission Restrictions Weight Bearing Restrictions Per Provider Order: No     Mobility  Bed Mobility Overal bed mobility: Modified Independent             General bed mobility comments: HOB slightly elevated. Pt  with no difficulty transitioning to EOB.    Transfers Overall transfer level: Modified independent Equipment used: None               General transfer comment: Pt with several sit<>stands throughout session. No assist required to power up to full stand.    Ambulation/Gait Ambulation/Gait assistance: Supervision, Contact guard assist Gait Distance (Feet): 200 Feet (x2) Assistive device: IV Pole Gait Pattern/deviations: Step-through pattern, Trunk flexed Gait velocity: reduced Gait velocity interpretation: 1.31 - 2.62 ft/sec, indicative of limited community ambulator   General Gait Details: 2 rounds of gait training, 200' each. Pt pushing IV pole but not leaning on it for support. Pt moderately dyspneic after first walk, and mildly dyspneic after second walk. SpO2 high 90's on RA throughout. He does not report any lightheadedness or dizziness throughout OOB mobility.   Stairs             Wheelchair Mobility     Tilt Bed    Modified Rankin (Stroke Patients Only)       Balance Overall balance assessment: Needs assistance Sitting-balance support: No upper extremity supported, Feet supported Sitting balance-Leahy Scale: Good     Standing balance support: No upper extremity supported, During functional activity Standing balance-Leahy Scale: Fair                              Hotel Manager: Impaired Factors Affecting Communication: Hearing impaired  Cognition Arousal: Alert Behavior During Therapy: WFL for tasks assessed/performed   PT - Cognitive impairments: Difficult to assess (pt hard of hearing, difficult to discern if pt has slowed processing or if this is related  to hearing impairment)                         Following commands: Intact      Cueing Cueing Techniques: Verbal cues  Exercises General Exercises - Lower Extremity Long Arc Quad: 15 reps, Both, AROM, Seated    General Comments         Pertinent Vitals/Pain Pain Assessment Pain Assessment: No/denies pain    Home Living                          Prior Function            PT Goals (current goals can now be found in the care plan section) Acute Rehab PT Goals Patient Stated Goal: Be able to go home Progress towards PT goals: Progressing toward goals    Frequency    Min 2X/week      PT Plan      Co-evaluation              AM-PAC PT 6 Clicks Mobility   Outcome Measure  Help needed turning from your back to your side while in a flat bed without using bedrails?: None Help needed moving from lying on your back to sitting on the side of a flat bed without using bedrails?: None Help needed moving to and from a bed to a chair (including a wheelchair)?: None Help needed standing up from a chair using your arms (e.g., wheelchair or bedside chair)?: None Help needed to walk in hospital room?: A Little Help needed climbing 3-5 steps with a railing? : A Little 6 Click Score: 22    End of Session Equipment Utilized During Treatment: Gait belt Activity Tolerance: Patient tolerated treatment well Patient left: in chair;with call bell/phone within reach Nurse Communication: Mobility status PT Visit Diagnosis: Difficulty in walking, not elsewhere classified (R26.2);Dizziness and giddiness (R42)     Time: 8666-8645 PT Time Calculation (min) (ACUTE ONLY): 21 min  Charges:    $Gait Training: 8-22 mins PT General Charges $$ ACUTE PT VISIT: 1 Visit                     Leita Sable, PT, DPT Acute Rehabilitation Services Secure Chat Preferred Office: [phone]    [name] 08/19/2024, 2:56 PM

## 2024-08-19 NOTE — Telephone Encounter (Signed)
 Review of chart due to pt missing coumadin  clinic apt today.  Pt entered ER on 1/1 for rectal bleeding and has been hospitalized since then. EGD revealed bleeding from ozzing ulcer. Cytology of pleural fluid and biopsy revealed adenocarcinoma. Oncology and pulmonary feel this could be a metastatic gastrointestinal malignancy but could possibly be lung primary.   Coumadin  clinic apt cancelled.

## 2024-08-20 DIAGNOSIS — K922 Gastrointestinal hemorrhage, unspecified: Secondary | ICD-10-CM | POA: Diagnosis not present

## 2024-08-20 LAB — GLUCOSE, CAPILLARY
Glucose-Capillary: 102 mg/dL — ABNORMAL HIGH (ref 70–99)
Glucose-Capillary: 129 mg/dL — ABNORMAL HIGH (ref 70–99)
Glucose-Capillary: 125 mg/dL — ABNORMAL HIGH (ref 70–99)

## 2024-08-20 LAB — CBC WITH DIFFERENTIAL/PLATELET
Abs Immature Granulocytes: 0.06 K/uL (ref 0.00–0.07)
Basophils Absolute: 0 K/uL (ref 0.0–0.1)
Basophils Relative: 1 %
Eosinophils Absolute: 0.1 K/uL (ref 0.0–0.5)
Eosinophils Relative: 1 %
HCT: 35.2 % — ABNORMAL LOW (ref 39.0–52.0)
Hemoglobin: 11.9 g/dL — ABNORMAL LOW (ref 13.0–17.0)
Immature Granulocytes: 1 %
Lymphocytes Relative: 26 %
Lymphs Abs: 1.8 K/uL (ref 0.7–4.0)
MCH: 31.2 pg (ref 26.0–34.0)
MCHC: 33.8 g/dL (ref 30.0–36.0)
MCV: 92.4 fL (ref 80.0–100.0)
Monocytes Absolute: 0.8 K/uL (ref 0.1–1.0)
Monocytes Relative: 12 %
Neutro Abs: 4.1 K/uL (ref 1.7–7.7)
Neutrophils Relative %: 59 %
Platelets: 431 K/uL — ABNORMAL HIGH (ref 150–400)
RBC: 3.81 MIL/uL — ABNORMAL LOW (ref 4.22–5.81)
RDW: 14.6 % (ref 11.5–15.5)
WBC: 6.8 K/uL (ref 4.0–10.5)
nRBC: 0 % (ref 0.0–0.2)

## 2024-08-20 LAB — COMPREHENSIVE METABOLIC PANEL WITH GFR
ALT: 27 U/L (ref 0–44)
AST: 13 U/L — ABNORMAL LOW (ref 15–41)
Albumin: 3.4 g/dL — ABNORMAL LOW (ref 3.5–5.0)
Alkaline Phosphatase: 77 U/L (ref 38–126)
Anion gap: 9 (ref 5–15)
BUN: 6 mg/dL — ABNORMAL LOW (ref 8–23)
CO2: 29 mmol/L (ref 22–32)
Calcium: 9 mg/dL (ref 8.9–10.3)
Chloride: 97 mmol/L — ABNORMAL LOW (ref 98–111)
Creatinine, Ser: 0.95 mg/dL (ref 0.61–1.24)
GFR, Estimated: >60 mL/min
Glucose, Bld: 106 mg/dL — ABNORMAL HIGH (ref 70–99)
Potassium: 4.2 mmol/L (ref 3.5–5.1)
Sodium: 135 mmol/L (ref 135–145)
Total Bilirubin: 0.7 mg/dL (ref 0.0–1.2)
Total Protein: 6.2 g/dL — ABNORMAL LOW (ref 6.5–8.1)

## 2024-08-20 LAB — HEPARIN LEVEL (UNFRACTIONATED): Heparin Unfractionated: 0.37 [IU]/mL (ref 0.30–0.70)

## 2024-08-20 LAB — PROTIME-INR
INR: 1.2 (ref 0.8–1.2)
Prothrombin Time: 16.3 s — ABNORMAL HIGH (ref 11.4–15.2)

## 2024-08-20 LAB — MAGNESIUM: Magnesium: 1.9 mg/dL (ref 1.7–2.4)

## 2024-08-20 LAB — PHOSPHORUS: Phosphorus: 3.5 mg/dL (ref 2.5–4.6)

## 2024-08-20 MED ORDER — WARFARIN SODIUM 5 MG PO TABS
7.5000 mg | ORAL_TABLET | Freq: Once | ORAL | Status: AC
  Administered 2024-08-20: 7.5 mg via ORAL
  Filled 2024-08-20: qty 1

## 2024-08-20 NOTE — Progress Notes (Signed)
 PHARMACY - ANTICOAGULATION  Pharmacy Consult for heparin  / warfarin Indication: antithrombin III  deficiency and Hx DVT  Allergies[1]  Patient Measurements: Height: 5' 9 (175.3 cm) Weight: 86.3 kg (190 lb 4.1 oz) IBW/kg (Calculated) : 70.7 HEPARIN  DW (KG): 87.3  Vital Signs: Temp: 98.6 F (37 C) (01/14 0829) Temp Source: Oral (01/14 0829) BP: 134/81 (01/14 0829) Pulse Rate: 102 (01/14 0829)  Labs: Recent Labs    08/18/24 0526 08/19/24 0513 08/20/24 0531 08/20/24 0853  HGB 10.7* 10.8* 11.9*  --   HCT 32.6* 32.4* 35.2*  --   PLT 378 399 431*  --   LABPROT  --  15.9*  --  16.3*  INR  --  1.2  --  1.2  HEPARINUNFRC 0.27* 0.41 0.37  --   CREATININE 0.84 0.87 0.95  --     Estimated Creatinine Clearance: 83.2 mL/min (by C-G formula based on SCr of 0.95 mg/dL).   Assessment: 69 y.o. male with admitted with UGIB, h/o AT3 deficiency, Coumadin  resumed 1/12, continues on heparin    Heparin  level therapeutic, INR still 1.2 today  Goal of Therapy:  Heparin  level 0.3-0.5 units/ml Monitor platelets by anticoagulation protocol: Yes INR 2 to 3   Plan:  Continue Heparin  at 1100 units / hr Repeat Warfarin 7.5 mg po x 1 today Check CBC, INR and HL daily  Donny Alert, PharmD, [place] Clinical Pharmacist Please see AMION for all Pharmacists' Contact Phone Numbers 08/20/2024, 11:43 AM                       [1]  Allergies Allergen Reactions   Olmesartan      Hx of severe AKI requiring HD on ACE per renal note, hx of AKI and hypotension with olmesartan  07/2023   Mesalamine Rash    REACTION: Rash

## 2024-08-20 NOTE — Progress Notes (Signed)
 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 Presented to ED with chief complaint of black-colored stool and shortness of breath and was found to have moderate size right pleural effusion and possible GI bleed.    GI consulted. Status post EGD and due to oozing ulcer which was stopped. Cytology from pleural fluid shows adenocarcinoma and biopsy also confirmed adenocarcinoma. Oncology and Pulmonology involved. Details below.    The Oncology team feels that he has a metastatic gastrointestinal malignancy but a lung primary could also be possible.  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 Hgb/Hct Trend now fairly stable:            Seen by GI, status post EGD 08/12/2023, was found to have  oozing duodenal ulcer which was stopped by hemostatic spray.  GI recommended IV PPI to every 6 hours.  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 Medical Oncology also feels the patient has a metastatic gastrointestinal malignancy  -CEA was 119.0 GI plans to repeat EGD if no conclusion with lung biopsy.   -Now on SOFT Diet   Moderate Right Pleural effusion/adenocarcinoma of unknown primary but likely Metastatic Gastrointestinal Malignancy: This is recurrent issue for him.  Underwent thoracentesis by IR 08/09/2024 with a yield of 100 cc fluid.  Fluid analysis consistent with lymphocyte predominant exudative fluid. cytology came back positive for adenocarcinoma unfortunately.   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  Oncology consulted, they are suspecting metastatic gastrointestinal malignancy but could also be a possible pulmonary cancer primary due to left upper lobe nodule; Pulmonary consulted, patient underwent video bronchoscopy with endobronchial ultrasound and electromagnetic navigation procedure 08/14/2024.   -Cytology reveals Adenocarcinoma from Bx; Tx to be sent [facility] for Molecular testing being emailed to [facility] Path tech -In the meantime, CEA came back significantly elevated at 119.0.  Defer further management to oncology and they are recommending outpatient PET Scan  -Repeat CXR 08/18/24: Interval progression of right pleural effusion with decreased aeration of the right lung but he has no SOB  Ordered a home O2  evaluation.   Orthostatic Drop: Asymptomatic. Add TED Hose. BP dropped went from 125/83 -> 92/68.    Antithrombin 3 Deficiency/Supratherapeutic INR:  Hx of DVT -Chronically anticoagulated on  Coumadin , INR 2.7 upon arrival.  Received vitamin K  for reversal. Currently remain on Heparin  gtt and resumed Coumadin   Option to transition to Lovenox  bridge as it is quite affordable ($5) but given his Bleeding and Metastatic Adenocarcinoma, opted to C/w Heparin  and Coumadin  to Monitor for any S/Sx of Active bleeding.    Right Renal Mass: Multiple bilateral renal cysts, 1 including 2.1 cm right kidney possible RCC On a CT scan-EDP Dr. Elnor has discussed the findings with on-call Urologist Does not recommend any inpatient further workup -to follow-up as an outpatient.   Type 2 Diabetes Mellitus: Last hemoglobin A1c 6.2, currently not on any medications.    Hypokalemia: prn repletion   Hyponatremia: Last sodium of 134   Stage III CKDa: At Baseline.   History of Crohn's disease: Stable.   PVD: Continue Atorvastatin  40 mg po Daily.   Dyslipidemia: Continue Atorvastatin  40 mg po Daily.  Disposition: Home. IADL  DVT prophylaxis: SCDs Start: 08/08/24 1636 Place TED hose Start: 08/08/24 1636     Code Status: Full Code Family Communication:  None at the bedside Status is: Inpatient Remains inpatient appropriate because: On heparin  drip    Subjective:  No acute events overnight. We spoke about heparin  drip and the need for bridging. He lives at home by himself and is IADL. His family members live close by.  Examination:  General exam: Appears calm and comfortable  Respiratory system: Clear to auscultation. Respiratory effort normal. Cardiovascular system: S1 & S2 heard, RRR. No JVD, murmurs, rubs, gallops or clicks. No pedal edema. Gastrointestinal system: Abdomen is nondistended, soft and nontender. No organomegaly or masses felt. Normal bowel sounds heard. Central nervous system: Alert and oriented. No focal neurological deficits. Extremities: Symmetric 5 x 5 power. Skin: No rashes, lesions or ulcers Psychiatry: Judgement and insight appear normal. Mood & affect  appropriate.       Diet Orders (From admission, onward)     Start     Ordered   08/18/24 1202  DIET SOFT Room service appropriate? Yes; Fluid consistency: Thin  Diet effective now       Question Answer Comment  Room service appropriate? Yes   Fluid consistency: Thin      08/18/24 1201            Objective: Vitals:   08/19/24 2000 08/20/24 0423 08/20/24 0506 08/20/24 0829  BP: (!) 129/93 (!) 136/95  134/81  Pulse: (!) 104 99  (!) 102  Resp: 18 18  18   Temp: 98 F (36.7 C) 98.1 F (36.7 C)  98.6 F (37 C)  TempSrc: Oral Oral  Oral  SpO2: 93% 94%  95%  Weight:   86.3 kg   Height:        Intake/Output Summary (Last 24 hours) at 08/20/2024 1043 Last data filed at 08/20/2024 0829 Gross per 24 hour  Intake 501.29 ml  Output 450 ml  Net 51.29 ml   Filed Weights   08/18/24 0500 08/19/24 0600 08/20/24 0506  Weight: 88.5 kg 87.5 kg 86.3 kg    Scheduled Meds:  allopurinol   300 mg Oral Daily   atorvastatin   40 mg Oral Daily   doxazosin   4 mg Oral QHS   melatonin  5 mg Oral QHS   pantoprazole   40 mg Oral BID   senna-docusate  1  tablet Oral BID   sodium chloride  flush  3 mL Intravenous Q12H   sodium chloride  flush  3 mL Intravenous Q12H   spironolactone   25 mg Oral Daily   Warfarin - Pharmacist Dosing Inpatient   Does not apply q1600   Continuous Infusions:  heparin  1,100 Units/hr (08/19/24 1412)    Nutritional status     Body mass index is 28.1 kg/m.  Data Reviewed:   CBC: Recent Labs  Lab 08/16/24 0456 08/17/24 0539 08/18/24 0526 08/19/24 0513 08/20/24 0531  WBC 9.2 7.5 7.1 7.3 6.8  NEUTROABS  --  4.9 4.6 4.9 4.1  HGB 9.4* 10.2* 10.7* 10.8* 11.9*  HCT 27.8* 29.9* 32.6* 32.4* 35.2*  MCV 92.4 92.9 93.7 93.1 92.4  PLT 335 397 378 399 431*   Basic Metabolic Panel: Recent Labs  Lab 08/16/24 1005 08/17/24 0539 08/18/24 0526 08/19/24 0513 08/20/24 0531  NA 135 138 136 134* 135  K 3.3* 3.8 4.2 4.1 4.2  CL 99 101 100 98 97*  CO2 28 28 28 29  29   GLUCOSE 144* 90 97 111* 106*  BUN 11 7* <5* 6* 6*  CREATININE 0.98 0.85 0.84 0.87 0.95  CALCIUM  8.5* 8.4* 8.7* 8.8* 9.0  MG 1.8 2.0 1.7 1.8 1.9  PHOS 2.8 3.6 3.8 4.0 3.5   GFR: Estimated Creatinine Clearance: 83.2 mL/min (by C-G formula based on SCr of 0.95 mg/dL). Liver Function Tests: Recent Labs  Lab 08/16/24 1005 08/17/24 0539 08/18/24 0526 08/19/24 0513 08/20/24 0531  AST 30 18 13* 13* 13*  ALT 40 34 27 24 27   ALKPHOS 67 73 72 71 77  BILITOT 0.6 0.7 0.7 0.7 0.7  PROT 5.4* 5.6* 5.5* 5.7* 6.2*  ALBUMIN  3.1* 3.2* 3.1* 3.2* 3.4*   No results for input(s): LIPASE, AMYLASE in the last 168 hours. No results for input(s): AMMONIA in the last 168 hours. Coagulation Profile: Recent Labs  Lab 08/19/24 0513  INR 1.2   Cardiac Enzymes: No results for input(s): CKTOTAL, CKMB, CKMBINDEX, TROPONINI in the last 168 hours. BNP (last 3 results) Recent Labs    08/08/24 1227  PROBNP <50.0   HbA1C: No results for input(s): HGBA1C in the last 72 hours. CBG: Recent Labs  Lab 08/15/24 0816 08/16/24 0407 08/16/24 0744 08/17/24 0746 08/20/24 0718  GLUCAP 127* 108* 91 125* 102*   Lipid Profile: No results for input(s): CHOL, HDL, LDLCALC, TRIG, CHOLHDL, LDLDIRECT in the last 72 hours. Thyroid  Function Tests: No results for input(s): TSH, T4TOTAL, FREET4, T3FREE, THYROIDAB in the last 72 hours. Anemia Panel: No results for input(s): VITAMINB12, FOLATE, FERRITIN, TIBC, IRON, RETICCTPCT in the last 72 hours. Sepsis Labs: No results for input(s): PROCALCITON, LATICACIDVEN in the last 168 hours.  No results found for this or any previous visit (from the past 240 hours).       Radiology Studies: No results found.         LOS: 11 days   Time spent= 35 mins    Deliliah Room, MD [facility]  If 7PM-7AM, please contact night-coverage  08/20/2024, 10:43 AM  "

## 2024-08-20 NOTE — Evaluation (Signed)
 Occupational Therapy Evaluation Patient Details Name: Rick Edwards MRN: [id number] DOB: 02-06-1956 Today's Date: 08/20/2024   History of Present Illness   69 year old male Presented to ED 08/07/24 with chief complaint of black-colored stool. +GIB, R pleural effusion, R renal mass, 1/3 thoracentesis with vagal episode with BP 76/50, cytology positive for adenocarcinoma. Bronch with LUL nodule biopsy on 1/8. PMH significant of: Antithrombin III  deficiency chronically on Coumadin , CKD, Crohn's disease, chronic anemia, hypercalcemic hyperkalemia HTN, ileostomy/takedown 2014, PVD, DM     Clinical Impressions Pt was living alone prior to admission, walking without AD and driving. He reports a friend assisted with sponge bathing due to pt's inability to step over edge of tub. Pt presents with generalized weakness. He ambulates with IV pole and supervision and needs assist for pericare due to IV site in dominant hand. Educated pt in availability of tub transfer bench so he may shower safely, will demonstrate its use next visit. Do not anticipate pt will need post acute OT.    If plan is discharge home, recommend the following:   A little help with bathing/dressing/bathroom;Assistance with cooking/housework;Help with stairs or ramp for entrance     Functional Status Assessment   Patient has had a recent decline in their functional status and demonstrates the ability to make significant improvements in function in a reasonable and predictable amount of time.     Equipment Recommendations   Tub/shower bench     Recommendations for Other Services         Precautions/Restrictions   Precautions Precautions: Fall Recall of Precautions/Restrictions: Intact Precaution/Restrictions Comments: orthostasis during admission Restrictions Weight Bearing Restrictions Per Provider Order: No     Mobility Bed Mobility Overal bed mobility: Modified Independent                   Transfers Overall transfer level: Needs assistance Equipment used: None Transfers: Sit to/from Stand Sit to Stand: Supervision           General transfer comment: supervised for safety and IV line      Balance Overall balance assessment: Needs assistance Sitting-balance support: No upper extremity supported, Feet supported Sitting balance-Leahy Scale: Good       Standing balance-Leahy Scale: Fair                             ADL either performed or assessed with clinical judgement   ADL Overall ADL's : Needs assistance/impaired Eating/Feeding: Independent;Sitting;Bed level   Grooming: Supervision/safety;Standing;Wash/dry hands   Upper Body Bathing: Set up;Sitting   Lower Body Bathing: Supervison/ safety;Sit to/from stand   Upper Body Dressing : Set up;Sitting   Lower Body Dressing: Supervision/safety;Sit to/from stand   Toilet Transfer: Supervision/safety;Ambulation (pushed IV pole)   Toileting- Clothing Manipulation and Hygiene: Total assistance;Sit to/from stand Toileting - Clothing Manipulation Details (indicate cue type and reason): assisted due to IV in R hand     Functional mobility during ADLs: Supervision/safety (pushed IV pole)       Vision Baseline Vision/History: 0 No visual deficits Ability to See in Adequate Light: 0 Adequate Patient Visual Report: No change from baseline       Perception         Praxis         Pertinent Vitals/Pain Pain Assessment Pain Assessment: No/denies pain     Extremity/Trunk Assessment Upper Extremity Assessment Upper Extremity Assessment: Overall WFL for tasks assessed;Right hand dominant   Lower Extremity Assessment Lower Extremity  Assessment: Defer to PT evaluation   Cervical / Trunk Assessment Cervical / Trunk Assessment: Normal   Communication Communication Communication: Impaired Factors Affecting Communication: Hearing impaired   Cognition Arousal: Alert Behavior During  Therapy: WFL for tasks assessed/performed Cognition: No apparent impairments                               Following commands: Intact       Cueing  General Comments   Cueing Techniques: Verbal cues      Exercises     Shoulder Instructions      Home Living Family/patient expects to be discharged to:: Private residence Living Arrangements: Alone Available Help at Discharge: Family;Available 24 hours/day Type of Home: House Home Access: Stairs to enter [employer] of Steps: 4 Entrance Stairs-Rails: Right (pole) Home Layout: One level     Bathroom Shower/Tub: Chief Strategy Officer: Standard     Home Equipment: Agricultural Consultant (2 wheels)          Prior Functioning/Environment Prior Level of Function : Needs assist               ADLs Comments: a  friend helps him sponge bathes daily at 2 pm, pt would like to be able to shower, but cannot step over edge of tub    OT Problem List: Impaired balance (sitting and/or standing);Decreased knowledge of use of DME or AE   OT Treatment/Interventions: Self-care/ADL training;DME and/or AE instruction;Therapeutic activities;Patient/family education;Balance training      OT Goals(Current goals can be found in the care plan section)   Acute Rehab OT Goals OT Goal Formulation: With patient Time For Goal Achievement: 09/03/24 Potential to Achieve Goals: Good ADL Goals Pt Will Perform Grooming: with modified independence;standing Pt Will Perform Upper Body Dressing: with modified independence;sitting Pt Will Perform Lower Body Dressing: with modified independence;sit to/from stand Pt Will Transfer to Toilet: with modified independence;ambulating;regular height toilet Pt Will Perform Toileting - Clothing Manipulation and hygiene: with modified independence;sit to/from stand Pt Will Perform Tub/Shower Transfer: Tub transfer;with supervision;tub bench Additional ADL Goal #1: Pt will  gather items necessary for ADLs mod I.   OT Frequency:  Min 2X/week    Co-evaluation              AM-PAC OT 6 Clicks Daily Activity     Outcome Measure Help from another person eating meals?: None Help from another person taking care of personal grooming?: A Little Help from another person toileting, which includes using toliet, bedpan, or urinal?: A Little Help from another person bathing (including washing, rinsing, drying)?: A Little Help from another person to put on and taking off regular upper body clothing?: A Little Help from another person to put on and taking off regular lower body clothing?: A Little 6 Click Score: 19   End of Session Equipment Utilized During Treatment: Gait belt Nurse Communication: Other (comment) (pt had BM, non bloody)  Activity Tolerance: Patient tolerated treatment well Patient left: in chair;with call bell/phone within reach  OT Visit Diagnosis: Unsteadiness on feet (R26.81);Muscle weakness (generalized) (M62.81)                Time: 8996-8977 OT Time Calculation (min): 19 min Charges:  OT General Charges $OT Visit: 1 Visit OT Evaluation $OT Eval Low Complexity: 1 Low  Rick Edwards, OTR/L Acute Rehabilitation Services Office: [phone]   Rick Edwards 08/20/2024, 11:16 AM

## 2024-08-20 NOTE — Plan of Care (Signed)
  Problem: Education: Goal: Knowledge of General Education information will improve Description: Including pain rating scale, medication(s)/side effects and non-pharmacologic comfort measures Outcome: Progressing   Problem: Health Behavior/Discharge Planning: Goal: Ability to manage health-related needs will improve Outcome: Progressing   Problem: Clinical Measurements: Goal: Ability to maintain clinical measurements within normal limits will improve Outcome: Progressing Goal: Will remain free from infection Outcome: Progressing Goal: Diagnostic test results will improve Outcome: Progressing Goal: Respiratory complications will improve Outcome: Progressing Goal: Cardiovascular complication will be avoided Outcome: Progressing   Problem: Activity: Goal: Risk for activity intolerance will decrease Outcome: Progressing   Problem: Nutrition: Goal: Adequate nutrition will be maintained Outcome: Progressing   Problem: Coping: Goal: Level of anxiety will decrease Outcome: Progressing   Problem: Elimination: Goal: Will not experience complications related to bowel motility Outcome: Progressing   Problem: Safety: Goal: Ability to remain free from injury will improve Outcome: Progressing   Problem: Skin Integrity: Goal: Risk for impaired skin integrity will decrease Outcome: Progressing

## 2024-08-21 ENCOUNTER — Ambulatory Visit

## 2024-08-21 DIAGNOSIS — K922 Gastrointestinal hemorrhage, unspecified: Secondary | ICD-10-CM | POA: Diagnosis not present

## 2024-08-21 LAB — PROTIME-INR
INR: 1.5 — ABNORMAL HIGH (ref 0.8–1.2)
Prothrombin Time: 18.7 s — ABNORMAL HIGH (ref 11.4–15.2)

## 2024-08-21 LAB — GLUCOSE, CAPILLARY
Glucose-Capillary: 149 mg/dL — ABNORMAL HIGH (ref 70–99)
Glucose-Capillary: 134 mg/dL — ABNORMAL HIGH (ref 70–99)
Glucose-Capillary: 141 mg/dL — ABNORMAL HIGH (ref 70–99)

## 2024-08-21 LAB — HEPARIN LEVEL (UNFRACTIONATED): Heparin Unfractionated: 0.49 [IU]/mL (ref 0.30–0.70)

## 2024-08-21 MED ORDER — WARFARIN SODIUM 5 MG PO TABS
7.5000 mg | ORAL_TABLET | Freq: Once | ORAL | Status: AC
  Administered 2024-08-21: 7.5 mg via ORAL
  Filled 2024-08-21: qty 1

## 2024-08-21 NOTE — Progress Notes (Signed)
 Physical Therapy Treatment Patient Details Name: Rick Edwards MRN: [id number] DOB: 07-Jan-1956 Today's Date: 08/21/2024   History of Present Illness 69 y.o. male presented to ED 08/07/24 with chief complaint of black-colored stool. +GIB, R pleural effusion, R renal mass, 1/3 thoracentesis with vagal episode with BP 76/50, cytology positive for adenocarcinoma. Bronch with LUL nodule biopsy on 1/8. PMH significant of: Antithrombin III  deficiency chronically on Coumadin , CKD, Crohn's disease, chronic anemia, hypercalcemic hyperkalemia HTN, ileostomy/takedown 2014, PVD, DM.    PT Comments  Pt greeted supine in bed, pleasant and agreeable to PT session. He declined to wear bilateral ted hose. Pt continues to be positive for orthostatic hypotension, but is asymptomatic. He ambulated up/down the hallway using IV pole with close supervision for safety. He navigated a slight incline/decline. Pt completed 2 steps twice with unilateral UE support using a step-to pattern. He completed the 5xSTS and 30-second chair stand from recliner chair, pushing up from knees. Pt took 24.02 seconds and completed 10 stands respectively. He is below his age-match norm in both test 11.4 seconds and 12-18 stands, which indicates he has decreased functional lower extremity strength and endurance. Will continue to follow acutely and advance appropriately.    08/21/24 1100  Vital Signs  Patient Position (if appropriate) Orthostatic Vitals  Orthostatic Lying   BP- Lying 146/89  Orthostatic Sitting  BP- Sitting 123/88  Orthostatic Standing at 0 minutes  BP- Standing at 0 minutes 102/74  Orthostatic Standing at 3 minutes  BP- Standing at 3 minutes 113/68     If plan is discharge home, recommend the following: Assistance with cooking/housework;Assist for transportation;Help with stairs or ramp for entrance   Can travel by private vehicle        Equipment Recommendations  None recommended by PT    Recommendations for  Other Services       Precautions / Restrictions Precautions Precautions: Fall Recall of Precautions/Restrictions: Intact Precaution/Restrictions Comments: watch BP, orthostatic but asymptomatic (declines to wear Rosalynn Pleasant) Restrictions Weight Bearing Restrictions Per Provider Order: No     Mobility  Bed Mobility Overal bed mobility: Modified Independent             General bed mobility comments: HOB slightly elevated.    Transfers Overall transfer level: Needs assistance Equipment used: None Transfers: Sit to/from Stand, Bed to chair/wheelchair/BSC Sit to Stand: Supervision   Step pivot transfers: Supervision       General transfer comment: Pt stood from lowest bed height. He pushed up with BUE support. Transferred to recliner chair. Supervision for safety and IV line management. Pt completed the five time sit to stand and 30-second chair stand test without BUE support; however, appeared to push up from knees.    Ambulation/Gait Ambulation/Gait assistance: Supervision Gait Distance (Feet): 300 Feet Assistive device: IV Pole Gait Pattern/deviations: Step-through pattern Gait velocity: reduced Gait velocity interpretation: 1.31 - 2.62 ft/sec, indicative of limited community ambulator   General Gait Details: Pt ambulated within the hallway pushing IV pole, no truly using it for support. He took short steady steps within the hallway. Pt navigated an incline/decline of a sloped area. He denied SOB, DOE, dizziness, or lightheadedness during gait. Close supervision for safety.   Stairs Stairs: Yes Stairs assistance: Contact guard assist Stair Management: One rail Right, Forwards, Step to pattern Number of Stairs: 2 (x2) General stair comments: Pt ascended/descended two standard height steps twice with unilateral UE support. He ascended leading with RLE and descended leading with LLE. CGA for safety and  IV management.   Wheelchair Mobility     Tilt Bed    Modified  Rankin (Stroke Patients Only)       Balance Overall balance assessment: Needs assistance Sitting-balance support: No upper extremity supported, Feet supported Sitting balance-Leahy Scale: Good     Standing balance support: No upper extremity supported, Single extremity supported, During functional activity Standing balance-Leahy Scale: Fair                              Hotel Manager: Impaired Factors Affecting Communication: Hearing impaired  Cognition Arousal: Alert Behavior During Therapy: WFL for tasks assessed/performed   PT - Cognitive impairments: No apparent impairments                       PT - Cognition Comments: Pt very HOH wears hearing aids, difficult to discern if pt has slowed processing or if this is related to hearing impairment Following commands: Intact      Cueing Cueing Techniques: Verbal cues  Exercises Other Exercises Other Exercises: 5xSTS: 24.02 seconds (from recliner chair, pt pushed up from knees with BUE) Other Exercises: 30-second chair stand: 10 (from recliner chair, pt pushed up from knees with BUE)    General Comments General comments (skin integrity, edema, etc.): Orthostatic vitals taken, (+) but pt asymptomatic. He declined to wear [facility]      Pertinent Vitals/Pain Pain Assessment Pain Assessment: No/denies pain    Home Living                          Prior Function            PT Goals (current goals can now be found in the care plan section) Acute Rehab PT Goals Patient Stated Goal: Return Home Progress towards PT goals: Progressing toward goals    Frequency    Min 2X/week      PT Plan      Co-evaluation              AM-PAC PT 6 Clicks Mobility   Outcome Measure  Help needed turning from your back to your side while in a flat bed without using bedrails?: None Help needed moving from lying on your back to sitting on the side of a flat bed  without using bedrails?: None Help needed moving to and from a bed to a chair (including a wheelchair)?: A Little Help needed standing up from a chair using your arms (e.g., wheelchair or bedside chair)?: A Little Help needed to walk in hospital room?: A Little Help needed climbing 3-5 steps with a railing? : A Little 6 Click Score: 20    End of Session Equipment Utilized During Treatment: Gait belt Activity Tolerance: Patient tolerated treatment well Patient left: in chair;with call bell/phone within reach Nurse Communication: Mobility status PT Visit Diagnosis: Difficulty in walking, not elsewhere classified (R26.2);Dizziness and giddiness (R42)     Time: 8962-8943 PT Time Calculation (min) (ACUTE ONLY): 19 min  Charges:    $Gait Training: 8-22 mins PT General Charges $$ ACUTE PT VISIT: 1 Visit                     Randall SAUNDERS, PT, DPT Acute Rehabilitation Services Office: [phone] Secure Chat Preferred  Delon CHRISTELLA Callander 08/21/2024, 12:48 PM

## 2024-08-21 NOTE — Progress Notes (Signed)
 PHARMACY - ANTICOAGULATION  Pharmacy Consult for heparin  / warfarin Indication: antithrombin III  deficiency and Hx DVT  Allergies[1]  Patient Measurements: Height: 5' 9 (175.3 cm) Weight: 84.6 kg (186 lb 8.2 oz) IBW/kg (Calculated) : 70.7 HEPARIN  DW (KG): 87.3  Vital Signs: Temp: 98.6 F (37 C) (01/15 0812) Temp Source: Oral (01/15 0812) BP: 144/97 (01/15 0812) Pulse Rate: 95 (01/15 0402)  Labs: Recent Labs    08/19/24 0513 08/20/24 0531 08/20/24 0853 08/21/24 0507  HGB 10.8* 11.9*  --   --   HCT 32.4* 35.2*  --   --   PLT 399 431*  --   --   LABPROT 15.9*  --  16.3* 18.7*  INR 1.2  --  1.2 1.5*  HEPARINUNFRC 0.41 0.37  --  0.49  CREATININE 0.87 0.95  --   --     Estimated Creatinine Clearance: 76.5 mL/min (by C-G formula based on SCr of 0.95 mg/dL).   Assessment: 69 y.o. male with admitted with UGIB, h/o AT3 deficiency, Coumadin  resumed 1/12, continues on heparin .  Received vitamin K  5 mg po 1/3.  Heparin  level therapeutic, INR 1.5 today  Goal of Therapy:  Heparin  level 0.3-0.5 units/ml Monitor platelets by anticoagulation protocol: Yes INR 2 to 3   Plan:  Continue Heparin  at 1100 units / hr Repeat Warfarin 7.5 mg po x 1 today Check CBC, INR and HL daily  Thank you. Olam Monte, PharmD 08/21/2024, 8:24 AM                        [1]  Allergies Allergen Reactions   Olmesartan      Hx of severe AKI requiring HD on ACE per renal note, hx of AKI and hypotension with olmesartan  07/2023   Mesalamine Rash    REACTION: Rash

## 2024-08-21 NOTE — Plan of Care (Signed)
   Problem: Clinical Measurements: Goal: Ability to maintain clinical measurements within normal limits will improve Outcome: Progressing Goal: Diagnostic test results will improve Outcome: Progressing   Problem: Activity: Goal: Risk for activity intolerance will decrease Outcome: Progressing

## 2024-08-21 NOTE — TOC Progression Note (Signed)
 Transition of Care [facility]) - Progression Note    Patient Details  Name: Rick Edwards MRN: [id number] Date of Birth: [birth year]  Transition of Care [facility]) CM/SW Contact  Corean JAYSON Canary, RN Phone Number: 08/21/2024, 10:59 AM  Clinical Narrative:     Patient remains in the hospital on heparin  drip, stable H&H will need oncology follow up PT recommending tub bench placed in HUB No further needs identified  Expected Discharge Plan: Home/Self Care Barriers to Discharge: Continued Medical Work up               Expected Discharge Plan and Services   Discharge Planning Services: CM Consult   Living arrangements for the past 2 months: Single Family Home                                       Social Drivers of Health (SDOH) Interventions SDOH Screenings   Food Insecurity: No Food Insecurity (08/08/2024)  Housing: Low Risk (08/08/2024)  Transportation Needs: No Transportation Needs (08/08/2024)  Utilities: Not At Risk (08/08/2024)  Alcohol Screen: Low Risk (06/11/2023)  Depression (PHQ2-9): Low Risk (06/12/2024)  Financial Resource Strain: Low Risk (06/11/2023)  Physical Activity: Sufficiently Active (06/12/2024)  Social Connections: Moderately Integrated (08/08/2024)  Stress: No Stress Concern Present (06/12/2024)  Tobacco Use: Medium Risk (08/11/2024)  Health Literacy: Adequate Health Literacy (06/12/2024)    Readmission Risk Interventions     No data to display

## 2024-08-21 NOTE — Progress Notes (Signed)
 " PROGRESS NOTE    Rick Edwards  FMW:[id number] DOB: [birth year] DOA: 08/07/2024 PCP: Joshua Debby CROME, MD   Brief Narrative:    69 year old male with extensive history of Antithrombin III  deficiency chronically on Coumadin , CKD, Crohn's disease, chronic anemia, hypercalcemic hyperkalemia HTN, ileostomy/takedown 2014, PVD,... Presented to ED with chief complaint of black-colored stool and shortness of breath and was found to have moderate size right pleural effusion and possible GI bleed.    GI consulted. Status post EGD and due to oozing ulcer which was stopped. Cytology from pleural fluid shows adenocarcinoma and biopsy also confirmed adenocarcinoma. Oncology and Pulmonology involved. Details below.    The Oncology team feels that he has a metastatic gastrointestinal malignancy but a lung primary could also be possible.  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 Hgb/Hct Trend now fairly stable:            Seen by GI, status post EGD 08/12/2023, was found to  have oozing duodenal ulcer which was stopped by hemostatic spray.  GI recommended IV PPI to every 6 hours.  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 Medical Oncology also feels the patient has a metastatic gastrointestinal malignancy  -CEA was 119.0 GI plans to repeat EGD if no conclusion with lung biopsy.   -Now on SOFT Diet   Moderate Right Pleural effusion/adenocarcinoma of unknown primary but likely Metastatic Gastrointestinal Malignancy: This is recurrent issue for him.  Underwent thoracentesis by IR 08/09/2024 with a yield of 100 cc fluid.  Fluid analysis consistent with lymphocyte predominant exudative fluid. cytology came back positive for adenocarcinoma unfortunately.   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  Oncology consulted, they are suspecting metastatic gastrointestinal malignancy but could also be a possible pulmonary cancer primary due to left upper lobe nodule; Pulmonary consulted, patient underwent video bronchoscopy with endobronchial ultrasound and electromagnetic navigation procedure 08/14/2024.   -Cytology reveals Adenocarcinoma from Bx; Tx to be sent [facility] for Molecular testing being emailed to [facility] Path tech -In the meantime, CEA came back significantly elevated at 119.0.  Defer further management to oncology and they are recommending outpatient PET Scan  -Repeat CXR 08/18/24: Interval progression of right pleural effusion with decreased aeration of the right lung but he has no SOB  Ordered a  home O2 evaluation.   Orthostatic Drop: Asymptomatic. Added TED Hose.    Antithrombin 3 Deficiency/Supratherapeutic INR:  Hx of DVT -Chronically anticoagulated on Coumadin , INR 2.7 upon arrival.   Received vitamin K  for reversal. Currently remain on Heparin  gtt and resumed Coumadin   Option to transition to Lovenox  bridge as it is quite affordable ($5) but given his Bleeding and Metastatic Adenocarcinoma, opted to C/w Heparin  and Coumadin  to Monitor for any S/Sx of Active bleeding.  INR today is 1.5. Goal INR is between 2-3   Right Renal Mass: Multiple bilateral renal cysts, 1 including 2.1 cm right kidney possible RCC On a CT scan-EDP Dr. Elnor has discussed the findings with on-call Urologist Does not recommend any inpatient further workup -to follow-up as an outpatient.   Type 2 Diabetes Mellitus: Last hemoglobin A1c 6.2, currently not on any medications.    Hypokalemia: prn repletion   Hyponatremia: Last sodium of 134   Stage III CKDa: At Baseline.   History of Crohn's disease: Stable.   PVD: Continue Atorvastatin  40 mg po Daily.   Dyslipidemia: Continue Atorvastatin  40 mg po Daily.  Disposition: Home. IADL  DVT prophylaxis: SCDs Start: 08/08/24 1636 Place TED hose Start: 08/08/24 1636 warfarin (COUMADIN ) tablet 7.5 mg     Code Status: Full Code Family Communication:  None at the bedside Status is: Inpatient Remains inpatient appropriate because: On heparin  drip    Subjective:  No acute events overnight. Denied any active complaints.  Examination:  General exam: Appears calm and comfortable  Respiratory system: Clear to auscultation. Respiratory effort normal. Cardiovascular system: S1 & S2 heard, RRR. No JVD, murmurs, rubs, gallops or clicks. No pedal edema. Gastrointestinal system: Abdomen is nondistended, soft and nontender. No organomegaly or masses felt. Normal bowel sounds heard. Central nervous system: Alert and oriented. No focal neurological deficits. Extremities: Symmetric 5 x 5 power. Skin: No rashes, lesions or ulcers Psychiatry: Judgement and insight appear normal. Mood & affect appropriate.       Diet Orders (From admission, onward)      Start     Ordered   08/18/24 1202  DIET SOFT Room service appropriate? Yes; Fluid consistency: Thin  Diet effective now       Question Answer Comment  Room service appropriate? Yes   Fluid consistency: Thin      08/18/24 1201            Objective: Vitals:   08/20/24 2034 08/21/24 0402 08/21/24 0408 08/21/24 0812  BP: (!) 157/94 (!) 148/90  (!) 144/97  Pulse: (!) 106 95    Resp: 18   18  Temp: 98.5 F (36.9 C) 97.7 F (36.5 C)  98.6 F (37 C)  TempSrc: Oral Oral  Oral  SpO2: 96% 92%  94%  Weight:   84.6 kg   Height:        Intake/Output Summary (Last 24 hours) at 08/21/2024 0934 Last data filed at 08/21/2024 0817 Gross per 24 hour  Intake 331.06 ml  Output 300 ml  Net 31.06 ml   Filed Weights   08/19/24 0600 08/20/24 0506 08/21/24 0408  Weight: 87.5 kg 86.3 kg 84.6 kg    Scheduled Meds:  allopurinol   300 mg Oral Daily   atorvastatin   40 mg Oral Daily   doxazosin   4 mg Oral QHS   melatonin  5 mg Oral QHS   pantoprazole   40 mg Oral BID   senna-docusate  1 tablet Oral BID   sodium chloride  flush  3 mL Intravenous  Q12H   sodium chloride  flush  3 mL Intravenous Q12H   spironolactone   25 mg Oral Daily   warfarin  7.5 mg Oral ONCE-1600   Warfarin - Pharmacist Dosing Inpatient   Does not apply q1600   Continuous Infusions:  heparin  1,100 Units/hr (08/20/24 1305)    Nutritional status     Body mass index is 27.54 kg/m.  Data Reviewed:   CBC: Recent Labs  Lab 08/16/24 0456 08/17/24 0539 08/18/24 0526 08/19/24 0513 08/20/24 0531  WBC 9.2 7.5 7.1 7.3 6.8  NEUTROABS  --  4.9 4.6 4.9 4.1  HGB 9.4* 10.2* 10.7* 10.8* 11.9*  HCT 27.8* 29.9* 32.6* 32.4* 35.2*  MCV 92.4 92.9 93.7 93.1 92.4  PLT 335 397 378 399 431*   Basic Metabolic Panel: Recent Labs  Lab 08/16/24 1005 08/17/24 0539 08/18/24 0526 08/19/24 0513 08/20/24 0531  NA 135 138 136 134* 135  K 3.3* 3.8 4.2 4.1 4.2  CL 99 101 100 98 97*  CO2 28 28 28 29 29   GLUCOSE 144* 90 97 111* 106*   BUN 11 7* <5* 6* 6*  CREATININE 0.98 0.85 0.84 0.87 0.95  CALCIUM  8.5* 8.4* 8.7* 8.8* 9.0  MG 1.8 2.0 1.7 1.8 1.9  PHOS 2.8 3.6 3.8 4.0 3.5   GFR: Estimated Creatinine Clearance: 76.5 mL/min (by C-G formula based on SCr of 0.95 mg/dL). Liver Function Tests: Recent Labs  Lab 08/16/24 1005 08/17/24 0539 08/18/24 0526 08/19/24 0513 08/20/24 0531  AST 30 18 13* 13* 13*  ALT 40 34 27 24 27   ALKPHOS 67 73 72 71 77  BILITOT 0.6 0.7 0.7 0.7 0.7  PROT 5.4* 5.6* 5.5* 5.7* 6.2*  ALBUMIN  3.1* 3.2* 3.1* 3.2* 3.4*   No results for input(s): LIPASE, AMYLASE in the last 168 hours. No results for input(s): AMMONIA in the last 168 hours. Coagulation Profile: Recent Labs  Lab 08/19/24 0513 08/20/24 0853 08/21/24 0507  INR 1.2 1.2 1.5*   Cardiac Enzymes: No results for input(s): CKTOTAL, CKMB, CKMBINDEX, TROPONINI in the last 168 hours. BNP (last 3 results) Recent Labs    08/08/24 1227  PROBNP <50.0   HbA1C: No results for input(s): HGBA1C in the last 72 hours. CBG: Recent Labs  Lab 08/17/24 0746 08/20/24 0718 08/20/24 1227 08/20/24 2033 08/21/24 0816  GLUCAP 125* 102* 129* 125* 149*   Lipid Profile: No results for input(s): CHOL, HDL, LDLCALC, TRIG, CHOLHDL, LDLDIRECT in the last 72 hours. Thyroid  Function Tests: No results for input(s): TSH, T4TOTAL, FREET4, T3FREE, THYROIDAB in the last 72 hours. Anemia Panel: No results for input(s): VITAMINB12, FOLATE, FERRITIN, TIBC, IRON, RETICCTPCT in the last 72 hours. Sepsis Labs: No results for input(s): PROCALCITON, LATICACIDVEN in the last 168 hours.  No results found for this or any previous visit (from the past 240 hours).       Radiology Studies: No results found.         LOS: 12 days   Time spent= 35 mins    Deliliah Room, MD [facility]  If 7PM-7AM, please contact night-coverage  08/21/2024, 9:34 AM  "

## 2024-08-21 NOTE — Plan of Care (Signed)

## 2024-08-22 DIAGNOSIS — K922 Gastrointestinal hemorrhage, unspecified: Secondary | ICD-10-CM | POA: Diagnosis not present

## 2024-08-22 LAB — HEPARIN LEVEL (UNFRACTIONATED): Heparin Unfractionated: 0.51 [IU]/mL (ref 0.30–0.70)

## 2024-08-22 LAB — GLUCOSE, CAPILLARY
Glucose-Capillary: 168 mg/dL — ABNORMAL HIGH (ref 70–99)
Glucose-Capillary: 134 mg/dL — ABNORMAL HIGH (ref 70–99)
Glucose-Capillary: 116 mg/dL — ABNORMAL HIGH (ref 70–99)
Glucose-Capillary: 114 mg/dL — ABNORMAL HIGH (ref 70–99)

## 2024-08-22 LAB — PROTIME-INR
INR: 1.6 — ABNORMAL HIGH (ref 0.8–1.2)
Prothrombin Time: 20.2 s — ABNORMAL HIGH (ref 11.4–15.2)

## 2024-08-22 MED ORDER — WARFARIN SODIUM 5 MG PO TABS
10.0000 mg | ORAL_TABLET | Freq: Once | ORAL | Status: AC
  Administered 2024-08-22: 10 mg via ORAL
  Filled 2024-08-22: qty 2

## 2024-08-22 MED ORDER — GUAIFENESIN-DM 100-10 MG/5ML PO SYRP
5.0000 mL | ORAL_SOLUTION | ORAL | Status: DC | PRN
  Administered 2024-08-22: 5 mL via ORAL
  Filled 2024-08-22: qty 5

## 2024-08-22 NOTE — Progress Notes (Signed)
 PHARMACY - ANTICOAGULATION  Pharmacy Consult for heparin  / warfarin Indication: antithrombin III  deficiency and Hx DVT  Allergies[1]  Patient Measurements: Height: 5' 9 (175.3 cm) Weight: 86.5 kg (190 lb 11.2 oz) (1 blanket,1 sheet,1 pillow in bed at the time of weight) IBW/kg (Calculated) : 70.7 HEPARIN  DW (KG): 87.3  Vital Signs: Temp: 97.6 F (36.4 C) (01/16 0831) Temp Source: Oral (01/16 0831) BP: 121/76 (01/16 0831) Pulse Rate: 100 (01/16 0831)  Labs: Recent Labs    08/20/24 0531 08/20/24 0853 08/21/24 0507 08/22/24 0451  HGB 11.9*  --   --   --   HCT 35.2*  --   --   --   PLT 431*  --   --   --   LABPROT  --  16.3* 18.7* 20.2*  INR  --  1.2 1.5* 1.6*  HEPARINUNFRC 0.37  --  0.49 0.51  CREATININE 0.95  --   --   --     Estimated Creatinine Clearance: 83.3 mL/min (by C-G formula based on SCr of 0.95 mg/dL).   Assessment: 69 y.o. male with admitted with UGIB, h/o AT3 deficiency, Coumadin  resumed 1/12, continues on heparin .  Received vitamin K  5 mg PO 1/03.  PTA warfarin from 08/05/24 [facility] clinic note:  5 mg every Mon, Wed, Fri 7.5 mg all other days  Heparin  level therapeutic, INR 1.6 today.   Goal of Therapy:  Heparin  level 0.3-0.5 units/ml Monitor platelets by anticoagulation protocol: Yes INR 2 to 3   Plan:  Give warfarin 10 mg PO x1 dose Empirically decrease heparin  infusion to 1050 units/hr  Check INR daily while on warfarin  Check heparin  level daily while on heparin  Continue to monitor H&H and platelets   Thank you for allowing pharmacy to be a part of this patients care.  Shelba Collier, PharmD, BCPS Clinical Pharmacist    [1]  Allergies Allergen Reactions   Olmesartan      Hx of severe AKI requiring HD on ACE per renal note, hx of AKI and hypotension with olmesartan  07/2023   Mesalamine Rash    REACTION: Rash

## 2024-08-22 NOTE — Progress Notes (Signed)
 Occupational Therapy Treatment Patient Details Name: Rick Edwards MRN: [id number] DOB: [birth year] Today's Date: 08/22/2024   History of present illness 69 y.o. male presented to ED 08/07/24 with chief complaint of black-colored stool. +GIB, R pleural effusion, R renal mass, 1/3 thoracentesis with vagal episode with BP 76/50, cytology positive for adenocarcinoma. Bronch with LUL nodule biopsy on 1/8. PMH significant of: Antithrombin III  deficiency chronically on Coumadin , CKD, Crohn's disease, chronic anemia, hypercalcemic hyperkalemia HTN, ileostomy/takedown 2014, PVD, DM.   OT comments  Focus of session on educating pt in use of tub transfer bench so pt may resume showering instead of sponge bathing. Demonstrated transfer. Pt with complaint of abdominal pain, declined walking down hall or remaining up in chair at end of session. Pt without s/s of orthostatic hypotension. Educated in sitting and standing momentarily prior to walking and to self monitor for symptoms and have places he can sit in his home. Pt continues to decline use of compression socks.       If plan is discharge home, recommend the following:  A little help with bathing/dressing/bathroom;Assistance with cooking/housework;Help with stairs or ramp for entrance   Equipment Recommendations  Tub/shower bench    Recommendations for Other Services      Precautions / Restrictions Precautions Precautions: Fall Recall of Precautions/Restrictions: Intact Precaution/Restrictions Comments: watch BP, orthostatic but asymptomatic (declines wearing TED hose)       Mobility Bed Mobility Overal bed mobility: Modified Independent             General bed mobility comments: HOB slightly elevated.    Transfers Overall transfer level: Needs assistance Equipment used: None Transfers: Sit to/from Stand Sit to Stand: Supervision           General transfer comment: supervision for line management     Balance Overall  balance assessment: Needs assistance   Sitting balance-Leahy Scale: Good     Standing balance support: No upper extremity supported, Single extremity supported, During functional activity Standing balance-Leahy Scale: Fair                             ADL either performed or assessed with clinical judgement   ADL Overall ADL's : Needs assistance/impaired                 Upper Body Dressing : Set up;Sitting               Tub/ Shower Transfer: Supervision/safety;Ambulation;Tub bench Tub/Shower Transfer Details (indicate cue type and reason): brought tub bench to pt's room and educated in use, instructed in fall prevention by leaning side to side to reach buttocks Functional mobility during ADLs: Supervision/safety (IV pole)      Extremity/Trunk Assessment              Vision       Perception     Praxis     Communication Communication Communication: Impaired Factors Affecting Communication: Hearing impaired   Cognition Arousal: Alert Behavior During Therapy: WFL for tasks assessed/performed Cognition: No apparent impairments                               Following commands: Intact        Cueing   Cueing Techniques: Verbal cues  Exercises      Shoulder Instructions       General Comments      Pertinent Vitals/ Pain  Pain Assessment Pain Assessment: Faces Faces Pain Scale: Hurts even more Pain Location: R side of abdomen Pain Descriptors / Indicators: Discomfort, Sore, Grimacing Pain Intervention(s): Monitored during session, RN gave pain meds during session  Home Living                                          Prior Functioning/Environment              Frequency  Min 2X/week        Progress Toward Goals  OT Goals(current goals can now be found in the care plan section)  Progress towards OT goals: Progressing toward goals  Acute Rehab OT Goals OT Goal Formulation: With  patient Time For Goal Achievement: 09/03/24 Potential to Achieve Goals: Good  Plan      Co-evaluation                 AM-PAC OT 6 Clicks Daily Activity     Outcome Measure   Help from another person eating meals?: None Help from another person taking care of personal grooming?: A Little Help from another person toileting, which includes using toliet, bedpan, or urinal?: A Little Help from another person bathing (including washing, rinsing, drying)?: A Little Help from another person to put on and taking off regular upper body clothing?: A Little Help from another person to put on and taking off regular lower body clothing?: A Little 6 Click Score: 19    End of Session Equipment Utilized During Treatment: Gait belt  OT Visit Diagnosis: Unsteadiness on feet (R26.81);Muscle weakness (generalized) (M62.81);Pain   Activity Tolerance Patient limited by pain   Patient Left in bed;with call bell/phone within reach   Nurse Communication          Time: 9099-9078 OT Time Calculation (min): 21 min  Charges: OT General Charges $OT Visit: 1 Visit OT Treatments $Self Care/Home Management : 8-22 mins  Mliss HERO, OTR/L Acute Rehabilitation Services Office: [phone]   Kennth Mliss Helling 08/22/2024, 9:28 AM

## 2024-08-22 NOTE — Plan of Care (Signed)
  Problem: Health Behavior/Discharge Planning: Goal: Ability to manage health-related needs will improve Outcome: Progressing   Problem: Clinical Measurements: Goal: Will remain free from infection Outcome: Progressing Goal: Diagnostic test results will improve Outcome: Progressing Goal: Cardiovascular complication will be avoided Outcome: Progressing   

## 2024-08-22 NOTE — Progress Notes (Signed)
 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 Presented to ED with chief complaint of black-colored stool and shortness of breath and was found to have moderate size right pleural effusion and possible GI bleed.    GI consulted. Status post EGD and due to oozing ulcer which was stopped. Cytology from pleural fluid shows adenocarcinoma and biopsy also confirmed adenocarcinoma. Oncology and Pulmonology involved. Details below.    The Oncology team feels that he has a metastatic gastrointestinal malignancy but a lung primary could also be possible.  They recommend no further workup at this time and diet is being advanced and given that he was 7 days out from his acute blood loss anemia with fairly stable hemoglobin   Coumadin  resumed on 1/12 On heparin  drip. 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 Hgb/Hct Trend now fairly stable:            Seen by GI, status post EGD  08/12/2023, was found to have oozing duodenal ulcer which was stopped by hemostatic spray.  GI recommended IV PPI to every 6 hours.  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 Medical Oncology also feels the patient has a metastatic gastrointestinal malignancy  -CEA was 119.0 GI plans to repeat EGD if no conclusion with lung biopsy.   -Now on SOFT Diet   Moderate Right Pleural effusion/adenocarcinoma of unknown primary but likely Metastatic Gastrointestinal Malignancy: This is recurrent issue for him.  Underwent thoracentesis by IR 08/09/2024 with a yield of 100 cc fluid.  Fluid analysis consistent with lymphocyte predominant exudative fluid. cytology came back positive for adenocarcinoma unfortunately.   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  Oncology consulted, they are suspecting metastatic gastrointestinal malignancy but could also be a possible pulmonary cancer primary due to left upper lobe nodule; Pulmonary consulted, patient underwent video bronchoscopy with endobronchial ultrasound and electromagnetic navigation procedure 08/14/2024.   -Cytology reveals Adenocarcinoma from Bx; Tx to be sent [facility] for Molecular testing being emailed to [facility] Path tech -In the meantime, CEA came back significantly elevated at 119.0.  Defer further management to oncology and they are recommending outpatient PET Scan  -Repeat CXR 08/18/24: Interval progression of right pleural effusion with decreased aeration of the right lung but he has no  SOB   Orthostatic Drop: Asymptomatic. Added TED Hose.    Antithrombin 3 Deficiency/Supratherapeutic INR:  Hx of DVT -Chronically anticoagulated on Coumadin , INR 2.7 upon arrival.  Received  vitamin K  for reversal. Currently remain on Heparin  gtt and resumed Coumadin   Option to transition to Lovenox  bridge as it is quite affordable ($5) but given his Bleeding and Metastatic Adenocarcinoma, opted to C/w Heparin  and Coumadin  to Monitor for any S/Sx of Active bleeding.  INR today is 1.6. Goal INR is between 2-3. Received warfarin 7.5 mg on 1/15 and will be given 10 mg today. F/u INR in am.   Right Renal Mass: Multiple bilateral renal cysts, 1 including 2.1 cm right kidney possible RCC On a CT scan-EDP Dr. Elnor has discussed the findings with on-call Urologist Does not recommend any inpatient further workup -to follow-up as an outpatient.   Type 2 Diabetes Mellitus: Last hemoglobin A1c 6.2, currently not on any medications.    Hypokalemia: prn repletion   Hyponatremia: Last sodium of 134   Stage III CKDa: At Baseline.   History of Crohn's disease: Stable.   PVD: Continue Atorvastatin  40 mg po Daily.   Dyslipidemia: Continue Atorvastatin  40 mg po Daily.  Disposition: Home. IADL  DVT prophylaxis: SCDs Start: 08/08/24 1636 Place TED hose Start: 08/08/24 1636 warfarin (COUMADIN ) tablet 10 mg     Code Status: Full Code Family Communication:  None at the bedside Status is: Inpatient Remains inpatient appropriate because: On heparin  drip    Subjective:  No acute events overnight. Denied any active complaints except coughing spell at night. Spoke to him about his INR of 1.6 today and the need to continue heparin  drip.   Examination:  General exam: Appears calm and comfortable  Respiratory system: Clear to auscultation. Respiratory effort normal. Cardiovascular system: S1 & S2 heard, RRR. No JVD, murmurs, rubs, gallops or clicks. No pedal edema. Gastrointestinal system: Abdomen is nondistended, soft and nontender. No organomegaly or masses felt. Normal bowel sounds heard. Central nervous system: Alert and oriented. No focal neurological deficits. Extremities: Symmetric 5 x  5 power. Skin: No rashes, lesions or ulcers Psychiatry: Judgement and insight appear normal. Mood & affect appropriate.       Diet Orders (From admission, onward)     Start     Ordered   08/18/24 1202  DIET SOFT Room service appropriate? Yes; Fluid consistency: Thin  Diet effective now       Question Answer Comment  Room service appropriate? Yes   Fluid consistency: Thin      08/18/24 1201            Objective: Vitals:   08/21/24 2027 08/22/24 0441 08/22/24 0613 08/22/24 0831  BP: 121/81 (!) 149/88  121/76  Pulse: 100 (!) 101  100  Resp: 20 19  18   Temp: 99.1 F (37.3 C) 97.7 F (36.5 C)  97.6 F (36.4 C)  TempSrc: Oral Oral  Oral  SpO2: 95% 100%  94%  Weight:   86.5 kg   Height:        Intake/Output Summary (Last 24 hours) at 08/22/2024 1001 Last data filed at 08/21/2024 1925 Gross per 24 hour  Intake 100 ml  Output 500 ml  Net -400 ml   Filed Weights   08/20/24 0506 08/21/24 0408 08/22/24 0613  Weight: 86.3 kg 84.6 kg 86.5 kg    Scheduled Meds:  allopurinol   300 mg Oral Daily   atorvastatin   40 mg Oral Daily   doxazosin   4 mg  Oral QHS   melatonin  5 mg Oral QHS   pantoprazole   40 mg Oral BID   senna-docusate  1 tablet Oral BID   sodium chloride  flush  3 mL Intravenous Q12H   sodium chloride  flush  3 mL Intravenous Q12H   spironolactone   25 mg Oral Daily   warfarin  10 mg Oral ONCE-1600   Warfarin - Pharmacist Dosing Inpatient   Does not apply q1600   Continuous Infusions:  heparin  1,050 Units/hr (08/22/24 0957)    Nutritional status     Body mass index is 28.16 kg/m.  Data Reviewed:   CBC: Recent Labs  Lab 08/16/24 0456 08/17/24 0539 08/18/24 0526 08/19/24 0513 08/20/24 0531  WBC 9.2 7.5 7.1 7.3 6.8  NEUTROABS  --  4.9 4.6 4.9 4.1  HGB 9.4* 10.2* 10.7* 10.8* 11.9*  HCT 27.8* 29.9* 32.6* 32.4* 35.2*  MCV 92.4 92.9 93.7 93.1 92.4  PLT 335 397 378 399 431*   Basic Metabolic Panel: Recent Labs  Lab 08/16/24 1005 08/17/24 0539  08/18/24 0526 08/19/24 0513 08/20/24 0531  NA 135 138 136 134* 135  K 3.3* 3.8 4.2 4.1 4.2  CL 99 101 100 98 97*  CO2 28 28 28 29 29   GLUCOSE 144* 90 97 111* 106*  BUN 11 7* <5* 6* 6*  CREATININE 0.98 0.85 0.84 0.87 0.95  CALCIUM  8.5* 8.4* 8.7* 8.8* 9.0  MG 1.8 2.0 1.7 1.8 1.9  PHOS 2.8 3.6 3.8 4.0 3.5   GFR: Estimated Creatinine Clearance: 83.3 mL/min (by C-G formula based on SCr of 0.95 mg/dL). Liver Function Tests: Recent Labs  Lab 08/16/24 1005 08/17/24 0539 08/18/24 0526 08/19/24 0513 08/20/24 0531  AST 30 18 13* 13* 13*  ALT 40 34 27 24 27   ALKPHOS 67 73 72 71 77  BILITOT 0.6 0.7 0.7 0.7 0.7  PROT 5.4* 5.6* 5.5* 5.7* 6.2*  ALBUMIN  3.1* 3.2* 3.1* 3.2* 3.4*   No results for input(s): LIPASE, AMYLASE in the last 168 hours. No results for input(s): AMMONIA in the last 168 hours. Coagulation Profile: Recent Labs  Lab 08/19/24 0513 08/20/24 0853 08/21/24 0507 08/22/24 0451  INR 1.2 1.2 1.5* 1.6*   Cardiac Enzymes: No results for input(s): CKTOTAL, CKMB, CKMBINDEX, TROPONINI in the last 168 hours. BNP (last 3 results) Recent Labs    08/08/24 1227  PROBNP <50.0   HbA1C: No results for input(s): HGBA1C in the last 72 hours. CBG: Recent Labs  Lab 08/20/24 2033 08/21/24 0816 08/21/24 1124 08/21/24 2050 08/22/24 0828  GLUCAP 125* 149* 134* 141* 168*   Lipid Profile: No results for input(s): CHOL, HDL, LDLCALC, TRIG, CHOLHDL, LDLDIRECT in the last 72 hours. Thyroid  Function Tests: No results for input(s): TSH, T4TOTAL, FREET4, T3FREE, THYROIDAB in the last 72 hours. Anemia Panel: No results for input(s): VITAMINB12, FOLATE, FERRITIN, TIBC, IRON, RETICCTPCT in the last 72 hours. Sepsis Labs: No results for input(s): PROCALCITON, LATICACIDVEN in the last 168 hours.  No results found for this or any previous visit (from the past 240 hours).       Radiology Studies: No results  found.         LOS: 13 days   Time spent= 35 mins    Deliliah Room, MD [facility]  If 7PM-7AM, please contact night-coverage  08/22/2024, 10:01 AM  "

## 2024-08-23 DIAGNOSIS — K922 Gastrointestinal hemorrhage, unspecified: Secondary | ICD-10-CM | POA: Diagnosis not present

## 2024-08-23 LAB — PROTIME-INR
INR: 2.2 — ABNORMAL HIGH (ref 0.8–1.2)
Prothrombin Time: 25.5 s — ABNORMAL HIGH (ref 11.4–15.2)

## 2024-08-23 LAB — HEPARIN LEVEL (UNFRACTIONATED): Heparin Unfractionated: 0.47 [IU]/mL (ref 0.30–0.70)

## 2024-08-23 LAB — GLUCOSE, CAPILLARY: Glucose-Capillary: 124 mg/dL — ABNORMAL HIGH (ref 70–99)

## 2024-08-23 MED ORDER — OXYCODONE HCL 5 MG PO TABS: 5.0000 mg | ORAL_TABLET | Freq: Three times a day (TID) | ORAL | 0 refills | Status: DC | PRN

## 2024-08-23 MED ORDER — WARFARIN SODIUM 5 MG PO TABS: ORAL_TABLET | ORAL | 1 refills | Status: AC

## 2024-08-23 MED ORDER — WARFARIN SODIUM 5 MG PO TABS: 7.5000 mg | ORAL_TABLET | Freq: Once | ORAL | Status: DC

## 2024-08-23 NOTE — Progress Notes (Addendum)
 PHARMACY - ANTICOAGULATION  Pharmacy Consult for heparin  / warfarin Indication: antithrombin III  deficiency and Hx DVT  Allergies[1]  Patient Measurements: Height: 5' 9 (175.3 cm) Weight: 86.3 kg (190 lb 4.1 oz) IBW/kg (Calculated) : 70.7 HEPARIN  DW (KG): 87.3  Vital Signs: Temp: 98.5 F (36.9 C) (01/17 0448) Temp Source: Oral (01/17 0448) BP: 134/82 (01/17 0448) Pulse Rate: 89 (01/17 0448)  Labs: Recent Labs    08/21/24 0507 08/22/24 0451 08/23/24 0443  LABPROT 18.7* 20.2* 25.5*  INR 1.5* 1.6* 2.2*  HEPARINUNFRC 0.49 0.51 0.47    Estimated Creatinine Clearance: 83.2 mL/min (by C-G formula based on SCr of 0.95 mg/dL).   Assessment: 69 y.o. male with admitted with UGIB, h/o AT3 deficiency, Coumadin  resumed 1/12, continues on heparin .  Received vitamin K  5 mg PO 1/03.  PTA warfarin from 08/05/24 [facility] clinic note:  5 mg every Mon, Wed, Fri 7.5 mg all other days  1/17 - Heparin  level is therapeutic at 0.47 on 1050 units/hr. INR is also therapeutic at 2.2. No signs of bleeding or pauses to the heparin  infusion noted.  Goal of Therapy:  Heparin  level 0.3-0.5 units/ml Monitor platelets by anticoagulation protocol: Yes INR 2 to 3   Plan:  Give warfarin 7.5 mg PO x1 dose (PTA regimen) Ok to stop heparin  infusion since INR therapeutic Monitor INR daily while on warfarin  Continue to monitor H&H and platelets   Thank you for allowing pharmacy to be a part of this patients care.  WENDI Amon Rocher, PharmD PGY-1 Pharmacy Resident [facility] 08/23/2024 7:10 AM      [1]  Allergies Allergen Reactions   Olmesartan      Hx of severe AKI requiring HD on ACE per renal note, hx of AKI and hypotension with olmesartan  07/2023   Mesalamine Rash    REACTION: Rash

## 2024-08-23 NOTE — Discharge Instructions (Signed)
 [name]

## 2024-08-23 NOTE — Plan of Care (Incomplete)
" °  Problem: Education: Goal: Knowledge of General Education information will improve Description: Including pain rating scale, medication(s)/side effects and non-pharmacologic comfort measures Outcome: Adequate for Discharge   Problem: Health Behavior/Discharge Planning: Goal: Ability to manage health-related needs will improve Outcome: Adequate for Discharge   Problem: Clinical Measurements: Goal: Ability to maintain clinical measurements within normal limits will improve Outcome: Adequate for Discharge Goal: Will remain free from infection Outcome: Adequate for Discharge Goal: Diagnostic test results will improve Outcome: Adequate for Discharge Goal: Respiratory complications will improve Outcome: Adequate for Discharge Goal: Cardiovascular complication will be avoided Outcome: Adequate for Discharge   Problem: Activity: Goal: Risk for activity intolerance will decrease Outcome: Adequate for Discharge   Problem: Nutrition: Goal: Adequate nutrition will be maintained Outcome: Adequate for Discharge   Problem: Coping: Goal: Level of anxiety will decrease Outcome: Adequate for Discharge   Problem: Elimination: Goal: Will not experience complications related to bowel motility Outcome: Adequate for Discharge   Problem: Safety: Goal: Ability to remain free from injury will improve Outcome: Adequate for Discharge   Problem: Skin Integrity: Goal: Risk for impaired skin integrity will decrease Outcome: Adequate for Discharge  Pt A&OX4, BP (!) 142/84   Pulse (!) 103   Temp 98.7 F (37.1 C) (Oral)   Resp 17   Ht 5' 9 (1.753 m)   Wt 86.3 kg   SpO2 95%   BMI 28.10 kg/m  IV removed, personal belongings returned. AVS reviewed, all follow up questions answered, no other needs voiced at this time.  Rick Edwards 08/23/24  "

## 2024-08-23 NOTE — Discharge Summary (Addendum)
 " Physician Discharge Summary   Patient: Rick Edwards MRN: [id number] DOB: 18-Apr-1956  Admit date:     08/07/2024  Discharge date: 08/23/24  Discharge Physician: Deliliah Room   PCP: Joshua Debby CROME, MD   Recommendations at discharge:    F/u with PCP in one week F/u with oncology in 1-2 weeks. Call to make an appointment F/u with pulmonology in 3-4 weeks F/u with coumadin  clinic in a week. Call to schedule an appointment.  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    GI consulted. Status post EGD and due to oozing ulcer which was stopped. Cytology from pleural fluid shows adenocarcinoma and biopsy also confirmed adenocarcinoma. Oncology and Pulmonology involved. Details below.    The Oncology team feels that he has a metastatic gastrointestinal malignancy but a lung primary could also be possible.  They recommend no further workup at this time and diet is being advanced and given that he was 7 days out from his acute blood loss anemia with fairly stable hemoglobin    Coumadin  resumed on 1/12  Acute  Blood Loss Anemia 2/2 to duodenal ulcer/upper GI bleed, POA:                    Seen by GI, status post EGD 08/12/2023, was found to have oozing duodenal ulcer which was stopped by hemostatic spray.  GI recommended IV PPI to every 6 hours.  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 Medical Oncology also feels the patient has a metastatic gastrointestinal malignancy  -Tolerated SOFT Diet   Moderate Right Pleural effusion/adenocarcinoma of unknown primary but likely Metastatic Gastrointestinal Malignancy: This is recurrent issue for him.  Underwent thoracentesis by IR 08/09/2024 with a yield of 100 cc fluid.  Fluid analysis consistent with lymphocyte predominant exudative fluid. cytology came back positive for adenocarcinoma unfortunately.   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  Oncology consulted, they are suspecting metastatic gastrointestinal malignancy but could also be a possible pulmonary cancer primary due to left upper lobe nodule; Pulmonary consulted, patient underwent video bronchoscopy with endobronchial ultrasound and electromagnetic navigation procedure 08/14/2024.   -Cytology reveals Adenocarcinoma from Bx; Tx to be sent [facility] for Molecular testing being emailed to [facility] Path tech -In the meantime, CEA came back significantly elevated at 119.0.  Defer further management to oncology and they are recommending outpatient PET Scan  -Repeat CXR 08/18/24: Interval progression of right pleural effusion with decreased aeration of the right lung but he has no SOB Outpatient f/u with oncology and pulmonology.   Orthostatic Drop: Asymptomatic.  Resolved   Antithrombin 3 Deficiency/Supratherapeutic INR:  Hx of DVT -Chronically anticoagulated on Coumadin , INR 2.7 upon arrival.  Received vitamin K  for reversal.  INR today is 2.2. Goal INR is  between 2-3. Received warfarin 7.5 mg on 1/15 and 10 mg on 1/16. Discharged on PTA Coumadin  regimen and advised to schedule appointment with coumadin  clinic next week. No evidence of bleeding on discharge.   Right Renal Mass: Multiple bilateral renal cysts, 1 including 2.1 cm right kidney possible RCC On a CT scan-EDP Dr. Elnor has discussed the findings with on-call Urologist Does not recommend any inpatient further workup -to follow-up as an outpatient.   Type 2 Diabetes Mellitus: Last hemoglobin A1c 6.2, currently not on any medications.    Hypokalemia: repleted   Hyponatremia: Last sodium of 134   Stage III CKDa: At Baseline.   History of Crohn's disease: Stable.   PVD: Continue Atorvastatin  40 mg po Daily.   Dyslipidemia: Continue Atorvastatin  40 mg po Daily.   Disposition: Home. IADL  Spoke to his sister, Alisa on the phone on 1/17 and advised to schedule appointments with PCP, Pulm, Oncology and coumadin  clinic.      Consultants: GI, Oncology, Pulm, Pharmacy Procedures performed: EGD 1/5, Thoracentesis on 1/3, video bronchoscopy with endobronchial ultrasound and electromagnetic navigation procedure 08/14/2024.  Disposition: Home Diet recommendation:  Cardiac diet DISCHARGE MEDICATION: Allergies as of 08/23/2024       Reactions   Olmesartan     Hx of severe AKI requiring HD on ACE per renal note, hx of AKI and hypotension with olmesartan  07/2023   Mesalamine Rash   REACTION: Rash        Medication List     STOP taking these medications    amoxicillin -clavulanate 875-125 MG tablet Commonly known as: AUGMENTIN    hyoscyamine 0.125 MG SL tablet Commonly known as: LEVSIN SL   triamcinolone  cream 0.5 % Commonly known as: KENALOG    Vascepa  1 g capsule Generic drug: icosapent  Ethyl       TAKE these medications    albuterol  108 (90 Base) MCG/ACT inhaler Commonly known as: VENTOLIN  HFA Inhale 1-2 puffs into the lungs every 6 (six) hours as needed for wheezing  or shortness of breath.   allopurinol  300 MG tablet Commonly known as: ZYLOPRIM  TAKE 1 TABLET BY MOUTH EVERY DAY   amLODipine  10 MG tablet Commonly known as: NORVASC  TAKE 1 TABLET BY MOUTH EVERYDAY AT BEDTIME   atorvastatin  40 MG tablet Commonly known as: LIPITOR TAKE 1 TABLET BY MOUTH EVERY DAY   carvedilol  25 MG tablet Commonly known as: COREG  TAKE 1 TABLET (25 MG TOTAL) BY MOUTH TWICE A DAY WITH MEALS   doxazosin  4 MG tablet Commonly known as: CARDURA  Take 1 tablet (4 mg total) by mouth at bedtime.   omega-3 acid ethyl esters 1 g capsule Commonly known as: LOVAZA  Take 2 g by mouth 2 (two) times daily.   oxyCODONE  5 MG immediate release tablet Commonly known as: Roxicodone  Take 1 tablet (5 mg total) by mouth every 8 (eight) hours as needed for severe pain (pain score 7-10).   spironolactone  25 MG tablet Commonly known as: ALDACTONE  Take 1 tablet (25 mg total) by mouth daily.   warfarin 5 MG tablet Commonly known as: COUMADIN  Take as directed. If you are unsure how to take this medication, talk to your nurse or doctor. Original instructions: TAKE 1 TABLET BY MOUTH DAILY ON MONDAY, WEDNESDAY, AND FRIDAY. TAKE 1 and 1/2 TABLETS ON ALL OTHER DAYS OR AS  DIRECTED BY CLINIC. What changed: See the new instructions.               Durable Medical Equipment  (From admission, onward)           Start     Ordered   08/21/24 1051  For home use only DME Tub bench  Once        08/21/24 1050            Follow-up Information     Joshua Debby CROME, MD. Schedule an appointment as soon as possible for a visit in 1 week(s).   Specialty: Internal Medicine Contact information: [address] [place] [address] [phone]         Federico Norleen ONEIDA MADISON, MD. Schedule an appointment as soon as possible for a visit in 2 week(s).   Specialty: Hematology and Oncology Contact information: [address] [place] [address] [phone]         [facility]. Schedule an appointment as soon as possible for a visit in 1 month(s).   Specialty: Pulmonology Contact information: [address] [place] [address] [place]  [address] [phone] Additional information: [address]  [address]  [place], [place] [address]               Discharge Exam: Fredricka Weights   08/21/24 0408 08/22/24 0613 08/23/24 0554  Weight: 84.6 kg 86.5 kg 86.3 kg   Constitutional: NAD, calm, comfortable Eyes: PERRL, lids and conjunctivae normal ENMT: Mucous membranes are moist. Posterior pharynx clear of any exudate or lesions.Normal dentition.  Neck: normal, supple, no masses, no thyromegaly Respiratory: clear to auscultation bilaterally, no wheezing, no crackles. Normal respiratory effort. No accessory muscle use.  Cardiovascular: Regular rate and rhythm, no murmurs / rubs / gallops. No extremity edema. 2+ pedal pulses. No carotid bruits.  Abdomen: no tenderness, no masses palpated. No hepatosplenomegaly. Bowel sounds positive.  Musculoskeletal: no clubbing / cyanosis. No joint deformity upper and lower extremities. Good ROM, no contractures. Normal muscle tone.  Skin: no rashes, lesions, ulcers. No induration Neurologic: CN 2-12 grossly intact. Sensation intact, DTR normal. Strength 5/5 x all 4 extremities.  Psychiatric: Normal judgment and insight. Alert and oriented x 3. Normal mood.    Condition at discharge: good  The results of significant diagnostics from this hospitalization (including imaging, microbiology, ancillary and laboratory) are listed below for reference.   Imaging Studies: DG CHEST PORT 1 VIEW Result Date: 08/18/2024 CLINICAL DATA:  Shortness of breath. EXAM: PORTABLE CHEST 1 VIEW COMPARISON:  Chest radiograph dated 08/14/2024. FINDINGS: Interval progression of right pleural effusion with compressive atelectasis of the majority of the right lung. Pneumonia or underlying mass are not  excluded. Overall decreased aeration of the right lung. No pneumothorax. Stable cardiac silhouette. No acute osseous pathology. IMPRESSION: Interval progression of right pleural effusion with decreased aeration of the right lung. Electronically Signed   By: Vanetta Chou M.D.   On: 08/18/2024 18:32   DG Chest Port 1 View Result Date: 08/14/2024 EXAM: 1 VIEW(S) XRAY OF THE CHEST 08/14/2024 05:26:00 PM COMPARISON: 08/09/2024 CLINICAL HISTORY: S/P bronchoscopy FINDINGS: LUNGS AND PLEURA: Moderate right pleural effusion with interval increase. Stable right lung base opacity. No pneumothorax. HEART AND MEDIASTINUM: No acute abnormality of the cardiac and mediastinal silhouettes. BONES AND SOFT TISSUES: No acute osseous abnormality. IMPRESSION: 1. Moderate right pleural effusion with interval increase. 2. Stable right lung base opacity likely pneumonia and/or compressive  atelectasis . Electronically signed by: Elsie Gravely MD 08/14/2024 05:40 PM EST RP Workstation: [id number]   DG C-ARM BRONCHOSCOPY Result Date: 08/14/2024 C-ARM BRONCHOSCOPY: Fluoroscopy was utilized by the requesting physician.  No radiographic interpretation.   US  THORACENTESIS ASP PLEURAL SPACE W/IMG GUIDE Result Date: 08/09/2024 EXAM: ULTRASOUND GUIDED RIGHT THORACENTESIS MEDICATIONS: 15 ml 1% lidocaine  COMPLICATIONS: SIR Level A - No therapy, no consequence. During procedure, patient experienced vagal response. BP dropped to 76/50 and patient reported feeling anxious and dizzy. HR 85, spO2 98% at that time. Catheter was removed and patient was assisted to lie down. After lying down, patient experienced relief and BP improved to 131/76. PROCEDURE: An ultrasound guided thoracentesis was thoroughly discussed with the patient and questions answered. The benefits, risks, alternatives and complications were also discussed. The patient understands and wishes to proceed with the procedure. Written consent was obtained. Ultrasound was performed to  localize and mark an adequate pocket of fluid in the right chest. The area was then prepped and draped in the normal sterile fashion. 1% Lidocaine  was used for local anesthesia. Under ultrasound guidance a 6 Fr Safe-T-Centesis catheter was introduced. Thoracentesis was performed. The catheter was removed and a dressing applied. FINDINGS: A total of approximately 100 milliliters of amber fluid was removed. Samples were sent to the laboratory as requested by the clinical team. IMPRESSION: Successful ultrasound guided right thoracentesis yielding 100 milliliters of pleural fluid. Performed by Laymon Coast, NP under the supervision of Dr. Jenna. Electronically Signed   By: Cordella Jenna   On: 08/09/2024 11:25   DG Chest Port 1 View Result Date: 08/09/2024 EXAM: 1 VIEW(S) XRAY OF THE CHEST 08/09/2024 09:33:18 AM COMPARISON: 08/07/2024 CLINICAL HISTORY: Status post thoracentesis FINDINGS: LUNGS AND PLEURA: Slight decrease in moderate right pleural effusion. Similar airspace opacity at the right lung base. No pneumothorax. HEART AND MEDIASTINUM: No acute abnormality of the cardiac and mediastinal silhouettes. BONES AND SOFT TISSUES: No acute osseous abnormality. IMPRESSION: 1. Slight decrease in moderate right pleural effusion with similar right basilar airspace opacity. Electronically signed by: Norman Gatlin MD 08/09/2024 09:47 AM EST RP Workstation: [id number]   CT CHEST ABDOMEN PELVIS W CONTRAST Result Date: 08/08/2024 CLINICAL DATA:  Acute generalized abdominal pain. EXAM: CT CHEST, ABDOMEN, AND PELVIS WITH CONTRAST TECHNIQUE: Multidetector CT imaging of the chest, abdomen and pelvis was performed following the standard protocol during bolus administration of intravenous contrast. RADIATION DOSE REDUCTION: This exam was performed according to the departmental dose-optimization program which includes automated exposure control, adjustment of the mA and/or kV according to patient size and/or use of  iterative reconstruction technique. CONTRAST:  75mL OMNIPAQUE  IOHEXOL  350 MG/ML SOLN COMPARISON:  July 15, 2024.  August 19, 2023. FINDINGS: CT CHEST FINDINGS Cardiovascular: No evidence of thoracic aortic dissection or aneurysm. Enlarged pulmonary artery suggesting pulmonary artery hypertension. Normal cardiac size. No pericardial effusion. Mediastinum/Nodes: No enlarged mediastinal, hilar, or axillary lymph nodes. Thyroid  gland, trachea, and esophagus demonstrate no significant findings. Lungs/Pleura: Moderate size right pleural effusion is noted with adjacent subsegmental atelectasis. Lobar atelectasis of right lower lobe is noted. No pneumothorax is noted. Stable 1.4 by 1.1 cm left upper lobe lesion with cystic and solid areas best seen on image number 33 of series 4. Musculoskeletal: No chest wall mass or suspicious bone lesions identified. CT ABDOMEN PELVIS FINDINGS Hepatobiliary: No cholelithiasis or biliary dilatation is noted. Stable hepatic cysts. Pancreas: Unremarkable. No pancreatic ductal dilatation or surrounding inflammatory changes. Spleen: Normal in size without focal abnormality. Adrenals/Urinary Tract: Adrenal glands appear  normal. Multiple bilateral renal cystic lesions are again noted, including 2.1 cm partially exophytic complex region arising from midpole of right kidney. This was described as possible renal cell carcinoma on prior MRI. No hydronephrosis or renal obstruction is noted. Urinary bladder is unremarkable. Stomach/Bowel: Stomach is unremarkable. Status post partial colectomy. There is no evidence of bowel obstruction or inflammation. Large left periumbilical hernia is noted which contains loops of small bowel, but does not result in obstruction. Vascular/Lymphatic: Aortic atherosclerosis. No enlarged abdominal or pelvic lymph nodes. Reproductive: Mild prostatic enlargement is noted. Other: No ascites is noted. Musculoskeletal: No acute or significant osseous findings.  IMPRESSION: 1. Moderate size right pleural effusion is noted with adjacent subsegmental atelectasis. Lobar atelectasis of right lower lobe is noted. 2. Stable 1.4 x 1.1 cm left upper lobe lesion with cystic and solid areas. This is concerning for possible malignancy. Consider one of the following in 3 months for both low-risk and high-risk individuals: (a) repeat chest CT, (b) follow-up PET-CT, or (c) tissue sampling. This recommendation follows the consensus statement: Guidelines for Management of Incidental Pulmonary Nodules Detected on CT Images: From the Fleischner Society 2017; Radiology 2017; 284:228-243. 3. Multiple bilateral renal cystic lesions are again noted, including 2.1 cm partially exophytic complex region arising from midpole of right kidney. This was described as possible renal cell carcinoma on prior MRI. Consultation with urology is recommended. 4. Large left periumbilical hernia is noted which contains loops of small bowel, but does not result in obstruction. 5. Mild prostatic enlargement. 6. Aortic atherosclerosis. Aortic Atherosclerosis (ICD10-I70.0). Electronically Signed   By: Lynwood Landy Raddle M.D.   On: 08/08/2024 16:00   DG Chest 2 View Result Date: 08/07/2024 CLINICAL DATA:  Shortness of breath. EXAM: DG CHEST 2V COMPARISON:  Chest radiograph dated 07/23/2024. FINDINGS: Moderate right pleural effusion similar or slightly increased since the prior radiograph. There is associated right lung base atelectasis. Pneumonia is not excluded. Follow-up to resolution recommended. No pneumothorax. Stable cardiac silhouette. No acute osseous pathology. IMPRESSION: Moderate right pleural effusion similar or slightly increased since the prior radiograph. Electronically Signed   By: Vanetta Chou M.D.   On: 08/07/2024 15:36    Microbiology: Results for orders placed or performed in visit on 01/16/24  Microscopic Examination     Status: Abnormal   Collection Time: 01/16/24 12:00 AM   Urine   Result Value Ref Range Status   WBC, UA 0-5 0 - 5 /hpf Final   RBC, Urine 0-2 0 - 2 /hpf Final   Epithelial Cells (non renal) 0-10 0 - 10 /hpf Final   Mucus, UA Present (A) Not Estab. Final   Bacteria, UA Few None seen/Few Final  Urine culture     Status: None   Collection Time: 01/16/24  1:53 PM   Specimen: Urine   UR  Result Value Ref Range Status   Urine Culture, Routine Final report  Final   Organism ID, Bacteria No growth  Final    Labs: CBC: Recent Labs  Lab 08/17/24 0539 08/18/24 0526 08/19/24 0513 08/20/24 0531  WBC 7.5 7.1 7.3 6.8  NEUTROABS 4.9 4.6 4.9 4.1  HGB 10.2* 10.7* 10.8* 11.9*  HCT 29.9* 32.6* 32.4* 35.2*  MCV 92.9 93.7 93.1 92.4  PLT 397 378 399 431*   Basic Metabolic Panel: Recent Labs  Lab 08/16/24 1005 08/17/24 0539 08/18/24 0526 08/19/24 0513 08/20/24 0531  NA 135 138 136 134* 135  K 3.3* 3.8 4.2 4.1 4.2  CL 99 101 100 98  97*  CO2 28 28 28 29 29   GLUCOSE 144* 90 97 111* 106*  BUN 11 7* <5* 6* 6*  CREATININE 0.98 0.85 0.84 0.87 0.95  CALCIUM  8.5* 8.4* 8.7* 8.8* 9.0  MG 1.8 2.0 1.7 1.8 1.9  PHOS 2.8 3.6 3.8 4.0 3.5   Liver Function Tests: Recent Labs  Lab 08/16/24 1005 08/17/24 0539 08/18/24 0526 08/19/24 0513 08/20/24 0531  AST 30 18 13* 13* 13*  ALT 40 34 27 24 27   ALKPHOS 67 73 72 71 77  BILITOT 0.6 0.7 0.7 0.7 0.7  PROT 5.4* 5.6* 5.5* 5.7* 6.2*  ALBUMIN  3.1* 3.2* 3.1* 3.2* 3.4*   CBG: Recent Labs  Lab 08/22/24 0828 08/22/24 1138 08/22/24 1616 08/22/24 2040 08/23/24 0753  GLUCAP 168* 134* 116* 114* 124*    Discharge time spent: 40 minutes.  Signed: Deliliah Room, MD [facility] 08/23/2024 "

## 2024-08-25 ENCOUNTER — Telehealth: Payer: Self-pay | Admitting: Hematology and Oncology

## 2024-08-25 ENCOUNTER — Telehealth: Payer: Self-pay | Admitting: *Deleted

## 2024-08-25 NOTE — Telephone Encounter (Signed)
 I spoke to pt emergency contact regarding scheduling an appt

## 2024-08-25 NOTE — Telephone Encounter (Signed)
 Review of chart reveals pt was d/c on 1/17 and was advised to f/u with PCP and coumadin  clinic in 1 week. INR at d/c was 2.2  Pt has a coumadin  clinic apt tomorrow.   Pt has apt with PCP on 1/29

## 2024-08-25 NOTE — Transitions of Care (Post Inpatient/ED Visit) (Signed)
 "  08/25/2024  Name: Rick Edwards MRN: [id number] DOB: 18-Dec-1955  Today's TOC FU Call Status: Today's TOC FU Call Status:: Successful TOC FU Call Completed TOC FU Call Complete Date: 08/25/24  Patient's Name and Date of Birth confirmed. Name, DOB  Transition Care Management Follow-up Telephone Call Date of Discharge: 08/23/24 Discharge Facility: [facility]) Type of Discharge: Inpatient Admission Primary Inpatient Discharge Diagnosis:: GI Bleed How have you been since you were released from the hospital?: Better Any questions or concerns?: No  Items Reviewed: Did you receive and understand the discharge instructions provided?: Yes Medications obtained,verified, and reconciled?: Yes (Medications Reviewed) Any new allergies since your discharge?: No Dietary orders reviewed?: Yes Type of Diet Ordered:: Heart Healthy Do you have support at home?: Yes People in Home [RPT]: sibling(s) Name of Support/Comfort Primary Source: Rick Edwards  Medications Reviewed Today: Medications Reviewed Today     Reviewed by Lucky Andrea LABOR, RN (Registered Nurse) on 08/25/24 at [phone]  Med List Status: <None>   Medication Order Taking? Sig Documenting Provider Last Dose Status Informant  albuterol  (VENTOLIN  HFA) 108 (90 Base) MCG/ACT inhaler [id number] Yes Inhale 1-2 puffs into the lungs every 6 (six) hours as needed for wheezing or shortness of breath. Reddick, Johnathan B, NP  Active   allopurinol  (ZYLOPRIM ) 300 MG tablet [id number] Yes TAKE 1 TABLET BY MOUTH EVERY DAY Joshua Debby CROME, MD  Active   amLODipine  (NORVASC ) 10 MG tablet [id number] Yes TAKE 1 TABLET BY MOUTH EVERYDAY AT BEDTIME Joshua Debby CROME, MD  Active   atorvastatin  (LIPITOR) 40 MG tablet [id number] Yes TAKE 1 TABLET BY MOUTH EVERY DAY Joshua Debby CROME, MD  Active   carvedilol  (COREG ) 25 MG tablet [id number] Yes TAKE 1 TABLET (25 MG TOTAL) BY MOUTH TWICE A DAY WITH MEALS Joshua Debby CROME, MD  Active   doxazosin  (CARDURA ) 4 MG tablet  [id number] Yes Take 1 tablet (4 mg total) by mouth at bedtime. Joshua Debby CROME, MD  Active   omega-3 acid ethyl esters (LOVAZA ) 1 g capsule [id number]  Take 2 g by mouth 2 (two) times daily.  Patient not taking: Reported on 08/25/2024   [provider]  Active   oxyCODONE  (ROXICODONE ) 5 MG immediate release tablet [id number] Yes Take 1 tablet (5 mg total) by mouth every 8 (eight) hours as needed for severe pain (pain score 7-10). Rashid, Farhan, MD  Active   spironolactone  (ALDACTONE ) 25 MG tablet [id number] Yes Take 1 tablet (25 mg total) by mouth daily. Joshua Debby CROME, MD  Active   warfarin (COUMADIN ) 5 MG tablet [id number] Yes TAKE 1 TABLET BY MOUTH DAILY ON MONDAY, WEDNESDAY, AND FRIDAY. TAKE 1 and 1/2 TABLETS ON ALL OTHER DAYS OR AS DIRECTED BY CLINIC. Dino Antu, MD  Active   Med List Note Roselee Teena BRAVO, CPhT 05/10/12 1734): Patient family member states that doctor wanted him to stop the coumadin  before procedure.             Home Care and Equipment/Supplies: Were Home Health Services Ordered?: No Any new equipment or medical supplies ordered?: No  Functional Questionnaire: Do you need assistance with bathing/showering or dressing?: No Do you need assistance with meal preparation?: No Do you need assistance with eating?: No Do you have difficulty maintaining continence: No Do you need assistance with getting out of bed/getting out of a chair/moving?: No Do you have difficulty managing or taking your medications?: No  Follow up appointments reviewed: PCP Follow-up appointment confirmed?: No (RNCM assisted with scheduling  on 09/04/24) MD Provider Line Number:[phone] Given: No Specialist Hospital Follow-up appointment confirmed?: Yes Date of Specialist follow-up appointment?: 09/10/24 Follow-Up Specialty Provider:: Oncology Do you need transportation to your follow-up appointment?: No Do you understand care options if your condition(s) worsen?: Yes-patient  verbalized understanding  SDOH Interventions Today    Flowsheet Row Most Recent Value  SDOH Interventions   Food Insecurity Interventions Intervention Not Indicated  Housing Interventions Intervention Not Indicated  Transportation Interventions Intervention Not Indicated  Utilities Interventions Intervention Not Indicated    Andrea Dimes RN, BSN [place]  [facility] RN Care Manager [phone]  "

## 2024-08-26 ENCOUNTER — Ambulatory Visit

## 2024-08-26 DIAGNOSIS — Z7901 Long term (current) use of anticoagulants: Secondary | ICD-10-CM | POA: Diagnosis not present

## 2024-08-26 LAB — POCT INR: INR: 3.1 — AB (ref 2.0–3.0)

## 2024-08-26 NOTE — Progress Notes (Signed)
 Indication: antithrombin III  deficiency, DVT Pt presented to ER for rectal bleeding on 1/1. Diagnosis metastatic GI malignancy but could be lung primary per oncology. Pt was d/ on 1/17 and INR was 2.2 at that time. Reduce dose today to take 1 tablet and then continue 1 1/2 tablets daily except take 1 tablet on Monday, Wednesday and Friday. Recheck in 2 weeks.

## 2024-08-26 NOTE — Patient Instructions (Addendum)
 Pre visit review using our clinic review tool, if applicable. No additional management support is needed unless otherwise documented below in the visit note.  Reduce dose today to take 1 tablet and then continue 1 1/2 tablets daily except take 1 tablet on Monday, Wednesday and Friday. Recheck in 2 weeks.

## 2024-08-28 ENCOUNTER — Other Ambulatory Visit: Payer: Self-pay | Admitting: Internal Medicine

## 2024-08-28 ENCOUNTER — Encounter (HOSPITAL_COMMUNITY): Payer: Self-pay

## 2024-08-28 DIAGNOSIS — Z7901 Long term (current) use of anticoagulants: Secondary | ICD-10-CM

## 2024-08-28 DIAGNOSIS — I743 Embolism and thrombosis of arteries of the lower extremities: Secondary | ICD-10-CM

## 2024-08-28 DIAGNOSIS — G893 Neoplasm related pain (acute) (chronic): Secondary | ICD-10-CM

## 2024-08-28 MED ORDER — OXYCODONE HCL 5 MG PO TABS: 5.0000 mg | ORAL_TABLET | Freq: Three times a day (TID) | ORAL | 0 refills | Status: AC | PRN

## 2024-08-28 NOTE — Telephone Encounter (Signed)
 Pt contacted coumadin  clinic requesting this nurse contact his sister, Alisa, because she has questions.   Contacted Gail who requested clarification concerning pt's diet and the amount of greens he can eat. Educated gail and answered all other questions.   She reports pt will be out of oxycodone  over the weekend and with the potential bad winter weather coming in she wants to request a refill now so they can pick it up for him before the weekend. She requested a msg be sent to PCP requesting refill. Advised a msg will be sent and this nurse will f/u. Alisa appreciated all of the help and for this nurse taking care of her brother.    Pt has hospital f/u next week on 1/29.

## 2024-08-29 ENCOUNTER — Encounter (HOSPITAL_COMMUNITY): Payer: Self-pay

## 2024-09-02 ENCOUNTER — Encounter (HOSPITAL_COMMUNITY): Payer: Self-pay | Admitting: Internal Medicine

## 2024-09-04 ENCOUNTER — Other Ambulatory Visit: Payer: Self-pay

## 2024-09-04 ENCOUNTER — Emergency Department (HOSPITAL_COMMUNITY)

## 2024-09-04 ENCOUNTER — Inpatient Hospital Stay (HOSPITAL_COMMUNITY)

## 2024-09-04 ENCOUNTER — Inpatient Hospital Stay: Admitting: Internal Medicine

## 2024-09-04 ENCOUNTER — Inpatient Hospital Stay (HOSPITAL_COMMUNITY)
Admission: EM | Admit: 2024-09-04 | Source: Home / Self Care | Attending: Internal Medicine | Admitting: Internal Medicine

## 2024-09-04 ENCOUNTER — Encounter (HOSPITAL_COMMUNITY): Payer: Self-pay

## 2024-09-04 DIAGNOSIS — J90 Pleural effusion, not elsewhere classified: Secondary | ICD-10-CM | POA: Diagnosis not present

## 2024-09-04 DIAGNOSIS — I10 Essential (primary) hypertension: Secondary | ICD-10-CM

## 2024-09-04 DIAGNOSIS — E876 Hypokalemia: Secondary | ICD-10-CM

## 2024-09-04 DIAGNOSIS — E785 Hyperlipidemia, unspecified: Secondary | ICD-10-CM

## 2024-09-04 DIAGNOSIS — R0602 Shortness of breath: Secondary | ICD-10-CM

## 2024-09-04 DIAGNOSIS — R0902 Hypoxemia: Secondary | ICD-10-CM

## 2024-09-04 DIAGNOSIS — Z515 Encounter for palliative care: Secondary | ICD-10-CM

## 2024-09-04 DIAGNOSIS — E43 Unspecified severe protein-calorie malnutrition: Secondary | ICD-10-CM | POA: Insufficient documentation

## 2024-09-04 LAB — URINALYSIS, ROUTINE W REFLEX MICROSCOPIC
Bacteria, UA: NONE SEEN
Bilirubin Urine: NEGATIVE
Glucose, UA: NEGATIVE mg/dL
Hgb urine dipstick: NEGATIVE
Ketones, ur: NEGATIVE mg/dL
Leukocytes,Ua: NEGATIVE
Nitrite: NEGATIVE
Protein, ur: 30 mg/dL — AB
Specific Gravity, Urine: 1.023 (ref 1.005–1.030)
pH: 5 (ref 5.0–8.0)

## 2024-09-04 LAB — COMPREHENSIVE METABOLIC PANEL WITH GFR
ALT: 20 U/L (ref 0–44)
AST: 19 U/L (ref 15–41)
Albumin: 3.9 g/dL (ref 3.5–5.0)
Alkaline Phosphatase: 108 U/L (ref 38–126)
Anion gap: 12 (ref 5–15)
BUN: 18 mg/dL (ref 8–23)
CO2: 26 mmol/L (ref 22–32)
Calcium: 10.1 mg/dL (ref 8.9–10.3)
Chloride: 100 mmol/L (ref 98–111)
Creatinine, Ser: 1.06 mg/dL (ref 0.61–1.24)
GFR, Estimated: >60 mL/min
Glucose, Bld: 146 mg/dL — ABNORMAL HIGH (ref 70–99)
Potassium: 4.5 mmol/L (ref 3.5–5.1)
Sodium: 138 mmol/L (ref 135–145)
Total Bilirubin: 0.8 mg/dL (ref 0.0–1.2)
Total Protein: 7.4 g/dL (ref 6.5–8.1)

## 2024-09-04 LAB — CBC
HCT: 37.5 % — ABNORMAL LOW (ref 39.0–52.0)
HCT: 34.8 % — ABNORMAL LOW (ref 39.0–52.0)
Hemoglobin: 12.2 g/dL — ABNORMAL LOW (ref 13.0–17.0)
Hemoglobin: 11.5 g/dL — ABNORMAL LOW (ref 13.0–17.0)
MCH: 30.1 pg (ref 26.0–34.0)
MCH: 30.3 pg (ref 26.0–34.0)
MCHC: 32.5 g/dL (ref 30.0–36.0)
MCHC: 33 g/dL (ref 30.0–36.0)
MCV: 92.6 fL (ref 80.0–100.0)
MCV: 91.8 fL (ref 80.0–100.0)
Platelets: 513 10*3/uL — ABNORMAL HIGH (ref 150–400)
Platelets: 498 10*3/uL — ABNORMAL HIGH (ref 150–400)
RBC: 4.05 MIL/uL — ABNORMAL LOW (ref 4.22–5.81)
RBC: 3.79 MIL/uL — ABNORMAL LOW (ref 4.22–5.81)
RDW: 14.3 % (ref 11.5–15.5)
RDW: 14.3 % (ref 11.5–15.5)
WBC: 10.5 10*3/uL (ref 4.0–10.5)
WBC: 11.7 10*3/uL — ABNORMAL HIGH (ref 4.0–10.5)
nRBC: 0 % (ref 0.0–0.2)
nRBC: 0 % (ref 0.0–0.2)

## 2024-09-04 LAB — LACTATE DEHYDROGENASE: LDH: 283 U/L — ABNORMAL HIGH (ref 105–235)

## 2024-09-04 LAB — LIPASE, BLOOD: Lipase: 27 U/L (ref 11–51)

## 2024-09-04 LAB — BODY FLUID CELL COUNT WITH DIFFERENTIAL

## 2024-09-04 LAB — PROTIME-INR
INR: 5.7 (ref 0.8–1.2)
Prothrombin Time: 54 s — ABNORMAL HIGH (ref 11.4–15.2)

## 2024-09-04 LAB — PROTEIN, TOTAL: Total Protein: 6.9 g/dL (ref 6.5–8.1)

## 2024-09-04 MED ORDER — ACETAMINOPHEN 325 MG PO TABS: 650.0000 mg | ORAL_TABLET | Freq: Four times a day (QID) | ORAL | Status: DC | PRN

## 2024-09-04 MED ORDER — ATORVASTATIN CALCIUM 40 MG PO TABS
40.0000 mg | ORAL_TABLET | Freq: Every day | ORAL | Status: DC
  Administered 2024-09-04 – 2024-09-07 (×4): 40 mg via ORAL
  Filled 2024-09-04 (×4): qty 1

## 2024-09-04 MED ORDER — AMLODIPINE BESYLATE 10 MG PO TABS
10.0000 mg | ORAL_TABLET | Freq: Every day | ORAL | Status: DC
  Administered 2024-09-04 – 2024-09-06 (×3): 10 mg via ORAL
  Filled 2024-09-04 (×4): qty 1

## 2024-09-04 MED ORDER — DOXAZOSIN MESYLATE 2 MG PO TABS
4.0000 mg | ORAL_TABLET | Freq: Every day | ORAL | Status: DC
  Administered 2024-09-04 – 2024-09-06 (×3): 4 mg via ORAL
  Filled 2024-09-04 (×4): qty 2

## 2024-09-04 MED ORDER — ALBUTEROL SULFATE (2.5 MG/3ML) 0.083% IN NEBU: 2.5000 mg | INHALATION_SOLUTION | RESPIRATORY_TRACT | Status: AC | PRN

## 2024-09-04 MED ORDER — FENTANYL CITRATE (PF) 50 MCG/ML IJ SOSY
50.0000 ug | PREFILLED_SYRINGE | Freq: Once | INTRAMUSCULAR | Status: AC
  Administered 2024-09-04: 50 ug via INTRAVENOUS
  Filled 2024-09-04: qty 1

## 2024-09-04 MED ORDER — ACETAMINOPHEN 650 MG RE SUPP: 650.0000 mg | Freq: Four times a day (QID) | RECTAL | Status: DC | PRN

## 2024-09-04 MED ORDER — ONDANSETRON HCL 4 MG PO TABS: 4.0000 mg | ORAL_TABLET | Freq: Four times a day (QID) | ORAL | Status: AC | PRN

## 2024-09-04 MED ORDER — TRAZODONE HCL 50 MG PO TABS
25.0000 mg | ORAL_TABLET | Freq: Every evening | ORAL | Status: DC | PRN
  Administered 2024-09-04 – 2024-09-07 (×3): 25 mg via ORAL
  Filled 2024-09-04 (×3): qty 1

## 2024-09-04 MED ORDER — SPIRONOLACTONE 25 MG PO TABS
25.0000 mg | ORAL_TABLET | Freq: Every day | ORAL | Status: DC
  Administered 2024-09-04 – 2024-09-07 (×4): 25 mg via ORAL
  Filled 2024-09-04 (×4): qty 1

## 2024-09-04 MED ORDER — OXYCODONE HCL 5 MG PO TABS
10.0000 mg | ORAL_TABLET | ORAL | Status: DC | PRN
  Administered 2024-09-06 – 2024-09-07 (×2): 10 mg via ORAL
  Filled 2024-09-04 (×2): qty 2

## 2024-09-04 MED ORDER — ONDANSETRON HCL 4 MG/2ML IJ SOLN
4.0000 mg | Freq: Four times a day (QID) | INTRAMUSCULAR | Status: AC | PRN
  Administered 2024-09-04: 4 mg via INTRAVENOUS
  Filled 2024-09-04: qty 2

## 2024-09-04 MED ORDER — HYDROMORPHONE HCL 1 MG/ML IJ SOLN
0.5000 mg | INTRAMUSCULAR | Status: AC | PRN
  Administered 2024-09-04 – 2024-09-12 (×16): 1 mg via INTRAVENOUS
  Filled 2024-09-04 (×17): qty 1

## 2024-09-04 MED ORDER — CARVEDILOL 25 MG PO TABS
25.0000 mg | ORAL_TABLET | Freq: Two times a day (BID) | ORAL | Status: DC
  Administered 2024-09-04 – 2024-09-07 (×7): 25 mg via ORAL
  Filled 2024-09-04 (×7): qty 1

## 2024-09-04 MED ORDER — PHYTONADIONE 5 MG PO TABS
2.5000 mg | ORAL_TABLET | Freq: Once | ORAL | Status: AC
  Administered 2024-09-04: 2.5 mg via ORAL
  Filled 2024-09-04: qty 1

## 2024-09-04 NOTE — H&P (Signed)
 " History and Physical  Rick Edwards FMW:[id number] DOB: 1956/06/04 DOA: 09/04/2024  PCP: Joshua Debby CROME, MD   Chief Complaint: Right sided chest pain  HPI: Rick Edwards is a 69 y.o. 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  History is provided by the patient, and assisted by his brother Rick Edwards who is at the bedside this afternoon.  They tell me that since going home, his right sided chest pain has gradually worsened, he has also developed some dyspnea with exertion.  He has had some nausea, but no vomiting, no fevers, no abdominal pain, and no evidence of GI bleeding.  He has been compliant with his home medication including his Coumadin .  He has overall been weak, having some trouble performing his ADLs.  Review of Systems: Please see HPI for pertinent positives and negatives. A complete 10 system review of systems are otherwise negative.  Past Medical History:  Diagnosis Date   Anemia    anemia thrombocytopenia, saw Hematology 2011, can not r/o myeloproliferative d/c   Antithrombin III  deficiency 05/16/2013   On coumadin  for this   Blood clot in vein    in right leg   Chronic kidney disease 05/27/2013   ACUTE RENAL FAILURE    Crohn's disease (HCC)    tx. Imuran    GI bleed 6/11   w/ normal EGD 6/11, 3 PRBCs    Hearing loss    wears hearing aid left ear; birthed with hearing loss   HOH (hard of hearing) 05/16/2013   Hypercalcemia    Hyperkalemia 05/27/2013   Hypertension    Ileostomy in place [facility]) 04-11-13   04-18-13 ileostomy to be taken down.   Perianal  abscess 2001   s/p right hemicolectomy, and drainage of retroperitoneal abcess 2003   Peripheral vascular disease    Transfusion history    last 8'13   Past Surgical History:  Procedure Laterality Date   Anal fistulotomy  2002   BRONCHIAL NEEDLE ASPIRATION BIOPSY  08/14/2024   Procedure: BRONCHOSCOPY, WITH NEEDLE ASPIRATION BIOPSY;  Surgeon: Zaida Zola SAILOR, MD;  Location: [facility] ENDOSCOPY;  Service: Pulmonary;;   CRYOTHERAPY  08/14/2024   Procedure: CRYOTHERAPY;  Surgeon: Zaida Zola SAILOR, MD;  Location: [facility] ENDOSCOPY;  Service: Pulmonary;;   EMBOLECTOMY  03/12/2012   Procedure: EMBOLECTOMY;  Surgeon: Lonni GORMAN Blade, MD;  Location: [facility] OR;  Service: Vascular;  Laterality: Right;  Right popliteal embolectomy with vein patch angioplasty, right posterior tibial embolectomy with vein patch angioplasty    ESOPHAGOGASTRODUODENOSCOPY N/A 12/12/2023   Procedure: EGD (ESOPHAGOGASTRODUODENOSCOPY);  Surgeon: Burnette Fallow, MD;  Location: [facility];  Service: Gastroenterology;  Laterality: N/A;   ESOPHAGOGASTRODUODENOSCOPY N/A 08/11/2024   Procedure: EGD (ESOPHAGOGASTRODUODENOSCOPY);  Surgeon: Dianna Specking, MD;  Location: [facility] ENDOSCOPY;  Service: Gastroenterology;  Laterality: N/A;   HEMICOLECTOMY  08/29/2001   perforated abscess   HEMOSTASIS CLIP PLACEMENT  08/11/2024   Procedure: CONTROL OF HEMORRHAGE, GI TRACT, ENDOSCOPIC, BY HEMOSTATIC POWDER APPLICATION;  Surgeon: Dianna Specking, MD;  Location: [facility] ENDOSCOPY;  Service: Gastroenterology;;   ILEOSTOMY CLOSURE N/A 04/18/2013   Procedure: BRENNA TAKEDOWN;  Surgeon: Morene ONEIDA Olives, MD;  Location: [facility] ORS;  Service: General;  Laterality:  N/A;   INTRAOPERATIVE ARTERIOGRAM  03/12/2012   Procedure: INTRA OPERATIVE ARTERIOGRAM;  Surgeon: Lonni GORMAN Blade, MD;  Location: [facility] OR;  Service: Vascular;  Laterality: Right;   PARTIAL COLECTOMY  03/11/2012   Procedure: PARTIAL COLECTOMY;  Surgeon: Morene ONEIDA Olives, MD;  Location: [facility] ORS;  Service:  General;  Laterality: N/A;  subtotal colectomy transverse and left colon    TRANSMETATARSAL AMPUTATION  05/10/2012   right   UPPER ESOPHAGEAL ENDOSCOPIC ULTRASOUND (EUS) N/A 12/12/2023   Procedure: UPPER ESOPHAGEAL ENDOSCOPIC ULTRASOUND (EUS);  Surgeon: Burnette Fallow, MD;  Location: [facility];  Service: Gastroenterology;  Laterality: N/A;   VIDEO BRONCHOSCOPY WITH ENDOBRONCHIAL NAVIGATION Left 08/14/2024   Procedure: VIDEO BRONCHOSCOPY WITH ENDOBRONCHIAL NAVIGATION;  Surgeon: Zaida Zola SAILOR, MD;  Location: [facility] ENDOSCOPY;  Service: Pulmonary;  Laterality: Left;   VIDEO BRONCHOSCOPY WITH ENDOBRONCHIAL ULTRASOUND  08/14/2024   Procedure: BRONCHOSCOPY, WITH EBUS;  Surgeon: Zaida Zola SAILOR, MD;  Location: [facility] ENDOSCOPY;  Service: Pulmonary;;   VIDEO BRONCHOSCOPY WITH RADIAL ENDOBRONCHIAL ULTRASOUND  08/14/2024   Procedure: VIDEO BRONCHOSCOPY WITH RADIAL ENDOBRONCHIAL ULTRASOUND;  Surgeon: Zaida Zola SAILOR, MD;  Location: [facility] ENDOSCOPY;  Service: Pulmonary;;   Social History:  reports that he quit smoking about 13 years ago. His smoking use included cigarettes. He started smoking about 22 years ago. He has a 2.3 pack-year smoking history. He has never used smokeless tobacco. He reports that he does not currently use drugs after having used the following drugs: Marijuana. He reports that he does not drink alcohol.  Allergies[1]  Family History  Problem Relation Age of Onset   Hypertension Mother    Hyperlipidemia Mother    Hypertension Father    Hyperlipidemia Father    Heart disease Father    Stroke Other        GF, aunts    Hypertension Other        the whole family   Coronary artery disease Neg Hx    Diabetes Neg Hx    Colon cancer Neg Hx    Prostate cancer Neg Hx    Kidney failure Neg Hx      Prior to Admission medications  Medication Sig Start Date End Date Taking? Authorizing Provider  albuterol  (VENTOLIN  HFA) 108 (90 Base) MCG/ACT inhaler Inhale 1-2 puffs into the lungs every 6 (six)  hours as needed for wheezing or shortness of breath. 07/23/24   Reddick, Johnathan B, NP  allopurinol  (ZYLOPRIM ) 300 MG tablet TAKE 1 TABLET BY MOUTH EVERY DAY 07/21/24   Joshua Debby CROME, MD  amLODipine  (NORVASC ) 10 MG tablet TAKE 1 TABLET BY MOUTH EVERYDAY AT BEDTIME 07/21/24   Joshua Debby CROME, MD  atorvastatin  (LIPITOR) 40 MG tablet TAKE 1 TABLET BY MOUTH EVERY DAY 07/11/24   Joshua Debby CROME, MD  carvedilol  (COREG ) 25 MG tablet TAKE 1 TABLET (25 MG TOTAL) BY MOUTH TWICE A DAY WITH MEALS 08/11/24   Joshua Debby CROME, MD  doxazosin  (CARDURA ) 4 MG tablet Take 1 tablet (4 mg total) by mouth at bedtime. 05/26/24   Joshua Debby CROME, MD  omega-3 acid ethyl esters (LOVAZA ) 1 g capsule Take 2 g by mouth 2 (two) times daily. Patient not taking: Reported on 08/25/2024    [provider]  oxyCODONE  (ROXICODONE ) 5 MG immediate release tablet Take 1 tablet (5 mg total) by mouth every 8 (eight) hours as needed for severe pain (pain score 7-10). 08/28/24 08/28/25  Joshua Debby CROME, MD  spironolactone  (ALDACTONE ) 25 MG tablet Take 1 tablet (25  mg total) by mouth daily. 10/26/23   Joshua Debby CROME, MD  warfarin (COUMADIN ) 5 MG tablet TAKE 1 TABLET BY MOUTH DAILY ON MONDAY, WEDNESDAY, AND FRIDAY. TAKE 1 and 1/2 TABLETS ON ALL OTHER DAYS OR AS DIRECTED BY CLINIC. 08/23/24   Dino Antu, MD    Physical Exam: BP (!) 140/77 (BP Location: Left Arm)   Pulse 94   Temp (!) 97.3 F (36.3 C) (Oral)   Resp (!) 22   Ht 5' 9 (1.753 m)   Wt 92.5 kg   SpO2 100%   BMI 30.13 kg/m  General:  Alert, oriented, chronically ill-appearing, looks slightly uncomfortable but not in acute distress.  He is on room air, slightly tachypneic.  Able to speak several words at a time.  His brother is at the bedside. Eyes: EOMI, clear conjuctivae, white sclerea Neck: supple, no masses, trachea mildline  Cardiovascular: RRR, no murmurs or rubs, no peripheral edema  Respiratory: Almost absent breath sounds on the right, diminished but  present in all lung fields on the left side.  No wheezing, mild tachypnea, no retractions or stridor. Abdomen: soft, nontender, nondistended, reducible left-sided hernia, normal bowel tones heard  Skin: dry, no rashes  Musculoskeletal: no joint effusions, normal range of motion  Psychiatric: appropriate affect, normal speech  Neurologic: extraocular muscles intact, clear speech, moving all extremities with intact sensorium         Labs on Admission:  Basic Metabolic Panel: Recent Labs  Lab 09/04/24 1026  NA 138  K 4.5  CL 100  CO2 26  GLUCOSE 146*  BUN 18  CREATININE 1.06  CALCIUM  10.1   Liver Function Tests: Recent Labs  Lab 09/04/24 1026  AST 19  ALT 20  ALKPHOS 108  BILITOT 0.8  PROT 7.4  ALBUMIN  3.9   Recent Labs  Lab 09/04/24 1026  LIPASE 27   No results for input(s): AMMONIA in the last 168 hours. CBC: Recent Labs  Lab 09/04/24 1026  WBC 10.5  HGB 12.2*  HCT 37.5*  MCV 92.6  PLT 513*   Cardiac Enzymes: No results for input(s): CKTOTAL, CKMB, CKMBINDEX, TROPONINI in the last 168 hours. BNP (last 3 results) Recent Labs    07/18/24 2317  BNP 12.3    ProBNP (last 3 results) Recent Labs    08/08/24 1227  PROBNP <50.0    CBG: No results for input(s): GLUCAP in the last 168 hours.  Radiological Exams on Admission: DG Chest Portable 1 View Result Date: 09/04/2024 EXAM: 1 VIEW(S) XRAY OF THE CHEST 09/04/2024 09:27:55 AM COMPARISON: 08/18/2024 CLINICAL HISTORY: Shortness of breath. FINDINGS: LUNGS AND PLEURA: Large right pleural effusion now resulting in complete opacification of the right hemithorax. No acute radiographic abnormality in the left lung. No pneumothorax. HEART AND MEDIASTINUM: The mediastinum is partially obscured. Subtle rightward mediastinal shift. BONES AND SOFT TISSUES: No acute osseous abnormality. IMPRESSION: 1. Large right pleural effusion resulting in complete opacification of the right hemithorax. 2. No acute  radiographic abnormality in the left lung. Electronically signed by: Donnice Mania MD 09/04/2024 10:15 AM EST RP Workstation: [id number]   Assessment/Plan KEDRON UNO is a 69 y.o. 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   Recurrent right sided pleural effusion-likely malignant, and likely the source of his right lower chest pain.  Pain is not pleuritic, he is not hypoxic and so I have low suspicion for PE especially since he is presumably anticoagulated.  INR from today is pending. -Inpatient admission -Monitor closely on progressive unit, with cardiac telemetry -Pain control as below -Pulmonology consulted regarding management recommendations, unsure if the patient would benefit from repeat thoracentesis as only 100 cc was obtained earlier this month.  May benefit from CT imaging, will defer to pulmonology.  Right sided chest pain-presumably due to his intrathoracic malignancy and associated large pleural effusion. -Oxycodone  10 mg p.o. every 4 hours as needed moderate pain -IV Dilaudid  0.5 to 1 mg IV every 2 hours as needed severe pain -Palliative care consult for symptom management  Adenocarcinoma-unclear source, primary GI malignancy with intrathoracic metastases versus primary pulmonary etiology.  He had biopsy done via bronchoscopy with pulmonology at [facility] earlier this month.  He was scheduled for outpatient PET scan 09/05/2024, would likely missed his appointment due to being admitted to the hospital with uncontrolled pain and recurrent effusion. -Oncology Dr.  Federico consulted  Hypertension-continue Norvasc , Coreg , Aldactone , Cardura   Hyperlipidemia-Lipitor  Antithrombin III  deficiency-chronically on Coumadin , historically has been therapeutic.  Was recently hospitalized for GI bleeding, but has had no recurrence of this and hemoglobin appears to be stable. -Check INR -Consider holding Coumadin  for the time being as he may require further intervention as above  DVT prophylaxis: SCDs only as patient is anticoagulated with Coumadin , potentially will hold anticoagulation for possible procedures as above.    Code Status: Full Code-palliative care has been consulted however, given his diagnosis and potentially poor prognosis.  Consults called: Oncology, pulmonology, palliative care.  Admission status: The appropriate patient status for this patient is INPATIENT. Inpatient status is judged to be reasonable and necessary in order to provide the required intensity of service to ensure the patient's safety. The patient's presenting symptoms, physical exam findings, and initial radiographic and laboratory data in the context of their chronic comorbidities is felt to place them at high risk for further clinical deterioration. Furthermore, it is not anticipated that the patient will be medically stable for discharge from the hospital within 2 midnights of admission.    I certify that at the point of admission it is my clinical judgment that the patient will require inpatient hospital care spanning beyond 2 midnights from the point of admission due to high intensity of service, high risk for further deterioration and high frequency of surveillance required  Time spent: 53 minutes  Krisha Beegle CHRISTELLA Gail MD [facility] Pager [phone]  If 7PM-7AM, please contact night-coverage [email or web address] Password [facility]  09/04/2024, 12:43 PM      [1]  Allergies Allergen Reactions   Olmesartan      Hx of severe AKI requiring HD on ACE per renal note, hx of AKI  and hypotension with olmesartan  07/2023   Mesalamine Rash    REACTION: Rash   "

## 2024-09-04 NOTE — Progress Notes (Signed)
 H&H ordered for 1600  Ordered vitamin K  2.5 mg p.o. x 1  Repeat x-ray in a.m.  likely needs chest tube drainage

## 2024-09-04 NOTE — ED Provider Notes (Signed)
 " [place] EMERGENCY DEPARTMENT AT [facility] Provider Note   CSN: [id number] Arrival date & time: 09/04/24  9094     Patient presents with: Abdominal Pain   Rick Edwards is a 69 y.o. male.   HPI     69 year old male with extensive history of Antithrombin III  deficiency chronically on Coumadin , CKD, Crohn's disease and recent admission for right sided pleural effusion s/p thoracentesis and perforated viscus, found to have adenocarcinoma of undetermined source comes in with chief complaint of right-sided chest pain.  Patient is accompanied by brother, who provides substantial part of the history.  Patient was discharged from the hospital about 2 weeks ago.  He is supposed to get a PET scan tomorrow.  Patient is taking oxycodone  for pain, but he continues to have severe right sided pain over the chest wall and also having some shortness of breath and increasing weakness.  Patient lives by himself.  Patient denies any new nausea, vomiting, fevers, chills.  He denies any new cough.  Pain has been persistent since discharge, but it has intensified in the last 2 or 3 days.  Prior to Admission medications  Medication Sig Start Date End Date Taking? Authorizing Provider  albuterol  (VENTOLIN  HFA) 108 (90 Base) MCG/ACT inhaler Inhale 1-2 puffs into the lungs every 6 (six) hours as needed for wheezing or shortness of breath. 07/23/24   Reddick, Johnathan B, NP  allopurinol  (ZYLOPRIM ) 300 MG tablet TAKE 1 TABLET BY MOUTH EVERY DAY 07/21/24   Joshua Debby CROME, MD  amLODipine  (NORVASC ) 10 MG tablet TAKE 1 TABLET BY MOUTH EVERYDAY AT BEDTIME 07/21/24   Joshua Debby CROME, MD  atorvastatin  (LIPITOR) 40 MG tablet TAKE 1 TABLET BY MOUTH EVERY DAY 07/11/24   Joshua Debby CROME, MD  carvedilol  (COREG ) 25 MG tablet TAKE 1 TABLET (25 MG TOTAL) BY MOUTH TWICE A DAY WITH MEALS 08/11/24   Joshua Debby CROME, MD  doxazosin  (CARDURA ) 4 MG tablet Take 1 tablet (4 mg total) by mouth at bedtime. 05/26/24   Joshua Debby CROME, MD  omega-3 acid ethyl esters (LOVAZA ) 1 g capsule Take 2 g by mouth 2 (two) times daily. Patient not taking: Reported on 08/25/2024    [provider]  oxyCODONE  (ROXICODONE ) 5 MG immediate release tablet Take 1 tablet (5 mg total) by mouth every 8 (eight) hours as needed for severe pain (pain score 7-10). 08/28/24 08/28/25  Joshua Debby CROME, MD  spironolactone  (ALDACTONE ) 25 MG tablet Take 1 tablet (25 mg total) by mouth daily. 10/26/23   Joshua Debby CROME, MD  warfarin (COUMADIN ) 5 MG tablet TAKE 1 TABLET BY MOUTH DAILY ON MONDAY, WEDNESDAY, AND FRIDAY. TAKE 1 and 1/2 TABLETS ON ALL OTHER DAYS OR AS DIRECTED BY CLINIC. 08/23/24   Dino Antu, MD    Allergies: Olmesartan  and Mesalamine    Review of Systems  All other systems reviewed and are negative.   Updated Vital Signs BP (!) 140/77 (BP Location: Left Arm)   Pulse 94   Temp (!) 97.3 F (36.3 C) (Oral)   Resp (!) 22   Ht 5' 9 (1.753 m)   Wt 92.5 kg   SpO2 100%   BMI 30.13 kg/m   Physical Exam Vitals and nursing note reviewed.  Constitutional:      Appearance: He is well-developed.  HENT:     Head: Atraumatic.  Cardiovascular:     Rate and Rhythm: Tachycardia present.  Pulmonary:     Effort: Pulmonary effort is normal.  Breath sounds: No wheezing.     Comments: Poor aeration on the right side Abdominal:     Tenderness: There is no abdominal tenderness.  Musculoskeletal:     Cervical back: Neck supple.  Skin:    General: Skin is warm.  Neurological:     Mental Status: He is alert and oriented to person, place, and time.     (all labs ordered are listed, but only abnormal results are displayed) Labs Reviewed  COMPREHENSIVE METABOLIC PANEL WITH GFR - Abnormal; Notable for the following components:      Result Value   Glucose, Bld 146 (*)    All other components within normal limits  CBC - Abnormal; Notable for the following components:   RBC 4.05 (*)    Hemoglobin 12.2 (*)    HCT 37.5 (*)     Platelets 513 (*)    All other components within normal limits  URINALYSIS, ROUTINE W REFLEX MICROSCOPIC - Abnormal; Notable for the following components:   Protein, ur 30 (*)    All other components within normal limits  LIPASE, BLOOD  PROTIME-INR    EKG: EKG Interpretation Date/Time:  Thursday September 04 2024 09:14:29 EST Ventricular Rate:  109 PR Interval:  136 QRS Duration:  106 QT Interval:  324 QTC Calculation: 441 R Axis:   -46  Text Interpretation: Sinus tachycardia Consider right atrial enlargement LAD, consider left anterior fascicular block Anteroseptal infarct, age indeterminate Lateral leads are also involved Confirmed by Charlyn Sora [id number]) on 09/04/2024 12:04:01 PM  Radiology: DG Chest Portable 1 View Result Date: 09/04/2024 EXAM: 1 VIEW(S) XRAY OF THE CHEST 09/04/2024 09:27:55 AM COMPARISON: 08/18/2024 CLINICAL HISTORY: Shortness of breath. FINDINGS: LUNGS AND PLEURA: Large right pleural effusion now resulting in complete opacification of the right hemithorax. No acute radiographic abnormality in the left lung. No pneumothorax. HEART AND MEDIASTINUM: The mediastinum is partially obscured. Subtle rightward mediastinal shift. BONES AND SOFT TISSUES: No acute osseous abnormality. IMPRESSION: 1. Large right pleural effusion resulting in complete opacification of the right hemithorax. 2. No acute radiographic abnormality in the left lung. Electronically signed by: Donnice Mania MD 09/04/2024 10:15 AM EST RP Workstation: [id number]     Procedures   Medications Ordered in the ED  fentaNYL  (SUBLIMAZE ) injection 50 mcg (50 mcg Intravenous Given 09/04/24 1206)                                    Medical Decision Making Amount and/or Complexity of Data Reviewed Labs: ordered. Radiology: ordered.  Risk Prescription drug management. Decision regarding hospitalization.   This patient presents to the ED with chief complaint(s) of shortness of breath, weakness,  worsening pain on the right side with pertinent past medical history of Crohn's disease, hypercoagulability on Coumadin  and recent admission for GI bleed, found to have undetermined adenocarcinoma. The complaint involves an extensive differential diagnosis and also carries with it a high risk of complications and morbidity.    Additional history obtained from family. I have also reviewed previous admission documents  The differential diagnosis includes : Cancer related pain, worsening pleural effusion, pneumothorax /collapsed lung. Patient's abdominal exam is reassuring.  He does not have any peritonitis.  I did consider postop complication from his recent perforated viscus in the differential, but abdominal exam is reassuring and patient does not have any nausea, vomiting, fevers, chills.  The initial management included basic labs, x-ray of the chest.  Reassessments:  The following labs were independently interpreted: CBC and CMP are normal.  Hemoglobin at 12.2.  No leukocytosis.  I independently visualized the following imaging with scope of interpretation limited to determining acute life threatening conditions related to emergency care: X-ray of the chest, which revealed right sided Hemi opacification of the lung  Treatment and Reassessment: It appears to me that patient's shortness of breath is because of worsening right-sided pleural effusion.  His pain is in the right sided chest wall, wondering if it is because of this worsening pleural effusion, perhaps collapsed lung or empyema type finding.  He is not septic.  I do not think we need to start antibiotics right now.  Patient will  benefit with admission and further management of the pleural effusion.  I am also concerned about patient's overall wellbeing.  He lives alone.  He has lots of medical comorbidities.  He has undetermined cancer, but it appears that most likely is metastatic disease.  Pain has been an issue.  I will consult  palliative medicine to discuss goals of care and also focus on symptom management.  Final diagnoses:  Pleural effusion on right  Shortness of breath    ED Discharge Orders     None          Charlyn Sora, MD 09/04/24 1219  "

## 2024-09-04 NOTE — Progress Notes (Signed)
 This RN assisted CCM with thoracentesis. Vitals stable throughout procedure & attached below.    09/04/24 1526 09/04/24 1529 09/04/24 1535  Vitals  BP (!) 141/79 127/76 (!) 133/97  MAP (mmHg) 100 91 107  BP Location Right Arm Right Arm Right Arm  BP Method Automatic Automatic Automatic  Patient Position (if appropriate) Lying Lying Lying  ECG Heart Rate (!) 108 (!) 106 (!) 107  Resp  --  (!) 26 (!) 35  Level of Consciousness  Level of Consciousness Alert Alert Alert  Oxygen Therapy  SpO2 95 % 96 % 95 %  O2 Device Nasal Cannula Nasal Cannula Nasal Cannula  O2 Flow Rate (L/min) 2 L/min 2 L/min 2 L/min    09/04/24 1541 09/04/24 1544 09/04/24 1550  Vitals  BP 136/81 131/81 128/78  MAP (mmHg) 96 93 93  BP Location Right Arm Right Arm Right Arm  BP Method Automatic Automatic Automatic  Patient Position (if appropriate) Lying Lying Lying  ECG Heart Rate (!) 107 (!) 107 100  Resp (!) 30 (!) 32 (!) 24  Level of Consciousness  Level of Consciousness  --  Alert Alert  Oxygen Therapy  SpO2  --  95 % 100 %  O2 Device  --  Nasal Cannula Nasal Cannula  O2 Flow Rate (L/min)  --  2 L/min 2 L/min

## 2024-09-04 NOTE — Procedures (Signed)
 Thoracentesis  Procedure Note  Rick Edwards  [id number]  Dec 17, 1955  Date:09/04/24  Time:3:58 PM   Provider Performing:Lolah Coghlan A Gwenyth   Procedure: Thoracentesis with imaging guidance ([id number])  Indication(s) Pleural Effusion  Consent Risks of the procedure as well as the alternatives and risks of each were explained to the patient and/or caregiver.  Consent for the procedure was obtained and is signed in the bedside chart  Anesthesia Topical only with 1% lidocaine     Time Out Verified patient identification, verified procedure, site/side was marked, verified correct patient position, special equipment/implants available, medications/allergies/relevant history reviewed, required imaging and test results available.   Sterile Technique Maximal sterile technique including full sterile barrier drape, hand hygiene, sterile gown, sterile gloves, mask, hair covering, sterile ultrasound probe cover (if used).  Procedure Description Ultrasound was used to identify appropriate pleural anatomy for placement and overlying skin marked.  Area of drainage cleaned and draped in sterile fashion. Lidocaine  was used to anesthetize the skin and subcutaneous tissue.  900 cc's of bloody appearing fluid was drained from the right pleural space. Catheter then removed and bandaid applied to site.   Complications/Tolerance None; patient tolerated the procedure well. Chest X-ray is ordered to confirm no post-procedural complication.   EBL Minimal   Specimen(s) Pleural fluid   Alveria DELENA Gwenyth, NP [facility] 09/04/2024 3:58 PM  Please see [email or web address] for pager details  From 7A-7P. If no response, please call [phone] After hours, please call Elink: [phone]

## 2024-09-04 NOTE — TOC Initial Note (Signed)
 Transition of Care [facility]) - Initial/Assessment Note    Patient Details  Name: Rick Edwards MRN: [id number] Date of Birth: 1955/08/18  [facility]) CM/SW Contact:    Bascom Service, RN Phone Number: 09/04/2024, 3:51 PM  Clinical Narrative:Patient receiving care will continue to monitor for d/c plans.                   Expected Discharge Plan: Home/Self Care Barriers to Discharge: Continued Medical Work up   Patient Goals and CMS Choice Patient states their goals for this hospitalization and ongoing recovery are:: Home          Expected Discharge Plan and Services                                              Prior Living Arrangements/Services                       Activities of Daily Living   ADL Screening (condition at time of admission) Independently performs ADLs?: No Does the patient have a NEW difficulty with bathing/dressing/toileting/self-feeding that is expected to last >3 days?: Yes (Initiates electronic notice to provider for possible OT consult) Does the patient have a NEW difficulty with getting in/out of bed, walking, or climbing stairs that is expected to last >3 days?: Yes (Initiates electronic notice to provider for possible PT consult) Does the patient have a NEW difficulty with communication that is expected to last >3 days?: Yes (Initiates electronic notice to provider for possible SLP consult) Is the patient deaf or have difficulty hearing?: No Does the patient have difficulty seeing, even when wearing glasses/contacts?: No Does the patient have difficulty concentrating, remembering, or making decisions?: No  Permission Sought/Granted                  Emotional Assessment              Admission diagnosis:  Essential hypertension, malignant [I10] Shortness of breath [R06.02] Chronic hypokalemia [E87.6] Hyperlipidemia with target LDL less than 70 [E78.5] Pleural effusion on right [J90] Recurrent right  pleural effusion [J90] Patient Active Problem List   Diagnosis Date Noted   Recurrent right pleural effusion 09/04/2024   LAD (lymphadenopathy), mediastinal 08/14/2024   Pulmonary nodule 08/13/2024   Pleural effusion 08/13/2024   GIB (gastrointestinal bleeding) 08/08/2024   Pleural effusion on right 07/10/2024   LAFB (left anterior fascicular block) 05/13/2024   Right renal mass 08/24/2023   Microalbuminuria due to type 2 diabetes mellitus (HCC) 06/13/2023   Need for prophylactic vaccination and inoculation against varicella 06/12/2023   Tinea cruris 06/12/2023   BPH without obstruction/lower urinary tract symptoms 03/02/2021   Intrinsic eczema 93/79/7977   LSC (lichen simplex chronicus) 01/18/2021   Chronic gout due to renal impairment of multiple sites without tophus 06/01/2020   Elevated PSA 11/24/2019   Type II diabetes mellitus with manifestations (HCC) 08/17/2018   Atherosclerosis of aorta 09/26/2017   Drug-induced erectile dysfunction 07/19/2016   Pure hypertriglyceridemia 04/05/2016   Hyperlipidemia with target LDL less than 70 01/18/2016   OA (osteoarthritis) of knee 11/25/2013   PVD (peripheral vascular disease) 09/24/2013   Stage 3a chronic kidney disease (HCC) 05/26/2013   Antithrombin III  deficiency 05/16/2013   Anticoagulated on Coumadin  11/27/2012   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) 04/10/2012   Splenic  infarction 03/20/2012   CROHN'S DISEASE-LARGE INTESTINE 03/19/2009   Essential hypertension, malignant 03/02/2009   PCP:  Joshua Debby CROME, MD Pharmacy:   [phone] [place], [place] - [address] [place] [address] Phone: [phone] Fax: [phone]  [id number] [place], [place] - [address] AT [facility] [address] [place] [address] Phone: [phone] Fax: [phone]     Social Drivers of Health (SDOH) Social  History: SDOH Screenings   Food Insecurity: No Food Insecurity (09/04/2024)  Housing: Low Risk (09/04/2024)  Transportation Needs: No Transportation Needs (09/04/2024)  Utilities: Not At Risk (09/04/2024)  Alcohol Screen: Low Risk (06/11/2023)  Depression (PHQ2-9): Low Risk (08/25/2024)  Financial Resource Strain: Low Risk (06/11/2023)  Physical Activity: Sufficiently Active (06/12/2024)  Social Connections: Moderately Integrated (09/04/2024)  Stress: No Stress Concern Present (06/12/2024)  Tobacco Use: Medium Risk (09/04/2024)  Health Literacy: Adequate Health Literacy (06/12/2024)   SDOH Interventions:     Readmission Risk Interventions     No data to display

## 2024-09-04 NOTE — Consult Note (Signed)
 "                                                                                   Consultation Note Date: 09/04/2024   Patient Name: Rick Edwards  DOB: May 28, 1956  MRN: [id number]  Age / Sex: 69 y.o., male  PCP: Joshua Debby CROME, MD Referring Physician: Zella Katha HERO, MD  Reason for Consultation:  pain control  HPI/Patient Profile: 69 y.o. male  with past medical history of Crohn's disease s/p ileostomy and takedown, recent admission with bleeding duodenal ulcer and malignant pleural effusion, s/p biopsy of lung nodules- path positive for adenocarcinoma- possibly metastatic GI cancer, was scheduled for PET scan tomorrow and then to followup with Oncology for treatment recommendations, hard of hearing, presented to ED  09/04/2024 with complaints of severe R sided chest pain and increasing dyspnea. Workup reveals large R side pleural effusion. Palliative consulted for symptom management.    Primary Decision Maker PATIENT - designates his brother and sister as surrogates if needed  Discussion: Chart reviewed- notes by ED Dr. Dollie and attending Dr. Zella reviewed- note plan to admit and consult pulmonology. Chest xray personally reviewed- large R side pulm effusion, almost complete opacification of R lung.  On evaluation Rick Edwards was awake and alert. He was dyspneic at rest. He received one dose of 50 fentanyl  mcg with little relief. He is very hard of hearing, he reads lips- does not use ASL.  His brother Rick Edwards was at bedside. Rick Edwards reports patient lives alone, he is typically ambulatory and able to ambulate through his house. Rick Edwards and patient's sister help patient, bring him food.  Rick Edwards reports pain is severe in R chest- has been worsening over the last few days. Shortness of breath has been worsening as well.  We discussed his diagnosis of metastatic cancer. Rick Edwards inquired about patient getting PET scan while inpatient- I discussed that unfortunately, PET scans cannot be done  while inpatient.  Advanced care planning and code status was discussed due to advanced stage cancer. They note that goals continue to be to treat what is treatable and pursue workup and treatment plan for cancer.  Patient endorses desire for full resuscitative efforts and full scope of treatments. Would want ventilator if required to prevent cardiac arrest.        SUMMARY OF RECOMMENDATIONS -Newly diagnosed adenocarcinoma to lung, possibly metastatic, unclear primary, large right malignant pleural effusion- -Symptom management-  agree with hydromorphone  0.5mg -1mg  q2hr prn for pain and shortness of breath -Recommend thoracentesis, then can re-evaluate pain management regimen as current pain is likely acute related to large pleural effusion   Code Status/Advance Care Planning:   Code Status: Full Code    Prognosis:   Unable to determine  Discharge Planning: To Be Determined  Primary Diagnoses: Present on Admission:  Recurrent right pleural effusion   Review of Systems  Physical Exam  Vital Signs: BP (!) 140/77 (BP Location: Left Arm)   Pulse 94   Temp 98.2 F (36.8 C) (Oral)   Resp (!) 22   Ht 5' 9 (1.753 m)   Wt 92.5 kg   SpO2 100%   BMI 30.13 kg/m  Pain Scale:  Faces   Pain Score: 10-Worst pain ever   SpO2: SpO2: 100 % O2 Device:SpO2: 100 % O2 Flow Rate: .   IO: Intake/output summary: No intake or output data in the 24 hours ending 09/04/24 1304  LBM:   Baseline Weight: Weight: 92.5 kg Most recent weight: Weight: 92.5 kg       Thank you for this consult. Palliative medicine will continue to follow and assist as needed.   Signed by: Cassondra Stain, AGNP-C Palliative Medicine  Billing based on MDM: High  Problems Addressed: One or more chronic illnesses with severe exacerbation, progression, or side effects of treatment.    Risks: Decision regarding hospitalization or escalation of hospital care and Decision not to resuscitate or to de-escalate care  because of poor prognosis   Please contact Palliative Medicine Team phone at [phone] for questions and concerns.  For individual provider: See Amion               "

## 2024-09-04 NOTE — ED Triage Notes (Addendum)
 Patient BIB [facility] from home. 5 days ago began having generalized abdominal pain that moved to right upper abdomen. Painful to palpate. Has been vomiting, reports no blood. 2 weeks ago had a gastric ulcer removed. Distention to left lower side of his abdomen, patient said this is normal due to his previous abdominal surgery. Feels like he cannot take a deep breath.

## 2024-09-04 NOTE — Consult Note (Addendum)
 "  NAME:  Rick Edwards, MRN:  [id number], DOB:  1955/10/31, LOS: 0 ADMISSION DATE:  09/04/2024, CONSULTATION DATE:  09/04/24 REFERRING MD:  Rick Katha HERO, MD , CHIEF COMPLAINT:  Pleural Effusion   History of Present Illness:  Rick Edwards is a 69 y/o male with PMH significant for Antithrombin III  deficiency on coumadin , HTN, Crohns disease post ileostomy, and recent admission 08/07/24 - 08/23/2024 for upper GI bleed with malignant pleural effusion s/p biopsy w/ cytology revealing adenocarcinoma w/ concern for metastatic GI cancer and additional renal mass.   Patient is poor historian and brother was not present at bedside, majority HPI obtained from prior documentation. Patient was scheduled for PET scan 1/30 and oncology f/u for treatment planning. Patient presented today for right sided chest pain and dyspnea that has gradually worsened since his prior admission. CXR findings w/ a recurrent large right sided pleural effusion w/ complete opacification. Consult was placed for evaluation for pleural effusion management.   Prior moderate effusion on 1/3 was drained 100cc with vagal episode resulting in SBP in 70s. Cytology present for malignant cells, adenocarcinoma and molecular studies completed revealed NSCLC. Pertinent  Medical History    Past Medical History:  Diagnosis Date   Anemia    anemia thrombocytopenia, saw Hematology 2011, can not r/o myeloproliferative d/c   Antithrombin III  deficiency 05/16/2013   On coumadin  for this   Blood clot in vein    in right leg   Chronic kidney disease 05/27/2013   ACUTE RENAL FAILURE    Crohn's disease (HCC)    tx. Imuran    GI bleed 6/11   w/ normal EGD 6/11, 3 PRBCs    Hearing loss    wears hearing aid left ear; birthed with hearing loss   HOH (hard of hearing) 05/16/2013   Hypercalcemia    Hyperkalemia 05/27/2013   Hypertension    Ileostomy in place [facility]) 04-11-13   04-18-13 ileostomy to be taken down.   Perianal abscess 2001   s/p  right hemicolectomy, and drainage of retroperitoneal abcess 2003   Peripheral vascular disease    Transfusion history    last 8'13      Significant Hospital Events: Including procedures, antibiotic start and stop dates in addition to other pertinent events   1/29 admit for SOB, for thoracentesis of right pleural effusion  Interim History / Subjective:  Patient lying in bed, reporting right sided intermittent pain and shortness of breath.   Objective   Blood pressure (!) 140/88, pulse 96, temperature 98.1 F (36.7 C), temperature source Oral, resp. rate 18, height 5' 9 (1.753 m), weight 92.5 kg, SpO2 98%.       No intake or output data in the 24 hours ending 09/04/24 1353 Filed Weights   09/04/24 0912  Weight: 92.5 kg    Examination: General: ill appearing, awake, poor historian HENT: moist mucous membranes, normocephalic, trachea midline Lungs: Tachypnea, dyspnea with exertion, Right lung sounds diminished, left lung CTA Cardiovascular: S1S2 appreciated, warm skin, no edema, no murmurs noted. Abdomen: rounded, soft, nontender Extremities: well perfused, MSK without obvious abnormalities Neuro: Oriented x3   Resolved Hospital Problem list     Assessment & Plan:   Pleural Effusion 2/2 primary adenocarcinoma of the lung Renal nodule c/f RCC Patient found to have malignant pleural effusion during admission 1/3 s/p lung nodule biopsy with cytology findings of unknown primary adenocarcinoma. Patient also has additional concern for GI mets & renal mass. F/u was scheduled with Oncology and PET scan for  1/30. Prior thoracentesis for moderate pleural effusion completed on 1/3 was drained 100cc with vagal episode with SBP 70s. Molecular pathology revealed TPS 60% indicative of non-small cell lung cancer. Rick Edwards for symptom management and relief - Defer cytology, obtained from prior recent report - Can defer CT scan given PET scan scheduled for tomorrow - F/u post thora CXR - IS  & pulmonary toileting  Remainder of care per primary. Pulmonary will continue to follow  Labs   CBC: Recent Labs  Lab 09/04/24 1026  WBC 10.5  HGB 12.2*  HCT 37.5*  MCV 92.6  PLT 513*    Basic Metabolic Panel: Recent Labs  Lab 09/04/24 1026  NA 138  K 4.5  CL 100  CO2 26  GLUCOSE 146*  BUN 18  CREATININE 1.06  CALCIUM  10.1   GFR: Estimated Creatinine Clearance: 77 mL/min (by C-G formula based on SCr of 1.06 mg/dL). Recent Labs  Lab 09/04/24 1026  WBC 10.5    Liver Function Tests: Recent Labs  Lab 09/04/24 1026  AST 19  ALT 20  ALKPHOS 108  BILITOT 0.8  PROT 7.4  ALBUMIN  3.9   Recent Labs  Lab 09/04/24 1026  LIPASE 27   No results for input(s): AMMONIA in the last 168 hours.  ABG    Component Value Date/Time   PHART 7.506 (H) 05/17/2013 0430   PCO2ART 36.9 05/17/2013 0430   PO2ART 78.4 (L) 05/17/2013 0430   HCO3 29.0 (H) 05/17/2013 0430   TCO2 30.1 05/17/2013 0430   O2SAT 95.9 05/17/2013 0430     Coagulation Profile: No results for input(s): INR, PROTIME in the last 168 hours.  Cardiac Enzymes: No results for input(s): CKTOTAL, CKMB, CKMBINDEX, TROPONINI in the last 168 hours.  HbA1C: Hgb A1c MFr Bld  Date/Time Value Ref Range Status  05/13/2024 10:09 AM 6.2 4.6 - 6.5 % Final    Comment:    Glycemic Control Guidelines for People with Diabetes:Non Diabetic:  <6%Goal of Therapy: <7%Additional Action Suggested:  >8%   06/12/2023 10:17 AM 5.9 4.6 - 6.5 % Final    Comment:    Glycemic Control Guidelines for People with Diabetes:Non Diabetic:  <6%Goal of Therapy: <7%Additional Action Suggested:  >8%     CBG: No results for input(s): GLUCAP in the last 168 hours.  Review of Systems:   As above   Past Medical History:  He,  has a past medical history of Anemia, Antithrombin III  deficiency (05/16/2013), Blood clot in vein, Chronic kidney disease (05/27/2013), Crohn's disease (HCC), GI bleed (6/11), Hearing loss, HOH (hard  of hearing) (05/16/2013), Hypercalcemia, Hyperkalemia (05/27/2013), Hypertension, Ileostomy in place [facility]) (04-11-13), Perianal abscess (2001), Peripheral vascular disease, and Transfusion history.   Surgical History:   Past Surgical History:  Procedure Laterality Date   Anal fistulotomy  2002   BRONCHIAL NEEDLE ASPIRATION BIOPSY  08/14/2024   Procedure: BRONCHOSCOPY, WITH NEEDLE ASPIRATION BIOPSY;  Surgeon: Zaida Zola SAILOR, MD;  Location: [facility];  Service: Pulmonary;;   CRYOTHERAPY  08/14/2024   Procedure: CRYOTHERAPY;  Surgeon: Zaida Zola SAILOR, MD;  Location: [facility];  Service: Pulmonary;;   EMBOLECTOMY  03/12/2012   Procedure: EMBOLECTOMY;  Surgeon: Lonni GORMAN Blade, MD;  Location: [facility] OR;  Service: Vascular;  Laterality: Right;  Right popliteal embolectomy with vein patch angioplasty, right posterior tibial embolectomy with vein patch angioplasty    ESOPHAGOGASTRODUODENOSCOPY N/A 12/12/2023   Procedure: EGD (ESOPHAGOGASTRODUODENOSCOPY);  Surgeon: Burnette Fallow, MD;  Location: [facility];  Service: Gastroenterology;  Laterality: N/A;  ESOPHAGOGASTRODUODENOSCOPY N/A 08/11/2024   Procedure: EGD (ESOPHAGOGASTRODUODENOSCOPY);  Surgeon: Dianna Specking, MD;  Location: [facility] ENDOSCOPY;  Service: Gastroenterology;  Laterality: N/A;   HEMICOLECTOMY  08/29/2001   perforated abscess   HEMOSTASIS CLIP PLACEMENT  08/11/2024   Procedure: CONTROL OF HEMORRHAGE, GI TRACT, ENDOSCOPIC, BY HEMOSTATIC POWDER APPLICATION;  Surgeon: Dianna Specking, MD;  Location: [facility] ENDOSCOPY;  Service: Gastroenterology;;   ILEOSTOMY CLOSURE N/A 04/18/2013   Procedure: ILEOSTOMY TAKEDOWN;  Surgeon: Morene ONEIDA Olives, MD;  Location: [facility] ORS;  Service: General;  Laterality: N/A;   INTRAOPERATIVE ARTERIOGRAM  03/12/2012   Procedure: INTRA OPERATIVE ARTERIOGRAM;  Surgeon: Lonni GORMAN Blade, MD;  Location: [facility] OR;  Service: Vascular;  Laterality: Right;   PARTIAL COLECTOMY  03/11/2012   Procedure: PARTIAL COLECTOMY;   Surgeon: Morene ONEIDA Olives, MD;  Location: [facility] ORS;  Service: General;  Laterality: N/A;  subtotal colectomy transverse and left colon    TRANSMETATARSAL AMPUTATION  05/10/2012   right   UPPER ESOPHAGEAL ENDOSCOPIC ULTRASOUND (EUS) N/A 12/12/2023   Procedure: UPPER ESOPHAGEAL ENDOSCOPIC ULTRASOUND (EUS);  Surgeon: Burnette Fallow, MD;  Location: [facility];  Service: Gastroenterology;  Laterality: N/A;   VIDEO BRONCHOSCOPY WITH ENDOBRONCHIAL NAVIGATION Left 08/14/2024   Procedure: VIDEO BRONCHOSCOPY WITH ENDOBRONCHIAL NAVIGATION;  Surgeon: Zaida Zola SAILOR, MD;  Location: [facility];  Service: Pulmonary;  Laterality: Left;   VIDEO BRONCHOSCOPY WITH ENDOBRONCHIAL ULTRASOUND  08/14/2024   Procedure: BRONCHOSCOPY, WITH EBUS;  Surgeon: Zaida Zola SAILOR, MD;  Location: [facility];  Service: Pulmonary;;   VIDEO BRONCHOSCOPY WITH RADIAL ENDOBRONCHIAL ULTRASOUND  08/14/2024   Procedure: VIDEO BRONCHOSCOPY WITH RADIAL ENDOBRONCHIAL ULTRASOUND;  Surgeon: Zaida Zola SAILOR, MD;  Location: [facility];  Service: Pulmonary;;     Social History:   reports that he quit smoking about 13 years ago. His smoking use included cigarettes. He started smoking about 22 years ago. He has a 2.3 pack-year smoking history. He has never used smokeless tobacco. He reports that he does not currently use drugs after having used the following drugs: Marijuana. He reports that he does not drink alcohol.   Family History:  His family history includes Heart disease in his father; Hyperlipidemia in his father and mother; Hypertension in his father, mother, and another family member; Stroke in an other family member. There is no history of Coronary artery disease, Diabetes, Colon cancer, Prostate cancer, or Kidney failure.   Allergies Allergies[1]   Home Medications  Prior to Admission medications  Medication Sig Start Date End Date Taking? Authorizing Provider  albuterol  (VENTOLIN  HFA) 108 (90 Base) MCG/ACT inhaler Inhale 1-2 puffs  into the lungs every 6 (six) hours as needed for wheezing or shortness of breath. 07/23/24   Reddick, Johnathan B, NP  allopurinol  (ZYLOPRIM ) 300 MG tablet TAKE 1 TABLET BY MOUTH EVERY DAY 07/21/24   Joshua Debby CROME, MD  amLODipine  (NORVASC ) 10 MG tablet TAKE 1 TABLET BY MOUTH EVERYDAY AT BEDTIME 07/21/24   Joshua Debby CROME, MD  atorvastatin  (LIPITOR) 40 MG tablet TAKE 1 TABLET BY MOUTH EVERY DAY 07/11/24   Joshua Debby CROME, MD  carvedilol  (COREG ) 25 MG tablet TAKE 1 TABLET (25 MG TOTAL) BY MOUTH TWICE A DAY WITH MEALS 08/11/24   Joshua Debby CROME, MD  doxazosin  (CARDURA ) 4 MG tablet Take 1 tablet (4 mg total) by mouth at bedtime. 05/26/24   Joshua Debby CROME, MD  omega-3 acid ethyl esters (LOVAZA ) 1 g capsule Take 2 g by mouth 2 (two) times daily. Patient not taking: Reported on 08/25/2024  [provider]  oxyCODONE  (ROXICODONE ) 5 MG immediate release tablet Take 1 tablet (5 mg total) by mouth every 8 (eight) hours as needed for severe pain (pain score 7-10). 08/28/24 08/28/25  Joshua Debby CROME, MD  spironolactone  (ALDACTONE ) 25 MG tablet Take 1 tablet (25 mg total) by mouth daily. 10/26/23   Joshua Debby CROME, MD  warfarin (COUMADIN ) 5 MG tablet TAKE 1 TABLET BY MOUTH DAILY ON MONDAY, WEDNESDAY, AND FRIDAY. TAKE 1 and 1/2 TABLETS ON ALL OTHER DAYS OR AS DIRECTED BY CLINIC. 08/23/24   Dino Antu, MD     Critical care time: n/a    Alveria DELENA Shoulder, NP [facility] @TODAYSDATE @ 3:06 PM  Please see [email or web address] for pager details  From 7A-7P. If no response, please call [phone] After hours, please call Elink: [phone]         [1]  Allergies Allergen Reactions   Olmesartan      Hx of severe AKI requiring HD on ACE per renal note, hx of AKI and hypotension with olmesartan  07/2023   Mesalamine Rash    REACTION: Rash   "

## 2024-09-05 ENCOUNTER — Encounter (HOSPITAL_COMMUNITY): Admission: RE | Admit: 2024-09-05 | Source: Ambulatory Visit

## 2024-09-05 ENCOUNTER — Inpatient Hospital Stay (HOSPITAL_COMMUNITY)

## 2024-09-05 DIAGNOSIS — J910 Malignant pleural effusion: Secondary | ICD-10-CM | POA: Diagnosis not present

## 2024-09-05 DIAGNOSIS — C3490 Malignant neoplasm of unspecified part of unspecified bronchus or lung: Secondary | ICD-10-CM

## 2024-09-05 DIAGNOSIS — R0902 Hypoxemia: Secondary | ICD-10-CM

## 2024-09-05 DIAGNOSIS — J90 Pleural effusion, not elsewhere classified: Secondary | ICD-10-CM | POA: Diagnosis not present

## 2024-09-05 LAB — CBC
HCT: 37.1 % — ABNORMAL LOW (ref 39.0–52.0)
HCT: 35.2 % — ABNORMAL LOW (ref 39.0–52.0)
Hemoglobin: 11.7 g/dL — ABNORMAL LOW (ref 13.0–17.0)
Hemoglobin: 11.6 g/dL — ABNORMAL LOW (ref 13.0–17.0)
MCH: 29.6 pg (ref 26.0–34.0)
MCH: 30.4 pg (ref 26.0–34.0)
MCHC: 31.5 g/dL (ref 30.0–36.0)
MCHC: 33 g/dL (ref 30.0–36.0)
MCV: 93.9 fL (ref 80.0–100.0)
MCV: 92.1 fL (ref 80.0–100.0)
Platelets: 498 10*3/uL — ABNORMAL HIGH (ref 150–400)
Platelets: 470 10*3/uL — ABNORMAL HIGH (ref 150–400)
RBC: 3.95 MIL/uL — ABNORMAL LOW (ref 4.22–5.81)
RBC: 3.82 MIL/uL — ABNORMAL LOW (ref 4.22–5.81)
RDW: 14.1 % (ref 11.5–15.5)
RDW: 14 % (ref 11.5–15.5)
WBC: 11.6 10*3/uL — ABNORMAL HIGH (ref 4.0–10.5)
WBC: 13 10*3/uL — ABNORMAL HIGH (ref 4.0–10.5)
nRBC: 0 % (ref 0.0–0.2)
nRBC: 0 % (ref 0.0–0.2)

## 2024-09-05 LAB — BASIC METABOLIC PANEL WITH GFR
Anion gap: 8 (ref 5–15)
BUN: 22 mg/dL (ref 8–23)
CO2: 28 mmol/L (ref 22–32)
Calcium: 9.8 mg/dL (ref 8.9–10.3)
Chloride: 101 mmol/L (ref 98–111)
Creatinine, Ser: 0.92 mg/dL (ref 0.61–1.24)
GFR, Estimated: >60 mL/min
Glucose, Bld: 142 mg/dL — ABNORMAL HIGH (ref 70–99)
Potassium: 5 mmol/L (ref 3.5–5.1)
Sodium: 137 mmol/L (ref 135–145)

## 2024-09-05 LAB — LD, BODY FLUID (OTHER): LD, Body Fluid: 1131 [IU]/L

## 2024-09-05 LAB — PROTIME-INR
INR: 4.2 (ref 0.8–1.2)
Prothrombin Time: 41.9 s — ABNORMAL HIGH (ref 11.4–15.2)

## 2024-09-05 LAB — PROTEIN, BODY FLUID (OTHER): Total Protein, Body Fluid Other: 4.5 g/dL

## 2024-09-05 LAB — GLUCOSE, BODY FLUID OTHER: Glucose, Body Fluid Other: 42 mg/dL

## 2024-09-05 LAB — PATHOLOGIST SMEAR REVIEW

## 2024-09-05 MED ORDER — ALLOPURINOL 300 MG PO TABS
300.0000 mg | ORAL_TABLET | Freq: Every day | ORAL | Status: DC
  Administered 2024-09-05 – 2024-09-07 (×3): 300 mg via ORAL
  Filled 2024-09-05 (×3): qty 1

## 2024-09-05 MED ORDER — PHYTONADIONE 5 MG PO TABS
5.0000 mg | ORAL_TABLET | Freq: Once | ORAL | Status: AC
  Administered 2024-09-05: 5 mg via ORAL
  Filled 2024-09-05: qty 1

## 2024-09-05 MED ORDER — CARMEX CLASSIC LIP BALM EX OINT
1.0000 | TOPICAL_OINTMENT | CUTANEOUS | Status: AC | PRN
  Administered 2024-09-05: 1 via TOPICAL
  Filled 2024-09-05: qty 10

## 2024-09-05 MED ORDER — LIDOCAINE HCL (PF) 1 % IJ SOLN
10.0000 mL | Freq: Once | INTRAMUSCULAR | Status: AC
  Filled 2024-09-05: qty 10

## 2024-09-05 MED ORDER — SODIUM CHLORIDE 0.9% FLUSH
10.0000 mL | Freq: Four times a day (QID) | INTRAVENOUS | Status: DC
  Administered 2024-09-05 – 2024-09-11 (×23): 10 mL via INTRAPLEURAL

## 2024-09-05 MED ORDER — LIDOCAINE HCL 1 % IJ SOLN
INTRAMUSCULAR | Status: AC
  Filled 2024-09-05: qty 20

## 2024-09-05 NOTE — Progress Notes (Signed)
 MD made of critical lab results. No new orders at this time.Maintain current plan of care

## 2024-09-05 NOTE — Progress Notes (Signed)
 "  NAME:  Rick Edwards, MRN:  [id number], DOB:  1956-05-29, LOS: 1 ADMISSION DATE:  09/04/2024, CONSULTATION DATE:  09/04/24 REFERRING MD:  Zella Katha HERO, MD , CHIEF COMPLAINT:  Pleural Effusion   History of Present Illness:  Rick Edwards is a 69 y/o male with PMH significant for Antithrombin III  deficiency on coumadin , HTN, Crohns disease post ileostomy, and recent admission 08/07/24 - 08/23/2024 for upper GI bleed with malignant pleural effusion s/p biopsy w/ cytology revealing adenocarcinoma w/ concern for metastatic GI cancer and additional renal mass.   Patient is poor historian and brother was not present at bedside, majority HPI obtained from prior documentation. Patient was scheduled for PET scan 1/30 and oncology f/u for treatment planning. Patient presented today for right sided chest pain and dyspnea that has gradually worsened since his prior admission. CXR findings w/ a recurrent large right sided pleural effusion w/ complete opacification. Consult was placed for evaluation for pleural effusion management.   Prior moderate effusion on 1/3 was drained 100cc with vagal episode resulting in SBP in 70s. Cytology present for malignant cells, adenocarcinoma and molecular studies completed revealed NSCLC. Pertinent  Medical History    Past Medical History:  Diagnosis Date   Anemia    anemia thrombocytopenia, saw Hematology 2011, can not r/o myeloproliferative d/c   Antithrombin III  deficiency 05/16/2013   On coumadin  for this   Blood clot in vein    in right leg   Chronic kidney disease 05/27/2013   ACUTE RENAL FAILURE    Crohn's disease (HCC)    tx. Imuran    GI bleed 6/11   w/ normal EGD 6/11, 3 PRBCs    Hearing loss    wears hearing aid left ear; birthed with hearing loss   HOH (hard of hearing) 05/16/2013   Hypercalcemia    Hyperkalemia 05/27/2013   Hypertension    Ileostomy in place [facility]) 04-11-13   04-18-13 ileostomy to be taken down.   Perianal abscess 2001   s/p  right hemicolectomy, and drainage of retroperitoneal abcess 2003   Peripheral vascular disease    Transfusion history    last 8'13      Significant Hospital Events: Including procedures, antibiotic start and stop dates in addition to other pertinent events   1/29 admit for SOB; underwent 900 mL thoracentesis of right pleural effusion which yielded dark sanguinous fluid  Interim History / Subjective:  CXR minimally changed after yesterday's thoracentesis; INR 4.2 (from 5.7 y/day) so placement of 14 Fr tube initially deferred given relatively stable breathing (SpO2 100% on 2 L [place] and non-distressed). However, patient developed acute respiratory failure throughout day resulting in need to place chest tube emergently (bleeding risk outweighed by risk of respiratory failure). 2.3 L of sanguinous output drained in 20-30 minutes post-procedure. Significant improvement in work of breathing post-procedure.  Objective   Blood pressure 124/77, pulse 95, temperature 97.9 F (36.6 C), temperature source Oral, resp. rate (!) 26, height 5' 9 (1.753 m), weight 92.5 kg, SpO2 100%.        Intake/Output Summary (Last 24 hours) at 09/05/2024 1452 Last data filed at 09/05/2024 1350 Gross per 24 hour  Intake 420 ml  Output 200 ml  Net 220 ml   Filed Weights   09/04/24 0912  Weight: 92.5 kg    Examination: General: ill appearing, awake, poor historian HENT: moist mucous membranes, normocephalic, trachea midline Lungs: Tachypnea, Right lung sounds diminished, left lung CTA Cardiovascular: S1S2 appreciated, warm skin, no edema, no murmurs  noted. Abdomen: rounded, soft, nontender Extremities: well perfused, MSK without obvious abnormalities Neuro: Oriented x3  R mid-axillary 14 Fr pigtail chest tube present with tidaling; no air leak, sanguinous output in [place]. Water seal.  Resolved Hospital Problem list     Assessment & Plan:   Malignant Pleural Effusion 2/2 primary adenocarcinoma of the  lung Renal nodule c/f RCC Patient found to have malignant pleural effusion during admission 1/3 s/p lung nodule biopsy with cytology findings of unknown primary adenocarcinoma. Patient also has additional concern for GI mets & renal mass. F/u was scheduled with Oncology and PET scan for 1/30. Prior thoracentesis for moderate pleural effusion completed on 1/3 was drained 100cc with vagal episode with SBP 70s. Molecular pathology revealed TPS 60% indicative of non-small cell lung cancer. S/p additional 900 mL sanguinous drainage via thoracentesis on 1/29 and now 14 Fr chest tube placement on 1/30. - Defer cytology, obtained from prior recent report. - PET scan anticipated. - Clamp chest tube if >\= 2.5 L output in drain. However, if patient develops recurrent respiratory distress / increased work of breathing, OK to unclamp chest tube at that time. - Flush chest tube with 10 mL toward patient and 10 mL toward drain every 6 hours. - Repeat CXR in AM. - CBC ordered for 1800 hrs out of abundance of caution given elevated INR pre-procedure. Did not reverse VKA / coumadin  due to patient's known antithrombin III  deficiency (with VTE risk compounded further by known malignancy).  Remainder of care per primary. Pulmonary will continue to follow.  Labs   CBC: Recent Labs  Lab 09/04/24 1026 09/04/24 1739 09/05/24 0649  WBC 10.5 11.7* 11.6*  HGB 12.2* 11.5* 11.7*  HCT 37.5* 34.8* 37.1*  MCV 92.6 91.8 93.9  PLT 513* 498* 498*    Basic Metabolic Panel: Recent Labs  Lab 09/04/24 1026 09/05/24 0649  NA 138 137  K 4.5 5.0  CL 100 101  CO2 26 28  GLUCOSE 146* 142*  BUN 18 22  CREATININE 1.06 0.92  CALCIUM  10.1 9.8   GFR: Estimated Creatinine Clearance: 88.7 mL/min (by C-G formula based on SCr of 0.92 mg/dL). Recent Labs  Lab 09/04/24 1026 09/04/24 1739 09/05/24 0649  WBC 10.5 11.7* 11.6*    Liver Function Tests: Recent Labs  Lab 09/04/24 1026 09/04/24 1624  AST 19  --   ALT 20   --   ALKPHOS 108  --   BILITOT 0.8  --   PROT 7.4 6.9  ALBUMIN  3.9  --    Recent Labs  Lab 09/04/24 1026  LIPASE 27   No results for input(s): AMMONIA in the last 168 hours.  ABG    Component Value Date/Time   PHART 7.506 (H) 05/17/2013 0430   PCO2ART 36.9 05/17/2013 0430   PO2ART 78.4 (L) 05/17/2013 0430   HCO3 29.0 (H) 05/17/2013 0430   TCO2 30.1 05/17/2013 0430   O2SAT 95.9 05/17/2013 0430     Coagulation Profile: Recent Labs  Lab 09/04/24 1210 09/05/24 0649  INR 5.7* 4.2*    Cardiac Enzymes: No results for input(s): CKTOTAL, CKMB, CKMBINDEX, TROPONINI in the last 168 hours.  HbA1C: Hgb A1c MFr Bld  Date/Time Value Ref Range Status  05/13/2024 10:09 AM 6.2 4.6 - 6.5 % Final    Comment:    Glycemic Control Guidelines for People with Diabetes:Non Diabetic:  <6%Goal of Therapy: <7%Additional Action Suggested:  >8%   06/12/2023 10:17 AM 5.9 4.6 - 6.5 % Final    Comment:  Glycemic Control Guidelines for People with Diabetes:Non Diabetic:  <6%Goal of Therapy: <7%Additional Action Suggested:  >8%     CBG: No results for input(s): GLUCAP in the last 168 hours.  Review of Systems:   As above   Past Medical History:  He,  has a past medical history of Anemia, Antithrombin III  deficiency (05/16/2013), Blood clot in vein, Chronic kidney disease (05/27/2013), Crohn's disease (HCC), GI bleed (6/11), Hearing loss, HOH (hard of hearing) (05/16/2013), Hypercalcemia, Hyperkalemia (05/27/2013), Hypertension, Ileostomy in place [facility]) (04-11-13), Perianal abscess (2001), Peripheral vascular disease, and Transfusion history.   Surgical History:   Past Surgical History:  Procedure Laterality Date   Anal fistulotomy  2002   BRONCHIAL NEEDLE ASPIRATION BIOPSY  08/14/2024   Procedure: BRONCHOSCOPY, WITH NEEDLE ASPIRATION BIOPSY;  Surgeon: Zaida Zola SAILOR, MD;  Location: [facility] ENDOSCOPY;  Service: Pulmonary;;   CRYOTHERAPY  08/14/2024   Procedure: CRYOTHERAPY;  Surgeon:  Zaida Zola SAILOR, MD;  Location: [facility] ENDOSCOPY;  Service: Pulmonary;;   EMBOLECTOMY  03/12/2012   Procedure: EMBOLECTOMY;  Surgeon: Lonni GORMAN Blade, MD;  Location: [facility] OR;  Service: Vascular;  Laterality: Right;  Right popliteal embolectomy with vein patch angioplasty, right posterior tibial embolectomy with vein patch angioplasty    ESOPHAGOGASTRODUODENOSCOPY N/A 12/12/2023   Procedure: EGD (ESOPHAGOGASTRODUODENOSCOPY);  Surgeon: Burnette Fallow, MD;  Location: [facility];  Service: Gastroenterology;  Laterality: N/A;   ESOPHAGOGASTRODUODENOSCOPY N/A 08/11/2024   Procedure: EGD (ESOPHAGOGASTRODUODENOSCOPY);  Surgeon: Dianna Specking, MD;  Location: [facility] ENDOSCOPY;  Service: Gastroenterology;  Laterality: N/A;   HEMICOLECTOMY  08/29/2001   perforated abscess   HEMOSTASIS CLIP PLACEMENT  08/11/2024   Procedure: CONTROL OF HEMORRHAGE, GI TRACT, ENDOSCOPIC, BY HEMOSTATIC POWDER APPLICATION;  Surgeon: Dianna Specking, MD;  Location[facility] ENDOSCOPY;  Service: Gastroenterology;;   ILEOSTOMY CLOSURE N/A 04/18/2013   Procedure: ILEOSTOMY TAKEDOWN;  Surgeon: Morene ONEIDA Olives, MD;  Location: [facility] ORS;  Service: General;  Laterality: N/A;   INTRAOPERATIVE ARTERIOGRAM  03/12/2012   Procedure: INTRA OPERATIVE ARTERIOGRAM;  Surgeon: Lonni GORMAN Blade, MD;  Location: [facility] OR;  Service: Vascular;  Laterality: Right;   PARTIAL COLECTOMY  03/11/2012   Procedure: PARTIAL COLECTOMY;  Surgeon: Morene ONEIDA Olives, MD;  Location: [facility] ORS;  Service: General;  Laterality: N/A;  subtotal colectomy transverse and left colon    TRANSMETATARSAL AMPUTATION  05/10/2012   right   UPPER ESOPHAGEAL ENDOSCOPIC ULTRASOUND (EUS) N/A 12/12/2023   Procedure: UPPER ESOPHAGEAL ENDOSCOPIC ULTRASOUND (EUS);  Surgeon: Burnette Fallow, MD;  Location: [facility];  Service: Gastroenterology;  Laterality: N/A;   VIDEO BRONCHOSCOPY WITH ENDOBRONCHIAL NAVIGATION Left 08/14/2024   Procedure: VIDEO BRONCHOSCOPY WITH ENDOBRONCHIAL NAVIGATION;  Surgeon:  Zaida Zola SAILOR, MD;  Location: [facility] ENDOSCOPY;  Service: Pulmonary;  Laterality: Left;   VIDEO BRONCHOSCOPY WITH ENDOBRONCHIAL ULTRASOUND  08/14/2024   Procedure: BRONCHOSCOPY, WITH EBUS;  Surgeon: Zaida Zola SAILOR, MD;  Location: [facility] ENDOSCOPY;  Service: Pulmonary;;   VIDEO BRONCHOSCOPY WITH RADIAL ENDOBRONCHIAL ULTRASOUND  08/14/2024   Procedure: VIDEO BRONCHOSCOPY WITH RADIAL ENDOBRONCHIAL ULTRASOUND;  Surgeon: Zaida Zola SAILOR, MD;  Location: [facility] ENDOSCOPY;  Service: Pulmonary;;     Social History:   reports that he quit smoking about 13 years ago. His smoking use included cigarettes. He started smoking about 22 years ago. He has a 2.3 pack-year smoking history. He has never used smokeless tobacco. He reports that he does not currently use drugs after having used the following drugs: Marijuana. He reports that he does not drink alcohol.   Family History:  His family  history includes Heart disease in his father; Hyperlipidemia in his father and mother; Hypertension in his father, mother, and another family member; Stroke in an other family member. There is no history of Coronary artery disease, Diabetes, Colon cancer, Prostate cancer, or Kidney failure.   Allergies Allergies[1]   Home Medications  Prior to Admission medications  Medication Sig Start Date End Date Taking? Authorizing Provider  albuterol  (VENTOLIN  HFA) 108 (90 Base) MCG/ACT inhaler Inhale 1-2 puffs into the lungs every 6 (six) hours as needed for wheezing or shortness of breath. 07/23/24   Reddick, Johnathan B, NP  allopurinol  (ZYLOPRIM ) 300 MG tablet TAKE 1 TABLET BY MOUTH EVERY DAY 07/21/24   Joshua Debby CROME, MD  amLODipine  (NORVASC ) 10 MG tablet TAKE 1 TABLET BY MOUTH EVERYDAY AT BEDTIME 07/21/24   Joshua Debby CROME, MD  atorvastatin  (LIPITOR) 40 MG tablet TAKE 1 TABLET BY MOUTH EVERY DAY 07/11/24   Joshua Debby CROME, MD  carvedilol  (COREG ) 25 MG tablet TAKE 1 TABLET (25 MG TOTAL) BY MOUTH TWICE A DAY WITH MEALS 08/11/24   Joshua Debby CROME, MD  doxazosin  (CARDURA ) 4 MG tablet Take 1 tablet (4 mg total) by mouth at bedtime. 05/26/24   Joshua Debby CROME, MD  omega-3 acid ethyl esters (LOVAZA ) 1 g capsule Take 2 g by mouth 2 (two) times daily. Patient not taking: Reported on 08/25/2024    [provider]  oxyCODONE  (ROXICODONE ) 5 MG immediate release tablet Take 1 tablet (5 mg total) by mouth every 8 (eight) hours as needed for severe pain (pain score 7-10). 08/28/24 08/28/25  Joshua Debby CROME, MD  spironolactone  (ALDACTONE ) 25 MG tablet Take 1 tablet (25 mg total) by mouth daily. 10/26/23   Joshua Debby CROME, MD  warfarin (COUMADIN ) 5 MG tablet TAKE 1 TABLET BY MOUTH DAILY ON MONDAY, WEDNESDAY, AND FRIDAY. TAKE 1 and 1/2 TABLETS ON ALL OTHER DAYS OR AS DIRECTED BY CLINIC. 08/23/24   Dino Antu, MD         MDM Level: High  Lamar Dales, MD Pulmonary, Critical Care & Sleep Medicine [facility]  7a-7p: For contact information, see AMION. If no response to pager, call PCCM Consults pager. After 7p: Call E-Link.       [1]  Allergies Allergen Reactions   Olmesartan      Hx of severe AKI requiring HD on ACE per renal note, hx of AKI and hypotension with olmesartan  07/2023   Mesalamine Rash    REACTION: Rash   "

## 2024-09-05 NOTE — Evaluation (Signed)
 Patient Details Name: Rick Edwards MRN: [id number] DOB: Jan 25, 1956  Cancelled Treatment: Evaluation canceled due to patient not medically stable. Nursing reported patient is dizzy and SOB. Will re attempt when patient medically stable.  Kristeen Sar, PT, DPT 09/05/24 1:21 PM

## 2024-09-05 NOTE — Progress Notes (Signed)
 Patient at this time post chest tube insertion to the right side resting comfortably. Resp Pattern now unlabored with no accessory muscle use. Per MD instructions Chest tube is clamped at this time. Monitor Patient closely for sings of resp distress.

## 2024-09-05 NOTE — Progress Notes (Signed)
 Assisted Stretch MD in chest tube placement. Consent completed and signed by brother, Christopher. Vitals in flowsheet.   2450 mL sanguinous/serosanguinous fluid in approximately 20 minutes after insertion.   Chest tube clamped per Stretch MD. Leave clamped at this time unless patient develops increased shortness of breath

## 2024-09-05 NOTE — Progress Notes (Signed)
 " PROGRESS NOTE    ABDULWAHAB DEMELO  FMW:[id number] DOB: [birth year] DOA: 09/04/2024 PCP: Joshua Debby CROME, MD    Brief Narrative:   Rick Edwards is a 69 y.o. 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  Recently discharged following thoracentesis.  Since arriving back home his right-sided chest pain and shortness of breath has gradually worsened.  Worse with exertion.  Associated with mild nausea but no vomiting, no fevers.  Additionally with generalized weakness and difficulty performing his ADLs.  No further evidence of GI bleed, dark tarry stools.  Has been compliant with his home medication including Coumadin .  In the ED, temperature 97.3 F, HR 110, RR 25, BP 117/77, O2 to 100% on room air.  WBC 10.5, Huma 12.2, platelet count 513.  Sodium 138, potassium 4.5, chloride 100, CO2 26, glucose 146, BUN 18, creat 1.06.  AST 19, ALT 20, total bilirubin 0.8.  INR 5.7.  Urinalysis unrevealing.  Chest x-ray with large right pleural effusion with complete opacification of the right hemithorax, no acute radiographic abnormality in the left lung.  TRH consulted for admission for recurrent large right malignant pleural effusion.  Assessment & Plan:   Large right malignant pleural effusion, recurrent Patient presents with right-sided chest pain, shortness of breath, generalized weakness.  Recently diagnosed with adenocarcinoma of unknown clear primary following most recent hospitalization with thoracentesis and bronchoscopy.  Imaging previously noted left upper lobe nodule, right renal lesion concerning for RCC versus GI source.  Patient is afebrile.   Chest x-ray with large right pleural effusion with complete opacification of the right hemithorax.  PCCM was consulted and underwent thoracentesis on 09/04/2024 with removal of 900 mL of turbid fluid -- PCCM following, appreciate assistance -- Repeat chest x-ray this morning shows continued complete opacification of the right hemithorax with large pleural effusion -- Discussed with pulmonology, plan for chest tube placement -- Continue supplemental oxygen, maintain SpO2 greater than 92%  Adenocarcinoma, unclear primary Concern for lung versus GI versus renal etiology.  Follows with medical oncology, Dr. Federico. He had biopsy done via bronchoscopy with pulmonology at [facility] earlier this month. He was scheduled for outpatient PET scan 09/05/2024; but due to recurrent hospitalization will need to reschedule.  Antithrombin III  deficiency Supratherapeutic INR Chronically on Coumadin .  INR elevated 5.7 on admission.  Received 2.5 mg p.o. vitamin K . -- INR 5.7>4.2 -- Will await PCCM's recommendations for additional vitamin K  versus FFP -- INR daily  Recent upper GI bleed 2/2 duodenal ulcer Recent EGD 08/11/2024 with findings of oozing cratered duodenal ulcer s/p hemostatic spray with resolution of bleeding. -- Hgb 12.2>11.5>11.7; stable -- CBC daily  HTN -- Amlodipine  10 mg p.o. daily -- Carvedilol  25 g p.o. twice daily -- Spironolactone  20 mg p.o. daily  HLD Peripheral vascular disease -- Atorvastatin  40 mams p.o. daily  CKD stage IIIa  History of Crohn's disease  DM2 Diet controlled at baseline.  Last hemoglobin A1c 6.2%.   DVT prophylaxis: SCDs Start: 09/04/24 1242    Code Status: Full Code Family Communication: Updated brother present at bedside this morning  Disposition Plan:  Level of care: Progressive Status is: Inpatient Remains inpatient appropriate because: Needs chest tube  Consultants:  PCCM  Procedures:  Right thoracentesis, PCCM 1/29  Antimicrobials:   None   Subjective: Patient seen examined bedside, lying in bed.  Brother present at bedside.  Continues with shortness of breath.  Underwent thoracentesis yesterday with 1 L removed.  Repeat chest x-ray this morning shows persistent opacification of right hemithorax consistent with large pleural effusion.  Discussed with PCCM.  No other complaints or concerns at this time.  Denies headache, no fever/chills/night sweats, no nausea/vomiting/diarrhea, no focal weakness, no chest pain, no palpitations, no abdominal pain, no paresthesias.  No acute events overnight per nurse staff.  Objective: Vitals:   09/04/24 1721 09/04/24 2020 09/05/24 0521 09/05/24 0735  BP: 119/83 132/83 126/82 (!) 135/93  Pulse: 93 82 80 82  Resp: 19 16 17 20   Temp: 98.6 F (37 C) 97.8 F (36.6 C) (!) 97.4 F (36.3 C)   TempSrc: Oral     SpO2: 100% 99% 98% 100%  Weight:      Height:        Intake/Output Summary (Last 24 hours) at 09/05/2024 1107 Last data filed at 09/05/2024 0700 Gross per 24 hour  Intake 200 ml  Output 200 ml  Net 0 ml   Filed Weights   09/04/24 0912  Weight: 92.5 kg    Examination:  Physical Exam: GEN: Mild respiratory distress, alert and oriented x 3, chronically ill appearance, appears older than stated age HEENT: NCAT, PERRL, EOMI, sclera clear, MMM PULM: Absent breath sounds right hemithorax, CTA left chest without wheezing/crackles, slightly increased respiratory effort without accessory muscle use, on 2 L nasal cannula with SpO2 100% at rest CV: RRR w/o M/G/R GI: abd soft, NTND, + BS MSK: no peripheral edema, moves all extremities independently NEURO: No focal neurological deficit PSYCH: normal mood/affect Integumentary: No concerning rashes/lesions/wounds noted on exposed skin surfaces    Data Reviewed: I have personally reviewed following labs and imaging studies  CBC: Recent Labs  Lab 09/04/24 1026 09/04/24 1739 09/05/24 0649  WBC 10.5 11.7* 11.6*  HGB 12.2*  11.5* 11.7*  HCT 37.5* 34.8* 37.1*  MCV 92.6 91.8 93.9  PLT 513* 498* 498*   Basic Metabolic Panel: Recent Labs  Lab 09/04/24 1026 09/05/24 0649  NA 138 137  K 4.5 5.0  CL 100 101  CO2 26 28  GLUCOSE 146* 142*  BUN 18 22  CREATININE 1.06 0.92  CALCIUM  10.1 9.8   GFR: Estimated Creatinine Clearance: 88.7 mL/min (by C-G formula based on SCr of 0.92 mg/dL). Liver Function Tests: Recent Labs  Lab 09/04/24 1026 09/04/24 1624  AST 19  --   ALT 20  --   ALKPHOS 108  --   BILITOT 0.8  --   PROT 7.4 6.9  ALBUMIN  3.9  --    Recent Labs  Lab 09/04/24 1026  LIPASE 27   No results for input(s): AMMONIA in the last 168 hours. Coagulation Profile: Recent Labs  Lab 09/04/24 1210 09/05/24 0649  INR 5.7* 4.2*   Cardiac Enzymes: No results for input(s): CKTOTAL, CKMB, CKMBINDEX, TROPONINI in the last 168 hours. BNP (last 3 results) Recent Labs    08/08/24 1227  PROBNP <50.0   HbA1C: No results for input(s): HGBA1C in the last 72 hours. CBG: No results for input(s): GLUCAP in the last 168 hours. Lipid Profile: No results for input(s): CHOL, HDL, LDLCALC, TRIG, CHOLHDL, LDLDIRECT in the last 72 hours. Thyroid  Function Tests: No results for input(s): TSH, T4TOTAL, FREET4, T3FREE, THYROIDAB in the last 72 hours.  Anemia Panel: No results for input(s): VITAMINB12, FOLATE, FERRITIN, TIBC, IRON, RETICCTPCT in the last 72 hours. Sepsis Labs: No results for input(s): PROCALCITON, LATICACIDVEN in the last 168 hours.  Recent Results (from the past 240 hours)  Body fluid culture w Gram Stain     Status: None (Preliminary result)   Collection Time: 09/04/24  3:49 PM   Specimen: Pleural Fluid  Result Value Ref Range Status   Specimen Description   Final    PLEURAL Performed at [facility], [address], [place], [place] [address]    Special Requests   Final    NONE Performed at [facility], [address] [place], [place] [address]    Gram Stain NO WBC SEEN NO ORGANISMS SEEN   Final   Culture   Final    NO GROWTH < 12 HOURS Performed at [facility], [address], [place], [place] [address]    Report Status PENDING  Incomplete         Radiology Studies: DG CHEST PORT 1 VIEW Result Date: 09/05/2024 EXAM: 1 VIEW(S) XRAY OF THE CHEST 09/05/2024 08:19:00 AM COMPARISON: 09/04/2024 CLINICAL HISTORY: Pleural effusion. FINDINGS: LUNGS AND PLEURA: Progressive opacification of right hemithorax with no definite aeration of right lung, likely due to enlarging pleural effusion and compressive atelectasis. No pneumothorax. HEART AND MEDIASTINUM: Enlargement of the left main pulmonary artery compatible with pulmonary artery hypertension. No acute abnormality of the cardiac silhouette. BONES AND SOFT TISSUES: No acute osseous abnormality. IMPRESSION: 1. Progressive opacification of right hemithorax with no definite aeration of the right lung, likely due to pleural effusion and compressive atelectasis. Electronically signed by: Waddell Calk MD 09/05/2024 10:45 AM EST RP Workstation: [id number]   DG CHEST PORT 1 VIEW Result Date: 09/04/2024 EXAM: 1 VIEW XRAY OF THE CHEST 09/04/2024 04:02:00 PM COMPARISON: 09/04/2024 CLINICAL HISTORY: Status post thoracentesis. left upper lobe lung cancer FINDINGS: LUNGS AND PLEURA: Near complete opacification of the right hemithorax with only a minuscule amount of aerated lung in the right apex. Continued vascular prominence of the left hilum. No pneumothorax. HEART AND MEDIASTINUM: No acute abnormality of the cardiac and mediastinal silhouettes. BONES AND SOFT TISSUES: No acute osseous abnormality. IMPRESSION: 1. Near complete opacification of the right hemithorax with only a minuscule amount of aerated lung in the right apex. 2. Continued vascular prominence of the left hilum. Electronically signed by: Ryan Salvage MD 09/04/2024 05:00 PM  EST RP Workstation: [id number]   DG Chest Portable 1 View Result Date: 09/04/2024 EXAM: 1 VIEW(S) XRAY OF THE CHEST 09/04/2024 09:27:55 AM COMPARISON: 08/18/2024 CLINICAL HISTORY: Shortness of breath. FINDINGS: LUNGS AND PLEURA: Large right pleural effusion now resulting in complete opacification of the right hemithorax. No acute radiographic abnormality in the left lung. No pneumothorax. HEART AND MEDIASTINUM: The mediastinum is partially obscured. Subtle rightward mediastinal shift. BONES AND SOFT TISSUES: No acute osseous abnormality. IMPRESSION: 1. Large right pleural effusion resulting in complete opacification of the right hemithorax. 2. No acute radiographic abnormality in the left lung. Electronically signed by: Donnice Mania MD 09/04/2024 10:15 AM EST RP Workstation: [id number]        Scheduled Meds:  allopurinol   300 mg Oral Daily   amLODipine   10 mg Oral QHS   atorvastatin   40 mg Oral QHS   carvedilol   25 mg Oral BID WC   doxazosin   4 mg Oral QHS   spironolactone   25 mg Oral Daily   Continuous Infusions:   LOS: 1  day    Time spent: 53 minutes spent on 09/05/2024 caring for this patient face-to-face including chart review, ordering labs/tests, documenting, discussion with nursing staff, consultants, updating family and interview/physical exam    Camellia PARAS Anajulia Leyendecker, DO [facility] Available via Epic secure chat 7am-7pm After these hours, please refer to coverage provider listed on [email or web address] 09/05/2024, 11:07 AM   "

## 2024-09-05 NOTE — Progress Notes (Signed)
 OT Cancellation Note  Patient Details Name: Rick Edwards MRN: [id number] DOB: Sep 02, 1955   Cancelled Treatment:    Reason Eval/Treat Not Completed: Patient not medically ready Spoke with nursing who reports that pt is not medically ready to participate in therapy today. Currently experiencing SOB and dizziness when attempting to sit up. Will continue to follow patient and complete OT evaluation when pt is medically able.   Leita Howell, OTR/L,CBIS  Supplemental OT - [facility] and [facility] Secure Chat Preferred   09/05/2024, 12:21 PM

## 2024-09-05 NOTE — Progress Notes (Signed)
 "  Palliative Medicine Inpatient Follow Up Note   HPI: 69 year old male with past medical history significant of Crohn's disease status post ileostomy and takedown, recent admission with bleeding duodenal ulcer and malignant pleural effusion, status post biopsy of lung nodules, pathology positive for adenocarcinoma, possibly metastatic GI cancer, was scheduled for PET scan tomorrow and then to follow-up with oncology for treatment recommendations.  He is hard of hearing, presented to the emergency room on 09/04/2024 with complaints of severe right sided chest pain and increasing work of breathing.  Workup reveals large right-sided pleural effusion.  Palliative consulted for symptom management.  This is hospital day #: 1 Today's Discussion 09/05/2024  I have reviewed medical records including:  EPIC notes: Reviewed recent PMT note consult note from 09/05/24, ongoing symptom management, recommended thoracentesis in the setting of large right side pleural effusion, likely contributing to patient's pain. Reviewed Hospitalist Dr. Austria progress note from 09/05/24 detailing treatment plan mainly for large right malignant pleural effusion. Underwent thoracentesis 09/04/24, removed 900 ml of turbid fluid. Reviewed PT and OT notes from 09/05/24, activity not completed, patient unable to participate due to acute illness. Reviewed to track clinical course and prognostication.   MAR: Reviewed MAR over the last 24 hours, patient took his meds as scheduled.  Reviewed PRN meds received over the last 24 hours. Received PRN Dilaudid  x 4 doses, zofran  x1 dose, PRN trazodone  x1 dose. Reviewed to assess needs for medication adjustment to optimize comfort.  Available advanced directives in ACP: None on file.  Labs: Reviewed labs from 09/05/24: WBC slightly up to 11.6, INR elevated at 4.2, PT at 41.9   Reviewed to assist with prescribing and prognostication  Met with patient and his brother Christopher at bedside today to review  symptoms, GOC and current plan of care.   Patient resting well. Not in any signs of distress, although noted for minimal SOB with exertion. Denies pain. Reports pain is more felt with movement, pain to his right chest wall. He reports great relief from pain and discomfort after thoracentesis done yesterday. He reports good night rest after a long and tiring day yesterday.   Goals of care reviewed. Pain control is important for patient as he relates this to quality of life. So far his pain is well controlled as of this time and does not warrant adjustment. I shared that we will continue to assess and make changes as needed to optimize his comfort. Reviewed code status, patient desires to continue with full code. Also desires full scope of treatment at this time to allow his body every chance for recovery and to an extent possible. Patient and his brother verbalized openness to cancer-directed therapies.   I provided education on proper pain management/symptom control. Reminded patient of the availability of PRN pain medications.   I discussed and educated both patient and his brother the nature of lung cancer and recurrent pleural effusion.   Created space and opportunity for patient to explore thoughts feelings and fears regarding current medical situation.  Patient and her family face treatment option decisions, advanced directive decisions and anticipatory care needs.   I shared that given the serious nature of his lung adenocarcinoma, likely metastatic, that it is important to have continued conversation with family and their  medical providers regarding overall plan of care and treatment options, ensuring decisions are within the context of the patients values and GOCs. The patient was encouraged to revisit these discussions regularly, as preferences and circumstances may change over time.  Questions and concerns addressed.   Palliative Support Provided.   Objective Assessment: Vital  Signs Vitals:   09/05/24 1203 09/05/24 1418  BP: 129/69 124/77  Pulse: 86 95  Resp: (!) 24 (!) 26  Temp: 97.9 F (36.6 C)   SpO2: 100% 100%    Intake/Output Summary (Last 24 hours) at 09/05/2024 1437 Last data filed at 09/05/2024 1350 Gross per 24 hour  Intake 420 ml  Output 200 ml  Net 220 ml   Last Weight  Most recent update: 09/04/2024  9:12 AM    Weight  92.5 kg (204 lb)             Physical Exam Vitals and nursing note reviewed.  Constitutional:      Appearance: He is ill-appearing.     Comments: Tired-appearing  HENT:     Head: Normocephalic and atraumatic.     Ears:     Comments: Hard of hearing, aids present.     Mouth/Throat:     Mouth: Mucous membranes are moist.  Cardiovascular:     Rate and Rhythm: Normal rate.  Pulmonary:     Effort: Pulmonary effort is normal.  Abdominal:     General: Abdomen is flat. Bowel sounds are normal.     Palpations: Abdomen is soft.  Genitourinary:    Rectum: Guaiac result positive.     Comments: Note for presence of externa urinary catheter.  Skin:    General: Skin is warm.  Neurological:     General: No focal deficit present.     Mental Status: He is alert. Mental status is at baseline.  Psychiatric:        Mood and Affect: Mood normal.     SUMMARY OF RECOMMENDATIONS    # Complex medical decision-making/goals of care  Code Status: Maintain full code status Patient is open to cancer-directed therapies as appropriate Goal of care is medical stabilization to the extent this is possible Denies uncontrolled symptoms, pain is under control with current regimen, continue as planned. Consider adjusting dose if pain is unrelieved. Palliative medicine team will continue to follow.   # Symptom management Continue with dilaudid  0.5 to 1 mg Q2 PRN Continue with Tylenol  PRN for mild pain Continue with Oxycodone  10mg  Q4 PRN for moderate pain Patient reported good sleep last night. Continue with Trazodone  PRN for  insomnia Palliative medicine is available to assist as needed.    # Psychosocial support Continue to provide psycho-social and emotional support to patient and family.  # Discharge planning  To be determined.  # Prognosis  Prognosis is poor given patient's high disease burden.   # Treatment plan discussed with: Patient, patient's brother Christopher, nursing and medical team.   I personally spent a total of 35 minutes in the care of the patient today including preparing to see the patient, getting/reviewing separately obtained history, performing a medically appropriate exam/evaluation, documenting clinical information in the EHR, and coordinating care.    Thank you for allowing us  to participate in the care of Ellaree LELON Kirks   ______________________________________________________________________________________ Kathlyne Bolder NP-C [place] Palliative Medicine Team Team Cell Phone: [phone] Please utilize secure chat with additional questions, if there is no response within 30 minutes please call the above phone number  Palliative Medicine Team providers are available by phone from 7am to 7pm daily and can be reached through the team cell phone.  Should this patient require assistance outside of these hours, please call the patient's attending physician.     "

## 2024-09-05 NOTE — Procedures (Signed)
 Insertion of Chest Tube Procedure Note  Rick Edwards  [id number]  29-Oct-1955  Date:09/05/24  Time:4:01 PM    Provider Performing: Lamar PARAS Kenaz Olafson   Procedure: Pleural Catheter Insertion w/ Imaging Guidance ([id number])  Indication(s) Malignant Effusion  Consent Risks of the procedure as well as the alternatives and risks of each were explained to the patient and/or caregiver.  Consent for the procedure was obtained and is signed in the bedside chart  Anesthesia Topical only with 1% lidocaine  (10 mL)   Time Out Verified patient identification, verified procedure, site/side was marked, verified correct patient position, special equipment/implants available, medications/allergies/relevant history reviewed, required imaging and test results available.   Sterile Technique Maximal sterile technique including full sterile barrier drape, hand hygiene, sterile gown, sterile gloves, mask, hair covering, sterile ultrasound probe cover (if used).   Procedure Description Ultrasound used to identify appropriate pleural anatomy for placement and overlying skin marked. Pre-procedure ultrasound revealed grainy/cellular appearance of pleural fluid as well as areas of loculation. Area of placement cleaned and draped in sterile fashion.  A 14 French pigtail pleural catheter was placed into the right pleural space using Seldinger technique. Appropriate return of sanguinous / sero-sanguinous fluid was obtained.  The tube was connected to [facility]rium drain and placed to water seal with drainage of 2 L of sanguinous / serosanguinous drainage over the ensuing 20 minutes. Patient's vitals were monitored throughout and remained stable. Breathing was noted to significantly improve following 2 L of bloody pleural fluid drainage.  Complications/Tolerance None; patient tolerated the procedure well. Chest X-ray is ordered to verify placement.   EBL Minimal procedural blood loss, but pleural fluid was bloody (as  anticipated based on prior procedure note from 09/04/2024).  Specimen(s) none (previously analysed).  Lamar PARAS Dales, MD

## 2024-09-06 ENCOUNTER — Inpatient Hospital Stay (HOSPITAL_COMMUNITY)

## 2024-09-06 DIAGNOSIS — J90 Pleural effusion, not elsewhere classified: Secondary | ICD-10-CM | POA: Diagnosis not present

## 2024-09-06 LAB — BASIC METABOLIC PANEL WITH GFR
Anion gap: 6 (ref 5–15)
BUN: 21 mg/dL (ref 8–23)
CO2: 30 mmol/L (ref 22–32)
Calcium: 8.9 mg/dL (ref 8.9–10.3)
Chloride: 98 mmol/L (ref 98–111)
Creatinine, Ser: 0.99 mg/dL (ref 0.61–1.24)
GFR, Estimated: >60 mL/min
Glucose, Bld: 139 mg/dL — ABNORMAL HIGH (ref 70–99)
Potassium: 4.7 mmol/L (ref 3.5–5.1)
Sodium: 134 mmol/L — ABNORMAL LOW (ref 135–145)

## 2024-09-06 LAB — CBC
HCT: 33.8 % — ABNORMAL LOW (ref 39.0–52.0)
Hemoglobin: 10.6 g/dL — ABNORMAL LOW (ref 13.0–17.0)
MCH: 29.7 pg (ref 26.0–34.0)
MCHC: 31.4 g/dL (ref 30.0–36.0)
MCV: 94.7 fL (ref 80.0–100.0)
Platelets: 479 10*3/uL — ABNORMAL HIGH (ref 150–400)
RBC: 3.57 MIL/uL — ABNORMAL LOW (ref 4.22–5.81)
RDW: 13.9 % (ref 11.5–15.5)
WBC: 12 10*3/uL — ABNORMAL HIGH (ref 4.0–10.5)
nRBC: 0 % (ref 0.0–0.2)

## 2024-09-06 LAB — PROTIME-INR
INR: 2 — ABNORMAL HIGH (ref 0.8–1.2)
Prothrombin Time: 23.6 s — ABNORMAL HIGH (ref 11.4–15.2)

## 2024-09-06 MED ORDER — ACETAMINOPHEN 500 MG PO TABS: 1000.0000 mg | ORAL_TABLET | Freq: Three times a day (TID) | ORAL | Status: DC

## 2024-09-06 MED ORDER — ACETAMINOPHEN 650 MG RE SUPP: 650.0000 mg | Freq: Three times a day (TID) | RECTAL | Status: DC

## 2024-09-06 MED ORDER — ENOXAPARIN SODIUM 100 MG/ML IJ SOSY
90.0000 mg | PREFILLED_SYRINGE | Freq: Two times a day (BID) | INTRAMUSCULAR | Status: DC
  Administered 2024-09-06 – 2024-09-07 (×3): 90 mg via SUBCUTANEOUS
  Filled 2024-09-06 (×3): qty 1

## 2024-09-06 MED ORDER — ACETAMINOPHEN 500 MG PO TABS
1000.0000 mg | ORAL_TABLET | Freq: Three times a day (TID) | ORAL | Status: AC
  Administered 2024-09-06 – 2024-09-12 (×20): 1000 mg via ORAL
  Filled 2024-09-06 (×20): qty 2

## 2024-09-06 NOTE — Progress Notes (Signed)
 Dr. Olena requested chest tube to be unclamped this morning, of output from chest tube this shift. MD notified of output and chest tube to remain unclamped per MD.

## 2024-09-06 NOTE — Evaluation (Signed)
 Occupational Therapy Evaluation Patient Details Name: Rick Edwards MRN: [id number] DOB: 1956-05-30 Today's Date: 09/06/2024   History of Present Illness   Rick Edwards is a 69 y.o. 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  Recently discharged following thoracentesis.  Since arriving back home his right-sided chest pain and shortness of breath has gradually worsened.  Worse with exertion.  Associated with mild nausea but no vomiting, no fevers.  Additionally with generalized weakness and difficulty performing his ADLs.  No further evidence of GI bleed, dark tarry stools.  Has been compliant with his home medication including Coumadin .Recently diagnosed with adenocarcinoma of unknown clear primary following most recent hospitalization with thoracentesis and bronchoscopy.  Imaging previously noted left upper lobe nodule, right renal lesion concerning for RCC versus GI source.  Patient is afebrile.  Chest x-ray with large right pleural effusion with complete opacification of the right hemithorax.  PCCM was consulted and underwent thoracentesis on 09/04/2024 with removal of 900 mL of turbid fluid     Clinical Impressions Pt admitted with weakness and pain. Pt currently with functional limitations due to the deficits listed below (see OT Problem List).  Pt will benefit from acute skilled OT to increase their safety and independence with ADL and functional mobility for ADL to facilitate discharge.   Depending on progress- pt will benefit from ST SNF- but family may opt to take him home. At this time would need A with all ADL activity as pt  fatigues so quickly.  Pt very pleasant with good participation.   Pt played football in HS at [employer].  Seemed proud of this when brother mentioned it     If plan is discharge home, recommend the following:   A little help with bathing/dressing/bathroom;Assistance with cooking/housework;Help with stairs or ramp for entrance     Functional Status Assessment   Patient has had a recent decline in their functional status and demonstrates the ability to make significant improvements in function in a reasonable and predictable amount of time.       Precautions/Restrictions   Precautions Precaution/Restrictions Comments: chest tube, oxygen     Mobility Bed Mobility Overal bed mobility: Needs Assistance Bed Mobility: Supine to Sit     Supine to sit: HOB elevated, Mod assist, +2 for safety/equipment          Transfers Overall transfer level: Needs assistance Equipment used: Rolling walker (2 wheels) Transfers: Sit to/from Stand Sit to Stand: Min assist, +2 physical assistance, +2 safety/equipment, From elevated surface     Step pivot transfers: Min assist            Balance Overall balance assessment: Needs assistance   Sitting balance-Leahy Scale: Good     Standing balance support: No upper extremity supported, Single extremity supported, During functional activity Standing balance-Leahy Scale: Fair                             ADL either performed or assessed with clinical judgement   ADL Overall ADL's : Needs assistance/impaired Eating/Feeding: Maximal assistance;Bed level Eating/Feeding Details (indicate cue type and reason): HOB raised Grooming: Moderate assistance;Bed  level Grooming Details (indicate cue type and reason): HOB raised. Upper Body Bathing: Moderate assistance;Sitting   Lower Body Bathing: Maximal assistance;+2 for safety/equipment;Sit to/from stand Lower Body Bathing Details (indicate cue type and reason):  quick sit to stand due to breathing- min A with initial sit to stand                       General ADL Comments: pts brother present and very helpful and caring. Pt sat EOB with OT and PT and did fatigue quickly.     Vision   Vision Assessment?: No apparent visual deficits            Pertinent Vitals/Pain Pain Assessment Pain Assessment: Faces Faces Pain Scale: Hurts whole lot Pain Location: R side of abdomen Pain Descriptors / Indicators: Discomfort, Sore, Grimacing Pain Intervention(s): Premedicated before session, Monitored during session, Repositioned     Extremity/Trunk Assessment Upper Extremity Assessment Upper Extremity Assessment: Generalized weakness (RUE with full ROM but with increased time due to pain)           Communication Communication Communication: Impaired Factors Affecting Communication: Hearing impaired   Cognition Arousal: Alert Behavior During Therapy: WFL for tasks assessed/performed Cognition: No apparent impairments                               Following commands: Intact       Cueing  General Comments   Cueing Techniques: Verbal cues              Home Living Family/patient expects to be discharged to:: Skilled nursing facility (family may opt to take him home depending on progress) Living Arrangements: Alone Available Help at Discharge: Family;Available 24 hours/day Type of Home: House Home Access: Stairs to enter [employer] of Steps: 4 Entrance Stairs-Rails: Right (pole) Home Layout: One level     Bathroom Shower/Tub: Chief Strategy Officer: Standard     Home Equipment: Agricultural Consultant (2 wheels)          Prior Functioning/Environment Prior Level of Function : Needs assist               ADLs Comments: a  friend helps him sponge bathes daily at 2 pm, pt would like to be able to shower, but cannot step over edge of tub    OT Problem List:     OT  Treatment/Interventions: Self-care/ADL training;DME and/or AE instruction;Therapeutic activities;Patient/family education;Balance training      OT Goals(Current goals can be found in the care plan section)   Acute Rehab OT Goals Patient Stated Goal: feel better Time For Goal Achievement: 09/20/24 ADL Goals Pt Will Perform Eating: (P) with set-up;sitting Pt Will Perform Grooming: (P) with min assist;sitting Pt Will Transfer to Toilet: (P) with supervision;bedside commode Pt Will Perform Toileting - Clothing Manipulation and hygiene: (P) with min assist   OT Frequency:       Co-evaluation PT/OT/SLP Co-Evaluation/Treatment: Yes Reason for Co-Treatment: For patient/therapist safety;Complexity of the patient's impairments (multi-system involvement)          AM-PAC OT 6 Clicks Daily Activity     Outcome Measure Help from another person eating meals?: A Little Help from another person taking care of personal grooming?: A Little Help from another person toileting, which includes using toliet, bedpan, or urinal?: A Lot Help from another person bathing (including washing, rinsing, drying)?: A Lot Help from another person to put  on and taking off regular upper body clothing?: A Lot Help from another person to put on and taking off regular lower body clothing?: A Lot 6 Click Score: 14   End of Session Equipment Utilized During Treatment: Rolling walker (2 wheels)  Activity Tolerance: Patient limited by fatigue Patient left: in bed;with call bell/phone within reach  OT Visit Diagnosis: Unsteadiness on feet (R26.81);Muscle weakness (generalized) (M62.81);Pain                Time: 1115-1145 OT Time Calculation (min): 30 min Charges:  OT Evaluation $OT Eval Moderate Complexity: 1 Mod    Yasuko Lapage, Norvel BIRCH 09/06/2024, 12:54 PM

## 2024-09-06 NOTE — Progress Notes (Addendum)
 " PROGRESS NOTE    Rick Edwards  FMW:[id number] DOB: July 18, 1956 DOA: 09/04/2024 PCP: Joshua Debby CROME, MD    Brief Narrative:   Rick Edwards is a 69 y.o. 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  Recently discharged following thoracentesis.  Since arriving back home his right-sided chest pain and shortness of breath has gradually worsened.  Worse with exertion.  Associated with mild nausea but no vomiting, no fevers.  Additionally with generalized weakness and difficulty performing his ADLs.  No further evidence of GI bleed, dark tarry stools.  Has been compliant with his home medication including Coumadin .  In the ED, temperature 97.3 F, HR 110, RR 25, BP 117/77, O2 to 100% on room air.  WBC 10.5, Huma 12.2, platelet count 513.  Sodium 138, potassium 4.5, chloride 100, CO2 26, glucose 146, BUN 18, creat 1.06.  AST 19, ALT 20, total bilirubin 0.8.  INR 5.7.  Urinalysis unrevealing.  Chest x-ray with large right pleural effusion with complete opacification of the right hemithorax, no acute radiographic abnormality in the left lung.  TRH consulted for admission for recurrent large right malignant pleural effusion.  Assessment & Plan:   Large right malignant pleural effusion, recurrent s/p chest tube Patient presents with right-sided chest pain, shortness of breath, generalized weakness.  Recently diagnosed with adenocarcinoma of unknown clear primary following most recent hospitalization with thoracentesis and bronchoscopy.  Imaging previously noted left upper lobe nodule, right renal lesion concerning for RCC versus GI source.  Patient is  afebrile.  Chest x-ray with large right pleural effusion with complete opacification of the right hemithorax.  PCCM was consulted and underwent thoracentesis on 09/04/2024 with removal of 900 mL of turbid fluid -- PCCM following, appreciate assistance -- Chest tube placement pulmonology 1/30 -- Chest tube output: 2.5L past 24h -- Repeat chest x-ray this morning shows improved aeration right upper lobe -- Continue supplemental oxygen, maintain SpO2 greater than 92% -- Chest x-ray in the a.m.  Adenocarcinoma, unclear primary Concern for lung versus GI versus renal etiology.  Follows with medical oncology, Dr. Federico. He had biopsy done via bronchoscopy with pulmonology at [facility] earlier this month. He was scheduled for outpatient PET scan 09/05/2024; but due to recurrent hospitalization will need to reschedule.  Antithrombin III  deficiency Supratherapeutic INR Chronically on Coumadin .  INR elevated 5.7 on admission.  Received 2.5 mg p.o. vitamin K  and 5mg  on 1/30 -- INR 5.7>4.2>2.0 -- Holding warfarin for now -- INR daily  Recent upper GI bleed 2/2 duodenal ulcer Recent EGD 08/11/2024 with findings of oozing cratered duodenal ulcer s/p hemostatic spray with resolution of bleeding. -- Hgb 12.2>11.5>11.710.6; stable -- CBC daily  HTN -- Amlodipine  10 mg p.o. daily -- Carvedilol  25 mg p.o. twice daily -- Spironolactone  25 mg p.o. daily  HLD Peripheral vascular disease -- Atorvastatin  40 mg p.o. daily  CKD stage IIIa -- Cr 1.06>0.92>0.99, stable  History of Crohn's disease Follows with [facility] gastroenterology outpatient; Dr. Elicia.  Not on immunosuppressants outpatient  DM2 Diet controlled at baseline.  Last hemoglobin A1c 6.2%.   DVT prophylaxis: SCDs Start: 09/04/24 1242    Code Status: Full Code Family Communication: Updated brother present at  bedside this morning  Disposition Plan:  Level of care: Progressive Status is: Inpatient Remains inpatient appropriate  because: Chest tube remains in place    Consultants:  PCCM  Procedures:  Right thoracentesis, PCCM 1/29 Chest tube, PCCM 1/30  Antimicrobials:  None   Subjective: Patient seen examined bedside, lying in bed.  Brother present at bedside.  Dyspnea improved following chest tube placement yesterday.  2.5 L of output since placement.  Fluid bloody in appearance, hemoglobin stable.  Chest x-ray shows improved aeration in right upper lobe. No other complaints or concerns at this time.  Denies headache, no fever/chills/night sweats, no nausea/vomiting/diarrhea, no focal weakness, no chest pain, no palpitations, no abdominal pain, no paresthesias.  No acute events overnight per nursing staff.  Objective: Vitals:   09/05/24 2208 09/06/24 0420 09/06/24 0609 09/06/24 0833  BP:  122/75 112/79 115/67  Pulse:  87 82 83  Resp:  (!) 25 (!) 24   Temp:  98.9 F (37.2 C) 98.2 F (36.8 C)   TempSrc:  Oral Oral   SpO2: 100% 99% 100%   Weight:      Height:        Intake/Output Summary (Last 24 hours) at 09/06/2024 1013 Last data filed at 09/06/2024 0854 Gross per 24 hour  Intake 250 ml  Output 2750 ml  Net -2500 ml   Filed Weights   09/04/24 0912  Weight: 92.5 kg    Examination:  Physical Exam: GEN: Mild respiratory distress, alert and oriented x 3, chronically ill appearance, appears older than stated age HEENT: NCAT, PERRL, EOMI, sclera clear, MMM PULM: Absent breath sounds right hemithorax, CTA left chest without wheezing/crackles, slightly increased respiratory effort without accessory muscle use, on 2 L nasal cannula with SpO2 100% at rest CV: RRR w/o M/G/R GI: abd soft, NTND, + BS MSK: no peripheral edema, moves all extremities independently NEURO: No focal neurological deficit PSYCH: normal mood/affect Integumentary: No concerning rashes/lesions/wounds noted on exposed skin surfaces    Data Reviewed: I have personally reviewed following labs and imaging  studies  CBC: Recent Labs  Lab 09/04/24 1026 09/04/24 1739 09/05/24 0649 09/05/24 1658 09/06/24 0538  WBC 10.5 11.7* 11.6* 13.0* 12.0*  HGB 12.2* 11.5* 11.7* 11.6* 10.6*  HCT 37.5* 34.8* 37.1* 35.2* 33.8*  MCV 92.6 91.8 93.9 92.1 94.7  PLT 513* 498* 498* 470* 479*   Basic Metabolic Panel: Recent Labs  Lab 09/04/24 1026 09/05/24 0649 09/06/24 0538  NA 138 137 134*  K 4.5 5.0 4.7  CL 100 101 98  CO2 26 28 30   GLUCOSE 146* 142* 139*  BUN 18 22 21   CREATININE 1.06 0.92 0.99  CALCIUM  10.1 9.8 8.9   GFR: Estimated Creatinine Clearance: 82.4 mL/min (by C-G formula based on SCr of 0.99 mg/dL). Liver Function Tests: Recent Labs  Lab 09/04/24 1026 09/04/24 1624  AST 19  --   ALT 20  --   ALKPHOS 108  --   BILITOT 0.8  --   PROT 7.4 6.9  ALBUMIN  3.9  --    Recent Labs  Lab 09/04/24 1026  LIPASE 27   No results for input(s): AMMONIA in the last 168 hours. Coagulation Profile: Recent Labs  Lab 09/04/24 1210 09/05/24 0649 09/06/24 0538  INR 5.7* 4.2* 2.0*   Cardiac Enzymes: No results for input(s): CKTOTAL, CKMB, CKMBINDEX, TROPONINI in the last 168 hours. BNP (last 3 results) Recent Labs    08/08/24 1227  PROBNP <50.0   HbA1C: No results for input(s): HGBA1C  in the last 72 hours. CBG: No results for input(s): GLUCAP in the last 168 hours. Lipid Profile: No results for input(s): CHOL, HDL, LDLCALC, TRIG, CHOLHDL, LDLDIRECT in the last 72 hours. Thyroid  Function Tests: No results for input(s): TSH, T4TOTAL, FREET4, T3FREE, THYROIDAB in the last 72 hours. Anemia Panel: No results for input(s): VITAMINB12, FOLATE, FERRITIN, TIBC, IRON, RETICCTPCT in the last 72 hours. Sepsis Labs: No results for input(s): PROCALCITON, LATICACIDVEN in the last 168 hours.  Recent Results (from the past 240 hours)  Body fluid culture w Gram Stain     Status: None (Preliminary result)   Collection Time: 09/04/24  3:49 PM    Specimen: Pleural Fluid  Result Value Ref Range Status   Specimen Description   Final    PLEURAL Performed at [facility], [address], [place], [place] [address]    Special Requests   Final    NONE Performed at [facility], [address], [place], [place] [address]    Gram Stain NO WBC SEEN NO ORGANISMS SEEN   Final   Culture   Final    NO GROWTH < 12 HOURS Performed at [facility], [address] [place], [place] [address]    Report Status PENDING  Incomplete         Radiology Studies: DG CHEST PORT 1 VIEW Result Date: 09/05/2024 CLINICAL DATA:  Encounter for chest tube placement. EXAM: PORTABLE CHEST 1 VIEW COMPARISON:  09/05/2024 FINDINGS: Pigtail chest tube placed into the right lower chest region. There is persistent opacification throughout the right hemithorax. Trachea remains midline. Stable fullness in the left hilum that could represent vascular structures. No definite pneumothorax. Cardiac silhouette is stable. IMPRESSION: Placement of right chest tube. Persistent opacification throughout the right hemithorax. Electronically Signed   By: Juliene Balder M.D.   On: 09/05/2024 16:39   DG CHEST PORT 1 VIEW Result Date: 09/05/2024 EXAM: 1 VIEW(S) XRAY OF THE CHEST 09/05/2024 08:19:00 AM COMPARISON: 09/04/2024 CLINICAL HISTORY: Pleural effusion. FINDINGS: LUNGS AND PLEURA: Progressive opacification of right hemithorax with no definite aeration of right lung, likely due to enlarging pleural effusion and compressive atelectasis. No pneumothorax. HEART AND MEDIASTINUM: Enlargement of the left main pulmonary artery compatible with pulmonary artery hypertension. No acute abnormality of the cardiac silhouette. BONES AND SOFT TISSUES: No acute osseous abnormality. IMPRESSION: 1. Progressive opacification of right hemithorax with no definite aeration of the right lung, likely due to pleural effusion and compressive atelectasis.  Electronically signed by: Waddell Calk MD 09/05/2024 10:45 AM EST RP Workstation: [id number]   DG CHEST PORT 1 VIEW Result Date: 09/04/2024 EXAM: 1 VIEW XRAY OF THE CHEST 09/04/2024 04:02:00 PM COMPARISON: 09/04/2024 CLINICAL HISTORY: Status post thoracentesis. left upper lobe lung cancer FINDINGS: LUNGS AND PLEURA: Near complete opacification of the right hemithorax with only a minuscule amount of aerated lung in the right apex. Continued vascular prominence of the left hilum. No pneumothorax. HEART AND MEDIASTINUM: No acute abnormality of the cardiac and mediastinal silhouettes. BONES AND SOFT TISSUES: No acute osseous abnormality. IMPRESSION: 1. Near complete opacification of the right hemithorax with only a minuscule amount of aerated lung in the right apex. 2. Continued vascular prominence of the left hilum. Electronically signed by: Ryan Salvage MD 09/04/2024 05:00 PM EST RP Workstation: [id number]        Scheduled Meds:  allopurinol   300 mg Oral Daily   amLODipine   10 mg Oral QHS   atorvastatin   40 mg Oral QHS  carvedilol   25 mg Oral BID WC   doxazosin   4 mg Oral QHS   sodium chloride  flush  10 mL Intrapleural Q6H   spironolactone   25 mg Oral Daily   Continuous Infusions:   LOS: 2 days    Time spent: 53 minutes spent on 09/06/2024 caring for this patient face-to-face including chart review, ordering labs/tests, documenting, discussion with nursing staff, consultants, updating family and interview/physical exam    Camellia PARAS Manford Sprong, DO [facility] Available via Epic secure chat 7am-7pm After these hours, please refer to coverage provider listed on [email or web address] 09/06/2024, 10:13 AM   "

## 2024-09-06 NOTE — Progress Notes (Signed)
 PT Note  Patient Details Name: Rick Edwards MRN: [id number] DOB: 04-21-1956  Eval performed with OT today., full write up to follow.  Pt still very limited with mobiulity due to breathing/ lung capacity. Sitting edge of bed was a challenge for him after about 5 minutes .    MILLICENT SALES 09/06/2024, 2:49 PM Sales, PT, MPT Acute Rehabilitation Services Office: [phone] If a weekend: secure chat groups: [facility] PT, [facility] OT, [facility] SLP 09/06/2024

## 2024-09-06 NOTE — Progress Notes (Signed)
 PHARMACY - ANTICOAGULATION CONSULT NOTE  Pharmacy Consult for Lovenox  Indication: ATIII def  Allergies[1]  Patient Measurements: Height: 5' 9 (175.3 cm) Weight: 92.5 kg (204 lb) IBW/kg (Calculated) : 70.7 HEPARIN  DW (KG): 89.6  Vital Signs: Temp: 98.2 F (36.8 C) (01/31 0609) Temp Source: Oral (01/31 0609) BP: 115/67 (01/31 0833) Pulse Rate: 83 (01/31 0833)  Labs: Recent Labs    09/04/24 1026 09/04/24 1210 09/04/24 1739 09/05/24 0649 09/05/24 1658 09/06/24 0538  HGB 12.2*  --    < > 11.7* 11.6* 10.6*  HCT 37.5*  --    < > 37.1* 35.2* 33.8*  PLT 513*  --    < > 498* 470* 479*  LABPROT  --  54.0*  --  41.9*  --  23.6*  INR  --  5.7*  --  4.2*  --  2.0*  CREATININE 1.06  --   --  0.92  --  0.99   < > = values in this interval not displayed.    Estimated Creatinine Clearance: 82.4 mL/min (by C-G formula based on SCr of 0.99 mg/dL).   Medical History: Past Medical History:  Diagnosis Date   Anemia    anemia thrombocytopenia, saw Hematology 2011, can not r/o myeloproliferative d/c   Antithrombin III  deficiency 05/16/2013   On coumadin  for this   Blood clot in vein    in right leg   Chronic kidney disease 05/27/2013   ACUTE RENAL FAILURE    Crohn's disease (HCC)    tx. Imuran    GI bleed 6/11   w/ normal EGD 6/11, 3 PRBCs    Hearing loss    wears hearing aid left ear; birthed with hearing loss   HOH (hard of hearing) 05/16/2013   Hypercalcemia    Hyperkalemia 05/27/2013   Hypertension    Ileostomy in place [facility]) 04-11-13   04-18-13 ileostomy to be taken down.   Perianal abscess 2001   s/p right hemicolectomy, and drainage of retroperitoneal abcess 2003   Peripheral vascular disease    Transfusion history    last 8'13    Assessment: AC/Heme: INR 5.7 on admit -Warfarin PTA for antithrombin III  deficiency - 1/29: Vit K 2.5 mg PO  - 1/30: Vit K 5mg  - Hgb 10.6 down,  - INR 4.2>>2, start Lovenox  for bridging  Goal of Therapy:  Anti-Xa level 0.6-1  units/ml 4hrs after LMWH dose given Monitor platelets by anticoagulation protocol: Yes   Plan:  Lovenox  90mg  SQ BID starting this PM 1/31when INR declines <2 Check CBC q48hr with recent GIB   Janneth Krasner Karoline Marina, PharmD, BCPS Clinical Staff Pharmacist  Marina Salines Stillinger 09/06/2024,10:55 AM      [1]  Allergies Allergen Reactions   Olmesartan      Hx of severe AKI requiring HD on ACE per renal note, hx of AKI and hypotension with olmesartan  07/2023   Mesalamine Rash    REACTION: Rash

## 2024-09-06 NOTE — Progress Notes (Signed)
 Latest xray done 09/06/2024 0536 showed that his right lung has expanded as compared prior to chest tube insertion but still shows pleural effusion. Chest tube right now is clamped and patient right now is not in distress and sleeping. Vitals were taken and O2 sat is 99% at 2LPM nasal cannula. RONAL Horns NP was made aware of latest xray.

## 2024-09-06 NOTE — Evaluation (Signed)
 Physical Therapy Evaluation Patient Details Name: Rick Edwards MRN: [id number] DOB: [birth year] Today's Date: 09/06/2024  History of Present Illness  69 yo admit with right sided chest pain and dyspnea , was scheduled for PET scan on 1/30 but came to ED on 1/29 instead. Finding during this admission with recurrent Right sided pleural effusion with complete opacification. 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 Brother is very supportive and present. Prior to previous admission pt was independent  Clinical Impression  Pt very pleasant and brother very supportive of his care. Pt was walking during last admission with PT >300 feet with no AD. However today very limited due to pain and also lung capacity and shortness of breath Pt tolerated sitting edge of bed for about 7-10 minutes, and 1 quick stand with RW at bedside for 5 seconds or less, then pt was totally wiped out. Pt's breathing is very challenged to support activity as this time. Will continue to follow while here. Patient will benefit from continued inpatient follow up therapy, <3 hours/day. , and pt's brother goal is to return home if possible.       If plan is discharge home, recommend the following: Assistance with cooking/housework;Assist for transportation;Help with stairs or ramp for entrance;A lot of help with bathing/dressing/bathroom;A lot of help with walking and/or transfers   Can travel by private vehicle   No    Equipment Recommendations Wheelchair (measurements PT) (may need WC if mobility doesn't progress)  Recommendations for Other Services       Functional Status Assessment Patient has had a recent decline in their functional status and demonstrates the ability to make significant  improvements in function in a reasonable and predictable amount of time.     Precautions / Restrictions Precautions Recall of Precautions/Restrictions: Intact Precaution/Restrictions Comments: chest tube, oxygen Restrictions Weight Bearing Restrictions Per Provider Order: No      Mobility  Bed Mobility Overal bed mobility: Needs Assistance Bed Mobility: Supine to Sit     Supine to sit: HOB elevated, Mod assist, +2 for safety/equipment     General bed mobility comments: HOB slightly elevated.    Transfers Overall transfer level: Needs assistance Equipment used: Rolling walker (2 wheels) Transfers: Sit to/from Stand Sit to Stand: Min assist, +2 physical assistance, From elevated surface           General transfer comment: pt stood at edge of bed just briefly 5 seconds and sat quickly due to fatigue and then laid down quickly as well due to shortness of breath    Ambulation/Gait Ambulation/Gait assistance:  (not able to tolerate today)                Stairs            Wheelchair Mobility     Tilt Bed    Modified Rankin (Stroke Patients Only)       Balance Overall balance assessment: Needs assistance   Sitting balance-Leahy Scale: Good     Standing balance support: No upper extremity supported, Single extremity supported, During functional activity Standing balance-Leahy Scale: Fair                               Pertinent Vitals/Pain Pain Assessment Faces Pain Scale: Hurts  whole lot Pain Location: R side of abdomen Pain Descriptors / Indicators: Discomfort, Sore, Grimacing    Home Living Family/patient expects to be discharged to:: Private residence (family may opt to take him home depending on progress) Living Arrangements: Alone Available Help at Discharge: Family;Available 24 hours/day (brother) Type of Home: House Home Access: Stairs to enter Entrance Stairs-Rails: Right (pole) Entrance Stairs-Number of Steps: 4    Home Layout: One level Home Equipment: Agricultural [employer] (2 wheels)      Prior Function Prior Level of Function : Needs assist               ADLs Comments: a  friend helps him sponge bathes daily at 2 pm, pt would like to be able to shower, but cannot step over edge of tub     Extremity/Trunk Assessment        Lower Extremity Assessment Lower Extremity Assessment: Generalized weakness (very limited with mobility and resistance due to great difficult with breathing)       Communication   Communication Communication: Impaired Factors Affecting Communication: Hearing impaired    Cognition Arousal: Alert Behavior During Therapy: WFL for tasks assessed/performed   PT - Cognitive impairments: Difficult to assess Difficult to assess due to: Impaired communication                     PT - Cognition Comments: Pt very HOH wears hearing aids, difficult to discern if pt has slowed processing or if this is related to hearing impairment Following commands: Intact       Cueing Cueing Techniques: Verbal cues     General Comments      Exercises     Assessment/Plan    PT Assessment Patient needs continued PT services  PT Problem List Decreased activity tolerance;Decreased mobility;Decreased knowledge of use of DME;Cardiopulmonary status limiting activity;Decreased strength       PT Treatment Interventions DME instruction;Gait training;Stair training;Functional mobility training;Therapeutic activities;Therapeutic exercise;Patient/family education    PT Goals (Current goals can be found in the Care Plan section)  Acute Rehab PT Goals Patient Stated Goal: Return Home PT Goal Formulation: With patient/family Time For Goal Achievement: 09/20/24 Potential to Achieve Goals: Fair    Frequency Min 2X/week     Co-evaluation PT/OT/SLP Co-Evaluation/Treatment: Yes Reason for Co-Treatment: For patient/therapist safety;Complexity of the patient's impairments  (multi-system involvement) PT goals addressed during session: Mobility/safety with mobility         AM-PAC PT 6 Clicks Mobility  Outcome Measure Help needed turning from your back to your side while in a flat bed without using bedrails?: A Lot Help needed moving from lying on your back to sitting on the side of a flat bed without using bedrails?: A Lot Help needed moving to and from a bed to a chair (including a wheelchair)?: A Lot Help needed standing up from a chair using your arms (e.g., wheelchair or bedside chair)?: A Lot Help needed to walk in hospital room?: A Lot Help needed climbing 3-5 steps with a railing? : A Lot 6 Click Score: 12    End of Session   Activity Tolerance: Patient limited by fatigue Patient left: in bed;with family/visitor present Nurse Communication: Mobility status PT Visit Diagnosis: Difficulty in walking, not elsewhere classified (R26.2);Muscle weakness (generalized) (M62.81)    Time: 8884-8851 PT Time Calculation (min) (ACUTE ONLY): 33 min   Charges:       PT General Charges $$ ACUTE PT VISIT: 1 Visit  Cloretta, PT, MPT Acute Rehabilitation Services Office: [phone] If a weekend: secure chat groups: [facility] PT, [facility] OT, [facility] SLP 09/06/2024   Abas Leicht 09/06/2024, 7:20 PM

## 2024-09-06 NOTE — Progress Notes (Addendum)
 "  Palliative Medicine Inpatient Follow Up Note   HPI: 69 year old male with past medical history significant of Crohn's disease status post ileostomy and takedown, recent admission with bleeding duodenal ulcer and malignant pleural effusion, status post biopsy of lung nodules, pathology positive for adenocarcinoma, possibly metastatic GI cancer, was scheduled for PET scan tomorrow and then to follow-up with oncology for treatment recommendations. He is hard of hearing, presented to the emergency room on 09/04/2024 with complaints of severe right sided chest pain and increasing work of breathing. Workup reveals large right-sided pleural effusion. Palliative consulted for symptom management.   09/05/2024: Pleural catheter insertion with imaging guidance into right pleural space.  Drained 2 L of serosanguineous fluid drainage.  Note that this is a 30-day readmission, patient has had 2 inpatient and 1 ED admissions in the last 6 months.  Most recent admission was from 08/17/2024 to 08/23/2024 due to GI bleeding, moderate right pleural effusion/adenocarcinoma of unknown primary with likely metastatic gastrointestinal malignancy.  This is hospital day #: 2 Today's Discussion 09/06/2024   I have reviewed medical records including:  EPIC notes: Reviewed pulmonologist Dr. Olena progress notes from 09/05/2024 detailing plan of care, right pleural catheter placed.  Nursing progress note reported patient failed better, respiratory pattern unlabored with no use of accessory muscle post chest tube insertion.  Reviewed hospitalist Dr. Austria progress notes from 09/05/2024 detailing plan of care mainly for large right malignant pleural effusion that is recurrent, and adenocarcinoma of unclear origin.  I also reviewed most recent PT and OT progress notes.  Reviewed to track clinical course and prognostication.  MAR: Reviewed PRN meds received over the last 24 hours.  Patient received as needed Dilaudid  1 mg x 5 doses  and as needed trazodone  x 1 dose.  Reviewed to assess needs for medication adjustment to optimize comfort.  Available advanced directives in ACP: None  Visited patient at bedside, his brother was present and supportive.  Patient was resting comfortably and in no acute distress.  I noted the presence of chest tube on to waterseal drainage.  He was breathing spontaneously on 2 L/min oxygen via nasal cannula.  He denied shortness of breath at rest but reported dyspnea with exertion.  He denied pain or discomfort at the time of evaluation and stated he experience significant relief following chest tube placement yesterday.  Patient did report right-sided chest pain intermittently, sharp, pain scale of 5-6, worse with movement and deep inspiration, which has been managed with as needed medications.  Goals of care were revisited.  The patient wishes to continue full CODE STATUS and full scope of treatment, with hope for clinical improvement or medical optimization.  He remains open to cancer directed therapies as appropriate.  I discussed about adding scheduled Tylenol  to help optimize pain control.  Created space and opportunity for patient to explore thoughts feelings and fears regarding current medical situation.  Patient and her family face treatment option decisions, advanced directive decisions and anticipatory care needs.   Discussed the importance of continued conversation with family and their  medical providers regarding overall plan of care and treatment options, ensuring decisions are within the context of the patients values and GOCs. The patient, family, and medical team were encouraged to revisit these discussions regularly, as preferences and circumstances may change over time.  Questions and concerns addressed   Palliative Support Provided.   Discussed/coordinated plan of care with primary RN.  Advised to monitor patient for uncontrolled symptoms including pain and shortness of  breath.   Consider medication adjustments as needed.  No reports of significant events overnight.  Objective Assessment: Vital Signs Vitals:   09/06/24 0609 09/06/24 0833  BP: 112/79 115/67  Pulse: 82 83  Resp: (!) 24   Temp: 98.2 F (36.8 C)   SpO2: 100%     Intake/Output Summary (Last 24 hours) at 09/06/2024 0937 Last data filed at 09/06/2024 0854 Gross per 24 hour  Intake 250 ml  Output 2750 ml  Net -2500 ml   Last Weight  Most recent update: 09/04/2024  9:12 AM    Weight  92.5 kg (204 lb)             Physical Exam Constitutional:      Appearance: He is ill-appearing.  HENT:     Head: Normocephalic and atraumatic.     Mouth/Throat:     Mouth: Mucous membranes are moist.  Cardiovascular:     Rate and Rhythm: Normal rate.  Pulmonary:     Effort: Pulmonary effort is normal.     Comments: Chest tube present to right chest wall. Diminished bilaterally, more to right side. Abdominal:     General: Abdomen is flat. Bowel sounds are normal.     Palpations: Abdomen is soft.  Musculoskeletal:     Comments: Generalized weakness  Skin:    General: Skin is warm.  Neurological:     Mental Status: He is alert. Mental status is at baseline.     Comments: Drowsy  Psychiatric:        Mood and Affect: Mood normal.     SUMMARY OF RECOMMENDATIONS    # Complex medical decision-making/goals of care  Code Status: Maintain full CODE STATUS Patient is open to cancer directed therapies as appropriate Goal of care is medical stabilization and recovery to the extent this is possible Patient denies pain at the moment, however reports pain with exertion and deep inspiration.  Discussed adding scheduled Tylenol , to help with pain and decrease opiate requirement. Consider adjusting pain medication if pain is uncontrolled Palliative medicine team will continue to follow for support and symptom management  # Symptom management Continue with Dilaudid  0.5 to 1 mg Q2 as needed Change Tylenol  as  needed to scheduled 1,000 mg  TID for pain Continue with oxycodone  10 mg every 4 as needed for moderate pain Continue with trazodone  as needed for insomnia Palliative medicine is available to assist as needed  # Psychosocial support Continue to provide psycho-social and emotional support to patient and family.  # Discharge planning  To be determined.  # Prognosis  Prognosis is poor given patient's high disease burden and high risk for decompensation.    # Treatment plan discussed with: Patient, patient's brother Christopher, nursing, and medical team.   I personally spent a total of 35 minutes in the care of the patient today including preparing to see the patient, getting/reviewing separately obtained history, performing a medically appropriate exam/evaluation, counseling and educating, placing orders, documenting clinical information in the EHR, and coordinating care.   Thank you for allowing us  to participate in the care of Ellaree LELON Kirks   ______________________________________________________________________________________ Kathlyne Bolder NP-C [facility] Palliative Medicine Team Team Cell Phone: [phone] Please utilize secure chat with additional questions, if there is no response within 30 minutes please call the above phone number  Palliative Medicine Team providers are available by phone from 7am to 7pm daily and can be reached through the team cell phone.  Should this patient require assistance outside of  these hours, please call the patient's attending physician.     "

## 2024-09-07 ENCOUNTER — Inpatient Hospital Stay (HOSPITAL_COMMUNITY)

## 2024-09-07 DIAGNOSIS — Z515 Encounter for palliative care: Secondary | ICD-10-CM

## 2024-09-07 DIAGNOSIS — J90 Pleural effusion, not elsewhere classified: Secondary | ICD-10-CM | POA: Diagnosis not present

## 2024-09-07 LAB — CBC
HCT: 29.8 % — ABNORMAL LOW (ref 39.0–52.0)
Hemoglobin: 10.1 g/dL — ABNORMAL LOW (ref 13.0–17.0)
MCH: 31 pg (ref 26.0–34.0)
MCHC: 33.9 g/dL (ref 30.0–36.0)
MCV: 91.4 fL (ref 80.0–100.0)
Platelets: 454 10*3/uL — ABNORMAL HIGH (ref 150–400)
RBC: 3.26 MIL/uL — ABNORMAL LOW (ref 4.22–5.81)
RDW: 13.8 % (ref 11.5–15.5)
WBC: 11.5 10*3/uL — ABNORMAL HIGH (ref 4.0–10.5)
nRBC: 0.2 % (ref 0.0–0.2)

## 2024-09-07 NOTE — Progress Notes (Signed)
 " PROGRESS NOTE    Rick Edwards  FMW:[id number] DOB: Mar 23, 1956 DOA: 09/04/2024 PCP: Joshua Debby CROME, MD    Brief Narrative:   Rick Edwards is a 69 y.o. 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  Recently discharged following thoracentesis.  Since arriving back home his right-sided chest pain and shortness of breath has gradually worsened.  Worse with exertion.  Associated with mild nausea but no vomiting, no fevers.  Additionally with generalized weakness and difficulty performing his ADLs.  No further evidence of GI bleed, dark tarry stools.  Has been compliant with his home medication including Coumadin .  In the ED, temperature 97.3 F, HR 110, RR 25, BP 117/77, O2 to 100% on room air.  WBC 10.5, Huma 12.2, platelet count 513.  Sodium 138, potassium 4.5, chloride 100, CO2 26, glucose 146, BUN 18, creat 1.06.  AST 19, ALT 20, total bilirubin 0.8.  INR 5.7.  Urinalysis unrevealing.  Chest x-ray with large right pleural effusion with complete opacification of the right hemithorax, no acute radiographic abnormality in the left lung.  TRH consulted for admission for recurrent large right malignant pleural effusion.  Assessment & Plan:   Large right malignant pleural effusion, recurrent s/p chest tube Patient presents with right-sided chest pain, shortness of breath, generalized weakness.  Recently diagnosed with adenocarcinoma of unknown clear primary following most recent hospitalization with thoracentesis and bronchoscopy.  Imaging previously noted left upper lobe nodule, right renal lesion concerning for RCC versus GI source.  Patient is  afebrile.  Chest x-ray with large right pleural effusion with complete opacification of the right hemithorax.  PCCM was consulted and underwent thoracentesis on 09/04/2024 with removal of 900 mL of turbid fluid -- PCCM following, appreciate assistance -- Chest tube placement pulmonology 1/30 -- Chest tube output: 1.6L past 24h -- Continue supplemental oxygen, maintain SpO2 greater than 92% -- Chest x-ray in the a.m.  Adenocarcinoma, unclear primary Concern for lung versus GI versus renal etiology.  Follows with medical oncology, Dr. Federico. He had biopsy done via bronchoscopy with pulmonology at [facility] earlier this month. He was scheduled for outpatient PET scan 09/05/2024; but due to recurrent hospitalization will need to reschedule.  Antithrombin III  deficiency Supratherapeutic INR Chronically on Coumadin .  INR elevated 5.7 on admission.  Received 2.5 mg p.o. vitamin K  and 5mg  on 1/30 -- INR 5.7>4.2>2.0 -- Holding warfarin for now -- INR daily  Recent upper GI bleed 2/2 duodenal ulcer Recent EGD 08/11/2024 with findings of oozing cratered duodenal ulcer s/p hemostatic spray with resolution of bleeding. -- Hgb 12.2>11.5>11.710.6; stable -- CBC daily  HTN -- Amlodipine  10 mg p.o. daily -- Carvedilol  25 mg p.o. twice daily -- Spironolactone  25 mg p.o. daily  HLD Peripheral vascular disease -- Atorvastatin  40 mg p.o. daily  CKD stage IIIa -- Cr 1.06>0.92>0.99, stable  History of Crohn's disease Follows with [facility] gastroenterology outpatient; Dr. Elicia.  Not on immunosuppressants outpatient  DM2 Diet controlled at baseline.  Last hemoglobin A1c 6.2%.   DVT prophylaxis: SCDs Start: 09/04/24 1242    Code Status: Full Code Family Communication: Updated brother present at bedside this morning  Disposition Plan:  Level of care: Progressive Status  is: Inpatient Remains inpatient appropriate because: Chest tube remains in place    Consultants:  PCCM Palliative  Care  Procedures:  Right thoracentesis, PCCM 1/29 Chest tube, PCCM 1/30  Antimicrobials:  None   Subjective: Patient seen examined bedside, lying in bed.  No family present.  Dyspnea much improved with improved aeration of right upper lung fields on imaging.  Chest tube remains in place, 1.6 L out past 24 hours. No complaints or concerns at this time.  Denies headache, no fever/chills/night sweats, no nausea/vomiting/diarrhea, no focal weakness, no chest pain, no palpitations, no abdominal pain, no paresthesias.  No acute events overnight per nursing staff.  Objective: Vitals:   09/06/24 2040 09/07/24 0527 09/07/24 0821 09/07/24 1240  BP: 117/74 106/71 108/67 104/61  Pulse: 88 85 81 86  Resp: 12 20  (!) 22  Temp: 98.1 F (36.7 C) 98.7 F (37.1 C)  98.5 F (36.9 C)  TempSrc: Oral Oral  Oral  SpO2: 100% 99%  100%  Weight:      Height:        Intake/Output Summary (Last 24 hours) at 09/07/2024 1315 Last data filed at 09/07/2024 1145 Gross per 24 hour  Intake 490 ml  Output 500 ml  Net -10 ml   Filed Weights   09/04/24 0912  Weight: 92.5 kg    Examination:  Physical Exam: GEN: Mild respiratory distress, alert and oriented x 3, chronically ill appearance, appears older than stated age HEENT: NCAT, PERRL, EOMI, sclera clear, MMM PULM: Absent breath sounds right hemithorax, CTA left chest without wheezing/crackles, slightly increased respiratory effort without accessory muscle use, on 2 L nasal cannula with SpO2 100% at rest CV: RRR w/o M/G/R GI: abd soft, NTND, + BS MSK: no peripheral edema, moves all extremities independently NEURO: No focal neurological deficit PSYCH: normal mood/affect Integumentary: No concerning rashes/lesions/wounds noted on exposed skin surfaces    Data Reviewed: I have personally reviewed following labs and imaging studies  CBC: Recent Labs  Lab 09/04/24 1739 09/05/24 0649 09/05/24 1658 09/06/24 0538 09/07/24 0626  WBC 11.7* 11.6* 13.0*  12.0* 11.5*  HGB 11.5* 11.7* 11.6* 10.6* 10.1*  HCT 34.8* 37.1* 35.2* 33.8* 29.8*  MCV 91.8 93.9 92.1 94.7 91.4  PLT 498* 498* 470* 479* 454*   Basic Metabolic Panel: Recent Labs  Lab 09/04/24 1026 09/05/24 0649 09/06/24 0538  NA 138 137 134*  K 4.5 5.0 4.7  CL 100 101 98  CO2 26 28 30   GLUCOSE 146* 142* 139*  BUN 18 22 21   CREATININE 1.06 0.92 0.99  CALCIUM  10.1 9.8 8.9   GFR: Estimated Creatinine Clearance: 82.4 mL/min (by C-G formula based on SCr of 0.99 mg/dL). Liver Function Tests: Recent Labs  Lab 09/04/24 1026 09/04/24 1624  AST 19  --   ALT 20  --   ALKPHOS 108  --   BILITOT 0.8  --   PROT 7.4 6.9  ALBUMIN  3.9  --    Recent Labs  Lab 09/04/24 1026  LIPASE 27   No results for input(s): AMMONIA in the last 168 hours. Coagulation Profile: Recent Labs  Lab 09/04/24 1210 09/05/24 0649 09/06/24 0538  INR 5.7* 4.2* 2.0*   Cardiac Enzymes: No results for input(s): CKTOTAL, CKMB, CKMBINDEX, TROPONINI in the last 168 hours. BNP (last 3 results) Recent Labs    08/08/24 1227  PROBNP <50.0   HbA1C: No results for input(s): HGBA1C in the last 72 hours. CBG: No results for input(s): GLUCAP in the last 168 hours.  Lipid Profile: No results for input(s): CHOL, HDL, LDLCALC, TRIG, CHOLHDL, LDLDIRECT in the last 72 hours. Thyroid  Function Tests: No results for input(s): TSH, T4TOTAL, FREET4, T3FREE, THYROIDAB in the last 72 hours. Anemia Panel: No results for input(s): VITAMINB12, FOLATE, FERRITIN, TIBC, IRON, RETICCTPCT in the last 72 hours. Sepsis Labs: No results for input(s): PROCALCITON, LATICACIDVEN in the last 168 hours.  Recent Results (from the past 240 hours)  Body fluid culture w Gram Stain     Status: None (Preliminary result)   Collection Time: 09/04/24  3:49 PM   Specimen: Pleural Fluid  Result Value Ref Range Status   Specimen Description   Final    PLEURAL Performed at [facility], [address], [place], [place] [address]    Special Requests   Final    NONE Performed at [facility], [address], [place], [place] [address]    Gram Stain NO WBC SEEN NO ORGANISMS SEEN   Final   Culture   Final    NO GROWTH 3 DAYS Performed at [facility], [address] [place], [place] [address]    Report Status PENDING  Incomplete         Radiology Studies: DG CHEST PORT 1 VIEW Result Date: 09/07/2024 EXAM: 1 VIEW(S) XRAY OF THE CHEST 09/07/2024 05:07:00 AM COMPARISON: Portable chest 09/06/2024 05:29:00 AM. CLINICAL HISTORY: Pleural effusion. FINDINGS: LINES, TUBES AND DEVICES: Pigtail chest tube in the right lower thorax is unchanged. Overlying telemetry leads. LUNGS AND PLEURA: Moderate right pleural fluid continues to be seen but has improved, with atelectasis versus hazy infiltrate in the right mid lung and clear and better aerated right apex. There is minimal left pleural effusion with the left lung otherwise clear. No pneumothorax. HEART AND MEDIASTINUM: No acute abnormality of the cardiac and mediastinal silhouettes. BONES AND SOFT TISSUES: Slight thoracic levoscoliosis. IMPRESSION: 1. Moderate right pleural fluid, improved compared to prior study, with associated atelectasis versus hazy infiltrate in the reaerated right mid lung and clear and better aerated right apex. 2. Pigtail chest tube in the right lower thorax is unchanged. No measurable pneumothorax. 3. Minimal left pleural effusion with the left lung otherwise clear. Electronically signed by: Francis Quam MD 09/07/2024 05:57 AM EST RP Workstation: [id number]   DG CHEST PORT 1 VIEW Result Date: 09/06/2024 EXAM: 1 VIEW(S) XRAY OF THE CHEST 09/06/2024 05:36:00 AM COMPARISON: 09/05/2024 CLINICAL HISTORY: Pleural effusion. FINDINGS: LINES, TUBES AND DEVICES: Right basilar chest tube stable in place. LUNGS AND PLEURA: There has been mild decreased volume of the right  pleural effusion with improved aeration to the right upper lung zone. No pneumothorax identified. Minimal left basilar atelectasis. No focal pulmonary opacity. HEART AND MEDIASTINUM: No acute abnormality of the cardiac and mediastinal silhouettes. BONES AND SOFT TISSUES: No acute osseous abnormality. IMPRESSION: 1. Mild decreased volume of the right pleural effusion with improved aeration of the right upper lung zone, with right basilar chest tube in stable position. No pneumothorax. Electronically signed by: Waddell Calk MD 09/06/2024 01:16 PM EST RP Workstation: [id number]   DG CHEST PORT 1 VIEW Result Date: 09/05/2024 CLINICAL DATA:  Encounter for chest tube placement. EXAM: PORTABLE CHEST 1 VIEW COMPARISON:  09/05/2024 FINDINGS: Pigtail chest tube placed into the right lower chest region. There is persistent opacification throughout the right hemithorax. Trachea remains midline. Stable fullness in the left hilum that could represent vascular structures. No definite pneumothorax. Cardiac silhouette is stable. IMPRESSION: Placement of right chest tube. Persistent  opacification throughout the right hemithorax. Electronically Signed   By: Juliene Balder M.D.   On: 09/05/2024 16:39        Scheduled Meds:  acetaminophen   1,000 mg Oral TID   allopurinol   300 mg Oral Daily   amLODipine   10 mg Oral QHS   atorvastatin   40 mg Oral QHS   carvedilol   25 mg Oral BID WC   doxazosin   4 mg Oral QHS   enoxaparin  (LOVENOX ) injection  90 mg Subcutaneous Q12H   sodium chloride  flush  10 mL Intrapleural Q6H   spironolactone   25 mg Oral Daily   Continuous Infusions:   LOS: 3 days    Time spent: 53 minutes spent on 09/07/2024 caring for this patient face-to-face including chart review, ordering labs/tests, documenting, discussion with nursing staff, consultants, updating family and interview/physical exam    Camellia PARAS Erich Kochan, DO [facility] Available via Epic secure chat 7am-7pm After these hours,  please refer to coverage provider listed on [email or web address] 09/07/2024, 1:15 PM   "

## 2024-09-07 NOTE — Plan of Care (Signed)
" °  Medical records reviewed including progress notes, labs, imaging and MAR.  Reviewed nursing progress note from 09/06/2024 at 5:04 PM, noted for 1500 mL output from chest tube morning shift.  Chest tube to be remained unclamped.  Reviewed PT and OT progress notes from 09/06/2024, noting that patient is still very limited with mobility due to breathing/lung capacity.  Reviewed hospitalist Dr Austria progress note from 09/06/2024 detailing treatment plan mainly for large right malignant pleural effusion, noted for chest tube output of 2.5 L over the past 24 hours.  Noted that repeat chest x-ray showed improved aeration to the right upper lobe.  Reviewed MAR over the last 24 hours: Patient is taking all his scheduled medications.  He also received as needed hydromorphone  x 1 dose, as needed oxycodone  x 1 dose, and as needed trazodone  x 1 dose.  Goals of care are clear for full code and full scope of treatment.  Patient is open to cancer directed therapies as appropriate.  Plan is to continue current medical interventions, hoping to be medically optimized to an extent possible.  PMT will continue to follow for acute needs.  Thank you for referral and allowing PMT to assist in  Rick Edwards Rick Edwards's care.   Kathlyne FALCON. Klair Leising Jr., AGNP-C Palliative Medicine Team Phone: [phone]  NO CHARGE     "

## 2024-09-08 ENCOUNTER — Inpatient Hospital Stay (HOSPITAL_COMMUNITY): Admitting: Anesthesiology

## 2024-09-08 ENCOUNTER — Telehealth: Payer: Self-pay

## 2024-09-08 ENCOUNTER — Inpatient Hospital Stay (HOSPITAL_COMMUNITY)

## 2024-09-08 ENCOUNTER — Encounter (HOSPITAL_COMMUNITY): Payer: Self-pay | Admitting: Internal Medicine

## 2024-09-08 ENCOUNTER — Encounter (HOSPITAL_COMMUNITY): Admission: EM | Payer: Self-pay | Source: Home / Self Care | Attending: Internal Medicine

## 2024-09-08 DIAGNOSIS — E119 Type 2 diabetes mellitus without complications: Secondary | ICD-10-CM

## 2024-09-08 DIAGNOSIS — Z87891 Personal history of nicotine dependence: Secondary | ICD-10-CM

## 2024-09-08 DIAGNOSIS — D175 Benign lipomatous neoplasm of intra-abdominal organs: Secondary | ICD-10-CM

## 2024-09-08 DIAGNOSIS — J90 Pleural effusion, not elsewhere classified: Secondary | ICD-10-CM | POA: Diagnosis not present

## 2024-09-08 DIAGNOSIS — I10 Essential (primary) hypertension: Secondary | ICD-10-CM

## 2024-09-08 LAB — CBC
HCT: 23.2 % — ABNORMAL LOW (ref 39.0–52.0)
HCT: 28.9 % — ABNORMAL LOW (ref 39.0–52.0)
Hemoglobin: 7.7 g/dL — ABNORMAL LOW (ref 13.0–17.0)
Hemoglobin: 10 g/dL — ABNORMAL LOW (ref 13.0–17.0)
MCH: 30 pg (ref 26.0–34.0)
MCH: 30.6 pg (ref 26.0–34.0)
MCHC: 33.2 g/dL (ref 30.0–36.0)
MCHC: 34.6 g/dL (ref 30.0–36.0)
MCV: 90.3 fL (ref 80.0–100.0)
MCV: 88.4 fL (ref 80.0–100.0)
Platelets: 502 10*3/uL — ABNORMAL HIGH (ref 150–400)
Platelets: 429 10*3/uL — ABNORMAL HIGH (ref 150–400)
RBC: 2.57 MIL/uL — ABNORMAL LOW (ref 4.22–5.81)
RBC: 3.27 MIL/uL — ABNORMAL LOW (ref 4.22–5.81)
RDW: 13.8 % (ref 11.5–15.5)
RDW: 14.4 % (ref 11.5–15.5)
WBC: 16.9 10*3/uL — ABNORMAL HIGH (ref 4.0–10.5)
WBC: 18.5 10*3/uL — ABNORMAL HIGH (ref 4.0–10.5)
nRBC: 0.4 % — ABNORMAL HIGH (ref 0.0–0.2)
nRBC: 0.1 % (ref 0.0–0.2)

## 2024-09-08 LAB — BODY FLUID CULTURE W GRAM STAIN
Culture: NO GROWTH
Gram Stain: NONE SEEN

## 2024-09-08 LAB — POCT I-STAT, CHEM 8
BUN: 44 mg/dL — ABNORMAL HIGH (ref 8–23)
Calcium, Ion: 1.16 mmol/L (ref 1.15–1.40)
Chloride: 99 mmol/L (ref 98–111)
Creatinine, Ser: 1.2 mg/dL (ref 0.61–1.24)
Glucose, Bld: 102 mg/dL — ABNORMAL HIGH (ref 70–99)
HCT: 24 % — ABNORMAL LOW (ref 39.0–52.0)
Hemoglobin: 8.2 g/dL — ABNORMAL LOW (ref 13.0–17.0)
Potassium: 5.3 mmol/L — ABNORMAL HIGH (ref 3.5–5.1)
Sodium: 133 mmol/L — ABNORMAL LOW (ref 135–145)
TCO2: 24 mmol/L (ref 22–32)

## 2024-09-08 LAB — BASIC METABOLIC PANEL WITH GFR
Anion gap: 11 (ref 5–15)
BUN: 44 mg/dL — ABNORMAL HIGH (ref 8–23)
CO2: 21 mmol/L — ABNORMAL LOW (ref 22–32)
Calcium: 8.1 mg/dL — ABNORMAL LOW (ref 8.9–10.3)
Chloride: 101 mmol/L (ref 98–111)
Creatinine, Ser: 0.96 mg/dL (ref 0.61–1.24)
GFR, Estimated: >60 mL/min
Glucose, Bld: 102 mg/dL — ABNORMAL HIGH (ref 70–99)
Potassium: 5.4 mmol/L — ABNORMAL HIGH (ref 3.5–5.1)
Sodium: 132 mmol/L — ABNORMAL LOW (ref 135–145)

## 2024-09-08 LAB — HEMOGLOBIN AND HEMATOCRIT, BLOOD
HCT: 23.4 % — ABNORMAL LOW (ref 39.0–52.0)
HCT: 22.6 % — ABNORMAL LOW (ref 39.0–52.0)
Hemoglobin: 7.7 g/dL — ABNORMAL LOW (ref 13.0–17.0)
Hemoglobin: 7.4 g/dL — ABNORMAL LOW (ref 13.0–17.0)

## 2024-09-08 LAB — PROTIME-INR
INR: 1.1 (ref 0.8–1.2)
Prothrombin Time: 15.3 s — ABNORMAL HIGH (ref 11.4–15.2)

## 2024-09-08 LAB — PREPARE RBC (CROSSMATCH)

## 2024-09-08 LAB — OCCULT BLOOD X 1 CARD TO LAB, STOOL: Fecal Occult Bld: POSITIVE — AB

## 2024-09-08 MED ORDER — ONDANSETRON HCL 4 MG/2ML IJ SOLN
INTRAMUSCULAR | Status: DC | PRN
  Administered 2024-09-08: 4 mg via INTRAVENOUS

## 2024-09-08 MED ORDER — SODIUM CHLORIDE 0.9% IV SOLUTION: Freq: Once | INTRAVENOUS | Status: AC

## 2024-09-08 MED ORDER — SODIUM CHLORIDE 0.9 % IV SOLN
2.0000 g | INTRAVENOUS | Status: AC
  Administered 2024-09-08 – 2024-09-12 (×5): 2 g via INTRAVENOUS
  Filled 2024-09-08 (×6): qty 20

## 2024-09-08 MED ORDER — ALBUMIN HUMAN 5 % IV SOLN: INTRAVENOUS | Status: DC | PRN

## 2024-09-08 MED ORDER — PHENYLEPHRINE HCL-NACL 20-0.9 MG/250ML-% IV SOLN
INTRAVENOUS | Status: DC | PRN
  Administered 2024-09-08: 35 ug/min via INTRAVENOUS

## 2024-09-08 MED ORDER — OCTREOTIDE LOAD VIA INFUSION
50.0000 ug | Freq: Once | INTRAVENOUS | Status: AC
  Administered 2024-09-08: 50 ug via INTRAVENOUS
  Filled 2024-09-08: qty 25

## 2024-09-08 MED ORDER — SODIUM CHLORIDE 0.9 % IV SOLN
50.0000 ug/h | INTRAVENOUS | Status: DC
  Administered 2024-09-08: 50 ug/h via INTRAVENOUS
  Filled 2024-09-08: qty 1

## 2024-09-08 MED ORDER — LIDOCAINE 2% (20 MG/ML) 5 ML SYRINGE
INTRAMUSCULAR | Status: DC | PRN
  Administered 2024-09-08: 40 mg via INTRAVENOUS

## 2024-09-08 MED ORDER — PHENYLEPHRINE 80 MCG/ML (10ML) SYRINGE FOR IV PUSH (FOR BLOOD PRESSURE SUPPORT)
PREFILLED_SYRINGE | INTRAVENOUS | Status: DC | PRN
  Administered 2024-09-08: 80 ug via INTRAVENOUS
  Administered 2024-09-08 (×2): 240 ug via INTRAVENOUS
  Administered 2024-09-08: 160 ug via INTRAVENOUS
  Administered 2024-09-08: 80 ug via INTRAVENOUS

## 2024-09-08 MED ORDER — PROPOFOL 500 MG/50ML IV EMUL
INTRAVENOUS | Status: DC | PRN
  Administered 2024-09-08: 65 ug/kg/min via INTRAVENOUS

## 2024-09-08 MED ORDER — SODIUM CHLORIDE 0.9 % IV SOLN: INTRAVENOUS | Status: DC

## 2024-09-08 MED ORDER — AZITHROMYCIN 500 MG PO TABS
500.0000 mg | ORAL_TABLET | Freq: Every day | ORAL | Status: AC
  Administered 2024-09-08 – 2024-09-12 (×5): 500 mg via ORAL
  Filled 2024-09-08 (×5): qty 1

## 2024-09-08 MED ORDER — ALBUMIN HUMAN 5 % IV SOLN
INTRAVENOUS | Status: AC
  Filled 2024-09-08: qty 250

## 2024-09-08 MED ORDER — PANTOPRAZOLE SODIUM 40 MG IV SOLR
40.0000 mg | Freq: Two times a day (BID) | INTRAVENOUS | Status: AC
  Administered 2024-09-08 – 2024-09-12 (×10): 40 mg via INTRAVENOUS
  Filled 2024-09-08 (×10): qty 10

## 2024-09-08 MED ORDER — IOHEXOL 350 MG/ML SOLN
100.0000 mL | Freq: Once | INTRAVENOUS | Status: AC | PRN
  Administered 2024-09-08: 100 mL via INTRAVENOUS

## 2024-09-08 MED ORDER — PROPOFOL 1000 MG/100ML IV EMUL
INTRAVENOUS | Status: AC
  Filled 2024-09-08: qty 100

## 2024-09-08 MED ORDER — PROPOFOL 10 MG/ML IV BOLUS
INTRAVENOUS | Status: DC | PRN
  Administered 2024-09-08 (×2): 10 mg via INTRAVENOUS

## 2024-09-08 NOTE — Transfer of Care (Signed)
 Immediate Anesthesia Transfer of Care Note  Patient: Rick Edwards  Procedure(s) Performed: EGD (ESOPHAGOGASTRODUODENOSCOPY)  Patient Location: PACU and Endoscopy Unit  Anesthesia Type:MAC  Level of Consciousness: awake, alert , oriented, and patient cooperative  Airway & Oxygen Therapy: Patient Spontanous Breathing and Patient connected to face mask oxygen  Post-op Assessment: Report given to RN  Post vital signs: Reviewed and stable  Last Vitals:  Vitals Value Taken Time  BP 92/44 09/08/24 13:10  Temp    Pulse 92 09/08/24 13:12  Resp 28 09/08/24 13:12  SpO2 97 % 09/08/24 13:12  Vitals shown include unfiled device data.  Last Pain:  Vitals:   09/08/24 1305  TempSrc:   PainSc: (P) 0-No pain      Patients Stated Pain Goal: 3 (09/04/24 1607)  Complications: No notable events documented. BP remains low as noted in pre-op prior to procedure.

## 2024-09-08 NOTE — Anesthesia Postprocedure Evaluation (Signed)
"   Anesthesia Post Note  Patient: Rick Edwards  Procedure(s) Performed: EGD (ESOPHAGOGASTRODUODENOSCOPY)     Patient location during evaluation: PACU Anesthesia Type: MAC Level of consciousness: awake and alert Pain management: pain level controlled Vital Signs Assessment: post-procedure vital signs reviewed and stable Respiratory status: spontaneous breathing, nonlabored ventilation, respiratory function stable and patient connected to nasal cannula oxygen Cardiovascular status: stable and blood pressure returned to baseline Postop Assessment: no apparent nausea or vomiting Anesthetic complications: no   No notable events documented.  Last Vitals:  Vitals:   09/08/24 1320 09/08/24 1330  BP: (!) (P) 91/51 (!) (P) 84/44  Pulse: 90 92  Resp: (!) 28 (!) 23  Temp:    SpO2: 100% 91%    Last Pain:  Vitals:   09/08/24 1318  TempSrc:   PainSc: 0-No pain                 Elisabet Gutzmer L Katrell Milhorn      "

## 2024-09-08 NOTE — Progress Notes (Signed)
 SLP Cancellation Note  Patient Details Name: Rick Edwards MRN: [id number] DOB: 07-20-1956   Cancelled treatment:       Reason Eval/Treat Not Completed: Patient at procedure or test/unavailable  Norleen IVAR Blase, MA, CCC-SLP Speech Therapy  09/08/2024, 12:24 PM

## 2024-09-08 NOTE — Progress Notes (Signed)
 Crna to treat

## 2024-09-08 NOTE — TOC Progression Note (Addendum)
 Transition of Care [facility]) - Progression Note    Patient Details  Name: SELMER ADDUCI MRN: [id number] Date of Birth: 1956-01-08  [facility] CM/SW Contact  Araly Kaas, Nathanel, RN Phone Number: 09/08/2024, 4:15 PM  Clinical Narrative: Will discuss PT recc [facility]-LVM w/Conella await call back.  -6p-received call back from Conella(sister) she recc to talk to patients mother who will come into hospital tomorrow. ICM will f/u on [facility] SNF.     Expected Discharge Plan: Home/Self Care Barriers to Discharge: Continued Medical Work up               Expected Discharge Plan and Services                                               Social Drivers of Health (SDOH) Interventions SDOH Screenings   Food Insecurity: No Food Insecurity (09/04/2024)  Housing: Low Risk (09/04/2024)  Transportation Needs: No Transportation Needs (09/04/2024)  Utilities: Not At Risk (09/04/2024)  Alcohol Screen: Low Risk (06/11/2023)  Depression (PHQ2-9): Low Risk (08/25/2024)  Financial Resource Strain: Low Risk (06/11/2023)  Physical Activity: Sufficiently Active (06/12/2024)  Social Connections: Moderately Integrated (09/04/2024)  Stress: No Stress Concern Present (06/12/2024)  Tobacco Use: Medium Risk (09/08/2024)  Health Literacy: Adequate Health Literacy (06/12/2024)    Readmission Risk Interventions     No data to display

## 2024-09-08 NOTE — Anesthesia Preprocedure Evaluation (Addendum)
 "                                  Anesthesia Evaluation  Patient identified by MRN, date of birth, ID band Patient awake    Reviewed: Allergy & Precautions, NPO status , Patient's Chart, lab work & pertinent test results, reviewed documented beta blocker date and time   Airway Mallampati: II  TM Distance: >3 FB Neck ROM: Full    Dental  (+) Poor Dentition, Missing, Chipped, Dental Advisory Given   Pulmonary former smoker Large right pleural effusion, chest tube in place   Pulmonary exam normal breath sounds clear to auscultation       Cardiovascular hypertension, Pt. on medications and Pt. on home beta blockers + Peripheral Vascular Disease  Normal cardiovascular exam Rhythm:Regular Rate:Normal     Neuro/Psych negative neurological ROS  negative psych ROS   GI/Hepatic negative GI ROS, Neg liver ROS,,,Crohn's   Endo/Other  diabetes, Type 2    Renal/GU Renal InsufficiencyRenal disease  negative genitourinary   Musculoskeletal  (+) Arthritis ,    Abdominal   Peds  Hematology  (+) Blood dyscrasia (antithrombin III  def on coumadin ), anemia Lab Results      Component                Value               Date                      WBC                      16.9 (H)            09/08/2024                HGB                      7.7 (L)             09/08/2024                HCT                      23.2 (L)            09/08/2024                MCV                      90.3                09/08/2024                PLT                      502 (H)             09/08/2024              Anesthesia Other Findings 69 y.o. 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   Reproductive/Obstetrics                              Anesthesia Physical Anesthesia Plan  ASA: 4  Anesthesia Plan: MAC   Post-op Pain Management:    Induction: Intravenous  PONV Risk Score and Plan: Propofol  infusion and Treatment may vary due to age or medical condition  Airway Management Planned: Natural Airway  Additional Equipment:   Intra-op Plan:   Post-operative Plan:   Informed Consent: I have reviewed the patients History and Physical, chart, labs and discussed the procedure including the risks, benefits and alternatives for the proposed anesthesia with the patient or authorized representative who has indicated his/her understanding and acceptance.     Dental advisory given  Plan Discussed with: CRNA  Anesthesia Plan Comments:          Anesthesia Quick Evaluation  "

## 2024-09-08 NOTE — Progress Notes (Signed)
 Dr niels notified of pts bp and post procedure status.

## 2024-09-08 NOTE — Anesthesia Procedure Notes (Signed)
 Procedure Name: MAC Date/Time: 09/08/2024 12:26 PM  Performed by: Zulema Leita PARAS, CRNAPre-anesthesia Checklist: Patient identified, Emergency Drugs available, Suction available and Patient being monitored Oxygen Delivery Method: Simple face mask

## 2024-09-09 ENCOUNTER — Ambulatory Visit

## 2024-09-09 ENCOUNTER — Inpatient Hospital Stay (HOSPITAL_COMMUNITY)

## 2024-09-09 DIAGNOSIS — J90 Pleural effusion, not elsewhere classified: Secondary | ICD-10-CM | POA: Diagnosis not present

## 2024-09-09 LAB — BASIC METABOLIC PANEL WITH GFR
Anion gap: 7 (ref 5–15)
BUN: 29 mg/dL — ABNORMAL HIGH (ref 8–23)
CO2: 25 mmol/L (ref 22–32)
Calcium: 8.1 mg/dL — ABNORMAL LOW (ref 8.9–10.3)
Chloride: 103 mmol/L (ref 98–111)
Creatinine, Ser: 0.78 mg/dL (ref 0.61–1.24)
GFR, Estimated: >60 mL/min
Glucose, Bld: 102 mg/dL — ABNORMAL HIGH (ref 70–99)
Potassium: 4.4 mmol/L (ref 3.5–5.1)
Sodium: 135 mmol/L (ref 135–145)

## 2024-09-09 LAB — TYPE AND SCREEN
ABO/RH(D): O POS
Antibody Screen: NEGATIVE
Unit division: 0
Unit division: 0

## 2024-09-09 LAB — BODY FLUID CELL COUNT WITH DIFFERENTIAL
Eos, Fluid: 0 %
Lymphs, Fluid: 11 %
Monocyte-Macrophage-Serous Fluid: 78 % (ref 50–90)
Neutrophil Count, Fluid: 11 % (ref 0–25)
Total Nucleated Cell Count, Fluid: 2940 uL — ABNORMAL HIGH (ref 0–1000)

## 2024-09-09 LAB — BPAM RBC
Blood Product Expiration Date: 202603012359
ISSUE DATE / TIME: 202602020238
ISSUE DATE / TIME: 202602021036
ISSUE DATE / TIME: 202603012359
Unit Type and Rh: 202603012359
Unit Type and Rh: 202603012359
Unit Type and Rh: 5100
Unit Type and Rh: 5100

## 2024-09-09 LAB — STREP PNEUMONIAE URINARY ANTIGEN: Strep Pneumo Urinary Antigen: NEGATIVE

## 2024-09-09 LAB — CBC
HCT: 22.2 % — ABNORMAL LOW (ref 39.0–52.0)
Hemoglobin: 7.3 g/dL — ABNORMAL LOW (ref 13.0–17.0)
MCH: 29.9 pg (ref 26.0–34.0)
MCHC: 32.9 g/dL (ref 30.0–36.0)
MCV: 91 fL (ref 80.0–100.0)
Platelets: 372 10*3/uL (ref 150–400)
RBC: 2.44 MIL/uL — ABNORMAL LOW (ref 4.22–5.81)
RDW: 14.6 % (ref 11.5–15.5)
WBC: 15.5 10*3/uL — ABNORMAL HIGH (ref 4.0–10.5)
nRBC: 0.3 % — ABNORMAL HIGH (ref 0.0–0.2)

## 2024-09-09 LAB — HEMOGLOBIN AND HEMATOCRIT, BLOOD
HCT: 23.7 % — ABNORMAL LOW (ref 39.0–52.0)
Hemoglobin: 8 g/dL — ABNORMAL LOW (ref 13.0–17.0)

## 2024-09-09 LAB — PROTIME-INR
INR: 1.2 (ref 0.8–1.2)
Prothrombin Time: 16.1 s — ABNORMAL HIGH (ref 11.4–15.2)

## 2024-09-09 LAB — PREPARE RBC (CROSSMATCH)

## 2024-09-09 MED ORDER — OXYCODONE HCL 5 MG PO TABS
10.0000 mg | ORAL_TABLET | ORAL | Status: AC | PRN
  Administered 2024-09-10 – 2024-09-12 (×3): 10 mg via ORAL
  Filled 2024-09-09 (×4): qty 2

## 2024-09-09 MED ORDER — BOOST / RESOURCE BREEZE PO LIQD CUSTOM
1.0000 | Freq: Three times a day (TID) | ORAL | Status: DC
  Administered 2024-09-10 – 2024-09-11 (×2): 1 via ORAL

## 2024-09-09 MED ORDER — SODIUM CHLORIDE 0.9% IV SOLUTION: Freq: Once | INTRAVENOUS | Status: AC

## 2024-09-09 NOTE — Progress Notes (Signed)
 Rick Edwards 1:37 PM  Subjective: Patient doing fine no abdominal pain has not had a bowel movement but just gas no obvious signs of bleeding  Objective: Vital signs stable afebrile no acute distress abdomen is soft nontender BUN and creatinine decreased hemoglobin decreased some as well with hydration  Assessment: Duodenal ulcer with bleed in patient with multiple medical problems  Plan: Expect hemoglobin to drop a little more when BUN drops continue clear liquids for 1 more day but if no signs of bleeding may try to advance diet tomorrow and I will check on tomorrow  [facility]  office [phone] After 5PM or if no answer call [phone]

## 2024-09-09 NOTE — Progress Notes (Signed)
 Physical Therapy Treatment Patient Details Name: Rick Edwards MRN: [id number] DOB: 01-25-1956 Today's Date: 09/09/2024   History of Present Illness 69 yo admit with right sided chest pain and dyspnea , was scheduled for PET scan on 1/30 but came to ED on 1/29 instead. Finding during this admission with recurrent Right sided pleural effusion with complete opacification. 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 Brother is very supportive and present. Prior to previous admission pt was independent    PT Comments  Pt agreeable to working with therapy. He required Min A +2 for safe mobility. He reported significant back pain during session-RN notified thru secure chat. He was able to tolerate pivoting over to recliner using a RW. Encouraged pt to sit up as tolerated. Will continue to progress activity. Patient will benefit from continued inpatient follow up therapy, <3 hours/day     If plan is discharge home, recommend the following: Assistance with cooking/housework;Assist for transportation;Help with stairs or ramp for entrance;A lot of help with bathing/dressing/bathroom;A lot of help with walking and/or transfers   Can travel by private vehicle     No  Equipment Recommendations   (TBD at next venue)    Recommendations for Other Services       Precautions / Restrictions Precautions Precautions: Fall Precaution/Restrictions Comments: chest tube Restrictions Weight Bearing Restrictions Per Provider Order: No     Mobility  Bed Mobility Overal bed mobility: Needs Assistance Bed Mobility: Supine to Sit     Supine to sit: +2 for safety/equipment, Contact guard, HOB elevated, Used rails     General bed mobility comments: Increased time. Bed elevated.  Monitor lines.    Transfers Overall transfer level: Needs assistance Equipment used: Rolling walker (2 wheels) Transfers: Sit to/from Stand Sit to Stand: Min assist, +2 physical assistance, From elevated surface, +2 safety/equipment   Step pivot transfers: Min assist, +2 physical assistance, +2 safety/equipment       General transfer comment: Assist to rise, steady, control descent. Cues for safety, technqiue, hand placement.    Ambulation/Gait                   Stairs             Wheelchair Mobility     Tilt Bed    Modified Rankin (Stroke Patients Only)       Balance Overall balance assessment: Needs assistance         Standing balance support: Bilateral upper extremity supported, During functional activity, Reliant on assistive device for balance Standing balance-Leahy Scale: Fair                              Hotel Manager: Impaired Factors Affecting Communication: Hearing impaired  Cognition Arousal: Alert Behavior During Therapy: WFL for tasks assessed/performed   PT - Cognitive impairments: No apparent impairments Difficult to assess due to: Hard of hearing/deaf                     PT - Cognition Comments: Pt very HOH wears hearing aids Following commands: Intact      Cueing Cueing Techniques: Verbal cues  Exercises      General Comments        Pertinent Vitals/Pain Pain Assessment  Pain Assessment: Faces Faces Pain Scale: Hurts whole lot Pain Location: back Pain Descriptors / Indicators: Aching, Grimacing Pain Intervention(s): Limited activity within patient's tolerance, Monitored during session, Repositioned    Home Living                          Prior Function            PT Goals (current goals can now be found in the care plan section) Progress towards PT goals: Progressing toward goals    Frequency    Min 2X/week      PT Plan       Co-evaluation              AM-PAC PT 6 Clicks Mobility   Outcome Measure  Help needed turning from your back to your side while in a flat bed without using bedrails?: A Little Help needed moving from lying on your back to sitting on the side of a flat bed without using bedrails?: A Lot Help needed moving to and from a bed to a chair (including a wheelchair)?: A Lot Help needed standing up from a chair using your arms (e.g., wheelchair or bedside chair)?: A Lot Help needed to walk in hospital room?: A Lot Help needed climbing 3-5 steps with a railing? : Total 6 Click Score: 12    End of Session Equipment Utilized During Treatment: Gait belt Activity Tolerance: Patient tolerated treatment well;Patient limited by pain Patient left: in chair;with call bell/phone within reach;with chair alarm set   PT Visit Diagnosis: Difficulty in walking, not elsewhere classified (R26.2);Muscle weakness (generalized) (M62.81)     Time: 8363-8348 PT Time Calculation (min) (ACUTE ONLY): 15 min  Charges:    $Therapeutic Activity: 8-22 mins PT General Charges $$ ACUTE PT VISIT: 1 Visit                         Dannial SQUIBB, PT Acute Rehabilitation  Office: [phone]

## 2024-09-09 NOTE — TOC Progression Note (Signed)
 Transition of Care [facility]) - Progression Note    Patient Details  Name: Rick Edwards MRN: [id number] Date of Birth: [birth year]  [facility] CM/SW Contact  Shadara Lopez, Nathanel, RN Phone Number: 09/09/2024, 4:00 PM  Clinical Narrative: Noted chest tube,clears-patient in agreement to ST SNF-will await d/c chest tube, & advanced diet prior faxing out for ST SNF.      Expected Discharge Plan: Skilled Nursing Facility Barriers to Discharge: Continued Medical Work up               Expected Discharge Plan and Services                                               Social Drivers of Health (SDOH) Interventions SDOH Screenings   Food Insecurity: No Food Insecurity (09/04/2024)  Housing: Low Risk (09/04/2024)  Transportation Needs: No Transportation Needs (09/04/2024)  Utilities: Not At Risk (09/04/2024)  Alcohol Screen: Low Risk (06/11/2023)  Depression (PHQ2-9): Low Risk (08/25/2024)  Financial Resource Strain: Low Risk (06/11/2023)  Physical Activity: Sufficiently Active (06/12/2024)  Social Connections: Moderately Integrated (09/04/2024)  Stress: No Stress Concern Present (06/12/2024)  Tobacco Use: Medium Risk (09/08/2024)  Health Literacy: Adequate Health Literacy (06/12/2024)    Readmission Risk Interventions     No data to display

## 2024-09-09 NOTE — Progress Notes (Signed)
 " PROGRESS NOTE    Rick Edwards  FMW:[id number] DOB: [birth year] DOA: 09/04/2024 PCP: Joshua Debby CROME, MD    Brief Narrative:   Rick Edwards is a 69 y.o. male with past medical history significant for Antithrombin III  deficiency on warfarin, Crohn's disease s/p ileostomy, HTN, recent upper GI bleed secondary to duodenal ulcer, right malignant pleural effusion recent diagnosis of adenocarcinoma of unknown primary (concern for GI vs Lung vs RCC) who presented to [facility] ED via EMS from home with generalized abdominal pain, shortness of breath.  Recently discharged following thoracentesis.  Since arriving back home his right-sided chest pain and shortness of breath has gradually worsened.  Worse with exertion.  Associated with mild nausea but no vomiting, no fevers.  Additionally with generalized weakness and difficulty performing his ADLs.  No further evidence of GI bleed, dark tarry stools.  Has been compliant with his home medication including Coumadin .  In the ED, temperature 97.3 F, HR 110, RR 25, BP 117/77, O2 to 100% on room air.  WBC 10.5, Huma 12.2, platelet count 513.  Sodium 138, potassium 4.5, chloride 100, CO2 26, glucose 146, BUN 18, creat 1.06.  AST 19, ALT 20, total bilirubin 0.8.  INR 5.7.  Urinalysis unrevealing.  Chest x-ray with large right pleural effusion with complete opacification of the right hemithorax, no acute radiographic abnormality in the left lung.  TRH consulted for admission for recurrent large right malignant pleural effusion.  Assessment & Plan:   Large right malignant pleural effusion, recurrent s/p chest tube Right lower lobe pneumonia Patient presents with right-sided chest pain, shortness of breath, generalized weakness.  Recently diagnosed with adenocarcinoma of unknown clear primary following most recent hospitalization with thoracentesis and bronchoscopy.  Imaging previously noted left upper lobe nodule, right renal lesion concerning for RCC  versus GI source.  Patient is afebrile.  Chest x-ray with large right pleural effusion with complete opacification of the right hemithorax.  PCCM was consulted and underwent thoracentesis on 09/04/2024 with removal of 900 mL of turbid fluid.  CT angiogram GI bleed study 2/2 with findings of dense consolidation right lower lobe with complex fluid in the right pleural space. -- PCCM following, appreciate assistance -- Chest tube placement pulmonology 1/30 -- Chest tube output: 410 mL past 24h -- Azithromycin  500 mg p.o. daily x 5 days -- Ceftriaxone  2 g IV every 24 hours x 5 days -- Continue supplemental oxygen, maintain SpO2 greater than 92% -- Chest x-ray in the a.m.  Upper GI bleed 2/2 duodenal ulcer, recurrent Recent EGD 08/11/2024 with findings of oozing cratered duodenal ulcer s/p hemostatic spray with resolution of bleeding.  Patient with recurrence of bloody bowel movements 2/2, CT angiogram GI bleed study with findings of extravasation duodenal bulb on arterial phase imaging, prominent mural vascularity low rectum on arterial phase concerning for angiodysplasia.  Patient underwent EGD on 09/08/2024 with findings of LA grade a esophagitis, 1 nonbleeding cratered duodenal ulcer with adherent clot duodenal bulb, gastric lipoma.  -- [facility] following, appreciate assistance -- Hgb 12.2>11.5>11.7>10.6>7.7>7.4>7.3 -- s/p 2u pRBC 2/2 -- repeat tranfusion 1u pRBC today; repeat H&H 2 hours following transfusion -- Clear liquid diet x 2 days -- CBC daily -- If further significant bleeding, GI recommends consideration of IR consult for embolization duodenal bulb  Adenocarcinoma, unclear primary Concern for lung versus GI versus renal etiology.  Follows with medical oncology, Dr. Federico. He had biopsy done via bronchoscopy with pulmonology at [facility] earlier this month. He was  scheduled for outpatient PET scan 09/05/2024; but due to recurrent hospitalization will need to reschedule.  Antithrombin III   deficiency Supratherapeutic INR Chronically on Coumadin .  INR elevated 5.7 on admission.  Received 2.5 mg p.o. vitamin K  and 5mg  on 1/30 -- INR 5.7>4.2>2.0>1.2 -- Holding warfarin for now -- INR daily  HTN At baseline on amlodipine  10 mg p.o. daily, Carvedilol  25 mg p.o. twice daily, Spironolactone  25 mg p.o. daily -- Hold antihypertensives given acute blood loss anemia secondary to recurrent GI bleed, hypotension  HLD Peripheral vascular disease -- Atorvastatin  40 mg p.o. daily  CKD stage IIIa -- Cr 1.06>0.92>0.99>0.78, stable  History of Crohn's disease Follows with [facility] gastroenterology outpatient; Dr. Elicia.  Not on immunosuppressants outpatient  DM2 Diet controlled at baseline.  Last hemoglobin A1c 6.2%.   DVT prophylaxis: Place and maintain sequential compression device Start: 09/08/24 9361 SCDs Start: 09/04/24 1242    Code Status: Full Code Family Communication: No family present at bedside this morning  Disposition Plan:  Level of care: Progressive Status is: Inpatient Remains inpatient appropriate because: Chest tube remains in place; needs further advancement of diet, SNF placement    Consultants:  PCCM Palliative Care [facility] gastroenterology  Procedures:  Right thoracentesis, PCCM 1/29 Chest tube, PCCM 1/30  Antimicrobials:  Azithromycin  2/2>> Ceftriaxone  2/2>>   Subjective: Patient seen examined bedside, lying in bed.  No family present.  Denies any shortness of breath this morning.  Chest tube remains in place, 4 and 10 mL out past 24 hours.  Underwent EGD yesterday with adherent clot noted to nonbleeding cratered duodenal ulcer.  Remains on clear liquid diet per GI.  Hemoglobin slightly trended down to 7.3 this morning, will repeat transfusion with 1 unit PRBC today.  No complaints or concerns at this time.  Denies headache, no fever/chills, no nausea/vomiting, no focal weakness, no chest pain, no palpitations, no abdominal  pain.  Objective: Vitals:   09/08/24 2025 09/09/24 0456 09/09/24 0921 09/09/24 0952  BP: 106/65 121/73 126/68 114/68  Pulse: 89 91 95 88  Resp: 16 16 20 20   Temp: 98.3 F (36.8 C) 98.2 F (36.8 C) 98.4 F (36.9 C) 98.5 F (36.9 C)  TempSrc: Oral Oral Oral Oral  SpO2: 100% 100% 100% 100%  Weight:      Height:        Intake/Output Summary (Last 24 hours) at 09/09/2024 1111 Last data filed at 09/09/2024 9145 Gross per 24 hour  Intake 3066.47 ml  Output 2910 ml  Net 156.47 ml   Filed Weights   09/04/24 0912  Weight: 92.5 kg    Examination:  Physical Exam: GEN: Mild respiratory distress, alert and oriented x 3, chronically ill appearance, appears older than stated age HEENT: NCAT, PERRL, EOMI, sclera clear, MMM PULM: Diminished breath sounds right lung base, CTA left chest without wheezing/crackles, normal respiratory effort without accessory muscle use, on room air with SpO2 100% at rest.   CV: RRR w/o M/G/R GI: abd soft, NTND, + BS MSK: no peripheral edema, moves all extremities independently NEURO: No focal neurological deficit PSYCH: normal mood/affect Integumentary: No concerning rashes/lesions/wounds noted on exposed skin surfaces    Data Reviewed: I have personally reviewed following labs and imaging studies  CBC: Recent Labs  Lab 09/06/24 0538 09/07/24 0626 09/08/24 0144 09/08/24 1102 09/08/24 1206 09/08/24 1540 09/08/24 2131 09/09/24 0536  WBC 12.0* 11.5* 16.9* 18.5*  --   --   --  15.5*  HGB 10.6* 10.1* 7.7* 10.0* 8.2* 7.7* 7.4* 7.3*  HCT 33.8* 29.8* 23.2* 28.9* 24.0* 23.4* 22.6* 22.2*  MCV 94.7 91.4 90.3 88.4  --   --   --  91.0  PLT 479* 454* 502* 429*  --   --   --  372   Basic Metabolic Panel: Recent Labs  Lab 09/04/24 1026 09/05/24 0649 09/06/24 0538 09/08/24 1102 09/08/24 1206 09/09/24 0536  NA 138 137 134* 132* 133* 135  K 4.5 5.0 4.7 5.4* 5.3* 4.4  CL 100 101 98 101 99 103  CO2 26 28 30  21*  --  25  GLUCOSE 146* 142* 139* 102*  102* 102*  BUN 18 22 21  44* 44* 29*  CREATININE 1.06 0.92 0.99 0.96 1.20 0.78  CALCIUM  10.1 9.8 8.9 8.1*  --  8.1*   GFR: Estimated Creatinine Clearance: 102 mL/min (by C-G formula based on SCr of 0.78 mg/dL). Liver Function Tests: Recent Labs  Lab 09/04/24 1026 09/04/24 1624  AST 19  --   ALT 20  --   ALKPHOS 108  --   BILITOT 0.8  --   PROT 7.4 6.9  ALBUMIN  3.9  --    Recent Labs  Lab 09/04/24 1026  LIPASE 27   No results for input(s): AMMONIA in the last 168 hours. Coagulation Profile: Recent Labs  Lab 09/04/24 1210 09/05/24 0649 09/06/24 0538 09/08/24 1102 09/09/24 0536  INR 5.7* 4.2* 2.0* 1.1 1.2   Cardiac Enzymes: No results for input(s): CKTOTAL, CKMB, CKMBINDEX, TROPONINI in the last 168 hours. BNP (last 3 results) Recent Labs    08/08/24 1227  PROBNP <50.0   HbA1C: No results for input(s): HGBA1C in the last 72 hours. CBG: No results for input(s): GLUCAP in the last 168 hours. Lipid Profile: No results for input(s): CHOL, HDL, LDLCALC, TRIG, CHOLHDL, LDLDIRECT in the last 72 hours. Thyroid  Function Tests: No results for input(s): TSH, T4TOTAL, FREET4, T3FREE, THYROIDAB in the last 72 hours. Anemia Panel: No results for input(s): VITAMINB12, FOLATE, FERRITIN, TIBC, IRON, RETICCTPCT in the last 72 hours. Sepsis Labs: No results for input(s): PROCALCITON, LATICACIDVEN in the last 168 hours.  Recent Results (from the past 240 hours)  Body fluid culture w Gram Stain     Status: None   Collection Time: 09/04/24  3:49 PM   Specimen: Pleural Fluid  Result Value Ref Range Status   Specimen Description   Final    PLEURAL Performed at [facility], [address] [place], [place] [address]    Special Requests   Final    NONE Performed at [facility], [address] [place], [place] [address]    Gram Stain NO WBC SEEN NO ORGANISMS SEEN   Final   Culture    Final    NO GROWTH 3 DAYS Performed at [facility], [address], [place], [place] [address]    Report Status 09/08/2024 FINAL  Final         Radiology Studies: DG CHEST PORT 1 VIEW Result Date: 09/09/2024 CLINICAL DATA:  Follow up pleural effusion. EXAM: PORTABLE CHEST 1 VIEW COMPARISON:  Abdominopelvic CT 09/08/2024. Chest radiographs 09/08/2024 and 09/07/2024. FINDINGS: 0707 hours. Small caliber pigtail right chest tube is unchanged in position. Unchanged right pleural effusion and right basilar airspace disease. No evidence of pneumothorax. The left lung is clear. The visualized heart size and mediastinal contours are stable. No acute osseous findings are seen. IMPRESSION: Unchanged right pleural effusion and right basilar airspace disease. No evidence of pneumothorax. Electronically Signed  By: Rick Edwards M.D.   On: 09/09/2024 11:05   CT ANGIO GI BLEED Addendum Date: 09/08/2024 ADDENDUM REPORT: 09/08/2024 06:41 ADDENDUM: Critical Value/emergent results were called by telephone at the time of interpretation on 09/08/2024 at 6:30 am to provider Rick Edwards , who verbally acknowledged these results. Electronically Signed   By: Rick Edwards M.D.   On: 09/08/2024 06:41   Result Date: 09/08/2024 CLINICAL DATA:  GI bleeding. EXAM: CTA ABDOMEN AND PELVIS WITHOUT AND WITH CONTRAST TECHNIQUE: Multidetector CT imaging of the abdomen and pelvis was performed using the standard protocol during bolus administration of intravenous contrast. Multiplanar reconstructed images and MIPs were obtained and reviewed to evaluate the vascular anatomy. RADIATION DOSE REDUCTION: This exam was performed according to the departmental dose-optimization program which includes automated exposure control, adjustment of the mA and/or kV according to patient size and/or use of iterative reconstruction technique. CONTRAST:  OMNIPAQUE  IOHEXOL  350 MG/ML SOLN COMPARISON:  CT from 08/08/2024 FINDINGS: VASCULAR  Aorta: Pre contrast imaging shows no hyperdense crescent in the wall of the thoracic aorta to suggest the presence of an acute intramural hematoma.Normal caliber aorta without aneurysm, dissection, vasculitis or significant stenosis. Celiac: Luminal narrowing and irregularity noted at the origin of the celiac axis. There is a linear filling defect in this region raising concern for focal dissection (sagittal image 103/12 and coronal image 58/11). This finding is stable since prior study 08/08/2024 and is best appreciated when reviewing the study on bone windows. Just distal to the origin, celiac axis dilates up to 13 x 13 mm diameter. Atherosclerotic disease evident. SMA: Patent without evidence of aneurysm, dissection, vasculitis or significant stenosis. Renals: Both renal arteries are patent without evidence of aneurysm, dissection, vasculitis, fibromuscular dysplasia or significant stenosis. IMA: Patent without evidence of aneurysm, dissection, vasculitis or significant stenosis. Inflow: Patent without evidence of aneurysm, dissection, vasculitis or significant stenosis. Proximal Outflow: Bilateral common femoral and visualized portions of the superficial and profunda femoral arteries are patent without evidence of aneurysm, dissection, vasculitis or significant stenosis. Veins: No obvious venous abnormality within the limitations of this arterial phase study. Review of the MIP images confirms the above findings. NON-VASCULAR Lower chest: Dense consolidation noted right lower lobe with complex fluid in the right pleural space. Pigtail pleural drain evident on the right and gas in the pleural space may be related to this instrumentation. There is right-sided pleural irregular thickening with debris in the right pleural fluid. Hepatobiliary: Multiple hepatic lesions are again noted, similar to prior and likely cyst. Scattered tiny hypodensities in the liver parenchyma are too small to characterize but are  statistically most likely benign. No followup imaging is recommended. Gallbladder is nondistended. No intrahepatic or extrahepatic biliary dilation. Pancreas: No focal mass lesion. No dilatation of the main duct. No intraparenchymal cyst. No peripancreatic edema. Spleen: No splenomegaly. No suspicious focal mass lesion. Adrenals/Urinary Tract: Stable thickening of both adrenal glands. Innumerable low-density lesions are seen in both kidneys, most of which are too small to characterize, but statistically most likely benign. No evidence for hydroureter. The urinary bladder appears normal for the degree of distention. Stomach/Bowel: Prominent mural lipoma noted distal antrum, stable. No gastric wall thickening. No evidence of outlet obstruction. There is a tiny focus of contrast extravasation in the duodenal bulb on arterial phase imaging (images 88 and 89 of series 5 and coronal images 57-59 of series 11). No high density material dislocation on precontrast imaging and this finding becomes markedly less apparent on portal venous phase  imaging. No evidence for contrast extravasation into nondilated small bowel loops. Patient is status post subtotal colectomy. There is some prominent mural vascularity in the low rectum on arterial phase imaging raising concern for angio dysplasia although no definite contrast extravasation is seen in the rectum. Lymphatic: There is no gastrohepatic or hepatoduodenal ligament lymphadenopathy. No retroperitoneal or mesenteric lymphadenopathy. No pelvic sidewall lymphadenopathy. Reproductive: Prostate gland is enlarged. Other: No substantial intraperitoneal free fluid. Musculoskeletal: Midline ventral hernia contains small bowel loops without complicating features, similar to prior. IMPRESSION: 1. Tiny focus of contrast extravasation in the duodenal bulb on arterial phase imaging. This finding becomes markedly less apparent on portal venous phase imaging. Imaging features are compatible  with active arterial phase GI bleeding in the duodenal bulb. Patient was noted to have a bleeding ulcer at this location on upper endoscopy of 08/11/2024. 2. Prominent mural vascularity in the low rectum on arterial phase imaging raising concern for angiodysplasia although no definite contrast extravasation is seen in the rectum. 3. Luminal narrowing and irregularity at the origin of the celiac axis with a linear filling defect in this region raising concern for focal dissection. This finding is stable since prior study 08/08/2024 and is best appreciated when reviewing the study on bone windows. Just distal to the origin, celiac axis dilates up to 13 x 13 mm diameter. 4. Dense consolidation right lower lobe with complex fluid in the right pleural space. Pigtail pleural drain evident on the right and gas in the pleural space may be related to this instrumentation. There is right-sided pleural irregular thickening with debris in the right pleural fluid. 5. Midline ventral hernia contains small bowel loops without complicating features, similar to prior. Electronically Signed: By: Rick Edwards M.D. On: 09/08/2024 06:27   DG CHEST PORT 1 VIEW Result Date: 09/08/2024 CLINICAL DATA:  Pleural effusion. EXAM: PORTABLE CHEST 1 VIEW COMPARISON:  09/07/2024 FINDINGS: Right chest tube remains in place with similar right pleural effusion and right base collapse/consolidation. No discernible pneumothorax. Left lung clear. The cardio pericardial silhouette is enlarged. Telemetry leads overlie the chest. IMPRESSION: Right chest tube remains in place with similar right pleural effusion and right base collapse/consolidation. Electronically Signed   By: Rick Edwards M.D.   On: 09/08/2024 06:01        Scheduled Meds:  acetaminophen   1,000 mg Oral TID   azithromycin   500 mg Oral Daily   pantoprazole  (PROTONIX ) IV  40 mg Intravenous Q12H   sodium chloride  flush  10 mL Intrapleural Q6H   Continuous Infusions:  sodium  chloride 75 mL/hr at 09/08/24 2319   cefTRIAXone  (ROCEPHIN )  IV 2 g (09/09/24 0905)     LOS: 5 days    Time spent: 51 minutes spent on 09/09/2024 caring for this patient face-to-face including chart review, ordering labs/tests, documenting, discussion with nursing staff, consultants, updating family and interview/physical exam    Rick PARAS Rick Ebner, DO [facility] Available via Epic secure chat 7am-7pm After these hours, please refer to coverage provider listed on [email or web address] 09/09/2024, 11:11 AM   "

## 2024-09-10 ENCOUNTER — Inpatient Hospital Stay: Admitting: Hematology and Oncology

## 2024-09-10 ENCOUNTER — Inpatient Hospital Stay

## 2024-09-10 ENCOUNTER — Inpatient Hospital Stay (HOSPITAL_COMMUNITY)

## 2024-09-10 ENCOUNTER — Inpatient Hospital Stay: Admission: RE | Admit: 2024-09-10 | Source: Ambulatory Visit

## 2024-09-10 DIAGNOSIS — J910 Malignant pleural effusion: Secondary | ICD-10-CM | POA: Diagnosis not present

## 2024-09-10 DIAGNOSIS — C3490 Malignant neoplasm of unspecified part of unspecified bronchus or lung: Secondary | ICD-10-CM | POA: Diagnosis not present

## 2024-09-10 LAB — BASIC METABOLIC PANEL WITH GFR
Anion gap: 7 (ref 5–15)
BUN: 11 mg/dL (ref 8–23)
CO2: 26 mmol/L (ref 22–32)
Calcium: 8.2 mg/dL — ABNORMAL LOW (ref 8.9–10.3)
Chloride: 104 mmol/L (ref 98–111)
Creatinine, Ser: 0.61 mg/dL (ref 0.61–1.24)
GFR, Estimated: >60 mL/min
Glucose, Bld: 101 mg/dL — ABNORMAL HIGH (ref 70–99)
Potassium: 4 mmol/L (ref 3.5–5.1)
Sodium: 137 mmol/L (ref 135–145)

## 2024-09-10 LAB — BPAM RBC
Blood Product Expiration Date: 202602282359
Blood Product Expiration Date: 202603022359
Blood Product Expiration Date: 202603032359
ISSUE DATE / TIME: 202602020620
ISSUE DATE / TIME: 202602021539
ISSUE DATE / TIME: 202602030926
Unit Type and Rh: 202603022359
Unit Type and Rh: 5100
Unit Type and Rh: 5100
Unit Type and Rh: 5100

## 2024-09-10 LAB — TYPE AND SCREEN
ABO/RH(D): O POS
Antibody Screen: NEGATIVE
Unit division: 0
Unit division: 0
Unit division: 0

## 2024-09-10 LAB — CBC
HCT: 26.2 % — ABNORMAL LOW (ref 39.0–52.0)
Hemoglobin: 8.6 g/dL — ABNORMAL LOW (ref 13.0–17.0)
MCH: 30.4 pg (ref 26.0–34.0)
MCHC: 32.8 g/dL (ref 30.0–36.0)
MCV: 92.6 fL (ref 80.0–100.0)
Platelets: 391 10*3/uL (ref 150–400)
RBC: 2.83 MIL/uL — ABNORMAL LOW (ref 4.22–5.81)
RDW: 14.3 % (ref 11.5–15.5)
WBC: 11.6 10*3/uL — ABNORMAL HIGH (ref 4.0–10.5)
nRBC: 0.4 % — ABNORMAL HIGH (ref 0.0–0.2)

## 2024-09-10 LAB — PROTIME-INR
INR: 1.3 — ABNORMAL HIGH (ref 0.8–1.2)
Prothrombin Time: 17 s — ABNORMAL HIGH (ref 11.4–15.2)

## 2024-09-10 LAB — MAGNESIUM: Magnesium: 1.7 mg/dL (ref 1.7–2.4)

## 2024-09-10 MED ORDER — AMIODARONE HCL IN DEXTROSE 360-4.14 MG/200ML-% IV SOLN
30.0000 mg/h | INTRAVENOUS | Status: DC
  Administered 2024-09-10 – 2024-09-11 (×2): 30 mg/h via INTRAVENOUS
  Filled 2024-09-10 (×2): qty 200

## 2024-09-10 MED ORDER — IOHEXOL 300 MG/ML  SOLN
75.0000 mL | Freq: Once | INTRAMUSCULAR | Status: AC | PRN
  Administered 2024-09-10: 75 mL via INTRAVENOUS

## 2024-09-10 MED ORDER — DEXAMETHASONE SOD PHOSPHATE PF 10 MG/ML IJ SOLN
10.0000 mg | INTRAMUSCULAR | Status: AC
  Administered 2024-09-10: 10 mg via INTRAVENOUS
  Filled 2024-09-10: qty 1

## 2024-09-10 MED ORDER — TALC (STERITALC) POWDER FOR INTRAPLEURAL USE
4.0000 g | Freq: Once | INTRAPLEURAL | Status: DC
  Filled 2024-09-10: qty 4

## 2024-09-10 MED ORDER — SENNA 8.6 MG PO TABS
1.0000 | ORAL_TABLET | Freq: Every day | ORAL | Status: AC
  Administered 2024-09-10 – 2024-09-12 (×3): 8.6 mg via ORAL
  Filled 2024-09-10 (×3): qty 1

## 2024-09-10 MED ORDER — DEXAMETHASONE SODIUM PHOSPHATE 4 MG/ML IJ SOLN
4.0000 mg | Freq: Four times a day (QID) | INTRAMUSCULAR | Status: AC
  Administered 2024-09-11 – 2024-09-12 (×9): 4 mg via INTRAVENOUS
  Filled 2024-09-10 (×10): qty 1

## 2024-09-10 MED ORDER — AMIODARONE LOAD VIA INFUSION
150.0000 mg | Freq: Once | INTRAVENOUS | Status: AC
  Administered 2024-09-10: 150 mg via INTRAVENOUS
  Filled 2024-09-10: qty 83.34

## 2024-09-10 MED ORDER — AMIODARONE HCL IN DEXTROSE 360-4.14 MG/200ML-% IV SOLN
60.0000 mg/h | INTRAVENOUS | Status: AC
  Administered 2024-09-10 (×2): 60 mg/h via INTRAVENOUS
  Filled 2024-09-10 (×3): qty 200

## 2024-09-10 MED ORDER — LIDOCAINE HCL 1 % IJ SOLN
20.0000 mL | Freq: Once | INTRAMUSCULAR | Status: DC
  Filled 2024-09-10: qty 20

## 2024-09-10 NOTE — Progress Notes (Signed)
" °   09/10/24 1513  Assess: MEWS Score  Temp 98.7 F (37.1 C)  BP 113/75  MAP (mmHg) 87  Pulse Rate (!) 148  Resp 20  Level of Consciousness Alert  SpO2 98 %  O2 Device Room Air  Assess: MEWS Score  MEWS Temp 0  MEWS Systolic 0  MEWS Pulse 3  MEWS RR 0  MEWS LOC 0  MEWS Score 3  MEWS Score Color Yellow  Assess: if the MEWS score is Yellow or Red  Were vital signs accurate and taken at a resting state? Yes  Does the patient meet 2 or more of the SIRS criteria? No  MEWS guidelines implemented  Yes, yellow  Treat  MEWS Interventions Considered administering scheduled or prn medications/treatments as ordered  Take Vital Signs  Increase Vital Sign Frequency  Yellow: Q2hr x1, continue Q4hrs until patient remains green for 12hrs  Escalate  MEWS: Escalate Yellow: Discuss with charge nurse and consider notifying provider and/or RRT  Notify: Charge Nurse/RN  Name of Charge Nurse/RN Notified Megan RN  Provider Notification  Provider Name/Title Dr Maranda  Date Provider Notified 09/10/24  Time Provider Notified 1515  Method of Notification Page  Notification Reason  (yellow mews, elevated HR, EKG-a fib, RVR)  Provider response See new orders  Date of Provider Response 09/10/24  Time of Provider Response 1525  Assess: SIRS CRITERIA  SIRS Temperature  0  SIRS Respirations  0  SIRS Pulse 1  SIRS WBC 0  SIRS Score Sum  1   12 lead EKG obtained, results called to Dr Maranda. Taichi Repka, Lonell Louder, RN  "

## 2024-09-10 NOTE — Significant Event (Signed)
 Call from radiology about 6:55 PM concerning Head CT w and wo contrast.  Cerebellar vermis bleed, most likely underlying metastases.  Some pressure on the fourth ventricle but no hydrocephalus.  I went back to the room and examined the patient about 7:30 PM.  He was asleep but awoke readily.  Was oriented to [facility] and knew it was February 2026.  Complained of some posterior headache.  Also dizziness.  Strength was intact in all 4 limbs.  He actually was quite alert and looked unchanged from this morning.  I discussed with Dr. Olena, Dr. Federico, and Dr. Onetha of neurosurgery.  This point we will start him on dexamethasone  10 mg IV x 1 then 4 mg IV every 6 hours.  Await neurosurgical evaluation.  Per Dr. Federico he does have an actionable mutation so may be able to use oral chemotherapy with a reasonable chance of a good response. Next step MRI w and w/o contrast? I will defer timing of scan to NSG. He is not on coumadin , NSAID's including ASA or DOAC.  PT/INR 1.3, Hb 8.6, higher than yesterday.

## 2024-09-10 NOTE — Consult Note (Signed)
 Reason for Consult: Cerebellar vermis and possible hemorrhagic mass Referring Physician: [facility] hospitalist  Rick Edwards is an 69 y.o. male.  HPI: 69 year old gentleman with multiple medical problems to include Antithrombin thrombin 3 deficiency to which he had been on Coumadin  admission INR was 5.7 on the 29th.  Also.  History of lung cancer metastatic GI bleed and large pleural effusion and has been extremely debilitated and sick for the last several weeks and an out-of-hospital for management of his chronic medical conditions.  Currently the patient is complaining of a mild headache but no nausea  Past Medical History:  Diagnosis Date   Anemia    anemia thrombocytopenia, saw Hematology 2011, can not r/o myeloproliferative d/c   Antithrombin III  deficiency 05/16/2013   On coumadin  for this   Blood clot in vein    in right leg   Chronic kidney disease 05/27/2013   ACUTE RENAL FAILURE    Crohn's disease (HCC)    tx. Imuran    GI bleed 6/11   w/ normal EGD 6/11, 3 PRBCs    Hearing loss    wears hearing aid left ear; birthed with hearing loss   HOH (hard of hearing) 05/16/2013   Hypercalcemia    Hyperkalemia 05/27/2013   Hypertension    Ileostomy in place [facility]) 04-11-13   04-18-13 ileostomy to be taken down.   Perianal abscess 2001   s/p right hemicolectomy, and drainage of retroperitoneal abcess 2003   Peripheral vascular disease    Transfusion history    last 8'13    Past Surgical History:  Procedure Laterality Date   Anal fistulotomy  2002   BRONCHIAL NEEDLE ASPIRATION BIOPSY  08/14/2024   Procedure: BRONCHOSCOPY, WITH NEEDLE ASPIRATION BIOPSY;  Surgeon: Zaida Zola SAILOR, MD;  Location: [facility];  Service: Pulmonary;;   CRYOTHERAPY  08/14/2024   Procedure: CRYOTHERAPY;  Surgeon: Zaida Zola SAILOR, MD;  Location: [facility] ENDOSCOPY;  Service: Pulmonary;;   EMBOLECTOMY  03/12/2012   Procedure: EMBOLECTOMY;  Surgeon: Lonni GORMAN Blade, MD;  Location: [facility] OR;  Service: Vascular;   Laterality: Right;  Right popliteal embolectomy with vein patch angioplasty, right posterior tibial embolectomy with vein patch angioplasty    ESOPHAGOGASTRODUODENOSCOPY N/A 12/12/2023   Procedure: EGD (ESOPHAGOGASTRODUODENOSCOPY);  Surgeon: Burnette Fallow, MD;  Location: [facility];  Service: Gastroenterology;  Laterality: N/A;   ESOPHAGOGASTRODUODENOSCOPY N/A 08/11/2024   Procedure: EGD (ESOPHAGOGASTRODUODENOSCOPY);  Surgeon: Dianna Specking, MD;  Location: [facility] ENDOSCOPY;  Service: Gastroenterology;  Laterality: N/A;   ESOPHAGOGASTRODUODENOSCOPY N/A 09/08/2024   Procedure: EGD (ESOPHAGOGASTRODUODENOSCOPY);  Surgeon: Rosalie Kitchens, MD;  Location: [facility];  Service: Gastroenterology;  Laterality: N/A;   HEMICOLECTOMY  08/29/2001   perforated abscess   HEMOSTASIS CLIP PLACEMENT  08/11/2024   Procedure: CONTROL OF HEMORRHAGE, GI TRACT, ENDOSCOPIC, BY HEMOSTATIC POWDER APPLICATION;  Surgeon: Dianna Specking, MD;  Location: [facility] ENDOSCOPY;  Service: Gastroenterology;;   ILEOSTOMY CLOSURE N/A 04/18/2013   Procedure: ILEOSTOMY TAKEDOWN;  Surgeon: Morene ONEIDA Olives, MD;  Location: [facility] ORS;  Service: General;  Laterality: N/A;   INTRAOPERATIVE ARTERIOGRAM  03/12/2012   Procedure: INTRA OPERATIVE ARTERIOGRAM;  Surgeon: Lonni GORMAN Blade, MD;  Location: [facility] OR;  Service: Vascular;  Laterality: Right;   PARTIAL COLECTOMY  03/11/2012   Procedure: PARTIAL COLECTOMY;  Surgeon: Morene ONEIDA Olives, MD;  Location: [facility] ORS;  Service: General;  Laterality: N/A;  subtotal colectomy transverse and left colon    TRANSMETATARSAL AMPUTATION  05/10/2012   right   UPPER ESOPHAGEAL ENDOSCOPIC ULTRASOUND (EUS) N/A 12/12/2023   Procedure:  UPPER ESOPHAGEAL ENDOSCOPIC ULTRASOUND (EUS);  Surgeon: Burnette Fallow, MD;  Location: [facility];  Service: Gastroenterology;  Laterality: N/A;   VIDEO BRONCHOSCOPY WITH ENDOBRONCHIAL NAVIGATION Left 08/14/2024   Procedure: VIDEO BRONCHOSCOPY WITH ENDOBRONCHIAL NAVIGATION;  Surgeon: Zaida Zola SAILOR, MD;  Location: [facility];  Service: Pulmonary;  Laterality: Left;   VIDEO BRONCHOSCOPY WITH ENDOBRONCHIAL ULTRASOUND  08/14/2024   Procedure: BRONCHOSCOPY, WITH EBUS;  Surgeon: Zaida Zola SAILOR, MD;  Location: [facility];  Service: Pulmonary;;   VIDEO BRONCHOSCOPY WITH RADIAL ENDOBRONCHIAL ULTRASOUND  08/14/2024   Procedure: VIDEO BRONCHOSCOPY WITH RADIAL ENDOBRONCHIAL ULTRASOUND;  Surgeon: Zaida Zola SAILOR, MD;  Location: [facility];  Service: Pulmonary;;    Family History  Problem Relation Age of Onset   Hypertension Mother    Hyperlipidemia Mother    Hypertension Father    Hyperlipidemia Father    Heart disease Father    Stroke Other        GF, aunts    Hypertension Other        the whole family   Coronary artery disease Neg Hx    Diabetes Neg Hx    Colon cancer Neg Hx    Prostate cancer Neg Hx    Kidney failure Neg Hx     Social History:  reports that he quit smoking about 13 years ago. His smoking use included cigarettes. He started smoking about 22 years ago. He has a 2.3 pack-year smoking history. He has never used smokeless tobacco. He reports that he does not currently use drugs after having used the following drugs: Marijuana. He reports that he does not drink alcohol.  Allergies: Allergies[1]  Medications: I have reviewed the patient's current medications.  Results for orders placed or performed during the hospital encounter of 09/04/24 (from the past 48 hours)  Hemoglobin and hematocrit, blood     Status: Abnormal   Collection Time: 09/08/24  9:31 PM  Result Value Ref Range   Hemoglobin 7.4 (L) 13.0 - 17.0 g/dL   HCT 77.3 (L) 60.9 - 47.9 %    Comment: Performed at [facility], [address] [place], [place] [address]  Strep pneumoniae urinary antigen     Status: None   Collection Time: 09/09/24  4:00 AM  Result Value Ref Range   Strep Pneumo Urinary Antigen NEGATIVE NEGATIVE    Comment:        Infection due to S.  pneumoniae cannot be absolutely ruled out since the antigen present may be below the detection limit of the test. Performed at [facility], [address] [place], [place] [address]   Protime-INR     Status: Abnormal   Collection Time: 09/09/24  5:36 AM  Result Value Ref Range   Prothrombin Time 16.1 (H) 11.4 - 15.2 seconds   INR 1.2 0.8 - 1.2    Comment: (NOTE) INR goal varies based on device and disease states. Performed at [facility], [address] [place], [place] [address]   CBC     Status: Abnormal   Collection Time: 09/09/24  5:36 AM  Result Value Ref Range   WBC 15.5 (H) 4.0 - 10.5 K/uL   RBC 2.44 (L) 4.22 - 5.81 MIL/uL   Hemoglobin 7.3 (L) 13.0 - 17.0 g/dL   HCT 77.7 (L) 60.9 - 47.9 %   MCV 91.0 80.0 - 100.0 fL   MCH 29.9 26.0 - 34.0 pg   MCHC 32.9 30.0 - 36.0 g/dL   RDW 85.3 88.4 -  15.5 %   Platelets 372 150 - 400 K/uL   nRBC 0.3 (H) 0.0 - 0.2 %    Comment: Performed at [facility], [address], [place], [place] [address]  Basic metabolic panel with GFR     Status: Abnormal   Collection Time: 09/09/24  5:36 AM  Result Value Ref Range   Sodium 135 135 - 145 mmol/L   Potassium 4.4 3.5 - 5.1 mmol/L   Chloride 103 98 - 111 mmol/L   CO2 25 22 - 32 mmol/L   Glucose, Bld 102 (H) 70 - 99 mg/dL    Comment: Glucose reference range applies only to samples taken after fasting for at least 8 hours.   BUN 29 (H) 8 - 23 mg/dL   Creatinine, Ser 9.21 0.61 - 1.24 mg/dL   Calcium  8.1 (L) 8.9 - 10.3 mg/dL   GFR, Estimated >39 >39 mL/min    Comment: (NOTE) Calculated using the CKD-EPI Creatinine Equation (2021)    Anion gap 7 5 - 15    Comment: Performed at [facility], [address], [place], [place] [address]  Prepare RBC (crossmatch)     Status: None   Collection Time: 09/09/24  7:05 AM  Result Value Ref Range   Order Confirmation      ORDER PROCESSED BY BLOOD BANK Performed at [facility], [address], [place], [place] [address]   Hemoglobin and hematocrit, blood     Status: Abnormal   Collection Time: 09/09/24  2:56 PM  Result Value Ref Range   Hemoglobin 8.0 (L) 13.0 - 17.0 g/dL   HCT 76.2 (L) 60.9 - 47.9 %    Comment: Performed at [facility], [address], [place], [place] [address]  Protime-INR     Status: Abnormal   Collection Time: 09/10/24  5:24 AM  Result Value Ref Range   Prothrombin Time 17.0 (H) 11.4 - 15.2 seconds   INR 1.3 (H) 0.8 - 1.2    Comment: (NOTE) INR goal varies based on device and disease states. Performed at [facility], [address] [place], [place] [address]   CBC     Status: Abnormal   Collection Time: 09/10/24  5:24 AM  Result Value Ref Range   WBC 11.6 (H) 4.0 - 10.5 K/uL   RBC 2.83 (L) 4.22 - 5.81 MIL/uL   Hemoglobin 8.6 (L) 13.0 - 17.0 g/dL   HCT 73.7 (L) 60.9 - 47.9 %   MCV 92.6 80.0 - 100.0 fL   MCH 30.4 26.0 - 34.0 pg   MCHC 32.8 30.0 - 36.0 g/dL   RDW 85.6 88.4 - 84.4 %   Platelets 391 150 - 400 K/uL   nRBC 0.4 (H) 0.0 - 0.2 %    Comment: Performed at [facility], [address] [place], [place] [address]  Basic metabolic panel with GFR     Status: Abnormal   Collection Time: 09/10/24  5:24 AM  Result Value Ref Range   Sodium 137 135 - 145 mmol/L   Potassium 4.0 3.5 - 5.1 mmol/L   Chloride 104 98 - 111 mmol/L   CO2 26 22 - 32 mmol/L   Glucose, Bld 101 (H) 70 - 99 mg/dL    Comment: Glucose reference range applies only to samples taken after fasting for at least 8 hours.   BUN 11 8 - 23 mg/dL   Creatinine, Ser 9.38 0.61 - 1.24 mg/dL  Calcium  8.2 (L) 8.9 - 10.3 mg/dL   GFR, Estimated >39 >39 mL/min    Comment: (NOTE) Calculated using the CKD-EPI Creatinine Equation (2021)    Anion gap 7 5 - 15    Comment: Performed at [facility], [address], [place], [place] [address]  Magnesium      Status: None    Collection Time: 09/10/24  5:24 AM  Result Value Ref Range   Magnesium  1.7 1.7 - 2.4 mg/dL    Comment: Performed at [facility], [address], [place], [place] [address]    CT HEAD W & WO CONTRAST ( ) Result Date: 09/10/2024 EXAM: CT HEAD WITHOUT AND WITH CONTRAST 09/10/2024 05:59:37 PM TECHNIQUE: CT of the head was performed without and with the administration of 75 mL of iohexol  (OMNIPAQUE ) 300 MG/ML solution. Automated exposure control, iterative reconstruction, and/or weight based adjustment of the mA/kV was utilized to reduce the radiation dose to as low as reasonably achievable. COMPARISON: None available. CLINICAL HISTORY: Brain metastases suspected. FINDINGS: BRAIN AND VENTRICLES: There is a 3.9 x 2.9 x 3.6 cm hemorrhagic focus in the posterior fossa centered within the cerebellar vermis. There is surrounding edema and significant associated mass effect on the fourth ventricle. There is no evidence of significant dilation of the lateral ventricles and third ventricle. Findings are concerning for hemorrhagic mass within the posterior fossa likely in the setting of metastatic disease. Recommend contrast enhanced MRI brain for further evaluation. There are no additional areas of intracranial hemorrhage noted. There are no CT findings to suggest enhancing mass within the supratentorial parenchyma. Evaluation for enhancing mass in the posterior fossa is limited due to the degree of hemorrhage. No significant midline shift. Atherosclerosis of the carotid siphons. No extra-axial fluid collection. No evidence of acute infarct. ORBITS: No acute abnormality. SINUSES AND MASTOIDS: No acute abnormality. SOFT TISSUES AND SKULL: No focal bone lesion. No acute soft tissue abnormality. IMPRESSION: 1. 3.9 cm focus of hemorrhage in the posterior fossa centered within the cerebellar vermis with surrounding edema and significant mass effect on the fourth ventricle. Findings concerning for  hemorrhagic mass, in particular metastatic disease. Recommend contrast-enhanced MRI brain for further evaluation. Electronically signed by: Rick Mania MD 09/10/2024 06:26 PM EST RP Workstation: [id number]   DG CHEST PORT 1 VIEW Result Date: 09/10/2024 EXAM: 1 VIEW XRAY OF THE CHEST 09/10/2024 04:58:00 AM COMPARISON: Chest yesterday at 09/09/2024 07:07:00 AM. CLINICAL HISTORY: 142230 Pleural effusion 142230 Pleural effusion. FINDINGS: LINES, TUBES AND DEVICES: A pigtail chest tube in the lower right thorax is unchanged in position. Overlying telemetry leads. LUNGS AND PLEURA: Right basilar airspace disease is also improved but not resolved. Left lung is clear. There is chronic vascular prominence of the left hilum. A small right pleural effusion appears improved. No pneumothorax. HEART AND MEDIASTINUM: Mild cardiomegaly with no evidence of CHF. Stable mediastinal configuration. BONES AND SOFT TISSUES: No acute osseous abnormality. IMPRESSION: 1. Small right pleural effusion, improved. 2. Pigtail chest tube in the lower right thorax, unchanged in position. 3. Improved right basilar airspace disease, not resolved. Electronically signed by: Rick Quam MD 09/10/2024 07:36 AM EST RP Workstation: [id number]   DG CHEST PORT 1 VIEW Result Date: 09/09/2024 CLINICAL DATA:  Follow up pleural effusion. EXAM: PORTABLE CHEST 1 VIEW COMPARISON:  Abdominopelvic CT 09/08/2024. Chest radiographs 09/08/2024 and 09/07/2024. FINDINGS: 0707 hours. Small caliber pigtail right chest tube is unchanged in position. Unchanged right pleural effusion and right basilar airspace disease. No evidence of pneumothorax. The left  lung is clear. The visualized heart size and mediastinal contours are stable. No acute osseous findings are seen. IMPRESSION: Unchanged right pleural effusion and right basilar airspace disease. No evidence of pneumothorax. Electronically Signed   By: Rick Edwards M.D.   On: 09/09/2024 11:05    Review of Systems   Neurological:  Positive for dizziness and headaches.   Blood pressure 110/69, pulse (!) 131, temperature 99.1 F (37.3 C), temperature source Oral, resp. rate (!) 23, height 5' 9 (1.753 m), weight 92.5 kg, SpO2 94%. Physical Exam Neurological:     Comments: Patient is awake without complaints he is oriented to name and he would not answer simple questions with regard to other orientations.   cranial nerves are intact tongue is midline strength is 5 out of 5 upper and lower extremities somewhat effort dependent but symmetric without signs of hemiparesis.     Assessment/Plan: 69 year old gentleman with post medical problems and metastatic carcinoma possible lung mass also saw a reference to GI.  I think the biggest concern at this point in decision needs to be what is his patient's prognosis.  The vermian hemorrhage and or underlying hemorrhagic mass will need to be worked up if this patient had a favorable prognosis would start with an MRI scan of his brain and decisions being made as to whether surgery versus radiation.  However on the surface looking at the patient looking at his medical condition all his medical problems I do not think he is a good surgical candidate.  I also do not feel that he needs an external ventricular drain although this is a concern if this were to hemorrhage more and get larger could start compressing his fourth ventricle which it does not do now he does not have hydrocephalus.  So I think a primary medical service along with medical oncology needs to make some decisions on prognosis.  If we proceed aggressively patient would need to be transferred to [facility] observed in the [facility] ICU for this hemorrhage for potential worsening hydrocephalus undergo the MRI scan of his brain and decisions following based on that.  I would also put in that because this is a single CAT scan we have of his head the hemorrhage does have varying densities it is possible this may be a few days old and  in fact the case it may have already be on the tail end of an observation period for expansion.  He still has a slightly elevated INR still has a slightly elevated pro time despite the fact that he is off Coumadin .  All this does factor into significant increased risk with any procedure ventricular drain or surgery.  Will await determination from medical oncology and the primary medical service however would favor non-surgical approach to this either radiation or palliative treatment  Arley SHAUNNA Helling 09/10/2024, 8:03 PM         [1]  Allergies Allergen Reactions   Olmesartan      Hx of severe AKI requiring HD on ACE per renal note, hx of AKI and hypotension with olmesartan  07/2023   Mesalamine Rash    REACTION: Rash

## 2024-09-10 NOTE — Progress Notes (Signed)
 " PROGRESS NOTE    Rick Edwards  FMW:[id number] DOB: 09/20/1955 DOA: 09/04/2024 PCP: Joshua Debby CROME, MD    Brief Narrative:   Rick Edwards is a 69 y.o. male with past medical history significant for Antithrombin III  deficiency on warfarin, Crohn's disease s/p ileostomy, HTN, recent upper GI bleed secondary to duodenal ulcer, right malignant pleural effusion recent diagnosis of adenocarcinoma of unknown primary (concern for GI vs Lung vs RCC) who presented to [facility] ED via EMS from home with generalized abdominal pain, shortness of breath.  Recently discharged following thoracentesis.  Since arriving back home his right-sided chest pain and shortness of breath has gradually worsened.  Worse with exertion.  Associated with mild nausea but no vomiting, no fevers.  Additionally with generalized weakness and difficulty performing his ADLs.  No further evidence of GI bleed, dark tarry stools.  Has been compliant with his home medication including Coumadin .  In the ED, temperature 97.3 F, HR 110, RR 25, BP 117/77, O2 to 100% on room air.  WBC 10.5, Huma 12.2, platelet count 513.  Sodium 138, potassium 4.5, chloride 100, CO2 26, glucose 146, BUN 18, creat 1.06.  AST 19, ALT 20, total bilirubin 0.8.  INR 5.7.  Urinalysis unrevealing.  Chest x-ray with large right pleural effusion with complete opacification of the right hemithorax, no acute radiographic abnormality in the left lung.  TRH consulted for admission for recurrent large right malignant pleural effusion.  Assessment & Plan:   Large right malignant pleural effusion, recurrent s/p chest tube Right lower lobe pneumonia Patient presents with right-sided chest pain, shortness of breath, generalized weakness.  Recently diagnosed with adenocarcinoma of unknown clear primary following most recent hospitalization with thoracentesis and bronchoscopy.  Imaging previously noted left upper lobe nodule, right renal lesion concerning for RCC  versus GI source.  Patient is afebrile.  Chest x-ray with large right pleural effusion with complete opacification of the right hemithorax.  PCCM was consulted and underwent thoracentesis on 09/04/2024 with removal of 900 mL of turbid fluid.  CT angiogram GI bleed study 2/2 with findings of dense consolidation right lower lobe with complex fluid in the right pleural space. -- PCCM following, appreciate assistance -- Chest tube placement pulmonology 1/30 -- Chest tube output: 410 mL past 24h -- Azithromycin  500 mg p.o. daily x 5 days -- Ceftriaxone  2 g IV every 24 hours x 5 days -- Continue supplemental oxygen, maintain SpO2 greater than 92% -- Patient is considering his options which include talc  pleurodesis or PleurX catheter.  He wants to talk it over with his sister.  Upper GI bleed 2/2 duodenal ulcer, recurrent Recent EGD 08/11/2024 with findings of oozing cratered duodenal ulcer s/p hemostatic spray with resolution of bleeding.  Patient with recurrence of bloody bowel movements 2/2, CT angiogram GI bleed study with findings of extravasation duodenal bulb on arterial phase imaging, prominent mural vascularity low rectum on arterial phase concerning for angiodysplasia.  Patient underwent EGD on 09/08/2024 with findings of LA grade a esophagitis, 1 nonbleeding cratered duodenal ulcer with adherent clot duodenal bulb, gastric lipoma.  -- [facility] following, appreciate assistance -- Hgb 12.2>11.5>11.7>10.6>7.7>7.4>7.3 -- s/p 2u pRBC 2/2 -- repeat tranfusion 1u pRBC today; repeat H&H 2 hours following transfusion -- Clear liquid diet x 2 days -- CBC daily -- If further significant bleeding, GI recommends consideration of IR consult for embolization duodenal bulb  Adenocarcinoma, unclear primary Concern for lung versus GI versus renal etiology.  Follows with medical oncology, Dr.  Dorsey. He had biopsy done via bronchoscopy with pulmonology at [facility] earlier this month. He was scheduled for  outpatient PET scan 09/05/2024; but due to recurrent hospitalization will need to reschedule.  Antithrombin III  deficiency Supratherapeutic INR Chronically on Coumadin .  INR elevated 5.7 on admission.  Received 2.5 mg p.o. vitamin K  and 5mg  on 1/30 -- INR 5.7>4.2>2.0>1.2 -- Holding warfarin for now -- INR daily  HTN At baseline on amlodipine  10 mg p.o. daily, Carvedilol  25 mg p.o. twice daily, Spironolactone  25 mg p.o. daily -- Hold antihypertensives given acute blood loss anemia secondary to recurrent GI bleed, hypotension  HLD Peripheral vascular disease -- Atorvastatin  40 mg p.o. daily  CKD stage IIIa -- Cr 1.06>0.92>0.99>0.78, stable  History of Crohn's disease Follows with [facility] gastroenterology outpatient; Dr. Elicia.  Not on immunosuppressants outpatient  DM2 Diet controlled at baseline.  Last hemoglobin A1c 6.2%.   DVT prophylaxis: Place and maintain sequential compression device Start: 09/08/24 9361 SCDs Start: 09/04/24 1242    Code Status: Full Code Family Communication: No family present at bedside this morning  Disposition Plan:  Level of care: Progressive Status is: Inpatient Remains inpatient appropriate because: Chest tube remains in place; needs further advancement of diet, SNF placement    Consultants:  PCCM Palliative Care [facility] gastroenterology  Procedures:  Right thoracentesis, PCCM 1/29 Chest tube, PCCM 1/30  Antimicrobials:  Azithromycin  2/2>> Ceftriaxone  2/2>>   Subjective: Patient is fatigued today.  Nods off frequently during my interview.  Denies any pain at present.  Tells me he wants to speak to his sister before he makes a decision about any procedures.  Objective: Vitals:   09/10/24 0443 09/10/24 1333 09/10/24 1513 09/10/24 1607  BP: 110/61 134/75 113/75 110/77  Pulse: 97 88 (!) 148 (!) 167  Resp: 15 16 20  (!) 28  Temp: 97.6 F (36.4 C) 98.3 F (36.8 C) 98.7 F (37.1 C)   TempSrc: Oral Oral Oral   SpO2: 100% 97% 98%  96%  Weight:      Height:        Intake/Output Summary (Last 24 hours) at 09/10/2024 1732 Last data filed at 09/10/2024 1600 Gross per 24 hour  Intake 415 ml  Output 2390 ml  Net -1975 ml   Filed Weights   09/04/24 0912  Weight: 92.5 kg    Examination:  Physical Exam: GEN: Mild respiratory distress, alert and oriented x 3, chronically ill appearance, appears older than stated age, cachectic HEENT: NCAT, PERRL, EOMI, sclera clear, MMM PULM: Diminished breath sounds both bases, CTA left chest without wheezing/crackles, normal respiratory effort without accessory muscle use, on room air with SpO2 100% at rest.   CV: RRR w/o M/G/R GI: abd soft, NTND, + BS MSK: no peripheral edema, moves all extremities independently, generally muscle wasted throughout NEURO: No focal neurological deficit PSYCH: normal mood/affect Integumentary: No concerning rashes/lesions/wounds noted on exposed skin surfaces    Data Reviewed: I have personally reviewed following labs and imaging studies  CBC: Recent Labs  Lab 09/07/24 0626 09/08/24 0144 09/08/24 1102 09/08/24 1206 09/08/24 1540 09/08/24 2131 09/09/24 0536 09/09/24 1456 09/10/24 0524  WBC 11.5* 16.9* 18.5*  --   --   --  15.5*  --  11.6*  HGB 10.1* 7.7* 10.0*   < > 7.7* 7.4* 7.3* 8.0* 8.6*  HCT 29.8* 23.2* 28.9*   < > 23.4* 22.6* 22.2* 23.7* 26.2*  MCV 91.4 90.3 88.4  --   --   --  91.0  --  92.6  PLT 454* 502* 429*  --   --   --  372  --  391   < > = values in this interval not displayed.   Basic Metabolic Panel: Recent Labs  Lab 09/05/24 0649 09/06/24 0538 09/08/24 1102 09/08/24 1206 09/09/24 0536 09/10/24 0524  NA 137 134* 132* 133* 135 137  K 5.0 4.7 5.4* 5.3* 4.4 4.0  CL 101 98 101 99 103 104  CO2 28 30 21*  --  25 26  GLUCOSE 142* 139* 102* 102* 102* 101*  BUN 22 21 44* 44* 29* 11  CREATININE 0.92 0.99 0.96 1.20 0.78 0.61  CALCIUM  9.8 8.9 8.1*  --  8.1* 8.2*  MG  --   --   --   --   --  1.7   GFR: Estimated  Creatinine Clearance: 102 mL/min (by C-G formula based on SCr of 0.61 mg/dL). Liver Function Tests: Recent Labs  Lab 09/04/24 1026 09/04/24 1624  AST 19  --   ALT 20  --   ALKPHOS 108  --   BILITOT 0.8  --   PROT 7.4 6.9  ALBUMIN  3.9  --    Recent Labs  Lab 09/04/24 1026  LIPASE 27   No results for input(s): AMMONIA in the last 168 hours. Coagulation Profile: Recent Labs  Lab 09/05/24 0649 09/06/24 0538 09/08/24 1102 09/09/24 0536 09/10/24 0524  INR 4.2* 2.0* 1.1 1.2 1.3*   Cardiac Enzymes: No results for input(s): CKTOTAL, CKMB, CKMBINDEX, TROPONINI in the last 168 hours. BNP (last 3 results) Recent Labs    08/08/24 1227  PROBNP <50.0   HbA1C: No results for input(s): HGBA1C in the last 72 hours. CBG: No results for input(s): GLUCAP in the last 168 hours. Lipid Profile: No results for input(s): CHOL, HDL, LDLCALC, TRIG, CHOLHDL, LDLDIRECT in the last 72 hours. Thyroid  Function Tests: No results for input(s): TSH, T4TOTAL, FREET4, T3FREE, THYROIDAB in the last 72 hours. Anemia Panel: No results for input(s): VITAMINB12, FOLATE, FERRITIN, TIBC, IRON, RETICCTPCT in the last 72 hours. Sepsis Labs: No results for input(s): PROCALCITON, LATICACIDVEN in the last 168 hours.  Recent Results (from the past 240 hours)  Body fluid culture w Gram Stain     Status: None   Collection Time: 09/04/24  3:49 PM   Specimen: Pleural Fluid  Result Value Ref Range Status   Specimen Description   Final    PLEURAL Performed at [facility], [address] [place], [place] [address]    Special Requests   Final    NONE Performed at [facility], [address] [place], [place] [address]    Gram Stain NO WBC SEEN NO ORGANISMS SEEN   Final   Culture   Final    NO GROWTH 3 DAYS Performed at [facility], [address], [place], [place] [address]    Report Status 09/08/2024 FINAL   Final         Radiology Studies: DG CHEST PORT 1 VIEW Result Date: 09/10/2024 EXAM: 1 VIEW XRAY OF THE CHEST 09/10/2024 04:58:00 AM COMPARISON: Chest yesterday at 09/09/2024 07:07:00 AM. CLINICAL HISTORY: 142230 Pleural effusion 142230 Pleural effusion. FINDINGS: LINES, TUBES AND DEVICES: A pigtail chest tube in the lower right thorax is unchanged in position. Overlying telemetry leads. LUNGS AND PLEURA: Right basilar airspace disease is also improved but not resolved. Left lung is clear. There is chronic vascular prominence of the left hilum. A small right pleural effusion appears improved.  No pneumothorax. HEART AND MEDIASTINUM: Mild cardiomegaly with no evidence of CHF. Stable mediastinal configuration. BONES AND SOFT TISSUES: No acute osseous abnormality. IMPRESSION: 1. Small right pleural effusion, improved. 2. Pigtail chest tube in the lower right thorax, unchanged in position. 3. Improved right basilar airspace disease, not resolved. Electronically signed by: Francis Quam MD 09/10/2024 07:36 AM EST RP Workstation: [id number]   DG CHEST PORT 1 VIEW Result Date: 09/09/2024 CLINICAL DATA:  Follow up pleural effusion. EXAM: PORTABLE CHEST 1 VIEW COMPARISON:  Abdominopelvic CT 09/08/2024. Chest radiographs 09/08/2024 and 09/07/2024. FINDINGS: 0707 hours. Small caliber pigtail right chest tube is unchanged in position. Unchanged right pleural effusion and right basilar airspace disease. No evidence of pneumothorax. The left lung is clear. The visualized heart size and mediastinal contours are stable. No acute osseous findings are seen. IMPRESSION: Unchanged right pleural effusion and right basilar airspace disease. No evidence of pneumothorax. Electronically Signed   By: Elsie Perone M.D.   On: 09/09/2024 11:05        Scheduled Meds:  acetaminophen   1,000 mg Oral TID   azithromycin   500 mg Oral Daily   feeding supplement  1 Container Oral TID BM   lidocaine   20 mL Intrapleural Once    pantoprazole  (PROTONIX ) IV  40 mg Intravenous Q12H   senna  1 tablet Oral QHS   sodium chloride  flush  10 mL Intrapleural Q6H   Continuous Infusions:  sodium chloride  75 mL/hr at 09/10/24 1447   amiodarone  60 mg/hr (09/10/24 1555)   Followed by   amiodarone      cefTRIAXone  (ROCEPHIN )  IV 2 g (09/10/24 0850)     LOS: 6 days    Time spent: 38 minutes    Lonni KANDICE Moose, DO [facility] Available via Epic secure chat 7am-7pm After these hours, please refer to coverage provider listed on [email or web address] 09/10/2024, 5:32 PM   "

## 2024-09-10 NOTE — Progress Notes (Signed)
 "                                                                                                                                                                                                          Daily Progress Note   Patient Name: Rick Edwards       Date: 09/10/2024 DOB: 1956-04-17  Age: 69 y.o. MRN#: [id number] Attending Physician: Maranda Lonni MATSU, MD Primary Care Physician: Joshua Debby CROME, MD Admit Date: 09/04/2024  Reason for Follow-up: Establishing goals of care, Non pain symptom management, and Pain control  Patient Profile/HPI:  69 year old male with past medical history significant of Crohn's disease status post ileostomy and takedown, recent admission with bleeding duodenal ulcer and malignant pleural effusion, status post biopsy of lung nodules, pathology positive for adenocarcinoma, possibly metastatic GI or lung cancer, was scheduled for PET scan tomorrow and then to follow-up with oncology for treatment recommendations. He is hard of hearing, presented to the emergency room on 09/04/2024 with complaints of severe right sided chest pain and increasing work of breathing. Workup reveals large right-sided pleural effusion. Palliative consulted for symptom management.     Discussion: Chart reviewed- 2mg  IV dilaudid , no oxycodone  used over last 24 hours.  On evaluation- patient sleeping.  Discussed with RN- last BM was 2/2.   Review of Systems  Unable to perform ROS: Other     Physical Exam Vitals and nursing note reviewed.  Constitutional:      Comments: Asleep   Cardiovascular:     Rate and Rhythm: Normal rate.  Pulmonary:     Effort: Pulmonary effort is normal.             Vital Signs: BP 110/61 (BP Location: Left Arm)   Pulse 97   Temp 97.6 F (36.4 C) (Oral)   Resp 15   Ht 5' 9 (1.753 m)   Wt 92.5 kg   SpO2 100%   BMI 30.13 kg/m  SpO2: SpO2: 100 % O2 Device: O2 Device: Room Air O2 Flow Rate: O2 Flow Rate (L/min): 4 L/min  Intake/output  summary:  Intake/Output Summary (Last 24 hours) at 09/10/2024 1312 Last data filed at 09/10/2024 1200 Gross per 24 hour  Intake 747.9 ml  Output 3740 ml  Net -2992.1 ml   LBM: Last BM Date : 09/08/24 Baseline Weight: Weight: 92.5 kg Most recent weight: Weight: 92.5 kg       Patient Active Problem List   Diagnosis Date Noted   Palliative care by specialist 09/07/2024   Hypoxemia 09/05/2024   Recurrent right  pleural effusion 09/04/2024   LAD (lymphadenopathy), mediastinal 08/14/2024   Pulmonary nodule 08/13/2024   Pleural effusion 08/13/2024   GIB (gastrointestinal bleeding) 08/08/2024   Pleural effusion on right 07/10/2024   LAFB (left anterior fascicular block) 05/13/2024   Right renal mass 08/24/2023   Microalbuminuria due to type 2 diabetes mellitus (HCC) 06/13/2023   Need for prophylactic vaccination and inoculation against varicella 06/12/2023   Tinea cruris 06/12/2023   BPH without obstruction/lower urinary tract symptoms 03/02/2021   Intrinsic eczema 93/79/7977   LSC (lichen simplex chronicus) 01/18/2021   Chronic gout due to renal impairment of multiple sites without tophus 06/01/2020   Elevated PSA 11/24/2019   Type II diabetes mellitus with manifestations (HCC) 08/17/2018   Atherosclerosis of aorta 09/26/2017   Drug-induced erectile dysfunction 07/19/2016   Pure hypertriglyceridemia 04/05/2016   Hyperlipidemia with target LDL less than 70 01/18/2016   OA (osteoarthritis) of knee 11/25/2013   PVD (peripheral vascular disease) 09/24/2013   Stage 3a chronic kidney disease (HCC) 05/26/2013   Antithrombin III  deficiency 05/16/2013   Anticoagulated on Coumadin  11/27/2012   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  Billing based on MDM: High  Problems Addressed: One or more chronic illnesses with severe exacerbation, progression, or side effects of treatment.    Risks: Parenteral controlled substances     Avabella Wailes, AGNP-C Palliative Medicine   Please contact Palliative Medicine Team phone at [phone] for questions and concerns.        "

## 2024-09-10 NOTE — Progress Notes (Signed)
" °  Progress Note   Date: 09/10/2024  Patient Name: Rick Edwards        MRN#: [id number]  Review the patients clinical findings supports the diagnosis of:   Hyponatremia, not present on admission     "

## 2024-09-10 NOTE — Progress Notes (Signed)
 "  NAME:  Rick Edwards, MRN:  [id number], DOB:  Jan 18, 1956, LOS: 6 ADMISSION DATE:  09/04/2024, CONSULTATION DATE:  09/04/24 REFERRING MD:  Zella Katha HERO, MD , CHIEF COMPLAINT:  Pleural Effusion   History of Present Illness:  Rick Edwards is a 69 y/o male with PMH significant for Antithrombin III  deficiency on coumadin , HTN, Crohns disease post ileostomy, and recent admission 08/07/24 - 08/23/2024 for upper GI bleed with malignant pleural effusion s/p biopsy w/ cytology revealing adenocarcinoma w/ concern for metastatic GI cancer and additional renal mass.   Patient is poor historian and brother was not present at bedside, majority HPI obtained from prior documentation. Patient was scheduled for PET scan 1/30 and oncology f/u for treatment planning. Patient presented today for right sided chest pain and dyspnea that has gradually worsened since his prior admission. CXR findings w/ a recurrent large right sided pleural effusion w/ complete opacification. Consult was placed for evaluation for pleural effusion management.   Prior moderate effusion on 1/3 was drained 100cc with vagal episode resulting in SBP in 70s. Cytology present for malignant cells, adenocarcinoma and molecular studies completed revealed NSCLC. Pertinent  Medical History    Past Medical History:  Diagnosis Date   Anemia    anemia thrombocytopenia, saw Hematology 2011, can not r/o myeloproliferative d/c   Antithrombin III  deficiency 05/16/2013   On coumadin  for this   Blood clot in vein    in right leg   Chronic kidney disease 05/27/2013   ACUTE RENAL FAILURE    Crohn's disease (HCC)    tx. Imuran    GI bleed 6/11   w/ normal EGD 6/11, 3 PRBCs    Hearing loss    wears hearing aid left ear; birthed with hearing loss   HOH (hard of hearing) 05/16/2013   Hypercalcemia    Hyperkalemia 05/27/2013   Hypertension    Ileostomy in place [facility]) 04-11-13   04-18-13 ileostomy to be taken down.   Perianal abscess 2001   s/p  right hemicolectomy, and drainage of retroperitoneal abcess 2003   Peripheral vascular disease    Transfusion history    last 8'13      Significant Hospital Events: Including procedures, antibiotic start and stop dates in addition to other pertinent events   1/29 admit for SOB; underwent 900 mL thoracentesis of right pleural effusion which yielded dark sanguinous fluid 1/30 emergent placement of 14 Fr pigtail catheter in R midaxillary line for acute hypoxemic respiratory failure / modereate respiratory distress.  Interim History / Subjective:  CXR improving  CT outpt last 24 hours 1,240 and already 740 on todays shift  Objective   Blood pressure 110/61, pulse 97, temperature 97.6 F (36.4 C), temperature source Oral, resp. rate 15, height 5' 9 (1.753 m), weight 92.5 kg, SpO2 100%.        Intake/Output Summary (Last 24 hours) at 09/10/2024 1046 Last data filed at 09/10/2024 1044 Gross per 24 hour  Intake 1203.9 ml  Output 3480 ml  Net -2276.1 ml   Filed Weights   09/04/24 0912  Weight: 92.5 kg    Examination: General: NAD HEENT: MM pink/moist CV: s1s2, RRR, no m/r/g PULM:  dim clear BS bilaterally; right sided CT in place GI: soft, bsx4 active  Extremities: warm/dry, no edema  Skin: no rashes or lesions    Resolved Hospital Problem list     Assessment & Plan:   Malignant Pleural Effusion Patient found to have malignant pleural effusion during admission 1/3 s/p lung nodule  biopsy with cytology findings of unknown primary adenocarcinoma. Patient also has additional concern for GI mets & renal mass. F/u was scheduled with Oncology and PET scan for 1/30. Prior thoracentesis for moderate pleural effusion completed on 1/3 was drained 100cc with vagal episode with SBP 70s. Molecular pathology revealed TPS 60% indicative of non-small cell lung cancer. S/p additional 900 mL sanguinous drainage via thoracentesis on 1/29 and now 14 Fr chest tube placement on 1/30 which has yielded  ongoing significant daily output. Plan: -given large CT outpt will hold on giving talc  for now and wait until CT outpt slows. -cont to flush CT per protocol -trend cxr  Labs   CBC: Recent Labs  Lab 09/07/24 0626 09/08/24 0144 09/08/24 1102 09/08/24 1206 09/08/24 1540 09/08/24 2131 09/09/24 0536 09/09/24 1456 09/10/24 0524  WBC 11.5* 16.9* 18.5*  --   --   --  15.5*  --  11.6*  HGB 10.1* 7.7* 10.0*   < > 7.7* 7.4* 7.3* 8.0* 8.6*  HCT 29.8* 23.2* 28.9*   < > 23.4* 22.6* 22.2* 23.7* 26.2*  MCV 91.4 90.3 88.4  --   --   --  91.0  --  92.6  PLT 454* 502* 429*  --   --   --  372  --  391   < > = values in this interval not displayed.    Basic Metabolic Panel: Recent Labs  Lab 09/05/24 0649 09/06/24 0538 09/08/24 1102 09/08/24 1206 09/09/24 0536 09/10/24 0524  NA 137 134* 132* 133* 135 137  K 5.0 4.7 5.4* 5.3* 4.4 4.0  CL 101 98 101 99 103 104  CO2 28 30 21*  --  25 26  GLUCOSE 142* 139* 102* 102* 102* 101*  BUN 22 21 44* 44* 29* 11  CREATININE 0.92 0.99 0.96 1.20 0.78 0.61  CALCIUM  9.8 8.9 8.1*  --  8.1* 8.2*   GFR: Estimated Creatinine Clearance: 102 mL/min (by C-G formula based on SCr of 0.61 mg/dL). Recent Labs  Lab 09/08/24 0144 09/08/24 1102 09/09/24 0536 09/10/24 0524  WBC 16.9* 18.5* 15.5* 11.6*    Liver Function Tests: Recent Labs  Lab 09/04/24 1026 09/04/24 1624  AST 19  --   ALT 20  --   ALKPHOS 108  --   BILITOT 0.8  --   PROT 7.4 6.9  ALBUMIN  3.9  --    Recent Labs  Lab 09/04/24 1026  LIPASE 27   No results for input(s): AMMONIA in the last 168 hours.  ABG    Component Value Date/Time   PHART 7.506 (H) 05/17/2013 0430   PCO2ART 36.9 05/17/2013 0430   PO2ART 78.4 (L) 05/17/2013 0430   HCO3 29.0 (H) 05/17/2013 0430   TCO2 24 09/08/2024 1206   O2SAT 95.9 05/17/2013 0430     Coagulation Profile: Recent Labs  Lab 09/05/24 0649 09/06/24 0538 09/08/24 1102 09/09/24 0536 09/10/24 0524  INR 4.2* 2.0* 1.1 1.2 1.3*     Cardiac Enzymes: No results for input(s): CKTOTAL, CKMB, CKMBINDEX, TROPONINI in the last 168 hours.  HbA1C: Hgb A1c MFr Bld  Date/Time Value Ref Range Status  05/13/2024 10:09 AM 6.2 4.6 - 6.5 % Final    Comment:    Glycemic Control Guidelines for People with Diabetes:Non Diabetic:  <6%Goal of Therapy: <7%Additional Action Suggested:  >8%   06/12/2023 10:17 AM 5.9 4.6 - 6.5 % Final    Comment:    Glycemic Control Guidelines for People with Diabetes:Non Diabetic:  <6%Goal of Therapy: <7%Additional  Action Suggested:  >8%     CBG: No results for input(s): GLUCAP in the last 168 hours.  Review of Systems:   As above   Past Medical History:  He,  has a past medical history of Anemia, Antithrombin III  deficiency (05/16/2013), Blood clot in vein, Chronic kidney disease (05/27/2013), Crohn's disease (HCC), GI bleed (6/11), Hearing loss, HOH (hard of hearing) (05/16/2013), Hypercalcemia, Hyperkalemia (05/27/2013), Hypertension, Ileostomy in place [facility]) (04-11-13), Perianal abscess (2001), Peripheral vascular disease, and Transfusion history.   Surgical History:   Past Surgical History:  Procedure Laterality Date   Anal fistulotomy  2002   BRONCHIAL NEEDLE ASPIRATION BIOPSY  08/14/2024   Procedure: BRONCHOSCOPY, WITH NEEDLE ASPIRATION BIOPSY;  Surgeon: Zaida Zola SAILOR, MD;  Location: [facility] ENDOSCOPY;  Service: Pulmonary;;   CRYOTHERAPY  08/14/2024   Procedure: CRYOTHERAPY;  Surgeon: Zaida Zola SAILOR, MD;  Location: [facility] ENDOSCOPY;  Service: Pulmonary;;   EMBOLECTOMY  03/12/2012   Procedure: EMBOLECTOMY;  Surgeon: Lonni GORMAN Blade, MD;  Location[facility] OR;  Service: Vascular;  Laterality: Right;  Right popliteal embolectomy with vein patch angioplasty, right posterior tibial embolectomy with vein patch angioplasty    ESOPHAGOGASTRODUODENOSCOPY N/A 12/12/2023   Procedure: EGD (ESOPHAGOGASTRODUODENOSCOPY);  Surgeon: Burnette Fallow, MD;  Location: [facility];  Service: Gastroenterology;   Laterality: N/A;   ESOPHAGOGASTRODUODENOSCOPY N/A 08/11/2024   Procedure: EGD (ESOPHAGOGASTRODUODENOSCOPY);  Surgeon: Dianna Specking, MD;  Location: [facility] ENDOSCOPY;  Service: Gastroenterology;  Laterality: N/A;   ESOPHAGOGASTRODUODENOSCOPY N/A 09/08/2024   Procedure: EGD (ESOPHAGOGASTRODUODENOSCOPY);  Surgeon: Rosalie Kitchens, MD;  Location: [facility];  Service: Gastroenterology;  Laterality: N/A;   HEMICOLECTOMY  08/29/2001   perforated abscess   HEMOSTASIS CLIP PLACEMENT  08/11/2024   Procedure: CONTROL OF HEMORRHAGE, GI TRACT, ENDOSCOPIC, BY HEMOSTATIC POWDER APPLICATION;  Surgeon: Dianna Specking, MD;  Location: [facility] ENDOSCOPY;  Service: Gastroenterology;;   ILEOSTOMY CLOSURE N/A 04/18/2013   Procedure: ILEOSTOMY TAKEDOWN;  Surgeon: Morene ONEIDA Olives, MD;  Location: [facility] ORS;  Service: General;  Laterality: N/A;   INTRAOPERATIVE ARTERIOGRAM  03/12/2012   Procedure: INTRA OPERATIVE ARTERIOGRAM;  Surgeon: Lonni GORMAN Blade, MD;  Location: [facility] OR;  Service: Vascular;  Laterality: Right;   PARTIAL COLECTOMY  03/11/2012   Procedure: PARTIAL COLECTOMY;  Surgeon: Morene ONEIDA Olives, MD;  Location: [facility] ORS;  Service: General;  Laterality: N/A;  subtotal colectomy transverse and left colon    TRANSMETATARSAL AMPUTATION  05/10/2012   right   UPPER ESOPHAGEAL ENDOSCOPIC ULTRASOUND (EUS) N/A 12/12/2023   Procedure: UPPER ESOPHAGEAL ENDOSCOPIC ULTRASOUND (EUS);  Surgeon: Burnette Fallow, MD;  Location: [facility];  Service: Gastroenterology;  Laterality: N/A;   VIDEO BRONCHOSCOPY WITH ENDOBRONCHIAL NAVIGATION Left 08/14/2024   Procedure: VIDEO BRONCHOSCOPY WITH ENDOBRONCHIAL NAVIGATION;  Surgeon: Zaida Zola SAILOR, MD;  Location: [facility] ENDOSCOPY;  Service: Pulmonary;  Laterality: Left;   VIDEO BRONCHOSCOPY WITH ENDOBRONCHIAL ULTRASOUND  08/14/2024   Procedure: BRONCHOSCOPY, WITH EBUS;  Surgeon: Zaida Zola SAILOR, MD;  Location: [facility] ENDOSCOPY;  Service: Pulmonary;;   VIDEO BRONCHOSCOPY WITH RADIAL ENDOBRONCHIAL ULTRASOUND   08/14/2024   Procedure: VIDEO BRONCHOSCOPY WITH RADIAL ENDOBRONCHIAL ULTRASOUND;  Surgeon: Zaida Zola SAILOR, MD;  Location: [facility] ENDOSCOPY;  Service: Pulmonary;;     Social History:   reports that he quit smoking about 13 years ago. His smoking use included cigarettes. He started smoking about 22 years ago. He has a 2.3 pack-year smoking history. He has never used smokeless tobacco. He reports that he does not currently use drugs after having used the following drugs: Marijuana. He reports that  he does not drink alcohol.   Family History:  His family history includes Heart disease in his father; Hyperlipidemia in his father and mother; Hypertension in his father, mother, and another family member; Stroke in an other family member. There is no history of Coronary artery disease, Diabetes, Colon cancer, Prostate cancer, or Kidney failure.   Allergies Allergies[1]   Home Medications  Prior to Admission medications  Medication Sig Start Date End Date Taking? Authorizing Provider  albuterol  (VENTOLIN  HFA) 108 (90 Base) MCG/ACT inhaler Inhale 1-2 puffs into the lungs every 6 (six) hours as needed for wheezing or shortness of breath. 07/23/24   Reddick, Johnathan B, NP  allopurinol  (ZYLOPRIM ) 300 MG tablet TAKE 1 TABLET BY MOUTH EVERY DAY 07/21/24   Joshua Debby CROME, MD  amLODipine  (NORVASC ) 10 MG tablet TAKE 1 TABLET BY MOUTH EVERYDAY AT BEDTIME 07/21/24   Joshua Debby CROME, MD  atorvastatin  (LIPITOR) 40 MG tablet TAKE 1 TABLET BY MOUTH EVERY DAY 07/11/24   Joshua Debby CROME, MD  carvedilol  (COREG ) 25 MG tablet TAKE 1 TABLET (25 MG TOTAL) BY MOUTH TWICE A DAY WITH MEALS 08/11/24   Joshua Debby CROME, MD  doxazosin  (CARDURA ) 4 MG tablet Take 1 tablet (4 mg total) by mouth at bedtime. 05/26/24   Joshua Debby CROME, MD  omega-3 acid ethyl esters (LOVAZA ) 1 g capsule Take 2 g by mouth 2 (two) times daily. Patient not taking: Reported on 08/25/2024    [provider]  oxyCODONE  (ROXICODONE ) 5 MG immediate release  tablet Take 1 tablet (5 mg total) by mouth every 8 (eight) hours as needed for severe pain (pain score 7-10). 08/28/24 08/28/25  Joshua Debby CROME, MD  spironolactone  (ALDACTONE ) 25 MG tablet Take 1 tablet (25 mg total) by mouth daily. 10/26/23   Joshua Debby CROME, MD  warfarin (COUMADIN ) 5 MG tablet TAKE 1 TABLET BY MOUTH DAILY ON MONDAY, WEDNESDAY, AND FRIDAY. TAKE 1 and 1/2 TABLETS ON ALL OTHER DAYS OR AS DIRECTED BY CLINIC. 08/23/24   Dino Antu, MD           JD Emilio RIGGERS [facility] 09/10/2024, 10:56 AM  Please see [email or web address] for pager details.  From 7A-7P if no response, please call [phone]. After hours, please call ELink [phone].         [1]  Allergies Allergen Reactions   Olmesartan      Hx of severe AKI requiring HD on ACE per renal note, hx of AKI and hypotension with olmesartan  07/2023   Mesalamine Rash    REACTION: Rash   "

## 2024-09-10 NOTE — Progress Notes (Signed)
 Occupational Therapy Treatment Patient Details Name: CALLAGHAN LAVERDURE MRN: [id number] DOB: [birth year] Today's Date: 09/10/2024   History of present illness 69 yr old male admitted with right sided chest pain and dyspnea , was scheduled for PET scan on 1/30 but came to ED on 1/29 instead. Finding during this admission with recurrent right sided pleural effusion with complete opacification. 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    OT comments  The pt was received in bed.  He gently declined to attempt supine to sit and progressive out of bed activity this date, due to fatigue, back pain and malaise. He was noted to be ill positioned in bed, with him then needing mod assist to scoot up in the bed into a more optimal position. OT further instructed him on bed level BUE and BLE ROM and therapeutic exercises to decrease the risk for general joint stiffness and further muscle weakness. The pt was noted to state a few times that he wanted to eat and would like assistance in this regard. OT provided set-up assist with the pt positioned upright in the bed. Once the finger food was placed in his hand and OT removed the wrapper, he was able to use his RUE to perform self-feeding. Continue OT plan of care. Patient will benefit from continued inpatient follow up therapy, <3 hours/day.         If plan is discharge home, recommend the following:  A little help with bathing/dressing/bathroom;Assistance with cooking/housework;Help with stairs or ramp for entrance   Equipment Recommendations  Tub/shower bench    Recommendations for Other Services      Precautions / Restrictions Precautions Precautions: Fall Precaution/Restrictions Comments: chest tube Restrictions Weight  Bearing Restrictions Per Provider Order: No       Mobility Bed Mobility Overal bed mobility: Needs Assistance      General bed mobility comments: The pt gently declined to attempt supine to sit this date, due to fatigue, back pain and malaise. He was noted to be ill positioned in bed, with him then needing mod assist to scoot up in the bed into a more optimal position.             ADL either performed or assessed with clinical judgement   ADL Overall ADL's : Needs assistance/impaired Eating/Feeding: Set up;Bed level Eating/Feeding Details (indicate cue type and reason): Upon this OT entering his room, the pt was noted to state a few times that he wanted to eat and would like assistance in this regard. OT provided set-up assist with the pt positioned upright in the bed. Once the finger food was placed in his hand and OT removed the wrapper, he was able to use his RUE to perform self-feeding. Grooming: Minimal assistance;Bed level Grooming Details (indicate cue type and reason): Pt required assist to wring out a washcloth, then he used his RUE to perform face washing while upright at bed level.            Communication Communication Factors Affecting Communication: Hearing impaired (wears a hearing aide)   Cognition Arousal: Alert Behavior During Therapy: WFL for tasks assessed/performed            Following commands: Intact        Cueing   Cueing Techniques: Verbal cues  Pertinent Vitals/ Pain       Pain Assessment Pain Assessment: Faces Pain Score: 6  Pain Location: back Pain Descriptors / Indicators: Aching, Grimacing, Sore Pain Intervention(s): Limited activity within patient's tolerance, Monitored during session, Repositioned   Frequency  Min 2X/week        Progress Toward Goals  OT Goals(current goals can now be found in the care plan section)  Progress towards OT goals: Progressing toward goals  Acute Rehab OT Goals Patient  Stated Goal: to get better OT Goal Formulation: With patient Time For Goal Achievement: 09/20/24 Potential to Achieve Goals: Good  Plan         AM-PAC OT 6 Clicks Daily Activity     Outcome Measure   Help from another person eating meals?: A Little Help from another person taking care of personal grooming?: A Little Help from another person toileting, which includes using toliet, bedpan, or urinal?: A Lot Help from another person bathing (including washing, rinsing, drying)?: A Lot Help from another person to put on and taking off regular upper body clothing?: A Lot Help from another person to put on and taking off regular lower body clothing?: A Lot 6 Click Score: 14    End of Session Equipment Utilized During Treatment: Other (comment) (N/A)  OT Visit Diagnosis: Muscle weakness (generalized) (M62.81);Pain;Other abnormalities of gait and mobility (R26.89) Pain - part of body:  (back)   Activity Tolerance Patient limited by fatigue;Patient limited by pain   Patient Left in bed;with call bell/phone within reach;with bed alarm set   Nurse Communication Mobility status        Time: 1650-1711 OT Time Calculation (min): 21 min  Charges: OT General Charges $OT Visit: 1 Visit OT Treatments $Self Care/Home Management : 8-22 mins    Delanna JINNY Lesches, OTR/L 09/10/2024, 5:45 PM

## 2024-09-10 NOTE — Progress Notes (Signed)
 Rick Edwards 10:11 AM  Subjective: Patient without any new complaints and no signs of bleeding did not have a bowel movement today and case discussed with his brother  Objective: Vital signs stable afebrile no acute distress abdomen is soft nontender BUN back to normal hemoglobin stable  Assessment: Duodenal bulb ulcer with bleed in patient with multiple medical problems  Plan: Will allow soft solids today and will need reevaluation on blood thinner needs whether it is retry Coumadin  or use other oral medications or subcu Lovenox  or heparin  but I would use the lowest possible dose that will make oncology happy and I will check on tomorrow [facility]  office [phone] After 5PM or if no answer call [phone]

## 2024-09-10 NOTE — Progress Notes (Signed)
" °   09/10/24 1607  Assess: MEWS Score  BP 110/77  MAP (mmHg) 89  Pulse Rate (!) 167  ECG Heart Rate (!) 155  Resp (!) 28  Level of Consciousness Alert  SpO2 96 %  O2 Device Room Air  Assess: MEWS Score  MEWS Temp 0  MEWS Systolic 0  MEWS Pulse 3  MEWS RR 2  MEWS LOC 0  MEWS Score 5  MEWS Score Color Red  Assess: if the MEWS score is Yellow or Red  Does the patient meet 2 or more of the SIRS criteria? Yes  MEWS guidelines implemented  Yes, red  Treat  MEWS Interventions Considered administering scheduled or prn medications/treatments as ordered  Take Vital Signs  Increase Vital Sign Frequency  Red: Q1hr x2, continue Q4hrs until patient remains green for 12hrs  Escalate  MEWS: Escalate Red: Discuss with charge nurse and notify provider. Consider notifying RRT. If remains red for 2 hours consider need for higher level of care  Notify: Charge Nurse/RN  Name of Charge Nurse/RN Notified Glass Blower/designer  Notify: Rapid Response  Name of Rapid Response RN Notified Omega SQUIBB RN  Date Rapid Response Notified 09/10/24  Time Rapid Response Notified 1612  Assess: SIRS CRITERIA  SIRS Temperature  0  SIRS Respirations  1  SIRS Pulse 1  SIRS WBC 0  SIRS Score Sum  2    "

## 2024-09-10 NOTE — Progress Notes (Signed)
 Speech Language Pathology    Patient Details Name: Rick Edwards MRN: [id number] DOB: 1956-02-09 Today's Date: 09/10/2024 Time:  -     Speech-language-cognitive eval ordered. Pt admitted with chest pain. No acute neurological changes this SLP could locate. MD in agreement with assessment not needed.                             Dustin Olam Bull  09/10/2024, 4:20 PM

## 2024-09-10 NOTE — Progress Notes (Signed)
 CT reviewed which shows possible hemorrhagic met in the posterior fossa. After thorough review of the patient chart and medical history, he does not particularly sound like a good surgical candidate. He is currently on coumadin  which also makes the risk higher. A frank discussion with the family and patient about prognosis and goals of care needs to be had. If we they are looking at aggressive treatment then he needs to be transferred to [facility] for closer monitoring.

## 2024-09-11 ENCOUNTER — Inpatient Hospital Stay (HOSPITAL_COMMUNITY)

## 2024-09-11 LAB — PROTIME-INR
INR: 1.3 — ABNORMAL HIGH (ref 0.8–1.2)
Prothrombin Time: 16.7 s — ABNORMAL HIGH (ref 11.4–15.2)

## 2024-09-11 LAB — LEGIONELLA PNEUMOPHILA SEROGP 1 UR AG: L. pneumophila Serogp 1 Ur Ag: NEGATIVE

## 2024-09-11 MED ORDER — METOPROLOL TARTRATE 25 MG PO TABS
25.0000 mg | ORAL_TABLET | Freq: Two times a day (BID) | ORAL | Status: AC
  Administered 2024-09-11 – 2024-09-12 (×4): 25 mg via ORAL
  Filled 2024-09-11 (×4): qty 1

## 2024-09-11 MED ORDER — SODIUM CHLORIDE 0.9% FLUSH
10.0000 mL | Freq: Four times a day (QID) | INTRAVENOUS | Status: AC
  Administered 2024-09-11 – 2024-09-12 (×7): 10 mL via INTRAPLEURAL

## 2024-09-11 NOTE — Plan of Care (Signed)
 Palliative-   Chart reviewed- noted patient had BM today.  New finding of hemorrhagic mass on CT head.  Palliative will follow up with patient and family tomorrow.   Cassondra Stain, AGNP-C Palliative Medicine  No charge

## 2024-09-11 NOTE — Progress Notes (Signed)
 " PROGRESS NOTE    Rick Edwards  FMW:[id number] DOB: 1956-01-15 DOA: 09/04/2024 PCP: Joshua Debby CROME, MD    Brief Narrative:   Rick Edwards is a 69 y.o. 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  Recently discharged following thoracentesis.  Since arriving back home his right-sided chest pain and shortness of breath has gradually worsened.  Worse with exertion.  Associated with mild nausea but no vomiting, no fevers.  Additionally with generalized weakness and difficulty performing his ADLs.  No further evidence of GI bleed, dark tarry stools.  Has been compliant with his home medication including Coumadin .  In the ED, temperature 97.3 F, HR 110, RR 25, BP 117/77, O2 to 100% on room air.  WBC 10.5, Huma 12.2, platelet count 513.  Sodium 138, potassium 4.5, chloride 100, CO2 26, glucose 146, BUN 18, creat 1.06.  AST 19, ALT 20, total bilirubin 0.8.  INR 5.7.  Urinalysis unrevealing.  Chest x-ray with large right pleural effusion with complete opacification of the right hemithorax, no acute radiographic abnormality in the left lung.  TRH consulted for admission for recurrent large right malignant pleural effusion.  Assessment & Plan:  Brain metastases with hemorrhage -Patient has been complaining for dizziness off and on for some time and then headache yesterday. Head CT with and without contrast was performed showing cerebellar vermis hemorrhage, most likely underlying metastasis.  Started on Decadron  09/10/2024.  Much brighter this morning.  MRI brain with and without contrast ordered to better define.  Need to discuss his prognosis with  oncology.  I called his sister's number, listed in the chart, and left a voicemail but she never got back to me last night.  My understanding is he has an actionable mutation so is a candidate for oral immunotherapy.  Atrial fibrillation with RVR -Started on amiodarone  yesterday due to low blood pressures.-He has converted to normal sinus rhythm early this morning about 5 AM ( 09/11/24).  Not sure he is a great amiodarone  candidate long-term so we will start Lopressor  25 mg p.o. twice daily follow-up.    Large right malignant pleural effusion, recurrent s/p chest tube Right lower lobe pneumonia Patient presents with right-sided chest pain, shortness of breath, generalized weakness.  Recently diagnosed with adenocarcinoma of unknown clear primary following most recent hospitalization with thoracentesis and bronchoscopy.  Imaging previously noted left upper lobe nodule, right renal lesion concerning for RCC versus GI source.  Patient is afebrile.  Chest x-ray with large right pleural effusion with complete opacification of the right hemithorax.  PCCM was consulted and underwent thoracentesis on 09/04/2024 with removal of 900 mL of turbid fluid.  CT angiogram GI bleed study 2/2 with findings of dense consolidation right lower lobe with complex fluid in the right pleural space. -- PCCM following, appreciate assistance -- Chest tube placement pulmonology 1/30 -- Chest tube output: 410 mL past 24h -- Azithromycin  500 mg p.o. daily x 5 days -- Ceftriaxone  2 g IV every 24 hours x 5 days -- Continue supplemental oxygen, maintain SpO2 greater than 92% -- Patient is considering his options which include talc  pleurodesis or PleurX catheter.  He wants to talk it over with his sister.  Upper GI bleed 2/2 duodenal ulcer, recurrent Recent EGD 08/11/2024 with findings of oozing cratered duodenal ulcer s/p hemostatic spray with resolution of bleeding.  Patient with recurrence of bloody bowel movements 2/2, CT angiogram  GI bleed study with findings of extravasation duodenal bulb on arterial phase imaging, prominent mural vascularity low rectum on arterial phase concerning for angiodysplasia.  Patient underwent EGD on 09/08/2024 with findings of LA grade a esophagitis, 1 nonbleeding cratered duodenal ulcer with adherent clot duodenal bulb, gastric lipoma.  -- [facility] following, appreciate assistance -- Hgb 12.2>11.5>11.7>10.6>7.7>7.4>7.3 -- s/p 2u pRBC 2/2 -- repeat tranfusion 1u pRBC today; repeat H&H 2 hours following transfusion -- Clear liquid diet x 2 days -- CBC daily -- If further significant bleeding, GI recommends consideration of IR consult for embolization duodenal bulb  Adenocarcinoma, unclear primary Concern for lung versus GI versus renal etiology.  Follows with medical oncology, Dr. Federico. He had biopsy done via bronchoscopy with pulmonology at [facility] earlier this month. He was scheduled for outpatient PET scan 09/05/2024; but due to recurrent hospitalization will need to reschedule.  Antithrombin III  deficiency Supratherapeutic INR Chronically on Coumadin .  INR elevated 5.7 on admission.  Received 2.5 mg p.o. vitamin K  and 5mg  on 1/30 -- INR 5.7>4.2>2.0>1.2 -- Holding warfarin for now -- INR daily  HTN At baseline on amlodipine  10 mg p.o. daily, Carvedilol  25 mg p.o. twice daily, Spironolactone  25 mg p.o. daily -- Hold antihypertensives given acute blood loss anemia secondary to recurrent GI bleed, hypotension  HLD Peripheral vascular disease -- Atorvastatin  40 mg p.o. daily  CKD stage IIIa -- Cr 1.06>0.92>0.99>0.78, stable  History of Crohn's disease Follows with [facility] gastroenterology outpatient; Dr. Elicia.  Not on immunosuppressants outpatient  DM2 Diet controlled at baseline.  Last hemoglobin A1c 6.2%.   DVT prophylaxis: Place and maintain sequential compression device Start: 09/08/24 9361 SCDs Start: 09/04/24 1242    Code Status: Full Code Family Communication:  I  spoke to his sister this am.  Disposition Plan:  Level of care: Progressive Status is: Inpatient Remains inpatient appropriate because: Chest tube remains in place; needs further advancement of diet, SNF placement    Consultants:  PCCM Palliative Care [facility]  Procedures:  Right thoracentesis, PCCM 1/29 Chest tube, PCCM 1/30  Antimicrobials:  Azithromycin  2/2>> Ceftriaxone  2/2>>   Subjective: Patient looks and feels better today.  No complaints of headache.  Dizziness is getting a bit better.  He looks much brighter and more awake.  He is oriented x 3 moving all of his extremities to command.  No nystagmus and his extraocular movements are intact.  Objective: Vitals:   09/10/24 2200 09/11/24 0000 09/11/24 0200 09/11/24 0400  BP: 111/69 112/68 121/68 114/61  Pulse: 92 (!) 113 (!) 108 71  Resp: (!) 23 (!) 23 17 16   Temp:      TempSrc:      SpO2: 97% 96% 98% 93%  Weight:      Height:        Intake/Output Summary (Last 24 hours) at 09/11/2024 1011 Last data filed at 09/11/2024 0100 Gross per 24 hour  Intake 1369.91 ml  Output 1630 ml  Net -260.09 ml   Filed Weights   09/04/24 0912  Weight: 92.5 kg    Examination:  Physical Exam: GEN: Looks brighter today, alert and oriented x 3, chronically ill appearance, appears older than stated age, cachectic HEENT: NCAT, PERRL, EOMI, sclera clear, MMM PULM: Diminished breath sounds both bases, CTA left chest without wheezing/crackles,  normal respiratory effort without accessory muscle use, on room air with SpO2 100% at rest.   CV: RRR w/o M/G/R GI: abd soft, NTND, + BS MSK: no peripheral edema, moves all extremities independently, generally muscle wasted throughout NEURO: No focal neurological deficit PSYCH: normal mood/affect Integumentary: No concerning rashes/lesions/wounds noted on exposed skin surfaces    Data Reviewed: I have personally reviewed following labs and imaging studies  CBC: Recent Labs   Lab 09/07/24 0626 09/08/24 0144 09/08/24 1102 09/08/24 1206 09/08/24 1540 09/08/24 2131 09/09/24 0536 09/09/24 1456 09/10/24 0524  WBC 11.5* 16.9* 18.5*  --   --   --  15.5*  --  11.6*  HGB 10.1* 7.7* 10.0*   < > 7.7* 7.4* 7.3* 8.0* 8.6*  HCT 29.8* 23.2* 28.9*   < > 23.4* 22.6* 22.2* 23.7* 26.2*  MCV 91.4 90.3 88.4  --   --   --  91.0  --  92.6  PLT 454* 502* 429*  --   --   --  372  --  391   < > = values in this interval not displayed.   Basic Metabolic Panel: Recent Labs  Lab 09/05/24 0649 09/06/24 0538 09/08/24 1102 09/08/24 1206 09/09/24 0536 09/10/24 0524  NA 137 134* 132* 133* 135 137  K 5.0 4.7 5.4* 5.3* 4.4 4.0  CL 101 98 101 99 103 104  CO2 28 30 21*  --  25 26  GLUCOSE 142* 139* 102* 102* 102* 101*  BUN 22 21 44* 44* 29* 11  CREATININE 0.92 0.99 0.96 1.20 0.78 0.61  CALCIUM  9.8 8.9 8.1*  --  8.1* 8.2*  MG  --   --   --   --   --  1.7   GFR: Estimated Creatinine Clearance: 102 mL/min (by C-G formula based on SCr of 0.61 mg/dL). Liver Function Tests: Recent Labs  Lab 09/04/24 1026 09/04/24 1624  AST 19  --   ALT 20  --   ALKPHOS 108  --   BILITOT 0.8  --   PROT 7.4 6.9  ALBUMIN  3.9  --    Recent Labs  Lab 09/04/24 1026  LIPASE 27   No results for input(s): AMMONIA in the last 168 hours. Coagulation Profile: Recent Labs  Lab 09/06/24 0538 09/08/24 1102 09/09/24 0536 09/10/24 0524 09/11/24 0528  INR 2.0* 1.1 1.2 1.3* 1.3*   Cardiac Enzymes: No results for input(s): CKTOTAL, CKMB, CKMBINDEX, TROPONINI in the last 168 hours. BNP (last 3 results) Recent Labs    08/08/24 1227  PROBNP <50.0   HbA1C: No results for input(s): HGBA1C in the last 72 hours. CBG: No results for input(s): GLUCAP in the last 168 hours. Lipid Profile: No results for input(s): CHOL, HDL, LDLCALC, TRIG, CHOLHDL, LDLDIRECT in the last 72 hours. Thyroid  Function Tests: No results for input(s): TSH, T4TOTAL, FREET4, T3FREE,  THYROIDAB in the last 72 hours. Anemia Panel: No results for input(s): VITAMINB12, FOLATE, FERRITIN, TIBC, IRON, RETICCTPCT in the last 72 hours. Sepsis Labs: No results for input(s): PROCALCITON, LATICACIDVEN in the last 168 hours.  Recent Results (from the past 240 hours)  Body fluid culture w Gram Stain     Status: None   Collection Time: 09/04/24  3:49 PM   Specimen: Pleural Fluid  Result Value Ref Range Status   Specimen Description   Final    PLEURAL Performed at [facility], [address], [place], [place] [address]    Special Requests   Final  NONE Performed at [facility], [address], [place], [place] [address]    Gram Stain NO WBC SEEN NO ORGANISMS SEEN   Final   Culture   Final    NO GROWTH 3 DAYS Performed at [facility], [address], [place], [place] [address]    Report Status 09/08/2024 FINAL  Final         Radiology Studies: DG CHEST PORT 1 VIEW Result Date: 09/11/2024 CLINICAL DATA:  Pleural effusion. EXAM: PORTABLE CHEST 1 VIEW COMPARISON:  09/10/2024 FINDINGS: Right pleural drain again noted with right base collapse/consolidation and probable effusion. Tiny right apical pneumothorax evident. Left lung clear. The cardio pericardial silhouette is enlarged. Telemetry leads overlie the chest. IMPRESSION: 1. Tiny right apical pneumothorax. 2. Persistent right base collapse/consolidation and probable effusion. Electronically Signed   By: Camellia Candle M.D.   On: 09/11/2024 07:24   CT HEAD W & WO CONTRAST ( ) Result Date: 09/10/2024 EXAM: CT HEAD WITHOUT AND WITH CONTRAST 09/10/2024 05:59:37 PM TECHNIQUE: CT of the head was performed without and with the administration of 75 mL of iohexol  (OMNIPAQUE ) 300 MG/ML solution. Automated exposure control, iterative reconstruction, and/or weight based adjustment of the mA/kV was utilized to reduce the radiation dose to as low as reasonably achievable.  COMPARISON: None available. CLINICAL HISTORY: Brain metastases suspected. FINDINGS: BRAIN AND VENTRICLES: There is a 3.9 x 2.9 x 3.6 cm hemorrhagic focus in the posterior fossa centered within the cerebellar vermis. There is surrounding edema and significant associated mass effect on the fourth ventricle. There is no evidence of significant dilation of the lateral ventricles and third ventricle. Findings are concerning for hemorrhagic mass within the posterior fossa likely in the setting of metastatic disease. Recommend contrast enhanced MRI brain for further evaluation. There are no additional areas of intracranial hemorrhage noted. There are no CT findings to suggest enhancing mass within the supratentorial parenchyma. Evaluation for enhancing mass in the posterior fossa is limited due to the degree of hemorrhage. No significant midline shift. Atherosclerosis of the carotid siphons. No extra-axial fluid collection. No evidence of acute infarct. ORBITS: No acute abnormality. SINUSES AND MASTOIDS: No acute abnormality. SOFT TISSUES AND SKULL: No focal bone lesion. No acute soft tissue abnormality. IMPRESSION: 1. 3.9 cm focus of hemorrhage in the posterior fossa centered within the cerebellar vermis with surrounding edema and significant mass effect on the fourth ventricle. Findings concerning for hemorrhagic mass, in particular metastatic disease. Recommend contrast-enhanced MRI brain for further evaluation. Electronically signed by: Donnice Mania MD 09/10/2024 06:26 PM EST RP Workstation: [id number]   DG CHEST PORT 1 VIEW Result Date: 09/10/2024 EXAM: 1 VIEW XRAY OF THE CHEST 09/10/2024 04:58:00 AM COMPARISON: Chest yesterday at 09/09/2024 07:07:00 AM. CLINICAL HISTORY: 142230 Pleural effusion 142230 Pleural effusion. FINDINGS: LINES, TUBES AND DEVICES: A pigtail chest tube in the lower right thorax is unchanged in position. Overlying telemetry leads. LUNGS AND PLEURA: Right basilar airspace disease is also  improved but not resolved. Left lung is clear. There is chronic vascular prominence of the left hilum. A small right pleural effusion appears improved. No pneumothorax. HEART AND MEDIASTINUM: Mild cardiomegaly with no evidence of CHF. Stable mediastinal configuration. BONES AND SOFT TISSUES: No acute osseous abnormality. IMPRESSION: 1. Small right pleural effusion, improved. 2. Pigtail chest tube in the lower right thorax, unchanged in position. 3. Improved right basilar airspace disease, not resolved. Electronically signed by: Francis Quam MD 09/10/2024 07:36 AM EST RP Workstation: [id number]  Scheduled Meds:  acetaminophen   1,000 mg Oral TID   azithromycin   500 mg Oral Daily   dexamethasone  (DECADRON ) injection  4 mg Intravenous Q6H   feeding supplement  1 Container Oral TID BM   lidocaine   20 mL Intrapleural Once   metoprolol  tartrate  25 mg Oral BID   pantoprazole  (PROTONIX ) IV  40 mg Intravenous Q12H   senna  1 tablet Oral QHS   sodium chloride  flush  10 mL Intrapleural Q6H   Continuous Infusions:  cefTRIAXone  (ROCEPHIN )  IV 2 g (09/11/24 0948)     LOS: 7 days    Time spent: 50 minutes    Lonni KANDICE Moose, DO [facility] Available via Epic secure chat 7am-7pm After these hours, please refer to coverage provider listed on [email or web address] 09/11/2024, 10:11 AM   "

## 2024-09-11 NOTE — Progress Notes (Signed)
 Physical Therapy Treatment Patient Details Name: Rick Edwards MRN: [id number] DOB: 03-21-1956 Today's Date: 09/11/2024   History of Present Illness 69 yo admit with right sided chest pain and dyspnea , was scheduled for PET scan on 1/30 but came to ED on 1/29 instead. Finding during this admission with recurrent Right sided pleural effusion with complete opacification. 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 Brother is very supportive and present. Prior to previous admission pt was independent    PT Comments  Pt supine in bed and noted to have had a large incontinent BM. RN and NT present to assist with pericare and linen change. Once completed, pt able to sit to EOB at [facility] A with NT monitoring lines/leads. Stand step transfer to recliner at [facility] A +2. Pt visibly limited by pain in low back with sitting unsupported and while standing, pointing across his low back for location of pain. Emotional support and education regarding possible causes of pain and pain management strategies including mobility, LE exercises and time out of bed. Pt will continue to benefit from skilled therapy while admitted to acute hospital to address decreased functional strength and endurance, impaired mobility, decreased balance, limited activity tolerance, pain, and decreased functional independence. PT continues to recommend SNF at discharge.     If plan is discharge home, recommend the following: Assistance with cooking/housework;Assist for transportation;Help with stairs or ramp for entrance;A lot of help with bathing/dressing/bathroom;A lot of help with walking and/or transfers   Can travel by private vehicle     No  Equipment Recommendations  None recommended by PT (defer to next  level of care; if home, will make recommendations in next session)    Recommendations for Other Services       Precautions / Restrictions Precautions Precautions: Fall Recall of Precautions/Restrictions: Intact Precaution/Restrictions Comments: chest tube Restrictions Weight Bearing Restrictions Per Provider Order: No     Mobility  Bed Mobility Overal bed mobility: Needs Assistance Bed Mobility: Supine to Sit, Rolling Rolling: Mod assist, +2 for safety/equipment   Supine to sit: Min assist, +2 for safety/equipment, +2 for physical assistance     General bed mobility comments: MOD A for rolling R/L x2 each to allow for pericare and linen change after large incontinent BM. Pt demos improvements with sit>supine at MIN A with use of bedrail and increased time to sit secondary to pain, assistance to scoot hips completely to EOB. RN and NT present during mobility tasks to manage lines and assist with mobility secondary to pain    Transfers Overall transfer level: Needs assistance Equipment used: 1 person hand held assist Transfers: Sit to/from Stand, Bed to chair/wheelchair/BSC Sit to Stand: Min assist, +2 physical assistance, From elevated surface, +2 safety/equipment   Step pivot transfers: Min assist, +2 physical assistance, +2 safety/equipment       General transfer comment: MIN A secondary to pain in low back, second person assistance with line management of chest tube, foley and cardiac monitor. pt able to support self on PT's arms while standing and taking steps to recliner with VC to reach for arm rests to assist with controlled lowering to seat. Pillows placed behind back, feet elevated and back reclined for improve positioning    Ambulation/Gait  Stairs             Wheelchair Mobility     Tilt Bed    Modified Rankin (Stroke Patients Only)       Balance Overall balance assessment: Needs assistance Sitting-balance support: No  upper extremity supported, Feet supported Sitting balance-Leahy Scale: Fair Sitting balance - Comments: increased rocking and shaking once seated at EOB due to pain, however did not appear that patient would lose his balance   Standing balance support: Bilateral upper extremity supported, During functional activity, Reliant on assistive device for balance Standing balance-Leahy Scale: Poor Standing balance comment: poor standing balance secondary to pain with impaired safety awareness                            Communication Communication Communication: Impaired Factors Affecting Communication: Hearing impaired  Cognition Arousal: Alert Behavior During Therapy: WFL for tasks assessed/performed   PT - Cognitive impairments: No apparent impairments Difficult to assess due to: Hard of hearing/deaf                     PT - Cognition Comments: Pt very HOH wears hearing aids Following commands: Intact      Cueing Cueing Techniques: Verbal cues  Exercises      General Comments        Pertinent Vitals/Pain Pain Assessment Pain Assessment: Faces Faces Pain Scale: Hurts whole lot Pain Location: low back once seated at EOB Pain Descriptors / Indicators: Aching, Grimacing, Sore Pain Intervention(s): Limited activity within patient's tolerance, Monitored during session, Repositioned, Premedicated before session    Home Living                          Prior Function            PT Goals (current goals can now be found in the care plan section) Acute Rehab PT Goals Patient Stated Goal: Return Home PT Goal Formulation: With patient/family Time For Goal Achievement: 09/20/24 Potential to Achieve Goals: Fair Progress towards PT goals: Progressing toward goals    Frequency    Min 2X/week      PT Plan      Co-evaluation              AM-PAC PT 6 Clicks Mobility   Outcome Measure  Help needed turning from your back to your side while  in a flat bed without using bedrails?: A Little Help needed moving from lying on your back to sitting on the side of a flat bed without using bedrails?: A Little Help needed moving to and from a bed to a chair (including a wheelchair)?: A Lot Help needed standing up from a chair using your arms (e.g., wheelchair or bedside chair)?: A Lot Help needed to walk in hospital room?: A Lot Help needed climbing 3-5 steps with a railing? : Total 6 Click Score: 13    End of Session Equipment Utilized During Treatment: Gait belt Activity Tolerance: Patient tolerated treatment well;Patient limited by pain Patient left: in chair;with call bell/phone within reach;with chair alarm set (NT and RN in room at end of session) Nurse Communication: Mobility status PT Visit Diagnosis: Difficulty in walking, not elsewhere classified (R26.2);Muscle weakness (generalized) (M62.81)     Time: 8659-8641 PT Time Calculation (min) (ACUTE ONLY): 18 min  Charges:    $Therapeutic Activity: 8-22 mins  Isaiah DEL. Shelsy Seng, PT, DPT   [employer] 09/11/2024, 2:14 PM

## 2024-09-11 NOTE — Progress Notes (Signed)
 Rick Edwards 12:07 PM  Subjective: Patient is stable no signs of bleeding no bowel movement tolerated soft solid breakfast no abdominal pain  Objective: Vital signs stable afebrile no acute distress abdomen is soft nontender BUN and creatinine back to normal hemoglobin stable head CT noted  Assessment: Multiple medical problems including duodenal ulcer  Plan: Make sure he is on pump inhibitors the rest of his life have oncology reevaluate blood thinner needs and please call us  if we can be of any further assistance with this hospital stay  [facility]  office [phone] After 5PM or if no answer call [phone]

## 2024-09-11 NOTE — Progress Notes (Signed)
 Around 5am RN noted within  the past hour patient had converted from afib to normal sinus rhythm. EKG was obtained to confirm, placed on chart. NP notified. Pt remains on amio drip per order. BP remains within normal limits.

## 2024-09-11 NOTE — Progress Notes (Cosign Needed)
 Rick Edwards   DOB:07-04-1956   FM#:[id number]      CLINICAL SUMMARY:  Rick Edwards is a 69 year old male patient who was readmitted on 09/04/2024 with complaints of right-sided chest pain.  Patient previously admitted and discharged on 08/23/2024 for duodenal ulceration and upper GI bleed.  Diagnosed with adenocarcinoma unknown primary.  Medical oncology following.   ASSESSMENT & PLAN:  Lung adenocarcinoma Brain mets with hemorrhage-new - Recently diagnosed January 2026 with adenocarcinoma of pleural fluid. - Patient with LUL lesion that is concerning for malignancy.  During previous admission, he was seen by GI who stated difficulty in obtaining biopsy of duodenal ulcer. - CEA tumor marker on 08/14/2024 elevated 119.0 - [facility] molecular testing. - Patient was scheduled to have a PET 09/05/2024 however due to hospitalization, could not be performed.  Will need to reschedule upon discharge. - CT head 09/10/2024 shows concern for hemorrhagic mass, in particular metastatic disease, recommends MRI brain. - Seen by neurosurgical team. - Medical oncology/Dr. Federico will make further recommendations.  Recurrent right pleural effusion Pneumonia - In setting of malignancy - Right-sided chest tube intact and draining serosanguineous fluid - Continue IV antibiotics and supplemental O2 as ordered - Monitor respiratory status closely  History of upper GI bleed and duodenal ulcer Anemia - Hemoglobin shows downtrend, 8.6 on 2/4 status post PRBC transfusion. - Recommend PRBC transfusion for hemoglobin <7.0 - Continue to monitor CBC with differential - GI following  History of Antithrombin III  deficiency - Has been on Coumadin .  However INR elevated 5.7 upon admission.  Coumadin  has been on hold. - INR 1.3 today.    Code Status Full  Subjective:  Patient seen laying in bed awake and alert.  Very weak appearing.  Admits to feeling tired however states that he is  breathing better.  Right-sided chest tube intact and draining serosanguineous fluid.  Denies acute pain.  Objective:   Intake/Output Summary (Last 24 hours) at 09/12/2024 0857 Last data filed at 09/12/2024 0831 Gross per 24 hour  Intake 1385 ml  Output 1750 ml  Net -365 ml     PHYSICAL EXAMINATION: ECOG PERFORMANCE STATUS: 3 - Symptomatic, >50% confined to bed  Vitals:   09/12/24 0300 09/12/24 0423  BP: 117/70 121/72  Pulse: 73   Resp: 20   Temp:  98.4 F (36.9 C)  SpO2: 100%    Filed Weights   09/04/24 0912  Weight: 204 lb (92.5 kg)    GENERAL: alert, +chronically weak appearing +fatigued SKIN: skin color, texture, turgor are normal, no rashes or significant lesions EYES: normal, conjunctiva are pink and non-injected, sclera clear OROPHARYNX: no exudate, no erythema and lips, buccal mucosa, and tongue normal  NECK: supple, thyroid  normal size, non-tender, without nodularity LYMPH: no palpable lymphadenopathy in the cervical, axillary or inguinal LUNGS: clear to auscultation and percussion with normal breathing effort HEART: regular rate & rhythm and no murmurs and no lower extremity edema ABDOMEN: abdomen soft, non-tender and normal bowel sounds MUSCULOSKELETAL: no cyanosis of digits and no clubbing  PSYCH: alert & oriented x 3 with fluent speech NEURO: no focal motor/sensory deficits   All questions were answered. The patient knows to call the clinic with any problems, questions or concerns.   I personally spent a total of 40 minutes minutes in the care of the patient today including preparing to see the patient, getting/reviewing separately obtained history, performing a medically appropriate exam/evaluation, referring and communicating with other health care professionals, documenting clinical information  in the EHR, communicating results, and coordinating care.    Norleen ONEIDA Kidney IV, MD 09/12/2024 8:57 AM    Labs Reviewed:  Lab Results  Component Value Date    WBC 11.6 (H) 09/10/2024   HGB 8.6 (L) 09/10/2024   HCT 26.2 (L) 09/10/2024   MCV 92.6 09/10/2024   PLT 391 09/10/2024   Recent Labs    10/24/23 1332 05/13/24 1009 07/18/24 1703 08/19/24 0513 08/20/24 0531 09/04/24 1026 09/04/24 1624 09/05/24 0649 09/08/24 1102 09/08/24 1206 09/09/24 0536 09/10/24 0524  NA 137  --    < > 134* 135 138  --    < > 132* 133* 135 137  K 3.4*  --    < > 4.1 4.2 4.5  --    < > 5.4* 5.3* 4.4 4.0  CL 97  --    < > 98 97* 100  --    < > 101 99 103 104  CO2 33*  --    < > 29 29 26   --    < > 21*  --  25 26  GLUCOSE 100*  --    < > 111* 106* 146*  --    < > 102* 102* 102* 101*  BUN 13  --    < > 6* 6* 18  --    < > 44* 44* 29* 11  CREATININE 1.01  --    < > 0.87 0.95 1.06  --    < > 0.96 1.20 0.78 0.61  CALCIUM  9.3  --    < > 8.8* 9.0 10.1  --    < > 8.1*  --  8.1* 8.2*  GFRNONAA  --   --    < > >60 >60 >60  --    < > >60  --  >60 >60  PROT 7.5 6.9   < > 5.7* 6.2* 7.4 6.9  --   --   --   --   --   ALBUMIN  4.5 4.4   < > 3.2* 3.4* 3.9  --   --   --   --   --   --   AST 17 14   < > 13* 13* 19  --   --   --   --   --   --   ALT 19 22   < > 24 27 20   --   --   --   --   --   --   ALKPHOS 95 85   < > 71 77 108  --   --   --   --   --   --   BILITOT 1.1 1.0   < > 0.7 0.7 0.8  --   --   --   --   --   --   BILIDIR 0.2 0.2  --   --   --   --   --   --   --   --   --   --    < > = values in this interval not displayed.    Studies Reviewed:   DG CHEST PORT 1 VIEW Result Date: 09/11/2024 CLINICAL DATA:  Pleural effusion. EXAM: PORTABLE CHEST 1 VIEW COMPARISON:  09/10/2024 FINDINGS: Right pleural drain again noted with right base collapse/consolidation and probable effusion. Tiny right apical pneumothorax evident. Left lung clear. The cardio pericardial silhouette is enlarged. Telemetry leads overlie the chest. IMPRESSION: 1. Tiny right apical pneumothorax. 2. Persistent  right base collapse/consolidation and probable effusion. Electronically Signed   By: Camellia Candle  M.D.   On: 09/11/2024 07:24   CT HEAD W & WO CONTRAST ( ) Result Date: 09/10/2024 EXAM: CT HEAD WITHOUT AND WITH CONTRAST 09/10/2024 05:59:37 PM TECHNIQUE: CT of the head was performed without and with the administration of 75 mL of iohexol  (OMNIPAQUE ) 300 MG/ML solution. Automated exposure control, iterative reconstruction, and/or weight based adjustment of the mA/kV was utilized to reduce the radiation dose to as low as reasonably achievable. COMPARISON: None available. CLINICAL HISTORY: Brain metastases suspected. FINDINGS: BRAIN AND VENTRICLES: There is a 3.9 x 2.9 x 3.6 cm hemorrhagic focus in the posterior fossa centered within the cerebellar vermis. There is surrounding edema and significant associated mass effect on the fourth ventricle. There is no evidence of significant dilation of the lateral ventricles and third ventricle. Findings are concerning for hemorrhagic mass within the posterior fossa likely in the setting of metastatic disease. Recommend contrast enhanced MRI brain for further evaluation. There are no additional areas of intracranial hemorrhage noted. There are no CT findings to suggest enhancing mass within the supratentorial parenchyma. Evaluation for enhancing mass in the posterior fossa is limited due to the degree of hemorrhage. No significant midline shift. Atherosclerosis of the carotid siphons. No extra-axial fluid collection. No evidence of acute infarct. ORBITS: No acute abnormality. SINUSES AND MASTOIDS: No acute abnormality. SOFT TISSUES AND SKULL: No focal bone lesion. No acute soft tissue abnormality. IMPRESSION: 1. 3.9 cm focus of hemorrhage in the posterior fossa centered within the cerebellar vermis with surrounding edema and significant mass effect on the fourth ventricle. Findings concerning for hemorrhagic mass, in particular metastatic disease. Recommend contrast-enhanced MRI brain for further evaluation. Electronically signed by: Donnice Mania MD 09/10/2024 06:26 PM  EST RP Workstation: [id number]   DG CHEST PORT 1 VIEW Result Date: 09/10/2024 EXAM: 1 VIEW XRAY OF THE CHEST 09/10/2024 04:58:00 AM COMPARISON: Chest yesterday at 09/09/2024 07:07:00 AM. CLINICAL HISTORY: 142230 Pleural effusion 142230 Pleural effusion. FINDINGS: LINES, TUBES AND DEVICES: A pigtail chest tube in the lower right thorax is unchanged in position. Overlying telemetry leads. LUNGS AND PLEURA: Right basilar airspace disease is also improved but not resolved. Left lung is clear. There is chronic vascular prominence of the left hilum. A small right pleural effusion appears improved. No pneumothorax. HEART AND MEDIASTINUM: Mild cardiomegaly with no evidence of CHF. Stable mediastinal configuration. BONES AND SOFT TISSUES: No acute osseous abnormality. IMPRESSION: 1. Small right pleural effusion, improved. 2. Pigtail chest tube in the lower right thorax, unchanged in position. 3. Improved right basilar airspace disease, not resolved. Electronically signed by: Francis Quam MD 09/10/2024 07:36 AM EST RP Workstation: [id number]   DG CHEST PORT 1 VIEW Result Date: 09/09/2024 CLINICAL DATA:  Follow up pleural effusion. EXAM: PORTABLE CHEST 1 VIEW COMPARISON:  Abdominopelvic CT 09/08/2024. Chest radiographs 09/08/2024 and 09/07/2024. FINDINGS: 0707 hours. Small caliber pigtail right chest tube is unchanged in position. Unchanged right pleural effusion and right basilar airspace disease. No evidence of pneumothorax. The left lung is clear. The visualized heart size and mediastinal contours are stable. No acute osseous findings are seen. IMPRESSION: Unchanged right pleural effusion and right basilar airspace disease. No evidence of pneumothorax. Electronically Signed   By: Elsie Perone M.D.   On: 09/09/2024 11:05   CT ANGIO GI BLEED Addendum Date: 09/08/2024 ADDENDUM REPORT: 09/08/2024 06:41 ADDENDUM: Critical Value/emergent results were called by telephone at the time of interpretation on 09/08/2024 at 6:30  am to  provider ISAIAH LEVER , who verbally acknowledged these results. Electronically Signed   By: Camellia Candle M.D.   On: 09/08/2024 06:41   Result Date: 09/08/2024 CLINICAL DATA:  GI bleeding. EXAM: CTA ABDOMEN AND PELVIS WITHOUT AND WITH CONTRAST TECHNIQUE: Multidetector CT imaging of the abdomen and pelvis was performed using the standard protocol during bolus administration of intravenous contrast. Multiplanar reconstructed images and MIPs were obtained and reviewed to evaluate the vascular anatomy. RADIATION DOSE REDUCTION: This exam was performed according to the departmental dose-optimization program which includes automated exposure control, adjustment of the mA and/or kV according to patient size and/or use of iterative reconstruction technique. CONTRAST:  OMNIPAQUE  IOHEXOL  350 MG/ML SOLN COMPARISON:  CT from 08/08/2024 FINDINGS: VASCULAR Aorta: Pre contrast imaging shows no hyperdense crescent in the wall of the thoracic aorta to suggest the presence of an acute intramural hematoma.Normal caliber aorta without aneurysm, dissection, vasculitis or significant stenosis. Celiac: Luminal narrowing and irregularity noted at the origin of the celiac axis. There is a linear filling defect in this region raising concern for focal dissection (sagittal image 103/12 and coronal image 58/11). This finding is stable since prior study 08/08/2024 and is best appreciated when reviewing the study on bone windows. Just distal to the origin, celiac axis dilates up to 13 x 13 mm diameter. Atherosclerotic disease evident. SMA: Patent without evidence of aneurysm, dissection, vasculitis or significant stenosis. Renals: Both renal arteries are patent without evidence of aneurysm, dissection, vasculitis, fibromuscular dysplasia or significant stenosis. IMA: Patent without evidence of aneurysm, dissection, vasculitis or significant stenosis. Inflow: Patent without evidence of aneurysm, dissection, vasculitis or  significant stenosis. Proximal Outflow: Bilateral common femoral and visualized portions of the superficial and profunda femoral arteries are patent without evidence of aneurysm, dissection, vasculitis or significant stenosis. Veins: No obvious venous abnormality within the limitations of this arterial phase study. Review of the MIP images confirms the above findings. NON-VASCULAR Lower chest: Dense consolidation noted right lower lobe with complex fluid in the right pleural space. Pigtail pleural drain evident on the right and gas in the pleural space may be related to this instrumentation. There is right-sided pleural irregular thickening with debris in the right pleural fluid. Hepatobiliary: Multiple hepatic lesions are again noted, similar to prior and likely cyst. Scattered tiny hypodensities in the liver parenchyma are too small to characterize but are statistically most likely benign. No followup imaging is recommended. Gallbladder is nondistended. No intrahepatic or extrahepatic biliary dilation. Pancreas: No focal mass lesion. No dilatation of the main duct. No intraparenchymal cyst. No peripancreatic edema. Spleen: No splenomegaly. No suspicious focal mass lesion. Adrenals/Urinary Tract: Stable thickening of both adrenal glands. Innumerable low-density lesions are seen in both kidneys, most of which are too small to characterize, but statistically most likely benign. No evidence for hydroureter. The urinary bladder appears normal for the degree of distention. Stomach/Bowel: Prominent mural lipoma noted distal antrum, stable. No gastric wall thickening. No evidence of outlet obstruction. There is a tiny focus of contrast extravasation in the duodenal bulb on arterial phase imaging (images 88 and 89 of series 5 and coronal images 57-59 of series 11). No high density material dislocation on precontrast imaging and this finding becomes markedly less apparent on portal venous phase imaging. No evidence for  contrast extravasation into nondilated small bowel loops. Patient is status post subtotal colectomy. There is some prominent mural vascularity in the low rectum on arterial phase imaging raising concern for angio dysplasia although no definite contrast extravasation is  seen in the rectum. Lymphatic: There is no gastrohepatic or hepatoduodenal ligament lymphadenopathy. No retroperitoneal or mesenteric lymphadenopathy. No pelvic sidewall lymphadenopathy. Reproductive: Prostate gland is enlarged. Other: No substantial intraperitoneal free fluid. Musculoskeletal: Midline ventral hernia contains small bowel loops without complicating features, similar to prior. IMPRESSION: 1. Tiny focus of contrast extravasation in the duodenal bulb on arterial phase imaging. This finding becomes markedly less apparent on portal venous phase imaging. Imaging features are compatible with active arterial phase GI bleeding in the duodenal bulb. Patient was noted to have a bleeding ulcer at this location on upper endoscopy of 08/11/2024. 2. Prominent mural vascularity in the low rectum on arterial phase imaging raising concern for angiodysplasia although no definite contrast extravasation is seen in the rectum. 3. Luminal narrowing and irregularity at the origin of the celiac axis with a linear filling defect in this region raising concern for focal dissection. This finding is stable since prior study 08/08/2024 and is best appreciated when reviewing the study on bone windows. Just distal to the origin, celiac axis dilates up to 13 x 13 mm diameter. 4. Dense consolidation right lower lobe with complex fluid in the right pleural space. Pigtail pleural drain evident on the right and gas in the pleural space may be related to this instrumentation. There is right-sided pleural irregular thickening with debris in the right pleural fluid. 5. Midline ventral hernia contains small bowel loops without complicating features, similar to prior.  Electronically Signed: By: Camellia Candle M.D. On: 09/08/2024 06:27   DG CHEST PORT 1 VIEW Result Date: 09/08/2024 CLINICAL DATA:  Pleural effusion. EXAM: PORTABLE CHEST 1 VIEW COMPARISON:  09/07/2024 FINDINGS: Right chest tube remains in place with similar right pleural effusion and right base collapse/consolidation. No discernible pneumothorax. Left lung clear. The cardio pericardial silhouette is enlarged. Telemetry leads overlie the chest. IMPRESSION: Right chest tube remains in place with similar right pleural effusion and right base collapse/consolidation. Electronically Signed   By: Camellia Candle M.D.   On: 09/08/2024 06:01   DG CHEST PORT 1 VIEW Result Date: 09/07/2024 EXAM: 1 VIEW(S) XRAY OF THE CHEST 09/07/2024 05:07:00 AM COMPARISON: Portable chest 09/06/2024 05:29:00 AM. CLINICAL HISTORY: Pleural effusion. FINDINGS: LINES, TUBES AND DEVICES: Pigtail chest tube in the right lower thorax is unchanged. Overlying telemetry leads. LUNGS AND PLEURA: Moderate right pleural fluid continues to be seen but has improved, with atelectasis versus hazy infiltrate in the right mid lung and clear and better aerated right apex. There is minimal left pleural effusion with the left lung otherwise clear. No pneumothorax. HEART AND MEDIASTINUM: No acute abnormality of the cardiac and mediastinal silhouettes. BONES AND SOFT TISSUES: Slight thoracic levoscoliosis. IMPRESSION: 1. Moderate right pleural fluid, improved compared to prior study, with associated atelectasis versus hazy infiltrate in the reaerated right mid lung and clear and better aerated right apex. 2. Pigtail chest tube in the right lower thorax is unchanged. No measurable pneumothorax. 3. Minimal left pleural effusion with the left lung otherwise clear. Electronically signed by: Francis Quam MD 09/07/2024 05:57 AM EST RP Workstation: [id number]   DG CHEST PORT 1 VIEW Result Date: 09/06/2024 EXAM: 1 VIEW(S) XRAY OF THE CHEST 09/06/2024 05:36:00 AM  COMPARISON: 09/05/2024 CLINICAL HISTORY: Pleural effusion. FINDINGS: LINES, TUBES AND DEVICES: Right basilar chest tube stable in place. LUNGS AND PLEURA: There has been mild decreased volume of the right pleural effusion with improved aeration to the right upper lung zone. No pneumothorax identified. Minimal left basilar atelectasis. No focal pulmonary opacity. HEART AND  MEDIASTINUM: No acute abnormality of the cardiac and mediastinal silhouettes. BONES AND SOFT TISSUES: No acute osseous abnormality. IMPRESSION: 1. Mild decreased volume of the right pleural effusion with improved aeration of the right upper lung zone, with right basilar chest tube in stable position. No pneumothorax. Electronically signed by: Waddell Calk MD 09/06/2024 01:16 PM EST RP Workstation: [id number]   DG CHEST PORT 1 VIEW Result Date: 09/05/2024 CLINICAL DATA:  Encounter for chest tube placement. EXAM: PORTABLE CHEST 1 VIEW COMPARISON:  09/05/2024 FINDINGS: Pigtail chest tube placed into the right lower chest region. There is persistent opacification throughout the right hemithorax. Trachea remains midline. Stable fullness in the left hilum that could represent vascular structures. No definite pneumothorax. Cardiac silhouette is stable. IMPRESSION: Placement of right chest tube. Persistent opacification throughout the right hemithorax. Electronically Signed   By: Juliene Balder M.D.   On: 09/05/2024 16:39   DG CHEST PORT 1 VIEW Result Date: 09/05/2024 EXAM: 1 VIEW(S) XRAY OF THE CHEST 09/05/2024 08:19:00 AM COMPARISON: 09/04/2024 CLINICAL HISTORY: Pleural effusion. FINDINGS: LUNGS AND PLEURA: Progressive opacification of right hemithorax with no definite aeration of right lung, likely due to enlarging pleural effusion and compressive atelectasis. No pneumothorax. HEART AND MEDIASTINUM: Enlargement of the left main pulmonary artery compatible with pulmonary artery hypertension. No acute abnormality of the cardiac silhouette. BONES AND  SOFT TISSUES: No acute osseous abnormality. IMPRESSION: 1. Progressive opacification of right hemithorax with no definite aeration of the right lung, likely due to pleural effusion and compressive atelectasis. Electronically signed by: Waddell Calk MD 09/05/2024 10:45 AM EST RP Workstation: [id number]   DG CHEST PORT 1 VIEW Result Date: 09/04/2024 EXAM: 1 VIEW XRAY OF THE CHEST 09/04/2024 04:02:00 PM COMPARISON: 09/04/2024 CLINICAL HISTORY: Status post thoracentesis. left upper lobe lung cancer FINDINGS: LUNGS AND PLEURA: Near complete opacification of the right hemithorax with only a minuscule amount of aerated lung in the right apex. Continued vascular prominence of the left hilum. No pneumothorax. HEART AND MEDIASTINUM: No acute abnormality of the cardiac and mediastinal silhouettes. BONES AND SOFT TISSUES: No acute osseous abnormality. IMPRESSION: 1. Near complete opacification of the right hemithorax with only a minuscule amount of aerated lung in the right apex. 2. Continued vascular prominence of the left hilum. Electronically signed by: Ryan Salvage MD 09/04/2024 05:00 PM EST RP Workstation: [id number]   DG Chest Portable 1 View Result Date: 09/04/2024 EXAM: 1 VIEW(S) XRAY OF THE CHEST 09/04/2024 09:27:55 AM COMPARISON: 08/18/2024 CLINICAL HISTORY: Shortness of breath. FINDINGS: LUNGS AND PLEURA: Large right pleural effusion now resulting in complete opacification of the right hemithorax. No acute radiographic abnormality in the left lung. No pneumothorax. HEART AND MEDIASTINUM: The mediastinum is partially obscured. Subtle rightward mediastinal shift. BONES AND SOFT TISSUES: No acute osseous abnormality. IMPRESSION: 1. Large right pleural effusion resulting in complete opacification of the right hemithorax. 2. No acute radiographic abnormality in the left lung. Electronically signed by: Donnice Mania MD 09/04/2024 10:15 AM EST RP Workstation: [id number]   DG CHEST PORT 1 VIEW Result Date:  08/18/2024 CLINICAL DATA:  Shortness of breath. EXAM: PORTABLE CHEST 1 VIEW COMPARISON:  Chest radiograph dated 08/14/2024. FINDINGS: Interval progression of right pleural effusion with compressive atelectasis of the majority of the right lung. Pneumonia or underlying mass are not excluded. Overall decreased aeration of the right lung. No pneumothorax. Stable cardiac silhouette. No acute osseous pathology. IMPRESSION: Interval progression of right pleural effusion with decreased aeration of the right lung. Electronically Signed   By: Vanetta Shelia HERO.D.  On: 08/18/2024 18:32   DG Chest Port 1 View Result Date: 08/14/2024 EXAM: 1 VIEW(S) XRAY OF THE CHEST 08/14/2024 05:26:00 PM COMPARISON: 08/09/2024 CLINICAL HISTORY: S/P bronchoscopy FINDINGS: LUNGS AND PLEURA: Moderate right pleural effusion with interval increase. Stable right lung base opacity. No pneumothorax. HEART AND MEDIASTINUM: No acute abnormality of the cardiac and mediastinal silhouettes. BONES AND SOFT TISSUES: No acute osseous abnormality. IMPRESSION: 1. Moderate right pleural effusion with interval increase. 2. Stable right lung base opacity likely pneumonia and/or compressive atelectasis . Electronically signed by: Elsie Gravely MD 08/14/2024 05:40 PM EST RP Workstation: [id number]   DG C-ARM BRONCHOSCOPY Result Date: 08/14/2024 C-ARM BRONCHOSCOPY: Fluoroscopy was utilized by the requesting physician.  No radiographic interpretation.     I have read the above note and personally examined the patient. I agree with the assessment and plan as noted above.  Briefly Rick Edwards is a 69 year old male with metastatic lung cancer currently admitted with weakness and shortness of breath/right-sided chest pain was found to have recurrent right pleural effusion as well as a new brain metastasis.  His genetic testing has returned and the patient has a targetable EGFR mutation.  This opens up oral therapies and markedly improves  prognosis.  I spoke with patient last night about the oral Tagrisso  therapy and the risks and benefits.  He voiced his understanding and reported he would be agreeable to pursuing this treatment.  Given his mutation I do believe that his condition does warrant more aggressive management.  I do believe a talc  pleurodesis would be preferred over a PleurX to reduce infection risk during treatment.  Additionally neurosurgical/radiation oncology management of his brain metastasis would be advisable.  Fortunately Tagrisso  has good penetration to the CNS.  Overall despite the patient's weakened condition he is a good candidate for Tagrisso  therapy.   Norleen IVAR Kidney, MD Department of Hematology/Oncology [facility] [facility] Phone: [phone] Pager: [phone] Email: [email or web address]

## 2024-09-11 NOTE — Progress Notes (Signed)
 Brief Pulmonary Note  In light of the finding of the patient's CNS metastasis, we will defer the previously planned pleural intervention / talc  pleurodesis at this time.  Once a plan of care is established with respect to his oncologic care, including this new CNS metastasis, a decision regarding whether to proceed with talc  pleurodesis vs other intervention can be made.  I called the patient's sister, Alisa, this evening to relay this. She was appreciative of the call and update.  My colleague Dr. Annella will be available this week to assist as needed.  Lamar JINNY Dales, MD

## 2024-09-12 ENCOUNTER — Inpatient Hospital Stay (HOSPITAL_COMMUNITY)

## 2024-09-12 ENCOUNTER — Telehealth: Payer: Self-pay

## 2024-09-12 ENCOUNTER — Other Ambulatory Visit (HOSPITAL_COMMUNITY): Payer: Self-pay

## 2024-09-12 ENCOUNTER — Other Ambulatory Visit: Payer: Self-pay | Admitting: Hematology and Oncology

## 2024-09-12 DIAGNOSIS — E43 Unspecified severe protein-calorie malnutrition: Secondary | ICD-10-CM | POA: Insufficient documentation

## 2024-09-12 LAB — PROTIME-INR
INR: 1.2 (ref 0.8–1.2)
Prothrombin Time: 15.6 s — ABNORMAL HIGH (ref 11.4–15.2)

## 2024-09-12 MED ORDER — ORAL CARE MOUTH RINSE: 15.0000 mL | OROMUCOSAL | Status: AC | PRN

## 2024-09-12 MED ORDER — KATE FARMS STANDARD 1.4 PO LIQD
325.0000 mL | Freq: Two times a day (BID) | ORAL | Status: AC
  Administered 2024-09-12: 325 mL via ORAL
  Filled 2024-09-12 (×3): qty 325

## 2024-09-12 MED ORDER — OSIMERTINIB MESYLATE 80 MG PO TABS: 80.0000 mg | ORAL_TABLET | Freq: Every day | ORAL | 0 refills | Status: AC

## 2024-09-12 MED ORDER — GADOBUTROL 1 MMOL/ML IV SOLN
9.0000 mL | Freq: Once | INTRAVENOUS | Status: AC | PRN
  Administered 2024-09-12: 9 mL via INTRAVENOUS

## 2024-09-12 NOTE — Telephone Encounter (Signed)
 Oral Oncology Patient Advocate Encounter   Received notification that prior authorization for Tagrisso  80mg  is required.   PA submitted on 09/12/24 Key [id number] Status is pending     Lucie Lamer, CPhT [place]  [facility] Oncology Pharmacy Patient Advocate Specialist II [facility] [place] Phone: [phone]  Fax: [phone] [email or web address]

## 2024-09-12 NOTE — Progress Notes (Signed)
 " PROGRESS NOTE    Rick Edwards  FMW:[id number] DOB: 1955-11-26 DOA: 09/04/2024 PCP: Joshua Debby CROME, MD    Brief Narrative:   SENON NIXON is a 69 y.o. 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  Recently discharged following thoracentesis.  Since arriving back home his right-sided chest pain and shortness of breath has gradually worsened.  Worse with exertion.  Associated with mild nausea but no vomiting, no fevers.  Additionally with generalized weakness and difficulty performing his ADLs.  No further evidence of GI bleed, dark tarry stools.  Has been compliant with his home medication including Coumadin .  In the ED, temperature 97.3 F, HR 110, RR 25, BP 117/77, O2 to 100% on room air.  WBC 10.5, Huma 12.2, platelet count 513.  Sodium 138, potassium 4.5, chloride 100, CO2 26, glucose 146, BUN 18, creat 1.06.  AST 19, ALT 20, total bilirubin 0.8.  INR 5.7.  Urinalysis unrevealing.  Chest x-ray with large right pleural effusion with complete opacification of the right hemithorax, no acute radiographic abnormality in the left lung.  TRH consulted for admission for recurrent large right malignant pleural effusion.  Assessment & Plan:  Brain metastases with hemorrhage -Patient has been complaining for dizziness off and on for some time and then headache yesterday. Head CT with and without contrast was performed showing cerebellar vermis hemorrhage, most likely underlying metastasis.  Started on Decadron  09/10/2024.  Much brighter .  MRI brain with and without contrast ordered to better define- not yet done as .  Need to discuss his prognosis  with oncology.  I called his sister's number, listed in the chart, and left a voicemail but she never got back to me last night.  My understanding is he has an actionable mutation so is a candidate for oral immunotherapy. Will transfer to [facility] to evaluate options for treating presumed brain met. He is in agreement for transfer to [facility].  Atrial fibrillation with RVR -Started on amiodarone  yesterday due to low blood pressures.-He has converted to normal sinus rhythm early this morning about 5 AM ( 09/11/24).  Not sure he is a great amiodarone  candidate long-term so we will start Lopressor  25 mg p.o. twice daily follow-up. - remains in NSR 09/12/24 am. Continue lopressor  BID. He was on coreg  prior to admit , if BP holds may be able to switchback to coreg .    Large right malignant pleural effusion, recurrent s/p chest tube Right lower lobe pneumonia Patient presents with right-sided chest pain, shortness of breath, generalized weakness.  Recently diagnosed with adenocarcinoma of unknown clear primary following most recent hospitalization with thoracentesis and bronchoscopy.  Imaging previously noted left upper lobe nodule, right renal lesion concerning for RCC versus GI source.  Patient is afebrile.  Chest x-ray with large right pleural effusion with complete opacification of the right hemithorax.  PCCM was consulted and underwent thoracentesis on 09/04/2024 with removal of 900 mL of turbid fluid.  CT angiogram GI bleed study 2/2 with findings of dense consolidation right lower lobe with complex fluid in the right pleural space. -- PCCM following, appreciate assistance -- Chest tube placement pulmonology 1/30 -- Chest tube output: 410 mL past 24h --  Azithromycin  500 mg p.o. daily x 5 days -- Ceftriaxone  2 g IV every 24 hours x 5 days -- Continue supplemental oxygen, maintain SpO2 greater than 92% -- Patient is considering his options which include talc  pleurodesis or PleurX catheter.  He wants to talk it over  with his sister. [facility] will evaluate pro's and con's of how to manage malignant effusion.  Upper GI bleed 2/2 duodenal ulcer, recurrent Recent EGD 08/11/2024 with findings of oozing cratered duodenal ulcer s/p hemostatic spray with resolution of bleeding.  Patient with recurrence of bloody bowel movements 2/2, CT angiogram GI bleed study with findings of extravasation duodenal bulb on arterial phase imaging, prominent mural vascularity low rectum on arterial phase concerning for angiodysplasia.  Patient underwent EGD on 09/08/2024 with findings of LA grade a esophagitis, 1 nonbleeding cratered duodenal ulcer with adherent clot duodenal bulb, gastric lipoma.  -- [facility] following, appreciate assistance -- Hgb 12.2>11.5>11.7>10.6>7.7>7.4>7.3 -- s/p 2u pRBC 2/2 -- repeat tranfusion 1u pRBC today; repeat H&H 2 hours following transfusion -- Clear liquid diet x 2 days -- CBC daily -- If further significant bleeding, GI recommends consideration of IR consult for embolization duodenal bulb  Adenocarcinoma, unclear primary Concern for lung versus GI versus renal etiology.  Follows with medical oncology, Dr. Federico. He had biopsy done via bronchoscopy with pulmonology at [facility] earlier this month. He was scheduled for outpatient PET scan 09/05/2024; but due to recurrent hospitalization will need to reschedule.  Antithrombin III  deficiency Supratherapeutic INR Chronically on Coumadin .  INR elevated 5.7 on admission.  Received 2.5 mg p.o. vitamin K  and 5mg  on 1/30 -- INR 5.7>4.2>2.0>1.2 -- Holding warfarin for now -- INR daily  HTN At baseline on amlodipine  10 mg p.o. daily, Carvedilol  25 mg p.o. twice daily, Spironolactone  25 mg p.o. daily -- Hold antihypertensives given acute blood loss anemia secondary to recurrent GI bleed, hypotension  HLD Peripheral vascular disease -- Atorvastatin  40 mg p.o. daily  CKD stage IIIa -- Cr 1.06>0.92>0.99>0.78, stable  History of Crohn's  disease Follows with [facility] gastroenterology outpatient; Dr. Elicia.  Not on immunosuppressants outpatient  DM2 Diet controlled at baseline.  Last hemoglobin A1c 6.2%.   DVT prophylaxis: Place and maintain sequential compression device Start: 09/08/24 9361 SCDs Start: 09/04/24 1242    Code Status: Full Code Family Communication:  I spoke to his sister, Alisa, 09/12/24 am.  Disposition Plan:  Transfer to [facility] for further workup of brain met.    Consultants:  PCCM Palliative Care [facility]  Procedures:  Right thoracentesis, PCCM 1/29 Chest tube, PCCM 1/30  Antimicrobials:  Azithromycin  2/2>> Ceftriaxone  2/2>>   Subjective: Patient feels better today.  Denies further headache.  Dizziness is  essentially gone.  He looks about the same today as yesterday.  He is oriented x 3 moving all of his extremities to command.  No nystagmus and his extraocular movements are intact.  Objective: Vitals:   09/12/24 0100 09/12/24 0200 09/12/24 0300 09/12/24 0423  BP: 119/72 104/66 117/70 121/72  Pulse: 81 74 73   Resp: 18 17 20    Temp:    98.4 F (36.9 C)  TempSrc:      SpO2: 100% 100% 100%   Weight:      Height:        Intake/Output Summary (Last 24 hours) at 09/12/2024 1101 Last data filed at 09/12/2024 0831 Gross per 24 hour  Intake 1245 ml  Output 1750 ml  Net -505 ml   [employer]  09/04/24 0912  Weight: 92.5 kg    Examination:  Physical Exam: GEN:  alert and oriented x 3, chronically ill appearance, appears older than stated age, cachectic HEENT: NCAT, PERRL, EOMI, sclera clear, MMM PULM: Diminished breath sounds both bases, CTA left chest without wheezing/crackles, normal respiratory effort without accessory muscle use, on room air with SpO2 100% at rest.   CV: RRR w/o M/G/R GI: abd soft, NTND, + BS MSK: no peripheral edema, moves all extremities independently, generally muscle wasted throughout NEURO: No focal neurological deficit PSYCH:  normal mood/affect Integumentary: No concerning rashes/lesions/wounds noted on exposed skin surfaces    Data Reviewed: I have personally reviewed following labs and imaging studies  CBC: Recent Labs  Lab 09/07/24 0626 09/08/24 0144 09/08/24 1102 09/08/24 1206 09/08/24 1540 09/08/24 2131 09/09/24 0536 09/09/24 1456 09/10/24 0524  WBC 11.5* 16.9* 18.5*  --   --   --  15.5*  --  11.6*  HGB 10.1* 7.7* 10.0*   < > 7.7* 7.4* 7.3* 8.0* 8.6*  HCT 29.8* 23.2* 28.9*   < > 23.4* 22.6* 22.2* 23.7* 26.2*  MCV 91.4 90.3 88.4  --   --   --  91.0  --  92.6  PLT 454* 502* 429*  --   --   --  372  --  391   < > = values in this interval not displayed.   Basic Metabolic Panel: Recent Labs  Lab 09/06/24 0538 09/08/24 1102 09/08/24 1206 09/09/24 0536 09/10/24 0524  NA 134* 132* 133* 135 137  K 4.7 5.4* 5.3* 4.4 4.0  CL 98 101 99 103 104  CO2 30 21*  --  25 26  GLUCOSE 139* 102* 102* 102* 101*  BUN 21 44* 44* 29* 11  CREATININE 0.99 0.96 1.20 0.78 0.61  CALCIUM  8.9 8.1*  --  8.1* 8.2*  MG  --   --   --   --  1.7   GFR: Estimated Creatinine Clearance: 102 mL/min (by C-G formula based on SCr of 0.61 mg/dL). Liver Function Tests: No results for input(s): AST, ALT, ALKPHOS, BILITOT, PROT, ALBUMIN  in the last 168 hours.  No results for input(s): LIPASE, AMYLASE in the last 168 hours.  No results for input(s): AMMONIA in the last 168 hours. Coagulation Profile: Recent Labs  Lab 09/06/24 0538 09/08/24 1102 09/09/24 0536 09/10/24 0524 09/11/24 0528  INR 2.0* 1.1 1.2 1.3* 1.3*   Cardiac Enzymes: No results for input(s): CKTOTAL, CKMB, CKMBINDEX, TROPONINI in the last 168 hours. BNP (last 3 results) Recent Labs    08/08/24 1227  PROBNP <50.0   HbA1C: No results for input(s): HGBA1C in the last 72 hours. CBG: No results for input(s): GLUCAP in the last 168 hours. Lipid Profile: No results for input(s): CHOL, HDL, LDLCALC, TRIG,  CHOLHDL, LDLDIRECT in the last 72 hours. Thyroid  Function Tests: No results for input(s): TSH, T4TOTAL, FREET4, T3FREE, THYROIDAB in the last 72 hours. Anemia Panel: No results for input(s): VITAMINB12, FOLATE, FERRITIN, TIBC, IRON, RETICCTPCT in the last 72 hours. Sepsis Labs: No results for input(s): PROCALCITON, LATICACIDVEN in the last 168 hours.  Recent Results (from the past 240 hours)  Body fluid culture w Gram Stain     Status: None   Collection Time: 09/04/24  3:49 PM   Specimen: Pleural Fluid  Result Value Ref Range Status   Specimen Description   Final    PLEURAL Performed at [facility], [address] [place], [place] [address]  Special Requests   Final    NONE Performed at [facility], [address] [place], [place] [address]    Gram Stain NO WBC SEEN NO ORGANISMS SEEN   Final   Culture   Final    NO GROWTH 3 DAYS Performed at [facility], [address], [place], [place] [address]    Report Status 09/08/2024 FINAL  Final         Radiology Studies: DG CHEST PORT 1 VIEW Result Date: 09/11/2024 CLINICAL DATA:  Pleural effusion. EXAM: PORTABLE CHEST 1 VIEW COMPARISON:  09/10/2024 FINDINGS: Right pleural drain again noted with right base collapse/consolidation and probable effusion. Tiny right apical pneumothorax evident. Left lung clear. The cardio pericardial silhouette is enlarged. Telemetry leads overlie the chest. IMPRESSION: 1. Tiny right apical pneumothorax. 2. Persistent right base collapse/consolidation and probable effusion. Electronically Signed   By: Camellia Candle M.D.   On: 09/11/2024 07:24   CT HEAD W & WO CONTRAST ( ) Result Date: 09/10/2024 EXAM: CT HEAD WITHOUT AND WITH CONTRAST 09/10/2024 05:59:37 PM TECHNIQUE: CT of the head was performed without and with the administration of 75 mL of iohexol  (OMNIPAQUE ) 300 MG/ML solution. Automated exposure control, iterative  reconstruction, and/or weight based adjustment of the mA/kV was utilized to reduce the radiation dose to as low as reasonably achievable. COMPARISON: None available. CLINICAL HISTORY: Brain metastases suspected. FINDINGS: BRAIN AND VENTRICLES: There is a 3.9 x 2.9 x 3.6 cm hemorrhagic focus in the posterior fossa centered within the cerebellar vermis. There is surrounding edema and significant associated mass effect on the fourth ventricle. There is no evidence of significant dilation of the lateral ventricles and third ventricle. Findings are concerning for hemorrhagic mass within the posterior fossa likely in the setting of metastatic disease. Recommend contrast enhanced MRI brain for further evaluation. There are no additional areas of intracranial hemorrhage noted. There are no CT findings to suggest enhancing mass within the supratentorial parenchyma. Evaluation for enhancing mass in the posterior fossa is limited due to the degree of hemorrhage. No significant midline shift. Atherosclerosis of the carotid siphons. No extra-axial fluid collection. No evidence of acute infarct. ORBITS: No acute abnormality. SINUSES AND MASTOIDS: No acute abnormality. SOFT TISSUES AND SKULL: No focal bone lesion. No acute soft tissue abnormality. IMPRESSION: 1. 3.9 cm focus of hemorrhage in the posterior fossa centered within the cerebellar vermis with surrounding edema and significant mass effect on the fourth ventricle. Findings concerning for hemorrhagic mass, in particular metastatic disease. Recommend contrast-enhanced MRI brain for further evaluation. Electronically signed by: Donnice Mania MD 09/10/2024 06:26 PM EST RP Workstation: [id number]        Scheduled Meds:  acetaminophen   1,000 mg Oral TID   dexamethasone  (DECADRON ) injection  4 mg Intravenous Q6H   metoprolol  tartrate  25 mg Oral BID   pantoprazole  (PROTONIX ) IV  40 mg Intravenous Q12H   senna  1 tablet Oral QHS   sodium chloride  flush  10 mL  Intrapleural Q6H   Continuous Infusions:     LOS: 8 days    Time spent: 50 minutes    Lonni KANDICE Moose, MD [facility] Available via Epic secure chat 7am-7pm After these hours, please refer to coverage provider listed on [email or web address] 09/12/2024, 11:01 AM   "

## 2024-09-12 NOTE — Progress Notes (Signed)
 Initial Nutrition Assessment  DOCUMENTATION CODES:   Severe malnutrition in context of acute illness/injury  INTERVENTION:   -[place] 1.4 PO BID, each provides 455 kcals and 20g protein  NUTRITION DIAGNOSIS:   Severe Malnutrition related to acute illness, cancer and cancer related treatments as evidenced by severe fat depletion, moderate muscle depletion, percent weight loss.  GOAL:   Patient will meet greater than or equal to 90% of their needs  MONITOR:   PO intake, Supplement acceptance  REASON FOR ASSESSMENT:   Consult Assessment of nutrition requirement/status  ASSESSMENT:   69 y.o. 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  1/29: admitted, s/p thoracentesis, 900 ml yield 1/30: s/p chest tube placement  Patient in room, sleeping in bed, no family present at bedside.  Pt reports having an appetite. Ate 100% of oatmeal with peaches this morning. Denies any issues with swallowing or chewing, no taste changes. Pt's PO intakes have improved since 1/31. Ranging between 75-100% since. Pt was ordered Boost Breeze but pt has been refusing as they make him sick. Willing to try different supplement. States he has some sensitivity to milk, agrees to try plant based shake [employer].  Noted plan to transfer to [facility] today for neurosurgical eval. Pt with new CNS mets.  Admission weight: 204 lbs -pt states this is his UBW -has not been weighed this admission. Weighed patient on bedscale today: ~187 lbs (with blankets, pillows, etc on bed). Per weight records, pt has lost 16 lbs since 1/1 (7% wt loss x 1 month, significant for  time frame).  Medications: Senokot, Octreotide   Labs reviewed.  NUTRITION - FOCUSED PHYSICAL EXAM:  Flowsheet Row Most Recent Value  Orbital Region Severe depletion  Upper Arm Region Moderate depletion  Thoracic and Lumbar Region Moderate depletion  Buccal Region Severe depletion  Temple Region Severe depletion  Clavicle Bone Region Moderate depletion  Clavicle and Acromion Bone Region Moderate depletion  Scapular Bone Region Moderate depletion  Dorsal Hand Moderate depletion  Patellar Region Moderate depletion  Anterior Thigh Region Moderate depletion  Posterior Calf Region Moderate depletion  Edema (RD Assessment) None  Hair Reviewed  Eyes Reviewed  Mouth Reviewed  [poor dentition, missing teeth]  Skin Reviewed  Nails Reviewed    Diet Order:   Diet Order             DIET SOFT Fluid consistency: Thin  Diet effective now                   EDUCATION NEEDS:   Education needs have been addressed  Skin:  Skin Assessment: Reviewed RN Assessment  Last BM:  2/5  Height:   Ht Readings from Last 1 Encounters:  09/04/24 5' 9 (1.753 m)    Weight:   Wt Readings from Last 1 Encounters:  09/04/24 92.5 kg    BMI:  Body mass index is 30.13 kg/m.  Estimated Nutritional Needs:   Kcal:  2200-2400  Protein:  110-125g  Fluid:  2.2L/day   Morna Lee, MS, RD, LDN Inpatient Clinical Dietitian Contact via Secure chat

## 2024-09-12 NOTE — Progress Notes (Signed)
 OT Cancellation Note  Patient Details Name: Rick Edwards MRN: [id number] DOB: 06-16-1956   Cancelled Treatment:    Reason Eval/Treat Not Completed: Other (comment). Pt declined to participate in therapy at 1:30 p.m. due to fatigue and he asked for OT to come back at 3:30. OT checked back on the pt at 3:40 and he was off the unit. OT will follow up another day.   Delanna JINNY Lesches, OTR/L 09/12/2024, 4:27 PM

## 2024-09-12 NOTE — Progress Notes (Addendum)
 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   Patient is poor historian and brother was not present at bedside, majority HPI obtained from prior documentation. Patient was scheduled for PET scan 1/30 and oncology f/u for treatment planning. Patient presented today for right sided chest pain and dyspnea that has gradually worsened since his prior admission. CXR findings w/ a recurrent large right sided pleural effusion w/ complete opacification. Consult was placed for evaluation for pleural effusion management.   Prior moderate effusion on 1/3 was drained 100cc with vagal episode resulting in SBP in 70s. Cytology present for malignant cells, adenocarcinoma and molecular studies completed revealed NSCLC. Pertinent  Medical History    Past Medical History:  Diagnosis Date   Anemia    anemia thrombocytopenia, saw Hematology 2011, can not r/o myeloproliferative d/c   Antithrombin III  deficiency 05/16/2013   On coumadin  for this   Blood clot in vein    in right leg   Chronic kidney disease 05/27/2013   ACUTE RENAL FAILURE    Crohn's disease (HCC)    tx. Imuran    GI bleed 6/11   w/ normal EGD 6/11, 3 PRBCs    Hearing loss    wears hearing aid left ear; birthed with hearing loss   HOH (hard of hearing) 05/16/2013   Hypercalcemia    Hyperkalemia 05/27/2013   Hypertension    Ileostomy in place [facility]) 04-11-13   04-18-13 ileostomy to be taken down.   Perianal abscess 2001   s/p  right hemicolectomy, and drainage of retroperitoneal abcess 2003   Peripheral vascular disease    Transfusion history    last 8'13      Significant Hospital Events: Including procedures, antibiotic start and stop dates in addition to other pertinent events   1/29 admit for SOB; underwent 900 mL thoracentesis of right pleural effusion which yielded dark sanguinous fluid 1/30 emergent placement of 14 Fr pigtail catheter in R midaxillary line for acute hypoxemic respiratory failure / modereate respiratory distress.  Interim History / Subjective:  Chest x-ray with residual small pneumothorax.  Residual mild to moderate pleural effusion.  Objective   Blood pressure 121/72, pulse 73, temperature 98.4 F (36.9 C), resp. rate 20, height 5' 9 (1.753 m), weight 92.5 kg, SpO2 100%.        Intake/Output Summary (Last 24 hours) at 09/12/2024 0912 Last data filed at 09/12/2024 0831 Gross per 24 hour  Intake 1385 ml  Output 1750 ml  Net -365 ml   Filed Weights   09/04/24 0912  Weight: 92.5 kg    Examination: General: In bed no distress HEENT: Traumatic normocephalic CV: Rate and rhythm PULM: Right chest tube in place,, on waterseal with no airleak, diminished in the right base,  Neuro: Does not follow commands, alert, appears encephalopathic  Resolved Hospital Problem list     Assessment & Plan:   Malignant Pleural Effusion Patient found to have malignant pleural effusion during admission 1/3 s/p lung nodule biopsy with cytology findings of unknown  primary adenocarcinoma. Patient also has additional concern for GI mets & renal mass. F/u was scheduled with Oncology and PET scan for 1/30. Prior thoracentesis for moderate pleural effusion completed on 1/3 was drained 100cc with vagal episode with SBP 70s. Molecular pathology revealed TPS 60% indicative of non-small cell lung cancer. S/p additional 900 mL sanguinous drainage via thoracentesis on 1/29 and now 14 Fr chest tube placement on 1/30  which has yielded ongoing significant daily output. Plan: - Chest tube output still over 500 cc a day, given degree of output I think PleurX would be more appropriate -Ongoing small pneumothorax, without position of pleural linings, pleurodesis would not be successful and PleurX would be treatment of choice - Talc  pleurodesis preferred by oncology - Continue chest tube to suction, daily chest x-ray while chest tube in place  Patient is transferred to [facility] for further neurosurgical evaluation given head imaging.  Case discussed in detail with attending physician and oncology team.  Labs   CBC: Recent Labs  Lab 09/07/24 0626 09/08/24 0144 09/08/24 1102 09/08/24 1206 09/08/24 1540 09/08/24 2131 09/09/24 0536 09/09/24 1456 09/10/24 0524  WBC 11.5* 16.9* 18.5*  --   --   --  15.5*  --  11.6*  HGB 10.1* 7.7* 10.0*   < > 7.7* 7.4* 7.3* 8.0* 8.6*  HCT 29.8* 23.2* 28.9*   < > 23.4* 22.6* 22.2* 23.7* 26.2*  MCV 91.4 90.3 88.4  --   --   --  91.0  --  92.6  PLT 454* 502* 429*  --   --   --  372  --  391   < > = values in this interval not displayed.    Basic Metabolic Panel: Recent Labs  Lab 09/06/24 0538 09/08/24 1102 09/08/24 1206 09/09/24 0536 09/10/24 0524  NA 134* 132* 133* 135 137  K 4.7 5.4* 5.3* 4.4 4.0  CL 98 101 99 103 104  CO2 30 21*  --  25 26  GLUCOSE 139* 102* 102* 102* 101*  BUN 21 44* 44* 29* 11  CREATININE 0.99 0.96 1.20 0.78 0.61  CALCIUM  8.9 8.1*  --  8.1* 8.2*  MG  --   --   --   --  1.7   GFR: Estimated Creatinine Clearance: 102 mL/min (by C-G formula based on SCr of 0.61 mg/dL). Recent Labs  Lab 09/08/24 0144 09/08/24 1102 09/09/24 0536 09/10/24 0524  WBC 16.9* 18.5* 15.5* 11.6*    Liver Function Tests: No results for input(s): AST, ALT, ALKPHOS, BILITOT, PROT, ALBUMIN  in the last 168 hours.  No results for input(s): LIPASE, AMYLASE in the last 168 hours.  No results for input(s): AMMONIA in the last 168  hours.  ABG    Component Value Date/Time   PHART 7.506 (H) 05/17/2013 0430   PCO2ART 36.9 05/17/2013 0430   PO2ART 78.4 (L) 05/17/2013 0430   HCO3 29.0 (H) 05/17/2013 0430   TCO2 24 09/08/2024 1206   O2SAT 95.9 05/17/2013 0430     Coagulation Profile: Recent Labs  Lab 09/06/24 0538 09/08/24 1102 09/09/24 0536 09/10/24 0524 09/11/24 0528  INR 2.0* 1.1 1.2 1.3* 1.3*    Cardiac Enzymes: No results for input(s): CKTOTAL, CKMB, CKMBINDEX, TROPONINI in the last 168 hours.  HbA1C: Hgb A1c MFr Bld  Date/Time Value Ref Range Status  05/13/2024 10:09 AM 6.2 4.6 - 6.5 % Final    Comment:    Glycemic Control Guidelines for People with Diabetes:Non Diabetic:  <6%Goal of Therapy: <7%Additional Action Suggested:  >  8%   06/12/2023 10:17 AM 5.9 4.6 - 6.5 % Final    Comment:    Glycemic Control Guidelines for People with Diabetes:Non Diabetic:  <6%Goal of Therapy: <7%Additional Action Suggested:  >8%     CBG: No results for input(s): GLUCAP in the last 168 hours.  Review of Systems:   As above   Past Medical History:  He,  has a past medical history of Anemia, Antithrombin III  deficiency (05/16/2013), Blood clot in vein, Chronic kidney disease (05/27/2013), Crohn's disease (HCC), GI bleed (6/11), Hearing loss, HOH (hard of hearing) (05/16/2013), Hypercalcemia, Hyperkalemia (05/27/2013), Hypertension, Ileostomy in place [facility]) (04-11-13), Perianal abscess (2001), Peripheral vascular disease, and Transfusion history.   Surgical History:   Past Surgical History:  Procedure Laterality Date   Anal fistulotomy  2002   BRONCHIAL NEEDLE ASPIRATION BIOPSY  08/14/2024   Procedure: BRONCHOSCOPY, WITH NEEDLE ASPIRATION BIOPSY;  Surgeon: Zaida Zola SAILOR, MD;  Location: [facility] ENDOSCOPY;  Service: Pulmonary;;   CRYOTHERAPY  08/14/2024   Procedure: CRYOTHERAPY;  Surgeon: Zaida Zola SAILOR, MD;  Location: [facility] ENDOSCOPY;  Service: Pulmonary;;   EMBOLECTOMY  03/12/2012   Procedure: EMBOLECTOMY;   Surgeon: Lonni GORMAN Blade, MD;  Location: [facility] OR;  Service: Vascular;  Laterality: Right;  Right popliteal embolectomy with vein patch angioplasty, right posterior tibial embolectomy with vein patch angioplasty    ESOPHAGOGASTRODUODENOSCOPY N/A 12/12/2023   Procedure: EGD (ESOPHAGOGASTRODUODENOSCOPY);  Surgeon: Burnette Fallow, MD;  Location: [facility];  Service: Gastroenterology;  Laterality: N/A;   ESOPHAGOGASTRODUODENOSCOPY N/A 08/11/2024   Procedure: EGD (ESOPHAGOGASTRODUODENOSCOPY);  Surgeon: Dianna Specking, MD;  Location: [facility] ENDOSCOPY;  Service: Gastroenterology;  Laterality: N/A;   ESOPHAGOGASTRODUODENOSCOPY N/A 09/08/2024   Procedure: EGD (ESOPHAGOGASTRODUODENOSCOPY);  Surgeon: Rosalie Kitchens, MD;  Location: [facility];  Service: Gastroenterology;  Laterality: N/A;   HEMICOLECTOMY  08/29/2001   perforated abscess   HEMOSTASIS CLIP PLACEMENT  08/11/2024   Procedure: CONTROL OF HEMORRHAGE, GI TRACT, ENDOSCOPIC, BY HEMOSTATIC POWDER APPLICATION;  Surgeon: Dianna Specking, MD;  Location: [facility] ENDOSCOPY;  Service: Gastroenterology;;   ILEOSTOMY CLOSURE N/A 04/18/2013   Procedure: ILEOSTOMY TAKEDOWN;  Surgeon: Morene ONEIDA Olives, MD;  Location: [facility] ORS;  Service: General;  Laterality: N/A;   INTRAOPERATIVE ARTERIOGRAM  03/12/2012   Procedure: INTRA OPERATIVE ARTERIOGRAM;  Surgeon: Lonni GORMAN Blade, MD;  Location: [facility] OR;  Service: Vascular;  Laterality: Right;   PARTIAL COLECTOMY  03/11/2012   Procedure: PARTIAL COLECTOMY;  Surgeon: Morene ONEIDA Olives, MD;  Location: [facility] ORS;  Service: General;  Laterality: N/A;  subtotal colectomy transverse and left colon    TRANSMETATARSAL AMPUTATION  05/10/2012   right   UPPER ESOPHAGEAL ENDOSCOPIC ULTRASOUND (EUS) N/A 12/12/2023   Procedure: UPPER ESOPHAGEAL ENDOSCOPIC ULTRASOUND (EUS);  Surgeon: Burnette Fallow, MD;  Location: [facility];  Service: Gastroenterology;  Laterality: N/A;   VIDEO BRONCHOSCOPY WITH ENDOBRONCHIAL NAVIGATION Left 08/14/2024    Procedure: VIDEO BRONCHOSCOPY WITH ENDOBRONCHIAL NAVIGATION;  Surgeon: Zaida Zola SAILOR, MD;  Location: [facility] ENDOSCOPY;  Service: Pulmonary;  Laterality: Left;   VIDEO BRONCHOSCOPY WITH ENDOBRONCHIAL ULTRASOUND  08/14/2024   Procedure: BRONCHOSCOPY, WITH EBUS;  Surgeon: Zaida Zola SAILOR, MD;  Location: [facility] ENDOSCOPY;  Service: Pulmonary;;   VIDEO BRONCHOSCOPY WITH RADIAL ENDOBRONCHIAL ULTRASOUND  08/14/2024   Procedure: VIDEO BRONCHOSCOPY WITH RADIAL ENDOBRONCHIAL ULTRASOUND;  Surgeon: Zaida Zola SAILOR, MD;  Location: [facility] ENDOSCOPY;  Service: Pulmonary;;     Social History:   reports that he quit smoking about 13 years ago. His smoking use included cigarettes. He started smoking about 22 years ago.  He has a 2.3 pack-year smoking history. He has never used smokeless tobacco. He reports that he does not currently use drugs after having used the following drugs: Marijuana. He reports that he does not drink alcohol.   Family History:  His family history includes Heart disease in his father; Hyperlipidemia in his father and mother; Hypertension in his father, mother, and another family member; Stroke in an other family member. There is no history of Coronary artery disease, Diabetes, Colon cancer, Prostate cancer, or Kidney failure.   Allergies Allergies[1]   Home Medications  Prior to Admission medications  Medication Sig Start Date End Date Taking? Authorizing Provider  albuterol  (VENTOLIN  HFA) 108 (90 Base) MCG/ACT inhaler Inhale 1-2 puffs into the lungs every 6 (six) hours as needed for wheezing or shortness of breath. 07/23/24   Reddick, Johnathan B, NP  allopurinol  (ZYLOPRIM ) 300 MG tablet TAKE 1 TABLET BY MOUTH EVERY DAY 07/21/24   Joshua Debby CROME, MD  amLODipine  (NORVASC ) 10 MG tablet TAKE 1 TABLET BY MOUTH EVERYDAY AT BEDTIME 07/21/24   Joshua Debby CROME, MD  atorvastatin  (LIPITOR) 40 MG tablet TAKE 1 TABLET BY MOUTH EVERY DAY 07/11/24   Joshua Debby CROME, MD  carvedilol  (COREG ) 25 MG tablet TAKE 1 TABLET  (25 MG TOTAL) BY MOUTH TWICE A DAY WITH MEALS 08/11/24   Joshua Debby CROME, MD  doxazosin  (CARDURA ) 4 MG tablet Take 1 tablet (4 mg total) by mouth at bedtime. 05/26/24   Joshua Debby CROME, MD  omega-3 acid ethyl esters (LOVAZA ) 1 g capsule Take 2 g by mouth 2 (two) times daily. Patient not taking: Reported on 08/25/2024    [provider]  oxyCODONE  (ROXICODONE ) 5 MG immediate release tablet Take 1 tablet (5 mg total) by mouth every 8 (eight) hours as needed for severe pain (pain score 7-10). 08/28/24 08/28/25  Joshua Debby CROME, MD  spironolactone  (ALDACTONE ) 25 MG tablet Take 1 tablet (25 mg total) by mouth daily. 10/26/23   Joshua Debby CROME, MD  warfarin (COUMADIN ) 5 MG tablet TAKE 1 TABLET BY MOUTH DAILY ON MONDAY, WEDNESDAY, AND FRIDAY. TAKE 1 and 1/2 TABLETS ON ALL OTHER DAYS OR AS DIRECTED BY CLINIC. 08/23/24   Dino Antu, MD           Donnice JONELLE Beals, MD [facility] 09/12/2024, 9:12 AM  Please see [email or web address] for contact info. From 7A-7P if no response, please call [phone]. After hours, please call ELink [phone].          [1]  Allergies Allergen Reactions   Olmesartan      Hx of severe AKI requiring HD on ACE per renal note, hx of AKI and hypotension with olmesartan  07/2023   Mesalamine Rash    REACTION: Rash   "

## 2024-09-12 NOTE — Telephone Encounter (Signed)
 Oral Oncology Pharmacist Encounter  Received new prescription for Tagrisso  (osimertinib ) for the treatment of NSCLC with EGFR G719C, S768I mutation through [facility], planned duration until disease progression or unacceptable toxicity.  Labs from 09/10/24 (CBC and BMP) assessed, no interventions needed. EKG completed on 09/11/24 (Qtc 400). Patient is still inpatient, prescription has not been entered.   Current medication list in Epic reviewed, DDIs with Tagrisso  identified: - atorvastatin  (cat C): tagrisso  may increase the concentration of atorvastatin . Will notify patient to be aware of any increased side effects form the atorvastatin .  -pantoprazole  (currently only during inpatient stay)- will verify upon discharge  Evaluated chart and no patient barriers to medication adherence noted.   Prescription has been e-scribed to the [facility] for benefits analysis and approval.  Oral Oncology Clinic will continue to follow for insurance authorization, copayment issues, initial counseling and start date.  Neola Worrall, PharmD Hematology/Oncology Clinical Pharmacist [facility] [phone] 09/12/2024 9:30 AM

## 2024-09-12 NOTE — Telephone Encounter (Signed)
 Oral Oncology Patient Advocate Encounter  Prior Authorization for Tagrisso  80mg  has been approved.    PA# [id number] Effective dates: 09/11/24 through 09/12/27  Patients co-pay is $45.    Lucie Lamer, CPhT [place]  [facility] Oncology Pharmacy Patient Advocate Specialist [facility] [place] Phone: [phone]  Fax: [phone] [email or web address]

## 2024-09-12 NOTE — Progress Notes (Signed)
 [facility] arrived to unit for patient transport. Bedside report given to transport team. Report called to receiving facility by ongoing RN. Patient vital signs stable at the time of transfer. Patient transferred off unit via stretcher with [facility].

## 2024-09-12 NOTE — Progress Notes (Signed)
 "                                                                                                                                                                                                          Daily Progress Note   Patient Name: Rick Edwards       Date: 09/12/2024 DOB: 06-Oct-1955  Age: 69 y.o. MRN#: [id number] Attending Physician: Maranda Lonni MATSU, MD Primary Care Physician: Joshua Debby CROME, MD Admit Date: 09/04/2024  Reason for Follow-up: Pain control  Patient Profile/HPI:  70 year old male with past medical history significant of Crohn's disease status post ileostomy and takedown, recent admission with bleeding duodenal ulcer and malignant pleural effusion, status post biopsy of lung nodules, pathology positive for adenocarcinoma, possibly metastatic GI or lung cancer, was scheduled for PET scan tomorrow and then to follow-up with oncology for treatment recommendations. He is hard of hearing, presented to the emergency room on 09/04/2024 with complaints of severe right sided chest pain and increasing work of breathing. Workup reveals large right-sided pleural effusion. Palliative consulted for symptom management.   Discussion: Chart reviewed- CT head reviewed- concern for hemorrhagic mass.  Per attending note patient to be transferred to [facility] for neurosurgery followup.  Oncology note reviewed- path of lung cancer shows genetic markers indicating likely good response with targeted therapy Tagresso, which is able to cross to CNS.  Patient endorses no complaints, feels ok. Pain and symptoms are well managed with current interventions.  Brother at bedside with no questions. Reports waiting for transfer to [facility].   Review of Systems  Constitutional:  Positive for malaise/fatigue.     Physical Exam Vitals and nursing note reviewed.  Constitutional:      Comments: thin  Cardiovascular:     Rate and Rhythm: Normal rate.  Pulmonary:     Effort: Pulmonary effort is  normal.  Skin:    General: Skin is warm and dry.  Neurological:     Comments: Hard of hearing             Vital Signs: BP 121/72 (BP Location: Right Arm)   Pulse 73   Temp 98.4 F (36.9 C)   Resp 20   Ht 5' 9 (1.753 m)   Wt 92.5 kg   SpO2 100%   BMI 30.13 kg/m  SpO2: SpO2: 100 % O2 Device: O2 Device: Room Air O2 Flow Rate: O2 Flow Rate (L/min): 4 L/min  Intake/output summary:  Intake/Output Summary (Last 24 hours) at 09/12/2024 1420 Last data filed at 09/12/2024 1200 Gross per 24 hour  Intake 780 ml  Output 950 ml  Net -170 ml   LBM: Last BM Date : 09/11/24 Baseline Weight: Weight: 92.5 kg Most recent weight: Weight: 92.5 kg       Palliative Assessment/Data: PPS: 40%      Patient Active Problem List   Diagnosis Date Noted   Palliative care by specialist 09/07/2024   Hypoxemia 09/05/2024   Recurrent right pleural effusion 09/04/2024   LAD (lymphadenopathy), mediastinal 08/14/2024   Pulmonary nodule 08/13/2024   Pleural effusion 08/13/2024   GIB (gastrointestinal bleeding) 08/08/2024   Pleural effusion on right 07/10/2024   LAFB (left anterior fascicular block) 05/13/2024   Right renal mass 08/24/2023   Microalbuminuria due to type 2 diabetes mellitus (HCC) 06/13/2023   Need for prophylactic vaccination and inoculation against varicella 06/12/2023   Tinea cruris 06/12/2023   BPH without obstruction/lower urinary tract symptoms 03/02/2021   Intrinsic eczema 93/79/7977   LSC (lichen simplex chronicus) 01/18/2021   Chronic gout due to renal impairment of multiple sites without tophus 06/01/2020   Elevated PSA 11/24/2019   Type II diabetes mellitus with manifestations (HCC) 08/17/2018   Atherosclerosis of aorta 09/26/2017   Drug-induced erectile dysfunction 07/19/2016   Pure hypertriglyceridemia 04/05/2016   Hyperlipidemia with target LDL less than 70 01/18/2016   OA (osteoarthritis) of knee 11/25/2013   PVD (peripheral vascular disease) 09/24/2013   Stage  3a chronic kidney disease (HCC) 05/26/2013   Antithrombin III  deficiency 05/16/2013   Anticoagulated on Coumadin  11/27/2012   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) 04/10/2012   Splenic infarction 03/20/2012   CROHN'S DISEASE-LARGE INTESTINE 03/19/2009   Essential hypertension, malignant 03/02/2009    Palliative Care Assessment & Plan    Assessment/Recommendations/Plan  Newly diagnosed lung cancer, malignant pleural effusion, new finding of likely brain mass- per Oncology patient has good prognosis for response to targeted therapy- patient wishes to continue aggressive interventions and cancer directed treatment Symptom management- symptoms currently well controlled with interventions in place PMT will follow intermittently- please call if acute needs arise   Code Status:   Code Status: Full Code   Prognosis:  Unable to determine  Discharge Planning: To Be Determined   Thank you for allowing the Palliative Medicine Team to assist in the care of this patient.  I personally spent a total of 45 minutes in the care of the patient today including preparing to see the patient, getting/reviewing separately obtained history, performing a medically appropriate exam/evaluation, counseling and educating, and documenting clinical information in the EHR.   Cassondra Stain, AGNP-C Palliative Medicine   Please contact Palliative Medicine Team phone at [phone] for questions and concerns.        "

## 2024-09-24 ENCOUNTER — Ambulatory Visit (HOSPITAL_COMMUNITY)

## 2024-11-12 ENCOUNTER — Ambulatory Visit: Admitting: Internal Medicine

## 2024-11-20 ENCOUNTER — Ambulatory Visit (INDEPENDENT_AMBULATORY_CARE_PROVIDER_SITE_OTHER): Admitting: Physician Assistant

## 2025-05-19 ENCOUNTER — Ambulatory Visit

## 2025-05-19 ENCOUNTER — Encounter: Admitting: Internal Medicine

## 2025-06-18 ENCOUNTER — Ambulatory Visit
